# Patient Record
Sex: Male | Born: 1950 | Race: Black or African American | Hispanic: No | Smoking: Former smoker
Health system: Southern US, Community
[De-identification: ages and names within clinical notes are randomized; demographics above are authoritative.]

## PROBLEM LIST (undated history)

## (undated) DIAGNOSIS — IMO0001 Reserved for inherently not codable concepts without codable children: Secondary | ICD-10-CM

## (undated) DIAGNOSIS — G8929 Other chronic pain: Secondary | ICD-10-CM

## (undated) DIAGNOSIS — I739 Peripheral vascular disease, unspecified: Secondary | ICD-10-CM

## (undated) DIAGNOSIS — I428 Other cardiomyopathies: Secondary | ICD-10-CM

## (undated) DIAGNOSIS — J449 Chronic obstructive pulmonary disease, unspecified: Secondary | ICD-10-CM

## (undated) DIAGNOSIS — N183 Chronic kidney disease, stage 3 unspecified: Secondary | ICD-10-CM

## (undated) DIAGNOSIS — G629 Polyneuropathy, unspecified: Secondary | ICD-10-CM

## (undated) DIAGNOSIS — E785 Hyperlipidemia, unspecified: Secondary | ICD-10-CM

## (undated) DIAGNOSIS — R413 Other amnesia: Secondary | ICD-10-CM

## (undated) DIAGNOSIS — M1990 Unspecified osteoarthritis, unspecified site: Secondary | ICD-10-CM

## (undated) DIAGNOSIS — I639 Cerebral infarction, unspecified: Secondary | ICD-10-CM

## (undated) DIAGNOSIS — I10 Essential (primary) hypertension: Secondary | ICD-10-CM

## (undated) DIAGNOSIS — M797 Fibromyalgia: Secondary | ICD-10-CM

## (undated) DIAGNOSIS — I4891 Unspecified atrial fibrillation: Secondary | ICD-10-CM

## (undated) DIAGNOSIS — M109 Gout, unspecified: Secondary | ICD-10-CM

## (undated) DIAGNOSIS — B0230 Zoster ocular disease, unspecified: Secondary | ICD-10-CM

## (undated) DIAGNOSIS — G4730 Sleep apnea, unspecified: Secondary | ICD-10-CM

## (undated) DIAGNOSIS — J45909 Unspecified asthma, uncomplicated: Secondary | ICD-10-CM

## (undated) DIAGNOSIS — K219 Gastro-esophageal reflux disease without esophagitis: Secondary | ICD-10-CM

## (undated) DIAGNOSIS — R531 Weakness: Secondary | ICD-10-CM

## (undated) DIAGNOSIS — E119 Type 2 diabetes mellitus without complications: Secondary | ICD-10-CM

## (undated) DIAGNOSIS — I509 Heart failure, unspecified: Secondary | ICD-10-CM

## (undated) HISTORY — PX: CATARACT EXTRACTION W/ INTRAOCULAR LENS  IMPLANT, BILATERAL: SHX1307

## (undated) HISTORY — PX: JOINT REPLACEMENT: SHX530

## (undated) HISTORY — PX: ORCHIECTOMY: SHX2116

## (undated) HISTORY — PX: TONSILLECTOMY: SUR1361

---

## 2000-06-21 ENCOUNTER — Ambulatory Visit (HOSPITAL_COMMUNITY): Admission: RE | Admit: 2000-06-21 | Discharge: 2000-06-21 | Payer: Self-pay | Admitting: Family Medicine

## 2000-06-21 ENCOUNTER — Encounter: Payer: Self-pay | Admitting: Family Medicine

## 2003-04-20 ENCOUNTER — Emergency Department (HOSPITAL_COMMUNITY): Admission: EM | Admit: 2003-04-20 | Discharge: 2003-04-21 | Payer: Self-pay

## 2006-10-22 HISTORY — PX: CARDIAC CATHETERIZATION: SHX172

## 2013-07-03 ENCOUNTER — Inpatient Hospital Stay (HOSPITAL_COMMUNITY): Payer: Medicare Other

## 2013-07-03 ENCOUNTER — Inpatient Hospital Stay (HOSPITAL_COMMUNITY)
Admission: EM | Admit: 2013-07-03 | Discharge: 2013-07-09 | DRG: 064 | Disposition: A | Discharge status: Skilled Nursing Facility | Payer: Medicare Other | Attending: Internal Medicine | Admitting: Internal Medicine

## 2013-07-03 ENCOUNTER — Emergency Department (HOSPITAL_COMMUNITY): Payer: Medicare Other

## 2013-07-03 ENCOUNTER — Encounter (HOSPITAL_COMMUNITY): Payer: Self-pay | Admitting: Emergency Medicine

## 2013-07-03 DIAGNOSIS — M109 Gout, unspecified: Secondary | ICD-10-CM

## 2013-07-03 DIAGNOSIS — I6350 Cerebral infarction due to unspecified occlusion or stenosis of unspecified cerebral artery: Secondary | ICD-10-CM

## 2013-07-03 DIAGNOSIS — I10 Essential (primary) hypertension: Secondary | ICD-10-CM

## 2013-07-03 DIAGNOSIS — G8929 Other chronic pain: Secondary | ICD-10-CM | POA: Diagnosis present

## 2013-07-03 DIAGNOSIS — R259 Unspecified abnormal involuntary movements: Secondary | ICD-10-CM | POA: Diagnosis present

## 2013-07-03 DIAGNOSIS — G253 Myoclonus: Secondary | ICD-10-CM | POA: Diagnosis present

## 2013-07-03 DIAGNOSIS — IMO0001 Reserved for inherently not codable concepts without codable children: Secondary | ICD-10-CM | POA: Diagnosis present

## 2013-07-03 DIAGNOSIS — R29810 Facial weakness: Secondary | ICD-10-CM | POA: Diagnosis present

## 2013-07-03 DIAGNOSIS — I5033 Acute on chronic diastolic (congestive) heart failure: Secondary | ICD-10-CM

## 2013-07-03 DIAGNOSIS — J4489 Other specified chronic obstructive pulmonary disease: Secondary | ICD-10-CM

## 2013-07-03 DIAGNOSIS — I129 Hypertensive chronic kidney disease with stage 1 through stage 4 chronic kidney disease, or unspecified chronic kidney disease: Secondary | ICD-10-CM | POA: Diagnosis present

## 2013-07-03 DIAGNOSIS — I509 Heart failure, unspecified: Secondary | ICD-10-CM

## 2013-07-03 DIAGNOSIS — J449 Chronic obstructive pulmonary disease, unspecified: Secondary | ICD-10-CM

## 2013-07-03 DIAGNOSIS — I639 Cerebral infarction, unspecified: Secondary | ICD-10-CM

## 2013-07-03 DIAGNOSIS — E119 Type 2 diabetes mellitus without complications: Secondary | ICD-10-CM

## 2013-07-03 DIAGNOSIS — I480 Paroxysmal atrial fibrillation: Secondary | ICD-10-CM | POA: Diagnosis present

## 2013-07-03 DIAGNOSIS — E785 Hyperlipidemia, unspecified: Secondary | ICD-10-CM | POA: Diagnosis present

## 2013-07-03 DIAGNOSIS — M47812 Spondylosis without myelopathy or radiculopathy, cervical region: Secondary | ICD-10-CM | POA: Diagnosis present

## 2013-07-03 DIAGNOSIS — E876 Hypokalemia: Secondary | ICD-10-CM

## 2013-07-03 DIAGNOSIS — M1990 Unspecified osteoarthritis, unspecified site: Secondary | ICD-10-CM | POA: Diagnosis present

## 2013-07-03 DIAGNOSIS — I6340 Cerebral infarction due to embolism of unspecified cerebral artery: Principal | ICD-10-CM | POA: Diagnosis present

## 2013-07-03 DIAGNOSIS — F1910 Other psychoactive substance abuse, uncomplicated: Secondary | ICD-10-CM

## 2013-07-03 DIAGNOSIS — I5022 Chronic systolic (congestive) heart failure: Secondary | ICD-10-CM

## 2013-07-03 DIAGNOSIS — I4891 Unspecified atrial fibrillation: Secondary | ICD-10-CM

## 2013-07-03 DIAGNOSIS — I5023 Acute on chronic systolic (congestive) heart failure: Secondary | ICD-10-CM | POA: Insufficient documentation

## 2013-07-03 DIAGNOSIS — N183 Chronic kidney disease, stage 3 unspecified: Secondary | ICD-10-CM

## 2013-07-03 DIAGNOSIS — E669 Obesity, unspecified: Secondary | ICD-10-CM | POA: Diagnosis present

## 2013-07-03 DIAGNOSIS — I2510 Atherosclerotic heart disease of native coronary artery without angina pectoris: Secondary | ICD-10-CM | POA: Diagnosis present

## 2013-07-03 DIAGNOSIS — Z8249 Family history of ischemic heart disease and other diseases of the circulatory system: Secondary | ICD-10-CM

## 2013-07-03 DIAGNOSIS — R319 Hematuria, unspecified: Secondary | ICD-10-CM | POA: Diagnosis present

## 2013-07-03 DIAGNOSIS — R29898 Other symptoms and signs involving the musculoskeletal system: Secondary | ICD-10-CM | POA: Diagnosis present

## 2013-07-03 DIAGNOSIS — Z833 Family history of diabetes mellitus: Secondary | ICD-10-CM

## 2013-07-03 DIAGNOSIS — N390 Urinary tract infection, site not specified: Secondary | ICD-10-CM | POA: Diagnosis present

## 2013-07-03 DIAGNOSIS — G4733 Obstructive sleep apnea (adult) (pediatric): Secondary | ICD-10-CM | POA: Diagnosis present

## 2013-07-03 DIAGNOSIS — Z8673 Personal history of transient ischemic attack (TIA), and cerebral infarction without residual deficits: Secondary | ICD-10-CM

## 2013-07-03 DIAGNOSIS — I428 Other cardiomyopathies: Secondary | ICD-10-CM

## 2013-07-03 DIAGNOSIS — Z7982 Long term (current) use of aspirin: Secondary | ICD-10-CM

## 2013-07-03 HISTORY — DX: Sleep apnea, unspecified: G47.30

## 2013-07-03 HISTORY — DX: Other chronic pain: G89.29

## 2013-07-03 HISTORY — DX: Chronic kidney disease, stage 3 (moderate): N18.3

## 2013-07-03 HISTORY — DX: Zoster ocular disease, unspecified: B02.30

## 2013-07-03 HISTORY — DX: Gout, unspecified: M10.9

## 2013-07-03 HISTORY — DX: Chronic kidney disease, stage 3 unspecified: N18.30

## 2013-07-03 HISTORY — DX: Chronic obstructive pulmonary disease, unspecified: J44.9

## 2013-07-03 HISTORY — DX: Unspecified atrial fibrillation: I48.91

## 2013-07-03 HISTORY — DX: Other cardiomyopathies: I42.8

## 2013-07-03 HISTORY — DX: Fibromyalgia: M79.7

## 2013-07-03 HISTORY — DX: Hyperlipidemia, unspecified: E78.5

## 2013-07-03 HISTORY — DX: Essential (primary) hypertension: I10

## 2013-07-03 HISTORY — DX: Heart failure, unspecified: I50.9

## 2013-07-03 HISTORY — DX: Unspecified osteoarthritis, unspecified site: M19.90

## 2013-07-03 HISTORY — DX: Polyneuropathy, unspecified: G62.9

## 2013-07-03 LAB — COMPREHENSIVE METABOLIC PANEL
Albumin: 3 g/dL — ABNORMAL LOW (ref 3.5–5.2)
Alkaline Phosphatase: 120 U/L — ABNORMAL HIGH (ref 39–117)
BUN: 25 mg/dL — ABNORMAL HIGH (ref 6–23)
Calcium: 9.9 mg/dL (ref 8.4–10.5)
GFR calc Af Amer: 52 mL/min — ABNORMAL LOW (ref >90)
Potassium: 3.4 mEq/L — ABNORMAL LOW (ref 3.5–5.1)
Sodium: 134 mEq/L — ABNORMAL LOW (ref 135–145)
Total Protein: 7.6 g/dL (ref 6.0–8.3)

## 2013-07-03 LAB — POCT I-STAT TROPONIN I: Troponin i, poc: 0.02 ng/mL (ref 0.00–0.08)

## 2013-07-03 LAB — URINALYSIS, ROUTINE W REFLEX MICROSCOPIC
Bilirubin Urine: NEGATIVE
Glucose, UA: NEGATIVE mg/dL
Hgb urine dipstick: NEGATIVE
Ketones, ur: NEGATIVE mg/dL
Leukocytes, UA: NEGATIVE
Protein, ur: NEGATIVE mg/dL
pH: 7 (ref 5.0–8.0)

## 2013-07-03 LAB — POCT I-STAT, CHEM 8
BUN: 29 mg/dL — ABNORMAL HIGH (ref 6–23)
Calcium, Ion: 0.98 mmol/L — ABNORMAL LOW (ref 1.13–1.30)
Chloride: 99 mEq/L (ref 96–112)
Creatinine, Ser: 1.7 mg/dL — ABNORMAL HIGH (ref 0.50–1.35)
Glucose, Bld: 120 mg/dL — ABNORMAL HIGH (ref 70–99)
TCO2: 23 mmol/L (ref 0–100)

## 2013-07-03 LAB — CBC
MCH: 27.7 pg (ref 26.0–34.0)
MCHC: 33.7 g/dL (ref 30.0–36.0)
MCV: 82.2 fL (ref 78.0–100.0)
Platelets: 322 10*3/uL (ref 150–400)

## 2013-07-03 LAB — GLUCOSE, CAPILLARY
Glucose-Capillary: 122 mg/dL — ABNORMAL HIGH (ref 70–99)
Glucose-Capillary: 108 mg/dL — ABNORMAL HIGH (ref 70–99)
Glucose-Capillary: 103 mg/dL — ABNORMAL HIGH (ref 70–99)

## 2013-07-03 LAB — DIFFERENTIAL
Basophils Relative: 1 % (ref 0–1)
Eosinophils Absolute: 0.1 10*3/uL (ref 0.0–0.7)
Eosinophils Relative: 1 % (ref 0–5)
Neutrophils Relative %: 73 % (ref 43–77)

## 2013-07-03 LAB — PROTIME-INR
INR: 1.23 (ref 0.00–1.49)
Prothrombin Time: 15.2 seconds (ref 11.6–15.2)

## 2013-07-03 LAB — RAPID URINE DRUG SCREEN, HOSP PERFORMED
Amphetamines: NOT DETECTED
Benzodiazepines: NOT DETECTED
Cocaine: NOT DETECTED

## 2013-07-03 LAB — ETHANOL: Alcohol, Ethyl (B): <11 mg/dL (ref 0–11)

## 2013-07-03 LAB — TROPONIN I: Troponin I: <0.3 ng/mL (ref <0.30)

## 2013-07-03 MED ORDER — FUROSEMIDE 40 MG PO TABS: 40.0000 mg | ORAL_TABLET | Freq: Every day | ORAL | Status: DC

## 2013-07-03 MED ORDER — SENNOSIDES-DOCUSATE SODIUM 8.6-50 MG PO TABS
2.0000 | ORAL_TABLET | Freq: Every day | ORAL | Status: DC
  Administered 2013-07-04: 2 via ORAL
  Filled 2013-07-03: qty 2

## 2013-07-03 MED ORDER — PREGABALIN 75 MG PO CAPS
75.0000 mg | ORAL_CAPSULE | Freq: Two times a day (BID) | ORAL | Status: DC
  Administered 2013-07-04 – 2013-07-08 (×5): 75 mg via ORAL
  Filled 2013-07-03 (×8): qty 1

## 2013-07-03 MED ORDER — ACETAMINOPHEN 650 MG RE SUPP: 650.0000 mg | RECTAL | Status: DC | PRN

## 2013-07-03 MED ORDER — GABAPENTIN 300 MG PO CAPS
300.0000 mg | ORAL_CAPSULE | Freq: Two times a day (BID) | ORAL | Status: DC
  Filled 2013-07-03 (×4): qty 1

## 2013-07-03 MED ORDER — OXYCODONE HCL 10 MG PO TB12: 10.0000 mg | ORAL_TABLET | Freq: Two times a day (BID) | ORAL | Status: DC

## 2013-07-03 MED ORDER — ALLOPURINOL 100 MG PO TABS
100.0000 mg | ORAL_TABLET | Freq: Every day | ORAL | Status: DC
  Administered 2013-07-04 – 2013-07-09 (×6): 100 mg via ORAL
  Filled 2013-07-03 (×7): qty 1

## 2013-07-03 MED ORDER — SENNOSIDES-DOCUSATE SODIUM 8.6-50 MG PO TABS: 2.0000 | ORAL_TABLET | Freq: Every day | ORAL | Status: DC

## 2013-07-03 MED ORDER — CARVEDILOL 25 MG PO TABS
25.0000 mg | ORAL_TABLET | Freq: Two times a day (BID) | ORAL | Status: DC
  Administered 2013-07-04 – 2013-07-09 (×10): 25 mg via ORAL
  Filled 2013-07-03 (×14): qty 1

## 2013-07-03 MED ORDER — INSULIN ASPART 100 UNIT/ML ~~LOC~~ SOLN
0.0000 [IU] | Freq: Three times a day (TID) | SUBCUTANEOUS | Status: DC
  Administered 2013-07-04 – 2013-07-05 (×3): 1 [IU] via SUBCUTANEOUS
  Administered 2013-07-05: 2 [IU] via SUBCUTANEOUS
  Administered 2013-07-05: 1 [IU] via SUBCUTANEOUS
  Administered 2013-07-06: 2 [IU] via SUBCUTANEOUS
  Administered 2013-07-06 – 2013-07-07 (×2): 1 [IU] via SUBCUTANEOUS
  Administered 2013-07-07: 2 [IU] via SUBCUTANEOUS
  Administered 2013-07-07: 3 [IU] via SUBCUTANEOUS
  Administered 2013-07-08: 2 [IU] via SUBCUTANEOUS
  Administered 2013-07-08: 1 [IU] via SUBCUTANEOUS
  Administered 2013-07-08: 2 [IU] via SUBCUTANEOUS
  Administered 2013-07-09: 1 [IU] via SUBCUTANEOUS

## 2013-07-03 MED ORDER — INSULIN ASPART 100 UNIT/ML ~~LOC~~ SOLN: 0.0000 [IU] | Freq: Every day | SUBCUTANEOUS | Status: DC

## 2013-07-03 MED ORDER — METOLAZONE 5 MG PO TABS: 5.0000 mg | ORAL_TABLET | Freq: Every day | ORAL | Status: DC

## 2013-07-03 MED ORDER — FUROSEMIDE 40 MG PO TABS
40.0000 mg | ORAL_TABLET | Freq: Every day | ORAL | Status: DC
  Administered 2013-07-04 – 2013-07-05 (×2): 40 mg via ORAL
  Filled 2013-07-03 (×4): qty 1

## 2013-07-03 MED ORDER — ASPIRIN 325 MG PO TABS
325.0000 mg | ORAL_TABLET | Freq: Every day | ORAL | Status: DC
  Administered 2013-07-04 – 2013-07-09 (×6): 325 mg via ORAL
  Filled 2013-07-03 (×6): qty 1

## 2013-07-03 MED ORDER — LEVALBUTEROL HCL 0.63 MG/3ML IN NEBU
0.6300 mg | INHALATION_SOLUTION | RESPIRATORY_TRACT | Status: DC | PRN
  Administered 2013-07-04 – 2013-07-05 (×3): 0.63 mg via RESPIRATORY_TRACT
  Filled 2013-07-03 (×4): qty 3

## 2013-07-03 MED ORDER — POTASSIUM CHLORIDE CRYS ER 20 MEQ PO TBCR
40.0000 meq | EXTENDED_RELEASE_TABLET | Freq: Once | ORAL | Status: DC
  Filled 2013-07-03: qty 2

## 2013-07-03 MED ORDER — OXYCODONE HCL ER 10 MG PO T12A: 10.0000 mg | EXTENDED_RELEASE_TABLET | Freq: Two times a day (BID) | ORAL | Status: DC

## 2013-07-03 MED ORDER — ISOSORBIDE DINITRATE 20 MG PO TABS
20.0000 mg | ORAL_TABLET | Freq: Every day | ORAL | Status: DC
  Administered 2013-07-04 – 2013-07-09 (×6): 20 mg via ORAL
  Filled 2013-07-03 (×7): qty 1

## 2013-07-03 MED ORDER — ACETAMINOPHEN 325 MG PO TABS
650.0000 mg | ORAL_TABLET | ORAL | Status: DC | PRN
  Administered 2013-07-06 – 2013-07-09 (×4): 650 mg via ORAL
  Filled 2013-07-03: qty 2
  Filled 2013-07-03: qty 1
  Filled 2013-07-03 (×3): qty 2

## 2013-07-03 MED ORDER — METOLAZONE 5 MG PO TABS
5.0000 mg | ORAL_TABLET | Freq: Every day | ORAL | Status: DC
  Administered 2013-07-04 – 2013-07-05 (×2): 5 mg via ORAL
  Filled 2013-07-03 (×4): qty 1

## 2013-07-03 MED ORDER — IOHEXOL 350 MG/ML SOLN
100.0000 mL | Freq: Once | INTRAVENOUS | Status: AC | PRN
  Administered 2013-07-03: 100 mL via INTRAVENOUS

## 2013-07-03 MED ORDER — ENOXAPARIN SODIUM 30 MG/0.3ML ~~LOC~~ SOLN
30.0000 mg | SUBCUTANEOUS | Status: DC
Start: 2013-07-03 — End: 2013-07-05
  Administered 2013-07-03 – 2013-07-04 (×2): 30 mg via SUBCUTANEOUS
  Filled 2013-07-03 (×3): qty 0.3

## 2013-07-03 NOTE — ED Notes (Signed)
CBG 122 

## 2013-07-03 NOTE — Code Documentation (Signed)
62 yo male arriving to [facility] by [facility] at 0902.  Code stroke called at 0901.  Patient lives at [facility] d/t CHF and is wheelchair bound per staff, but does use a walker with therapy.  Per nursing home staff, patient was seen at 0630 this morning by a RT who gave him a breathing treatment and thought him to be at his baseline at that time.  Patient was seen again at 0845 when he had difficulty breathing and dropped his oxygen saturations, requiring oxygen.  Staff reports that he had slurred speech and left sided weakness at this time.  He was clammy and CBG was 97.  EMS called and patient transported to the hospital.  Patient reports that he went to bed at midnight, therefore, LKW 8/12 0001.  EDP exam/cleared for CT at 0906, Stroke team arrived at 0904, and neurologist arrived at 0908.  CT showed old L brain strokes, and ill defined hypodensity and edema in the R insular cortex.  NIHSS 8, see documentation for details.  Due to unclear LKW, patient to CTA-CTP at 0945. Awaiting interpretation.  Family updated at bedside. Handoff with ED RN Wes at bedside.

## 2013-07-03 NOTE — Consult Note (Signed)
Neurology Consultation Reason for Consult: Stroke Referring Physician: Merlene Laughter  CC: Left sided weakness  History is obtained from:Patient, nursing home staff  HPI: Frank Davidson is a 62 y.o. male with a history of severe CHF who went to bed normal. He reports waking up normal around 6:30 am, however this is not clear given that he denies his current deficits as well. He had a breathing treatment this morning, though it is unclear how much assessment was actually done at that time and igven that his predominant symptoms are in his leg and he does not recognize his deficits, I do not feel that this is a reliable time of onset. Later, it was noted that he had a left facial droop and weakness.   Speaking with his daughter, he was having some problems with his left leg even prior to todays event and it is unclear if this was weakness or pain.   LKW: 12 midnight tpa given: no, outside of window.  MRS: 4   ROS: A 14 point ROS was performed and is negative except as noted in the HPI.  Past Medical History  Diagnosis Date  . Fibromyalgia   . Atrial fibrillation   . Hypertension   . Osteoarthritis   . COPD (chronic obstructive pulmonary disease)   . Sleep apnea with use of continuous positive airway pressure (CPAP)   . Diabetes mellitus without complication   . Coronary artery disease   . Neuropathy   . Ocular herpes   . Gout   . CKD (chronic kidney disease), stage III   . CHF (congestive heart failure)     EF 15%  . Polysubstance abuse     Hx of  . Chronic pain   . Hyperlipidemia   . Nonischemic cardiomyopathy     Social History: Tob: neg  Exam: Current vital signs: There were no vitals filed for this visit.  Vital signs in last 24 hours:    General: in bed CV: mildly irregular Mental Status: Patient is awake, alert, oriented to person, place, month, year, and situation. He does not recognize his deficits. No aphasia. Cranial Nerves: II: Visual Fields are full.  Pupils are equal, round, and reactive to light.  Discs are difficult to visualize. III,IV, VI: Able to cross midline, but mild restriction in leftward gaze.   V: Facial sensation is symmetric to temperature VII: Facial movement is notable for facial droop VIII: hearing is intact to voice X: Uvula elevates symmetrically XI: Shoulder shrug is symmetric. XII: tongue is midline without atrophy or fasciculations.  Motor: Tone is normal. Bulk is normal. 5/5 strength was present on rthe right, on the left, 3/5 in arm, 1/5 in leg.  Sensory: Sensation is symmetric to light touch and temperature in the arms and legs, but extiguishes to double stim.  Deep Tendon Reflexes: 2+ and symmetric in the biceps and patellae.  Cerebellar: FNF and HKS are intact on right Gait: Unable to test due to weakness.    I have reviewed labs in epic and the results pertinent to this consultation are: Cr 1.7  I have reviewed the images obtained:CTP - some penumbra, but significant infarct as well.   Impression: 62 yo M With right MCA infarct. In the setting of his comorbiditties, I feel that the risks of intervention outweigh the expected benefits based on his CTP, and therefore have no acute interventions to offer. He will need a stroke workup, but I feel this is most likely cardioembolic from his very low  EF.   Recommendations: 1. HgbA1c, fasting lipid panel 2. MRI, MRA  of the brain without contrast 3. Frequent neuro checks 4. Echocardiogram 5. Carotid dopplers 6. Prophylactic therapy-Antiplatelet med: Aspirin - dose 325mg  7. Risk factor modification 8. Telemetry monitoring 9. PT consult, OT consult, Speech consult    This patient is critically ill and at significant risk of neurological worsening, death and care requires constant monitoring of vital signs, hemodynamics,respiratory and cardiac monitoring, neurological assessment, discussion with family, other specialists and medical decision making of high  complexity. I spent 65 minutes of neurocritical care time  in the care of  this patient.  Ritta Slot, MD [facility] [phone]  If 7pm- 7am, please page neurology on call at [phone].  07/03/2013  11:19 AM

## 2013-07-03 NOTE — H&P (Addendum)
[facility] History and Physical  Frank Davidson IHK:[id number] DOB: 1951/02/02 DOA: 07/03/2013  Referring physician: Dr Frank Davidson PCP: Frank Housekeeper, MD  Specialists:   Chief Complaint:   HPI: Frank Davidson is a 62 y.o. 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 Sided weakness and facial droop. He is a [employer] and it was initially reported that when the RT at the facility gave him a breathing treatment at 6:30am today that he was at his 'baseline'...but that RT unable to say whether or not he was able to move his L. Side at that time and pt in unable to tell whether or not he had any weakness or numbness. It was later on this am when another staff saw him that it was noted that he had a left droop and was also weak on the L. Side and so EMS was called. He denies headaches, parasthesias, slurred speech, dysphagia and no changes in his vision. Upon arrival in the ED code stroke was called>>he was seen by Neuro and CT head  ill-defined hypodensity and edema in the right insular cortex compatible with acute infarct. Negative for hemorrhage.CT perfusion  Was done and suggests right MCA infarct core centered at the insula/operculum     Review of Systems: The patient denies anorexia, fever, weight loss,, vision loss, decreased hearing, hoarseness, chest pain, syncope, dyspnea on exertion, peripheral edema, hemoptysis, abdominal pain, melena, hematochezia, severe indigestion/heartburn, hematuria, incontinence, transient blindness, depression, unusual weight change, abnormal bleeding.    Past Medical History  Diagnosis Date  . Fibromyalgia   . Atrial fibrillation   . Hypertension   . Osteoarthritis   . COPD (chronic obstructive pulmonary disease)   . Sleep apnea with use of continuous positive airway pressure (CPAP)   . Diabetes mellitus without complication   . Coronary artery disease   . Neuropathy   .  Ocular herpes   . Gout   . CKD (chronic kidney disease), stage III   . CHF (congestive heart failure)     EF 15%  . Polysubstance abuse     Hx of  . Chronic pain   . Hyperlipidemia   . Nonischemic cardiomyopathy    Past Surgical History  Procedure Laterality Date  . 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 Cardiac catheterization  10/2006    normal coronary arteries   Social History:  reports that he has never smoked. He does not have any smokeless tobacco history on file. He reports that he does not drink alcohol or use illicit drugs.  where does patient live--home  Allergies  Allergen Reactions  . Actos (Pioglitazone)     Family history: Sister with HTN and DM  Prior to Admission medications   Medication Sig Start Date End Date Taking? Authorizing Provider  allopurinol (ZYLOPRIM) 100 MG tablet Take 100 mg by mouth daily.   Yes Historical Provider, MD  aspirin 325 MG tablet Take 325 mg by mouth daily.   Yes Historical Provider, MD  carvedilol (COREG) 25 MG tablet Take 25 mg by mouth 2 (two) times daily with a meal.   Yes Historical Provider, MD  furosemide (LASIX) 40 MG tablet Take 40 mg by mouth daily.   Yes Historical Provider, MD  gabapentin (NEURONTIN) 300 MG capsule Take 300 mg by mouth 2 (two) times daily.   Yes Historical Provider, MD  glipiZIDE (GLUCOTROL) 10 MG tablet Take 10 mg by mouth 2 (two) times  daily before a meal.   Yes Historical Provider, MD  isosorbide dinitrate (ISORDIL) 20 MG tablet Take 20 mg by mouth daily.   Yes Historical Provider, MD  metolazone (ZAROXOLYN) 5 MG tablet Take 5 mg by mouth daily.   Yes Historical Provider, MD  oxyCODONE (OXYCONTIN) 10 MG 12 hr tablet Take 10 mg by mouth every 12 (twelve) hours.   Yes Historical Provider, MD  pregabalin (LYRICA) 75 MG capsule Take 75 mg by mouth 2 (two) times daily.   Yes Historical Provider, MD  senna-docusate (SENOKOT-S) 8.6-50 MG per tablet Take 2 tablets by mouth daily.   Yes Historical Provider, MD    Physical Exam: Filed Vitals:   07/03/13 1130  BP: 130/80  Pulse: 65  Resp: 18    Constitutional: Vital signs reviewed.  Patient is a well-developed and well-nourished in no acute distress and cooperative with exam. Alert and oriented x1. L. Facial droop Head: Normocephalic and atraumatic Nose: No erythema or drainage noted.   Mouth: no erythema or exudates, MMM Eyes: PERRL, EOMI, conjunctivae normal, No scleral icterus.  Neck: Supple, Trachea midline normal ROM, No JVD, mass, thyromegaly, or carotid bruit present.  Cardiovascular: Irreg, irreg, S1 normal, S2 normal, no MRG, pulses symmetric and intact bilaterally Pulmonary/Chest: normal respiratory effort, CTAB, no wheezes, rales, or rhonchi Abdominal: Soft. Non-tender, non-distended, bowel sounds are normal, no masses, organomegaly, or guarding present.  GU: no CVA tenderness Extremities:No cyanosis and no edema Neurological: A&O x1, LUE strength 3/5, LLE 1/5. R.sided strenght 5/5, cranial nerve II-XII are grossly intact, sensory grossly intact.   Skin: Warm, dry and intact. No rash, cyanosis, or clubbing.  Psychiatric: Normal mood and affect.     Labs on Admission:  Basic Metabolic Panel:  Recent Labs Lab 07/03/13 0924 07/03/13 0927  NA 134* 134*  K 4.3 3.4*  CL 99 94*  CO2  --  28  GLUCOSE 120* 122*  BUN 29* 25*  CREATININE 1.70* 1.59*  CALCIUM  --  9.9   Liver Function Tests:  Recent Labs Lab 07/03/13 0927  AST 33  ALT 26  ALKPHOS 120*  BILITOT 0.9  PROT 7.6  ALBUMIN 3.0*   No results found for this basename: LIPASE, AMYLASE,  in the last 168 hours No results found for this basename: AMMONIA,  in the last 168 hours CBC:  Recent Labs Lab 07/03/13 0924 07/03/13 0927  WBC  --  5.8  NEUTROABS  --  4.2  HGB 14.3 12.0*  HCT 42.0 35.6*  MCV  --  82.2  PLT  --  322   Cardiac Enzymes:  Recent Labs Lab 07/03/13 0933  TROPONINI <0.30    BNP (last 3 results) No results found for this basename:  PROBNP,  in the last 8760 hours CBG:  Recent Labs Lab 07/03/13 1036  GLUCAP 122*    Radiological Exams on Admission: Ct Head Wo Contrast  07/03/2013   *RADIOLOGY REPORT*  Clinical Data: Code stroke.  Left facial droop  CT HEAD WITHOUT CONTRAST  Technique:  Contiguous axial images were obtained from the base of the skull through the vertex without contrast.  Comparison: None.  Findings: Poorly defined hypodensity in the right insula, consistent with acute infarct in the right MCA territory.  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 Chronic infarct in the posterior internal capsule on the left.  Age appropriate atrophy.  Negative for hydrocephalus.  Negative for hemorrhage or mass.  IMPRESSION: Ill-defined hypodensity and edema in the right  insular cortex compatible with acute infarct.  Negative for hemorrhage.  Critical Value/emergent results were called by telephone at the time of interpretation on 07/03/2013 at 9:36 hours to Dr. Petra Kuba, who verbally acknowledged these results.   Original Report Authenticated By: Janeece Riggers, M.D.    Assessment/Plan Present on Admission:  . CVA (cerebral infarction) - As discussed above, will admit for CVA work up - continue ASA as per neuro recs -obtain MRI/MRA/carotid dopplers, A1C, FLP and follow -appreciate Neuro assistance, following . A-fib -continue coreg and ASA -Neuro to follow for further recs on anticoagulation -rate controlled, follow . Nonischemic cardiomyopathy/CHF -compensated, follow and resume diuretics in am -continue coreg, follow up on echo for ef . Hypertension -monitor and allow for permissive HTN in first couple of days . COPD (chronic obstructive pulmonary disease) -stable, prn nebs . CKD (chronic kidney disease), stage III -follow and recheck . Diabetes mellitus without complication -monitor accuchecks and cover with SSI for now . Hypokalemia -replace k      Code Status: full Family  Communication: niece at bedside Disposition Plan: admit to neuro tele  Time spent: >30  Kela Millin [facility] Pager [phone]  If 7PM-7AM, please contact night-coverage [email or web address] Password [facility] 07/03/2013, 11:46 AM

## 2013-07-03 NOTE — ED Notes (Signed)
Per report from [facility] pt is a resident at [facility].  Pt reportedly received a breathing tx at the facility this am at 0630.  The resp therapist at the facility reports that the pt appeared normal this am.  He is noted to have L sided weakness in the upper and lower extremity weakness with L sided facial droop.  Staff reported that the patient was noted to have a respiratory event around 0815 in which he became diaphoretic and weak.  Staff also reported a normal EKG.  Pt is alert and oriented.

## 2013-07-03 NOTE — Progress Notes (Signed)
Dr. Donna Bernard notified of continued npo status and inability to give po meds. Will monitor.

## 2013-07-03 NOTE — Evaluation (Signed)
Clinical/Bedside Swallow Evaluation Patient Details  Name: Frank Davidson MRN: [id number] Date of Birth: 1951/07/10  Today's Date: 07/03/2013 Time: 1530-1610 SLP Time Calculation (min): 40 min  Past Medical History:  Past Medical History  Diagnosis Date  . Fibromyalgia   . Atrial fibrillation   . Hypertension   . Osteoarthritis   . COPD (chronic obstructive pulmonary disease)   . Sleep apnea with use of continuous positive airway pressure (CPAP)   . Diabetes mellitus without complication   . Coronary artery disease   . Neuropathy   . Ocular herpes   . Gout   . CKD (chronic kidney disease), stage III   . CHF (congestive heart failure)     EF 15%  . Polysubstance abuse     Hx of  . Chronic pain   . Hyperlipidemia   . Nonischemic cardiomyopathy    Past Surgical History:  Past Surgical History  Procedure Laterality Date  . 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 Cardiac catheterization  10/2006    normal coronary arteries   HPI:  Frank Davidson is a 62 y.o. 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 Sided weakness and facial droop. He is a [employer] and it was initially reported that when the RT at the facility gave him a breathing treatment at 6:30am today that he was at his 'baseline'...but that RT unable to say whether or not he was able to move his L. Side at that time and pt in unable to tell whether or not he had any weakness or numbness. It was later on this am when another staff saw him that it was noted that he had a left droop and was also weak on the L. Side and so EMS was called. He denies headaches, parasthesias, slurred speech, dysphagia and no changes in his vision. Upon arrival in the ED code stroke was called>>he was seen by Neuro and CT head  ill-defined hypodensity and edema in the right insular cortex compatible with acute infarct. Negative for hemorrhage.CT perfusion  Was done and  suggests right MCA infarct core centered at the insula/operculum   Assessment / Plan / Recommendation Clinical Impression  Pt. is very sleepy.  Will awaken briefly and accepts po's by spoon, but has decreased oral control for bolus formation and propulsion, requiring max verbal and tactile cues to move bolus posteriorly for swallow.  Pt. had a delayed cough after each consistency given, including honey thick liquids and puree.  Recommend NPO with f/u trials of puree and thickened liquids in am.  Pt. will need a MBS or FEES prior to initiating diet.  SLP will reassess for readiness for objective evaluation 8/13.    Aspiration Risk  Severe    Diet Recommendation NPO   Medication Administration: Via alternative means    Other  Recommendations Recommended Consults: MBS;FEES Oral Care Recommendations: Oral care QID;Staff/trained caregiver to provide oral care Other Recommendations: Have oral suction available   Follow Up Recommendations  Skilled Nursing facility;24 hour supervision/assistance    Frequency and Duration  (Defer treatment plan until MBS or FEES [place] completed.)      Pertinent Vitals/Pain n/a    SLP Swallow Goals     Swallow Study Prior Functional Status       General Date of Onset: 07/03/13 HPI: Frank Davidson is a 62 y.o. male with h/o Afib, HTN, DM, nonischemic cardiomyopathy/CHF last EF 15%, CKD stage  3, who presents with L. Sided weakness and facial droop. He is a [employer] and it was initially reported that when the RT at the facility gave him a breathing treatment at 6:30am today that he was at his 'baseline'...but that RT unable to say whether or not he was able to move his L. Side at that time and pt in unable to tell whether or not he had any weakness or numbness. It was later on this am when another staff saw him that it was noted that he had a left droop and was also weak on the L. Side and so EMS was called. He denies headaches, parasthesias,  slurred speech, dysphagia and no changes in his vision. Upon arrival in the ED code stroke was called>>he was seen by Neuro and CT head  ill-defined hypodensity and edema in the right insular cortex compatible with acute infarct. Negative for hemorrhage.CT perfusion  Was done and suggests right MCA infarct core centered at the insula/operculum Type of Study: Bedside swallow evaluation Previous Swallow Assessment: None Diet Prior to this Study: NPO Temperature Spikes Noted: No Respiratory Status: Room air History of Recent Intubation: No Behavior/Cognition: Lethargic;Requires cueing;Decreased sustained attention Oral Cavity - Dentition: Dentures, not available (Has lower teeth, but no uppers) Self-Feeding Abilities: Total assist Patient Positioning: Upright in bed Baseline Vocal Quality: Clear Volitional Cough: Cognitively unable to elicit Volitional Swallow: Able to elicit    Oral/Motor/Sensory Function Overall Oral Motor/Sensory Function: Impaired Labial ROM: Reduced left Labial Symmetry: Abnormal symmetry left Labial Strength: Reduced Labial Sensation: Reduced Lingual ROM: Reduced right;Reduced left Lingual Strength: Reduced Lingual Sensation: Reduced Facial ROM: Reduced left Facial Symmetry: Left droop Facial Strength: Reduced Facial Sensation: Reduced Velum:  (Unable to assess) Mandible: Within Functional Limits   Ice Chips Ice chips: Impaired Presentation: Spoon Oral Phase Impairments: Reduced lingual movement/coordination;Impaired anterior to posterior transit;Poor awareness of bolus Oral Phase Functional Implications: Prolonged oral transit;Oral holding;Oral residue Pharyngeal Phase Impairments: Suspected delayed Swallow;Decreased hyoid-laryngeal movement;Cough - Delayed   Thin Liquid Thin Liquid: Not tested    Nectar Thick Nectar Thick Liquid: Not tested   Honey Thick Honey Thick Liquid: Impaired Presentation: Spoon Oral Phase Impairments: Reduced labial seal;Reduced  lingual movement/coordination;Impaired anterior to posterior transit;Poor awareness of bolus Oral Phase Functional Implications: Prolonged oral transit;Oral holding;Oral residue Pharyngeal Phase Impairments: Suspected delayed Swallow;Cough - Delayed   Puree Puree: Impaired Presentation: Spoon Oral Phase Impairments: Reduced labial seal;Reduced lingual movement/coordination;Impaired anterior to posterior transit;Poor awareness of bolus Oral Phase Functional Implications: Prolonged oral transit;Oral residue;Oral holding Pharyngeal Phase Impairments: Suspected delayed Swallow;Cough - Delayed   Solid   GO    Solid: Not tested       Maryjo Rochester T 07/03/2013,4:41 PM

## 2013-07-03 NOTE — ED Provider Notes (Signed)
CSN: [id number]     Arrival date & time 07/03/13  0906 History     First MD Initiated Contact with Patient 07/03/13 [phone]     Chief Complaint  Patient presents with  . Code Stroke   The history is provided by the EMS personnel and the nursing home. The history is limited by the condition of the patient (aphasia).  Pt was seen at 0930.  Pt seen on arrival to ED exam room with Code Stroke Team already at bedside.  Pt from [facility]. Pt was found by staff this morning with left sided weakness, facial droop and slurred speech. NH also states pt was diaphoretic and "weak."  LSN possibly this morning during his resp treatment at 0630, but full neuro exam was not completed by NH staff at that time. Pt otherwise awake/alert.    Past Medical History  Diagnosis Date  . Fibromyalgia   . Atrial fibrillation   . Hypertension   . Osteoarthritis   . COPD (chronic obstructive pulmonary disease)   . Sleep apnea with use of continuous positive airway pressure (CPAP)   . Diabetes mellitus without complication   . Coronary artery disease   . Neuropathy   . Ocular herpes   . Gout   . CKD (chronic kidney disease), stage III   . CHF (congestive heart failure)     EF 15%  . Polysubstance abuse     Hx of  . Chronic pain   . Hyperlipidemia   . Nonischemic cardiomyopathy    Past Surgical History  Procedure Laterality Date  . 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 Cardiac catheterization  10/2006    normal coronary arteries    History  Substance Use Topics  . Smoking status: Never Smoker   . Smokeless tobacco: Not on file  . Alcohol Use: No    Review of Systems  Unable to perform ROS: Patient nonverbal    Allergies  Review of patient's allergies indicates not on file.  Home Medications  No current outpatient prescriptions on file.   BP 130/80  Pulse 65  Resp 18  SpO2 98%  Physical Exam 0935: Physical examination:  Nursing notes reviewed; Vital signs and O2 SAT  reviewed;  Constitutional: Well developed, Well nourished, In no acute distress; Head:  Normocephalic, atraumatic; Eyes: EOMI, PERRL, No scleral icterus; ENMT: Mouth and pharynx normal, Mucous membranes dry; Neck: Supple, Full range of motion, No lymphadenopathy; Cardiovascular: Regular rate and rhythm, No gallop; Respiratory: Breath sounds clear & equal bilaterally, No wheezes.  Speaking full sentences with ease, Normal respiratory effort/excursion; Chest: Nontender, Movement normal; Abdomen: Soft, Nontender, Nondistended, Normal bowel sounds; Extremities: Pulses normal, No tenderness, No edema, No calf edema or asymmetry.; Neuro: Awake, alert, speech slurred, left facial droop. LUE and LLE weakness with left sided extinction. .; Skin: Color normal, Warm, Dry.   ED Course   0935:  Neuro Dr. Amada Jupiter at bedside. NIH scale 8. LSN unknown (unreliable hx), possibly midnight last night. Neuro MD will take pt to MRI.  1130:  Neuro Dr. Amada Jupiter back in the ED with pt after CT-A. States pt +acute stroke, but no acute intervention at this time, please admit to medicine service. T/C to [facility] Dr. Suanne Marker, case discussed, including:  HPI, pertinent PM/SHx, VS/PE, dx testing, ED course and treatment:  Agreeable to inpatient admit, requests to write temporary orders, obtain tele bed to team 10.   Procedures     MDM  MDM Reviewed:  previous chart, nursing note and vitals Interpretation: labs, ECG, x-ray and CT scan Total time providing critical care: 30-74 minutes. This excludes time spent performing separately reportable procedures and services. Consults: neurology   CRITICAL CARE Performed by: Laray Anger Total critical care time: 35 Critical care time was exclusive of separately billable procedures and treating other patients. Critical care was necessary to treat or prevent imminent or life-threatening deterioration. Critical care was time spent personally by me on the following  activities: development of treatment plan with patient and/or surrogate as well as nursing, discussions with consultants, evaluation of patient's response to treatment, examination of patient, obtaining history from patient or surrogate, ordering and performing treatments and interventions, ordering and review of laboratory studies, ordering and review of radiographic studies, pulse oximetry and re-evaluation of patient's condition.    Date: 07/03/2013  Rate: 80  Rhythm: atrial fibrillation, artifact  QRS Axis: left  Intervals: normal  ST/T Wave abnormalities: nonspecific ST/T changes  Conduction Disutrbances:nonspecific intraventricular conduction delay  Narrative Interpretation:   Old EKG Reviewed: none available  Results for orders placed during the hospital encounter of 07/03/13  ETHANOL      Result Value Range   Alcohol, Ethyl (B) <11  0 - 11 mg/dL  PROTIME-INR      Result Value Range   Prothrombin Time 15.2  11.6 - 15.2 seconds   INR 1.23  0.00 - 1.49  APTT      Result Value Range   aPTT 39 (*) 24 - 37 seconds  CBC      Result Value Range   WBC 5.8  4.0 - 10.5 K/uL   RBC 4.33  4.22 - 5.81 MIL/uL   Hemoglobin 12.0 (*) 13.0 - 17.0 g/dL   HCT 16.1 (*) 09.6 - 04.5 %   MCV 82.2  78.0 - 100.0 fL   MCH 27.7  26.0 - 34.0 pg   MCHC 33.7  30.0 - 36.0 g/dL   RDW 40.9 (*) 81.1 - 91.4 %   Platelets 322  150 - 400 K/uL  DIFFERENTIAL      Result Value Range   Neutrophils Relative % 73  43 - 77 %   Neutro Abs 4.2  1.7 - 7.7 K/uL   Lymphocytes Relative 14  12 - 46 %   Lymphs Abs 0.8  0.7 - 4.0 K/uL   Monocytes Relative 11  3 - 12 %   Monocytes Absolute 0.6  0.1 - 1.0 K/uL   Eosinophils Relative 1  0 - 5 %   Eosinophils Absolute 0.1  0.0 - 0.7 K/uL   Basophils Relative 1  0 - 1 %   Basophils Absolute 0.1  0.0 - 0.1 K/uL  COMPREHENSIVE METABOLIC PANEL      Result Value Range   Sodium 134 (*) 135 - 145 mEq/L   Potassium 3.4 (*) 3.5 - 5.1 mEq/L   Chloride 94 (*) 96 - 112 mEq/L    CO2 28  19 - 32 mEq/L   Glucose, Bld 122 (*) 70 - 99 mg/dL   BUN 25 (*) 6 - 23 mg/dL   Creatinine, Ser 7.82 (*) 0.50 - 1.35 mg/dL   Calcium 9.9  8.4 - 95.6 mg/dL   Total Protein 7.6  6.0 - 8.3 g/dL   Albumin 3.0 (*) 3.5 - 5.2 g/dL   AST 33  0 - 37 U/L   ALT 26  0 - 53 U/L   Alkaline Phosphatase 120 (*) 39 - 117 U/L   Total Bilirubin  0.9  0.3 - 1.2 mg/dL   GFR calc non Af Amer 45 (*) >90 mL/min   GFR calc Af Amer 52 (*) >90 mL/min  TROPONIN I      Result Value Range   Troponin I <0.30  <0.30 ng/mL  GLUCOSE, CAPILLARY      Result Value Range   Glucose-Capillary 122 (*) 70 - 99 mg/dL   Comment 1 Documented in Chart     Comment 2 Notify RN    POCT I-STAT, CHEM 8      Result Value Range   Sodium 134 (*) 135 - 145 mEq/L   Potassium 4.3  3.5 - 5.1 mEq/L   Chloride 99  96 - 112 mEq/L   BUN 29 (*) 6 - 23 mg/dL   Creatinine, Ser 4.09 (*) 0.50 - 1.35 mg/dL   Glucose, Bld 811 (*) 70 - 99 mg/dL   Calcium, Ion 9.14 (*) 1.13 - 1.30 mmol/L   TCO2 23  0 - 100 mmol/L   Hemoglobin 14.3  13.0 - 17.0 g/dL   HCT 78.2  95.6 - 21.3 %  POCT I-STAT TROPONIN I      Result Value Range   Troponin i, poc 0.02  0.00 - 0.08 ng/mL   Comment 3            Ct Head Wo Contrast 07/03/2013   *RADIOLOGY REPORT*  Clinical Data: Code stroke.  Left facial droop  CT HEAD WITHOUT CONTRAST  Technique:  Contiguous axial images were obtained from the base of the skull through the vertex without contrast.  Comparison: None.  Findings: Poorly defined hypodensity in the right insula, consistent with acute infarct in the right MCA territory.  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 Chronic infarct in the posterior internal capsule on the left.  Age appropriate atrophy.  Negative for hydrocephalus.  Negative for hemorrhage or mass.  IMPRESSION: Ill-defined hypodensity and edema in the right insular cortex compatible with acute infarct.  Negative for hemorrhage.  Critical Value/emergent results were called by  telephone at the time of interpretation on 07/03/2013 at 9:36 hours to Dr. Petra Kuba, who verbally acknowledged these results.   Original Report Authenticated By: Janeece Riggers, M.D.    Results for Frank Davidson, Frank Davidson (MRN [id number]) as of 07/03/2013 11:34  Ref. Range 07/03/2013 09:24 07/03/2013 09:27  BUN Latest Range: 6-23 mg/dL 29 (H) 25 (H)  Creatinine Latest Range: 0.50-1.35 mg/dL 6.29 (H) 5.28 (H)     Laray Anger, DO 07/05/13 1630

## 2013-07-04 DIAGNOSIS — I059 Rheumatic mitral valve disease, unspecified: Secondary | ICD-10-CM

## 2013-07-04 DIAGNOSIS — I509 Heart failure, unspecified: Secondary | ICD-10-CM

## 2013-07-04 LAB — BASIC METABOLIC PANEL
BUN: 18 mg/dL (ref 6–23)
CO2: 30 mEq/L (ref 19–32)
Chloride: 96 mEq/L (ref 96–112)
GFR calc Af Amer: 70 mL/min — ABNORMAL LOW (ref >90)
Potassium: 3.5 mEq/L (ref 3.5–5.1)

## 2013-07-04 LAB — GLUCOSE, CAPILLARY
Glucose-Capillary: 136 mg/dL — ABNORMAL HIGH (ref 70–99)
Glucose-Capillary: 135 mg/dL — ABNORMAL HIGH (ref 70–99)

## 2013-07-04 LAB — HEMOGLOBIN A1C: Hgb A1c MFr Bld: 6 % — ABNORMAL HIGH (ref <5.7)

## 2013-07-04 LAB — LIPID PANEL
HDL: 27 mg/dL — ABNORMAL LOW (ref >39)
LDL Cholesterol: 55 mg/dL (ref 0–99)

## 2013-07-04 MED ORDER — FUROSEMIDE 40 MG PO TABS: 40.0000 mg | ORAL_TABLET | Freq: Every day | ORAL | Status: DC

## 2013-07-04 NOTE — Progress Notes (Signed)
Echo Lab  2D Echocardiogram completed.  Jewelz Ricklefs L Arnetta Odeh, RDCS 07/04/2013 9:03 AM

## 2013-07-04 NOTE — Progress Notes (Signed)
Speech Language Pathology Dysphagia Treatment Patient Details Name: Frank Davidson MRN: [id number] DOB: 12/08/1950 Today's Date: 07/04/2013 Time: 1015-     Assessment / Plan / Recommendation Clinical Impression  Pt. more alert this am and eager to eat.  No overt s/s of aspiration with any consistencies given.  Pt. denies laryngeal residue.  Swallow was triggered in a timely manner, good laryngeal elevation palpated, no cough or audible throat clearing, and voice was clear after swallows. Pt. did have frequent belching after swallow of water, but reports this is his baseline.  Expiratory wheezed was noted after po's given.  Discussed with Neurologist.    Diet Recommendation  Initiate / Change Diet: Dysphagia 2 (fine chop);Thin liquid    SLP Plan Goals updated   Pertinent Vitals/Pain n/a   Swallowing Goals  SLP Swallowing Goals Patient will consume recommended diet without observed clinical signs of aspiration with: Minimal assistance Patient will utilize recommended strategies during swallow to increase swallowing safety with: Minimal assistance  General Temperature Spikes Noted: No Behavior/Cognition: Alert;Cooperative;Pleasant mood;Requires cueing;Decreased sustained attention Oral Cavity - Dentition: Dentures, not available Patient Positioning: Upright in bed  Oral Cavity - Oral Hygiene Does patient have any of the following "at risk" factors?: None of the above Brush patient's teeth BID with toothbrush (using toothpaste with fluoride): Yes Patient is HIGH RISK - Oral Care Protocol followed (see row info): Yes   Dysphagia Treatment Treatment focused on: Upgraded PO texture trials;Patient/family/caregiver education Family/Caregiver Educated: Daughter Treatment Methods/Modalities: Skilled observation Patient observed directly with PO's: Yes Type of PO's observed: Dysphagia 3 (soft);Thin liquids;Nectar-thick liquids;Honey-thick liquids;Ice chips Feeding: Able to feed self;Needs  set up Liquids provided via: Teaspoon;Cup;Straw Oral Phase Signs & Symptoms:  (Impulsive (placed whole cracker in mouth)) Pharyngeal Phase Signs & Symptoms: Changes in respirations (Expiratory wheeze noted after po's given.) Type of cueing: Verbal Amount of cueing: Minimal   GO     Maryjo Rochester T 07/04/2013, 10:48 AM

## 2013-07-04 NOTE — Progress Notes (Addendum)
[facility] PROGRESS NOTE  Frank Davidson AOZ:[id number] DOB: December 05, 1950 DOA: 07/03/2013 PCP: Georgann Housekeeper, MD   Brief narrative 62 year old obese male with history of A. fib, hypertension, diabetes mellitus, nonischemic cardiomyopathy with EF of 15%, CT D. Steege the stent from skilled nursing facility with acute onset of left-sided weakness and facial droop. CT head suggestive of right insular infarct. No TPA given in the ED as patient was out of TPA window..  Assessment/Plan: Acute right sided infarct Mild brain shows "Moderate to large-sized right hemispheric acute infarct extends from the right opercular/sub insular region into the right frontal lobe bordering the anterior aspect of the right parietal lobe." Patient has residual left-sided weakness. Continue aspirin 325 mg daily. Will start on coumadin given hx of afib with high CHADS2 score of 5. Not on statin as LDL at goal. Hemoglobin A1c pending. continue PT /OT  A. fib Continue chordae and aspirin. Would add Coumadin  Nonischemic cardiomyopathy Appears compensated. Has some leg edema. Resume chordae and Lasix. EF of 20-25 % on 2D echo with  diffuse hypokinesis.  Myoclonic jerks of legs Unclear duration. D/ced neurontin and oxycodone   CKD stage 3 Renal function stable  Diabetes mellitus Continue sliding-scale insulin  COPD Continue when necessary nebs   Code Status: Full code Family Communication: Son and daughter at bedside  Disposition Plan: Return to skilled nursing facility once workup completed. Likely in  1-2 days   Consultants:  Neurology  Procedures:  None  Antibiotics:  None  HPI/Subjective: Patient seen and examined this morning. Family at bedside  Objective: Filed Vitals:   07/04/13 1809  BP: 117/67  Pulse: 73  Temp: 98.2 F (36.8 C)  Resp: 20    Intake/Output Summary (Last 24 hours) at 07/04/13 1850 Last data filed at 07/04/13 1826  Gross per 24 hour  Intake    480 ml   Output   1175 ml  Net   -695 ml   There were no vitals filed for this visit.  Exam:   General:  Middle aged obese male in no acute distress  HEENT: No pallor, moist oral mucosa  Chest: Clear to auscultation bilaterally, no added sounds  CVS: Normal S1-S2, no murmurs rub or gallop  Abdomen: Soft, nontender, nondistended, bowel sounds present  Extremities: Trace edema  CNS: AAO x3 4 x 5 overt left extremity.    Data Reviewed: Basic Metabolic Panel:  Recent Labs Lab 07/03/13 0924 07/03/13 0927 07/04/13 1235  NA 134* 134* 136  K 4.3 3.4* 3.5  CL 99 94* 96  CO2  --  28 30  GLUCOSE 120* 122* 135*  BUN 29* 25* 18  CREATININE 1.70* 1.59* 1.24  CALCIUM  --  9.9 9.4   Liver Function Tests:  Recent Labs Lab 07/03/13 0927  AST 33  ALT 26  ALKPHOS 120*  BILITOT 0.9  PROT 7.6  ALBUMIN 3.0*   No results found for this basename: LIPASE, AMYLASE,  in the last 168 hours No results found for this basename: AMMONIA,  in the last 168 hours CBC:  Recent Labs Lab 07/03/13 0924 07/03/13 0927  WBC  --  5.8  NEUTROABS  --  4.2  HGB 14.3 12.0*  HCT 42.0 35.6*  MCV  --  82.2  PLT  --  322   Cardiac Enzymes:  Recent Labs Lab 07/03/13 0933  TROPONINI <0.30   BNP (last 3 results) No results found for this basename: PROBNP,  in the last 8760 hours CBG:  Recent Labs Lab  07/03/13 1036 07/03/13 1826 07/03/13 2125 07/04/13 0642 07/04/13 1143  GLUCAP 122* 108* 103* 100* 136*    No results found for this or any previous visit (from the past 240 hour(s)).   Studies: Ct Angio Head W/cm &/or Wo Cm  07/03/2013   *RADIOLOGY REPORT*  Clinical Data: 62 year old male Code stroke with left facial droop, right MCA territory symptoms.  Known decreased cardiac ejection fraction.  Contrast:  100 ml Omnipaque 350 (50 ml for attempted CTA, 50 ml for subsequent CT perfusion).  Comparison: Head CT without contrast 07/03/2013.  CT CEREBRAL PERFUSION WITH CONTRAST  Technique:  Multidetector CT brain perfusion study performed during bolus IV contrast administration. Data analysis and post processing performed.   Findings: Suboptimal but felt to be diagnostic arterial input function.  Good venous input function. Perfusion maps demonstrate decreased cerebral blood flow and cerebral blood volume in the right MCA territory centered at the insula and operculum.  ROI measurements in the right MCA / opercular region reveal: - Cerebral blood volume 1.8 mL/100g (versus 3.20 left MCA) - Cerebral blood flow 7.1 mL/100g/min (versus 26.3 left MCA) - Mean transit time 20.2 seconds (versus 12.4 seconds) - Time to peak 23.9 seconds. (versus a 36.3 seconds).  IMPRESSION: 1.  CT perfusion suggests right MCA infarct core centered at the insula/operculum. Findings reviewed in person with Dr. Onalee Hua at 1045 hours on 07/03/2013. 2.  Questionable benign oligemia in the contralateral left MCA territory, versus artifact due to cross suboptimal arterial input function. 3. Attempted CTA head and neck report is below.  CT ANGIOGRAPHY HEAD AND NECK  Technique:  Multidetector CT imaging of the head and neck was performed using the standard protocol during bolus administration of intravenous contrast.  Multiplanar CT image reconstructions including MIPs were obtained to evaluate the vascular anatomy. Carotid stenosis measurements (when applicable) are obtained utilizing NASCET criteria, using the distal internal carotid diameter as the denominator.  CTA NECK  Findings:  Early contrast bolus timing, such that there is no aortic arch or carotid/vertebral artery contrast at the time of image acquisition. After discussing with Dr. Amada Jupiter, decision was made to defer any repeat CTA attempt and proceed with a CT brain perfusion study (report above).  Mild ground-glass opacity in the visualized upper lobes. Mediastinal lipomatosis.  Small superior mediastinal lymph nodes are within normal limits.  Atelectatic  changes to the trachea. Negative thyroid.  Larynx and pharynx within normal limits. Parapharyngeal, retropharyngeal (incidental bilateral carotid artery retropharyngeal course), and sublingual spaces are within normal limits.  Negative submandibular and parotid glands. Postoperative changes to the orbits.  Chronic right lamina papyracea fracture. Visualized paranasal sinuses and mastoids are clear.  No cervical lymphadenopathy identified.  Widespread degenerative changes in the cervical spine with disc space loss, occasional vacuum disc phenomena, and endplate spurring. No acute osseous abnormality identified.  Mild calcified atherosclerosis of the aortic arch.  Mild calcified atherosclerosis of the proximal great vessels.  In adequate intraarterial contrast timing in the neck.  Calcified atherosclerosis of both carotid bifurcations.   Review of the MIP images confirms the above findings.  IMPRESSION: 1.  Inadequate intraarterial contrast bolus timing.  Calcified atherosclerosis in the neck.   After discussing with Dr. Amada Jupiter, decision was made to defer any repeat CTA attempt and proceed with a CT brain perfusion study (reported above). Incidental retropharyngeal course of both cervical carotid arteries. 2.  See intracranial findings below. 3.  Widespread cervical spine degeneration.  CTA HEAD  Findings:  Postcontrast images of  the head redemonstrated left MCA territory chronic encephalomalacia.  No abnormal hyperenhancement. No intracranial mass effect.  No ventriculomegaly.  Hypodensity at the right insula re-identified.  Decreased visualization of the right MCA M1 segment compared to the left.  Major intracranial venous structures appear to be normally enhancing.  Calvarium intact. Visualized scalp soft tissues are within normal limits.  Extensive left greater than right ICA siphon calcified atherosclerosis.  In adequate intraarterial contrast timing.   Review of the MIP images confirms the above findings.   IMPRESSION: 1.  Evidence of acute cortically based infarct at the right insula. No mass effect or hemorrhage.  Postcontrast images do demonstrate decreased visualization of the right MCA M1 segment.  2.  Inadequate intraarterial contrast timing.  Left worse than right widespread ICA siphon calcified atherosclerosis. After discussing with Dr. Amada Jupiter, decision was made to defer any repeat CTA attempt and proceed with a CT brain perfusion study (reported above). 3.  Chronic left MCA ischemia.   Original Report Authenticated By: Erskine Speed, M.D.   Dg Chest 2 View  07/03/2013   *RADIOLOGY REPORT*  Clinical Data: Stroke patient, history hypertension, diabetes, smoking  CHEST - 2 VIEW  Comparison: None.  Findings: No active infiltrate or effusion is seen.  Probable linear scarring or atelectasis is noted in the right mid lung. Moderate cardiomegaly is present.  There are diffuse degenerative changes throughout the thoracic spine.  IMPRESSION: Moderate cardiomegaly.  No active lung disease.   Original Report Authenticated By: Dwyane Dee, M.D.   Ct Head Wo Contrast  07/03/2013   *RADIOLOGY REPORT*  Clinical Data: Code stroke.  Left facial droop  CT HEAD WITHOUT CONTRAST  Technique:  Contiguous axial images were obtained from the base of the skull through the vertex without contrast.  Comparison: None.  Findings: Poorly defined hypodensity in the right insula, consistent with acute infarct in the right MCA territory.  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 Chronic infarct in the posterior internal capsule on the left.  Age appropriate atrophy.  Negative for hydrocephalus.  Negative for hemorrhage or mass.  IMPRESSION: Ill-defined hypodensity and edema in the right insular cortex compatible with acute infarct.  Negative for hemorrhage.  Critical Value/emergent results were called by telephone at the time of interpretation on 07/03/2013 at 9:36 hours to Dr. Petra Kuba, who verbally acknowledged  these results.   Original Report Authenticated By: Janeece Riggers, M.D.   Ct Angio Neck W/cm &/or Wo/cm  07/03/2013   *RADIOLOGY REPORT*  Clinical Data: 62 year old male Code stroke with left facial droop, right MCA territory symptoms.  Known decreased cardiac ejection fraction.  Contrast:  100 ml Omnipaque 350 (50 ml for attempted CTA, 50 ml for subsequent CT perfusion).  Comparison: Head CT without contrast 07/03/2013.  CT CEREBRAL PERFUSION WITH CONTRAST  Technique: Multidetector CT brain perfusion study performed during bolus IV contrast administration. Data analysis and post processing performed.   Findings: Suboptimal but felt to be diagnostic arterial input function.  Good venous input function. Perfusion maps demonstrate decreased cerebral blood flow and cerebral blood volume in the right MCA territory centered at the insula and operculum.  ROI measurements in the right MCA / opercular region reveal: - Cerebral blood volume 1.8 mL/100g (versus 3.20 left MCA) - Cerebral blood flow 7.1 mL/100g/min (versus 26.3 left MCA) - Mean transit time 20.2 seconds (versus 12.4 seconds) - Time to peak 23.9 seconds. (versus a 36.3 seconds).  IMPRESSION: 1.  CT perfusion suggests right MCA infarct  core centered at the insula/operculum. Findings reviewed in person with Dr. Onalee Hua at 1045 hours on 07/03/2013. 2.  Questionable benign oligemia in the contralateral left MCA territory, versus artifact due to cross suboptimal arterial input function. 3. Attempted CTA head and neck report is below.  CT ANGIOGRAPHY HEAD AND NECK  Technique:  Multidetector CT imaging of the head and neck was performed using the standard protocol during bolus administration of intravenous contrast.  Multiplanar CT image reconstructions including MIPs were obtained to evaluate the vascular anatomy. Carotid stenosis measurements (when applicable) are obtained utilizing NASCET criteria, using the distal internal carotid diameter as the  denominator.  CTA NECK  Findings:  Early contrast bolus timing, such that there is no aortic arch or carotid/vertebral artery contrast at the time of image acquisition. After discussing with Dr. Amada Jupiter, decision was made to defer any repeat CTA attempt and proceed with a CT brain perfusion study (report above).  Mild ground-glass opacity in the visualized upper lobes. Mediastinal lipomatosis.  Small superior mediastinal lymph nodes are within normal limits.  Atelectatic changes to the trachea. Negative thyroid.  Larynx and pharynx within normal limits. Parapharyngeal, retropharyngeal (incidental bilateral carotid artery retropharyngeal course), and sublingual spaces are within normal limits.  Negative submandibular and parotid glands. Postoperative changes to the orbits.  Chronic right lamina papyracea fracture. Visualized paranasal sinuses and mastoids are clear.  No cervical lymphadenopathy identified.  Widespread degenerative changes in the cervical spine with disc space loss, occasional vacuum disc phenomena, and endplate spurring. No acute osseous abnormality identified.  Mild calcified atherosclerosis of the aortic arch.  Mild calcified atherosclerosis of the proximal great vessels.  In adequate intraarterial contrast timing in the neck.  Calcified atherosclerosis of both carotid bifurcations.   Review of the MIP images confirms the above findings.  IMPRESSION: 1.  Inadequate intraarterial contrast bolus timing.  Calcified atherosclerosis in the neck.   After discussing with Dr. Amada Jupiter, decision was made to defer any repeat CTA attempt and proceed with a CT brain perfusion study (reported above). Incidental retropharyngeal course of both cervical carotid arteries. 2.  See intracranial findings below. 3.  Widespread cervical spine degeneration.  CTA HEAD  Findings:  Postcontrast images of the head redemonstrated left MCA territory chronic encephalomalacia.  No abnormal hyperenhancement. No  intracranial mass effect.  No ventriculomegaly.  Hypodensity at the right insula re-identified.  Decreased visualization of the right MCA M1 segment compared to the left.  Major intracranial venous structures appear to be normally enhancing.  Calvarium intact. Visualized scalp soft tissues are within normal limits.  Extensive left greater than right ICA siphon calcified atherosclerosis.  In adequate intraarterial contrast timing.   Review of the MIP images confirms the above findings.  IMPRESSION: 1.  Evidence of acute cortically based infarct at the right insula. No mass effect or hemorrhage.  Postcontrast images do demonstrate decreased visualization of the right MCA M1 segment.  2.  Inadequate intraarterial contrast timing.  Left worse than right widespread ICA siphon calcified atherosclerosis. After discussing with Dr. Amada Jupiter, decision was made to defer any repeat CTA attempt and proceed with a CT brain perfusion study (reported above). 3.  Chronic left MCA ischemia.   Original Report Authenticated By: Erskine Speed, M.D.   Mr Brain Wo Contrast  07/03/2013   *RADIOLOGY REPORT*  Clinical Data:  Left-sided weakness.  Facial droop.  Hypertensive diabetic patient with history of atrial fibrillation.  MRI BRAIN WITHOUT CONTRAST MRA HEAD WITHOUT CONTRAST  Technique: Multiplanar, multiecho pulse  sequences of the brain and surrounding structures were obtained according to standard protocol without intravenous contrast.  Angiographic images of the head were obtained using MRA technique without contrast.  Comparison: CT perfusion examination 07/03/2013.  Cervical spondylotic changes with spinal stenosis and cord flattening C3-4 level.  MRI HEAD  Findings:  Motion degraded exam limited to diffusion imaging and sagittal axial T1 sequence.  Moderate to large-sized right hemispheric acute infarct extends from the right opercular/sub insular region into the right frontal lobe bordering the anterior aspect of the right  parietal lobe.  No obvious intracranial hemorrhage although gradient sequence not obtained.  Remote left parietal lobe infarct encephalomalacia.  Remote posterior left lenticular nucleus/posterior limb left internal capsule infarct.  Small vessel disease type changes.  Atrophy without hydrocephalus.  No obvious mass.  IMPRESSION: Motion degraded exam limited to diffusion imaging and sagittal axial T1 sequence.  Moderate to large-sized right hemispheric acute infarct extends from the right opercular/sub insular region into the right frontal lobe bordering the anterior aspect of the right parietal lobe.  No obvious intracranial hemorrhage although gradient sequence not obtained.  Remote left parietal lobe infarct encephalomalacia.  Remote posterior left lenticular nucleus/posterior limb left internal capsule infarct.  Small vessel disease type changes.  Atrophy without hydrocephalus.  MRA HEAD  Findings: Exam significantly limited by motion.  Grading stenosis or evaluating for aneurysm is limited.  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  Flow is noted within the internal carotid arteries with suggestion of moderate narrowing of the pre cavernous and cavernous segment of the left internal carotid artery with mild narrowing and ectasia of the supraclinoid aspect of the internal carotid artery bilaterally.  Limited evaluation of branch vessels with suggestion of significant intracranial atherosclerotic type changes.  Aneurysm cannot be excluded or confirmed on the present exam.  IMPRESSION: Markedly motion degraded exam.  Please see above.  This has been made a PRA call report utilizing dashboard call feature.   Original Report Authenticated By: Lacy Duverney, M.D.   Ct Cerebral Perfusion W/cm  07/03/2013   *RADIOLOGY REPORT*  Clinical Data: 62 year old male Code stroke with left facial droop, right MCA territory  symptoms.  Known decreased cardiac ejection fraction.  Contrast:  100 ml Omnipaque 350 (50 ml for attempted CTA, 50 ml for subsequent CT perfusion).  Comparison: Head CT without contrast 07/03/2013.  CT CEREBRAL PERFUSION WITH CONTRAST  Technique: Multidetector CT brain perfusion study performed during bolus IV contrast administration. Data analysis and post processing performed.   Findings: Suboptimal but felt to be diagnostic arterial input function.  Good venous input function. Perfusion maps demonstrate decreased cerebral blood flow and cerebral blood volume in the right MCA territory centered at the insula and operculum.  ROI measurements in the right MCA / opercular region reveal: - Cerebral blood volume 1.8 mL/100g (versus 3.20 left MCA) - Cerebral blood flow 7.1 mL/100g/min (versus 26.3 left MCA) - Mean transit time 20.2 seconds (versus 12.4 seconds) - Time to peak 23.9 seconds. (versus a 36.3 seconds).  IMPRESSION: 1.  CT perfusion suggests right MCA infarct core centered at the insula/operculum. Findings reviewed in person with Dr. Onalee Hua at 1045 hours on 07/03/2013. 2.  Questionable benign oligemia in the contralateral left MCA territory, versus artifact due to cross suboptimal arterial input function. 3. Attempted CTA head and neck report is below.  CT ANGIOGRAPHY HEAD AND NECK  Technique:  Multidetector CT imaging of the head  and neck was performed using the standard protocol during bolus administration of intravenous contrast.  Multiplanar CT image reconstructions including MIPs were obtained to evaluate the vascular anatomy. Carotid stenosis measurements (when applicable) are obtained utilizing NASCET criteria, using the distal internal carotid diameter as the denominator.  CTA NECK  Findings:  Early contrast bolus timing, such that there is no aortic arch or carotid/vertebral artery contrast at the time of image acquisition. After discussing with Dr. Amada Jupiter, decision was made to  defer any repeat CTA attempt and proceed with a CT brain perfusion study (report above).  Mild ground-glass opacity in the visualized upper lobes. Mediastinal lipomatosis.  Small superior mediastinal lymph nodes are within normal limits.  Atelectatic changes to the trachea. Negative thyroid.  Larynx and pharynx within normal limits. Parapharyngeal, retropharyngeal (incidental bilateral carotid artery retropharyngeal course), and sublingual spaces are within normal limits.  Negative submandibular and parotid glands. Postoperative changes to the orbits.  Chronic right lamina papyracea fracture. Visualized paranasal sinuses and mastoids are clear.  No cervical lymphadenopathy identified.  Widespread degenerative changes in the cervical spine with disc space loss, occasional vacuum disc phenomena, and endplate spurring. No acute osseous abnormality identified.  Mild calcified atherosclerosis of the aortic arch.  Mild calcified atherosclerosis of the proximal great vessels.  In adequate intraarterial contrast timing in the neck.  Calcified atherosclerosis of both carotid bifurcations.   Review of the MIP images confirms the above findings.  IMPRESSION: 1.  Inadequate intraarterial contrast bolus timing.  Calcified atherosclerosis in the neck.   After discussing with Dr. Amada Jupiter, decision was made to defer any repeat CTA attempt and proceed with a CT brain perfusion study (reported above). Incidental retropharyngeal course of both cervical carotid arteries. 2.  See intracranial findings below. 3.  Widespread cervical spine degeneration.  CTA HEAD  Findings:  Postcontrast images of the head redemonstrated left MCA territory chronic encephalomalacia.  No abnormal hyperenhancement. No intracranial mass effect.  No ventriculomegaly.  Hypodensity at the right insula re-identified.  Decreased visualization of the right MCA M1 segment compared to the left.  Major intracranial venous structures appear to be normally  enhancing.  Calvarium intact. Visualized scalp soft tissues are within normal limits.  Extensive left greater than right ICA siphon calcified atherosclerosis.  In adequate intraarterial contrast timing.   Review of the MIP images confirms the above findings.  IMPRESSION: 1.  Evidence of acute cortically based infarct at the right insula. No mass effect or hemorrhage.  Postcontrast images do demonstrate decreased visualization of the right MCA M1 segment.  2.  Inadequate intraarterial contrast timing.  Left worse than right widespread ICA siphon calcified atherosclerosis. After discussing with Dr. Amada Jupiter, decision was made to defer any repeat CTA attempt and proceed with a CT brain perfusion study (reported above). 3.  Chronic left MCA ischemia.   Original Report Authenticated By: Erskine Speed, M.D.   Mr Mra Head/brain Wo Cm  07/03/2013   *RADIOLOGY REPORT*  Clinical Data:  Left-sided weakness.  Facial droop.  Hypertensive diabetic patient with history of atrial fibrillation.  MRI BRAIN WITHOUT CONTRAST MRA HEAD WITHOUT CONTRAST  Technique: Multiplanar, multiecho pulse sequences of the brain and surrounding structures were obtained according to standard protocol without intravenous contrast.  Angiographic images of the head were obtained using MRA technique without contrast.  Comparison: CT perfusion examination 07/03/2013.  Cervical spondylotic changes with spinal stenosis and cord flattening C3-4 level.  MRI HEAD  Findings:  Motion degraded exam limited to diffusion imaging and  sagittal axial T1 sequence.  Moderate to large-sized right hemispheric acute infarct extends from the right opercular/sub insular region into the right frontal lobe bordering the anterior aspect of the right parietal lobe.  No obvious intracranial hemorrhage although gradient sequence not obtained.  Remote left parietal lobe infarct encephalomalacia.  Remote posterior left lenticular nucleus/posterior limb left internal capsule  infarct.  Small vessel disease type changes.  Atrophy without hydrocephalus.  No obvious mass.  IMPRESSION: Motion degraded exam limited to diffusion imaging and sagittal axial T1 sequence.  Moderate to large-sized right hemispheric acute infarct extends from the right opercular/sub insular region into the right frontal lobe bordering the anterior aspect of the right parietal lobe.  No obvious intracranial hemorrhage although gradient sequence not obtained.  Remote left parietal lobe infarct encephalomalacia.  Remote posterior left lenticular nucleus/posterior limb left internal capsule infarct.  Small vessel disease type changes.  Atrophy without hydrocephalus.  MRA HEAD  Findings: Exam significantly limited by motion.  Grading stenosis or evaluating for aneurysm is limited.  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  Flow is noted within the internal carotid arteries with suggestion of moderate narrowing of the pre cavernous and cavernous segment of the left internal carotid artery with mild narrowing and ectasia of the supraclinoid aspect of the internal carotid artery bilaterally.  Limited evaluation of branch vessels with suggestion of significant intracranial atherosclerotic type changes.  Aneurysm cannot be excluded or confirmed on the present exam.  IMPRESSION: Markedly motion degraded exam.  Please see above.  This has been made a PRA call report utilizing dashboard call feature.   Original Report Authenticated By: Lacy Duverney, M.D.    Scheduled Meds: . allopurinol  100 mg Oral Daily  . aspirin  325 mg Oral Daily  . carvedilol  25 mg Oral BID WC  . enoxaparin (LOVENOX) injection  30 mg Subcutaneous Q24H  . furosemide  40 mg Oral Daily  . insulin aspart  0-5 Units Subcutaneous QHS  . insulin aspart  0-9 Units Subcutaneous TID WC  . isosorbide dinitrate  20 mg Oral Daily  . metolazone  5 mg Oral  Daily  . potassium chloride  40 mEq Oral Once  . pregabalin  75 mg Oral BID  . senna-docusate  2 tablet Oral Daily   Continuous Infusions:     Time spent: 25 minutes    Jobin Montelongo  [facility] Pager [phone] If 7PM-7AM, please contact night-coverage at [email or web address], password [facility] 07/04/2013, 6:50 PM  LOS: 1 day

## 2013-07-04 NOTE — Evaluation (Signed)
Occupational Therapy Evaluation Patient Details Name: Frank Davidson MRN: [id number] DOB: Dec 15, 1950 Today's Date: 07/04/2013 Time: 6578-4696 OT Time Calculation (min): 42 min  OT Assessment / Plan / Recommendation History of present illness Frank Davidson is a 62 y.o. 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 Sided weakness and facial droop.  MRI showed Moderate to large-sized right hemispheric acute infarct extends rom the right opercular/sub insular region into the right frontal lobe bordering the anterior aspect of the right parietal lobe   Clinical Impression   Pt admitted with new Rt. Sided infarct.  Pt demonstrates Lt. Inattention, cognitive deficits including impaired attention, orientation, and problem solving; Impaired standing balance.  He will benefit from continued OT for the below listed deficits to allow him to return to a supervision to min A level with BADLs after SNF level rehab    OT Assessment  Patient needs continued OT Services    Follow Up Recommendations  SNF    Barriers to Discharge Decreased caregiver support    Equipment Recommendations  None recommended by OT    Recommendations for Other Services    Frequency  Min 2X/week    Precautions / Restrictions Precautions Precautions: Fall Precaution Comments: Lt inattention   Pertinent Vitals/Pain     ADL  Eating/Feeding: Minimal assistance Where Assessed - Eating/Feeding: Chair Grooming: Wash/dry hands;Wash/dry face;Denture care;Minimal assistance Where Assessed - Grooming: Unsupported sitting Upper Body Bathing: Moderate assistance Where Assessed - Upper Body Bathing: Unsupported sitting Lower Body Bathing: Maximal assistance Where Assessed - Lower Body Bathing: Supported sit to stand Upper Body Dressing: Moderate assistance Where Assessed - Upper Body Dressing: Unsupported sitting Lower Body Dressing: +1 Total assistance Where  Assessed - Lower Body Dressing: Supported sit to Pharmacist, hospital: Moderate assistance Toilet Transfer Method: Sit to stand;Stand pivot Toilet Transfer Equipment: Bedside commode Toileting - Clothing Manipulation and Hygiene: +1 Total assistance Where Assessed - Toileting Clothing Manipulation and Hygiene: Standing Transfers/Ambulation Related to ADLs: mod A - requires max tactile and verbal cues to pivot due to Lt inattention - assist to advance lt. LE and for hand placement ADL Comments: Pt unable to don socks.  Reports someone assisted him with this PTA.  Assist needed to due Inattention    OT Diagnosis: Generalized weakness;Cognitive deficits;Disturbance of vision (Lt inattention)  OT Problem List: Decreased strength;Decreased activity tolerance;Impaired balance (sitting and/or standing);Impaired vision/perception;Decreased coordination;Decreased cognition;Decreased safety awareness;Decreased knowledge of use of DME or AE;Impaired UE functional use OT Treatment Interventions: Self-care/ADL training;Neuromuscular education;DME and/or AE instruction;Therapeutic activities;Cognitive remediation/compensation;Visual/perceptual remediation/compensation;Patient/family education;Balance training   OT Goals(Current goals can be found in the care plan section) Acute Rehab OT Goals Patient Stated Goal: To get better OT Goal Formulation: With patient/family Time For Goal Achievement: 07/11/13 Potential to Achieve Goals: Good ADL Goals Pt Will Perform Grooming: with min assist;standing Pt Will Perform Upper Body Bathing: with supervision;sitting Pt Will Perform Lower Body Bathing: with min assist;sit to/from stand Pt Will Perform Upper Body Dressing: with supervision;sitting Pt Will Perform Lower Body Dressing: with mod assist;sit to/from stand Pt Will Transfer to Toilet: with min assist;ambulating;regular height toilet;grab bars;bedside commode Pt Will Perform Toileting - Clothing Manipulation  and hygiene: with min assist;sit to/from stand Additional ADL Goal #1: Pt will correctly identify Lt UE and LE spontaneously and consistently  Additional ADL Goal #2: Pt will locate all items needed for BADLs  Visit Information  Last OT Received On: 07/04/13 Assistance Needed: +2 (+1 for transfer; +2  for further mobility) History of Present Illness: Frank Davidson is a 62 y.o. 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 Sided weakness and facial droop.  MRI showed Moderate to large-sized right hemispheric acute infarct extends rom the right opercular/sub insular region into the right frontal lobe bordering the anterior aspect of the right parietal lobe       Prior Functioning     Home Living Family/patient expects to be discharged to:: Skilled nursing facility Additional Comments: Pt has been at [facility] for ~1 mos after hospitalization for ? COPD Prior Function Level of Independence: Needs assistance Gait / Transfers Assistance Needed: Pt reports he was ambulatory at [facility] with RW and/or cane (used both) ADL's / Homemaking Assistance Needed: Pt reports he was performing bathing and dressing at NH Comments: Prior to NHP ~4-6 wks ago.  Pt lived alone, and reports he was independent with all BADLs and IADLs including driving, financial management and grocery shopping Communication Communication: No difficulties Dominant Hand: Left         Vision/Perception Vision - History Baseline Vision: Wears glasses only for reading Patient Visual Report: No change from baseline Vision - Assessment Eye Alignment: Within Functional Limits Vision Assessment: Vision tested Ocular Range of Motion: Within Functional Limits Tracking/Visual Pursuits: Able to track stimulus in all quads without difficulty Visual Fields: No apparent deficits Additional Comments: Pt with difficulty with testing, but fields grossly appear  intact Perception Perception: Impaired Inattention/Neglect: Does not attend to left side of body Praxis Praxis: Intact   Cognition  Cognition Arousal/Alertness: Awake/alert Behavior During Therapy: WFL for tasks assessed/performed Overall Cognitive Status: Impaired/Different from baseline Area of Impairment: Orientation;Attention;Problem solving;Awareness Orientation Level: Disoriented to;Time Current Attention Level: Selective Awareness: Intellectual Problem Solving: Slow processing;Difficulty sequencing;Requires verbal cues;Requires tactile cues General Comments: Pt also with Lt inattention that impacts problem solving and sequencing    Extremity/Trunk Assessment Upper Extremity Assessment Upper Extremity Assessment: LUE deficits/detail LUE Deficits / Details: Pt with full AROM Lt. UE; tremor noted.  Strength grossly 4/5 LUE Sensation: decreased proprioception (assesed through function - Lt inattention) Lower Extremity Assessment Lower Extremity Assessment: Defer to PT evaluation Cervical / Trunk Assessment Cervical / Trunk Assessment: Normal     Mobility Bed Mobility Bed Mobility: Supine to Sit;Sitting - Scoot to Edge of Bed Supine to Sit: 3: Mod assist;HOB elevated;With rails Sitting - Scoot to Edge of Bed: 4: Min assist Details for Bed Mobility Assistance: Pt requires step by step cues and increased time for problem solving.  Assist to move Lt. LE due to inattention and assist to lift shoulders Transfers Transfers: Sit to Stand;Stand to Sit Sit to Stand: 3: Mod assist;With upper extremity assist;From bed Stand to Sit: 3: Mod assist;With upper extremity assist;To chair/3-in-1;To bed Details for Transfer Assistance: Pt initially demonstrated difficulty moving into erect standing, however, with repetition, pt able to progress for full standing.  Requires assist to advance Lt. foot when transferring due to Lt. inattention and step by step cues with increased time      Exercise     Balance Balance Balance Assessed: Yes Static Sitting Balance Static Sitting - Balance Support: Feet supported;No upper extremity supported Static Sitting - Level of Assistance: 5: Stand by assistance Static Sitting - Comment/# of Minutes: 15 mins Dynamic Sitting Balance Dynamic Sitting - Balance Support: Feet supported Dynamic Sitting - Level of Assistance: 5: Stand by assistance Dynamic Sitting - Balance Activities: Reaching for objects Static Standing Balance Static  Standing - Balance Support: Right upper extremity supported Static Standing - Level of Assistance: 3: Mod assist Static Standing - Comment/# of Minutes: Leans to Rt and grabs for object to hold to   End of Session OT - End of Session Activity Tolerance: Patient tolerated treatment well Patient left: in chair;with call bell/phone within reach;with family/visitor present Nurse Communication: Mobility status  GO     Kaeya Schiffer M 07/04/2013, 1:11 PM

## 2013-07-04 NOTE — Progress Notes (Signed)
Twitching noted to the left arm. Patient stated that he didn't know he was doing it.

## 2013-07-04 NOTE — Progress Notes (Signed)
Stroke Team Progress Note  HISTORY Frank Davidson is a 62 y.o. male with a history of severe CHF who went to bed normal at 2159 8/11. He reports waking up normal around 6:30 am 8/12, however this is not clear given that he denies his current deficits as well. He had a breathing treatment this morning, though it is unclear how much assessment was actually done at that time and given that his predominant symptoms are in his leg and he does not recognize his deficits, I do not feel that this is a reliable time of onset. Later, it was noted that he had a left facial droop and weakness.  Speaking with his daughter, he was having some problems with his left leg even prior to todays event and it is unclear if this was weakness or pain.   Patient was not a TPA candidate secondary to delay in arrival. Acutely performed CT perfusion revealed some penumbra along with significant infarct. Risk outweigh benefits for interventions, therefore, were not offered.  He was admitted for further evaluation and treatment.  SUBJECTIVE His daughter (lives in [place]) and male is at the bedside.  Overall he feels his condition is gradually worsening. He now has "shaking" in his arm. Son will be here tomorrow.  OBJECTIVE Most recent Vital Signs: Filed Vitals:   07/03/13 2240 07/04/13 0015 07/04/13 0210 07/04/13 0605  BP: 124/75 133/89 125/64 122/76  Pulse: 76 76 64 74  Temp: 98.3 F (36.8 C) 98.1 F (36.7 C) 97.6 F (36.4 C) 97.7 F (36.5 C)  TempSrc: Oral Axillary Oral Oral  Resp: 20 20 18 20   SpO2: 99% 100% 100% 100%   CBG (last 3)   Recent Labs  07/03/13 1826 07/03/13 2125 07/04/13 0642  GLUCAP 108* 103* 100*    IV Fluid Intake:     MEDICATIONS  . allopurinol  100 mg Oral Daily  . aspirin  325 mg Oral Daily  . carvedilol  25 mg Oral BID WC  . enoxaparin (LOVENOX) injection  30 mg Subcutaneous Q24H  . furosemide  40 mg Oral Daily  . gabapentin  300 mg Oral BID  . insulin aspart  0-5 Units  Subcutaneous QHS  . insulin aspart  0-9 Units Subcutaneous TID WC  . isosorbide dinitrate  20 mg Oral Daily  . metolazone  5 mg Oral Daily  . OxyCODONE  10 mg Oral Q12H  . potassium chloride  40 mEq Oral Once  . pregabalin  75 mg Oral BID  . senna-docusate  2 tablet Oral Daily   PRN:  acetaminophen, acetaminophen, levalbuterol  Diet:  NPO  Activity:  OOB with assistance DVT Prophylaxis:  Lovenox 30 mg sq daily   CLINICALLY SIGNIFICANT STUDIES Basic Metabolic Panel:  Recent Labs Lab 07/03/13 0924 07/03/13 0927  NA 134* 134*  K 4.3 3.4*  CL 99 94*  CO2  --  28  GLUCOSE 120* 122*  BUN 29* 25*  CREATININE 1.70* 1.59*  CALCIUM  --  9.9   Liver Function Tests:  Recent Labs Lab 07/03/13 0927  AST 33  ALT 26  ALKPHOS 120*  BILITOT 0.9  PROT 7.6  ALBUMIN 3.0*   CBC:  Recent Labs Lab 07/03/13 0924 07/03/13 0927  WBC  --  5.8  NEUTROABS  --  4.2  HGB 14.3 12.0*  HCT 42.0 35.6*  MCV  --  82.2  PLT  --  322   Coagulation:  Recent Labs Lab 07/03/13 0927  LABPROT 15.2  INR  1.23   Cardiac Enzymes:  Recent Labs Lab 07/03/13 0933  TROPONINI <0.30   Urinalysis:  Recent Labs Lab 07/03/13 1922  COLORURINE YELLOW  LABSPEC 1.027  PHURINE 7.0  GLUCOSEU NEGATIVE  HGBUR NEGATIVE  BILIRUBINUR NEGATIVE  KETONESUR NEGATIVE  PROTEINUR NEGATIVE  UROBILINOGEN 0.2  NITRITE NEGATIVE  LEUKOCYTESUR NEGATIVE   Lipid Panel    Component Value Date/Time   CHOL 95 07/04/2013 0645   TRIG 66 07/04/2013 0645   HDL 27* 07/04/2013 0645   CHOLHDL 3.5 07/04/2013 0645   VLDL 13 07/04/2013 0645   LDLCALC 55 07/04/2013 0645   HgbA1C  No results found for this basename: HGBA1C    Urine Drug Screen:     Component Value Date/Time   LABOPIA NONE DETECTED 07/03/2013 1922   COCAINSCRNUR NONE DETECTED 07/03/2013 1922   LABBENZ NONE DETECTED 07/03/2013 1922   AMPHETMU NONE DETECTED 07/03/2013 1922   THCU NONE DETECTED 07/03/2013 1922   LABBARB NONE DETECTED 07/03/2013 1922    Alcohol  Level:  Recent Labs Lab 07/03/13 0927  ETH <11    CT of the brain  07/03/2013   Ill-defined hypodensity and edema in the right insular cortex compatible with acute infarct.  Negative for hemorrhage.    CT Angio Head 07/03/2013 1.  Evidence of acute cortically based infarct at the right insula. No mass effect or hemorrhage.  Postcontrast images do demonstrate decreased visualization of the right MCA M1 segment.  2.  Inadequate intraarterial contrast timing.  Left worse than right widespread ICA siphon calcified atherosclerosis. After discussing with Dr. Amada Jupiter, decision was made to defer any repeat CTA attempt and proceed with a CT brain perfusion study (reported above). 3.  Chronic left MCA ischemia.      Ct Angio Neck 07/03/2013   1.  Inadequate intraarterial contrast bolus timing.  Calcified atherosclerosis in the neck.   After discussing with Dr. Amada Jupiter, decision was made to defer any repeat CTA attempt and proceed with a CT brain perfusion study (reported above). Incidental retropharyngeal course of both cervical carotid arteries. 2.  See intracranial findings below. 3.  Widespread cervical spine degeneration.     CT Cerebral Perfusion  07/03/2013    1.  CT perfusion suggests right MCA infarct core centered at the insula/operculum. Findings reviewed in person with Dr. Onalee Hua at 1045 hours on 07/03/2013. 2.  Questionable benign oligemia in the contralateral left MCA territory, versus artifact due to cross suboptimal arterial input function.   MRI of the brain  07/03/2013  Motion degraded exam limited to diffusion imaging and sagittal axial T1 sequence.  Moderate to large-sized right hemispheric acute infarct extends from the right opercular/sub insular region into the right frontal lobe bordering the anterior aspect of the right parietal lobe.  No obvious intracranial hemorrhage although gradient sequence not obtained.  Remote left parietal lobe infarct encephalomalacia.  Remote  posterior left lenticular nucleus/posterior limb left internal capsule infarct.  Small vessel disease type changes.  Atrophy without hydrocephalus.  MRA of the brain  07/03/2013    Markedly motion degraded exam.   2D Echocardiogram    Carotid Doppler  Study was very technically difficult due to patient position, poor patient cooperation, body habitus, and depth of vessels. Due to technical limitations, the study was inconclusive.  CXR  07/03/2013    Moderate cardiomegaly.  No active lung disease.   EKG  .   Therapy Recommendations   Physical Exam   Middle aged african [place] male not in distress.Awake alert.  Afebrile. Head is nontraumatic. Neck is supple without bruit. Hearing is normal. Cardiac exam no murmur or gallop. Lungs are clear to auscultation. Distal pulses are well felt. Neurological Exam : Awake alert oriented x 3 normal speech and language. Mild left lower face asymmetry. Tongue midline. No drift. Mild diminished fine finger movements on left. Orbits right over left upper extremity. Mild left grip weak.. intermittent myoclonic tremor left upper extremity greater than right which is coarse and irregular and shows almost asterixis and the left wrist. Normal sensation . Normal coordination. ASSESSMENT [name] is a 62 y.o. male presenting with left sided weakness from a SNF.  Imaging confirms a moderate to large-sized right hemispheric acute right opercular/sub insular, frontal lobe infarct in setting of old left parietal lobe and posterior left lenticular nucleus/posterior limb left internal capsule infarcts. Infarcts felt to be embolic secondary to known afib, low EF.  On aspirin 325 mg orally every day prior to admission. Now on aspirin 325 mg orally every day (though NPO) for secondary stroke prevention. Patient with resultant left hemiparesis, dysarthria, dysphagia. Work up underway.  Myoclonic tremors, left greater than right. No hx of similar event. Has hx chronic RF  stage 3, also on neurontin and oxycontin. Both of these drugs can lead to tremor. Will discontinue.   atrial fibrillation, not on anticoagulation prior to admission, reason unknown  Hypertension Hyperlipidemia, LDL 55, on no statin PTA, now on no statin, at goal LDL < 70 for diabetics Diabetes, HgbA1c pending, goal < 7.0 CAD NICM, reported EF 15% in the past, 2D pendning OSA with use of CPAP Hx poly substance abuse Fibromyalgia COPD Widespread cervical spine degeneration  Hospital day # 1  TREATMENT/PLAN  Just passed swallow eval by ST. Ok to start diet - dysphagia 2, thin liquids.    As now able to swallow, will continue aspirin 325 mg orally every day for secondary stroke prevention. Need to consider anticoagulation given atrial fibrillation, await 2D prior to recommending agent  Discontinue neurontin and oxycontin to see in myoclonic tremors decrease/stop  Watch for acute on chronic renal failure given large dye load administered during CT perfusion yesterday.  OOB. Therapy evals  F/u 2D, HgbA1c  Annie Main, MSN, RN, ANVP-BC, ANP-BC, GNP-BC [facility] Pager: [phone] 07/04/2013 9:51 AM  I have personally obtained a history, examined the patient, evaluated imaging results, and formulated the assessment and plan of care. I agree with the above. Delia Heady, MD

## 2013-07-04 NOTE — Progress Notes (Signed)
UR COMPLETED  

## 2013-07-04 NOTE — Evaluation (Addendum)
Physical Therapy Evaluation Patient Details Name: Frank Davidson MRN: [id number] DOB: 07-13-1951 Today's Date: 07/04/2013 Time: 4098-1191 PT Time Calculation (min): 26 min  PT Assessment / Plan / Recommendation History of Present Illness  Frank Davidson is a 62 y.o. 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 Sided weakness and facial droop.  MRI showed Moderate to large-sized right hemispheric acute infarct extends rom the right opercular/sub insular region into the right frontal lobe bordering the anterior aspect of the right parietal lobe.  Of note, he also has bad left knee arthritis which makes it painful to move and WB through his left leg.    Clinical Impression  The pt was able to get OOB to chair today, but has significant deficits limiting his ability to walk safely without a second person to assist.  He has decreased awareness and insight into his deficits and this is complicated by a painful and arthritic left knee.  He will need significant rehab at discharge and is agreeable to continue doing his therapy at [facility] (as he was PTA) for his new issues related to the stroke.    PT Assessment  Patient needs continued PT services    Follow Up Recommendations  SNF    Does the patient have the potential to tolerate intense rehabilitation     NA  Barriers to Discharge   None None    Equipment Recommendations  None recommended by PT    Recommendations for Other Services   None  Frequency Min 3X/week    Precautions / Restrictions Precautions Precautions: Fall Precaution Comments: Lt inattention, decreased awareness of deficits   Pertinent Vitals/Pain See vitals flow sheet.       Mobility  Bed Mobility Bed Mobility: Not assessed (pt OOB in recliner chair already) Supine to Sit: 3: Mod assist;HOB elevated;With rails Sitting - Scoot to Edge of Bed: 4: Min assist Details for Bed Mobility Assistance: Pt  requires step by step cues and increased time for problem solving.  Assist to move Lt. LE due to inattention and assist to lift shoulders Transfers Sit to Stand: 3: Mod assist;With upper extremity assist;With armrests;From chair/3-in-1 Stand to Sit: 3: Mod assist;With upper extremity assist;With armrests;To chair/3-in-1 Details for Transfer Assistance: stood x2 once without assistive device and once with RW.  Both times pt needed mod assist to support trunk to prevent anterior LOB.  Pt very flexed at the trunk and leaning anteriorly.  Pt reports this is the same as before, family reports this is very different from before.  Pt was able to tell me that he was leaning forward, and that his left leg was weak, but was unable to functionally show awreness for those deficits when he moved.   Ambulation/Gait Ambulation/Gait Assistance: Not tested (comment) (Two people needed for safety when attempting gait ) Modified Rankin (Stroke Patients Only) Pre-Morbid Rankin Score: Moderately severe disability Modified Rankin: Severe disability        PT Diagnosis: Difficulty walking;Abnormality of gait;Acute pain;Generalized weakness;Hemiplegia dominant side;Altered mental status  PT Problem List: Decreased strength;Decreased activity tolerance;Decreased balance;Decreased mobility;Decreased coordination;Decreased cognition;Decreased knowledge of use of DME;Decreased safety awareness;Decreased knowledge of precautions;Impaired sensation;Obesity;Pain PT Treatment Interventions: DME instruction;Gait training;Functional mobility training;Therapeutic activities;Therapeutic exercise;Balance training;Neuromuscular re-education;Cognitive remediation;Patient/family education;Wheelchair mobility training     PT Goals(Current goals can be found in the care plan section) Acute Rehab PT Goals Patient Stated Goal: to get back to independence PT Goal Formulation: With patient/family Time For Goal Achievement:  07/18/13 Potential to Achieve Goals: Good  Visit Information  Last PT Received On: 07/04/13 Assistance Needed: +2 History of Present Illness: Frank Davidson is a 62 y.o. 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 Sided weakness and facial droop.  MRI showed Moderate to large-sized right hemispheric acute infarct extends rom the right opercular/sub insular region into the right frontal lobe bordering the anterior aspect of the right parietal lobe.  Of note, he also has bad left knee arthritis which makes it painful to move and WB through his left leg.         Prior Functioning  Home Living Family/patient expects to be discharged to:: Skilled nursing facility Additional Comments: Pt has been at [facility] for ~1 mos after hospitalization for ? COPD Prior Function Level of Independence: Needs assistance Gait / Transfers Assistance Needed: Pt reports he was ambulatory at [facility] with RW and/or cane (used both) ADL's / Homemaking Assistance Needed: Pt reports he was performing bathing and dressing at [facility] Communication / Swallowing Assistance Needed: pt has some expressive difficulties it seems he tries to say something and at times the wrong word comes out.   Comments: Prior to NHP ~4-6 wks ago.  Pt lived alone, and reports he was independent with all BADLs and IADLs including driving, financial management and grocery shopping Communication Communication: Expressive difficulties;Other (comment) (at times pt is saying the wrong word for things,) Dominant Hand: Left    Cognition  Cognition Arousal/Alertness: Awake/alert Behavior During Therapy: WFL for tasks assessed/performed Overall Cognitive Status: Impaired/Different from baseline Area of Impairment: Attention;Safety/judgement;Awareness Orientation Level: Disoriented to;Time Current Attention Level: Selective Safety/Judgement: Decreased awareness of safety;Decreased awareness of  deficits Awareness: Intellectual Problem Solving: Slow processing;Difficulty sequencing;Requires verbal cues;Requires tactile cues General Comments: pt was very sleepy when I came in which made his sustained attention difficult, but once awake and alert this got better.      Extremity/Trunk Assessment Upper Extremity Assessment Upper Extremity Assessment: Defer to OT evaluation LUE Deficits / Details: Pt with full AROM Lt. UE; tremor noted.  Strength grossly 4/5 LUE Sensation: decreased proprioception (assesed through function - Lt inattention) Lower Extremity Assessment Lower Extremity Assessment: RLE deficits/detail;LLE deficits/detail RLE Deficits / Details: grossly 3+-4/5 throughout.  Very stiff knee and hip joints LLE Deficits / Details: ankle DF 3/5, PF 3/5, knee flexion/ext 3-/5, hip flexion 3-/5 LLE Sensation: decreased light touch LLE Coordination: decreased fine motor;decreased gross motor Cervical / Trunk Assessment Cervical / Trunk Assessment: Normal   Balance Balance Balance Assessed: Yes Static Sitting Balance Static Sitting - Balance Support: No upper extremity supported;Feet supported Static Sitting - Level of Assistance: 5: Stand by assistance Static Sitting - Comment/# of Minutes: 15 mins Dynamic Sitting Balance Dynamic Sitting - Balance Support: Feet supported Dynamic Sitting - Level of Assistance: 5: Stand by assistance Dynamic Sitting - Balance Activities: Reaching for objects Static Standing Balance Static Standing - Balance Support: Bilateral upper extremity supported Static Standing - Level of Assistance: 3: Mod assist Static Standing - Comment/# of Minutes: with and without RW pt required mod assist of trunk to prevent anterior LOB.  Pt also relying heavily on LEs against chair for balance and support in standing.    End of Session PT - End of Session Activity Tolerance: Patient limited by fatigue;Patient limited by pain Patient left: in chair;with call  bell/phone within reach;with family/visitor present    Lurena Joiner B. Benicia Bergevin, PT, DPT [phone]   07/04/2013, 2:20 PM

## 2013-07-04 NOTE — Progress Notes (Signed)
*  PRELIMINARY RESULTS* Vascular Ultrasound Carotid Duplex (Doppler) has been completed.   Study was very technically difficult due to patient position, poor patient cooperation, body habitus, and depth of vessels. Due to technical limitations, the study was inconclusive.  07/04/2013 9:11 AM Gertie Fey, RVT, RDCS, RDMS

## 2013-07-04 NOTE — Progress Notes (Signed)
Nutrition Brief Note  Malnutrition Screening Tool result is inaccurate.  Please consult if nutrition needs are identified.  Hazel Wrinkle Kowalski RD, LDN Pager #[phone] After Hours pager #[phone]   

## 2013-07-04 NOTE — Consult Note (Signed)
WOC consult Note Reason for Consult: evaluation of right heel ulcer. Pt reports "contusion" and that he "hit it over something at home". Per his family at the bedside they report he has been in a rehab facility and that prior to his admission to the facility there was not a sore present. Unable to palpate pulses bilaterally, however able to obtain with doppler. Wound type: Stage II Pressure ulcer Pressure Ulcer POA: Yes Measurement: 3.5cm x 3.5cm x 0.2cm  Wound bed: dusky, moist, with evidence of ruptured bulla  Drainage (amount, consistency, odor) moderate amount, serous with mild odor (possibly from the length of time the dressing had been in place, the wound did not have odor) Periwound: somewhat macerated from the dressing with some superficial skin peeling at the wound edges Dressing procedure/placement/frequency:  Best practice with stable heel ulcers will be to leave them open to air, this wound has been covered for a while and is a little moist but not fluctuant. Will begin betadine swab to area daily to attempt to keep dry and stable and will add offloading boot to prevent further pressure.  Explained rationale of the offloading boot to family in the room. Pt to take Prevalon boot with him at discharge for continued use.  Bedside nurse to provide ordered wound care and apply offloading boot when it arrives to floor.   Discussed POC with patient and bedside nurse.  Re consult if needed, will not follow at this time. Thanks  [name] [employer], CWOCN [phone])

## 2013-07-05 DIAGNOSIS — I5022 Chronic systolic (congestive) heart failure: Secondary | ICD-10-CM | POA: Diagnosis present

## 2013-07-05 LAB — BASIC METABOLIC PANEL
CO2: 25 mEq/L (ref 19–32)
Calcium: 9.6 mg/dL (ref 8.4–10.5)
GFR calc non Af Amer: 67 mL/min — ABNORMAL LOW (ref >90)
Sodium: 134 mEq/L — ABNORMAL LOW (ref 135–145)

## 2013-07-05 LAB — GLUCOSE, CAPILLARY
Glucose-Capillary: 130 mg/dL — ABNORMAL HIGH (ref 70–99)
Glucose-Capillary: 143 mg/dL — ABNORMAL HIGH (ref 70–99)
Glucose-Capillary: 151 mg/dL — ABNORMAL HIGH (ref 70–99)
Glucose-Capillary: 150 mg/dL — ABNORMAL HIGH (ref 70–99)

## 2013-07-05 LAB — URINALYSIS, ROUTINE W REFLEX MICROSCOPIC
Ketones, ur: NEGATIVE mg/dL
Nitrite: NEGATIVE
Protein, ur: 30 mg/dL — AB
Specific Gravity, Urine: 1.015 (ref 1.005–1.030)
Urobilinogen, UA: 1 mg/dL (ref 0.0–1.0)

## 2013-07-05 LAB — URINE MICROSCOPIC-ADD ON

## 2013-07-05 MED ORDER — PATIENT'S GUIDE TO USING COUMADIN BOOK
Freq: Once | Status: AC
  Administered 2013-07-05: 09:00:00
  Filled 2013-07-05: qty 1

## 2013-07-05 MED ORDER — WARFARIN SODIUM 7.5 MG PO TABS
7.5000 mg | ORAL_TABLET | Freq: Once | ORAL | Status: AC
  Administered 2013-07-05: 7.5 mg via ORAL
  Filled 2013-07-05: qty 1

## 2013-07-05 MED ORDER — POTASSIUM CHLORIDE CRYS ER 20 MEQ PO TBCR
40.0000 meq | EXTENDED_RELEASE_TABLET | Freq: Once | ORAL | Status: AC
  Administered 2013-07-05: 40 meq via ORAL
  Filled 2013-07-05: qty 2

## 2013-07-05 MED ORDER — WARFARIN VIDEO
Freq: Once | Status: AC
  Administered 2013-07-05: 09:00:00

## 2013-07-05 MED ORDER — ENOXAPARIN SODIUM 40 MG/0.4ML ~~LOC~~ SOLN
40.0000 mg | SUBCUTANEOUS | Status: DC
  Administered 2013-07-05 – 2013-07-08 (×4): 40 mg via SUBCUTANEOUS
  Filled 2013-07-05 (×5): qty 0.4

## 2013-07-05 MED ORDER — WARFARIN - PHARMACIST DOSING INPATIENT
Freq: Every day | Status: DC
  Administered 2013-07-06: 18:00:00

## 2013-07-05 NOTE — Progress Notes (Signed)
Speech Language Pathology Dysphagia Treatment Patient Details Name: Frank Davidson MRN: [id number] DOB: 23-Jun-1951 Today's Date: 07/05/2013 Time: 7829-5621 SLP Time Calculation (min): 10 min  Assessment / Plan / Recommendation Clinical Impression  Pt consumed trials of Dys. 3 textures and thin liquids via cup with no overt s/s of aspiration/penetration observed. Pt was impulsive, consuming the majority of the cracker in one bite, and does not have top dentures. Therefore, recommend to continue current diet with likely advancement with dentures in place.  ( )    Diet Recommendation  Continue with Current Diet: Dysphagia 2 (fine chop);Thin liquid    SLP Plan Continue with current plan of care   Pertinent Vitals/Pain N/A   Swallowing Goals  SLP Swallowing Goals Patient will consume recommended diet without observed clinical signs of aspiration with: Minimal assistance Swallow Study Goal #1 - Progress: Progressing toward goal Patient will utilize recommended strategies during swallow to increase swallowing safety with: Minimal assistance Swallow Study Goal #2 - Progress: Progressing toward goal  General Temperature Spikes Noted: No Respiratory Status: Room air Behavior/Cognition: Alert;Cooperative;Pleasant mood Oral Cavity - Dentition: Dentures, not available;Missing dentition;Dentures, top (top dentures unavailable, teeth on lower) Patient Positioning: Upright in chair  Oral Cavity - Oral Hygiene Does patient have any of the following "at risk" factors?: None of the above Brush patient's teeth BID with toothbrush (using toothpaste with fluoride): Yes   Dysphagia Treatment Treatment focused on: Skilled observation of diet tolerance;Upgraded PO texture trials;Patient/family/caregiver education;Utilization of compensatory strategies Family/Caregiver Educated: two sons Treatment Methods/Modalities: Skilled observation;Differential diagnosis Patient observed directly with PO's:  Yes Type of PO's observed: Dysphagia 3 (soft);Thin liquids Feeding: Able to feed self Liquids provided via: Cup;No straw   GO     Maxcine Ham 07/05/2013, 4:25 PM   Maxcine Ham, M.A. CCC-SLP

## 2013-07-05 NOTE — Progress Notes (Signed)
Stroke Team Progress Note  HISTORY Frank Davidson is a 62 y.o. male with a history of severe CHF who went to bed normal at 2159 8/11. He reports waking up normal around 6:30 am 8/12, however this is not clear given that he denies his current deficits as well. He had a breathing treatment this morning, though it is unclear how much assessment was actually done at that time and given that his predominant symptoms are in his leg and he does not recognize his deficits, I do not feel that this is a reliable time of onset. Later, it was noted that he had a left facial droop and weakness. Speaking with his daughter, he was having some problems with his left leg even prior to todays event and it is unclear if this was weakness or pain.   Patient was not a TPA candidate secondary to delay in arrival. Acutely performed CT perfusion revealed some penumbra along with significant infarct. Risk outweigh benefits for interventions, therefore, were not offered.  He was admitted for further evaluation and treatment.  SUBJECTIVE Patient stting on edge of bed. 2 males at bedside; they have someone on the phone listening to MD/pt interaction. Patient admitted from [facility] - was there prior to admission - recently moved from a rehab in [place] in order to be closer to his family.  OBJECTIVE Most recent Vital Signs: Filed Vitals:   07/04/13 1809 07/04/13 2016 07/05/13 0122 07/05/13 0717  BP: 117/67  76/51 116/95  Pulse: 73  77 75  Temp: 98.2 F (36.8 C)  97.9 F (36.6 C) 97.7 F (36.5 C)  TempSrc: Oral  Oral Oral  Resp: 20  18 20   SpO2: 92% 94% 96% 99%   CBG (last 3)   Recent Labs  07/04/13 1143 07/04/13 2214 07/05/13 0715  GLUCAP 136* 135* 130*   IV Fluid Intake:     MEDICATIONS  . allopurinol  100 mg Oral Daily  . aspirin  325 mg Oral Daily  . carvedilol  25 mg Oral BID WC  . enoxaparin (LOVENOX) injection  40 mg Subcutaneous Q24H  . furosemide  40 mg Oral Daily  . insulin aspart  0-5 Units  Subcutaneous QHS  . insulin aspart  0-9 Units Subcutaneous TID WC  . isosorbide dinitrate  20 mg Oral Daily  . metolazone  5 mg Oral Daily  . patient's guide to using coumadin book   Does not apply Once  . potassium chloride  40 mEq Oral Once  . pregabalin  75 mg Oral BID  . senna-docusate  2 tablet Oral Daily  . warfarin  7.5 mg Oral ONCE-1800  . warfarin   Does not apply Once  . Warfarin - Pharmacist Dosing Inpatient   Does not apply q1800   PRN:  acetaminophen, acetaminophen, levalbuterol  Diet:  Dysphagia 2 thinl liquids Activity:  OOB with assistance DVT Prophylaxis:  Lovenox 40 mg sq daily   CLINICALLY SIGNIFICANT STUDIES Basic Metabolic Panel:   Recent Labs Lab 07/04/13 1235 07/05/13 0715  NA 136 134*  K 3.5 3.1*  CL 96 95*  CO2 30 25  GLUCOSE 135* 127*  BUN 18 16  CREATININE 1.24 1.13  CALCIUM 9.4 9.6   Liver Function Tests:   Recent Labs Lab 07/03/13 0927  AST 33  ALT 26  ALKPHOS 120*  BILITOT 0.9  PROT 7.6  ALBUMIN 3.0*   CBC:   Recent Labs Lab 07/03/13 0924 07/03/13 0927  WBC  --  5.8  NEUTROABS  --  4.2  HGB 14.3 12.0*  HCT 42.0 35.6*  MCV  --  82.2  PLT  --  322   Coagulation:   Recent Labs Lab 07/03/13 0927  LABPROT 15.2  INR 1.23   Cardiac Enzymes:   Recent Labs Lab 07/03/13 0933  TROPONINI <0.30   Urinalysis:   Recent Labs Lab 07/03/13 1922  COLORURINE YELLOW  LABSPEC 1.027  PHURINE 7.0  GLUCOSEU NEGATIVE  HGBUR NEGATIVE  BILIRUBINUR NEGATIVE  KETONESUR NEGATIVE  PROTEINUR NEGATIVE  UROBILINOGEN 0.2  NITRITE NEGATIVE  LEUKOCYTESUR NEGATIVE   Lipid Panel    Component Value Date/Time   CHOL 95 07/04/2013 0645   TRIG 66 07/04/2013 0645   HDL 27* 07/04/2013 0645   CHOLHDL 3.5 07/04/2013 0645   VLDL 13 07/04/2013 0645   LDLCALC 55 07/04/2013 0645   HgbA1C  Lab Results  Component Value Date   HGBA1C 6.0* 07/04/2013    Urine Drug Screen:     Component Value Date/Time   LABOPIA NONE DETECTED 07/03/2013 1922    COCAINSCRNUR NONE DETECTED 07/03/2013 1922   LABBENZ NONE DETECTED 07/03/2013 1922   AMPHETMU NONE DETECTED 07/03/2013 1922   THCU NONE DETECTED 07/03/2013 1922   LABBARB NONE DETECTED 07/03/2013 1922    Alcohol Level:   Recent Labs Lab 07/03/13 0927  ETH <11    CT of the brain  07/03/2013   Ill-defined hypodensity and edema in the right insular cortex compatible with acute infarct.  Negative for hemorrhage.    CT Angio Head 07/03/2013 1.  Evidence of acute cortically based infarct at the right insula. No mass effect or hemorrhage.  Postcontrast images do demonstrate decreased visualization of the right MCA M1 segment.  2.  Inadequate intraarterial contrast timing.  Left worse than right widespread ICA siphon calcified atherosclerosis. After discussing with Dr. Amada Jupiter, decision was made to defer any repeat CTA attempt and proceed with a CT brain perfusion study (reported above). 3.  Chronic left MCA ischemia.      Ct Angio Neck 07/03/2013   1.  Inadequate intraarterial contrast bolus timing.  Calcified atherosclerosis in the neck.   After discussing with Dr. Amada Jupiter, decision was made to defer any repeat CTA attempt and proceed with a CT brain perfusion study (reported above). Incidental retropharyngeal course of both cervical carotid arteries. 2.  See intracranial findings below. 3.  Widespread cervical spine degeneration.     CT Cerebral Perfusion  07/03/2013    1.  CT perfusion suggests right MCA infarct core centered at the insula/operculum. Findings reviewed in person with Dr. Onalee Hua at 1045 hours on 07/03/2013. 2.  Questionable benign oligemia in the contralateral left MCA territory, versus artifact due to cross suboptimal arterial input function.   MRI of the brain  07/03/2013  Motion degraded exam limited to diffusion imaging and sagittal axial T1 sequence.  Moderate to large-sized right hemispheric acute infarct extends from the right opercular/sub insular region into the  right frontal lobe bordering the anterior aspect of the right parietal lobe.  No obvious intracranial hemorrhage although gradient sequence not obtained.  Remote left parietal lobe infarct encephalomalacia.  Remote posterior left lenticular nucleus/posterior limb left internal capsule infarct.  Small vessel disease type changes.  Atrophy without hydrocephalus.  MRA of the brain  07/03/2013    Markedly motion degraded exam.   2D Echocardiogram  EF 20-25% with no source of embolus. Diffuse hypokinesis  Carotid Doppler  Study was very technically difficult due to patient position, poor patient cooperation, body  habitus, and depth of vessels. Due to technical limitations, the study was inconclusive.  CXR  07/03/2013    Moderate cardiomegaly.  No active lung disease.   EKG  atrial fibrillation, HR 80  Therapy Recommendations SNF  Physical Exam   Middle aged african [place] male not in distress.Awake alert. Afebrile. Head is nontraumatic. Neck is supple without bruit. Hearing is normal. Cardiac exam no murmur or gallop. Lungs are clear to auscultation. Distal pulses are well felt. Neurological Exam : Awake alert oriented x 3 normal speech and language. Mild left lower face asymmetry. Tongue midline. No drift. Mild diminished fine finger movements on left. Orbits right over left upper extremity. Mild left grip weak.. intermittent myoclonic tremor left upper extremity greater than right which is coarse and irregular and shows almost asterixis and the left wrist. Normal sensation . Normal coordination.  ASSESSMENT [name] is a 62 y.o. male presenting with left sided weakness from a SNF.  Imaging confirms a moderate to large-sized right hemispheric acute right opercular/sub insular, frontal lobe infarct in setting of old left parietal lobe and posterior left lenticular nucleus/posterior limb left internal capsule infarcts. Infarcts felt to be embolic secondary to known afib, low EF.  On aspirin  325 mg orally every day prior to admission. Now on aspirin 325 mg orally every day and warfarin for secondary stroke prevention. Patient with resultant left hemiparesis, dysarthria, dysphagia. Work up completed.  Myoclonic tremors, left greater than right. No hx of similar event. Has hx chronic RF stage 3, also on neurontin and oxycontin. Both of these drugs can lead to tremor - discontinued - only minimal improvement over night atrial fibrillation, not on anticoagulation prior to admission, reason unknown  Hypertension Hyperlipidemia, LDL 55, on no statin PTA, now on no statin, at goal LDL < 70 for diabetics Diabetes, HgbA1c 6.0, at goal < 7.0 CAD NICM, reported EF 15% in the past, now 20-25% OSA with use of CPAP Hx poly substance abuse Fibromyalgia COPD Widespread cervical spine degeneration Chronic kidney disease, Cr now 1.13  Hospital day # 2  TREATMENT/PLAN  Continue aspirin 325 mg orally every day for secondary stroke prevention until INR therapeutic. Coumadin started 8/13  Continue to watch myoclonic tremors  SW consult for return to [facility] - will need PT, OT, ST f/u  Recommend IP cardiology consult as he is new to the area in order to get established  Low salt and carb modified diet added to current D2 diet  Dr Pearlean Brownie ok'd shower with assistance if therapists feel it is safe  Will ask Dr. Gonzella Lex to address global issues with the family.  Dr. Pearlean Brownie discussed diagnosis, prognosis,  treatment options and plan of care with family at bedside and person on the phone. They are requesting a cardiology consult in the hospital - they want to know if he will get a pacemaker.Marland Kitchen    Annie Main, MSN, RN, ANVP-BC, ANP-BC, [facility] Pager: [phone] 07/05/2013 10:30 AM  I have personally obtained a history, examined the patient, evaluated imaging results, and formulated the assessment and plan of care. I agree with the above.  Delia Heady, MD

## 2013-07-05 NOTE — Evaluation (Signed)
Speech Language Pathology Evaluation Patient Details Name: Frank Davidson MRN: [id number] DOB: [birth year] Today's Date: 07/05/2013 Time: 1515-1600 SLP Time Calculation (min): 45 min  Problem List:  Patient Active Problem List   Diagnosis Date Noted  . Stroke 07/03/2013  . CVA (cerebral infarction) 07/03/2013  . A-fib 07/03/2013  . Hypokalemia 07/03/2013  . Hypertension   . COPD (chronic obstructive pulmonary disease)   . Diabetes mellitus without complication   . CKD (chronic kidney disease), stage III   . CHF (congestive heart failure)   . Nonischemic cardiomyopathy   . Polysubstance abuse    Past Medical History:  Past Medical History  Diagnosis Date  . Fibromyalgia   . Atrial fibrillation   . Hypertension   . Osteoarthritis   . COPD (chronic obstructive pulmonary disease)   . Sleep apnea with use of continuous positive airway pressure (CPAP)   . Diabetes mellitus without complication   . Coronary artery disease   . Neuropathy   . Ocular herpes   . Gout   . CKD (chronic kidney disease), stage III   . CHF (congestive heart failure)     EF 15%  . Polysubstance abuse     Hx of  . Chronic pain   . Hyperlipidemia   . Nonischemic cardiomyopathy    Past Surgical History:  Past Surgical History  Procedure Laterality Date  . 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 Cardiac catheterization  10/2006    normal coronary arteries   HPI:  Frank Davidson is a 62 y.o. 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 Sided weakness and facial droop. He is a [employer] and it was initially reported that when the RT at the facility gave him a breathing treatment at 6:30am today that he was at his 'baseline'...but that RT unable to say whether or not he was able to move his L. Side at that time and pt in unable to tell whether or not he had any weakness or numbness. It was later on this am when another staff saw him  that it was noted that he had a left droop and was also weak on the L. Side and so EMS was called. He denies headaches, parasthesias, slurred speech, dysphagia and no changes in his vision. Upon arrival in the ED code stroke was called>>he was seen by Neuro and CT head  ill-defined hypodensity and edema in the right insular cortex compatible with acute infarct. Negative for hemorrhage.CT perfusion  Was done and suggests right MCA infarct core centered at the insula/operculum   Assessment / Plan / Recommendation Clinical Impression  Pt presents with mild cognitive deficits in the areas of emergent awareness and recall of new information. Speech is mildly slurred although intelligible. Baseline information is unavailable as family is not able to comment, however pt resides at [facility] and will have the necessary support when returning home. No acute SLP intervention for cognitive-linguistic and speech skills, however recommend f/u at next level of care. ( )    SLP Assessment  All further Speech Lanaguage Pathology  needs can be addressed in the next venue of care    Follow Up Recommendations  Skilled Nursing facility;24 hour supervision/assistance    Frequency and Duration        Pertinent Vitals/Pain N/A   SLP Goals  SLP Goals Progress/Goals/Alternative treatment plan discussed with pt/caregiver and they: Agree  SLP Evaluation Prior Functioning  Cognitive/Linguistic Baseline: Information not available (sons unsure of baseline, came from SNF) Type of Home: Skilled Nursing Facility [facility]) Available Help at Discharge: Skilled Nursing Facility   Cognition  Overall Cognitive Status: Difficult to assess (family present unable to provide information) Arousal/Alertness: Awake/alert Orientation Level: Oriented to person;Disoriented to place;Disoriented to time;Disoriented to situation Attention: Sustained Sustained Attention: Impaired Sustained Attention Impairment: Verbal complex;Functional  complex Memory: Impaired Memory Impairment: Decreased recall of new information Awareness: Impaired Awareness Impairment: Emergent impairment Problem Solving: Impaired Problem Solving Impairment: Verbal complex;Functional complex Executive Function: Reasoning;Initiating Reasoning: Appears intact Initiating: Appears intact Behaviors: Impulsive Safety/Judgment: Appears intact    Comprehension  Auditory Comprehension Overall Auditory Comprehension: Appears within functional limits for tasks assessed Visual Recognition/Discrimination Discrimination: Within Function Limits Reading Comprehension Reading Status: Not tested    Expression Expression Primary Mode of Expression: Verbal Verbal Expression Overall Verbal Expression: Appears within functional limits for tasks assessed Written Expression Dominant Hand: Left Written Expression: Not tested   Oral / Motor Motor Speech Overall Motor Speech: Impaired Respiration: Impaired Level of Impairment: Conversation (baseline COPD) Phonation: Normal Resonance: Within functional limits Articulation: Impaired Level of Impairment: Conversation Intelligibility: Intelligible Motor Planning: Witnin functional limits Motor Speech Errors: Not applicable Interfering Components: Inadequate dentition   GO     Maxcine Ham 07/05/2013, 4:36 PM  Maxcine Ham, M.A. CCC-SLP

## 2013-07-05 NOTE — Consult Note (Signed)
Admit date: 07/03/2013 Referring Physician  [facility] Primary Physician  None Primary Cardiologist  None Reason for Consultation  History of CHF. Admitted with CVA  ASSESSMENT: 1. CVA, acute, likely embolic 2, Chronic systolic heart failure due to non ischemic CM,  previously cared for by cardiologist in [place]. No specific cardiac complaints 3. Chronic atrial fibrillation 4. Diabetes mellitus 5. COPD 6. Sleep apnea 7. Chronic kidney disease stage 3  PLAN: 1. Embolic risk is high (CHADS score greater than 4) and in setting of CVA, cardiac source likely and anticoagulation with coumadin required 2. Continue heart failure therapy 3. Not a good situation as the patient will need much support from multiple health care providers. Needs to have a primary physician willing to coordinate care 4. We'll speak with attending in a.m. to see if there are specific cardiac issues that need to be addressed  HPI:  No specific cardiac complaints. Has chronic dyspnea and lower extremity swelling. He denies chest pain and palpitations.  Admitted with stroke from rehab center.  Conversation with patient and son about cardiac condition and there is much confusion   PMH:   Past Medical History  Diagnosis Date  . Fibromyalgia   . Atrial fibrillation   . Hypertension   . Osteoarthritis   . COPD (chronic obstructive pulmonary disease)   . Sleep apnea with use of continuous positive airway pressure (CPAP)   . Diabetes mellitus without complication   . Coronary artery disease   . Neuropathy   . Ocular herpes   . Gout   . CKD (chronic kidney disease), stage III   . CHF (congestive heart failure)     EF 15%  . Polysubstance abuse     Hx of  . Chronic pain   . Hyperlipidemia   . Nonischemic cardiomyopathy      PSH:   Past Surgical History  Procedure Laterality Date  . 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 Cardiac catheterization  10/2006    normal coronary arteries    Allergies:   Actos Prior to Admit Meds:   Prescriptions prior to admission  Medication Sig Dispense Refill  . allopurinol (ZYLOPRIM) 100 MG tablet Take 100 mg by mouth daily.      Marland Kitchen aspirin 325 MG tablet Take 325 mg by mouth daily.      . carvedilol (COREG) 25 MG tablet Take 25 mg by mouth 2 (two) times daily with a meal.      . furosemide (LASIX) 40 MG tablet Take 40 mg by mouth daily.      Marland Kitchen gabapentin (NEURONTIN) 300 MG capsule Take 300 mg by mouth 2 (two) times daily.      Marland Kitchen glipiZIDE (GLUCOTROL) 10 MG tablet Take 10 mg by mouth 2 (two) times daily before a meal.      . isosorbide dinitrate (ISORDIL) 20 MG tablet Take 20 mg by mouth daily.      . metolazone (ZAROXOLYN) 5 MG tablet Take 5 mg by mouth daily.      Marland Kitchen oxyCODONE (OXYCONTIN) 10 MG 12 hr tablet Take 10 mg by mouth every 12 (twelve) hours.      . pregabalin (LYRICA) 75 MG capsule Take 75 mg by mouth 2 (two) times daily.      Marland Kitchen senna-docusate (SENOKOT-S) 8.6-50 MG per tablet Take 2 tablets by mouth daily.       Fam HX:   No family history on file. Social HX:    History  Social History  . Marital Status: Divorced    Spouse Name: N/A    Number of Children: N/A  . Years of Education: N/A   Occupational History  . Not on file.   Social History Main Topics  . Smoking status: Never Smoker   . Smokeless tobacco: Not on file  . Alcohol Use: No  . Drug Use: No  . Sexual Activity: Not on file   Other Topics Concern  . Not on file   Social History Narrative  . No narrative on file     Review of Systems: Constipation. Sore on right heel developed since he's been in the nursing home. Left-sided weakness.  Physical Exam: Blood pressure 109/71, pulse 80, temperature 97.7 F (36.5 C), temperature source Oral, resp. rate 20, SpO2 100.00%. Weight change:   Obese African American gentleman sitting in a chair in his room with mild to moderate respiratory effort noted.  Short, fat neck with no obvious JVD  Chest reveals decreased  breath sounds at both bases without wheezes  Cardiac exam reveals an irregular rhythm without obvious gallop or murmur  Abdomen is distended/obese and nontender.  Bilateral lower extremity edema, with right foot wrapped in a boot due to a heel ulcer  Neurological exam reveals a patient who is mildly lethargic, able to engage in conversation, and exhibits left-sided weakness. Labs:   Lab Results  Component Value Date   WBC 5.8 07/03/2013   HGB 12.0* 07/03/2013   HCT 35.6* 07/03/2013   MCV 82.2 07/03/2013   PLT 322 07/03/2013    Recent Labs Lab 07/03/13 0927  07/05/13 0715  NA 134*  < > 134*  K 3.4*  < > 3.1*  CL 94*  < > 95*  CO2 28  < > 25  BUN 25*  < > 16  CREATININE 1.59*  < > 1.13  CALCIUM 9.9  < > 9.6  PROT 7.6  --   --   BILITOT 0.9  --   --   ALKPHOS 120*  --   --   ALT 26  --   --   AST 33  --   --   GLUCOSE 122*  < > 127*  < > = values in this interval not displayed. No results found for this basename: PTT   Lab Results  Component Value Date   INR 1.23 07/03/2013   Lab Results  Component Value Date   TROPONINI <0.30 07/03/2013     Lab Results  Component Value Date   CHOL 95 07/04/2013   Lab Results  Component Value Date   HDL 27* 07/04/2013   Lab Results  Component Value Date   LDLCALC 55 07/04/2013   Lab Results  Component Value Date   TRIG 66 07/04/2013   Lab Results  Component Value Date   CHOLHDL 3.5 07/04/2013   No results found for this basename: LDLDIRECT      Radiology:  Dg Chest 2 View  07/03/2013   *RADIOLOGY REPORT*  Clinical Data: Stroke patient, history hypertension, diabetes, smoking  CHEST - 2 VIEW  Comparison: None.  Findings: No active infiltrate or effusion is seen.  Probable linear scarring or atelectasis is noted in the right mid lung. Moderate cardiomegaly is present.  There are diffuse degenerative changes throughout the thoracic spine.  IMPRESSION: Moderate cardiomegaly.  No active lung disease.   Original Report Authenticated  By: Dwyane Dee, M.D.   Mr Brain Wo Contrast  07/03/2013   *RADIOLOGY REPORT*  Clinical  Data:  Left-sided weakness.  Facial droop.  Hypertensive diabetic patient with history of atrial fibrillation.  MRI BRAIN WITHOUT CONTRAST MRA HEAD WITHOUT CONTRAST  Technique: Multiplanar, multiecho pulse sequences of the brain and surrounding structures were obtained according to standard protocol without intravenous contrast.  Angiographic images of the head were obtained using MRA technique without contrast.  Comparison: CT perfusion examination 07/03/2013.  Cervical spondylotic changes with spinal stenosis and cord flattening C3-4 level.  MRI HEAD  Findings:  Motion degraded exam limited to diffusion imaging and sagittal axial T1 sequence.  Moderate to large-sized right hemispheric acute infarct extends from the right opercular/sub insular region into the right frontal lobe bordering the anterior aspect of the right parietal lobe.  No obvious intracranial hemorrhage although gradient sequence not obtained.  Remote left parietal lobe infarct encephalomalacia.  Remote posterior left lenticular nucleus/posterior limb left internal capsule infarct.  Small vessel disease type changes.  Atrophy without hydrocephalus.  No obvious mass.  IMPRESSION: Motion degraded exam limited to diffusion imaging and sagittal axial T1 sequence.  Moderate to large-sized right hemispheric acute infarct extends from the right opercular/sub insular region into the right frontal lobe bordering the anterior aspect of the right parietal lobe.  No obvious intracranial hemorrhage although gradient sequence not obtained.  Remote left parietal lobe infarct encephalomalacia.  Remote posterior left lenticular nucleus/posterior limb left internal capsule infarct.  Small vessel disease type changes.  Atrophy without hydrocephalus.  MRA HEAD  Findings: Exam significantly limited by motion.  Grading stenosis or evaluating for aneurysm is limited.  On this  limited examination, significant high-grade tandem stenosis suspected involving portions of the vertebral arteries and basilar artery.  Flow is noted within the internal carotid arteries with suggestion of moderate narrowing of the pre cavernous and cavernous segment of the left internal carotid artery with mild narrowing and ectasia of the supraclinoid aspect of the internal carotid artery bilaterally.  Limited evaluation of branch vessels with suggestion of significant intracranial atherosclerotic type changes.  Aneurysm cannot be excluded or confirmed on the present exam.  IMPRESSION: Markedly motion degraded exam.  Please see above.  This has been made a PRA call report utilizing dashboard call feature.   Original Report Authenticated By: Lacy Duverney, M.D.   Mr Mra Head/brain Wo Cm  07/03/2013   *RADIOLOGY REPORT*  Clinical Data:  Left-sided weakness.  Facial droop.  Hypertensive diabetic patient with history of atrial fibrillation.  MRI BRAIN WITHOUT CONTRAST MRA HEAD WITHOUT CONTRAST  Technique: Multiplanar, multiecho pulse sequences of the brain and surrounding structures were obtained according to standard protocol without intravenous contrast.  Angiographic images of the head were obtained using MRA technique without contrast.  Comparison: CT perfusion examination 07/03/2013.  Cervical spondylotic changes with spinal stenosis and cord flattening C3-4 level.  MRI HEAD  Findings:  Motion degraded exam limited to diffusion imaging and sagittal axial T1 sequence.  Moderate to large-sized right hemispheric acute infarct extends from the right opercular/sub insular region into the right frontal lobe bordering the anterior aspect of the right parietal lobe.  No obvious intracranial hemorrhage although gradient sequence not obtained.  Remote left parietal lobe infarct encephalomalacia.  Remote posterior left lenticular nucleus/posterior limb left internal capsule infarct.  Small vessel disease type changes.   Atrophy without hydrocephalus.  No obvious mass.  IMPRESSION: Motion degraded exam limited to diffusion imaging and sagittal axial T1 sequence.  Moderate to large-sized right hemispheric acute infarct extends from the right opercular/sub insular region into the right frontal  lobe bordering the anterior aspect of the right parietal lobe.  No obvious intracranial hemorrhage although gradient sequence not obtained.  Remote left parietal lobe infarct encephalomalacia.  Remote posterior left lenticular nucleus/posterior limb left internal capsule infarct.  Small vessel disease type changes.  Atrophy without hydrocephalus.  MRA HEAD  Findings: Exam significantly limited by motion.  Grading stenosis or evaluating for aneurysm is limited.  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  Flow is noted within the internal carotid arteries with suggestion of moderate narrowing of the pre cavernous and cavernous segment of the left internal carotid artery with mild narrowing and ectasia of the supraclinoid aspect of the internal carotid artery bilaterally.  Limited evaluation of branch vessels with suggestion of significant intracranial atherosclerotic type changes.  Aneurysm cannot be excluded or confirmed on the present exam.  IMPRESSION: Markedly motion degraded exam.  Please see above.  This has been made a PRA call report utilizing dashboard call feature.   Original Report Authenticated By: Lacy Duverney, M.D.   EKG:  From 07/03/13, atrial fibrillation with controlled ventricular response  ECHOCARDIOGRAM: 07/04/13  Study Conclusions  - Left ventricle: The cavity size was moderately dilated. Wall thickness was normal. Systolic function was severely reduced. The estimated ejection fraction was in the range of 20% to 25%. Diffuse hypokinesis. - Mitral valve: Mild regurgitation. - Left atrium: The atrium was mildly  dilated. - Atrial septum: No defect or patent foramen ovale was identified.   Lesleigh [name] 07/05/2013 2:59 PM

## 2013-07-05 NOTE — Progress Notes (Addendum)
[facility] PROGRESS NOTE  Frank Davidson WUJ:[id number] DOB: Aug 22, 1951 DOA: 07/03/2013 PCP: Georgann Housekeeper, MD  Brief narrative  62 year old obese male with history of A. fib, hypertension, diabetes mellitus, nonischemic cardiomyopathy with EF of 25%, CKD. Stage 3 sent  from skilled nursing facility with acute onset of left-sided weakness and facial droop. CT head suggestive of right insular infarct. No TPA given in the ED as patient was out of TPA window.  Assessment/Plan:  Acute right sided infarct  Mild brain shows "Moderate to large-sized right hemispheric acute infarct extends from the right opercular/sub insular region into the right frontal lobe bordering the anterior aspect of the right parietal lobe."  Patient has residual left-sided weakness.  Continue aspirin 325 mg daily. starting on coumadin given hx of afib with high CHADS2 score of 5. Continue ASA until INR therapeutic. Not on statin as LDL at goal. Hemoglobin A1c of 6. continue PT /OT  Plan to d/c back to rehab once w/up completed  A. fib  Continue coreg and aspirin.  added Coumadin   Nonischemic cardiomyopathy  Appears compensated. Has some leg edema. Resume coreg and Lasix. EF of 20-25 % on 2D echo with diffuse hypokinesis. Patient [place] area recently and needs to establish care with cardiology. Have consulted [facility] for evaluation. Allowing permissive HTN for now. add lisinopril and aldactone in next few days .  Myoclonic jerks of legs  Unclear duration. D/ced neurontin and oxycodone .  CKD stage 3  Renal function stable   Diabetes mellitus  Continue sliding-scale insulin    Hypokalemia  Replenish kcl  COPD  Continue when necessary nebs   Obesity  counseled on weight loss and dietary modifications. Will benefit from sleep study as outpatient   Code Status: Full code  Family Communication: Son  at bedside . dicussed plan with niece camara over the phone Disposition Plan: Return to  skilled nursing facility following cardiology eval. Likely tomorrow  Consultants:  Neu[facility] consulted   Procedures:  None Antibiotics:  None   HPI/Subjective:  Patient seen and examined this morning. Family at bedside. Left sided weakness slowly improving   Objective: Filed Vitals:   07/05/13 0717  BP: 116/95  Pulse: 75  Temp: 97.7 F (36.5 C)  Resp: 20    Intake/Output Summary (Last 24 hours) at 07/05/13 1115 Last data filed at 07/04/13 1826  Gross per 24 hour  Intake    480 ml  Output    975 ml  Net   -495 ml   There were no vitals filed for this visit.  Exam: General: Middle aged obese male in no acute distress  HEENT: No pallor, moist oral mucosa  Chest: Clear to auscultation bilaterally, no added sounds  CVS: Normal S1-S2, no murmurs rub or gallop  Abdomen: Soft, nontender, nondistended, bowel sounds present  Extremities: Trace edema  CNS: AAO x3 power 4 x 5 over left extremity.   Data Reviewed: Basic Metabolic Panel:  Recent Labs Lab 07/03/13 0924 07/03/13 0927 07/04/13 1235 07/05/13 0715  NA 134* 134* 136 134*  K 4.3 3.4* 3.5 3.1*  CL 99 94* 96 95*  CO2  --  28 30 25   GLUCOSE 120* 122* 135* 127*  BUN 29* 25* 18 16  CREATININE 1.70* 1.59* 1.24 1.13  CALCIUM  --  9.9 9.4 9.6   Liver Function Tests:  Recent Labs Lab 07/03/13 0927  AST 33  ALT 26  ALKPHOS 120*  BILITOT 0.9  PROT 7.6  ALBUMIN 3.0*  No results found for this basename: LIPASE, AMYLASE,  in the last 168 hours No results found for this basename: AMMONIA,  in the last 168 hours CBC:  Recent Labs Lab 07/03/13 0924 07/03/13 0927  WBC  --  5.8  NEUTROABS  --  4.2  HGB 14.3 12.0*  HCT 42.0 35.6*  MCV  --  82.2  PLT  --  322   Cardiac Enzymes:  Recent Labs Lab 07/03/13 0933  TROPONINI <0.30   BNP (last 3 results) No results found for this basename: PROBNP,  in the last 8760 hours CBG:  Recent Labs Lab 07/03/13 2125 07/04/13 0642  07/04/13 1143 07/04/13 2214 07/05/13 0715  GLUCAP 103* 100* 136* 135* 130*    No results found for this or any previous visit (from the past 240 hour(s)).   Studies: Ct Angio Head W/cm &/or Wo Cm  07/03/2013   *RADIOLOGY REPORT*  Clinical Data: 62 year old male Code stroke with left facial droop, right MCA territory symptoms.  Known decreased cardiac ejection fraction.  Contrast:  100 ml Omnipaque 350 (50 ml for attempted CTA, 50 ml for subsequent CT perfusion).  Comparison: Head CT without contrast 07/03/2013.  CT CEREBRAL PERFUSION WITH CONTRAST  Technique: Multidetector CT brain perfusion study performed during bolus IV contrast administration. Data analysis and post processing performed.   Findings: Suboptimal but felt to be diagnostic arterial input function.  Good venous input function. Perfusion maps demonstrate decreased cerebral blood flow and cerebral blood volume in the right MCA territory centered at the insula and operculum.  ROI measurements in the right MCA / opercular region reveal: - Cerebral blood volume 1.8 mL/100g (versus 3.20 left MCA) - Cerebral blood flow 7.1 mL/100g/min (versus 26.3 left MCA) - Mean transit time 20.2 seconds (versus 12.4 seconds) - Time to peak 23.9 seconds. (versus a 36.3 seconds).  IMPRESSION: 1.  CT perfusion suggests right MCA infarct core centered at the insula/operculum. Findings reviewed in person with Dr. Onalee Hua at 1045 hours on 07/03/2013. 2.  Questionable benign oligemia in the contralateral left MCA territory, versus artifact due to cross suboptimal arterial input function. 3. Attempted CTA head and neck report is below.  CT ANGIOGRAPHY HEAD AND NECK  Technique:  Multidetector CT imaging of the head and neck was performed using the standard protocol during bolus administration of intravenous contrast.  Multiplanar CT image reconstructions including MIPs were obtained to evaluate the vascular anatomy. Carotid stenosis measurements (when  applicable) are obtained utilizing NASCET criteria, using the distal internal carotid diameter as the denominator.  CTA NECK  Findings:  Early contrast bolus timing, such that there is no aortic arch or carotid/vertebral artery contrast at the time of image acquisition. After discussing with Dr. Amada Jupiter, decision was made to defer any repeat CTA attempt and proceed with a CT brain perfusion study (report above).  Mild ground-glass opacity in the visualized upper lobes. Mediastinal lipomatosis.  Small superior mediastinal lymph nodes are within normal limits.  Atelectatic changes to the trachea. Negative thyroid.  Larynx and pharynx within normal limits. Parapharyngeal, retropharyngeal (incidental bilateral carotid artery retropharyngeal course), and sublingual spaces are within normal limits.  Negative submandibular and parotid glands. Postoperative changes to the orbits.  Chronic right lamina papyracea fracture. Visualized paranasal sinuses and mastoids are clear.  No cervical lymphadenopathy identified.  Widespread degenerative changes in the cervical spine with disc space loss, occasional vacuum disc phenomena, and endplate spurring. No acute osseous abnormality identified.  Mild calcified atherosclerosis of the aortic arch.  Mild calcified atherosclerosis of the proximal great vessels.  In adequate intraarterial contrast timing in the neck.  Calcified atherosclerosis of both carotid bifurcations.   Review of the MIP images confirms the above findings.  IMPRESSION: 1.  Inadequate intraarterial contrast bolus timing.  Calcified atherosclerosis in the neck.   After discussing with Dr. Amada Jupiter, decision was made to defer any repeat CTA attempt and proceed with a CT brain perfusion study (reported above). Incidental retropharyngeal course of both cervical carotid arteries. 2.  See intracranial findings below. 3.  Widespread cervical spine degeneration.  CTA HEAD  Findings:  Postcontrast images of the head  redemonstrated left MCA territory chronic encephalomalacia.  No abnormal hyperenhancement. No intracranial mass effect.  No ventriculomegaly.  Hypodensity at the right insula re-identified.  Decreased visualization of the right MCA M1 segment compared to the left.  Major intracranial venous structures appear to be normally enhancing.  Calvarium intact. Visualized scalp soft tissues are within normal limits.  Extensive left greater than right ICA siphon calcified atherosclerosis.  In adequate intraarterial contrast timing.   Review of the MIP images confirms the above findings.  IMPRESSION: 1.  Evidence of acute cortically based infarct at the right insula. No mass effect or hemorrhage.  Postcontrast images do demonstrate decreased visualization of the right MCA M1 segment.  2.  Inadequate intraarterial contrast timing.  Left worse than right widespread ICA siphon calcified atherosclerosis. After discussing with Dr. Amada Jupiter, decision was made to defer any repeat CTA attempt and proceed with a CT brain perfusion study (reported above). 3.  Chronic left MCA ischemia.   Original Report Authenticated By: Erskine Speed, M.D.   Dg Chest 2 View  07/03/2013   *RADIOLOGY REPORT*  Clinical Data: Stroke patient, history hypertension, diabetes, smoking  CHEST - 2 VIEW  Comparison: None.  Findings: No active infiltrate or effusion is seen.  Probable linear scarring or atelectasis is noted in the right mid lung. Moderate cardiomegaly is present.  There are diffuse degenerative changes throughout the thoracic spine.  IMPRESSION: Moderate cardiomegaly.  No active lung disease.   Original Report Authenticated By: Dwyane Dee, M.D.   Ct Angio Neck W/cm &/or Wo/cm  07/03/2013   *RADIOLOGY REPORT*  Clinical Data: 62 year old male Code stroke with left facial droop, right MCA territory symptoms.  Known decreased cardiac ejection fraction.  Contrast:  100 ml Omnipaque 350 (50 ml for attempted CTA, 50 ml for subsequent CT  perfusion).  Comparison: Head CT without contrast 07/03/2013.  CT CEREBRAL PERFUSION WITH CONTRAST  Technique: Multidetector CT brain perfusion study performed during bolus IV contrast administration. Data analysis and post processing performed.   Findings: Suboptimal but felt to be diagnostic arterial input function.  Good venous input function. Perfusion maps demonstrate decreased cerebral blood flow and cerebral blood volume in the right MCA territory centered at the insula and operculum.  ROI measurements in the right MCA / opercular region reveal: - Cerebral blood volume 1.8 mL/100g (versus 3.20 left MCA) - Cerebral blood flow 7.1 mL/100g/min (versus 26.3 left MCA) - Mean transit time 20.2 seconds (versus 12.4 seconds) - Time to peak 23.9 seconds. (versus a 36.3 seconds).  IMPRESSION: 1.  CT perfusion suggests right MCA infarct core centered at the insula/operculum. Findings reviewed in person with Dr. Onalee Hua at 1045 hours on 07/03/2013. 2.  Questionable benign oligemia in the contralateral left MCA territory, versus artifact due to cross suboptimal arterial input function. 3. Attempted CTA head and neck report is below.  CT ANGIOGRAPHY  HEAD AND NECK  Technique:  Multidetector CT imaging of the head and neck was performed using the standard protocol during bolus administration of intravenous contrast.  Multiplanar CT image reconstructions including MIPs were obtained to evaluate the vascular anatomy. Carotid stenosis measurements (when applicable) are obtained utilizing NASCET criteria, using the distal internal carotid diameter as the denominator.  CTA NECK  Findings:  Early contrast bolus timing, such that there is no aortic arch or carotid/vertebral artery contrast at the time of image acquisition. After discussing with Dr. Amada Jupiter, decision was made to defer any repeat CTA attempt and proceed with a CT brain perfusion study (report above).  Mild ground-glass opacity in the visualized upper  lobes. Mediastinal lipomatosis.  Small superior mediastinal lymph nodes are within normal limits.  Atelectatic changes to the trachea. Negative thyroid.  Larynx and pharynx within normal limits. Parapharyngeal, retropharyngeal (incidental bilateral carotid artery retropharyngeal course), and sublingual spaces are within normal limits.  Negative submandibular and parotid glands. Postoperative changes to the orbits.  Chronic right lamina papyracea fracture. Visualized paranasal sinuses and mastoids are clear.  No cervical lymphadenopathy identified.  Widespread degenerative changes in the cervical spine with disc space loss, occasional vacuum disc phenomena, and endplate spurring. No acute osseous abnormality identified.  Mild calcified atherosclerosis of the aortic arch.  Mild calcified atherosclerosis of the proximal great vessels.  In adequate intraarterial contrast timing in the neck.  Calcified atherosclerosis of both carotid bifurcations.   Review of the MIP images confirms the above findings.  IMPRESSION: 1.  Inadequate intraarterial contrast bolus timing.  Calcified atherosclerosis in the neck.   After discussing with Dr. Amada Jupiter, decision was made to defer any repeat CTA attempt and proceed with a CT brain perfusion study (reported above). Incidental retropharyngeal course of both cervical carotid arteries. 2.  See intracranial findings below. 3.  Widespread cervical spine degeneration.  CTA HEAD  Findings:  Postcontrast images of the head redemonstrated left MCA territory chronic encephalomalacia.  No abnormal hyperenhancement. No intracranial mass effect.  No ventriculomegaly.  Hypodensity at the right insula re-identified.  Decreased visualization of the right MCA M1 segment compared to the left.  Major intracranial venous structures appear to be normally enhancing.  Calvarium intact. Visualized scalp soft tissues are within normal limits.  Extensive left greater than right ICA siphon calcified  atherosclerosis.  In adequate intraarterial contrast timing.   Review of the MIP images confirms the above findings.  IMPRESSION: 1.  Evidence of acute cortically based infarct at the right insula. No mass effect or hemorrhage.  Postcontrast images do demonstrate decreased visualization of the right MCA M1 segment.  2.  Inadequate intraarterial contrast timing.  Left worse than right widespread ICA siphon calcified atherosclerosis. After discussing with Dr. Amada Jupiter, decision was made to defer any repeat CTA attempt and proceed with a CT brain perfusion study (reported above). 3.  Chronic left MCA ischemia.   Original Report Authenticated By: Erskine Speed, M.D.   Mr Brain Wo Contrast  07/03/2013   *RADIOLOGY REPORT*  Clinical Data:  Left-sided weakness.  Facial droop.  Hypertensive diabetic patient with history of atrial fibrillation.  MRI BRAIN WITHOUT CONTRAST MRA HEAD WITHOUT CONTRAST  Technique: Multiplanar, multiecho pulse sequences of the brain and surrounding structures were obtained according to standard protocol without intravenous contrast.  Angiographic images of the head were obtained using MRA technique without contrast.  Comparison: CT perfusion examination 07/03/2013.  Cervical spondylotic changes with spinal stenosis and cord flattening C3-4 level.  MRI HEAD  Findings:  Motion degraded exam limited to diffusion imaging and sagittal axial T1 sequence.  Moderate to large-sized right hemispheric acute infarct extends from the right opercular/sub insular region into the right frontal lobe bordering the anterior aspect of the right parietal lobe.  No obvious intracranial hemorrhage although gradient sequence not obtained.  Remote left parietal lobe infarct encephalomalacia.  Remote posterior left lenticular nucleus/posterior limb left internal capsule infarct.  Small vessel disease type changes.  Atrophy without hydrocephalus.  No obvious mass.  IMPRESSION: Motion degraded exam limited to diffusion  imaging and sagittal axial T1 sequence.  Moderate to large-sized right hemispheric acute infarct extends from the right opercular/sub insular region into the right frontal lobe bordering the anterior aspect of the right parietal lobe.  No obvious intracranial hemorrhage although gradient sequence not obtained.  Remote left parietal lobe infarct encephalomalacia.  Remote posterior left lenticular nucleus/posterior limb left internal capsule infarct.  Small vessel disease type changes.  Atrophy without hydrocephalus.  MRA HEAD  Findings: Exam significantly limited by motion.  Grading stenosis or evaluating for aneurysm is limited.  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  Flow is noted within the internal carotid arteries with suggestion of moderate narrowing of the pre cavernous and cavernous segment of the left internal carotid artery with mild narrowing and ectasia of the supraclinoid aspect of the internal carotid artery bilaterally.  Limited evaluation of branch vessels with suggestion of significant intracranial atherosclerotic type changes.  Aneurysm cannot be excluded or confirmed on the present exam.  IMPRESSION: Markedly motion degraded exam.  Please see above.  This has been made a PRA call report utilizing dashboard call feature.   Original Report Authenticated By: Lacy Duverney, M.D.   Ct Cerebral Perfusion W/cm  07/03/2013   *RADIOLOGY REPORT*  Clinical Data: 62 year old male Code stroke with left facial droop, right MCA territory symptoms.  Known decreased cardiac ejection fraction.  Contrast:  100 ml Omnipaque 350 (50 ml for attempted CTA, 50 ml for subsequent CT perfusion).  Comparison: Head CT without contrast 07/03/2013.  CT CEREBRAL PERFUSION WITH CONTRAST  Technique: Multidetector CT brain perfusion study performed during bolus IV contrast administration. Data analysis and post processing  performed.   Findings: Suboptimal but felt to be diagnostic arterial input function.  Good venous input function. Perfusion maps demonstrate decreased cerebral blood flow and cerebral blood volume in the right MCA territory centered at the insula and operculum.  ROI measurements in the right MCA / opercular region reveal: - Cerebral blood volume 1.8 mL/100g (versus 3.20 left MCA) - Cerebral blood flow 7.1 mL/100g/min (versus 26.3 left MCA) - Mean transit time 20.2 seconds (versus 12.4 seconds) - Time to peak 23.9 seconds. (versus a 36.3 seconds).  IMPRESSION: 1.  CT perfusion suggests right MCA infarct core centered at the insula/operculum. Findings reviewed in person with Dr. Onalee Hua at 1045 hours on 07/03/2013. 2.  Questionable benign oligemia in the contralateral left MCA territory, versus artifact due to cross suboptimal arterial input function. 3. Attempted CTA head and neck report is below.  CT ANGIOGRAPHY HEAD AND NECK  Technique:  Multidetector CT imaging of the head and neck was performed using the standard protocol during bolus administration of intravenous contrast.  Multiplanar CT image reconstructions including MIPs were obtained to evaluate the vascular anatomy. Carotid stenosis measurements (when applicable) are obtained utilizing NASCET criteria, using the distal internal carotid diameter as the denominator.  CTA NECK  Findings:  Early contrast bolus timing, such that there is no aortic arch or carotid/vertebral artery contrast at the time of image acquisition. After discussing with Dr. Amada Jupiter, decision was made to defer any repeat CTA attempt and proceed with a CT brain perfusion study (report above).  Mild ground-glass opacity in the visualized upper lobes. Mediastinal lipomatosis.  Small superior mediastinal lymph nodes are within normal limits.  Atelectatic changes to the trachea. Negative thyroid.  Larynx and pharynx within normal limits. Parapharyngeal, retropharyngeal  (incidental bilateral carotid artery retropharyngeal course), and sublingual spaces are within normal limits.  Negative submandibular and parotid glands. Postoperative changes to the orbits.  Chronic right lamina papyracea fracture. Visualized paranasal sinuses and mastoids are clear.  No cervical lymphadenopathy identified.  Widespread degenerative changes in the cervical spine with disc space loss, occasional vacuum disc phenomena, and endplate spurring. No acute osseous abnormality identified.  Mild calcified atherosclerosis of the aortic arch.  Mild calcified atherosclerosis of the proximal great vessels.  In adequate intraarterial contrast timing in the neck.  Calcified atherosclerosis of both carotid bifurcations.   Review of the MIP images confirms the above findings.  IMPRESSION: 1.  Inadequate intraarterial contrast bolus timing.  Calcified atherosclerosis in the neck.   After discussing with Dr. Amada Jupiter, decision was made to defer any repeat CTA attempt and proceed with a CT brain perfusion study (reported above). Incidental retropharyngeal course of both cervical carotid arteries. 2.  See intracranial findings below. 3.  Widespread cervical spine degeneration.  CTA HEAD  Findings:  Postcontrast images of the head redemonstrated left MCA territory chronic encephalomalacia.  No abnormal hyperenhancement. No intracranial mass effect.  No ventriculomegaly.  Hypodensity at the right insula re-identified.  Decreased visualization of the right MCA M1 segment compared to the left.  Major intracranial venous structures appear to be normally enhancing.  Calvarium intact. Visualized scalp soft tissues are within normal limits.  Extensive left greater than right ICA siphon calcified atherosclerosis.  In adequate intraarterial contrast timing.   Review of the MIP images confirms the above findings.  IMPRESSION: 1.  Evidence of acute cortically based infarct at the right insula. No mass effect or hemorrhage.   Postcontrast images do demonstrate decreased visualization of the right MCA M1 segment.  2.  Inadequate intraarterial contrast timing.  Left worse than right widespread ICA siphon calcified atherosclerosis. After discussing with Dr. Amada Jupiter, decision was made to defer any repeat CTA attempt and proceed with a CT brain perfusion study (reported above). 3.  Chronic left MCA ischemia.   Original Report Authenticated By: Erskine Speed, M.D.   Mr Mra Head/brain Wo Cm  07/03/2013   *RADIOLOGY REPORT*  Clinical Data:  Left-sided weakness.  Facial droop.  Hypertensive diabetic patient with history of atrial fibrillation.  MRI BRAIN WITHOUT CONTRAST MRA HEAD WITHOUT CONTRAST  Technique: Multiplanar, multiecho pulse sequences of the brain and surrounding structures were obtained according to standard protocol without intravenous contrast.  Angiographic images of the head were obtained using MRA technique without contrast.  Comparison: CT perfusion examination 07/03/2013.  Cervical spondylotic changes with spinal stenosis and cord flattening C3-4 level.  MRI HEAD  Findings:  Motion degraded exam limited to diffusion imaging and sagittal axial T1 sequence.  Moderate to large-sized right hemispheric acute infarct extends from the right opercular/sub insular region into the right frontal lobe bordering the anterior aspect of the right parietal lobe.  No obvious intracranial hemorrhage although gradient sequence not obtained.  Remote left parietal lobe infarct encephalomalacia.  Remote posterior left lenticular nucleus/posterior limb left internal capsule infarct.  Small vessel disease type changes.  Atrophy without hydrocephalus.  No obvious mass.  IMPRESSION: Motion degraded exam limited to diffusion imaging and sagittal axial T1 sequence.  Moderate to large-sized right hemispheric acute infarct extends from the right opercular/sub insular region into the right frontal lobe bordering the anterior aspect of the right  parietal lobe.  No obvious intracranial hemorrhage although gradient sequence not obtained.  Remote left parietal lobe infarct encephalomalacia.  Remote posterior left lenticular nucleus/posterior limb left internal capsule infarct.  Small vessel disease type changes.  Atrophy without hydrocephalus.  MRA HEAD  Findings: Exam significantly limited by motion.  Grading stenosis or evaluating for aneurysm is limited.  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  Flow is noted within the internal carotid arteries with suggestion of moderate narrowing of the pre cavernous and cavernous segment of the left internal carotid artery with mild narrowing and ectasia of the supraclinoid aspect of the internal carotid artery bilaterally.  Limited evaluation of branch vessels with suggestion of significant intracranial atherosclerotic type changes.  Aneurysm cannot be excluded or confirmed on the present exam.  IMPRESSION: Markedly motion degraded exam.  Please see above.  This has been made a PRA call report utilizing dashboard call feature.   Original Report Authenticated By: Lacy Duverney, M.D.    Scheduled Meds: . allopurinol  100 mg Oral Daily  . aspirin  325 mg Oral Daily  . carvedilol  25 mg Oral BID WC  . enoxaparin (LOVENOX) injection  40 mg Subcutaneous Q24H  . furosemide  40 mg Oral Daily  . insulin aspart  0-5 Units Subcutaneous QHS  . insulin aspart  0-9 Units Subcutaneous TID WC  . isosorbide dinitrate  20 mg Oral Daily  . metolazone  5 mg Oral Daily  . patient's guide to using coumadin book   Does not apply Once  . potassium chloride  40 mEq Oral Once  . pregabalin  75 mg Oral BID  . senna-docusate  2 tablet Oral Daily  . warfarin  7.5 mg Oral ONCE-1800  . warfarin   Does not apply Once  . Warfarin - Pharmacist Dosing Inpatient   Does not apply q1800   Continuous Infusions:     Time spent: 25  MINUTES    Eddie North  [facility] Pager [phone]. If 7PM-7AM, please contact night-coverage at [email or web address], password [facility] 07/05/2013, 11:15 AM  LOS: 2 days

## 2013-07-05 NOTE — Progress Notes (Addendum)
ANTICOAGULATION CONSULT NOTE - Initial Consult  Pharmacy Consult for Coumadin Indication: atrial fibrillation  Allergies  Allergen Reactions  . Actos [Pioglitazone]     Labs:  Recent Labs  07/03/13 0924 07/03/13 0927 07/03/13 0933 07/04/13 1235  HGB 14.3 12.0*  --   --   HCT 42.0 35.6*  --   --   PLT  --  322  --   --   APTT  --  39*  --   --   LABPROT  --  15.2  --   --   INR  --  1.23  --   --   CREATININE 1.70* 1.59*  --  1.24  TROPONINI  --   --  <0.30  --     CrCl is unknown because there is no height on file for the current visit.   Medical History: Past Medical History  Diagnosis Date  . Fibromyalgia   . Atrial fibrillation   . Hypertension   . Osteoarthritis   . COPD (chronic obstructive pulmonary disease)   . Sleep apnea with use of continuous positive airway pressure (CPAP)   . Diabetes mellitus without complication   . Coronary artery disease   . Neuropathy   . Ocular herpes   . Gout   . CKD (chronic kidney disease), stage III   . CHF (congestive heart failure)     EF 15%  . Polysubstance abuse     Hx of  . Chronic pain   . Hyperlipidemia   . Nonischemic cardiomyopathy     Assessment: 62 year old male with a history of Afib, HTN, DM, and CHF (EF=15%) presented from SNF with acute onset of left sided weakness and facial droop.  Beginning Coumadin for Afib and new CVA.  Goal of Therapy:  INR 2-3 Monitor platelets by anticoagulation protocol: Yes   Plan:  1) Coumadin 7.5 mg po x 1 2) Daily INR 3) Coumadin education  Thank you. Okey Regal, PharmD [phone]  07/05/2013,8:24 AM

## 2013-07-05 NOTE — Progress Notes (Signed)
Occupational Therapy Treatment Patient Details Name: Ka Flammer MRN: [id number] DOB: Jan 31, 1951 Today's Date: 07/05/2013 Time: 7829-5621 OT Time Calculation (min): 11 min  OT Assessment / Plan / Recommendation  History of present illness Logyn Kendrick is a 62 y.o. 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 Sided weakness and facial droop.  MRI showed Moderate to large-sized right hemispheric acute infarct extends rom the right opercular/sub insular region into the right frontal lobe bordering the anterior aspect of the right parietal lobe.  Of note, he also has bad left knee arthritis which makes it painful to move and WB through his left leg.     OT comments  Pt with improved Lt inattention - able to correctly identify Rt and Lt extrems with 100% accuracy.  Pt fatigued this pm with limited participation due to this (reports multiple trips to bathroom)  Follow Up Recommendations  SNF    Barriers to Discharge       Equipment Recommendations  None recommended by OT    Recommendations for Other Services    Frequency Min 2X/week   Progress towards OT Goals Progress towards OT goals: Progressing toward goals  Plan Discharge plan remains appropriate    Precautions / Restrictions Precautions Precautions: Fall Precaution Comments: Lt inattention, decreased awareness of deficits   Pertinent Vitals/Pain     ADL  Transfers/Ambulation Related to ADLs: Pt deferred due to fatigue ADL Comments: Pt reports he has been going to [facility] frequently and is exhausted.  He is able to spontaneously identify Rt. Lt extremities with 100% accuracy.  He was able to doff sock with mod A today.  Worked on reaching in prep for LB ADLs.  Pt deferred further activity due to fatigue    OT Diagnosis:    OT Problem List:   OT Treatment Interventions:     OT Goals(current goals can now be found in the care plan section) ADL Goals Pt Will Perform  Grooming: with min assist;standing Pt Will Perform Upper Body Bathing: with supervision;sitting Pt Will Perform Lower Body Bathing: with min assist;sit to/from stand Pt Will Perform Upper Body Dressing: with supervision;sitting Pt Will Perform Lower Body Dressing: with mod assist;sit to/from stand Pt Will Transfer to Toilet: with min assist;ambulating;regular height toilet;grab bars;bedside commode Pt Will Perform Toileting - Clothing Manipulation and hygiene: with min assist;sit to/from stand Additional ADL Goal #1: Pt will correctly identify Lt UE and LE spontaneously and consistently  Additional ADL Goal #2: Pt will locate all items needed for BADLs  Visit Information  Last OT Received On: 07/05/13 Assistance Needed: +2 History of Present Illness: Rudolph Daoust is a 62 y.o. 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 Sided weakness and facial droop.  MRI showed Moderate to large-sized right hemispheric acute infarct extends rom the right opercular/sub insular region into the right frontal lobe bordering the anterior aspect of the right parietal lobe.  Of note, he also has bad left knee arthritis which makes it painful to move and WB through his left leg.      Subjective Data      Prior Functioning       Cognition  Cognition Arousal/Alertness: Awake/alert Behavior During Therapy: WFL for tasks assessed/performed Overall Cognitive Status: Impaired/Different from baseline Area of Impairment: Attention;Safety/judgement;Awareness Current Attention Level: Selective Safety/Judgement: Decreased awareness of safety;Decreased awareness of deficits Awareness: Intellectual    Mobility  Bed Mobility  Bed Mobility: Not assessed Transfers Transfers: Not assessed    Exercises      Balance Dynamic Sitting Balance Dynamic Sitting - Balance Support: Feet supported Dynamic Sitting - Level of Assistance: 5: Stand by  assistance Dynamic Sitting - Balance Activities: Reaching for objects   End of Session OT - End of Session Activity Tolerance: Patient limited by fatigue Patient left: in chair;with call bell/phone within reach;with family/visitor present  GO     Melquan Ernsberger, Ursula Alert M 07/05/2013, 3:24 PM

## 2013-07-06 ENCOUNTER — Inpatient Hospital Stay (HOSPITAL_COMMUNITY): Payer: Medicare Other

## 2013-07-06 DIAGNOSIS — I428 Other cardiomyopathies: Secondary | ICD-10-CM

## 2013-07-06 LAB — GLUCOSE, CAPILLARY
Glucose-Capillary: 125 mg/dL — ABNORMAL HIGH (ref 70–99)
Glucose-Capillary: 99 mg/dL (ref 70–99)
Glucose-Capillary: 157 mg/dL — ABNORMAL HIGH (ref 70–99)

## 2013-07-06 LAB — BASIC METABOLIC PANEL
BUN: 17 mg/dL (ref 6–23)
Chloride: 93 mEq/L — ABNORMAL LOW (ref 96–112)
Creatinine, Ser: 1.24 mg/dL (ref 0.50–1.35)
GFR calc Af Amer: 70 mL/min — ABNORMAL LOW (ref >90)

## 2013-07-06 LAB — PROTIME-INR
INR: 1.31 (ref 0.00–1.49)
Prothrombin Time: 16 seconds — ABNORMAL HIGH (ref 11.6–15.2)

## 2013-07-06 MED ORDER — OXYCODONE-ACETAMINOPHEN 5-325 MG PO TABS
2.0000 | ORAL_TABLET | Freq: Once | ORAL | Status: AC
  Administered 2013-07-06: 2 via ORAL
  Filled 2013-07-06: qty 2

## 2013-07-06 MED ORDER — WARFARIN SODIUM 7.5 MG PO TABS
7.5000 mg | ORAL_TABLET | Freq: Once | ORAL | Status: AC
  Administered 2013-07-06: 7.5 mg via ORAL
  Filled 2013-07-06: qty 1

## 2013-07-06 MED ORDER — PREDNISONE 20 MG PO TABS
40.0000 mg | ORAL_TABLET | Freq: Every day | ORAL | Status: DC
  Administered 2013-07-06 – 2013-07-09 (×4): 40 mg via ORAL
  Filled 2013-07-06 (×5): qty 2

## 2013-07-06 MED ORDER — DEXTROSE 5 % IV SOLN
1.0000 g | INTRAVENOUS | Status: DC
  Administered 2013-07-06 – 2013-07-09 (×4): 1 g via INTRAVENOUS
  Filled 2013-07-06 (×5): qty 10

## 2013-07-06 MED ORDER — OXYCODONE-ACETAMINOPHEN 5-325 MG PO TABS
1.0000 | ORAL_TABLET | Freq: Four times a day (QID) | ORAL | Status: DC | PRN
  Administered 2013-07-07 – 2013-07-09 (×8): 1 via ORAL
  Filled 2013-07-06 (×8): qty 1

## 2013-07-06 MED ORDER — FUROSEMIDE 10 MG/ML IJ SOLN
80.0000 mg | Freq: Two times a day (BID) | INTRAMUSCULAR | Status: AC
  Administered 2013-07-06 – 2013-07-07 (×4): 80 mg via INTRAVENOUS
  Filled 2013-07-06 (×4): qty 8

## 2013-07-06 NOTE — Progress Notes (Signed)
Rehab Admissions Coordinator Note:  Patient was screened by Brock Ra for appropriateness for an Inpatient Acute Rehab Consult.  At this time, we are recommending Skilled Nursing Facility. Pt came from a SNF- Spoke w/ in house reviewer for pt's insurance, who said SNF.  Brock Ra 07/06/2013, 10:20 AM  I can be reached at [phone].

## 2013-07-06 NOTE — Progress Notes (Signed)
ANTICOAGULATION CONSULT NOTE   Pharmacy Consult for Coumadin Indication: atrial fibrillation  Allergies  Allergen Reactions  . Actos [Pioglitazone]     Labs:  Recent Labs  07/03/13 0924 07/03/13 0927 07/03/13 0933 07/04/13 1235 07/05/13 0715 07/06/13 0635  HGB 14.3 12.0*  --   --   --   --   HCT 42.0 35.6*  --   --   --   --   PLT  --  322  --   --   --   --   APTT  --  39*  --   --   --   --   LABPROT  --  15.2  --   --   --  16.0*  INR  --  1.23  --   --   --  1.31  CREATININE 1.70* 1.59*  --  1.24 1.13  --   TROPONINI  --   --  <0.30  --   --   --     Estimated Creatinine Clearance: 82.1 ml/min (by C-G formula based on Cr of 1.13).  Assessment: 62 year old male with a history of Afib, HTN, DM, and CHF (EF=15%) presented from SNF with acute onset of left sided weakness and facial droop.  Beginning Coumadin for Afib and new CVA.  INR today = 1.31  Goal of Therapy:  INR 2-3 Monitor platelets by anticoagulation protocol: Yes   Plan:  1) Coumadin 7.5 mg po x 1 2) Daily INR 3) Coumadin education  Thank you. Okey Regal, PharmD [phone]  07/06/2013,8:34 AM

## 2013-07-06 NOTE — Progress Notes (Signed)
Physical Therapy Treatment Patient Details Name: Frank Davidson MRN: [id number] DOB: 08-18-1951 Today's Date: 07/06/2013 Time: 4098-1191 PT Time Calculation (min): 30 min  PT Assessment / Plan / Recommendation  History of Present Illness Frank Davidson is a 62 y.o. 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 Sided weakness and facial droop.  MRI showed Moderate to large-sized right hemispheric acute infarct extends rom the right opercular/sub insular region into the right frontal lobe bordering the anterior aspect of the right parietal lobe.  Of note, he also has bad left knee arthritis which makes it painful to move and WB through his left leg.     PT Comments   Max encouragement by therapist and family to participate with therapist to work on walking and leg strength today.  Pt needed extensive education on why we needed to move here in the hospital and why he could not just wait until he went back to [facility] to start his therapy.  Pt was finally agreeable, but is very deconditioned compared to what he reports doing at [facility].  PT will continue to follow acutely to encourage gait and mobility.    Follow Up Recommendations  SNF     Does the patient have the potential to tolerate intense rehabilitation    NA  Barriers to Discharge   None      Equipment Recommendations  None recommended by PT    Recommendations for Other Services   None  Frequency Min 3X/week   Progress towards PT Goals Progress towards PT goals: Progressing toward goals  Plan Current plan remains appropriate    Precautions / Restrictions Precautions Precautions: Fall Precaution Comments: Lt inattention, decreased awareness of deficits Required Braces or Orthoses: Other Brace/Splint Other Brace/Splint: has heel cushion boot right foot.    Pertinent Vitals/Pain DOE with mobility 3/4 with O2 via [place] on entire treatment session    Mobility  Bed Mobility Bed Mobility: Not assessed (per sister, he sleeps in recliner due to breathing issues) Transfers Sit to Stand: 3: Mod assist;With upper extremity assist;With armrests;From chair/3-in-1 Stand to Sit: 3: Mod assist;With upper extremity assist;With armrests;To chair/3-in-1 Details for Transfer Assistance: Stood x 2 with RW mod assist.  Max verbal cues of encouragement to participate.   Ambulation/Gait Ambulation/Gait Assistance: 4: Min assist Ambulation Distance (Feet): 5 Feet Assistive device: Rolling walker Ambulation/Gait Assistance Details: 5' due to pt did not want to go further due to left knee pain and bil leg weakness Gait Pattern: Step-to pattern;Antalgic;Trunk flexed General Gait Details: Increased DOE with even small amounts of mobility.  Kept O2 [place] on during treatment session Modified Rankin (Stroke Patients Only) Pre-Morbid Rankin Score: Moderately severe disability Modified Rankin: Moderately severe disability      PT Goals (current goals can now be found in the care plan section) Acute Rehab PT Goals Patient Stated Goal: to get back to independence  Visit Information  Last PT Received On: 07/06/13 Assistance Needed: +2 (chair to follow for gait) History of Present Illness: Frank Davidson is a 62 y.o. 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 Sided weakness and facial droop.  MRI showed Moderate to large-sized right hemispheric acute infarct extends rom the right opercular/sub insular region into the right frontal lobe bordering the anterior aspect of the right parietal lobe.  Of note, he also has bad left knee arthritis which makes it painful to move and  WB through his left leg.      Subjective Data  Subjective: Pt able to recognize and report sister and niece in room.   Patient Stated Goal: to get back to independence   Cognition  Cognition Arousal/Alertness: Lethargic (woke him from sleeping  today) Behavior During Therapy: [facility] for tasks assessed/performed Overall Cognitive Status: Impaired/Different from baseline Area of Impairment: Attention;Safety/judgement;Awareness Orientation Level: Time;Disoriented to Current Attention Level: Selective Memory: Decreased short-term memory Safety/Judgement: Decreased awareness of safety;Decreased awareness of deficits Awareness: Intellectual Problem Solving: Slow processing;Difficulty sequencing;Requires verbal cues;Requires tactile cues General Comments: Pt with increased difficulty and frustration wtih speech today (expressive difficulties noted)       End of Session PT - End of Session Equipment Utilized During Treatment: Gait belt Activity Tolerance: Patient limited by fatigue;Patient limited by pain Patient left: in chair;with call bell/phone within reach;with family/visitor present    Lurena Joiner B. Kalle Bernath, PT, DPT [phone]   07/06/2013, 4:09 PM

## 2013-07-06 NOTE — Progress Notes (Signed)
Stroke Team Progress Note  HISTORY Frank Davidson is a 62 y.o. male with a history of severe CHF who went to bed normal at 2159 8/11. He reports waking up normal around 6:30 am 8/12, however this is not clear given that he denies his current deficits as well. He had a breathing treatment this morning, though it is unclear how much assessment was actually done at that time and given that his predominant symptoms are in his leg and he does not recognize his deficits, I do not feel that this is a reliable time of onset. Later, it was noted that he had a left facial droop and weakness. Speaking with his daughter, he was having some problems with his left leg even prior to todays event and it is unclear if this was weakness or pain.   Patient was not a TPA candidate secondary to delay in arrival. Acutely performed CT perfusion revealed some penumbra along with significant infarct. Risk outweigh benefits for interventions, therefore, were not offered.  He was admitted for further evaluation and treatment.  SUBJECTIVE Patient stting on edge of bed. Family at the bedside. They met with Dr. Katrinka Blazing yesterday.  OBJECTIVE Most recent Vital Signs: Filed Vitals:   07/05/13 2142 07/06/13 0135 07/06/13 0503 07/06/13 1030  BP: 143/61 136/81 151/84 102/71  Pulse: 81 87 83 80  Temp: 97.4 F (36.3 C) 98.3 F (36.8 C) 97.9 F (36.6 C) 98 F (36.7 C)  TempSrc: Oral Oral Oral Oral  Resp: 20 20 20 20   Height: 5\' 7"  (1.702 m)     Weight: 117.799 kg (259 lb 11.2 oz)     SpO2: 99% 99% 97% 100%   CBG (last 3)   Recent Labs  07/05/13 1631 07/05/13 2144 07/06/13 0642  GLUCAP 151* 150* 125*   IV Fluid Intake:     MEDICATIONS  . allopurinol  100 mg Oral Daily  . aspirin  325 mg Oral Daily  . carvedilol  25 mg Oral BID WC  . cefTRIAXone (ROCEPHIN)  IV  1 g Intravenous Q24H  . enoxaparin (LOVENOX) injection  40 mg Subcutaneous Q24H  . furosemide  80 mg Intravenous BID  . insulin aspart  0-5 Units  Subcutaneous QHS  . insulin aspart  0-9 Units Subcutaneous TID WC  . isosorbide dinitrate  20 mg Oral Daily  . potassium chloride  40 mEq Oral Once  . pregabalin  75 mg Oral BID  . senna-docusate  2 tablet Oral Daily  . warfarin  7.5 mg Oral ONCE-1800  . Warfarin - Pharmacist Dosing Inpatient   Does not apply q1800   PRN:  acetaminophen, acetaminophen, levalbuterol  Diet:  Dysphagia 2 thin liquids Activity:  OOB with assistance DVT Prophylaxis:  Lovenox 40 mg sq daily   CLINICALLY SIGNIFICANT STUDIES Basic Metabolic Panel:   Recent Labs Lab 07/05/13 0715 07/06/13 1025  NA 134* 131*  K 3.1* 3.6  CL 95* 93*  CO2 25 26  GLUCOSE 127* 164*  BUN 16 17  CREATININE 1.13 1.24  CALCIUM 9.6 9.7   Liver Function Tests:   Recent Labs Lab 07/03/13 0927  AST 33  ALT 26  ALKPHOS 120*  BILITOT 0.9  PROT 7.6  ALBUMIN 3.0*   CBC:   Recent Labs Lab 07/03/13 0924 07/03/13 0927  WBC  --  5.8  NEUTROABS  --  4.2  HGB 14.3 12.0*  HCT 42.0 35.6*  MCV  --  82.2  PLT  --  322   Coagulation:  Recent Labs Lab 07/03/13 0927 07/06/13 0635  LABPROT 15.2 16.0*  INR 1.23 1.31   Cardiac Enzymes:   Recent Labs Lab 07/03/13 0933  TROPONINI <0.30   Urinalysis:   Recent Labs Lab 07/03/13 1922 07/05/13 1808  COLORURINE YELLOW AMBER*  LABSPEC 1.027 1.015  PHURINE 7.0 7.5  GLUCOSEU NEGATIVE NEGATIVE  HGBUR NEGATIVE LARGE*  BILIRUBINUR NEGATIVE SMALL*  KETONESUR NEGATIVE NEGATIVE  PROTEINUR NEGATIVE 30*  UROBILINOGEN 0.2 1.0  NITRITE NEGATIVE NEGATIVE  LEUKOCYTESUR NEGATIVE TRACE*   Lipid Panel    Component Value Date/Time   CHOL 95 07/04/2013 0645   TRIG 66 07/04/2013 0645   HDL 27* 07/04/2013 0645   CHOLHDL 3.5 07/04/2013 0645   VLDL 13 07/04/2013 0645   LDLCALC 55 07/04/2013 0645   HgbA1C  Lab Results  Component Value Date   HGBA1C 6.0* 07/04/2013    Urine Drug Screen:     Component Value Date/Time   LABOPIA NONE DETECTED 07/03/2013 1922   COCAINSCRNUR  NONE DETECTED 07/03/2013 1922   LABBENZ NONE DETECTED 07/03/2013 1922   AMPHETMU NONE DETECTED 07/03/2013 1922   THCU NONE DETECTED 07/03/2013 1922   LABBARB NONE DETECTED 07/03/2013 1922    Alcohol Level:   Recent Labs Lab 07/03/13 0927  ETH <11    CT of the brain  07/03/2013   Ill-defined hypodensity and edema in the right insular cortex compatible with acute infarct.  Negative for hemorrhage.    CT Angio Head 07/03/2013 1.  Evidence of acute cortically based infarct at the right insula. No mass effect or hemorrhage.  Postcontrast images do demonstrate decreased visualization of the right MCA M1 segment.  2.  Inadequate intraarterial contrast timing.  Left worse than right widespread ICA siphon calcified atherosclerosis. After discussing with Dr. Amada Jupiter, decision was made to defer any repeat CTA attempt and proceed with a CT brain perfusion study (reported above). 3.  Chronic left MCA ischemia.      Ct Angio Neck 07/03/2013   1.  Inadequate intraarterial contrast bolus timing.  Calcified atherosclerosis in the neck.   After discussing with Dr. Amada Jupiter, decision was made to defer any repeat CTA attempt and proceed with a CT brain perfusion study (reported above). Incidental retropharyngeal course of both cervical carotid arteries. 2.  See intracranial findings below. 3.  Widespread cervical spine degeneration.     CT Cerebral Perfusion  07/03/2013    1.  CT perfusion suggests right MCA infarct core centered at the insula/operculum. Findings reviewed in person with Dr. Onalee Hua at 1045 hours on 07/03/2013. 2.  Questionable benign oligemia in the contralateral left MCA territory, versus artifact due to cross suboptimal arterial input function.   MRI of the brain  07/03/2013  Motion degraded exam limited to diffusion imaging and sagittal axial T1 sequence.  Moderate to large-sized right hemispheric acute infarct extends from the right opercular/sub insular region into the right frontal  lobe bordering the anterior aspect of the right parietal lobe.  No obvious intracranial hemorrhage although gradient sequence not obtained.  Remote left parietal lobe infarct encephalomalacia.  Remote posterior left lenticular nucleus/posterior limb left internal capsule infarct.  Small vessel disease type changes.  Atrophy without hydrocephalus.  MRA of the brain  07/03/2013    Markedly motion degraded exam.   2D Echocardiogram  EF 20-25% with no source of embolus. Diffuse hypokinesis  Carotid Doppler  Study was very technically difficult due to patient position, poor patient cooperation, body habitus, and depth of vessels. Due to technical limitations,  the study was inconclusive.  CXR  07/03/2013    Moderate cardiomegaly.  No active lung disease.   EKG  atrial fibrillation, HR 80  Therapy Recommendations SNF  Physical Exam   Middle aged african [place] male not in distress.Awake alert. Afebrile. Head is nontraumatic. Neck is supple without bruit. Hearing is normal. Cardiac exam no murmur or gallop. Lungs are clear to auscultation. Distal pulses are well felt. Neurological Exam : Awake alert oriented x 3 normal speech and language. Mild left lower face asymmetry. Tongue midline. No drift. Mild diminished fine finger movements on left. Orbits right over left upper extremity. Mild left grip weak..  tremor left upper extremity greater than right which is  mild and irregular   Normal sensation . Normal coordination.  ASSESSMENT Mr. Alfons Sulkowski is a 62 y.o. male presenting with left sided weakness from a SNF.  Imaging confirms a moderate to large-sized right hemispheric acute right opercular/sub insular, frontal lobe infarct in setting of old left parietal lobe and posterior left lenticular nucleus/posterior limb left internal capsule infarcts. Infarcts felt to be embolic secondary to known afib, low EF.  On aspirin 325 mg orally every day prior to admission. Now on aspirin 325 mg orally every day  and warfarin for secondary stroke prevention, INR 1.31. Patient with resultant left hemiparesis, dysarthria, dysphagia. Work up completed.  Myoclonic tremors, left greater than right. No hx of similar event. Has hx chronic RF stage 3, also on neurontin and oxycontin. Both of these drugs can lead to tremor - discontinued - now with significant improvement in tremor atrial fibrillation, not on anticoagulation prior to admission, reason unknown  Hypertension Hyperlipidemia, LDL 55, on no statin PTA, now on no statin, at goal LDL < 70 for diabetics Diabetes, HgbA1c 6.0, at goal < 7.0 CAD NICM, reported EF 15% in the past, now 20-25% OSA with use of CPAP Hx poly substance abuse Fibromyalgia COPD Widespread cervical spine degeneration Chronic kidney disease, Cr now 1.13  Hospital day # 3  TREATMENT/PLAN  Continue aspirin 325 mg orally every day for secondary stroke prevention until INR therapeutic. Coumadin daily for secondary stroke prevention, goal 2.0-3.0Continue to watch myoclonic tremors  Do not resume neurontin or oxycontin as their cessation has led to resolution of myoclonic tremors No further stroke workup indicated. Patient has a 10-15% risk of having another stroke over the next year, the highest risk is within 2 weeks of the most recent stroke/TIA (risk of having a stroke following a stroke or TIA is the same). Ongoing risk factor control by Primary Care Physician Stroke Service will sign off. Please call should any needs arise. Follow up with Dr. Pearlean Brownie, Stroke Clinic, in 2 months.  Dr. Pearlean Brownie discussed diagnosis, prognosis,  treatment options and plan of care with family at bedside.   Annie Main, MSN, RN, ANVP-BC, ANP-BC, [facility] Pager: [phone] 07/06/2013 11:52 AM  I have personally obtained a history, examined the patient, evaluated imaging results, and formulated the assessment and plan of care. I agree with the above. Delia Heady, MD

## 2013-07-06 NOTE — Progress Notes (Addendum)
Patient Name: Frank Davidson Date of Encounter: 07/06/2013    SUBJECTIVE: Difficulty sleeping last night due to dyspnea and recurrent interruptions.  TELEMETRY:  Atrial fibrillation with controlled rate: Filed Vitals:   07/05/13 2039 07/05/13 2142 07/06/13 0135 07/06/13 0503  BP:  143/61 136/81 151/84  Pulse:  81 87 83  Temp:  97.4 F (36.3 C) 98.3 F (36.8 C) 97.9 F (36.6 C)  TempSrc:  Oral Oral Oral  Resp:  20 20 20   Height:  5\' 7"  (1.702 m)    Weight:  117.799 kg (259 lb 11.2 oz)    SpO2: 98% 99% 99% 97%    Intake/Output Summary (Last 24 hours) at 07/06/13 0746 Last data filed at 07/05/13 2143  Gross per 24 hour  Intake      0 ml  Output    900 ml  Net   -900 ml    LABS: Basic Metabolic Panel:  Recent Labs  16/10/96 1235 07/05/13 0715  NA 136 134*  K 3.5 3.1*  CL 96 95*  CO2 30 25  GLUCOSE 135* 127*  BUN 18 16  CREATININE 1.24 1.13  CALCIUM 9.4 9.6   CBC:  Recent Labs  07/03/13 0924 07/03/13 0927  WBC  --  5.8  NEUTROABS  --  4.2  HGB 14.3 12.0*  HCT 42.0 35.6*  MCV  --  82.2  PLT  --  322   Cardiac Enzymes:  Recent Labs  07/03/13 0933  TROPONINI <0.30   BNP No results found for this basename: probnp   Hemoglobin A1C:  Recent Labs  07/04/13 0645  HGBA1C 6.0*   Fasting Lipid Panel:  Recent Labs  07/04/13 0645  CHOL 95  HDL 27*  LDLCALC 55  TRIG 66  CHOLHDL 3.5    Radiology/Studies:   last chest x-ray was on the 12th and did not reveal pulmonary congestion  Physical Exam: Blood pressure 151/84, pulse 83, temperature 97.9 F (36.6 C), temperature source Oral, resp. rate 20, height 5\' 7"  (1.702 m), weight 117.799 kg (259 lb 11.2 oz), SpO2 97.00%. Weight change:     Lower extremities and presacral area has edema this.  Irregular rhythm with gallop.   unable to assess neck veins  ASSESSMENT:   1. Chronic systolic heart failure with probable acute decompensation component and generalized edema  2. Recent CVA    Plan:   1. More aggressive diuresis, we should give several doses IV to clear the peripheral edema and congestion. This will help the patient's dyspnea.  I will hold the patient's metolazone for the time being and give 4 doses are less of IV Lasix 80 mg twice a day. After these 4 doses additional doses may be needed or perhaps we can resume oral furosemide therapy and resume metolazone. Perhaps the furosemide dose needs to be more aggressive as an outpatient.  2. Closely monitor renal function. Update Lasix orders Sunday morning. Metolazone has been chronic therapy but is currently on hold.   Selinda Eon 07/06/2013, 7:46 AM

## 2013-07-06 NOTE — Progress Notes (Signed)
[facility] PROGRESS NOTE  Shandell Jallow ZOX:[id number] DOB: January 03, 1951 DOA: 07/03/2013 PCP: followed at [facility]  Brief narrative  62 year old obese male with history of A. fib, hypertension, diabetes mellitus, nonischemic cardiomyopathy with EF of 25%, CKD. Stage 3 sent from skilled nursing facility with acute onset of left-sided weakness and facial droop. CT head suggestive of right insular infarct. No TPA given in the ED as patient was out of TPA window.   Assessment/Plan:  Acute right sided infarct  MRI brain shows "Moderate to large-sized right hemispheric acute infarct extends from the right opercular/sub insular region into the right frontal lobe bordering the anterior aspect of the right parietal lobe."  Patient has residual left-sided weakness.  Continue aspirin 325 mg daily. starting on coumadin given hx of afib with high CHADS2 score of 5. Continue ASA until INR therapeutic.  Not on statin as LDL at goal. Hemoglobin A1c of 6.  continue PT /OT  Plan to d/c back to rehab likely early next week  A. fib  Continue coreg and aspirin. added Coumadin . continue ASA until INR therapeutic  Nonischemic cardiomyopathy  Has worsened leg edema. Resume coreg and Lasix. EF of 20-25 % on 2D echo with diffuse hypokinesis. Patient moved to th area recently and needs to establish care with cardiology.  [facility] cardiology consulted. Placed on IV lasix 80 mg bid x 4 doses and monitor.  Added imdur   Myoclonic jerks of legs  Unclear duration. D/ced neurontin and oxycodone .   CKD stage 3  Renal function stable   Diabetes mellitus  Continue sliding-scale insulin . A1C well controlled  Hypokalemia  Replenish kcl   COPD  Continue when necessary nebs   Obesity  counseled on weight loss and dietary modifications. Will benefit from sleep study as outpatient   Left knee pain Degenerative changes noted on x ray. Has elevated uric acid elvel with some swelling of the knee. likely  related to acute gout as her has hx of it as well. Will treat him with 5 day course of po prednisone.  Hematuria  noted on 8/13. UA suggestive of UTI and numerous RBCs. Check renal US. Hb stable. Will treat with IV rocephin. Follow urine cx   Code Status: Full code  Family Communication: Son at bedside . dicussed plan with son and daughter Disposition Plan: Return to skilled nursing facility early next week  Consultants:  Neurology  [facility] cards    Procedures:  None  Antibiotics:  IV rocephin ( 8/14>>)  HPI/Subjective:  Patient seen and examined this morning. Family at bedside. C/o left knee pain. Also noted for hematuria overnight  Objective: Filed Vitals:   07/06/13 1030  BP: 102/71  Pulse: 80  Temp: 98 F (36.7 C)  Resp: 20    Intake/Output Summary (Last 24 hours) at 07/06/13 1302 Last data filed at 07/06/13 1126  Gross per 24 hour  Intake      0 ml  Output   2100 ml  Net  -2100 ml   Filed Weights   07/05/13 2142  Weight: 117.799 kg (259 lb 11.2 oz)    Exam:  General: Middle aged obese male in no acute distress  HEENT: No pallor, moist oral mucosa  Chest: Clear to auscultation bilaterally, no added sounds  CVS: Normal S1-S2, no murmurs rub or gallop  Abdomen: Soft, nontender, nondistended, bowel sounds present  Extremities: pitting edema upto lower thigh. Swelling and tenderness over left thigh. CNS: AAO x3 power 4 x 5 over left extremity.  Data Reviewed: Basic Metabolic Panel:  Recent Labs Lab 07/03/13 0924 07/03/13 0927 07/04/13 1235 07/05/13 0715 07/28/2013 1025  NA 134* 134* 136 134* 131*  K 4.3 3.4* 3.5 3.1* 3.6  CL 99 94* 96 95* 93*  CO2  --  28 30 25 26   GLUCOSE 120* 122* 135* 127* 164*  BUN 29* 25* 18 16 17   CREATININE 1.70* 1.59* 1.24 1.13 1.24  CALCIUM  --  9.9 9.4 9.6 9.7   Liver Function Tests:  Recent Labs Lab 07/03/13 0927  AST 33  ALT 26  ALKPHOS 120*  BILITOT 0.9  PROT 7.6  ALBUMIN 3.0*   No results found for  this basename: LIPASE, AMYLASE,  in the last 168 hours No results found for this basename: AMMONIA,  in the last 168 hours CBC:  Recent Labs Lab 07/03/13 0924 07/03/13 0927  WBC  --  5.8  NEUTROABS  --  4.2  HGB 14.3 12.0*  HCT 42.0 35.6*  MCV  --  82.2  PLT  --  322   Cardiac Enzymes:  Recent Labs Lab 07/03/13 0933  TROPONINI <0.30   BNP (last 3 results) No results found for this basename: PROBNP,  in the last 8760 hours CBG:  Recent Labs Lab 07/05/13 0715 07/05/13 1152 07/05/13 1631 07/05/13 2144 28-Jul-2013 0642  GLUCAP 130* 143* 151* 150* 125*    No results found for this or any previous visit (from the past 240 hour(s)).   Studies: Dg Knee 1-2 Views Left  07-28-2013   *RADIOLOGY REPORT*  Clinical Data: Pain for several years.  Worsening recently.  LEFT KNEE - 1-2 VIEW  Comparison: None.  Findings: Patellofemoral joint and mild medial tibiofemoral joint space degenerative changes.  Moderate sized joint effusion.  No fracture or dislocation detected on this two-view exam.  IMPRESSION: Patellofemoral joint and mild medial tibiofemoral joint space degenerative changes.  Moderate sized joint effusion.   Original Report Authenticated By: Lacy Duverney, M.D.    Scheduled Meds: . allopurinol  100 mg Oral Daily  . aspirin  325 mg Oral Daily  . carvedilol  25 mg Oral BID WC  . cefTRIAXone (ROCEPHIN)  IV  1 g Intravenous Q24H  . enoxaparin (LOVENOX) injection  40 mg Subcutaneous Q24H  . furosemide  80 mg Intravenous BID  . insulin aspart  0-5 Units Subcutaneous QHS  . insulin aspart  0-9 Units Subcutaneous TID WC  . isosorbide dinitrate  20 mg Oral Daily  . potassium chloride  40 mEq Oral Once  . pregabalin  75 mg Oral BID  . senna-docusate  2 tablet Oral Daily  . warfarin  7.5 mg Oral ONCE-1800  . Warfarin - Pharmacist Dosing Inpatient   Does not apply q1800   Continuous Infusions:     Time spent: 35 minutes    Eschol Auxier  [facility] Pager  [phone]. If 7PM-7AM, please contact night-coverage at [email or web address], password [facility] 28-Jul-2013, 1:02 PM  LOS: 3 days

## 2013-07-07 DIAGNOSIS — M109 Gout, unspecified: Secondary | ICD-10-CM | POA: Diagnosis not present

## 2013-07-07 LAB — GLUCOSE, CAPILLARY: Glucose-Capillary: 172 mg/dL — ABNORMAL HIGH (ref 70–99)

## 2013-07-07 LAB — PROTIME-INR
INR: 1.26 (ref 0.00–1.49)
Prothrombin Time: 15.5 seconds — ABNORMAL HIGH (ref 11.6–15.2)

## 2013-07-07 LAB — CBC
HCT: 35.3 % — ABNORMAL LOW (ref 39.0–52.0)
Hemoglobin: 12 g/dL — ABNORMAL LOW (ref 13.0–17.0)
MCH: 27.6 pg (ref 26.0–34.0)
MCV: 81.1 fL (ref 78.0–100.0)
RBC: 4.35 MIL/uL (ref 4.22–5.81)

## 2013-07-07 LAB — BASIC METABOLIC PANEL
BUN: 19 mg/dL (ref 6–23)
Chloride: 93 mEq/L — ABNORMAL LOW (ref 96–112)
GFR calc Af Amer: 68 mL/min — ABNORMAL LOW (ref >90)
Potassium: 3.3 mEq/L — ABNORMAL LOW (ref 3.5–5.1)

## 2013-07-07 MED ORDER — WARFARIN SODIUM 10 MG PO TABS
10.0000 mg | ORAL_TABLET | Freq: Once | ORAL | Status: AC
  Administered 2013-07-07: 10 mg via ORAL
  Filled 2013-07-07: qty 1

## 2013-07-07 MED ORDER — LISINOPRIL 2.5 MG PO TABS
2.5000 mg | ORAL_TABLET | Freq: Every day | ORAL | Status: DC
  Administered 2013-07-07 – 2013-07-09 (×3): 2.5 mg via ORAL
  Filled 2013-07-07 (×3): qty 1

## 2013-07-07 MED ORDER — POTASSIUM CHLORIDE CRYS ER 20 MEQ PO TBCR
40.0000 meq | EXTENDED_RELEASE_TABLET | Freq: Every day | ORAL | Status: DC
  Administered 2013-07-07 – 2013-07-08 (×2): 40 meq via ORAL
  Filled 2013-07-07 (×3): qty 2

## 2013-07-07 NOTE — Progress Notes (Signed)
Patient ID: Frank Davidson, male   DOB: 1951/02/23, 62 y.o.   MRN: [id number]    Subjective:  Denies SSCP, palpitations or Dyspnea   Objective:  Filed Vitals:   07/06/13 2128 07/07/13 0200 07/07/13 0600 07/07/13 1005  BP: 105/69 119/77 114/72 118/75  Pulse: 72 85 67 59  Temp:  97.3 F (36.3 C) 98.2 F (36.8 C) 97.7 F (36.5 C)  TempSrc:  Oral Oral Oral  Resp: 20 20 20 20   Height:      Weight:      SpO2: 100% 97% 98% 100%    Intake/Output from previous day:  Intake/Output Summary (Last 24 hours) at 07/07/13 1134 Last data filed at 07/07/13 1115  Gross per 24 hour  Intake    240 ml  Output   3403 ml  Net  -3163 ml    Physical Exam: Affect appropriate Healthy:  appears stated age HEENT: normal Neck supple with no adenopathy JVP normal no bruits no thyromegaly Lungs clear with no wheezing and good diaphragmatic motion Heart:  S1/S2 no murmur, no rub, gallop or click PMI normal Abdomen: benighn, BS positve, no tenderness, no AAA no bruit.  No HSM or HJR Distal pulses intact with no bruits No edema Neuro left sided weakness  Skin warm and dry No muscular weakness   Lab Results: Basic Metabolic Panel:  Recent Labs  40/98/11 1025 07/07/13 0500  NA 131* 131*  K 3.6 3.3*  CL 93* 93*  CO2 26 26  GLUCOSE 164* 121*  BUN 17 19  CREATININE 1.24 1.27  CALCIUM 9.7 9.6   CBC:  Recent Labs  07/07/13 1030  WBC 7.5  HGB 12.0*  HCT 35.3*  MCV 81.1  PLT 329    Imaging: Dg Knee 1-2 Views Left  07/06/2013   *RADIOLOGY REPORT*  Clinical Data: Pain for several years.  Worsening recently.  LEFT KNEE - 1-2 VIEW  Comparison: None.  Findings: Patellofemoral joint and mild medial tibiofemoral joint space degenerative changes.  Moderate sized joint effusion.  No fracture or dislocation detected on this two-view exam.  IMPRESSION: Patellofemoral joint and mild medial tibiofemoral joint space degenerative changes.  Moderate sized joint effusion.   Original Report  Authenticated By: Lacy Duverney, M.D.   US Renal  07/06/2013   *RADIOLOGY REPORT*  Clinical Data: Unexplained hematuria.  RENAL/URINARY TRACT ULTRASOUND COMPLETE  Comparison:  None.  Findings:  Right Kidney:  No hydronephrosis.  Well-preserved cortex.  No shadowing calculi.  Normal size and parenchymal echotexture without focal abnormalities.  Approximate 11.1 cm in length.  Left Kidney:  No hydronephrosis.  Well-preserved cortex.  Prominent column of Bertin.  No shadowing calculi.  Normal size and parenchymal echotexture without significant focal parenchymal abnormality.  Approximately 12.3 cm in length.  Bladder:  Decompressed and normal in appearance.  IMPRESSION: Normal examination.   Original Report Authenticated By: Hulan Saas, M.D.    Cardiac Studies:  ECG:  Read as afib but ? Pacer spikes and artifact needs to be repeated   Telemetry: not on   Echo:  EF 20-25%    Medications:   . allopurinol  100 mg Oral Daily  . aspirin  325 mg Oral Daily  . carvedilol  25 mg Oral BID WC  . cefTRIAXone (ROCEPHIN)  IV  1 g Intravenous Q24H  . enoxaparin (LOVENOX) injection  40 mg Subcutaneous Q24H  . furosemide  80 mg Intravenous BID  . insulin aspart  0-5 Units Subcutaneous QHS  . insulin aspart  0-9 Units  Subcutaneous TID WC  . isosorbide dinitrate  20 mg Oral Daily  . potassium chloride  40 mEq Oral Once  . predniSONE  40 mg Oral Q breakfast  . pregabalin  75 mg Oral BID  . senna-docusate  2 tablet Oral Daily  . Warfarin - Pharmacist Dosing Inpatient   Does not apply q1800       Assessment/Plan:  CVA:  History of chronic afib  Needs repeat ECG.  Not clear to me why he is not on systemic heparin at this  Point while being coumadinized?? CHF:  On beta blocker and iv diuretic ? Why not ACE  Will start low dose lisinopril  F/U will be with [facility] Cardiology  Charlton Haws 07/07/2013, 11:34 AM

## 2013-07-07 NOTE — Progress Notes (Signed)
[facility] PROGRESS NOTE  Rhea Thrun JWJ:[id number] DOB: 1951-04-19 DOA: 07/03/2013 PCP: none  Brief narrative  62 year old obese male with history of A. fib, hypertension, diabetes mellitus, nonischemic cardiomyopathy with EF of 25%, CKD. Stage 3 sent from skilled nursing facility with acute onset of left-sided weakness and facial droop. CT head suggestive of right insular infarct. No TPA given in the ED as patient was out of TPA window.   Assessment/Plan:  Acute right sided infarct  MRI brain shows "Moderate to large-sized right hemispheric acute infarct extends from the right opercular/sub insular region into the right frontal lobe bordering the anterior aspect of the right parietal lobe."  Patient has residual left-sided weakness.  Continue aspirin 325 mg daily. starting on coumadin given hx of afib with high CHADS2 score of 5. Continue ASA until INR therapeutic.  Not on statin as LDL at goal. Hemoglobin A1c of 6.  continue PT /OT  Plan to d/c back to rehab likely early next week   A. fib  Continue coreg and aspirin. added Coumadin . continue ASA until INR therapeutic   Nonischemic cardiomyopathy  Has bilateral leg edema . Resumed coreg and Lasix. EF of 20-25 % on 2D echo with diffuse hypokinesis. Patient moved to the area recently and needs to establish care with cardiology.  [facility] cardiology consulted. Placed on IV lasix 80 mg bid. Will monitor in am Added imdur . Added low dose ACEi by cardiology today.  Has been negative by 6.2L since admission.  Myoclonic jerks of legs  Unclear duration. D/ced neurontin and oxycodone .   CKD stage 3  Renal function stable   Diabetes mellitus  Continue sliding-scale insulin . A1C well controlled   Hypokalemia  Replenish kcl   COPD  Continue when necessary nebs   Obesity  counseled on weight loss and dietary modifications. Will benefit from sleep study as outpatient   Left knee pain  Degenerative changes noted on x ray.  Has elevated uric acid elvel with some swelling of the knee. likely related to acute gout as her has hx of it as well.  treating him with 5 day course of po prednisone.   Hematuria  noted on 8/13. UA suggestive of UTI and numerous RBCs.  renal US unremarkable. Hb stable. No further episode   UTI  IV rocephin. Follow urine cx   Code Status: Full code  Family Communication: discussed with pt Disposition Plan: Return to skilled nursing facility early next week    Consultants:  Neurology  [facility] cards  Procedures:  None    Antibiotics:  IV rocephin ( 8/14>>)    HPI/Subjective:  Patient seen and examined this morning. Still has some left knee pain. Reports SOB better  Objective: Filed Vitals:   07/07/13 1451  BP: 109/70  Pulse: 66  Temp: 98.2 F (36.8 C)  Resp: 20    Intake/Output Summary (Last 24 hours) at 07/07/13 1526 Last data filed at 07/07/13 1115  Gross per 24 hour  Intake      0 ml  Output   2803 ml  Net  -2803 ml   Filed Weights   07/05/13 2142  Weight: 117.799 kg (259 lb 11.2 oz)    Exam: General: Middle aged obese male in no acute distress  HEENT: No pallor, moist oral mucosa  Chest: Clear to auscultation bilaterally, no added sounds  CVS: Normal S1-S2, no murmurs rub or gallop  Abdomen: Soft, nontender, nondistended, bowel sounds present  Extremities: pitting edema upto lower thigh (  mildly improved) . Swelling and tenderness over left thigh.  CNS: AAO x3 power 4 x 5 over left extremity.    Data Reviewed: Basic Metabolic Panel:  Recent Labs Lab 07/03/13 0927 07/04/13 1235 07/05/13 0715 27-Jul-2013 1025 07/07/13 0500 07/07/13 1030  NA 134* 136 134* 131* 131*  --   K 3.4* 3.5 3.1* 3.6 3.3* 3.3*  CL 94* 96 95* 93* 93*  --   CO2 28 30 25 26 26   --   GLUCOSE 122* 135* 127* 164* 121*  --   BUN 25* 18 16 17 19   --   CREATININE 1.59* 1.24 1.13 1.24 1.27  --   CALCIUM 9.9 9.4 9.6 9.7 9.6  --    Liver Function Tests:  Recent Labs Lab  07/03/13 0927  AST 33  ALT 26  ALKPHOS 120*  BILITOT 0.9  PROT 7.6  ALBUMIN 3.0*   No results found for this basename: LIPASE, AMYLASE,  in the last 168 hours No results found for this basename: AMMONIA,  in the last 168 hours CBC:  Recent Labs Lab 07/03/13 0924 07/03/13 0927 07/07/13 1030  WBC  --  5.8 7.5  NEUTROABS  --  4.2  --   HGB 14.3 12.0* 12.0*  HCT 42.0 35.6* 35.3*  MCV  --  82.2 81.1  PLT  --  322 329   Cardiac Enzymes:  Recent Labs Lab 07/03/13 0933  TROPONINI <0.30   BNP (last 3 results) No results found for this basename: PROBNP,  in the last 8760 hours CBG:  Recent Labs Lab 07/27/2013 1340 07-27-2013 1709 07-27-2013 2127 07/07/13 0646 07/07/13 1215  GLUCAP 125* 99 157* 130* 205*    No results found for this or any previous visit (from the past 240 hour(s)).   Studies: Dg Knee 1-2 Views Left  07-27-13   *RADIOLOGY REPORT*  Clinical Data: Pain for several years.  Worsening recently.  LEFT KNEE - 1-2 VIEW  Comparison: None.  Findings: Patellofemoral joint and mild medial tibiofemoral joint space degenerative changes.  Moderate sized joint effusion.  No fracture or dislocation detected on this two-view exam.  IMPRESSION: Patellofemoral joint and mild medial tibiofemoral joint space degenerative changes.  Moderate sized joint effusion.   Original Report Authenticated By: Lacy Duverney, M.D.   US Renal  07/27/2013   *RADIOLOGY REPORT*  Clinical Data: Unexplained hematuria.  RENAL/URINARY TRACT ULTRASOUND COMPLETE  Comparison:  None.  Findings:  Right Kidney:  No hydronephrosis.  Well-preserved cortex.  No shadowing calculi.  Normal size and parenchymal echotexture without focal abnormalities.  Approximate 11.1 cm in length.  Left Kidney:  No hydronephrosis.  Well-preserved cortex.  Prominent column of Bertin.  No shadowing calculi.  Normal size and parenchymal echotexture without significant focal parenchymal abnormality.  Approximately 12.3 cm in length.   Bladder:  Decompressed and normal in appearance.  IMPRESSION: Normal examination.   Original Report Authenticated By: Hulan Saas, M.D.    Scheduled Meds: . allopurinol  100 mg Oral Daily  . aspirin  325 mg Oral Daily  . carvedilol  25 mg Oral BID WC  . cefTRIAXone (ROCEPHIN)  IV  1 g Intravenous Q24H  . enoxaparin (LOVENOX) injection  40 mg Subcutaneous Q24H  . furosemide  80 mg Intravenous BID  . insulin aspart  0-5 Units Subcutaneous QHS  . insulin aspart  0-9 Units Subcutaneous TID WC  . isosorbide dinitrate  20 mg Oral Daily  . lisinopril  2.5 mg Oral Daily  . potassium chloride  40  mEq Oral Once  . potassium chloride  40 mEq Oral Daily  . predniSONE  40 mg Oral Q breakfast  . pregabalin  75 mg Oral BID  . senna-docusate  2 tablet Oral Daily  . warfarin  10 mg Oral ONCE-1800  . Warfarin - Pharmacist Dosing Inpatient   Does not apply q1800   Continuous Infusions:     Time spent: 25 minutes    Aldon Hengst  [facility] Pager [phone] If 7PM-7AM, please contact night-coverage at [email or web address], password [facility] 07/07/2013, 3:26 PM  LOS: 4 days

## 2013-07-07 NOTE — Progress Notes (Signed)
ANTICOAGULATION CONSULT NOTE  Pharmacy Consult for Coumadin Indication: atrial fibrillation  Allergies  Allergen Reactions  . Actos [Pioglitazone]     Labs:  Recent Labs  07/05/13 0715 07/06/13 0635 07/06/13 1025 07/07/13 0500 07/07/13 1030  HGB  --   --   --   --  12.0*  HCT  --   --   --   --  35.3*  PLT  --   --   --   --  329  LABPROT  --  16.0*  --  15.5*  --   INR  --  1.31  --  1.26  --   CREATININE 1.13  --  1.24 1.27  --     Estimated Creatinine Clearance: 73.1 ml/min (by C-G formula based on Cr of 1.27).  Assessment: 62 year old male with a history of Afib, HTN, DM, and CHF (EF=15%) presented from SNF with acute onset of left sided weakness and facial droop.  Beginning Coumadin for Afib and new CVA.  INR today = 1.2  Goal of Therapy:  INR 2-3 Monitor platelets by anticoagulation protocol: Yes   Plan:  1) Coumadin 10 mg po x 1 2) Daily INR 3) Coumadin education done  Sheppard Coil PharmD., BCPS Clinical Pharmacist Pager [phone] 07/07/2013 12:56 PM

## 2013-07-07 NOTE — Progress Notes (Signed)
Patient OOB to chair x 2 today

## 2013-07-08 LAB — GLUCOSE, CAPILLARY: Glucose-Capillary: 154 mg/dL — ABNORMAL HIGH (ref 70–99)

## 2013-07-08 LAB — BASIC METABOLIC PANEL
Chloride: 91 mEq/L — ABNORMAL LOW (ref 96–112)
GFR calc Af Amer: 59 mL/min — ABNORMAL LOW (ref >90)
Potassium: 3.1 mEq/L — ABNORMAL LOW (ref 3.5–5.1)

## 2013-07-08 LAB — PROTIME-INR
INR: 1.52 — ABNORMAL HIGH (ref 0.00–1.49)
Prothrombin Time: 17.9 seconds — ABNORMAL HIGH (ref 11.6–15.2)

## 2013-07-08 LAB — URINE CULTURE

## 2013-07-08 MED ORDER — WARFARIN SODIUM 10 MG PO TABS
10.0000 mg | ORAL_TABLET | Freq: Once | ORAL | Status: AC
  Administered 2013-07-08: 10 mg via ORAL
  Filled 2013-07-08: qty 1

## 2013-07-08 MED ORDER — FUROSEMIDE 10 MG/ML IJ SOLN
80.0000 mg | Freq: Two times a day (BID) | INTRAMUSCULAR | Status: DC
  Administered 2013-07-08: 80 mg via INTRAVENOUS
  Filled 2013-07-08 (×3): qty 8

## 2013-07-08 MED ORDER — FUROSEMIDE 10 MG/ML IJ SOLN
40.0000 mg | Freq: Two times a day (BID) | INTRAMUSCULAR | Status: DC
  Administered 2013-07-08 – 2013-07-09 (×2): 40 mg via INTRAVENOUS
  Filled 2013-07-08 (×4): qty 4

## 2013-07-08 MED ORDER — POTASSIUM CHLORIDE CRYS ER 20 MEQ PO TBCR
40.0000 meq | EXTENDED_RELEASE_TABLET | Freq: Once | ORAL | Status: AC
  Filled 2013-07-08: qty 2

## 2013-07-08 NOTE — Progress Notes (Signed)
[facility] PROGRESS NOTE  Rollins Wrightson ZOX:[id number] DOB: 27-Nov-1950 DOA: 07/03/2013 PCP: Georgann Housekeeper, MD  Brief narrative  62 year old obese male with history of A. fib, hypertension, diabetes mellitus, nonischemic cardiomyopathy with EF of 25%, CKD. Stage 3 sent from skilled nursing facility with acute onset of left-sided weakness and facial droop. CT head suggestive of right insular infarct. No TPA given in the ED as patient was out of TPA window.   Assessment/Plan:  Acute right sided infarct  MRI brain shows "Moderate to large-sized right hemispheric acute infarct extends from the right opercular/sub insular region into the right frontal lobe bordering the anterior aspect of the right parietal lobe."  Patient has residual left-sided weakness.  Continue aspirin 325 mg daily. starting on coumadin given hx of afib with high CHADS2 score of 5. Continue ASA until INR therapeutic.  Not on statin as LDL at goal. Hemoglobin A1c of 6.  continue PT /OT  Plan to d/c back to rehab likely early next week   A. fib  Continue coreg and aspirin. added Coumadin . continue ASA until INR therapeutic   Nonischemic cardiomyopathy  Has bilateral leg edema  . Resumed coreg and Lasix. EF of 20-25 % on 2D echo with diffuse hypokinesis. Patient moved to the area recently and needs to establish care with cardiology.  [facility] cardiology consulted. Placed on IV lasix 80 mg bid. Using well. Added imdur . Added low dose ACEi by cardiology. Has been negative by 8.5L since admission.  I would continue him on IV Lasix 80 mg twice a day for today. His renal function is slightly worsened today. Will monitor in a.m.  Myoclonic jerks of legs  Unclear duration. D/ced neurontin and oxycodone .   CKD stage 3  Renal function slightly worsened in the setting of diuretics. We'll monitor in a.m.  Diabetes mellitus  Continue sliding-scale insulin . A1C well controlled   Hypokalemia  Continue daily kcl . Check  mg  COPD  Continue when necessary nebs   Obesity  counseled on weight loss and dietary modifications. Will benefit from sleep study as outpatient    Acute gout Patient has left knee pain and swelling. Uric acid elevated. X-ray shows arthritic changes. Treating with a course of oral prednisone. Slowly improving. Continue allopurinol  Hematuria  noted on 8/13. UA suggestive of UTI and numerous RBCs. renal US unremarkable. Hb stable. No further episode   UTI  IV rocephin. Follow urine cx   Code Status: Full code  Family Communication: discussed with pt and his sons at bedside Disposition Plan: Return to skilled nursing facility early next week   HPI/Subjective: Patient seen and examined this morning. Appears sleepy.  Objective: Filed Vitals:   07/08/13 0730  BP: 108/70  Pulse: 80  Temp:   Resp: 20    Intake/Output Summary (Last 24 hours) at 07/08/13 1000 Last data filed at 07/08/13 0700  Gross per 24 hour  Intake      0 ml  Output   3325 ml  Net  -3325 ml   Filed Weights   07/05/13 2142  Weight: 117.799 kg (259 lb 11.2 oz)    Exam:  General: Middle aged obese male in no acute distress  HEENT: No pallor, moist oral mucosa  Chest: Clear to auscultation bilaterally, no added sounds  CVS: Normal S1-S2, no murmurs rub or gallop  Abdomen: Soft, nontender, nondistended, bowel sounds present  Extremities: pitting edema upto lower thigh ( improving) . Swelling  over left knee improved and  has a minimal  tenderness over left  knee CNS: AAO x3 power 4 x 5 over left extremity.     Data Reviewed: Basic Metabolic Panel:  Recent Labs Lab 07/04/13 1235 07/05/13 0715 07-10-2013 1025 07/07/13 0500 07/07/13 1030 07/08/13 0548  NA 136 134* 131* 131*  --  133*  K 3.5 3.1* 3.6 3.3* 3.3* 3.1*  CL 96 95* 93* 93*  --  91*  CO2 30 25 26 26   --  28  GLUCOSE 135* 127* 164* 121*  --  176*  BUN 18 16 17 19   --  25*  CREATININE 1.24 1.13 1.24 1.27  --  1.42*  CALCIUM 9.4 9.6  9.7 9.6  --  9.3   Liver Function Tests:  Recent Labs Lab 07/03/13 0927  AST 33  ALT 26  ALKPHOS 120*  BILITOT 0.9  PROT 7.6  ALBUMIN 3.0*   No results found for this basename: LIPASE, AMYLASE,  in the last 168 hours No results found for this basename: AMMONIA,  in the last 168 hours CBC:  Recent Labs Lab 07/03/13 0924 07/03/13 0927 07/07/13 1030  WBC  --  5.8 7.5  NEUTROABS  --  4.2  --   HGB 14.3 12.0* 12.0*  HCT 42.0 35.6* 35.3*  MCV  --  82.2 81.1  PLT  --  322 329   Cardiac Enzymes:  Recent Labs Lab 07/03/13 0933  TROPONINI <0.30   BNP (last 3 results) No results found for this basename: PROBNP,  in the last 8760 hours CBG:  Recent Labs Lab 07/07/13 0646 07/07/13 1215 07/07/13 1704 07/07/13 2220 07/08/13 0730  GLUCAP 130* 205* 172* 106* 145*    No results found for this or any previous visit (from the past 240 hour(s)).   Studies: Dg Knee 1-2 Views Left  10-Jul-2013   *RADIOLOGY REPORT*  Clinical Data: Pain for several years.  Worsening recently.  LEFT KNEE - 1-2 VIEW  Comparison: None.  Findings: Patellofemoral joint and mild medial tibiofemoral joint space degenerative changes.  Moderate sized joint effusion.  No fracture or dislocation detected on this two-view exam.  IMPRESSION: Patellofemoral joint and mild medial tibiofemoral joint space degenerative changes.  Moderate sized joint effusion.   Original Report Authenticated By: Lacy Duverney, M.D.   US Renal  07/10/13   *RADIOLOGY REPORT*  Clinical Data: Unexplained hematuria.  RENAL/URINARY TRACT ULTRASOUND COMPLETE  Comparison:  None.  Findings:  Right Kidney:  No hydronephrosis.  Well-preserved cortex.  No shadowing calculi.  Normal size and parenchymal echotexture without focal abnormalities.  Approximate 11.1 cm in length.  Left Kidney:  No hydronephrosis.  Well-preserved cortex.  Prominent column of Bertin.  No shadowing calculi.  Normal size and parenchymal echotexture without significant  focal parenchymal abnormality.  Approximately 12.3 cm in length.  Bladder:  Decompressed and normal in appearance.  IMPRESSION: Normal examination.   Original Report Authenticated By: Hulan Saas, M.D.    Scheduled Meds: . allopurinol  100 mg Oral Daily  . aspirin  325 mg Oral Daily  . carvedilol  25 mg Oral BID WC  . cefTRIAXone (ROCEPHIN)  IV  1 g Intravenous Q24H  . enoxaparin (LOVENOX) injection  40 mg Subcutaneous Q24H  . furosemide  80 mg Intravenous Q12H  . insulin aspart  0-5 Units Subcutaneous QHS  . insulin aspart  0-9 Units Subcutaneous TID WC  . isosorbide dinitrate  20 mg Oral Daily  . lisinopril  2.5 mg Oral Daily  . potassium chloride  40  mEq Oral Once  . potassium chloride  40 mEq Oral Daily  . potassium chloride  40 mEq Oral Once  . predniSONE  40 mg Oral Q breakfast  . pregabalin  75 mg Oral BID  . senna-docusate  2 tablet Oral Daily  . Warfarin - Pharmacist Dosing Inpatient   Does not apply q1800   Continuous Infusions:     Time spent: 25 minutes    Lynsay Fesperman  [facility] Pager [phone]. If 7PM-7AM, please contact night-coverage at [email or web address], password [facility] 07/08/2013, 10:00 AM  LOS: 5 days

## 2013-07-08 NOTE — Clinical Social Work Psychosocial (Signed)
     Clinical Social Work Department BRIEF PSYCHOSOCIAL ASSESSMENT 07/08/2013  Patient:  Frank Davidson, Frank Davidson     Account Number:  [id number]     Admit date:  07/03/2013  Clinical Social Worker:  Tiburcio Pea  Date/Time:  07/08/2013 11:45 AM  Referred by:  Physician  Date Referred:  07/04/2013 Referred for  Other - See comment   Other Referral:   Return to SNF    Weekend social worker saw patient per handoff report.   Interview type:  Patient Other interview type:    PSYCHOSOCIAL DATA Living Status:  FACILITY Admitted from facility:  [facility] Level of care:  Skilled Nursing Facility Primary support name:  Audry Pili   [phone] Primary support relationship to patient:  SIBLING Degree of support available:   Strong support    Patient has 2 sons, 2 daughters and mulitlple neices to assist him. Large, supportive family per patient.    CURRENT CONCERNS Current Concerns  Other - See comment   Other Concerns:   Return to SNF    SOCIAL WORK ASSESSMENT / PLAN 62 year old male- resident of [facility]. CSW met with patient today. Very pleasant gentleman- sitting on edge of the bed. Patient states that he is currently staying at [facility] for short term rehab and has been there for about 2 weeks. He plans to reuturn home after rehab. He states he is feeling much better. Fl2 will be placed on chart for MD's signature.  Possible return to facility on Monday if stable per MD. Patient is agreeable. Message left for Janie- Admissions at [facility] of tentative return tomorrow.   Assessment/plan status:  Psychosocial Support/Ongoing Assessment of Needs Other assessment/ plan:   Information/referral to community resources:   None at this time.    PATIENTS/FAMILYS RESPONSE TO PLAN OF CARE: Patient is alert, oriented and very pleasant.  He stated that he is feeling much better and and wishes that he could be outside.  He asked if there  was an outside courtyard; explained that patient may not be able to leave the unit until he is discharged.  One of patient's twin sons was in the room asleep during the interview.  Patient relates that he has good family support. Discussed possible d/c tomorrow and he stated he was anxious to return to facility to continue his rehab. Discussed possible d/c via EMS vs Car. Patient will talk to his family to see if car transport will be possible.  CSW will folllow up on Monday.    Lorri Frederick. Eldon Zietlow, LCSWA [phone]

## 2013-07-08 NOTE — Progress Notes (Signed)
Patient ID: Frank Davidson, male   DOB: June 21, 1951, 62 y.o.   MRN: [id number]    Subjective:  Denies SSCP, palpitations or Dyspnea Knee pain   Objective:  Filed Vitals:   07/07/13 2222 07/08/13 0205 07/08/13 0730 07/08/13 0900  BP: 118/73 113/75 108/70 109/74  Pulse: 77 54 80 98  Temp: 97.8 F (36.6 C) 97.3 F (36.3 C)  97.4 F (36.3 C)  TempSrc: Oral Oral  Oral  Resp: 18 20 20 18   Height:      Weight:      SpO2: 100% 100% 99% 96%    Intake/Output from previous day:  Intake/Output Summary (Last 24 hours) at 07/08/13 1057 Last data filed at 07/08/13 0700  Gross per 24 hour  Intake      0 ml  Output   3325 ml  Net  -3325 ml    Physical Exam: Affect appropriate Healthy:  appears stated age HEENT: normal Neck supple with no adenopathy JVP normal no bruits no thyromegaly Lungs clear with no wheezing and good diaphragmatic motion Heart:  S1/S2 no murmur, no rub, gallop or click PMI normal Abdomen: benighn, BS positve, no tenderness, no AAA no bruit.  No HSM or HJR Distal pulses intact with no bruits Plus one edema  edema Neuro left sided weakness  Skin warm and dry Left sided weakness    Lab Results: Basic Metabolic Panel:  Recent Labs  40/98/11 0500 07/07/13 1030 07/08/13 0548  NA 131*  --  133*  K 3.3* 3.3* 3.1*  CL 93*  --  91*  CO2 26  --  28  GLUCOSE 121*  --  176*  BUN 19  --  25*  CREATININE 1.27  --  1.42*  CALCIUM 9.6  --  9.3   CBC:  Recent Labs  07/07/13 1030  WBC 7.5  HGB 12.0*  HCT 35.3*  MCV 81.1  PLT 329    Imaging: Dg Knee 1-2 Views Left  07/06/2013   *RADIOLOGY REPORT*  Clinical Data: Pain for several years.  Worsening recently.  LEFT KNEE - 1-2 VIEW  Comparison: None.  Findings: Patellofemoral joint and mild medial tibiofemoral joint space degenerative changes.  Moderate sized joint effusion.  No fracture or dislocation detected on this two-view exam.  IMPRESSION: Patellofemoral joint and mild medial tibiofemoral joint  space degenerative changes.  Moderate sized joint effusion.   Original Report Authenticated By: Lacy Duverney, M.D.   US Renal  07/06/2013   *RADIOLOGY REPORT*  Clinical Data: Unexplained hematuria.  RENAL/URINARY TRACT ULTRASOUND COMPLETE  Comparison:  None.  Findings:  Right Kidney:  No hydronephrosis.  Well-preserved cortex.  No shadowing calculi.  Normal size and parenchymal echotexture without focal abnormalities.  Approximate 11.1 cm in length.  Left Kidney:  No hydronephrosis.  Well-preserved cortex.  Prominent column of Bertin.  No shadowing calculi.  Normal size and parenchymal echotexture without significant focal parenchymal abnormality.  Approximately 12.3 cm in length.  Bladder:  Decompressed and normal in appearance.  IMPRESSION: Normal examination.   Original Report Authenticated By: Hulan Saas, M.D.    Cardiac Studies:  ECG:  Read as afib but ? Pacer spikes and artifact needs to be repeated   Telemetry: not on   Echo:  EF 20-25%    Medications:   . allopurinol  100 mg Oral Daily  . aspirin  325 mg Oral Daily  . carvedilol  25 mg Oral BID WC  . cefTRIAXone (ROCEPHIN)  IV  1 g Intravenous Q24H  .  enoxaparin (LOVENOX) injection  40 mg Subcutaneous Q24H  . furosemide  80 mg Intravenous Q12H  . insulin aspart  0-5 Units Subcutaneous QHS  . insulin aspart  0-9 Units Subcutaneous TID WC  . isosorbide dinitrate  20 mg Oral Daily  . lisinopril  2.5 mg Oral Daily  . potassium chloride  40 mEq Oral Once  . potassium chloride  40 mEq Oral Daily  . predniSONE  40 mg Oral Q breakfast  . pregabalin  75 mg Oral BID  . senna-docusate  2 tablet Oral Daily  . Warfarin - Pharmacist Dosing Inpatient   Does not apply q1800       Assessment/Plan:  CVA:  History of chronic afib    Not clear to me why he is not on systemic heparin at this  Point while being coumadinized?? CHF:  On beta blocker Low dose ACE started yesterday Would titrate in 24 hrs.  Decrease iv lasix to 40 bid And  change to PO in 48 hours  F/U will be with [facility] Cardiology  Charlton Haws 07/08/2013, 10:57 AM

## 2013-07-08 NOTE — Progress Notes (Signed)
ANTICOAGULATION CONSULT NOTE  Pharmacy Consult for Coumadin Indication: atrial fibrillation  Allergies  Allergen Reactions  . Actos [Pioglitazone]     Labs:  Recent Labs  07/06/13 0635 07/06/13 1025 07/07/13 0500 07/07/13 1030 07/08/13 0548  HGB  --   --   --  12.0*  --   HCT  --   --   --  35.3*  --   PLT  --   --   --  329  --   LABPROT 16.0*  --  15.5*  --  17.9*  INR 1.31  --  1.26  --  1.52*  CREATININE  --  1.24 1.27  --  1.42*    Estimated Creatinine Clearance: 65.4 ml/min (by C-G formula based on Cr of 1.42).  Assessment: 62 year old male with a history of Afib, HTN, DM, and CHF (EF=15%) presented from SNF with acute onset of left sided weakness and facial droop.  Beginning Coumadin for Afib and new CVA.  INR today = 1.5, finally trending upward, no bleeding issues noted.  Goal of Therapy:  INR 2-3 Monitor platelets by anticoagulation protocol: Yes   Plan:  1) Repeat Coumadin 10 mg po x 1 to hope to achieve therapeutic prior to d/c 2) Daily INR 3) Coumadin education done  Sheppard Coil PharmD., BCPS Clinical Pharmacist Pager [phone] 07/08/2013 12:15 PM

## 2013-07-09 DIAGNOSIS — M109 Gout, unspecified: Secondary | ICD-10-CM

## 2013-07-09 DIAGNOSIS — I5033 Acute on chronic diastolic (congestive) heart failure: Secondary | ICD-10-CM

## 2013-07-09 DIAGNOSIS — G4733 Obstructive sleep apnea (adult) (pediatric): Secondary | ICD-10-CM | POA: Diagnosis present

## 2013-07-09 LAB — BASIC METABOLIC PANEL
Chloride: 93 mEq/L — ABNORMAL LOW (ref 96–112)
GFR calc Af Amer: 58 mL/min — ABNORMAL LOW (ref >90)
GFR calc non Af Amer: 50 mL/min — ABNORMAL LOW (ref >90)
Potassium: 3.6 mEq/L (ref 3.5–5.1)
Sodium: 133 mEq/L — ABNORMAL LOW (ref 135–145)

## 2013-07-09 LAB — GLUCOSE, CAPILLARY
Glucose-Capillary: 128 mg/dL — ABNORMAL HIGH (ref 70–99)
Glucose-Capillary: 138 mg/dL — ABNORMAL HIGH (ref 70–99)
Glucose-Capillary: 119 mg/dL — ABNORMAL HIGH (ref 70–99)

## 2013-07-09 LAB — PROTIME-INR: Prothrombin Time: 22.2 seconds — ABNORMAL HIGH (ref 11.6–15.2)

## 2013-07-09 MED ORDER — WARFARIN SODIUM 10 MG PO TABS
10.0000 mg | ORAL_TABLET | Freq: Once | ORAL | Status: DC
  Filled 2013-07-09: qty 1

## 2013-07-09 MED ORDER — PREDNISONE 20 MG PO TABS: 40.0000 mg | ORAL_TABLET | Freq: Every day | ORAL | Status: DC

## 2013-07-09 MED ORDER — INSULIN ASPART 100 UNIT/ML ~~LOC~~ SOLN: 0.0000 [IU] | Freq: Three times a day (TID) | SUBCUTANEOUS | Status: DC

## 2013-07-09 MED ORDER — FUROSEMIDE 80 MG PO TABS: 80.0000 mg | ORAL_TABLET | Freq: Every day | ORAL | Status: DC

## 2013-07-09 MED ORDER — CEFTRIAXONE SODIUM 1 G IJ SOLR: 1.0000 g | INTRAMUSCULAR | Status: AC

## 2013-07-09 MED ORDER — LEVALBUTEROL HCL 0.63 MG/3ML IN NEBU: 0.6300 mg | INHALATION_SOLUTION | Freq: Four times a day (QID) | RESPIRATORY_TRACT | Status: DC | PRN

## 2013-07-09 MED ORDER — FUROSEMIDE 80 MG PO TABS: 80.0000 mg | ORAL_TABLET | Freq: Two times a day (BID) | ORAL | Status: DC

## 2013-07-09 MED ORDER — POTASSIUM CHLORIDE CRYS ER 20 MEQ PO TBCR: 40.0000 meq | EXTENDED_RELEASE_TABLET | Freq: Every day | ORAL | Status: DC

## 2013-07-09 MED ORDER — LISINOPRIL 2.5 MG PO TABS: 2.5000 mg | ORAL_TABLET | Freq: Every day | ORAL | Status: DC

## 2013-07-09 MED ORDER — WARFARIN SODIUM 10 MG PO TABS: 10.0000 mg | ORAL_TABLET | Freq: Once | ORAL | Status: DC

## 2013-07-09 MED ORDER — POTASSIUM CHLORIDE CRYS ER 20 MEQ PO TBCR
20.0000 meq | EXTENDED_RELEASE_TABLET | Freq: Every day | ORAL | Status: DC
  Administered 2013-07-09: 20 meq via ORAL
  Filled 2013-07-09: qty 1

## 2013-07-09 NOTE — Care Management Note (Unsigned)
    Page 1 of 1   07/09/2013     11:32:57 AM   CARE MANAGEMENT NOTE 07/09/2013  Patient:  JONTEZ, REDFIELD   Account Number:  [id number]  Date Initiated:  07/09/2013  Documentation initiated by:  Jiles Crocker  Subjective/Objective Assessment:   ADMITTED WITH STROKE     Action/Plan:   PATIENT RESIDES IN A SKILLED NURSING FACILITY; SOC WORKER REFERRAL PLACED   Anticipated DC Date:  07/16/2013   Anticipated DC Plan:  SKILLED NURSING FACILITY  In-house referral  Clinical Social Worker      DC Planning Services  CM consult      Choice offered to / List presented to:             Status of service:  In process, will continue to follow Medicare Important Message given?  NA - LOS <3 / Initial given by admissions (If response is "NO", the following Medicare IM given date fields will be blank) Date Medicare IM given:   Date Additional Medicare IM given:    Discharge Disposition:    Per UR Regulation:  Reviewed for med. necessity/level of care/duration of stay  If discussed at Long Length of Stay Meetings, dates discussed:    Comments:  07/09/13 1120 Elmer Bales RN, MSN, CM- Met with patient to provide information on obtaining a PCP. CM encouraged patient to call as soon as possible to establish care.   07/09/2013- B CHANDLER RN,BSN,MHA

## 2013-07-09 NOTE — Discharge Summary (Addendum)
Physician Discharge Summary  Frank Davidson WUJ:[id number] DOB: 1951/01/22 DOA: 07/03/2013  PCP: Patient is currently followed at [facility] skilled nursing facility  Admit date: 07/03/2013 Discharge date: 07/09/2013  Time spent: 40 minutes  Recommendations for Outpatient Follow-up:  #1.  Please monitor daily weight and strict intake and output given  his heart failure symptoms. #2. Please check BMET every other day for at least next 7 days to monitor his renal function and potassium level closely .  #3. please monitor INR daily. #4. Please establish a PCP in the community before he is planned for discharge from the rehab. #5. patient needs to follow up with Dr Verdis Prime ( cardiology) in 4 weeks and with Dr Pearlean Brownie ( neurology ) in 8 weeks. #6 . Please arrange for outpatient sleep study for symptoms of obstructive sleep apnea.    Discharge Diagnoses:  Principal Problem:   Acute ischemic stroke  Active Problems:   Nonischemic cardiomyopathy   Acute on chronic diastolic CHF (congestive heart failure), NYHA class 3   A-fib   Hypertension   COPD (chronic obstructive pulmonary disease)   Diabetes mellitus without complication   CKD (chronic kidney disease), stage III   Hypokalemia   Chronic systolic heart failure   Gout attack   OSA (obstructive sleep apnea)   Obesity   UTI   Discharge Condition: Fair  Diet recommendation: Cardiac  Filed Weights   07/05/13 2142  Weight: 117.799 kg (259 lb 11.2 oz)    History of present illness:  Please refer to admission H&P for details but in brief,62 year old obese male with history of A. fib, hypertension, diabetes mellitus, nonischemic cardiomyopathy with EF of 25%, CKD. Stage 3 sent from skilled nursing facility with acute onset of left-sided weakness and facial droop. CT head suggestive of right insular infarct. No TPA given in the ED as patient was out of TPA window.  Patient admitted for stroke workup. Hospital course prolonged  with CHF exacerbation.   Hospital Course:  Acute right sided infarct  MRI brain shows "Moderate to large-sized right hemispheric acute infarct extends from the right opercular/sub insular region into the right frontal lobe bordering the anterior aspect of the right parietal lobe."  Patient has residual left-sided weakness.  Continued on  aspirin 325 mg daily. He was started on coumadin given hx of afib with high CHADS2 score of 5. Continued ASA until INR therapeutic. INR is 2.02 today so we will discontinue aspirin. -2D echo was significant cardiomyopathy with EF of 20-25% with diffuse hypokinesis. -Carotid Doppler was a limited study given body habitus but did not show any significant stenosis. Not on statin as LDL at goal. Hemoglobin A1c of 6.  continue PT /OT at skilled nursing facility. His weakness has markedly improved. Patient will be discharged back facility and followed there.  A. fib  Continue coreg . added Coumadin and INR is therapeutic today. He will be discharged on 10 mg of Coumadin daily. INR needs to be monitored daily and dose adjusted. Aspirin discontinued. Rate controlled on the monitor.   Nonischemic cardiomyopathy  -Patient's carvedilol and Lasix wasn't resumed -During hospital stay he was noted to have worsening leg edema and progressive shortness of breath. -EF of 20-25 % on 2D echo with diffuse hypokinesis. Patient moved to the area recently from [place] and does not have a cardiologist here. [facility] cardiology consulted. Placed on IV lasix 80 mg bid. He has been diuresing well and has been negative by 8.5 L since admission. Added imdur and ow  dose ACEi by cardiology.  -Symptomatically much improved and leg edema improving as well. We will reduce her Lasix dose to 80 mg by mouth twice daily. Patient was on metolazone at home which we have discontinued for now. -Patient symptoms have much improved now and he will follow up with Dr. Katrinka Blazing we [facility] cardiology in 4  weeks. Family were concerned about placing an AICD given his low EF which his cardiologist will address during outpatient followup.  Myoclonic jerks of legs  Unclear duration. D/ced neurontin and oxycodone and has improved.  CKD stage 3  Renal function slightly worsened in the setting of diuretics with creatinine of 1.44 daily. Should improve off her Lasix was adjusted. Please check renal function every other day for at least one week. He is discontinuing ACE inhibitor if renal function continues to worsen.  Diabetes mellitus  Continue sliding-scale insulin . A1C well controlled at 6. Continue glipizide  Hypokalemia  Replenished . Continue daily kcl and monitor BMET every other day .  COPD  Continue when necessary nebs   Obesity  counseled on weight loss and dietary modifications. Will benefit from sleep study as outpatient given sleep apnea symptoms  Acute gout  Patient had left knee pain and swelling. Uric acid elevated. X-ray shows arthritic changes. Treating with a course of oral prednisone and has improved. Continue allopurinol   Hematuria  noted on 8/13. UA suggestive of UTI and numerous RBCs. renal US unremarkable. Hb stable. No further episode.    UTI  Urine cx growing multiple bacteria on IV Rocephin.( day4) will switch to IM rocephin on discharge to complete a 7 day course on 8/21   Procedures:  MRI brain  2-D echo  Carotid Dopplers  Consultations:  Neurology  Dr. Katrinka Blazing [facility] cardiology)  Discharge Exam: Filed Vitals:   07/09/13 1000  BP: 106/69  Pulse: 77  Temp: 98.3 F (36.8 C)  Resp: 20    General: Middle aged obese male in no acute distress  HEENT: No pallor, moist oral mucosa  Chest: Clear to auscultation bilaterally, no added sounds  CVS: Normal S1-S2, no murmurs rub or gallop  Abdomen: Soft, nontender, nondistended, bowel sounds present  Extremities: 1+ pitting edema over bilateral leg (much improved) . Swelling over left knee markedly  improved and nontender. CNS: AAO x3 power 4 x 5 over left extremity.    Discharge Instructions     Medication List    STOP taking these medications       aspirin 325 MG tablet     gabapentin 300 MG capsule  Commonly known as:  NEURONTIN     metolazone 5 MG tablet  Commonly known as:  ZAROXOLYN     oxyCODONE 10 MG 12 hr tablet  Commonly known as:  OXYCONTIN      TAKE these medications       allopurinol 100 MG tablet  Commonly known as:  ZYLOPRIM  Take 100 mg by mouth daily.     carvedilol 25 MG tablet  Commonly known as:  COREG  Take 25 mg by mouth 2 (two) times daily with a meal.     furosemide 80 MG tablet  Commonly known as:  LASIX  Take 1 tablet (80 mg total) by mouth 2 (two) times daily.     glipiZIDE 10 MG tablet  Commonly known as:  GLUCOTROL  Take 10 mg by mouth 2 (two) times daily before a meal.     insulin aspart 100 UNIT/ML injection  Commonly known as:  novoLOG  Inject 0-9 Units into the skin 3 (three) times daily with meals.     isosorbide dinitrate 20 MG tablet  Commonly known as:  ISORDIL  Take 20 mg by mouth daily.     levalbuterol 0.63 MG/3ML nebulizer solution  Commonly known as:  XOPENEX  Take 3 mL (0.63 mg total) by nebulization every 6 (six) hours as needed for wheezing or shortness of breath.     lisinopril 2.5 MG tablet  Commonly known as:  PRINIVIL,ZESTRIL  Take 1 tablet (2.5 mg total) by mouth daily.     potassium chloride SA 20 MEQ tablet  Commonly known as:  K-DUR,KLOR-CON  Take 2 tablets (40 mEq total) by mouth daily.     predniSONE 20 MG tablet  Commonly known as:  DELTASONE  Take 2 tablets (40 mg total) by mouth daily with breakfast. ( until 8/19)     pregabalin 75 MG capsule  Commonly known as:  LYRICA  Take 75 mg by mouth 2 (two) times daily.     senna-docusate 8.6-50 MG per tablet  Commonly known as:  Senokot-S  Take 2 tablets by mouth daily.     warfarin 10 MG tablet  Commonly known as:  COUMADIN  Take 1  tablet (10 mg total) by mouth one time only at 6 PM.        Injection Rocephin 1 gm  IM daily for next 3 days starting 8/19  Allergies  Allergen Reactions  . Actos [Pioglitazone]        Follow-up Information   Follow up with Gates Rigg, MD. Schedule an appointment as soon as possible for a visit in 2 months. (stroke clinic)    Specialties:  Neurology, Radiology   Contact information:   [address] [place] [address] [phone]       Please follow up. (follow up within 1 week at SNF. needs PCP care established prior to discharge from SNF)        The results of significant diagnostics from this hospitalization (including imaging, microbiology, ancillary and laboratory) are listed below for reference.    Significant Diagnostic Studies: Ct Angio Head W/cm &/or Wo Cm  07/03/2013   *RADIOLOGY REPORT*  Clinical Data: 62 year old male Code stroke with left facial droop, right MCA territory symptoms.  Known decreased cardiac ejection fraction.  Contrast:  100 ml Omnipaque 350 (50 ml for attempted CTA, 50 ml for subsequent CT perfusion).  Comparison: Head CT without contrast 07/03/2013.  CT CEREBRAL PERFUSION WITH CONTRAST  Technique: Multidetector CT brain perfusion study performed during bolus IV contrast administration. Data analysis and post processing performed.   Findings: Suboptimal but felt to be diagnostic arterial input function.  Good venous input function. Perfusion maps demonstrate decreased cerebral blood flow and cerebral blood volume in the right MCA territory centered at the insula and operculum.  ROI measurements in the right MCA / opercular region reveal: - Cerebral blood volume 1.8 mL/100g (versus 3.20 left MCA) - Cerebral blood flow 7.1 mL/100g/min (versus 26.3 left MCA) - Mean transit time 20.2 seconds (versus 12.4 seconds) - Time to peak 23.9 seconds. (versus a 36.3 seconds).  IMPRESSION: 1.  CT perfusion suggests right MCA infarct core centered  at the insula/operculum. Findings reviewed in person with Dr. Onalee Hua at 1045 hours on 07/03/2013. 2.  Questionable benign oligemia in the contralateral left MCA territory, versus artifact due to cross suboptimal arterial input function. 3. Attempted CTA head and neck report is below.  CT  ANGIOGRAPHY HEAD AND NECK  Technique:  Multidetector CT imaging of the head and neck was performed using the standard protocol during bolus administration of intravenous contrast.  Multiplanar CT image reconstructions including MIPs were obtained to evaluate the vascular anatomy. Carotid stenosis measurements (when applicable) are obtained utilizing NASCET criteria, using the distal internal carotid diameter as the denominator.  CTA NECK  Findings:  Early contrast bolus timing, such that there is no aortic arch or carotid/vertebral artery contrast at the time of image acquisition. After discussing with Dr. Amada Jupiter, decision was made to defer any repeat CTA attempt and proceed with a CT brain perfusion study (report above).  Mild ground-glass opacity in the visualized upper lobes. Mediastinal lipomatosis.  Small superior mediastinal lymph nodes are within normal limits.  Atelectatic changes to the trachea. Negative thyroid.  Larynx and pharynx within normal limits. Parapharyngeal, retropharyngeal (incidental bilateral carotid artery retropharyngeal course), and sublingual spaces are within normal limits.  Negative submandibular and parotid glands. Postoperative changes to the orbits.  Chronic right lamina papyracea fracture. Visualized paranasal sinuses and mastoids are clear.  No cervical lymphadenopathy identified.  Widespread degenerative changes in the cervical spine with disc space loss, occasional vacuum disc phenomena, and endplate spurring. No acute osseous abnormality identified.  Mild calcified atherosclerosis of the aortic arch.  Mild calcified atherosclerosis of the proximal great vessels.  In adequate  intraarterial contrast timing in the neck.  Calcified atherosclerosis of both carotid bifurcations.   Review of the MIP images confirms the above findings.  IMPRESSION: 1.  Inadequate intraarterial contrast bolus timing.  Calcified atherosclerosis in the neck.   After discussing with Dr. Amada Jupiter, decision was made to defer any repeat CTA attempt and proceed with a CT brain perfusion study (reported above). Incidental retropharyngeal course of both cervical carotid arteries. 2.  See intracranial findings below. 3.  Widespread cervical spine degeneration.  CTA HEAD  Findings:  Postcontrast images of the head redemonstrated left MCA territory chronic encephalomalacia.  No abnormal hyperenhancement. No intracranial mass effect.  No ventriculomegaly.  Hypodensity at the right insula re-identified.  Decreased visualization of the right MCA M1 segment compared to the left.  Major intracranial venous structures appear to be normally enhancing.  Calvarium intact. Visualized scalp soft tissues are within normal limits.  Extensive left greater than right ICA siphon calcified atherosclerosis.  In adequate intraarterial contrast timing.   Review of the MIP images confirms the above findings.  IMPRESSION: 1.  Evidence of acute cortically based infarct at the right insula. No mass effect or hemorrhage.  Postcontrast images do demonstrate decreased visualization of the right MCA M1 segment.  2.  Inadequate intraarterial contrast timing.  Left worse than right widespread ICA siphon calcified atherosclerosis. After discussing with Dr. Amada Jupiter, decision was made to defer any repeat CTA attempt and proceed with a CT brain perfusion study (reported above). 3.  Chronic left MCA ischemia.   Original Report Authenticated By: Erskine Speed, M.D.   Dg Chest 2 View  07/03/2013   *RADIOLOGY REPORT*  Clinical Data: Stroke patient, history hypertension, diabetes, smoking  CHEST - 2 VIEW  Comparison: None.  Findings: No active infiltrate  or effusion is seen.  Probable linear scarring or atelectasis is noted in the right mid lung. Moderate cardiomegaly is present.  There are diffuse degenerative changes throughout the thoracic spine.  IMPRESSION: Moderate cardiomegaly.  No active lung disease.   Original Report Authenticated By: Dwyane Dee, M.D.   Dg Knee 1-2 Views Left  07/06/2013   *  RADIOLOGY REPORT*  Clinical Data: Pain for several years.  Worsening recently.  LEFT KNEE - 1-2 VIEW  Comparison: None.  Findings: Patellofemoral joint and mild medial tibiofemoral joint space degenerative changes.  Moderate sized joint effusion.  No fracture or dislocation detected on this two-view exam.  IMPRESSION: Patellofemoral joint and mild medial tibiofemoral joint space degenerative changes.  Moderate sized joint effusion.   Original Report Authenticated By: Lacy Duverney, M.D.   Ct Head Wo Contrast  07/03/2013   *RADIOLOGY REPORT*  Clinical Data: Code stroke.  Left facial droop  CT HEAD WITHOUT CONTRAST  Technique:  Contiguous axial images were obtained from the base of the skull through the vertex without contrast.  Comparison: None.  Findings: Poorly defined hypodensity in the right insula, consistent with acute infarct in the right MCA territory.  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 Chronic infarct in the posterior internal capsule on the left.  Age appropriate atrophy.  Negative for hydrocephalus.  Negative for hemorrhage or mass.  IMPRESSION: Ill-defined hypodensity and edema in the right insular cortex compatible with acute infarct.  Negative for hemorrhage.  Critical Value/emergent results were called by telephone at the time of interpretation on 07/03/2013 at 9:36 hours to Dr. Petra Kuba, who verbally acknowledged these results.   Original Report Authenticated By: Janeece Riggers, M.D.   Ct Angio Neck W/cm &/or Wo/cm  07/03/2013   *RADIOLOGY REPORT*  Clinical Data: 62 year old male Code stroke with left facial droop, right  MCA territory symptoms.  Known decreased cardiac ejection fraction.  Contrast:  100 ml Omnipaque 350 (50 ml for attempted CTA, 50 ml for subsequent CT perfusion).  Comparison: Head CT without contrast 07/03/2013.  CT CEREBRAL PERFUSION WITH CONTRAST  Technique: Multidetector CT brain perfusion study performed during bolus IV contrast administration. Data analysis and post processing performed.   Findings: Suboptimal but felt to be diagnostic arterial input function.  Good venous input function. Perfusion maps demonstrate decreased cerebral blood flow and cerebral blood volume in the right MCA territory centered at the insula and operculum.  ROI measurements in the right MCA / opercular region reveal: - Cerebral blood volume 1.8 mL/100g (versus 3.20 left MCA) - Cerebral blood flow 7.1 mL/100g/min (versus 26.3 left MCA) - Mean transit time 20.2 seconds (versus 12.4 seconds) - Time to peak 23.9 seconds. (versus a 36.3 seconds).  IMPRESSION: 1.  CT perfusion suggests right MCA infarct core centered at the insula/operculum. Findings reviewed in person with Dr. Onalee Hua at 1045 hours on 07/03/2013. 2.  Questionable benign oligemia in the contralateral left MCA territory, versus artifact due to cross suboptimal arterial input function. 3. Attempted CTA head and neck report is below.  CT ANGIOGRAPHY HEAD AND NECK  Technique:  Multidetector CT imaging of the head and neck was performed using the standard protocol during bolus administration of intravenous contrast.  Multiplanar CT image reconstructions including MIPs were obtained to evaluate the vascular anatomy. Carotid stenosis measurements (when applicable) are obtained utilizing NASCET criteria, using the distal internal carotid diameter as the denominator.  CTA NECK  Findings:  Early contrast bolus timing, such that there is no aortic arch or carotid/vertebral artery contrast at the time of image acquisition. After discussing with Dr. Amada Jupiter, decision  was made to defer any repeat CTA attempt and proceed with a CT brain perfusion study (report above).  Mild ground-glass opacity in the visualized upper lobes. Mediastinal lipomatosis.  Small superior mediastinal lymph nodes are within normal limits.  Atelectatic changes to  the trachea. Negative thyroid.  Larynx and pharynx within normal limits. Parapharyngeal, retropharyngeal (incidental bilateral carotid artery retropharyngeal course), and sublingual spaces are within normal limits.  Negative submandibular and parotid glands. Postoperative changes to the orbits.  Chronic right lamina papyracea fracture. Visualized paranasal sinuses and mastoids are clear.  No cervical lymphadenopathy identified.  Widespread degenerative changes in the cervical spine with disc space loss, occasional vacuum disc phenomena, and endplate spurring. No acute osseous abnormality identified.  Mild calcified atherosclerosis of the aortic arch.  Mild calcified atherosclerosis of the proximal great vessels.  In adequate intraarterial contrast timing in the neck.  Calcified atherosclerosis of both carotid bifurcations.   Review of the MIP images confirms the above findings.  IMPRESSION: 1.  Inadequate intraarterial contrast bolus timing.  Calcified atherosclerosis in the neck.   After discussing with Dr. Amada Jupiter, decision was made to defer any repeat CTA attempt and proceed with a CT brain perfusion study (reported above). Incidental retropharyngeal course of both cervical carotid arteries. 2.  See intracranial findings below. 3.  Widespread cervical spine degeneration.  CTA HEAD  Findings:  Postcontrast images of the head redemonstrated left MCA territory chronic encephalomalacia.  No abnormal hyperenhancement. No intracranial mass effect.  No ventriculomegaly.  Hypodensity at the right insula re-identified.  Decreased visualization of the right MCA M1 segment compared to the left.  Major intracranial venous structures appear to be  normally enhancing.  Calvarium intact. Visualized scalp soft tissues are within normal limits.  Extensive left greater than right ICA siphon calcified atherosclerosis.  In adequate intraarterial contrast timing.   Review of the MIP images confirms the above findings.  IMPRESSION: 1.  Evidence of acute cortically based infarct at the right insula. No mass effect or hemorrhage.  Postcontrast images do demonstrate decreased visualization of the right MCA M1 segment.  2.  Inadequate intraarterial contrast timing.  Left worse than right widespread ICA siphon calcified atherosclerosis. After discussing with Dr. Amada Jupiter, decision was made to defer any repeat CTA attempt and proceed with a CT brain perfusion study (reported above). 3.  Chronic left MCA ischemia.   Original Report Authenticated By: Erskine Speed, M.D.   Mr Brain Wo Contrast  07/03/2013   *RADIOLOGY REPORT*  Clinical Data:  Left-sided weakness.  Facial droop.  Hypertensive diabetic patient with history of atrial fibrillation.  MRI BRAIN WITHOUT CONTRAST MRA HEAD WITHOUT CONTRAST  Technique: Multiplanar, multiecho pulse sequences of the brain and surrounding structures were obtained according to standard protocol without intravenous contrast.  Angiographic images of the head were obtained using MRA technique without contrast.  Comparison: CT perfusion examination 07/03/2013.  Cervical spondylotic changes with spinal stenosis and cord flattening C3-4 level.  MRI HEAD  Findings:  Motion degraded exam limited to diffusion imaging and sagittal axial T1 sequence.  Moderate to large-sized right hemispheric acute infarct extends from the right opercular/sub insular region into the right frontal lobe bordering the anterior aspect of the right parietal lobe.  No obvious intracranial hemorrhage although gradient sequence not obtained.  Remote left parietal lobe infarct encephalomalacia.  Remote posterior left lenticular nucleus/posterior limb left internal capsule  infarct.  Small vessel disease type changes.  Atrophy without hydrocephalus.  No obvious mass.  IMPRESSION: Motion degraded exam limited to diffusion imaging and sagittal axial T1 sequence.  Moderate to large-sized right hemispheric acute infarct extends from the right opercular/sub insular region into the right frontal lobe bordering the anterior aspect of the right parietal lobe.  No obvious intracranial hemorrhage although  gradient sequence not obtained.  Remote left parietal lobe infarct encephalomalacia.  Remote posterior left lenticular nucleus/posterior limb left internal capsule infarct.  Small vessel disease type changes.  Atrophy without hydrocephalus.  MRA HEAD  Findings: Exam significantly limited by motion.  Grading stenosis or evaluating for aneurysm is limited.  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  Flow is noted within the internal carotid arteries with suggestion of moderate narrowing of the pre cavernous and cavernous segment of the left internal carotid artery with mild narrowing and ectasia of the supraclinoid aspect of the internal carotid artery bilaterally.  Limited evaluation of branch vessels with suggestion of significant intracranial atherosclerotic type changes.  Aneurysm cannot be excluded or confirmed on the present exam.  IMPRESSION: Markedly motion degraded exam.  Please see above.  This has been made a PRA call report utilizing dashboard call feature.   Original Report Authenticated By: Lacy Duverney, M.D.   US Renal  07/06/2013   *RADIOLOGY REPORT*  Clinical Data: Unexplained hematuria.  RENAL/URINARY TRACT ULTRASOUND COMPLETE  Comparison:  None.  Findings:  Right Kidney:  No hydronephrosis.  Well-preserved cortex.  No shadowing calculi.  Normal size and parenchymal echotexture without focal abnormalities.  Approximate 11.1 cm in length.  Left Kidney:  No hydronephrosis.   Well-preserved cortex.  Prominent column of Bertin.  No shadowing calculi.  Normal size and parenchymal echotexture without significant focal parenchymal abnormality.  Approximately 12.3 cm in length.  Bladder:  Decompressed and normal in appearance.  IMPRESSION: Normal examination.   Original Report Authenticated By: Hulan Saas, M.D.   Ct Cerebral Perfusion W/cm  07/03/2013   *RADIOLOGY REPORT*  Clinical Data: 62 year old male Code stroke with left facial droop, right MCA territory symptoms.  Known decreased cardiac ejection fraction.  Contrast:  100 ml Omnipaque 350 (50 ml for attempted CTA, 50 ml for subsequent CT perfusion).  Comparison: Head CT without contrast 07/03/2013.  CT CEREBRAL PERFUSION WITH CONTRAST  Technique: Multidetector CT brain perfusion study performed during bolus IV contrast administration. Data analysis and post processing performed.   Findings: Suboptimal but felt to be diagnostic arterial input function.  Good venous input function. Perfusion maps demonstrate decreased cerebral blood flow and cerebral blood volume in the right MCA territory centered at the insula and operculum.  ROI measurements in the right MCA / opercular region reveal: - Cerebral blood volume 1.8 mL/100g (versus 3.20 left MCA) - Cerebral blood flow 7.1 mL/100g/min (versus 26.3 left MCA) - Mean transit time 20.2 seconds (versus 12.4 seconds) - Time to peak 23.9 seconds. (versus a 36.3 seconds).  IMPRESSION: 1.  CT perfusion suggests right MCA infarct core centered at the insula/operculum. Findings reviewed in person with Dr. Onalee Hua at 1045 hours on 07/03/2013. 2.  Questionable benign oligemia in the contralateral left MCA territory, versus artifact due to cross suboptimal arterial input function. 3. Attempted CTA head and neck report is below.  CT ANGIOGRAPHY HEAD AND NECK  Technique:  Multidetector CT imaging of the head and neck was performed using the standard protocol during bolus administration  of intravenous contrast.  Multiplanar CT image reconstructions including MIPs were obtained to evaluate the vascular anatomy. Carotid stenosis measurements (when applicable) are obtained utilizing NASCET criteria, using the distal internal carotid diameter as the denominator.  CTA NECK  Findings:  Early contrast bolus timing, such that there is no aortic arch or carotid/vertebral artery contrast at the time of image acquisition. After  discussing with Dr. Amada Jupiter, decision was made to defer any repeat CTA attempt and proceed with a CT brain perfusion study (report above).  Mild ground-glass opacity in the visualized upper lobes. Mediastinal lipomatosis.  Small superior mediastinal lymph nodes are within normal limits.  Atelectatic changes to the trachea. Negative thyroid.  Larynx and pharynx within normal limits. Parapharyngeal, retropharyngeal (incidental bilateral carotid artery retropharyngeal course), and sublingual spaces are within normal limits.  Negative submandibular and parotid glands. Postoperative changes to the orbits.  Chronic right lamina papyracea fracture. Visualized paranasal sinuses and mastoids are clear.  No cervical lymphadenopathy identified.  Widespread degenerative changes in the cervical spine with disc space loss, occasional vacuum disc phenomena, and endplate spurring. No acute osseous abnormality identified.  Mild calcified atherosclerosis of the aortic arch.  Mild calcified atherosclerosis of the proximal great vessels.  In adequate intraarterial contrast timing in the neck.  Calcified atherosclerosis of both carotid bifurcations.   Review of the MIP images confirms the above findings.  IMPRESSION: 1.  Inadequate intraarterial contrast bolus timing.  Calcified atherosclerosis in the neck.   After discussing with Dr. Amada Jupiter, decision was made to defer any repeat CTA attempt and proceed with a CT brain perfusion study (reported above). Incidental retropharyngeal course of both  cervical carotid arteries. 2.  See intracranial findings below. 3.  Widespread cervical spine degeneration.  CTA HEAD  Findings:  Postcontrast images of the head redemonstrated left MCA territory chronic encephalomalacia.  No abnormal hyperenhancement. No intracranial mass effect.  No ventriculomegaly.  Hypodensity at the right insula re-identified.  Decreased visualization of the right MCA M1 segment compared to the left.  Major intracranial venous structures appear to be normally enhancing.  Calvarium intact. Visualized scalp soft tissues are within normal limits.  Extensive left greater than right ICA siphon calcified atherosclerosis.  In adequate intraarterial contrast timing.   Review of the MIP images confirms the above findings.  IMPRESSION: 1.  Evidence of acute cortically based infarct at the right insula. No mass effect or hemorrhage.  Postcontrast images do demonstrate decreased visualization of the right MCA M1 segment.  2.  Inadequate intraarterial contrast timing.  Left worse than right widespread ICA siphon calcified atherosclerosis. After discussing with Dr. Amada Jupiter, decision was made to defer any repeat CTA attempt and proceed with a CT brain perfusion study (reported above). 3.  Chronic left MCA ischemia.   Original Report Authenticated By: Erskine Speed, M.D.   Mr Mra Head/brain Wo Cm  07/03/2013   *RADIOLOGY REPORT*  Clinical Data:  Left-sided weakness.  Facial droop.  Hypertensive diabetic patient with history of atrial fibrillation.  MRI BRAIN WITHOUT CONTRAST MRA HEAD WITHOUT CONTRAST  Technique: Multiplanar, multiecho pulse sequences of the brain and surrounding structures were obtained according to standard protocol without intravenous contrast.  Angiographic images of the head were obtained using MRA technique without contrast.  Comparison: CT perfusion examination 07/03/2013.  Cervical spondylotic changes with spinal stenosis and cord flattening C3-4 level.  MRI HEAD  Findings:   Motion degraded exam limited to diffusion imaging and sagittal axial T1 sequence.  Moderate to large-sized right hemispheric acute infarct extends from the right opercular/sub insular region into the right frontal lobe bordering the anterior aspect of the right parietal lobe.  No obvious intracranial hemorrhage although gradient sequence not obtained.  Remote left parietal lobe infarct encephalomalacia.  Remote posterior left lenticular nucleus/posterior limb left internal capsule infarct.  Small vessel disease type changes.  Atrophy without hydrocephalus.  No obvious mass.  IMPRESSION: Motion degraded exam limited to diffusion imaging and sagittal axial T1 sequence.  Moderate to large-sized right hemispheric acute infarct extends from the right opercular/sub insular region into the right frontal lobe bordering the anterior aspect of the right parietal lobe.  No obvious intracranial hemorrhage although gradient sequence not obtained.  Remote left parietal lobe infarct encephalomalacia.  Remote posterior left lenticular nucleus/posterior limb left internal capsule infarct.  Small vessel disease type changes.  Atrophy without hydrocephalus.  MRA HEAD  Findings: Exam significantly limited by motion.  Grading stenosis or evaluating for aneurysm is limited.  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  Flow is noted within the internal carotid arteries with suggestion of moderate narrowing of the pre cavernous and cavernous segment of the left internal carotid artery with mild narrowing and ectasia of the supraclinoid aspect of the internal carotid artery bilaterally.  Limited evaluation of branch vessels with suggestion of significant intracranial atherosclerotic type changes.  Aneurysm cannot be excluded or confirmed on the present exam.  IMPRESSION: Markedly motion degraded exam.  Please see above.  This has been made a  PRA call report utilizing dashboard call feature.   Original Report Authenticated By: Lacy Duverney, M.D.    Microbiology: Recent Results (from the past 240 hour(s))  URINE CULTURE     Status: None   Collection Time    07/07/13  8:05 AM      Result Value Range Status   Specimen Description URINE, RANDOM   Final   Special Requests NONE   Final   Culture  Setup Time     Final   Value: 07/07/2013 10:17     Performed at [facility] Count     Final   Value: >=100,000 COLONIES/ML     Performed at [facility]   Culture     Final   Value: Multiple bacterial morphotypes present, none predominant. Suggest appropriate recollection if clinically indicated.     Performed at [facility]   Report Status 07/08/2013 FINAL   Final     Labs: Basic Metabolic Panel:  Recent Labs Lab 07/05/13 0715 07/06/13 1025 07/07/13 0500 07/07/13 1030 07/08/13 0548 07/09/13 0625  NA 134* 131* 131*  --  133* 133*  K 3.1* 3.6 3.3* 3.3* 3.1* 3.6  CL 95* 93* 93*  --  91* 93*  CO2 25 26 26   --  28 29  GLUCOSE 127* 164* 121*  --  176* 118*  BUN 16 17 19   --  25* 40*  CREATININE 1.13 1.24 1.27  --  1.42* 1.44*  CALCIUM 9.6 9.7 9.6  --  9.3 9.5   Liver Function Tests:  Recent Labs Lab 07/03/13 0927  AST 33  ALT 26  ALKPHOS 120*  BILITOT 0.9  PROT 7.6  ALBUMIN 3.0*   No results found for this basename: LIPASE, AMYLASE,  in the last 168 hours No results found for this basename: AMMONIA,  in the last 168 hours CBC:  Recent Labs Lab 07/03/13 0924 07/03/13 0927 07/07/13 1030  WBC  --  5.8 7.5  NEUTROABS  --  4.2  --   HGB 14.3 12.0* 12.0*  HCT 42.0 35.6* 35.3*  MCV  --  82.2 81.1  PLT  --  322 329   Cardiac Enzymes:  Recent Labs Lab 07/03/13 0933  TROPONINI <0.30   BNP: BNP (last 3 results) No results found for this  basename: PROBNP,  in the last 8760 hours CBG:  Recent Labs Lab 07/08/13 0730 07/08/13 1141 07/08/13 1622 07/08/13 2321  07/09/13 0656  GLUCAP 145* 167* 154* 128* 138*       Signed:  Lajuana Patchell  [facility] 07/09/2013, 11:37 AM

## 2013-07-09 NOTE — Progress Notes (Signed)
ANTICOAGULATION CONSULT NOTE - Follow Up Consult  Pharmacy Consult for warfarin Indication: atrial fibrillation  Allergies  Allergen Reactions  . Actos [Pioglitazone]     Patient Measurements: Height: 5\' 7"  (170.2 cm) Weight: 259 lb 11.2 oz (117.799 kg) IBW/kg (Calculated) : 66.1   Vital Signs: Temp: 97.4 F (36.3 C) (08/18 0638) Temp src: Oral (08/18 0638) BP: 95/52 mmHg (08/18 0638) Pulse Rate: 60 (08/18 0638)  Labs:  Recent Labs  07/07/13 0500 07/07/13 1030 07/08/13 0548 07/09/13 0625  HGB  --  12.0*  --   --   HCT  --  35.3*  --   --   PLT  --  329  --   --   LABPROT 15.5*  --  17.9* 22.2*  INR 1.26  --  1.52* 2.02*  CREATININE 1.27  --  1.42* 1.44*    Estimated Creatinine Clearance: 64.5 ml/min (by C-G formula based on Cr of 1.44).   Assessment: 71 YOM with new CVA, hx AFib, CHF and CKD started on warfarin. INR this morning is therapeutic at 2.02. Hgb and Plts are stable and no bleeding noted.  Goal of Therapy:  INR 2-3 Monitor platelets by anticoagulation protocol: Yes   Plan:  1. Warfarin 10mg  po x1 tonight 2. Daily PT/INR 3. Follow for discharge planning and will leave recommendations for warfarin at appropriate time  Megin Consalvo D. Esti Demello, PharmD Clinical Pharmacist Pager: [phone] 07/09/2013 10:33 AM

## 2013-07-09 NOTE — Progress Notes (Signed)
Patient Name: Frank Davidson Date of Encounter: 07/09/2013    SUBJECTIVE:preceding better. No chest pain. Denies dizziness. Having orthopedic pain.  TELEMETRY:  Atrial fibrillation with controlled rate Filed Vitals:   07/08/13 1800 07/08/13 2200 07/09/13 0158 07/09/13 0638  BP: 108/75 108/69 115/73 95/52  Pulse: 73 79 80 60  Temp: 98.1 F (36.7 C) 98.1 F (36.7 C) 97.4 F (36.3 C) 97.4 F (36.3 C)  TempSrc: Oral Oral Axillary Oral  Resp: 20 20 19 20   Height:      Weight:      SpO2: 98% 99% 100% 100%    Intake/Output Summary (Last 24 hours) at 07/09/13 1014 Last data filed at 07/09/13 0845  Gross per 24 hour  Intake    360 ml  Output      0 ml  Net    360 ml    LABS: Basic Metabolic Panel:  Recent Labs  82/95/62 0548 07/09/13 0625  NA 133* 133*  K 3.1* 3.6  CL 91* 93*  CO2 28 29  GLUCOSE 176* 118*  BUN 25* 40*  CREATININE 1.42* 1.44*  CALCIUM 9.3 9.5   CBC:  Recent Labs  07/07/13 1030  WBC 7.5  HGB 12.0*  HCT 35.3*  MCV 81.1  PLT 329   Radiology/Studies:  No recent data  Physical Exam: Blood pressure 95/52, pulse 60, temperature 97.4 F (36.3 C), temperature source Oral, resp. rate 20, height 5\' 7"  (1.702 m), weight 117.799 kg (259 lb 11.2 oz), SpO2 100.00%. Weight change:    Irregularly irregular rhythm  1-2+ peripheral edema persists.  Speech and overall alertness has improved  ASSESSMENT:  1. Acute on chronic systolic heart failure, still mildly volume overloaded  2. Chronic kidney injury, stage II  3. Chronic atrial fibrillation on coumadin therapy  Plan:  1. Change diuretic regimen to oral, furosemide 80 mg twice a day. (He came in on furosemide 40 mg per day plus metolazone. He is still volume overloaded.)  2. Discontinue metolazone  3. These primary care involvement for his multiple medical problems.  4. Needs close followup of volume status when he returns to the skilled nursing facility and blood work monitoring to prevent  recurrent admission. I would suggest BMET every other day for a least a week to help with management of potassium and kidney function. Daily weights at the nursing facility will be very important  5. Cardiology followup once the patient is discharged from the skilled nursing facility.  Selinda Eon 07/09/2013, 10:14 AM

## 2013-08-03 ENCOUNTER — Ambulatory Visit: Payer: Self-pay | Admitting: Cardiovascular Disease

## 2013-09-04 ENCOUNTER — Inpatient Hospital Stay (HOSPITAL_COMMUNITY)
Admission: EM | Admit: 2013-09-04 | Discharge: 2013-09-09 | DRG: 291 | Disposition: A | Discharge status: Home Health | Payer: Medicare Other | Attending: Internal Medicine | Admitting: Internal Medicine

## 2013-09-04 ENCOUNTER — Emergency Department (HOSPITAL_COMMUNITY): Payer: Medicare Other

## 2013-09-04 ENCOUNTER — Encounter (HOSPITAL_COMMUNITY): Payer: Self-pay | Admitting: Emergency Medicine

## 2013-09-04 DIAGNOSIS — I69998 Other sequelae following unspecified cerebrovascular disease: Secondary | ICD-10-CM

## 2013-09-04 DIAGNOSIS — I509 Heart failure, unspecified: Secondary | ICD-10-CM

## 2013-09-04 DIAGNOSIS — M109 Gout, unspecified: Secondary | ICD-10-CM

## 2013-09-04 DIAGNOSIS — I5023 Acute on chronic systolic (congestive) heart failure: Principal | ICD-10-CM

## 2013-09-04 DIAGNOSIS — Z6833 Body mass index (BMI) 33.0-33.9, adult: Secondary | ICD-10-CM

## 2013-09-04 DIAGNOSIS — I4729 Other ventricular tachycardia: Secondary | ICD-10-CM | POA: Diagnosis present

## 2013-09-04 DIAGNOSIS — B0230 Zoster ocular disease, unspecified: Secondary | ICD-10-CM

## 2013-09-04 DIAGNOSIS — I428 Other cardiomyopathies: Secondary | ICD-10-CM

## 2013-09-04 DIAGNOSIS — I10 Essential (primary) hypertension: Secondary | ICD-10-CM

## 2013-09-04 DIAGNOSIS — I472 Ventricular tachycardia, unspecified: Secondary | ICD-10-CM | POA: Diagnosis present

## 2013-09-04 DIAGNOSIS — R531 Weakness: Secondary | ICD-10-CM | POA: Diagnosis present

## 2013-09-04 DIAGNOSIS — I639 Cerebral infarction, unspecified: Secondary | ICD-10-CM

## 2013-09-04 DIAGNOSIS — K649 Unspecified hemorrhoids: Secondary | ICD-10-CM | POA: Diagnosis present

## 2013-09-04 DIAGNOSIS — R29898 Other symptoms and signs involving the musculoskeletal system: Secondary | ICD-10-CM | POA: Diagnosis present

## 2013-09-04 DIAGNOSIS — E785 Hyperlipidemia, unspecified: Secondary | ICD-10-CM | POA: Diagnosis present

## 2013-09-04 DIAGNOSIS — E669 Obesity, unspecified: Secondary | ICD-10-CM | POA: Diagnosis present

## 2013-09-04 DIAGNOSIS — J449 Chronic obstructive pulmonary disease, unspecified: Secondary | ICD-10-CM

## 2013-09-04 DIAGNOSIS — J9620 Acute and chronic respiratory failure, unspecified whether with hypoxia or hypercapnia: Secondary | ICD-10-CM | POA: Diagnosis present

## 2013-09-04 DIAGNOSIS — IMO0001 Reserved for inherently not codable concepts without codable children: Secondary | ICD-10-CM | POA: Diagnosis present

## 2013-09-04 DIAGNOSIS — N183 Chronic kidney disease, stage 3 unspecified: Secondary | ICD-10-CM

## 2013-09-04 DIAGNOSIS — G629 Polyneuropathy, unspecified: Secondary | ICD-10-CM | POA: Insufficient documentation

## 2013-09-04 DIAGNOSIS — G4730 Sleep apnea, unspecified: Secondary | ICD-10-CM | POA: Diagnosis present

## 2013-09-04 DIAGNOSIS — I5022 Chronic systolic (congestive) heart failure: Secondary | ICD-10-CM

## 2013-09-04 DIAGNOSIS — I2510 Atherosclerotic heart disease of native coronary artery without angina pectoris: Secondary | ICD-10-CM | POA: Diagnosis present

## 2013-09-04 DIAGNOSIS — E119 Type 2 diabetes mellitus without complications: Secondary | ICD-10-CM

## 2013-09-04 DIAGNOSIS — F1910 Other psychoactive substance abuse, uncomplicated: Secondary | ICD-10-CM

## 2013-09-04 DIAGNOSIS — J4489 Other specified chronic obstructive pulmonary disease: Secondary | ICD-10-CM | POA: Diagnosis present

## 2013-09-04 DIAGNOSIS — R0600 Dyspnea, unspecified: Secondary | ICD-10-CM

## 2013-09-04 DIAGNOSIS — Z794 Long term (current) use of insulin: Secondary | ICD-10-CM

## 2013-09-04 DIAGNOSIS — G4733 Obstructive sleep apnea (adult) (pediatric): Secondary | ICD-10-CM

## 2013-09-04 DIAGNOSIS — E876 Hypokalemia: Secondary | ICD-10-CM

## 2013-09-04 DIAGNOSIS — R079 Chest pain, unspecified: Secondary | ICD-10-CM

## 2013-09-04 DIAGNOSIS — Z7901 Long term (current) use of anticoagulants: Secondary | ICD-10-CM

## 2013-09-04 DIAGNOSIS — I129 Hypertensive chronic kidney disease with stage 1 through stage 4 chronic kidney disease, or unspecified chronic kidney disease: Secondary | ICD-10-CM | POA: Diagnosis present

## 2013-09-04 DIAGNOSIS — I5033 Acute on chronic diastolic (congestive) heart failure: Secondary | ICD-10-CM

## 2013-09-04 DIAGNOSIS — Z79899 Other long term (current) drug therapy: Secondary | ICD-10-CM

## 2013-09-04 DIAGNOSIS — G589 Mononeuropathy, unspecified: Secondary | ICD-10-CM | POA: Diagnosis present

## 2013-09-04 DIAGNOSIS — I4891 Unspecified atrial fibrillation: Secondary | ICD-10-CM

## 2013-09-04 HISTORY — DX: Weakness: R53.1

## 2013-09-04 HISTORY — DX: Cerebral infarction, unspecified: I63.9

## 2013-09-04 LAB — URINALYSIS, ROUTINE W REFLEX MICROSCOPIC
Glucose, UA: NEGATIVE mg/dL
Leukocytes, UA: NEGATIVE
Protein, ur: NEGATIVE mg/dL
Specific Gravity, Urine: 1.011 (ref 1.005–1.030)
Urobilinogen, UA: 0.2 mg/dL (ref 0.0–1.0)

## 2013-09-04 LAB — CBC
Hemoglobin: 15.8 g/dL (ref 13.0–17.0)
MCH: 27.1 pg (ref 26.0–34.0)
Platelets: 99 10*3/uL — ABNORMAL LOW (ref 150–400)
RBC: 5.84 MIL/uL — ABNORMAL HIGH (ref 4.22–5.81)

## 2013-09-04 LAB — BASIC METABOLIC PANEL
CO2: 24 mEq/L (ref 19–32)
Calcium: 9.9 mg/dL (ref 8.4–10.5)
GFR calc non Af Amer: 69 mL/min — ABNORMAL LOW (ref >90)
Glucose, Bld: 88 mg/dL (ref 70–99)
Potassium: 4.5 mEq/L (ref 3.5–5.1)
Sodium: 138 mEq/L (ref 135–145)

## 2013-09-04 LAB — TROPONIN I: Troponin I: <0.3 ng/mL (ref <0.30)

## 2013-09-04 LAB — GLUCOSE, CAPILLARY: Glucose-Capillary: 353 mg/dL — ABNORMAL HIGH (ref 70–99)

## 2013-09-04 LAB — MRSA PCR SCREENING: MRSA by PCR: NEGATIVE

## 2013-09-04 LAB — PRO B NATRIURETIC PEPTIDE: Pro B Natriuretic peptide (BNP): 5685 pg/mL — ABNORMAL HIGH (ref 0–125)

## 2013-09-04 LAB — PROTIME-INR: Prothrombin Time: 13.9 seconds (ref 11.6–15.2)

## 2013-09-04 MED ORDER — ONDANSETRON HCL 4 MG/2ML IJ SOLN: 4.0000 mg | Freq: Four times a day (QID) | INTRAMUSCULAR | Status: DC | PRN

## 2013-09-04 MED ORDER — CARVEDILOL 25 MG PO TABS
25.0000 mg | ORAL_TABLET | Freq: Two times a day (BID) | ORAL | Status: DC
  Administered 2013-09-04 – 2013-09-09 (×10): 25 mg via ORAL
  Filled 2013-09-04 (×12): qty 1

## 2013-09-04 MED ORDER — INSULIN ASPART 100 UNIT/ML ~~LOC~~ SOLN
0.0000 [IU] | Freq: Three times a day (TID) | SUBCUTANEOUS | Status: DC
  Administered 2013-09-04: 16:00:00 9 [IU] via SUBCUTANEOUS
  Administered 2013-09-05 (×3): 2 [IU] via SUBCUTANEOUS
  Administered 2013-09-06 (×2): 1 [IU] via SUBCUTANEOUS
  Administered 2013-09-06: 12:00:00 3 [IU] via SUBCUTANEOUS
  Administered 2013-09-07: 17:00:00 2 [IU] via SUBCUTANEOUS
  Administered 2013-09-08: 1 [IU] via SUBCUTANEOUS
  Administered 2013-09-08: 3 [IU] via SUBCUTANEOUS
  Administered 2013-09-09: 12:00:00 via SUBCUTANEOUS
  Administered 2013-09-09: 07:00:00 2 [IU] via SUBCUTANEOUS

## 2013-09-04 MED ORDER — DABIGATRAN ETEXILATE MESYLATE 75 MG PO CAPS
75.0000 mg | ORAL_CAPSULE | Freq: Two times a day (BID) | ORAL | Status: DC
  Administered 2013-09-04 – 2013-09-06 (×4): 75 mg via ORAL
  Filled 2013-09-04 (×5): qty 1

## 2013-09-04 MED ORDER — OXYCODONE-ACETAMINOPHEN 5-325 MG PO TABS
1.0000 | ORAL_TABLET | Freq: Three times a day (TID) | ORAL | Status: DC | PRN
  Administered 2013-09-08: 1 via ORAL
  Filled 2013-09-04: qty 1

## 2013-09-04 MED ORDER — FUROSEMIDE 10 MG/ML IJ SOLN
40.0000 mg | Freq: Once | INTRAMUSCULAR | Status: AC
  Administered 2013-09-04: 40 mg via INTRAVENOUS
  Filled 2013-09-04: qty 4

## 2013-09-04 MED ORDER — ISOSORB DINITRATE-HYDRALAZINE 20-37.5 MG PO TABS
1.0000 | ORAL_TABLET | Freq: Two times a day (BID) | ORAL | Status: DC
  Administered 2013-09-04 – 2013-09-06 (×5): 1 via ORAL
  Filled 2013-09-04 (×6): qty 1

## 2013-09-04 MED ORDER — POTASSIUM CHLORIDE CRYS ER 20 MEQ PO TBCR
40.0000 meq | EXTENDED_RELEASE_TABLET | Freq: Every day | ORAL | Status: DC
  Administered 2013-09-05 – 2013-09-06 (×2): 40 meq via ORAL
  Filled 2013-09-04 (×2): qty 2

## 2013-09-04 MED ORDER — PREDNISONE 20 MG PO TABS
40.0000 mg | ORAL_TABLET | Freq: Every day | ORAL | Status: DC
  Administered 2013-09-04 – 2013-09-06 (×3): 40 mg via ORAL
  Filled 2013-09-04 (×4): qty 2

## 2013-09-04 MED ORDER — ISOSORBIDE DINITRATE 20 MG PO TABS
20.0000 mg | ORAL_TABLET | Freq: Every day | ORAL | Status: DC
  Filled 2013-09-04: qty 1

## 2013-09-04 MED ORDER — SODIUM CHLORIDE 0.9 % IV SOLN
250.0000 mL | INTRAVENOUS | Status: DC | PRN
  Administered 2013-09-04: 250 mL via INTRAVENOUS

## 2013-09-04 MED ORDER — FUROSEMIDE 10 MG/ML IJ SOLN
40.0000 mg | Freq: Two times a day (BID) | INTRAMUSCULAR | Status: DC
  Administered 2013-09-04 – 2013-09-06 (×4): 40 mg via INTRAVENOUS
  Filled 2013-09-04 (×6): qty 4

## 2013-09-04 MED ORDER — SODIUM CHLORIDE 0.9 % IJ SOLN
3.0000 mL | Freq: Two times a day (BID) | INTRAMUSCULAR | Status: DC
  Administered 2013-09-04: 3 mL via INTRAVENOUS
  Administered 2013-09-04: 22:00:00 via INTRAVENOUS
  Administered 2013-09-05 – 2013-09-09 (×9): 3 mL via INTRAVENOUS

## 2013-09-04 MED ORDER — LORATADINE 10 MG PO TABS
10.0000 mg | ORAL_TABLET | Freq: Every day | ORAL | Status: DC | PRN
  Filled 2013-09-04: qty 1

## 2013-09-04 MED ORDER — FLUTICASONE PROPIONATE 50 MCG/ACT NA SUSP
2.0000 | Freq: Every day | NASAL | Status: DC
  Administered 2013-09-05 – 2013-09-09 (×5): 2 via NASAL
  Filled 2013-09-04: qty 16

## 2013-09-04 MED ORDER — SODIUM CHLORIDE 0.9 % IJ SOLN: 3.0000 mL | INTRAMUSCULAR | Status: DC | PRN

## 2013-09-04 MED ORDER — ALLOPURINOL 100 MG PO TABS
100.0000 mg | ORAL_TABLET | Freq: Every day | ORAL | Status: DC
  Administered 2013-09-04 – 2013-09-09 (×6): 100 mg via ORAL
  Filled 2013-09-04 (×6): qty 1

## 2013-09-04 MED ORDER — LEVALBUTEROL HCL 0.63 MG/3ML IN NEBU: 0.6300 mg | INHALATION_SOLUTION | Freq: Four times a day (QID) | RESPIRATORY_TRACT | Status: DC | PRN

## 2013-09-04 MED ORDER — NITROGLYCERIN 0.4 MG SL SUBL: 0.4000 mg | SUBLINGUAL_TABLET | SUBLINGUAL | Status: DC | PRN

## 2013-09-04 MED ORDER — NITROGLYCERIN 2 % TD OINT
0.5000 [in_us] | TOPICAL_OINTMENT | Freq: Once | TRANSDERMAL | Status: AC
  Administered 2013-09-04: 0.5 [in_us] via TOPICAL
  Filled 2013-09-04: qty 1

## 2013-09-04 MED ORDER — SENNOSIDES-DOCUSATE SODIUM 8.6-50 MG PO TABS
2.0000 | ORAL_TABLET | Freq: Every day | ORAL | Status: DC
  Administered 2013-09-04 – 2013-09-09 (×6): 2 via ORAL
  Filled 2013-09-04 (×6): qty 2

## 2013-09-04 MED ORDER — ACETAMINOPHEN 325 MG PO TABS: 650.0000 mg | ORAL_TABLET | ORAL | Status: DC | PRN

## 2013-09-04 MED ORDER — LEVALBUTEROL HCL 0.63 MG/3ML IN NEBU
0.6300 mg | INHALATION_SOLUTION | Freq: Four times a day (QID) | RESPIRATORY_TRACT | Status: DC
  Administered 2013-09-04: 15:00:00 0.63 mg via RESPIRATORY_TRACT
  Filled 2013-09-04: qty 3

## 2013-09-04 MED ORDER — INFLUENZA VAC SPLIT QUAD 0.5 ML IM SUSP
0.5000 mL | INTRAMUSCULAR | Status: AC
  Administered 2013-09-05: 0.5 mL via INTRAMUSCULAR
  Filled 2013-09-04: qty 0.5

## 2013-09-04 NOTE — ED Notes (Signed)
From [facility] home - pt c/o worsening SOB x 7 days & BLE edema x 2 days. Pt arrived on CPAP per EMS. Also given solumedrol 125mg , albuterol 10mg  & atrovent 0.5mg  neb enroute to ED

## 2013-09-04 NOTE — Progress Notes (Signed)
Pt taken off Bipap per MD order. Pt comfortable and talking in full sentences with no SOB or desat noted. RT will continue to monitor.

## 2013-09-04 NOTE — Consult Note (Signed)
Admit date: 09/04/2013 Referring Physician  Dr. Jerral Ralph Primary Physician  Dr. 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  He says that for the past week he has had progressive DOE.  He told the facility that he resides at but he says that they only gave him mylanta.  He denies any chest pain or pressure.  He has noticed increased LE edema with oozing of fluid in his LE.  He saw Dr. Katrinka Blazing 2 weeks ago and was doing well but about 8 days ago he started to decompensate.  He has 2 pillow orthopnea but no PND.  He denies any palpitations, dizziness or syncope.     PMH:   Past Medical History  Diagnosis Date  . Fibromyalgia   . Atrial fibrillation   . Hypertension   . Osteoarthritis   . COPD (chronic obstructive pulmonary disease)   . Sleep apnea with use of continuous positive airway pressure (CPAP)   . Diabetes mellitus without complication   . Coronary artery disease   . Neuropathy   . Ocular herpes   . Gout   . CKD (chronic kidney disease), stage III   . CHF (congestive heart failure)     EF 15%  . Polysubstance abuse     Hx of  . Chronic pain   . Hyperlipidemia   . Nonischemic cardiomyopathy   . Stroke   . Ocular herpes   . Neuropathy   . Left-sided weakness      PSH:   Past Surgical History  Procedure Laterality Date  . 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 Cardiac catheterization  10/2006    normal coronary arteries    Allergies:  Actos Prior to Admit Meds:   Prescriptions prior to admission  Medication Sig Dispense Refill  . allopurinol (ZYLOPRIM) 100 MG tablet Take 100 mg by mouth daily.      Marland Kitchen alum & mag hydroxide-simeth (MAALOX/MYLANTA) 200-200-20 MG/5ML suspension Take 30 mLs by mouth every 4 (four) hours as needed for indigestion.       . carvedilol  (COREG) 25 MG tablet Take 25 mg by mouth 2 (two) times daily with a meal.      . dabigatran (PRADAXA) 75 MG CAPS capsule Take 75 mg by mouth every 12 (twelve) hours.      . fluticasone (FLONASE) 50 MCG/ACT nasal spray Place 2 sprays into the nose daily. For 21 days starting 08/29/13      . furosemide (LASIX) 40 MG tablet Take 40 mg by mouth 2 (two) times daily.      Marland Kitchen glipiZIDE (GLUCOTROL) 5 MG tablet Take 5 mg by mouth 2 (two) times daily before a meal.      . insulin aspart (NOVOLOG) 100 UNIT/ML injection Inject 0-8 Units into the skin 3 (three) times daily with meals. Sliding scale: <200=0 units, 201-250=2 units, 251-300=4 units, 301-350=6 units, 351-400=8 units, >400=CALL MD      . isosorbide dinitrate (ISORDIL) 20 MG tablet Take 20 mg by mouth daily.      Marland Kitchen levalbuterol (XOPENEX) 0.63 MG/3ML nebulizer solution Take 3 mL (0.63 mg total) by nebulization every 6 (six) hours as needed for wheezing or shortness of breath.  3 mL  12  . loratadine (CLARITIN) 10 MG tablet  Take 10 mg by mouth daily as needed for allergies.      . nitroGLYCERIN (NITROSTAT) 0.4 MG SL tablet Place 0.4 mg under the tongue every 5 (five) minutes as needed for chest pain.      Marland Kitchen oxyCODONE-acetaminophen (PERCOCET/ROXICET) 5-325 MG per tablet Take 1 tablet by mouth every 8 (eight) hours as needed for pain.      . potassium chloride SA (K-DUR,KLOR-CON) 20 MEQ tablet Take 2 tablets (40 mEq total) by mouth daily.  30 tablet  0  . predniSONE (DELTASONE) 20 MG tablet Take 2 tablets (40 mg total) by mouth daily with breakfast.  2 tablet  0  . senna-docusate (SENOKOT-S) 8.6-50 MG per tablet Take 2 tablets by mouth daily.       Fam HX:   No family history on file. Social HX:    History   Social History  . Marital Status: Divorced    Spouse Name: N/A    Number of Children: N/A  . Years of Education: N/A   Occupational History  . Not on file.   Social History Main Topics  . Smoking status: Never Smoker   . Smokeless tobacco:  Not on file  . Alcohol Use: No  . Drug Use: No  . Sexual Activity: Not on file   Other Topics Concern  . Not on file   Social History Narrative  . No narrative on file     ROS:  All 11 ROS were addressed and are negative except what is stated in the HPI  Physical Exam: Blood pressure 137/92, pulse 88, temperature 97.6 F (36.4 C), temperature source Oral, resp. rate 20, height 5\' 10"  (1.778 m), weight 253 lb 12 oz (115.1 kg), SpO2 98.00%.    General: Well developed, well nourished, in no acute distress Head: Eyes PERRLA, No xanthomas.   Normal cephalic and atramatic  Lungs:   Decreased BS at bases with crackles Heart:   HRRR S1 S2 Pulses are 2+ & equal.            No carotid bruit. No JVD.  No abdominal bruits. No femoral bruits. Abdomen: Bowel sounds are positive, abdomen soft and non-tender without masses Extremities:   2+ edema bilaterally Neuro: Alert and oriented X 3. Psych:  Good affect, responds appropriately    Labs:   Lab Results  Component Value Date   WBC 12.1* 09/04/2013   HGB 15.8 09/04/2013   HCT 46.9 09/04/2013   MCV 80.3 09/04/2013   PLT 99* 09/04/2013    Recent Labs Lab 09/04/13 0955  NA 138  K 4.5  CL 101  CO2 24  BUN 32*  CREATININE 1.11  CALCIUM 9.9  GLUCOSE 88   No results found for this basename: PTT   Lab Results  Component Value Date   INR 2.02* 07/09/2013   INR 1.52* 07/08/2013   INR 1.26 07/07/2013   Lab Results  Component Value Date   TROPONINI <0.30 07/03/2013     Lab Results  Component Value Date   CHOL 95 07/04/2013   Lab Results  Component Value Date   HDL 27* 07/04/2013   Lab Results  Component Value Date   LDLCALC 55 07/04/2013   Lab Results  Component Value Date   TRIG 66 07/04/2013   Lab Results  Component Value Date   CHOLHDL 3.5 07/04/2013   No results found for this basename: LDLDIRECT      Radiology:  Dg Chest Portable 1 View  09/04/2013   CLINICAL  DATA:  Shortness of breath for 1 week  EXAM:  PORTABLE CHEST - 1 VIEW  COMPARISON:  Chest x-ray of 07/03/2013  FINDINGS: Moderate cardiomegaly is stable. No active infiltrate or effusion is seen. Mediastinal contours are stable. There are diffuse degenerative changes throughout the thoracic spine.  IMPRESSION: Stable moderate cardiomegaly. No active lung disease   Electronically Signed   By: Dwyane Dee M.D.   On: 09/04/2013 09:25    EKG:  NSR  ASSESSMENT:  1.  Acute on chronic systolic CHF - BNP elevated at 5685 2.  CAD with no angina 3.  HTN - mild diastolic HTN at present 4.  PAF 5.  CKD stage III 6.  Nonischemic dilated CM  PLAN:   1.  Continue Coreg 2.  IV diuresis 3.  Change Imdur to Bidil for after load/pre load reduction 4.  Further workup per Dr. Flossie Dibble, MD  09/04/2013  1:27 PM

## 2013-09-04 NOTE — Progress Notes (Signed)
Patient has arrived from the ED, report received from ED nurse and patient is stable; will continue to monitor patient. Lorretta Harp RN

## 2013-09-04 NOTE — ED Notes (Signed)
Pt taken off Bipap. Will monitor. Denies SOB presently, resp e/u, no distress

## 2013-09-04 NOTE — Progress Notes (Signed)
Heart failure education has been given and reviewed with patient, the patient has no question at this time and verbalizes understanding of education; will continue to monitor patient. Lorretta Harp RN

## 2013-09-04 NOTE — ED Provider Notes (Signed)
CSN: [id number]     Arrival date & time 09/04/13  0845 History   First MD Initiated Contact with Patient 09/04/13 [phone]     Chief Complaint  Patient presents with  . Shortness of Breath   (Consider location/radiation/quality/duration/timing/severity/associated sxs/prior Treatment) Patient is a 62 y.o. male presenting with shortness of breath.  Shortness of Breath Severity:  Moderate Onset quality:  Gradual Duration:  8 days Timing:  Constant Progression:  Worsening Chronicity:  Recurrent Context: not URI   Relieved by:  Position changes and sitting up Exacerbated by: laying flat. Ineffective treatments:  None tried Associated symptoms: cough (nonproductive) and wheezing   Associated symptoms: no abdominal pain, no chest pain, no fever, no headaches, no rash, no sore throat and no vomiting     Past Medical History  Diagnosis Date  . Fibromyalgia   . Atrial fibrillation   . Hypertension   . Osteoarthritis   . COPD (chronic obstructive pulmonary disease)   . Sleep apnea with use of continuous positive airway pressure (CPAP)   . Diabetes mellitus without complication   . Coronary artery disease   . Neuropathy   . Ocular herpes   . Gout   . CKD (chronic kidney disease), stage III   . CHF (congestive heart failure)     EF 15%  . Polysubstance abuse     Hx of  . Chronic pain   . Hyperlipidemia   . Nonischemic cardiomyopathy   . Stroke   . Ocular herpes   . Neuropathy   . Left-sided weakness    Past Surgical History  Procedure Laterality Date  . 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 Cardiac catheterization  10/2006    normal coronary arteries   No family history on file. History  Substance Use Topics  . Smoking status: Never Smoker   . Smokeless tobacco: Not on file  . Alcohol Use: No    Review of Systems  Constitutional: Negative for fever and chills.  HENT: Negative for congestion, rhinorrhea and sore throat.   Eyes: Negative for photophobia and visual  disturbance.  Respiratory: Positive for cough (nonproductive), shortness of breath and wheezing.   Cardiovascular: Negative for chest pain and leg swelling.  Gastrointestinal: Negative for nausea, vomiting, abdominal pain, diarrhea and constipation.  Endocrine: Negative for polydipsia and polyuria.  Genitourinary: Negative for dysuria and hematuria.  Musculoskeletal: Negative for arthralgias and back pain.  Skin: Negative for color change and rash.  Neurological: Negative for dizziness, syncope, light-headedness and headaches.  Hematological: Negative for adenopathy. Does not bruise/bleed easily.  All other systems reviewed and are negative.    Allergies  Actos  Home Medications   Current Outpatient Rx  Name  Route  Sig  Dispense  Refill  . allopurinol (ZYLOPRIM) 100 MG tablet   Oral   Take 100 mg by mouth daily.         Marland Kitchen alum & mag hydroxide-simeth (MAALOX/MYLANTA) 200-200-20 MG/5ML suspension   Oral   Take 30 mLs by mouth every 4 (four) hours as needed for indigestion.          . carvedilol (COREG) 25 MG tablet   Oral   Take 25 mg by mouth 2 (two) times daily with a meal.         . dabigatran (PRADAXA) 75 MG CAPS capsule   Oral   Take 75 mg by mouth every 12 (twelve) hours.         . fluticasone (FLONASE) 50  MCG/ACT nasal spray   Nasal   Place 2 sprays into the nose daily. For 21 days starting 08/29/13         . furosemide (LASIX) 40 MG tablet   Oral   Take 40 mg by mouth 2 (two) times daily.         Marland Kitchen glipiZIDE (GLUCOTROL) 5 MG tablet   Oral   Take 5 mg by mouth 2 (two) times daily before a meal.         . insulin aspart (NOVOLOG) 100 UNIT/ML injection   Subcutaneous   Inject 0-8 Units into the skin 3 (three) times daily with meals. Sliding scale: <200=0 units, 201-250=2 units, 251-300=4 units, 301-350=6 units, 351-400=8 units, >400=CALL MD         . isosorbide dinitrate (ISORDIL) 20 MG tablet   Oral   Take 20 mg by mouth daily.         Marland Kitchen  levalbuterol (XOPENEX) 0.63 MG/3ML nebulizer solution   Nebulization   Take 3 mL (0.63 mg total) by nebulization every 6 (six) hours as needed for wheezing or shortness of breath.   3 mL   12   . loratadine (CLARITIN) 10 MG tablet   Oral   Take 10 mg by mouth daily as needed for allergies.         . nitroGLYCERIN (NITROSTAT) 0.4 MG SL tablet   Sublingual   Place 0.4 mg under the tongue every 5 (five) minutes as needed for chest pain.         Marland Kitchen oxyCODONE-acetaminophen (PERCOCET/ROXICET) 5-325 MG per tablet   Oral   Take 1 tablet by mouth every 8 (eight) hours as needed for pain.         . potassium chloride SA (K-DUR,KLOR-CON) 20 MEQ tablet   Oral   Take 2 tablets (40 mEq total) by mouth daily.   30 tablet   0   . predniSONE (DELTASONE) 20 MG tablet   Oral   Take 2 tablets (40 mg total) by mouth daily with breakfast.   2 tablet   0   . senna-docusate (SENOKOT-S) 8.6-50 MG per tablet   Oral   Take 2 tablets by mouth daily.          BP 122/92  Pulse 60  Temp(Src) 97.6 F (36.4 C) (Oral)  Resp 14  Ht 5\' 10"  (1.778 m)  Wt 235 lb (106.595 kg)  BMI 33.72 kg/m2  SpO2 100% Physical Exam  Vitals reviewed. Constitutional: He is oriented to person, place, and time. He appears well-developed and well-nourished.  HENT:  Head: Normocephalic and atraumatic.  Eyes: Conjunctivae and EOM are normal.  Neck: Normal range of motion. Neck supple.  Cardiovascular: Normal rate, regular rhythm and normal heart sounds.   Pulmonary/Chest: Effort normal. No respiratory distress. He has decreased breath sounds (bilaterally). He has wheezes (bilaterally).  Abdominal: He exhibits no distension. There is no tenderness. There is no rebound and no guarding.  Musculoskeletal: Normal range of motion.       Right lower leg: He exhibits edema.       Left lower leg: He exhibits edema.  Neurological: He is alert and oriented to person, place, and time.  Skin: Skin is warm and dry.    ED  Course  Procedures (including critical care time) Labs Review Labs Reviewed  CBC - Abnormal; Notable for the following:    WBC 12.1 (*)    RBC 5.84 (*)    RDW 22.8 (*)  Platelets 99 (*)    All other components within normal limits  BASIC METABOLIC PANEL - Abnormal; Notable for the following:    BUN 32 (*)    GFR calc non Af Amer 69 (*)    GFR calc Af Amer 80 (*)    All other components within normal limits  PRO B NATRIURETIC PEPTIDE - Abnormal; Notable for the following:    Pro B Natriuretic peptide (BNP) 5685.0 (*)    All other components within normal limits  URINALYSIS, ROUTINE W REFLEX MICROSCOPIC  PROTIME-INR   Imaging Review Dg Chest Portable 1 View  09/04/2013   CLINICAL DATA:  Shortness of breath for 1 week  EXAM: PORTABLE CHEST - 1 VIEW  COMPARISON:  Chest x-ray of 07/03/2013  FINDINGS: Moderate cardiomegaly is stable. No active infiltrate or effusion is seen. Mediastinal contours are stable. There are diffuse degenerative changes throughout the thoracic spine.  IMPRESSION: Stable moderate cardiomegaly. No active lung disease   Electronically Signed   By: Dwyane Dee M.D.   On: 09/04/2013 09:25    EKG Interpretation   None       Date: 09/04/2013  Rate: 90  Rhythm: atrial fibrillation  QRS Axis: normal  Intervals: afib, prolonged QTC at 528  ST/T Wave abnormalities: nonspecific T wave changes  Conduction Disutrbances:none  Narrative Interpretation: afib with new T wave inversions in lateral leads  Old EKG Reviewed: changes noted   MDM   1. Acute exacerbation of CHF (congestive heart failure)   2. Dyspnea    62 y.o. male  with pertinent PMH of CHF, COPD, recent CVA, CKD presents with shortness of breath x8 days. Patient denies fever productive cough or other infectious symptoms, however has complained of some increased nonproductive cough. He is currently a member of the skilled nursing facility after his recent CVA within the last 2 months. He denies any  neurologic complaints. Patient states that throughout the last 8 days he's had increased shortness of breath with some chest pressure however no frank chest pain. On EMS arrival patient with O2 sat and high 80 percent range.  Patient was given albuterol and Solu-Medrol with minimal effect however didn't was placed on CPAP with improvement in respiratory effort and symptoms. This included release of chest pressure. On arrival to ED vitals as above. Physical exam demonstrated diffuse wheezing and restriction. No Rales appreciated, however patient did have 3+ edema of bilateral lower extremities. EKG as above with new T wave inversions.  Labs as above with elevation in BNP.  Nitro paste applied.  Breathing improved to nonlabored on 2L .  Consulted hospitalist and pt admitted for CHF wu.   Labs and imaging as above reviewed by myself and attending,Dr. Jeraldine Loots, with whom case was discussed.   1. Acute exacerbation of CHF (congestive heart failure)   2. Dyspnea         Noel Gerold, MD 09/04/13 1225

## 2013-09-04 NOTE — ED Notes (Signed)
Pt reports breathing improved with CPAP & neb given by EMS. Denies fever, chills, CP

## 2013-09-04 NOTE — H&P (Signed)
PATIENT DETAILS Name: Frank Davidson Age: 62 y.o. Sex: male Date of Birth: May 28, 1951 Admit Date: 09/04/2013 ZOX:WRUEAV,WUJWJX, MD   CHIEF COMPLAINT:  Shortness of breath for 5 days  HPI: Frank Davidson is a 62 y.o. male with a Past Medical History of atrial fibrillation on Pradaxa, COPD, hypertension, nonischemic cardiomyopathy with an EF around 15%, CVA who presents today with the above noted complaint. Per patient, for the past 5 days he is to have shortness of breath, this is mostly due to exertion. He also gives a history of orthopnea, and claims that he started using 2 pillows over the past few days. He claims he is gained around 10 pounds over the past week. He also claims to have worsening lower extremity edema. He was brought into the emergency room, found to have acute congestive heart failure, I was asked to admit this patient for further evaluation and treatment. Patient also claims to have some mild chest discomfort for the past 2 days, this isn't associated nausea, vomiting or diaphoresis.   ALLERGIES:   Allergies  Allergen Reactions  . Actos [Pioglitazone] Other (See Comments)    Caused blood in urine and fluid retention     PAST MEDICAL HISTORY: Past Medical History  Diagnosis Date  . Fibromyalgia   . Atrial fibrillation   . Hypertension   . Osteoarthritis   . COPD (chronic obstructive pulmonary disease)   . Sleep apnea with use of continuous positive airway pressure (CPAP)   . Diabetes mellitus without complication   . Coronary artery disease   . Neuropathy   . Ocular herpes   . Gout   . CKD (chronic kidney disease), stage III   . CHF (congestive heart failure)     EF 15%  . Polysubstance abuse     Hx of  . Chronic pain   . Hyperlipidemia   . Nonischemic cardiomyopathy   . Stroke   . Ocular herpes   . Neuropathy   . Left-sided weakness     PAST SURGICAL HISTORY: Past Surgical History  Procedure Laterality Date  . Orchiectomy Left     as a  child  . Cardiac catheterization  10/2006    normal coronary arteries    MEDICATIONS AT HOME: Prior to Admission medications   Medication Sig Start Date End Date Taking? Authorizing Provider  allopurinol (ZYLOPRIM) 100 MG tablet Take 100 mg by mouth daily.   Yes Historical Provider, MD  alum & mag hydroxide-simeth (MAALOX/MYLANTA) 200-200-20 MG/5ML suspension Take 30 mLs by mouth every 4 (four) hours as needed for indigestion.    Yes Historical Provider, MD  carvedilol (COREG) 25 MG tablet Take 25 mg by mouth 2 (two) times daily with a meal.   Yes Historical Provider, MD  dabigatran (PRADAXA) 75 MG CAPS capsule Take 75 mg by mouth every 12 (twelve) hours.   Yes Historical Provider, MD  fluticasone (FLONASE) 50 MCG/ACT nasal spray Place 2 sprays into the nose daily. For 21 days starting 08/29/13   Yes Historical Provider, MD  furosemide (LASIX) 40 MG tablet Take 40 mg by mouth 2 (two) times daily.   Yes Historical Provider, MD  glipiZIDE (GLUCOTROL) 5 MG tablet Take 5 mg by mouth 2 (two) times daily before a meal.   Yes Historical Provider, MD  insulin aspart (NOVOLOG) 100 UNIT/ML injection Inject 0-8 Units into the skin 3 (three) times daily with meals. Sliding scale: <200=0 units, 201-250=2 units, 251-300=4 units, 301-350=6 units, 351-400=8 units, >400=CALL MD   Yes Historical Provider,  MD  isosorbide dinitrate (ISORDIL) 20 MG tablet Take 20 mg by mouth daily.   Yes Historical Provider, MD  levalbuterol (XOPENEX) 0.63 MG/3ML nebulizer solution Take 3 mL (0.63 mg total) by nebulization every 6 (six) hours as needed for wheezing or shortness of breath. 07/09/13  Yes Nishant Dhungel, MD  loratadine (CLARITIN) 10 MG tablet Take 10 mg by mouth daily as needed for allergies.   Yes Historical Provider, MD  nitroGLYCERIN (NITROSTAT) 0.4 MG SL tablet Place 0.4 mg under the tongue every 5 (five) minutes as needed for chest pain.   Yes Historical Provider, MD  oxyCODONE-acetaminophen (PERCOCET/ROXICET) 5-325  MG per tablet Take 1 tablet by mouth every 8 (eight) hours as needed for pain.   Yes Historical Provider, MD  potassium chloride SA (K-DUR,KLOR-CON) 20 MEQ tablet Take 2 tablets (40 mEq total) by mouth daily. 07/09/13  Yes Nishant Dhungel, MD  predniSONE (DELTASONE) 20 MG tablet Take 2 tablets (40 mg total) by mouth daily with breakfast. 07/09/13  Yes Nishant Dhungel, MD  senna-docusate (SENOKOT-S) 8.6-50 MG per tablet Take 2 tablets by mouth daily.   Yes Historical Provider, MD    FAMILY HISTORY: No family history on file.  SOCIAL HISTORY:  reports that he has never smoked. He does not have any smokeless tobacco history on file. He reports that he does not drink alcohol or use illicit drugs.  REVIEW OF SYSTEMS:  Constitutional:   No  weight loss, night sweats,  Fevers, chills, fatigue.  HEENT:    No headaches, Difficulty swallowing,Tooth/dental problems,Sore throat,  No sneezing, itching, ear ache, nasal congestion, post nasal drip,   Cardio-vascular: No anasarca, dizziness, palpitations  GI:  No heartburn, indigestion, abdominal pain, nausea, vomiting, diarrhea, change in  bowel habits, loss of appetite  Resp:  No excess mucus, no productive cough, No non-productive cough,  No coughing up of blood.No change in color of mucus.No wheezing.No chest wall deformity  Skin:  no rash or lesions.  GU:  no dysuria, change in color of urine, no urgency or frequency.  No flank pain.  Musculoskeletal: No joint pain or swelling.  No decreased range of motion.  No back pain.  Psych: No change in mood or affect. No depression or anxiety.  No memory loss.   PHYSICAL EXAM: Blood pressure 122/92, pulse 60, temperature 97.6 F (36.4 C), temperature source Oral, resp. rate 14, height 5\' 10"  (1.778 m), weight 106.595 kg (235 lb), SpO2 100.00%.  General appearance :Awake, alert, not in any distress. Speech Clear. Not toxic Looking HEENT: Atraumatic and Normocephalic, pupils equally reactive  to light and accomodation Neck: supple, no JVD. No cervical lymphadenopathy.  Chest:Good air entry bilaterally, a few by basilar rales CVS: S1 S2 regular, no murmurs.  Abdomen: Bowel sounds present, Non tender and not distended with no gaurding, rigidity or rebound. Extremities: B/L Lower Ext shows 2-3+ edema, both legs are warm to touch Neurology: Awake alert, and oriented X 3, CN II-XII intact, chronic left lower extremity weakness-around 3/5  Skin:No Rash Wounds:N/A  LABS ON ADMISSION:   Recent Labs  09/04/13 0955  NA 138  K 4.5  CL 101  CO2 24  GLUCOSE 88  BUN 32*  CREATININE 1.11  CALCIUM 9.9   No results found for this basename: AST, ALT, ALKPHOS, BILITOT, PROT, ALBUMIN,  in the last 72 hours No results found for this basename: LIPASE, AMYLASE,  in the last 72 hours  Recent Labs  09/04/13 0955  WBC 12.1*  HGB 15.8  HCT 46.9  MCV 80.3  PLT 99*   No results found for this basename: CKTOTAL, CKMB, CKMBINDEX, TROPONINI,  in the last 72 hours No results found for this basename: DDIMER,  in the last 72 hours No components found with this basename: POCBNP,    RADIOLOGIC STUDIES ON ADMISSION: Dg Chest Portable 1 View  09/04/2013   CLINICAL DATA:  Shortness of breath for 1 week  EXAM: PORTABLE CHEST - 1 VIEW  COMPARISON:  Chest x-ray of 07/03/2013  FINDINGS: Moderate cardiomegaly is stable. No active infiltrate or effusion is seen. Mediastinal contours are stable. There are diffuse degenerative changes throughout the thoracic spine.  IMPRESSION: Stable moderate cardiomegaly. No active lung disease   Electronically Signed   By: Dwyane Dee M.D.   On: 09/04/2013 09:25      ASSESSMENT AND PLAN: Present on Admission:  . Acute on chronic CHF - Will admit to telemetry, start intravenous Lasix 40 mg twice daily, continue Coreg.? Why not on ACE inhibitor, will defer this to patient's primary cardiology who we will be consulting during this hospital stay.  - Daily weights,  strict I&O is. Continue to monitor closely and follow clinical course   . Chest pain - Very atypical, we'll cycle cardiac enzymes. Hold off on starting aspirin, as an Pradaxa for paroxysmal atrial fibrillation.  - On beta blockers   . CKD (chronic kidney disease), stage III - Close to usual baseline, monitor while on intravenous Lasix   . COPD (chronic obstructive pulmonary disease) - Shortness of breath more from congestive heart failure other than COPD. Continue with steroids that the patient was already on prior to this admission, continue with nebulized bronchodilators.   . Diabetes mellitus without complication - Hold glipizide, start sliding scale insulin.   Marland Kitchen Hypertension - Currently controlled, continue Coreg, and Lasix.   . Nonischemic cardiomyopathy - As above   . Left-sided weakness - Recent CVA, chronic left lower extremity residual weakness. On Pradaxa for secondary prevention- Given history of seizure  Further plan will depend as patient's clinical course evolves and further radiologic and laboratory data become available. Patient will be monitored closely.   DVT Prophylaxis: Not needed as an Pradaxa  Code Status: Full Code  Total time spent for admission equals 45 minutes.  [facility] Pager [phone]  If 7PM-7AM, please contact night-coverage [email or web address] Password [email or web address] 09/04/2013, 11:57 AM

## 2013-09-04 NOTE — Progress Notes (Signed)
Cosign for Frank Davidson's assessment, I/O, medications, care plan/education and notes. D.Shana Younge RN 

## 2013-09-04 NOTE — Progress Notes (Signed)
Utilization Review Completed.   Trey Bebee, RN, BSN Nurse Case Manager  [phone]  

## 2013-09-05 ENCOUNTER — Encounter (HOSPITAL_COMMUNITY): Payer: Self-pay | Admitting: Interventional Cardiology

## 2013-09-05 DIAGNOSIS — I5023 Acute on chronic systolic (congestive) heart failure: Principal | ICD-10-CM

## 2013-09-05 DIAGNOSIS — E119 Type 2 diabetes mellitus without complications: Secondary | ICD-10-CM

## 2013-09-05 LAB — BASIC METABOLIC PANEL
CO2: 26 mEq/L (ref 19–32)
Chloride: 99 mEq/L (ref 96–112)
Creatinine, Ser: 1.24 mg/dL (ref 0.50–1.35)
Glucose, Bld: 203 mg/dL — ABNORMAL HIGH (ref 70–99)
Potassium: 4.7 mEq/L (ref 3.5–5.1)
Sodium: 140 mEq/L (ref 135–145)

## 2013-09-05 LAB — GLUCOSE, CAPILLARY
Glucose-Capillary: 179 mg/dL — ABNORMAL HIGH (ref 70–99)
Glucose-Capillary: 170 mg/dL — ABNORMAL HIGH (ref 70–99)

## 2013-09-05 LAB — TROPONIN I: Troponin I: <0.3 ng/mL (ref <0.30)

## 2013-09-05 MED ORDER — HYDRALAZINE HCL 20 MG/ML IJ SOLN: 10.0000 mg | Freq: Four times a day (QID) | INTRAMUSCULAR | Status: DC | PRN

## 2013-09-05 MED ORDER — DIGOXIN 250 MCG PO TABS
0.2500 mg | ORAL_TABLET | Freq: Every day | ORAL | Status: DC
  Administered 2013-09-05 – 2013-09-09 (×5): 0.25 mg via ORAL
  Filled 2013-09-05 (×6): qty 1

## 2013-09-05 MED ORDER — INSULIN ASPART 100 UNIT/ML ~~LOC~~ SOLN
6.0000 [IU] | Freq: Three times a day (TID) | SUBCUTANEOUS | Status: DC
  Administered 2013-09-05 – 2013-09-09 (×12): 6 [IU] via SUBCUTANEOUS

## 2013-09-05 NOTE — ED Provider Notes (Signed)
This patient was seen in conjunction with the resident physician, Dr. Littie Deeds. The documentation is accurate and reflects my interpretation, with the following additions:  On initial presentation the patient was in respiratory distress, receiving CPAP via EMS providers.  However, he improved substantially here, transitioned to minimal oxygen support. Patient's presentation was most consistent with CHF exacerbation, and after resuscitation here, with a substantial improvement, he did not require ICU admission, but was admitted for further evaluation and management. The patient EKG was most notable for new T-wave inversions, which is abnormal.  Gerhard Munch, MD 09/05/13 [phone]

## 2013-09-05 NOTE — Progress Notes (Addendum)
[facility] PROGRESS NOTE  Frank Davidson ZOX:[id number] DOB: 1951/09/25 DOA: 09/04/2013 PCP: Georgann Housekeeper, MD (from [facility] so not his regular patient)  Assessment/Plan: Acute on chronic systolic CHF - BNP elevated at 5685, coreg, cards following, IV Lasix, strict I/Os, daily weights  Acute on Chronic Respiratory Failure  CAD with no angina- coreg  HTN - coreg  PAF   CKD (chronic kidney disease), stage III  - Close to usual baseline, monitor while on intravenous Lasix   COPD (chronic obstructive pulmonary disease)  - Shortness of breath more from congestive heart failure other than COPD. Continue with steroids that the patient was already on prior to this admission, continue with nebulized bronchodilators.   Diabetes mellitus without complication  - Hold glipizide, start sliding scale insulin.   Nonischemic dilated CM   Code Status: full Family Communication: patient Disposition Plan: back to snf in AM   Consultants:  cards  Procedures:    Antibiotics:  HPI/Subjective: Breathing much better today  Objective: Filed Vitals:   09/05/13 0521  BP: 126/83  Pulse: 79  Temp: 98 F (36.7 C)  Resp: 20    Intake/Output Summary (Last 24 hours) at 09/05/13 0919 Last data filed at 09/05/13 0846  Gross per 24 hour  Intake    846 ml  Output   1879 ml  Net  -1033 ml   Filed Weights   09/04/13 0904 09/04/13 1252 09/05/13 0521  Weight: 106.595 kg (235 lb) 115.1 kg (253 lb 12 oz) 112.129 kg (247 lb 3.2 oz)    Exam:   General:  A+Ox3, NAD- not on O2  Cardiovascular: rrr  Respiratory: clear anterior, decreased  Abdomen: +BS, soft  Musculoskeletal: moves all 4 ext   Data Reviewed: Basic Metabolic Panel:  Recent Labs Lab 09/04/13 0955 09/05/13 0135  NA 138 140  K 4.5 4.7  CL 101 99  CO2 24 26  GLUCOSE 88 203*  BUN 32* 42*  CREATININE 1.11 1.24  CALCIUM 9.9 9.4   Liver Function Tests: No results found for this basename: AST, ALT,  ALKPHOS, BILITOT, PROT, ALBUMIN,  in the last 168 hours No results found for this basename: LIPASE, AMYLASE,  in the last 168 hours No results found for this basename: AMMONIA,  in the last 168 hours CBC:  Recent Labs Lab 09/04/13 0955  WBC 12.1*  HGB 15.8  HCT 46.9  MCV 80.3  PLT 99*   Cardiac Enzymes:  Recent Labs Lab 09/04/13 1410 09/04/13 1740 09/05/13 0135  TROPONINI <0.30 <0.30 <0.30   BNP (last 3 results)  Recent Labs  09/04/13 0955  PROBNP 5685.0*   CBG:  Recent Labs Lab 09/04/13 1256 09/04/13 1619 09/04/13 2129 09/05/13 0635  GLUCAP 189* 353* 229* 179*    Recent Results (from the past 240 hour(s))  MRSA PCR SCREENING     Status: None   Collection Time    09/04/13  1:11 PM      Result Value Range Status   MRSA by PCR NEGATIVE  NEGATIVE Final   Comment:            The GeneXpert MRSA Assay (FDA     approved for NASAL specimens     only), is one component of a     comprehensive MRSA colonization     surveillance program. It is not     intended to diagnose MRSA     infection nor to guide or     monitor treatment for     MRSA infections.  Studies: Dg Chest Portable 1 View  09/04/2013   CLINICAL DATA:  Shortness of breath for 1 week  EXAM: PORTABLE CHEST - 1 VIEW  COMPARISON:  Chest x-ray of 07/03/2013  FINDINGS: Moderate cardiomegaly is stable. No active infiltrate or effusion is seen. Mediastinal contours are stable. There are diffuse degenerative changes throughout the thoracic spine.  IMPRESSION: Stable moderate cardiomegaly. No active lung disease   Electronically Signed   By: Dwyane Dee M.D.   On: 09/04/2013 09:25    Scheduled Meds: . allopurinol  100 mg Oral Daily  . carvedilol  25 mg Oral BID WC  . dabigatran  75 mg Oral Q12H  . fluticasone  2 spray Each Nare Daily  . furosemide  40 mg Intravenous BID  . influenza vac split quadrivalent PF  0.5 mL Intramuscular Tomorrow-1000  . insulin aspart  0-9 Units Subcutaneous TID WC  .  isosorbide-hydrALAZINE  1 tablet Oral BID  . potassium chloride SA  40 mEq Oral Daily  . predniSONE  40 mg Oral Q breakfast  . senna-docusate  2 tablet Oral Daily  . sodium chloride  3 mL Intravenous Q12H   Continuous Infusions:   Active Problems:   Hypertension   COPD (chronic obstructive pulmonary disease)   Diabetes mellitus without complication   CKD (chronic kidney disease), stage III   Nonischemic cardiomyopathy   Left-sided weakness   Acute CHF   Chest pain    Time spent: 35 min    Jaelyne Deeg  [facility] Pager [phone]. If 7PM-7AM, please contact night-coverage at [email or web address], password [facility] 09/05/2013, 9:19 AM  LOS: 1 day

## 2013-09-05 NOTE — Progress Notes (Signed)
SUBJECTIVE:  SHOB improved.  Edema with weeping LE skin lesions still present.  OBJECTIVE:   Vitals:   Filed Vitals:   09/05/13 0521 09/05/13 1155 09/05/13 1355 09/05/13 1706  BP: 126/83 152/102 116/83 129/86  Pulse: 79 85 81 80  Temp: 98 F (36.7 C) 97.7 F (36.5 C) 98.6 F (37 C) 98 F (36.7 C)  TempSrc: Oral Oral Oral Oral  Resp: 20 20 20 18   Height:      Weight: 247 lb 3.2 oz (112.129 kg)     SpO2: 95% 98% 97% 97%   I&O's:   Intake/Output Summary (Last 24 hours) at 09/05/13 1741 Last data filed at 09/05/13 1355  Gross per 24 hour  Intake    726 ml  Output   1404 ml  Net   -678 ml   TELEMETRY: Reviewed telemetry pt in AFib:     PHYSICAL EXAM General: Well developed, well nourished, in no acute distress Head: Eyes PERRLA, No xanthomas.   Normal cephalic and atramatic  Lungs:   Clear bilaterally to auscultation and percussion. Heart: Irregularly irregular S1 S2 Pulses are 2+ & equal.            No carotid bruit. No JVD.   Abdomen: obese, abdomen soft and non-tender Msk:  Back normal, normal gait. Normal strength and tone for age. Extremities:  Bilateral LE edema.  Neuro: Alert and oriented X 3. Psych:  Good affect, responds appropriately   LABS: Basic Metabolic Panel:  Recent Labs  96/04/54 0955 09/05/13 0135  NA 138 140  K 4.5 4.7  CL 101 99  CO2 24 26  GLUCOSE 88 203*  BUN 32* 42*  CREATININE 1.11 1.24  CALCIUM 9.9 9.4   Liver Function Tests: No results found for this basename: AST, ALT, ALKPHOS, BILITOT, PROT, ALBUMIN,  in the last 72 hours No results found for this basename: LIPASE, AMYLASE,  in the last 72 hours CBC:  Recent Labs  09/04/13 0955  WBC 12.1*  HGB 15.8  HCT 46.9  MCV 80.3  PLT 99*   Cardiac Enzymes:  Recent Labs  09/04/13 1410 09/04/13 1740 09/05/13 0135  TROPONINI <0.30 <0.30 <0.30   BNP: No components found with this basename: POCBNP,  D-Dimer: No results found for this basename: DDIMER,  in the last 72  hours Hemoglobin A1C: No results found for this basename: HGBA1C,  in the last 72 hours Fasting Lipid Panel: No results found for this basename: CHOL, HDL, LDLCALC, TRIG, CHOLHDL, LDLDIRECT,  in the last 72 hours Thyroid Function Tests: No results found for this basename: TSH, T4TOTAL, FREET3, T3FREE, THYROIDAB,  in the last 72 hours Anemia Panel: No results found for this basename: VITAMINB12, FOLATE, FERRITIN, TIBC, IRON, RETICCTPCT,  in the last 72 hours Coag Panel:   Lab Results  Component Value Date   INR 1.09 09/04/2013   INR 2.02* 07/09/2013   INR 1.52* 07/08/2013    RADIOLOGY: Dg Chest Portable 1 View  09/04/2013   CLINICAL DATA:  Shortness of breath for 1 week  EXAM: PORTABLE CHEST - 1 VIEW  COMPARISON:  Chest x-ray of 07/03/2013  FINDINGS: Moderate cardiomegaly is stable. No active infiltrate or effusion is seen. Mediastinal contours are stable. There are diffuse degenerative changes throughout the thoracic spine.  IMPRESSION: Stable moderate cardiomegaly. No active lung disease   Electronically Signed   By: Dwyane Dee M.D.   On: 09/04/2013 09:25      ASSESSMENT: Acute on chronic systolic heart failure  PLAN:  Continue diuretics as renal function allows.  Elevate legs.  AFib,  Needs rate control. Start digoxin in addition to carvedilol given CHF.  COnsider referral to heart failure team to help with f/u and prevention of readmission.  Pradaxa for stroke prevention.  Corky Crafts., MD  09/05/2013  5:41 PM

## 2013-09-05 NOTE — Progress Notes (Signed)
MD notified of patient's BP and orders given; will continue to monitor patient. Lorretta Harp RN

## 2013-09-05 NOTE — Progress Notes (Signed)
Patient's BP is 152/102, patient is asymptomatic and MD text paged; will continue to monitor patient. Lorretta Harp RN

## 2013-09-05 NOTE — Clinical Documentation Improvement (Signed)
THIS DOCUMENT IS NOT A PERMANENT PART OF THE MEDICAL RECORD  Please update your documentation with the medical record to reflect your response to this query. If you need help knowing how to do this please call [phone].  09/05/13  Dear Dr. Benjamine Mola Marton Redwood,  In a better effort to capture your patient's severity of illness, reflect appropriate length of stay and utilization of resources, a review of the patient medical record has revealed the following indicators.    Based on your clinical judgment, please clarify and document in a progress note and/or discharge summary the clinical condition associated with the following supporting information:  In responding to this query please exercise your independent judgment.  The fact that a query is asked, does not imply that any particular answer is desired or expected.  Possible Clinical Conditions?  _______Acute Respiratory Failure _______Acute on Chronic Respiratory Failure _______Chronic Respiratory Failure _______Other Condition________________ _______Cannot Clinically Determine    Supporting Information:  Risk Factors:(As per notes)  "Acute CHF,COPD & A-FIB"  Signs&Symptoms:(As per notes) "On EMS arrival patient with O2 sat and high 80 percent range. Patient was given albuterol and Solu-Medrol with minimal effect however didn't was placed on CPAP with improvement in respiratory effort and symptoms. This included release of chest pressure. On arrival to ED vitals as above. Physical exam demonstrated diffuse wheezing and restriction. No Rales appreciated, however patient did have 3+ edema of bilateral lower extremities. EKG as above with new T wave inversions. Labs as above with elevation in BNP. Nitro paste applied. Breathing improved to nonlabored on 2L N"  Diagnostics: Radiology: CXR 09-04-13 IMPRESSION:  Stable moderate cardiomegaly. No active lung disease   Treatment: Oxygen: O2 concentrations: As low as 80  O2 Mode: (  BiPAP, CPAP, O2)  Respiratory Treatment: fluticasone (FLONASE) 50 MCG/ACT nasal spray [[id number]]   Order Details    Dose: 2 spray Route: Nasal Frequency: Daily         furosemide (LASIX) injection 40 mg   [[id number]]    Ordered Dose: 40 mg Route: Intravenous Frequency: 2 times daily    Treatment: (O2/L/min, Venti mask O2%, Non rebreather mask O2%, BiPAP, Vent)                   You may use possible, probable, or suspect with inpatient documentation. possible, probable, suspected diagnoses MUST be documented at the time of discharge  Reviewed: additional documentation in the medical record  Thank You,  Nevin Bloodgood RN, BSN, CCDS Clinical Documentation Specialist: [phone] Cell=[phone] Health Information Management [place]

## 2013-09-05 NOTE — Progress Notes (Signed)
Inpatient Diabetes Program Recommendations  AACE/ADA: New Consensus Statement on Inpatient Glycemic Control (2013)  Target Ranges:  Prepandial:   less than 140 mg/dL      Peak postprandial:   less than 180 mg/dL (1-2 hours)      Critically ill patients:  140 - 180 mg/dL     Results for ARAS, ALBARRAN (MRN [id number]) as of 09/05/2013 09:35  Ref. Range 09/04/2013 12:56 09/04/2013 16:19 09/04/2013 21:29  Glucose-Capillary Latest Range: 70-99 mg/dL 272 (H) 536 (H) 644 (H)    **Patient receiving Prednisone 40 mg daily.  Having postprandial glucose elevations.   **MD- Please consider adding Novolog meal coverage to current regimen- Novolog 6 units tid with meals    Will follow. Ambrose Finland RN, MSN, CDE Diabetes Coordinator Inpatient Diabetes Program Team Pager: [phone] (8a-10p)

## 2013-09-06 LAB — GLUCOSE, CAPILLARY
Glucose-Capillary: 235 mg/dL — ABNORMAL HIGH (ref 70–99)
Glucose-Capillary: 143 mg/dL — ABNORMAL HIGH (ref 70–99)
Glucose-Capillary: 151 mg/dL — ABNORMAL HIGH (ref 70–99)

## 2013-09-06 LAB — BASIC METABOLIC PANEL
BUN: 40 mg/dL — ABNORMAL HIGH (ref 6–23)
Chloride: 97 mEq/L (ref 96–112)
Creatinine, Ser: 1.11 mg/dL (ref 0.50–1.35)
GFR calc Af Amer: 80 mL/min — ABNORMAL LOW (ref >90)
Potassium: 3.8 mEq/L (ref 3.5–5.1)
Sodium: 139 mEq/L (ref 135–145)

## 2013-09-06 LAB — MAGNESIUM: Magnesium: 2.3 mg/dL (ref 1.5–2.5)

## 2013-09-06 MED ORDER — FUROSEMIDE 10 MG/ML IJ SOLN
80.0000 mg | Freq: Two times a day (BID) | INTRAMUSCULAR | Status: DC
  Administered 2013-09-06 – 2013-09-08 (×4): 80 mg via INTRAVENOUS
  Filled 2013-09-06 (×5): qty 8

## 2013-09-06 MED ORDER — PREDNISONE 10 MG PO TABS
30.0000 mg | ORAL_TABLET | Freq: Every day | ORAL | Status: DC
  Administered 2013-09-07: 30 mg via ORAL
  Filled 2013-09-06 (×2): qty 1

## 2013-09-06 MED ORDER — CALCIUM CARBONATE ANTACID 500 MG PO CHEW
1.0000 | CHEWABLE_TABLET | Freq: Three times a day (TID) | ORAL | Status: DC | PRN
  Administered 2013-09-07 – 2013-09-09 (×9): 200 mg via ORAL
  Filled 2013-09-06 (×13): qty 1

## 2013-09-06 MED ORDER — HYDRALAZINE HCL 25 MG PO TABS
12.5000 mg | ORAL_TABLET | Freq: Three times a day (TID) | ORAL | Status: DC
  Administered 2013-09-06 – 2013-09-09 (×8): 12.5 mg via ORAL
  Filled 2013-09-06 (×12): qty 0.5

## 2013-09-06 MED ORDER — PRAMOXINE-ZINC OXIDE IN MO 1-12.5 % RE OINT
TOPICAL_OINTMENT | Freq: Two times a day (BID) | RECTAL | Status: DC | PRN
  Administered 2013-09-06: 14:00:00 via RECTAL
  Filled 2013-09-06: qty 28.3

## 2013-09-06 MED ORDER — POTASSIUM CHLORIDE CRYS ER 20 MEQ PO TBCR
40.0000 meq | EXTENDED_RELEASE_TABLET | Freq: Two times a day (BID) | ORAL | Status: DC
  Administered 2013-09-06 – 2013-09-09 (×6): 40 meq via ORAL
  Filled 2013-09-06 (×7): qty 2

## 2013-09-06 MED ORDER — FUROSEMIDE 10 MG/ML IJ SOLN
40.0000 mg | Freq: Once | INTRAMUSCULAR | Status: AC
  Administered 2013-09-06: 14:00:00 40 mg via INTRAVENOUS

## 2013-09-06 MED ORDER — PHENYLEPH-SHARK LIV OIL-MO-PET 0.25-3-14-71.9 % RE OINT
TOPICAL_OINTMENT | Freq: Two times a day (BID) | RECTAL | Status: DC | PRN
  Filled 2013-09-06: qty 28.4

## 2013-09-06 MED ORDER — ISOSORBIDE MONONITRATE ER 30 MG PO TB24
30.0000 mg | ORAL_TABLET | Freq: Every day | ORAL | Status: DC
  Administered 2013-09-07 – 2013-09-09 (×3): 30 mg via ORAL
  Filled 2013-09-06 (×4): qty 1

## 2013-09-06 MED ORDER — APIXABAN 5 MG PO TABS
5.0000 mg | ORAL_TABLET | Freq: Two times a day (BID) | ORAL | Status: DC
  Administered 2013-09-06 – 2013-09-09 (×6): 5 mg via ORAL
  Filled 2013-09-06 (×7): qty 1

## 2013-09-06 NOTE — Clinical Social Work Psychosocial (Addendum)
     Clinical Social Work Department BRIEF PSYCHOSOCIAL ASSESSMENT 09/06/2013  Patient:  Frank Davidson, Frank Davidson     Account Number:  [id number]     Admit date:  09/04/2013  Clinical Social Worker:  Tiburcio Pea  Date/Time:  09/06/2013 01:19 PM  Referred by:  Physician  Date Referred:  09/05/2013 Referred for  SNF Placement   Other Referral:   Interview type:  Patient Other interview type:    PSYCHOSOCIAL DATA Living Status:  FACILITY Admitted from facility:  [facility] Level of care:  Skilled Nursing Facility Primary support name:  Geovany Trudo Primary support relationship to patient:  FAMILY Degree of support available:   Strong    CURRENT CONCERNS Current Concerns  Post-Acute Placement   Other Concerns:    SOCIAL WORK ASSESSMENT / PLAN 62 year old male, alrert and oriented. CSW met with patient bedside.  CSW disucssed dishcharge plans. Patient stated that he will not be returning to [facility]. Patient will be going home with neice. CSW has notified [facility] that he will not be returning. Will refer to RN case mananger for home health and equipment needs.   Assessment/plan status:  No Further Intervention Required Other assessment/ plan:   Information/referral to community resources:   none    PATIENTS/FAMILYS RESPONSE TO PLAN OF CARE: patient is alert and oriented, very plesant. He appaers to be happy to be going home with neice. Patient thanked [place] for coming in. Patient requested a buisness card. Card provided to patient.  Baruch Goldmann, BSW Intern [phone]    I have reviewed assessment and agree to contents.  Lorri Frederick. Jaci Lazier, LCSWA  [phone]  Supervisor

## 2013-09-06 NOTE — Progress Notes (Signed)
Patient is stable and orders has been given per [place] PA, will follow orders and continue to monitor patient. Lorretta Harp RN

## 2013-09-06 NOTE — Care Management Note (Addendum)
    Page 1 of 2   09/09/2013     4:17:43 PM   CARE MANAGEMENT NOTE 09/09/2013  Patient:  Frank Davidson, Frank Davidson   Account Number:  [id number]  Date Initiated:  09/06/2013  Documentation initiated by:  Tera Mater  Subjective/Objective Assessment:   62yo male admitted with CHF.  Pt. from [facility], and will return there upon discharge     Action/Plan:   discharge planning   Anticipated DC Date:  09/07/2013   Anticipated DC Plan:  HOME W HOME HEALTH SERVICES  In-house referral  Clinical Social Worker      DC Associate Professor  CM consult      [facility] Choice  HOME HEALTH   Choice offered to / List presented to:  C-1 Patient   DME arranged  Levan Hurst      DME agency  [facility]     [facility] arranged  HH-10 DISEASE MANAGEMENT  HH-1 RN  HH-3 OT  HH-2 PT      HH agency  [facility]   Status of service:  Completed, signed off Medicare Important Message given?   (If response is "NO", the following Medicare IM given date fields will be blank) Date Medicare IM given:   Date Additional Medicare IM given:    Discharge Disposition:  HOME W HOME HEALTH SERVICES  Per UR Regulation:  Reviewed for med. necessity/level of care/duration of stay  If discussed at Long Length of Stay Meetings, dates discussed:    Comments:  09/09/13 15:00 Cm notified by RN pt needs Eliquis free trial card.  CM brought card to room where pt's niece states she will activate card in the morning of 09/10/13 prior to getting her Uncle's (pt) prescription filled.  Pt understands he will need to call his PCP and let PCP know of Eliquis and obtain pre-authorization before 30 day trial card expires.  No other CM needs were communicated.  Freddy Jaksch, BSN, [place] [phone].  09/09/13 10:00 Cm notified [facility] of pt's discharge and earlier referral of HHPT/OT/RN.  Rolling walker to be delivered to room prior to discharge.  Address and contact number verified for [facility] services.  No other CM  needs were communicated.  Freddy Jaksch, BSN, [place] [phone].   09/06/13 1436 Noted pt. will not be returning to SNF upon discharge.  Pt. wants to go home with niece.  Pt. did not have a particular home health agency.  TC to Hilda Lias, with [facility] to give referral for [facility] PT/OT/RN, and pt. would like a rolling walker.  Will obtain order. Anticipate dc tomorrow. Tera Mater, RN, BSN [place] [phone]    09/06/13 1330 Noted Home health orders.  Pt. from [facility], and will return there upon discharge. Tera Mater, RN, BSN NCM [phone]

## 2013-09-06 NOTE — Progress Notes (Addendum)
CARDIAC REHAB PHASE I   PRE:  Rate/Rhythm: 101 Afib  BP:  Supine:   Sitting: 137/95  Standing:    SaO2: 97 RA  MODE:  Ambulation: 300 ft   POST:  Rate/Rhythm: 117  BP:  Supine:   Sitting: 145/88  Standing:    SaO2: 98 RA 1500-1530 Assisted X 2 and used walker to ambulate. Gait steady with walker. HR alarmed with HR greater than 140 at end of walk, but when we looked at monitor HR was 117. Pt without c/o when walking. Pt back to side of bed after walk with call light in reach. Discussed with pt CHF signs and symptoms. He has CHF packet. Pt able to recite zones. Gave pt low sodium diet sheets. He knows that 1500-2000 mg of sodium is his limit per day and to weigh daily.  Melina Copa RN 09/06/2013 3:41 PM

## 2013-09-06 NOTE — Progress Notes (Signed)
09/06/13 1436 Noted pt. will not be returning to SNF upon discharge.  Pt. wants to go home with niece.  Pt. did not have a particular home health agency.  TC to Hilda Lias, with [facility] to give referral for [facility] PT/OT/RN, and pt. would like a rolling walker.  Will obtain order.  Anticipate dc tomorrow.  Tera Mater, RN, BSN NCM [phone]

## 2013-09-06 NOTE — Progress Notes (Signed)
Patient had 17 beat run of [place], [facility] is currently in patient's room and was notified, will await for orders and follow orders as given; will continue to monitor patient. Lorretta Harp RN

## 2013-09-06 NOTE — Progress Notes (Signed)
Advanced Heart Failure Team Consult Note  Primary Physician: Dr. Donette Larry Primary Cardiologist: Dr. Katrinka Blazing  Reason for Admission: A/C HF  HPI:    Frank Davidson is a 62 yo male with a hx of obesity, atrial fibrillation (paroxysmal), HTN, COPD, HTN, gout, DM 2, CVA and NICM with EF 20-25% (06/2013). He is admitted with recurrent HF and we are asked to provide HF consultation.  Has previously followed in the [facility] in [place]. Previous cath in 2007 showed NICM with normal cors by report. Was living alone in [place] and apparently was  issues managing medications by himself and was getting confused with PCP and cardiologists medications and was not taking his meds correctly. So he was moved to [facility] this summer to be in [place] near his nieces. Has not been on ACE-I due to renal failure. Highest creatinine in our system is ~1.5 but was apparently higher previously - though to be related to taking his medications incorrectly.  Was admitted in 06/2013 from [facility] with left sided weakness and was found to have a moderate to lg sized right hemispheric infarct. Went back to [employer] [facility].   He presented to the ED 10/14 with progressive DOE over the last few days and increased bilateral LE edema. He was seen by Dr Katrinka Blazing in the office 8 days before admission and was doing well with a weight of 229 lbs. On admission his weight was 253 lbs and pertinent labs were pro-BNP 5685, K+ 4.5, Cr 1.1 and negative troponin.   He has been receiving 40 mg IV lasix and is out -3.8 liters and weight is down 13 lbs. BP has been elevated and recently changed IMDUR to Bidil. Denies SOB or CP. + DOE.    Review of Systems: [y] = yes, [ ]  = no   General: Weight gain [Y ]; Weight loss [ ] ; Anorexia [ ] ; Fatigue [ ] ; Fever [ ] ; Chills [ ] ; Weakness [ ]   Cardiac: Chest pain/pressure [ ] ; Resting SOB [ ] ; Exertional SOB [ Y]; Orthopnea [Y ]; Pedal Edema [Y ]; Palpitations [ ] ; Syncope [ ] ; Presyncope [ ] ;  Paroxysmal nocturnal dyspnea[ ]   Pulmonary: Cough [ ] ; Wheezing[ ] ; Hemoptysis[ ] ; Sputum [ ] ; Snoring [ ]   GI: Vomiting[ ] ; Dysphagia[ ] ; Melena[ ] ; Hematochezia [ ] ; Heartburn[ ] ; Abdominal pain [ ] ; Constipation [ ] ; Diarrhea [ ] ; BRBPR [ ]   GU: Hematuria[ ] ; Dysuria [ ] ; Nocturia[ ]   Vascular: Pain in legs with walking [ ] ; Pain in feet with lying flat [ ] ; Non-healing sores [ ] ; Stroke [ ] ; TIA [ ] ; Slurred speech [ ] ;  Neuro: Headaches[ ] ; Vertigo[ ] ; Seizures[ ] ; Paresthesias[ ] ;Blurred vision [ ] ; Diplopia [ ] ; Vision changes [ ]   Ortho/Skin: Arthritis [ ] ; Joint pain [ ] ; Muscle pain [ ] ; Joint swelling [ ] ; Back Pain [ ] ; Rash [ ]   Psych: Depression[ ] ; Anxiety[ ]   Heme: Bleeding problems [ ] ; Clotting disorders [ ] ; Anemia [ ]   Endocrine: Diabetes [ ] ; Thyroid dysfunction[ ]   Home Medications Prior to Admission medications   Medication Sig Start Date End Date Taking? Authorizing Provider  allopurinol (ZYLOPRIM) 100 MG tablet Take 100 mg by mouth daily.   Yes Historical Provider, MD  alum & mag hydroxide-simeth (MAALOX/MYLANTA) 200-200-20 MG/5ML suspension Take 30 mLs by mouth every 4 (four) hours as needed for indigestion.    Yes Historical Provider, MD  carvedilol (COREG) 25 MG tablet Take 25 mg by mouth 2 (two) times  daily with a meal.   Yes Historical Provider, MD  dabigatran (PRADAXA) 75 MG CAPS capsule Take 75 mg by mouth every 12 (twelve) hours.   Yes Historical Provider, MD  fluticasone (FLONASE) 50 MCG/ACT nasal spray Place 2 sprays into the nose daily. For 21 days starting 08/29/13   Yes Historical Provider, MD  furosemide (LASIX) 40 MG tablet Take 40 mg by mouth 2 (two) times daily.   Yes Historical Provider, MD  glipiZIDE (GLUCOTROL) 5 MG tablet Take 5 mg by mouth 2 (two) times daily before a meal.   Yes Historical Provider, MD  insulin aspart (NOVOLOG) 100 UNIT/ML injection Inject 0-8 Units into the skin 3 (three) times daily with meals. Sliding scale: <200=0 units,  201-250=2 units, 251-300=4 units, 301-350=6 units, 351-400=8 units, >400=CALL MD   Yes Historical Provider, MD  isosorbide dinitrate (ISORDIL) 20 MG tablet Take 20 mg by mouth daily.   Yes Historical Provider, MD  levalbuterol (XOPENEX) 0.63 MG/3ML nebulizer solution Take 3 mL (0.63 mg total) by nebulization every 6 (six) hours as needed for wheezing or shortness of breath. 07/09/13  Yes Nishant Dhungel, MD  loratadine (CLARITIN) 10 MG tablet Take 10 mg by mouth daily as needed for allergies.   Yes Historical Provider, MD  nitroGLYCERIN (NITROSTAT) 0.4 MG SL tablet Place 0.4 mg under the tongue every 5 (five) minutes as needed for chest pain.   Yes Historical Provider, MD  oxyCODONE-acetaminophen (PERCOCET/ROXICET) 5-325 MG per tablet Take 1 tablet by mouth every 8 (eight) hours as needed for pain.   Yes Historical Provider, MD  potassium chloride SA (K-DUR,KLOR-CON) 20 MEQ tablet Take 2 tablets (40 mEq total) by mouth daily. 07/09/13  Yes Nishant Dhungel, MD  predniSONE (DELTASONE) 20 MG tablet Take 2 tablets (40 mg total) by mouth daily with breakfast. 07/09/13  Yes Nishant Dhungel, MD  senna-docusate (SENOKOT-S) 8.6-50 MG per tablet Take 2 tablets by mouth daily.   Yes Historical Provider, MD    Past Medical History: Past Medical History  Diagnosis Date  . Fibromyalgia   . Atrial fibrillation   . Hypertension   . Osteoarthritis   . COPD (chronic obstructive pulmonary disease)   . Sleep apnea with use of continuous positive airway pressure (CPAP)   . Diabetes mellitus without complication   . Coronary artery disease   . Neuropathy   . Ocular herpes   . Gout   . CKD (chronic kidney disease), stage III   . CHF (congestive heart failure)     EF 15%  . Polysubstance abuse     Hx of  . Chronic pain   . Hyperlipidemia   . Nonischemic cardiomyopathy   . Stroke   . Ocular herpes   . Neuropathy   . Left-sided weakness   . Acute on chronic systolic heart failure     Past Surgical  History: Past Surgical History  Procedure Laterality Date  . 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 Cardiac catheterization  10/2006    normal coronary arteries    Family History: Sister with HF.  Social History: History   Social History  . Marital Status: Divorced    Spouse Name: N/A    Number of Children: N/A  . Years of Education: N/A   Social History Main Topics  . Smoking status: Never Smoker   . Smokeless tobacco: None  . Alcohol Use: No  . Drug Use: No  . Sexual Activity: None   Other Topics Concern  .  None   Social History Narrative  . None    Allergies:  Allergies  Allergen Reactions  . Actos [Pioglitazone] Other (See Comments)    Caused blood in urine and fluid retention     Objective:    Vital Signs:   Temp:  [97.3 F (36.3 C)-98.6 F (37 C)] 97.5 F (36.4 C) (10/16 0900) Pulse Rate:  [74-85] 74 (10/16 0900) Resp:  [18-20] 20 (10/16 0900) BP: (115-152)/(64-102) 120/98 mmHg (10/16 0900) SpO2:  [93 %-98 %] 96 % (10/16 0900) Weight:  [240 lb 11.2 oz (109.181 kg)] 240 lb 11.2 oz (109.181 kg) (10/16 0611) Last BM Date: 09/05/13  Weight change: Filed Weights   09/04/13 1252 09/05/13 0521 09/06/13 0611  Weight: 253 lb 12 oz (115.1 kg) 247 lb 3.2 oz (112.129 kg) 240 lb 11.2 oz (109.181 kg)    Intake/Output:   Intake/Output Summary (Last 24 hours) at 09/06/13 1113 Last data filed at 09/06/13 0938  Gross per 24 hour  Intake    723 ml  Output   2770 ml  Net  -2047 ml     Physical Exam: General:  Sitting up in bed. Well appearing. No resp difficulty HEENT: normal Neck: supple. Hard to see. Estimate JVP 7-8. Carotids 2+ bilat; no bruits. No lymphadenopathy or thryomegaly appreciated. Cor: PMI nonpalpable. Irregular Distant HS. No obvious murmur Lungs: clear Abdomen: markedly obese soft, nontender, nondistended.  No bruits or masses. Good bowel sounds. Extremities: no cyanosis, clubbing, rash, 2-3+ edema L>R Neuro: alert & orientedx3, cranial  nerves grossly intact. moves all 4 extremities w/o difficulty. Affect pleasant  Telemetry: Afib 70s  Labs: Basic Metabolic Panel:  Recent Labs Lab 09/04/13 0955 09/05/13 0135 09/06/13 0520  NA 138 140 139  K 4.5 4.7 3.8  CL 101 99 97  CO2 24 26 27   GLUCOSE 88 203* 114*  BUN 32* 42* 40*  CREATININE 1.11 1.24 1.11  CALCIUM 9.9 9.4 9.5    Liver Function Tests: No results found for this basename: AST, ALT, ALKPHOS, BILITOT, PROT, ALBUMIN,  in the last 168 hours No results found for this basename: LIPASE, AMYLASE,  in the last 168 hours No results found for this basename: AMMONIA,  in the last 168 hours  CBC:  Recent Labs Lab 09/04/13 0955  WBC 12.1*  HGB 15.8  HCT 46.9  MCV 80.3  PLT 99*    Cardiac Enzymes:  Recent Labs Lab 09/04/13 1410 09/04/13 1740 09/05/13 0135  TROPONINI <0.30 <0.30 <0.30    BNP: BNP (last 3 results)  Recent Labs  09/04/13 0955  PROBNP 5685.0*    CBG:  Recent Labs Lab 09/05/13 0635 09/05/13 1058 09/05/13 1655 09/05/13 2225 09/06/13 0620  GLUCAP 179* 170* 194* 153* 130*    Coagulation Studies:  Recent Labs  09/04/13 1316  LABPROT 13.9  INR 1.09    Imaging:  No results found.   Medications:     Current Medications: . allopurinol  100 mg Oral Daily  . carvedilol  25 mg Oral BID WC  . dabigatran  75 mg Oral Q12H  . digoxin  0.25 mg Oral Daily  . fluticasone  2 spray Each Nare Daily  . furosemide  40 mg Intravenous BID  . insulin aspart  0-9 Units Subcutaneous TID WC  . insulin aspart  6 Units Subcutaneous TID WC  . isosorbide-hydrALAZINE  1 tablet Oral BID  . potassium chloride SA  40 mEq Oral Daily  . [START ON 09/07/2013] predniSONE  30 mg Oral Q  breakfast  . senna-docusate  2 tablet Oral Daily  . sodium chloride  3 mL Intravenous Q12H     Infusions:      Assessment:   1) A/C systolic HF, EF 20-25% 2) NICM 3) HTN 4) h/o CVA 5) Gout 6) DM2 7) Atrial fibrillation, on pradaxa 8) COPD 9)  Chronic renal failure, stage III 10) Obesity 11) NSVT  Plan/Discussion:    Frank Davidson is a pleasant gentleman who has chronic systolic HF, EF 20-25%. He recently moved here from [facility] and was at [facility] for rehab after being admitted for HF in June in [place]. While he was at [facility] developed a stroke and was doing well with his HF management until a couple of days ago when his SOB got progressively worse and was brought to the hospital.  Weight is down 13 lbs since admission, however he still appears volume overloaded. Will increase lasix to 80 mg IV BID and increase K+ to 40 meq BID. Will change Bidil to IMDUR 30 mg daily and hydralazine 12.5 mg TID d/t cost as an outpt. Place TED hose. Order Mag level with tomorrow labs had 17 beat run of Vtach earlier.   Patient is no longer having issues with Cr, would recommend he be on either Coumadin or Eliquis 5 mg BID, will leave to primary team to address.   Will consult CR for education and to help with ambulation and will set up for HHRN/PT.  He will need close follow up in the HF clinic and we will get notes faxed from [facility]. If patient can have consistent follow up and is compliant with medications we will need to consider referral to EP for ICD. QRS 93 not CRT candidate.   Appreciate the consult.  Length of Stay: 2 Aundria Rud NP-C 09/06/2013, 11:13 AM Advanced Heart Failure Team Pager [phone] (M-F; 7a - 4p)  Please contact [facility] for night-coverage after hours (4p -7a ) and weekends on [email or web address]  Patient seen and examined with Ulla Potash, NP. We discussed all aspects of the encounter. I agree with the assessment and plan as stated above.   Patient with ADHF in setting of severe NICM previously followed by [facility]. Remains significantly volume overload. Agree with increasing lasix to 80 iv BID. Given cost issues will switch Bidil back to hydralazine and Imdur. Would like to eventually try to get him  on ACE-I or ARB if he can tolerate. Will need to get records from [facility]. Agree with changing Pradaxa to Eliquis 5 bid or coumadin - will elave to primary team to decide. Will need close f/u in HF clinic with consideration of ICD.   Hinata Diener,MD 12:59 PM

## 2013-09-06 NOTE — Progress Notes (Signed)
[facility] PROGRESS NOTE  Doniel Maiello UJW:[id number] DOB: [birth year] DOA: 09/04/2013 PCP: Georgann Housekeeper, MD (from [facility] so not his regular patient)  Assessment/Plan: Acute on chronic systolic CHF - BNP elevated at 5685, coreg, cards following, IV Lasix, strict I/Os, daily weights -ask heart failure team to see before d/c to SNF EF 20% Acute on Chronic Respiratory Failure  hemorrhoids -prep H  CAD with no angina- coreg  HTN - coreg  PAF  -coreg -dig  CKD (chronic kidney disease), stage III  - Close to usual baseline, monitor while on intravenous Lasix   COPD (chronic obstructive pulmonary disease)  - Shortness of breath more from congestive heart failure other than COPD. Continue with steroids that the patient was already on prior to this admission, continue with nebulized bronchodilators.   Diabetes mellitus without complication  - Hold glipizide, start sliding scale insulin.   Nonischemic dilated CM   Code Status: full Family Communication: patient Disposition Plan: back to snf in AM   Consultants:  cards  Procedures:    Antibiotics:  HPI/Subjective: Breathing much better today  Objective: Filed Vitals:   09/06/13 0611  BP: 115/64  Pulse: 80  Temp: 97.3 F (36.3 C)  Resp: 20    Intake/Output Summary (Last 24 hours) at 09/06/13 0831 Last data filed at 09/06/13 0118  Gross per 24 hour  Intake    483 ml  Output   2922 ml  Net  -2439 ml   Filed Weights   09/04/13 1252 09/05/13 0521 09/06/13 0611  Weight: 115.1 kg (253 lb 12 oz) 112.129 kg (247 lb 3.2 oz) 109.181 kg (240 lb 11.2 oz)    Exam:   General:  A+Ox3, NAD- not on O2  Cardiovascular: rrr  Respiratory: clear anterior, decreased overall  Abdomen: +BS, soft  Musculoskeletal: moves all 4 ext   Data Reviewed: Basic Metabolic Panel:  Recent Labs Lab 09/04/13 0955 09/05/13 0135 09/06/13 0520  NA 138 140 139  K 4.5 4.7 3.8  CL 101 99 97  CO2 24 26 27    GLUCOSE 88 203* 114*  BUN 32* 42* 40*  CREATININE 1.11 1.24 1.11  CALCIUM 9.9 9.4 9.5   Liver Function Tests: No results found for this basename: AST, ALT, ALKPHOS, BILITOT, PROT, ALBUMIN,  in the last 168 hours No results found for this basename: LIPASE, AMYLASE,  in the last 168 hours No results found for this basename: AMMONIA,  in the last 168 hours CBC:  Recent Labs Lab 09/04/13 0955  WBC 12.1*  HGB 15.8  HCT 46.9  MCV 80.3  PLT 99*   Cardiac Enzymes:  Recent Labs Lab 09/04/13 1410 09/04/13 1740 09/05/13 0135  TROPONINI <0.30 <0.30 <0.30   BNP (last 3 results)  Recent Labs  09/04/13 0955  PROBNP 5685.0*   CBG:  Recent Labs Lab 09/05/13 0635 09/05/13 1058 09/05/13 1655 09/05/13 2225 09/06/13 0620  GLUCAP 179* 170* 194* 153* 130*    Recent Results (from the past 240 hour(s))  MRSA PCR SCREENING     Status: None   Collection Time    09/04/13  1:11 PM      Result Value Range Status   MRSA by PCR NEGATIVE  NEGATIVE Final   Comment:            The GeneXpert MRSA Assay (FDA     approved for NASAL specimens     only), is one component of a     comprehensive MRSA colonization     surveillance program. It  is not     intended to diagnose MRSA     infection nor to guide or     monitor treatment for     MRSA infections.     Studies: Dg Chest Portable 1 View  09/04/2013   CLINICAL DATA:  Shortness of breath for 1 week  EXAM: PORTABLE CHEST - 1 VIEW  COMPARISON:  Chest x-ray of 07/03/2013  FINDINGS: Moderate cardiomegaly is stable. No active infiltrate or effusion is seen. Mediastinal contours are stable. There are diffuse degenerative changes throughout the thoracic spine.  IMPRESSION: Stable moderate cardiomegaly. No active lung disease   Electronically Signed   By: Dwyane Dee M.D.   On: 09/04/2013 09:25    Scheduled Meds: . allopurinol  100 mg Oral Daily  . carvedilol  25 mg Oral BID WC  . dabigatran  75 mg Oral Q12H  . digoxin  0.25 mg Oral  Daily  . fluticasone  2 spray Each Nare Daily  . furosemide  40 mg Intravenous BID  . insulin aspart  0-9 Units Subcutaneous TID WC  . insulin aspart  6 Units Subcutaneous TID WC  . isosorbide-hydrALAZINE  1 tablet Oral BID  . potassium chloride SA  40 mEq Oral Daily  . predniSONE  40 mg Oral Q breakfast  . senna-docusate  2 tablet Oral Daily  . sodium chloride  3 mL Intravenous Q12H   Continuous Infusions:   Active Problems:   Hypertension   COPD (chronic obstructive pulmonary disease)   Diabetes mellitus without complication   CKD (chronic kidney disease), stage III   Nonischemic cardiomyopathy   Left-sided weakness   Acute CHF   Chest pain    Time spent: 25 min    Naliyah Neth  [facility] Pager [phone]. If 7PM-7AM, please contact night-coverage at [email or web address], password [facility] 09/06/2013, 8:31 AM  LOS: 2 days

## 2013-09-06 NOTE — Progress Notes (Signed)
Patient Name: Frank Davidson Date of Encounter: 09/06/2013    SUBJECTIVE: Slightly improved. Breathing. In reviewing records, outpatient Lasix was being given as 40 mg twice a day. My records show that he should have been on 80 mg twice a day. I am not sure who decreased the dose as an outpatient. He denies chest pain. He needs anticoagulation because of chronic atrial fibrillation. The addition of digoxin is reasonable. Weight in my office in late September was 229 pounds. He is suffering from hemorrhoids. TELEMETRY:  Atrial fibrillation, with improved. Rate control Filed Vitals:   09/05/13 1355 09/05/13 1706 09/05/13 2229 09/06/13 0611  BP: 116/83 129/86 120/84 115/64  Pulse: 81 80 78 80  Temp: 98.6 F (37 C) 98 F (36.7 C)  97.3 F (36.3 C)  TempSrc: Oral Oral  Oral  Resp: 20 18 18 20   Height:      Weight:    240 lb 11.2 oz (109.181 kg)  SpO2: 97% 97% 93% 94%    Intake/Output Summary (Last 24 hours) at 09/06/13 0827 Last data filed at 09/06/13 0118  Gross per 24 hour  Intake    483 ml  Output   2922 ml  Net  -2439 ml    LABS: Basic Metabolic Panel:  Recent Labs  16/10/96 0135 09/06/13 0520  NA 140 139  K 4.7 3.8  CL 99 97  CO2 26 27  GLUCOSE 203* 114*  BUN 42* 40*  CREATININE 1.24 1.11  CALCIUM 9.4 9.5   CBC:  Recent Labs  09/04/13 0955  WBC 12.1*  HGB 15.8  HCT 46.9  MCV 80.3  PLT 99*   Cardiac Enzymes:  Recent Labs  09/04/13 1410 09/04/13 1740 09/05/13 0135  TROPONINI <0.30 <0.30 <0.30   Net I/O: -3470 cc Weight: 253 lbs on admission and today 241 lbs. (Last office weight in Sept. 229). Radiology/Studies:  CXR 09/04/13 with CE and no acute CHF  Physical Exam: Blood pressure 115/64, pulse 80, temperature 97.3 F (36.3 C), temperature source Oral, resp. rate 20, height 5\' 10"  (1.778 m), weight 240 lb 11.2 oz (109.181 kg), SpO2 94.00%. Weight change: 5 lb 11.2 oz (2.586 kg)   Lower extremity edema has improved. Chest reveals decreased  breath sounds at the right base Irregularly irregular rhythm is noted. Neck veins are mildly distended with patient sitting at 90. Patient is comfortable, and performing his on bath without difficulty.  ASSESSMENT:  1. Acute on chronic systolic heart failure, improving with diuresis. There is a discrepancy and the dosing of Lasix as an outpatient. He should have been on 80 mg twice daily according to the admission records. He was on 40 mg twice a day.  2. Atrial fibrillation with rate control.  3. Hemorrhoidal discomfort.  4. Azotemia secondary to steroid therapy.    Plan:  1. Continue diuresis. Diuresis could potentially be a little more aggressive, but we're moving in the right correction. When we switched to oral diuretic therapy, it should be furosemide 80 mg twice a day  2. He needs therapy of hemorrhoids.  3. Steroid therapy is aggravating management of heart failure.  Selinda Eon 09/06/2013, 8:27 AM

## 2013-09-07 DIAGNOSIS — I6350 Cerebral infarction due to unspecified occlusion or stenosis of unspecified cerebral artery: Secondary | ICD-10-CM

## 2013-09-07 DIAGNOSIS — Z7901 Long term (current) use of anticoagulants: Secondary | ICD-10-CM

## 2013-09-07 LAB — BASIC METABOLIC PANEL
BUN: 36 mg/dL — ABNORMAL HIGH (ref 6–23)
Creatinine, Ser: 1.18 mg/dL (ref 0.50–1.35)
GFR calc Af Amer: 74 mL/min — ABNORMAL LOW (ref >90)
GFR calc non Af Amer: 64 mL/min — ABNORMAL LOW (ref >90)
Potassium: 3.9 mEq/L (ref 3.5–5.1)

## 2013-09-07 LAB — GLUCOSE, CAPILLARY
Glucose-Capillary: 110 mg/dL — ABNORMAL HIGH (ref 70–99)
Glucose-Capillary: 156 mg/dL — ABNORMAL HIGH (ref 70–99)
Glucose-Capillary: 135 mg/dL — ABNORMAL HIGH (ref 70–99)

## 2013-09-07 LAB — CBC
HCT: 45.4 % (ref 39.0–52.0)
Hemoglobin: 15.8 g/dL (ref 13.0–17.0)
MCHC: 34.8 g/dL (ref 30.0–36.0)
RBC: 5.69 MIL/uL (ref 4.22–5.81)
RDW: 22.7 % — ABNORMAL HIGH (ref 11.5–15.5)
WBC: 13 10*3/uL — ABNORMAL HIGH (ref 4.0–10.5)

## 2013-09-07 MED ORDER — ALUM & MAG HYDROXIDE-SIMETH 200-200-20 MG/5ML PO SUSP
15.0000 mL | ORAL | Status: DC | PRN
  Administered 2013-09-07 – 2013-09-09 (×4): 15 mL via ORAL
  Filled 2013-09-07 (×4): qty 30

## 2013-09-07 MED ORDER — PANTOPRAZOLE SODIUM 40 MG PO TBEC
40.0000 mg | DELAYED_RELEASE_TABLET | Freq: Every day | ORAL | Status: DC
  Administered 2013-09-07: 14:00:00 40 mg via ORAL
  Filled 2013-09-07: qty 1

## 2013-09-07 MED ORDER — CALCIUM CARBONATE ANTACID 500 MG PO CHEW
2.0000 | CHEWABLE_TABLET | ORAL | Status: AC
  Administered 2013-09-07: 400 mg via ORAL
  Filled 2013-09-07: qty 2

## 2013-09-07 MED ORDER — LISINOPRIL 2.5 MG PO TABS
2.5000 mg | ORAL_TABLET | Freq: Every day | ORAL | Status: DC
  Administered 2013-09-07 – 2013-09-09 (×3): 2.5 mg via ORAL
  Filled 2013-09-07 (×3): qty 1

## 2013-09-07 MED ORDER — PREDNISONE 20 MG PO TABS
20.0000 mg | ORAL_TABLET | Freq: Every day | ORAL | Status: DC
  Administered 2013-09-08: 20 mg via ORAL
  Filled 2013-09-07 (×2): qty 1

## 2013-09-07 NOTE — Progress Notes (Signed)
Utilization Review Completed.   Gabbi Whetstone, RN, BSN Nurse Case Manager  [phone]  

## 2013-09-07 NOTE — Progress Notes (Signed)
[facility] PROGRESS NOTE  Frank Davidson ZOX:[id number] DOB: [birth year] DOA: 09/04/2013 PCP: Frank Housekeeper, MD (from [facility] so not his regular patient)  Assessment/Plan: Acute on chronic systolic CHF - BNP elevated at 5685, coreg, cards following, IV Lasix, strict I/Os, daily weights -appreciated heart failure team  WEIGHT 235 lbs and OP dry weight 229 in September Switch to oral diuretic regimen, Furosemide 80 mg BID  in AM EF 20%  Acute on Chronic Respiratory Failure  hemorrhoids -prep H  CAD with no angina- coreg  HTN - coreg  PAF  -coreg -dig  CKD (chronic kidney disease), stage III  - Close to usual baseline, monitor while on  Lasix   COPD (chronic obstructive pulmonary disease)  - Shortness of breath more from congestive heart failure other than COPD. Continue with steroids that the patient was already on prior to this admission, continue with nebulized bronchodilators.   Diabetes mellitus without complication  - Hold glipizide, start sliding scale insulin.   Nonischemic dilated CM   Code Status: full Family Communication: patient Disposition Plan: back to snf in AM   Consultants:  cards  Procedures:    Antibiotics:  HPI/Subjective: Breathing much better today  Objective: Filed Vitals:   09/07/13 0933  BP: 127/86  Pulse: 63  Temp:   Resp:     Intake/Output Summary (Last 24 hours) at 09/07/13 1149 Last data filed at 09/07/13 0944  Gross per 24 hour  Intake    980 ml  Output   1625 ml  Net   -645 ml   Filed Weights   09/05/13 0521 09/06/13 0611 09/07/13 0454  Weight: 112.129 kg (247 lb 3.2 oz) 109.181 kg (240 lb 11.2 oz) 106.867 kg (235 lb 9.6 oz)    Exam:   General:  A+Ox3, NAD- not on O2  Cardiovascular: rrr  Respiratory: clear anterior, decreased overall  Abdomen: +BS, soft  Musculoskeletal: moves all 4 ext   Data Reviewed: Basic Metabolic Panel:  Recent Labs Lab 09/04/13 0955 09/05/13 0135  09/06/13 0520 09/06/13 1227 09/07/13 0510  NA 138 140 139  --  139  K 4.5 4.7 3.8  --  3.9  CL 101 99 97  --  99  CO2 24 26 27   --  29  GLUCOSE 88 203* 114*  --  116*  BUN 32* 42* 40*  --  36*  CREATININE 1.11 1.24 1.11  --  1.18  CALCIUM 9.9 9.4 9.5  --  9.5  MG  --   --   --  2.3  --    Liver Function Tests: No results found for this basename: AST, ALT, ALKPHOS, BILITOT, PROT, ALBUMIN,  in the last 168 hours No results found for this basename: LIPASE, AMYLASE,  in the last 168 hours No results found for this basename: AMMONIA,  in the last 168 hours CBC:  Recent Labs Lab 09/04/13 0955 09/07/13 0510  WBC 12.1* 13.0*  HGB 15.8 15.8  HCT 46.9 45.4  MCV 80.3 79.8  PLT 99* 124*   Cardiac Enzymes:  Recent Labs Lab 09/04/13 1410 09/04/13 1740 09/05/13 0135  TROPONINI <0.30 <0.30 <0.30   BNP (last 3 results)  Recent Labs  09/04/13 0955  PROBNP 5685.0*   CBG:  Recent Labs Lab 09/06/13 0620 09/06/13 1110 09/06/13 1557 09/06/13 2058 09/07/13 0614  GLUCAP 130* 235* 143* 151* 110*    Recent Results (from the past 240 hour(s))  MRSA PCR SCREENING     Status: None   Collection Time  09/04/13  1:11 PM      Result Value Range Status   MRSA by PCR NEGATIVE  NEGATIVE Final   Comment:            The GeneXpert MRSA Assay (FDA     approved for NASAL specimens     only), is one component of a     comprehensive MRSA colonization     surveillance program. It is not     intended to diagnose MRSA     infection nor to guide or     monitor treatment for     MRSA infections.     Studies: No results found.  Scheduled Meds: . allopurinol  100 mg Oral Daily  . apixaban  5 mg Oral BID  . carvedilol  25 mg Oral BID WC  . digoxin  0.25 mg Oral Daily  . fluticasone  2 spray Each Nare Daily  . furosemide  80 mg Intravenous BID  . hydrALAZINE  12.5 mg Oral Q8H  . insulin aspart  0-9 Units Subcutaneous TID WC  . insulin aspart  6 Units Subcutaneous TID WC  .  isosorbide mononitrate  30 mg Oral Daily  . lisinopril  2.5 mg Oral Daily  . potassium chloride SA  40 mEq Oral BID  . [START ON 09/08/2013] predniSONE  20 mg Oral Q breakfast  . senna-docusate  2 tablet Oral Daily  . sodium chloride  3 mL Intravenous Q12H   Continuous Infusions:   Active Problems:   Hypertension   COPD (chronic obstructive pulmonary disease)   Diabetes mellitus without complication   CKD (chronic kidney disease), stage III   Nonischemic cardiomyopathy   Left-sided weakness   Acute CHF   Chest pain    Time spent: 25 min    [name]  [facility] Pager [phone]. If 7PM-7AM, please contact night-coverage at [email or web address], password [facility] 09/07/2013, 11:49 AM  LOS: 3 days

## 2013-09-07 NOTE — Progress Notes (Signed)
Patient Name: Frank Davidson Date of Encounter: 09/07/2013    SUBJECTIVE: Dyspnea has improved.  TELEMETRY:  A fib with controlled rate.: Filed Vitals:   09/06/13 1410 09/06/13 1722 09/06/13 1944 09/07/13 0454  BP: 128/82 142/95 123/79 119/73  Pulse: 73  84 75  Temp: 98.7 F (37.1 C)  97.6 F (36.4 C) 97.4 F (36.3 C)  TempSrc: Oral  Oral Oral  Resp: 20 20 18 18   Height:      Weight:    235 lb 9.6 oz (106.867 kg)  SpO2: 99% 100% 100% 100%    Intake/Output Summary (Last 24 hours) at 09/07/13 0721 Last data filed at 09/07/13 0000  Gross per 24 hour  Intake    780 ml  Output   1975 ml  Net  -1195 ml   I/O net since admission: -4665 ml  LABS: Basic Metabolic Panel:  Recent Labs  16/10/96 0520 09/06/13 1227 09/07/13 0510  NA 139  --  139  K 3.8  --  3.9  CL 97  --  99  CO2 27  --  29  GLUCOSE 114*  --  116*  BUN 40*  --  36*  CREATININE 1.11  --  1.18  CALCIUM 9.5  --  9.5  MG  --  2.3  --    CBC:  Recent Labs  09/04/13 0955 09/07/13 0510  WBC 12.1* 13.0*  HGB 15.8 15.8  HCT 46.9 45.4  MCV 80.3 79.8  PLT 99* 124*   Cardiac Enzymes:  Recent Labs  09/04/13 1410 09/04/13 1740 09/05/13 0135  TROPONINI <0.30 <0.30 <0.30     Radiology/Studies:  No new data  Physical Exam: Blood pressure 119/73, pulse 75, temperature 97.4 F (36.3 C), temperature source Oral, resp. rate 18, height 5\' 10"  (1.778 m), weight 235 lb 9.6 oz (106.867 kg), SpO2 100.00%. Weight change: -5 lb 1.6 oz (-2.313 kg)   Decreased breath sounds at both bases Irregularly irregular rhythm No edema  WEIGHT 235 lbs and OP dry weight 229 in September.  ASSESSMENT:  1. Acute systolic heart failure is improved and was due to inadequate diuretic dosing in nursing facility. 2. Chronic atrial fibrillation with rate control. 3. Prior stroke and chronic anticoagulation   Plan:  1. Switch to oral diuretic regimen, Furosemide 80 mg BID today or in AM  Signed, Veatrice Kells  W 09/07/2013, 7:21 AM

## 2013-09-07 NOTE — Progress Notes (Signed)
CARDIAC REHAB PHASE I   PRE:  Rate/Rhythm: 79-92 A Fib  BP:  Supine:   Sitting: 127/95  Standing:    SaO2: 98  MODE:  Ambulation: 300 ft   POST:  Rate/Rhythm: 107 A Fib  BP:  Supine:   Sitting: 124/89  Standing:    SaO2: 97 RA  Patient tolerated ambulation fairly well with a walker and assistance x 1, balance and gait are unsteady, needs a walker to ambulate safely.  Reinforced daily weights, and when to call th doctor, patient able to teach back education.  [id number]  Cindra Eves RN, BSN 09/07/2013 1540

## 2013-09-07 NOTE — Progress Notes (Signed)
Advanced Heart Failure Rounding Note   Subjective:    Frank Davidson is a 62 yo male with a hx of obesity, atrial fibrillation (paroxysmal), HTN, COPD, HTN, gout, DM 2, CVA and NICM with EF 20-25% (06/2013). He is admitted with recurrent HF and we are asked to provide HF consultation.  Has previously followed in the [facility] in [place]. Previous cath in 2007 showed NICM with normal cors by report. Was living alone in [place] and apparently was issues managing medications by himself and was getting confused with PCP and cardiologists medications and was not taking his meds correctly. So he was moved to [facility] this summer to be in [place] near his nieces. Has not been on ACE-I due to renal failure. Highest creatinine in our system is ~1.5 but was apparently higher previously - though to be related to taking his medications incorrectly.   Lasix increased to 80 mg IV BID and Bidil changed to IMDUR and hydralazine yesterday d/t cost. Weight down 5 lbs, 24 hr I/O 1.1 liters. Denies SOB, orthopnea or CP. Walked with CR yesterday and HR up to 140s after ambulating 300 ft.     Objective:   Weight Range:  Vital Signs:   Temp:  [97.4 F (36.3 C)-98.7 F (37.1 C)] 97.4 F (36.3 C) (10/17 0454) Pulse Rate:  [63-93] 63 (10/17 0933) Resp:  [18-20] 18 (10/17 0454) BP: (103-142)/(68-95) 127/86 mmHg (10/17 0933) SpO2:  [96 %-100 %] 100 % (10/17 0454) Weight:  [235 lb 9.6 oz (161.096 kg)] 235 lb 9.6 oz (106.867 kg) (10/17 0454) Last BM Date: 09/06/13  Weight change: Filed Weights   09/05/13 0521 09/06/13 0611 09/07/13 0454  Weight: 247 lb 3.2 oz (112.129 kg) 240 lb 11.2 oz (109.181 kg) 235 lb 9.6 oz (106.867 kg)    Intake/Output:   Intake/Output Summary (Last 24 hours) at 09/07/13 0947 Last data filed at 09/07/13 0944  Gross per 24 hour  Intake    980 ml  Output   1625 ml  Net   -645 ml    Physical Exam:  General: Sitting up in bed. Well appearing. No resp difficulty  HEENT:  normal  Neck: supple. Hard to see. Estimate JVP 7-8. Carotids 2+ bilat; no bruits. No lymphadenopathy or thryomegaly appreciated.  Cor: PMI nonpalpable. Irregular Distant HS. No obvious murmur  Lungs: clear  Abdomen: markedly obese soft, nontender, nondistended. No bruits or masses. Good bowel sounds.  Extremities: no cyanosis, clubbing, rash, 2+ edema L>R  Neuro: alert & orientedx3, cranial nerves grossly intact. moves all 4 extremities w/o difficulty. Affect pleasant  Telemetry: Afib 80s  Labs: Basic Metabolic Panel:  Recent Labs Lab 09/04/13 0955 09/05/13 0135 09/06/13 0520 09/06/13 1227 09/07/13 0510  NA 138 140 139  --  139  K 4.5 4.7 3.8  --  3.9  CL 101 99 97  --  99  CO2 24 26 27   --  29  GLUCOSE 88 203* 114*  --  116*  BUN 32* 42* 40*  --  36*  CREATININE 1.11 1.24 1.11  --  1.18  CALCIUM 9.9 9.4 9.5  --  9.5  MG  --   --   --  2.3  --     Liver Function Tests: No results found for this basename: AST, ALT, ALKPHOS, BILITOT, PROT, ALBUMIN,  in the last 168 hours No results found for this basename: LIPASE, AMYLASE,  in the last 168 hours No results found for this basename: AMMONIA,  in the  last 168 hours  CBC:  Recent Labs Lab 09/04/13 0955 09/07/13 0510  WBC 12.1* 13.0*  HGB 15.8 15.8  HCT 46.9 45.4  MCV 80.3 79.8  PLT 99* 124*    Cardiac Enzymes:  Recent Labs Lab 09/04/13 1410 09/04/13 1740 09/05/13 0135  TROPONINI <0.30 <0.30 <0.30    BNP: BNP (last 3 results)  Recent Labs  09/04/13 0955  PROBNP 5685.0*    Imaging:  No results found.   Medications:     Scheduled Medications: . allopurinol  100 mg Oral Daily  . apixaban  5 mg Oral BID  . carvedilol  25 mg Oral BID WC  . digoxin  0.25 mg Oral Daily  . fluticasone  2 spray Each Nare Daily  . furosemide  80 mg Intravenous BID  . hydrALAZINE  12.5 mg Oral Q8H  . insulin aspart  0-9 Units Subcutaneous TID WC  . insulin aspart  6 Units Subcutaneous TID WC  . isosorbide  mononitrate  30 mg Oral Daily  . potassium chloride SA  40 mEq Oral BID  . predniSONE  30 mg Oral Q breakfast  . senna-docusate  2 tablet Oral Daily  . sodium chloride  3 mL Intravenous Q12H     Infusions:     PRN Medications:  sodium chloride, acetaminophen, calcium carbonate, hydrALAZINE, levalbuterol, loratadine, nitroGLYCERIN, ondansetron (ZOFRAN) IV, oxyCODONE-acetaminophen, pramoxine-mineral oil-zinc, sodium chloride   Assessment:   1) A/C systolic HF, EF 20-25%  2) NICM  3) HTN  4) h/o CVA  5) Gout  6) DM2  7) Atrial fibrillation, on pradaxa  8) COPD  9) Chronic renal failure, stage III  10) Obesity  11) NSVT   Plan/Discussion:    Stable overnight. Weight down another 5 lbs, however volume status still elevated. Weight in Dr. Lonn Georgia office was 229 lbs in September. He is now 235. Will continue 80 mg IV lasix for one more day and then go back to PO. Cr stable 1.18.  He now is on Eliquis 5 mg BID, no signs of bleeding.  SBP 130s will start low dose ACE-I, lisinopril 2.5 mg daily.  Will place appt on chart if he goes home over the weekend for follow up in HF clinic next week.   Awaiting records from [facility].   Length of Stay: 3 Aundria Rud 09/07/2013, 9:47 AM  Advanced Heart Failure Team Pager [phone] (M-F; 7a - 4p)  Please contact [facility] for night-coverage after hours (4p -7a ) and weekends on [email or web address]  Patient seen and examined with Ulla Potash, NP. We discussed all aspects of the encounter. I agree with the assessment and plan as stated above.   He is much improved. Weight down 18 pounds overall. Still with 5-10 pounds to go. Agree with continuing IV lasix for 1-2 more days. May be able to go home on Sunday. Continue b-blocker and hyrdal/NTG. Low dose ACE added back today as renal function improved. Will follow. Will arrange for outpateint visit in HF clinic in near future. Agree with changing Pradaxa to Eliquis particularly in  light of heartburn he is having.   Margarethe Virgen,MD 1:01 PM

## 2013-09-07 NOTE — Plan of Care (Signed)
Problem: Food- and Nutrition-Related Knowledge Deficit (NB-1.1) Goal: Nutrition education Formal process to instruct or train a patient/client in a skill or to impart knowledge to help patients/clients voluntarily manage or modify food choices and eating behavior to maintain or improve health. Outcome: Completed/Met Date Met:  09/07/13  Nutrition Education Note  RD consulted for nutrition education regarding CHF. Pt states that he is familiar with the need for a salt restriction in his diet.  RD provided "Low Sodium Nutrition Therapy" handout from the Academy of [employer]etics. Reviewed patient's dietary recall. Provided examples on ways to decrease sodium intake in diet. Discouraged intake of processed foods and use of salt shaker. Encouraged fresh fruits and vegetables as well as whole grain sources of carbohydrates to maximize fiber intake.   RD discussed why it is important for patient to adhere to diet recommendations, and emphasized the role of fluids, foods to avoid, and importance of weighing self daily. Teach back method used.  Expect good compliance.  Body mass index is 33.8 kg/(m^2). Pt meets criteria for Obese Class I based on current BMI.  Current diet order is Heart Healthy, patient is consuming approximately 100% of meals at this time. Labs and medications reviewed. No further nutrition interventions warranted at this time. RD contact information provided. If additional nutrition issues arise, please re-consult RD.   Jarold Motto MS, RD, LDN Pager: [phone] After-hours pager: [phone]

## 2013-09-08 DIAGNOSIS — J449 Chronic obstructive pulmonary disease, unspecified: Secondary | ICD-10-CM

## 2013-09-08 LAB — GLUCOSE, CAPILLARY
Glucose-Capillary: 206 mg/dL — ABNORMAL HIGH (ref 70–99)
Glucose-Capillary: 147 mg/dL — ABNORMAL HIGH (ref 70–99)
Glucose-Capillary: 116 mg/dL — ABNORMAL HIGH (ref 70–99)

## 2013-09-08 MED ORDER — FUROSEMIDE 80 MG PO TABS
80.0000 mg | ORAL_TABLET | Freq: Two times a day (BID) | ORAL | Status: DC
  Administered 2013-09-08 – 2013-09-09 (×2): 80 mg via ORAL
  Filled 2013-09-08 (×4): qty 1

## 2013-09-08 MED ORDER — PANTOPRAZOLE SODIUM 40 MG PO TBEC
40.0000 mg | DELAYED_RELEASE_TABLET | Freq: Two times a day (BID) | ORAL | Status: DC
  Administered 2013-09-08 – 2013-09-09 (×3): 40 mg via ORAL
  Filled 2013-09-08 (×3): qty 1

## 2013-09-08 MED ORDER — PREDNISONE 10 MG PO TABS
10.0000 mg | ORAL_TABLET | Freq: Every day | ORAL | Status: DC
  Administered 2013-09-09: 07:00:00 10 mg via ORAL
  Filled 2013-09-08 (×2): qty 1

## 2013-09-08 MED ORDER — SIMETHICONE 80 MG PO CHEW
80.0000 mg | CHEWABLE_TABLET | Freq: Four times a day (QID) | ORAL | Status: DC | PRN
  Administered 2013-09-08 – 2013-09-09 (×4): 80 mg via ORAL
  Filled 2013-09-08 (×5): qty 1

## 2013-09-08 MED ORDER — POLYETHYLENE GLYCOL 3350 17 G PO PACK
17.0000 g | PACK | Freq: Every day | ORAL | Status: DC | PRN
  Filled 2013-09-08: qty 1

## 2013-09-08 NOTE — Progress Notes (Addendum)
C/o persistent gas pain, although given Mylanta and Tums. Client instructed to use urinal so we may keep up with his output.

## 2013-09-08 NOTE — Progress Notes (Signed)
CARDIAC REHAB PHASE I   PRE:  Rate/Rhythm: 69 Afib    BP:  Sitting: 130/78     SaO2: 98 RA  MODE:  Ambulation: 300 ft   POST:  Rate/Rhythm: 88 AFIB  BP:  Sitting: 130/88     SaO2: 92 RA  9:50AM-10:30AM Patient tolerated walk okay.  Patient tends to lean on walker.  Patient was encouraged not to lean on walker and to stand up straight.  Patient states he needs a walker for home. Patient did not complain of SOB.  Needed two rest breaks. Patient was left on side of bed.  Theresa Duty, [place] 09/08/2013 10:28 AM

## 2013-09-08 NOTE — Progress Notes (Signed)
Subjective:  Not short of breath today, sitting up on side of bed, no chest pain.  Objective:  Vital Signs in the last 24 hours: BP 102/62  Pulse 61  Temp(Src) 98.3 F (36.8 C) (Axillary)  Resp 18  Ht 5\' 10"  (1.778 m)  Wt 105.008 kg (231 lb 8 oz)  BMI 33.22 kg/m2  SpO2 100%  Physical Exam: Obese black male currently in no acute distress Lungs:  Clear  Cardiac:  Irregular rhythm, normal S1 and S2, no S3 Abdomen:  Soft, nontender, no masses Extremities:  Trace edema present  Intake/Output from previous day: 10/17 0701 - 10/18 0700 In: 1980 [P.O.:1980] Out: 1575 [Urine:1575] Weight Filed Weights   09/06/13 0611 09/07/13 0454 09/08/13 0444  Weight: 109.181 kg (240 lb 11.2 oz) 106.867 kg (235 lb 9.6 oz) 105.008 kg (231 lb 8 oz)    Lab Results: Basic Metabolic Panel:  Recent Labs  16/10/96 0520 09/07/13 0510  NA 139 139  K 3.8 3.9  CL 97 99  CO2 27 29  GLUCOSE 114* 116*  BUN 40* 36*  CREATININE 1.11 1.18    CBC:  Recent Labs  09/07/13 0510  WBC 13.0*  HGB 15.8  HCT 45.4  MCV 79.8  PLT 124*    BNP    Component Value Date/Time   PROBNP 5685.0* 09/04/2013 0955    Telemetry: Atrial fibrillation with controlled response  Assessment/Plan:  1. Acute systolic heart failure is improved and was due to inadequate diuretic dosing in nursing facility.  2. Chronic atrial fibrillation with rate control.  3. Prior stroke and chronic anticoagulation   Rec:  He appears to be approaching his dry weight. His weight is down another 4 pounds. Will switch to oral diuretics beginning tonight. Probably will be discharged on Monday.     Darden Palmer  MD [facility]  09/08/2013, 10:23 AM

## 2013-09-08 NOTE — Progress Notes (Signed)
[facility] PROGRESS NOTE  Frank Davidson OZH:[id number] DOB: 1951-07-14 DOA: 09/04/2013 PCP: Georgann Housekeeper, MD (from [facility] so not his regular patient)  Assessment/Plan: Acute on chronic systolic CHF - BNP elevated at 5685, coreg, cards following, IV Lasix, strict I/Os, daily weights -appreciated heart failure team  WEIGHT 235 lbs and OP dry weight 229 in September Switch to oral diuretic regimen, Furosemide 80 mg BID  in AM EF 20%  Acute on Chronic Respiratory Failure  hemorrhoids -prep H  CAD with no angina- coreg  HTN - coreg  PAF  -coreg -dig  CKD (chronic kidney disease), stage III  - Close to usual baseline, monitor while on  Lasix   COPD (chronic obstructive pulmonary disease)  - Shortness of breath more from congestive heart failure other than COPD. Continue with steroids that the patient was already on prior to this admission, continue with nebulized bronchodilators.   Diabetes mellitus without complication  - Hold glipizide, start sliding scale insulin.   Nonischemic dilated CM   Code Status: full Family Communication: patient Disposition Plan: d/c home in AM   Consultants:  cards  Procedures:    Antibiotics:  HPI/Subjective: Breathing much better today Weight down +BM  Objective: Filed Vitals:   09/08/13 0444  BP: 102/62  Pulse: 61  Temp: 98.3 F (36.8 C)  Resp: 18    Intake/Output Summary (Last 24 hours) at 09/08/13 0847 Last data filed at 09/08/13 0751  Gross per 24 hour  Intake   1960 ml  Output   1775 ml  Net    185 ml   Filed Weights   09/06/13 0611 09/07/13 0454 09/08/13 0444  Weight: 109.181 kg (240 lb 11.2 oz) 106.867 kg (235 lb 9.6 oz) 105.008 kg (231 lb 8 oz)    Exam:   General:  A+Ox3, NAD- not on O2  Cardiovascular: rrr  Respiratory: clear anterior, decreased overall  Abdomen: +BS, soft  Musculoskeletal: moves all 4 ext   Data Reviewed: Basic Metabolic Panel:  Recent Labs Lab  09/04/13 0955 09/05/13 0135 09/06/13 0520 09/06/13 1227 09/07/13 0510  NA 138 140 139  --  139  K 4.5 4.7 3.8  --  3.9  CL 101 99 97  --  99  CO2 24 26 27   --  29  GLUCOSE 88 203* 114*  --  116*  BUN 32* 42* 40*  --  36*  CREATININE 1.11 1.24 1.11  --  1.18  CALCIUM 9.9 9.4 9.5  --  9.5  MG  --   --   --  2.3  --    Liver Function Tests: No results found for this basename: AST, ALT, ALKPHOS, BILITOT, PROT, ALBUMIN,  in the last 168 hours No results found for this basename: LIPASE, AMYLASE,  in the last 168 hours No results found for this basename: AMMONIA,  in the last 168 hours CBC:  Recent Labs Lab 09/04/13 0955 09/07/13 0510  WBC 12.1* 13.0*  HGB 15.8 15.8  HCT 46.9 45.4  MCV 80.3 79.8  PLT 99* 124*   Cardiac Enzymes:  Recent Labs Lab 09/04/13 1410 09/04/13 1740 09/05/13 0135  TROPONINI <0.30 <0.30 <0.30   BNP (last 3 results)  Recent Labs  09/04/13 0955  PROBNP 5685.0*   CBG:  Recent Labs Lab 09/06/13 1557 09/06/13 2058 09/07/13 0614 09/07/13 1614 09/07/13 2104  GLUCAP 143* 151* 110* 156* 135*    Recent Results (from the past 240 hour(s))  MRSA PCR SCREENING     Status: None  Collection Time    09/04/13  1:11 PM      Result Value Range Status   MRSA by PCR NEGATIVE  NEGATIVE Final   Comment:            The GeneXpert MRSA Assay (FDA     approved for NASAL specimens     only), is one component of a     comprehensive MRSA colonization     surveillance program. It is not     intended to diagnose MRSA     infection nor to guide or     monitor treatment for     MRSA infections.     Studies: No results found.  Scheduled Meds: . allopurinol  100 mg Oral Daily  . apixaban  5 mg Oral BID  . carvedilol  25 mg Oral BID WC  . digoxin  0.25 mg Oral Daily  . fluticasone  2 spray Each Nare Daily  . furosemide  80 mg Intravenous BID  . hydrALAZINE  12.5 mg Oral Q8H  . insulin aspart  0-9 Units Subcutaneous TID WC  . insulin aspart  6 Units  Subcutaneous TID WC  . isosorbide mononitrate  30 mg Oral Daily  . lisinopril  2.5 mg Oral Daily  . pantoprazole  40 mg Oral Daily  . potassium chloride SA  40 mEq Oral BID  . predniSONE  20 mg Oral Q breakfast  . senna-docusate  2 tablet Oral Daily  . sodium chloride  3 mL Intravenous Q12H   Continuous Infusions:   Active Problems:   Hypertension   COPD (chronic obstructive pulmonary disease)   Diabetes mellitus without complication   CKD (chronic kidney disease), stage III   Nonischemic cardiomyopathy   Left-sided weakness   Acute CHF   Chest pain    Time spent: 25 min    Kabir Brannock  [facility] Pager [phone]. If 7PM-7AM, please contact night-coverage at [email or web address], password [facility] 09/08/2013, 8:47 AM  LOS: 4 days

## 2013-09-09 LAB — GLUCOSE, CAPILLARY: Glucose-Capillary: 207 mg/dL — ABNORMAL HIGH (ref 70–99)

## 2013-09-09 MED ORDER — POTASSIUM CHLORIDE CRYS ER 20 MEQ PO TBCR: 40.0000 meq | EXTENDED_RELEASE_TABLET | Freq: Two times a day (BID) | ORAL | Status: DC

## 2013-09-09 MED ORDER — APIXABAN 5 MG PO TABS: 5.0000 mg | ORAL_TABLET | Freq: Two times a day (BID) | ORAL | Status: DC

## 2013-09-09 MED ORDER — OXYCODONE-ACETAMINOPHEN 5-325 MG PO TABS: 1.0000 | ORAL_TABLET | Freq: Three times a day (TID) | ORAL | Status: DC | PRN

## 2013-09-09 MED ORDER — PRAMOXINE-ZINC OXIDE IN MO 1-12.5 % RE OINT: TOPICAL_OINTMENT | Freq: Two times a day (BID) | RECTAL | Status: DC | PRN

## 2013-09-09 MED ORDER — FUROSEMIDE 80 MG PO TABS: 80.0000 mg | ORAL_TABLET | Freq: Two times a day (BID) | ORAL | Status: DC

## 2013-09-09 MED ORDER — ISOSORBIDE MONONITRATE ER 30 MG PO TB24: 30.0000 mg | ORAL_TABLET | Freq: Every day | ORAL | Status: DC

## 2013-09-09 MED ORDER — LISINOPRIL 2.5 MG PO TABS: 2.5000 mg | ORAL_TABLET | Freq: Every day | ORAL | Status: DC

## 2013-09-09 MED ORDER — HYDRALAZINE HCL 25 MG PO TABS: 12.5000 mg | ORAL_TABLET | Freq: Three times a day (TID) | ORAL | Status: DC

## 2013-09-09 MED ORDER — PANTOPRAZOLE SODIUM 40 MG PO TBEC: 40.0000 mg | DELAYED_RELEASE_TABLET | Freq: Two times a day (BID) | ORAL | Status: DC

## 2013-09-09 MED ORDER — SIMETHICONE 80 MG PO CHEW: 80.0000 mg | CHEWABLE_TABLET | Freq: Four times a day (QID) | ORAL | Status: DC | PRN

## 2013-09-09 MED ORDER — PREDNISONE 10 MG PO TABS: ORAL_TABLET | ORAL | Status: DC

## 2013-09-09 MED ORDER — ALLOPURINOL 100 MG PO TABS: 100.0000 mg | ORAL_TABLET | Freq: Every day | ORAL | Status: DC

## 2013-09-09 MED ORDER — CARVEDILOL 25 MG PO TABS: 25.0000 mg | ORAL_TABLET | Freq: Two times a day (BID) | ORAL | Status: DC

## 2013-09-09 MED ORDER — DIGOXIN 250 MCG PO TABS: 0.2500 mg | ORAL_TABLET | Freq: Every day | ORAL | Status: DC

## 2013-09-09 NOTE — Progress Notes (Signed)
Client and niece verbalizes understanding of discharge instructions.  Front wheel walker with patient that was brought to him from [facility].  Transported via wheelchair to car.  Went home with niece.

## 2013-09-09 NOTE — Progress Notes (Signed)
Client able to complete his bath with minimal assistance.  Telemetry monitoring discontinued.

## 2013-09-09 NOTE — Discharge Summary (Signed)
Physician Discharge Summary  Saahil Herbster ZOX:[id number] DOB: Feb 16, 1951 DOA: 09/04/2013  PCP: Georgann Housekeeper, MD  Admit date: 09/04/2013 Discharge date: 09/09/2013  Time spent: 35 minutes  Recommendations for Outpatient Follow-up:  1. Home health 2. Cbc, bmp 1 week  Discharge Diagnoses:  Active Problems:   Hypertension   COPD (chronic obstructive pulmonary disease)   Diabetes mellitus without complication   CKD (chronic kidney disease), stage III   Nonischemic cardiomyopathy   Left-sided weakness   Acute CHF   Chest pain   Discharge Condition: improved  Diet recommendation: cardiac/diabetic  Filed Weights   09/07/13 0454 09/08/13 0444 09/09/13 0547  Weight: 106.867 kg (235 lb 9.6 oz) 105.008 kg (231 lb 8 oz) 104.2 kg (229 lb 11.5 oz)    History of present illness:  Frank Davidson is a 62 y.o. male with a Past Medical History of atrial fibrillation on Pradaxa, COPD, hypertension, nonischemic cardiomyopathy with an EF around 15%, CVA who presents today with the above noted complaint. Per patient, for the past 5 days he is to have shortness of breath, this is mostly due to exertion. He also gives a history of orthopnea, and claims that he started using 2 pillows over the past few days. He claims he is gained around 10 pounds over the past week. He also claims to have worsening lower extremity edema. He was brought into the emergency room, found to have acute congestive heart failure, I was asked to admit this patient for further evaluation and treatment.  Patient also claims to have some mild chest discomfort for the past 2 days, this isn't associated nausea, vomiting or diaphoresis   Hospital Course:  Acute on chronic systolic CHF - BNP elevated at 5685, coreg, cards following, IV Lasix, strict I/Os, daily weights  -appreciated heart failure team   OP dry weight 229 in September  Switch to oral diuretic regimen, Furosemide 80 mg BID  EF 20%   Acute on Chronic  Respiratory Failure  hemorrhoids  -prep H   CAD with no angina- coreg   HTN - coreg   PAF  -coreg  -dig   CKD (chronic kidney disease), stage III  - stable   COPD (chronic obstructive pulmonary disease)  - Shortness of breath more from congestive heart failure other than COPD. Continue with steroids that the patient was already on prior to this admission, continue with nebulized bronchodilators.   Diabetes mellitus without complication  - Hold glipizide, start sliding scale insulin.   Nonischemic dilated CM   Procedures:  none  Consultations:  Heart failure  Discharge Exam: Filed Vitals:   09/09/13 0700  BP: 120/84  Pulse: 90  Temp:   Resp:     General: A+Ox3, NAD Cardiovascular: rrr Respiratory: clear anterior  Discharge Instructions      Discharge Orders   Future Appointments Provider Department Dept Phone   09/11/2013 2:15 PM Lesleigh [name], MD [facility] [phone]   09/17/2013 3:20 PM [facility] [phone]   Future Orders Complete By Expires   Amb Referral to Cardiac Rehabilitation  As directed        Medication List    ASK your doctor about these medications       allopurinol 100 MG tablet  Commonly known as:  ZYLOPRIM  Take 100 mg by mouth daily.     alum & mag hydroxide-simeth 200-200-20 MG/5ML suspension  Commonly known as:  MAALOX/MYLANTA  Take 30 mLs by mouth  every 4 (four) hours as needed for indigestion.     carvedilol 25 MG tablet  Commonly known as:  COREG  Take 25 mg by mouth 2 (two) times daily with a meal.     dabigatran 75 MG Caps capsule  Commonly known as:  PRADAXA  Take 75 mg by mouth every 12 (twelve) hours.     fluticasone 50 MCG/ACT nasal spray  Commonly known as:  FLONASE  Place 2 sprays into the nose daily. For 21 days starting 08/29/13     furosemide 40 MG tablet  Commonly known as:  LASIX  Take 40 mg by mouth 2 (two) times  daily.     glipiZIDE 5 MG tablet  Commonly known as:  GLUCOTROL  Take 5 mg by mouth 2 (two) times daily before a meal.     insulin aspart 100 UNIT/ML injection  Commonly known as:  novoLOG  Inject 0-8 Units into the skin 3 (three) times daily with meals. Sliding scale: <200=0 units, 201-250=2 units, 251-300=4 units, 301-350=6 units, 351-400=8 units, >400=CALL MD     isosorbide dinitrate 20 MG tablet  Commonly known as:  ISORDIL  Take 20 mg by mouth daily.     levalbuterol 0.63 MG/3ML nebulizer solution  Commonly known as:  XOPENEX  Take 3 mL (0.63 mg total) by nebulization every 6 (six) hours as needed for wheezing or shortness of breath.     loratadine 10 MG tablet  Commonly known as:  CLARITIN  Take 10 mg by mouth daily as needed for allergies.     nitroGLYCERIN 0.4 MG SL tablet  Commonly known as:  NITROSTAT  Place 0.4 mg under the tongue every 5 (five) minutes as needed for chest pain.     oxyCODONE-acetaminophen 5-325 MG per tablet  Commonly known as:  PERCOCET/ROXICET  Take 1 tablet by mouth every 8 (eight) hours as needed for pain.     potassium chloride SA 20 MEQ tablet  Commonly known as:  K-DUR,KLOR-CON  Take 2 tablets (40 mEq total) by mouth daily.     predniSONE 20 MG tablet  Commonly known as:  DELTASONE  Take 2 tablets (40 mg total) by mouth daily with breakfast.     senna-docusate 8.6-50 MG per tablet  Commonly known as:  Senokot-S  Take 2 tablets by mouth daily.       Allergies  Allergen Reactions  . Actos [Pioglitazone] Other (See Comments)    Caused blood in urine and fluid retention    Follow-up Information   Follow up with [facility]. (Home health nurse, physical therapist, and occupational therapist)    Contact information:   Home health has been ordered for you, and set up.  They will contact you in 24-48hours.  If they do NOT call, you can contact them:  [phone]       The results of significant diagnostics from this  hospitalization (including imaging, microbiology, ancillary and laboratory) are listed below for reference.    Significant Diagnostic Studies: Dg Chest Portable 1 View  09/04/2013   CLINICAL DATA:  Shortness of breath for 1 week  EXAM: PORTABLE CHEST - 1 VIEW  COMPARISON:  Chest x-ray of 07/03/2013  FINDINGS: Moderate cardiomegaly is stable. No active infiltrate or effusion is seen. Mediastinal contours are stable. There are diffuse degenerative changes throughout the thoracic spine.  IMPRESSION: Stable moderate cardiomegaly. No active lung disease   Electronically Signed   By: Dwyane Dee M.D.   On: 09/04/2013 09:25  Microbiology: Recent Results (from the past 240 hour(s))  MRSA PCR SCREENING     Status: None   Collection Time    09/04/13  1:11 PM      Result Value Range Status   MRSA by PCR NEGATIVE  NEGATIVE Final   Comment:            The GeneXpert MRSA Assay (FDA     approved for NASAL specimens     only), is one component of a     comprehensive MRSA colonization     surveillance program. It is not     intended to diagnose MRSA     infection nor to guide or     monitor treatment for     MRSA infections.     Labs: Basic Metabolic Panel:  Recent Labs Lab 09/04/13 0955 09/05/13 0135 09/06/13 0520 09/06/13 1227 09/07/13 0510  NA 138 140 139  --  139  K 4.5 4.7 3.8  --  3.9  CL 101 99 97  --  99  CO2 24 26 27   --  29  GLUCOSE 88 203* 114*  --  116*  BUN 32* 42* 40*  --  36*  CREATININE 1.11 1.24 1.11  --  1.18  CALCIUM 9.9 9.4 9.5  --  9.5  MG  --   --   --  2.3  --    Liver Function Tests: No results found for this basename: AST, ALT, ALKPHOS, BILITOT, PROT, ALBUMIN,  in the last 168 hours No results found for this basename: LIPASE, AMYLASE,  in the last 168 hours No results found for this basename: AMMONIA,  in the last 168 hours CBC:  Recent Labs Lab 09/04/13 0955 09/07/13 0510  WBC 12.1* 13.0*  HGB 15.8 15.8  HCT 46.9 45.4  MCV 80.3 79.8  PLT 99*  124*   Cardiac Enzymes:  Recent Labs Lab 09/04/13 1410 09/04/13 1740 09/05/13 0135  TROPONINI <0.30 <0.30 <0.30   BNP: BNP (last 3 results)  Recent Labs  09/04/13 0955  PROBNP 5685.0*   CBG:  Recent Labs Lab 09/08/13 0750 09/08/13 1121 09/08/13 1557 09/08/13 2133 09/09/13 0703  GLUCAP 119* 206* 147* 116* 157*       Signed:  Naseem Adler  [facility] 09/09/2013, 9:00 AM

## 2013-09-11 ENCOUNTER — Ambulatory Visit: Payer: Medicare Other | Admitting: Interventional Cardiology

## 2013-09-14 ENCOUNTER — Telehealth (HOSPITAL_COMMUNITY): Payer: Self-pay | Admitting: Cardiology

## 2013-09-14 NOTE — Telephone Encounter (Signed)
Requesting Rx fpr Dm testing supplies Pt does still use [facility]

## 2013-09-17 ENCOUNTER — Telehealth (HOSPITAL_COMMUNITY): Payer: Self-pay | Admitting: Adult Health

## 2013-09-17 ENCOUNTER — Encounter (HOSPITAL_COMMUNITY): Payer: Self-pay

## 2013-09-17 ENCOUNTER — Ambulatory Visit (HOSPITAL_COMMUNITY)
Admit: 2013-09-17 | Discharge: 2013-09-17 | Disposition: A | Discharge status: Home / Self Care | Payer: Medicare Other | Source: Ambulatory Visit | Attending: Internal Medicine | Admitting: Internal Medicine

## 2013-09-17 VITALS — BP 112/84 | HR 74 | Ht 70.0 in | Wt 223.8 lb

## 2013-09-17 DIAGNOSIS — M109 Gout, unspecified: Secondary | ICD-10-CM

## 2013-09-17 DIAGNOSIS — I4891 Unspecified atrial fibrillation: Secondary | ICD-10-CM

## 2013-09-17 DIAGNOSIS — I5022 Chronic systolic (congestive) heart failure: Secondary | ICD-10-CM

## 2013-09-17 LAB — BASIC METABOLIC PANEL
BUN: 26 mg/dL — ABNORMAL HIGH (ref 6–23)
CO2: 21 mEq/L (ref 19–32)
Calcium: 9.9 mg/dL (ref 8.4–10.5)
Chloride: 94 mEq/L — ABNORMAL LOW (ref 96–112)
Creatinine, Ser: 1.41 mg/dL — ABNORMAL HIGH (ref 0.50–1.35)
Glucose, Bld: 127 mg/dL — ABNORMAL HIGH (ref 70–99)
Potassium: 4.4 mEq/L (ref 3.5–5.1)

## 2013-09-17 LAB — PRO B NATRIURETIC PEPTIDE: Pro B Natriuretic peptide (BNP): 2256 pg/mL — ABNORMAL HIGH (ref 0–125)

## 2013-09-17 MED ORDER — DIGOXIN 250 MCG PO TABS: 0.1250 mg | ORAL_TABLET | Freq: Every day | ORAL | Status: DC

## 2013-09-17 MED ORDER — LISINOPRIL 2.5 MG PO TABS: 2.5000 mg | ORAL_TABLET | Freq: Every day | ORAL | Status: DC

## 2013-09-17 MED ORDER — PREDNISONE 10 MG PO TABS: 40.0000 mg | ORAL_TABLET | Freq: Every day | ORAL | Status: DC

## 2013-09-17 NOTE — Patient Instructions (Addendum)
Take lisinopril 2.5 mg twice a day  Take an extra 40 mg of lasix if your goes up 3 pounds.   Take prednisone 40 mg daily for 3 days  Do the following things EVERYDAY: 1) Weigh yourself in the morning before breakfast. Write it down and keep it in a log. 2) Take your medicines as prescribed 3) Eat low salt foods-Limit salt (sodium) to 2000 mg per day.  4) Stay as active as you can everyday 5) Limit all fluids for the day to less than 2 liters  [facility] to check BMET next week   Follow up in 2 weeks

## 2013-09-17 NOTE — Progress Notes (Signed)
Patient ID: Frank Davidson, male   DOB: Jul 19, 1951, 62 y.o.   MRN: [id number]  Weight Range  229 pounds   Baseline proBNP     HPI: Frank Davidson is a 62 yo male with a hx of obesity, atrial fibrillation (paroxysmal), HTN, COPD, HTN, gout, DM 2, CVA and NICM with EF 20-25% (06/2013).   Has previously followed in the [facility] in [place]. Previous cath in 2007 showed NICM with normal cors by report. Was living alone in [place] and apparently was issues managing medications by himself and was getting confused with PCP and cardiologists medications and was not taking his meds correctly. So he was moved to [facility] this summer to be in [place] near his nieces. Has not been on ACE-I due to renal failure. Highest creatinine in our system is ~1.5 but was apparently higher previously - thought to be related to taking his medications incorrectly.   Was admitted in 06/2013 from [facility] with left sided weakness and was found to have a moderate to lg sized right hemispheric infarct. Went back to [employer] [facility].   Admitted to [facility] 10/14 through 09/09/13 with ADHF. Diuresed 30 pounds with IV lasix and transitioned to lasix 80 mg po bid. Continued on 25 mg carvedilol bid, isordil 20 mg daily.  Started on lisinopril 2.5 mg daily. Discharge weight 229 pound.    He returns for post hospital follow up. Overall feeling much better. Though still SOB ambulating into the clinic.  Denies dizziness.  Ambulates with a walker. Complaining of L elbow pain due to gout flare. He has not been weighing at home because he does not have a scale. He limits his fluid to 1 liters per day. Follows low salt diet. Followed by [facility] RN/PT. Compliant with medications.    Labs 09/07/13 K 3.9 Creatinine 1.18    SH: Retired Engineer, site 2011 Lives with his niece  FH: Father DM  Mom had lung cancer    ROS: All systems negative except as listed in HPI, PMH and Problem List.  Past Medical History  Diagnosis Date  .  Fibromyalgia   . Atrial fibrillation   . Hypertension   . Osteoarthritis   . COPD (chronic obstructive pulmonary disease)   . Sleep apnea with use of continuous positive airway pressure (CPAP)   . Diabetes mellitus without complication   . Coronary artery disease   . Neuropathy   . Ocular herpes   . Gout   . CKD (chronic kidney disease), stage III   . CHF (congestive heart failure)     EF 15%  . Polysubstance abuse     Hx of  . Chronic pain   . Hyperlipidemia   . Nonischemic cardiomyopathy   . Stroke   . Ocular herpes   . Neuropathy   . Left-sided weakness   . Acute on chronic systolic heart failure     Current Outpatient Prescriptions  Medication Sig Dispense Refill  . allopurinol (ZYLOPRIM) 100 MG tablet Take 1 tablet (100 mg total) by mouth daily.  30 tablet  0  . alum & mag hydroxide-simeth (MAALOX/MYLANTA) 200-200-20 MG/5ML suspension Take 30 mLs by mouth every 4 (four) hours as needed for indigestion.       Marland Kitchen apixaban (ELIQUIS) 5 MG TABS tablet Take 1 tablet (5 mg total) by mouth 2 (two) times daily.  60 tablet  0  . carvedilol (COREG) 25 MG tablet Take 1 tablet (25 mg total) by mouth 2 (two) times daily with a meal.  60 tablet  0  . digoxin (LANOXIN) 0.25 MG tablet Take 1 tablet (0.25 mg total) by mouth daily.  30 tablet  0  . furosemide (LASIX) 80 MG tablet Take 1 tablet (80 mg total) by mouth 2 (two) times daily.  60 tablet  0  . glipiZIDE (GLUCOTROL) 5 MG tablet Take 5 mg by mouth 2 (two) times daily before a meal.      . hydrALAZINE (APRESOLINE) 25 MG tablet Take 0.5 tablets (12.5 mg total) by mouth every 8 (eight) hours.  90 tablet  0  . isosorbide mononitrate (IMDUR) 30 MG 24 hr tablet Take 1 tablet (30 mg total) by mouth daily.  30 tablet  0  . lisinopril (PRINIVIL,ZESTRIL) 2.5 MG tablet Take 1 tablet (2.5 mg total) by mouth daily.  30 tablet  0  . oxyCODONE-acetaminophen (PERCOCET/ROXICET) 5-325 MG per tablet Take 1 tablet by mouth every 8 (eight) hours as needed.   30 tablet  0  . pantoprazole (PROTONIX) 40 MG tablet Take 1 tablet (40 mg total) by mouth 2 (two) times daily.  60 tablet  0  . potassium chloride SA (K-DUR,KLOR-CON) 20 MEQ tablet Take 2 tablets (40 mEq total) by mouth 2 (two) times daily.  60 tablet  0  . senna-docusate (SENOKOT-S) 8.6-50 MG per tablet Take 2 tablets by mouth daily.      . simethicone (MYLICON) 80 MG chewable tablet Chew 1 tablet (80 mg total) by mouth 4 (four) times daily as needed for flatulence.  30 tablet  0   No current facility-administered medications for this encounter.     PHYSICAL EXAM: Filed Vitals:   09/17/13 1459  BP: 112/84  Pulse: 74  Height: 5\' 10"  (1.778 m)  Weight: 223 lb 12.8 oz (101.515 kg)  SpO2: 100%    General:  Well appearing. No resp difficulty HEENT: normal Neck: supple. JVP 5-6. Carotids 2+ bilaterally; no bruits. No lymphadenopathy or thryomegaly appreciated. Cor: PMI normal. Irregular  rate & rhythm. No rubs, gallops or murmurs. Lungs: clear Abdomen: obese soft, nontender, nondistended. No hepatosplenomegaly. No bruits or masses. Good bowel sounds. Extremities: no cyanosis, clubbing, rash, edema Neuro: alert & orientedx3, cranial nerves grossly intact. LLE is weak. Affect pleasant.    ASSESSMENT & PLAN: 1)Chronic Systolic EF 20-25% NICM . NYHA III -Volume status stable. Continue lasix 80 mg twice a day -Continue carvedilol 25 mg twice a day.  -Continue lisinopril 2.5 mg in am and add 2.5 mg in pm -Continue hydralazine 12.5 mg tid and Isordil 20 mg  - Provided with a scale and weight chart. Reinforced daily weights, low salt food choices, and limiting fluid intake to < 2 liters per day.  Check BMET, Pro BNP, and Dig level.. [facility] to recheck BMET next week.   2) HTN - Soft. Continue current regimen.   3) h/o CVA   4) Acute, Gout. Continue allopurinol. Give 40 mg prednisone for 3 days. He was followed closely by [facility] rheumatologist. Refer to rheumatology in [place].   5) DM2 -  Per PCP  6) Atrial fibrillation, on eliquis 5 mg bid.   7) Chronic renal failure, stage III Check BMET  Follow up in 2 weeks.   CLEGG,AMY 4:48 PM  Patient seen and examined with Tonye Becket, NP. We discussed all aspects of the encounter. I agree with the assessment and plan as stated above. Overall improved since hospitalization. Weight is stable and volume looks good. NYHA III. Agree with titrating ACE-I. Needs continued rehab. Reinforced need  for daily weights and reviewed use of sliding scale diuretics.Treat gout with prednisone and f/u with rheum. Needs continued rehab from CVA. Discussed GUIDE-IT trial and he is considering enrollment.   Yuna Pizzolato,MD 11:45 PM

## 2013-09-17 NOTE — Telephone Encounter (Signed)
  Attempted to provide message but his phone would not accept messages. Will call back tomorrow.   Dig level 1.7 Creatinine 1.4 K 4.4   Will need to instruct him to cut digoxin back to 0.125 mg daily   Will notify [facility] to follow up and repeat BMET and dig level next week.   CLEGG,AMY 4:56 PM

## 2013-09-18 NOTE — Telephone Encounter (Signed)
Attempted to call pt, can not leave message, will try again later

## 2013-09-18 NOTE — Telephone Encounter (Signed)
Advised at OV to get from pcp

## 2013-09-19 ENCOUNTER — Telehealth: Payer: Self-pay | Admitting: Interventional Cardiology

## 2013-09-19 NOTE — Telephone Encounter (Signed)
returned [facility] nurse call. adv her after speaking with Dr.Smith pt will need to establish care with a pcp.suggested that [facility] help pt with facilitating  pt with a pcp. Vanessa verbalized undersatnding.

## 2013-09-19 NOTE — Telephone Encounter (Signed)
New Problem  Frank Davidson with [facility] called. States the pt has had a 5 day history of burning urination// pressure// urgency to urinate w/ Foul odor, cloudy in appearance. 1 week history of Gout pains to the right elbow // requests pain medications . Also the Pt is a diabetic w/out diabetic supplies or a Glucometer.Marland Kitchen No PCP  She also asks if you will need her to collect a urine sample/// Please call back to advise.

## 2013-09-20 ENCOUNTER — Ambulatory Visit (INDEPENDENT_AMBULATORY_CARE_PROVIDER_SITE_OTHER): Payer: Medicare Other | Admitting: Emergency Medicine

## 2013-09-20 VITALS — BP 100/68 | HR 64 | Temp 97.8°F | Resp 18 | Ht 70.0 in | Wt 220.0 lb

## 2013-09-20 DIAGNOSIS — R300 Dysuria: Secondary | ICD-10-CM

## 2013-09-20 DIAGNOSIS — N3000 Acute cystitis without hematuria: Secondary | ICD-10-CM

## 2013-09-20 LAB — POCT URINALYSIS DIPSTICK
Glucose, UA: NEGATIVE
Leukocytes, UA: NEGATIVE
Protein, UA: NEGATIVE
Spec Grav, UA: 1.015
Urobilinogen, UA: 0.2

## 2013-09-20 LAB — POCT UA - MICROSCOPIC ONLY: Yeast, UA: NEGATIVE

## 2013-09-20 MED ORDER — PHENAZOPYRIDINE HCL 200 MG PO TABS: 200.0000 mg | ORAL_TABLET | Freq: Three times a day (TID) | ORAL | Status: DC | PRN

## 2013-09-20 MED ORDER — SULFAMETHOXAZOLE-TRIMETHOPRIM 800-160 MG PO TABS: 1.0000 | ORAL_TABLET | Freq: Two times a day (BID) | ORAL | Status: DC

## 2013-09-20 NOTE — Progress Notes (Signed)
[facility] [address] [place] [address] [phone]  Date:  09/20/2013   Name:  Frank Davidson   DOB:  Apr 12, 1951   MRN:  [id number]  PCP:  Georgann Housekeeper, MD    Chief Complaint: Dysuria and Urinary Frequency   History of Present Illness:  Frank Davidson is a 62 y.o. very pleasant male patient who presents with the following:  Out of hospital for a week following stay for CHF.  Now has dysuria, urgency and frequency.  No fever or chills. No nausea or vomiting.  No catheterization during hospital stay.  No improvement with over the counter medications or other home remedies. Denies other complaint or health concern today.   Patient Active Problem List   Diagnosis Date Noted  . Acute on chronic systolic heart failure   . Acute CHF 09/04/2013  . Chest pain 09/04/2013  . Ocular herpes   . Neuropathy   . Left-sided weakness   . Acute on chronic diastolic CHF (congestive heart failure), NYHA class 3 07/09/2013  . Acute ischemic stroke 07/09/2013  . OSA (obstructive sleep apnea) 07/09/2013  . Gout attack 07/07/2013  . Chronic systolic heart failure 07/05/2013  . Stroke 07/03/2013  . CVA (cerebral infarction) 07/03/2013  . A-fib 07/03/2013  . Hypokalemia 07/03/2013  . Hypertension   . COPD (chronic obstructive pulmonary disease)   . Diabetes mellitus without complication   . CKD (chronic kidney disease), stage III   . CHF (congestive heart failure)   . Nonischemic cardiomyopathy   . Polysubstance abuse     Past Medical History  Diagnosis Date  . Fibromyalgia   . Atrial fibrillation   . Hypertension   . Osteoarthritis   . COPD (chronic obstructive pulmonary disease)   . Sleep apnea with use of continuous positive airway pressure (CPAP)   . Diabetes mellitus without complication   . Coronary artery disease   . Neuropathy   . Ocular herpes   . Gout   . CKD (chronic kidney disease), stage III   . CHF (congestive heart failure)     EF  15%  . Polysubstance abuse     Hx of  . Chronic pain   . Hyperlipidemia   . Nonischemic cardiomyopathy   . Stroke   . Ocular herpes   . Neuropathy   . Left-sided weakness   . Acute on chronic systolic heart failure     Past Surgical History  Procedure Laterality Date  . 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 Cardiac catheterization  10/2006    normal coronary arteries    History  Substance Use Topics  . Smoking status: Former Smoker    Quit date: 12/18/2006  . Smokeless tobacco: Not on file  . Alcohol Use: No    Family History  Problem Relation Age of Onset  . Cancer Mother   . Hypertension Mother   . Diabetes Father   . Diabetes Sister   . Hypertension Sister   . Hypertension Brother   . Hypertension Sister     Allergies  Allergen Reactions  . Actos [Pioglitazone] Other (See Comments)    Caused blood in urine and fluid retention     Medication list has been reviewed and updated.  Current Outpatient Prescriptions on File Prior to Visit  Medication Sig Dispense Refill  . allopurinol (ZYLOPRIM) 100 MG tablet Take 1 tablet (100 mg total) by mouth daily.  30 tablet  0  . apixaban (  ELIQUIS) 5 MG TABS tablet Take 1 tablet (5 mg total) by mouth 2 (two) times daily.  60 tablet  0  . carvedilol (COREG) 25 MG tablet Take 1 tablet (25 mg total) by mouth 2 (two) times daily with a meal.  60 tablet  0  . digoxin (LANOXIN) 0.25 MG tablet Take 0.5 tablets (0.125 mg total) by mouth daily.  30 tablet  0  . furosemide (LASIX) 80 MG tablet Take 1 tablet (80 mg total) by mouth 2 (two) times daily.  60 tablet  0  . hydrALAZINE (APRESOLINE) 25 MG tablet Take 0.5 tablets (12.5 mg total) by mouth every 8 (eight) hours.  90 tablet  0  . isosorbide mononitrate (IMDUR) 30 MG 24 hr tablet Take 1 tablet (30 mg total) by mouth daily.  30 tablet  0  . lisinopril (PRINIVIL,ZESTRIL) 2.5 MG tablet Take 1 tablet (2.5 mg total) by mouth daily.  60 tablet  6  . oxyCODONE-acetaminophen  (PERCOCET/ROXICET) 5-325 MG per tablet Take 1 tablet by mouth every 8 (eight) hours as needed.  30 tablet  0  . pantoprazole (PROTONIX) 40 MG tablet Take 1 tablet (40 mg total) by mouth 2 (two) times daily.  60 tablet  0  . potassium chloride SA (K-DUR,KLOR-CON) 20 MEQ tablet Take 2 tablets (40 mEq total) by mouth 2 (two) times daily.  60 tablet  0  . predniSONE (DELTASONE) 10 MG tablet Take 4 tablets (40 mg total) by mouth daily.  12 tablet  0  . senna-docusate (SENOKOT-S) 8.6-50 MG per tablet Take 2 tablets by mouth daily.      . simethicone (MYLICON) 80 MG chewable tablet Chew 1 tablet (80 mg total) by mouth 4 (four) times daily as needed for flatulence.  30 tablet  0  . glipiZIDE (GLUCOTROL) 5 MG tablet Take 5 mg by mouth 2 (two) times daily before a meal.       No current facility-administered medications on file prior to visit.    Review of Systems:  As per HPI, otherwise negative.    Physical Examination: Filed Vitals:   09/20/13 2011  BP: 100/68  Pulse: 64  Temp: 97.8 F (36.6 C)  Resp: 18   Filed Vitals:   09/20/13 2011  Height: 5\' 10"  (1.778 m)  Weight: 220 lb (99.791 kg)   Body mass index is 31.57 kg/(m^2). Ideal Body Weight: Weight in (lb) to have BMI = 25: 173.9  GEN: obese, NAD, Non-toxic, A & O x 3 HEENT: Atraumatic, Normocephalic. Neck supple. No masses, No LAD. Ears and Nose: No external deformity. CV: RRR, No M/G/R. No JVD. No thrill. No extra heart sounds. PULM: CTA B, no wheezes, crackles, rhonchi. No retractions. No resp. distress. No accessory muscle use. ABD: S, NT, ND, +BS. No rebound. No HSM. EXTR: No c/c/e NEURO Normal gait.  PSYCH: Normally interactive. Conversant. Not depressed or anxious appearing.  Calm demeanor.    Assessment and Plan: Cystitis Septra Pyridium  Signed,  Phillips Odor, MD   Results for orders placed in visit on 09/20/13  POCT URINALYSIS DIPSTICK      Result Value Range   Color, UA yellow     Clarity, UA clear      Glucose, UA neg     Bilirubin, UA small     Ketones, UA neg     Spec Grav, UA 1.015     Blood, UA neg     pH, UA 5.0     Protein, UA neg  Urobilinogen, UA 0.2     Nitrite, UA neg     Leukocytes, UA Negative    POCT UA - MICROSCOPIC ONLY      Result Value Range   WBC, Ur, HPF, POC 4-6     RBC, urine, microscopic 0-2     Bacteria, U Microscopic 1+     Mucus, UA neg     Epithelial cells, urine per micros 1-3     Crystals, Ur, HPF, POC neg     Casts, Ur, LPF, POC neg     Yeast, UA neg

## 2013-09-20 NOTE — Patient Instructions (Signed)
Urinary Tract Infection  Urinary tract infections (UTIs) can develop anywhere along your urinary tract. Your urinary tract is your body's drainage system for removing wastes and extra water. Your urinary tract includes two kidneys, two ureters, a bladder, and a urethra. Your kidneys are a pair of bean-shaped organs. Each kidney is about the size of your fist. They are located below your ribs, one on each side of your spine.  CAUSES  Infections are caused by microbes, which are microscopic organisms, including fungi, viruses, and bacteria. These organisms are so small that they can only be seen through a microscope. Bacteria are the microbes that most commonly cause UTIs.  SYMPTOMS   Symptoms of UTIs may vary by age and gender of the patient and by the location of the infection. Symptoms in young women typically include a frequent and intense urge to urinate and a painful, burning feeling in the bladder or urethra during urination. Older women and men are more likely to be tired, shaky, and weak and have muscle aches and abdominal pain. A fever may mean the infection is in your kidneys. Other symptoms of a kidney infection include pain in your back or sides below the ribs, nausea, and vomiting.  DIAGNOSIS  To diagnose a UTI, your caregiver will ask you about your symptoms. Your caregiver also will ask to provide a urine sample. The urine sample will be tested for bacteria and white blood cells. White blood cells are made by your body to help fight infection.  TREATMENT   Typically, UTIs can be treated with medication. Because most UTIs are caused by a bacterial infection, they usually can be treated with the use of antibiotics. The choice of antibiotic and length of treatment depend on your symptoms and the type of bacteria causing your infection.  HOME CARE INSTRUCTIONS   If you were prescribed antibiotics, take them exactly as your caregiver instructs you. Finish the medication even if you feel better after you  have only taken some of the medication.   Drink enough water and fluids to keep your urine clear or pale yellow.   Avoid caffeine, tea, and carbonated beverages. They tend to irritate your bladder.   Empty your bladder often. Avoid holding urine for long periods of time.   Empty your bladder before and after sexual intercourse.   After a bowel movement, women should cleanse from front to back. Use each tissue only once.  SEEK MEDICAL CARE IF:    You have back pain.   You develop a fever.   Your symptoms do not begin to resolve within 3 days.  SEEK IMMEDIATE MEDICAL CARE IF:    You have severe back pain or lower abdominal pain.   You develop chills.   You have nausea or vomiting.   You have continued burning or discomfort with urination.  MAKE SURE YOU:    Understand these instructions.   Will watch your condition.   Will get help right away if you are not doing well or get worse.  Document Released: 08/18/2005 Document Revised: 05/09/2012 Document Reviewed: 12/17/2011  [employer] Patient Information 2014 [employer].

## 2013-09-21 ENCOUNTER — Telehealth: Payer: Self-pay

## 2013-09-21 DIAGNOSIS — E119 Type 2 diabetes mellitus without complications: Secondary | ICD-10-CM

## 2013-09-21 NOTE — Telephone Encounter (Signed)
Still unable to reach patient, message sent to [facility] to notify pt to decrease med and recheck labs next week

## 2013-09-21 NOTE — Telephone Encounter (Signed)
notified pt homemealth nurse.([facility]) ref has been placed for pcp. [facility]. she verbalized understanding.

## 2013-09-21 NOTE — Telephone Encounter (Signed)
New problem     Patient does not have a PCP.    Need a referral to [facility] outpatient clinic

## 2013-09-25 ENCOUNTER — Ambulatory Visit (INDEPENDENT_AMBULATORY_CARE_PROVIDER_SITE_OTHER): Payer: Medicare Other | Admitting: Family Medicine

## 2013-09-25 VITALS — BP 120/72 | HR 55 | Temp 97.7°F | Resp 16 | Ht 68.0 in | Wt 216.0 lb

## 2013-09-25 DIAGNOSIS — R197 Diarrhea, unspecified: Secondary | ICD-10-CM

## 2013-09-25 DIAGNOSIS — N390 Urinary tract infection, site not specified: Secondary | ICD-10-CM

## 2013-09-25 DIAGNOSIS — R109 Unspecified abdominal pain: Secondary | ICD-10-CM

## 2013-09-25 DIAGNOSIS — R1310 Dysphagia, unspecified: Secondary | ICD-10-CM

## 2013-09-25 LAB — POCT CBC
Granulocyte percent: 71.6 %G (ref 37–80)
HCT, POC: 54 % — AB (ref 43.5–53.7)
Hemoglobin: 17.4 g/dL (ref 14.1–18.1)
Lymph, poc: 2 (ref 0.6–3.4)
MCH, POC: 28.2 pg (ref 27–31.2)
MCHC: 32.2 g/dL (ref 31.8–35.4)
MCV: 87.5 fL (ref 80–97)
MID (cbc): 0.9 (ref 0–0.9)
MPV: 10.3 fL (ref 0–99.8)
POC Granulocyte: 7.3 — AB (ref 2–6.9)
POC LYMPH PERCENT: 19.5 %L (ref 10–50)
POC MID %: 8.9 %M (ref 0–12)
Platelet Count, POC: 301 10*3/uL (ref 142–424)
RBC: 6.17 M/uL — AB (ref 4.69–6.13)
RDW, POC: 21 %
WBC: 10.2 10*3/uL (ref 4.6–10.2)

## 2013-09-25 LAB — POCT URINALYSIS DIPSTICK
Bilirubin, UA: NEGATIVE
Blood, UA: NEGATIVE
Glucose, UA: NEGATIVE
Ketones, UA: NEGATIVE
Leukocytes, UA: NEGATIVE
Nitrite, UA: NEGATIVE
Protein, UA: NEGATIVE
Spec Grav, UA: 1.02
Urobilinogen, UA: 0.2
pH, UA: 5.5

## 2013-09-25 LAB — POCT UA - MICROSCOPIC ONLY
Casts, Ur, LPF, POC: NEGATIVE
Crystals, Ur, HPF, POC: NEGATIVE
Mucus, UA: NEGATIVE
WBC, Ur, HPF, POC: NEGATIVE
Yeast, UA: NEGATIVE

## 2013-09-25 MED ORDER — METRONIDAZOLE 250 MG PO TABS: 250.0000 mg | ORAL_TABLET | Freq: Three times a day (TID) | ORAL | Status: DC

## 2013-09-25 NOTE — Progress Notes (Signed)
62 yo married gentleman with UTI last week treated with Septra,  He was having abdominal cramps at the time which have worsened. He notes his abdomen today is cramping and he has had loose stools. States pain is constant.   -diabetes:  Uncertain control --> no meter, no doctor -CKD:  Reaction to Actos with most recent GFR 15 -gout: right elbow still sore -CHF:  Still retaining fluid;  Began 10 years ago.  Recently treated at [facility] for this as lasix dose was reduced to 40 mg, now has resumed the lasix at the former 80 mg dose and has improved Former smoker    Objective:  NAD Skin is dry Lower extremities:  Trace edema Lungs clear Walking with walker, has had history of CVA , this affected his speech.   Results for orders placed in visit on 09/25/13  POCT CBC      Result Value Range   WBC 10.2  4.6 - 10.2 K/uL   Lymph, poc 2.0  0.6 - 3.4   POC LYMPH PERCENT 19.5  10 - 50 %L   MID (cbc) 0.9  0 - 0.9   POC MID % 8.9  0 - 12 %M   POC Granulocyte 7.3 (*) 2 - 6.9   Granulocyte percent 71.6  37 - 80 %G   RBC 6.17 (*) 4.69 - 6.13 M/uL   Hemoglobin 17.4  14.1 - 18.1 g/dL   HCT, POC 16.1 (*) 09.6 - 53.7 %   MCV 87.5  80 - 97 fL   MCH, POC 28.2  27 - 31.2 pg   MCHC 32.2  31.8 - 35.4 g/dL   RDW, POC 04.5     Platelet Count, POC 301  142 - 424 K/uL   MPV 10.3  0 - 99.8 fL  POCT UA - MICROSCOPIC ONLY      Result Value Range   WBC, Ur, HPF, POC neg     RBC, urine, microscopic 0-1     Bacteria, U Microscopic small     Mucus, UA neg     Epithelial cells, urine per micros 0-1     Crystals, Ur, HPF, POC neg     Casts, Ur, LPF, POC neg     Yeast, UA neg    POCT URINALYSIS DIPSTICK      Result Value Range   Color, UA yellow     Clarity, UA clear     Glucose, UA neg     Bilirubin, UA neg     Ketones, UA neg     Spec Grav, UA 1.020     Blood, UA neg     pH, UA 5.5     Protein, UA neg     Urobilinogen, UA 0.2     Nitrite, UA neg     Leukocytes, UA Negative     Borborygmi heard  across room, no HSM, nontender  Assessment:  UTI (urinary tract infection) - Plan: POCT CBC, POCT UA - Microscopic Only, POCT urinalysis dipstick, Comprehensive metabolic panel Possible C. Diff. --> Flagyl 250 tid x 7days Call INB in 48 hours. Signed, [name], MD

## 2013-09-25 NOTE — Patient Instructions (Signed)
Stop the antibiotic now/ start flagyl. Call in 2 days to let us know if you are better.    Clostridium Difficile Infection Clostridium difficile (C. diff) is a bacteria found in the intestinal tract or colon. Under certain conditions, it causes diarrhea and sometimes severe disease. The severe form of the disease is known as pseudomembranous colitis (often called C. diff colitis). This disease can damage the lining of the colon or cause the colon to become enlarged (toxic megacolon).  CAUSES  Your colon normally contains many different bacteria, including C. diff. The balance of bacteria in your colon can change during illness. This is especially true when you take antibiotic medicine. Taking antibiotics may allow the C. diff to grow, multiply excessively, and make a toxin that then causes illness. The elderly and people with certain medical conditions have a greater risk of getting C. diff infections. SYMPTOMS   Watery diarrhea.  Fever.  Fatigue.  Loss of appetite.  Nausea.  Abdominal swelling, pain, or tenderness.  Dehydration. DIAGNOSIS  Your symptoms may make your caregiver suspicious of a C. diff infection, especially if you have used antibiotics in the preceding weeks. However, there are only 2 ways to know for certain whether you have a C. diff infection:  A lab test that finds the toxin in your stool.  The specific appearance of an abnormality (pseudomembrane) in your colon. This can only be seen by doing a sigmoidoscopy or colonoscopy. These procedures involve passing an instrument through your rectum to look at the inside of your colon. Your caregiver will help determine if these tests are necessary. TREATMENT   Most people are successfully treated with 1 of 2 specific antibiotics, usually given by mouth. Other antibiotics you are receiving are stopped if possible.  Intravenous (IV) fluids and correction of electrolyte imbalance may be necessary.  Rarely, surgery may be  needed to remove the infected part of the intestines.  Careful hand washing by you and your caregivers is important to prevent the spread of infection. In the hospital, your caregivers may also put on gowns and gloves to prevent the spread of the C. diff bacteria. Your room is also cleaned regularly with a hospital grade disinfectant. HOME CARE INSTRUCTIONS  Drink enough fluids to keep your urine clear or pale yellow. Avoid milk, caffeine, and alcohol.  Ask your caregiver for specific rehydration instructions.  Try eating small, frequent meals rather than large meals.  Take your antibiotics as directed. Finish them even if you start to feel better.  Do not use medicines to slow diarrhea. This could delay healing or cause complications.  Wash your hands thoroughly after using the bathroom and before preparing food.  Make sure people who live with you wash their hands often, too. SEEK MEDICAL CARE IF:  Diarrhea persists longer than expected or recurs after completing your course of antibiotic treatment for the C. diff infection.  You have trouble staying hydrated. SEEK IMMEDIATE MEDICAL CARE IF:  You develop a new fever.  You have increasing abdominal pain or tenderness.  There is blood in your stools, or your stools are dark black and tarry.  You cannot hold down food or liquids. MAKE SURE YOU:   Understand these instructions.  Will watch your condition.  Will get help right away if you are not doing well or get worse. Document Released: 08/18/2005 Document Revised: 01/31/2012 Document Reviewed: 04/16/2011 [facility] Patient Information 2014 [place], [place].

## 2013-09-26 LAB — COMPREHENSIVE METABOLIC PANEL
ALT: 39 U/L (ref 0–53)
AST: 35 U/L (ref 0–37)
Albumin: 4.3 g/dL (ref 3.5–5.2)
Alkaline Phosphatase: 91 U/L (ref 39–117)
BUN: 37 mg/dL — ABNORMAL HIGH (ref 6–23)
CO2: 26 mEq/L (ref 19–32)
Calcium: 10.8 mg/dL — ABNORMAL HIGH (ref 8.4–10.5)
Chloride: 96 mEq/L (ref 96–112)
Creat: 2.57 mg/dL — ABNORMAL HIGH (ref 0.50–1.35)
Glucose, Bld: 119 mg/dL — ABNORMAL HIGH (ref 70–99)
Potassium: 6.1 mEq/L — ABNORMAL HIGH (ref 3.5–5.3)
Sodium: 132 mEq/L — ABNORMAL LOW (ref 135–145)
Total Bilirubin: 0.4 mg/dL (ref 0.3–1.2)
Total Protein: 8 g/dL (ref 6.0–8.3)

## 2013-09-27 ENCOUNTER — Telehealth: Payer: Self-pay

## 2013-09-27 DIAGNOSIS — R1013 Epigastric pain: Secondary | ICD-10-CM

## 2013-09-27 NOTE — Telephone Encounter (Signed)
Pt is calling to give Dr L and update on how he is doing Call back number is [phone]

## 2013-09-27 NOTE — Telephone Encounter (Signed)
Patient returned call. Please try patient again. [phone] Patient stated may leave msg. With niece

## 2013-09-27 NOTE — Telephone Encounter (Signed)
Pt is wanting to update his call back number is [phone]

## 2013-09-27 NOTE — Telephone Encounter (Signed)
Called, he indicates he is not getting much better, he still has GI upset and diarrhea.

## 2013-09-28 ENCOUNTER — Ambulatory Visit (INDEPENDENT_AMBULATORY_CARE_PROVIDER_SITE_OTHER): Payer: Medicare Other | Admitting: Family Medicine

## 2013-09-28 ENCOUNTER — Ambulatory Visit: Payer: Medicare Other

## 2013-09-28 ENCOUNTER — Telehealth: Payer: Self-pay | Admitting: Radiology

## 2013-09-28 ENCOUNTER — Other Ambulatory Visit: Payer: Self-pay | Admitting: Family Medicine

## 2013-09-28 VITALS — BP 128/68 | HR 63 | Temp 97.5°F | Resp 16 | Ht 68.0 in | Wt 216.0 lb

## 2013-09-28 DIAGNOSIS — R109 Unspecified abdominal pain: Secondary | ICD-10-CM

## 2013-09-28 DIAGNOSIS — E1165 Type 2 diabetes mellitus with hyperglycemia: Secondary | ICD-10-CM

## 2013-09-28 DIAGNOSIS — N189 Chronic kidney disease, unspecified: Secondary | ICD-10-CM

## 2013-09-28 DIAGNOSIS — R1319 Other dysphagia: Secondary | ICD-10-CM

## 2013-09-28 DIAGNOSIS — R197 Diarrhea, unspecified: Secondary | ICD-10-CM

## 2013-09-28 LAB — COMPREHENSIVE METABOLIC PANEL
Albumin: 4 g/dL (ref 3.5–5.2)
BUN: 24 mg/dL — ABNORMAL HIGH (ref 6–23)
CO2: 24 mEq/L (ref 19–32)
Calcium: 10.2 mg/dL (ref 8.4–10.5)
Chloride: 95 mEq/L — ABNORMAL LOW (ref 96–112)
Creat: 1.98 mg/dL — ABNORMAL HIGH (ref 0.50–1.35)
Glucose, Bld: 124 mg/dL — ABNORMAL HIGH (ref 70–99)
Potassium: 5.3 mEq/L (ref 3.5–5.3)

## 2013-09-28 LAB — POCT CBC
HCT, POC: 52.1 % (ref 43.5–53.7)
Lymph, poc: 2.4 (ref 0.6–3.4)
MCH, POC: 27.2 pg (ref 27–31.2)
MCHC: 31.3 g/dL — AB (ref 31.8–35.4)
MCV: 86.9 fL (ref 80–97)
MID (cbc): 0.9 (ref 0–0.9)
POC LYMPH PERCENT: 27.4 %L (ref 10–50)
Platelet Count, POC: 268 10*3/uL (ref 142–424)
RDW, POC: 22.4 %
WBC: 8.8 10*3/uL (ref 4.6–10.2)

## 2013-09-28 LAB — POCT URINALYSIS DIPSTICK
Blood, UA: NEGATIVE
Glucose, UA: NEGATIVE
Nitrite, UA: NEGATIVE
Spec Grav, UA: 1.015
pH, UA: 5.5

## 2013-09-28 LAB — POCT UA - MICROSCOPIC ONLY
Bacteria, U Microscopic: NEGATIVE
RBC, urine, microscopic: NEGATIVE

## 2013-09-28 LAB — POCT GLYCOSYLATED HEMOGLOBIN (HGB A1C): Hemoglobin A1C: 6.8

## 2013-09-28 NOTE — Telephone Encounter (Signed)
I will refer to GI.  Stop the Flagyl.  If symptoms increase, come in for reevaluation

## 2013-09-28 NOTE — Patient Instructions (Signed)
We will send you for a CT scan of your abdomen and send out STAT blood tests today.  I will give you a call as soon as we have these results in.    You should receive a phone call early next week from [facility] regarding your upcoming visit to look at your swallowing problem.

## 2013-09-28 NOTE — Progress Notes (Addendum)
[facility] [address] [place] [address] [phone]  Date:  09/28/2013   Name:  Frank Davidson   DOB:  Oct 16, 1951   MRN:  [id number]  PCP:  Georgann Housekeeper, MD    Chief Complaint: Follow-up, Diarrhea, Abdominal Pain, Nausea and Unable to eat   History of Present Illness:  Frank Davidson is a 62 y.o. very pleasant male patient who presents with the following:  Seen here on 10/29 with dysuria, urgency and frequency.  He was treated with septra for presumed cystitis.  Came back on 11/4 due to worsening abdominal cramps and loose stools. He was changed to flagyl 250 TID for 7 days.    He also had a recent hospitalization for CHF from 10/14 to 10/19.   He also has a history of HTN, COPD, DM, CKD stage III.  He also has chronic atrial fib on eliquis.  He is rate controlled.    His creat had gone up from 1.4 to 2.57 at his last visit here on 11/4.  His K was also elevated.  He was told to stop his K supplement.  He would like to recheck these labs today as well.    His GI symptoms have gotten worse since his last visit. He is having cramping and diarrhea, saliva "pooling in my throat."  Notes that foods are getting stuck in his throat.  He is able to swallow fluids and soft foods like jello- however he has not been able to swallow anything hard for about 5 days. No pain with swallowing.    He has nausea but no vomiting.  He also notes lower abdominal pain that seems like a "gnawing, pinching, biting."  He may have diarrhea twice an hour- he states this has been present around the clock for the last 10- 14 days.   No blood in his urine.    He has not noted any orthopnea or peripheral edema.  He is taking lasix 80 BID still.  He is from [place] and moved to this area during the summer.  He did have nephrologist in [place] but does not have one here yet.   History of CVA in August- unsure if this was due to his a fib.   He is seeing Dr. Katrinka Blazing with [facility] and Dr. Rogelia Rohrer with [facility] cardiology as well.    Wt Readings from Last 3 Encounters:  09/28/13 216 lb (97.977 kg)  09/25/13 216 lb (97.977 kg)  09/20/13 220 lb (99.791 kg)     Patient Active Problem List   Diagnosis Date Noted  . Acute on chronic systolic heart failure   . Acute CHF 09/04/2013  . Chest pain 09/04/2013  . Ocular herpes   . Neuropathy   . Left-sided weakness   . Acute on chronic diastolic CHF (congestive heart failure), NYHA class 3 07/09/2013  . Acute ischemic stroke 07/09/2013  . OSA (obstructive sleep apnea) 07/09/2013  . Gout attack 07/07/2013  . Chronic systolic heart failure 07/05/2013  . Stroke 07/03/2013  . CVA (cerebral infarction) 07/03/2013  . A-fib 07/03/2013  . Hypokalemia 07/03/2013  . Hypertension   . COPD (chronic obstructive pulmonary disease)   . Diabetes mellitus without complication   . CKD (chronic kidney disease), stage III   . CHF (congestive heart failure)   . Nonischemic cardiomyopathy   . Polysubstance abuse     Past Medical History  Diagnosis Date  . Fibromyalgia   . Atrial fibrillation   .  Hypertension   . Osteoarthritis   . COPD (chronic obstructive pulmonary disease)   . Sleep apnea with use of continuous positive airway pressure (CPAP)   . Diabetes mellitus without complication   . Coronary artery disease   . Neuropathy   . Ocular herpes   . Gout   . CKD (chronic kidney disease), stage III   . CHF (congestive heart failure)     EF 15%  . Polysubstance abuse     Hx of  . Chronic pain   . Hyperlipidemia   . Nonischemic cardiomyopathy   . Stroke   . Ocular herpes   . Neuropathy   . Left-sided weakness   . Acute on chronic systolic heart failure     Past Surgical History  Procedure Laterality Date  . 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 Cardiac catheterization  10/2006    normal coronary arteries    History  Substance Use Topics  . Smoking status: Former Smoker    Quit date: 12/18/2006  .  Smokeless tobacco: Not on file  . Alcohol Use: No    Family History  Problem Relation Age of Onset  . Cancer Mother   . Hypertension Mother   . Diabetes Father   . Diabetes Sister   . Hypertension Sister   . Hypertension Brother   . Hypertension Sister     Allergies  Allergen Reactions  . Actos [Pioglitazone] Other (See Comments)    Caused blood in urine and fluid retention     Medication list has been reviewed and updated.  Current Outpatient Prescriptions on File Prior to Visit  Medication Sig Dispense Refill  . allopurinol (ZYLOPRIM) 100 MG tablet Take 1 tablet (100 mg total) by mouth daily.  30 tablet  0  . apixaban (ELIQUIS) 5 MG TABS tablet Take 1 tablet (5 mg total) by mouth 2 (two) times daily.  60 tablet  0  . carvedilol (COREG) 25 MG tablet Take 1 tablet (25 mg total) by mouth 2 (two) times daily with a meal.  60 tablet  0  . digoxin (LANOXIN) 0.25 MG tablet Take 0.5 tablets (0.125 mg total) by mouth daily.  30 tablet  0  . furosemide (LASIX) 80 MG tablet Take 1 tablet (80 mg total) by mouth 2 (two) times daily.  60 tablet  0  . glipiZIDE (GLUCOTROL) 5 MG tablet Take 5 mg by mouth 2 (two) times daily before a meal.      . hydrALAZINE (APRESOLINE) 25 MG tablet Take 0.5 tablets (12.5 mg total) by mouth every 8 (eight) hours.  90 tablet  0  . isosorbide mononitrate (IMDUR) 30 MG 24 hr tablet Take 1 tablet (30 mg total) by mouth daily.  30 tablet  0  . lisinopril (PRINIVIL,ZESTRIL) 2.5 MG tablet Take 1 tablet (2.5 mg total) by mouth daily.  60 tablet  6  . metroNIDAZOLE (FLAGYL) 250 MG tablet Take 1 tablet (250 mg total) by mouth 3 (three) times daily.  21 tablet  0  . oxyCODONE-acetaminophen (PERCOCET/ROXICET) 5-325 MG per tablet Take 1 tablet by mouth every 8 (eight) hours as needed.  30 tablet  0  . pantoprazole (PROTONIX) 40 MG tablet Take 1 tablet (40 mg total) by mouth 2 (two) times daily.  60 tablet  0  . potassium chloride SA (K-DUR,KLOR-CON) 20 MEQ tablet Take 2  tablets (40 mEq total) by mouth 2 (two) times daily.  60 tablet  0  . Simethicone (GAS-X PO)  Take by mouth.      . senna-docusate (SENOKOT-S) 8.6-50 MG per tablet Take 2 tablets by mouth daily.      . simethicone (MYLICON) 80 MG chewable tablet Chew 1 tablet (80 mg total) by mouth 4 (four) times daily as needed for flatulence.  30 tablet  0   No current facility-administered medications on file prior to visit.    Review of Systems:  As per HPI- otherwise negative.   Physical Examination: Filed Vitals:   09/28/13 1440  BP: 128/68  Pulse: 63  Temp: 97.5 F (36.4 C)  Resp: 16   Filed Vitals:   09/28/13 1440  Height: 5\' 8"  (1.727 m)  Weight: 216 lb (97.977 kg)   Body mass index is 32.85 kg/(m^2). Ideal Body Weight: Weight in (lb) to have BMI = 25: 164.1  GEN: WDWN, NAD, Non-toxic, A & O x 3, obese, looks well HEENT: Atraumatic, Normocephalic. Neck supple. No masses, No LAD. Ears and Nose: No external deformity. CV: No M/G/R. No JVD. No thrill. No extra heart sounds.rate controlled a fib PULM: CTA B, no wheezes, crackles, rhonchi. No retractions. No resp. distress. No accessory muscle use. ABD: S, ND, +BS. No rebound. No HSM.  Vague lower abdominal tenderness.  Worst over bladder.  No specific RLQ tenderness  EXTR: No c/c/e.  No evidence of fluid retention at this time NEURO Normal gait.  PSYCH: Normally interactive. Conversant. Not depressed or anxious appearing.  Calm demeanor.   EXAM: ACUTE ABDOMEN SERIES (ABDOMEN 2 VIEW & CHEST 1 VIEW)  COMPARISON: Chest x-ray 09/04/2013.  FINDINGS: The upright chest x-ray is unremarkable and stable. No acute pulmonary findings. Streaky scarring changes noted.  Two views of the abdomen demonstrate a nonspecific bowel gas pattern. Scattered air in the colon and small bowel and a few scattered air-fluid levels but no distention. No free air. The soft tissue shadows are maintained. Severe degenerative changes involving the spine and  hips.  IMPRESSION: No acute cardiopulmonary findings.  Nonspecific bowel gas pattern. No definite findings for obstruction or perforation.  Results for orders placed in visit on 09/28/13  POCT CBC      Result Value Range   WBC 8.8  4.6 - 10.2 K/uL   Lymph, poc 2.4  0.6 - 3.4   POC LYMPH PERCENT 27.4  10 - 50 %L   MID (cbc) 0.9  0 - 0.9   POC MID % 10.0  0 - 12 %M   POC Granulocyte 5.5  2 - 6.9   Granulocyte percent 62.6  37 - 80 %G   RBC 6.00  4.69 - 6.13 M/uL   Hemoglobin 16.3  14.1 - 18.1 g/dL   HCT, POC 09.8  11.9 - 53.7 %   MCV 86.9  80 - 97 fL   MCH, POC 27.2  27 - 31.2 pg   MCHC 31.3 (*) 31.8 - 35.4 g/dL   RDW, POC 14.7     Platelet Count, POC 268  142 - 424 K/uL   MPV 9.0  0 - 99.8 fL  POCT GLYCOSYLATED HEMOGLOBIN (HGB A1C)      Result Value Range   Hemoglobin A1C 6.8    GLUCOSE, POCT (MANUAL RESULT ENTRY)      Result Value Range   POC Glucose 120 (*) 70 - 99 mg/dl  POCT URINALYSIS DIPSTICK      Result Value Range   Color, UA amber     Clarity, UA clear     Glucose, UA neg  Bilirubin, UA small     Ketones, UA trace     Spec Grav, UA 1.015     Blood, UA neg     pH, UA 5.5     Protein, UA trace     Urobilinogen, UA 0.2     Nitrite, UA neg     Leukocytes, UA Trace    POCT UA - MICROSCOPIC ONLY      Result Value Range   WBC, Ur, HPF, POC 1-3     RBC, urine, microscopic neg     Bacteria, U Microscopic neg     Mucus, UA trace     Epithelial cells, urine per micros 0-1     Crystals, Ur, HPF, POC calcium oxalate     Casts, Ur, LPF, POC waxy     Yeast, UA neg     [facility] reading (PRIMARY) by  Dr. Patsy Lager. Abdominal series: non- specific bowel gas pattern.   Assessment and Plan: Type II or unspecified type diabetes mellitus with unspecified complication, uncontrolled - Plan: POCT glycosylated hemoglobin (Hb A1C), POCT glucose (manual entry)  Chronic renal disease - Plan: Comprehensive metabolic panel, POCT CBC, POCT urinalysis dipstick, POCT UA -  Microscopic Only, CANCELED: CT Abdomen Pelvis W Contrast  Other dysphagia - Plan: CANCELED: CT Abdomen Pelvis W Contrast  Abdominal  pain, other specified site - Plan: DG Abd Acute W/Chest, DG Abd Acute W/Chest, CT Abdomen Pelvis W Contrast, CANCELED: CT Abdomen Pelvis W Contrast  Sent for CT scan of abd/ pelvis today due to persistent diarrhea.  Will be done with oral contrast only.  Scheduled for now at [facility].  Instructed pt and his wife where to park and where to go for the CT, told them to stay at the hospital until they hear from me in case admission is needed.  Will also check stat CMP .  Spoke with doc of the day at [facility] regarding his dysphagia.  They will see him early next week- advised pt to be expecting their call soon and to le me know if they do not hear.    Signed Abbe Amsterdam, MD  Received notice from radiology that he was a "no show" for his CT scan.  Called him home around 9:50 pm; spoke with his niece who stated that Jarron was not available and that he was already in bed.  She reported to me that he was under the impression that the CT was to be scheduled at a later time and did not understand they were to go now.  As long as he is resting comfortably can wait and do the CT tomorrow.  It is already quite late.    His renal function is improved, hyperkalemia resolved. Will call in the am and get his CT rescheduled.      Chemistry      Component Value Date/Time   NA 130* 09/28/2013 1609   K 5.3 09/28/2013 1609   CL 95* 09/28/2013 1609   CO2 24 09/28/2013 1609   BUN 24* 09/28/2013 1609   CREATININE 1.98* 09/28/2013 1609   CREATININE 1.41* 09/17/2013 1528      Component Value Date/Time   CALCIUM 10.2 09/28/2013 1609   ALKPHOS 84 09/28/2013 1609   AST 102* 09/28/2013 1609   ALT 92* 09/28/2013 1609   BILITOT 0.6 09/28/2013 1609     CT rescheduled for 11/8 Called with CT results: CT ABDOMEN AND PELVIS WITHOUT CONTRAST  TECHNIQUE: Multidetector CT imaging of the  abdomen and pelvis  was performed following the standard protocol without intravenous contrast.  COMPARISON: None.  FINDINGS: Mild linear scarring at the lung bases.  Unenhanced liver, spleen, pancreas, and adrenal glands are within normal limits.  Gallbladder is underdistended. No intrahepatic or extrahepatic ductal dilatation.  Possible punctate nonobstructing interpolar left renal calculus (series 2/ image 31). Right kidney is unremarkable. No hydronephrosis.  No evidence of bowel obstruction. Normal appendix. Mild sigmoid diverticulosis, without associated inflammatory changes.  Atherosclerotic calcifications of the abdominal aorta and branch vessels.  No abdominopelvic ascites.  No suspicious abdominopelvic lymphadenopathy.  Prostate is unremarkable.  No ureteral or bladder calculi.  Bladder is decompressed.  Extensive degenerative changes of the visualized thoracolumbar spine.  IMPRESSION: No evidence of bowel obstruction. Normal appendix.  Colonic diverticulosis, without associated inflammatory changes.  Possible punctate nonobstructing left renal calculus. No ureteral or bladder calculi. No hydronephrosis.  Discussed with pt in detail.  CT is overall reassuring.  His diarrhea continues.  His lytes and renal fxn are better.  ?flagyl may have caused elevated LFTs. Mild hyponatremia.  He will decrease his lasix to 120 a day from 160.   Will add on acute hep panel and c. Diff if possible to labs.   Recheck CMP in 48 hours as a lab visit only.

## 2013-09-28 NOTE — Telephone Encounter (Signed)
Called to advise. Spoke to the niece.

## 2013-09-28 NOTE — Telephone Encounter (Signed)
Attempted to do prior auth for the CT scan, but get notification this but the insurance has termed. He needs scan now. This will be self pay.

## 2013-09-29 ENCOUNTER — Ambulatory Visit (HOSPITAL_COMMUNITY)
Admission: RE | Admit: 2013-09-29 | Discharge: 2013-09-29 | Disposition: A | Discharge status: Home / Self Care | Payer: Medicare Other | Source: Ambulatory Visit | Attending: Family Medicine | Admitting: Family Medicine

## 2013-09-29 ENCOUNTER — Telehealth: Payer: Self-pay

## 2013-09-29 ENCOUNTER — Encounter (HOSPITAL_COMMUNITY): Payer: Self-pay

## 2013-09-29 DIAGNOSIS — R109 Unspecified abdominal pain: Secondary | ICD-10-CM | POA: Insufficient documentation

## 2013-09-29 DIAGNOSIS — R197 Diarrhea, unspecified: Secondary | ICD-10-CM | POA: Insufficient documentation

## 2013-09-29 DIAGNOSIS — R110 Nausea: Secondary | ICD-10-CM | POA: Insufficient documentation

## 2013-09-29 DIAGNOSIS — J984 Other disorders of lung: Secondary | ICD-10-CM | POA: Insufficient documentation

## 2013-09-29 DIAGNOSIS — K5730 Diverticulosis of large intestine without perforation or abscess without bleeding: Secondary | ICD-10-CM | POA: Insufficient documentation

## 2013-09-29 NOTE — Addendum Note (Signed)
Addended by: Abbe Amsterdam C on: 09/29/2013 03:29 PM   Modules accepted: Orders

## 2013-09-29 NOTE — Telephone Encounter (Signed)
Called and spoke with Tammy at [facility]. Acute Hep Panel added. Do you want me to call him and have him stop by to p/u a specimen container for the C Diff?

## 2013-09-29 NOTE — Addendum Note (Signed)
Addended by: Cydney Ok on: 09/29/2013 09:40 AM   Modules accepted: Orders

## 2013-09-29 NOTE — Telephone Encounter (Signed)
Message copied by Johnnette Litter on Sat Sep 29, 2013  3:59 PM ------      Message from: Pearline Cables      Created: Sat Sep 29, 2013  3:29 PM       Please add on an acute hep panel and c diff screen if possible for elevated liver function, diarrhea.  Thanks!\      JC ------

## 2013-09-30 LAB — HEPATITIS PANEL, ACUTE
HCV Ab: NEGATIVE
Hepatitis B Surface Ag: NEGATIVE

## 2013-10-01 ENCOUNTER — Encounter: Payer: Self-pay | Admitting: Internal Medicine

## 2013-10-01 ENCOUNTER — Telehealth: Payer: Self-pay

## 2013-10-01 ENCOUNTER — Ambulatory Visit (INDEPENDENT_AMBULATORY_CARE_PROVIDER_SITE_OTHER): Payer: Medicare Other | Admitting: Gastroenterology

## 2013-10-01 ENCOUNTER — Ambulatory Visit (HOSPITAL_COMMUNITY)
Admission: RE | Admit: 2013-10-01 | Discharge: 2013-10-01 | Disposition: A | Discharge status: Home / Self Care | Payer: Medicare Other | Source: Ambulatory Visit | Attending: Internal Medicine | Admitting: Internal Medicine

## 2013-10-01 ENCOUNTER — Encounter: Payer: Self-pay | Admitting: Gastroenterology

## 2013-10-01 VITALS — BP 124/60 | HR 61 | Wt 211.0 lb

## 2013-10-01 VITALS — BP 108/68 | HR 80 | Ht 68.0 in | Wt 216.0 lb

## 2013-10-01 DIAGNOSIS — I5022 Chronic systolic (congestive) heart failure: Secondary | ICD-10-CM | POA: Insufficient documentation

## 2013-10-01 DIAGNOSIS — I5023 Acute on chronic systolic (congestive) heart failure: Secondary | ICD-10-CM

## 2013-10-01 DIAGNOSIS — I4891 Unspecified atrial fibrillation: Secondary | ICD-10-CM | POA: Insufficient documentation

## 2013-10-01 DIAGNOSIS — R1310 Dysphagia, unspecified: Secondary | ICD-10-CM | POA: Insufficient documentation

## 2013-10-01 DIAGNOSIS — R197 Diarrhea, unspecified: Secondary | ICD-10-CM

## 2013-10-01 DIAGNOSIS — N183 Chronic kidney disease, stage 3 unspecified: Secondary | ICD-10-CM | POA: Insufficient documentation

## 2013-10-01 LAB — DIGOXIN LEVEL: Digoxin Level: 2.5 ng/mL — ABNORMAL HIGH (ref 0.8–2.0)

## 2013-10-01 MED ORDER — HYOSCYAMINE SULFATE ER 0.375 MG PO TBCR: EXTENDED_RELEASE_TABLET | ORAL | Status: DC

## 2013-10-01 MED ORDER — FUROSEMIDE 80 MG PO TABS: 80.0000 mg | ORAL_TABLET | Freq: Every day | ORAL | Status: DC

## 2013-10-01 NOTE — Progress Notes (Signed)
History of Present Illness: 62 year old Afro-American male with atrial fibrillation, COPD, sleep apnea, diabetes, coronary artery disease, congestive heart failure, on Eliquis, referred for evaluation of dysphagia and diarrhea.  The past month he's been complaining of dysphagia to solids.  This was gradual onset.  He denies odynophagia.  He has occasional pyrosis.  He's had diarrhea for several weeks.  He was treated with Flagyl without improvement.  He did receive a couple of courses of antibiotics including Rocephin and Bactrim.  He complains of diffuse crampy abdominal discomfort.  There is no history of melena or rectal bleeding.  He's had no antecedent GI complaints.  He has lost almost 30 pounds in the last 2 months    Past Medical History  Diagnosis Date  . Fibromyalgia   . Atrial fibrillation   . Hypertension   . Osteoarthritis   . COPD (chronic obstructive pulmonary disease)   . Sleep apnea with use of continuous positive airway pressure (CPAP)   . Diabetes mellitus without complication   . Coronary artery disease   . Neuropathy   . Ocular herpes   . Gout   . CKD (chronic kidney disease), stage III   . CHF (congestive heart failure)     EF 15%  . Polysubstance abuse     Hx of  . Chronic pain   . Hyperlipidemia   . Nonischemic cardiomyopathy   . Stroke   . Ocular herpes   . Neuropathy   . Left-sided weakness   . Acute on chronic systolic heart failure    Past Surgical History  Procedure Laterality Date  . 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 Cardiac catheterization  10/2006    normal coronary arteries   family history includes Cancer in his mother; Diabetes in his father and sister; Hypertension in his brother, mother, sister, and sister. Current Outpatient Prescriptions  Medication Sig Dispense Refill  . allopurinol (ZYLOPRIM) 100 MG tablet Take 1 tablet (100 mg total) by mouth daily.  30 tablet  0  . apixaban (ELIQUIS) 5 MG TABS tablet Take 1 tablet (5 mg total) by  mouth 2 (two) times daily.  60 tablet  0  . carvedilol (COREG) 25 MG tablet Take 1 tablet (25 mg total) by mouth 2 (two) times daily with a meal.  60 tablet  0  . furosemide (LASIX) 80 MG tablet Take 1 tablet (80 mg total) by mouth daily.  30 tablet  6  . glipiZIDE (GLUCOTROL) 5 MG tablet Take 5 mg by mouth 2 (two) times daily before a meal.      . hydrALAZINE (APRESOLINE) 25 MG tablet Take 0.5 tablets (12.5 mg total) by mouth every 8 (eight) hours.  90 tablet  0  . isosorbide mononitrate (IMDUR) 30 MG 24 hr tablet Take 1 tablet (30 mg total) by mouth daily.  30 tablet  0  . lisinopril (PRINIVIL,ZESTRIL) 2.5 MG tablet Take 1 tablet (2.5 mg total) by mouth daily.  60 tablet  6  . oxyCODONE-acetaminophen (PERCOCET/ROXICET) 5-325 MG per tablet Take 1 tablet by mouth every 8 (eight) hours as needed.  30 tablet  0  . pantoprazole (PROTONIX) 40 MG tablet Take 1 tablet (40 mg total) by mouth 2 (two) times daily.  60 tablet  0  . Simethicone (GAS-X PO) Take by mouth.       No current facility-administered medications for this visit.   Allergies as of 10/01/2013 - Review Complete 10/01/2013  Allergen Reaction Noted  .  Actos [pioglitazone] Other (See Comments) 07/03/2013    reports that he quit smoking about 6 years ago. He has never used smokeless tobacco. He reports that he does not drink alcohol or use illicit drugs.     Review of Systems: He has lower extremity weakness and walks with a walker Pertinent positive and negative review of systems were noted in the above HPI section. All other review of systems were otherwise negative.  Vital signs were reviewed in today's medical record Physical Exam: General: Well developed , well nourished, no acute distress Skin: anicteric Head: Normocephalic and atraumatic Eyes:  sclerae anicteric, EOMI Ears: Normal auditory acuity Mouth: No deformity or lesions Neck: Supple, no masses or thyromegaly Lungs: Clear throughout to auscultation Heart: Regular  rate and rhythm; no murmurs, rubs or bruits Abdomen: Soft, non tender and non distended. No masses, hepatosplenomegaly or hernias noted. Normal Bowel sounds Rectal:deferred Musculoskeletal: Symmetrical with no gross deformities  Skin: No lesions on visible extremities Pulses:  Normal pulses noted Extremities: No clubbing, cyanosis, edema or deformities noted Neurological: Alert oriented x 4, grossly nonfocal Cervical Nodes:  No significant cervical adenopathy Inguinal Nodes: No significant inguinal adenopathy Psychological:  Alert and cooperative. Normal mood and affect

## 2013-10-01 NOTE — Patient Instructions (Signed)
Stop digoxin  Hold lasix for two days  On Thursday take lasix 80 mg daily  Follow up in 2 weeks.   Do the following things EVERYDAY: 1) Weigh yourself in the morning before breakfast. Write it down and keep it in a log. 2) Take your medicines as prescribed 3) Eat low salt foods-Limit salt (sodium) to 2000 mg per day.  4) Stay as active as you can everyday 5) Limit all fluids for the day to less than 2 liters

## 2013-10-01 NOTE — Assessment & Plan Note (Signed)
Recent onset dysphagia following antibiotic therapy raises the question of Candida esophagitis.  Oral exam is free of obvious yeast. Dysphagia from a stricture or motility disorder are other considerations..  Recommendations #1 upper endoscopy.

## 2013-10-01 NOTE — Assessment & Plan Note (Addendum)
Diarrhea could be related to antibiotic use.  Pseudomembranous colitis should be ruled out.  Recommendations #1 stool for C. difficile toxin; if negative I will proceed with colonoscopy and schedule upper endoscopy at the same sitting.  The risk of holding anticoagulation therapy or antiplatelet medications was discussed including the increased risk for thromboembolic disease that may include DVT, pulmonary emboli and stroke. The patient understands this risk and is willing to proceed with temporally holding the medication provided that this is approved by her PCP or cardiologist.

## 2013-10-01 NOTE — Telephone Encounter (Signed)
Pt scheduled to see Dr. Arlyce Dice today at 3:15pm. Pt aware of appt.

## 2013-10-01 NOTE — Patient Instructions (Signed)
Go to the basement for labs You will need a colon/Endoscopy Pending the results of the Cdiff test We will contact you with the results

## 2013-10-01 NOTE — Addendum Note (Signed)
Addended by: Abbe Amsterdam C on: 10/01/2013 08:19 AM   Modules accepted: Orders

## 2013-10-01 NOTE — Telephone Encounter (Signed)
Message copied by Chrystie Nose on Mon Oct 01, 2013  8:44 AM ------      Message from: Melvia Heaps D      Created: Fri Sep 28, 2013  4:23 PM       This patient can only swallow liquids.  Please schedule an office visit for early next week ------

## 2013-10-01 NOTE — Progress Notes (Signed)
Patient ID: Frank Davidson, male   DOB: [birth year], 62 y.o.   MRN: [id number]  Weight Range  229 pounds   Baseline proBNP     HPI: Mr. Vuncannon is a 62 yo male with a hx of obesity, atrial fibrillation (paroxysmal), HTN, COPD, HTN, gout, DM 2, CVA and NICM with EF 20-25% (06/2013).   Has previously followed in the [facility] in [place]. Previous cath in 2007 showed NICM with normal cors by report. Was living alone in [place] and apparently was issues managing medications by himself and was getting confused with PCP and cardiologists medications and was not taking his meds correctly. So he was moved to [facility] this summer to be in [place] near his nieces. Has not been on ACE-I due to renal failure. Highest creatinine in our system is ~1.5 but was apparently higher previously - thought to be related to taking his medications incorrectly.   Was admitted in 06/2013 from [facility] with left sided weakness and was found to have a moderate to lg sized right hemispheric infarct. Went back to [facility].   Admitted to [facility] 10/14 through 09/09/13 with ADHF. Diuresed 30 pounds with IV lasix and transitioned to lasix 80 mg po bid. Continued on 25 mg carvedilol bid, isordil 20 mg daily.  Started on lisinopril 2.5 mg daily. Discharge weight 229 pound.    He returns for follow up. Last visit he was instructed to cut back digoxin to 0.125 mg daily however he continue don 0.25 mg daily. Overall feeling ok. Complains of fatigue. Denies SOB/PND/Orthopnea.  He limits his fluid to 1 liters per day. Follows low salt diet. Followed by [facility] RN/PT. Compliant with medications.    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  Past Medical History  Diagnosis Date  . Fibromyalgia   . Atrial fibrillation   . Hypertension   . Osteoarthritis   . COPD (chronic obstructive pulmonary disease)   . Sleep apnea with use of continuous positive airway pressure (CPAP)   . Diabetes mellitus without complication   . Coronary artery disease   . Neuropathy   . Ocular herpes   . Gout   . CKD (chronic kidney disease), stage III   . CHF (congestive heart failure)     EF 15%  . Polysubstance abuse     Hx of  . Chronic pain   . Hyperlipidemia   . Nonischemic cardiomyopathy   . Stroke   . Ocular herpes   . Neuropathy   . Left-sided weakness   . Acute on chronic systolic heart failure     Current Outpatient Prescriptions  Medication Sig Dispense Refill  . allopurinol (ZYLOPRIM) 100 MG tablet Take 1 tablet (100 mg total) by mouth daily.  30 tablet  0  . apixaban (ELIQUIS) 5 MG TABS tablet Take 1 tablet (5 mg total) by mouth 2 (two) times daily.  60 tablet  0  . carvedilol (COREG) 25 MG tablet Take 1 tablet (25 mg total) by mouth 2 (two) times daily with a meal.  60 tablet  0  . digoxin (LANOXIN) 0.25 MG tablet Take 0.25 mg by  mouth daily.      . furosemide (LASIX) 80 MG tablet Take by mouth. Take 80 mg in am and 40 mg in pm.      . glipiZIDE (GLUCOTROL) 5 MG tablet Take 5 mg by mouth 2 (two) times daily before a meal.      . hydrALAZINE (APRESOLINE) 25 MG tablet Take 0.5 tablets (12.5 mg total) by mouth every 8 (eight) hours.  90 tablet  0  . isosorbide mononitrate (IMDUR) 30 MG 24 hr tablet Take 1 tablet (30 mg total) by mouth daily.  30 tablet  0  . lisinopril (PRINIVIL,ZESTRIL) 2.5 MG tablet Take 1 tablet (2.5 mg total) by mouth daily.  60 tablet  6  . oxyCODONE-acetaminophen (PERCOCET/ROXICET) 5-325 MG per tablet Take 1 tablet by mouth every 8 (eight) hours as needed.  30 tablet  0  . pantoprazole (PROTONIX) 40 MG tablet Take 1 tablet (40 mg total) by mouth 2 (two) times daily.  60 tablet  0  .  Simethicone (GAS-X PO) Take by mouth.       No current facility-administered medications for this encounter.     PHYSICAL EXAM: Filed Vitals:   10/01/13 1110  BP: 124/60  Pulse: 38  Weight: 211 lb (95.709 kg)  SpO2: 100%    General:  Well appearing. No resp difficulty HEENT: normal Neck: supple. JVP flat Carotids 2+ bilaterally; no bruits. No lymphadenopathy or thryomegaly appreciated. Cor: PMI normal. Irregular  rate & rhythm. No rubs, gallops or murmurs. Lungs: clear Abdomen: obese soft, nontender, nondistended. No hepatosplenomegaly. No bruits or masses. Good bowel sounds. Extremities: no cyanosis, clubbing, rash, edema Neuro: alert & orientedx3, cranial nerves grossly intact. Affect pleasant.  EKG: A fib 61 bpm. Normal T waves and QRS 98 ms    ASSESSMENT & PLAN: 1)Chronic Systolic EF 20-25% NICM . NYHA III -Volume status low. Hold lasix for 2 days then restart 80 mg daily . Keep off potassium  -Continue carvedilol 25 mg twice a day. Stop digoxin  -Continue lisinopril 2.5 mg in am and add 2.5 mg in pm but may need to stop if K elevated.  -Continue hydralazine 12.5 mg tid and Imdur 30 mg daily.  - Provided with a scale and weight chart. Reinforced daily weights, low salt food choices, and limiting fluid intake to < 2 liters per day.  Check BMET today and Friday.     2) HTN - Stable. Continue current regimen.   3) h/o CVA   4) Gout - Continue allopurinol. Refer to rheumatology in [place].   5) DM2 - Per PCP  6) Atrial fibrillation, on eliquis 5 mg bid.   7) Acute on chronic renal failure, stage III Check BMET today and Friday.   Follow up in 2 weeks.   CLEGG,AMYNP-C 11:23 AM  Patient seen and examined with Tonye Becket, NP. We discussed all aspects of the encounter. I agree with the assessment and plan as stated above.   He is dry. Renal function is worse. Dig level is high and he has not been able to adjust it per our instructions. Will hold lasix x 2 days and cut  back to 80 mg daily. Reinforced need for daily weights and reviewed use of sliding scale diuretics. Stop digoxin due to risk for ongoing toxicity. (no toxicity on ECG today)  Recheck labs on Friday. Hold potassium. Continue Eliquis for AF.   Truman Hayward 3:39 PM

## 2013-10-02 ENCOUNTER — Other Ambulatory Visit: Payer: Medicare Other

## 2013-10-02 DIAGNOSIS — R197 Diarrhea, unspecified: Secondary | ICD-10-CM

## 2013-10-02 NOTE — Addendum Note (Signed)
Encounter addended by: Deitra Mayo, CCT on: 10/02/2013  7:52 AM<BR>     Documentation filed: Charges VN

## 2013-10-03 LAB — CLOSTRIDIUM DIFFICILE BY PCR: Toxigenic C. Difficile by PCR: NOT DETECTED

## 2013-10-05 ENCOUNTER — Other Ambulatory Visit (INDEPENDENT_AMBULATORY_CARE_PROVIDER_SITE_OTHER): Payer: Medicare Other

## 2013-10-05 DIAGNOSIS — E1165 Type 2 diabetes mellitus with hyperglycemia: Secondary | ICD-10-CM

## 2013-10-05 DIAGNOSIS — N189 Chronic kidney disease, unspecified: Secondary | ICD-10-CM

## 2013-10-05 DIAGNOSIS — R197 Diarrhea, unspecified: Secondary | ICD-10-CM

## 2013-10-05 LAB — COMPREHENSIVE METABOLIC PANEL
ALT: 55 U/L — ABNORMAL HIGH (ref 0–53)
AST: 41 U/L — ABNORMAL HIGH (ref 0–37)
Albumin: 4 g/dL (ref 3.5–5.2)
CO2: 25 mEq/L (ref 19–32)
Calcium: 10.4 mg/dL (ref 8.4–10.5)
Chloride: 96 mEq/L (ref 96–112)
Glucose, Bld: 114 mg/dL — ABNORMAL HIGH (ref 70–99)
Potassium: 4.8 mEq/L (ref 3.5–5.3)
Sodium: 132 mEq/L — ABNORMAL LOW (ref 135–145)
Total Bilirubin: 0.7 mg/dL (ref 0.3–1.2)
Total Protein: 7.2 g/dL (ref 6.0–8.3)

## 2013-10-05 NOTE — Progress Notes (Signed)
Patient here for labs only. 

## 2013-10-06 ENCOUNTER — Encounter: Payer: Self-pay | Admitting: Family Medicine

## 2013-10-06 ENCOUNTER — Other Ambulatory Visit: Payer: Self-pay | Admitting: Family Medicine

## 2013-10-06 DIAGNOSIS — R899 Unspecified abnormal finding in specimens from other organs, systems and tissues: Secondary | ICD-10-CM

## 2013-10-06 NOTE — Progress Notes (Signed)
Received follow-up CMP. Called and discussed with him.  I still need to touch base with one of his cardiologists; will call on Monday.  Improvement in liver and kidney function!  Will place lab order for repeat CMP in about 2 weeks.  He could also likely ask Dr. Marzetta Board office to draw this if he happens to be there during this time period.    Results for orders placed in visit on 10/05/13  COMPREHENSIVE METABOLIC PANEL      Result Value Range   Sodium 132 (*) 135 - 145 mEq/L   Potassium 4.8  3.5 - 5.3 mEq/L   Chloride 96  96 - 112 mEq/L   CO2 25  19 - 32 mEq/L   Glucose, Bld 114 (*) 70 - 99 mg/dL   BUN 15  6 - 23 mg/dL   Creat 9.60 (*) 4.54 - 1.35 mg/dL   Total Bilirubin 0.7  0.3 - 1.2 mg/dL   Alkaline Phosphatase 78  39 - 117 U/L   AST 41 (*) 0 - 37 U/L   ALT 55 (*) 0 - 53 U/L   Total Protein 7.2  6.0 - 8.3 g/dL   Albumin 4.0  3.5 - 5.2 g/dL   Calcium 09.8  8.4 - 11.9 mg/dL

## 2013-10-08 ENCOUNTER — Telehealth: Payer: Self-pay | Admitting: Family Medicine

## 2013-10-08 NOTE — Telephone Encounter (Signed)
Called and discussed with Dr. Arlyce Dice- Dr. Katrinka Blazing stated that stopping the eliquis needs to be approached with caution.  Therefore, Dr. Arlyce Dice plans to start with a barium swallow to see if UGI is necessary.  Passed this information along to [facility] niece.

## 2013-10-11 ENCOUNTER — Other Ambulatory Visit: Payer: Self-pay

## 2013-10-11 ENCOUNTER — Telehealth: Payer: Self-pay | Admitting: Gastroenterology

## 2013-10-11 DIAGNOSIS — R1310 Dysphagia, unspecified: Secondary | ICD-10-CM

## 2013-10-11 NOTE — Telephone Encounter (Signed)
Pt scheduled for barium swallow at [facility] Monday 10/15/13. Pt to arrive there at 9:15am for a 9:30am appt. No prep for procedure. Left message for pt to call back.

## 2013-10-12 NOTE — Telephone Encounter (Signed)
Spoke to pt and he is aware.

## 2013-10-15 ENCOUNTER — Telehealth: Payer: Self-pay

## 2013-10-15 ENCOUNTER — Ambulatory Visit (HOSPITAL_COMMUNITY)
Admission: RE | Admit: 2013-10-15 | Discharge: 2013-10-15 | Disposition: A | Discharge status: Home / Self Care | Payer: Medicare Other | Source: Ambulatory Visit | Attending: Gastroenterology | Admitting: Gastroenterology

## 2013-10-15 ENCOUNTER — Other Ambulatory Visit: Payer: Self-pay | Admitting: *Deleted

## 2013-10-15 DIAGNOSIS — K219 Gastro-esophageal reflux disease without esophagitis: Secondary | ICD-10-CM

## 2013-10-15 DIAGNOSIS — E119 Type 2 diabetes mellitus without complications: Secondary | ICD-10-CM

## 2013-10-15 DIAGNOSIS — M109 Gout, unspecified: Secondary | ICD-10-CM

## 2013-10-15 DIAGNOSIS — R1310 Dysphagia, unspecified: Secondary | ICD-10-CM | POA: Insufficient documentation

## 2013-10-15 DIAGNOSIS — R109 Unspecified abdominal pain: Secondary | ICD-10-CM | POA: Insufficient documentation

## 2013-10-15 MED ORDER — CARVEDILOL 25 MG PO TABS: 25.0000 mg | ORAL_TABLET | Freq: Two times a day (BID) | ORAL | Status: DC

## 2013-10-15 MED ORDER — APIXABAN 5 MG PO TABS: 5.0000 mg | ORAL_TABLET | Freq: Two times a day (BID) | ORAL | Status: DC

## 2013-10-15 NOTE — Telephone Encounter (Signed)
Wants med refills allopurinol (ZYLOPRIM) 100 MG tablet pantoprazole (PROTONIX) 40 MG tablet  oxyCODONE-acetaminophen (PERCOCET/ROXICET) 5-325 MG per tablet   [phone]

## 2013-10-15 NOTE — Telephone Encounter (Signed)
We have not been providing these for him,  Please advise.

## 2013-10-16 ENCOUNTER — Telehealth (HOSPITAL_COMMUNITY): Payer: Self-pay | Admitting: *Deleted

## 2013-10-16 MED ORDER — GLIPIZIDE 5 MG PO TABS: 5.0000 mg | ORAL_TABLET | Freq: Two times a day (BID) | ORAL | Status: DC

## 2013-10-16 MED ORDER — ALLOPURINOL 100 MG PO TABS: 100.0000 mg | ORAL_TABLET | Freq: Every day | ORAL | Status: DC

## 2013-10-16 MED ORDER — PANTOPRAZOLE SODIUM 40 MG PO TBEC: 40.0000 mg | DELAYED_RELEASE_TABLET | Freq: Two times a day (BID) | ORAL | Status: DC

## 2013-10-16 NOTE — Telephone Encounter (Signed)
Called him back.  He does not have a current PCP.  He will start with the [facility] outpt clinic in a few months,but they cannot see him yet.  He needs allopurinol, glucotrol, protonix.  He does used percocet sometimes for pain but states that he does not have to have this and understands that I need to evaluate him prior to rx this medication.

## 2013-10-17 ENCOUNTER — Encounter: Payer: Self-pay | Admitting: Licensed Clinical Social Worker

## 2013-10-17 ENCOUNTER — Ambulatory Visit (HOSPITAL_COMMUNITY)
Admission: RE | Admit: 2013-10-17 | Discharge: 2013-10-17 | Disposition: A | Discharge status: Home / Self Care | Payer: Medicare Other | Source: Ambulatory Visit | Attending: Internal Medicine | Admitting: Internal Medicine

## 2013-10-17 ENCOUNTER — Encounter: Payer: Self-pay | Admitting: Internal Medicine

## 2013-10-17 ENCOUNTER — Encounter (HOSPITAL_COMMUNITY): Payer: Self-pay

## 2013-10-17 VITALS — BP 104/60 | HR 53 | Wt 221.4 lb

## 2013-10-17 DIAGNOSIS — I4891 Unspecified atrial fibrillation: Secondary | ICD-10-CM | POA: Insufficient documentation

## 2013-10-17 DIAGNOSIS — I5022 Chronic systolic (congestive) heart failure: Secondary | ICD-10-CM

## 2013-10-17 DIAGNOSIS — I509 Heart failure, unspecified: Secondary | ICD-10-CM | POA: Insufficient documentation

## 2013-10-17 DIAGNOSIS — G4733 Obstructive sleep apnea (adult) (pediatric): Secondary | ICD-10-CM

## 2013-10-17 DIAGNOSIS — G4730 Sleep apnea, unspecified: Secondary | ICD-10-CM | POA: Diagnosis not present

## 2013-10-17 DIAGNOSIS — I2510 Atherosclerotic heart disease of native coronary artery without angina pectoris: Secondary | ICD-10-CM | POA: Insufficient documentation

## 2013-10-17 DIAGNOSIS — I428 Other cardiomyopathies: Secondary | ICD-10-CM | POA: Diagnosis not present

## 2013-10-17 DIAGNOSIS — E785 Hyperlipidemia, unspecified: Secondary | ICD-10-CM | POA: Insufficient documentation

## 2013-10-17 DIAGNOSIS — I129 Hypertensive chronic kidney disease with stage 1 through stage 4 chronic kidney disease, or unspecified chronic kidney disease: Secondary | ICD-10-CM | POA: Insufficient documentation

## 2013-10-17 DIAGNOSIS — E119 Type 2 diabetes mellitus without complications: Secondary | ICD-10-CM | POA: Insufficient documentation

## 2013-10-17 DIAGNOSIS — Z8673 Personal history of transient ischemic attack (TIA), and cerebral infarction without residual deficits: Secondary | ICD-10-CM | POA: Insufficient documentation

## 2013-10-17 DIAGNOSIS — N183 Chronic kidney disease, stage 3 unspecified: Secondary | ICD-10-CM | POA: Insufficient documentation

## 2013-10-17 DIAGNOSIS — M109 Gout, unspecified: Secondary | ICD-10-CM | POA: Insufficient documentation

## 2013-10-17 DIAGNOSIS — J449 Chronic obstructive pulmonary disease, unspecified: Secondary | ICD-10-CM | POA: Insufficient documentation

## 2013-10-17 DIAGNOSIS — J4489 Other specified chronic obstructive pulmonary disease: Secondary | ICD-10-CM | POA: Insufficient documentation

## 2013-10-17 NOTE — Patient Instructions (Addendum)
Follow up 3 weeks   Do the following things EVERYDAY: 1) Weigh yourself in the morning before breakfast. Write it down and keep it in a log. 2) Take your medicines as prescribed 3) Eat low salt foods-Limit salt (sodium) to 2000 mg per day.  4) Stay as active as you can everyday 5) Limit all fluids for the day to less than 2 liters

## 2013-10-17 NOTE — Progress Notes (Signed)
CSW referred to assist patient with obtaining a PCP. CSW met with patient in the clinic. Patient reports that he previously lived in [place] until 5 months ago when he was hospitalized. He reports that his family moved him to [place] where they could assist and support him better with his healthcare needs. He reports that he has had multiple hospitalizations and rehab stay at [facility] over the past 5 months. He is currently residing with his niece and reports he has good support. He states that he has been seen by Dr. Arlyce Dice (GI) at [facility] although they have no opening for PCP visits. CSW contacted [facility] and scheduled appointment for PCP visit on November 01, 2013 at 8:45am. Patient informed of appointment and verbalized understanding. Patient appeared grateful and will call CSW if any other concerns arise. Lasandra Beech, LCSW [phone]

## 2013-10-17 NOTE — Progress Notes (Signed)
Patient ID: Frank Davidson, male   DOB: 1951/01/14, 62 y.o.   MRN: [id number]  Weight Range  229 pounds   Baseline proBNP    PCP: None GI: Dr Yves Dill Rheumatologist:   HPI: Frank Davidson is a 62 yo male with a hx of obesity, atrial fibrillation (paroxysmal), HTN, COPD, HTN, gout, DM 2, CVA and NICM with EF 20-25% (06/2013).   Has previously followed in the [facility] in [place]. Previous cath in 2007 showed NICM with normal cors by report. Was living alone in [place] and apparently was issues managing medications by himself and was getting confused with PCP and cardiologists medications and was not taking his meds correctly. So he was moved to [facility] this summer to be in [place] near his nieces. Has not been on ACE-I due to renal failure. Highest creatinine in our system is ~1.5 but was apparently higher previously - thought to be related to taking his medications incorrectly.   Was admitted in 06/2013 from [facility] with left sided weakness and was found to have a moderate to lg sized right hemispheric infarct. Went back to [facility].   Admitted to [facility] 10/14 through 09/09/13 with ADHF. Diuresed 30 pounds with IV lasix and transitioned to lasix 80 mg po bid. Continued on 25 mg carvedilol bid, isordil 20 mg daily.  Started on lisinopril 2.5 mg daily. Discharge weight 229 pound.    He returns for follow up. Last visit lasix was held for 2 days then he restarted lasix 80 mg daily. Also digoxin stopped due to elevated dig level 1.7. Denies SOB/PND/Orthopnea/dizziness.   Sleeps on 1 pillow.  Weight at home 210-217 pounds. Ongoing abdominal discomfort with plans for EGD in January. Followed closely by Dr Yves Dill. He limits his fluid to 1 liters per day. Follows low salt diet. AHC to finish next week.  Ambulates with a walker.  Compliant with medications.    Labs 09/07/13 K 3.9 Creatinine 1.18  Labs 09/17/13 dig level 1.7 K 4.4 Creatinine 1.41 Pro BNP 2256 Labs 09/25/13 K 6.1 Creatinine  2.57  Labs 09/28/13  K 5.3 Creatinine 1.98  Labs 10/05/13 K 4.8 Creatinine 1.79  09/29/13 CT abdomen and pelvis - no obstruction . Diverticulosis. No inflammatory changes.    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  Past Medical History  Diagnosis Date  . Fibromyalgia   . Atrial fibrillation   . Hypertension   . Osteoarthritis   . COPD (chronic obstructive pulmonary disease)   . Sleep apnea with use of continuous positive airway pressure (CPAP)   . Diabetes mellitus without complication   . Coronary artery disease   . Neuropathy   . Ocular herpes   . Gout   . CKD (chronic kidney disease), stage III   . CHF (congestive heart failure)     EF 15%  . Polysubstance abuse     Hx of  . Chronic pain   . Hyperlipidemia   . Nonischemic cardiomyopathy   . Stroke   . Ocular herpes   . Neuropathy   . Left-sided weakness   . Acute on chronic systolic heart failure     Current Outpatient Prescriptions  Medication Sig Dispense Refill  . apixaban (ELIQUIS) 5 MG TABS tablet Take 1 tablet (5 mg total) by mouth 2 (two) times daily.  60 tablet  0  .  carvedilol (COREG) 25 MG tablet Take 1 tablet (25 mg total) by mouth 2 (two) times daily with a meal.  60 tablet  0  . furosemide (LASIX) 80 MG tablet Take 1 tablet (80 mg total) by mouth daily.  30 tablet  6  . hydrALAZINE (APRESOLINE) 25 MG tablet Take 0.5 tablets (12.5 mg total) by mouth every 8 (eight) hours.  90 tablet  0  . isosorbide mononitrate (IMDUR) 30 MG 24 hr tablet Take 1 tablet (30 mg total) by mouth daily.  30 tablet  0  . lisinopril (PRINIVIL,ZESTRIL) 2.5 MG tablet Take 1 tablet (2.5 mg total) by mouth daily.  60 tablet  6  . Simethicone (GAS-X PO) Take by mouth.       No current facility-administered medications for this encounter.     PHYSICAL EXAM: Filed Vitals:   10/17/13 0846  BP: 104/60   Pulse: 53  Weight: 221 lb 6.4 oz (100.426 kg)  SpO2: 100%    General:  Well appearing. No resp difficulty Ambulated into clinic with a walker  HEENT: normal Neck: supple. JVP 5-6.  Carotids 2+ bilaterally; no bruits. No lymphadenopathy or thryomegaly appreciated. Cor: PMI normal. Irregular  rate & rhythm. No rubs, gallops or murmurs. Lungs: clear Abdomen: obese soft, nontender, nondistended. No hepatosplenomegaly. No bruits or masses. Good bowel sounds. Extremities: no cyanosis, clubbing, rash, edema Neuro: alert & orientedx3, cranial nerves grossly intact. Affect pleasant.      ASSESSMENT & PLAN: 1)Chronic Systolic 06/2013 EF 20-25% NICM . NYHA II-III -Volume status stable. Continue  80 mg daily.   -Continue carvedilol 25 mg twice a day. He is not on dig due to elevated dig level.  -Reviewed BMET from 11/14 will not increase lisinopril due to elevated K and creatinine.  -Continue lisinopril 2.5 mg twice a day. -Continue hydralazine 12.5 mg tid and Imdur 30 mg daily.  -Reinforced daily weights, low salt food choices, and limiting fluid intake to < 2 liters per day.  Repeat ECHO if EF < 35% will need to discuss ICD. Currently he is not interested.   2) HTN - Stable. Continue current regimen.  3) h/o CVA  4) Gout - Continue allopurinol. Refer to rheumatology in [place] 5) DM2 - needs to establish with PCP. He does not have glucometer. HF SW referred to PCP [facility] 6) Atrial fibrillation, on eliquis 5 mg bid.  7) Acute on chronic renal failure, stage III    Follow up in 3 weeks with an ECHO  CLEGG,AMY NP-C 8:52 AM

## 2013-10-23 ENCOUNTER — Telehealth: Payer: Self-pay

## 2013-10-23 NOTE — Telephone Encounter (Signed)
Protonix, and what? Left message for him to call me back.

## 2013-10-23 NOTE — Telephone Encounter (Signed)
PT STATES DR COPLAND WAS GOING TO CALL HIM IN SOME MEDICINE BUT FORGOT THE PROTONIX AND PERENALAR. PLEASE CALL PT AT [phone]    [facility] [address]

## 2013-10-26 MED ORDER — PANTOPRAZOLE SODIUM 40 MG PO TBEC: 40.0000 mg | DELAYED_RELEASE_TABLET | Freq: Every day | ORAL | Status: DC

## 2013-10-26 MED ORDER — ALLOPURINOL 100 MG PO TABS: 100.0000 mg | ORAL_TABLET | Freq: Every day | ORAL | Status: DC

## 2013-10-26 NOTE — Telephone Encounter (Signed)
Refilled these 2 medications.  Looking back in his history it appears that he was on 100 mg of allopurinol in the past.

## 2013-10-26 NOTE — Telephone Encounter (Signed)
Patient is suppose to get a prescription for allopurinol.

## 2013-11-01 ENCOUNTER — Telehealth: Payer: Self-pay | Admitting: *Deleted

## 2013-11-01 ENCOUNTER — Ambulatory Visit (INDEPENDENT_AMBULATORY_CARE_PROVIDER_SITE_OTHER): Payer: Medicare Other | Admitting: Family

## 2013-11-01 ENCOUNTER — Encounter: Payer: Self-pay | Admitting: Family

## 2013-11-01 ENCOUNTER — Other Ambulatory Visit: Payer: Self-pay | Admitting: Family

## 2013-11-01 VITALS — BP 100/60 | HR 87 | Wt 230.0 lb

## 2013-11-01 DIAGNOSIS — I4891 Unspecified atrial fibrillation: Secondary | ICD-10-CM

## 2013-11-01 DIAGNOSIS — G609 Hereditary and idiopathic neuropathy, unspecified: Secondary | ICD-10-CM

## 2013-11-01 DIAGNOSIS — N183 Chronic kidney disease, stage 3 unspecified: Secondary | ICD-10-CM

## 2013-11-01 DIAGNOSIS — M1990 Unspecified osteoarthritis, unspecified site: Secondary | ICD-10-CM

## 2013-11-01 DIAGNOSIS — M792 Neuralgia and neuritis, unspecified: Secondary | ICD-10-CM

## 2013-11-01 DIAGNOSIS — M109 Gout, unspecified: Secondary | ICD-10-CM

## 2013-11-01 DIAGNOSIS — E1159 Type 2 diabetes mellitus with other circulatory complications: Secondary | ICD-10-CM

## 2013-11-01 DIAGNOSIS — E876 Hypokalemia: Secondary | ICD-10-CM

## 2013-11-01 DIAGNOSIS — I509 Heart failure, unspecified: Secondary | ICD-10-CM

## 2013-11-01 DIAGNOSIS — R609 Edema, unspecified: Secondary | ICD-10-CM

## 2013-11-01 LAB — COMPREHENSIVE METABOLIC PANEL
Alkaline Phosphatase: 74 U/L (ref 39–117)
BUN: 15 mg/dL (ref 6–23)
CO2: 26 mEq/L (ref 19–32)
Calcium: 9.9 mg/dL (ref 8.4–10.5)
GFR: 70.95 mL/min (ref >60.00)
Glucose, Bld: 98 mg/dL (ref 70–99)
Sodium: 140 mEq/L (ref 135–145)
Total Bilirubin: 0.6 mg/dL (ref 0.3–1.2)
Total Protein: 8.1 g/dL (ref 6.0–8.3)

## 2013-11-01 LAB — HEPATIC FUNCTION PANEL
Bilirubin, Direct: 0.1 mg/dL (ref 0.0–0.3)
Total Bilirubin: 0.6 mg/dL (ref 0.3–1.2)

## 2013-11-01 LAB — CBC WITH DIFFERENTIAL/PLATELET
Basophils Absolute: 0 10*3/uL (ref 0.0–0.1)
Basophils Relative: 0.5 % (ref 0.0–3.0)
Eosinophils Absolute: 0.1 10*3/uL (ref 0.0–0.7)
HCT: 41 % (ref 39.0–52.0)
Hemoglobin: 13.3 g/dL (ref 13.0–17.0)
MCHC: 32.3 g/dL (ref 30.0–36.0)
MCV: 85.6 fl (ref 78.0–100.0)
Monocytes Absolute: 0.9 10*3/uL (ref 0.1–1.0)
Neutro Abs: 5.7 10*3/uL (ref 1.4–7.7)
Platelets: 308 10*3/uL (ref 150.0–400.0)
RBC: 4.79 Mil/uL (ref 4.22–5.81)
RDW: 19.9 % — ABNORMAL HIGH (ref 11.5–14.6)

## 2013-11-01 MED ORDER — APIXABAN 5 MG PO TABS: 5.0000 mg | ORAL_TABLET | Freq: Two times a day (BID) | ORAL | Status: DC

## 2013-11-01 MED ORDER — ALLOPURINOL 100 MG PO TABS: 100.0000 mg | ORAL_TABLET | Freq: Every day | ORAL | Status: DC

## 2013-11-01 MED ORDER — POTASSIUM CHLORIDE CRYS ER 20 MEQ PO TBCR
20.0000 meq | EXTENDED_RELEASE_TABLET | Freq: Every day | ORAL | Status: DC
Start: 2013-11-01 — End: 2013-12-03

## 2013-11-01 MED ORDER — GLIPIZIDE 5 MG PO TABS: 5.0000 mg | ORAL_TABLET | Freq: Two times a day (BID) | ORAL | Status: DC

## 2013-11-01 MED ORDER — ISOSORBIDE MONONITRATE ER 30 MG PO TB24: 30.0000 mg | ORAL_TABLET | Freq: Every day | ORAL | Status: DC

## 2013-11-01 MED ORDER — LISINOPRIL 2.5 MG PO TABS: 2.5000 mg | ORAL_TABLET | Freq: Every day | ORAL | Status: DC

## 2013-11-01 MED ORDER — CARVEDILOL 25 MG PO TABS: 25.0000 mg | ORAL_TABLET | Freq: Two times a day (BID) | ORAL | Status: DC

## 2013-11-01 MED ORDER — FUROSEMIDE 80 MG PO TABS: 80.0000 mg | ORAL_TABLET | Freq: Every day | ORAL | Status: DC

## 2013-11-01 MED ORDER — HYDRALAZINE HCL 25 MG PO TABS: 12.5000 mg | ORAL_TABLET | Freq: Three times a day (TID) | ORAL | Status: DC

## 2013-11-01 MED ORDER — PANTOPRAZOLE SODIUM 40 MG PO TBEC: 40.0000 mg | DELAYED_RELEASE_TABLET | Freq: Every day | ORAL | Status: DC

## 2013-11-01 NOTE — Progress Notes (Signed)
Pre visit review using our clinic review tool, if applicable. No additional management support is needed unless otherwise documented below in the visit note. 

## 2013-11-01 NOTE — Patient Instructions (Signed)

## 2013-11-01 NOTE — Telephone Encounter (Signed)
Dr Arlyce Dice;  Pt is scheduled for colonoscopy at [facility] on 12/07/13 for EGD/Colonoscopy.  Pt had OV with you on 11/10.  Pt has EF of 20 to 25%: (ASA IV).  Per anesthesia protocol pt will need to be scheduled at hospital.  Please advise.  Thanks, [employer]

## 2013-11-01 NOTE — Progress Notes (Signed)
Subjective:    Patient ID: Frank Davidson, male    DOB: 1951/04/17, 62 y.o.   MRN: [id number]  HPI 62 year old African American male, nonsmoker who presents today with complaints of type 2 diabetes, atrial fibrillation, CVA, congestive heart failure, atrial fibrillation, COPD, chronic kidney disease, gout, hypokalemia, and residual left-sided weakness related to CVA. He is relocating here from shot in [place]. Patient reports that over the last 6 months his health has been declining but appears to be doing better now. He reports being hospitalized for several weeks being discharged to a nursing home, after being discharged to the nursing home, he sustained a CVA. He was then hospitalized again and subsequently had heart failure and renal failure. He currently has 20% function of his heart and kidneys. He is under the care of cardiology, nephrology, in gastroenterology. He scheduled for a colonoscopy 11/30/2012. Ambulates with a walker.   Review of Systems  Constitutional: Negative.   HENT: Negative.   Respiratory: Negative.   Cardiovascular: Positive for leg swelling. Negative for chest pain and palpitations.       Mild  Endocrine: Negative.   Genitourinary: Negative.   Musculoskeletal: Negative.   Skin: Negative.   Allergic/Immunologic: Negative.   Neurological: Negative.   Hematological: Negative.   Psychiatric/Behavioral: Negative.    Past Medical History  Diagnosis Date  . Fibromyalgia   . Atrial fibrillation   . Hypertension   . Osteoarthritis   . COPD (chronic obstructive pulmonary disease)   . Sleep apnea with use of continuous positive airway pressure (CPAP)   . Diabetes mellitus without complication   . Coronary artery disease   . Neuropathy   . Ocular herpes   . Gout   . CKD (chronic kidney disease), stage III   . CHF (congestive heart failure)     EF 15%  . Polysubstance abuse     Hx of  . Chronic pain   . Hyperlipidemia   . Nonischemic cardiomyopathy    . Stroke   . Ocular herpes   . Neuropathy   . Left-sided weakness   . Acute on chronic systolic heart failure     History   Social History  . Marital Status: Divorced    Spouse Name: N/A    Number of Children: N/A  . Years of Education: N/A   Occupational History  . Not on file.   Social History Main Topics  . Smoking status: Former Smoker    Quit date: 12/18/2006  . Smokeless tobacco: Never Used  . Alcohol Use: No  . Drug Use: No  . Sexual Activity: Not on file   Other Topics Concern  . Not on file   Social History Narrative  . No narrative on file    Past Surgical History  Procedure Laterality Date  . 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 Cardiac catheterization  10/2006    normal coronary arteries    Family History  Problem Relation Age of Onset  . Cancer Mother   . Hypertension Mother   . Diabetes Father   . Diabetes Sister   . Hypertension Sister   . Hypertension Brother   . Hypertension Sister     Allergies  Allergen Reactions  . Actos [Pioglitazone] Other (See Comments)    Caused blood in urine and fluid retention     Current Outpatient Prescriptions on File Prior to Visit  Medication Sig Dispense Refill  . apixaban (ELIQUIS) 5 MG TABS tablet Take  1 tablet (5 mg total) by mouth 2 (two) times daily.  60 tablet  0  . calcium carbonate (TUMS - DOSED IN MG ELEMENTAL CALCIUM) 500 MG chewable tablet Chew 1 tablet by mouth 3 (three) times daily.      . carvedilol (COREG) 25 MG tablet Take 1 tablet (25 mg total) by mouth 2 (two) times daily with a meal.  60 tablet  0  . furosemide (LASIX) 80 MG tablet Take 1 tablet (80 mg total) by mouth daily.  30 tablet  6  . hydrALAZINE (APRESOLINE) 25 MG tablet Take 0.5 tablets (12.5 mg total) by mouth every 8 (eight) hours.  90 tablet  0  . isosorbide mononitrate (IMDUR) 30 MG 24 hr tablet Take 1 tablet (30 mg total) by mouth daily.  30 tablet  0  . lisinopril (PRINIVIL,ZESTRIL) 2.5 MG tablet Take 1 tablet (2.5 mg  total) by mouth daily.  60 tablet  6  . pantoprazole (PROTONIX) 40 MG tablet Take 1 tablet (40 mg total) by mouth daily.  30 tablet  6  . Simethicone (GAS-X PO) Take by mouth.      Marland Kitchen allopurinol (ZYLOPRIM) 100 MG tablet Take 1 tablet (100 mg total) by mouth daily.  30 tablet  6   No current facility-administered medications on file prior to visit.    BP 100/60  Pulse 87  Wt 230 lb (104.327 kg)chart    Objective:   Physical Exam  Constitutional: He is oriented to person, place, and time. He appears well-developed and well-nourished.  HENT:  Right Ear: External ear normal.  Left Ear: External ear normal.  Nose: Nose normal.  Mouth/Throat: Oropharynx is clear and moist.  Neck: Normal range of motion. Neck supple.  Cardiovascular: Normal rate, regular rhythm and normal heart sounds.   Pulmonary/Chest: Effort normal and breath sounds normal.  Abdominal: Soft. Bowel sounds are normal. He exhibits no distension. There is no tenderness. There is no rebound.  Musculoskeletal: Normal range of motion.  Neurological: He is alert and oriented to person, place, and time. He displays normal reflexes. No cranial nerve deficit. Coordination normal.  Skin: Skin is warm and dry.  Psychiatric: He has a normal mood and affect.          Assessment & Plan:  Assessment: 1. Type 2 diabetes 2. Atrial fibrillation 3. History of CVA with left-sided weakness 4. Atrial fibrillation 5. COPD 6. Chronic kidney disease 7. Gout 8. Hypokalemia  Plan: Continue to followup with specialist as scheduled. Continue current medications. Lab sent today to include CMP, A1c, CBC, uric acid will notify patient and their results. Recheck in 3 months.

## 2013-11-01 NOTE — Addendum Note (Signed)
Addended byAdline Mango B on: 11/01/2013 09:37 AM   Modules accepted: Orders

## 2013-11-02 ENCOUNTER — Telehealth: Payer: Self-pay | Admitting: Family

## 2013-11-02 DIAGNOSIS — M1990 Unspecified osteoarthritis, unspecified site: Secondary | ICD-10-CM

## 2013-11-02 MED ORDER — OXYCODONE-ACETAMINOPHEN 5-325 MG PO TABS: 1.0000 | ORAL_TABLET | Freq: Three times a day (TID) | ORAL | Status: DC | PRN

## 2013-11-02 NOTE — Telephone Encounter (Signed)
Pt states he was supposed to get a rx oxyCODONE-acetaminophen (PERCOCET/ROXICET) 5-325 MG per tablet Yesterday. Pls advise

## 2013-11-02 NOTE — Telephone Encounter (Signed)
Left message for pt to call.

## 2013-11-02 NOTE — Telephone Encounter (Signed)
Rx printed and ready for pick up. Pt's niece is aware and also advised that pt will be referred to pain clinic for chronic pain management.

## 2013-11-02 NOTE — Telephone Encounter (Signed)
I have hospital days next Mon and Tues.  Either day would be fine.

## 2013-11-05 ENCOUNTER — Ambulatory Visit (AMBULATORY_SURGERY_CENTER): Payer: Medicare Other | Admitting: *Deleted

## 2013-11-05 ENCOUNTER — Other Ambulatory Visit: Payer: Self-pay | Admitting: Family

## 2013-11-05 VITALS — Ht 68.0 in | Wt 226.8 lb

## 2013-11-05 DIAGNOSIS — R1310 Dysphagia, unspecified: Secondary | ICD-10-CM

## 2013-11-05 DIAGNOSIS — R197 Diarrhea, unspecified: Secondary | ICD-10-CM

## 2013-11-05 MED ORDER — MOVIPREP 100 G PO SOLR: ORAL | Status: DC

## 2013-11-05 MED ORDER — PREDNISONE 20 MG PO TABS: 20.0000 mg | ORAL_TABLET | Freq: Every day | ORAL | Status: DC

## 2013-11-05 NOTE — Progress Notes (Signed)
Patient denies any allergies to eggs or soy. Patient denies any problems with anesthesia.  

## 2013-11-05 NOTE — Telephone Encounter (Signed)
Pt did not call back Friday. Came in today for his previsit. Will call pt with procedure date and time when scheduled.

## 2013-11-05 NOTE — Progress Notes (Signed)
Notified patient that Linda,RN will be calling him with appointment information. He understands.

## 2013-11-07 ENCOUNTER — Other Ambulatory Visit: Payer: Self-pay

## 2013-11-07 ENCOUNTER — Telehealth: Payer: Self-pay | Admitting: Family

## 2013-11-07 DIAGNOSIS — R109 Unspecified abdominal pain: Secondary | ICD-10-CM

## 2013-11-07 NOTE — Telephone Encounter (Signed)
Dr. Arlyce Dice I scheduled this pt for his ECL at [facility] 12/24/13@12noon . Do you want me to block off your 1st 2 afternoon appts in the office?

## 2013-11-07 NOTE — Telephone Encounter (Signed)
Pt is requesting bp machine and dm meter call into [facility] [address]. Pt also would like pulse oximetry to check his oxygen level

## 2013-11-07 NOTE — Telephone Encounter (Signed)
Frank Davidson notified via voicemail.

## 2013-11-07 NOTE — Telephone Encounter (Signed)
That's fine.  Make sure to show this to Clarnce Flock for her approval of modification of schedule.

## 2013-11-08 ENCOUNTER — Ambulatory Visit (HOSPITAL_COMMUNITY)
Admission: RE | Admit: 2013-11-08 | Discharge: 2013-11-08 | Disposition: A | Discharge status: Home / Self Care | Payer: Medicare Other | Source: Ambulatory Visit | Attending: Internal Medicine | Admitting: Internal Medicine

## 2013-11-08 ENCOUNTER — Encounter: Payer: Self-pay | Admitting: Internal Medicine

## 2013-11-08 ENCOUNTER — Ambulatory Visit (HOSPITAL_BASED_OUTPATIENT_CLINIC_OR_DEPARTMENT_OTHER)
Admission: RE | Admit: 2013-11-08 | Discharge: 2013-11-08 | Disposition: A | Discharge status: Home / Self Care | Payer: Medicare Other | Source: Ambulatory Visit | Attending: Internal Medicine | Admitting: Internal Medicine

## 2013-11-08 VITALS — BP 108/88 | HR 58 | Wt 228.0 lb

## 2013-11-08 DIAGNOSIS — I509 Heart failure, unspecified: Secondary | ICD-10-CM | POA: Insufficient documentation

## 2013-11-08 DIAGNOSIS — E785 Hyperlipidemia, unspecified: Secondary | ICD-10-CM | POA: Insufficient documentation

## 2013-11-08 DIAGNOSIS — I4891 Unspecified atrial fibrillation: Secondary | ICD-10-CM | POA: Insufficient documentation

## 2013-11-08 DIAGNOSIS — I5022 Chronic systolic (congestive) heart failure: Secondary | ICD-10-CM

## 2013-11-08 DIAGNOSIS — M1990 Unspecified osteoarthritis, unspecified site: Secondary | ICD-10-CM | POA: Insufficient documentation

## 2013-11-08 DIAGNOSIS — G4730 Sleep apnea, unspecified: Secondary | ICD-10-CM | POA: Insufficient documentation

## 2013-11-08 DIAGNOSIS — I428 Other cardiomyopathies: Secondary | ICD-10-CM | POA: Insufficient documentation

## 2013-11-08 DIAGNOSIS — R109 Unspecified abdominal pain: Secondary | ICD-10-CM | POA: Insufficient documentation

## 2013-11-08 DIAGNOSIS — I059 Rheumatic mitral valve disease, unspecified: Secondary | ICD-10-CM

## 2013-11-08 DIAGNOSIS — Z79899 Other long term (current) drug therapy: Secondary | ICD-10-CM | POA: Insufficient documentation

## 2013-11-08 DIAGNOSIS — IMO0002 Reserved for concepts with insufficient information to code with codable children: Secondary | ICD-10-CM | POA: Insufficient documentation

## 2013-11-08 DIAGNOSIS — E119 Type 2 diabetes mellitus without complications: Secondary | ICD-10-CM | POA: Insufficient documentation

## 2013-11-08 DIAGNOSIS — I2510 Atherosclerotic heart disease of native coronary artery without angina pectoris: Secondary | ICD-10-CM | POA: Insufficient documentation

## 2013-11-08 DIAGNOSIS — B009 Herpesviral infection, unspecified: Secondary | ICD-10-CM | POA: Insufficient documentation

## 2013-11-08 DIAGNOSIS — Z8673 Personal history of transient ischemic attack (TIA), and cerebral infarction without residual deficits: Secondary | ICD-10-CM | POA: Insufficient documentation

## 2013-11-08 DIAGNOSIS — G8929 Other chronic pain: Secondary | ICD-10-CM | POA: Insufficient documentation

## 2013-11-08 DIAGNOSIS — I5023 Acute on chronic systolic (congestive) heart failure: Secondary | ICD-10-CM

## 2013-11-08 DIAGNOSIS — F1910 Other psychoactive substance abuse, uncomplicated: Secondary | ICD-10-CM | POA: Insufficient documentation

## 2013-11-08 DIAGNOSIS — J4489 Other specified chronic obstructive pulmonary disease: Secondary | ICD-10-CM | POA: Insufficient documentation

## 2013-11-08 DIAGNOSIS — G589 Mononeuropathy, unspecified: Secondary | ICD-10-CM | POA: Insufficient documentation

## 2013-11-08 DIAGNOSIS — M109 Gout, unspecified: Secondary | ICD-10-CM | POA: Insufficient documentation

## 2013-11-08 DIAGNOSIS — J449 Chronic obstructive pulmonary disease, unspecified: Secondary | ICD-10-CM | POA: Insufficient documentation

## 2013-11-08 DIAGNOSIS — N189 Chronic kidney disease, unspecified: Secondary | ICD-10-CM | POA: Insufficient documentation

## 2013-11-08 DIAGNOSIS — IMO0001 Reserved for inherently not codable concepts without codable children: Secondary | ICD-10-CM | POA: Insufficient documentation

## 2013-11-08 DIAGNOSIS — E669 Obesity, unspecified: Secondary | ICD-10-CM | POA: Insufficient documentation

## 2013-11-08 DIAGNOSIS — R29898 Other symptoms and signs involving the musculoskeletal system: Secondary | ICD-10-CM | POA: Insufficient documentation

## 2013-11-08 DIAGNOSIS — I129 Hypertensive chronic kidney disease with stage 1 through stage 4 chronic kidney disease, or unspecified chronic kidney disease: Secondary | ICD-10-CM | POA: Insufficient documentation

## 2013-11-08 MED ORDER — LISINOPRIL 2.5 MG PO TABS: 2.5000 mg | ORAL_TABLET | Freq: Two times a day (BID) | ORAL | Status: DC

## 2013-11-08 MED ORDER — FUROSEMIDE 80 MG PO TABS: 80.0000 mg | ORAL_TABLET | Freq: Every day | ORAL | Status: DC

## 2013-11-08 NOTE — Progress Notes (Signed)
  Echocardiogram 2D Echocardiogram has been performed.  Kaydi Kley FRANCES 11/08/2013, 2:52 PM

## 2013-11-08 NOTE — Patient Instructions (Addendum)
Take lasix 80 mg twice a day for the next 3 days with an extra 20 meq of potassium for the next 3 days  On Sunday take lasix 80 mg daily and 20 meq of potassium daily  Take 2.5 mg lisinopril twice a day  Do the following things EVERYDAY: 1) Weigh yourself in the morning before breakfast. Write it down and keep it in a log. 2) Take your medicines as prescribed 3) Eat low salt foods-Limit salt (sodium) to 2000 mg per day.  4) Stay as active as you can everyday 5) Limit all fluids for the day to less than 2 liters   Follow up in 2-3 weeks

## 2013-11-08 NOTE — Progress Notes (Signed)
Patient ID: Frank Davidson, male   DOB: [birth year], 62 y.o.   MRN: [id number]   Weight Range  229 pounds   Baseline proBNP    PCP: Adline Mango FNP GI: Dr Yves Dill Rheumatologist:   HPI: Frank Davidson is a 62 yo male with a hx of obesity, PAF, HTN, COPD, HTN, gout, DM 2, CVA and CHF due to NICM with EF 20-25% (06/2013).   Has previously followed in the [facility] in [place]. Previous cath in 2007 showed NICM with normal cors. Was living alone in [place] and apparently was issues managing medications by himself and was getting confused with PCP and cardiologists medications and was not taking his meds correctly. So he moved to [place] near his nieces. Has not been on ACE-I due to renal failure. Highest creatinine in our system is ~1.5 but was apparently higher previously - thought to be related to taking his medications incorrectly.   Was admitted in 06/2013 from [facility] with left sided weakness and was found to have a moderate to large right hemispheric infarct. Went back to [facility].   Admitted to [facility] 10/14 through 09/09/13 with ADHF. Diuresed 30 pounds with IV lasix and transitioned to lasix 80 mg po bid. Continued on 25 mg carvedilol bid, isordil 20 mg daily.  Started on lisinopril 2.5 mg daily. Discharge weight 229 pounds.    He returns for follow up. Denies PND/Orthopnea.  No bleeding problems. Complains of dyspnea with exertion and ongoing abdominal discomfort. Able to walk a block but has to stop a couple time along the way. Weight at home trending up to 217 to 224 pounds. He has been out of lasix for 1 week but he has had them filled 3 days ago. Still struggling with medication compliance.  ECHO 11/08/13 EF 15-20% Dr Gala Romney reviewed during OV  Labs 09/07/13 K 3.9 Creatinine 1.18  Labs 09/17/13 dig level 1.7 K 4.4 Creatinine 1.41 Pro BNP 2256 Labs 09/25/13 K 6.1 Creatinine 2.57  Labs 09/28/13  K 5.3 Creatinine 1.98  Labs 10/01/13 Dig level 2.5 --->dig stopped Labs  10/05/13 K 4.8 Creatinine 1.79 Labs 11/01/13 K 3.3 --Supplemented Creatinine 1.3 Uric Acid 9.3 AST 23 ALT 12 Hemoglobin 13 Hemoglobin 7.2   09/29/13 CT abdomen and pelvis - no obstruction . Diverticulosis. No inflammatory changes.   SH: Retired Engineer, 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  Past Medical History  Diagnosis Date  . Fibromyalgia   . Atrial fibrillation   . Hypertension   . Osteoarthritis   . COPD (chronic obstructive pulmonary disease)   . Sleep apnea with use of continuous positive airway pressure (CPAP)   . Diabetes mellitus without complication   . Coronary artery disease   . Neuropathy   . Ocular herpes   . Gout   . CKD (chronic kidney disease), stage III   . CHF (congestive heart failure)     EF 15%  . Polysubstance abuse     Hx of  . Chronic pain   . Hyperlipidemia   . Nonischemic cardiomyopathy   . Stroke   . Ocular herpes   . Neuropathy   . Left-sided weakness   . Acute on chronic systolic heart failure     Current Outpatient Prescriptions  Medication Sig Dispense Refill  . apixaban (ELIQUIS) 5 MG TABS tablet Take 1 tablet (5 mg total) by mouth 2 (  two) times daily.  60 tablet  5  . calcium carbonate (TUMS - DOSED IN MG ELEMENTAL CALCIUM) 500 MG chewable tablet Chew 1 tablet by mouth 3 (three) times daily.      . carvedilol (COREG) 25 MG tablet Take 1 tablet (25 mg total) by mouth 2 (two) times daily with a meal.  60 tablet  5  . furosemide (LASIX) 80 MG tablet Take 1 tablet (80 mg total) by mouth daily.  30 tablet  5  . glipiZIDE (GLUCOTROL) 5 MG tablet Take 1 tablet (5 mg total) by mouth 2 (two) times daily before a meal.  60 tablet  5  . hydrALAZINE (APRESOLINE) 25 MG tablet Take 0.5 tablets (12.5 mg total) by mouth every 8 (eight) hours.  90 tablet  5  . isosorbide mononitrate (IMDUR) 30 MG 24 hr tablet Take 1 tablet (30 mg total) by mouth  daily.  30 tablet  5  . lisinopril (PRINIVIL,ZESTRIL) 2.5 MG tablet Take 1 tablet (2.5 mg total) by mouth daily.  60 tablet  5  . MOVIPREP 100 G SOLR Take as directed  1 kit  0  . oxyCODONE-acetaminophen (PERCOCET/ROXICET) 5-325 MG per tablet Take 1 tablet by mouth every 8 (eight) hours as needed for severe pain.  30 tablet  0  . pantoprazole (PROTONIX) 40 MG tablet Take 1 tablet (40 mg total) by mouth daily.  30 tablet  5  . potassium chloride SA (K-DUR,KLOR-CON) 20 MEQ tablet Take 1 tablet (20 mEq total) by mouth daily.  30 tablet  0  . predniSONE (DELTASONE) 20 MG tablet Take 1 tablet (20 mg total) by mouth daily.  5 tablet  0  . Simethicone (GAS-X PO) Take by mouth.      Marland Kitchen allopurinol (ZYLOPRIM) 100 MG tablet Take 1 tablet (100 mg total) by mouth daily.  30 tablet  5   No current facility-administered medications for this encounter.     PHYSICAL EXAM: Filed Vitals:   11/08/13 1458  BP: 108/88  Pulse: 58  Weight: 228 lb (103.42 kg)  SpO2: 97%    General:  Obese male. Mild dyspnea with exertion into the clinic.  Ambulated into clinic with a walker. Sister present  HEENT: normal Neck: supple. JVP to jaw.  Carotids 2+ bilaterally; no bruits. No lymphadenopathy or thryomegaly appreciated. Cor: PMI normal. Irregular  rate & rhythm. No rubs, gallops or murmurs. Lungs: clear Abdomen: obese soft, nontender, nondistended. No hepatosplenomegaly. No bruits or masses. Good bowel sounds. Extremities: no cyanosis, clubbing, rash, RLE 1+  LLE 2 + woody edema Neuro: alert & orientedx3, cranial nerves grossly intact. Affect pleasant.  ASSESSMENT & PLAN: Enrolled in Guide It Study  1) Chronic Systolic Heart Failure due to NICM.   Dr Gala Romney discussed and reviewed ECHO EF 15-20%.  NYHA IIIb.  Dyspnea on exertion and with hills. Volume status elevated but this is not surprising because he was out of his medications for 1 week. Weight up 7 pounds from last visit. Would like his weigh 220 pounds  or lower.  Double lasix to 80 mg twice a day for the next 3 days with an extra 20 meq of potassium then back to lasix 80 mg daily with 20 meq of potassium  -On goal --->carvedilol 25 mg twice a day. He is not on dig due to elevated dig level noted 10/01/13  -Reviewed BMET from 11/01/13 -Increase lisinopril 2.5 mg twice a day. Repeat BMET next week.   -Continue hydralazine 12.5 mg tid and  Imdur 30 mg daily.  -Reinforced daily weights, low salt food choices, and limiting fluid intake to < 2 liters per day.   Lengthy conversation about his ECHO. Strongly recommended ICD . Referred to EP, in the past he has refused ICD.  Will need CPX at some point. May need advanced therapies down the road.   Check BMET and pro bnp today  2) Gout - Continue allopurinol. Reviewed uric acid from 11/01/13. Uric Acid 9.3. Referral  to rheumatology pending.  3) DM2 - Now has PCP. He will follow up the end of December.  Reviewed HGB A1C from 11/01/13 --->7.2.  4) Atrial fibrillation-  Rate controlled. on eliquis 5 mg bid no bleeding problems  5) Chronic renal failure, stage III  - reviewed BMET from 11/01/13 creatinine down but he was off his medications for a week 6) Abdominal discomfort- plan for colonoscopy in February.   Follow up in 2-3 weeks to reassess volume and continue to titrate meds as tolerated.   CLEGG,AMY NP-C 3:04 PM  Patient seen and examined with Tonye Becket, NP. We discussed all aspects of the encounter. I agree with the assessment and plan as stated above. I reviewed echo imaes in clinic and discussed it with both him and his sister.  Difficult situation. He continues to struggle with advanced HF (NYHA IIIb) and medication non-compliance despite family support. On exam, he is volume overloaded Agree with increasing lasix as above and titrating ACE as tolerated. Extensive discussion about HF education including use of sliding-scale diuretics and need for compliance. Has refused ICD in past but now  willing to consider. Will refer to EP. Continue Eliquis for PAF.   We will continue to follow closely in HF Clinic.   Time spent 45 mins with more than 2/3 of that time discussing above.   Truman Hayward 12:38 PM

## 2013-11-09 MED ORDER — BLOOD PRESSURE MONITOR AUTOMAT DEVI: Status: DC

## 2013-11-09 MED ORDER — FINGERTIP PULSE OXIMETER MISC: Status: DC

## 2013-11-09 MED ORDER — BLOOD GLUCOSE METER KIT: PACK | Status: DC

## 2013-11-09 NOTE — Telephone Encounter (Signed)
Scripts sent to pharmacy 

## 2013-11-14 ENCOUNTER — Ambulatory Visit (INDEPENDENT_AMBULATORY_CARE_PROVIDER_SITE_OTHER): Payer: Medicare Other | Admitting: Internal Medicine

## 2013-11-14 ENCOUNTER — Encounter: Payer: Self-pay | Admitting: Internal Medicine

## 2013-11-14 VITALS — BP 92/58 | HR 55 | Ht 70.0 in | Wt 225.0 lb

## 2013-11-14 DIAGNOSIS — I5022 Chronic systolic (congestive) heart failure: Secondary | ICD-10-CM

## 2013-11-14 DIAGNOSIS — I10 Essential (primary) hypertension: Secondary | ICD-10-CM

## 2013-11-14 NOTE — Assessment & Plan Note (Signed)
His chronic systolic heart failure is currently well compensated. He will continue his current medical therapy. We discussed the indications, risks, and if it's, and expectations of prophylactic ICD implantation for primary prevention of malignant ventricular arrhythmias. He is considering his options and will call us if you would like to proceed with ICD implantation.

## 2013-11-14 NOTE — Progress Notes (Signed)
HPI [name] is referred today by Dr. Teressa Lower for evaluation for consideration for ICD implant. The patient is a 62 year old man with a long-standing history of a nonischemic cardiomyopathy. He was diagnosed with congestive heart failure initially in 2004. Since then he has been on maximal medical therapy, currently with carvedilol, furosemide, hydralazine, isosorbide, lisinopril, and anticoagulation secondary to atrial fibrillation. He has not had syncope. His heart failure is class IIB. The patient has had ICD was recommended to him in the past but has always refused. He was living in [place], but is moved to [place] in the last year. He denies peripheral edema currently although he has had in the past. Allergies  Allergen Reactions  . Actos [Pioglitazone] Other (See Comments)    Caused blood in urine and fluid retention      Current Outpatient Prescriptions  Medication Sig Dispense Refill  . allopurinol (ZYLOPRIM) 100 MG tablet Take 1 tablet (100 mg total) by mouth daily.  30 tablet  5  . apixaban (ELIQUIS) 5 MG TABS tablet Take 1 tablet (5 mg total) by mouth 2 (two) times daily.  60 tablet  5  . calcium carbonate (TUMS - DOSED IN MG ELEMENTAL CALCIUM) 500 MG chewable tablet Chew 1 tablet by mouth 3 (three) times daily.      . carvedilol (COREG) 25 MG tablet Take 1 tablet (25 mg total) by mouth 2 (two) times daily with a meal.  60 tablet  5  . furosemide (LASIX) 80 MG tablet Take 1 tablet (80 mg total) by mouth daily. 80 mg twice a day for three days then 80 mg daily and as needed  60 tablet  5  . glipiZIDE (GLUCOTROL) 5 MG tablet Take 1 tablet (5 mg total) by mouth 2 (two) times daily before a meal.  60 tablet  5  . hydrALAZINE (APRESOLINE) 25 MG tablet Take 0.5 tablets (12.5 mg total) by mouth every 8 (eight) hours.  90 tablet  5  . isosorbide mononitrate (IMDUR) 30 MG 24 hr tablet Take 1 tablet (30 mg total) by mouth daily.  30 tablet  5  . lisinopril (PRINIVIL,ZESTRIL)  2.5 MG tablet Take 1 tablet (2.5 mg total) by mouth 2 (two) times daily.  60 tablet  5  . MOVIPREP 100 G SOLR Take as directed  1 kit  0  . oxyCODONE-acetaminophen (PERCOCET/ROXICET) 5-325 MG per tablet Take 1 tablet by mouth every 8 (eight) hours as needed for severe pain.  30 tablet  0  . pantoprazole (PROTONIX) 40 MG tablet Take 1 tablet (40 mg total) by mouth daily.  30 tablet  5  . potassium chloride SA (K-DUR,KLOR-CON) 20 MEQ tablet Take 1 tablet (20 mEq total) by mouth daily.  30 tablet  0   No current facility-administered medications for this visit.     Past Medical History  Diagnosis Date  . Fibromyalgia   . Atrial fibrillation   . Hypertension   . Osteoarthritis   . COPD (chronic obstructive pulmonary disease)   . Sleep apnea with use of continuous positive airway pressure (CPAP)   . Diabetes mellitus without complication   . Coronary artery disease   . Neuropathy   . Ocular herpes   . Gout   . CKD (chronic kidney disease), stage III   . CHF (congestive heart failure)     EF 15%  . Polysubstance abuse     Hx of  . Chronic pain   . Hyperlipidemia   .  Nonischemic cardiomyopathy   . Stroke   . Ocular herpes   . Neuropathy   . Left-sided weakness   . Acute on chronic systolic heart failure     ROS:   All systems reviewed and negative except as noted in the HPI.   Past Surgical History  Procedure Laterality Date  . 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 Cardiac catheterization  10/2006    normal coronary arteries  . Tonsillectomy       Family History  Problem Relation Age of Onset  . Cancer Mother   . Hypertension Mother   . Diabetes Father   . Diabetes Sister   . Hypertension Sister   . Hypertension Brother   . Hypertension Sister   . Colon cancer Neg Hx      History   Social History  . Marital Status: Divorced    Spouse Name: N/A    Number of Children: N/A  . Years of Education: N/A   Occupational History  . Not on file.   Social  History Main Topics  . Smoking status: Former Smoker    Quit date: 12/18/2006  . Smokeless tobacco: Never Used  . Alcohol Use: No  . Drug Use: No  . Sexual Activity: Not on file   Other Topics Concern  . Not on file   Social History Narrative  . No narrative on file     BP 92/58  Pulse 55  Ht 5\' 10"  (1.778 m)  Wt 225 lb (102.059 kg)  BMI 32.28 kg/m2  Physical Exam:  Well appearing middle-aged man, NAD HEENT: Unremarkable Neck:  No JVD, no thyromegally Back:  No CVA tenderness Lungs:  Clear with no wheezes, rales, or rhonchi. HEART:  Regular rate rhythm, no murmurs, no rubs, no clicks Abd:  soft, positive bowel sounds, no organomegally, no rebound, no guarding Ext:  2 plus pulses, no edema, no cyanosis, no clubbing Skin:  No rashes no nodules Neuro:  CN II through XII intact, motor grossly intact  EKG - atrial fibrillation with a controlled ventricular response   Assess/Plan:

## 2013-11-14 NOTE — Assessment & Plan Note (Signed)
His blood pressure is now well-controlled. He will continue his current medical therapy and will maintain a low-sodium diet.

## 2013-11-20 ENCOUNTER — Encounter: Payer: Self-pay | Admitting: Physical Medicine & Rehabilitation

## 2013-11-21 ENCOUNTER — Ambulatory Visit (INDEPENDENT_AMBULATORY_CARE_PROVIDER_SITE_OTHER): Payer: Medicare Other | Admitting: Family

## 2013-11-21 ENCOUNTER — Encounter: Payer: Self-pay | Admitting: Family

## 2013-11-21 VITALS — BP 114/70 | HR 69 | Wt 228.0 lb

## 2013-11-21 DIAGNOSIS — I509 Heart failure, unspecified: Secondary | ICD-10-CM

## 2013-11-21 DIAGNOSIS — M109 Gout, unspecified: Secondary | ICD-10-CM

## 2013-11-21 DIAGNOSIS — E876 Hypokalemia: Secondary | ICD-10-CM

## 2013-11-21 DIAGNOSIS — I10 Essential (primary) hypertension: Secondary | ICD-10-CM

## 2013-11-21 LAB — BASIC METABOLIC PANEL
BUN: 22 mg/dL (ref 6–23)
CO2: 26 mEq/L (ref 19–32)
Chloride: 105 mEq/L (ref 96–112)
Creatinine, Ser: 1.3 mg/dL (ref 0.4–1.5)
Glucose, Bld: 53 mg/dL — ABNORMAL LOW (ref 70–99)
Potassium: 4.4 mEq/L (ref 3.5–5.1)

## 2013-11-21 NOTE — Progress Notes (Signed)
Subjective:    Patient ID: Frank Davidson, male    DOB: 07/04/1951, 62 y.o.   MRN: [id number]  HPI 62 year old African American male, with a history of type 2 diabetes, congestive heart failure, atrial fibrillation, chronic kidney disease, COPD is in today for recheck of hypokalemia. He was found to have a potassium of 3.3 at his last office visit. Therefore, he increase his potassium to twice a day. He is tolerating the medication well.   Review of Systems  Constitutional: Negative.   HENT: Negative.   Respiratory: Negative.   Cardiovascular: Negative.   Genitourinary: Negative.   Musculoskeletal: Negative.   Skin: Negative.   Allergic/Immunologic: Negative.   Neurological: Negative.   Hematological: Negative.   Psychiatric/Behavioral: Negative.    Past Medical History  Diagnosis Date  . Fibromyalgia   . Atrial fibrillation   . Hypertension   . Osteoarthritis   . COPD (chronic obstructive pulmonary disease)   . Sleep apnea with use of continuous positive airway pressure (CPAP)   . Diabetes mellitus without complication   . Coronary artery disease   . Neuropathy   . Ocular herpes   . Gout   . CKD (chronic kidney disease), stage III   . CHF (congestive heart failure)     EF 15%  . Polysubstance abuse     Hx of  . Chronic pain   . Hyperlipidemia   . Nonischemic cardiomyopathy   . Stroke   . Ocular herpes   . Neuropathy   . Left-sided weakness   . Acute on chronic systolic heart failure     History   Social History  . Marital Status: Divorced    Spouse Name: N/A    Number of Children: N/A  . Years of Education: N/A   Occupational History  . Not on file.   Social History Main Topics  . Smoking status: Former Smoker    Quit date: 12/18/2006  . Smokeless tobacco: Never Used  . Alcohol Use: No  . Drug Use: No  . Sexual Activity: Not on file   Other Topics Concern  . Not on file   Social History Narrative  . No narrative on file    Past Surgical  History  Procedure Laterality Date  . 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 Cardiac catheterization  10/2006    normal coronary arteries  . Tonsillectomy      Family History  Problem Relation Age of Onset  . Cancer Mother   . Hypertension Mother   . Diabetes Father   . Diabetes Sister   . Hypertension Sister   . Hypertension Brother   . Hypertension Sister   . Colon cancer Neg Hx     Allergies  Allergen Reactions  . Actos [Pioglitazone] Other (See Comments)    Caused blood in urine and fluid retention     Current Outpatient Prescriptions on File Prior to Visit  Medication Sig Dispense Refill  . allopurinol (ZYLOPRIM) 100 MG tablet Take 1 tablet (100 mg total) by mouth daily.  30 tablet  5  . apixaban (ELIQUIS) 5 MG TABS tablet Take 1 tablet (5 mg total) by mouth 2 (two) times daily.  60 tablet  5  . calcium carbonate (TUMS - DOSED IN MG ELEMENTAL CALCIUM) 500 MG chewable tablet Chew 1 tablet by mouth 3 (three) times daily.      . carvedilol (COREG) 25 MG tablet Take 1 tablet (25 mg total) by mouth 2 (two)  times daily with a meal.  60 tablet  5  . furosemide (LASIX) 80 MG tablet Take 1 tablet (80 mg total) by mouth daily. 80 mg twice a day for three days then 80 mg daily and as needed  60 tablet  5  . glipiZIDE (GLUCOTROL) 5 MG tablet Take 1 tablet (5 mg total) by mouth 2 (two) times daily before a meal.  60 tablet  5  . hydrALAZINE (APRESOLINE) 25 MG tablet Take 0.5 tablets (12.5 mg total) by mouth every 8 (eight) hours.  90 tablet  5  . isosorbide mononitrate (IMDUR) 30 MG 24 hr tablet Take 1 tablet (30 mg total) by mouth daily.  30 tablet  5  . lisinopril (PRINIVIL,ZESTRIL) 2.5 MG tablet Take 1 tablet (2.5 mg total) by mouth 2 (two) times daily.  60 tablet  5  . MOVIPREP 100 G SOLR Take as directed  1 kit  0  . oxyCODONE-acetaminophen (PERCOCET/ROXICET) 5-325 MG per tablet Take 1 tablet by mouth every 8 (eight) hours as needed for severe pain.  30 tablet  0  . pantoprazole  (PROTONIX) 40 MG tablet Take 1 tablet (40 mg total) by mouth daily.  30 tablet  5  . potassium chloride SA (K-DUR,KLOR-CON) 20 MEQ tablet Take 1 tablet (20 mEq total) by mouth daily.  30 tablet  0   No current facility-administered medications on file prior to visit.    BP 114/70  Pulse 69  Wt 228 lb (103.42 kg)chart    Objective:   Physical Exam  Constitutional: He is oriented to person, place, and time. He appears well-developed and well-nourished.  HENT:  Right Ear: External ear normal.  Left Ear: External ear normal.  Nose: Nose normal.  Mouth/Throat: Oropharynx is clear and moist.  Neck: Normal range of motion. Neck supple.  Cardiovascular: Normal rate, regular rhythm and normal heart sounds.   Pulmonary/Chest: Effort normal and breath sounds normal.  Musculoskeletal: Normal range of motion.  Neurological: He is alert and oriented to person, place, and time.  Skin: Skin is warm and dry.  Psychiatric: He has a normal mood and affect.          Assessment & Plan:  Assessment: 1. Hypokalemia 2. Type 2 diabetes 3. Congestive heart failure 4. Atrial fibrillation 5. Chronic kidney disease   Plan: BMP sent today. Notify patient of results. Encouraged healthy diet, low sodium increased potassium. Followup in 3 months and sooner as needed.

## 2013-11-21 NOTE — Patient Instructions (Signed)
Potassium Content of Foods Potassium is a mineral found in many foods and drinks. It helps keep fluids and minerals balanced in your body and also affects how steadily your heart beats. The body needs potassium to control blood pressure and to keep the muscles and nervous system healthy. However, certain health conditions and medicine may require you to eat more or less potassium-rich foods and drinks. Your caregiver or dietitian will tell you how much potassium you should have each day. COMMON SERVING SIZES The list below tells you how big or small common portion sizes are:  1 oz.........4 stacked dice.  3 oz.........Deck of cards.  1 tsp........Tip of little finger.  1 tbsp......Thumb.  2 tbsp......Golf ball.   c...........Half of a fist.  1 c............A fist. FOODS AND DRINKS HIGH IN POTASSIUM More than 200 mg of potassium per serving. A serving size is  c (120 mL or noted gram weight) unless otherwise stated. While all the items on this list are high in potassium, some items are higher in potassium than others. Fruits  Apricots (sliced), 83 g.  Apricots (dried halves), 3 oz / 24 g.  Avocado (cubed),  c / 50 g.  Banana (sliced), 75 g.  Cantaloupe (cubed), 80 g.  Dates (pitted), 5 whole / 35 g.  Figs (dried), 4 whole / 32 g.  Guava, c / 55 g.  Honeydew, 1 wedge / 85 g.  Kiwi (sliced), 90 g.  Nectarine, 1 small / 129 g.  Orange, 1 medium / 131 g.  Orange juice.  Pomegranate seeds, 87 g.  Pomegranate juice.  Prunes (pitted), 3 whole / 30 g.  Prune juice, 3 oz / 90 mL.  Seedless raisins, 3 tbsp / 27 g. Vegetables  Artichoke,  of a medium / 64 g.  Asparagus (boiled), 90 g.  Baked beans,  c / 63 g.  Bamboo shoots,  c / 38 g.  Beets (cooked slices), 85 g.  Broccoli (boiled), 78 g.  Brussels sprout (boiled), 78 g.  Butternut squash (baked), 103 g.  Chickpea (cooked), 82 g.  Green peas (cooked), 80 g.  Hubbard squash (baked cubes),  c /  68 g.  Kidney beans (cooked), 5 tbsp / 55 g.  Lima beans (cooked),  c / 43 g.  Navy beans (cooked),  c / 61 g.  Potato (baked), 61 g.  Potato (boiled), 78 g.  Pumpkin (boiled), 123 g.  Refried beans,  c / 79 g.  Spinach (cooked),  c / 45 g.  Split peas (cooked),  c / 65 g.  Sun-dried tomatoes, 2 tbsp / 7 g.  Sweet potato (baked),  c / 50 g.  Tomato (chopped or sliced), 90 g.  Tomato juice.  Tomato paste, 4 tsp / 21 g.  Tomato sauce,  c / 61 g.  Vegetable juice.  White mushrooms (cooked), 78 g.  Yam (cooked or baked),  c / 34 g.  Zucchini squash (boiled), 90 g. Other Foods and Drinks  Almonds (whole),  c / 36 g.  Cashews (oil roasted),  c / 32 g.  Chocolate milk.  Chocolate pudding, 142 g.  Clams (steamed), 1.5 oz / 43 g.  Dark chocolate, 1.5 oz / 42 g.  Fish, 3 oz / 85 g.  King crab (steamed), 3 oz / 85 g.  Lobster (steamed), 4 oz / 113 g.  Milk (skim, 1%, 2%, whole), 1 c / 240 mL.  Milk chocolate, 2.3 oz / 66 g.  Milk shake.  Nonfat fruit   variety yogurt, 123 g.  Peanuts (oil roasted), 1 oz / 28 g.  Peanut butter, 2 tbsp / 32 g.  Pistachio nuts, 1 oz / 28 g.  Pumpkin seeds, 1 oz / 28 g.  Red meat (broiled, cooked, grilled), 3 oz / 85 g.  Scallops (steamed), 3 oz / 85 g.  Shredded wheat cereal (dry), 3 oblong biscuits / 75 g.  Spaghetti sauce,  c / 66 g.  Sunflower seeds (dry roasted), 1 oz / 28 g.  Veggie burger, 1 patty / 70 g. FOODS MODERATE IN POTASSIUM Between 150 mg and 200 mg per serving. A serving is  c (120 mL or noted gram weight) unless otherwise stated. Fruits  Grapefruit,  of the fruit / 123 g.  Grapefruit juice.  Pineapple juice.  Plums (sliced), 83 g.  Tangerine, 1 large / 120 g. Vegetables  Carrots (boiled), 78 g.  Carrots (sliced), 61 g.  Rhubarb (cooked with sugar), 120 g.  Rutabaga (cooked), 120 g.  Sweet corn (cooked), 75 g.  Yellow snap beans (cooked), 63 g. Other Foods and  Drinks   Bagel, 1 bagel / 98 g.  Chicken breast (roasted and chopped),  c / 70 g.  Chocolate ice cream / 66 g.  Pita bread, 1 large / 64 g.  Shrimp (steamed), 4 oz / 113 g.  Swiss cheese (diced), 70 g.  Vanilla ice cream, 66 g.  Vanilla pudding, 140 g. FOODS LOW IN POTASSIUM Less than 150 mg per serving. A serving size is  cup (120 mL or noted gram weight) unless otherwise stated. If you eat more than 1 serving of a food low in potassium, the food may be considered a food high in potassium. Fruits  Apple (slices), 55 g.  Apple juice.  Applesauce, 122 g.  Blackberries, 72 g.  Blueberries, 74 g.  Cranberries, 50 g.  Cranberry juice.  Fruit cocktail, 119 g.  Fruit punch.  Grapes, 46 g.  Grape juice.  Mandarin oranges (canned), 126 g.  Peach (slices), 77 g.  Pineapple (chunks), 83 g.  Raspberries, 62 g.  Red cherries (without pits), 78 g.  Strawberries (sliced), 83 g.  Watermelon (diced), 76 g. Vegetables  Alfalfa sprouts, 17 g.  Bell peppers (sliced), 46 g.  Cabbage (shredded), 35 g.  Cauliflower (boiled), 62 g.  Celery, 51 g.  Collard greens (boiled), 95 g.  Cucumber (sliced), 52 g.  Eggplant (cubed), 41 g.  Green beans (boiled), 63 g.  Lettuce (shredded), 1 c / 36 g.  Onions (sauteed), 44 g.  Radishes (sliced), 58 g.  Spaghetti squash, 51 g. Other Foods and Drinks  Angel food cake, 1 slice / 28 g.  Black tea.  Brown rice (cooked), 98 g.  Butter croissant, 1 medium / 57 g.  Carbonated soda.  Coffee.  Cheddar cheese (diced), 66 g.  Corn flake cereal (dry), 14 g.  Cottage cheese, 118 g.  Cream of rice cereal (cooked), 122 g.  Cream of wheat cereal (cooked), 126 g.  Crisped rice cereal (dry), 14 g.  Egg (boiled, fried, poached, omelet, scrambled), 1 large / 46 61 g.  English muffin, 1 muffin / 57 g.  Frozen ice pop, 1 pop / 55 g.  Graham cracker, 1 large rectangular cracker / 14 g.  Jelly beans, 112  g.  Non-dairy whipped topping.  Oatmeal, 88 g.  Orange sherbet, 74 g.  Puffed rice cereal (dry), 7 g.  Pasta (cooked), 70 g.  Rice cakes, 4 cakes / 36   g.  Sugared doughnut, 4 oz / 116 g.  White bread, 1 slice / 30 g.  White rice (cooked), 79 93 g.  Wild rice (cooked), 82 g.  Yellow cake, 1 slice / 68 g. Document Released: 06/22/2005 Document Revised: 10/25/2012 Document Reviewed: 03/24/2012 Exit[employer] Patient Information 2014 [employer].  

## 2013-11-28 ENCOUNTER — Encounter (HOSPITAL_COMMUNITY): Payer: Self-pay

## 2013-11-28 ENCOUNTER — Telehealth: Payer: Self-pay | Admitting: Family

## 2013-11-28 ENCOUNTER — Ambulatory Visit (HOSPITAL_COMMUNITY)
Admission: RE | Admit: 2013-11-28 | Discharge: 2013-11-28 | Disposition: A | Discharge status: Home / Self Care | Payer: Medicare HMO | Source: Ambulatory Visit | Attending: Internal Medicine | Admitting: Internal Medicine

## 2013-11-28 ENCOUNTER — Encounter: Payer: Self-pay | Admitting: Internal Medicine

## 2013-11-28 VITALS — BP 98/56 | HR 82 | Wt 223.0 lb

## 2013-11-28 DIAGNOSIS — M109 Gout, unspecified: Secondary | ICD-10-CM | POA: Insufficient documentation

## 2013-11-28 DIAGNOSIS — N183 Chronic kidney disease, stage 3 unspecified: Secondary | ICD-10-CM | POA: Insufficient documentation

## 2013-11-28 DIAGNOSIS — G8929 Other chronic pain: Secondary | ICD-10-CM | POA: Insufficient documentation

## 2013-11-28 DIAGNOSIS — G589 Mononeuropathy, unspecified: Secondary | ICD-10-CM | POA: Insufficient documentation

## 2013-11-28 DIAGNOSIS — I5022 Chronic systolic (congestive) heart failure: Secondary | ICD-10-CM | POA: Insufficient documentation

## 2013-11-28 DIAGNOSIS — E669 Obesity, unspecified: Secondary | ICD-10-CM | POA: Insufficient documentation

## 2013-11-28 DIAGNOSIS — J4489 Other specified chronic obstructive pulmonary disease: Secondary | ICD-10-CM | POA: Insufficient documentation

## 2013-11-28 DIAGNOSIS — G4730 Sleep apnea, unspecified: Secondary | ICD-10-CM | POA: Insufficient documentation

## 2013-11-28 DIAGNOSIS — Z8673 Personal history of transient ischemic attack (TIA), and cerebral infarction without residual deficits: Secondary | ICD-10-CM | POA: Insufficient documentation

## 2013-11-28 DIAGNOSIS — I428 Other cardiomyopathies: Secondary | ICD-10-CM | POA: Insufficient documentation

## 2013-11-28 DIAGNOSIS — M1990 Unspecified osteoarthritis, unspecified site: Secondary | ICD-10-CM

## 2013-11-28 DIAGNOSIS — I509 Heart failure, unspecified: Secondary | ICD-10-CM | POA: Insufficient documentation

## 2013-11-28 DIAGNOSIS — I129 Hypertensive chronic kidney disease with stage 1 through stage 4 chronic kidney disease, or unspecified chronic kidney disease: Secondary | ICD-10-CM | POA: Insufficient documentation

## 2013-11-28 DIAGNOSIS — E119 Type 2 diabetes mellitus without complications: Secondary | ICD-10-CM | POA: Insufficient documentation

## 2013-11-28 DIAGNOSIS — I2510 Atherosclerotic heart disease of native coronary artery without angina pectoris: Secondary | ICD-10-CM | POA: Insufficient documentation

## 2013-11-28 DIAGNOSIS — J449 Chronic obstructive pulmonary disease, unspecified: Secondary | ICD-10-CM | POA: Insufficient documentation

## 2013-11-28 DIAGNOSIS — I4891 Unspecified atrial fibrillation: Secondary | ICD-10-CM | POA: Insufficient documentation

## 2013-11-28 NOTE — Telephone Encounter (Signed)
Once the referral is placed, typically, location depends on pt's insurance. That is handled by the [facility]. The referral has been placed and pt should receive a call from clinic to schedule

## 2013-11-28 NOTE — Telephone Encounter (Signed)
Pt would like to know the name of the pain management clinic you referred him to. I could not find in chart

## 2013-11-28 NOTE — Progress Notes (Signed)
Patient ID: Frank Davidson, male   DOB: 1950/12/17, 63 y.o.   MRN: [id number]   Weight Range  229 pounds   Baseline proBNP    PCP: Roxy Cedar FNP GI: Dr Reed Breech Rheumatologist:   HPI: Frank Davidson is a 63 yo male with a hx of obesity, PAF, HTN, COPD, HTN, gout, DM 2, CVA and CHF due to NICM with EF 20-25% (06/2013).   Has previously followed in the [facility] in [place]. Previous cath in 2007 showed NICM with normal cors. Was living alone in [place] and apparently was issues managing medications by himself and was getting confused with PCP and cardiologists medications and was not taking his meds correctly. So he moved to [place] near his nieces. Has not been on ACE-I due to renal failure. Highest creatinine in our system is ~1.5 but was apparently higher previously - thought to be related to taking his medications incorrectly.   Was admitted in 06/2013 from [facility] with left sided weakness and was found to have a moderate to large right hemispheric infarct. Went back to [facility].   Admitted to [facility] 10/14 through 09/09/13 with ADHF. Diuresed 30 pounds with IV lasix and transitioned to lasix 80 mg po bid. Continued on 25 mg carvedilol bid, isordil 20 mg daily.  Started on lisinopril 2.5 mg daily. Discharge weight 229 pounds.    He returns for follow up. At his last office visit he was instructed to double lasix for 3 days. Weight went down to 210 but now back up to 219 pounds. He also was offered ICD by Dr Lovena Le however he is was unsure he wanted to pursue. He does not want an ICD. Denies SOB/PND/Orthopnea/dizziness. No bleeding problems. Taking all medications and he has all medications. He increased hydralazine to 25 mg three times a day because he says he couldn't cut the pill. Following low salt diet and limiting fluid intake to < 2 liters per day.   ECHO 11/08/13 EF 15%   Labs 09/07/13 K 3.9 Creatinine 1.18  Labs 09/17/13 dig level 1.7 K 4.4 Creatinine 1.41 Pro BNP  2256 Labs 09/25/13 K 6.1 Creatinine 2.57  Labs 09/28/13  K 5.3 Creatinine 1.98  Labs 10/01/13 Dig level 2.5 --->dig stopped Labs 10/05/13 K 4.8 Creatinine 1.79 Labs 11/01/13 K 3.3 --Supplemented Creatinine 1.3 Uric Acid 9.3 AST 23 ALT 12 Hemoglobin 13 Hemoglobin 7.2  Labs 11/21/13 K 4.4 Creatinine 1.3   09/29/13 CT abdomen and pelvis - no obstruction . Diverticulosis. No inflammatory changes.   SH: Retired Education officer, [employer] 2011 Lives with his niece  FH: Father DM  Mom had lung cancer  ROS: All systems negative except as listed in HPI, PMH and Problem List.  Past Medical History  Diagnosis Date  . Fibromyalgia   . Atrial fibrillation   . Hypertension   . Osteoarthritis   . COPD (chronic obstructive pulmonary disease)   . Sleep apnea with use of continuous positive airway pressure (CPAP)   . Diabetes mellitus without complication   . Coronary artery disease   . Neuropathy   . Ocular herpes   . Gout   . CKD (chronic kidney disease), stage III   . CHF (congestive heart failure)     EF 15%  . Polysubstance abuse     Hx of  . Chronic pain   . Hyperlipidemia   . Nonischemic cardiomyopathy   . Stroke   . Ocular herpes   . Neuropathy   . Left-sided weakness   .  Acute on chronic systolic heart failure     Current Outpatient Prescriptions  Medication Sig Dispense Refill  . allopurinol (ZYLOPRIM) 100 MG tablet Take 1 tablet (100 mg total) by mouth daily.  30 tablet  5  . apixaban (ELIQUIS) 5 MG TABS tablet Take 1 tablet (5 mg total) by mouth 2 (two) times daily.  60 tablet  5  . calcium carbonate (TUMS - DOSED IN MG ELEMENTAL CALCIUM) 500 MG chewable tablet Chew 1 tablet by mouth 3 (three) times daily.      . carvedilol (COREG) 25 MG tablet Take 1 tablet (25 mg total) by mouth 2 (two) times daily with a meal.  60 tablet  5  . furosemide (LASIX) 80 MG tablet Take 80 mg by mouth daily.      Marland Kitchen glipiZIDE (GLUCOTROL) 5 MG tablet Take 1 tablet (5 mg total) by mouth 2 (two) times daily  before a meal.  60 tablet  5  . hydrALAZINE (APRESOLINE) 25 MG tablet Take 25 mg by mouth every 8 (eight) hours.      . isosorbide mononitrate (IMDUR) 30 MG 24 hr tablet Take 1 tablet (30 mg total) by mouth daily.  30 tablet  5  . lisinopril (PRINIVIL,ZESTRIL) 2.5 MG tablet Take 1 tablet (2.5 mg total) by mouth 2 (two) times daily.  60 tablet  5  . MOVIPREP 100 G SOLR Take as directed  1 kit  0  . oxyCODONE-acetaminophen (PERCOCET/ROXICET) 5-325 MG per tablet Take 1 tablet by mouth every 8 (eight) hours as needed for severe pain.  30 tablet  0  . pantoprazole (PROTONIX) 40 MG tablet Take 1 tablet (40 mg total) by mouth daily.  30 tablet  5  . potassium chloride SA (K-DUR,KLOR-CON) 20 MEQ tablet Take 1 tablet (20 mEq total) by mouth daily.  30 tablet  0   No current facility-administered medications for this encounter.     PHYSICAL EXAM: Filed Vitals:   11/28/13 1037  BP: 98/56  Pulse: 82  Weight: 223 lb (101.152 kg)  SpO2: 99%    General:  Obese male. Mild dyspnea with exertion into the clinic.  Ambulated into clinic with a walker. Sister present  HEENT: normal Neck: supple. JVP 5-6.  Carotids 2+ bilaterally; no bruits. No lymphadenopathy or thryomegaly appreciated. Cor: PMI normal. Irregular  rate & rhythm. No rubs, gallops or murmurs. Lungs: clear Abdomen: obese soft, nontender, nondistended. No hepatosplenomegaly. No bruits or masses. Good bowel sounds. Extremities: no cyanosis, clubbing, rash, RLE and LLE trace edema.  Neuro: alert & orientedx3, cranial nerves grossly intact. Affect pleasant.  ASSESSMENT & PLAN: Enrolled in Guide It Study  1) Chronic Systolic Heart Failure due to NICM. ECHO 11/08/13 EF 15-20%    NYHA IIIb. Functionally improved. Able to walk to back of the grocery slowly. Volume status much improved. Continue lasix 80 mg daily. Instructed to take an additional 80 mg of lasix if his weight is greater 219 pounds.  -On goal --->carvedilol 25 mg twice a day. He is  not on dig due to elevated dig level noted 10/01/13  -Continue lisinopril 2.5 mg twice a day. Continue hydralazine 25 mg three times a day (he increased on his own ) and Imdur 30 mg daily.  Will not titrate HF meds due to soft bp.   -Reinforced daily weights, low salt food choices, and limiting fluid intake to < 2 liters per day.   He met with Dr Lovena Le and he does not want ICD.  2)  Gout - Continue allopurinol.  Referral  to rheumatology pending and scheduled for 12/03/13 .  3) Atrial fibrillation-  Rate controlled. on eliquis 5 mg bid . no bleeding problems  4) Chronic renal failure, stage III  - reviewed BMET from 11/21/13 creatinine stable 1.3    Follow up 3- 4 weeks  Frank Davidson,AMY NP-C 10:43 AM  Patient seen and examined with Darrick Grinder, NP. We discussed all aspects of the encounter. I agree with the assessment and plan as stated above.   Volume status much improved this week after increasing diuretics. Reinforced need for daily weights and reviewed use of sliding scale diuretics. BP too soft to titrate HF meds further at this point. Continue Eliquis for AF.   Benay Spice 5:17 PM

## 2013-11-28 NOTE — Patient Instructions (Signed)
Follow up in   Do the following things EVERYDAY: 1) Weigh yourself in the morning before breakfast. Write it down and keep it in a log. 2) Take your medicines as prescribed 3) Eat low salt foods-Limit salt (sodium) to 2000 mg per day.  4) Stay as active as you can everyday 5) Limit all fluids for the day to less than 2 liters 

## 2013-12-03 ENCOUNTER — Encounter (HOSPITAL_COMMUNITY): Payer: Self-pay | Admitting: Pharmacy Technician

## 2013-12-03 NOTE — Telephone Encounter (Signed)
Pt following up on request.for pain management. Has not heard for anyone. pls advise Pt called Dr Penelope Galas, but they need [place] to send  a referral/ pls advise. Pt's gauardan and Niece [phone]

## 2013-12-04 ENCOUNTER — Telehealth: Payer: Self-pay

## 2013-12-04 MED ORDER — OXYCODONE-ACETAMINOPHEN 5-325 MG PO TABS: 1.0000 | ORAL_TABLET | Freq: Three times a day (TID) | ORAL | Status: DC | PRN

## 2013-12-04 NOTE — Telephone Encounter (Signed)
One month only, this one time.

## 2013-12-04 NOTE — Telephone Encounter (Signed)
Pt aware.

## 2013-12-04 NOTE — Telephone Encounter (Signed)
Pt has an appt scheduled with pain management on 12/31/13. Pt would like to know if PCP will refill percocet for one month to get through to pain clinic. Advised pt that PCP does not manage chronic pain and if an Rx has been provided previously a Rx will likely not be given. Pt asked that I ask PCP. It doesn't appear that PCP has provided a script for pt in the past. Please advise

## 2013-12-06 ENCOUNTER — Encounter (HOSPITAL_COMMUNITY): Payer: Self-pay | Admitting: *Deleted

## 2013-12-07 ENCOUNTER — Encounter: Payer: Medicare Other | Admitting: Gastroenterology

## 2013-12-19 ENCOUNTER — Encounter: Payer: Self-pay | Admitting: Family

## 2013-12-20 ENCOUNTER — Telehealth: Payer: Self-pay | Admitting: Gastroenterology

## 2013-12-20 NOTE — Telephone Encounter (Signed)
Pt called to confirm the date of his hospital procedure and what time to be there. Reviewed instructions with pt.

## 2013-12-24 ENCOUNTER — Encounter (HOSPITAL_COMMUNITY): Payer: Self-pay | Admitting: *Deleted

## 2013-12-24 ENCOUNTER — Encounter (HOSPITAL_COMMUNITY)
Admission: RE | Disposition: A | Discharge status: Home / Self Care | Payer: Self-pay | Source: Ambulatory Visit | Attending: Gastroenterology

## 2013-12-24 ENCOUNTER — Ambulatory Visit (HOSPITAL_COMMUNITY): Payer: Medicare HMO | Admitting: Anesthesiology

## 2013-12-24 ENCOUNTER — Ambulatory Visit (HOSPITAL_COMMUNITY)
Admission: RE | Admit: 2013-12-24 | Discharge: 2013-12-24 | Disposition: A | Discharge status: Home / Self Care | Payer: Medicare HMO | Source: Ambulatory Visit | Attending: Gastroenterology | Admitting: Gastroenterology

## 2013-12-24 ENCOUNTER — Encounter (HOSPITAL_COMMUNITY): Payer: Medicare HMO | Admitting: Anesthesiology

## 2013-12-24 DIAGNOSIS — K208 Other esophagitis without bleeding: Secondary | ICD-10-CM | POA: Insufficient documentation

## 2013-12-24 DIAGNOSIS — E119 Type 2 diabetes mellitus without complications: Secondary | ICD-10-CM | POA: Insufficient documentation

## 2013-12-24 DIAGNOSIS — J449 Chronic obstructive pulmonary disease, unspecified: Secondary | ICD-10-CM | POA: Insufficient documentation

## 2013-12-24 DIAGNOSIS — I4891 Unspecified atrial fibrillation: Secondary | ICD-10-CM | POA: Insufficient documentation

## 2013-12-24 DIAGNOSIS — Z87891 Personal history of nicotine dependence: Secondary | ICD-10-CM | POA: Insufficient documentation

## 2013-12-24 DIAGNOSIS — I5023 Acute on chronic systolic (congestive) heart failure: Secondary | ICD-10-CM | POA: Diagnosis not present

## 2013-12-24 DIAGNOSIS — K209 Esophagitis, unspecified without bleeding: Secondary | ICD-10-CM

## 2013-12-24 DIAGNOSIS — N183 Chronic kidney disease, stage 3 unspecified: Secondary | ICD-10-CM | POA: Diagnosis not present

## 2013-12-24 DIAGNOSIS — R12 Heartburn: Secondary | ICD-10-CM | POA: Diagnosis not present

## 2013-12-24 DIAGNOSIS — K648 Other hemorrhoids: Secondary | ICD-10-CM | POA: Insufficient documentation

## 2013-12-24 DIAGNOSIS — I509 Heart failure, unspecified: Secondary | ICD-10-CM | POA: Diagnosis not present

## 2013-12-24 DIAGNOSIS — I2510 Atherosclerotic heart disease of native coronary artery without angina pectoris: Secondary | ICD-10-CM | POA: Diagnosis not present

## 2013-12-24 DIAGNOSIS — K6389 Other specified diseases of intestine: Secondary | ICD-10-CM | POA: Insufficient documentation

## 2013-12-24 DIAGNOSIS — R197 Diarrhea, unspecified: Secondary | ICD-10-CM

## 2013-12-24 DIAGNOSIS — I428 Other cardiomyopathies: Secondary | ICD-10-CM | POA: Insufficient documentation

## 2013-12-24 DIAGNOSIS — R109 Unspecified abdominal pain: Secondary | ICD-10-CM

## 2013-12-24 DIAGNOSIS — R1310 Dysphagia, unspecified: Secondary | ICD-10-CM | POA: Diagnosis present

## 2013-12-24 DIAGNOSIS — Z8673 Personal history of transient ischemic attack (TIA), and cerebral infarction without residual deficits: Secondary | ICD-10-CM | POA: Diagnosis not present

## 2013-12-24 DIAGNOSIS — G4730 Sleep apnea, unspecified: Secondary | ICD-10-CM | POA: Insufficient documentation

## 2013-12-24 DIAGNOSIS — IMO0001 Reserved for inherently not codable concepts without codable children: Secondary | ICD-10-CM | POA: Diagnosis not present

## 2013-12-24 DIAGNOSIS — J4489 Other specified chronic obstructive pulmonary disease: Secondary | ICD-10-CM | POA: Insufficient documentation

## 2013-12-24 DIAGNOSIS — K5730 Diverticulosis of large intestine without perforation or abscess without bleeding: Secondary | ICD-10-CM | POA: Diagnosis not present

## 2013-12-24 DIAGNOSIS — I129 Hypertensive chronic kidney disease with stage 1 through stage 4 chronic kidney disease, or unspecified chronic kidney disease: Secondary | ICD-10-CM | POA: Diagnosis not present

## 2013-12-24 HISTORY — PX: ESOPHAGOGASTRODUODENOSCOPY: SHX5428

## 2013-12-24 HISTORY — PX: COLONOSCOPY: SHX5424

## 2013-12-24 LAB — GLUCOSE, CAPILLARY
GLUCOSE-CAPILLARY: 102 mg/dL — AB (ref 70–99)
Glucose-Capillary: 58 mg/dL — ABNORMAL LOW (ref 70–99)

## 2013-12-24 SURGERY — EGD (ESOPHAGOGASTRODUODENOSCOPY)
Anesthesia: Monitor Anesthesia Care

## 2013-12-24 MED ORDER — PROPOFOL 10 MG/ML IV BOLUS
INTRAVENOUS | Status: AC
  Filled 2013-12-24: qty 20

## 2013-12-24 MED ORDER — LIDOCAINE HCL (CARDIAC) 20 MG/ML IV SOLN
INTRAVENOUS | Status: AC
  Filled 2013-12-24: qty 5

## 2013-12-24 MED ORDER — PANTOPRAZOLE SODIUM 40 MG PO TBEC: 40.0000 mg | DELAYED_RELEASE_TABLET | Freq: Two times a day (BID) | ORAL | Status: DC

## 2013-12-24 MED ORDER — SODIUM CHLORIDE 0.9 % IV SOLN
INTRAVENOUS | Status: DC
  Administered 2013-12-24: 12:00:00 via INTRAVENOUS

## 2013-12-24 MED ORDER — LIDOCAINE HCL (CARDIAC) 20 MG/ML IV SOLN
INTRAVENOUS | Status: DC | PRN
  Administered 2013-12-24: 75 mg via INTRAVENOUS

## 2013-12-24 MED ORDER — MIDAZOLAM HCL 2 MG/2ML IJ SOLN
INTRAMUSCULAR | Status: AC
  Filled 2013-12-24: qty 2

## 2013-12-24 MED ORDER — PROPOFOL INFUSION 10 MG/ML OPTIME
INTRAVENOUS | Status: DC | PRN
  Administered 2013-12-24: 120 ug/kg/min via INTRAVENOUS

## 2013-12-24 MED ORDER — DEXTROSE 50 % IV SOLN
1.0000 | Freq: Once | INTRAVENOUS | Status: AC
  Administered 2013-12-24: 12.5 g via INTRAVENOUS
  Filled 2013-12-24: qty 50

## 2013-12-24 MED ORDER — KETAMINE HCL 10 MG/ML IJ SOLN
INTRAMUSCULAR | Status: AC
  Filled 2013-12-24: qty 1

## 2013-12-24 MED ORDER — KETAMINE HCL 10 MG/ML IJ SOLN
INTRAMUSCULAR | Status: DC | PRN
  Administered 2013-12-24: 20 mg via INTRAVENOUS
  Administered 2013-12-24 (×2): 10 mg via INTRAVENOUS

## 2013-12-24 MED ORDER — MIDAZOLAM HCL 5 MG/5ML IJ SOLN
INTRAMUSCULAR | Status: DC | PRN
  Administered 2013-12-24 (×2): 1 mg via INTRAVENOUS

## 2013-12-24 NOTE — Transfer of Care (Signed)
Immediate Anesthesia Transfer of Care Note  Patient: Frank Davidson  Procedure(s) Performed: Procedure(s): ESOPHAGOGASTRODUODENOSCOPY (EGD) (N/A) COLONOSCOPY (N/A)  Patient Location: PACU and Endoscopy Unit  Anesthesia Type:MAC  Level of Consciousness: awake, oriented and patient cooperative  Airway & Oxygen Therapy: Patient Spontanous Breathing and Patient connected to nasal cannula oxygen  Post-op Assessment: Report given to PACU RN, Post -op Vital signs reviewed and stable and Patient moving all extremities X 4  Post vital signs: Reviewed and stable  Complications: No apparent anesthesia complications

## 2013-12-24 NOTE — Op Note (Signed)
[facility] [address] [place], [address]   COLONOSCOPY PROCEDURE REPORT  PATIENT: Frank Davidson, Frank Davidson.  MR#: [id number] BIRTHDATE: [birth year] , 62  yrs. old GENDER: Male ENDOSCOPIST: Louis Meckel, MD REFERRED OR:VIFBPPH Copland, M.D. PROCEDURE DATE:  12/24/2013 PROCEDURE:   Colonoscopy with biopsy ASA CLASS:   Class III INDICATIONS:Unexplained diarrhea. MEDICATIONS: MAC sedation, administered by CRNA  DESCRIPTION OF PROCEDURE:   After the risks benefits and alternatives of the procedure were thoroughly explained, informed consent was obtained.  A digital rectal exam revealed no abnormalities of the rectum.   The Pentax Colonoscope [id number] endoscope was introduced through the anus and advanced to the cecum, which was identified by both the appendix and ileocecal valve. No adverse events experienced.   The quality of the prep was excellent using Suprep  The instrument was then slowly withdrawn as the colon was fully examined.      COLON FINDINGS: Mild diverticulosis was noted in the sigmoid colon. Internal hemorrhoids were found.   The colon was otherwise normal. There was no diverticulosis, inflammation, polyps or cancers unless previously stated.  Random biopsies were taken to rule out microscopic colitisRetroflexed views revealed no abnormalities. The time to cecum=  .  Withdrawal time=7 minutes 30 seconds.  The scope was withdrawn and the procedure completed. COMPLICATIONS: There were no complications.  ENDOSCOPIC IMPRESSION: 1.  diverticulosis 2.  internal hemorrhoids  RECOMMENDATIONS: Await biopsy results Office visit 3-4 weeks   eSigned:  Louis Meckel, MD 12/24/2013 1:06 PM   cc:   PATIENT NAME:  Frank Davidson. MR#: [id number]

## 2013-12-24 NOTE — Discharge Instructions (Signed)
Barrett's Esophagus Barrett's esophagus occurs when the lining of the esophagus is damaged. The esophagus is the tube that carries food from the mouth to the stomach. With Barrett's esophagus, the lining of the esophagus gets replaced by material that is similar to the lining in the intestines. This process is called intestinal metaplasia. A small number of people with Barrett's esophagus develop esophageal cancer. CAUSES  The exact cause of Barrett's esophagus is unknown. SYMPTOMS  Most people with Barrett's esophagus do not have symptoms. However, many patients also have gastroesophageal reflux disease (GERD). GERD can cause heartburn, trouble swallowing, and a dry cough. DIAGNOSIS Barrett's esophagus is diagnosed by an exam called upper gastrointestinal endoscopy. A thin, flexible tube (endoscope) is passed down the esophagus. The endoscope has a light and camera on the end. Your caregiver uses the endoscope to view the inside of the esophagus. A tissue sample may also be taken and examined under a microscope (biopsy). If cancer cells are found during the biopsy, this condition is called dysplasia. TREATMENT  If you have no dysplasia or low-grade dysplasia, your caregiver may recommend no treatment or only taking medicines to treat GERD. Sometimes, taking acid-blocking drugs to treat GERD helps improve the tissue affected by Barrett's esophagus. Your caregiver may also recommend periodic esophageal exams. If you have high-grade dysplasia, treatment may include removing the damaged parts of the esophagus. This can be done by heating, freezing, or surgically removing the tissue. In some cases, surgery may be done to remove most of the esophagus. The stomach is then attached to the remaining portion of the esophagus. HOME CARE INSTRUCTIONS  Take acid-blocking drugs for GERD if recommended by your caregiver.  Keep all follow-up appointments as directed by your caregiver. You may need periodic  esophageal exams. SEEK IMMEDIATE MEDICAL CARE IF:  You have chest pain.  You have trouble swallowing.  You vomit blood or material that looks like coffee grounds.  Your stools are bright red or dark. Document Released: 01/29/2004 Document Revised: 05/09/2012 Document Reviewed: 01/18/2012 [facility] Patient Information 2014 [employer] [place].  Barrett's Esophagus Barrett's esophagus occurs when the lining of the esophagus is damaged. The esophagus is the tube that carries food from the mouth to the stomach. With Barrett's esophagus, the lining of the esophagus gets replaced by material that is similar to the lining in the intestines. This process is called intestinal metaplasia. A small number of people with Barrett's esophagus develop esophageal cancer. CAUSES  The exact cause of Barrett's esophagus is unknown. SYMPTOMS  Most people with Barrett's esophagus do not have symptoms. However, many patients also have gastroesophageal reflux disease (GERD). GERD can cause heartburn, trouble swallowing, and a dry cough. DIAGNOSIS Barrett's esophagus is diagnosed by an exam called upper gastrointestinal endoscopy. A thin, flexible tube (endoscope) is passed down the esophagus. The endoscope has a light and camera on the end. Your caregiver uses the endoscope to view the inside of the esophagus. A tissue sample may also be taken and examined under a microscope (biopsy). If cancer cells are found during the biopsy, this condition is called dysplasia. TREATMENT  If you have no dysplasia or low-grade dysplasia, your caregiver may recommend no treatment or only taking medicines to treat GERD. Sometimes, taking acid-blocking drugs to treat GERD helps improve the tissue affected by Barrett's esophagus. Your caregiver may also recommend periodic esophageal exams. If you have high-grade dysplasia, treatment may include removing the damaged parts of the esophagus. This can be done by heating, freezing, or surgically  removing the tissue. In some cases, surgery may be done to remove most of the esophagus. The stomach is then attached to the remaining portion of the esophagus. HOME CARE INSTRUCTIONS  Take acid-blocking drugs for GERD if recommended by your caregiver.  Keep all follow-up appointments as directed by your caregiver. You may need periodic esophageal exams. SEEK IMMEDIATE MEDICAL CARE IF:  You have chest pain.  You have trouble swallowing.  You vomit blood or material that looks like coffee grounds.  Your stools are bright red or dark. Document Released: 01/29/2004 Document Revised: 05/09/2012 Document Reviewed: 01/18/2012 [facility] Patient Information 2014 [place], [place]. Colonoscopia, cuidados aps (Colonoscopy, Care After) Consulte esta planilha nas prximas semanas. Essas instrues fornecem informaes para seu autocuidado aps o procedimento. Seu mdico tambm poder lhe fornecer instrues mais especficas. Seu tratamento foi planejado de acordo com as prticas mdicas atuais, mas s vezes ocorrem complicaes. Ligue para seu mdico se tiver [employer] ou dvidas aps o procedimento. O QUE ESPERAR APS O PROCEDIMENTO  Aps o procedimento,  normal que voc apresente:  Uma pequena quantidade de sangue nas fezes.  Quantidades moderadas de gs e clicas ou inchao abdominal moderado. INSTRUES PARA TRATAMENTO DOMICILIAR  No dirija, opere mquinas ou assine documentos importantes por 24 horas.  Voc pode tomar banho e retomar suas atividades fsicas regulares, mas mova-se em uma velocidade reduzida nas primeiras 24 horas.  Tenha perodos de descanso frequentes nas primeiras 24 horas.  Caminhe ou coloque uma compressa quente sobre o abdome para ajudar a Musician as Education officer, museum e o inchao abdominal.  Albesa Seen lquidos em quantidade suficiente para manter sua urina clara ou na cor amarelo plida.  Voc poder retomar sua dieta normal conforme instrudo por seu mdico. Evite  comidas pesadas e frituras de digesto difcil.  Evite beber lcool por 24 horas ou conforme instrudo por seu mdico.  Somente tome medicamentos de venda livre ou vendidos com prescrio mdica conforme orientado por seu mdico.  Caso uma amostra de tecido (bipsia) tenha sido removida durante o seu procedimento:  No tome aspirina ou anticoagulantes por 7 dias, ou conforme instrudo por seu mdico.  No beba lcool por 7 dias ou conforme instrudo por seu mdico.  Coma alimentos macios nas primeiras 24 horas. PROCURE UM MDICO SE: Apresentar pequenas quantidades de sangue nas fezes de maneira persistente 2 a 3 dias aps o procedimento. PROCURE UM MDICO IMEDIATAMENTE SE:  Notar mais que uma quantidade pequena de sangue nas fezes.  Eliminar grandes cogulos sanguneos nas fezes.  Seu abdome estiver inchado (distendido).  Tiver nuseas ou vmitos.  Tiver febre.  Tiver um [employer] da dor abdominal que no  aliviado com medicamentos. Document Released: 09/05/2009 Document Revised: 08/29/2013 [facility] Patient Information 2014 [place], [place].

## 2013-12-24 NOTE — Preoperative (Signed)
Beta Blockers   Reason not to administer Beta Blockers:Took Coreg this am. 

## 2013-12-24 NOTE — Op Note (Signed)
[facility] [address] [place], [address]   ENDOSCOPY PROCEDURE REPORT  PATIENT: Frank [facility], Frank Davidson.  MR#: [id number] BIRTHDATE: 12-06-1950 , 62  yrs. old GENDER: Male ENDOSCOPIST: Louis Meckel, MD REFERRED BY:  Abbe Amsterdam, M.D. PROCEDURE DATE:  12/24/2013 PROCEDURE:  EGD, diagnostic ASA CLASS:     Class III INDICATIONS:  Dysphagia. MEDICATIONS: There was residual sedation effect present from prior procedure and MAC sedation, administered by CRNA TOPICAL ANESTHETIC:  DESCRIPTION OF PROCEDURE: After the risks benefits and alternatives of the procedure were thoroughly explained, informed consent was obtained.  The PENTAX GASTOROSCOPE [id number] endoscope was introduced through the mouth and advanced to the third portion of the duodenum. Without limitations.  The instrument was slowly withdrawn as the mucosa was fully examined.      Grade B erosive esophagitis at the GE junction.   The remainder of the upper endoscopy exam was otherwise normal.  Retroflexed views revealed no abnormalities.     The scope was then withdrawn from the patient and the procedure completed.  COMPLICATIONS: There were no complications. ENDOSCOPIC IMPRESSION: 1.   Grade B erosive esophagitis at the GE junction 2.   The remainder of the upper endoscopy exam was otherwise normal  RECOMMENDATIONS: 1.  increase pantoprazole to 40 mg twice a day 2.  Office visit 3-4 weeks  REPEAT EXAM:  eSigned:  Louis Meckel, MD 12/24/2013 1:10 PM   CC:

## 2013-12-24 NOTE — Anesthesia Postprocedure Evaluation (Signed)
Anesthesia Post Note  Patient: Frank [facility]  Procedure(s) Performed: Procedure(s) (LRB): ESOPHAGOGASTRODUODENOSCOPY (EGD) (N/A) COLONOSCOPY (N/A)  Anesthesia type: MAC  Patient location: PACU  Post pain: Pain level controlled  Post assessment: Post-op Vital signs reviewed  Last Vitals:  Filed Vitals:   12/24/13 1313  BP: 109/56  Temp: 36.4 C  Resp: 16    Post vital signs: Reviewed  Level of consciousness: sedated  Complications: No apparent anesthesia complications

## 2013-12-24 NOTE — H&P (Signed)
_                                                                                                                History of Present Illness: 63 year old Afro-American male with atrial fibrillation, COPD, sleep apnea, diabetes, coronary artery disease, congestive heart failure, on Eliquis, referred for evaluation of dysphagia and diarrhea. The past month he's been complaining of dysphagia to solids. This was gradual onset. He denies odynophagia. He has occasional pyrosis. He's had diarrhea for several weeks. He was treated with Flagyl without improvement. He did receive a couple of courses of antibiotics including Rocephin and Bactrim. He complains of diffuse crampy abdominal discomfort. There is no history of melena or rectal bleeding. He's had no antecedent GI complaints. He has lost almost 30 pounds in the last 2 months.     Past Medical History  Diagnosis Date  . Fibromyalgia   . Atrial fibrillation   . Hypertension   . Osteoarthritis   . COPD (chronic obstructive pulmonary disease)   . Diabetes mellitus without complication   . Coronary artery disease   . Neuropathy   . Ocular herpes   . Gout   . CKD (chronic kidney disease), stage III   . CHF (congestive heart failure)     EF 15%  . Polysubstance abuse     Hx of  . Chronic pain   . Hyperlipidemia   . Nonischemic cardiomyopathy   . Ocular herpes   . Neuropathy   . Left-sided weakness   . Acute on chronic systolic heart failure   . Stroke     x 5. last stroke aug 2014  . Sleep apnea with use of continuous positive airway pressure (CPAP)     does not use cpap   Past Surgical History  Procedure Laterality Date  . 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 Cardiac catheterization  10/2006    normal coronary arteries  . Tonsillectomy  age 63   family history includes Cancer in his mother; Diabetes in his father and sister; Hypertension in his brother, mother, sister, and sister. There is no history of Colon  cancer. Current Facility-Administered Medications  Medication Dose Route Frequency Provider Last Rate Last Dose  . 0.9 %  sodium chloride infusion   Intravenous Continuous Louis Meckelobert D Clarion Mooneyhan, MD       Allergies as of 11/07/2013 - Review Complete 11/05/2013  Allergen Reaction Noted  . Actos [pioglitazone] Other (See Comments) 07/03/2013    reports that he quit smoking about 7 years ago. His smoking use included Cigarettes. He has a 5 pack-year smoking history. He has never used smokeless tobacco. He reports that he uses illicit drugs (Cocaine and Marijuana). He reports that he does not drink alcohol.     Review of Systems: Pertinent positive and negative review of systems were noted in the above HPI section. All other review of systems were otherwise negative.  Vital  signs were reviewed in today's medical record Physical Exam: General: Well developed , well nourished, no acute distress Skin: anicteric Head: Normocephalic and atraumatic Eyes:  sclerae anicteric, EOMI Ears: Normal auditory acuity Mouth: No deformity or lesions Neck: Supple, no masses or thyromegaly Lungs: Clear throughout to auscultation Heart: Regular rate and rhythm; no murmurs, rubs or bruits Abdomen: Soft, non tender and non distended. No masses, hepatosplenomegaly or hernias noted. Normal Bowel sounds Rectal:deferred Musculoskeletal: Symmetrical with no gross deformities  Skin: No lesions on visible extremities Pulses:  Normal pulses noted Extremities: No clubbing, cyanosis, edema or deformities noted Neurological: Alert oriented x 4, grossly nonfocal Cervical Nodes:  No significant cervical adenopathy Inguinal Nodes: No significant inguinal adenopathy Psychological:  Alert and cooperative. Normal mood and affect  Impression #1 diarrhea #2 dysphagia  Plan -  endoscopy and colonoscopy.  Note patient remains on Eliquis

## 2013-12-24 NOTE — Anesthesia Preprocedure Evaluation (Addendum)
Anesthesia Evaluation  Patient identified by MRN, date of birth, ID band Patient awake    Reviewed: Allergy & Precautions, H&P , NPO status , Patient's Chart, lab work & pertinent test results  Airway Mallampati: III TM Distance: >3 FB Neck ROM: Full    Dental  (+) Edentulous Upper, Missing and Dental Advisory Given   Pulmonary neg pulmonary ROS, sleep apnea (Noncompliant with CPAP) , COPDformer smoker,  breath sounds clear to auscultation  Pulmonary exam normal       Cardiovascular hypertension, Pt. on medications and Pt. on home beta blockers + CAD (Patient denies CAD) and +CHF + dysrhythmias Atrial Fibrillation Rhythm:Regular Rate:Normal     Neuro/Psych  Neuromuscular disease CVA negative neurological ROS  negative psych ROS   GI/Hepatic Neg liver ROS, (+)     substance abuse (Hx of polysubstance abuse)   ,   Endo/Other  diabetes, Type 2, Oral Hypoglycemic Agents  Renal/GU Renal disease  negative genitourinary   Musculoskeletal  (+) Fibromyalgia -  Abdominal   Peds negative pediatric ROS (+)  Hematology negative hematology ROS (+)   Anesthesia Other Findings   Reproductive/Obstetrics                          Anesthesia Physical Anesthesia Plan  ASA: III  Anesthesia Plan: MAC   Post-op Pain Management:    Induction: Intravenous  Airway Management Planned: Simple Face Mask  Additional Equipment:   Intra-op Plan:   Post-operative Plan:   Informed Consent: I have reviewed the patients History and Physical, chart, labs and discussed the procedure including the risks, benefits and alternatives for the proposed anesthesia with the patient or authorized representative who has indicated his/her understanding and acceptance.   Dental advisory given  Plan Discussed with: CRNA  Anesthesia Plan Comments:         Anesthesia Quick Evaluation

## 2013-12-25 ENCOUNTER — Encounter (HOSPITAL_COMMUNITY): Payer: Self-pay | Admitting: Gastroenterology

## 2013-12-26 ENCOUNTER — Encounter: Payer: Self-pay | Admitting: Gastroenterology

## 2013-12-27 ENCOUNTER — Telehealth: Payer: Self-pay | Admitting: Family

## 2013-12-27 NOTE — Telephone Encounter (Signed)
Pt was suppose to go to court for traffic violation and was unable to go due to being hospitalized. Pt is unable to go to driving classes due to rehabilitating. Please fax note to pt attorney  algernon williams [phone].

## 2013-12-28 ENCOUNTER — Telehealth: Payer: Self-pay | Admitting: Family

## 2013-12-28 NOTE — Telephone Encounter (Signed)
Pt has ordered diabetic shoes from [employer]. In order for medicare to pay for the shoes , the collaborating doctor must sign. However . The NP can sign in conjunction w/ the MD.  Order will be faxed on Monday.

## 2013-12-28 NOTE — Telephone Encounter (Signed)
Letter printed. Call patient and advise. Please fax as requested.

## 2013-12-28 NOTE — Telephone Encounter (Signed)
Letter faxed.

## 2013-12-31 ENCOUNTER — Ambulatory Visit: Payer: Medicare Other | Admitting: Physical Medicine & Rehabilitation

## 2013-12-31 ENCOUNTER — Encounter: Payer: Self-pay | Admitting: Gastroenterology

## 2014-01-02 ENCOUNTER — Encounter (HOSPITAL_COMMUNITY): Payer: Self-pay

## 2014-01-02 ENCOUNTER — Telehealth (HOSPITAL_COMMUNITY): Payer: Self-pay

## 2014-01-02 ENCOUNTER — Encounter: Payer: Self-pay | Admitting: Internal Medicine

## 2014-01-02 ENCOUNTER — Ambulatory Visit (HOSPITAL_COMMUNITY)
Admission: RE | Admit: 2014-01-02 | Discharge: 2014-01-02 | Disposition: A | Discharge status: Home / Self Care | Payer: Medicare HMO | Source: Ambulatory Visit | Attending: Internal Medicine | Admitting: Internal Medicine

## 2014-01-02 VITALS — BP 124/84 | HR 66 | Resp 18 | Wt 221.8 lb

## 2014-01-02 DIAGNOSIS — M109 Gout, unspecified: Secondary | ICD-10-CM | POA: Insufficient documentation

## 2014-01-02 DIAGNOSIS — N189 Chronic kidney disease, unspecified: Secondary | ICD-10-CM | POA: Insufficient documentation

## 2014-01-02 DIAGNOSIS — I5022 Chronic systolic (congestive) heart failure: Secondary | ICD-10-CM | POA: Insufficient documentation

## 2014-01-02 DIAGNOSIS — E669 Obesity, unspecified: Secondary | ICD-10-CM | POA: Insufficient documentation

## 2014-01-02 DIAGNOSIS — I509 Heart failure, unspecified: Secondary | ICD-10-CM | POA: Insufficient documentation

## 2014-01-02 DIAGNOSIS — I10 Essential (primary) hypertension: Secondary | ICD-10-CM

## 2014-01-02 DIAGNOSIS — N183 Chronic kidney disease, stage 3 unspecified: Secondary | ICD-10-CM

## 2014-01-02 DIAGNOSIS — I129 Hypertensive chronic kidney disease with stage 1 through stage 4 chronic kidney disease, or unspecified chronic kidney disease: Secondary | ICD-10-CM | POA: Insufficient documentation

## 2014-01-02 DIAGNOSIS — I4891 Unspecified atrial fibrillation: Secondary | ICD-10-CM | POA: Insufficient documentation

## 2014-01-02 MED ORDER — SPIRONOLACTONE 25 MG PO TABS: 12.5000 mg | ORAL_TABLET | Freq: Every day | ORAL | Status: DC

## 2014-01-02 NOTE — Telephone Encounter (Signed)
Patient asked during office visit today about starting cardiac rehab, called to follow up on referral made for [name], Frank Davidson in cardiac rehab outpatient ensured that referral will be followed up on and patient will be notified of next steps to begin rehab (was still recooperating and unable to start at previous attempt to set up). [address] [facility]

## 2014-01-02 NOTE — Patient Instructions (Signed)
Start spironolactone 12.5 mg (1/2 tablet) daily.  Come back next Thursday in the am to get labs rechecked.  Pick up some Coricidin for your cold, if it gets worse call your Primary Care Doctor.  Doing great.  F/U 6 weeks.  Do the following things EVERYDAY: 1) Weigh yourself in the morning before breakfast. Write it down and keep it in a log. 2) Take your medicines as prescribed 3) Eat low salt foods-Limit salt (sodium) to 2000 mg per day.  4) Stay as active as you can everyday 5) Limit all fluids for the day to less than 2 liters

## 2014-01-02 NOTE — Progress Notes (Signed)
Patient ID: Frank Davidson, male   DOB: [birth year], 63 y.o.   MRN: [id number]   Weight Range  229 pounds   Baseline proBNP    PCP: [facility] GI: [facility] Rheumatologist:   HPI: Frank Davidson is a 63 yo male with a hx of obesity, PAF, HTN, COPD, HTN, gout, DM 2, CVA and CHF due to NICM with EF 20-25% (06/2013).   Has previously followed in the [facility] in [place]. Previous cath in 2007 showed NICM with normal cors. Was living alone in [place] and apparently was issues managing medications by himself and was getting confused with PCP and cardiologists medications and was not taking his meds correctly. So he moved to [facility] [place] near his nieces. Has not been on ACE-I due to renal failure. Highest creatinine in our system is ~1.5 but was apparently higher previously - thought to be related to taking his medications incorrectly.   Was admitted in 06/2013 from [facility] with left sided weakness and was found to have a moderate to large right hemispheric infarct. Went back to [facility].   Admitted to [facility] 10/14 through 09/09/13 with ADHF. Diuresed 30 pounds with IV lasix and transitioned to lasix 80 mg po bid. Continued on 25 mg carvedilol bid, isordil 20 mg daily.  Started on lisinopril 2.5 mg daily. Discharge weight 229 pounds.    Follow up: Doing well. Having arthritis pain in hips, thighs and knees. Is set up for pain management [facility] appt tomorrow. Denies SOB, orthopnea, CP, or edema. Taking medications as prescribed. Has needed to take an extra lasix every 3-4 days. Weight at home 215-218 lbs. No BRPBP or melena. Following low salt diet and drinking less than 2L a day. Walks with walker on a good day walks about a block.   ECHO 11/08/13 EF 15%   Labs 09/07/13 K 3.9 Creatinine 1.18  Labs 09/17/13 dig level 1.7 K 4.4 Creatinine 1.41 Pro BNP 2256 Labs 09/25/13 K 6.1 Creatinine 2.57  Labs 09/28/13  K 5.3 Creatinine 1.98  Labs 10/01/13 Dig level 2.5 --->dig stopped Labs  10/05/13 K 4.8 Creatinine 1.79 Labs 11/01/13 K 3.3 --Supplemented Creatinine 1.3 Uric Acid 9.3 AST 23 ALT 12 Hemoglobin 13 Hemoglobin 7.2  Labs 11/21/13 K 4.4 Creatinine 1.3   09/29/13 CT abdomen and pelvis - no obstruction . Diverticulosis. No inflammatory changes.   SH: Retired Education officer, [employer] 2011 Lives with his niece  FH: Father DM  Mom had lung cancer  ROS: All systems negative except as listed in HPI, PMH and Problem List.  Past Medical History  Diagnosis Date  . Fibromyalgia   . Atrial fibrillation   . Hypertension   . Osteoarthritis   . COPD (chronic obstructive pulmonary disease)   . Diabetes mellitus without complication   . Coronary artery disease   . Neuropathy   . Ocular herpes   . Gout   . CKD (chronic kidney disease), stage III   . CHF (congestive heart failure)     EF 15%  . Polysubstance abuse     Hx of  . Chronic pain   . Hyperlipidemia   . Nonischemic cardiomyopathy   . Ocular herpes   . Neuropathy   . Left-sided weakness   . Acute on chronic systolic heart failure   . Stroke     x 5. last stroke aug 2014  . Sleep apnea with use of continuous positive airway pressure (CPAP)     does not use cpap    Current  Outpatient Prescriptions  Medication Sig Dispense Refill  . allopurinol (ZYLOPRIM) 100 MG tablet Take 100 mg by mouth daily with breakfast.      . apixaban (ELIQUIS) 5 MG TABS tablet Take 5 mg by mouth 2 (two) times daily.      . calcium carbonate (TUMS - DOSED IN MG ELEMENTAL CALCIUM) 500 MG chewable tablet Chew 1 tablet by mouth 3 (three) times daily.      . carvedilol (COREG) 25 MG tablet Take 25 mg by mouth 2 (two) times daily with a meal.      . furosemide (LASIX) 80 MG tablet Take 80 mg by mouth daily.      Marland Kitchen glipiZIDE (GLUCOTROL) 5 MG tablet Take 5 mg by mouth 2 (two) times daily before a meal.      . hydrALAZINE (APRESOLINE) 25 MG tablet Take 25 mg by mouth daily.       . isosorbide mononitrate (IMDUR) 30 MG 24 hr tablet Take 30 mg by  mouth daily.      Marland Kitchen lisinopril (PRINIVIL,ZESTRIL) 2.5 MG tablet Take 2.5 mg by mouth 2 (two) times daily.      Marland Kitchen oxyCODONE-acetaminophen (PERCOCET/ROXICET) 5-325 MG per tablet Take 1 tablet by mouth every 8 (eight) hours as needed for severe pain.  90 tablet  0  . pantoprazole (PROTONIX) 40 MG tablet Take 1 tablet (40 mg total) by mouth 2 (two) times daily.  60 tablet  2  . potassium chloride SA (K-DUR,KLOR-CON) 20 MEQ tablet Take 20 mEq by mouth daily.       No current facility-administered medications for this encounter.    Filed Vitals:   01/02/14 1126  BP: 124/84  Pulse: 66  Resp: 18  Weight: 221 lb 12 oz (100.585 kg)  SpO2: 100%   PHYSICAL EXAM: General:  Obese male;  Ambulated into [facility] with a walker. niece present  HEENT: normal Neck: supple. JVP 5-6.  Carotids 2+ bilaterally; no bruits. No lymphadenopathy or thryomegaly appreciated. Cor: PMI normal. Irregular  rate & rhythm. No rubs, gallops or murmurs. Lungs: clear Abdomen: obese soft, nontender, nondistended. No hepatosplenomegaly. No bruits or masses. Good bowel sounds. Extremities: no cyanosis, clubbing, rash, no edema  Neuro: alert & orientedx3, cranial nerves grossly intact. Affect pleasant.  ASSESSMENT & PLAN:  1) Chronic Systolic Heart Failure: NICM, EF 15-20% (10/2013)  - NYHA II symptoms and volume status stable. Will continue lasix 80 mg daily and instructed to take an additional lasix if weight >219 lbs.  -Coreg at goal dose 25 mg BID. - Not on digoxin d/t elevated level 2.5 (09/2013) -Continue lisinopril 2.5 mg BID, hydralazine 25 mg TID and IMDUR 30 mg daily. - Will start spironolactone 12.5 mg daily. Check BMET today with [facility] and then in 7-10 days.   - Not currently interested in ICD, met with Frank. Lovena Le. - Will refer to Cardiac Rehab -Reinforced daily weights, low salt food choices, and limiting fluid intake to < 2 liters per day.  2) Gout - Continue allopurinol.  Referral  to rheumatology pending  and scheduled for 12/03/13 .  3) Atrial fibrillation-  Rate controlled, continue coreg BID. on eliquis 5 mg bid. No s/s of bleeding. 4) Chronic renal failure, stage III- Check BMET today.   F/U 6 weeks Junie Bame B NP-C 11:32 AM

## 2014-01-03 ENCOUNTER — Encounter (INDEPENDENT_AMBULATORY_CARE_PROVIDER_SITE_OTHER): Payer: Self-pay

## 2014-01-03 ENCOUNTER — Encounter: Payer: Medicare HMO | Attending: Physical Medicine & Rehabilitation

## 2014-01-03 ENCOUNTER — Ambulatory Visit (HOSPITAL_BASED_OUTPATIENT_CLINIC_OR_DEPARTMENT_OTHER): Payer: Medicare HMO | Admitting: Physical Medicine & Rehabilitation

## 2014-01-03 ENCOUNTER — Encounter: Payer: Self-pay | Admitting: Physical Medicine & Rehabilitation

## 2014-01-03 VITALS — BP 119/66 | HR 77 | Resp 14 | Ht 70.0 in | Wt 222.0 lb

## 2014-01-03 DIAGNOSIS — J4489 Other specified chronic obstructive pulmonary disease: Secondary | ICD-10-CM | POA: Insufficient documentation

## 2014-01-03 DIAGNOSIS — G8929 Other chronic pain: Secondary | ICD-10-CM

## 2014-01-03 DIAGNOSIS — M25559 Pain in unspecified hip: Secondary | ICD-10-CM

## 2014-01-03 DIAGNOSIS — I69993 Ataxia following unspecified cerebrovascular disease: Secondary | ICD-10-CM

## 2014-01-03 DIAGNOSIS — J449 Chronic obstructive pulmonary disease, unspecified: Secondary | ICD-10-CM | POA: Insufficient documentation

## 2014-01-03 DIAGNOSIS — I6330 Cerebral infarction due to thrombosis of unspecified cerebral artery: Secondary | ICD-10-CM

## 2014-01-03 DIAGNOSIS — I509 Heart failure, unspecified: Secondary | ICD-10-CM | POA: Insufficient documentation

## 2014-01-03 DIAGNOSIS — M24559 Contracture, unspecified hip: Secondary | ICD-10-CM | POA: Insufficient documentation

## 2014-01-03 DIAGNOSIS — Z79899 Other long term (current) drug therapy: Secondary | ICD-10-CM

## 2014-01-03 DIAGNOSIS — N189 Chronic kidney disease, unspecified: Secondary | ICD-10-CM | POA: Insufficient documentation

## 2014-01-03 DIAGNOSIS — Z5181 Encounter for therapeutic drug level monitoring: Secondary | ICD-10-CM

## 2014-01-03 DIAGNOSIS — M1990 Unspecified osteoarthritis, unspecified site: Secondary | ICD-10-CM

## 2014-01-03 MED ORDER — TRAMADOL HCL 50 MG PO TABS: 50.0000 mg | ORAL_TABLET | Freq: Four times a day (QID) | ORAL | Status: DC

## 2014-01-03 NOTE — Patient Instructions (Signed)
Made referral to [facility] rehabilitation. Since you had a stroke they could also work with balance related issues.  In addition ordered x-rays of the hips across the street at [facility]  In addition ordered tramadol for pain  If the tramadol is not helpful in the like to get back on oxycodone, would recommend an appointment at preferred pain management  Preferred pain management [address] [place] [address] [phone]

## 2014-01-03 NOTE — Progress Notes (Signed)
Subjective:    Patient ID: Frank Davidson, male    DOB: April 25, 1951, 63 y.o.   MRN: [id number]  HPI Chief complaint is bilateral hip pain Onset about 5 years ago. Was living in [place] at that time. Saw pain management Dr. as well as orthopedics. Orthopedics did not recommend any surgery since the findings were too early. Was treated with oxycodone as well as hip injections. Patient reports that at first hip injections were helpful but then became less helpful over time. Went to physical therapy clinic as well.  Went to chiropractic clinic as well.  Patient currently on disability for combination of heart problems as well as musculoskeletal issues. Goals are to return to work at some point.  Recent medical history includes hospitalizations for congestive heart failure in October. Was also hospitalized for stroke in August.  Stroke occurred while at skilled nursing facility. Patient was a skilled nursing facility after a prolonged hospitalization for congestive heart failure. A hospitalization was at [facility] in [place].  Also has problems with intermittent gout flareups in the right elbow as well as bilateral feet. He is on allopurinol for gout prophylaxis.  Past history significant for hypertension, congestive heart failure, diabetes. Pain Inventory Average Pain 7 Pain Right Now 7 My pain is constant and aching  In the last 24 hours, has pain interfered with the following? General activity 5 Relation with others 5 Enjoyment of life 6 What TIME of day is your pain at its worst? varies Sleep (in general) Fair  Pain is worse with: walking Pain improves with: therapy/exercise Relief from Meds: 7  Mobility walk without assistance walk with assistance use a cane use a walker how many minutes can you walk? 15-20 ability to climb steps?  yes do you drive?  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  yes x-rays CT/MRI  Physicians involved in your care Any changes since last visit?  no   Family History  Problem Relation Age of Onset  . Cancer Mother   . Hypertension Mother   . Diabetes Father   . Diabetes Sister   . Hypertension Sister   . Hypertension Brother   . Hypertension Sister   . Colon cancer Neg Hx    History   Social History  . Marital Status: Divorced    Spouse Name: N/A    Number of Children: N/A  . Years of Education: N/A   Social History Main Topics  . Smoking status: Former Smoker -- 0.50 packs/day for 10 years    Types: Cigarettes    Quit date: 12/18/2006  . Smokeless tobacco: Never Used  . Alcohol Use: No  . Drug Use: Yes    Special: Cocaine, Marijuana     Comment: last used marijuana and coaine 2008  . Sexual Activity: None   Other Topics Concern  . None   Social History Narrative  . None   Past Surgical History  Procedure Laterality Date  . 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 Cardiac catheterization  10/2006    normal coronary arteries  . Tonsillectomy  age 53  . Esophagogastroduodenoscopy N/A 12/24/2013    Procedure: ESOPHAGOGASTRODUODENOSCOPY (EGD);  Surgeon: Louis Meckel, MD;  Location: [facility];  Service: Endoscopy;  Laterality: N/A;  . Colonoscopy N/A 12/24/2013    Procedure: COLONOSCOPY;  Surgeon: Molly Maduro  Rosalio Macadamia, MD;  Location: [facility];  Service: Endoscopy;  Laterality: N/A;   Past Medical History  Diagnosis Date  . Fibromyalgia   . Atrial fibrillation   . Hypertension   . Osteoarthritis   . COPD (chronic obstructive pulmonary disease)   . Diabetes mellitus without complication   . Coronary artery disease   . Neuropathy   . Ocular herpes   . Gout   . CKD (chronic kidney disease), stage III   . CHF (congestive heart failure)     EF 15%  . Polysubstance abuse     Hx of  . Chronic pain   .  Hyperlipidemia   . Nonischemic cardiomyopathy   . Ocular herpes   . Neuropathy   . Left-sided weakness   . Acute on chronic systolic heart failure   . Stroke     x 5. last stroke aug 2014  . Sleep apnea with use of continuous positive airway pressure (CPAP)     does not use cpap   BP 119/66  Pulse 77  Resp 14  Ht 5\' 10"  (1.778 m)  Wt 222 lb (100.699 kg)  BMI 31.85 kg/m2  SpO2 96%  Opioid Risk Score: 17 Fall Risk Score: Moderate Fall Risk (6-13 points) (pt educated on fall risk, brochure given to pt.)   Review of Systems  Genitourinary:       Bladder control problems  Musculoskeletal: Positive for gait problem.  Neurological: Positive for weakness.  All other systems reviewed and are negative.       Objective:   Physical Exam  Nursing note and vitals reviewed. Constitutional: He is oriented to person, place, and time. He appears well-developed and well-nourished.  HENT:  Head: Normocephalic and atraumatic.  Right Ear: External ear normal.  Left Ear: External ear normal.  Mouth/Throat: Oropharynx is clear and moist.  Eyes: Conjunctivae and EOM are normal. Pupils are equal, round, and reactive to light.  Neck: Normal range of motion.  Musculoskeletal:       Right hip: He exhibits decreased range of motion. He exhibits no tenderness and no deformity.       Left hip: He exhibits decreased range of motion. He exhibits no tenderness and no deformity.       Right knee: Normal.       Left knee: Normal.       Lumbar back: He exhibits decreased range of motion. He exhibits no tenderness, no deformity and no spasm.  -SLR  Neurological: He is alert and oriented to person, place, and time. He has normal strength and normal reflexes. No sensory deficit.  Rises slowly from chair uses walker to ambulate, no toe drag  Visual fields appear intact to confrontation  Reduced standing balance with increased base of support  Psychiatric: He has a normal mood and affect.           Assessment & Plan:  1.  Chronic bilateral hip pain with contracture in the setting of multiple medical issues (COPD,CHF,CKD)  as well as hx of substance abuse.  Elevated opiate risk tool score of 17 (any score>8 is high risk for misuse).  Would not feel comfortable prescribing C2 or 3 narcotic analgesics in this case. Would like to get hip xrays to further evaluate.  Consider hip fluoro guided injections.  Will start tramadol 50mg  QID as trial.  If pt does feel benefit, can pursue pain management practice with an MD licensed in Addiction Medicine -[facility] [address] [place]  [phone]  2.  Hx of subacute CVA, R insular cortex, had a L HP which has resolved but trunkal ataxia remains despite finishing SNF, and HH PT/OT.  Will refer to outpt neuro rehab

## 2014-01-09 ENCOUNTER — Telehealth: Payer: Self-pay | Admitting: Family

## 2014-01-09 ENCOUNTER — Telehealth: Payer: Self-pay

## 2014-01-09 NOTE — Telephone Encounter (Signed)
Please place referral at requested location

## 2014-01-09 NOTE — Telephone Encounter (Signed)
Pt following up again on order for the diabetic shoes. Merlyn Albert sent letter to pt b/c she had not received order.  Love's no:  [phone] pls advise

## 2014-01-09 NOTE — Telephone Encounter (Signed)
Patient called and stated he had contacted the pain management practice on [facility] as directed if Tramadol was not working for him. They said patient needed a referral from PCP. Patient's PCP said that [facility] had to refer the patient. Should we do the referral or patient's PCP?

## 2014-01-09 NOTE — Telephone Encounter (Signed)
Advised Frank Davidson that requested information was received from Frank Davidson and faxed back to her on 01/07/14

## 2014-01-09 NOTE — Telephone Encounter (Signed)
Referral faxed to [facility].

## 2014-01-09 NOTE — Telephone Encounter (Signed)
Pt states he is needing a referral for pain management, [address] [phone], pt state pain management informed him that the referral has to come from his primary doctor.

## 2014-01-09 NOTE — Telephone Encounter (Signed)
I have done this. Please check on status.

## 2014-01-10 ENCOUNTER — Encounter: Payer: Self-pay | Admitting: Internal Medicine

## 2014-01-10 NOTE — Telephone Encounter (Signed)
I'm not sure what is needed from an insurance standpoint. We can certainly make the referral since he is not being discharged for violation of control substance agreement

## 2014-01-10 NOTE — Telephone Encounter (Signed)
Referral to Dr Rayna Sexton [facility] [address] [place] [address] [phone]  Chronic hip pain secondary to hip osteoarthritis

## 2014-01-10 NOTE — Telephone Encounter (Signed)
Please place referral

## 2014-01-11 NOTE — Telephone Encounter (Signed)
I have called and lvm to see how to go about getting referral to them..left my name number, extension, and reason of the call... 920 am [phone]

## 2014-01-15 ENCOUNTER — Telehealth: Payer: Self-pay | Admitting: Family

## 2014-01-15 NOTE — Telephone Encounter (Signed)
[facility] stated they need referral from [employer].  Must include UDS screenings and confirmation.  UDS must be done at every visit, so as many as you can get approved.  Fax [phone] Received referral from [place], but need the humana  Referral Pt's appt is march 12.

## 2014-01-18 ENCOUNTER — Other Ambulatory Visit: Payer: Self-pay | Admitting: Family

## 2014-01-25 ENCOUNTER — Ambulatory Visit (INDEPENDENT_AMBULATORY_CARE_PROVIDER_SITE_OTHER): Payer: Medicare HMO | Admitting: Family

## 2014-01-25 ENCOUNTER — Encounter: Payer: Self-pay | Admitting: Family

## 2014-01-25 VITALS — BP 100/62 | HR 68 | Wt 218.0 lb

## 2014-01-25 DIAGNOSIS — M25559 Pain in unspecified hip: Secondary | ICD-10-CM

## 2014-01-25 DIAGNOSIS — G8929 Other chronic pain: Secondary | ICD-10-CM

## 2014-01-25 DIAGNOSIS — M76899 Other specified enthesopathies of unspecified lower limb, excluding foot: Secondary | ICD-10-CM

## 2014-01-25 DIAGNOSIS — I10 Essential (primary) hypertension: Secondary | ICD-10-CM

## 2014-01-25 DIAGNOSIS — M7070 Other bursitis of hip, unspecified hip: Secondary | ICD-10-CM

## 2014-01-25 DIAGNOSIS — E119 Type 2 diabetes mellitus without complications: Secondary | ICD-10-CM

## 2014-01-25 MED ORDER — PREDNISONE 20 MG PO TABS: 20.0000 mg | ORAL_TABLET | Freq: Two times a day (BID) | ORAL | Status: DC

## 2014-01-25 NOTE — Patient Instructions (Signed)

## 2014-01-25 NOTE — Progress Notes (Signed)
Subjective:    Patient ID: Frank Davidson, male    DOB: 1950/12/08, 63 y.o.   MRN: [id number]  HPI 63 year old African American male, nonsmoker, with a history of COPD, type 2 diabetes, chronic kidney disease, polysubstance abuse, chronic hip pain bilaterally, and osteoarthritis is in today requesting pain medication. He was seen by the pain clinic who suggested he had a high risk for menses and with denied schedule 2 or schedule 3 medication. He is awaiting an appointment to Dr. Jordan Likes on 02/07/2014 specializes in addiction medicine. He was also told that x-rays of his hips bilaterally but he has not done. He is requesting medication in the interim. Last hemoglobin A1c 7.3. His requesting a referral to an ophthalmologist and podiatrist.    Review of Systems  Constitutional: Negative.   Respiratory: Negative.   Cardiovascular: Negative.   Gastrointestinal: Negative.   Endocrine: Negative.   Genitourinary: Negative.   Musculoskeletal: Positive for arthralgias.       Chronic bilateral hip and knee pain  Skin: Negative.   Allergic/Immunologic: Negative.   Neurological: Negative.   Psychiatric/Behavioral: Negative.    Past Medical History  Diagnosis Date  . Fibromyalgia   . Atrial fibrillation   . Hypertension   . Osteoarthritis   . COPD (chronic obstructive pulmonary disease)   . Diabetes mellitus without complication   . Coronary artery disease   . Neuropathy   . Ocular herpes   . Gout   . CKD (chronic kidney disease), stage III   . CHF (congestive heart failure)     EF 15%  . Polysubstance abuse     Hx of  . Chronic pain   . Hyperlipidemia   . Nonischemic cardiomyopathy   . Ocular herpes   . Neuropathy   . Left-sided weakness   . Acute on chronic systolic heart failure   . Stroke     x 5. last stroke aug 2014  . Sleep apnea with use of continuous positive airway pressure (CPAP)     does not use cpap    History   Social History  . Marital Status: Divorced   Spouse Name: N/A    Number of Children: N/A  . Years of Education: N/A   Occupational History  . Not on file.   Social History Main Topics  . Smoking status: Former Smoker -- 0.50 packs/day for 10 years    Types: Cigarettes    Quit date: 12/18/2006  . Smokeless tobacco: Never Used  . Alcohol Use: No  . Drug Use: Yes    Special: Cocaine, Marijuana     Comment: last used marijuana and coaine 2008  . Sexual Activity: Not on file   Other Topics Concern  . Not on file   Social History Narrative  . No narrative on file    Past Surgical History  Procedure Laterality Date  . 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 Cardiac catheterization  10/2006    normal coronary arteries  . Tonsillectomy  age 43  . Esophagogastroduodenoscopy N/A 12/24/2013    Procedure: ESOPHAGOGASTRODUODENOSCOPY (EGD);  Surgeon: Louis Meckel, MD;  Location: [facility];  Service: Endoscopy;  Laterality: N/A;  . Colonoscopy N/A 12/24/2013    Procedure: COLONOSCOPY;  Surgeon: Louis Meckel, MD;  Location: [facility];  Service: Endoscopy;  Laterality: N/A;    Family History  Problem Relation Age of Onset  . Cancer Mother   . Hypertension Mother   . Diabetes Father   .  Diabetes Sister   . Hypertension Sister   . Hypertension Brother   . Hypertension Sister   . Colon cancer Neg Hx     Allergies  Allergen Reactions  . Actos [Pioglitazone] Other (See Comments)    Caused blood in urine and fluid retention     Current Outpatient Prescriptions on File Prior to Visit  Medication Sig Dispense Refill  . allopurinol (ZYLOPRIM) 100 MG tablet Take 100 mg by mouth daily with breakfast.      . apixaban (ELIQUIS) 5 MG TABS tablet Take 5 mg by mouth 2 (two) times daily.      . calcium carbonate (TUMS - DOSED IN MG ELEMENTAL CALCIUM) 500 MG chewable tablet Chew 1 tablet by mouth 3 (three) times daily.      . carvedilol (COREG) 25 MG tablet Take 25 mg by mouth 2 (two) times daily with a meal.      . furosemide  (LASIX) 80 MG tablet Take 80 mg by mouth daily.      Marland Kitchen glipiZIDE (GLUCOTROL) 5 MG tablet Take 5 mg by mouth 2 (two) times daily before a meal.      . hydrALAZINE (APRESOLINE) 25 MG tablet Take 25 mg by mouth daily.       . isosorbide mononitrate (IMDUR) 30 MG 24 hr tablet Take 30 mg by mouth daily.      Marland Kitchen KLOR-CON M20 20 MEQ tablet TAKE 1 TABLET (20 MEQ TOTAL) BY MOUTH DAILY.  90 tablet  0  . lisinopril (PRINIVIL,ZESTRIL) 2.5 MG tablet Take 2.5 mg by mouth 2 (two) times daily.      . pantoprazole (PROTONIX) 40 MG tablet Take 1 tablet (40 mg total) by mouth 2 (two) times daily.  60 tablet  2  . potassium chloride SA (K-DUR,KLOR-CON) 20 MEQ tablet Take 20 mEq by mouth daily.      Marland Kitchen spironolactone (ALDACTONE) 25 MG tablet Take 0.5 tablets (12.5 mg total) by mouth daily.  15 tablet  3  . traMADol (ULTRAM) 50 MG tablet Take 1 tablet (50 mg total) by mouth 4 (four) times daily.  120 tablet  1   No current facility-administered medications on file prior to visit.    BP 100/62  Pulse 68  Wt 218 lb (98.884 kg)chart    Objective:   Physical Exam  Constitutional: He is oriented to person, place, and time. He appears well-developed and well-nourished.  Neck: Normal range of motion. Neck supple.  Cardiovascular: Normal rate, regular rhythm and normal heart sounds.   Pulmonary/Chest: Effort normal and breath sounds normal.  Abdominal: Soft. Bowel sounds are normal.  Musculoskeletal: He exhibits tenderness. He exhibits no edema.  Ambulating with walker. Tenderness to palpation of the greater trochanter bilaterally.   Neurological: He is alert and oriented to person, place, and time.  Skin: Skin is warm and dry.  Psychiatric: He has a normal mood and affect.          Assessment & Plan:  [place] was seen today for no specified reason.  Diagnoses and associated orders for this visit:  Hip bursitis  Type II or unspecified type diabetes mellitus without mention of complication, not stated as  uncontrolled - Ambulatory referral to Podiatry - Ambulatory referral to Ophthalmology  Unspecified essential hypertension  Chronic hip pain  Other Orders - predniSONE (DELTASONE) 20 MG tablet; Take 1 tablet (20 mg total) by mouth 2 (two) times daily with a meal.     Patient to follow up here with primary care  concerns. Referral sent noted. The pain clinic as discussed. No pain medication administered today or in the future from our office for chronic use given history.

## 2014-01-26 ENCOUNTER — Telehealth: Payer: Self-pay | Admitting: Family

## 2014-01-26 NOTE — Telephone Encounter (Signed)
Relevant patient education mailed to patient.  

## 2014-01-28 ENCOUNTER — Telehealth: Payer: Self-pay | Admitting: Family

## 2014-01-28 NOTE — Telephone Encounter (Signed)
Relevant patient education mailed to patient.  

## 2014-01-30 NOTE — Telephone Encounter (Signed)
Miss nancy love is calling back checking on status of dm shoes. Hinda Glatter nor nancy love received order yet. Please call Harriett Sine at [phone]

## 2014-01-31 NOTE — Telephone Encounter (Signed)
Forms have been faxed again to [facility]

## 2014-02-05 ENCOUNTER — Encounter: Payer: Medicare HMO | Attending: Physical Medicine & Rehabilitation

## 2014-02-05 ENCOUNTER — Telehealth: Payer: Self-pay | Admitting: Physical Medicine & Rehabilitation

## 2014-02-05 ENCOUNTER — Ambulatory Visit: Payer: Commercial Managed Care - HMO | Admitting: Physical Medicine & Rehabilitation

## 2014-02-05 DIAGNOSIS — J449 Chronic obstructive pulmonary disease, unspecified: Secondary | ICD-10-CM | POA: Insufficient documentation

## 2014-02-05 DIAGNOSIS — G8929 Other chronic pain: Secondary | ICD-10-CM | POA: Insufficient documentation

## 2014-02-05 DIAGNOSIS — J4489 Other specified chronic obstructive pulmonary disease: Secondary | ICD-10-CM | POA: Insufficient documentation

## 2014-02-05 DIAGNOSIS — I509 Heart failure, unspecified: Secondary | ICD-10-CM | POA: Insufficient documentation

## 2014-02-05 DIAGNOSIS — M25559 Pain in unspecified hip: Secondary | ICD-10-CM | POA: Insufficient documentation

## 2014-02-05 DIAGNOSIS — N189 Chronic kidney disease, unspecified: Secondary | ICD-10-CM | POA: Insufficient documentation

## 2014-02-05 DIAGNOSIS — M24559 Contracture, unspecified hip: Secondary | ICD-10-CM | POA: Insufficient documentation

## 2014-02-05 NOTE — Telephone Encounter (Signed)
Fyi: Pt called in at 4pm; states he was not coming to his appointment on 03/10/2014.Marland Kitchen Pt made aware of his appointment was today at 245 pm... Pt states he did not know this and will take the other option that Dr. Kirtland Bouchard had given him... Per pt he felt uncomfortable being accused of abusing drugs..the patient was advise a note is being placed on his chart... He spoke understanding.Marland Kitchen

## 2014-02-06 NOTE — Telephone Encounter (Signed)
Noted  

## 2014-02-07 ENCOUNTER — Telehealth (HOSPITAL_COMMUNITY): Payer: Self-pay | Admitting: *Deleted

## 2014-02-07 ENCOUNTER — Ambulatory Visit (HOSPITAL_COMMUNITY): Payer: Medicare HMO

## 2014-02-08 ENCOUNTER — Telehealth: Payer: Self-pay | Admitting: Family

## 2014-02-08 NOTE — Telephone Encounter (Signed)
Pt needs a ins referral for dm shoes with custom inserts.pt has [employer]

## 2014-02-11 ENCOUNTER — Ambulatory Visit (HOSPITAL_COMMUNITY): Payer: Medicare HMO

## 2014-02-13 ENCOUNTER — Ambulatory Visit (INDEPENDENT_AMBULATORY_CARE_PROVIDER_SITE_OTHER): Payer: Commercial Managed Care - HMO | Admitting: Podiatry

## 2014-02-13 ENCOUNTER — Encounter: Payer: Self-pay | Admitting: Podiatry

## 2014-02-13 ENCOUNTER — Ambulatory Visit (HOSPITAL_COMMUNITY): Payer: Medicare HMO

## 2014-02-13 VITALS — BP 126/68 | HR 63 | Resp 20 | Ht 70.0 in | Wt 216.0 lb

## 2014-02-13 DIAGNOSIS — M79609 Pain in unspecified limb: Secondary | ICD-10-CM

## 2014-02-13 DIAGNOSIS — R0989 Other specified symptoms and signs involving the circulatory and respiratory systems: Secondary | ICD-10-CM

## 2014-02-13 DIAGNOSIS — B351 Tinea unguium: Secondary | ICD-10-CM

## 2014-02-13 NOTE — Telephone Encounter (Signed)
Left message for Frank Davidson to call back.

## 2014-02-13 NOTE — Patient Instructions (Signed)
Apply triple antibiotic ointment to the right great toenail daily and cover with a Band-Aid until healed. The vascular lab will contact you to schedule an arterial Doppler.  Diabetes and Foot Care Diabetes may cause you to have problems because of poor blood supply (circulation) to your feet and legs. This may cause the skin on your feet to become thinner, break easier, and heal more slowly. Your skin may become dry, and the skin may peel and crack. You may also have nerve damage in your legs and feet causing decreased feeling in them. You may not notice minor injuries to your feet that could lead to infections or more serious problems. Taking care of your feet is one of the most important things you can do for yourself.  HOME CARE INSTRUCTIONS  Wear shoes at all times, even in the house. Do not go barefoot. Bare feet are easily injured.  Check your feet daily for blisters, cuts, and redness. If you cannot see the bottom of your feet, use a mirror or ask someone for help.  Wash your feet with warm water (do not use hot water) and mild soap. Then pat your feet and the areas between your toes until they are completely dry. Do not soak your feet as this can dry your skin.  Apply a moisturizing lotion or petroleum jelly (that does not contain alcohol and is unscented) to the skin on your feet and to dry, brittle toenails. Do not apply lotion between your toes.  Trim your toenails straight across. Do not dig under them or around the cuticle. File the edges of your nails with an emery board or nail file.  Do not cut corns or calluses or try to remove them with medicine.  Wear clean socks or stockings every day. Make sure they are not too tight. Do not wear knee-high stockings since they may decrease blood flow to your legs.  Wear shoes that fit properly and have enough cushioning. To break in new shoes, wear them for just a few hours a day. This prevents you from injuring your feet. Always look in  your shoes before you put them on to be sure there are no objects inside.  Do not cross your legs. This may decrease the blood flow to your feet.  If you find a minor scrape, cut, or break in the skin on your feet, keep it and the skin around it clean and dry. These areas may be cleansed with mild soap and water. Do not cleanse the area with peroxide, alcohol, or iodine.  When you remove an adhesive bandage, be sure not to damage the skin around it.  If you have a wound, look at it several times a day to make sure it is healing.  Do not use heating pads or hot water bottles. They may burn your skin. If you have lost feeling in your feet or legs, you may not know it is happening until it is too late.  Make sure your health care provider performs a complete foot exam at least annually or more often if you have foot problems. Report any cuts, sores, or bruises to your health care provider immediately. SEEK MEDICAL CARE IF:   You have an injury that is not healing.  You have cuts or breaks in the skin.  You have an ingrown nail.  You notice redness on your legs or feet.  You feel burning or tingling in your legs or feet.  You have pain or  cramps in your legs and feet.  Your legs or feet are numb.  Your feet always feel cold. SEEK IMMEDIATE MEDICAL CARE IF:   There is increasing redness, swelling, or pain in or around a wound.  There is a red line that goes up your leg.  Pus is coming from a wound.  You develop a fever or as directed by your health care provider.  You notice a bad smell coming from an ulcer or wound. Document Released: 11/05/2000 Document Revised: 07/11/2013 Document Reviewed: 04/17/2013 [facility] Patient Information 2014 [place].

## 2014-02-13 NOTE — Telephone Encounter (Signed)
Spoke with Harriett Sine who advises that there is form on the [facility] website that needs to be printed, completed and faxed to her Harriett Sine) at which time she will fax it to [facility].  Pt has requested diabetic shoes. He states that he has had them before

## 2014-02-13 NOTE — Progress Notes (Signed)
   Subjective:    Patient ID: Frank Davidson, male    DOB: [birth year], 63 y.o.   MRN: [id number]  HPI N debridement        L B/L 1 - 5 toenails, right 1st toenail medial border        D long-term        O injury to right 1st toenail 2 days ago        C thick, discolored encurvated toenails        A diabetic pt, hip problems        T hx of home pedicures  Patient trimmed the right great toenail with days and once they have decided evaluated as well as requesting debridement of all his toenails.      Review of Systems  Constitutional: Negative.   HENT: Positive for tinnitus.   Eyes: Negative.   Respiratory: Positive for shortness of breath.        CHF and edema  Cardiovascular: Positive for leg swelling.       CHF and edema  Gastrointestinal: Positive for diarrhea.       Medication induced  Endocrine: Negative.   Genitourinary: Positive for frequency.  Musculoskeletal: Positive for arthralgias, gait problem and myalgias.  Allergic/Immunologic: Negative.   Neurological: Positive for numbness.       Foot numbness Pain management in [place] had pt on Lyrica.  Hematological: Negative.   Psychiatric/Behavioral: Negative.        Objective:   Physical Exam  Orientated x64 63 year old black male  Vascular: The left DP 0/4 and the right DP 1/4. PTs are 0/4 bilaterally  Neurological: Sensation to 10 g monofilament wire intact 4/5 right and 1/5 left. The vibratory sensation is nonreactive bilaterally. Ankle reflexes trace reactive bilaterally.  Dermatological: Atrophic skin without a tear growth noted bilaterally. The medial margin the right hallux nail is not visible with a granular base without any erythema, edema, drainage, warmth or malodor noted. All the remaining toenails are elongated, hypertrophic, deformed and tender to palpation.  Musculoskeletal: Left HAV       Assessment & Plan:   Assessment: Loss of protective sensation bilaterally Rule out peripheral  arterial disease because of absent or diminished pedal pulses bilaterally Symptomatic onychomycoses x10 Noninfected right hallux nail from patient's trimming.  Plan: All 10 toenails are debrided without a bleeding. Patient will apply topical antibiotic ointment to the right great toenail daily and cover with Band-Aid until healed.  Refer patient to vascular lab for lower extremity arterial Doppler for the indication of diminished pedal pulses bilaterally. Reappoint at three-month intervals Notify patient on results of arterial Doppler.  After this is summary includes instructions for application of topical antibiotic ointment to the right great toenail daily and general foot care for diabetics provided.

## 2014-02-14 ENCOUNTER — Ambulatory Visit (HOSPITAL_COMMUNITY)
Admission: RE | Admit: 2014-02-14 | Discharge: 2014-02-14 | Disposition: A | Discharge status: Home / Self Care | Payer: Medicare HMO | Source: Ambulatory Visit | Attending: Internal Medicine | Admitting: Internal Medicine

## 2014-02-14 ENCOUNTER — Encounter: Payer: Self-pay | Admitting: Podiatry

## 2014-02-14 ENCOUNTER — Encounter (HOSPITAL_COMMUNITY): Payer: Self-pay

## 2014-02-14 VITALS — BP 133/84 | HR 66 | Resp 18 | Wt 217.4 lb

## 2014-02-14 DIAGNOSIS — I4891 Unspecified atrial fibrillation: Secondary | ICD-10-CM

## 2014-02-14 DIAGNOSIS — N183 Chronic kidney disease, stage 3 unspecified: Secondary | ICD-10-CM

## 2014-02-14 DIAGNOSIS — I5022 Chronic systolic (congestive) heart failure: Secondary | ICD-10-CM

## 2014-02-14 MED ORDER — HYDRALAZINE HCL 50 MG PO TABS: 50.0000 mg | ORAL_TABLET | Freq: Two times a day (BID) | ORAL | Status: DC

## 2014-02-14 MED ORDER — ISOSORBIDE MONONITRATE ER 60 MG PO TB24: 60.0000 mg | ORAL_TABLET | Freq: Every day | ORAL | Status: DC

## 2014-02-14 NOTE — Patient Instructions (Addendum)
Increase your hydralazine to 50 mg twice a day. A new prescription was sent to your pharmacy.  Increase your IMDUR to 60 mg daily. You currently have 30 mg tablets so you will need to take 2 daily until you run out and then your new IMDUR will be 60 mg daily and you will take 1 tablet daily.  Bring all medications to every visit.  F/U 6 weeks.  Do the following things EVERYDAY: 1) Weigh yourself in the morning before breakfast. Write it down and keep it in a log. 2) Take your medicines as prescribed 3) Eat low salt foods-Limit salt (sodium) to 2000 mg per day.  4) Stay as active as you can everyday 5) Limit all fluids for the day to less than 2 liters

## 2014-02-14 NOTE — Progress Notes (Signed)
Patient ID: Frank Davidson, male   DOB: 1951/01/29, 63 y.o.   MRN: [id number]   Weight Range  229 pounds   Baseline proBNP    PCP: Roxy Cedar FNP GI: Dr Reed Breech Rheumatologist:   HPI: Frank Davidson is a 63 yo male with a hx of obesity, PAF, HTN, COPD, HTN, gout, DM 2, CVA and CHF due to NICM with EF 20-25% (06/2013).   Has previously followed in the [facility] in [place]. Previous cath in 2007 showed NICM with normal cors. Was living alone in [place] and apparently was issues managing medications by himself and was getting confused with PCP and cardiologists medications and was not taking his meds correctly. So he moved to [place] near his nieces. Has not been on ACE-I due to renal failure. Highest creatinine in our system is ~1.5 but was apparently higher previously - thought to be related to taking his medications incorrectly.   Was admitted in 06/2013 from [facility] with left sided weakness and was found to have a moderate to large right hemispheric infarct. Went back to [facility].   Admitted to [facility] 10/14 through 09/09/13 with ADHF. Diuresed 30 pounds with IV lasix and transitioned to lasix 80 mg po bid. Continued on 25 mg carvedilol bid, isordil 20 mg daily.  Started on lisinopril 2.5 mg daily. Discharge weight 229 pounds. 09/29/13 CT abdomen and pelvis - no obstruction . Diverticulosis. No inflammatory changes.   Follow up: Last visit started spironolactone 12.5 mg daily which he tolerated He reports doing well other than arthritis pain. He brought medications with him to visit. Denies SOB, orthopnea, CP or edema. Able to walk at least a block with no DOE. Weight at home 209-217 lbs. Has needed a few extra doses of lasix. No BRPBP or melena. Following low salt diet and drinking less than 2L a day. Walks with walker on a good day walks about a block.   ECHO 11/08/13 EF 15%   Labs 09/07/13 K 3.9 Creatinine 1.18  Labs 09/17/13 dig level 1.7 K 4.4 Creatinine 1.41 Pro BNP  2256 Labs 09/25/13 K 6.1 Creatinine 2.57  Labs 09/28/13  K 5.3 Creatinine 1.98  Labs 10/01/13 Dig level 2.5 --->dig stopped Labs 10/05/13 K 4.8 Creatinine 1.79 Labs 11/01/13 K 3.3 --Supplemented Creatinine 1.3 Uric Acid 9.3 AST 23 ALT 12 Hemoglobin 13 Hemoglobin 7.2  Labs 11/21/13 K 4.4 Creatinine 1.3            01/02/14 K 4.2, Creatinine 1.1            01/10/14 K 4.6, Creatinine 1.28  SH: Retired Education officer, [employer] 2011 Lives with his niece  FH: Father DM  Mom had lung cancer  ROS: All systems negative except as listed in HPI, PMH and Problem List.  Past Medical History  Diagnosis Date  . Fibromyalgia   . Atrial fibrillation   . Hypertension   . Osteoarthritis   . COPD (chronic obstructive pulmonary disease)   . Diabetes mellitus without complication   . Coronary artery disease   . Neuropathy   . Ocular herpes   . Gout   . CKD (chronic kidney disease), stage III   . CHF (congestive heart failure)     EF 15%  . Polysubstance abuse     Hx of  . Chronic pain   . Hyperlipidemia   . Nonischemic cardiomyopathy   . Ocular herpes   . Neuropathy   . Left-sided weakness   . Acute on chronic systolic heart failure   .  Stroke     x 5. last stroke aug 2014  . Sleep apnea with use of continuous positive airway pressure (CPAP)     does not use cpap    Current Outpatient Prescriptions  Medication Sig Dispense Refill  . allopurinol (ZYLOPRIM) 100 MG tablet Take 100 mg by mouth daily with breakfast.      . apixaban (ELIQUIS) 5 MG TABS tablet Take 5 mg by mouth 2 (two) times daily.      . calcium carbonate (TUMS - DOSED IN MG ELEMENTAL CALCIUM) 500 MG chewable tablet Chew 1 tablet by mouth 3 (three) times daily.      . carvedilol (COREG) 25 MG tablet Take 25 mg by mouth 2 (two) times daily with a meal.      . furosemide (LASIX) 80 MG tablet Take 80 mg by mouth daily.      Marland Kitchen glipiZIDE (GLUCOTROL) 5 MG tablet Take 5 mg by mouth 2 (two) times daily before a meal.      . isosorbide  mononitrate (IMDUR) 30 MG 24 hr tablet Take 30 mg by mouth daily.      Marland Kitchen lisinopril (PRINIVIL,ZESTRIL) 2.5 MG tablet Take 2.5 mg by mouth 2 (two) times daily.      . pantoprazole (PROTONIX) 40 MG tablet Take 1 tablet (40 mg total) by mouth 2 (two) times daily.  60 tablet  2  . potassium chloride SA (K-DUR,KLOR-CON) 20 MEQ tablet Take 20 mEq by mouth daily.      . predniSONE (DELTASONE) 20 MG tablet Take 1 tablet (20 mg total) by mouth 2 (two) times daily with a meal.  20 tablet  0  . spironolactone (ALDACTONE) 25 MG tablet Take 0.5 tablets (12.5 mg total) by mouth daily.  15 tablet  3  . traMADol (ULTRAM) 50 MG tablet Take 1 tablet (50 mg total) by mouth 4 (four) times daily.  120 tablet  1  . hydrALAZINE (APRESOLINE) 25 MG tablet Take 25 mg by mouth daily.        No current facility-administered medications for this encounter.    Filed Vitals:   02/14/14 1011  BP: 133/84  Pulse: 66  Resp: 18  Weight: 217 lb 6 oz (98.601 kg)  SpO2: 99%   PHYSICAL EXAM: General:  Obese male;  Ambulated into clinic with a walker HEENT: normal Neck: supple. JVP flat.  Carotids 2+ bilaterally; no bruits. No lymphadenopathy or thryomegaly appreciated. Cor: PMI normal. Irregular  rate & rhythm. No rubs, gallops or murmurs. Lungs: clear Abdomen: obese soft, nontender, nondistended. No hepatosplenomegaly. No bruits or masses. Good bowel sounds. Extremities: no cyanosis, clubbing, rash, no edema  Neuro: alert & orientedx3, cranial nerves grossly intact. Affect pleasant.  ASSESSMENT & PLAN:  1) Chronic Systolic Heart Failure: NICM, EF 15-20% (10/2013)  - NYHA II symptoms and volume status stable. Will continue lasix 80 mg daily and instructed to take an additional lasix if weight >219 lbs.  -Coreg at goal dose 25 mg BID. -Continue lisinopril 2.5 mg BID and spiro 12.5 mg daily. Last Cr 1.28 and K+ 4.6. - Pt reports not taking hydralazine as prescribed and takes it hydralazine 25 mg BID and IMDUR 30 mg daily.  He reports he can't remember to take TID. Will increase hydralazine to 50 mg BID and IMDUR to 60 mg daily and resent prescriptions. - labs today with [facility]   - Not currently interested in ICD, met with Dr. Lovena Le. - Reinforced the need and importance of daily weights, a  low sodium diet, and fluid restriction (less than 2 L a day). Instructed to call the HF clinic if weight increases more than 3 lbs overnight or 5 lbs in a week.  2) Gout - Continue allopurinol. Continue to follow with rheumatology.  3) Atrial fibrillation-  Rate controlled, continue coreg BID. on eliquis 5 mg bid. No s/s of bleeding. 4) Chronic renal failure, stage II-III- Check BMET today.   F/U 6 weeks with medications.  Junie Bame B NP-C 10:30 AM

## 2014-02-15 ENCOUNTER — Telehealth (HOSPITAL_COMMUNITY): Payer: Self-pay | Admitting: *Deleted

## 2014-02-15 ENCOUNTER — Ambulatory Visit (HOSPITAL_COMMUNITY): Payer: Medicare HMO

## 2014-02-15 ENCOUNTER — Other Ambulatory Visit: Payer: Self-pay | Admitting: Family

## 2014-02-15 NOTE — Telephone Encounter (Signed)
Ok to fill 

## 2014-02-18 ENCOUNTER — Ambulatory Visit (HOSPITAL_COMMUNITY): Payer: Medicare HMO

## 2014-02-20 ENCOUNTER — Ambulatory Visit (HOSPITAL_COMMUNITY): Payer: Medicare HMO

## 2014-02-20 ENCOUNTER — Telehealth: Payer: Self-pay | Admitting: Family

## 2014-02-20 DIAGNOSIS — M7070 Other bursitis of hip, unspecified hip: Secondary | ICD-10-CM

## 2014-02-20 MED ORDER — PREDNISONE 20 MG PO TABS: 20.0000 mg | ORAL_TABLET | Freq: Two times a day (BID) | ORAL | Status: DC

## 2014-02-20 NOTE — Telephone Encounter (Signed)
Pt states the predniSONE (DELTASONE) 20 MG tablet cleared up the bursitis, but after the med was gone it has come back. Pt would like a refill sent to [facility]/ [address]

## 2014-02-20 NOTE — Telephone Encounter (Signed)
Refill sent. Referral to orthopedics instead for bursitis of hip

## 2014-02-22 ENCOUNTER — Ambulatory Visit (HOSPITAL_COMMUNITY): Payer: Medicare HMO

## 2014-02-22 ENCOUNTER — Telehealth (HOSPITAL_COMMUNITY): Payer: Self-pay | Admitting: *Deleted

## 2014-02-25 ENCOUNTER — Ambulatory Visit (HOSPITAL_COMMUNITY): Payer: Medicare HMO

## 2014-02-27 ENCOUNTER — Ambulatory Visit (HOSPITAL_COMMUNITY): Payer: Medicare HMO

## 2014-02-28 ENCOUNTER — Encounter: Payer: Self-pay | Admitting: Internal Medicine

## 2014-03-01 ENCOUNTER — Ambulatory Visit (HOSPITAL_COMMUNITY): Payer: Medicare HMO

## 2014-03-04 ENCOUNTER — Ambulatory Visit (HOSPITAL_COMMUNITY): Payer: Medicare HMO

## 2014-03-04 ENCOUNTER — Other Ambulatory Visit (HOSPITAL_COMMUNITY): Payer: Self-pay | Admitting: Cardiology

## 2014-03-04 ENCOUNTER — Telehealth (HOSPITAL_COMMUNITY): Payer: Self-pay | Admitting: *Deleted

## 2014-03-04 DIAGNOSIS — I509 Heart failure, unspecified: Secondary | ICD-10-CM

## 2014-03-04 MED ORDER — METOLAZONE 5 MG PO TABS: ORAL_TABLET | ORAL | Status: DC

## 2014-03-04 NOTE — Telephone Encounter (Signed)
Pt called with concerns about increased weight ( up 12 lbs) 229 today  Per VO Amy Clegg, NP Take metolazone 2.5mg  daily x 2 days Call office if no improvement

## 2014-03-06 ENCOUNTER — Ambulatory Visit (HOSPITAL_COMMUNITY): Payer: Medicare HMO

## 2014-03-08 ENCOUNTER — Encounter: Payer: Self-pay | Admitting: Internal Medicine

## 2014-03-08 ENCOUNTER — Ambulatory Visit (HOSPITAL_COMMUNITY): Payer: Medicare HMO

## 2014-03-11 ENCOUNTER — Ambulatory Visit (HOSPITAL_COMMUNITY): Payer: Medicare HMO

## 2014-03-12 ENCOUNTER — Telehealth (HOSPITAL_COMMUNITY): Payer: Self-pay | Admitting: *Deleted

## 2014-03-13 ENCOUNTER — Ambulatory Visit (HOSPITAL_COMMUNITY): Payer: Medicare HMO

## 2014-03-13 ENCOUNTER — Encounter: Payer: Self-pay | Admitting: Family

## 2014-03-15 ENCOUNTER — Ambulatory Visit (HOSPITAL_COMMUNITY): Payer: Medicare HMO

## 2014-03-18 ENCOUNTER — Ambulatory Visit (HOSPITAL_COMMUNITY): Payer: Medicare HMO

## 2014-03-18 ENCOUNTER — Other Ambulatory Visit (HOSPITAL_COMMUNITY): Payer: Self-pay | Admitting: Podiatry

## 2014-03-18 DIAGNOSIS — R0989 Other specified symptoms and signs involving the circulatory and respiratory systems: Secondary | ICD-10-CM

## 2014-03-20 ENCOUNTER — Ambulatory Visit (HOSPITAL_COMMUNITY): Payer: Medicare HMO

## 2014-03-22 ENCOUNTER — Ambulatory Visit (HOSPITAL_COMMUNITY): Payer: Medicare HMO

## 2014-03-22 DIAGNOSIS — I739 Peripheral vascular disease, unspecified: Secondary | ICD-10-CM

## 2014-03-22 HISTORY — DX: Peripheral vascular disease, unspecified: I73.9

## 2014-03-25 ENCOUNTER — Ambulatory Visit (HOSPITAL_COMMUNITY): Payer: Medicare HMO

## 2014-03-26 ENCOUNTER — Telehealth: Payer: Self-pay | Admitting: *Deleted

## 2014-03-26 NOTE — Telephone Encounter (Signed)
Patient's lower arterial doppler requires authorization from [facility].  All pertinent information was sent to Dr. Caryl Never for the referral and to [facility].

## 2014-03-27 ENCOUNTER — Ambulatory Visit (HOSPITAL_COMMUNITY): Payer: Medicare HMO

## 2014-03-27 NOTE — Telephone Encounter (Signed)
Patient's Vascular Study does not require Precert per [facility].  I called and left a message for Selena Batten at [facility] at [facility].

## 2014-03-28 ENCOUNTER — Ambulatory Visit (HOSPITAL_COMMUNITY)
Admission: RE | Admit: 2014-03-28 | Discharge: 2014-03-28 | Disposition: A | Discharge status: Home / Self Care | Payer: Medicare HMO | Source: Ambulatory Visit | Attending: Cardiology | Admitting: Cardiology

## 2014-03-28 ENCOUNTER — Encounter (HOSPITAL_COMMUNITY): Payer: Medicare HMO

## 2014-03-28 DIAGNOSIS — R0989 Other specified symptoms and signs involving the circulatory and respiratory systems: Secondary | ICD-10-CM | POA: Insufficient documentation

## 2014-03-28 DIAGNOSIS — I739 Peripheral vascular disease, unspecified: Secondary | ICD-10-CM

## 2014-03-28 NOTE — Progress Notes (Signed)
Arterial Duplex Lower Ext. Completed. Tylie Golonka, BS, RDMS, RVT  

## 2014-03-29 ENCOUNTER — Ambulatory Visit (HOSPITAL_COMMUNITY): Payer: Medicare HMO

## 2014-04-01 ENCOUNTER — Ambulatory Visit (HOSPITAL_COMMUNITY): Payer: Medicare HMO

## 2014-04-01 ENCOUNTER — Ambulatory Visit (HOSPITAL_COMMUNITY)
Admission: RE | Admit: 2014-04-01 | Discharge: 2014-04-01 | Disposition: A | Discharge status: Home / Self Care | Payer: Medicare HMO | Source: Ambulatory Visit | Attending: Internal Medicine | Admitting: Internal Medicine

## 2014-04-01 VITALS — BP 90/56 | HR 61 | Wt 234.8 lb

## 2014-04-01 DIAGNOSIS — I509 Heart failure, unspecified: Secondary | ICD-10-CM

## 2014-04-01 DIAGNOSIS — I4891 Unspecified atrial fibrillation: Secondary | ICD-10-CM

## 2014-04-01 DIAGNOSIS — I5022 Chronic systolic (congestive) heart failure: Secondary | ICD-10-CM

## 2014-04-01 LAB — PRO B NATRIURETIC PEPTIDE: Pro B Natriuretic peptide (BNP): 872.9 pg/mL — ABNORMAL HIGH (ref 0–125)

## 2014-04-01 MED ORDER — METOLAZONE 5 MG PO TABS: 5.0000 mg | ORAL_TABLET | ORAL | Status: DC

## 2014-04-01 NOTE — Patient Instructions (Signed)
Take Metolazone for 3 days  While taking Metolazone take an extra 20 meq of Potassium  Labs today  Your physician recommends that you schedule a follow-up appointment in: Friday

## 2014-04-01 NOTE — Addendum Note (Signed)
Encounter addended by: Noralee Space, RN on: 04/01/2014 11:08 AM<BR>     Documentation filed: Patient Instructions Section, Orders

## 2014-04-01 NOTE — Progress Notes (Signed)
Patient ID: Frank Davidson, male   DOB: 1951/07/10, 63 y.o.   MRN: [id number]   Weight Range  216 pounds   Baseline proBNP    PCP: Roxy Cedar FNP GI: Dr Reed Breech Rheumatologist:   HPI: Frank Davidson is a 63 yo male with a hx of obesity, PAF, HTN, COPD, HTN, gout, DM 2, CVA and CHF due to NICM with EF 20-25% (06/2013).   Has previously followed in the [facility] in [place]. Previous cath in 2007 showed NICM with normal cors. Was living alone in [place] and apparently was issues managing medications by himself and was getting confused with PCP and cardiologists medications and was not taking his meds correctly. So he moved to [place] near his nieces. Has not been on ACE-I due to renal failure. Highest creatinine in our system is ~1.5 but was apparently higher previously - thought to be related to taking his medications incorrectly.   Was admitted in 06/2013 from [facility] with left sided weakness and was found to have a moderate to large right hemispheric infarct. Went back to [facility].   Admitted to [facility] 10/14 through 09/09/13 with ADHF. Diuresed 30 pounds with IV lasix and transitioned to lasix 80 mg po bid. Continued on 25 mg carvedilol bid, isordil 20 mg daily.  Started on lisinopril 2.5 mg daily. Discharge weight 229 pounds. 09/29/13 CT abdomen and pelvis - no obstruction . Diverticulosis. No inflammatory changes.   Follow up: Last visit hydralazine switched to BID because he was unable to take TID. About 3 weeks ago started taking Lyrica for hip pain and began to retain fluid and have insomnia. Stopped it 3 days ago. Weight up 17 pounds. + edema. Increased lasix from $RemoveBe'80mg'wWmGmRomu$  daily to 80/40. Did not have metolazone. Weigh down only 1 pound. Occasional DOE. 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  Past Medical History  Diagnosis Date  . Fibromyalgia   . Atrial fibrillation   . Hypertension   . Osteoarthritis   . COPD (chronic obstructive pulmonary disease)   . Diabetes mellitus without complication   . Coronary artery disease   . Neuropathy   . Ocular herpes   . Gout   . CKD (chronic kidney disease), stage III   . CHF (congestive heart failure)     EF 15%  . Polysubstance abuse     Hx of  . Chronic pain   . Hyperlipidemia   . Nonischemic cardiomyopathy   . Ocular herpes   . Neuropathy   . Left-sided weakness   . Acute on chronic systolic heart failure   . Stroke     x 5. last stroke aug 2014  .  Sleep apnea with use of continuous positive airway pressure (CPAP)     does not use cpap    Current Outpatient Prescriptions  Medication Sig Dispense Refill  . allopurinol (ZYLOPRIM) 100 MG tablet Take 100 mg by mouth daily with breakfast.      . apixaban (ELIQUIS) 5 MG TABS tablet Take 5 mg by mouth 2 (two) times daily.      . calcium carbonate (TUMS - DOSED IN MG ELEMENTAL CALCIUM) 500 MG chewable tablet Chew 1 tablet by mouth 3 (three) times daily.      . carvedilol (COREG) 25 MG tablet Take 25 mg by mouth 2 (two) times daily with a meal.      . furosemide (LASIX) 80 MG tablet Has been taking 80 mg in AM and 40 mg in PM      . glipiZIDE (GLUCOTROL) 5 MG tablet Take 5 mg by mouth 2 (two) times daily before a meal.      . hydrALAZINE (APRESOLINE) 50 MG tablet Take 1 tablet (50 mg total) by mouth 2 (two) times  daily.  60 tablet  3  . HYDROcodone-acetaminophen (NORCO) 10-325 MG per tablet Take 1 tablet by mouth 2 (two) times daily as needed.      . isosorbide mononitrate (IMDUR) 60 MG 24 hr tablet Take 1 tablet (60 mg total) by mouth daily.  30 tablet  3  . lisinopril (PRINIVIL,ZESTRIL) 2.5 MG tablet Take 2.5 mg by mouth 2 (two) times daily.      . metolazone (ZAROXOLYN) 5 MG tablet Take one half tab x 2 days, then as needed per MD instruction  5 tablet  3  . pantoprazole (PROTONIX) 40 MG tablet Take 1 tablet (40 mg total) by mouth 2 (two) times daily.  60 tablet  2  . potassium chloride SA (K-DUR,KLOR-CON) 20 MEQ tablet Take 20 mEq by mouth daily.      Marland Kitchen spironolactone (ALDACTONE) 25 MG tablet Take 25 mg by mouth daily.       No current facility-administered medications for this encounter.    Filed Vitals:   04/01/14 1028  BP: 90/56  Pulse: 61  Weight: 234 lb 12 oz (106.482 kg)  SpO2: 98%   PHYSICAL EXAM: General:  Obese male;  Ambulated into clinic with a walker HEENT: normal Neck: supple. Unable to see JVP.  Carotids 2+ bilaterally; no bruits. No lymphadenopathy or thryomegaly appreciated. Cor: PMI normal. Irregular  rate & rhythm. No rubs, gallops or murmurs. Lungs: clear Abdomen: obese soft, nontender, + distended. No hepatosplenomegaly. No bruits or masses. Good bowel sounds. Extremities: no cyanosis, clubbing, rash,  1+ L> R edema  Neuro: alert & orientedx3, cranial nerves grossly intact. Affect pleasant.  ASSESSMENT & PLAN:  1) Chronic Systolic Heart Failure: NICM, EF 15-20% (10/2013)  - Weight up about 17 pounds. However only looks mildly volume overloaded on exam. We offered IV lasix in Clinic but he couldn't stay. Will give metolazone 5 mg daily for 3 days with 12m extra potassium each time.  -Coreg at goal dose 25 mg BID. -Continue lisinopril 2.5 mg BID and spiro 12.5 mg daily. Last Cr 1.28 and K+ 4.6. -Continue hydralazine to 50 mg BID and IMDUR to 60 mg daily and resent  prescriptions. -Labs today. See back Friday.  - Not currently interested in ICD, met with Dr. TLovena Le - Reinforced the need and importance of daily weights, a low sodium diet, and fluid restriction (less than 2 L a day). Instructed to call the HF  clinic if weight increases more than 3 lbs overnight or 5 lbs in a week.  2) Gout - Continue allopurinol. Continue to follow with rheumatology.  3) Atrial fibrillation-  Rate controlled, continue coreg BID. on eliquis 5 mg bid. No s/s of bleeding. 4) Chronic renal failure, stage II-III- Check BMET today.   Jolaine Artist MD 10:52 AM

## 2014-04-03 ENCOUNTER — Other Ambulatory Visit (HOSPITAL_COMMUNITY): Payer: Self-pay | Admitting: Gastroenterology

## 2014-04-03 ENCOUNTER — Ambulatory Visit (HOSPITAL_COMMUNITY): Payer: Medicare HMO

## 2014-04-05 ENCOUNTER — Encounter (HOSPITAL_COMMUNITY): Payer: Self-pay

## 2014-04-05 ENCOUNTER — Ambulatory Visit (HOSPITAL_COMMUNITY): Payer: Medicare HMO

## 2014-04-05 ENCOUNTER — Inpatient Hospital Stay (HOSPITAL_COMMUNITY): Admission: RE | Admit: 2014-04-05 | Payer: Medicare HMO | Source: Ambulatory Visit

## 2014-04-08 ENCOUNTER — Ambulatory Visit: Payer: Commercial Managed Care - HMO | Admitting: Family Medicine

## 2014-04-08 ENCOUNTER — Ambulatory Visit: Payer: Commercial Managed Care - HMO

## 2014-04-08 ENCOUNTER — Ambulatory Visit (HOSPITAL_COMMUNITY): Payer: Medicare HMO

## 2014-04-08 VITALS — BP 98/60 | HR 66 | Temp 97.7°F | Resp 16 | Ht 69.0 in | Wt 223.6 lb

## 2014-04-08 DIAGNOSIS — M25512 Pain in left shoulder: Principal | ICD-10-CM

## 2014-04-08 DIAGNOSIS — M25511 Pain in right shoulder: Secondary | ICD-10-CM

## 2014-04-08 DIAGNOSIS — M542 Cervicalgia: Secondary | ICD-10-CM

## 2014-04-08 DIAGNOSIS — M1990 Unspecified osteoarthritis, unspecified site: Secondary | ICD-10-CM

## 2014-04-08 DIAGNOSIS — T148XXA Other injury of unspecified body region, initial encounter: Secondary | ICD-10-CM

## 2014-04-08 DIAGNOSIS — M129 Arthropathy, unspecified: Secondary | ICD-10-CM

## 2014-04-08 DIAGNOSIS — M25519 Pain in unspecified shoulder: Secondary | ICD-10-CM

## 2014-04-08 MED ORDER — CYCLOBENZAPRINE HCL 5 MG PO TABS: ORAL_TABLET | ORAL | Status: DC

## 2014-04-08 NOTE — Progress Notes (Signed)
Chief Complaint:  Chief Complaint  Patient presents with  . Shoulder Pain    x 3 days  . Neck Pain    x 3 days    HPI: Frank Davidson is a 63 y.o. male who is here for  3 day history of neck and bilateral shouldee pain He has been using walker since October, he has had a stroke, he has been oversusing his arms.  He denies any neck inuuries or shoudler injuries. He has pain with ROM, he may be getting a cold so this is not helping his shoulde rpain per patient Denies numbness tingling , just pain 9/10 sharp pain with over head movement He as tried Set designerlector pacthce son his shoulder which helps, but they are expensive  He has pain management, Preferred MAnagement Norco 10/325 mg BID He takes norco and also tylenol supplement   Past Medical History  Diagnosis Date  . Fibromyalgia   . Atrial fibrillation   . Hypertension   . Osteoarthritis   . COPD (chronic obstructive pulmonary disease)   . Diabetes mellitus without complication   . Coronary artery disease   . Neuropathy   . Ocular herpes   . Gout   . CKD (chronic kidney disease), stage III   . CHF (congestive heart failure)     EF 15%  . Chronic pain   . Hyperlipidemia   . Nonischemic cardiomyopathy   . Ocular herpes   . Neuropathy   . Left-sided weakness   . Acute on chronic systolic heart failure   . Stroke     x 5. last stroke aug 2014  . Sleep apnea with use of continuous positive airway pressure (CPAP)     does not use cpap   Past Surgical History  Procedure Laterality Date  . 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 Cardiac catheterization  10/2006    normal coronary arteries  . Tonsillectomy  age 63  . Esophagogastroduodenoscopy N/A 12/24/2013    Procedure: ESOPHAGOGASTRODUODENOSCOPY (EGD);  Surgeon: Louis Meckelobert D Kaplan, MD;  [facility];  Service: Endoscopy;  Laterality: N/A;  . Colonoscopy N/A 12/24/2013    Procedure: COLONOSCOPY;  Surgeon: Louis Meckelobert D Kaplan, MD;  Location: [facility];  Service:  Endoscopy;  Laterality: N/A;   History   Social History  . Marital Status: Divorced    Spouse Name: N/A    Number of Children: N/A  . Years of Education: N/A   Social History Main Topics  . Smoking status: Former Smoker -- 0.50 packs/day for 10 years    Types: Cigarettes    Quit date: 12/18/2006  . Smokeless tobacco: Never Used  . Alcohol Use: No  . Drug Use: Yes    Special: Cocaine, Marijuana     Comment: last used marijuana and coaine 2008  . Sexual Activity: None   Other Topics Concern  . None   Social History Narrative  . None   Family History  Problem Relation Age of Onset  . Cancer Mother   . Hypertension Mother   . Diabetes Father   . Diabetes Sister   . Hypertension Sister   . Hypertension Brother   . Hypertension Sister   . Colon cancer Neg Hx    Allergies  Allergen Reactions  . Actos [Pioglitazone] Other (See Comments)    Caused blood in urine and fluid retention    Prior to Admission medications   Medication Sig Start Date End Date Taking? Authorizing  Provider  allopurinol (ZYLOPRIM) 100 MG tablet Take 100 mg by mouth daily with breakfast. 11/01/13  Yes Baker Pierini, FNP  apixaban (ELIQUIS) 5 MG TABS tablet Take 5 mg by mouth 2 (two) times daily. 11/01/13  Yes Baker Pierini, FNP  calcium carbonate (TUMS - DOSED IN MG ELEMENTAL CALCIUM) 500 MG chewable tablet Chew 1 tablet by mouth 3 (three) times daily.   Yes Historical Provider, MD  carvedilol (COREG) 25 MG tablet Take 25 mg by mouth 2 (two) times daily with a meal. 11/01/13  Yes Baker Pierini, FNP  furosemide (LASIX) 80 MG tablet Has been taking 80 mg in AM and 40 mg in PM 11/08/13  Yes Amy D Clegg, NP  glipiZIDE (GLUCOTROL) 5 MG tablet Take 5 mg by mouth 2 (two) times daily before a meal. 11/01/13  Yes Baker Pierini, FNP  hydrALAZINE (APRESOLINE) 50 MG tablet Take 1 tablet (50 mg total) by mouth 2 (two) times daily. 02/14/14  Yes Aundria Rud, NP  HYDROcodone-acetaminophen  (NORCO) 10-325 MG per tablet Take 1 tablet by mouth 2 (two) times daily as needed.   Yes Historical Provider, MD  isosorbide mononitrate (IMDUR) 60 MG 24 hr tablet Take 1 tablet (60 mg total) by mouth daily. 02/14/14  Yes Aundria Rud, NP  lisinopril (PRINIVIL,ZESTRIL) 2.5 MG tablet Take 2.5 mg by mouth 2 (two) times daily. 11/08/13  Yes Amy D Clegg, NP  metolazone (ZAROXOLYN) 5 MG tablet Take 1 tablet (5 mg total) by mouth as directed. Take one half tab x 2 days, then as needed per MD instruction 04/01/14  Yes Bevelyn Buckles Bensimhon, MD  pantoprazole (PROTONIX) 40 MG tablet TAKE 1 TABLET (40 MG TOTAL) BY MOUTH 2 (TWO) TIMES DAILY. 04/03/14  Yes Louis Meckel, MD  potassium chloride SA (K-DUR,KLOR-CON) 20 MEQ tablet Take 20 mEq by mouth daily. 11/01/13  Yes Baker Pierini, FNP  spironolactone (ALDACTONE) 25 MG tablet Take 25 mg by mouth daily. 01/02/14  Yes Aundria Rud, NP     ROS: The patient denies fevers, chills, night sweats, unintentional weight loss, chest pain, palpitations, wheezing, dyspnea on exertion, nausea, vomiting, abdominal pain, dysuria, hematuria, melena, numbness, weakness, or tingling.   All other systems have been reviewed and were otherwise negative with the exception of those mentioned in the HPI and as above.    PHYSICAL EXAM: Filed Vitals:   04/08/14 1156  BP: 98/60  Pulse: 66  Temp: 97.7 F (36.5 C)  Resp: 16   Filed Vitals:   04/08/14 1156  Height: 5\' 9"  (1.753 m)  Weight: 223 lb 9.6 oz (101.424 kg)   Body mass index is 33 kg/(m^2).  General: Alert, no acute distress HEENT:  Normocephalic, atraumatic, oropharynx patent. EOMI, PERRLA Cardiovascular:  Regular rate and rhythm, no rubs murmurs or gallops.  No Carotid bruits, radial pulse intact. No pedal edema.  Respiratory: Clear to auscultation bilaterally.  No wheezes, rales, or rhonchi.  No cyanosis, no use of accessory musculature GI: No organomegaly, abdomen is soft and non-tender, positive bowel  sounds.  No masses. Skin: No rashes. Neurologic: Facial musculature symmetric. Psychiatric: Patient is appropriate throughout our interaction. Lymphatic: No cervical lymphadenopathy Musculoskeletal: Gait intact. ROM n neck is normal except he has pain with ROM Neg spurling Pain with extension of neck SHoulders-no deformities, + pain with  IR /ER Lift off test is poor Neg empty can    LABS: Results for orders placed during the hospital encounter of 04/01/14  PRO B NATRIURETIC PEPTIDE      Result Value Ref Range   Pro B Natriuretic peptide (BNP) 872.9 (*) 0 - 125 pg/mL     EKG/XRAY:   Primary read interpreted by Dr. Conley Rolls at [facility]. Neg for fracture or dislocation + DJD   ASSESSMENT/PLAN: Encounter Diagnoses  Name Primary?  . Bilateral shoulder pain Yes  . Neck pain   . Arthritis   . Sprain and strain    Rotator cuff, arthritis in etiology due to overuse since using walker due to stroke rehab.   Patient is already on pain medications, he is seen by chronic pain managagment Rx  flexeril Refer to ortho or to pain manamgent for ESI, he will do it through his pain clinic he states so I will leave it to him for now unless he wants [place] to refer Gross sideeffects, risk and benefits, and alternatives of medications d/w patient. Patient is aware that all medications have potential sideeffects and we are unable to predict every sideeffect or drug-drug interaction that may occur.  Lenell Antu, DO 04/08/2014 2:26 PM

## 2014-04-10 ENCOUNTER — Ambulatory Visit (HOSPITAL_COMMUNITY): Payer: Medicare HMO

## 2014-04-10 ENCOUNTER — Telehealth: Payer: Self-pay | Admitting: *Deleted

## 2014-04-10 DIAGNOSIS — I739 Peripheral vascular disease, unspecified: Secondary | ICD-10-CM

## 2014-04-10 NOTE — Telephone Encounter (Signed)
Message copied by Enedina Finner on Wed Apr 10, 2014  9:28 AM ------      Message from: Carrington Clamp      Created: Thu Apr 04, 2014 12:08 PM       Results of the lower extremity arterial Doppler dated a seventh 2015      Summary: This is an abnormal lower extremity arterial duplex Doppler evaluation            Contact patient and schedule followup visit with Dr. Allyson Sabal ------

## 2014-04-10 NOTE — Telephone Encounter (Signed)
I called and left a message with the patient's niece, that his doppler results were abnormal so Dr. Leeanne Deed is referring him to Dr. Nanetta Batty.  I informed her that it's the same location where he had the doppler performed.  Someone should contact them to get the appointment scheduled.

## 2014-04-12 ENCOUNTER — Ambulatory Visit (HOSPITAL_COMMUNITY): Payer: Medicare HMO

## 2014-04-15 ENCOUNTER — Ambulatory Visit (HOSPITAL_COMMUNITY): Payer: Medicare HMO

## 2014-04-17 ENCOUNTER — Ambulatory Visit (HOSPITAL_COMMUNITY): Payer: Medicare HMO

## 2014-04-19 ENCOUNTER — Ambulatory Visit (HOSPITAL_COMMUNITY): Payer: Medicare HMO

## 2014-04-19 ENCOUNTER — Ambulatory Visit (INDEPENDENT_AMBULATORY_CARE_PROVIDER_SITE_OTHER): Payer: Medicare HMO | Admitting: Cardiovascular Disease

## 2014-04-19 ENCOUNTER — Encounter: Payer: Self-pay | Admitting: Cardiovascular Disease

## 2014-04-19 VITALS — BP 108/60 | HR 68 | Ht 70.0 in | Wt 231.6 lb

## 2014-04-19 DIAGNOSIS — I739 Peripheral vascular disease, unspecified: Secondary | ICD-10-CM | POA: Insufficient documentation

## 2014-04-19 DIAGNOSIS — R0989 Other specified symptoms and signs involving the circulatory and respiratory systems: Secondary | ICD-10-CM

## 2014-04-19 NOTE — Progress Notes (Signed)
04/19/2014 Frank Davidson   September 27, 1951  [id number]  Primary Physician CAMPBELL, Sherran NeedsPADONDA BOYD, FNP Primary Cardiologist: Runell GessJonathan J. Doll Frazee MD Roseanne RenoFACP,FACC,FAHA, FSCAI   HPI:  Frank Davidson is a delightful 63 year old moderately overweight divorced African American male father of 5 children, grandfather and 10 grandchildren. He was referred by Dr. Santiago Bumpersichard Tuchman for peripheral vascular evaluation because of decreased pedal pulses. His primary care physician is Dr. Warner MccreedyJessica Copeland and his cardiologist is Dr. Gala RomneyBensimhon. Frank Davidson has a doctorate in psychology and educational administration. 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 He has a long history notable for nonischemic cardiomyopathy, hypertension, diabetes, atrial fibrillation. He has had heart failure in the past. Dr. Leeanne Deeduchman referred in here for lower extremity arterial Doppler studies which revealed normal ABIs and near normal ABIs with no evidence of obstructive disease on duplex imaging suggesting that he can have trivial trimming without significant peripheral vascular risk.   Current Outpatient Prescriptions  Medication Sig Dispense Refill  . allopurinol (ZYLOPRIM) 100 MG tablet Take 100 mg by mouth daily with breakfast.      . apixaban (ELIQUIS) 5 MG TABS tablet Take 5 mg by mouth 2 (two) times daily.      . calcium carbonate (TUMS - DOSED IN MG ELEMENTAL CALCIUM) 500 MG chewable tablet Chew 1 tablet by mouth 3 (three) times daily.      . carvedilol (COREG) 25 MG tablet Take 25 mg by mouth 2 (two) times daily with a meal.      . cyclobenzaprine (FLEXERIL) 5 MG tablet Take 1/2-1 tab po nightly for pain/muscle spasms  30 tablet  0  . furosemide (LASIX) 80 MG tablet Has been taking 80 mg in AM and 40 mg in PM      . glipiZIDE (GLUCOTROL) 5 MG tablet Take 5 mg by mouth 2 (two) times daily before a meal.      . hydrALAZINE (APRESOLINE) 50 MG tablet Take 1 tablet (50 mg total) by mouth 2 (two) times daily.  60 tablet  3   . HYDROcodone-acetaminophen (NORCO) 10-325 MG per tablet Take 1 tablet by mouth 2 (two) times daily as needed.      . isosorbide mononitrate (IMDUR) 60 MG 24 hr tablet Take 1 tablet (60 mg total) by mouth daily.  30 tablet  3  . lisinopril (PRINIVIL,ZESTRIL) 2.5 MG tablet Take 2.5 mg by mouth 2 (two) times daily.      . metolazone (ZAROXOLYN) 5 MG tablet Take 1 tablet (5 mg total) by mouth as directed. Take one half tab x 2 days, then as needed per MD instruction  6 tablet  3  . pantoprazole (PROTONIX) 40 MG tablet TAKE 1 TABLET (40 MG TOTAL) BY MOUTH 2 (TWO) TIMES DAILY.  60 tablet  2  . potassium chloride SA (K-DUR,KLOR-CON) 20 MEQ tablet Take 20 mEq by mouth daily.      Marland Kitchen. spironolactone (ALDACTONE) 25 MG tablet Take 25 mg by mouth daily.      Marland Kitchen. FLECTOR 1.3 % PTCH Place 0.5 patches onto the skin as needed.      Marland Kitchen. LYRICA 150 MG capsule Take 1 capsule by mouth as needed.      . predniSONE (DELTASONE) 20 MG tablet Take 1 tablet by mouth as needed.       No current facility-administered medications for this visit.    Allergies  Allergen Reactions  . Actos [Pioglitazone] Other (See Comments)    Caused blood in urine and fluid retention  History   Social History  . Marital Status: Divorced    Spouse Name: N/A    Number of Children: N/A  . Years of Education: N/A   Occupational History  . Not on file.   Social History Main Topics  . Smoking status: Former Smoker -- 0.50 packs/day for 10 years    Types: Cigarettes    Quit date: 12/18/2006  . Smokeless tobacco: Never Used  . Alcohol Use: No  . Drug Use: Yes    Special: Cocaine, Marijuana     Comment: last used marijuana and coaine 2008  . Sexual Activity: Not on file   Other Topics Concern  . Not on file   Social History Narrative  . No narrative on file     Review of Systems: General: negative for chills, fever, night sweats or weight changes.  Cardiovascular: negative for chest pain, dyspnea on exertion, edema,  orthopnea, palpitations, paroxysmal nocturnal dyspnea or shortness of breath Dermatological: negative for rash Respiratory: negative for cough or wheezing Urologic: negative for hematuria Abdominal: negative for nausea, vomiting, diarrhea, bright red blood per rectum, melena, or hematemesis Neurologic: negative for visual changes, syncope, or dizziness All other systems reviewed and are otherwise negative except as noted above.    Blood pressure 108/60, pulse 68, height 5\' 10"  (1.778 m), weight 231 lb 9.6 oz (105.053 kg).  General appearance: alert and no distress Neck: no adenopathy, no carotid bruit, no JVD, supple, symmetrical, trachea midline and thyroid not enlarged, symmetric, no tenderness/mass/nodules Lungs: clear to auscultation bilaterally Heart: regular rate and rhythm, S1, S2 normal, no murmur, click, rub or gallop Extremities: mild edema and edema with venostasis changes. Palpable pedal pulses  EKG not performed today  ASSESSMENT AND PLAN:   Decreased pedal pulses Frank Davidson was referred to me by Dr. Durel Salts, podiatrist, he could have decreased pedal pulses. He was sent to Dr. Leeanne Deed for trimming his toenails. Frank Davidson was then sent here for lower extremity arterial Doppler studies which were essentially normal. I believe it would be safe for him to undergo any podiatry procedure at this time. I will see him back when necessary      Runell Gess MD [facility], [facility] 04/19/2014 12:55 PM

## 2014-04-19 NOTE — Patient Instructions (Signed)
Your physician recommends that you schedule a follow-up appointment on as needed basis

## 2014-04-19 NOTE — Assessment & Plan Note (Signed)
Frank Davidson was referred to me by Dr. Durel Salts, podiatrist, he could have decreased pedal pulses. He was sent to Dr. Leeanne Deed for trimming his toenails. Mr. Pochop was then sent here for lower extremity arterial Doppler studies which were essentially normal. I believe it would be safe for him to undergo any podiatry procedure at this time. I will see him back when necessary

## 2014-04-22 ENCOUNTER — Ambulatory Visit: Payer: Commercial Managed Care - HMO | Admitting: Family Medicine

## 2014-04-22 ENCOUNTER — Encounter: Payer: Self-pay | Admitting: Family Medicine

## 2014-04-22 ENCOUNTER — Encounter: Payer: Self-pay | Admitting: Internal Medicine

## 2014-04-22 ENCOUNTER — Ambulatory Visit (HOSPITAL_COMMUNITY): Payer: Medicare HMO

## 2014-04-22 VITALS — BP 110/64 | HR 63 | Temp 98.6°F | Resp 16 | Ht 67.0 in | Wt 235.4 lb

## 2014-04-22 DIAGNOSIS — I10 Essential (primary) hypertension: Secondary | ICD-10-CM

## 2014-04-22 DIAGNOSIS — E118 Type 2 diabetes mellitus with unspecified complications: Principal | ICD-10-CM

## 2014-04-22 DIAGNOSIS — IMO0002 Reserved for concepts with insufficient information to code with codable children: Secondary | ICD-10-CM

## 2014-04-22 DIAGNOSIS — M109 Gout, unspecified: Secondary | ICD-10-CM

## 2014-04-22 DIAGNOSIS — E1165 Type 2 diabetes mellitus with hyperglycemia: Secondary | ICD-10-CM

## 2014-04-22 LAB — URIC ACID: URIC ACID, SERUM: 6.3 mg/dL (ref 4.0–7.8)

## 2014-04-22 LAB — BASIC METABOLIC PANEL
BUN: 28 mg/dL — ABNORMAL HIGH (ref 6–23)
CALCIUM: 9.9 mg/dL (ref 8.4–10.5)
CO2: 26 meq/L (ref 19–32)
Chloride: 105 mEq/L (ref 96–112)
Creat: 1.58 mg/dL — ABNORMAL HIGH (ref 0.50–1.35)
Glucose, Bld: 68 mg/dL — ABNORMAL LOW (ref 70–99)
Potassium: 4 mEq/L (ref 3.5–5.3)
SODIUM: 139 meq/L (ref 135–145)

## 2014-04-22 LAB — HEMOGLOBIN A1C
HEMOGLOBIN A1C: 6.1 % — AB (ref <5.7)
Mean Plasma Glucose: 128 mg/dL — ABNORMAL HIGH (ref <117)

## 2014-04-22 MED ORDER — PREDNISONE 20 MG PO TABS: ORAL_TABLET | ORAL | Status: DC

## 2014-04-22 MED ORDER — POTASSIUM CHLORIDE CRYS ER 20 MEQ PO TBCR: 20.0000 meq | EXTENDED_RELEASE_TABLET | Freq: Every day | ORAL | Status: DC

## 2014-04-22 NOTE — Progress Notes (Signed)
[facility] [address], [place] [address] [phone]  Date:  04/22/2014   Name:  Frank BloodgoodWayne X Davidson   DOB:  02/16/1951   MRN:  [id number]  PCP:  Janell QuietAMPBELL, PADONDA BOYD, FNP    Chief Complaint: Follow-up, Medication Refill, here to establish care and tongue discoloration x 1 month   History of Present Illness:  Frank Davidson is a 63 y.o. very pleasant male patient who presents with the following:  Here today to establish care and go over a few issues.  History of CHF- saw Dr. Allyson SabalBerry very recently to discuss circulation in his feet.  However her regular cardiologist is Dr. Teressa LowerBensimohn. Had been followed by [facility] in [place].   He is on eliquis, coreg, lasix, imdur among others.   He would like to stop protonix as his GERD sx are now better.  His "orthopedist wants to do a left hip replacement," but he is not sure about this.  [facility]  He uses a walker and is in pain management for this hip.  He is a pt at [facility], he uses hydrocodone.   History of DM- last A1c in December was 7.2%.   He reports getting a pneumonia shot a couple of years ago [facility].    He does take allupurinol for gout prophylaixis. He may also take prednisone 20mg  for 5 days as needed for acute attacks.  He thinks he took this twice since December.  He is breathing ok.  He does use metolazone for diuresis, as well as laxix.   Wt Readings from Last 3 Encounters:  04/22/14 235 lb 6.4 oz (106.777 kg)  04/19/14 231 lb 9.6 oz (105.053 kg)  04/08/14 223 lb 9.6 oz (101.424 kg)    Patient Active Problem List   Diagnosis Date Noted  . Decreased pedal pulses 04/19/2014  . Acute esophagitis 12/24/2013  . Dysphagia, unspecified(787.20) 10/01/2013  . Diarrhea 10/01/2013  . Acute on chronic systolic heart failure   . Acute CHF 09/04/2013  . Chest pain 09/04/2013  . Ocular herpes   . Neuropathy   . Left-sided weakness   . Acute on chronic  diastolic CHF (congestive heart failure), NYHA class 3 07/09/2013  . Acute ischemic stroke 07/09/2013  . OSA (obstructive sleep apnea) 07/09/2013  . Gout attack 07/07/2013  . Chronic systolic heart failure 07/05/2013  . Stroke 07/03/2013  . CVA (cerebral infarction) 07/03/2013  . A-fib 07/03/2013  . Hypokalemia 07/03/2013  . Hypertension   . COPD (chronic obstructive pulmonary disease)   . Diabetes mellitus without complication   . CKD (chronic kidney disease), stage III   . CHF (congestive heart failure)   . Nonischemic cardiomyopathy   . Polysubstance abuse     Past Medical History  Diagnosis Date  . Fibromyalgia   . Atrial fibrillation   . Hypertension   . Osteoarthritis   . COPD (chronic obstructive pulmonary disease)   . Diabetes mellitus without complication   . Coronary artery disease   . Neuropathy   . Ocular herpes   . Gout   . CKD (chronic kidney disease), stage III   . CHF (congestive heart failure)     EF 15%  . Chronic pain   . Hyperlipidemia   . Nonischemic cardiomyopathy   . Ocular herpes   . Neuropathy   . Left-sided weakness   . Acute on chronic systolic heart failure   . Stroke  x 5. last stroke aug 2014  . Sleep apnea with use of continuous positive airway pressure (CPAP)     does not use cpap  . Decreased pedal pulses     Past Surgical History  Procedure Laterality Date  . 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 Cardiac catheterization  10/2006    normal coronary arteries  . Tonsillectomy  age 55  . Esophagogastroduodenoscopy N/A 12/24/2013    Procedure: ESOPHAGOGASTRODUODENOSCOPY (EGD);  Surgeon: Louis Meckel, MD;  Location: [facility];  Service: Endoscopy;  Laterality: N/A;  . Colonoscopy N/A 12/24/2013    Procedure: COLONOSCOPY;  Surgeon: Louis Meckel, MD;  Location: [facility];  Service: Endoscopy;  Laterality: N/A;    History  Substance Use Topics  . Smoking status: Former Smoker -- 0.50 packs/day for 10 years    Types:  Cigarettes    Quit date: 12/18/2006  . Smokeless tobacco: Never Used  . Alcohol Use: No    Family History  Problem Relation Age of Onset  . Cancer Mother   . Hypertension Mother   . Lung disease Mother   . Diabetes Father   . Diabetes Sister   . Hypertension Sister   . Hypertension Brother   . Hypertension Sister   . Colon cancer Neg Hx     Allergies  Allergen Reactions  . Actos [Pioglitazone] Other (See Comments)    Caused blood in urine and fluid retention     Medication list has been reviewed and updated.  Current Outpatient Prescriptions on File Prior to Visit  Medication Sig Dispense Refill  . allopurinol (ZYLOPRIM) 100 MG tablet Take 100 mg by mouth daily with breakfast.      . apixaban (ELIQUIS) 5 MG TABS tablet Take 5 mg by mouth 2 (two) times daily.      . calcium carbonate (TUMS - DOSED IN MG ELEMENTAL CALCIUM) 500 MG chewable tablet Chew 1 tablet by mouth 3 (three) times daily.      . carvedilol (COREG) 25 MG tablet Take 25 mg by mouth 2 (two) times daily with a meal.      . cyclobenzaprine (FLEXERIL) 5 MG tablet Take 1/2-1 tab po nightly for pain/muscle spasms  30 tablet  0  . FLECTOR 1.3 % PTCH Place 0.5 patches onto the skin as needed.      . furosemide (LASIX) 80 MG tablet Has been taking 80 mg in AM and 40 mg in PM      . glipiZIDE (GLUCOTROL) 5 MG tablet Take 5 mg by mouth 2 (two) times daily before a meal.      . hydrALAZINE (APRESOLINE) 50 MG tablet Take 1 tablet (50 mg total) by mouth 2 (two) times daily.  60 tablet  3  . HYDROcodone-acetaminophen (NORCO) 10-325 MG per tablet Take 1 tablet by mouth 2 (two) times daily as needed.      . isosorbide mononitrate (IMDUR) 60 MG 24 hr tablet Take 1 tablet (60 mg total) by mouth daily.  30 tablet  3  . lisinopril (PRINIVIL,ZESTRIL) 2.5 MG tablet Take 2.5 mg by mouth 2 (two) times daily.      Marland Kitchen LYRICA 150 MG capsule Take 1 capsule by mouth as needed.      . metolazone (ZAROXOLYN) 5 MG tablet Take 1 tablet (5 mg  total) by mouth as directed. Take one half tab x 2 days, then as needed per MD instruction  6 tablet  3  . pantoprazole (PROTONIX) 40 MG  tablet TAKE 1 TABLET (40 MG TOTAL) BY MOUTH 2 (TWO) TIMES DAILY.  60 tablet  2  . potassium chloride SA (K-DUR,KLOR-CON) 20 MEQ tablet Take 20 mEq by mouth daily.      . predniSONE (DELTASONE) 20 MG tablet Take 1 tablet by mouth as needed.      Marland Kitchen spironolactone (ALDACTONE) 25 MG tablet Take 25 mg by mouth daily.       No current facility-administered medications on file prior to visit.    Review of Systems:  As per HPI- otherwise negative.   Physical Examination: Filed Vitals:   04/22/14 1024  BP: 110/64  Pulse: 63  Temp: 98.6 F (37 C)  Resp: 16   Filed Vitals:   04/22/14 1024  Height: 5\' 7"  (1.702 m)  Weight: 235 lb 6.4 oz (106.777 kg)   Body mass index is 36.86 kg/(m^2). Ideal Body Weight: Weight in (lb) to have BMI = 25: 159.3  GEN: WDWN, NAD, Non-toxic, A & O x 3, looks well, large build HEENT: Atraumatic, Normocephalic. Neck supple. No masses, No LAD. Ears and Nose: No external deformity. CV: RRR, No M/G/R. No JVD. No thrill. No extra heart sounds. PULM: CTA B, no wheezes, crackles, rhonchi. No retractions. No resp. distress. No accessory muscle use. ABD: S, NT, ND EXTR: No c/c/e NEURO Normal gait.  PSYCH: Normally interactive. Conversant. Not depressed or anxious appearing.  Calm demeanor.    Assessment and Plan: Type II or unspecified type diabetes mellitus with unspecified complication, uncontrolled - Plan: Hemoglobin A1c  HTN (hypertension) - Plan: potassium chloride SA (K-DUR,KLOR-CON) 20 MEQ tablet, Basic metabolic panel  Gout - Plan: Uric Acid, predniSONE (DELTASONE) 20 MG tablet  Await labs and will be in touch with him.  Refilled his K as above.  He is on spirolactone, so need to watch his K carefully.  However he takes 2 kinds of diuretics at a fairly high dose so likely does still need his K.   Refilled prednisone  for him to use as needed for gout flares.  Check uric acid in case he needs higher dose of allopurinol.    Signed Abbe Amsterdam, MD

## 2014-04-22 NOTE — Patient Instructions (Signed)
I will be in touch with your labs.  Good to see you again!  Let's plan to follow-up in about 3 months  We will check your uric acid level- perhaps we need to increase your allopurinal dose to prevent gout attacks. You can use prednisone as needed for a few days at a time- however remember this will increase your blood sugar.

## 2014-04-23 ENCOUNTER — Encounter: Payer: Self-pay | Admitting: Family Medicine

## 2014-04-23 NOTE — Addendum Note (Signed)
Addended by: Abbe Amsterdam C on: 04/23/2014 12:51 PM   Modules accepted: Orders

## 2014-04-24 ENCOUNTER — Other Ambulatory Visit (HOSPITAL_COMMUNITY): Payer: Self-pay | Admitting: Anesthesiology

## 2014-04-24 ENCOUNTER — Ambulatory Visit (HOSPITAL_COMMUNITY): Payer: Medicare HMO

## 2014-04-26 ENCOUNTER — Ambulatory Visit (HOSPITAL_COMMUNITY): Payer: Medicare HMO

## 2014-04-26 NOTE — Progress Notes (Addendum)
Patient ID: Frank Davidson, male   DOB: Jun 07, 1951, 63 y.o.   MRN: [id number]   Weight Range  216 pounds   Baseline proBNP    PCP: Frank Davidson GI: Frank Davidson Rheumatologist:   HPI: Frank Davidson is a 63 yo male with a hx of obesity, PAF, HTN, COPD, HTN, gout, DM 2, CVA and CHF due to NICM with EF 20-25% (06/2013).   Has previously followed in the [facility] in [place]. Previous cath in 2007 showed NICM with normal cors. Was living alone in [place] and apparently was issues managing medications by himself and was getting confused with PCP and cardiologists medications and was not taking his meds correctly. So he moved to [place] near his nieces. Has not been on ACE-I due to renal failure. Highest creatinine in our system is ~1.5 but was apparently higher previously - thought to be related to taking his medications incorrectly.   Was admitted in 06/2013 from [facility] with left sided weakness and was found to have a moderate to large right hemispheric infarct. Went back to [facility].   Ad[facility] 10/14 through 09/09/13 with ADHF. Diuresed 30 pounds with IV lasix and transitioned to lasix 80 mg po bid. Continued on 25 mg carvedilol bid, isordil 20 mg daily.  Started on lisinopril 2.5 mg daily. Discharge weight 229 pounds. 09/29/13 CT abdomen and pelvis - no obstruction . Diverticulosis. No inflammatory changes.    He returns for follow up. Last office visit he was given metolazone 5 mg for 3 days. Weight went down 10 pounds but has since gone up again. Denies SOB/PND/Orthopnea. Weight at home 225- 234 pounds. Able to walk 1/2 mile a day.and yesterday he walked 3 miles. Complains hip pain.  Taking all medication. Eating high salt foods such as a Campbell soup and oodles and noodles. He refuses ICD.   ECHO 11/08/13 EF 15%   Labs 09/07/13 K 3.9 Creatinine 1.18  Labs 09/17/13 dig level 1.7 K 4.4 Creatinine 1.41 Pro BNP 2256 Labs 09/25/13 K 6.1 Creatinine 2.57  Labs 09/28/13  K  5.3 Creatinine 1.98  Labs 10/01/13 Dig level 2.5 --->dig stopped Labs 10/05/13 K 4.8 Creatinine 1.79 Labs 11/01/13 K 3.3 --Supplemented Creatinine 1.3 Uric Acid 9.3 AST 23 ALT 12 Hemoglobin 13 Hemoglobin 7.2  Labs 11/21/13 K 4.4 Creatinine 1.3            01/02/14 K 4.2, Creatinine 1.1            01/10/14 K 4.6, Creatinine 1.28           04/22/14 K 4.0 Creatinine 1.58  SH: Retired Education officer, [employer] 2011 Lives with his niece  FH: Father DM  Mom had lung cancer  ROS: All systems negative except as listed in HPI, PMH and Problem List.  Past Medical History  Diagnosis Date  . Fibromyalgia   . Atrial fibrillation   . Hypertension   . Osteoarthritis   . COPD (chronic obstructive pulmonary disease)   . Diabetes mellitus without complication   . Coronary artery disease   . Neuropathy   . Ocular herpes   . Gout   . CKD (chronic kidney disease), stage III   . CHF (congestive heart failure)     EF 15%  . Chronic pain   . Hyperlipidemia   . Nonischemic cardiomyopathy   . Ocular herpes   . Neuropathy   . Left-sided weakness   . Acute on chronic systolic heart failure   . Stroke  x 5. last stroke aug 2014  . Sleep apnea with use of continuous positive airway pressure (CPAP)     does not use cpap  . Decreased pedal pulses     Current Outpatient Prescriptions  Medication Sig Dispense Refill  . allopurinol (ZYLOPRIM) 100 MG tablet Take 100 mg by mouth daily with breakfast.      . apixaban (ELIQUIS) 5 MG TABS tablet Take 5 mg by mouth 2 (two) times daily.      . calcium carbonate (TUMS - DOSED IN MG ELEMENTAL CALCIUM) 500 MG chewable tablet Chew 1 tablet by mouth 3 (three) times daily.      . carvedilol (COREG) 25 MG tablet Take 25 mg by mouth 2 (two) times daily with a meal.      . cyclobenzaprine (FLEXERIL) 5 MG tablet Take 1/2-1 tab po nightly for pain/muscle spasms  30 tablet  0  . FLECTOR 1.3 % PTCH Place 0.5 patches onto the skin as needed.      . furosemide (LASIX) 80 MG tablet  Has been taking 80 mg in AM and 40 mg in PM      . glipiZIDE (GLUCOTROL) 5 MG tablet Take 5 mg by mouth 2 (two) times daily before a meal.      . hydrALAZINE (APRESOLINE) 50 MG tablet Take 1 tablet (50 mg total) by mouth 2 (two) times daily.  60 tablet  3  . HYDROcodone-acetaminophen (NORCO) 10-325 MG per tablet Take 1 tablet by mouth 2 (two) times daily as needed.      . isosorbide mononitrate (IMDUR) 60 MG 24 hr tablet Take 1 tablet (60 mg total) by mouth daily.  30 tablet  3  . lisinopril (PRINIVIL,ZESTRIL) 2.5 MG tablet Take 2.5 mg by mouth 2 (two) times daily.      Marland Kitchen LYRICA 150 MG capsule Take 1 capsule by mouth as needed.      . metolazone (ZAROXOLYN) 5 MG tablet Take 1 tablet (5 mg total) by mouth as directed. Take one half tab x 2 days, then as needed per MD instruction  6 tablet  3  . pantoprazole (PROTONIX) 40 MG tablet TAKE 1 TABLET (40 MG TOTAL) BY MOUTH 2 (TWO) TIMES DAILY.  60 tablet  2  . potassium chloride SA (K-DUR,KLOR-CON) 20 MEQ tablet Take 1 tablet (20 mEq total) by mouth daily.  30 tablet  6  . predniSONE (DELTASONE) 20 MG tablet Take 1 pill daily for 3-5 days as needed for gout attack  20 tablet  0  . spironolactone (ALDACTONE) 25 MG tablet Take 25 mg by mouth daily.      Marland Kitchen spironolactone (ALDACTONE) 25 MG tablet TAKE 0.5 TABLETS (12.5 MG TOTAL) BY MOUTH DAILY.  15 tablet  3   No current facility-administered medications for this encounter.    Filed Vitals:   04/29/14 1050  BP: 148/74  Pulse: 88  Weight: 234 lb 12.8 oz (106.505 kg)  SpO2: 96%   PHYSICAL EXAM: General:  Obese male;  Ambulated into clinic with a walker HEENT: normal Neck: supple. Hard to see JVP but appears mildly elevated.  Carotids 2+ bilaterally; no bruits. No lymphadenopathy or thryomegaly appreciated. Cor: PMI normal. Irregular  rate & rhythm. No rubs, gallops or murmurs. Lungs: clear Abdomen: obese soft, nontender, + distended. No hepatosplenomegaly. No bruits or masses. Good bowel  sounds. Extremities: no cyanosis, clubbing, rash,  edema.  Neuro: alert & orientedx3, cranial nerves grossly intact. Affect pleasant.  ASSESSMENT & PLAN:  1) Chronic  Systolic Heart Failure: NICM, EF 15-20% (10/2013) NYHA II.  Weight up another  11 pounds Overall 28 pounds.  On exam his fluid does not look overly elevated.  Increase lasix to 80 mg twice a day.  -Coreg at goal dose 25 mg BID. -Continue lisinopril 2.5 mg BID and spiro 12.5 mg daily. -Continue hydralazine to 50 mg BID and IMDUR to 60 mg daily  - Not currently interested in ICD, he has met with Frank. Lovena Le. - Reinforced the need and importance of daily weights, a low sodium diet and to avoid soups, and fluid restriction (less than 2 L a day). Instructed to call the HF clinic if weight increases more than 3 lbs overnight or 5 lbs in a week.  2) Gout - Continue allopurinol. Continue to follow with rheumatology.  3) Atrial fibrillation-  Rate controlled, continue coreg BID. on eliquis 5 mg bid. No s/s of bleeding. 4) Chronic renal failure, stage II-III-Check BMET when he returns.    Follow up in 2 weeks with BMET.    Amy D Clegg  NP-C 11:05 AM

## 2014-04-29 ENCOUNTER — Encounter (HOSPITAL_COMMUNITY): Payer: Self-pay

## 2014-04-29 ENCOUNTER — Ambulatory Visit (HOSPITAL_COMMUNITY)
Admission: RE | Admit: 2014-04-29 | Discharge: 2014-04-29 | Disposition: A | Discharge status: Home / Self Care | Payer: Medicare HMO | Source: Ambulatory Visit | Attending: Internal Medicine | Admitting: Internal Medicine

## 2014-04-29 ENCOUNTER — Ambulatory Visit (HOSPITAL_COMMUNITY): Payer: Medicare HMO

## 2014-04-29 VITALS — BP 148/74 | HR 88 | Wt 234.8 lb

## 2014-04-29 DIAGNOSIS — I2510 Atherosclerotic heart disease of native coronary artery without angina pectoris: Secondary | ICD-10-CM | POA: Insufficient documentation

## 2014-04-29 DIAGNOSIS — Z791 Long term (current) use of non-steroidal anti-inflammatories (NSAID): Secondary | ICD-10-CM | POA: Insufficient documentation

## 2014-04-29 DIAGNOSIS — B009 Herpesviral infection, unspecified: Secondary | ICD-10-CM | POA: Insufficient documentation

## 2014-04-29 DIAGNOSIS — Z9989 Dependence on other enabling machines and devices: Secondary | ICD-10-CM | POA: Insufficient documentation

## 2014-04-29 DIAGNOSIS — E785 Hyperlipidemia, unspecified: Secondary | ICD-10-CM | POA: Insufficient documentation

## 2014-04-29 DIAGNOSIS — E669 Obesity, unspecified: Secondary | ICD-10-CM | POA: Insufficient documentation

## 2014-04-29 DIAGNOSIS — IMO0001 Reserved for inherently not codable concepts without codable children: Secondary | ICD-10-CM | POA: Insufficient documentation

## 2014-04-29 DIAGNOSIS — E119 Type 2 diabetes mellitus without complications: Secondary | ICD-10-CM | POA: Insufficient documentation

## 2014-04-29 DIAGNOSIS — I5022 Chronic systolic (congestive) heart failure: Secondary | ICD-10-CM

## 2014-04-29 DIAGNOSIS — I509 Heart failure, unspecified: Secondary | ICD-10-CM | POA: Insufficient documentation

## 2014-04-29 DIAGNOSIS — I4891 Unspecified atrial fibrillation: Secondary | ICD-10-CM

## 2014-04-29 DIAGNOSIS — J449 Chronic obstructive pulmonary disease, unspecified: Secondary | ICD-10-CM | POA: Insufficient documentation

## 2014-04-29 DIAGNOSIS — I428 Other cardiomyopathies: Secondary | ICD-10-CM | POA: Insufficient documentation

## 2014-04-29 DIAGNOSIS — M109 Gout, unspecified: Secondary | ICD-10-CM | POA: Insufficient documentation

## 2014-04-29 DIAGNOSIS — Z8673 Personal history of transient ischemic attack (TIA), and cerebral infarction without residual deficits: Secondary | ICD-10-CM | POA: Insufficient documentation

## 2014-04-29 DIAGNOSIS — M1990 Unspecified osteoarthritis, unspecified site: Secondary | ICD-10-CM | POA: Insufficient documentation

## 2014-04-29 DIAGNOSIS — N183 Chronic kidney disease, stage 3 unspecified: Secondary | ICD-10-CM

## 2014-04-29 DIAGNOSIS — J4489 Other specified chronic obstructive pulmonary disease: Secondary | ICD-10-CM | POA: Insufficient documentation

## 2014-04-29 DIAGNOSIS — G4730 Sleep apnea, unspecified: Secondary | ICD-10-CM | POA: Insufficient documentation

## 2014-04-29 DIAGNOSIS — Z79899 Other long term (current) drug therapy: Secondary | ICD-10-CM | POA: Insufficient documentation

## 2014-04-29 DIAGNOSIS — G589 Mononeuropathy, unspecified: Secondary | ICD-10-CM | POA: Insufficient documentation

## 2014-04-29 DIAGNOSIS — I10 Essential (primary) hypertension: Secondary | ICD-10-CM | POA: Insufficient documentation

## 2014-04-29 DIAGNOSIS — Z7901 Long term (current) use of anticoagulants: Secondary | ICD-10-CM | POA: Insufficient documentation

## 2014-04-29 DIAGNOSIS — G8929 Other chronic pain: Secondary | ICD-10-CM | POA: Insufficient documentation

## 2014-04-29 MED ORDER — FUROSEMIDE 80 MG PO TABS: 80.0000 mg | ORAL_TABLET | Freq: Two times a day (BID) | ORAL | Status: DC

## 2014-04-29 NOTE — Patient Instructions (Signed)
Follow up in 2 weeks   Take lasix 80 mg twice a day  Do the following things EVERYDAY: 1) Weigh yourself in the morning before breakfast. Write it down and keep it in a log. 2) Take your medicines as prescribed 3) Eat low salt foods-Limit salt (sodium) to 2000 mg per day.  4) Stay as active as you can everyday 5) Limit all fluids for the day to less than 2 liters

## 2014-05-01 ENCOUNTER — Ambulatory Visit (HOSPITAL_COMMUNITY): Payer: Medicare HMO

## 2014-05-03 ENCOUNTER — Ambulatory Visit (HOSPITAL_COMMUNITY): Payer: Medicare HMO

## 2014-05-04 ENCOUNTER — Other Ambulatory Visit: Payer: Self-pay | Admitting: Family

## 2014-05-06 ENCOUNTER — Ambulatory Visit (HOSPITAL_COMMUNITY): Payer: Medicare HMO

## 2014-05-08 ENCOUNTER — Ambulatory Visit (HOSPITAL_COMMUNITY): Payer: Medicare HMO

## 2014-05-09 ENCOUNTER — Encounter: Payer: Self-pay | Admitting: Internal Medicine

## 2014-05-10 ENCOUNTER — Ambulatory Visit (HOSPITAL_COMMUNITY): Payer: Medicare HMO

## 2014-05-10 ENCOUNTER — Other Ambulatory Visit: Payer: Self-pay | Admitting: Family Medicine

## 2014-05-10 NOTE — Progress Notes (Signed)
Patient ID: Frank Davidson, male   DOB: 06-07-1951, 63 y.o.   MRN: [id number]   Weight Range  216 pounds   Baseline proBNP    PCP: Roxy Cedar FNP GI: Dr Reed Breech Rheumatologist:   HPI: [name] is a 63 yo male with a hx of obesity, PAF, HTN, COPD, HTN, gout, DM 2, CVA and CHF due to NICM with EF 20-25% (06/2013).   Has previously followed in the [facility] in [place]. Previous cath in 2007 showed NICM with normal cors. Was living alone in [place] and apparently was issues managing medications by himself and was getting confused with PCP and cardiologists medications and was not taking his meds correctly. So he moved to [place] near his nieces. Has not been on ACE-I due to renal failure. Highest creatinine in our system is ~1.5 but was apparently higher previously - thought to be related to taking his medications incorrectly.   Was admitted in 06/2013 from [facility] with left sided weakness and was found to have a moderate to large right hemispheric infarct. Went back to [facility].   Admitted to [facility] 10/14 through 09/09/13 with ADHF. Diuresed 30 pounds with IV lasix and transitioned to lasix 80 mg po bid. Continued on 25 mg carvedilol bid, isordil 20 mg daily.  Started on lisinopril 2.5 mg daily. Discharge weight 229 pounds. 09/29/13 CT abdomen and pelvis - no obstruction . Diverticulosis. No inflammatory changes.    He returns for follow up. Last visit lasix was increased to 80 mg twice a day.  Weight did go down to 225 Just returned for  [place] and he was out of lasix for few days. He returns Weight at home trending up to 235 pounds. Mild dyspnea with exertion and PND. Drinking > 2 liters of fluid.   ECHO 11/08/13 EF 15%   Labs 09/07/13 K 3.9 Creatinine 1.18  Labs 09/17/13 dig level 1.7 K 4.4 Creatinine 1.41 Pro BNP 2256 Labs 09/25/13 K 6.1 Creatinine 2.57  Labs 09/28/13  K 5.3 Creatinine 1.98  Labs 10/01/13 Dig level 2.5 --->dig stopped Labs 10/05/13 K 4.8 Creatinine  1.79 Labs 11/01/13 K 3.3 --Supplemented Creatinine 1.3 Uric Acid 9.3 AST 23 ALT 12 Hemoglobin 13 Hemoglobin 7.2  Labs 11/21/13 K 4.4 Creatinine 1.3            01/02/14 K 4.2, Creatinine 1.1            01/10/14 K 4.6, Creatinine 1.28           04/22/14 K 4.0 Creatinine 1.58  SH: Retired Education officer, [employer] 2011 Lives with his niece  FH: Father DM  Mom had lung cancer  ROS: All systems negative except as listed in HPI, PMH and Problem List.  Past Medical History  Diagnosis Date  . Fibromyalgia   . Atrial fibrillation   . Hypertension   . Osteoarthritis   . COPD (chronic obstructive pulmonary disease)   . Diabetes mellitus without complication   . Coronary artery disease   . Neuropathy   . Ocular herpes   . Gout   . CKD (chronic kidney disease), stage III   . CHF (congestive heart failure)     EF 15%  . Chronic pain   . Hyperlipidemia   . Nonischemic cardiomyopathy   . Ocular herpes   . Neuropathy   . Left-sided weakness   . Acute on chronic systolic heart failure   . Stroke     x 5. last stroke aug 2014  . Sleep  apnea with use of continuous positive airway pressure (CPAP)     does not use cpap  . Decreased pedal pulses     Current Outpatient Prescriptions  Medication Sig Dispense Refill  . allopurinol (ZYLOPRIM) 100 MG tablet Take 100 mg by mouth daily with breakfast.      . apixaban (ELIQUIS) 5 MG TABS tablet Take 5 mg by mouth 2 (two) times daily.      . calcium carbonate (TUMS - DOSED IN MG ELEMENTAL CALCIUM) 500 MG chewable tablet Chew 1 tablet by mouth 3 (three) times daily.      . carvedilol (COREG) 25 MG tablet Take 25 mg by mouth 2 (two) times daily with a meal.      . cyclobenzaprine (FLEXERIL) 5 MG tablet Take 1/2-1 tab po nightly for pain/muscle spasms  30 tablet  0  . ELIQUIS 5 MG TABS tablet TAKE 1 TABLET (5 MG TOTAL) BY MOUTH 2 (TWO) TIMES DAILY. * INS WILL PAY 12/17*  60 tablet  5  . FLECTOR 1.3 % PTCH Place 0.5 patches onto the skin as needed.      . furosemide  (LASIX) 80 MG tablet Take 1 tablet (80 mg total) by mouth 2 (two) times daily.  60 tablet  6  . glipiZIDE (GLUCOTROL) 5 MG tablet Take 5 mg by mouth 2 (two) times daily before a meal.      . hydrALAZINE (APRESOLINE) 50 MG tablet Take 1 tablet (50 mg total) by mouth 2 (two) times daily.  60 tablet  3  . HYDROcodone-acetaminophen (NORCO) 10-325 MG per tablet Take 1 tablet by mouth 2 (two) times daily as needed.      . isosorbide mononitrate (IMDUR) 60 MG 24 hr tablet Take 1 tablet (60 mg total) by mouth daily.  30 tablet  3  . lisinopril (PRINIVIL,ZESTRIL) 2.5 MG tablet Take 2.5 mg by mouth 2 (two) times daily.      Marland Kitchen LYRICA 150 MG capsule Take 1 capsule by mouth as needed.      . metolazone (ZAROXOLYN) 5 MG tablet Take 1 tablet (5 mg total) by mouth as directed. Take one half tab x 2 days, then as needed per MD instruction  6 tablet  3  . pantoprazole (PROTONIX) 40 MG tablet TAKE 1 TABLET (40 MG TOTAL) BY MOUTH 2 (TWO) TIMES DAILY.  60 tablet  2  . potassium chloride SA (K-DUR,KLOR-CON) 20 MEQ tablet Take 1 tablet (20 mEq total) by mouth daily.  30 tablet  6  . predniSONE (DELTASONE) 20 MG tablet Take 1 pill daily for 3-5 days as needed for gout attack  20 tablet  0  . spironolactone (ALDACTONE) 25 MG tablet TAKE 0.5 TABLETS (12.5 MG TOTAL) BY MOUTH DAILY.  15 tablet  3   No current facility-administered medications for this encounter.    Filed Vitals:   05/13/14 1108  BP: 119/73  Pulse: 71  Resp: 16  Weight: 235 lb 4 oz (106.709 kg)  SpO2: 99%   PHYSICAL EXAM: General:  Obese male;  Ambulated into clinic with a walker HEENT: normal Neck: supple. Hard to see JVP and appears mildly elevated.  Carotids 2+ bilaterally; no bruits. No lymphadenopathy or thryomegaly appreciated. Cor: PMI normal. Irregular  rate & rhythm. No rubs, gallops or murmurs. Lungs: clear Abdomen: obese soft, nontender, + distended. No hepatosplenomegaly. No bruits or masses. Good bowel sounds. Extremities: no  cyanosis, clubbing, rash,  edema.  Neuro: alert & orientedx3, cranial nerves grossly intact. Affect pleasant.  ASSESSMENT & PLAN:  1) Chronic Systolic Heart Failure: NICM, EF 15-20% (10/2013) NYHA II.   Weight up another  1 pounds Overall 29 pounds.  Volume status elevated.  Increase lasix to 80 mg twice a day and metolazone 5 mg for 2 days.  -Coreg at goal dose 25 mg BID. -Continue lisinopril 2.5 mg BID and spiro 12.5 mg daily. -Continue hydralazine to 50 mg BID and IMDUR to 60 mg daily  Check BMET and Pro bnp  - Not currently interested in ICD, he has met with Dr. Lovena Le. - Reinforced the need and importance of daily weights, a low sodium diet and to avoid soups, and fluid restriction (less than 2 L a day). Instructed to call the HF clinic if weight increases more than 3 lbs overnight or 5 lbs in a week.  2) Gout - Continue allopurinol. Continue to follow with rheumatology.  3) Atrial fibrillation-  Rate controlled, continue coreg BID. on eliquis 5 mg bid. No s/s of bleeding. 4) Chronic renal failure, stage II-III- Check BMET today.   Follow up next week. Check BMET.    CLEGG,AMY  NP-C 11:19 AM

## 2014-05-13 ENCOUNTER — Encounter (HOSPITAL_COMMUNITY): Payer: Self-pay

## 2014-05-13 ENCOUNTER — Ambulatory Visit (HOSPITAL_COMMUNITY): Payer: Medicare HMO

## 2014-05-13 ENCOUNTER — Ambulatory Visit (HOSPITAL_COMMUNITY)
Admission: RE | Admit: 2014-05-13 | Discharge: 2014-05-13 | Disposition: A | Discharge status: Home / Self Care | Payer: Medicare HMO | Source: Ambulatory Visit | Attending: Cardiology | Admitting: Cardiology

## 2014-05-13 ENCOUNTER — Telehealth (HOSPITAL_COMMUNITY): Payer: Self-pay

## 2014-05-13 VITALS — BP 119/73 | HR 71 | Resp 16 | Wt 235.2 lb

## 2014-05-13 DIAGNOSIS — I5022 Chronic systolic (congestive) heart failure: Secondary | ICD-10-CM

## 2014-05-13 DIAGNOSIS — I10 Essential (primary) hypertension: Secondary | ICD-10-CM | POA: Insufficient documentation

## 2014-05-13 DIAGNOSIS — I509 Heart failure, unspecified: Secondary | ICD-10-CM | POA: Insufficient documentation

## 2014-05-13 LAB — BASIC METABOLIC PANEL
BUN: 30 mg/dL — ABNORMAL HIGH (ref 6–23)
CHLORIDE: 101 meq/L (ref 96–112)
CO2: 25 meq/L (ref 19–32)
Calcium: 9.5 mg/dL (ref 8.4–10.5)
Creatinine, Ser: 1.46 mg/dL — ABNORMAL HIGH (ref 0.50–1.35)
GFR calc Af Amer: 57 mL/min — ABNORMAL LOW (ref >90)
GFR calc non Af Amer: 49 mL/min — ABNORMAL LOW (ref >90)
Glucose, Bld: 141 mg/dL — ABNORMAL HIGH (ref 70–99)
Potassium: 4.3 mEq/L (ref 3.7–5.3)
SODIUM: 139 meq/L (ref 137–147)

## 2014-05-13 LAB — PRO B NATRIURETIC PEPTIDE: Pro B Natriuretic peptide (BNP): 1377 pg/mL — ABNORMAL HIGH (ref 0–125)

## 2014-05-13 MED ORDER — METOLAZONE 5 MG PO TABS: 5.0000 mg | ORAL_TABLET | ORAL | Status: DC

## 2014-05-13 NOTE — Telephone Encounter (Signed)
Lab results reviewed with patient, will follow up at next lab appointment,  aware and agreeable. Ave Filter

## 2014-05-13 NOTE — Patient Instructions (Signed)
Follow up next week  Take Metolazone 5 mg today and tomorrow.   Take an extra potassium today and tomorrow.

## 2014-05-14 ENCOUNTER — Telehealth: Payer: Self-pay

## 2014-05-14 NOTE — Telephone Encounter (Signed)
PT IS CALLING TO ASK WHY OUR DR WILL NOT AUTHORIZED REFILL FOR PREDNISONE PLEASE CALL PT TO ADVISE

## 2014-05-14 NOTE — Telephone Encounter (Signed)
PATIENT STATES HE WAS IN THE OFFICE TO SEE DR. COPLAND ON June 1ST, FOR GOUT IN HIS ELBOW AND TOE. SHE PRESCRIBED HIM PREDNISONE, BUT SHE ONLY GAVE HIM ABOUT 20 PILLS. HE IS OUT AND WANTS TO KNOW WHAT SHE WOULD LIKE HIM TO TAKE NOW? HE IS STILL IN PAIN. BEST PHONE [phone] (CELL)  PHARMACY CHOICE IS [facility] ON [address].  MBC

## 2014-05-14 NOTE — Telephone Encounter (Signed)
He needs to RTC 

## 2014-05-14 NOTE — Telephone Encounter (Signed)
See other note in file

## 2014-05-15 ENCOUNTER — Ambulatory Visit (HOSPITAL_COMMUNITY): Payer: Medicare HMO

## 2014-05-15 ENCOUNTER — Ambulatory Visit (INDEPENDENT_AMBULATORY_CARE_PROVIDER_SITE_OTHER): Payer: Commercial Managed Care - HMO | Admitting: Family Medicine

## 2014-05-15 VITALS — BP 132/84 | HR 80 | Temp 98.1°F | Resp 16 | Ht 70.0 in | Wt 229.4 lb

## 2014-05-15 DIAGNOSIS — M10421 Other secondary gout, right elbow: Secondary | ICD-10-CM

## 2014-05-15 DIAGNOSIS — G894 Chronic pain syndrome: Secondary | ICD-10-CM

## 2014-05-15 DIAGNOSIS — M109 Gout, unspecified: Secondary | ICD-10-CM

## 2014-05-15 MED ORDER — COLCHICINE 0.6 MG PO TABS: ORAL_TABLET | ORAL | Status: DC

## 2014-05-15 NOTE — Progress Notes (Signed)
[facility] [address] [place] [address] [phone]  Date:  05/15/2014   Name:  Frank Davidson   DOB:  1951-11-14   MRN:  [id number]  PCP:  Janell Quiet, FNP    Chief Complaint: Elbow Pain and Referral   History of Present Illness:  Frank Davidson is a 63 y.o. very pleasant male patient who presents with the following:  I saw him at the beginning of the month to follow-up his chronic HTN/ DM.  Also gave him some prednisone to use prn for gout.   He is still a patient at [facility], but needs a referral for insurance reasons.   He notes pain in his right elbow and in his right knee; this is intermittent and he thinks due to gout. He had been on colcrys but does not take this anymore.   He has noted possible gout pain off an on since he was here 3 weeks ago.  No known injury.  He does walk several miles a day despite having to use a cane or walker.  He has taken 40mg  prednisone for 2 or 3 days for gout flares, has done this 2 or 3 times.  However it does not seem to be helping as much as usual  He just saw his heart doctor- they increased his lasix.    Wt Readings from Last 3 Encounters:  05/15/14 229 lb 6.4 oz (104.055 kg)  05/13/14 235 lb 4 oz (106.709 kg)  04/29/14 234 lb 12.8 oz (106.505 kg)     Patient Active Problem List   Diagnosis Date Noted  . Gout 04/29/2014  . Decreased pedal pulses 04/19/2014  . Acute esophagitis 12/24/2013  . Dysphagia, unspecified(787.20) 10/01/2013  . Diarrhea 10/01/2013  . Acute on chronic systolic heart failure   . Acute CHF 09/04/2013  . Chest pain 09/04/2013  . Ocular herpes   . Neuropathy   . Left-sided weakness   . Acute on chronic diastolic CHF (congestive heart failure), NYHA class 3 07/09/2013  . Acute ischemic stroke 07/09/2013  . OSA (obstructive sleep apnea) 07/09/2013  . Gout attack 07/07/2013  . Chronic systolic heart failure 07/05/2013  . Stroke 07/03/2013  . CVA  (cerebral infarction) 07/03/2013  . A-fib 07/03/2013  . Hypokalemia 07/03/2013  . Hypertension   . COPD (chronic obstructive pulmonary disease)   . Diabetes mellitus without complication   . CKD (chronic kidney disease), stage III   . CHF (congestive heart failure)   . Nonischemic cardiomyopathy   . Polysubstance abuse     Past Medical History  Diagnosis Date  . Fibromyalgia   . Atrial fibrillation   . Hypertension   . Osteoarthritis   . COPD (chronic obstructive pulmonary disease)   . Diabetes mellitus without complication   . Coronary artery disease   . Neuropathy   . Ocular herpes   . Gout   . CKD (chronic kidney disease), stage III   . CHF (congestive heart failure)     EF 15%  . Chronic pain   . Hyperlipidemia   . Nonischemic cardiomyopathy   . Ocular herpes   . Neuropathy   . Left-sided weakness   . Acute on chronic systolic heart failure   . Stroke     x 5. last stroke aug 2014  . Sleep apnea with use of continuous positive airway pressure (CPAP)     does not use cpap  . Decreased pedal pulses  Past Surgical History  Procedure Laterality Date  . 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 Cardiac catheterization  10/2006    normal coronary arteries  . Tonsillectomy  age 61  . Esophagogastroduodenoscopy N/A 12/24/2013    Procedure: ESOPHAGOGASTRODUODENOSCOPY (EGD);  Surgeon: Louis Meckel, MD;  Location: [facility];  Service: Endoscopy;  Laterality: N/A;  . Colonoscopy N/A 12/24/2013    Procedure: COLONOSCOPY;  Surgeon: Louis Meckel, MD;  Location: [facility];  Service: Endoscopy;  Laterality: N/A;    History  Substance Use Topics  . Smoking status: Former Smoker -- 0.50 packs/day for 10 years    Types: Cigarettes    Quit date: 12/18/2006  . Smokeless tobacco: Never Used  . Alcohol Use: No    Family History  Problem Relation Age of Onset  . Cancer Mother   . Hypertension Mother   . Lung disease Mother   . Diabetes Father   . Diabetes Sister    . Hypertension Sister   . Hypertension Brother   . Hypertension Sister   . Colon cancer Neg Hx     Allergies  Allergen Reactions  . Actos [Pioglitazone] Other (See Comments)    Caused blood in urine and fluid retention   . Januvia [Sitagliptin]     Medication list has been reviewed and updated.  Current Outpatient Prescriptions on File Prior to Visit  Medication Sig Dispense Refill  . allopurinol (ZYLOPRIM) 100 MG tablet Take 100 mg by mouth daily with breakfast.      . apixaban (ELIQUIS) 5 MG TABS tablet Take 5 mg by mouth 2 (two) times daily.      . calcium carbonate (TUMS - DOSED IN MG ELEMENTAL CALCIUM) 500 MG chewable tablet Chew 1 tablet by mouth 3 (three) times daily.      . carvedilol (COREG) 25 MG tablet Take 25 mg by mouth 2 (two) times daily with a meal.      . cyclobenzaprine (FLEXERIL) 5 MG tablet Take 1/2-1 tab po nightly for pain/muscle spasms  30 tablet  0  . ELIQUIS 5 MG TABS tablet TAKE 1 TABLET (5 MG TOTAL) BY MOUTH 2 (TWO) TIMES DAILY. * INS WILL PAY 12/17*  60 tablet  5  . FLECTOR 1.3 % PTCH Place 0.5 patches onto the skin as needed.      . furosemide (LASIX) 80 MG tablet Take 1 tablet (80 mg total) by mouth 2 (two) times daily.  60 tablet  6  . glipiZIDE (GLUCOTROL) 5 MG tablet Take 5 mg by mouth 2 (two) times daily before a meal.      . hydrALAZINE (APRESOLINE) 50 MG tablet Take 1 tablet (50 mg total) by mouth 2 (two) times daily.  60 tablet  3  . HYDROcodone-acetaminophen (NORCO) 10-325 MG per tablet Take 1 tablet by mouth 2 (two) times daily as needed.      . isosorbide mononitrate (IMDUR) 60 MG 24 hr tablet Take 1 tablet (60 mg total) by mouth daily.  30 tablet  3  . lisinopril (PRINIVIL,ZESTRIL) 2.5 MG tablet Take 2.5 mg by mouth 2 (two) times daily.      . metolazone (ZAROXOLYN) 5 MG tablet Take 1 tablet (5 mg total) by mouth as directed.  12 tablet  3  . pantoprazole (PROTONIX) 40 MG tablet TAKE 1 TABLET (40 MG TOTAL) BY MOUTH 2 (TWO) TIMES DAILY.  60  tablet  2  . potassium chloride SA (K-DUR,KLOR-CON) 20 MEQ tablet Take 1 tablet (  20 mEq total) by mouth daily.  30 tablet  6  . predniSONE (DELTASONE) 20 MG tablet Take 1 pill daily for 3-5 days as needed for gout attack  20 tablet  0  . spironolactone (ALDACTONE) 25 MG tablet TAKE 0.5 TABLETS (12.5 MG TOTAL) BY MOUTH DAILY.  15 tablet  3  . LYRICA 150 MG capsule Take 1 capsule by mouth as needed.       No current facility-administered medications on file prior to visit.    Review of Systems:  As per HPI- otherwise negative. Creat clearance 125  Physical Examination: Filed Vitals:   05/15/14 1545  BP: 132/84  Pulse: 80  Temp: 98.1 F (36.7 C)  Resp: 16   Filed Vitals:   05/15/14 1545  Height: 5\' 10"  (1.778 m)  Weight: 229 lb 6.4 oz (104.055 kg)   Body mass index is 32.92 kg/(m^2). Ideal Body Weight: Weight in (lb) to have BMI = 25: 173.9  GEN: WDWN, NAD, Non-toxic, A & O x 3, obese, looks well HEENT: Atraumatic, Normocephalic. Neck supple. No masses, No LAD. Ears and Nose: No external deformity. CV: RRR, No M/G/R. No JVD. No thrill. No extra heart sounds. PULM: CTA B, no wheezes, crackles, rhonchi. No retractions. No resp. distress. No accessory muscle use. EXTR: No c/c/e NEURO Normal gait.  PSYCH: Normally interactive. Conversant. Not depressed or anxious appearing.  Calm demeanor.  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  otherwise no heat, redness or swelling.  Full ROM to F/E/P/S. Right knee: mild tenderness with ROM but no heat/ effusion/ redness.  Joint is stable  Assessment and Plan: Chronic pain syndrome - Plan: Ambulatory referral to Pain Clinic  Other secondary gout of right elbow - Plan: colchicine 0.6 MG tablet  Placed referral to her current pain provider as per his request.  He is not having as much success as with in the past with occasional prednisone use.  He would like to try some colchicine which he has worked well  before. He may take 1.2 mg per attack as he is also on cozaar He will let me know if not feeling better- Sooner if worse.     Signed Abbe AmsterdamJessica Copland, MD

## 2014-05-15 NOTE — Telephone Encounter (Signed)
Spoke to patient and advised him to RTC to be seen.  He said he would do so.

## 2014-05-15 NOTE — Patient Instructions (Signed)
You can take the colcrys (2 pills) as needed for gout.  Do not take more than 2 pills every 3 days

## 2014-05-17 ENCOUNTER — Ambulatory Visit (HOSPITAL_COMMUNITY): Payer: Medicare HMO

## 2014-05-21 ENCOUNTER — Telehealth: Payer: Self-pay

## 2014-05-21 ENCOUNTER — Ambulatory Visit (HOSPITAL_COMMUNITY)
Admission: RE | Admit: 2014-05-21 | Discharge: 2014-05-21 | Disposition: A | Discharge status: Home / Self Care | Payer: Medicare HMO | Source: Ambulatory Visit | Attending: Internal Medicine | Admitting: Internal Medicine

## 2014-05-21 ENCOUNTER — Encounter (HOSPITAL_COMMUNITY): Payer: Self-pay

## 2014-05-21 VITALS — BP 111/64 | HR 62 | Resp 18 | Wt 235.1 lb

## 2014-05-21 DIAGNOSIS — N183 Chronic kidney disease, stage 3 unspecified: Secondary | ICD-10-CM

## 2014-05-21 DIAGNOSIS — G894 Chronic pain syndrome: Secondary | ICD-10-CM

## 2014-05-21 DIAGNOSIS — G4730 Sleep apnea, unspecified: Secondary | ICD-10-CM | POA: Insufficient documentation

## 2014-05-21 DIAGNOSIS — M109 Gout, unspecified: Secondary | ICD-10-CM | POA: Insufficient documentation

## 2014-05-21 DIAGNOSIS — I5022 Chronic systolic (congestive) heart failure: Secondary | ICD-10-CM

## 2014-05-21 DIAGNOSIS — J449 Chronic obstructive pulmonary disease, unspecified: Secondary | ICD-10-CM | POA: Insufficient documentation

## 2014-05-21 DIAGNOSIS — E119 Type 2 diabetes mellitus without complications: Secondary | ICD-10-CM | POA: Insufficient documentation

## 2014-05-21 DIAGNOSIS — I10 Essential (primary) hypertension: Secondary | ICD-10-CM

## 2014-05-21 DIAGNOSIS — E785 Hyperlipidemia, unspecified: Secondary | ICD-10-CM | POA: Insufficient documentation

## 2014-05-21 DIAGNOSIS — I2510 Atherosclerotic heart disease of native coronary artery without angina pectoris: Secondary | ICD-10-CM | POA: Insufficient documentation

## 2014-05-21 DIAGNOSIS — I428 Other cardiomyopathies: Secondary | ICD-10-CM

## 2014-05-21 DIAGNOSIS — I129 Hypertensive chronic kidney disease with stage 1 through stage 4 chronic kidney disease, or unspecified chronic kidney disease: Secondary | ICD-10-CM | POA: Insufficient documentation

## 2014-05-21 DIAGNOSIS — Z8673 Personal history of transient ischemic attack (TIA), and cerebral infarction without residual deficits: Secondary | ICD-10-CM | POA: Insufficient documentation

## 2014-05-21 DIAGNOSIS — J4489 Other specified chronic obstructive pulmonary disease: Secondary | ICD-10-CM | POA: Insufficient documentation

## 2014-05-21 DIAGNOSIS — I480 Paroxysmal atrial fibrillation: Secondary | ICD-10-CM

## 2014-05-21 DIAGNOSIS — I509 Heart failure, unspecified: Secondary | ICD-10-CM | POA: Insufficient documentation

## 2014-05-21 DIAGNOSIS — I4891 Unspecified atrial fibrillation: Secondary | ICD-10-CM | POA: Insufficient documentation

## 2014-05-21 MED ORDER — SPIRONOLACTONE 25 MG PO TABS: 25.0000 mg | ORAL_TABLET | Freq: Every day | ORAL | Status: DC

## 2014-05-21 NOTE — Patient Instructions (Signed)
Continue to watch your weight and call if your weight starts trending up more.  Increase your spironolactone to 25 mg (1 tablet) daily.  Come by next Friday for lab work.  Follow up in 6 weeks  Do the following things EVERYDAY: 1) Weigh yourself in the morning before breakfast. Write it down and keep it in a log. 2) Take your medicines as prescribed 3) Eat low salt foods-Limit salt (sodium) to 2000 mg per day.  4) Stay as active as you can everyday 5) Limit all fluids for the day to less than 2 liters 6)

## 2014-05-21 NOTE — Telephone Encounter (Signed)
PATIENT IS REQUESTING  [facility]ERR[facility] @ [phone]  [phone]   DR Patsy Lager

## 2014-05-21 NOTE — Progress Notes (Signed)
Patient ID: Frank Davidson, male   DOB: 1951-08-18, 63 y.o.   MRN: [id number]  PCP: Roxy Cedar FNP GI: Dr Reed Breech Rheumatologist:   HPI: Frank Davidson is a 63 yo male with a hx of obesity, PAF, HTN, COPD, HTN, gout, DM 2, CVA and CHF due to NICM.  Has previously followed in the [facility] in [place]. Previous cath in 2007 showed NICM with normal cors. Was living alone in [place] and apparently had issues managing medications by himself and was getting confused with PCP and cardiologists medications and was not taking his meds correctly. He moved to [place] to be near his nieces. Issues in the past with ACE-I due to renal failure. Highest creatinine in our system is ~1.5 but was apparently higher previously - thought to be related to taking his medications incorrectly.   Was admitted in 06/2013 from [facility] with left sided weakness and was found to have a moderate to large right hemispheric infarct. Went back to [facility].   Admitted to [facility] 10/14 through 09/09/13 with ADHF. Diuresed 30 pounds with IV lasix and transitioned to lasix 80 mg po bid. Continued on 25 mg carvedilol bid, isordil 20 mg daily.  Started on lisinopril 2.5 mg daily. Discharge weight 229 pounds. 09/29/13 CT abdomen and pelvis - no obstruction . Diverticulosis. No inflammatory changes.   Follow up for Heart Failure: Last visit volume status elevated and increased lasix to 80 mg BID for 2 days with metolazone 5 mg for two days. Weight is up more from last week. Took metolazone again yesterday. Has joint pain. Mild dyspnea with walking can walk about a mile before stopping. Denies PND, orthopnea, CP or edema. Following a low salt diet and drinking less than 2L a day.    ECHO 11/08/13 EF 15%   Labs 09/07/13 K 3.9 Creatinine 1.18  Labs 09/17/13 dig level 1.7 K 4.4 Creatinine 1.41 Pro BNP 2256 Labs 09/25/13 K 6.1 Creatinine 2.57  Labs 09/28/13  K 5.3 Creatinine 1.98  Labs 10/01/13 Dig level 2.5 --->dig  stopped Labs 10/05/13 K 4.8 Creatinine 1.79 Labs 11/01/13 K 3.3 --Supplemented Creatinine 1.3 Uric Acid 9.3 AST 23 ALT 12 Hemoglobin 13 Hemoglobin 7.2  Labs 11/21/13 K 4.4 Creatinine 1.3            01/02/14 K 4.2, Creatinine 1.1            01/10/14 K 4.6, Creatinine 1.28           04/22/14 K 4.0 Creatinine 1.58         05/13/14 K 4.3, creatinine 1.46, pro-BNP 1377  SH: Retired Education officer, [employer] 2011 Lives with his niece  FH: Father DM  Mom had lung cancer  ROS: All systems negative except as listed in HPI, PMH and Problem List.  Past Medical History  Diagnosis Date  . Fibromyalgia   . Atrial fibrillation   . Hypertension   . Osteoarthritis   . COPD (chronic obstructive pulmonary disease)   . Diabetes mellitus without complication   . Coronary artery disease   . Neuropathy   . Ocular herpes   . Gout   . CKD (chronic kidney disease), stage III   . CHF (congestive heart failure)     EF 15%  . Chronic pain   . Hyperlipidemia   . Nonischemic cardiomyopathy   . Ocular herpes   . Neuropathy   . Left-sided weakness   . Acute on chronic systolic heart failure   . Stroke  x 5. last stroke aug 2014  . Sleep apnea with use of continuous positive airway pressure (CPAP)     does not use cpap  . Decreased pedal pulses     Current Outpatient Prescriptions  Medication Sig Dispense Refill  . allopurinol (ZYLOPRIM) 100 MG tablet Take 100 mg by mouth daily with breakfast.      . apixaban (ELIQUIS) 5 MG TABS tablet Take 5 mg by mouth 2 (two) times daily.      . calcium carbonate (TUMS - DOSED IN MG ELEMENTAL CALCIUM) 500 MG chewable tablet Chew 1 tablet by mouth 3 (three) times daily.      . carvedilol (COREG) 25 MG tablet Take 25 mg by mouth 2 (two) times daily with a meal.      . colchicine 0.6 MG tablet Take 2 pills (1.69m) once as needed for gout flare.  30 tablet  0  . cyclobenzaprine (FLEXERIL) 5 MG tablet Take 1/2-1 tab po nightly for pain/muscle spasms  30 tablet  0  . ELIQUIS 5 MG  TABS tablet TAKE 1 TABLET (5 MG TOTAL) BY MOUTH 2 (TWO) TIMES DAILY. * INS WILL PAY 12/17*  60 tablet  5  . FLECTOR 1.3 % PTCH Place 0.5 patches onto the skin as needed.      . furosemide (LASIX) 80 MG tablet Take 1 tablet (80 mg total) by mouth 2 (two) times daily.  60 tablet  6  . glipiZIDE (GLUCOTROL) 5 MG tablet Take 5 mg by mouth 2 (two) times daily before a meal.      . hydrALAZINE (APRESOLINE) 50 MG tablet Take 1 tablet (50 mg total) by mouth 2 (two) times daily.  60 tablet  3  . HYDROcodone-acetaminophen (NORCO) 10-325 MG per tablet Take 1 tablet by mouth 2 (two) times daily as needed.      . isosorbide mononitrate (IMDUR) 60 MG 24 hr tablet Take 1 tablet (60 mg total) by mouth daily.  30 tablet  3  . lisinopril (PRINIVIL,ZESTRIL) 2.5 MG tablet Take 2.5 mg by mouth 2 (two) times daily.      .Marland KitchenLYRICA 150 MG capsule Take 1 capsule by mouth as needed.      . metolazone (ZAROXOLYN) 5 MG tablet Take 5 mg by mouth as needed.      . pantoprazole (PROTONIX) 40 MG tablet TAKE 1 TABLET (40 MG TOTAL) BY MOUTH 2 (TWO) TIMES DAILY.  60 tablet  2  . potassium chloride SA (K-DUR,KLOR-CON) 20 MEQ tablet Take 1 tablet (20 mEq total) by mouth daily.  30 tablet  6  . predniSONE (DELTASONE) 20 MG tablet Take 1 pill daily for 3-5 days as needed for gout attack  20 tablet  0  . spironolactone (ALDACTONE) 25 MG tablet TAKE 0.5 TABLETS (12.5 MG TOTAL) BY MOUTH DAILY.  15 tablet  3   No current facility-administered medications for this encounter.    Filed Vitals:   05/21/14 1058  BP: 111/64  Pulse: 62  Resp: 18  Weight: 235 lb 2 oz (106.652 kg)  SpO2: 98%   PHYSICAL EXAM: General:  Obese male;  Ambulated into clinic with a walker HEENT: normal Neck: supple. JVP does not appear elevated ~7-8.  Carotids 2+ bilaterally; no bruits. No lymphadenopathy or thryomegaly appreciated. Cor: PMI normal. Regular rate & rhythm. No rubs, gallops or murmurs. Lungs: clear Abdomen: obese soft, nontender, + distended.  No hepatosplenomegaly. No bruits or masses. Good bowel sounds. Extremities: no cyanosis, clubbing, rash,  edema.  Neuro: alert & orientedx3, cranial nerves grossly intact. Affect pleasant.  ASSESSMENT & PLAN:  1) Chronic Systolic Heart Failure: NICM, EF 15-20% (10/2013) NYHA II.   - NYHA II symptoms and volume status stable. His weight is up another 6 lbs from last week, however patient does not appear to be volume overloaded. Last pro-BNP was slightly elevated. Will continue lasix 80 mg BID and metolazone 5 mg PRN. Discussed with the patient to call if weight continues to trend up or if he has increased SOB. - Coreg at goal dose 25 mg BID. - Continue lisinopril 2.5 mg BID and increase Spiro to 25 mg daily. Check BMET today and in 7-10 days. - Continue hydralazine 50 mg TID and Imdur 60 mg daily for afterload reduction.  - Not currently interested in ICD, he has met with Dr. Lovena Le. He understands the risks of SCD. We discussed the use of cardiomems device today to help with volume management, however patient not interested.  - Reinforced the need and importance of daily weights, a low sodium diet and to avoid soups, and fluid restriction (less than 2 L a day). Instructed to call the HF clinic if weight increases more than 3 lbs overnight or 5 lbs in a week.  2) Gout - Continue allopurinol. Continue to follow with rheumatology. Has pain management clinic appt tomorrow.  3) Atrial fibrillation-  Appears to be in NSR today. Continue BB and Eliquis. No s/s of bleeding.  4) Chronic renal failure, stage II-III - baseline Cr 1.46-1.8. Check BMET today.     F/U 6 weeks  Junie Bame B  NP-C 11:01 AM

## 2014-05-24 ENCOUNTER — Telehealth: Payer: Self-pay

## 2014-05-24 DIAGNOSIS — G894 Chronic pain syndrome: Secondary | ICD-10-CM

## 2014-05-24 MED ORDER — HYDROCODONE-ACETAMINOPHEN 10-325 MG PO TABS: 1.0000 | ORAL_TABLET | Freq: Three times a day (TID) | ORAL | Status: DC | PRN

## 2014-05-24 NOTE — Telephone Encounter (Addendum)
Pt called in and states his appt with pain management was cancel due to needing another referral so he wanted to know if Dr Patsy Lager could give him a script for Tramadol he says he takes oxycotin. He uses [facility] on [address]..he can be reached [phone] thank you

## 2014-05-24 NOTE — Telephone Encounter (Signed)
Pt called in and states his appt with pain management was cancel due to needing another referral so he wanted to know if Dr Copland could give him a script for Tramadol he says he takes oxycotin. [facility] uses [facility] on [address]he can be reached [phone] thank you °

## 2014-05-24 NOTE — Telephone Encounter (Signed)
Called him back.  He states he was not able to make his pain clinic appt.  He takes hydrocodone 10 TID- he has a new pain appt in 2 weeks.  I will give him a 2 week supply of hydrocodone which he will pick up tomorrow

## 2014-05-31 ENCOUNTER — Other Ambulatory Visit (HOSPITAL_COMMUNITY): Payer: Commercial Managed Care - HMO

## 2014-06-01 ENCOUNTER — Other Ambulatory Visit: Payer: Self-pay | Admitting: Family Medicine

## 2014-06-03 ENCOUNTER — Telehealth (HOSPITAL_COMMUNITY): Payer: Self-pay

## 2014-06-03 NOTE — Telephone Encounter (Signed)
Lab results reviewed with patient, instructed to hold lasix x 2 days, then resume as prescribed.  Will come back in 2 weeks per guide it research schedule to have labs redrawn. [facility]

## 2014-06-06 ENCOUNTER — Encounter: Payer: Self-pay | Admitting: Internal Medicine

## 2014-06-07 ENCOUNTER — Telehealth: Payer: Self-pay | Admitting: *Deleted

## 2014-06-07 DIAGNOSIS — M7070 Other bursitis of hip, unspecified hip: Secondary | ICD-10-CM

## 2014-06-07 NOTE — Telephone Encounter (Signed)
Referral entered for [facility] consult for a hip replacement in March (referral states pt did not need prior auth)- appt is today and pt needs a referral and prior auth Pt is in office today for a 930 appt.  [employer]- pt needs authorization- Original referral entered by [facility].  I have entered in referral to get working on this for pt- please advise Dr Patsy Lager that you agree with referral. You are listed as Pt PCP.   [phone]- [facility] when approval is in.

## 2014-06-07 NOTE — Telephone Encounter (Signed)
I just signed order

## 2014-06-10 ENCOUNTER — Telehealth: Payer: Self-pay

## 2014-06-10 ENCOUNTER — Other Ambulatory Visit (HOSPITAL_COMMUNITY): Payer: Self-pay | Admitting: Anesthesiology

## 2014-06-10 NOTE — Telephone Encounter (Signed)
Referrals has started the [facility] authorization and kim is aware that it is pending and will call her with authorization number once receieved

## 2014-06-10 NOTE — Telephone Encounter (Signed)
Patient states his orthopaedic doctor recommended he have hip replacement surgery. Patient calling to let Dr. Patsy Lager voice her opinion on his advice. Please return call at [phone]. Thank you.

## 2014-06-12 NOTE — Telephone Encounter (Signed)
PT CALLED AGAIN AND WOULD LIKE TO SPEAK WITH DR COPLAND ABOUT A SECOND OPINION, IS TO HAVE A HIP REPLACEMENT AND WANTED HER ADVICE PLEASE CALL [phone]

## 2014-06-12 NOTE — Telephone Encounter (Signed)
Called him back as he requested.  Per his ortho note is does sound like a hip replacement would be helpful for his quality of life.

## 2014-06-19 ENCOUNTER — Telehealth: Payer: Self-pay | Admitting: Family Medicine

## 2014-06-19 NOTE — Telephone Encounter (Signed)
Called and LMOM.  He needs a pre- op exam.  Please come and see me- also discuss with his cardiologist

## 2014-06-22 DIAGNOSIS — I428 Other cardiomyopathies: Secondary | ICD-10-CM

## 2014-06-22 HISTORY — DX: Other cardiomyopathies: I42.8

## 2014-06-24 ENCOUNTER — Ambulatory Visit (INDEPENDENT_AMBULATORY_CARE_PROVIDER_SITE_OTHER): Payer: Commercial Managed Care - HMO | Admitting: Family Medicine

## 2014-06-24 VITALS — BP 120/80 | HR 64 | Temp 98.4°F | Resp 16

## 2014-06-24 DIAGNOSIS — E118 Type 2 diabetes mellitus with unspecified complications: Secondary | ICD-10-CM

## 2014-06-24 DIAGNOSIS — IMO0002 Reserved for concepts with insufficient information to code with codable children: Secondary | ICD-10-CM

## 2014-06-24 DIAGNOSIS — E1165 Type 2 diabetes mellitus with hyperglycemia: Secondary | ICD-10-CM

## 2014-06-24 DIAGNOSIS — Z01818 Encounter for other preprocedural examination: Secondary | ICD-10-CM

## 2014-06-24 DIAGNOSIS — I5022 Chronic systolic (congestive) heart failure: Secondary | ICD-10-CM

## 2014-06-24 DIAGNOSIS — I509 Heart failure, unspecified: Secondary | ICD-10-CM

## 2014-06-24 LAB — CBC WITH DIFFERENTIAL/PLATELET
BASOS ABS: 0.1 10*3/uL (ref 0.0–0.1)
Basophils Relative: 1 % (ref 0–1)
EOS PCT: 2 % (ref 0–5)
Eosinophils Absolute: 0.2 10*3/uL (ref 0.0–0.7)
HEMATOCRIT: 34.5 % — AB (ref 39.0–52.0)
HEMOGLOBIN: 11.5 g/dL — AB (ref 13.0–17.0)
LYMPHS PCT: 23 % (ref 12–46)
Lymphs Abs: 1.9 10*3/uL (ref 0.7–4.0)
MCH: 28.5 pg (ref 26.0–34.0)
MCHC: 33.3 g/dL (ref 30.0–36.0)
MCV: 85.6 fL (ref 78.0–100.0)
MONO ABS: 0.8 10*3/uL (ref 0.1–1.0)
MONOS PCT: 9 % (ref 3–12)
Neutro Abs: 5.5 10*3/uL (ref 1.7–7.7)
Neutrophils Relative %: 65 % (ref 43–77)
Platelets: 202 10*3/uL (ref 150–400)
RBC: 4.03 MIL/uL — AB (ref 4.22–5.81)
RDW: 14.6 % (ref 11.5–15.5)
WBC: 8.4 10*3/uL (ref 4.0–10.5)

## 2014-06-24 NOTE — Patient Instructions (Signed)
I will await word from Dr. Gala Romney and will let you know how your labs look.  Take care and I will be in touch soon

## 2014-06-24 NOTE — Progress Notes (Signed)
[facility] [address], [place] [id number] [phone]  Date:  06/24/2014   Name:  Frank Davidson   DOB:  [birth year]   MRN:  [id number]  PCP:  Janell QuietAMPBELL, PADONDA BOYD, FNP    Chief Complaint: Medical Clearance   History of Present Illness:  Frank Davidson is a 63 y.o. very pleasant male patient who presents with the following:  Here today to discuss possible surgery for a left hip replacement.  He does have multiple medical problems as below. His EF is 15%.   He had been walking up to 4 miles a day with his walker up until about 6 weeks ago when his hip started to get worse.  He is now walking just about 1/3 of a mild using a cane.   He has had left hip pain for several years, worse over the last several months.  It is currenly really affecting his ability to walk, sleep and do his activities.    He also had ?some strokes last fall.  He never noted any sx in particular but was told that a scan showed "that I had about 5 strokes." MRI 06/2013: IMPRESSION:  Motion degraded exam limited to diffusion imaging and sagittal  axial T1 sequence.  Moderate to large-sized right hemispheric acute infarct extends  from the right opercular/sub insular region into the right frontal  lobe bordering the anterior aspect of the right parietal lobe.  No obvious intracranial hemorrhage although gradient sequence not  obtained.  Remote left parietal lobe infarct encephalomalacia. Remote  posterior left lenticular nucleus/posterior limb left internal  capsule infarct.  Small vessel disease type changes.  Atrophy without hydrocephalus.  MRA HEAD  Findings: Exam significantly limited by motion. Grading stenosis  or evaluating for aneurysm is limited.  On this limited examination, significant high-grade tandem stenosis  suspected involving portions of the vertebral arteries and basilar  artery.  Flow is noted within the internal carotid arteries with suggestion  of  moderate narrowing of the pre cavernous and cavernous segment of  the left internal carotid artery with mild narrowing and ectasia of  the supraclinoid aspect of the internal carotid artery bilaterally.  Limited evaluation of branch vessels with suggestion of significant  intracranial atherosclerotic type changes.  Aneurysm cannot be excluded or confirmed on the present exam.    Patient Active Problem List   Diagnosis Date Noted  . Gout 04/29/2014  . Decreased pedal pulses 04/19/2014  . Acute esophagitis 12/24/2013  . Dysphagia, unspecified(787.20) 10/01/2013  . Diarrhea 10/01/2013  . Acute on chronic systolic heart failure   . Acute CHF 09/04/2013  . Chest pain 09/04/2013  . Ocular herpes   . Neuropathy   . Left-sided weakness   . Acute on chronic diastolic CHF (congestive heart failure), NYHA class 3 07/09/2013  . Acute ischemic stroke 07/09/2013  . OSA (obstructive sleep apnea) 07/09/2013  . Gout attack 07/07/2013  . Chronic systolic heart failure 07/05/2013  . Stroke 07/03/2013  . CVA (cerebral infarction) 07/03/2013  . A-fib 07/03/2013  . Hypokalemia 07/03/2013  . Hypertension   . COPD (chronic obstructive pulmonary disease)   . Diabetes mellitus without complication   . CKD (chronic kidney disease), stage III   . CHF (congestive heart failure)   . Nonischemic cardiomyopathy   . Polysubstance abuse     Past Medical History  Diagnosis Date  . Fibromyalgia   . Atrial fibrillation   . Hypertension   . Osteoarthritis   .  COPD (chronic obstructive pulmonary disease)   . Diabetes mellitus without complication   . Coronary artery disease   . Neuropathy   . Ocular herpes   . Gout   . CKD (chronic kidney disease), stage III   . CHF (congestive heart failure)     EF 15%  . Chronic pain   . Hyperlipidemia   . Nonischemic cardiomyopathy   . Ocular herpes   . Neuropathy   . Left-sided weakness   . Acute on chronic systolic heart failure   . Stroke     x 5. last  stroke aug 2014  . Sleep apnea with use of continuous positive airway pressure (CPAP)     does not use cpap  . Decreased pedal pulses     Past Surgical History  Procedure Laterality Date  . 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 Cardiac catheterization  10/2006    normal coronary arteries  . Tonsillectomy  age 49  . Esophagogastroduodenoscopy N/A 12/24/2013    Procedure: ESOPHAGOGASTRODUODENOSCOPY (EGD);  Surgeon: Louis Meckel, MD;  Location: [facility];  Service: Endoscopy;  Laterality: N/A;  . Colonoscopy N/A 12/24/2013    Procedure: COLONOSCOPY;  Surgeon: Louis Meckel, MD;  Location: [facility];  Service: Endoscopy;  Laterality: N/A;    History  Substance Use Topics  . Smoking status: Former Smoker -- 0.50 packs/day for 10 years    Types: Cigarettes    Quit date: 12/18/2006  . Smokeless tobacco: Never Used  . Alcohol Use: No    Family History  Problem Relation Age of Onset  . Cancer Mother   . Hypertension Mother   . Lung disease Mother   . Diabetes Father   . Diabetes Sister   . Hypertension Sister   . Hypertension Brother   . Hypertension Sister   . Colon cancer Neg Hx     Allergies  Allergen Reactions  . Actos [Pioglitazone] Other (See Comments)    Caused blood in urine and fluid retention   . Januvia [Sitagliptin]     Medication list has been reviewed and updated.  Current Outpatient Prescriptions on File Prior to Visit  Medication Sig Dispense Refill  . allopurinol (ZYLOPRIM) 100 MG tablet TAKE 1 TABLET (100 MG TOTAL) BY MOUTH DAILY.  30 tablet  6  . apixaban (ELIQUIS) 5 MG TABS tablet Take 5 mg by mouth 2 (two) times daily.      . calcium carbonate (TUMS - DOSED IN MG ELEMENTAL CALCIUM) 500 MG chewable tablet Chew 1 tablet by mouth 3 (three) times daily.      . carvedilol (COREG) 25 MG tablet Take 25 mg by mouth 2 (two) times daily with a meal.      . colchicine 0.6 MG tablet Take 2 pills (1.2mg ) once as needed for gout flare.  30 tablet  0  .  cyclobenzaprine (FLEXERIL) 5 MG tablet Take 1/2-1 tab po nightly for pain/muscle spasms  30 tablet  0  . ELIQUIS 5 MG TABS tablet TAKE 1 TABLET (5 MG TOTAL) BY MOUTH 2 (TWO) TIMES DAILY. * INS WILL PAY 12/17*  60 tablet  5  . FLECTOR 1.3 % PTCH Place 0.5 patches onto the skin as needed.      . furosemide (LASIX) 80 MG tablet Take 1 tablet (80 mg total) by mouth 2 (two) times daily.  60 tablet  6  . glipiZIDE (GLUCOTROL) 5 MG tablet Take 5 mg by mouth 2 (two) times daily before  a meal.      . hydrALAZINE (APRESOLINE) 50 MG tablet TAKE 1 TABLET (50 MG TOTAL) BY MOUTH 2 (TWO) TIMES DAILY.  60 tablet  3  . HYDROcodone-acetaminophen (NORCO) 10-325 MG per tablet Take 1 tablet by mouth 3 (three) times daily as needed.  42 tablet  0  . isosorbide mononitrate (IMDUR) 60 MG 24 hr tablet TAKE 1 TABLET (60 MG TOTAL) BY MOUTH DAILY.  30 tablet  3  . lisinopril (PRINIVIL,ZESTRIL) 2.5 MG tablet Take 2.5 mg by mouth 2 (two) times daily.      Marland Kitchen LYRICA 150 MG capsule Take 1 capsule by mouth as needed.      . metolazone (ZAROXOLYN) 5 MG tablet Take 5 mg by mouth as needed.      . pantoprazole (PROTONIX) 40 MG tablet TAKE 1 TABLET (40 MG TOTAL) BY MOUTH 2 (TWO) TIMES DAILY.  60 tablet  2  . potassium chloride SA (K-DUR,KLOR-CON) 20 MEQ tablet Take 1 tablet (20 mEq total) by mouth daily.  30 tablet  6  . spironolactone (ALDACTONE) 25 MG tablet Take 1 tablet (25 mg total) by mouth daily.  30 tablet  3  . allopurinol (ZYLOPRIM) 100 MG tablet Take 100 mg by mouth daily with breakfast.       No current facility-administered medications on file prior to visit.    Review of Systems:  As per HPI- otherwise negative.   Physical Examination: Filed Vitals:   06/24/14 1600  BP: 120/80  Pulse: 64  Temp: 98.4 F (36.9 C)  Resp: 16   There were no vitals filed for this visit. There is no weight on file to calculate BMI. Ideal Body Weight:    GEN: WDWN, NAD, Non-toxic, A & O x 3, obese HEENT: Atraumatic,  Normocephalic. Neck supple. No masses, No LAD. Ears and Nose: No external deformity. CV: RRR, No M/G/R. No JVD. No thrill. No extra heart sounds. PULM: CTA B, no wheezes, crackles, rhonchi. No retractions. No resp. distress. No accessory muscle use. ABD: S, NT, ND, +BS. No rebound. No HSM. EXTR: No c/c/e NEURO slow gait, uses cane and clearly favors hip PSYCH: Normally interactive. Conversant. Not depressed or anxious appearing.  Calm demeanor.    Assessment and Plan: Pre-operative clearance - Plan: CBC with Differential, Comprehensive metabolic panel  Chronic systolic congestive heart failure  Type II or unspecified type diabetes mellitus with unspecified complication, uncontrolled - Plan: Hemoglobin A1c  Beulah is here today to discuss pre- operative clearance for a left total hip.  His hip is clearly painful and I do think this surgery will help him. However his operative risk is a concern. He has a history of CVA and significant CHF.  He will see his cardiologist Dr. Gala Romney in the next week or so.  If cleared from his standpoint consider a neurology consultation as well prior to final clearance   Signed Abbe Amsterdam, MD

## 2014-06-25 ENCOUNTER — Encounter: Payer: Self-pay | Admitting: Internal Medicine

## 2014-06-25 LAB — COMPREHENSIVE METABOLIC PANEL
ALBUMIN: 4.4 g/dL (ref 3.5–5.2)
ALT: 16 U/L (ref 0–53)
AST: 24 U/L (ref 0–37)
Alkaline Phosphatase: 88 U/L (ref 39–117)
BUN: 22 mg/dL (ref 6–23)
CHLORIDE: 103 meq/L (ref 96–112)
CO2: 27 meq/L (ref 19–32)
Calcium: 9.6 mg/dL (ref 8.4–10.5)
Creat: 1.49 mg/dL — ABNORMAL HIGH (ref 0.50–1.35)
GLUCOSE: 83 mg/dL (ref 70–99)
POTASSIUM: 3.9 meq/L (ref 3.5–5.3)
Sodium: 139 mEq/L (ref 135–145)
Total Bilirubin: 0.4 mg/dL (ref 0.2–1.2)
Total Protein: 7.1 g/dL (ref 6.0–8.3)

## 2014-06-25 LAB — HEMOGLOBIN A1C
HEMOGLOBIN A1C: 5.8 % — AB (ref <5.7)
Mean Plasma Glucose: 120 mg/dL — ABNORMAL HIGH (ref <117)

## 2014-06-26 ENCOUNTER — Telehealth: Payer: Self-pay | Admitting: *Deleted

## 2014-06-26 ENCOUNTER — Encounter: Payer: Self-pay | Admitting: Internal Medicine

## 2014-06-26 NOTE — Telephone Encounter (Signed)
Nurse Practioner from pain management called to inquire why Dr. Patsy Lager would have prescribed the Hydrocodone. Pt had appt pain management on 7/7. Script was written on 7/3. Pt called out office stating his appt was cancelled and he needed a 2 week supply to get him through til the new appt. This was a lie. Pt had the appt and went to the appt. Pain management is going to address this. Asked me to forward this on to Dr. Patsy Lager.

## 2014-06-28 ENCOUNTER — Telehealth (HOSPITAL_COMMUNITY): Payer: Self-pay | Admitting: Vascular Surgery

## 2014-06-28 NOTE — Telephone Encounter (Signed)
Pt gained 20lbs in the last week

## 2014-06-28 NOTE — Telephone Encounter (Signed)
Left message to call back  On cell #, home # mailbox is full and unable to accept new messages

## 2014-06-28 NOTE — Telephone Encounter (Signed)
Pt called with c/o increased weight Pt states he came by office early in the afternoon requesting appointment Advised to return  Go to ER for further evaluation as no provider is available at this time  Pt states he would rather return home to increase po diuretics Advised pt to take Metolazone x 2 days, with additional 20 meQ of Potassium Call office on Monday 07/01/14 with update BE SURE TO KEEP FOLLOW UP ON 07/02/14 Also advised if symptoms get worse over the weekend PT SHOULD REPORT TO ER FOR FURTHER EVALUATION  Pt voiced understanding

## 2014-07-02 ENCOUNTER — Encounter (HOSPITAL_COMMUNITY): Payer: Self-pay

## 2014-07-02 ENCOUNTER — Ambulatory Visit (HOSPITAL_COMMUNITY)
Admission: RE | Admit: 2014-07-02 | Discharge: 2014-07-02 | Disposition: A | Discharge status: Home / Self Care | Payer: Medicare HMO | Source: Ambulatory Visit | Attending: Internal Medicine | Admitting: Internal Medicine

## 2014-07-02 VITALS — BP 114/62 | HR 86 | Wt 248.4 lb

## 2014-07-02 DIAGNOSIS — I4891 Unspecified atrial fibrillation: Secondary | ICD-10-CM

## 2014-07-02 DIAGNOSIS — N183 Chronic kidney disease, stage 3 unspecified: Secondary | ICD-10-CM

## 2014-07-02 DIAGNOSIS — I480 Paroxysmal atrial fibrillation: Secondary | ICD-10-CM

## 2014-07-02 DIAGNOSIS — I5022 Chronic systolic (congestive) heart failure: Secondary | ICD-10-CM | POA: Insufficient documentation

## 2014-07-02 DIAGNOSIS — I4892 Unspecified atrial flutter: Secondary | ICD-10-CM | POA: Diagnosis present

## 2014-07-02 DIAGNOSIS — I509 Heart failure, unspecified: Secondary | ICD-10-CM | POA: Insufficient documentation

## 2014-07-02 LAB — PRO B NATRIURETIC PEPTIDE: Pro B Natriuretic peptide (BNP): 696.7 pg/mL — ABNORMAL HIGH (ref 0–125)

## 2014-07-02 NOTE — Patient Instructions (Signed)
Will call you about lab results and adjust diuretics as needed.  For now on only take metolazone 2.5 mg and only when instructed by provider.  Follow up in 2 weeks  Do the following things EVERYDAY: 1) Weigh yourself in the morning before breakfast. Write it down and keep it in a log. 2) Take your medicines as prescribed 3) Eat low salt foods-Limit salt (sodium) to 2000 mg per day.  4) Stay as active as you can everyday 5) Limit all fluids for the day to less than 2 liters 6)

## 2014-07-02 NOTE — Progress Notes (Signed)
Patient ID: Frank Davidson, male   DOB: 08-05-1951, 63 y.o.   MRN: [id number]  PCP: Adline Mango FNP GI: Dr Yves Dill Rheumatologist:   HPI: [name] is a 63 yo male with a hx of obesity, PAF, HTN, COPD, HTN, gout, DM 2, CVA and CHF due to NICM.  Has previously followed in the [facility] in [place]. Previous cath in 2007 showed NICM with normal cors. Was living alone in [place] and apparently had issues managing medications by himself and was getting confused with PCP and cardiologists medications and was not taking his meds correctly. He moved to [place] to be near his nieces. Issues in the past with ACE-I due to renal failure. Highest creatinine in our system is ~1.5 but was apparently higher previously - thought to be related to taking his medications incorrectly.   Was admitted in 06/2013 from [facility] with left sided weakness and was found to have a moderate to large right hemispheric infarct. Went back to [employer].   Admitted to [facility] 10/14 through 09/09/13 with ADHF. Diuresed 30 pounds with IV lasix and transitioned to lasix 80 mg po bid. Continued on 25 mg carvedilol bid, isordil 20 mg daily.  Started on lisinopril 2.5 mg daily. Discharge weight 229 pounds. 09/29/13 CT abdomen and pelvis - no obstruction . Diverticulosis. No inflammatory changes.   Follow up for Heart Failure: Patient showed up Friday wanting to be seen for 20 lb weight gain. Clinic was closed and told him if he needed to be seen that bad to go to ED. He did not want to go to ED so was instructed to take metolazone 2.5 mg for 2 days with extra potassium. He however took metolazone 5 mg for 3 days and no extra potassium. Weight down 8 lbs over the weekend. Reports he was wheezing before taking extra diuretics but much improved. Denies SOB, orthopnea, PND or CP. Can't walk like he used to because of hip pain and saw ortho and they recommended hip replacement. Following a low salt diet and drinking less than 2L a  day.   ECHO 11/08/13 EF 15%   Labs 09/07/13 K 3.9 Creatinine 1.18  Labs 09/17/13 dig level 1.7 K 4.4 Creatinine 1.41 Pro BNP 2256 Labs 09/25/13 K 6.1 Creatinine 2.57  Labs 09/28/13  K 5.3 Creatinine 1.98  Labs 10/01/13 Dig level 2.5 --->dig stopped Labs 10/05/13 K 4.8 Creatinine 1.79 Labs 11/01/13 K 3.3 --Supplemented Creatinine 1.3 Uric Acid 9.3 AST 23 ALT 12 Hemoglobin 13 Hemoglobin 7.2  Labs 11/21/13 K 4.4 Creatinine 1.3            01/02/14 K 4.2, Creatinine 1.1            01/10/14 K 4.6, Creatinine 1.28           04/22/14 K 4.0 Creatinine 1.58         05/13/14 K 4.3, creatinine 1.46, pro-BNP 1377  SH: Retired Engineer, 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  Past Medical History  Diagnosis Date  . Fibromyalgia   . Atrial fibrillation   . Hypertension   . Osteoarthritis   . COPD (chronic obstructive pulmonary disease)   . Diabetes mellitus without complication   . Coronary artery disease   . Neuropathy   . Ocular herpes   . Gout   . CKD (chronic kidney disease), stage III   .  CHF (congestive heart failure)     EF 15%  . Chronic pain   . Hyperlipidemia   . Nonischemic cardiomyopathy   . Ocular herpes   . Neuropathy   . Left-sided weakness   . Acute on chronic systolic heart failure   . Stroke     x 5. last stroke aug 2014  . Sleep apnea with use of continuous positive airway pressure (CPAP)     does not use cpap  . Decreased pedal pulses     Current Outpatient Prescriptions  Medication Sig Dispense Refill  . allopurinol (ZYLOPRIM) 100 MG tablet Take 100 mg by mouth daily with breakfast.      . allopurinol (ZYLOPRIM) 100 MG tablet TAKE 1 TABLET (100 MG TOTAL) BY MOUTH DAILY.  30 tablet  6  . apixaban (ELIQUIS) 5 MG TABS tablet Take 5 mg by mouth 2 (two) times daily.      . calcium carbonate (TUMS - DOSED IN MG ELEMENTAL CALCIUM) 500 MG chewable tablet Chew 1 tablet  by mouth 3 (three) times daily.      . carvedilol (COREG) 25 MG tablet Take 25 mg by mouth 2 (two) times daily with a meal.      . colchicine 0.6 MG tablet Take 2 pills (1.2mg ) once as needed for gout flare.  30 tablet  0  . cyclobenzaprine (FLEXERIL) 5 MG tablet Take 1/2-1 tab po nightly for pain/muscle spasms  30 tablet  0  . FLECTOR 1.3 % PTCH Place 0.5 patches onto the skin as needed.      . furosemide (LASIX) 80 MG tablet Take 1 tablet (80 mg total) by mouth 2 (two) times daily.  60 tablet  6  . glipiZIDE (GLUCOTROL) 5 MG tablet Take 5 mg by mouth 2 (two) times daily before a meal.      . hydrALAZINE (APRESOLINE) 50 MG tablet TAKE 1 TABLET (50 MG TOTAL) BY MOUTH 2 (TWO) TIMES DAILY.  60 tablet  3  . HYDROcodone-acetaminophen (NORCO) 10-325 MG per tablet Take 1 tablet by mouth 3 (three) times daily as needed.  42 tablet  0  . isosorbide mononitrate (IMDUR) 60 MG 24 hr tablet TAKE 1 TABLET (60 MG TOTAL) BY MOUTH DAILY.  30 tablet  3  . lisinopril (PRINIVIL,ZESTRIL) 2.5 MG tablet Take 2.5 mg by mouth 2 (two) times daily.      Marland Kitchen LYRICA 150 MG capsule Take 1 capsule by mouth as needed.      . metolazone (ZAROXOLYN) 5 MG tablet Take 5 mg by mouth as needed.      . pantoprazole (PROTONIX) 40 MG tablet TAKE 1 TABLET (40 MG TOTAL) BY MOUTH 2 (TWO) TIMES DAILY.  60 tablet  2  . potassium chloride SA (K-DUR,KLOR-CON) 20 MEQ tablet Take 1 tablet (20 mEq total) by mouth daily.  30 tablet  6  . spironolactone (ALDACTONE) 25 MG tablet Take 1 tablet (25 mg total) by mouth daily.  30 tablet  3   No current facility-administered medications for this encounter.    Filed Vitals:   07/02/14 0925  BP: 114/62  Pulse: 86  Weight: 248 lb 6 oz (112.662 kg)  SpO2: 96%   PHYSICAL EXAM: General:  Obese male;  Ambulated into clinic with a walker HEENT: normal Neck: supple. JVP difficult to assess d/t body habitus but appears mildly elevated.  Carotids 2+ bilaterally; no bruits. No lymphadenopathy or thryomegaly  appreciated. Cor: PMI normal. Regular rate & rhythm. No rubs, gallops or murmurs.  Lungs: clear Abdomen: obese soft, nontender, mildly distended. No hepatosplenomegaly. No bruits or masses. Good bowel sounds. Extremities: no cyanosis, clubbing, rash, 1+ bilateral  edema.  Neuro: alert & orientedx3, cranial nerves grossly intact. Affect pleasant.  EKG: NSR 67 bpm  ASSESSMENT & PLAN:  1) Chronic Systolic Heart Failure: NICM, EF 15-20% (10/2013) - NYHA III symptoms and volume status mildly elevated. It is very difficult to assess volume but weight is up about 12 lbs from last visit. He took metolazone 5 mg for 3 days over the weekend with no extra potassium, however was supposed to only take metolazone 2.5 mg for 2 days and an extra 20 meq of potassium. Will switch from lasix 80 mg BID to torsemide 60 mg q am and 40 mg q pm. Check BMET and pro-BNP today and in 7-10 days. Instructed to take metolazone only when instructed and to only take 2.5 mg (will send new prescription to pharmacy). - He has been on goal dose coreg 25 mg BID for awhile, may need to decrease. - Continue lisinopril 2.5 mg BID, spiro 25 mg daily and hydralazine 50 mg TID and Imdur 60 mg daily.   - It is becoming more and more difficult to manage his fluid. I am not sure if he takes his medications correctly. He is not interested in an ICD which could help with looking at thoracic impedence and volume as well as protect him from SCD. We talked about Cardiomems device last visit however he was not interested but after talking today he is willing to get device, will talk to Dr. Gala RomneyBensimhon.  - Reinforced the need and importance of daily weights, a low sodium diet and to avoid soups, and fluid restriction (less than 2 L a day). Instructed to call the HF clinic if weight increases more than 3 lbs overnight or 5 lbs in a week.  2) Gout - Continue allopurinol. Continue to follow with rheumatology. Is now going to Pain management clinic for pain.   3) Atrial fibrillation-  In NSR today. Continue BB and Eliquis. No s/s of bleeding.  4) Chronic renal failure, stage II-III - baseline Cr 1.46-1.8. Check BMET today.    F/U 2 weeks  Ulla Potashosgrove, Ludie Pavlik B  NP-C 9:30 AM   Addendum: Labs reviewed and K+ 4.5, BUN 33, creatinine 1.82 (1.60) and pro-BNP 696.7. Will change patient to torsemide 40 mg BID.

## 2014-07-03 ENCOUNTER — Encounter: Payer: Self-pay | Admitting: Family Medicine

## 2014-07-05 ENCOUNTER — Telehealth (HOSPITAL_COMMUNITY): Payer: Self-pay

## 2014-07-05 ENCOUNTER — Ambulatory Visit (HOSPITAL_COMMUNITY)
Admission: RE | Admit: 2014-07-05 | Discharge: 2014-07-05 | Disposition: A | Discharge status: Home / Self Care | Payer: Medicare HMO | Source: Ambulatory Visit | Attending: Internal Medicine | Admitting: Internal Medicine

## 2014-07-05 DIAGNOSIS — I5022 Chronic systolic (congestive) heart failure: Secondary | ICD-10-CM

## 2014-07-05 DIAGNOSIS — I517 Cardiomegaly: Secondary | ICD-10-CM | POA: Diagnosis not present

## 2014-07-05 DIAGNOSIS — I509 Heart failure, unspecified: Secondary | ICD-10-CM | POA: Insufficient documentation

## 2014-07-05 MED ORDER — TORSEMIDE 20 MG PO TABS: 40.0000 mg | ORAL_TABLET | Freq: Two times a day (BID) | ORAL | Status: DC

## 2014-07-05 NOTE — Telephone Encounter (Signed)
Lab results reviewed with patient, instructed to stop lasix and start torsemide 40mg  twice daily starting today.  Aware and agreeable.  Rx sent to preferred pharmacy electronically. [address] [facility]

## 2014-07-05 NOTE — Progress Notes (Signed)
*  PRELIMINARY RESULTS* Echocardiogram 2D Echocardiogram has been performed.  Frank Davidson 07/05/2014, 3:13 PM

## 2014-07-06 ENCOUNTER — Other Ambulatory Visit: Payer: Self-pay | Admitting: Family

## 2014-07-06 ENCOUNTER — Other Ambulatory Visit (HOSPITAL_COMMUNITY): Payer: Self-pay | Admitting: Adult Health

## 2014-07-06 DIAGNOSIS — I10 Essential (primary) hypertension: Secondary | ICD-10-CM

## 2014-07-08 ENCOUNTER — Telehealth (HOSPITAL_COMMUNITY): Payer: Self-pay | Admitting: Vascular Surgery

## 2014-07-08 NOTE — Telephone Encounter (Signed)
Pt wants to know if he  has clearance to get hip surgery... Please advise

## 2014-07-08 NOTE — Telephone Encounter (Signed)
No longer a pt of [facility]. Pt has been seeing Dr. Patsy Lager

## 2014-07-08 NOTE — Telephone Encounter (Signed)
Echo 8/14 with severely depressed function, no # given, will send to Dr Gala Romney for review

## 2014-07-10 NOTE — Telephone Encounter (Signed)
He is high risk for CV complications. Would avoid, if possible.

## 2014-07-12 ENCOUNTER — Encounter (HOSPITAL_COMMUNITY): Payer: Self-pay | Admitting: Pharmacy Technician

## 2014-07-16 ENCOUNTER — Encounter (HOSPITAL_COMMUNITY): Payer: Self-pay

## 2014-07-16 ENCOUNTER — Ambulatory Visit (HOSPITAL_COMMUNITY)
Admission: RE | Admit: 2014-07-16 | Discharge: 2014-07-16 | Disposition: A | Discharge status: Home / Self Care | Payer: Medicare HMO | Source: Ambulatory Visit | Attending: Cardiology | Admitting: Cardiology

## 2014-07-16 VITALS — BP 100/60 | HR 71 | Resp 20 | Wt 250.1 lb

## 2014-07-16 DIAGNOSIS — I480 Paroxysmal atrial fibrillation: Secondary | ICD-10-CM

## 2014-07-16 DIAGNOSIS — N183 Chronic kidney disease, stage 3 unspecified: Secondary | ICD-10-CM

## 2014-07-16 DIAGNOSIS — I5022 Chronic systolic (congestive) heart failure: Secondary | ICD-10-CM

## 2014-07-16 DIAGNOSIS — G4733 Obstructive sleep apnea (adult) (pediatric): Secondary | ICD-10-CM

## 2014-07-16 MED ORDER — LISINOPRIL 2.5 MG PO TABS
2.5000 mg | ORAL_TABLET | Freq: Two times a day (BID) | ORAL | Status: DC
Start: 2014-07-16 — End: 2014-10-19

## 2014-07-16 NOTE — Progress Notes (Signed)
Patient ID: Frank Davidson, male   DOB: [birth year], 63 y.o.   MRN: [id number]  PCP: Frank Mango FNP GI: Dr Frank Davidson Nephrologist: Dr. Briant Davidson  HPI: [name] is a 63 yo male with a hx of obesity, PAF, HTN, COPD, HTN, gout, DM 2, CVA and CHF due to NICM.  Has previously followed in the [facility] in [place]. Previous cath in 2007 showed NICM with normal cors. Was living alone in [place] and apparently had issues managing medications by himself and was getting confused with PCP and cardiologists medications and was not taking his meds correctly. He moved to [place] to be near his nieces. Issues in the past with ACE-I due to renal failure. Highest creatinine in our system is ~1.5 but was apparently higher previously - thought to be related to taking his medications incorrectly.   Was admitted in 06/2013 from [facility] with left sided weakness and was found to have a moderate to large right hemispheric infarct. Went back to [facility].   Admitted to [facility] 10/14 through 09/09/13 with ADHF. Diuresed 30 pounds with IV lasix and transitioned to lasix 80 mg po bid. Continued on 25 mg carvedilol bid, isordil 20 mg daily.  Started on lisinopril 2.5 mg daily. Discharge weight 229 pounds. 09/29/13 CT abdomen and pelvis - no obstruction . Diverticulosis. No inflammatory changes.   Follow up for Heart Failure: Last visit switched from lasix 80 gm BID to torsemide 40 mg BID for volume overload. Reports breathing was ok until 2 days ago. In a lot of pain with hip and having trouble getting up and moving. Taking 3 Percocet a day or more. Denies CP, PND or orthopnea. + edema. Weight at home 240-250lbs. Following a low salt diet and drinking less than 2L a day.   ECHO 11/08/13 EF 15%  ECHO 07/05/14: EF very depressed, Dr Frank Davidson reviewed.   Labs 09/07/13 K 3.9 Creatinine 1.18  Labs 09/17/13 dig level 1.7 K 4.4 Creatinine 1.41 Pro BNP 2256 Labs 09/25/13 K 6.1 Creatinine 2.57  Labs 09/28/13  K 5.3  Creatinine 1.98  Labs 10/01/13 Dig level 2.5 --->dig stopped Labs 10/05/13 K 4.8 Creatinine 1.79 Labs 11/01/13 K 3.3 --Supplemented Creatinine 1.3 Uric Acid 9.3 AST 23 ALT 12 Hemoglobin 13 Hemoglobin 7.2  Labs 11/21/13 K 4.4 Creatinine 1.3            01/02/14 K 4.2, Creatinine 1.1            01/10/14 K 4.6, Creatinine 1.28           04/22/14 K 4.0 Creatinine 1.58         05/13/14 K 4.3, creatinine 1.46, pro-BNP 1377         07/02/14 K 4.5, creatinine 1.82, pro-BNP 696.7  SH: Retired Engineer, 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  Past Medical History  Diagnosis Date  . Fibromyalgia   . Atrial fibrillation   . Hypertension   . Osteoarthritis   . COPD (chronic obstructive pulmonary disease)   . Diabetes mellitus without complication   . Coronary artery disease   . Neuropathy   . Ocular herpes   . Gout   . CKD (chronic kidney disease), stage III   . CHF (congestive heart failure)     EF 15%  . Chronic pain   . Hyperlipidemia   . Nonischemic cardiomyopathy   .  Ocular herpes   . Neuropathy   . Left-sided weakness   . Acute on chronic systolic heart failure   . Stroke     x 5. last stroke aug 2014  . Sleep apnea with use of continuous positive airway pressure (CPAP)     does not use cpap  . Decreased pedal pulses     Current Outpatient Prescriptions  Medication Sig Dispense Refill  . allopurinol (ZYLOPRIM) 100 MG tablet Take 100 mg by mouth daily with breakfast.      . apixaban (ELIQUIS) 5 MG TABS tablet Take 5 mg by mouth 2 (two) times daily.      . Ascorbic Acid (VITAMIN C PO) Take 1 tablet by mouth daily.      . carvedilol (COREG) 25 MG tablet Take 25 mg by mouth 2 (two) times daily with a meal.      . colchicine 0.6 MG tablet Take 1.2 mg by mouth daily as needed (Gout).      . cyclobenzaprine (FLEXERIL) 5 MG tablet Take 2.5-5 mg by mouth at bedtime as needed for muscle  spasms.      Marland Kitchen FLECTOR 1.3 % PTCH Place 0.5 patches onto the skin daily as needed (Pain).       Marland Kitchen glipiZIDE (GLUCOTROL) 5 MG tablet Take 5 mg by mouth 2 (two) times daily before a meal.      . Green Tea, Camillia sinensis, (GREEN TEA PO) Take 1 tablet by mouth daily.      . hydrALAZINE (APRESOLINE) 50 MG tablet Take 50 mg by mouth 2 (two) times daily.      Marland Kitchen HYDROcodone-acetaminophen (NORCO) 10-325 MG per tablet Take 1 tablet by mouth 3 (three) times daily as needed (Pain).      . isosorbide mononitrate (IMDUR) 60 MG 24 hr tablet Take 60 mg by mouth every morning.      Marland Kitchen lisinopril (PRINIVIL,ZESTRIL) 2.5 MG tablet Take 2.5 mg by mouth 2 (two) times daily.      . Menthol-Methyl Salicylate (MUSCLE RUB) 10-15 % CREA Apply 1 application topically 3 (three) times daily as needed for muscle pain.      . metolazone (ZAROXOLYN) 5 MG tablet Take 5 mg by mouth daily as needed (Swelling).       . pantoprazole (PROTONIX) 40 MG tablet Take 40 mg by mouth 2 (two) times daily.      . potassium chloride SA (K-DUR,KLOR-CON) 20 MEQ tablet Take 1 tablet (20 mEq total) by mouth daily.  30 tablet  6  . pregabalin (LYRICA) 75 MG capsule Take 75 mg by mouth 2 (two) times daily as needed.      . SELENIUM PO Take 1 tablet by mouth daily.      Marland Kitchen spironolactone (ALDACTONE) 25 MG tablet Take 1 tablet (25 mg total) by mouth daily.  30 tablet  3  . torsemide (DEMADEX) 20 MG tablet Take 2 tablets (40 mg total) by mouth 2 (two) times daily.  180 tablet  3   No current facility-administered medications for this encounter.    Filed Vitals:   07/16/14 1120  BP: 100/60  Pulse: 71  Resp: 20  Weight: 250 lb 2 oz (113.456 kg)  SpO2: 97%   PHYSICAL EXAM: General:  Obese male;  Ambulated into clinic with a walker,  HEENT: normal Neck: supple. JVP difficult to assess d/t body habitus but appears mildly elevated.  Carotids 2+ bilaterally; no bruits. No lymphadenopathy or thryomegaly appreciated. Cor: PMI normal. Regular rate &  rhythm. No rubs, gallops or murmurs. Lungs: clear Abdomen: obese soft, nontender, mildly distended. No hepatosplenomegaly. No bruits or masses. Good bowel sounds. Extremities: no cyanosis, clubbing, rash, 1-2+ bilateral  edema.  Neuro: alert & orientedx3, cranial nerves grossly intact. Affect pleasant.  ASSESSMENT & PLAN:  1) Chronic Systolic Heart Failure: NICM, EF 5-10% (06/2014) - NYHA III symptoms and volume status mildly elevated. It is very difficult to assess volume but think he remains mildly elevated. Will continue torsemide 40 gm BID, last creatinine was elevated. - Continue goal dose coreg 25 mg BID and will assess when we place Cardiomems device if it needs to be cut back. - Continue lisinopril 2.5 mg BID, spiro 25 mg daily and hydralazine 50 mg TID and Imdur 60 mg daily.   - It is becoming more and more difficult to manage his fluid. He is not interested in an ICD which could help with looking at thoracic impedence and volume as well as protect him from SCD. Dr. Gala Davidson discussed with patient about Cardiomems device and he would like to proceed, will get set up.  - Dr. Gala Davidson discussed with patient that he thinks he is very high risk for hip surgery.  - Reinforced the need and importance of daily weights, a low sodium diet and to avoid soups, and fluid restriction (less than 2 L a day). Instructed to call the HF clinic if weight increases more than 3 lbs overnight or 5 lbs in a week.  2) Gout - Continue allopurinol. Continue to follow with rheumatology. Is now going to Pain management clinic for pain.  3) Atrial fibrillation-  Appears to be in NSR today. Continue BB and Eliquis. No s/s of bleeding.  4) Chronic renal failure, stage II-III - baseline Cr 1.46-1.8. Check BMET today.    F/U 1 month  Ulla Potash B  NP-C 11:39 AM

## 2014-07-16 NOTE — Patient Instructions (Addendum)
Continue current medications.  Will call to set up for Cardiomems device placement.  Follow up 1 month  Do the following things EVERYDAY: 1) Weigh yourself in the morning before breakfast. Write it down and keep it in a log. 2) Take your medicines as prescribed 3) Eat low salt foods-Limit salt (sodium) to 2000 mg per day.  4) Stay as active as you can everyday 5) Limit all fluids for the day to less than 2 liters 6)

## 2014-07-17 ENCOUNTER — Encounter (HOSPITAL_COMMUNITY)
Admission: RE | Admit: 2014-07-17 | Discharge: 2014-07-17 | Disposition: A | Discharge status: Home / Self Care | Payer: Medicare HMO | Source: Ambulatory Visit | Attending: Orthopedic Surgery | Admitting: Orthopedic Surgery

## 2014-07-17 ENCOUNTER — Encounter (HOSPITAL_COMMUNITY): Payer: Self-pay

## 2014-07-17 DIAGNOSIS — M169 Osteoarthritis of hip, unspecified: Secondary | ICD-10-CM | POA: Diagnosis present

## 2014-07-17 DIAGNOSIS — Z01818 Encounter for other preprocedural examination: Secondary | ICD-10-CM | POA: Diagnosis present

## 2014-07-17 DIAGNOSIS — M1610 Unilateral primary osteoarthritis, unspecified hip: Secondary | ICD-10-CM | POA: Insufficient documentation

## 2014-07-17 DIAGNOSIS — M25559 Pain in unspecified hip: Secondary | ICD-10-CM | POA: Diagnosis present

## 2014-07-17 HISTORY — DX: Other amnesia: R41.3

## 2014-07-17 LAB — BASIC METABOLIC PANEL
Anion gap: 16 — ABNORMAL HIGH (ref 5–15)
BUN: 54 mg/dL — AB (ref 6–23)
CALCIUM: 10.4 mg/dL (ref 8.4–10.5)
CO2: 30 mEq/L (ref 19–32)
Chloride: 94 mEq/L — ABNORMAL LOW (ref 96–112)
Creatinine, Ser: 2.2 mg/dL — ABNORMAL HIGH (ref 0.50–1.35)
GFR calc Af Amer: 35 mL/min — ABNORMAL LOW (ref >90)
GFR calc non Af Amer: 30 mL/min — ABNORMAL LOW (ref >90)
GLUCOSE: 106 mg/dL — AB (ref 70–99)
Potassium: 3.9 mEq/L (ref 3.7–5.3)
SODIUM: 140 meq/L (ref 137–147)

## 2014-07-17 LAB — ABO/RH: ABO/RH(D): O NEG

## 2014-07-17 LAB — PROTIME-INR
INR: 1.16 (ref 0.00–1.49)
PROTHROMBIN TIME: 14.8 s (ref 11.6–15.2)

## 2014-07-17 LAB — URINALYSIS, ROUTINE W REFLEX MICROSCOPIC
Bilirubin Urine: NEGATIVE
GLUCOSE, UA: NEGATIVE mg/dL
Hgb urine dipstick: NEGATIVE
Ketones, ur: NEGATIVE mg/dL
Leukocytes, UA: NEGATIVE
Nitrite: NEGATIVE
Protein, ur: NEGATIVE mg/dL
Specific Gravity, Urine: 1.009 (ref 1.005–1.030)
Urobilinogen, UA: 0.2 mg/dL (ref 0.0–1.0)
pH: 7 (ref 5.0–8.0)

## 2014-07-17 LAB — CBC
HCT: 36.5 % — ABNORMAL LOW (ref 39.0–52.0)
Hemoglobin: 11.7 g/dL — ABNORMAL LOW (ref 13.0–17.0)
MCH: 28.6 pg (ref 26.0–34.0)
MCHC: 32.1 g/dL (ref 30.0–36.0)
MCV: 89.2 fL (ref 78.0–100.0)
Platelets: 192 10*3/uL (ref 150–400)
RBC: 4.09 MIL/uL — ABNORMAL LOW (ref 4.22–5.81)
RDW: 14.1 % (ref 11.5–15.5)
WBC: 6.7 10*3/uL (ref 4.0–10.5)

## 2014-07-17 LAB — SURGICAL PCR SCREEN
MRSA, PCR: POSITIVE — AB
Staphylococcus aureus: POSITIVE — AB

## 2014-07-17 LAB — APTT: aPTT: 45 seconds — ABNORMAL HIGH (ref 24–37)

## 2014-07-17 NOTE — Pre-Procedure Instructions (Signed)
07-17-14 EKG,Echo 8'15 Epic. CXR 1 view 10'14 Epic.

## 2014-07-17 NOTE — Progress Notes (Signed)
07-17-14 1700 viewable labs in Epic, please note-CMP- pt has history renal disease.

## 2014-07-17 NOTE — Patient Instructions (Addendum)
19 Frank Davidson  07/17/2014   Your procedure is scheduled on: 9-1  -2015Tuesday  Enter through [facility] Entrance and follow signs to Short Stay Center. Arrive at    0830    AM .  Call this number if you have problems the morning of surgery: [phone]  Or Presurgical Testing [phone].   For Living Will and/or Health Care Power Attorney Forms: please provide copy for your medical record,may bring AM of surgery(Forms should be already notarized -we do not provide this service).(07-17-14  No information preferred today).      Do not eat food/ or drink: After Midnight.    Take these medicines the morning of surgery with A SIP OF WATER: Allopurinol.Carvedilol.Hydrocodone. Isosorbide. Pantoprazole. Lyrica. Do not take ay Diabetic meds AM of. Follow MD instructions for Eliquis use.   Do not wear jewelry, make-up or nail polish.  Do not wear lotions, powders, or perfumes. You may wear deodorant.  Do not shave 48 hours(2 days) prior to first CHG shower(legs and under arms).(Shaving face and neck okay.)  Do not bring valuables to the hospital.(Hospital is not responsible for lost valuables).  Contacts, dentures or removable bridgework, body piercing, hair pins may not be worn into surgery.  Leave suitcase in the car. After surgery it may be brought to your room.  For patients admitted to the hospital, checkout time is 11:00 AM the day of discharge.(Restricted visitors-Any Persons displaying flu-like symptoms or illness).    Patients discharged the day of surgery will not be allowed to drive home. Must have responsible person with you x 24 hours once discharged.  Name and phone number of your driver: will arrange  Special Instructions: CHG(Chlorhedine 4%-"Hibiclens","Betasept","Aplicare") Shower Use Special Wash: see special instructions.(avoid face and genitals)   Please read over the following fact sheets that you were given: MRSA Information, Blood Transfusion fact sheet,  Incentive Spirometry Instruction.  Remember : Type/Screen "Blue armbands" - may not be removed once applied(would result in being retested AM of surgery, if removed).    ___________________________    [facility] - Preparing for Surgery Before surgery, you can play an important role.  Because skin is not sterile, your skin needs to be as free of germs as possible.  You can reduce the number of germs on your skin by washing with CHG (chlorahexidine gluconate) soap before surgery.  CHG is an antiseptic cleaner which kills germs and bonds with the skin to continue killing germs even after washing. Please DO NOT use if you have an allergy to CHG or antibacterial soaps.  If your skin becomes reddened/irritated stop using the CHG and inform your nurse when you arrive at Short Stay. Do not shave (including legs and underarms) for at least 48 hours prior to the first CHG shower.  You may shave your face/neck. Please follow these instructions carefully:  1.  Shower with CHG Soap the night before surgery and the  morning of Surgery.  2.  If you choose to wash your hair, wash your hair first as usual with your  normal  shampoo.  3.  After you shampoo, rinse your hair and body thoroughly to remove the  shampoo.                           4.  Use CHG as you would any other liquid soap.  You can apply chg directly  to the skin and wash  Gently with a scrungie or clean washcloth.  5.  Apply the CHG Soap to your body ONLY FROM THE NECK DOWN.   Do not use on face/ open                           Wound or open sores. Avoid contact with eyes, ears mouth and genitals (private parts).                       Wash face,  Genitals (private parts) with your normal soap.             6.  Wash thoroughly, paying special attention to the area where your surgery  will be performed.  7.  Thoroughly rinse your body with warm water from the neck down.  8.  DO NOT shower/wash with your normal soap after using  and rinsing off  the CHG Soap.                9.  Pat yourself dry with a clean towel.            10.  Wear clean pajamas.            11.  Place clean sheets on your bed the night of your first shower and do not  sleep with pets. Day of Surgery : Do not apply any lotions/deodorants the morning of surgery.  Please wear clean clothes to the hospital/surgery center.  FAILURE TO FOLLOW THESE INSTRUCTIONS MAY RESULT IN THE CANCELLATION OF YOUR SURGERY PATIENT SIGNATURE_________________________________  NURSE SIGNATURE__________________________________  ________________________________________________________________________  WHAT IS A BLOOD TRANSFUSION? Blood Transfusion Information  A transfusion is the replacement of blood or some of its parts. Blood is made up of multiple cells which provide different functions.  Red blood cells carry oxygen and are used for blood loss replacement.  White blood cells fight against infection.  Platelets control bleeding.  Plasma helps clot blood.  Other blood products are available for specialized needs, such as hemophilia or other clotting disorders. BEFORE THE TRANSFUSION  Who gives blood for transfusions?   Healthy volunteers who are fully evaluated to make sure their blood is safe. This is blood bank blood. Transfusion therapy is the safest it has ever been in the practice of medicine. Before blood is taken from a donor, a complete history is taken to make sure that person has no history of diseases nor engages in risky social behavior (examples are intravenous drug use or sexual activity with multiple partners). The donor's travel history is screened to minimize risk of transmitting infections, such as malaria. The donated blood is tested for signs of infectious diseases, such as HIV and hepatitis. The blood is then tested to be sure it is compatible with you in order to minimize the chance of a transfusion reaction. If you or a relative donates  blood, this is often done in anticipation of surgery and is not appropriate for emergency situations. It takes many days to process the donated blood. RISKS AND COMPLICATIONS Although transfusion therapy is very safe and saves many lives, the main dangers of transfusion include:   Getting an infectious disease.  Developing a transfusion reaction. This is an allergic reaction to something in the blood you were given. Every precaution is taken to prevent this. The decision to have a blood transfusion has been considered carefully by your caregiver before blood is given. Blood is not given unless the benefits  outweigh the risks. AFTER THE TRANSFUSION  Right after receiving a blood transfusion, you will usually feel much better and more energetic. This is especially true if your red blood cells have gotten low (anemic). The transfusion raises the level of the red blood cells which carry oxygen, and this usually causes an energy increase.  The nurse administering the transfusion will monitor you carefully for complications. HOME CARE INSTRUCTIONS  No special instructions are needed after a transfusion. You may find your energy is better. Speak with your caregiver about any limitations on activity for underlying diseases you may have. SEEK MEDICAL CARE IF:   Your condition is not improving after your transfusion.  You develop redness or irritation at the intravenous (IV) site. SEEK IMMEDIATE MEDICAL CARE IF:  Any of the following symptoms occur over the next 12 hours:  Shaking chills.  You have a temperature by mouth above 102 F (38.9 C), not controlled by medicine.  Chest, back, or muscle pain.  People around you feel you are not acting correctly or are confused.  Shortness of breath or difficulty breathing.  Dizziness and fainting.  You get a rash or develop hives.  You have a decrease in urine output.  Your urine turns a dark color or changes to pink, red, or brown. Any of the  following symptoms occur over the next 10 days:  You have a temperature by mouth above 102 F (38.9 C), not controlled by medicine.  Shortness of breath.  Weakness after normal activity.  The white part of the eye turns yellow (jaundice).  You have a decrease in the amount of urine or are urinating less often.  Your urine turns a dark color or changes to pink, red, or brown. Document Released: 11/05/2000 Document Revised: 01/31/2012 Document Reviewed: 06/24/2008 Exit[employer] Patient Information 2014 [place], [place].  _______________________________________________________________________  Incentive Spirometer  An incentive spirometer is a tool that can help keep your lungs clear and active. This tool measures how well you are filling your lungs with each breath. Taking long deep breaths may help reverse or decrease the chance of developing breathing (pulmonary) problems (especially infection) following:  A long period of time when you are unable to move or be active. BEFORE THE PROCEDURE   If the spirometer includes an indicator to show your best effort, your nurse or respiratory therapist will set it to a desired goal.  If possible, sit up straight or lean slightly forward. Try not to slouch.  Hold the incentive spirometer in an upright position. INSTRUCTIONS FOR USE  1. Sit on the edge of your bed if possible, or sit up as far as you can in bed or on a chair. 2. Hold the incentive spirometer in an upright position. 3. Breathe out normally. 4. Place the mouthpiece in your mouth and seal your lips tightly around it. 5. Breathe in slowly and as deeply as possible, raising the piston or the ball toward the top of the column. 6. Hold your breath for 3-5 seconds or for as long as possible. Allow the piston or ball to fall to the bottom of the column. 7. Remove the mouthpiece from your mouth and breathe out normally. 8. Rest for a few seconds and repeat Steps 1 through 7 at least 10  times every 1-2 hours when you are awake. Take your time and take a few normal breaths between deep breaths. 9. The spirometer may include an indicator to show your best effort. Use the indicator as a goal to  work toward during each repetition. 10. After each set of 10 deep breaths, practice coughing to be sure your lungs are clear. If you have an incision (the cut made at the time of surgery), support your incision when coughing by placing a pillow or rolled up towels firmly against it. Once you are able to get out of bed, walk around indoors and cough well. You may stop using the incentive spirometer when instructed by your caregiver.  RISKS AND COMPLICATIONS  Take your time so you do not get dizzy or light-headed.  If you are in pain, you may need to take or ask for pain medication before doing incentive spirometry. It is harder to take a deep breath if you are having pain. AFTER USE  Rest and breathe slowly and easily.  It can be helpful to keep track of a log of your progress. Your caregiver can provide you with a simple table to help with this. If you are using the spirometer at home, follow these instructions: SEEK MEDICAL CARE IF:   You are having difficultly using the spirometer.  You have trouble using the spirometer as often as instructed.  Your pain medication is not giving enough relief while using the spirometer.  You develop fever of 100.5 F (38.1 C) or higher. SEEK IMMEDIATE MEDICAL CARE IF:   You cough up bloody sputum that had not been present before.  You develop fever of 102 F (38.9 C) or greater.  You develop worsening pain at or near the incision site. MAKE SURE YOU:   Understand these instructions.  Will watch your condition.  Will get help right away if you are not doing well or get worse. Document Released: 03/21/2007 Document Revised: 01/31/2012 Document Reviewed: 05/22/2007 [facility] Patient Information 2014 [place],  [place].   ________________________________________________________________________

## 2014-07-18 ENCOUNTER — Telehealth: Payer: Self-pay | Admitting: *Deleted

## 2014-07-18 NOTE — Telephone Encounter (Signed)
I called patient to let him know Bun 52 and creatinine is 2.41. Ulla Potash wanted patient to hold Torsemide for 2 days and stop Lisinopril. Patient verbalized understanding. Patient unable to come in and have labs rechecked due to upcoming hip surgery.

## 2014-07-18 NOTE — Pre-Procedure Instructions (Addendum)
07-18-14 0815 Voice message left for pt, Rx called to [facility] [phone] "Alex" , pt. Ask to use Mupirocin ointment as directed, also Contact Isolation is required for stay.Fax message to Dr. Nilsa Nutting office [phone].

## 2014-07-18 NOTE — Progress Notes (Signed)
Pt. 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

## 2014-07-19 ENCOUNTER — Encounter (HOSPITAL_COMMUNITY): Payer: Self-pay | Admitting: Anesthesiology

## 2014-07-19 ENCOUNTER — Encounter: Payer: Self-pay | Admitting: Internal Medicine

## 2014-07-21 NOTE — H&P (Signed)
TOTAL HIP ADMISSION H&P  Patient is admitted for left total hip arthroplasty, anterior approach.  Subjective:  Chief Complaint:    Left hip OA / pain  HPI: Frank Davidson, 63 y.o. male, has a history of pain and functional disability in the left hip(s) due to arthritis.  He is known radiographically and clinically to have advanced left hip osteoarthritis. He has had an unfortunate recent medical course requiring two months hospitalization and four months of rehab following complications related to medication use, he reported related to the use of Actos for his diabetes.  He at this point continuing to work on his overall conditioning. He is noted to have left hip pain and a work up having had advanced left hip osteoarthritis. In the office on exam he is walking with the use of a walker in the office but he uses a cane predominantly. He has a very limited range of motion of his left hip with pain with hip flexion and internal rotation. The right hip otherwise moves fluidly. He does appear to be deconditioned. Dr. Charlann Boxer reviewed with Frank Davidson his current situation with regards to his left hip. He has multiple medical issues going on, the only way his hip will improve, if his quality of life is significantly affected by this, is to perform a left total hip arthroplasty. He wishes to consider these options. I would not want him to go through this unless he understood the medical co-morbidities and risks associated with this. This would include followup with all of his coordinating physicians but would predominantly focus with Dr. Gala Romney on his heart failure issues as well as his diabetes which he states is currently well managed despite the attempts at trying to initiate new treatment with Actos. Dr. Charlann Boxer once again talked to  Frank Davidson once we had the report from his Cardiologist saying he was a high risk candidate for any surgery.  He states that he understands the high risk classification and  associated risks and still wishes to proceed with surgery.  Questions were encouraged, answered and reviewed.Risks, benefits and expectations were discussed with the patient.  Risks including but not limited to the risk of anesthesia, blood clots, nerve damage, blood vessel damage, failure of the prosthesis, infection and up to and including death.  Patient understand the risks, benefits and expectations and wishes to proceed with surgery.   PCP: Abbe Amsterdam, MD  D/C Plans:      Home with HHPT/SNF  Post-op Meds:       No Rx given   Tranexamic Acid:      To be given - topically  (CHF & CVA)  Decadron:      Is not to be given - complicated DM  FYI:     Xarelto post-op  Look at current analgesic medications     Patient Active Problem List   Diagnosis Date Noted  . Gout 04/29/2014  . Decreased pedal pulses 04/19/2014  . Acute esophagitis 12/24/2013  . Dysphagia, unspecified(787.20) 10/01/2013  . Diarrhea 10/01/2013  . Acute on chronic systolic heart failure   . Acute CHF 09/04/2013  . Chest pain 09/04/2013  . Ocular herpes   . Neuropathy   . Left-sided weakness   . Acute on chronic diastolic CHF (congestive heart failure), NYHA class 3 07/09/2013  . Acute ischemic stroke 07/09/2013  . OSA (obstructive sleep apnea) 07/09/2013  . Gout attack 07/07/2013  . Chronic systolic heart failure 07/05/2013  . Stroke 07/03/2013  . CVA (cerebral infarction)  07/03/2013  . A-fib 07/03/2013  . Hypokalemia 07/03/2013  . Hypertension   . COPD (chronic obstructive pulmonary disease)   . Diabetes mellitus without complication   . CKD (chronic kidney disease), stage III   . CHF (congestive heart failure)   . Nonischemic cardiomyopathy   . Polysubstance abuse    Past Medical History  Diagnosis Date  . Atrial fibrillation   . Hypertension   . COPD (chronic obstructive pulmonary disease)   . Diabetes mellitus without complication   . Coronary artery disease   . Neuropathy   . Ocular  herpes   . Gout   . CHF (congestive heart failure)     EF 15%  . Chronic pain   . Hyperlipidemia   . Nonischemic cardiomyopathy   . Ocular herpes   . Neuropathy   . Left-sided weakness   . Acute on chronic systolic heart failure   . Stroke     x 5. last stroke aug 2014  . Sleep apnea with use of continuous positive airway pressure (CPAP)     does not use cpap  . Decreased pedal pulses   . Memory impairment     "short term"  . CKD (chronic kidney disease), stage III     Dr. Briant Cedar follows-holding   . Fibromyalgia     neuropathy- hands, more than feet.  . Osteoarthritis     Past Surgical History  Procedure Laterality Date  . 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 Cardiac catheterization  10/2006    normal coronary arteries  . Tonsillectomy  age 20  . Esophagogastroduodenoscopy N/A 12/24/2013    Procedure: ESOPHAGOGASTRODUODENOSCOPY (EGD);  Surgeon: Louis Meckel, MD;  Location: [facility];  Service: Endoscopy;  Laterality: N/A;  . Colonoscopy N/A 12/24/2013    Procedure: COLONOSCOPY;  Surgeon: Louis Meckel, MD;  Location: [facility] ENDOSCOPY;  Service: Endoscopy;  Laterality: N/A;    No prescriptions prior to admission   Allergies  Allergen Reactions  . Actos [Pioglitazone] Other (See Comments)    Caused blood in urine and fluid retention   . Januvia [Sitagliptin]     "Almost killed me"     History  Substance Use Topics  . Smoking status: Former Smoker -- 0.50 packs/day for 10 years    Types: Cigarettes    Quit date: 12/18/2006  . Smokeless tobacco: Never Used  . Alcohol Use: No     Comment: Quit 2008    Family History  Problem Relation Age of Onset  . Cancer Mother   . Hypertension Mother   . Lung disease Mother   . Diabetes Father   . Diabetes Sister   . Hypertension Sister   . Hypertension Brother   . Hypertension Sister   . Colon cancer Neg Hx      Review of Systems  Constitutional: Negative.   HENT: Negative.   Eyes: Negative.   Respiratory:  Positive for shortness of breath (on exertion).   Gastrointestinal: Negative.   Genitourinary: Negative.   Musculoskeletal: Positive for joint pain and myalgias.  Skin: Negative.   Neurological: Negative.   Endo/Heme/Allergies: Negative.   Psychiatric/Behavioral: Positive for memory loss.    Objective:  Physical Exam  Constitutional: He appears well-developed and well-nourished.  HENT:  Head: Normocephalic.  Neck: Neck supple. No JVD present. No tracheal deviation present. No thyromegaly present.  Cardiovascular: Normal rate.   Respiratory: Effort normal.  GI: Soft. There is no tenderness. There is no guarding.  Musculoskeletal:  Left hip: He exhibits decreased range of motion, decreased strength, tenderness and bony tenderness. He exhibits no swelling, no deformity and no laceration.  Lymphadenopathy:    He has no cervical adenopathy.  Neurological: He is alert.  Skin: Skin is warm and dry.     Labs:  Estimated body mass index is 35.58 kg/(m^2) as calculated from the following:   Height as of 07/17/14: 5\' 10"  (1.778 m).   Weight as of 07/17/14: 112.492 kg (248 lb).   Imaging Review Plain radiographs demonstrate severe degenerative joint disease of the left hip(s). The bone quality appears to be good for age and reported activity level.  Assessment/Plan:  End stage arthritis, left hip(s)  The patient history, physical examination, clinical judgement of the provider and imaging studies are consistent with end stage degenerative joint disease of the left hip(s) and total hip arthroplasty is deemed medically necessary. The treatment options including medical management, injection therapy, arthroscopy and arthroplasty were discussed at length. The risks and benefits of total hip arthroplasty were presented and reviewed. The risks due to aseptic loosening, infection, stiffness, dislocation/subluxation,  thromboembolic complications and other imponderables were discussed.  The  patient acknowledged the explanation, agreed to proceed with the plan and consent was signed. Patient is being admitted for inpatient treatment for surgery, pain control, PT, OT, prophylactic antibiotics, VTE prophylaxis, progressive ambulation and ADL's and discharge planning.The patient is planning to be discharged to skilled nursing facility / home.     Anastasio Auerbach Latrish Mogel   PA-C  07/21/2014, 1:06 PM

## 2014-07-22 ENCOUNTER — Encounter (HOSPITAL_COMMUNITY): Payer: Self-pay | Admitting: Emergency Medicine

## 2014-07-22 ENCOUNTER — Ambulatory Visit: Payer: Commercial Managed Care - HMO | Admitting: Family Medicine

## 2014-07-22 ENCOUNTER — Encounter (HOSPITAL_COMMUNITY): Payer: Self-pay | Admitting: *Deleted

## 2014-07-22 MED ORDER — CEFAZOLIN SODIUM-DEXTROSE 2-3 GM-% IV SOLR: 2.0000 g | INTRAVENOUS | Status: DC

## 2014-07-22 NOTE — Progress Notes (Addendum)
Frank Davidson read  Pre procedure for [facility] PAT with medication instructions.  The instructions read Eliquis per instructions, patient said he did not receive any instructions.  "I took it today , but I don't plan to take it tomorrow."   I asked patient if he had done the Mupirocin treatment, patient said he didn't' know about it, but he would go get it today and begin the treatment today.  I called Dr Nilsa Nutting office and asked if they have instruction on when to stop Frank Davidson, Frank Davidson, Frank Davidson said patient's medical doctor should have instructed patient.

## 2014-07-22 NOTE — Pre-Procedure Instructions (Signed)
07-22-14 0830 Noted not showing on [facility] OR schedule, moved to [facility] OR 07-23-14.W. Layana Konkel,RN

## 2014-07-22 NOTE — Progress Notes (Signed)
Anesthesia Chart Review:  Pt is 63 year old male posted for L hip arthroplasty on 07/23/14 by Dr. Charlann Boxer.   PMH: NICM, Systolic heart failure (NYHA III), EF 5-10%, (06/2014). Atrial fibrillation. Chronic renal failure, stage II-III. Diabetes, stroke, COPD, HTN, CAD, OSA, neuropathy, ocular herpes, L sided weakness, memory impairment.   Medications include: Eliquis (took dose today, 8/31), carvedilol, glipizide, hydralazine, isosorbide mononitrate, lisinopril, metolazone,spironolactone, torsemide, diclofenac patch, allopurinol, colchicine, cyclobenzaprine, pregabalin  Preoperative labs reviewed. 07/17/14 PT/PTT: 14.8/45 BUN/Cr: 54/2.2 (previous results over last 8 months range BUN 22-30/Cr 1.3-1.58. Cardiology note 07/16/14 states previous Cr as high as 2.57 in 09/2013, most in range of 1.1-1.8). (Cardiology aware of recent increase in Cr and has adjusted meds.)  Estimated GFR is 30. Hgb/hct: 11.7/36.5 (one month ago was 11.5/34.5, 8 months ago was 13.3/41)  06/24/14: LFTs normal.   Echo 07/05/14:  -Left ventricle: LV is difficult to see due to poor acoustic windows. Overall function appears severely depressed. Would recomm limited study with contrast to evaluate function. The cavity size was severely dilated. Wall thickness was increased in a pattern of mild LVH. - Left atrium: The atrium was moderately dilated. - Right ventricle: RV is not seen well enough to evaluate function  Note 07/16/14 from Ulla Potash, NP, notes Dr. Gala Romney interpreted EF at 5-10% from this echo. Dr. Gala Romney discussed with patient that he thinks he is very high risk for hip surgery. Pt refused ICD offered by [facility] but is considering Cardiomems (HF monitor).  EKG 07/02/14: NSR, Left axis deviation, pulmonary disease pattern. Previous EKG 09/2013 shows atrial flutter with variable AV block.   Chest X-ray is from 09/28/13 and shows no acute cardiopulmonary findings.   Progress notes from 06/24/14 by Dr. Abbe Amsterdam,  pt's PCP, for medical clearance note pt reports having some strokes last fall, 2014. Dr. Patsy Lager does not clear pt for surgery and suggests if pt is cleared by cardiology he should see neurology for clearance as well prior to surgery.   MRI results 07/03/13:  -Moderate to large-sized right hemispheric acute infarct extends from the right opercular/sub insular region into the right frontal lobe bordering the anterior aspect of the right parietal lobe.  -No obvious intracranial hemorrhage although gradient sequence not obtained.  -Remote left parietal lobe infarct encephalomalacia. Remote  posterior left lenticular nucleus/posterior limb left internal  capsule infarct.  -Small vessel disease type changes   Spoke with Drs. Maple Hudson and Michelle Piper about this patient.  Neurology clearance not necessary.  Pt will likely get spinal anesthesia, and will be required to be off of Eliquis for 5 days prior to surgery due to his poor renal function. Have call in to CHF clinic/Dr. Bensimhon regarding whether or not it is acceptable to have him off of his Eliquis for this length of time.   Notified Sherry in Dr. Nilsa Nutting office that case cannot proceed tomorrow as scheduled. Will update Dr. Nilsa Nutting office when have confirmation pt can be off of Eliquis for 5 days.   Rica Mast, FNP-BC [facility]/Anesthesiology Phone: [phone] 07/22/2014 4:29 PM

## 2014-07-23 ENCOUNTER — Inpatient Hospital Stay (HOSPITAL_COMMUNITY): Admission: RE | Admit: 2014-07-23 | Payer: Medicare HMO | Source: Ambulatory Visit | Admitting: Orthopedic Surgery

## 2014-07-23 ENCOUNTER — Encounter (HOSPITAL_COMMUNITY): Admission: RE | Payer: Self-pay | Source: Ambulatory Visit

## 2014-07-23 LAB — TYPE AND SCREEN
ABO/RH(D): O NEG
Antibody Screen: NEGATIVE

## 2014-07-23 SURGERY — ARTHROPLASTY, HIP, TOTAL, ANTERIOR APPROACH
Anesthesia: Spinal | Site: Hip | Laterality: Left

## 2014-07-23 SURGERY — ARTHROPLASTY, HIP, TOTAL, ANTERIOR APPROACH
Anesthesia: Choice | Site: Hip | Laterality: Left

## 2014-07-23 NOTE — Telephone Encounter (Signed)
Note faxed adn surgery has been cancelled

## 2014-07-30 ENCOUNTER — Telehealth (HOSPITAL_COMMUNITY): Payer: Self-pay | Admitting: Vascular Surgery

## 2014-07-30 NOTE — Telephone Encounter (Addendum)
Pt has some questions and concerns with Dr.Bensimhon about a hip surgery he suppose to have .Marland Kitchen Please Advise .Marland Kitchen Pt called back left message he has weight gain and he wants tso hear from someone about clearance for hip operation.. Pt is threatening to walk in .Marland Kitchen Please advise

## 2014-07-31 NOTE — Telephone Encounter (Signed)
Pt would like to proceed with hip surgery, will send to Dr Gala Romney to review

## 2014-07-31 NOTE — Telephone Encounter (Signed)
He is high-risk. I would recommend deferring.

## 2014-08-01 NOTE — Telephone Encounter (Signed)
Pt aware and note faxed to Dr Charlann Boxer, pt understands and wants to proceed

## 2014-08-02 ENCOUNTER — Telehealth (HOSPITAL_COMMUNITY): Payer: Self-pay | Admitting: *Deleted

## 2014-08-02 NOTE — Telephone Encounter (Signed)
Received labs pt had drawn with Guide-It study on 9/10 K 4.3 bun 59, per Ulla Potash, NP have pt hold Torsemide for 2 days, sch appt with Dr Gala Romney next week, spoke w/pt he is aware and agreeable, appt sch for 9/17 at 10 am

## 2014-08-08 ENCOUNTER — Ambulatory Visit (HOSPITAL_COMMUNITY)
Admission: RE | Admit: 2014-08-08 | Discharge: 2014-08-08 | Disposition: A | Discharge status: Home / Self Care | Payer: Medicare HMO | Source: Ambulatory Visit | Attending: Internal Medicine | Admitting: Internal Medicine

## 2014-08-08 ENCOUNTER — Other Ambulatory Visit: Payer: Self-pay | Admitting: *Deleted

## 2014-08-08 ENCOUNTER — Encounter (HOSPITAL_COMMUNITY): Payer: Self-pay | Admitting: *Deleted

## 2014-08-08 ENCOUNTER — Encounter (HOSPITAL_COMMUNITY): Payer: Self-pay

## 2014-08-08 VITALS — BP 116/68 | HR 85 | Wt 250.2 lb

## 2014-08-08 DIAGNOSIS — R0601 Orthopnea: Secondary | ICD-10-CM | POA: Insufficient documentation

## 2014-08-08 DIAGNOSIS — E1142 Type 2 diabetes mellitus with diabetic polyneuropathy: Secondary | ICD-10-CM | POA: Diagnosis not present

## 2014-08-08 DIAGNOSIS — E669 Obesity, unspecified: Secondary | ICD-10-CM | POA: Diagnosis not present

## 2014-08-08 DIAGNOSIS — I509 Heart failure, unspecified: Secondary | ICD-10-CM | POA: Insufficient documentation

## 2014-08-08 DIAGNOSIS — Z79899 Other long term (current) drug therapy: Secondary | ICD-10-CM | POA: Insufficient documentation

## 2014-08-08 DIAGNOSIS — M1990 Unspecified osteoarthritis, unspecified site: Secondary | ICD-10-CM | POA: Insufficient documentation

## 2014-08-08 DIAGNOSIS — I5022 Chronic systolic (congestive) heart failure: Secondary | ICD-10-CM | POA: Diagnosis not present

## 2014-08-08 DIAGNOSIS — I4891 Unspecified atrial fibrillation: Secondary | ICD-10-CM | POA: Insufficient documentation

## 2014-08-08 DIAGNOSIS — J449 Chronic obstructive pulmonary disease, unspecified: Secondary | ICD-10-CM | POA: Diagnosis not present

## 2014-08-08 DIAGNOSIS — N183 Chronic kidney disease, stage 3 unspecified: Secondary | ICD-10-CM | POA: Diagnosis not present

## 2014-08-08 DIAGNOSIS — M109 Gout, unspecified: Secondary | ICD-10-CM | POA: Insufficient documentation

## 2014-08-08 DIAGNOSIS — R413 Other amnesia: Secondary | ICD-10-CM | POA: Insufficient documentation

## 2014-08-08 DIAGNOSIS — N179 Acute kidney failure, unspecified: Secondary | ICD-10-CM

## 2014-08-08 DIAGNOSIS — R0989 Other specified symptoms and signs involving the circulatory and respiratory systems: Secondary | ICD-10-CM | POA: Insufficient documentation

## 2014-08-08 DIAGNOSIS — I2510 Atherosclerotic heart disease of native coronary artery without angina pectoris: Secondary | ICD-10-CM | POA: Diagnosis not present

## 2014-08-08 DIAGNOSIS — Z833 Family history of diabetes mellitus: Secondary | ICD-10-CM | POA: Diagnosis not present

## 2014-08-08 DIAGNOSIS — I129 Hypertensive chronic kidney disease with stage 1 through stage 4 chronic kidney disease, or unspecified chronic kidney disease: Secondary | ICD-10-CM | POA: Diagnosis not present

## 2014-08-08 DIAGNOSIS — Z01810 Encounter for preprocedural cardiovascular examination: Secondary | ICD-10-CM

## 2014-08-08 DIAGNOSIS — Z8673 Personal history of transient ischemic attack (TIA), and cerebral infarction without residual deficits: Secondary | ICD-10-CM | POA: Insufficient documentation

## 2014-08-08 DIAGNOSIS — M25559 Pain in unspecified hip: Secondary | ICD-10-CM | POA: Insufficient documentation

## 2014-08-08 DIAGNOSIS — B009 Herpesviral infection, unspecified: Secondary | ICD-10-CM | POA: Insufficient documentation

## 2014-08-08 DIAGNOSIS — IMO0001 Reserved for inherently not codable concepts without codable children: Secondary | ICD-10-CM | POA: Diagnosis not present

## 2014-08-08 DIAGNOSIS — E1149 Type 2 diabetes mellitus with other diabetic neurological complication: Secondary | ICD-10-CM | POA: Diagnosis not present

## 2014-08-08 DIAGNOSIS — I428 Other cardiomyopathies: Secondary | ICD-10-CM | POA: Insufficient documentation

## 2014-08-08 DIAGNOSIS — E785 Hyperlipidemia, unspecified: Secondary | ICD-10-CM | POA: Insufficient documentation

## 2014-08-08 DIAGNOSIS — G4733 Obstructive sleep apnea (adult) (pediatric): Secondary | ICD-10-CM | POA: Diagnosis not present

## 2014-08-08 DIAGNOSIS — J4489 Other specified chronic obstructive pulmonary disease: Secondary | ICD-10-CM | POA: Insufficient documentation

## 2014-08-08 DIAGNOSIS — N189 Chronic kidney disease, unspecified: Secondary | ICD-10-CM

## 2014-08-08 LAB — PRO B NATRIURETIC PEPTIDE: Pro B Natriuretic peptide (BNP): 984.5 pg/mL — ABNORMAL HIGH (ref 0–125)

## 2014-08-08 NOTE — Progress Notes (Signed)
Patient ID: Frank Davidson, male   DOB: [birth year], 63 y.o.   MRN: [id number]  PCP: Adline Mango FNP GI: Dr Yves Dill Nephrologist: Dr. Briant Cedar  HPI: Frank Davidson is a 63 yo male with a hx of obesity, PAF, HTN, COPD, HTN, gout, DM 2, CVA and CHF due to NICM.  Has previously followed in the [facility] in [place]. Previous cath in 2007 showed NICM with normal cors. Was living alone in [place] and apparently had issues managing medications by himself and was getting confused with PCP and cardiologists medications and was not taking his meds correctly. He moved to [place] to be near his nieces. Issues in the past with ACE-I due to renal failure. Highest creatinine in our system is ~1.5 but was apparently higher previously - thought to be related to taking his medications incorrectly.   Was admitted in 06/2013 from [facility] with left sided weakness and was found to have a moderate to large right hemispheric infarct. Went back to [facility].   Admitted to [facility] 10/14 through 09/09/13 with ADHF. Diuresed 30 pounds with IV lasix and transitioned to lasix 80 mg po bid. Continued on 25 mg carvedilol bid, isordil 20 mg daily.  Started on lisinopril 2.5 mg daily. Discharge weight 229 pounds. 09/29/13 CT abdomen and pelvis - no obstruction . Diverticulosis. No inflammatory changes.   Follow up for Heart Failure: Since last visit renal function continues to trend up and last creatinine 2.6 and was instructed to hold torsemide for 2 days. Now back on 40 bid.  Patient is scheduled for hip surgery on 9/20. Still with lots of hip pain. SOB with minimal activity. Denies CP or LE edema. + orthopnea (sleeping in chair). Weight at home 248 lbs.(which is table but up 15 pounds from the summer) Following a low salt diet and drinking less than 2L a day.   ECHO 11/08/13 EF 15%  ECHO 07/05/14: EF very depressed, Dr Gala Romney reviewed.   Labs 10/01/13 Dig level 2.5 --->dig stopped Labs 10/05/13 K 4.8 Creatinine  1.79 Labs 11/01/13 K 3.3 --Supplemented Creatinine 1.3 Uric Acid 9.3 AST 23 ALT 12 Hemoglobin 13 Hemoglobin 7.2  Labs 11/21/13 K 4.4 Creatinine 1.3            01/02/14 K 4.2, Creatinine 1.1            01/10/14 K 4.6, Creatinine 1.28           04/22/14 K 4.0 Creatinine 1.58         05/13/14 K 4.3, creatinine 1.46, pro-BNP 1377          06/24/14: K 3.9, creatinine 1.49         07/02/14 K 4.5, creatinine 1.82, pro-BNP 696.7         07/16/14 K 3.7, creatinine 2.41         08/01/14 K 4.3 creatinine 2.6 (demadex Davidson for 2 days) - then restarted at 40 bid (baseline dose)   SH: Retired Engineer, 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  Past Medical History  Diagnosis Date  . Atrial fibrillation   . Hypertension   . COPD (chronic obstructive pulmonary disease)   . Diabetes mellitus without complication   . Coronary artery disease   . Neuropathy   . Ocular herpes   . Gout   . CHF (congestive heart failure)     EF  15%  . Chronic pain   . Hyperlipidemia   . Nonischemic cardiomyopathy   . Ocular herpes   . Neuropathy   . Left-sided weakness   . Acute on chronic systolic heart failure   . Stroke     x 5. last stroke aug 2014  . Decreased pedal pulses   . Memory impairment     "short term"  . CKD (chronic kidney disease), stage III     Dr. Mattingly follows-holding   . Fibromyalgia     neuropathy- hands, more than feet.  . Osteoarthritis   . Sleep apnea with use of continuous positive airway pressure (CPAP)     does not use cpap    Current Outpatient Prescriptions  Medication Sig Dispense Refill  . allopurinol (ZYLOPRIM) 100 MG tablet Take 100 mg by mouth daily with breakfast.      . apixaban (ELIQUIS) 5 MG TABS tablet Take 5 mg by mouth 2 (two) times daily.      . Ascorbic Acid (VITAMIN C PO) Take 1 tablet by mouth daily.      . carvedilol (COREG) 25 MG tablet Take 25 mg by mouth 2  (two) times daily with a meal.      . colchicine 0.6 MG tablet Take 1.2 mg by mouth daily as needed (Gout).      . cyclobenzaprine (FLEXERIL) 5 MG tablet Take 2.5-5 mg by mouth at bedtime as needed for muscle spasms.      . FLECTOR 1.3 % PTCH Place 0.5 patches onto the skin daily as needed (Pain).       . glipiZIDE (GLUCOTROL) 5 MG tablet Take 5 mg by mouth 2 (two) times daily before a meal.      . Green Tea, Camillia sinensis, (GREEN TEA PO) Take 1 tablet by mouth daily.      . hydrALAZINE (APRESOLINE) 50 MG tablet Take 50 mg by mouth 2 (two) times daily.      . HYDROcodone-acetaminophen (NORCO) 10-325 MG per tablet Take 1 tablet by mouth 3 (three) times daily as needed (Pain).      . isosorbide mononitrate (IMDUR) 60 MG 24 hr tablet Take 60 mg by mouth every morning.      . lisinopril (PRINIVIL,ZESTRIL) 2.5 MG tablet Take 1 tablet (2.5 mg total) by mouth 2 (two) times daily.  60 tablet  3  . Menthol-Methyl Salicylate (MUSCLE RUB) 10-15 % CREA Apply 1 application topically 3 (three) times daily as needed for muscle pain.      . metolazone (ZAROXOLYN) 5 MG tablet Take 5 mg by mouth daily as needed (Swelling).       . pantoprazole (PROTONIX) 40 MG tablet Take 40 mg by mouth 2 (two) times daily.      . potassium chloride SA (K-DUR,KLOR-CON) 20 MEQ tablet Take 1 tablet (20 mEq total) by mouth daily.  30 tablet  6  . pregabalin (LYRICA) 75 MG capsule Take 75 mg by mouth 2 (two) times daily as needed.      . SELENIUM PO Take 1 tablet by mouth daily.      . spironolactone (ALDACTONE) 25 MG tablet Take 1 tablet (25 mg total) by mouth daily.  30 tablet  3  . torsemide (DEMADEX) 20 MG tablet Take 2 tablets (40 mg total) by mouth 2 (two) times daily.  180 tablet  3   No current facility-administered medications for this encounter.    Filed Vitals:   08/08/14 1005  BP: 116/68  Pulse: 85    Weight: 250 lb 4 oz (113.513 kg)  SpO2: 97%   PHYSICAL EXAM: General:  Obese male;  Ambulated into clinic with  a walker,  HEENT: normal Neck: supple. JVP difficult to assess d/t body habitus but appears mildly elevated.  Carotids 2+ bilaterally; no bruits. No lymphadenopathy or thryomegaly appreciated. Cor: PMI normal. Regular rate & rhythm. No rubs, gallops or murmurs. Lungs: clear Abdomen: obese soft, nontender, mildly distended. No hepatosplenomegaly. No bruits or masses. Good bowel sounds. Extremities: no cyanosis, clubbing, rash, 1-2+ bilateral  edema.  Neuro: alert & orientedx3, cranial nerves grossly intact. Affect pleasant.  ASSESSMENT & PLAN:  1) Chronic Systolic Heart Failure: NICM, EF 5-10% (06/2014) - NYHA IIIBsymptoms and volume status mildly elevated. It is very difficult to assess volume but think he remains mildly elevated. Will continue torsemide 40 gm BID, last creatinine was elevated. - Continue goal dose coreg 25 mg BID  - Continue lisinopril 2.5 mg BID, spiro 25 mg daily and hydralazine 50 mg TID and Imdur 60 mg daily.   - It is becoming more and more difficult to manage his fluid. He is not interested in an ICD which could help with looking at thoracic impedence and volume as well as protect him from SCD.  - Dr. Gala Romney discussed with patient that he thinks he is very high risk for hip surgery.  - Reinforced the need and importance of daily weights, a low sodium diet and to avoid soups, and fluid restriction (less than 2 L a day). Instructed to call the HF clinic if weight increases more than 3 lbs overnight or 5 lbs in a week.  2) Gout - Continue allopurinol. Continue to follow with rheumatology. Is now going to Pain management clinic for pain.  3) Atrial fibrillation-  Appears to be in NSR today. Continue BB and Eliquis. No s/s of bleeding.  4) Acute on Chronic renal failure, stage II-III - baseline Cr 1.46-1.8. Check BMET today.   5) Pre-operative CV risk stratification  F/U 1 month  Ulla Potash B  NP-C 10:09 AM  Patient seen and examined with Ulla Potash, NP. We  discussed all aspects of the encounter. I agree with the assessment and plan as stated above.   Difficult situation. He has severely reduced LV function and now renal function getting worse. I am worried this could be a low output state (vs overdiuresis). I am also concerned that he is at very high risk for adverse cardiac events with hip surgery particularly if he is low output. I think the next step is to check labs today and then proceed with RHC cath early next week to more clearly evaluate filling pressures and cardiac output. If output is normal I would feel a bit better about his operative risk. If output is low then this may be prohibitive.   Daniel Bensimhon,MD 10:58 AM

## 2014-08-08 NOTE — Patient Instructions (Signed)
Right Heart Cath on 9/22, see instruction sheet  Your physician recommends that you schedule a follow-up appointment in: 1 month

## 2014-08-08 NOTE — Addendum Note (Signed)
Encounter addended by: Noralee Space, RN on: 08/08/2014 11:15 AM<BR>     Documentation filed: Orders

## 2014-08-08 NOTE — Addendum Note (Signed)
Encounter addended by: Noralee Space, RN on: 08/08/2014 11:10 AM<BR>     Documentation filed: Patient Instructions Section

## 2014-08-09 ENCOUNTER — Telehealth (HOSPITAL_COMMUNITY): Payer: Self-pay | Admitting: *Deleted

## 2014-08-09 MED ORDER — CARVEDILOL 25 MG PO TABS: 12.5000 mg | ORAL_TABLET | Freq: Two times a day (BID) | ORAL | Status: DC

## 2014-08-09 NOTE — Telephone Encounter (Signed)
Received pt's labs from yesterday bun 57, cr 2.96, k 4.5 per Ulla Potash, NP hold torsemide and potassium for 2 days, decrease Carvedilol to 12.5 mg Twice daily, pt aware and agreeable, labs will be rechecked on 9/22 when pt comes in for heart cath

## 2014-08-12 ENCOUNTER — Encounter: Payer: Self-pay | Admitting: Internal Medicine

## 2014-08-13 ENCOUNTER — Encounter (HOSPITAL_COMMUNITY)
Admission: RE | Disposition: A | Discharge status: Home / Self Care | Payer: Self-pay | Source: Ambulatory Visit | Attending: Internal Medicine

## 2014-08-13 ENCOUNTER — Ambulatory Visit (HOSPITAL_COMMUNITY)
Admission: RE | Admit: 2014-08-13 | Discharge: 2014-08-13 | Disposition: A | Discharge status: Home / Self Care | Payer: Medicare HMO | Source: Ambulatory Visit | Attending: Internal Medicine | Admitting: Internal Medicine

## 2014-08-13 DIAGNOSIS — E119 Type 2 diabetes mellitus without complications: Secondary | ICD-10-CM | POA: Insufficient documentation

## 2014-08-13 DIAGNOSIS — E669 Obesity, unspecified: Secondary | ICD-10-CM | POA: Insufficient documentation

## 2014-08-13 DIAGNOSIS — IMO0001 Reserved for inherently not codable concepts without codable children: Secondary | ICD-10-CM | POA: Diagnosis not present

## 2014-08-13 DIAGNOSIS — Z8673 Personal history of transient ischemic attack (TIA), and cerebral infarction without residual deficits: Secondary | ICD-10-CM | POA: Diagnosis not present

## 2014-08-13 DIAGNOSIS — N183 Chronic kidney disease, stage 3 unspecified: Secondary | ICD-10-CM | POA: Diagnosis not present

## 2014-08-13 DIAGNOSIS — G4700 Insomnia, unspecified: Secondary | ICD-10-CM | POA: Insufficient documentation

## 2014-08-13 DIAGNOSIS — I509 Heart failure, unspecified: Secondary | ICD-10-CM | POA: Insufficient documentation

## 2014-08-13 DIAGNOSIS — I129 Hypertensive chronic kidney disease with stage 1 through stage 4 chronic kidney disease, or unspecified chronic kidney disease: Secondary | ICD-10-CM | POA: Diagnosis not present

## 2014-08-13 DIAGNOSIS — J449 Chronic obstructive pulmonary disease, unspecified: Secondary | ICD-10-CM | POA: Diagnosis not present

## 2014-08-13 DIAGNOSIS — G8929 Other chronic pain: Secondary | ICD-10-CM | POA: Diagnosis not present

## 2014-08-13 DIAGNOSIS — I4891 Unspecified atrial fibrillation: Secondary | ICD-10-CM | POA: Diagnosis not present

## 2014-08-13 DIAGNOSIS — M1990 Unspecified osteoarthritis, unspecified site: Secondary | ICD-10-CM | POA: Diagnosis not present

## 2014-08-13 DIAGNOSIS — Z6834 Body mass index (BMI) 34.0-34.9, adult: Secondary | ICD-10-CM | POA: Diagnosis not present

## 2014-08-13 DIAGNOSIS — I5022 Chronic systolic (congestive) heart failure: Secondary | ICD-10-CM | POA: Diagnosis present

## 2014-08-13 DIAGNOSIS — I428 Other cardiomyopathies: Secondary | ICD-10-CM | POA: Diagnosis not present

## 2014-08-13 DIAGNOSIS — J4489 Other specified chronic obstructive pulmonary disease: Secondary | ICD-10-CM | POA: Insufficient documentation

## 2014-08-13 DIAGNOSIS — M109 Gout, unspecified: Secondary | ICD-10-CM | POA: Insufficient documentation

## 2014-08-13 DIAGNOSIS — G609 Hereditary and idiopathic neuropathy, unspecified: Secondary | ICD-10-CM | POA: Insufficient documentation

## 2014-08-13 DIAGNOSIS — Z7901 Long term (current) use of anticoagulants: Secondary | ICD-10-CM | POA: Insufficient documentation

## 2014-08-13 DIAGNOSIS — I2510 Atherosclerotic heart disease of native coronary artery without angina pectoris: Secondary | ICD-10-CM | POA: Diagnosis not present

## 2014-08-13 DIAGNOSIS — I279 Pulmonary heart disease, unspecified: Secondary | ICD-10-CM

## 2014-08-13 DIAGNOSIS — Z79899 Other long term (current) drug therapy: Secondary | ICD-10-CM | POA: Diagnosis not present

## 2014-08-13 HISTORY — PX: RIGHT HEART CATHETERIZATION: SHX5447

## 2014-08-13 LAB — GLUCOSE, CAPILLARY
Glucose-Capillary: 92 mg/dL (ref 70–99)
Glucose-Capillary: 80 mg/dL (ref 70–99)

## 2014-08-13 LAB — POCT I-STAT 3, VENOUS BLOOD GAS (G3P V)
ACID-BASE EXCESS: 1 mmol/L (ref 0.0–2.0)
Acid-Base Excess: 2 mmol/L (ref 0.0–2.0)
BICARBONATE: 27.3 meq/L — AB (ref 20.0–24.0)
Bicarbonate: 26.4 mEq/L — ABNORMAL HIGH (ref 20.0–24.0)
Bicarbonate: 28 mEq/L — ABNORMAL HIGH (ref 20.0–24.0)
O2 Saturation: 62 %
O2 Saturation: 68 %
O2 Saturation: 68 %
PCO2 VEN: 47.4 mmHg (ref 45.0–50.0)
PCO2 VEN: 49.4 mmHg (ref 45.0–50.0)
PCO2 VEN: 50.3 mmHg — AB (ref 45.0–50.0)
PH VEN: 7.351 — AB (ref 7.250–7.300)
PH VEN: 7.354 — AB (ref 7.250–7.300)
PO2 VEN: 34 mmHg (ref 30.0–45.0)
TCO2: 28 mmol/L (ref 0–100)
TCO2: 29 mmol/L (ref 0–100)
TCO2: 30 mmol/L (ref 0–100)
pH, Ven: 7.353 — ABNORMAL HIGH (ref 7.250–7.300)
pO2, Ven: 37 mmHg (ref 30.0–45.0)
pO2, Ven: 38 mmHg (ref 30.0–45.0)

## 2014-08-13 LAB — CBC
HEMATOCRIT: 37.8 % — AB (ref 39.0–52.0)
HEMOGLOBIN: 12.4 g/dL — AB (ref 13.0–17.0)
MCH: 28.7 pg (ref 26.0–34.0)
MCHC: 32.8 g/dL (ref 30.0–36.0)
MCV: 87.5 fL (ref 78.0–100.0)
Platelets: 186 10*3/uL (ref 150–400)
RBC: 4.32 MIL/uL (ref 4.22–5.81)
RDW: 13.7 % (ref 11.5–15.5)
WBC: 6.2 10*3/uL (ref 4.0–10.5)

## 2014-08-13 LAB — BASIC METABOLIC PANEL
Anion gap: 13 (ref 5–15)
BUN: 62 mg/dL — ABNORMAL HIGH (ref 6–23)
CHLORIDE: 97 meq/L (ref 96–112)
CO2: 28 meq/L (ref 19–32)
CREATININE: 2.68 mg/dL — AB (ref 0.50–1.35)
Calcium: 10.2 mg/dL (ref 8.4–10.5)
GFR calc non Af Amer: 24 mL/min — ABNORMAL LOW (ref >90)
GFR, EST AFRICAN AMERICAN: 27 mL/min — AB (ref >90)
Glucose, Bld: 94 mg/dL (ref 70–99)
Potassium: 4.2 mEq/L (ref 3.7–5.3)
Sodium: 138 mEq/L (ref 137–147)

## 2014-08-13 LAB — PROTIME-INR
INR: 1.12 (ref 0.00–1.49)
Prothrombin Time: 14.4 seconds (ref 11.6–15.2)

## 2014-08-13 SURGERY — RIGHT HEART CATH
Anesthesia: LOCAL

## 2014-08-13 MED ORDER — SODIUM CHLORIDE 0.9 % IJ SOLN: 3.0000 mL | INTRAMUSCULAR | Status: DC | PRN

## 2014-08-13 MED ORDER — SODIUM CHLORIDE 0.9 % IJ SOLN: 3.0000 mL | Freq: Two times a day (BID) | INTRAMUSCULAR | Status: DC

## 2014-08-13 MED ORDER — HEPARIN (PORCINE) IN NACL 2-0.9 UNIT/ML-% IJ SOLN
INTRAMUSCULAR | Status: AC
  Filled 2014-08-13: qty 1000

## 2014-08-13 MED ORDER — SODIUM CHLORIDE 0.9 % IV SOLN: 250.0000 mL | INTRAVENOUS | Status: DC | PRN

## 2014-08-13 MED ORDER — LIDOCAINE HCL (PF) 1 % IJ SOLN
INTRAMUSCULAR | Status: AC
  Filled 2014-08-13: qty 30

## 2014-08-13 MED ORDER — ASPIRIN 81 MG PO CHEW
81.0000 mg | CHEWABLE_TABLET | ORAL | Status: AC
  Administered 2014-08-13: 81 mg via ORAL

## 2014-08-13 MED ORDER — SODIUM CHLORIDE 0.9 % IV SOLN
INTRAVENOUS | Status: DC
  Administered 2014-08-13: 09:00:00 via INTRAVENOUS

## 2014-08-13 MED ORDER — ASPIRIN 81 MG PO CHEW
81.0000 mg | CHEWABLE_TABLET | ORAL | Status: DC
Start: 2014-08-14 — End: 2014-08-13

## 2014-08-13 MED ORDER — MIDAZOLAM HCL 2 MG/2ML IJ SOLN
INTRAMUSCULAR | Status: AC
  Filled 2014-08-13: qty 2

## 2014-08-13 MED ORDER — FENTANYL CITRATE 0.05 MG/ML IJ SOLN
INTRAMUSCULAR | Status: AC
  Filled 2014-08-13: qty 2

## 2014-08-13 MED ORDER — ASPIRIN 81 MG PO CHEW
CHEWABLE_TABLET | ORAL | Status: AC
  Filled 2014-08-13: qty 1

## 2014-08-13 NOTE — CV Procedure (Addendum)
Cardiac Cath Procedure Note:  Indication:  HF. Worsening renal function.  Procedures performed:  1) Right heart catheterization  Description of procedure:   The risks and indication of the procedure were explained. Consent was signed and placed on the chart. An appropriate timeout was taken prior to the procedure. The right neck was prepped and draped in the routine sterile fashion and anesthetized with 1% local lidocaine.   A 7 FR venous sheath was placed in the right internal jugular vein using a modified Seldinger technique. A standard Swan-Ganz catheter was used for the procedure.   Complications: None apparent.  Findings:  RA = 7 RV = 50/3/10 PA =  54/22 (34) PCW = 16 Fick cardiac output/index = 6.9/3.0 Thermo CO/CI = 4.7/2.1 PVR =  4.1 WU Ao sat =  94% PA sat = 68%,68% SVC sat (sheath) = 62%  Assessment: 1. Mild pulmonary HTN 2. Well compensated left sided filling pressures 3. Normal cardiac output  Plan/Discussion:  Hold torsemide one more day. Decrease dose to 40 daily.   Arvilla Meres MD  9:38 AM

## 2014-08-13 NOTE — Interval H&P Note (Signed)
History and Physical Interval Note:  08/13/2014 9:38 AM  Frank Davidson  has presented today for surgery, with the diagnosis of hf  The various methods of treatment have been discussed with the patient and family. After consideration of risks, benefits and other options for treatment, the patient has consented to  Procedure(s): RIGHT HEART CATH (N/A) as a surgical intervention .  The patient's history has been reviewed, patient examined, no change in status, stable for surgery.  I have reviewed the patient's chart and labs.  Questions were answered to the patient's satisfaction.     Daniel Bensimhon

## 2014-08-13 NOTE — Discharge Instructions (Signed)
°  HOME CARE   Only take medicine as told by your doctor.  Remove bandages (dressings) in 24 hours or as told by your doctor.  Change the bandage if it gets wet, dirty, or starts to smell.  May take shower in 24 hours. Do not take baths, swim, or do anything that puts your wound under water.  Keep all doctor visits as told. GET HELP RIGHT AWAY IF:   Yellowish-white fluid (pus) comes from the wound.  Medicine does not lessen your pain.  There is a red streak going away from the wound.  You have a fever. MAKE SURE YOU:   Understand these instructions.  Will watch your condition.  Will get help right away if you are not doing well or get worse. Document Released: 08/17/2008 Document Revised: 01/31/2012 Document Reviewed: 03/14/2011 [facility] Patient Information 2015 [place], [place]. This information is not intended to replace advice given to you by your health care provider. Make sure you discuss any questions you have with your health care provider.

## 2014-08-13 NOTE — H&P (View-Only) (Signed)
Patient ID: Frank Davidson, male   DOB: [birth year], 63 y.o.   MRN: [id number]  PCP: Adline Mango FNP GI: Dr Yves Dill Nephrologist: Dr. Briant Cedar  HPI: Frank Davidson is a 63 yo male with a hx of obesity, PAF, HTN, COPD, HTN, gout, DM 2, CVA and CHF due to NICM.  Has previously followed in the [facility] in [place]. Previous cath in 2007 showed NICM with normal cors. Was living alone in [place] and apparently had issues managing medications by himself and was getting confused with PCP and cardiologists medications and was not taking his meds correctly. He moved to [place] to be near his nieces. Issues in the past with ACE-I due to renal failure. Highest creatinine in our system is ~1.5 but was apparently higher previously - thought to be related to taking his medications incorrectly.   Was admitted in 06/2013 from [facility] with left sided weakness and was found to have a moderate to large right hemispheric infarct. Went back to [facility].   Admitted to [facility] 10/14 through 09/09/13 with ADHF. Diuresed 30 pounds with IV lasix and transitioned to lasix 80 mg po bid. Continued on 25 mg carvedilol bid, isordil 20 mg daily.  Started on lisinopril 2.5 mg daily. Discharge weight 229 pounds. 09/29/13 CT abdomen and pelvis - no obstruction . Diverticulosis. No inflammatory changes.   Follow up for Heart Failure: Since last visit renal function continues to trend up and last creatinine 2.6 and was instructed to hold torsemide for 2 days. Now back on 40 bid.  Patient is scheduled for hip surgery on 9/20. Still with lots of hip pain. SOB with minimal activity. Denies CP or LE edema. + orthopnea (sleeping in chair). Weight at home 248 lbs.(which is table but up 15 pounds from the summer) Following a low salt diet and drinking less than 2L a day.   ECHO 11/08/13 EF 15%  ECHO 07/05/14: EF very depressed, Dr Gala Romney reviewed.   Labs 10/01/13 Dig level 2.5 --->dig stopped Labs 10/05/13 K 4.8 Creatinine  1.79 Labs 11/01/13 K 3.3 --Supplemented Creatinine 1.3 Uric Acid 9.3 AST 23 ALT 12 Hemoglobin 13 Hemoglobin 7.2  Labs 11/21/13 K 4.4 Creatinine 1.3            01/02/14 K 4.2, Creatinine 1.1            01/10/14 K 4.6, Creatinine 1.28           04/22/14 K 4.0 Creatinine 1.58         05/13/14 K 4.3, creatinine 1.46, pro-BNP 1377          06/24/14: K 3.9, creatinine 1.49         07/02/14 K 4.5, creatinine 1.82, pro-BNP 696.7         07/16/14 K 3.7, creatinine 2.41         08/01/14 K 4.3 creatinine 2.6 (demadex Davidson for 2 days) - then restarted at 40 bid (baseline dose)   SH: Retired Engineer, 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  Past Medical History  Diagnosis Date  . Atrial fibrillation   . Hypertension   . COPD (chronic obstructive pulmonary disease)   . Diabetes mellitus without complication   . Coronary artery disease   . Neuropathy   . Ocular herpes   . Gout   . CHF (congestive heart failure)     EF  15%  . Chronic pain   . Hyperlipidemia   . Nonischemic cardiomyopathy   . Ocular herpes   . Neuropathy   . Left-sided weakness   . Acute on chronic systolic heart failure   . Stroke     x 5. last stroke aug 2014  . Decreased pedal pulses   . Memory impairment     "short term"  . CKD (chronic kidney disease), stage III     Dr. Briant Cedar follows-holding   . Fibromyalgia     neuropathy- hands, more than feet.  . Osteoarthritis   . Sleep apnea with use of continuous positive airway pressure (CPAP)     does not use cpap    Current Outpatient Prescriptions  Medication Sig Dispense Refill  . allopurinol (ZYLOPRIM) 100 MG tablet Take 100 mg by mouth daily with breakfast.      . apixaban (ELIQUIS) 5 MG TABS tablet Take 5 mg by mouth 2 (two) times daily.      . Ascorbic Acid (VITAMIN C PO) Take 1 tablet by mouth daily.      . carvedilol (COREG) 25 MG tablet Take 25 mg by mouth 2  (two) times daily with a meal.      . colchicine 0.6 MG tablet Take 1.2 mg by mouth daily as needed (Gout).      . cyclobenzaprine (FLEXERIL) 5 MG tablet Take 2.5-5 mg by mouth at bedtime as needed for muscle spasms.      Marland Kitchen FLECTOR 1.3 % PTCH Place 0.5 patches onto the skin daily as needed (Pain).       Marland Kitchen glipiZIDE (GLUCOTROL) 5 MG tablet Take 5 mg by mouth 2 (two) times daily before a meal.      . Green Tea, Camillia sinensis, (GREEN TEA PO) Take 1 tablet by mouth daily.      . hydrALAZINE (APRESOLINE) 50 MG tablet Take 50 mg by mouth 2 (two) times daily.      Marland Kitchen HYDROcodone-acetaminophen (NORCO) 10-325 MG per tablet Take 1 tablet by mouth 3 (three) times daily as needed (Pain).      . isosorbide mononitrate (IMDUR) 60 MG 24 hr tablet Take 60 mg by mouth every morning.      Marland Kitchen lisinopril (PRINIVIL,ZESTRIL) 2.5 MG tablet Take 1 tablet (2.5 mg total) by mouth 2 (two) times daily.  60 tablet  3  . Menthol-Methyl Salicylate (MUSCLE RUB) 10-15 % CREA Apply 1 application topically 3 (three) times daily as needed for muscle pain.      . metolazone (ZAROXOLYN) 5 MG tablet Take 5 mg by mouth daily as needed (Swelling).       . pantoprazole (PROTONIX) 40 MG tablet Take 40 mg by mouth 2 (two) times daily.      . potassium chloride SA (K-DUR,KLOR-CON) 20 MEQ tablet Take 1 tablet (20 mEq total) by mouth daily.  30 tablet  6  . pregabalin (LYRICA) 75 MG capsule Take 75 mg by mouth 2 (two) times daily as needed.      . SELENIUM PO Take 1 tablet by mouth daily.      Marland Kitchen spironolactone (ALDACTONE) 25 MG tablet Take 1 tablet (25 mg total) by mouth daily.  30 tablet  3  . torsemide (DEMADEX) 20 MG tablet Take 2 tablets (40 mg total) by mouth 2 (two) times daily.  180 tablet  3   No current facility-administered medications for this encounter.    Filed Vitals:   08/08/14 1005  BP: 116/68  Pulse: 85  Weight: 250 lb 4 oz (113.513 kg)  SpO2: 97%   PHYSICAL EXAM: General:  Obese male;  Ambulated into clinic with  a walker,  HEENT: normal Neck: supple. JVP difficult to assess d/t body habitus but appears mildly elevated.  Carotids 2+ bilaterally; no bruits. No lymphadenopathy or thryomegaly appreciated. Cor: PMI normal. Regular rate & rhythm. No rubs, gallops or murmurs. Lungs: clear Abdomen: obese soft, nontender, mildly distended. No hepatosplenomegaly. No bruits or masses. Good bowel sounds. Extremities: no cyanosis, clubbing, rash, 1-2+ bilateral  edema.  Neuro: alert & orientedx3, cranial nerves grossly intact. Affect pleasant.  ASSESSMENT & PLAN:  1) Chronic Systolic Heart Failure: NICM, EF 5-10% (06/2014) - NYHA IIIBsymptoms and volume status mildly elevated. It is very difficult to assess volume but think he remains mildly elevated. Will continue torsemide 40 gm BID, last creatinine was elevated. - Continue goal dose coreg 25 mg BID  - Continue lisinopril 2.5 mg BID, spiro 25 mg daily and hydralazine 50 mg TID and Imdur 60 mg daily.   - It is becoming more and more difficult to manage his fluid. He is not interested in an ICD which could help with looking at thoracic impedence and volume as well as protect him from SCD.  - Dr. Gala Romney discussed with patient that he thinks he is very high risk for hip surgery.  - Reinforced the need and importance of daily weights, a low sodium diet and to avoid soups, and fluid restriction (less than 2 L a day). Instructed to call the HF clinic if weight increases more than 3 lbs overnight or 5 lbs in a week.  2) Gout - Continue allopurinol. Continue to follow with rheumatology. Is now going to Pain management clinic for pain.  3) Atrial fibrillation-  Appears to be in NSR today. Continue BB and Eliquis. No s/s of bleeding.  4) Acute on Chronic renal failure, stage II-III - baseline Cr 1.46-1.8. Check BMET today.   5) Pre-operative CV risk stratification  F/U 1 month  Ulla Potash B  NP-C 10:09 AM  Patient seen and examined with Ulla Potash, NP. We  discussed all aspects of the encounter. I agree with the assessment and plan as stated above.   Difficult situation. He has severely reduced LV function and now renal function getting worse. I am worried this could be a low output state (vs overdiuresis). I am also concerned that he is at very high risk for adverse cardiac events with hip surgery particularly if he is low output. I think the next step is to check labs today and then proceed with RHC cath early next week to more clearly evaluate filling pressures and cardiac output. If output is normal I would feel a bit better about his operative risk. If output is low then this may be prohibitive.   Harjit Leider,MD 10:58 AM

## 2014-08-22 ENCOUNTER — Ambulatory Visit (INDEPENDENT_AMBULATORY_CARE_PROVIDER_SITE_OTHER): Payer: Commercial Managed Care - HMO | Admitting: Emergency Medicine

## 2014-08-22 ENCOUNTER — Encounter (HOSPITAL_COMMUNITY): Payer: Self-pay | Admitting: Emergency Medicine

## 2014-08-22 ENCOUNTER — Emergency Department (HOSPITAL_COMMUNITY)
Admission: EM | Admit: 2014-08-22 | Discharge: 2014-08-22 | Disposition: A | Discharge status: Home / Self Care | Payer: Medicare HMO | Attending: Emergency Medicine | Admitting: Emergency Medicine

## 2014-08-22 ENCOUNTER — Emergency Department (HOSPITAL_COMMUNITY): Payer: Medicare HMO

## 2014-08-22 VITALS — BP 106/80 | HR 68 | Temp 98.0°F | Resp 18 | Wt 236.0 lb

## 2014-08-22 DIAGNOSIS — M109 Gout, unspecified: Secondary | ICD-10-CM | POA: Diagnosis not present

## 2014-08-22 DIAGNOSIS — N189 Chronic kidney disease, unspecified: Secondary | ICD-10-CM

## 2014-08-22 DIAGNOSIS — Z8673 Personal history of transient ischemic attack (TIA), and cerebral infarction without residual deficits: Secondary | ICD-10-CM | POA: Diagnosis not present

## 2014-08-22 DIAGNOSIS — E669 Obesity, unspecified: Secondary | ICD-10-CM | POA: Diagnosis not present

## 2014-08-22 DIAGNOSIS — E119 Type 2 diabetes mellitus without complications: Secondary | ICD-10-CM | POA: Diagnosis not present

## 2014-08-22 DIAGNOSIS — Z8619 Personal history of other infectious and parasitic diseases: Secondary | ICD-10-CM | POA: Diagnosis not present

## 2014-08-22 DIAGNOSIS — J449 Chronic obstructive pulmonary disease, unspecified: Secondary | ICD-10-CM | POA: Diagnosis not present

## 2014-08-22 DIAGNOSIS — G589 Mononeuropathy, unspecified: Secondary | ICD-10-CM | POA: Insufficient documentation

## 2014-08-22 DIAGNOSIS — I129 Hypertensive chronic kidney disease with stage 1 through stage 4 chronic kidney disease, or unspecified chronic kidney disease: Secondary | ICD-10-CM | POA: Diagnosis not present

## 2014-08-22 DIAGNOSIS — G8929 Other chronic pain: Secondary | ICD-10-CM | POA: Diagnosis not present

## 2014-08-22 DIAGNOSIS — R34 Anuria and oliguria: Secondary | ICD-10-CM | POA: Insufficient documentation

## 2014-08-22 DIAGNOSIS — K2970 Gastritis, unspecified, without bleeding: Secondary | ICD-10-CM | POA: Insufficient documentation

## 2014-08-22 DIAGNOSIS — E860 Dehydration: Secondary | ICD-10-CM

## 2014-08-22 DIAGNOSIS — I2510 Atherosclerotic heart disease of native coronary artery without angina pectoris: Secondary | ICD-10-CM | POA: Insufficient documentation

## 2014-08-22 DIAGNOSIS — Z8739 Personal history of other diseases of the musculoskeletal system and connective tissue: Secondary | ICD-10-CM | POA: Diagnosis not present

## 2014-08-22 DIAGNOSIS — Z79899 Other long term (current) drug therapy: Secondary | ICD-10-CM | POA: Diagnosis not present

## 2014-08-22 DIAGNOSIS — I5023 Acute on chronic systolic (congestive) heart failure: Secondary | ICD-10-CM | POA: Diagnosis not present

## 2014-08-22 DIAGNOSIS — E1122 Type 2 diabetes mellitus with diabetic chronic kidney disease: Secondary | ICD-10-CM

## 2014-08-22 DIAGNOSIS — J3489 Other specified disorders of nose and nasal sinuses: Secondary | ICD-10-CM | POA: Diagnosis not present

## 2014-08-22 DIAGNOSIS — R112 Nausea with vomiting, unspecified: Secondary | ICD-10-CM

## 2014-08-22 DIAGNOSIS — N183 Chronic kidney disease, stage 3 (moderate): Secondary | ICD-10-CM | POA: Diagnosis not present

## 2014-08-22 DIAGNOSIS — Z87891 Personal history of nicotine dependence: Secondary | ICD-10-CM | POA: Insufficient documentation

## 2014-08-22 DIAGNOSIS — I4891 Unspecified atrial fibrillation: Secondary | ICD-10-CM | POA: Diagnosis not present

## 2014-08-22 DIAGNOSIS — G4730 Sleep apnea, unspecified: Secondary | ICD-10-CM | POA: Diagnosis not present

## 2014-08-22 DIAGNOSIS — R1013 Epigastric pain: Secondary | ICD-10-CM

## 2014-08-22 DIAGNOSIS — K219 Gastro-esophageal reflux disease without esophagitis: Secondary | ICD-10-CM | POA: Insufficient documentation

## 2014-08-22 DIAGNOSIS — Z9981 Dependence on supplemental oxygen: Secondary | ICD-10-CM | POA: Insufficient documentation

## 2014-08-22 DIAGNOSIS — Z9889 Other specified postprocedural states: Secondary | ICD-10-CM | POA: Diagnosis not present

## 2014-08-22 LAB — URINALYSIS, ROUTINE W REFLEX MICROSCOPIC
BILIRUBIN URINE: NEGATIVE
GLUCOSE, UA: NEGATIVE mg/dL
Hgb urine dipstick: NEGATIVE
KETONES UR: NEGATIVE mg/dL
Leukocytes, UA: NEGATIVE
Nitrite: NEGATIVE
PH: 6 (ref 5.0–8.0)
PROTEIN: NEGATIVE mg/dL
Specific Gravity, Urine: 1.015 (ref 1.005–1.030)
Urobilinogen, UA: 0.2 mg/dL (ref 0.0–1.0)

## 2014-08-22 LAB — CBC WITH DIFFERENTIAL/PLATELET
BASOS ABS: 0 10*3/uL (ref 0.0–0.1)
Basophils Relative: 0 % (ref 0–1)
Eosinophils Absolute: 0 10*3/uL (ref 0.0–0.7)
Eosinophils Relative: 0 % (ref 0–5)
HCT: 41.7 % (ref 39.0–52.0)
HEMOGLOBIN: 14 g/dL (ref 13.0–17.0)
LYMPHS ABS: 1.8 10*3/uL (ref 0.7–4.0)
Lymphocytes Relative: 18 % (ref 12–46)
MCH: 28.6 pg (ref 26.0–34.0)
MCHC: 33.6 g/dL (ref 30.0–36.0)
MCV: 85.3 fL (ref 78.0–100.0)
Monocytes Absolute: 1 10*3/uL (ref 0.1–1.0)
Monocytes Relative: 10 % (ref 3–12)
NEUTROS ABS: 7.4 10*3/uL (ref 1.7–7.7)
Neutrophils Relative %: 72 % (ref 43–77)
Platelets: 231 10*3/uL (ref 150–400)
RBC: 4.89 MIL/uL (ref 4.22–5.81)
RDW: 13.6 % (ref 11.5–15.5)
WBC: 10.3 10*3/uL (ref 4.0–10.5)

## 2014-08-22 LAB — COMPREHENSIVE METABOLIC PANEL
ALT: 22 U/L (ref 0–53)
AST: 34 U/L (ref 0–37)
Albumin: 4.5 g/dL (ref 3.5–5.2)
Alkaline Phosphatase: 125 U/L — ABNORMAL HIGH (ref 39–117)
Anion gap: 16 — ABNORMAL HIGH (ref 5–15)
BILIRUBIN TOTAL: 0.5 mg/dL (ref 0.3–1.2)
BUN: 33 mg/dL — ABNORMAL HIGH (ref 6–23)
CHLORIDE: 102 meq/L (ref 96–112)
CO2: 25 mEq/L (ref 19–32)
Calcium: 10.4 mg/dL (ref 8.4–10.5)
Creatinine, Ser: 1.6 mg/dL — ABNORMAL HIGH (ref 0.50–1.35)
GFR, EST AFRICAN AMERICAN: 51 mL/min — AB (ref >90)
GFR, EST NON AFRICAN AMERICAN: 44 mL/min — AB (ref >90)
GLUCOSE: 104 mg/dL — AB (ref 70–99)
Potassium: 4.4 mEq/L (ref 3.7–5.3)
Sodium: 143 mEq/L (ref 137–147)
Total Protein: 8.8 g/dL — ABNORMAL HIGH (ref 6.0–8.3)

## 2014-08-22 LAB — I-STAT CG4 LACTIC ACID, ED: LACTIC ACID, VENOUS: 2.4 mmol/L — AB (ref 0.5–2.2)

## 2014-08-22 LAB — LIPASE, BLOOD: Lipase: 14 U/L (ref 11–59)

## 2014-08-22 MED ORDER — PROMETHAZINE HCL 25 MG PO TABS
25.0000 mg | ORAL_TABLET | Freq: Four times a day (QID) | ORAL | Status: DC | PRN
Start: 2014-08-22 — End: 2014-09-04

## 2014-08-22 MED ORDER — DIPHENHYDRAMINE HCL 50 MG/ML IJ SOLN
12.5000 mg | Freq: Once | INTRAMUSCULAR | Status: AC
  Administered 2014-08-22: 12.5 mg via INTRAVENOUS
  Filled 2014-08-22: qty 1

## 2014-08-22 MED ORDER — MORPHINE SULFATE 4 MG/ML IJ SOLN
4.0000 mg | Freq: Once | INTRAMUSCULAR | Status: AC
  Administered 2014-08-22: 4 mg via INTRAVENOUS
  Filled 2014-08-22: qty 1

## 2014-08-22 MED ORDER — SODIUM CHLORIDE 0.9 % IV BOLUS (SEPSIS)
500.0000 mL | Freq: Once | INTRAVENOUS | Status: AC
  Administered 2014-08-22: 500 mL via INTRAVENOUS

## 2014-08-22 MED ORDER — METOCLOPRAMIDE HCL 5 MG/ML IJ SOLN
10.0000 mg | Freq: Once | INTRAMUSCULAR | Status: AC
  Administered 2014-08-22: 10 mg via INTRAVENOUS
  Filled 2014-08-22: qty 2

## 2014-08-22 MED ORDER — GI COCKTAIL ~~LOC~~
30.0000 mL | Freq: Once | ORAL | Status: AC
  Administered 2014-08-22: 30 mL via ORAL
  Filled 2014-08-22: qty 30

## 2014-08-22 NOTE — Progress Notes (Signed)
[facility] [address], [place] [address] [phone]  Date:  08/22/2014   Name:  Frank Davidson   DOB:  10-02-1951   MRN:  [id number]  PCP:  Abbe Amsterdam, MD    Chief Complaint: right side pain, Emesis and Fever   History of Present Illness:  Frank Davidson is a 63 y.o. very pleasant male patient who presents with the following:  Multiple chronic medical problems. Ill since Tuesday with nausea and vomiting.  Poor po intake.  Poor appetite.  No stool change. Has epigastric burning pain.  On protonix.  No vomiting blood.  The patient has no complaint of blood, mucous, or pus in her stools. No fever or chills.   History of "similar" a year ago and says was scoped and normal.   No NSAID or ASA use. No improvement with over the counter medications or other home remedies.  Denies other complaint or health concern today.   Patient Active Problem List   Diagnosis Date Noted  . Acute on chronic renal failure 08/08/2014  . Preop cardiovascular exam 08/08/2014  . Gout 04/29/2014  . Decreased pedal pulses 04/19/2014  . Acute esophagitis 12/24/2013  . Dysphagia, unspecified(787.20) 10/01/2013  . Diarrhea 10/01/2013  . Acute on chronic systolic heart failure   . Acute CHF 09/04/2013  . Chest pain 09/04/2013  . Ocular herpes   . Neuropathy   . Left-sided weakness   . Acute on chronic diastolic CHF (congestive heart failure), NYHA class 3 07/09/2013  . Acute ischemic stroke 07/09/2013  . OSA (obstructive sleep apnea) 07/09/2013  . Gout attack 07/07/2013  . Chronic systolic heart failure 07/05/2013  . Stroke 07/03/2013  . CVA (cerebral infarction) 07/03/2013  . A-fib 07/03/2013  . Hypokalemia 07/03/2013  . Hypertension   . COPD (chronic obstructive pulmonary disease)   . Diabetes mellitus without complication   . CKD (chronic kidney disease), stage III   . CHF (congestive heart failure)   . Nonischemic cardiomyopathy   . Polysubstance abuse      Past Medical History  Diagnosis Date  . Atrial fibrillation   . Hypertension   . COPD (chronic obstructive pulmonary disease)   . Diabetes mellitus without complication   . Coronary artery disease   . Neuropathy   . Ocular herpes   . Gout   . CHF (congestive heart failure)     EF 15%  . Chronic pain   . Hyperlipidemia   . Nonischemic cardiomyopathy   . Ocular herpes   . Neuropathy   . Left-sided weakness   . Acute on chronic systolic heart failure   . Stroke     x 5. last stroke aug 2014  . Decreased pedal pulses   . Memory impairment     "short term"  . CKD (chronic kidney disease), stage III     Dr. Briant Cedar follows-holding   . Fibromyalgia     neuropathy- hands, more than feet.  . Osteoarthritis   . Sleep apnea with use of continuous positive airway pressure (CPAP)     does not use cpap    Past Surgical History  Procedure Laterality Date  . 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 Cardiac catheterization  10/2006    normal coronary arteries  . Tonsillectomy  age 76  . Esophagogastroduodenoscopy N/A 12/24/2013    Procedure: ESOPHAGOGASTRODUODENOSCOPY (EGD);  Surgeon: Louis Meckel, MD;  Location: [facility];  Service: Endoscopy;  Laterality: N/A;  .  Colonoscopy N/A 12/24/2013    Procedure: COLONOSCOPY;  Surgeon: Louis Meckel, MD;  Location: [facility];  Service: Endoscopy;  Laterality: N/A;    History  Substance Use Topics  . Smoking status: Former Smoker -- 0.50 packs/day for 10 years    Types: Cigarettes    Quit date: 12/18/2006  . Smokeless tobacco: Never Used  . Alcohol Use: No     Comment: Quit 2008    Family History  Problem Relation Age of Onset  . Cancer Mother   . Hypertension Mother   . Lung disease Mother   . Diabetes Father   . Diabetes Sister   . Hypertension Sister   . Hypertension Brother   . Hypertension Sister   . Colon cancer Neg Hx     Allergies  Allergen Reactions  . Actos [Pioglitazone] Other (See Comments)    Caused  blood in urine and fluid retention   . Januvia [Sitagliptin]     "Almost killed me"     Medication list has been reviewed and updated.  Current Outpatient Prescriptions on File Prior to Visit  Medication Sig Dispense Refill  . allopurinol (ZYLOPRIM) 100 MG tablet Take 100 mg by mouth daily with breakfast.      . apixaban (ELIQUIS) 5 MG TABS tablet Take 5 mg by mouth 2 (two) times daily.      . Ascorbic Acid (VITAMIN C PO) Take 1 tablet by mouth daily.      . carvedilol (COREG) 25 MG tablet Take 0.5 tablets (12.5 mg total) by mouth 2 (two) times daily with a meal.      . colchicine 0.6 MG tablet Take 1.2 mg by mouth daily as needed (Gout).      . cyclobenzaprine (FLEXERIL) 5 MG tablet Take 2.5-5 mg by mouth at bedtime as needed for muscle spasms.      Marland Kitchen FLECTOR 1.3 % PTCH Place 0.5 patches onto the skin daily as needed (Pain).       Marland Kitchen glipiZIDE (GLUCOTROL) 5 MG tablet Take 5 mg by mouth 2 (two) times daily before a meal.      . Green Tea, Camillia sinensis, (GREEN TEA PO) Take 1 tablet by mouth daily.      . hydrALAZINE (APRESOLINE) 50 MG tablet Take 50 mg by mouth 2 (two) times daily.      Marland Kitchen HYDROcodone-acetaminophen (NORCO) 10-325 MG per tablet Take 1 tablet by mouth 3 (three) times daily as needed (Pain).      . isosorbide mononitrate (IMDUR) 60 MG 24 hr tablet Take 60 mg by mouth every morning.      Marland Kitchen lisinopril (PRINIVIL,ZESTRIL) 2.5 MG tablet Take 1 tablet (2.5 mg total) by mouth 2 (two) times daily.  60 tablet  3  . Menthol-Methyl Salicylate (MUSCLE RUB) 10-15 % CREA Apply 1 application topically 3 (three) times daily as needed for muscle pain.      . metolazone (ZAROXOLYN) 5 MG tablet Take 5 mg by mouth daily as needed (Swelling).       . pantoprazole (PROTONIX) 40 MG tablet Take 40 mg by mouth 2 (two) times daily.      . potassium chloride SA (K-DUR,KLOR-CON) 20 MEQ tablet Take 1 tablet (20 mEq total) by mouth daily.  30 tablet  6  . pregabalin (LYRICA) 75 MG capsule Take 75 mg by  mouth 2 (two) times daily as needed (pain).       Marland Kitchen spironolactone (ALDACTONE) 25 MG tablet Take 1 tablet (25 mg total) by  mouth daily.  30 tablet  3  . torsemide (DEMADEX) 20 MG tablet Take 2 tablets (40 mg total) by mouth 2 (two) times daily.  180 tablet  3   No current facility-administered medications on file prior to visit.    Review of Systems:  As per HPI, otherwise negative.    Physical Examination: Filed Vitals:   08/22/14 1312  BP: 106/80  Pulse: 68  Temp: 98 F (36.7 C)  Resp: 18   Filed Vitals:   08/22/14 1312  Weight: 236 lb (107.049 kg)   Body mass index is 33.86 kg/(m^2). Ideal Body Weight:    GEN: obese, NAD, Non-toxic, A & O x 3  Dry appearing HEENT: Atraumatic, Normocephalic. Neck supple. No masses, No LAD. Ears and Nose: No external deformity. CV: RRR, No M/G/R. No JVD. No thrill. No extra heart sounds. PULM: CTA B, no wheezes, crackles, rhonchi. No retractions. No resp. distress. No accessory muscle use. ABD: S, NT, ND, +BS. No rebound. No HSM. EXTR: No c/c/e NEURO assisted gait.  PSYCH: Normally interactive. Conversant. Not depressed or anxious appearing.  Calm demeanor.    Assessment and Plan: Abdominal pain Nausea and vomiting Unable to draw blood or establish effective IV access TO ER via EMS   Signed,  Phillips OdorJeffery Dajanique Robley, MD

## 2014-08-22 NOTE — Discharge Instructions (Signed)
Use phenergan as prescribed, as needed for nausea. Stay well hydrated with small sips of fluids throughout the day. Follow a BRAT (banana-rice-applesauce-toast) diet as described below for the next 24-48 hours. The 'BRAT' diet is suggested, then progress to diet as tolerated as symptoms abate. Call if bloody stools, persistent diarrhea, vomiting, fever or abdominal pain. Return to ER for changing or worsening of symptoms.  Food Choices to Help Relieve Diarrhea When you have diarrhea, the foods you eat and your eating habits are very important. Choosing the right foods and drinks can help relieve diarrhea. Also, because diarrhea can last up to 7 days, you need to replace lost fluids and electrolytes (such as sodium, potassium, and chloride) in order to help prevent dehydration.  WHAT GENERAL GUIDELINES DO I NEED TO FOLLOW?  Slowly drink 1 cup (8 oz) of fluid for each episode of diarrhea. If you are getting enough fluid, your urine will be clear or pale yellow.  Eat starchy foods. Some good choices include white rice, white toast, pasta, low-fiber cereal, baked potatoes (without the skin), saltine crackers, and bagels.  Avoid large servings of any cooked vegetables.  Limit fruit to two servings per day. A serving is  cup or 1 small piece.  Choose foods with less than 2 g of fiber per serving.  Limit fats to less than 8 tsp (38 g) per day.  Avoid fried foods.  Eat foods that have probiotics in them. Probiotics can be found in certain dairy products.  Avoid foods and beverages that may increase the speed at which food moves through the stomach and intestines (gastrointestinal tract). Things to avoid include:  High-fiber foods, such as dried fruit, raw fruits and vegetables, nuts, seeds, and whole grain foods.  Spicy foods and high-fat foods.  Foods and beverages sweetened with high-fructose corn syrup, honey, or sugar alcohols such as xylitol, sorbitol, and mannitol. WHAT FOODS ARE  RECOMMENDED? Grains White rice. White, [place], or pita breads (fresh or toasted), including plain rolls, buns, or bagels. White pasta. Saltine, soda, or graham crackers. Pretzels. Low-fiber cereal. Cooked cereals made with water (such as cornmeal, farina, or cream cereals). Plain muffins. Matzo. Melba toast. Zwieback.  Vegetables Potatoes (without the skin). Strained tomato and vegetable juices. Most well-cooked and canned vegetables without seeds. Tender lettuce. Fruits Cooked or canned applesauce, apricots, cherries, fruit cocktail, grapefruit, peaches, pears, or plums. Fresh bananas, apples without skin, cherries, grapes, cantaloupe, grapefruit, peaches, oranges, or plums.  Meat and Other Protein Products Baked or boiled chicken. Eggs. Tofu. Fish. Seafood. Smooth peanut butter. Ground or well-cooked tender beef, ham, veal, lamb, pork, or poultry.  Dairy Plain yogurt, kefir, and unsweetened liquid yogurt. Lactose-free milk, buttermilk, or soy milk. Plain hard cheese. Beverages Sport drinks. Clear broths. Diluted fruit juices (except prune). Regular, caffeine-free sodas such as ginger ale. Water. Decaffeinated teas. Oral rehydration solutions. Sugar-free beverages not sweetened with sugar alcohols. Other Bouillon, broth, or soups made from recommended foods.  The items listed above may not be a complete list of recommended foods or beverages. Contact your dietitian for more options. WHAT FOODS ARE NOT RECOMMENDED? Grains Whole grain, whole wheat, bran, or rye breads, rolls, pastas, crackers, and cereals. Wild or brown rice. Cereals that contain more than 2 g of fiber per serving. Corn tortillas or taco shells. Cooked or dry oatmeal. Granola. Popcorn. Vegetables Raw vegetables. Cabbage, broccoli, Brussels sprouts, artichokes, baked beans, beet greens, corn, kale, legumes, peas, sweet potatoes, and yams. Potato skins. Cooked spinach and cabbage. Fruits  Dried fruit, including raisins and dates.  Raw fruits. Stewed or dried prunes. Fresh apples with skin, apricots, mangoes, pears, raspberries, and strawberries.  Meat and Other Protein Products Chunky peanut butter. Nuts and seeds. Beans and lentils. Tomasa Blase.  Dairy High-fat cheeses. Milk, chocolate milk, and beverages made with milk, such as milk shakes. Cream. Ice cream. Sweets and Desserts Sweet rolls, doughnuts, and sweet breads. Pancakes and waffles. Fats and Oils Butter. Cream sauces. Margarine. Salad oils. Plain salad dressings. Olives. Avocados.  Beverages Caffeinated beverages (such as coffee, tea, soda, or energy drinks). Alcoholic beverages. Fruit juices with pulp. Prune juice. Soft drinks sweetened with high-fructose corn syrup or sugar alcohols. Other Coconut. Hot sauce. Chili powder. Mayonnaise. Gravy. Cream-based or milk-based soups.  The items listed above may not be a complete list of foods and beverages to avoid. Contact your dietitian for more information. WHAT SHOULD I DO IF I BECOME DEHYDRATED? Diarrhea can sometimes lead to dehydration. Signs of dehydration include dark urine and dry mouth and skin. If you think you are dehydrated, you should rehydrate with an oral rehydration solution. These solutions can be purchased at pharmacies, retail stores, or online.  Drink -1 cup (120-240 mL) of oral rehydration solution each time you have an episode of diarrhea. If drinking this amount makes your diarrhea worse, try drinking smaller amounts more often. For example, drink 1-3 tsp (5-15 mL) every 5-10 minutes.  A general rule for staying hydrated is to drink 1-2 L of fluid per day. Talk to your health care provider about the specific amount you should be drinking each day. Drink enough fluids to keep your urine clear or pale yellow. Document Released: 01/29/2004 Document Revised: 11/13/2013 Document Reviewed: 10/01/2013 [facility] Patient Information 2015 [place], [place]. This information is not intended to replace advice given  to you by your health care provider. Make sure you discuss any questions you have with your health care provider.   Abdominal Pain Many things can cause abdominal pain. Usually, abdominal pain is not caused by a disease and will improve without treatment. It can often be observed and treated at home. Your health care provider will do a physical exam and possibly order blood tests and X-rays to help determine the seriousness of your pain. However, in many cases, more time must pass before a clear cause of the pain can be found. Before that point, your health care provider may not know if you need more testing or further treatment. HOME CARE INSTRUCTIONS  Monitor your abdominal pain for any changes. The following actions may help to alleviate any discomfort you are experiencing:  Only take over-the-counter or prescription medicines as directed by your health care provider.  Do not take laxatives unless directed to do so by your health care provider.  Try a clear liquid diet (broth, tea, or water) as directed by your health care provider. Slowly move to a bland diet as tolerated. SEEK MEDICAL CARE IF:  You have unexplained abdominal pain.  You have abdominal pain associated with nausea or diarrhea.  You have pain when you urinate or have a bowel movement.  You experience abdominal pain that wakes you in the night.  You have abdominal pain that is worsened or improved by eating food.  You have abdominal pain that is worsened with eating fatty foods.  You have a fever. SEEK IMMEDIATE MEDICAL CARE IF:   Your pain does not go away within 2 hours.  You keep throwing up (vomiting).  Your pain is felt only  in portions of the abdomen, such as the right side or the left lower portion of the abdomen.  You pass bloody or black tarry stools. MAKE SURE YOU:  Understand these instructions.   Will watch your condition.   Will get help right away if you are not doing well or get worse.   Document Released: 08/18/2005 Document Revised: 11/13/2013 Document Reviewed: 07/18/2013 [facility] Patient Information 2015 [place], [place]. This information is not intended to replace advice given to you by your health care provider. Make sure you discuss any questions you have with your health care provider.   Nausea and Vomiting Nausea means you feel sick to your stomach. Throwing up (vomiting) is a reflex where stomach contents come out of your mouth. HOME CARE   Take medicine as told by your doctor.  Do not force yourself to eat. However, you do need to drink fluids.  If you feel like eating, eat a normal diet as told by your doctor.  Eat rice, wheat, potatoes, bread, lean meats, yogurt, fruits, and vegetables.  Avoid high-fat foods.  Drink enough fluids to keep your pee (urine) clear or pale yellow.  Ask your doctor how to replace body fluid losses (rehydrate). Signs of body fluid loss (dehydration) include:  Feeling very thirsty.  Dry lips and mouth.  Feeling dizzy.  Dark pee.  Peeing less than normal.  Feeling confused.  Fast breathing or heart rate. GET HELP RIGHT AWAY IF:   You have blood in your throw up.  You have black or bloody poop (stool).  You have a bad headache or stiff neck.  You feel confused.  You have bad belly (abdominal) pain.  You have chest pain or trouble breathing.  You do not pee at least once every 8 hours.  You have cold, clammy skin.  You keep throwing up after 24 to 48 hours.  You have a fever. MAKE SURE YOU:   Understand these instructions.  Will watch your condition.  Will get help right away if you are not doing well or get worse. Document Released: 04/26/2008 Document Revised: 01/31/2012 Document Reviewed: 04/09/2011 [facility] Patient Information 2015 [place], [place]. This information is not intended to replace advice given to you by your health care provider. Make sure you discuss any questions you have with your  health care provider. Dehydration, Adult Dehydration is when you lose more fluids from the body than you take in. Vital organs like the kidneys, brain, and heart cannot function without a proper amount of fluids and salt. Any loss of fluids from the body can cause dehydration.  CAUSES   Vomiting.  Diarrhea.  Excessive sweating.  Excessive urine output.  Fever. SYMPTOMS  Mild dehydration  Thirst.  Dry lips.  Slightly dry mouth. Moderate dehydration  Very dry mouth.  Sunken eyes.  Skin does not bounce back quickly when lightly pinched and released.  Dark urine and decreased urine production.  Decreased tear production.  Headache. Severe dehydration  Very dry mouth.  Extreme thirst.  Rapid, weak pulse (more than 100 beats per minute at rest).  Cold hands and feet.  Not able to sweat in spite of heat and temperature.  Rapid breathing.  Blue lips.  Confusion and lethargy.  Difficulty being awakened.  Minimal urine production.  No tears. DIAGNOSIS  Your caregiver will diagnose dehydration based on your symptoms and your exam. Blood and urine tests will help confirm the diagnosis. The diagnostic evaluation should also identify the cause of dehydration. TREATMENT  Treatment  of mild or moderate dehydration can often be done at home by increasing the amount of fluids that you drink. It is best to drink small amounts of fluid more often. Drinking too much at one time can make vomiting worse. Refer to the home care instructions below. Severe dehydration needs to be treated at the hospital where you will probably be given intravenous (IV) fluids that contain water and electrolytes. HOME CARE INSTRUCTIONS   Ask your caregiver about specific rehydration instructions.  Drink enough fluids to keep your urine clear or pale yellow.  Drink small amounts frequently if you have nausea and vomiting.  Eat as you normally do.  Avoid:  Foods or drinks high in  sugar.  Carbonated drinks.  Juice.  Extremely hot or cold fluids.  Drinks with caffeine.  Fatty, greasy foods.  Alcohol.  Tobacco.  Overeating.  Gelatin desserts.  Wash your hands well to avoid spreading bacteria and viruses.  Only take over-the-counter or prescription medicines for pain, discomfort, or fever as directed by your caregiver.  Ask your caregiver if you should continue all prescribed and over-the-counter medicines.  Keep all follow-up appointments with your caregiver. SEEK MEDICAL CARE IF:  You have abdominal pain and it increases or stays in one area (localizes).  You have a rash, stiff neck, or severe headache.  You are irritable, sleepy, or difficult to awaken.  You are weak, dizzy, or extremely thirsty. SEEK IMMEDIATE MEDICAL CARE IF:   You are unable to keep fluids down or you get worse despite treatment.  You have frequent episodes of vomiting or diarrhea.  You have blood or green matter (bile) in your vomit.  You have blood in your stool or your stool looks black and tarry.  You have not urinated in 6 to 8 hours, or you have only urinated a small amount of very dark urine.  You have a fever.  You faint. MAKE SURE YOU:   Understand these instructions.  Will watch your condition.  Will get help right away if you are not doing well or get worse. Document Released: 11/08/2005 Document Revised: 01/31/2012 Document Reviewed: 06/28/2011 [facility] Patient Information 2015 [place], [place]. This information is not intended to replace advice given to you by your health care provider. Make sure you discuss any questions you have with your health care provider.  Food Basics for Chronic Kidney Disease When your kidneys are not working well, they cannot remove waste and excess substances from your blood as effectively as they did before. This can lead to a buildup and imbalance of these substances, which can affect how your body functions. This  buildup can also make your kidneys work harder, causing even more damage. You may need to eat less of certain foods that can lead to the buildup of these substances in your body. By making the changes to your diet that are recommended by your dietitian or health care provider, you could possibly help prevent further kidney damage and delay or prevent the need for dialysis. The following information can help give you a basic understanding of these substances and how they affect your bodily functions. The information also gives examples of foods that contain the highest amounts of these substances. WHAT DO I NEED TO KNOW ABOUT SUBSTANCES IN MY FOOD THAT I MAY NEED TO ADJUST? Food adjustments will be different for each person with chronic kidney disease. It is important that you see a dietitian who can help you determine the specific adjustments that you will need  to make for each of the following substances: Potassium Potassium affects how steadily your heart beats. If too much potassium builds up in your blood, it can cause an irregular heartbeat or even a heart attack. Examples of foods rich in potassium include:  Milk.  Fruits.  Vegetables. Phosphorus Phosphorus is a mineral found in your bones. A balance between calcium and phosphorous is needed to build and maintain healthy bones. Too much phosphorus pulls calcium from your bones. This can make your bones weak and more likely to break. Too much phosphorus can also make your skin itch. Examples of foods rich in phosphorus include:  Milk and cheese.  Dried beans.  Peas.  Colas.  Nuts and peanut butter. Animal Protein Animal protein helps you make and keep muscle. It also helps in the repair of your body's cells and tissues. One of the natural breakdown products of protein is a waste product called urea. When your kidneys are not working properly, they cannot remove wastes such as urea like they did before you developed chronic kidney  disease. You will likely need to limit the amount of protein you eat to help prevent a buildup of urea in your blood. Examples of animal protein include:  Meat (all types).  Fish and seafood.  Poultry.  Eggs. Sodium Sodium, which is found in salt, helps maintain a healthy balance of fluids in your body. Too much sodium can increase your blood pressure level and have a negative affect on the function of your heart and lungs. Too much sodium also can cause your body to retain too much fluid, making your kidneys work harder. Examples of foods with high levels of sodium include:  Salt seasonings.  Soy sauce.  Cured and processed meats.  Salted crackers and snack foods.  Fast food.  Canned soups and most canned foods. Glucose Glucose provides energy for your body. If you have diabetes mellitus that is not properly controlled, you have too much glucose in your blood. Too much glucose in your blood can worsen the function of your kidneys by damaging small blood vessels. This prevents enough blood flow to your kidneys to give them what they need to work. If you have diabetes mellitus and chronic kidney disease, it is important to maintain your blood glucose at a level recommended by your health care provider. SHOULD I TAKE A VITAMIN AND MINERAL SUPPLEMENT? Because you may need to avoid eating certain foods, you may not get all of the vitamins and minerals that would normally come from those foods. Your health care provider or dietitian may recommend that you take a supplement to ensure that you get all of the vitamins and minerals that your body needs.  Document Released: 01/29/2003 Document Revised: 03/25/2014 Document Reviewed: 10/05/2013 [facility] Patient Information 2015 [place], [place]. This information is not intended to replace advice given to you by your health care provider. Make sure you discuss any questions you have with your health care provider.  Gastritis, Adult Gastritis is  soreness and swelling (inflammation) of the lining of the stomach. Gastritis can develop as a sudden onset (acute) or long-term (chronic) condition. If gastritis is not treated, it can lead to stomach bleeding and ulcers. CAUSES  Gastritis occurs when the stomach lining is weak or damaged. Digestive juices from the stomach then inflame the weakened stomach lining. The stomach lining may be weak or damaged due to viral or bacterial infections. One common bacterial infection is the Helicobacter pylori infection. Gastritis can also result from excessive  alcohol consumption, taking certain medicines, or having too much acid in the stomach.  SYMPTOMS  In some cases, there are no symptoms. When symptoms are present, they may include:  Pain or a burning sensation in the upper abdomen.  Nausea.  Vomiting.  An uncomfortable feeling of fullness after eating. DIAGNOSIS  Your caregiver may suspect you have gastritis based on your symptoms and a physical exam. To determine the cause of your gastritis, your caregiver may perform the following:  Blood or stool tests to check for the H pylori bacterium.  Gastroscopy. A thin, flexible tube (endoscope) is passed down the esophagus and into the stomach. The endoscope has a light and camera on the end. Your caregiver uses the endoscope to view the inside of the stomach.  Taking a tissue sample (biopsy) from the stomach to examine under a microscope. TREATMENT  Depending on the cause of your gastritis, medicines may be prescribed. If you have a bacterial infection, such as an H pylori infection, antibiotics may be given. If your gastritis is caused by too much acid in the stomach, H2 blockers or antacids may be given. Your caregiver may recommend that you stop taking aspirin, ibuprofen, or other nonsteroidal anti-inflammatory drugs (NSAIDs). HOME CARE INSTRUCTIONS  Only take over-the-counter or prescription medicines as directed by your caregiver.  If you were  given antibiotic medicines, take them as directed. Finish them even if you start to feel better.  Drink enough fluids to keep your urine clear or pale yellow.  Avoid foods and drinks that make your symptoms worse, such as:  Caffeine or alcoholic drinks.  Chocolate.  Peppermint or mint flavorings.  Garlic and onions.  Spicy foods.  Citrus fruits, such as oranges, lemons, or limes.  Tomato-based foods such as sauce, chili, salsa, and pizza.  Fried and fatty foods.  Eat small, frequent meals instead of large meals. SEEK IMMEDIATE MEDICAL CARE IF:   You have black or dark red stools.  You vomit blood or material that looks like coffee grounds.  You are unable to keep fluids down.  Your abdominal pain gets worse.  You have a fever.  You do not feel better after 1 week.  You have any other questions or concerns. MAKE SURE YOU:  Understand these instructions.  Will watch your condition.  Will get help right away if you are not doing well or get worse. Document Released: 11/02/2001 Document Revised: 05/09/2012 Document Reviewed: 12/22/2011 [facility] Patient Information 2015 [place], [place]. This information is not intended to replace advice given to you by your health care provider. Make sure you discuss any questions you have with your health care provider.

## 2014-08-22 NOTE — ED Provider Notes (Signed)
CSN: [id number]     Arrival date & time 08/22/14  1559 History   First MD Initiated Contact with Patient 08/22/14 1604     No chief complaint on file.    (Consider location/radiation/quality/duration/timing/severity/associated sxs/prior Treatment) HPI Comments: KEITH FELTEN is a 63 y.o. 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 Pt states the pain is 9/10, constant, located in his epigastric and upper abdomen but is sometimes generalized and occasionally radiates to his R flank area, worse with eating, improved with lying on his R side and with baking soda water solution, and unrelieved with Pepto bismol and gingerale. Endorses nonbloody nonbilious emesis, unable to tolerate any food or water intake since Tuesday, last able to tolerate medications yesterday morning (typically takes them twice daily). Takes 3 percocets daily for chronic OA pain, and takes Protonix for GERD. States he ate lasagna from the grocery store and this "didn't sit well", and caused his current symptoms. Endorses feeling weak and lightheaded with standing, as well as decreased urine output from normal, and mild rhinorrhea over the last few days but otherwise denies any URI symptoms. Denies fevers, chills, CP, SOB, cough, PND, worsening orthopnea, LE swelling, hematemesis, coffee ground emesis, constipation, diarrhea, hematochezia, melena, hematuria, dysuria, myalgias, arthralgias, back pain, syncope, dizziness, vertigo, or rashes. Denies sick contacts or recent travel, denies recent antibiotics. Last BM 36hrs ago, decreased flatus but still having passage of flatus over course of illness. States this feels similar to last yr when he was  scoped and dx'd with esophagitis.   Patient is a 63 y.o. male presenting with vomiting and abdominal pain. The history is provided by the patient. No language interpreter was used.  Emesis Severity:  Severe Duration:  2 days Timing:  Constant Quality:  Stomach contents and undigested food Progression:  Unchanged Chronicity:  New Recent urination:  Decreased Relieved by:  Nothing Exacerbated by: solids and liquids. Ineffective treatments:  Antiemetics and liquids (pepto bismol, zofran (by EMS), and gingerale) Associated symptoms: abdominal pain   Associated symptoms: no arthralgias, no chills, no cough, no diarrhea, no fever, no headaches, no myalgias, no sore throat and no URI   Risk factors: diabetes and suspect food intake   Risk factors: no alcohol use, no prior abdominal surgery, no sick contacts and no travel to endemic areas   Abdominal Pain Pain location:  Epigastric and R flank (occasionally generalized) Pain quality: burning and gnawing   Pain radiates to:  Does not radiate Pain severity:  Moderate (9/10) Onset quality:  Gradual Duration:  2 days Timing:  Constant Progression:  Unchanged Chronicity:  Recurrent (similar episode last year, EGD clear, dx'd with esophagitis) Context: eating and suspicious food intake   Context: not alcohol use, not previous surgeries, not recent travel and not sick contacts   Relieved by:  Position changes and OTC medications (baking soda water and laying on R side) Worsened by:  Eating Ineffective treatments:  Antacids and liquids (pepto and gingerale) Associated symptoms: anorexia, fatigue, nausea and vomiting   Associated symptoms: no belching, no chest pain, no chills, no constipation, no cough, no diarrhea, no dysuria, no fever, no flatus, no hematemesis, no hematochezia, no hematuria, no melena, no shortness of breath and  no sore throat   Risk factors: obesity   Risk factors: no alcohol abuse and no NSAID use     Past Medical  History  Diagnosis Date  . Atrial fibrillation   . Hypertension   . COPD (chronic obstructive pulmonary disease)   . Diabetes mellitus without complication   . Coronary artery disease   . Neuropathy   . Ocular herpes   . Gout   . CHF (congestive heart failure)     EF 15%  . Chronic pain   . Hyperlipidemia   . Nonischemic cardiomyopathy   . Ocular herpes   . Neuropathy   . Left-sided weakness   . Acute on chronic systolic heart failure   . Stroke     x 5. last stroke aug 2014  . Decreased pedal pulses   . Memory impairment     "short term"  . CKD (chronic kidney disease), stage III     Dr. Mercy Moore follows-holding   . Fibromyalgia     neuropathy- hands, more than feet.  . Osteoarthritis   . Sleep apnea with use of continuous positive airway pressure (CPAP)     does not use cpap   Past Surgical History  Procedure Laterality Date  . 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 Cardiac catheterization  10/2006    normal coronary arteries  . Tonsillectomy  age 66  . Esophagogastroduodenoscopy N/A 12/24/2013    Procedure: ESOPHAGOGASTRODUODENOSCOPY (EGD);  Surgeon: Inda Castle, MD;  Location: [facility];  Service: Endoscopy;  Laterality: N/A;  . Colonoscopy N/A 12/24/2013    Procedure: COLONOSCOPY;  Surgeon: Inda Castle, MD;  Location: [facility];  Service: Endoscopy;  Laterality: N/A;   Family History  Problem Relation Age of Onset  . Cancer Mother   . Hypertension Mother   . Lung disease Mother   . Diabetes Father   . Diabetes Sister   . Hypertension Sister   . Hypertension Brother   . Hypertension Sister   . Colon cancer Neg Hx    History  Substance Use Topics  . Smoking status: Former Smoker -- 0.50 packs/day for 10 years    Types: Cigarettes    Quit date: 12/18/2006  . Smokeless tobacco: Never Used  . Alcohol Use: No     Comment: Quit 2008    Review of Systems  Constitutional: Positive for appetite change (decreased) and fatigue. Negative for fever,  chills and diaphoresis.  HENT: Positive for rhinorrhea (mild). Negative for sore throat.   Respiratory: Negative for cough, chest tightness and shortness of breath.   Cardiovascular: Negative for chest pain, palpitations and leg swelling.  Gastrointestinal: Positive for nausea, vomiting, abdominal pain and anorexia. Negative for diarrhea, constipation, blood in stool, melena, hematochezia, abdominal distention, rectal pain, flatus and hematemesis.  Genitourinary: Positive for flank pain (Right side lateral abd) and decreased urine volume. Negative for dysuria, urgency, frequency, hematuria and difficulty urinating.  Musculoskeletal: Negative for arthralgias, back pain and myalgias.  Skin: Negative for color change.  Allergic/Immunologic: Positive for immunocompromised state.  Neurological: Positive for light-headedness (when standing). Negative for dizziness, syncope, weakness and headaches.  Psychiatric/Behavioral: Negative for confusion.    10 Systems reviewed and are negative for acute change except as noted in the HPI.   Allergies  Actos and Januvia  Home Medications   Prior to Admission medications   Medication Sig Start Date End Date Taking? Authorizing Provider  allopurinol (ZYLOPRIM) 100 MG tablet Take 100 mg by mouth  daily with breakfast. 11/01/13   Timoteo Gaul, FNP  apixaban (ELIQUIS) 5 MG TABS tablet Take 5 mg by mouth 2 (two) times daily. 11/01/13   Timoteo Gaul, FNP  Ascorbic Acid (VITAMIN C PO) Take 1 tablet by mouth daily.    Historical Provider, MD  carvedilol (COREG) 25 MG tablet Take 0.5 tablets (12.5 mg total) by mouth 2 (two) times daily with a meal. 08/09/14   Jolaine Artist, MD  colchicine 0.6 MG tablet Take 1.2 mg by mouth daily as needed (Gout).    Historical Provider, MD  cyclobenzaprine (FLEXERIL) 5 MG tablet Take 2.5-5 mg by mouth at bedtime as needed for muscle spasms.    Historical Provider, MD  FLECTOR 1.3 % PTCH Place 0.5 patches onto the  skin daily as needed (Pain).  03/21/14   Historical Provider, MD  glipiZIDE (GLUCOTROL) 5 MG tablet Take 5 mg by mouth 2 (two) times daily before a meal. 11/01/13   Timoteo Gaul, FNP  Green Tea, Camillia sinensis, (GREEN TEA PO) Take 1 tablet by mouth daily.    Historical Provider, MD  hydrALAZINE (APRESOLINE) 50 MG tablet Take 50 mg by mouth 2 (two) times daily.    Historical Provider, MD  HYDROcodone-acetaminophen (NORCO) 10-325 MG per tablet Take 1 tablet by mouth 3 (three) times daily as needed (Pain).    Historical Provider, MD  isosorbide mononitrate (IMDUR) 60 MG 24 hr tablet Take 60 mg by mouth every morning.    Historical Provider, MD  lisinopril (PRINIVIL,ZESTRIL) 2.5 MG tablet Take 1 tablet (2.5 mg total) by mouth 2 (two) times daily. 07/16/14   Rande Brunt, NP  Menthol-Methyl Salicylate (MUSCLE RUB) 10-15 % CREA Apply 1 application topically 3 (three) times daily as needed for muscle pain.    Historical Provider, MD  metolazone (ZAROXOLYN) 5 MG tablet Take 5 mg by mouth daily as needed (Swelling).  05/13/14   Amy D Clegg, NP  pantoprazole (PROTONIX) 40 MG tablet Take 40 mg by mouth 2 (two) times daily.    Historical Provider, MD  potassium chloride SA (K-DUR,KLOR-CON) 20 MEQ tablet Take 1 tablet (20 mEq total) by mouth daily. 04/22/14   Gay Filler Copland, MD  pregabalin (LYRICA) 75 MG capsule Take 75 mg by mouth 2 (two) times daily as needed (pain).     Historical Provider, MD  spironolactone (ALDACTONE) 25 MG tablet Take 1 tablet (25 mg total) by mouth daily. 05/21/14   Rande Brunt, NP  torsemide (DEMADEX) 20 MG tablet Take 2 tablets (40 mg total) by mouth 2 (two) times daily. 07/05/14   Rande Brunt, NP   BP 147/74  Pulse 69  Temp(Src) 98.4 F (36.9 C) (Oral)  Resp 18  Ht _0  (1.803 m)  Wt 236 lb (107.049 kg)  BMI 32.93 kg/m2  SpO2 98% Physical Exam  Nursing note and vitals reviewed. Constitutional: He is oriented to person, place, and time. Vital signs are normal.  He appears well-developed.  Non-toxic appearance. No distress.  Afebrile, nontoxic, NAD, obese AAM  HENT:  Head: Normocephalic and atraumatic.  Nose: Nose normal.  Mouth/Throat: Oropharynx is clear and moist. Mucous membranes are dry.  Mildly dry mucous membranes  Eyes: Conjunctivae and EOM are normal. Right eye exhibits no discharge. Left eye exhibits no discharge.  Neck: Normal range of motion. Neck supple. No JVD present.  Cardiovascular: Normal rate, regular rhythm, normal heart sounds and intact distal pulses.  Exam reveals no gallop and no friction rub.  No murmur heard. Pulses:      Dorsalis pedis pulses are 1+ on the right side, and 1+ on the left side.       Posterior tibial pulses are 1+ on the right side, and 1+ on the left side.  Body habitus limits exam but RRR, no m/r/g LE pulses diminished bilaterally at baseline  Pulmonary/Chest: Effort normal and breath sounds normal. No respiratory distress. He has no decreased breath sounds. He has no wheezes. He has no rhonchi. He has no rales.  CTAB in all lung fields  Abdominal: Soft. Normal appearance and bowel sounds are normal. He exhibits no distension and no ascites. There is generalized tenderness. There is no rigidity, no rebound, no guarding, no CVA tenderness, no tenderness at McBurney's point and negative Murphy's sign.    Obese abdomen limits exam Soft, nondistended, +BS throughout without high pitched tinkling or abnormal sounds Diffuse TTP but mostly localized in the epigastric area, no r/g/r, neg murphy's, neg mcburney's, no CVA TTP  Musculoskeletal: Normal range of motion.  No pedal edema  Neurological: He is alert and oriented to person, place, and time. He has normal strength. No sensory deficit.  Skin: Skin is warm, dry and intact. No rash noted.  Psychiatric: He has a normal mood and affect.    ED Course  Procedures (including critical care time) Labs Review Labs Reviewed  COMPREHENSIVE METABOLIC PANEL -  Abnormal; Notable for the following:    Glucose, Bld 104 (*)    BUN 33 (*)    Creatinine, Ser 1.60 (*)    Total Protein 8.8 (*)    Alkaline Phosphatase 125 (*)    GFR calc non Af Amer 44 (*)    GFR calc Af Amer 51 (*)    Anion gap 16 (*)    All other components within normal limits  I-STAT CG4 LACTIC ACID, ED - Abnormal; Notable for the following:    Lactic Acid, Venous 2.40 (*)    All other components within normal limits  CBC WITH DIFFERENTIAL  LIPASE, BLOOD  URINALYSIS, ROUTINE W REFLEX MICROSCOPIC    Imaging Review No results found.   EKG Interpretation None    EKG: artifact, but unremarkable  MDM   Final diagnoses:  Epigastric pain  Non-intractable vomiting with nausea, vomiting of unspecified type  CKD (chronic kidney disease), stage 3 (moderate)  Dehydration  Gastritis    63y/o male with multiple medical problems here with N/V and epigastric abd pain x2 days. Could be food related given suspicious food intake recently. Given medical comorbidities, could be mesenteric ischemia. Doubt kidney stone. Could be lyte imbalance related. Doubt obstruction but given hx of decreased flatus and no BM x36hrs will obtain AAS xray. Will obtain labs. Will give pain meds and antiemetics, GI cocktail, and gentle rehydration. Will reassess shortly.  5:44 PM Lactic acid 2.4, likely related to dehydration vs intestinal ischemia. CBC w/diff unremarkable. CMP with BUN 33, Cr 1.6, GFR 51 which is improved from prior lab values. Alk phos mildly elevated, likely related to gastritis/vomiting. Lipase WNL. Acute abd series unremarkable for perf or obstruction, some gas within small bowel and colon but no distension or constipation. Given unremarkable labs, doubt need for emergent CT at this time. Pt feeling better, pain decreased to 5/10 and no ongoing nausea/emesis, will PO challenge now.   7:32 PM Pain improved to 2/10. Nausea gone, no emesis, tolerating PO well. U/A neg. Will rx phenergan and  have him f/up with PCP in 1 wk.  Likely viral or food related gastritis. Discussed foods to avoid and staying hydrated with small sips. I explained the diagnosis and have given explicit precautions to return to the ER including for any other new or worsening symptoms. The patient understands and accepts the medical plan as it's been dictated and I have answered their questions. Discharge instructions concerning home care and prescriptions have been given. The patient is STABLE and is discharged to home in good condition.  BP 149/90  Pulse 68  Temp(Src) 98.4 F (36.9 C) (Oral)  Resp 10  Ht _0  (1.803 m)  Wt 236 lb (107.049 kg)  BMI 32.93 kg/m2  SpO2 100%  Meds ordered this encounter  Medications  . sodium chloride 0.9 % bolus 500 mL    Sig:   . metoCLOPramide (REGLAN) injection 10 mg    Sig:   . diphenhydrAMINE (BENADRYL) injection 12.5 mg    Sig:   . morphine 4 MG/ML injection 4 mg    Sig:   . gi cocktail (Maalox,Lidocaine,Donnatal)    Sig:   . promethazine (PHENERGAN) 25 MG tablet    Sig: Take 1 tablet (25 mg total) by mouth every 6 (six) hours as needed for nausea or vomiting.    Dispense:  10 tablet    Refill:  0    Order Specific Question:  Supervising Provider    Answer:  Johnna Acosta [address] [place], PA-C 08/22/14 1934

## 2014-08-22 NOTE — ED Provider Notes (Signed)
Medical screening examination/treatment/procedure(s) were conducted as a shared visit with non-physician practitioner(s) and myself.  I personally evaluated the patient during the encounter.      Date: 08/22/2014 18:13  Rate: 64  Rhythm: normal sinus rhythm  QRS Axis: normal  Intervals: First degree AV block, borderline prolonged QTC  ST/T Wave abnormalities: normal  Conduction Disutrbances: none  Narrative Interpretation: Low voltage QRS, borderline prolonged QTC, first degree A-V block, no significant change compared to prior EKG    Patient here with significant cardiac history with nausea, vomiting and diarrhea and diffuse crampy abdominal pain for several days. On exam, patient is hemodynamically stable, heart lung sounds normal. No signs of volume overload. He initially appear dry on exam. Abdomen is soft and mildly tender to palpation diffusely without focality. Suspect viral gastritis. He has tolerated by mouth. Symptoms have been controlled. No chest pain or shortness of breath. EKG shows no significant changes. Labs have been unremarkable other than mild elevation of his troponin which is likely due to dehydration. He did receive a small amount of IV fluids in the ED. We'll discharge with prescription for anti-emetics and instructions to use over-the-counter antidiarrheal medications. Discussed return precautions with patient and family.  Layla Maw Moises Terpstra, DO 08/22/14 1827

## 2014-08-22 NOTE — ED Notes (Signed)
Patient states abdominal pain with n/v x 2 days, patient sent from [facility], patient received Zofran 4 mg pta per EMS

## 2014-08-26 ENCOUNTER — Telehealth: Payer: Self-pay

## 2014-08-26 NOTE — Telephone Encounter (Signed)
° ° °  Patient is requesting Dr. Patsy Lager to write a script to [facility] on [address] on promethazine (PHENERGAN) 25 MG tablet   [phone]

## 2014-08-27 NOTE — Telephone Encounter (Signed)
Called and LMOM.  It looks like he received this because he was fairly ill.  If he is still sick he will need to be seen prior to rx this medication.  Please call, come in, or go to the ER if he is not ok.  Otherwise I am in the office tomorrow and Thursday and would be glad to see him

## 2014-08-28 ENCOUNTER — Telehealth: Payer: Self-pay

## 2014-08-28 NOTE — Telephone Encounter (Signed)
LM for pt: Pt needs to be seen in clinic again. He was very ill and needs to be evaluated if he needs a refill on this medication.

## 2014-08-28 NOTE — Telephone Encounter (Signed)
Patient states he needs more nausea medication he was prescribed last Thursday from [facility] emergency room. Please advise at [phone].

## 2014-09-03 ENCOUNTER — Telehealth (HOSPITAL_COMMUNITY): Payer: Self-pay | Admitting: Vascular Surgery

## 2014-09-03 ENCOUNTER — Other Ambulatory Visit: Payer: Self-pay | Admitting: Family

## 2014-09-03 DIAGNOSIS — E1169 Type 2 diabetes mellitus with other specified complication: Secondary | ICD-10-CM

## 2014-09-03 DIAGNOSIS — E669 Obesity, unspecified: Principal | ICD-10-CM

## 2014-09-03 NOTE — Telephone Encounter (Signed)
Pt called... needs Dr. Gala Romney to contact Dr.Olin with approval for hip surgery Oct 22 .Marland Kitchen Please advise

## 2014-09-04 ENCOUNTER — Encounter (HOSPITAL_COMMUNITY): Payer: Self-pay | Admitting: Pharmacy Technician

## 2014-09-04 ENCOUNTER — Encounter (HOSPITAL_COMMUNITY): Payer: Self-pay

## 2014-09-04 ENCOUNTER — Ambulatory Visit (HOSPITAL_COMMUNITY)
Admission: RE | Admit: 2014-09-04 | Discharge: 2014-09-04 | Disposition: A | Discharge status: Home / Self Care | Payer: Medicare HMO | Source: Ambulatory Visit | Attending: Cardiology | Admitting: Cardiology

## 2014-09-04 VITALS — BP 96/68 | HR 70 | Wt 244.4 lb

## 2014-09-04 DIAGNOSIS — I5022 Chronic systolic (congestive) heart failure: Secondary | ICD-10-CM | POA: Insufficient documentation

## 2014-09-04 DIAGNOSIS — I429 Cardiomyopathy, unspecified: Secondary | ICD-10-CM | POA: Insufficient documentation

## 2014-09-04 DIAGNOSIS — N183 Chronic kidney disease, stage 3 unspecified: Secondary | ICD-10-CM

## 2014-09-04 DIAGNOSIS — M109 Gout, unspecified: Secondary | ICD-10-CM | POA: Diagnosis not present

## 2014-09-04 DIAGNOSIS — J449 Chronic obstructive pulmonary disease, unspecified: Secondary | ICD-10-CM | POA: Insufficient documentation

## 2014-09-04 DIAGNOSIS — I4891 Unspecified atrial fibrillation: Secondary | ICD-10-CM | POA: Insufficient documentation

## 2014-09-04 DIAGNOSIS — I2510 Atherosclerotic heart disease of native coronary artery without angina pectoris: Secondary | ICD-10-CM | POA: Diagnosis not present

## 2014-09-04 DIAGNOSIS — Z01810 Encounter for preprocedural cardiovascular examination: Secondary | ICD-10-CM | POA: Diagnosis not present

## 2014-09-04 DIAGNOSIS — Z7901 Long term (current) use of anticoagulants: Secondary | ICD-10-CM | POA: Insufficient documentation

## 2014-09-04 DIAGNOSIS — G4730 Sleep apnea, unspecified: Secondary | ICD-10-CM | POA: Insufficient documentation

## 2014-09-04 DIAGNOSIS — E1140 Type 2 diabetes mellitus with diabetic neuropathy, unspecified: Secondary | ICD-10-CM | POA: Insufficient documentation

## 2014-09-04 DIAGNOSIS — N179 Acute kidney failure, unspecified: Secondary | ICD-10-CM | POA: Insufficient documentation

## 2014-09-04 DIAGNOSIS — I69354 Hemiplegia and hemiparesis following cerebral infarction affecting left non-dominant side: Secondary | ICD-10-CM | POA: Insufficient documentation

## 2014-09-04 DIAGNOSIS — I129 Hypertensive chronic kidney disease with stage 1 through stage 4 chronic kidney disease, or unspecified chronic kidney disease: Secondary | ICD-10-CM | POA: Diagnosis not present

## 2014-09-04 DIAGNOSIS — I509 Heart failure, unspecified: Secondary | ICD-10-CM | POA: Diagnosis present

## 2014-09-04 DIAGNOSIS — E785 Hyperlipidemia, unspecified: Secondary | ICD-10-CM | POA: Diagnosis not present

## 2014-09-04 DIAGNOSIS — Z79899 Other long term (current) drug therapy: Secondary | ICD-10-CM | POA: Diagnosis not present

## 2014-09-04 NOTE — Patient Instructions (Signed)
Your physician recommends that you schedule a follow-up appointment in: 3 months  Do the following things EVERYDAY: 1) Weigh yourself in the morning before breakfast. Write it down and keep it in a log. 2) Take your medicines as prescribed 3) Eat low salt foods-Limit salt (sodium) to 2000 mg per day.  4) Stay as active as you can everyday 5) Limit all fluids for the day to less than 2 liters 6)   

## 2014-09-04 NOTE — Progress Notes (Addendum)
Patient ID: Frank Davidson, male   DOB: May 18, 1951, 63 y.o.   MRN: [id number]  PCP: Adline Mango FNP GI: Dr Yves Dill Nephrologist: Dr. Briant Cedar  HPI: Mr. Matney is a 63 yo male with a hx of obesity, PAF, HTN, COPD, HTN, gout, DM 2, CVA and CHF due to NICM.  Has previously followed in the [facility] in [place]. Previous cath in 2007 showed NICM with normal cors. Was living alone in [place] and apparently had issues managing medications by himself and was getting confused with PCP and cardiologists medications and was not taking his meds correctly. He moved to [place] to be near his nieces. Issues in the past with ACE-I due to renal failure. Highest creatinine in our system is ~1.5 but was apparently higher previously - thought to be related to taking his medications incorrectly.   Was admitted in 06/2013 from [facility] with left sided weakness and was found to have a moderate to large right hemispheric infarct. Went back to [facility].   Admitted to [facility] 10/14 through 09/09/13 with ADHF. Diuresed 30 pounds with IV lasix and transitioned to lasix 80 mg po bid. Continued on 25 mg carvedilol bid, isordil 20 mg daily.  Started on lisinopril 2.5 mg daily. Discharge weight 229 pounds. 09/29/13 CT abdomen and pelvis - no obstruction . Diverticulosis. No inflammatory changes.   Follow up for Heart Failure:  Since last visit underwent RHC which showed stable hemodynamics and filling pressures. He fills good from a cardiac standpoint. Edema well controlled on demadex 20 bid. Saw Renal on Monday and creatinine back down to 1.6. Weight now stable. Continues with very severe L hip pain and wants to have hip replacement.  ECHO 11/08/13 EF 15%  ECHO 07/05/14: EF 15%   08/13/14 RA = 7  RV = 50/3/10  PA = 54/22 (34)  PCW = 16  Fick cardiac output/index = 6.9/3.0  Thermo CO/CI = 4.7/2.1  PVR = 4.1 WU  Ao sat = 94%  PA sat = 68%,68%  SVC sat (sheath) = 62%   Labs 10/01/13 Dig level 2.5  --->dig stopped Labs 10/05/13 K 4.8 Creatinine 1.79 Labs 11/01/13 K 3.3 --Supplemented Creatinine 1.3 Uric Acid 9.3 AST 23 ALT 12 Hemoglobin 13 Hemoglobin 7.2  Labs 11/21/13 K 4.4 Creatinine 1.3            01/02/14 K 4.2, Creatinine 1.1            01/10/14 K 4.6, Creatinine 1.28           04/22/14 K 4.0 Creatinine 1.58         05/13/14 K 4.3, creatinine 1.46, pro-BNP 1377          06/24/14: K 3.9, creatinine 1.49         07/02/14 K 4.5, creatinine 1.82, pro-BNP 696.7         07/16/14 K 3.7, creatinine 2.41         08/01/14 K 4.3 creatinine 2.6 (demadex held for 2 days) - then restarted at 40 bid (baseline dose)         08/22/14 K 4.4 creatinine 1.6         09/02/14 Creatinine 1.6   SH: Retired Engineer, 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  Past Medical History  Diagnosis Date  . Atrial fibrillation   . Hypertension   . COPD (chronic  obstructive pulmonary disease)   . Diabetes mellitus without complication   . Coronary artery disease   . Neuropathy   . Ocular herpes   . Gout   . CHF (congestive heart failure)     EF 15%  . Chronic pain   . Hyperlipidemia   . Nonischemic cardiomyopathy   . Ocular herpes   . Neuropathy   . Left-sided weakness   . Acute on chronic systolic heart failure   . Stroke     x 5. last stroke aug 2014  . Decreased pedal pulses   . Memory impairment     "short term"  . CKD (chronic kidney disease), stage III     Dr. Briant CedarMattingly follows-holding   . Fibromyalgia     neuropathy- hands, more than feet.  . Osteoarthritis   . Sleep apnea with use of continuous positive airway pressure (CPAP)     does not use cpap    Current Outpatient Prescriptions  Medication Sig Dispense Refill  . allopurinol (ZYLOPRIM) 100 MG tablet Take 100 mg by mouth daily with breakfast.      . apixaban (ELIQUIS) 5 MG TABS tablet Take 5 mg by mouth 2 (two) times daily.      .  Ascorbic Acid (VITAMIN C PO) Take 1 tablet by mouth daily.      . baclofen (LIORESAL) 10 MG tablet Take 10 mg by mouth every 8 (eight) hours as needed for muscle spasms.      Marland Kitchen. bismuth subsalicylate (PEPTO BISMOL) 262 MG/15ML suspension Take 15 mLs by mouth every 6 (six) hours as needed for indigestion or diarrhea or loose stools.      . carvedilol (COREG) 25 MG tablet Take 12.5 mg by mouth 2 (two) times daily with a meal.       . colchicine 0.6 MG tablet Take 1.2 mg by mouth daily as needed (Gout).      Marland Kitchen. FLECTOR 1.3 % PTCH Place 0.5 patches onto the skin daily as needed (Pain).       Marland Kitchen. glipiZIDE (GLUCOTROL) 5 MG tablet TAKE 1 TABLET (5 MG TOTAL) BY MOUTH 2 (TWO) TIMES DAILY BEFORE A MEAL.  60 tablet  5  . Green Tea, Camillia sinensis, (GREEN TEA PO) Take 1 tablet by mouth daily.      . hydrALAZINE (APRESOLINE) 50 MG tablet Take 50 mg by mouth 2 (two) times daily.      . isosorbide mononitrate (IMDUR) 60 MG 24 hr tablet Take 60 mg by mouth every morning.      Marland Kitchen. lisinopril (PRINIVIL,ZESTRIL) 2.5 MG tablet Take 1 tablet (2.5 mg total) by mouth 2 (two) times daily.  60 tablet  3  . Menthol-Methyl Salicylate (MUSCLE RUB) 10-15 % CREA Apply 1 application topically 3 (three) times daily as needed for muscle pain.      . metolazone (ZAROXOLYN) 5 MG tablet Take 5 mg by mouth daily as needed (Swelling).       Marland Kitchen. oxyCODONE-acetaminophen (PERCOCET) 10-325 MG per tablet Take 1 tablet by mouth every 8 (eight) hours as needed for pain.      . pantoprazole (PROTONIX) 40 MG tablet Take 40 mg by mouth 2 (two) times daily.      . potassium chloride SA (K-DUR,KLOR-CON) 20 MEQ tablet Take 1 tablet (20 mEq total) by mouth daily.  30 tablet  6  . pregabalin (LYRICA) 150 MG capsule Take 150-300 mg by mouth 2 (two) times daily. Take 150 mg by mouth in the morning and  take 300 mg by mouth at night.      . promethazine (PHENERGAN) 25 MG tablet Take 1 tablet (25 mg total) by mouth every 6 (six) hours as needed for nausea or  vomiting.  10 tablet  0  . spironolactone (ALDACTONE) 25 MG tablet Take 1 tablet (25 mg total) by mouth daily.  30 tablet  3  . torsemide (DEMADEX) 20 MG tablet Take 20 mg by mouth 2 (two) times daily.       No current facility-administered medications for this encounter.    Filed Vitals:   09/04/14 1000  BP: 96/68  Pulse: 70  Weight: 244 lb 6.4 oz (110.859 kg)  SpO2: 96%   PHYSICAL EXAM: General:  Obese male;  Ambulated into clinic with a walker,  HEENT: normal Neck: supple. JVP difficult to assess d/t body habitus but appears mildly elevated.  Carotids 2+ bilaterally; no bruits. No lymphadenopathy or thryomegaly appreciated. Cor: PMI normal. Regular rate & rhythm. No rubs, gallops or murmurs. Lungs: clear Abdomen: obese soft, nontender, mildly distended. No hepatosplenomegaly. No bruits or masses. Good bowel sounds. Extremities: no cyanosis, clubbing, rash, tr bilateral  edema.  Neuro: alert & orientedx3, cranial nerves grossly intact. Affect pleasant.  ASSESSMENT & PLAN:  1) Chronic Systolic Heart Failure: NICM, EF 5-10% (06/2014) - NYHA III symptoms (partly related to hip pain) and volume status much improved. Recent RHC was ok. - Will continue current regimen.  - Continue coreg 12.5 mg BID  - Continue lisinopril 2.5 mg BID, spiro 25 mg daily and hydralazine 50 mg TID and Imdur 60 mg daily.  Will not titrate due to low BP.  - He is not interested in an ICD which could help with looking at thoracic impedence and volume as well as protect him from SCD.  - Reinforced the need and importance of daily weights, a low sodium diet and to avoid soups, and fluid restriction (less than 2 L a day). Instructed to call the HF clinic if weight increases more than 3 lbs overnight or 5 lbs in a week.  2) Gout - Continue allopurinol. Continue to follow with rheumatology. Is now going to Pain management clinic for pain.  3) Atrial fibrillation-  Appears to be in NSR today. Continue BB and Eliquis.  No s/s of bleeding.  4) Acute on Chronic renal failure, stage II-III - At baseline 1.6. Follows with Dr. Briant Cedar.   5) Pre-operative CV risk stratification - Based on his severe cardiomyopathy and renal dysfunction I fell that he is at high risk for peri-operative complications and even death but based on his recent RHC his HF appears to be relatively optimized and I do not think his risk is prohibitive, We have had multiple discussions about my concerns and he understands these clearly. However, he has severe life-limiting hip pain and wants to proceed with surgery. Given this situation, I think he can proceed to surgery with Dr. Charlann Boxer and we will follow along to help minimize post-operative cardiac issues. Would stop apixaban 48 hours prior to surgery. Restart as soon as possible post-op   Truman Hayward 10:36 AM

## 2014-09-04 NOTE — Telephone Encounter (Signed)
Dr Gala Romney called Dr Charlann Boxer yesterday to discuss, he feels pt is high risk for cardiac complications, will discuss w/pt at OV 10/14

## 2014-09-04 NOTE — Addendum Note (Signed)
Encounter addended by: Dolores Patty, MD on: 09/04/2014 10:48 AM<BR>     Documentation filed: Notes Section

## 2014-09-04 NOTE — Addendum Note (Signed)
Encounter addended by: Theresia Bough, CMA on: 09/04/2014 10:52 AM<BR>     Documentation filed: Patient Instructions Section

## 2014-09-10 ENCOUNTER — Inpatient Hospital Stay (HOSPITAL_COMMUNITY): Admission: RE | Admit: 2014-09-10 | Payer: Medicare HMO | Source: Ambulatory Visit | Admitting: Orthopedic Surgery

## 2014-09-10 ENCOUNTER — Encounter (HOSPITAL_COMMUNITY): Admission: RE | Payer: Self-pay | Source: Ambulatory Visit

## 2014-09-10 SURGERY — ARTHROPLASTY, HIP, TOTAL, ANTERIOR APPROACH
Anesthesia: Spinal | Site: Hip | Laterality: Left

## 2014-09-11 ENCOUNTER — Ambulatory Visit (HOSPITAL_COMMUNITY)
Admission: RE | Admit: 2014-09-11 | Discharge: 2014-09-11 | Disposition: A | Discharge status: Home / Self Care | Payer: Medicare HMO | Source: Ambulatory Visit | Attending: Anesthesiology | Admitting: Anesthesiology

## 2014-09-11 ENCOUNTER — Encounter (HOSPITAL_COMMUNITY)
Admission: RE | Admit: 2014-09-11 | Discharge: 2014-09-11 | Disposition: A | Discharge status: Home / Self Care | Payer: Medicare HMO | Source: Ambulatory Visit | Attending: Orthopedic Surgery | Admitting: Orthopedic Surgery

## 2014-09-11 ENCOUNTER — Encounter (HOSPITAL_COMMUNITY): Payer: Self-pay

## 2014-09-11 HISTORY — DX: Gastro-esophageal reflux disease without esophagitis: K21.9

## 2014-09-11 LAB — COMPREHENSIVE METABOLIC PANEL
ALT: 23 U/L (ref 0–53)
AST: 30 U/L (ref 0–37)
Albumin: 4.2 g/dL (ref 3.5–5.2)
Alkaline Phosphatase: 117 U/L (ref 39–117)
Anion gap: 15 (ref 5–15)
BUN: 36 mg/dL — ABNORMAL HIGH (ref 6–23)
CALCIUM: 10.5 mg/dL (ref 8.4–10.5)
CO2: 30 meq/L (ref 19–32)
Chloride: 94 mEq/L — ABNORMAL LOW (ref 96–112)
Creatinine, Ser: 1.91 mg/dL — ABNORMAL HIGH (ref 0.50–1.35)
GFR calc Af Amer: 41 mL/min — ABNORMAL LOW (ref >90)
GFR, EST NON AFRICAN AMERICAN: 35 mL/min — AB (ref >90)
GLUCOSE: 72 mg/dL (ref 70–99)
POTASSIUM: 4.7 meq/L (ref 3.7–5.3)
SODIUM: 139 meq/L (ref 137–147)
TOTAL PROTEIN: 8.4 g/dL — AB (ref 6.0–8.3)
Total Bilirubin: 0.2 mg/dL — ABNORMAL LOW (ref 0.3–1.2)

## 2014-09-11 LAB — SURGICAL PCR SCREEN
MRSA, PCR: NEGATIVE
STAPHYLOCOCCUS AUREUS: POSITIVE — AB

## 2014-09-11 LAB — APTT: aPTT: 45 seconds — ABNORMAL HIGH (ref 24–37)

## 2014-09-11 LAB — PROTIME-INR
INR: 1.01 (ref 0.00–1.49)
Prothrombin Time: 13.4 seconds (ref 11.6–15.2)

## 2014-09-11 LAB — CBC
HCT: 39.2 % (ref 39.0–52.0)
HEMOGLOBIN: 12.9 g/dL — AB (ref 13.0–17.0)
MCH: 28.4 pg (ref 26.0–34.0)
MCHC: 32.9 g/dL (ref 30.0–36.0)
MCV: 86.3 fL (ref 78.0–100.0)
Platelets: 248 10*3/uL (ref 150–400)
RBC: 4.54 MIL/uL (ref 4.22–5.81)
RDW: 13.8 % (ref 11.5–15.5)
WBC: 6.2 10*3/uL (ref 4.0–10.5)

## 2014-09-11 LAB — ABO/RH: ABO/RH(D): O NEG

## 2014-09-11 NOTE — Progress Notes (Signed)
Do not have complete orders on patient,Dr. Olin's office  Notified.

## 2014-09-11 NOTE — H&P (Signed)
TOTAL HIP ADMISSION H&P  Patient is admitted for left total hip arthroplasty, anterior approach.  Subjective:  Chief Complaint:     Left hip OA / pain   HPI: Frank Davidson, 63 y.o. male, has a history of pain and functional disability in the left hip(s) due to arthritis.  He is known radiographically and clinically to have advanced left hip osteoarthritis. He was scheduled previously but do to unfortunate medical course requiring two months hospitalization and four months of rehab following complications related to medication use, his case was cancelled.  He has been worked up extensively by cardiology and though everybody feels he is extremely high risks, he is optimized.   He severe high risks have been reviewed with the patient many times, but do to the incapacitating hip pain he is experiencing he would like to proceed with surgery. He is noted to have left hip pain and a work up having had advanced left hip osteoarthritis. In the office on exam he is walking with the use of a walker in the office but he uses a cane predominantly. He has a very limited range of motion of his left hip with pain with hip flexion and internal rotation. The right hip otherwise moves fluidly. He does appear to be deconditioned. Dr. Charlann Boxer reviewed with Frank Davidson his current situation with regards to his left hip. He has multiple medical issues going on, the only way his hip will improve, if his quality of life is significantly affected by this, is to perform a left total hip arthroplasty. He wishes to consider these options. I would not want him to go through this unless he understood the medical co-morbidities and risks associated with this. This would include followup with all of his coordinating physicians but would predominantly focus with Cardiology on his heart failure issues as well as his diabetes which he states is currently well managed despite the attempts at trying to initiate new treatment with Actos. Dr.  Charlann Boxer once again talked to Frank Davidson once we had the report from his Cardiologist saying he was a high risk candidate for any surgery. He states that he understands the high risk classification and associated risks and still wishes to proceed with surgery. Questions were encouraged, answered and reviewed. Risks, benefits and expectations were discussed with the patient. Risks including but not limited to the risk of anesthesia, blood clots, nerve damage, blood vessel damage, failure of the prosthesis, infection and up to and including death. Patient understand the risks, benefits and expectations and wishes to proceed with surgery.   PCP: Abbe Amsterdam, MD   D/C Plans: Home with HHPT/SNF   Post-op Meds: No Rx given   FYI:  Apixaban post-op   Look at current analgesic medications    Patient Active Problem List   Diagnosis Date Noted  . Acute on chronic renal failure 08/08/2014  . Preop cardiovascular exam 08/08/2014  . Gout 04/29/2014  . Decreased pedal pulses 04/19/2014  . Acute esophagitis 12/24/2013  . Dysphagia, unspecified(787.20) 10/01/2013  . Diarrhea 10/01/2013  . Acute on chronic systolic heart failure   . Acute CHF 09/04/2013  . Chest pain 09/04/2013  . Ocular herpes   . Neuropathy   . Left-sided weakness   . Acute on chronic diastolic CHF (congestive heart failure), NYHA class 3 07/09/2013  . Acute ischemic stroke 07/09/2013  . OSA (obstructive sleep apnea) 07/09/2013  . Gout attack 07/07/2013  . Chronic systolic heart failure 07/05/2013  . Stroke 07/03/2013  . CVA (  cerebral infarction) 07/03/2013  . A-fib 07/03/2013  . Hypokalemia 07/03/2013  . Hypertension   . COPD (chronic obstructive pulmonary disease)   . Diabetes mellitus without complication   . CKD (chronic kidney disease), stage III   . CHF (congestive heart failure)   . Nonischemic cardiomyopathy   . Polysubstance abuse    Past Medical History  Diagnosis Date  . Atrial fibrillation   .  Hypertension   . COPD (chronic obstructive pulmonary disease)   . Diabetes mellitus without complication   . Coronary artery disease   . Neuropathy   . Ocular herpes   . Gout   . CHF (congestive heart failure)     EF 15%  . Chronic pain   . Hyperlipidemia   . Nonischemic cardiomyopathy   . Ocular herpes   . Neuropathy   . Left-sided weakness   . Acute on chronic systolic heart failure   . Stroke     x 5. last stroke aug 2014  . Decreased pedal pulses   . Memory impairment     "short term"  . CKD (chronic kidney disease), stage III     Dr. Briant CedarMattingly follows-holding   . Fibromyalgia     neuropathy- hands, more than feet.  . Osteoarthritis   . Sleep apnea with use of continuous positive airway pressure (CPAP)     does not use cpap  . GERD (gastroesophageal reflux disease)     tums as needed    Past Surgical History  Procedure Laterality Date  . 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 Cardiac catheterization  10/2006    normal coronary arteries  . Tonsillectomy  age 63  . Esophagogastroduodenoscopy N/A 12/24/2013    Procedure: ESOPHAGOGASTRODUODENOSCOPY (EGD);  Surgeon: Louis Meckelobert D Kaplan, MD;  [facility];  Service: Endoscopy;  Laterality: N/A;  . Colonoscopy N/A 12/24/2013    Procedure: COLONOSCOPY;  Surgeon: Louis Meckelobert D Kaplan, MD;  Location: [facility];  Service: Endoscopy;  Laterality: N/A;    No prescriptions prior to admission   Allergies  Allergen Reactions  . Actos [Pioglitazone] Other (See Comments)    Caused blood in urine and fluid retention   . Januvia [Sitagliptin] Other (See Comments)    "Almost killed me"     History  Substance Use Topics  . Smoking status: Former Smoker -- 0.50 packs/day for 10 years    Types: Cigarettes    Quit date: 12/18/2006  . Smokeless tobacco: Never Used  . Alcohol Use: No     Comment: Quit 2008    Family History  Problem Relation Age of Onset  . Cancer Mother   . Hypertension Mother   . Lung disease Mother   .  Diabetes Father   . Diabetes Sister   . Hypertension Sister   . Hypertension Brother   . Hypertension Sister   . Colon cancer Neg Hx      Review of Systems  Constitutional: Positive for malaise/fatigue.  HENT: Negative.   Eyes: Negative.   Respiratory: Positive for shortness of breath (on exertion).   Cardiovascular: Negative.   Gastrointestinal: Negative.   Genitourinary: Negative.   Musculoskeletal: Positive for joint pain and myalgias.  Skin: Negative.   Neurological: Negative.   Endo/Heme/Allergies: Negative.   Psychiatric/Behavioral: Positive for memory loss.    Objective:  Physical Exam  Constitutional: He is oriented to person, place, and time. He appears well-developed.  HENT:  Head: Normocephalic.  Eyes: Pupils are equal, round, and reactive to  light.  Neck: Neck supple.  Cardiovascular: Normal rate and regular rhythm.  Exam reveals decreased pulses.   Respiratory: Effort normal.  GI: Soft. There is no tenderness. There is no guarding.  Musculoskeletal:       Left hip: He exhibits decreased range of motion, decreased strength, tenderness and bony tenderness. He exhibits no deformity and no laceration.  Neurological: He is alert and oriented to person, place, and time.  Skin: Skin is warm and dry.  Psychiatric: He has a normal mood and affect.    Vital signs in last 24 hours: Temp:  [98.5 F (36.9 C)] 98.5 F (36.9 C) (10/21 1236) Pulse Rate:  [84] 84 (10/21 1236) Resp:  [18] 18 (10/21 1236) BP: (117)/(68) 117/68 mmHg (10/21 1236) SpO2:  [99 %] 99 % (10/21 1236)  Labs:   Estimated body mass index is 32.93 kg/(m^2) as calculated from the following:   Height as of 08/22/14: 5\' 11"  (1.803 m).   Weight as of 08/22/14: 107.049 kg (236 lb).   Imaging Review Plain radiographs demonstrate severe degenerative joint disease of the left hip(s). The bone quality appears to be good for age and reported activity level.  Assessment/Plan:  End stage arthritis,  left hip(s)  The patient history, physical examination, clinical judgement of the provider and imaging studies are consistent with end stage degenerative joint disease of the left hip(s) and total hip arthroplasty is deemed medically necessary. The treatment options including medical management, injection therapy, arthroscopy and arthroplasty were discussed at length. The risks and benefits of total hip arthroplasty were presented and reviewed. The risks due to aseptic loosening, infection, stiffness, dislocation/subluxation,  thromboembolic complications and other imponderables were discussed.  The patient acknowledged the explanation, agreed to proceed with the plan and consent was signed. Patient is being admitted for inpatient treatment for surgery, pain control, PT, OT, prophylactic antibiotics, VTE prophylaxis, progressive ambulation and ADL's and discharge planning.The patient is planning to be discharged to skilled nursing facility  / home.    Anastasio Auerbach Jacai Kipp   PA-C  09/11/2014, 9:37 PM

## 2014-09-11 NOTE — Progress Notes (Signed)
Anesthesia Chart Review:  Pt is 63 year old man scheduled for L total hip arthoplasty  On 09/12/2014 with Dr. Charlann Boxer.   PMH: NICM, Systolic heart failure (NYHA III), EF 15%, (06/2014). Atrial fibrillation. Chronic renal failure, stage II-III. Diabetes, stroke, COPD, HTN, CAD, OSA, neuropathy, ocular herpes, L sided weakness, memory impairment.  Medications include: Eliquis, carvedilol, glipizide, hydralazine, isosorbide mononitrate, lisinopril, metolazone,spironolactone, torsemide, diclofenac patch, allopurinol, colchicine, cyclobenzaprine, pregabalin   Preoperative labs reviewed.   PT/PTT: 13.4/45  BUN/Cr: 36/1.91 (previous results over last 4 months range BUN 22-62/Cr 1.46-2.68.   Chest X-ray reviewed. Stable linear scarring. Stable cardiomegaly. No active lung disease.  EKG 08/22/2014: Sinus rhythm, prolonged PR interval, low voltage extremity leads, abnormal R wave progression, borderline prolonged QT interval   Echo 07/05/14:  -Left ventricle: LV is difficult to see due to poor acoustic windows. Overall function appears severely depressed. Would recomm limited study with contrast to evaluate function. The cavity size was severely dilated. Wall thickness was increased in a pattern of mild LVH. - Left atrium: The atrium was moderately dilated. - Right ventricle: RV is not seen well enough to evaluate function  Dr. Gala Romney interpreted EF at 15% from this echo per note 09/04/2014.   R heart cath 08/13/2014:  1. Mild pulmonary HTN  2. Well compensated left sided filling pressures  3. Normal cardiac output  Dr. Gala Romney saw pt on 09/04/2014 and clears for surgery at high risk for peri-operative complications. "Based on his severe cardiomyopathy and renal dysfunction I fell that he is at high risk for peri-operative complications and even death but based on his recent RHC his HF appears to be relatively optimized and I do not think his risk is prohibitive, We have had multiple discussions  about my concerns and he understands these clearly. However, he has severe life-limiting hip pain and wants to proceed with surgery. Given this situation, I think he can proceed to surgery with Dr. Charlann Boxer and we will follow along to help minimize post-operative cardiac issues. Would stop apixaban 48 hours prior to surgery. Restart as soon as possible post-op"  Pt stopped apixaban yesterday so will be off of it for 48 hours prior to surgery.   Rica Mast, FNP-BC [facility]/Anesthesiology Phone: [phone] 09/11/2014 4:14 PM

## 2014-09-11 NOTE — Pre-Procedure Instructions (Signed)
Frank Davidson  09/11/2014  Your procedure is scheduled on:  09-12-2014    Thursday   Report to [facility] Admitting at 9:00 AM.   Call this number if you have problems the morning of surgery: [phone]   Remember:   Do not eat food or drink liquids after midnight.    Take these medicines the morning of surgery with A SIP OF WATER: allopurinol,carvedilol(Coreg),hydralazine(apresoline),isosorbide(Imdur),protonix,potassium,lyricia if needed   Do not wear jewelry,.  Do not wear lotions, powders, or perfumes. You may not wear deodorant.  Do not shave 48 hours prior to surgery. Men may shave face and neck.  Do not bring valuables to the hospital.  [facility] is not responsibl for any belongings or valuables.               Contacts, dentures or bridgework may not be worn into surgery .  Leave suitcase in the car. After surgery it may be brought to your room.   For patients admitted to the hospital, discharge time is determined by your  treatment team.                  Special Instructions: See attached sheet for instructions on CHG shower/bath     Please read over the following fact sheets that you were given: Pain Booklet, Coughing and Deep Breathing, Blood Transfusion Information and Surgical Site Infection Prevention

## 2014-09-12 ENCOUNTER — Inpatient Hospital Stay (HOSPITAL_COMMUNITY)
Admission: RE | Admit: 2014-09-12 | Discharge: 2014-09-16 | DRG: 470 | Disposition: A | Discharge status: Home Health | Payer: Medicare HMO | Source: Ambulatory Visit | Attending: Orthopedic Surgery | Admitting: Orthopedic Surgery

## 2014-09-12 ENCOUNTER — Encounter (HOSPITAL_COMMUNITY)
Admission: RE | Disposition: A | Discharge status: Home Health | Payer: Self-pay | Source: Ambulatory Visit | Attending: Orthopedic Surgery

## 2014-09-12 ENCOUNTER — Encounter (HOSPITAL_COMMUNITY): Payer: Self-pay | Admitting: *Deleted

## 2014-09-12 ENCOUNTER — Inpatient Hospital Stay (HOSPITAL_COMMUNITY): Payer: Medicare HMO

## 2014-09-12 ENCOUNTER — Encounter (HOSPITAL_COMMUNITY): Payer: Medicare HMO | Admitting: Emergency Medicine

## 2014-09-12 ENCOUNTER — Inpatient Hospital Stay (HOSPITAL_COMMUNITY): Payer: Medicare HMO | Admitting: Anesthesiology

## 2014-09-12 DIAGNOSIS — I4891 Unspecified atrial fibrillation: Secondary | ICD-10-CM | POA: Diagnosis present

## 2014-09-12 DIAGNOSIS — Z87891 Personal history of nicotine dependence: Secondary | ICD-10-CM

## 2014-09-12 DIAGNOSIS — M25552 Pain in left hip: Secondary | ICD-10-CM | POA: Diagnosis present

## 2014-09-12 DIAGNOSIS — I959 Hypotension, unspecified: Secondary | ICD-10-CM | POA: Diagnosis present

## 2014-09-12 DIAGNOSIS — I5022 Chronic systolic (congestive) heart failure: Secondary | ICD-10-CM | POA: Diagnosis present

## 2014-09-12 DIAGNOSIS — Z8249 Family history of ischemic heart disease and other diseases of the circulatory system: Secondary | ICD-10-CM

## 2014-09-12 DIAGNOSIS — G629 Polyneuropathy, unspecified: Secondary | ICD-10-CM | POA: Diagnosis present

## 2014-09-12 DIAGNOSIS — N183 Chronic kidney disease, stage 3 (moderate): Secondary | ICD-10-CM | POA: Diagnosis present

## 2014-09-12 DIAGNOSIS — Z833 Family history of diabetes mellitus: Secondary | ICD-10-CM | POA: Diagnosis not present

## 2014-09-12 DIAGNOSIS — I2510 Atherosclerotic heart disease of native coronary artery without angina pectoris: Secondary | ICD-10-CM | POA: Diagnosis present

## 2014-09-12 DIAGNOSIS — G8918 Other acute postprocedural pain: Secondary | ICD-10-CM | POA: Diagnosis not present

## 2014-09-12 DIAGNOSIS — J449 Chronic obstructive pulmonary disease, unspecified: Secondary | ICD-10-CM | POA: Diagnosis present

## 2014-09-12 DIAGNOSIS — I272 Other secondary pulmonary hypertension: Secondary | ICD-10-CM | POA: Diagnosis present

## 2014-09-12 DIAGNOSIS — I428 Other cardiomyopathies: Secondary | ICD-10-CM | POA: Diagnosis present

## 2014-09-12 DIAGNOSIS — D62 Acute posthemorrhagic anemia: Secondary | ICD-10-CM | POA: Diagnosis not present

## 2014-09-12 DIAGNOSIS — Z8673 Personal history of transient ischemic attack (TIA), and cerebral infarction without residual deficits: Secondary | ICD-10-CM | POA: Diagnosis not present

## 2014-09-12 DIAGNOSIS — Z01811 Encounter for preprocedural respiratory examination: Secondary | ICD-10-CM | POA: Diagnosis not present

## 2014-09-12 DIAGNOSIS — K219 Gastro-esophageal reflux disease without esophagitis: Secondary | ICD-10-CM | POA: Diagnosis present

## 2014-09-12 DIAGNOSIS — M1612 Unilateral primary osteoarthritis, left hip: Secondary | ICD-10-CM | POA: Diagnosis present

## 2014-09-12 DIAGNOSIS — I129 Hypertensive chronic kidney disease with stage 1 through stage 4 chronic kidney disease, or unspecified chronic kidney disease: Secondary | ICD-10-CM | POA: Diagnosis present

## 2014-09-12 DIAGNOSIS — Z96649 Presence of unspecified artificial hip joint: Secondary | ICD-10-CM

## 2014-09-12 DIAGNOSIS — G4733 Obstructive sleep apnea (adult) (pediatric): Secondary | ICD-10-CM | POA: Diagnosis present

## 2014-09-12 DIAGNOSIS — E119 Type 2 diabetes mellitus without complications: Secondary | ICD-10-CM | POA: Diagnosis present

## 2014-09-12 DIAGNOSIS — R531 Weakness: Secondary | ICD-10-CM | POA: Diagnosis present

## 2014-09-12 HISTORY — DX: Type 2 diabetes mellitus without complications: E11.9

## 2014-09-12 HISTORY — DX: Unspecified osteoarthritis, unspecified site: M19.90

## 2014-09-12 HISTORY — PX: TOTAL HIP ARTHROPLASTY: SHX124

## 2014-09-12 HISTORY — DX: Unspecified asthma, uncomplicated: J45.909

## 2014-09-12 LAB — GLUCOSE, CAPILLARY
GLUCOSE-CAPILLARY: 111 mg/dL — AB (ref 70–99)
GLUCOSE-CAPILLARY: 152 mg/dL — AB (ref 70–99)
Glucose-Capillary: 155 mg/dL — ABNORMAL HIGH (ref 70–99)
Glucose-Capillary: 116 mg/dL — ABNORMAL HIGH (ref 70–99)

## 2014-09-12 SURGERY — ARTHROPLASTY, HIP, TOTAL, ANTERIOR APPROACH
Anesthesia: Choice | Site: Hip | Laterality: Left

## 2014-09-12 MED ORDER — MUPIROCIN 2 % EX OINT
TOPICAL_OINTMENT | CUTANEOUS | Status: AC
  Administered 2014-09-12: 1
  Filled 2014-09-12: qty 22

## 2014-09-12 MED ORDER — BISMUTH SUBSALICYLATE 262 MG/15ML PO SUSP
15.0000 mL | Freq: Four times a day (QID) | ORAL | Status: DC | PRN
  Filled 2014-09-12: qty 236

## 2014-09-12 MED ORDER — BISACODYL 10 MG RE SUPP: 10.0000 mg | Freq: Every day | RECTAL | Status: DC | PRN

## 2014-09-12 MED ORDER — FERROUS SULFATE 325 (65 FE) MG PO TABS
325.0000 mg | ORAL_TABLET | Freq: Three times a day (TID) | ORAL | Status: DC
  Administered 2014-09-12 – 2014-09-16 (×12): 325 mg via ORAL
  Filled 2014-09-12 (×14): qty 1

## 2014-09-12 MED ORDER — ONDANSETRON HCL 4 MG/2ML IJ SOLN: 4.0000 mg | Freq: Once | INTRAMUSCULAR | Status: DC | PRN

## 2014-09-12 MED ORDER — MAGNESIUM CITRATE PO SOLN: 1.0000 | Freq: Once | ORAL | Status: AC | PRN

## 2014-09-12 MED ORDER — PREGABALIN 75 MG PO CAPS
150.0000 mg | ORAL_CAPSULE | Freq: Every day | ORAL | Status: DC
  Administered 2014-09-13: 75 mg via ORAL
  Administered 2014-09-14 – 2014-09-15 (×2): 150 mg via ORAL
  Administered 2014-09-16: 75 mg via ORAL
  Filled 2014-09-12 (×4): qty 2

## 2014-09-12 MED ORDER — INSULIN ASPART 100 UNIT/ML ~~LOC~~ SOLN
0.0000 [IU] | Freq: Three times a day (TID) | SUBCUTANEOUS | Status: DC
  Administered 2014-09-12: 3 [IU] via SUBCUTANEOUS

## 2014-09-12 MED ORDER — POTASSIUM CHLORIDE CRYS ER 20 MEQ PO TBCR
20.0000 meq | EXTENDED_RELEASE_TABLET | Freq: Every day | ORAL | Status: DC
  Administered 2014-09-13 – 2014-09-16 (×4): 20 meq via ORAL
  Filled 2014-09-12 (×4): qty 1

## 2014-09-12 MED ORDER — NEOSTIGMINE METHYLSULFATE 10 MG/10ML IV SOLN
INTRAVENOUS | Status: DC | PRN
  Administered 2014-09-12: 3 mg via INTRAVENOUS

## 2014-09-12 MED ORDER — SPIRONOLACTONE 25 MG PO TABS
25.0000 mg | ORAL_TABLET | Freq: Every day | ORAL | Status: DC
  Administered 2014-09-13 – 2014-09-16 (×4): 25 mg via ORAL
  Filled 2014-09-12 (×4): qty 1

## 2014-09-12 MED ORDER — PANTOPRAZOLE SODIUM 40 MG PO TBEC
40.0000 mg | DELAYED_RELEASE_TABLET | Freq: Two times a day (BID) | ORAL | Status: DC
  Administered 2014-09-13 – 2014-09-16 (×7): 40 mg via ORAL
  Filled 2014-09-12 (×6): qty 1

## 2014-09-12 MED ORDER — ONDANSETRON HCL 4 MG/2ML IJ SOLN
INTRAMUSCULAR | Status: DC | PRN
  Administered 2014-09-12: 4 mg via INTRAVENOUS

## 2014-09-12 MED ORDER — EPHEDRINE SULFATE 50 MG/ML IJ SOLN
INTRAMUSCULAR | Status: DC | PRN
  Administered 2014-09-12: 10 mg via INTRAVENOUS

## 2014-09-12 MED ORDER — SODIUM CHLORIDE 0.9 % IR SOLN
Status: DC | PRN
  Administered 2014-09-12: 1000 mL

## 2014-09-12 MED ORDER — PHENOL 1.4 % MT LIQD: 1.0000 | OROMUCOSAL | Status: DC | PRN

## 2014-09-12 MED ORDER — FENTANYL CITRATE 0.05 MG/ML IJ SOLN
INTRAMUSCULAR | Status: AC
  Filled 2014-09-12: qty 5

## 2014-09-12 MED ORDER — HYDROMORPHONE HCL 1 MG/ML IJ SOLN
0.2500 mg | INTRAMUSCULAR | Status: DC | PRN
  Administered 2014-09-12 (×4): 0.5 mg via INTRAVENOUS

## 2014-09-12 MED ORDER — SODIUM CHLORIDE 0.9 % IV SOLN: INTRAVENOUS | Status: DC

## 2014-09-12 MED ORDER — SUCCINYLCHOLINE CHLORIDE 20 MG/ML IJ SOLN
INTRAMUSCULAR | Status: DC | PRN
  Administered 2014-09-12: 120 mg via INTRAVENOUS

## 2014-09-12 MED ORDER — TORSEMIDE 20 MG PO TABS
20.0000 mg | ORAL_TABLET | Freq: Two times a day (BID) | ORAL | Status: DC
  Administered 2014-09-12 – 2014-09-16 (×8): 20 mg via ORAL
  Filled 2014-09-12 (×10): qty 1

## 2014-09-12 MED ORDER — POLYETHYLENE GLYCOL 3350 17 G PO PACK
17.0000 g | PACK | Freq: Two times a day (BID) | ORAL | Status: DC
  Administered 2014-09-13 – 2014-09-16 (×7): 17 g via ORAL
  Filled 2014-09-12 (×9): qty 1

## 2014-09-12 MED ORDER — ALBUMIN HUMAN 5 % IV SOLN
INTRAVENOUS | Status: DC | PRN
  Administered 2014-09-12: 12:00:00 via INTRAVENOUS

## 2014-09-12 MED ORDER — NEOSTIGMINE METHYLSULFATE 10 MG/10ML IV SOLN
INTRAVENOUS | Status: AC
  Filled 2014-09-12: qty 1

## 2014-09-12 MED ORDER — ONDANSETRON HCL 4 MG PO TABS: 4.0000 mg | ORAL_TABLET | Freq: Four times a day (QID) | ORAL | Status: DC | PRN

## 2014-09-12 MED ORDER — LACTATED RINGERS IV SOLN
INTRAVENOUS | Status: DC | PRN
  Administered 2014-09-12 (×2): via INTRAVENOUS

## 2014-09-12 MED ORDER — MENTHOL 3 MG MT LOZG
1.0000 | LOZENGE | OROMUCOSAL | Status: DC | PRN
  Filled 2014-09-12: qty 9

## 2014-09-12 MED ORDER — MIDAZOLAM HCL 5 MG/5ML IJ SOLN
INTRAMUSCULAR | Status: DC | PRN
  Administered 2014-09-12: 2 mg via INTRAVENOUS

## 2014-09-12 MED ORDER — HYDROMORPHONE HCL 1 MG/ML IJ SOLN
0.5000 mg | INTRAMUSCULAR | Status: DC | PRN
  Administered 2014-09-12 – 2014-09-14 (×4): 1 mg via INTRAVENOUS
  Filled 2014-09-12: qty 1
  Filled 2014-09-12: qty 2
  Filled 2014-09-12: qty 1
  Filled 2014-09-12: qty 2

## 2014-09-12 MED ORDER — ALLOPURINOL 100 MG PO TABS
100.0000 mg | ORAL_TABLET | Freq: Every day | ORAL | Status: DC
  Administered 2014-09-13 – 2014-09-16 (×4): 100 mg via ORAL
  Filled 2014-09-12 (×6): qty 1

## 2014-09-12 MED ORDER — METOLAZONE 5 MG PO TABS
5.0000 mg | ORAL_TABLET | Freq: Every day | ORAL | Status: DC | PRN
  Filled 2014-09-12: qty 1

## 2014-09-12 MED ORDER — FENTANYL CITRATE 0.05 MG/ML IJ SOLN
INTRAMUSCULAR | Status: DC | PRN
  Administered 2014-09-12: 50 ug via INTRAVENOUS
  Administered 2014-09-12 (×2): 100 ug via INTRAVENOUS
  Administered 2014-09-12 (×2): 50 ug via INTRAVENOUS
  Administered 2014-09-12: 150 ug via INTRAVENOUS

## 2014-09-12 MED ORDER — HYDROMORPHONE HCL 1 MG/ML IJ SOLN
INTRAMUSCULAR | Status: AC
  Filled 2014-09-12: qty 1

## 2014-09-12 MED ORDER — ONDANSETRON HCL 4 MG/2ML IJ SOLN
INTRAMUSCULAR | Status: AC
  Filled 2014-09-12: qty 2

## 2014-09-12 MED ORDER — METHOCARBAMOL 500 MG PO TABS: 500.0000 mg | ORAL_TABLET | Freq: Four times a day (QID) | ORAL | Status: DC | PRN

## 2014-09-12 MED ORDER — CEFAZOLIN SODIUM-DEXTROSE 2-3 GM-% IV SOLR
2.0000 g | Freq: Once | INTRAVENOUS | Status: DC
  Filled 2014-09-12: qty 50

## 2014-09-12 MED ORDER — ROCURONIUM BROMIDE 50 MG/5ML IV SOLN
INTRAVENOUS | Status: AC
  Filled 2014-09-12: qty 2

## 2014-09-12 MED ORDER — PREGABALIN 75 MG PO CAPS: 300.0000 mg | ORAL_CAPSULE | Freq: Every evening | ORAL | Status: DC | PRN

## 2014-09-12 MED ORDER — COLCHICINE 0.6 MG PO TABS
1.2000 mg | ORAL_TABLET | Freq: Every day | ORAL | Status: DC | PRN
  Filled 2014-09-12: qty 2

## 2014-09-12 MED ORDER — SUCCINYLCHOLINE CHLORIDE 20 MG/ML IJ SOLN
INTRAMUSCULAR | Status: AC
  Filled 2014-09-12: qty 1

## 2014-09-12 MED ORDER — METHOCARBAMOL 1000 MG/10ML IJ SOLN
500.0000 mg | INTRAVENOUS | Status: AC
  Administered 2014-09-12: 500 mg via INTRAVENOUS
  Filled 2014-09-12: qty 5

## 2014-09-12 MED ORDER — CARVEDILOL 12.5 MG PO TABS
12.5000 mg | ORAL_TABLET | Freq: Two times a day (BID) | ORAL | Status: DC
  Administered 2014-09-12 – 2014-09-16 (×6): 12.5 mg via ORAL
  Filled 2014-09-12 (×11): qty 1

## 2014-09-12 MED ORDER — MIDAZOLAM HCL 2 MG/2ML IJ SOLN
INTRAMUSCULAR | Status: AC
  Filled 2014-09-12: qty 2

## 2014-09-12 MED ORDER — LISINOPRIL 2.5 MG PO TABS
2.5000 mg | ORAL_TABLET | Freq: Two times a day (BID) | ORAL | Status: DC
  Administered 2014-09-12 – 2014-09-15 (×4): 2.5 mg via ORAL
  Filled 2014-09-12 (×9): qty 1

## 2014-09-12 MED ORDER — PHENYLEPHRINE HCL 10 MG/ML IJ SOLN
INTRAMUSCULAR | Status: DC | PRN
  Administered 2014-09-12: 80 ug via INTRAVENOUS

## 2014-09-12 MED ORDER — GLYCOPYRROLATE 0.2 MG/ML IJ SOLN
INTRAMUSCULAR | Status: DC | PRN
  Administered 2014-09-12: .4 mg via INTRAVENOUS

## 2014-09-12 MED ORDER — CEFAZOLIN SODIUM-DEXTROSE 2-3 GM-% IV SOLR
INTRAVENOUS | Status: DC | PRN
  Administered 2014-09-12: 2 g via INTRAVENOUS

## 2014-09-12 MED ORDER — VANCOMYCIN HCL 1000 MG IV SOLR
1000.0000 mg | Freq: Two times a day (BID) | INTRAVENOUS | Status: DC
  Administered 2014-09-12 (×2): 1000 mg via INTRAVENOUS
  Filled 2014-09-12 (×4): qty 1000

## 2014-09-12 MED ORDER — PHENYLEPHRINE 40 MCG/ML (10ML) SYRINGE FOR IV PUSH (FOR BLOOD PRESSURE SUPPORT)
PREFILLED_SYRINGE | INTRAVENOUS | Status: AC
  Filled 2014-09-12: qty 10

## 2014-09-12 MED ORDER — DIPHENHYDRAMINE HCL 25 MG PO CAPS
25.0000 mg | ORAL_CAPSULE | Freq: Four times a day (QID) | ORAL | Status: DC | PRN
  Administered 2014-09-15 (×2): 25 mg via ORAL
  Filled 2014-09-12 (×2): qty 1

## 2014-09-12 MED ORDER — METHOCARBAMOL 1000 MG/10ML IJ SOLN
500.0000 mg | Freq: Four times a day (QID) | INTRAVENOUS | Status: DC | PRN
  Filled 2014-09-12: qty 5

## 2014-09-12 MED ORDER — GLIPIZIDE 5 MG PO TABS
5.0000 mg | ORAL_TABLET | Freq: Two times a day (BID) | ORAL | Status: DC
  Administered 2014-09-12 – 2014-09-16 (×8): 5 mg via ORAL
  Filled 2014-09-12 (×9): qty 1

## 2014-09-12 MED ORDER — ISOSORBIDE MONONITRATE ER 60 MG PO TB24
60.0000 mg | ORAL_TABLET | Freq: Every morning | ORAL | Status: DC
  Administered 2014-09-13 – 2014-09-16 (×3): 60 mg via ORAL
  Filled 2014-09-12 (×4): qty 1

## 2014-09-12 MED ORDER — CELECOXIB 200 MG PO CAPS
200.0000 mg | ORAL_CAPSULE | Freq: Two times a day (BID) | ORAL | Status: DC
  Administered 2014-09-12 – 2014-09-16 (×8): 200 mg via ORAL
  Filled 2014-09-12 (×9): qty 1

## 2014-09-12 MED ORDER — ARTIFICIAL TEARS OP OINT
TOPICAL_OINTMENT | OPHTHALMIC | Status: AC
  Filled 2014-09-12: qty 3.5

## 2014-09-12 MED ORDER — SODIUM CHLORIDE 0.9 % IV SOLN
INTRAVENOUS | Status: DC
  Administered 2014-09-15: 09:00:00 via INTRAVENOUS

## 2014-09-12 MED ORDER — CEFAZOLIN SODIUM-DEXTROSE 2-3 GM-% IV SOLR
2.0000 g | Freq: Four times a day (QID) | INTRAVENOUS | Status: AC
  Administered 2014-09-12 (×2): 2 g via INTRAVENOUS
  Filled 2014-09-12 (×2): qty 50

## 2014-09-12 MED ORDER — CHLORHEXIDINE GLUCONATE 4 % EX LIQD: 60.0000 mL | Freq: Once | CUTANEOUS | Status: DC

## 2014-09-12 MED ORDER — PROPOFOL 10 MG/ML IV BOLUS
INTRAVENOUS | Status: AC
  Filled 2014-09-12: qty 20

## 2014-09-12 MED ORDER — OXYCODONE HCL 5 MG PO TABS
5.0000 mg | ORAL_TABLET | ORAL | Status: DC
  Administered 2014-09-12 – 2014-09-16 (×24): 15 mg via ORAL
  Filled 2014-09-12 (×12): qty 3
  Filled 2014-09-12: qty 2
  Filled 2014-09-12 (×6): qty 3
  Filled 2014-09-12: qty 1
  Filled 2014-09-12 (×6): qty 3

## 2014-09-12 MED ORDER — ONDANSETRON HCL 4 MG/2ML IJ SOLN
4.0000 mg | Freq: Four times a day (QID) | INTRAMUSCULAR | Status: DC | PRN
  Filled 2014-09-12: qty 2

## 2014-09-12 MED ORDER — APIXABAN 5 MG PO TABS
5.0000 mg | ORAL_TABLET | Freq: Two times a day (BID) | ORAL | Status: DC
  Administered 2014-09-12 – 2014-09-16 (×8): 5 mg via ORAL
  Filled 2014-09-12 (×9): qty 1

## 2014-09-12 MED ORDER — METOCLOPRAMIDE HCL 5 MG/ML IJ SOLN: 5.0000 mg | Freq: Three times a day (TID) | INTRAMUSCULAR | Status: DC | PRN

## 2014-09-12 MED ORDER — VANCOMYCIN HCL IN DEXTROSE 1-5 GM/200ML-% IV SOLN
INTRAVENOUS | Status: AC
  Filled 2014-09-12: qty 200

## 2014-09-12 MED ORDER — GLYCOPYRROLATE 0.2 MG/ML IJ SOLN
INTRAMUSCULAR | Status: AC
  Filled 2014-09-12: qty 2

## 2014-09-12 MED ORDER — LIDOCAINE HCL 4 % MT SOLN
OROMUCOSAL | Status: DC | PRN
  Administered 2014-09-12: 4 mL via TOPICAL

## 2014-09-12 MED ORDER — LIDOCAINE HCL (CARDIAC) 20 MG/ML IV SOLN
INTRAVENOUS | Status: AC
  Filled 2014-09-12: qty 15

## 2014-09-12 MED ORDER — EPHEDRINE SULFATE 50 MG/ML IJ SOLN
INTRAMUSCULAR | Status: AC
  Filled 2014-09-12: qty 1

## 2014-09-12 MED ORDER — ROCURONIUM BROMIDE 100 MG/10ML IV SOLN
INTRAVENOUS | Status: DC | PRN
  Administered 2014-09-12: 50 mg via INTRAVENOUS

## 2014-09-12 MED ORDER — LIDOCAINE HCL (CARDIAC) 20 MG/ML IV SOLN
INTRAVENOUS | Status: DC | PRN
  Administered 2014-09-12: 100 mg via INTRAVENOUS

## 2014-09-12 MED ORDER — PREGABALIN 75 MG PO CAPS: 150.0000 mg | ORAL_CAPSULE | Freq: Two times a day (BID) | ORAL | Status: DC | PRN

## 2014-09-12 MED ORDER — METOCLOPRAMIDE HCL 10 MG PO TABS: 5.0000 mg | ORAL_TABLET | Freq: Three times a day (TID) | ORAL | Status: DC | PRN

## 2014-09-12 MED ORDER — SODIUM CHLORIDE 0.9 % IJ SOLN
INTRAMUSCULAR | Status: AC
  Filled 2014-09-12: qty 10

## 2014-09-12 MED ORDER — DICLOFENAC EPOLAMINE 1.3 % TD PTCH
1.0000 | MEDICATED_PATCH | Freq: Every day | TRANSDERMAL | Status: DC | PRN
  Filled 2014-09-12: qty 1

## 2014-09-12 MED ORDER — MUPIROCIN 2 % EX OINT: 1.0000 "application " | TOPICAL_OINTMENT | Freq: Once | CUTANEOUS | Status: DC

## 2014-09-12 MED ORDER — HYDRALAZINE HCL 50 MG PO TABS
50.0000 mg | ORAL_TABLET | Freq: Two times a day (BID) | ORAL | Status: DC
  Administered 2014-09-12 – 2014-09-15 (×3): 50 mg via ORAL
  Filled 2014-09-12 (×9): qty 1

## 2014-09-12 MED ORDER — PROPOFOL 10 MG/ML IV BOLUS
INTRAVENOUS | Status: DC | PRN
  Administered 2014-09-12: 200 mg via INTRAVENOUS

## 2014-09-12 MED ORDER — DOCUSATE SODIUM 100 MG PO CAPS
100.0000 mg | ORAL_CAPSULE | Freq: Two times a day (BID) | ORAL | Status: DC
  Administered 2014-09-13 – 2014-09-16 (×7): 100 mg via ORAL
  Filled 2014-09-12 (×8): qty 1

## 2014-09-12 MED ORDER — ALUM & MAG HYDROXIDE-SIMETH 200-200-20 MG/5ML PO SUSP: 30.0000 mL | ORAL | Status: DC | PRN

## 2014-09-12 SURGICAL SUPPLY — 56 items
BLADE SAW SGTL 18X1.27X75 (BLADE) ×2 IMPLANT
BLADE SAW SGTL 18X1.27X75MM (BLADE) ×1
BLADE SURG ROTATE 9660 (MISCELLANEOUS) IMPLANT
CANISTER SUCTION 2500CC (MISCELLANEOUS) ×3 IMPLANT
CAPT HIP PF MOP ×3 IMPLANT
CELLS DAT CNTRL 66122 CELL SVR (MISCELLANEOUS) IMPLANT
COVER BACK TABLE 24X17X13 BIG (DRAPES) IMPLANT
COVER SURGICAL LIGHT HANDLE (MISCELLANEOUS) ×3 IMPLANT
DERMABOND ADVANCED (GAUZE/BANDAGES/DRESSINGS) ×2
DERMABOND ADVANCED .7 DNX12 (GAUZE/BANDAGES/DRESSINGS) ×1 IMPLANT
DRAPE C-ARM 42X72 X-RAY (DRAPES) ×3 IMPLANT
DRAPE STERI IOBAN 125X83 (DRAPES) ×3 IMPLANT
DRAPE U-SHAPE 47X51 STRL (DRAPES) ×9 IMPLANT
DRSG AQUACEL AG ADV 3.5X10 (GAUZE/BANDAGES/DRESSINGS) ×3 IMPLANT
DRSG TEGADERM 4X4.75 (GAUZE/BANDAGES/DRESSINGS) ×3 IMPLANT
DURAPREP 26ML APPLICATOR (WOUND CARE) ×3 IMPLANT
ELECT BLADE 4.0 EZ CLEAN MEGAD (MISCELLANEOUS) ×3
ELECT BLADE TIP CTD 4 INCH (ELECTRODE) IMPLANT
ELECT REM PT RETURN 9FT ADLT (ELECTROSURGICAL) ×3
ELECTRODE BLDE 4.0 EZ CLN MEGD (MISCELLANEOUS) ×1 IMPLANT
ELECTRODE REM PT RTRN 9FT ADLT (ELECTROSURGICAL) ×1 IMPLANT
EVACUATOR 1/8 PVC DRAIN (DRAIN) ×3 IMPLANT
FACESHIELD WRAPAROUND (MASK) ×6 IMPLANT
GAUZE SPONGE 2X2 8PLY STRL LF (GAUZE/BANDAGES/DRESSINGS) ×1 IMPLANT
GLOVE BIOGEL PI IND STRL 7.5 (GLOVE) ×1 IMPLANT
GLOVE BIOGEL PI IND STRL 8 (GLOVE) ×1 IMPLANT
GLOVE BIOGEL PI INDICATOR 7.5 (GLOVE) ×2
GLOVE BIOGEL PI INDICATOR 8 (GLOVE) ×2
GLOVE ECLIPSE 8.0 STRL XLNG CF (GLOVE) ×6 IMPLANT
GLOVE ORTHO TXT STRL SZ7.5 (GLOVE) ×3 IMPLANT
GOWN BRE IMP PREV XXLGXLNG (GOWN DISPOSABLE) ×3 IMPLANT
GOWN STRL REIN 3XL XLG LVL4 (GOWN DISPOSABLE) ×3 IMPLANT
GOWN STRL REUS W/ TWL LRG LVL3 (GOWN DISPOSABLE) ×2 IMPLANT
GOWN STRL REUS W/TWL LRG LVL3 (GOWN DISPOSABLE) ×4
KIT BASIN OR (CUSTOM PROCEDURE TRAY) ×3 IMPLANT
KIT ROOM TURNOVER OR (KITS) ×3 IMPLANT
MANIFOLD NEPTUNE II (INSTRUMENTS) IMPLANT
NS IRRIG 1000ML POUR BTL (IV SOLUTION) ×3 IMPLANT
PACK TOTAL JOINT (CUSTOM PROCEDURE TRAY) ×3 IMPLANT
PAD ARMBOARD 7.5X6 YLW CONV (MISCELLANEOUS) ×6 IMPLANT
RESTRAINT LIMB HOLDER UNIV (RESTRAINTS) IMPLANT
RTRCTR WOUND ALEXIS 18CM MED (MISCELLANEOUS)
SPONGE GAUZE 2X2 STER 10/PKG (GAUZE/BANDAGES/DRESSINGS) ×2
SPONGE LAP 4X18 X RAY DECT (DISPOSABLE) IMPLANT
STAPLER VISISTAT 35W (STAPLE) IMPLANT
SUCTION FRAZIER TIP 10 FR DISP (SUCTIONS) ×3 IMPLANT
SUT MNCRL AB 4-0 PS2 18 (SUTURE) ×3 IMPLANT
SUT VIC AB 1 CT1 27 (SUTURE) ×2
SUT VIC AB 1 CT1 27XBRD ANBCTR (SUTURE) ×1 IMPLANT
SUT VIC AB 2-0 CT1 27 (SUTURE) ×4
SUT VIC AB 2-0 CT1 TAPERPNT 27 (SUTURE) ×2 IMPLANT
SUT VLOC 180 0 24IN GS25 (SUTURE) ×3 IMPLANT
TOWEL OR 17X24 6PK STRL BLUE (TOWEL DISPOSABLE) ×3 IMPLANT
TOWEL OR 17X26 10 PK STRL BLUE (TOWEL DISPOSABLE) ×3 IMPLANT
TRAY FOLEY CATH 16FRSI W/METER (SET/KITS/TRAYS/PACK) IMPLANT
WATER STERILE IRR 1000ML POUR (IV SOLUTION) IMPLANT

## 2014-09-12 NOTE — Interval H&P Note (Signed)
History and Physical Interval Note:  09/12/2014 11:12 AM  Frank Davidson  has presented today for surgery, with the diagnosis of left hip osteoarthitis  The various methods of treatment have been discussed with the patient and family. After consideration of risks, benefits and other options for treatment, the patient has consented to  Procedure(s): LEFT TOTAL HIP ARTHROPLASTY ANTERIOR APPROACH (Left) as a surgical intervention .  The patient's history has been reviewed, patient examined, no change in status, stable for surgery.  I have reviewed the patient's chart and labs.  Questions were answered to the patient's satisfaction.     [name]

## 2014-09-12 NOTE — Progress Notes (Signed)
Utilization review completed.  

## 2014-09-12 NOTE — Transfer of Care (Signed)
Immediate Anesthesia Transfer of Care Note  Patient: Frank Davidson  Procedure(s) Performed: Procedure(s): LEFT TOTAL HIP ARTHROPLASTY ANTERIOR APPROACH (Left)  Patient Location: PACU  Anesthesia Type:General  Level of Consciousness: awake, alert  and oriented  Airway & Oxygen Therapy: Patient Spontanous Breathing and Patient connected to nasal cannula oxygen  Post-op Assessment: Report given to PACU RN and Post -op Vital signs reviewed and stable  Post vital signs: Reviewed and stable  Complications: No apparent anesthesia complications

## 2014-09-12 NOTE — Anesthesia Postprocedure Evaluation (Signed)
  Anesthesia Post-op Note  Patient: Frank Davidson  Procedure(s) Performed: Procedure(s): LEFT TOTAL HIP ARTHROPLASTY ANTERIOR APPROACH (Left)  Patient Location: PACU  Anesthesia Type:General  Level of Consciousness: awake and alert   Airway and Oxygen Therapy: Patient Spontanous Breathing  Post-op Pain: moderate  Post-op Assessment: Post-op Vital signs reviewed, Patient's Cardiovascular Status Stable, Respiratory Function Stable, Patent Airway, No signs of Nausea or vomiting and Pain level controlled  Post-op Vital Signs: Reviewed and stable  Last Vitals:  Filed Vitals:   09/12/14 1438  BP:   Pulse: 93  Temp:   Resp: 10    Complications: No apparent anesthesia complications

## 2014-09-12 NOTE — Progress Notes (Signed)
Orthopedic Tech Progress Note Patient Details:  Frank Davidson [birth year] [id number] Applied ohf to bed Ortho Devices Ortho Device/Splint Interventions: Ordered;Application   Jennye Moccasin 09/12/2014, 8:46 PM

## 2014-09-12 NOTE — Op Note (Signed)
NAME:  Frank Davidson                ACCOUNT NO.: [id number]      MEDICAL RECORD NO.: [id number]      [facility]      PHYSICIAN:  Durene RomansLIN,Jorrell Kuster D  DATE OF BIRTH:  July 26, 1951     DATE OF PROCEDURE:  09/12/2014                                 OPERATIVE REPORT         PREOPERATIVE DIAGNOSIS: Left  hip osteoarthritis.      POSTOPERATIVE DIAGNOSIS:  Left hip osteoarthritis.      PROCEDURE:  Left total hip replacement through an anterior approach   utilizing DePuy THR system, component size 54mm pinnacle cup, a size 36+4 neutral   Altrex liner, a size 8 Hi Tri Lock stem with a 36+1.5 Articuleze metal head ball.      SURGEON:  Madlyn FrankelMatthew D. Charlann Boxerlin, M.D.      ASSISTANT:  Lanney GinsMatthew Babish, PA-C     ANESTHESIA:  General.      SPECIMENS:  None.      COMPLICATIONS:  None.      BLOOD LOSS:  1000 cc     DRAINS:  None.      INDICATION OF THE PROCEDURE:  Frank BloodgoodWayne X Davidson is a 63 y.o. male who had   presented to office for evaluation of left hip pain.  Radiographs revealed   progressive degenerative changes with bone-on-bone   articulation to the  hip joint.  The patient had painful limited range of   motion significantly affecting their overall quality of life.  The patient was failing to    respond to conservative measures, and at this point was ready   to proceed with more definitive measures.  The patient has noted progressive   degenerative changes in his hip, progressive problems and dysfunction   with regarding the hip prior to surgery.  Consent was obtained for   benefit of pain relief.  Specific risk of infection, DVT, component   failure, dislocation, need for revision surgery, as well discussion of   the anterior versus posterior approach were reviewed.  Consent was   obtained for benefit of anterior pain relief through an anterior   approach.  He has significant cardiac risk factors related to his history of heart failure.  He has been adamant  about having his replaced due to the significant reduction in his quality of life related to the amount of hip pain he was having.  He was optimized through his care with Cardiology pre-operatively.      PROCEDURE IN DETAIL:  The patient was brought to operative theater.   Once adequate anesthesia, preoperative antibiotics, 2gm of Ancef administered.   The patient was positioned supine on the OSI Hanna table.  Once adequate   padding of boney process was carried out, we had predraped out the hip, and  used fluoroscopy to confirm orientation of the pelvis and position.      The left hip was then prepped and draped from proximal iliac crest to   mid thigh with shower curtain technique.      Time-out was performed identifying the patient, planned procedure, and   extremity.     An incision was then made 2 cm distal and lateral to the   anterior superior iliac  spine extending over the orientation of the   tensor fascia lata muscle and sharp dissection was carried down to the   fascia of the muscle and protractor placed in the soft tissues.      The fascia was then incised.  The muscle belly was identified and swept   laterally and retractor placed along the superior neck.  Following   cauterization of the circumflex vessels and removing some pericapsular   fat, a second cobra retractor was placed on the inferior neck.  A third   retractor was placed on the anterior acetabulum after elevating the   anterior rectus.  A L-capsulotomy was along the line of the   superior neck to the trochanteric fossa, then extended proximally and   distally.  Tag sutures were placed and the retractors were then placed   intracapsular.  We then identified the trochanteric fossa and   orientation of my neck cut, confirmed this radiographically   and then made a neck osteotomy with the femur on traction.  The femoral   head was removed without difficulty or complication.  Traction was let   off and retractors  were placed posterior and anterior around the   acetabulum.      The labrum and foveal tissue were debrided.  I began reaming with a 43mm   reamer and reamed up to 56mm reamer with good bony bed preparation and a 62mm   cup was chosen.  The final 67mm Pinnacle cup was then impacted under fluoroscopy  to confirm the depth of penetration and orientation with respect to   abduction.  A screw was placed followed by the hole eliminator.  The final   36+4 neutral Altrex liner was impacted with good visualized rim fit.  The cup was positioned anatomically within the acetabular portion of the pelvis.      At this point, the femur was rolled at 80 degrees.  Further capsule was   released off the inferior aspect of the femoral neck.  I then   released the superior capsule proximally.  The hook was placed laterally   along the femur and elevated manually and held in position with the bed   hook.  The leg was then extended and adducted with the leg rolled to 100   degrees of external rotation.  Once the proximal femur was fully   exposed, I used a box osteotome to set orientation.  I then began   broaching with the starting chili pepper broach and passed this by hand and then broached up to 8.  With the 8 broach in place I chose a high offset neck and did a trial reduction.  The offset was appropriate, leg lengths   appeared to be equal, confirmed radiographically.   Given these findings, I went ahead and dislocated the hip, repositioned all   retractors and positioned the right hip in the extended and abducted position.  The final 8 Hi Tri Lock stem was   chosen and it was impacted down to the level of neck cut.  Based on this   and the trial reduction, a 36+1.5 Articuleze metal head ball was chosen and   impacted onto a clean and dry trunnion, and the hip was reduced.  The   hip had been irrigated throughout the case again at this point.  I did   reapproximate the superior capsular leaflet to the  anterior leaflet   using #1 Vicryl.  The fascia of the   tensor  fascia lata muscle was then reapproximated using #1 Vicryl.  The   remaining wound was closed with 2-0 Vicryl and running 4-0 Monocryl.   The hip was cleaned, dried, and dressed sterilely using Dermabond and   Aquacel dressing.  He was then brought   to recovery room in stable condition tolerating the procedure well.    Lanney Gins, PA-C was present for the entirety of the case involved from   preoperative positioning, perioperative retractor management, general   facilitation of the case, as well as primary wound closure as assistant.            Madlyn Frankel Charlann Boxer, M.D.        09/12/2014 11:17 AM

## 2014-09-12 NOTE — Evaluation (Signed)
Physical Therapy Evaluation Patient Details Name: Frank Davidson MRN: [id number] DOB: 01-Apr-1951 Today's Date: 09/12/2014   History of Present Illness  Patient is a 63 y/o male s/p L THA, anterior approach. WBAT LLE. PMH of DM, CAD, CHF, HTN, CVA and CKD stage III.   Clinical Impression  Patient s/p L THA and presents with post surgical deficits and pain LLE impacting safe mobility. Pt tolerated standing and taking some steps today with only Min A for balance/safety. Instructed pt in exercises to perform during the day to improve strength and reduce edema. Education provided on anterior hip precautions. Pt would benefit from acute PT to improve transfers, gait, balance and mobility so pt can maximize independence and return to PLOF. Pt willing to go to ST SNF if needed to improve mobility pending progress in acute therapy. If pt safely able to negotiate steps and ambulate with supervision, would benefit from HHPT and 24/7 at discharge.    Follow Up Recommendations SNF;Supervision/Assistance - 24 hour    Equipment Recommendations  Other (comment) (defer to SNF. )    Recommendations for Other Services OT consult     Precautions / Restrictions Precautions Precautions: Fall;Anterior Hip Restrictions Weight Bearing Restrictions: Yes LLE Weight Bearing: Weight bearing as tolerated      Mobility  Bed Mobility Overal bed mobility: Needs Assistance Bed Mobility: Supine to Sit;Sit to Supine     Supine to sit: Mod assist;HOB elevated Sit to supine: Min assist;HOB elevated   General bed mobility comments: Requires assist mobilizing LLE in/out of bed and assist with elevating trunk. Heavily use of rails. Increased time.  Transfers Overall transfer level: Needs assistance Equipment used: Rolling walker (2 wheeled) Transfers: Sit to/from Stand Sit to Stand: Min assist;From elevated surface         General transfer comment: Min A to rise and for steadying. VC for upright  posture.  Ambulation/Gait Ambulation/Gait assistance: Min assist Ambulation Distance (Feet): 4 Feet (x2 bouts) Assistive device: Rolling walker (2 wheeled) Gait Pattern/deviations: Step-to pattern;Decreased stance time - left;Trunk flexed;Decreased weight shift to left     General Gait Details: VC for posture and RW management. Min A for balance during gait. Able to side step to right and left ~4'. Decreased speed. Increased WB through BUEs. Elevated HR to 135 bpm.  Stairs            Wheelchair Mobility    Modified Rankin (Stroke Patients Only)       Balance Overall balance assessment: Needs assistance Sitting-balance support: Feet supported Sitting balance-Leahy Scale: Fair Sitting balance - Comments: Initially requires UE support as position of guarding however able to sit without UE support for ~10 minutes. Mild posterior lean during dynamic sitting. Postural control: Posterior lean Standing balance support: During functional activity;Bilateral upper extremity supported Standing balance-Leahy Scale: Poor Standing balance comment: Requires BUE support on RW for balance. Able to stand for a few seconds without UE support.                             Pertinent Vitals/Pain Pain Assessment: 0-10 Pain Score: 10-Worst pain ever (Reports 12/10) Pain Location: left hip Pain Descriptors / Indicators: Sore;Discomfort;Grimacing;Guarding Pain Intervention(s): Limited activity within patient's tolerance;Monitored during session;Repositioned;Ice applied    Home Living Family/patient expects to be discharged to:: Unsure Living Arrangements: Other relatives Available Help at Discharge: Family;Available 24 hours/day Type of Home: House Home Access: Stairs to enter Entrance Stairs-Rails: Right Entrance Stairs-Number of Steps:  3 Home Layout: One level Home Equipment: Walker - 2 wheels;Cane - single point      Prior Function Level of Independence: Independent with  assistive device(s);Needs assistance   Gait / Transfers Assistance Needed: Reports using RW vs SPC PTA.   ADL's / Homemaking Assistance Needed: Reports (I) with ADLs.         Hand Dominance        Extremity/Trunk Assessment   Upper Extremity Assessment: Overall WFL for tasks assessed           Lower Extremity Assessment: LLE deficits/detail;RLE deficits/detail RLE Deficits / Details: Strength WFL. LLE Deficits / Details: Decreased AROM hip flexion, knee flexion/extension; ankle AROM WFL. Generalized weakness however pt tolerated standing.     Communication   Communication: No difficulties  Cognition Arousal/Alertness: Awake/alert Behavior During Therapy: WFL for tasks assessed/performed Overall Cognitive Status: Within Functional Limits for tasks assessed (A&O x4. )                      General Comments General comments (skin integrity, edema, etc.): Surgical site - clean, dry and intact.    Exercises Total Joint Exercises Ankle Circles/Pumps: Both;10 reps;Supine Quad Sets: Both;10 reps;Supine      Assessment/Plan    PT Assessment Patient needs continued PT services  PT Diagnosis Difficulty walking;Generalized weakness;Acute pain   PT Problem List Decreased strength;Pain;Cardiopulmonary status limiting activity;Decreased range of motion;Impaired sensation;Decreased activity tolerance;Decreased balance;Decreased safety awareness;Decreased mobility;Decreased knowledge of precautions  PT Treatment Interventions Balance training;Gait training;Stair training;Patient/family education;Functional mobility training;Therapeutic activities;Therapeutic exercise   PT Goals (Current goals can be found in the Care Plan section) Acute Rehab PT Goals Patient Stated Goal: to get walking PT Goal Formulation: With patient Time For Goal Achievement: 09/26/14 Potential to Achieve Goals: Good    Frequency 7X/week   Barriers to discharge        Co-evaluation                End of Session Equipment Utilized During Treatment: Gait belt;Oxygen Activity Tolerance: Patient tolerated treatment well;Patient limited by pain Patient left: in bed;with call bell/phone within reach;with bed alarm set;with nursing/sitter in room Nurse Communication: Mobility status;Weight bearing status;Precautions         Time: 3536-1443 PT Time Calculation (min): 38 min   Charges:   PT Evaluation $Initial PT Evaluation Tier I: 1 Procedure PT Treatments $Gait Training: 8-22 mins $Therapeutic Activity: 8-22 mins   PT G CodesAlvie Heidelberg A 09/12/2014, 4:51 PM Alvie Heidelberg, PT, DPT [phone]

## 2014-09-12 NOTE — Anesthesia Preprocedure Evaluation (Addendum)
Anesthesia Evaluation  Patient identified by MRN, date of birth, ID band Patient awake    Reviewed: Allergy & Precautions, H&P , NPO status , Patient's Chart, lab work & pertinent test results, reviewed documented beta blocker date and time   Airway Mallampati: II      Dental  (+) Edentulous Upper, Partial Lower, Upper Dentures   Pulmonary sleep apnea and Continuous Positive Airway Pressure Ventilation , COPDformer smoker,          Cardiovascular hypertension, + CAD and +CHF     Neuro/Psych CVA, No Residual Symptoms    GI/Hepatic GERD-  ,  Endo/Other  diabetes, Type 2, Oral Hypoglycemic Agents  Renal/GU CRFRenal disease     Musculoskeletal  (+) Arthritis -, Fibromyalgia -  Abdominal   Peds  Hematology   Anesthesia Other Findings   Reproductive/Obstetrics                          Anesthesia Physical Anesthesia Plan  ASA: IV  Anesthesia Plan: General   Post-op Pain Management:    Induction: Intravenous  Airway Management Planned: Oral ETT  Additional Equipment:   Intra-op Plan:   Post-operative Plan: Extubation in OR  Informed Consent: I have reviewed the patients History and Physical, chart, labs and discussed the procedure including the risks, benefits and alternatives for the proposed anesthesia with the patient or authorized representative who has indicated his/her understanding and acceptance.   Dental advisory given  Plan Discussed with: CRNA, Anesthesiologist and Surgeon  Anesthesia Plan Comments:         Anesthesia Quick Evaluation

## 2014-09-13 ENCOUNTER — Encounter (HOSPITAL_COMMUNITY): Payer: Self-pay | Admitting: Orthopedic Surgery

## 2014-09-13 LAB — CBC
HEMATOCRIT: 26.2 % — AB (ref 39.0–52.0)
HEMOGLOBIN: 8.6 g/dL — AB (ref 13.0–17.0)
MCH: 28.6 pg (ref 26.0–34.0)
MCHC: 32.8 g/dL (ref 30.0–36.0)
MCV: 87 fL (ref 78.0–100.0)
Platelets: 187 10*3/uL (ref 150–400)
RBC: 3.01 MIL/uL — AB (ref 4.22–5.81)
RDW: 13.9 % (ref 11.5–15.5)
WBC: 7.3 10*3/uL (ref 4.0–10.5)

## 2014-09-13 LAB — GLUCOSE, CAPILLARY
GLUCOSE-CAPILLARY: 105 mg/dL — AB (ref 70–99)
Glucose-Capillary: 87 mg/dL (ref 70–99)
Glucose-Capillary: 90 mg/dL (ref 70–99)
Glucose-Capillary: 76 mg/dL (ref 70–99)

## 2014-09-13 LAB — BASIC METABOLIC PANEL
Anion gap: 13 (ref 5–15)
BUN: 33 mg/dL — ABNORMAL HIGH (ref 6–23)
CO2: 28 meq/L (ref 19–32)
Calcium: 8.8 mg/dL (ref 8.4–10.5)
Chloride: 97 mEq/L (ref 96–112)
Creatinine, Ser: 2.06 mg/dL — ABNORMAL HIGH (ref 0.50–1.35)
GFR calc Af Amer: 38 mL/min — ABNORMAL LOW (ref >90)
GFR calc non Af Amer: 32 mL/min — ABNORMAL LOW (ref >90)
GLUCOSE: 113 mg/dL — AB (ref 70–99)
Potassium: 5.3 mEq/L (ref 3.7–5.3)
SODIUM: 138 meq/L (ref 137–147)

## 2014-09-13 MED ORDER — VANCOMYCIN HCL IN DEXTROSE 1-5 GM/200ML-% IV SOLN
1000.0000 mg | Freq: Two times a day (BID) | INTRAVENOUS | Status: DC
  Administered 2014-09-13 – 2014-09-16 (×7): 1000 mg via INTRAVENOUS
  Filled 2014-09-13 (×8): qty 200

## 2014-09-13 MED ORDER — MUPIROCIN 2 % EX OINT
1.0000 "application " | TOPICAL_OINTMENT | Freq: Two times a day (BID) | CUTANEOUS | Status: DC
  Administered 2014-09-13 – 2014-09-16 (×6): 1 via NASAL
  Filled 2014-09-13: qty 22

## 2014-09-13 MED ORDER — CHLORHEXIDINE GLUCONATE CLOTH 2 % EX PADS
6.0000 | MEDICATED_PAD | Freq: Every day | CUTANEOUS | Status: DC
  Administered 2014-09-14 – 2014-09-16 (×2): 6 via TOPICAL

## 2014-09-13 NOTE — Clinical Social Work Placement (Addendum)
Clinical Social Work Department CLINICAL SOCIAL WORK PLACEMENT NOTE 09/13/2014  Patient:  BYRLE, GOBEN  Account Number:  [id number] Admit date:  09/12/2014  Clinical Social Worker:  Mosie Epstein  Date/time:  09/13/2014 03:05 PM  Clinical Social Work is seeking post-discharge placement for this patient at the following level of care:   SKILLED NURSING   (*CSW will update this form in Epic as items are completed)   09/13/2014  Patient/family provided with [facility] Department of Clinical Social Work's list of facilities offering this level of care within the geographic area requested by the patient (or if unable, by the patient's family).  09/13/2014  Patient/family informed of their freedom to choose among providers that offer the needed level of care, that participate in Medicare, Medicaid or managed care program needed by the patient, have an available bed and are willing to accept the patient.  09/13/2014  Patient/family informed of [facility]' ownership interest in [facility], as well as of the fact that they are under no obligation to receive care at this facility.  PASARR submitted to EDS on  PASARR number received on   FL2 transmitted to all facilities in geographic area requested by pt/family on  09/13/2014 FL2 transmitted to all facilities within larger geographic area on   Patient informed that his/her managed care company has contracts with or will negotiate with  certain facilities, including the following:     Patient/family informed of bed offers received: 09/14/14  Patient chooses bed at  Physician recommends and patient chooses bed at    Patient to be transferred to  on  09/14/2014 Patient to be transferred to facility by [facility] Patient and family notified of transfer on  Name of family member notified:    The following physician request were entered in Epic:  Additional Comments: PASARR previously existing. 09/14/14: CSW gave  patient bed offers and talked with patient about [facility] responding no as patient owes them money. This was discussed with patient. Patient reported that MD came to see him this morning and informed him that he would d/c on Monday.  Alvina Chou, LCSW   Marcelline Deist, LCSWA [phone]) Licensed Clinical Social Worker Orthopedics [phone]) and Surgical [phone])

## 2014-09-13 NOTE — Evaluation (Addendum)
Occupational Therapy Evaluation Patient Details Name: Frank Davidson MRN: [id number] DOB: 12-Dec-1950 Today's Date: 09/13/2014    History of Present Illness Patient is a 63 y/o male s/p L THA, anterior approach. WBAT LLE. PMH of DM, CAD, CHF, HTN, CVA and CKD stage III.    Clinical Impression   This 63 yo male admitted and underwent above presents to acute OT with anterior hip precautions, obesity, increased pain, decreased mobility all affecting his ability to care for himself as he did pta. He will benefit from acute OT with follow up OT at SNF to get to a Mod I/Independent level.    Follow Up Recommendations  SNF    Equipment Recommendations  3 in 1 bedside comode       Precautions / Restrictions Precautions Precautions: Fall;Anterior Hip Restrictions Weight Bearing Restrictions: Yes LLE Weight Bearing: Weight bearing as tolerated      Mobility Bed Mobility               General bed mobility comments: Pt sitting up on EOB upon my arrival  Transfers Overall transfer level: Needs assistance Equipment used: Rolling walker (2 wheeled) Transfers: Sit to/from Stand Sit to Stand:  (lowest bed height; stand>sit Mod A due to decreased control of descent)              Balance Overall balance assessment: Needs assistance Sitting-balance support: Feet supported;No upper extremity supported Sitting balance-Leahy Scale: Good     Standing balance support: Bilateral upper extremity supported Standing balance-Leahy Scale: Poor                              ADL Overall ADL's : Needs assistance/impaired Eating/Feeding: Independent;Sitting   Grooming: Set up;Sitting   Upper Body Bathing: Set up;Sitting   Lower Body Bathing: Moderate assistance;Sit to/from stand (without AE)   Upper Body Dressing : Set up;Sitting   Lower Body Dressing: Minimal assistance;With adaptive equipment;Sit to/from stand       Toileting- Water quality scientist and Hygiene:  Min guard;Adhering to hip precautions;Sit to/from stand   Scientist, research (medical):  (Pt reports he will sponge bath until he feels safe with using his tub bench)     General ADL Comments: Pt reports he will get AE kit from gift shop               Pertinent Vitals/Pain Pain Assessment: 0-10 Pain Score: 6  Pain Location: left hip Pain Descriptors / Indicators: Aching;Sore Pain Intervention(s): Monitored during session;Repositioned     Hand Dominance Left   Extremity/Trunk Assessment Upper Extremity Assessment Upper Extremity Assessment: Overall WFL for tasks assessed           Communication Communication Communication: No difficulties   Cognition Arousal/Alertness: Awake/alert Behavior During Therapy: WFL for tasks assessed/performed Overall Cognitive Status: Within Functional Limits for tasks assessed                                Home Living Family/patient expects to be discharged to:: Unsure Living Arrangements: Other relatives (niece) Available Help at Discharge: Family;Available 24 hours/day Type of Home: House Home Access: Stairs to enter [employer] of Steps: 3 Entrance Stairs-Rails: Right Home Layout: One level               Home Equipment: Walker - 2 wheels;Cane - single point;Tub bench   Additional Comments: Pt has been at [facility] before  Prior Functioning/Environment Level of Independence: Independent with assistive device(s)  Gait / Transfers Assistance Needed: Reports using RW vs SPC PTA.           OT Diagnosis: Generalized weakness;Acute pain   OT Problem List: Decreased strength;Decreased range of motion;Impaired balance (sitting and/or standing);Pain;Decreased knowledge of use of DME or AE   OT Treatment/Interventions: Self-care/ADL training;Patient/family education;Balance training;DME and/or AE instruction    OT Goals(Current goals can be found in the care plan section) Acute Rehab OT Goals Patient  Stated Goal: I may need to go to rehab OT Goal Formulation: With patient Time For Goal Achievement: 09/20/14 Potential to Achieve Goals: Good ADL Goals Pt Will Perform Grooming: with min guard assist;standing (2 tasks) Pt Will Perform Lower Body Dressing: with min guard assist;with adaptive equipment;sit to/from stand Pt Will Transfer to Toilet: ambulating;bedside commode;with min guard assist (over toliet) Pt Will Perform Toileting - Clothing Manipulation and hygiene: with min guard assist;sit to/from stand Additional ADL Goal #1: Pt will be S for in and OOB with HOB flat and no rail as is his set up at home  OT Frequency: Min 2X/week              End of Session Equipment Utilized During Treatment: Rolling walker  Activity Tolerance: Patient tolerated treatment well Patient left: in chair;with call bell/phone within reach;with family/visitor present   Time: 5072-2575 OT Time Calculation (min): 44 min Charges:  OT General Charges $OT Visit: 1 Procedure OT Evaluation $Initial OT Evaluation Tier I: 1 Procedure OT Treatments $Self Care/Home Management : 38-52 mins  Almon Register [phone] 09/13/2014, 9:41 AM

## 2014-09-13 NOTE — Progress Notes (Signed)
attempted

## 2014-09-13 NOTE — Progress Notes (Signed)
Patient ID: Frank Davidson, male   DOB: [birth year], 63 y.o.   MRN: [id number] Subjective: 1 Day Post-Op Procedure(s) (LRB): LEFT TOTAL HIP ARTHROPLASTY ANTERIOR APPROACH (Left)    Patient reports pain as mild to moderate.  Initially a decent amount of pain but it had improved during night  Objective:   VITALS:   Filed Vitals:   09/12/14 2058  BP: 108/49  Pulse: 94  Temp: 98.1 F (36.7 C)  Resp:     Neurovascular intact Incision: dressing C/D/I  LABS  Recent Labs  09/11/14 1404 09/13/14 0535  HGB 12.9* 8.6*  HCT 39.2 26.2*  WBC 6.2 7.3  PLT 248 187     Recent Labs  09/11/14 1404  NA 139  K 4.7  BUN 36*  CREATININE 1.91*  GLUCOSE 72     Recent Labs  09/11/14 1404  INR 1.01     Assessment/Plan: 1 Day Post-Op Procedure(s) (LRB): LEFT TOTAL HIP ARTHROPLASTY ANTERIOR APPROACH (Left)   Advance diet Up with therapy Plan for discharge tomorrow Discharge home with home health  Though doing well Orthopaedically, due to cardiac history will monitor through today and plan to discharge tomorrow  Seems stable from HD/volume standpoint, no difficulty with breathing; no major apparent fluid shift

## 2014-09-13 NOTE — Progress Notes (Signed)
Two unsuccessful attempts to start IV in patient's right forearm. Patient states it has always been difficult for healthcare staff to start an IV on him.  IV team called.

## 2014-09-13 NOTE — Progress Notes (Addendum)
Physical Therapy Treatment Patient Details Name: Frank Davidson MRN: [id number] DOB: [birth year] Today's Date: 09/13/2014    History of Present Illness Patient is a 63 y/o male s/p L THA, anterior approach. WBAT LLE. PMH of DM, CAD, CHF, HTN, CVA and CKD stage III.     PT Comments    Patient progressing with mobility and able to walk farther, but still high fall risk and fatigues quickly.  Also would have lower surfaces without armrests to rise from at home.  Feel best to transition through SNF prior to d/c home.  Patient seems to agree.  Will continue skilled PT in acute setting prior to d./c.  Follow Up Recommendations  SNF;Supervision/Assistance - 24 hour     Equipment Recommendations  None recommended by PT    Recommendations for Other Services       Precautions / Restrictions Precautions Precautions: Fall;Anterior Hip Restrictions LLE Weight Bearing: Weight bearing as tolerated    Mobility  Bed Mobility               General bed mobility comments: NT patient in chair  Transfers Overall transfer level: Needs assistance Equipment used: Rolling walker (2 wheeled) Transfers: Sit to/from Stand Sit to Stand: Min guard;Supervision         General transfer comment: from recliner and 3:1 both with armrests  Ambulation/Gait Ambulation/Gait assistance: Min guard;Min assist Ambulation Distance (Feet): 40 Feet (and 20') Assistive device: Rolling walker (2 wheeled) Gait Pattern/deviations: Step-to pattern;Decreased stride length;Trunk flexed     General Gait Details: flexed posture, increased distance from walker as progressed due to incresed flexion with gait; cues for safety and assist to keep walker close.  Initial assist to progress left LE, then patient independent   Stairs            Wheelchair Mobility    Modified Rankin (Stroke Patients Only)       Balance     Sitting balance-Leahy Scale: Good       Standing balance-Leahy Scale:  Poor Standing balance comment: flexed and heavy UE assist on walker                    Cognition Arousal/Alertness: Awake/alert Behavior During Therapy: WFL for tasks assessed/performed Overall Cognitive Status: Within Functional Limits for tasks assessed                      Exercises Total Joint Exercises Ankle Circles/Pumps: Both;10 reps;Supine Long Arc Quad: AAROM;Left;10 reps;Seated    General Comments        Pertinent Vitals/Pain Pain Score: 7  Pain Location: lt hip Pain Intervention(s): Monitored during session    Home Living                      Prior Function            PT Goals (current goals can now be found in the care plan section) Progress towards PT goals: Progressing toward goals    Frequency  Min 6X/week    PT Plan      Co-evaluation             End of Session Equipment Utilized During Treatment: Gait belt Activity Tolerance: Patient limited by fatigue Patient left: in chair;with call bell/phone within reach     Time: 8381-8403 PT Time Calculation (min): 33 min  Charges:  $Gait Training: 8-22 mins $Therapeutic Exercise: 8-22 mins  G Codes:      Ania Levay,CYNDI 09/13/2014, 3:08 PM Sheran Lawlessyndi Upton Russey, PT [phone] 09/13/2014

## 2014-09-13 NOTE — Clinical Documentation Improvement (Signed)
If appropriate please document appropriate diagnosis related to supporting information.   Possible Clinical Conditions?  Acute Blood Loss Anemia Aplastic anemia Precipitous drop in Hematocrit Acute on chronic blood loss anemia Other Condition Cannot Clinically Determine   Supporting Information: Per 10/ 22/15 Op. Note = Left total hip replacement through an anterior approach, EBL  1000cc.   IVF during surgery and postop.   Per 09/13/14 MD progress note= Though doing well Orthopaedically, due to cardiac history will monitor through today and plan to discharge tomorrow .     Seems stable from HD/volume standpoint, no difficulty with breathing; no major apparent fluid shift.    Patient's Labs  Per Lab History  Component     Latest Ref Rng 08/22/2014          Hemoglobin     13.0 - 17.0 g/dL 69.6  HCT     78.9 - 38.1 % 41.7   Labs during admission per op Component     Latest Ref Rng 09/11/2014         2:04 PM  Hemoglobin     13.0 - 17.0 g/dL 01.7 (L)  HCT     51.0 - 52.0 % 39.2   Labs postop Component     Latest Ref Rng 09/13/2014         5:35 AM  Hemoglobin     13.0 - 17.0 g/dL 8.6 (L)  HCT     25.8 - 52.0 % 26.2 (L)    Thank You, [name] ,RN Clinical Documentation Specialist:  [phone]  [facility]- Health Information Management

## 2014-09-13 NOTE — Clinical Social Work Psychosocial (Signed)
Clinical Social Work Department BRIEF PSYCHOSOCIAL ASSESSMENT 09/13/2014  Patient:  Frank Davidson, Frank Davidson     Account Number:  [id number]     Admit date:  09/12/2014  Clinical Social Worker:  Delrae Sawyers  Date/Time:  09/13/2014 03:01 PM  Referred by:  Physician  Date Referred:  09/13/2014 Referred for  SNF Placement   Other Referral:   none.   Interview type:  Patient Other interview type:   none.    PSYCHOSOCIAL DATA Living Status:  FAMILY Admitted from facility:   Level of care:   Primary support name:  Tometta Camara Primary support relationship to patient:  FAMILY Degree of support available:   Pt states he has a strong support system with pt's niece.    CURRENT CONCERNS Current Concerns  Post-Acute Placement   Other Concerns:   none.    SOCIAL WORK ASSESSMENT / PLAN CSW received referral for possible SNF placement at time of discharge. CSW met with pt at bedside to discuss discharge disposition. Pt stated he was aware of PT recommendation and agreeable. Pt informed CSW pt has previously completed short-term rehabiliation at [facility] and would be interested in returning. CSW to continue to follow and assist with discharge planning needs.   Assessment/plan status:  Psychosocial Support/Ongoing Assessment of Needs Other assessment/ plan:   none.   Information/referral to community resources:   [facility] bed offers.    PATIENT'S/FAMILY'S RESPONSE TO PLAN OF CARE: Pt understanding and agreeable to CSW plan of care. Pt expressed no further questions or concerns at this time.       Lubertha Sayres, [place] [phone]) Licensed Clinical Social Worker Orthopedics [phone]) and Surgical [phone])

## 2014-09-13 NOTE — Progress Notes (Signed)
Right forearm

## 2014-09-14 LAB — GLUCOSE, CAPILLARY
GLUCOSE-CAPILLARY: 89 mg/dL (ref 70–99)
Glucose-Capillary: 116 mg/dL — ABNORMAL HIGH (ref 70–99)
Glucose-Capillary: 66 mg/dL — ABNORMAL LOW (ref 70–99)
Glucose-Capillary: 111 mg/dL — ABNORMAL HIGH (ref 70–99)

## 2014-09-14 LAB — CBC
HEMATOCRIT: 21.7 % — AB (ref 39.0–52.0)
HEMOGLOBIN: 7.2 g/dL — AB (ref 13.0–17.0)
MCH: 28.7 pg (ref 26.0–34.0)
MCHC: 33.2 g/dL (ref 30.0–36.0)
MCV: 86.5 fL (ref 78.0–100.0)
Platelets: 172 10*3/uL (ref 150–400)
RBC: 2.51 MIL/uL — ABNORMAL LOW (ref 4.22–5.81)
RDW: 13.9 % (ref 11.5–15.5)
WBC: 9.3 10*3/uL (ref 4.0–10.5)

## 2014-09-14 LAB — BASIC METABOLIC PANEL
Anion gap: 13 (ref 5–15)
BUN: 41 mg/dL — AB (ref 6–23)
CALCIUM: 8.9 mg/dL (ref 8.4–10.5)
CO2: 25 mEq/L (ref 19–32)
Chloride: 95 mEq/L — ABNORMAL LOW (ref 96–112)
Creatinine, Ser: 2.37 mg/dL — ABNORMAL HIGH (ref 0.50–1.35)
GFR calc Af Amer: 32 mL/min — ABNORMAL LOW (ref >90)
GFR calc non Af Amer: 27 mL/min — ABNORMAL LOW (ref >90)
GLUCOSE: 98 mg/dL (ref 70–99)
POTASSIUM: 4.7 meq/L (ref 3.7–5.3)
Sodium: 133 mEq/L — ABNORMAL LOW (ref 137–147)

## 2014-09-14 LAB — PREPARE RBC (CROSSMATCH)

## 2014-09-14 MED ORDER — SODIUM CHLORIDE 0.9 % IV SOLN: Freq: Once | INTRAVENOUS | Status: DC

## 2014-09-14 MED ORDER — FUROSEMIDE 10 MG/ML IJ SOLN
10.0000 mg | Freq: Once | INTRAMUSCULAR | Status: AC
Start: 2014-09-14 — End: 2014-09-14
  Administered 2014-09-14: 10 mg via INTRAVENOUS
  Filled 2014-09-14: qty 2

## 2014-09-14 NOTE — Care Management Note (Signed)
CARE MANAGEMENT NOTE 09/14/2014  Patient:  Frank Davidson, Frank Davidson   Account Number:  [id number]  Date Initiated:  09/14/2014  Documentation initiated by:  Barrett Shell  Subjective/Objective Assessment:   63 yr old male admitted with osteoarthritis of the left hip. Patient s/p left total hip arthroplasty.     Action/Plan:   Patient will need shortterm rehab at [facility].Social worker notified.   Anticipated DC Date:  09/16/2014   Anticipated DC Plan:  SKILLED NURSING FACILITY  In-house referral  Clinical Social Worker      DC Planning Services  CM consult      [facility] Choice  NA   Choice offered to / List presented to:     DME arranged  NA        HH arranged  NA      Status of service:  In process, will continue to follow Medicare Important Message given?   (If response is "NO", the following Medicare IM given date fields will be blank) Date Medicare IM given:   Medicare IM given by:   Date Additional Medicare IM given:   Additional Medicare IM given by:    Discharge Disposition:  SKILLED NURSING FACILITY  Per UR Regulation:  Reviewed for med. necessity/level of care/duration of stay

## 2014-09-14 NOTE — Progress Notes (Signed)
Physical Therapy Treatment Patient Details Name: Frank Davidson MRN: [id number] DOB: 1951/02/25 Today's Date: 09/14/2014    History of Present Illness Patient is a 63 y/o male s/p L THA, anterior approach. WBAT LLE. PMH of DM, CAD, CHF, HTN, CVA and CKD stage III.     PT Comments    Patient progressing well and motivated. Limited ambulation due to Hgb being 7.2. Patient to receive blood later today. Continue with current POC  Follow Up Recommendations  SNF;Supervision/Assistance - 24 hour     Equipment Recommendations  None recommended by PT    Recommendations for Other Services       Precautions / Restrictions Precautions Precautions: Fall;Anterior Hip Restrictions LLE Weight Bearing: Weight bearing as tolerated    Mobility  Bed Mobility               General bed mobility comments: Patient sitting EOB upon arrival  Transfers Overall transfer level: Needs assistance Equipment used: Rolling walker (2 wheeled) Transfers: Sit to/from Stand Sit to Stand: Min guard         General transfer comment: Cues for safe technique.   Ambulation/Gait Ambulation/Gait assistance: Min guard Ambulation Distance (Feet): 40 Feet Assistive device: Rolling walker (2 wheeled) Gait Pattern/deviations: Step-to pattern;Trunk flexed   Gait velocity interpretation: Below normal speed for age/gender General Gait Details: Cues for upright posture and gait sequence. Cues to keep RW closer to his body.    Stairs            Wheelchair Mobility    Modified Rankin (Stroke Patients Only)       Balance                                    Cognition Arousal/Alertness: Awake/alert Behavior During Therapy: WFL for tasks assessed/performed Overall Cognitive Status: Within Functional Limits for tasks assessed                      Exercises Total Joint Exercises Ankle Circles/Pumps: Both;10 reps Quad Sets: Both;10 reps Heel Slides: AAROM;Left;10  reps Long Arc Quad: Left;10 reps;Seated;AROM    General Comments        Pertinent Vitals/Pain Pain Score: 3  Pain Location: L hip Pain Descriptors / Indicators: Sore Pain Intervention(s): Monitored during session    Home Living                      Prior Function            PT Goals (current goals can now be found in the care plan section) Progress towards PT goals: Progressing toward goals    Frequency  Min 6X/week    PT Plan Current plan remains appropriate    Co-evaluation             End of Session Equipment Utilized During Treatment: Gait belt Activity Tolerance: Patient tolerated treatment well;Other (comment) (low HGB) Patient left: in chair;with call bell/phone within reach     Time: 0754-0820 PT Time Calculation (min): 26 min  Charges:  $Gait Training: 8-22 mins $Therapeutic Exercise: 8-22 mins                    G Codes:      Fredrich Birks 09/14/2014, 8:26 AM 09/14/2014 Fredrich Birks PTA [phone] pager [phone] office

## 2014-09-14 NOTE — Progress Notes (Signed)
Patient ID: Frank Davidson, male   DOB: Aug 21, 1951, 63 y.o.   MRN: [id number] Subjective: 2 Days Post-Op Procedure(s) (LRB): LEFT TOTAL HIP ARTHROPLASTY ANTERIOR APPROACH (Left)    Patient reports pain as mild to moderate.  Thigh pain more than anything else.  Objective:   VITALS:   Filed Vitals:   09/14/14 0535  BP: 99/49  Pulse: 91  Temp:   Resp:     Neurovascular intact Incision: dressing C/D/I  Sitting at bedside this am.  Had nice conversation about situation and plans  LABS  Recent Labs  09/11/14 1404 09/13/14 0535 09/14/14 0443  HGB 12.9* 8.6* 7.2*  HCT 39.2 26.2* 21.7*  WBC 6.2 7.3 9.3  PLT 248 187 172     Recent Labs  09/11/14 1404 09/13/14 0535 09/14/14 0443  NA 139 138 133*  K 4.7 5.3 4.7  BUN 36* 33* 41*  CREATININE 1.91* 2.06* 2.37*  GLUCOSE 72 113* 98     Recent Labs  09/11/14 1404  INR 1.01     Assessment/Plan: 2 Days Post-Op Procedure(s) (LRB): LEFT TOTAL HIP ARTHROPLASTY ANTERIOR APPROACH (Left)   Up with therapy, continue in bed activity  Acute blood loss anemia - due to drop in Hgb and resulting hypotension I will plan to give 2 units PRBCs today with IV lasix between due to CHF  Discharge plan pending continued therapy.  Has discussed possibility of ST SNF versus home.  I told him that due to need for blood we will see how today and even tomorrow with regards to activity before deciding best plan

## 2014-09-15 LAB — TYPE AND SCREEN
ABO/RH(D): O NEG
Antibody Screen: NEGATIVE
Unit division: 0
Unit division: 0

## 2014-09-15 LAB — BASIC METABOLIC PANEL
Anion gap: 13 (ref 5–15)
BUN: 41 mg/dL — ABNORMAL HIGH (ref 6–23)
CALCIUM: 9.4 mg/dL (ref 8.4–10.5)
CO2: 27 meq/L (ref 19–32)
CREATININE: 2.21 mg/dL — AB (ref 0.50–1.35)
Chloride: 92 mEq/L — ABNORMAL LOW (ref 96–112)
GFR calc Af Amer: 34 mL/min — ABNORMAL LOW (ref >90)
GFR, EST NON AFRICAN AMERICAN: 30 mL/min — AB (ref >90)
GLUCOSE: 69 mg/dL — AB (ref 70–99)
Potassium: 4.6 mEq/L (ref 3.7–5.3)
Sodium: 132 mEq/L — ABNORMAL LOW (ref 137–147)

## 2014-09-15 LAB — CBC
HCT: 27.8 % — ABNORMAL LOW (ref 39.0–52.0)
HEMOGLOBIN: 9.2 g/dL — AB (ref 13.0–17.0)
MCH: 28.2 pg (ref 26.0–34.0)
MCHC: 33.1 g/dL (ref 30.0–36.0)
MCV: 85.3 fL (ref 78.0–100.0)
Platelets: 168 10*3/uL (ref 150–400)
RBC: 3.26 MIL/uL — ABNORMAL LOW (ref 4.22–5.81)
RDW: 13.5 % (ref 11.5–15.5)
WBC: 9.9 10*3/uL (ref 4.0–10.5)

## 2014-09-15 LAB — GLUCOSE, CAPILLARY
GLUCOSE-CAPILLARY: 123 mg/dL — AB (ref 70–99)
GLUCOSE-CAPILLARY: 104 mg/dL — AB (ref 70–99)
Glucose-Capillary: 77 mg/dL (ref 70–99)
Glucose-Capillary: 94 mg/dL (ref 70–99)

## 2014-09-15 NOTE — Progress Notes (Signed)
CSW Intern attempted to discuss bed offers with p/t. P/t stated that he will be d/c home with home health, as his condition has improved, per MD. P/t stated he will keep bed offers, just in case circumstances change. Unit CSW will follow.

## 2014-09-15 NOTE — Discharge Instructions (Signed)
Information on my medicine - ELIQUIS® (apixaban) ° °This medication education was reviewed with me or my healthcare representative as part of my discharge preparation.  The pharmacist that spoke with me during my hospital stay was:  Stewart, Kaeleb Emond L, RPH ° °Why was Eliquis® prescribed for you? °Eliquis® was prescribed for you to reduce the risk of a blood clot forming that can cause a stroke if you have a medical condition called atrial fibrillation (a type of irregular heartbeat). ° °What do You need to know about Eliquis® ? °Take your Eliquis® TWICE DAILY - one tablet in the morning and one tablet in the evening with or without food. If you have difficulty swallowing the tablet whole please discuss with your pharmacist how to take the medication safely. ° °Take Eliquis® exactly as prescribed by your doctor and DO NOT stop taking Eliquis® without talking to the doctor who prescribed the medication.  Stopping may increase your risk of developing a stroke.  Refill your prescription before you run out. ° °After discharge, you should have regular check-up appointments with your healthcare provider that is prescribing your Eliquis®.  In the future your dose may need to be changed if your kidney function or weight changes by a significant amount or as you get older. ° °What do you do if you miss a dose? °If you miss a dose, take it as soon as you remember on the same day and resume taking twice daily.  Do not take more than one dose of ELIQUIS at the same time to make up a missed dose. ° °Important Safety Information °A possible side effect of Eliquis® is bleeding. You should call your healthcare provider right away if you experience any of the following: °  Bleeding from an injury or your nose that does not stop. °  Unusual colored urine (red or dark brown) or unusual colored stools (red or black). °  Unusual bruising for unknown reasons. °  A serious fall or if you hit your head (even if there is no bleeding). ° °Some  medicines may interact with Eliquis® and might increase your risk of bleeding or clotting while on Eliquis®. To help avoid this, consult your healthcare provider or pharmacist prior to using any new prescription or non-prescription medications, including herbals, vitamins, non-steroidal anti-inflammatory drugs (NSAIDs) and supplements. ° °This website has more information on Eliquis® (apixaban): http://www.eliquis.com/eliquis/home °

## 2014-09-15 NOTE — Progress Notes (Signed)
Physical Therapy Treatment Patient Details Name: Frank Davidson MRN: [id number] DOB: [birth year] Today's Date: 10-06-14    History of Present Illness Patient is a 63 y/o male s/p L THA, anterior approach. WBAT LLE. PMH of DM, CAD, CHF, HTN, CVA and CKD stage III.     PT Comments    Pt making great progress with mobility. Pt safe at current mobiliyt level to discharge home with HHPT and family assistance. Discharge plan updated to reflect this change.  Follow Up Recommendations  Home health PT;Supervision/Assistance - 24 hour     Equipment Recommendations  None recommended by PT    Recommendations for Other Services OT consult     Precautions / Restrictions Precautions Precautions: Fall;Anterior Hip Restrictions LLE Weight Bearing: Weight bearing as tolerated    Mobility  Bed Mobility Overal bed mobility: Needs Assistance Bed Mobility: Supine to Sit;Sit to Supine     Supine to sit: Supervision Sit to supine: Supervision   General bed mobility comments: bed placed flat with no rails. Pt educated to use a belt (sheet/towel at home) to move left leg onto and off bed. cues on sequence and technique and to breathe with activity (tends to hold breath with exertion)  Transfers Overall transfer level: Needs assistance Equipment used: Rolling walker (2 wheeled) Transfers: Sit to/from Stand Sit to Stand: Supervision         General transfer comment: from bed/toilet and to toilet/recliner. demo'd safe technique each time with increased time needed with transfers.  Ambulation/Gait Ambulation/Gait assistance: Min guard;Supervision Ambulation Distance (Feet): 100 Feet Assistive device: Rolling walker (2 wheeled) Gait Pattern/deviations: Step-through pattern;Antalgic;Trunk flexed Gait velocity: decreased Gait velocity interpretation: Below normal speed for age/gender General Gait Details: cues for posture and walker position with gait.   Stairs             Wheelchair Mobility    Modified Rankin (Stroke Patients Only)          Cognition Arousal/Alertness: Awake/alert Behavior During Therapy: WFL for tasks assessed/performed Overall Cognitive Status: Within Functional Limits for tasks assessed                       Pertinent Vitals/Pain Pain Assessment: 0-10 Pain Score: 3  Pain Location: left hip Pain Descriptors / Indicators: Sore Pain Intervention(s): Premedicated before session;Monitored during session;Repositioned        PT Goals (current goals can now be found in the care plan section) Acute Rehab PT Goals Patient Stated Goal: To go home with family help on Monday PT Goal Formulation: With patient Time For Goal Achievement: 09/26/14 Potential to Achieve Goals: Good Progress towards PT goals: Progressing toward goals    Frequency  Min 6X/week    PT Plan Current plan remains appropriate;Discharge plan needs to be updated       End of Session Equipment Utilized During Treatment: Gait belt Activity Tolerance: Patient tolerated treatment well Patient left: in chair;with call bell/phone within reach     Time: 1040-1103 PT Time Calculation (min): 23 min  Charges:  $Gait Training: 8-22 mins $Therapeutic Activity: 8-22 mins                    G Codes:      Sallyanne Kuster 2014-10-06, 11:08 AM  Sallyanne Kuster, PTA Office- [phone]

## 2014-09-15 NOTE — Progress Notes (Signed)
Patient ID: Frank Davidson, male   DOB: Mar 10, 1951, 63 y.o.   MRN: [id number] Subjective: 3 Days Post-Op Procedure(s) (LRB): LEFT TOTAL HIP ARTHROPLASTY ANTERIOR APPROACH (Left)    Patient reports pain as mild. Still major challenge is getting out of bed  Objective:   VITALS:   Filed Vitals:   09/15/14 0501  BP: 102/49  Pulse: 79  Temp: 97.3 F (36.3 C)  Resp: 18    Neurovascular intact Incision: dressing C/D/I  LABS  Recent Labs  09/13/14 0535 09/14/14 0443 09/15/14 0435  HGB 8.6* 7.2* 9.2*  HCT 26.2* 21.7* 27.8*  WBC 7.3 9.3 9.9  PLT 187 172 168     Recent Labs  09/13/14 0535 09/14/14 0443 09/15/14 0435  NA 138 133* 132*  K 5.3 4.7 4.6  BUN 33* 41* 41*  CREATININE 2.06* 2.37* 2.21*  GLUCOSE 113* 98 69*    No results found for this basename: LABPT, INR,  in the last 72 hours   Assessment/Plan: 3 Days Post-Op Procedure(s) (LRB): LEFT TOTAL HIP ARTHROPLASTY ANTERIOR APPROACH (Left)   Up with therapy Plan for discharge tomorrow  ABLA - appropriate return of HGB level after 2 units PRBCs, continue IRON, and observation  Plan is to continue therapy today practicing bed mobility Would like to go home if functionally able. Will be discharged to home or ST SNF tomorrow

## 2014-09-15 NOTE — Progress Notes (Signed)
Discussed and agree with updated discharge plan.  .09/15/2014 Corlis Hove, PT [phone]

## 2014-09-15 NOTE — Progress Notes (Signed)
Patient got up by himself several times today. Advised patient each time to call a staff member to take him to the bathroom and back. Each time patient said "okay" and still did not call anyone for assistance. Patient stated that he has been using the walker for a year and knows how to move around. Advised patient that the staff did not want him to fall and part of our job is to keep him safe. Patient agreed to call staff when he needed to use the bathroom.  Bed alarm will be placed and will continue to monitor.

## 2014-09-16 LAB — GLUCOSE, CAPILLARY
GLUCOSE-CAPILLARY: 106 mg/dL — AB (ref 70–99)
Glucose-Capillary: 89 mg/dL (ref 70–99)

## 2014-09-16 MED ORDER — FERROUS SULFATE 325 (65 FE) MG PO TABS: 325.0000 mg | ORAL_TABLET | Freq: Three times a day (TID) | ORAL | Status: DC

## 2014-09-16 MED ORDER — DSS 100 MG PO CAPS: 100.0000 mg | ORAL_CAPSULE | Freq: Two times a day (BID) | ORAL | Status: AC

## 2014-09-16 MED ORDER — OXYCODONE HCL 5 MG PO TABS: 5.0000 mg | ORAL_TABLET | ORAL | Status: DC

## 2014-09-16 MED ORDER — POLYETHYLENE GLYCOL 3350 17 G PO PACK: 17.0000 g | PACK | Freq: Two times a day (BID) | ORAL | Status: DC

## 2014-09-16 NOTE — Progress Notes (Signed)
Patient ID: Frank Davidson, male   DOB: [birth year], 63 y.o.   MRN: [id number] Subjective: 4 Days Post-Op Procedure(s) (LRB): LEFT TOTAL HIP ARTHROPLASTY ANTERIOR APPROACH (Left)    Patient reports pain as mild.  Doing better with therapy yesterday, ready to go home instead of SNF.  No events, no issues from cardiac standpoint  Objective:   VITALS:   Filed Vitals:   09/16/14 0450  BP: 99/60  Pulse: 84  Temp: 98.4 F (36.9 C)  Resp: 18    Neurovascular intact Incision: dressing C/D/I  LABS  Recent Labs  09/14/14 0443 09/15/14 0435  HGB 7.2* 9.2*  HCT 21.7* 27.8*  WBC 9.3 9.9  PLT 172 168     Recent Labs  09/14/14 0443 09/15/14 0435  NA 133* 132*  K 4.7 4.6  BUN 41* 41*  CREATININE 2.37* 2.21*  GLUCOSE 98 69*    No results found for this basename: LABPT, INR,  in the last 72 hours   Assessment/Plan: 4 Days Post-Op Procedure(s) (LRB): LEFT TOTAL HIP ARTHROPLASTY ANTERIOR APPROACH (Left)   Up with therapy Discharge home with home health today. RTC in 2 weeks

## 2014-09-16 NOTE — Progress Notes (Signed)
Physical Therapy Treatment Patient Details Name: Frank Davidson MRN: [id number] DOB: 07/19/1951 Today's Date: 09/16/2014    History of Present Illness Patient is a 63 y/o male s/p L THA, anterior approach. WBAT LLE. PMH of DM, CAD, CHF, HTN, CVA and CKD stage III.     PT Comments    Making good progress with mobility; stair training complete; Questions answered; OK for dc home from PT standpoint   Follow Up Recommendations  Home health PT;Supervision/Assistance - 24 hour     Equipment Recommendations  None recommended by PT    Recommendations for Other Services       Precautions / Restrictions Precautions Precautions: Fall (verified with PA that they used the direct anterior approach) Precaution Comments: Fall risk decr Restrictions LLE Weight Bearing: Weight bearing as tolerated    Mobility  Bed Mobility                  Transfers Overall transfer level: Needs assistance Equipment used: Rolling walker (2 wheeled) Transfers: Sit to/from Stand Sit to Stand: Supervision         General transfer comment: Cues for hand placement and control  Ambulation/Gait Ambulation/Gait assistance: Supervision Ambulation Distance (Feet): 120 Feet Assistive device: Rolling walker (2 wheeled) Gait Pattern/deviations: Step-through pattern Gait velocity: decreased   General Gait Details: cues for posture and walker position with gait.   Stairs Stairs: Yes Stairs assistance: Min guard Stair Management: One rail Left;Step to pattern;Sideways Number of Stairs: 4 General stair comments: Verbal and demo cues for technigue  Wheelchair Mobility    Modified Rankin (Stroke Patients Only)       Balance             Standing balance-Leahy Scale: Fair                      Cognition Arousal/Alertness: Awake/alert Behavior During Therapy: WFL for tasks assessed/performed Overall Cognitive Status: Within Functional Limits for tasks assessed                      Exercises      General Comments General comments (skin integrity, edema, etc.): Clarified for pt that he had the direct anterior approach and that he has no motion restrictions and is WBAT      Pertinent Vitals/Pain Pain Assessment: 0-10 Pain Score: 3  Pain Location: L hip Pain Descriptors / Indicators: Aching Pain Intervention(s): Limited activity within patient's tolerance    Home Living                      Prior Function            PT Goals (current goals can now be found in the care plan section) Acute Rehab PT Goals Patient Stated Goal: To go home with family help on Monday PT Goal Formulation: With patient Time For Goal Achievement: 09/26/14 Potential to Achieve Goals: Good Progress towards PT goals: Progressing toward goals    Frequency  Min 6X/week    PT Plan Current plan remains appropriate    Co-evaluation             End of Session   Activity Tolerance: Patient tolerated treatment well Patient left: in chair;with call bell/phone within reach     Time: 1200-1224 PT Time Calculation (min): 24 min  Charges:  $Gait Training: 23-37 mins  G Codes:      Van Clines Hamff 09/16/2014, 1:22 PM  Van Clines, [place]  Acute Rehabilitation Services Pager [phone] Office [phone]

## 2014-09-16 NOTE — Care Management Note (Signed)
CARE MANAGEMENT NOTE 09/16/2014  Patient:  Frank Davidson, Frank Davidson   Account Number:  [id number]  Date Initiated:  09/14/2014  Documentation initiated by:  Vance Peper  Subjective/Objective Assessment:   63 yr old male admitted with osteoarthritis of the left hip. Patient s/p left total hip arthroplasty.     Action/Plan:   Case manager spoke with patient concerning home health and DME needs at discharge. Choice offered. Referral called to Amy, [facility] liaison. Patient has asistance at discharge.   Anticipated DC Date:  09/16/2014   Anticipated DC Plan:  HOME W HOME HEALTH SERVICES  In-house referral  Clinical Social Worker      DC Planning Services  CM consult      PAC Choice  DURABLE MEDICAL EQUIPMENT  HOME HEALTH   Choice offered to / List presented to:  C-1 Patient   DME arranged  3-N-1      DME agency  [employer]     HH arranged  HH-2 PT      HH agency  [facility]   Status of service:  Completed, signed off Medicare Important Message given?  YES (If response is "NO", the following Medicare IM given date fields will be blank) Date Medicare IM given:  09/16/2014 Medicare IM given by:  Vance Peper Date Additional Medicare IM given:   Additional Medicare IM given by:    Discharge Disposition:  HOME W HOME HEALTH SERVICES  Per UR Regulation:  Reviewed for med. necessity/level of care/duration of stay

## 2014-09-17 ENCOUNTER — Telehealth (HOSPITAL_COMMUNITY): Payer: Self-pay | Admitting: Vascular Surgery

## 2014-09-17 NOTE — Telephone Encounter (Signed)
Pt called he has gained 10 lbs since being in the hospital for hip surgery pt wants to know if he needs to seen or adjust some medications please advise

## 2014-09-17 NOTE — Telephone Encounter (Signed)
Spoke w/pt he states wt is up about 10 lb since 10/22 when he had his surgery, he states he is a little more SOB and does have some LE edema he is at home now, he will take a metolazone tomorrow AM if wt does not improve he will call us back, f/u appt sch for 11/11

## 2014-09-18 ENCOUNTER — Encounter: Payer: Self-pay | Admitting: Internal Medicine

## 2014-09-19 NOTE — Discharge Summary (Signed)
Physician Discharge Summary  Patient ID: Frank Davidson MRN: [id number] DOB/AGE: [birth year] 64 y.o.  Admit date: 09/12/2014 Discharge date: 09/16/2014   Procedures:  Procedure(s) (LRB): LEFT TOTAL HIP ARTHROPLASTY ANTERIOR APPROACH (Left)  Attending Physician:  Dr. Durene RomansMatthew Olin   Admission Diagnoses:   Left hip OA / pain   Discharge Diagnoses:  Principal Problem:   S/P left THA, AA  Past Medical History  Diagnosis Date  . Atrial fibrillation   . Hypertension   . COPD (chronic obstructive pulmonary disease)   . Coronary artery disease   . Ocular herpes   . Gout   . CHF (congestive heart failure)     EF 15%  . Chronic pain   . Hyperlipidemia   . Nonischemic cardiomyopathy   . Ocular herpes   . Neuropathy   . Left-sided weakness     "because of the arthritis and edema"  . Acute on chronic systolic heart failure   . Decreased pedal pulses   . Memory impairment     "short term"  . CKD (chronic kidney disease), stage III     Dr. Briant CedarMattingly follows-holding   . Fibromyalgia     neuropathy- hands, more than feet.  . Sleep apnea with use of continuous positive airway pressure (CPAP)     does not use cpap  . GERD (gastroesophageal reflux disease)     tums as needed  . Asthma   . Type II diabetes mellitus   . Stroke X 5    last stroke aug 2014"; denies residual on 09/12/2014  . Osteoarthritis   . Arthritis     "knees, right elbow, shoulder" (09/12/2014)    HPI: Frank BloodgoodWayne X Fujimoto, 63 y.o. male, has a history of pain and functional disability in the left hip(s) due to arthritis.  He is known radiographically and clinically to have advanced left hip osteoarthritis. He was scheduled previously but do to unfortunate medical course requiring two months hospitalization and four months of rehab following complications related to medication use, his case was cancelled. He has been worked up extensively by cardiology and though everybody feels he is extremely high risks, he  is optimized. He severe high risks have been reviewed with the patient many times, but do to the incapacitating hip pain he is experiencing he would like to proceed with surgery. He is noted to have left hip pain and a work up having had advanced left hip osteoarthritis. In the office on exam he is walking with the use of a walker in the office but he uses a cane predominantly. He has a very limited range of motion of his left hip with pain with hip flexion and internal rotation. The right hip otherwise moves fluidly. He does appear to be deconditioned. Dr. Charlann Boxerlin reviewed with Mr. Micael HampshireDavenport his current situation with regards to his left hip. He has multiple medical issues going on, the only way his hip will improve, if his quality of life is significantly affected by this, is to perform a left total hip arthroplasty. He wishes to consider these options. I would not want him to go through this unless he understood the medical co-morbidities and risks associated with this. This would include followup with all of his coordinating physicians but would predominantly focus with Cardiology on his heart failure issues as well as his diabetes which he states is currently well managed despite the attempts at trying to initiate new treatment with [employer]. Dr. Charlann Boxerlin once again talked to Mr. Micael HampshireDavenport once  we had the report from his Cardiologist saying he was a high risk candidate for any surgery. He states that he understands the high risk classification and associated risks and still wishes to proceed with surgery. Questions were encouraged, answered and reviewed. Risks, benefits and expectations were discussed with the patient. Risks including but not limited to the risk of anesthesia, blood clots, nerve damage, blood vessel damage, failure of the prosthesis, infection and up to and including death. Patient understand the risks, benefits and expectations and wishes to proceed with surgery.   PCP: Abbe Amsterdam, MD    Discharged Condition: good  Hospital Course:  Patient underwent the above stated procedure on 09/12/2014. Patient tolerated the procedure well and brought to the recovery room in good condition and subsequently to the floor.  POD #1 BP: 108/49 ; Pulse: 94 ; Temp: 98.1 F (36.7 C) ;  Patient reports pain as mild to moderate. Initially a decent amount of pain but it had improved during night Neurovascular intact and incision: dressing C/D/I.  LABS  Basename    HGB  8.6  HCT  26.2   POD #2  BP: 99/49 ; Pulse: 91  Patient reports pain as mild to moderate. Thigh pain more than anything else. ABLA received blood. Neurovascular intact and incision: dressing C/D/I.  LABS  Basename    HGB  7.2  HCT  21.7   POD #3  BP: 102/49 ; Pulse: 79 ; Temp: 97.3 F (36.3 C) ; Resp: 18 Patient reports pain as mild. Still major challenge is getting out of bed Neurovascular intact and incision: dressing C/D/I.  LABS  Basename    HGB  9.2  HCT  27.8   POD #4  BP: 99/60 ; Pulse: 84 ; Temp: 98.4 F (36.9 C) ; Resp: 18  Patient reports pain as mild. Doing better with therapy yesterday, ready to go home instead of SNF. No events, no issues from cardiac standpoint Neurovascular intact and incision: dressing C/D/I.  LABS   No new labs   Discharge Exam: General appearance: alert, cooperative and no distress Extremities: Homans sign is negative, no sign of DVT, no edema, redness or tenderness in the calves or thighs and no ulcers, gangrene or trophic changes  Disposition:   Home with follow up in 2 weeks   Follow-up Information   Follow up with [name], MD. Schedule an appointment as soon as possible for a visit in 2 weeks.   Specialty:  Orthopedic Surgery   Contact information:   [address] [place] [address] [phone]       Follow up with [facility]. (Someone from [facility] will contact you concerning start date and time  for phsyical therapy.)    Contact information:   [address] [place] [address] [phone]       Discharge Instructions   Call MD / Call 911    Complete by:  As directed   If you experience chest pain or shortness of breath, CALL 911 and be transported to the hospital emergency room.  If you develope a fever above 101 F, pus (white drainage) or increased drainage or redness at the wound, or calf pain, call your surgeon's office.     Change dressing    Complete by:  As directed   Maintain surgical dressing for 10-14 days, or until follow up in the clinic.     Constipation Prevention    Complete by:  As directed  Drink plenty of fluids.  Prune juice may be helpful.  You may use a stool softener, such as Colace (over the counter) 100 mg twice a day.  Use MiraLax (over the counter) for constipation as needed.     Diet - low sodium heart healthy    Complete by:  As directed      Discharge instructions    Complete by:  As directed   Maintain surgical dressing for 10-14 days, or until follow up in the clinic. Follow up in 2 weeks at [facility]. Call with any questions or concerns.     Increase activity slowly as tolerated    Complete by:  As directed      TED hose    Complete by:  As directed   Use stockings (TED hose) for 2 weeks on both leg(s).  You may remove them at night for sleeping.     Weight bearing as tolerated    Complete by:  As directed   Laterality:  left  Extremity:  Lower             Medication List    STOP taking these medications       oxyCODONE-acetaminophen 10-325 MG per tablet  Commonly known as:  PERCOCET      TAKE these medications       allopurinol 100 MG tablet  Commonly known as:  ZYLOPRIM  Take 100 mg by mouth daily with breakfast.     apixaban 5 MG Tabs tablet  Commonly known as:  ELIQUIS  Take 5 mg by mouth 2 (two) times daily.     baclofen 10 MG tablet  Commonly known as:  LIORESAL  Take 10 mg by mouth every 8  (eight) hours as needed for muscle spasms.     bismuth subsalicylate 262 MG/15ML suspension  Commonly known as:  PEPTO BISMOL  Take 15 mLs by mouth every 6 (six) hours as needed for indigestion or diarrhea or loose stools.     carvedilol 25 MG tablet  Commonly known as:  COREG  Take 12.5 mg by mouth 2 (two) times daily with a meal.     colchicine 0.6 MG tablet  Take 1.2 mg by mouth daily as needed (Gout).     DSS 100 MG Caps  Take 100 mg by mouth 2 (two) times daily.     ferrous sulfate 325 (65 FE) MG tablet  Take 1 tablet (325 mg total) by mouth 3 (three) times daily after meals.     FLECTOR 1.3 % Ptch  Generic drug:  diclofenac  Place 0.5 patches onto the skin daily as needed (Pain).     glipiZIDE 5 MG tablet  Commonly known as:  GLUCOTROL  Take 5 mg by mouth 2 (two) times daily.     GREEN TEA PO  Take 1 tablet by mouth daily.     hydrALAZINE 50 MG tablet  Commonly known as:  APRESOLINE  Take 50 mg by mouth 2 (two) times daily.     isosorbide mononitrate 60 MG 24 hr tablet  Commonly known as:  IMDUR  Take 60 mg by mouth every morning.     lisinopril 2.5 MG tablet  Commonly known as:  PRINIVIL,ZESTRIL  Take 1 tablet (2.5 mg total) by mouth 2 (two) times daily.     metolazone 5 MG tablet  Commonly known as:  ZAROXOLYN  Take 5 mg by mouth daily as needed (Swelling).     MUSCLE RUB 10-15 % Crea  Apply 1 application topically 3 (three) times daily as needed for muscle pain.     oxyCODONE 5 MG immediate release tablet  Commonly known as:  Oxy IR/ROXICODONE  Take 1-3 tablets (5-15 mg total) by mouth every 4 (four) hours.     pantoprazole 40 MG tablet  Commonly known as:  PROTONIX  Take 40 mg by mouth 2 (two) times daily.     polyethylene glycol packet  Commonly known as:  MIRALAX / GLYCOLAX  Take 17 g by mouth 2 (two) times daily.     potassium chloride SA 20 MEQ tablet  Commonly known as:  K-DUR,KLOR-CON  Take 1 tablet (20 mEq total) by mouth daily.      pregabalin 150 MG capsule  Commonly known as:  LYRICA  Take 150-300 mg by mouth 2 (two) times daily as needed (for pain). Take 150 mg by mouth in the morning and take 300 mg by mouth at night as needed for pain     spironolactone 25 MG tablet  Commonly known as:  ALDACTONE  Take 1 tablet (25 mg total) by mouth daily.     torsemide 20 MG tablet  Commonly known as:  DEMADEX  Take 20 mg by mouth 2 (two) times daily.     VITAMIN C PO  Take 2 tablets by mouth daily.         Signed: Anastasio Auerbach. Niralya Ohanian   PA-C  09/19/2014, 11:06 AM

## 2014-10-02 ENCOUNTER — Ambulatory Visit (HOSPITAL_COMMUNITY)
Admission: RE | Admit: 2014-10-02 | Discharge: 2014-10-02 | Disposition: A | Discharge status: Home / Self Care | Payer: Medicare HMO | Source: Ambulatory Visit | Attending: Internal Medicine | Admitting: Internal Medicine

## 2014-10-02 ENCOUNTER — Encounter (HOSPITAL_COMMUNITY): Payer: Self-pay

## 2014-10-02 ENCOUNTER — Other Ambulatory Visit: Payer: Self-pay | Admitting: Gastroenterology

## 2014-10-02 VITALS — BP 111/65 | HR 95 | Resp 18 | Wt 237.2 lb

## 2014-10-02 DIAGNOSIS — I428 Other cardiomyopathies: Secondary | ICD-10-CM

## 2014-10-02 DIAGNOSIS — I4891 Unspecified atrial fibrillation: Secondary | ICD-10-CM | POA: Insufficient documentation

## 2014-10-02 DIAGNOSIS — I429 Cardiomyopathy, unspecified: Secondary | ICD-10-CM

## 2014-10-02 DIAGNOSIS — I5022 Chronic systolic (congestive) heart failure: Secondary | ICD-10-CM

## 2014-10-02 DIAGNOSIS — N183 Chronic kidney disease, stage 3 (moderate): Secondary | ICD-10-CM | POA: Diagnosis not present

## 2014-10-02 DIAGNOSIS — M109 Gout, unspecified: Secondary | ICD-10-CM | POA: Diagnosis not present

## 2014-10-02 DIAGNOSIS — I480 Paroxysmal atrial fibrillation: Secondary | ICD-10-CM

## 2014-10-02 DIAGNOSIS — M25552 Pain in left hip: Secondary | ICD-10-CM | POA: Insufficient documentation

## 2014-10-02 MED ORDER — IVABRADINE HCL 5 MG PO TABS: 2.5000 mg | ORAL_TABLET | Freq: Two times a day (BID) | ORAL | Status: DC

## 2014-10-02 NOTE — Progress Notes (Addendum)
Patient ID: Frank Davidson, male   DOB: Apr 17, 1951, 63 y.o.   MRN: [id number]  PCP: Adline Mango FNP GI: Dr Yves Dill Nephrologist: Dr. Briant Cedar  HPI: Frank Davidson is a 63 yo male with a hx of obesity, PAF, HTN, COPD, HTN, gout, DM 2, CVA and CHF due to NICM.  Has previously followed in the [facility] in [place]. Previous cath in 2007 showed NICM with normal cors. Was living alone in [place] and apparently had issues managing medications by himself and was getting confused with PCP and cardiologists medications and was not taking his meds correctly. He moved to [place] to be near his nieces. Issues in the past with ACE-I due to renal failure. Highest creatinine in our system is ~1.5 but was apparently higher previously - thought to be related to taking his medications incorrectly.   Was admitted in 06/2013 from [facility] with left sided weakness and was found to have a moderate to large right hemispheric infarct. Went back to [facility].   Admitted to [facility] 10/14 through 09/09/13 with ADHF. Diuresed 30 pounds with IV lasix and transitioned to lasix 80 mg po bid. Continued on 25 mg carvedilol bid, isordil 20 mg daily.  Started on lisinopril 2.5 mg daily. Discharge weight 229 pounds. 09/29/13 CT abdomen and pelvis - no obstruction . Diverticulosis. No inflammatory changes.   Follow up for Heart Failure: Since last visit underwent L hip surgery which went well. Overall doing good. Denies SOB, orthopnea, CP or PND. Weight at home 237 lbs. Following a low salt diet and drinking less than 2L a dya. Taking all medications. Pain 5/10 which is improved. Walking with walker/cane daily with some pain. Able to walk about 1/4 mile.   ECHO 11/08/13 EF 15%  ECHO 07/05/14: EF 15%   08/13/14 RA = 7  RV = 50/3/10  PA = 54/22 (34)  PCW = 16  Fick cardiac output/index = 6.9/3.0  Thermo CO/CI = 4.7/2.1  PVR = 4.1 WU  Ao sat = 94%  PA sat = 68%,68%  SVC sat (sheath) = 62%   Labs 10/01/13 Dig  level 2.5 --->dig stopped Labs 10/05/13 K 4.8 Creatinine 1.79 Labs 11/01/13 K 3.3 --Supplemented Creatinine 1.3 Uric Acid 9.3 AST 23 ALT 12 Hemoglobin 13 Hemoglobin 7.2  Labs 11/21/13 K 4.4 Creatinine 1.3            01/02/14 K 4.2, Creatinine 1.1            01/10/14 K 4.6, Creatinine 1.28           04/22/14 K 4.0 Creatinine 1.58         05/13/14 K 4.3, creatinine 1.46, pro-BNP 1377          06/24/14: K 3.9, creatinine 1.49         07/02/14 K 4.5, creatinine 1.82, pro-BNP 696.7         07/16/14 K 3.7, creatinine 2.41         08/01/14 K 4.3 creatinine 2.6 (demadex held for 2 days) - then restarted at 40 bid (baseline dose)         08/22/14 K 4.4 creatinine 1.6         09/02/14 Creatinine 1.6   SH: Retired Engineer, 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  Past Medical History  Diagnosis Date  . Atrial fibrillation   . Hypertension   .  COPD (chronic obstructive pulmonary disease)   . Coronary artery disease   . Ocular herpes   . Gout   . CHF (congestive heart failure)     EF 15%  . Chronic pain   . Hyperlipidemia   . Nonischemic cardiomyopathy   . Ocular herpes   . Neuropathy   . Left-sided weakness     "because of the arthritis and edema"  . Acute on chronic systolic heart failure   . Decreased pedal pulses   . Memory impairment     "short term"  . CKD (chronic kidney disease), stage III     Dr. Briant CedarMattingly follows-holding   . Fibromyalgia     neuropathy- hands, more than feet.  . Sleep apnea with use of continuous positive airway pressure (CPAP)     does not use cpap  . GERD (gastroesophageal reflux disease)     tums as needed  . Asthma   . Type II diabetes mellitus   . Stroke X 5    last stroke aug 2014"; denies residual on 09/12/2014  . Osteoarthritis   . Arthritis     "knees, right elbow, shoulder" (09/12/2014)    Current Outpatient Prescriptions  Medication Sig  Dispense Refill  . allopurinol (ZYLOPRIM) 100 MG tablet Take 100 mg by mouth daily with breakfast.    . apixaban (ELIQUIS) 5 MG TABS tablet Take 5 mg by mouth 2 (two) times daily.    . Ascorbic Acid (VITAMIN C PO) Take 2 tablets by mouth daily.     . baclofen (LIORESAL) 10 MG tablet Take 10 mg by mouth every 8 (eight) hours as needed for muscle spasms.    Marland Kitchen. bismuth subsalicylate (PEPTO BISMOL) 262 MG/15ML suspension Take 15 mLs by mouth every 6 (six) hours as needed for indigestion or diarrhea or loose stools.    . carvedilol (COREG) 25 MG tablet Take 12.5 mg by mouth 2 (two) times daily with a meal.     . colchicine 0.6 MG tablet Take 1.2 mg by mouth daily as needed (Gout).    Marland Kitchen. docusate sodium 100 MG CAPS Take 100 mg by mouth 2 (two) times daily. 10 capsule 0  . ferrous sulfate 325 (65 FE) MG tablet Take 1 tablet (325 mg total) by mouth 3 (three) times daily after meals.  3  . FLECTOR 1.3 % PTCH Place 0.5 patches onto the skin daily as needed (Pain).     Marland Kitchen. glipiZIDE (GLUCOTROL) 5 MG tablet Take 5 mg by mouth 2 (two) times daily.    Chilton Si. Green Tea, Camillia sinensis, (GREEN TEA PO) Take 1 tablet by mouth daily.    . hydrALAZINE (APRESOLINE) 50 MG tablet Take 50 mg by mouth 2 (two) times daily.    . isosorbide mononitrate (IMDUR) 60 MG 24 hr tablet Take 60 mg by mouth every morning.    Marland Kitchen. lisinopril (PRINIVIL,ZESTRIL) 2.5 MG tablet Take 1 tablet (2.5 mg total) by mouth 2 (two) times daily. 60 tablet 3  . Menthol-Methyl Salicylate (MUSCLE RUB) 10-15 % CREA Apply 1 application topically 3 (three) times daily as needed for muscle pain.    . metolazone (ZAROXOLYN) 5 MG tablet Take 5 mg by mouth daily as needed (Swelling).     Marland Kitchen. oxyCODONE (OXY IR/ROXICODONE) 5 MG immediate release tablet Take 1-3 tablets (5-15 mg total) by mouth every 4 (four) hours. 80 tablet 0  . pantoprazole (PROTONIX) 40 MG tablet Take 40 mg by mouth 2 (two) times daily.    . pantoprazole (  PROTONIX) 40 MG tablet TAKE 1 TABLET (40 MG  TOTAL) BY MOUTH 2 (TWO) TIMES DAILY. 60 tablet 2  . polyethylene glycol (MIRALAX / GLYCOLAX) packet Take 17 g by mouth 2 (two) times daily. 14 each 0  . potassium chloride SA (K-DUR,KLOR-CON) 20 MEQ tablet Take 1 tablet (20 mEq total) by mouth daily. 30 tablet 6  . pregabalin (LYRICA) 150 MG capsule Take 150-300 mg by mouth 2 (two) times daily as needed (for pain). Take 150 mg by mouth in the morning and take 300 mg by mouth at night as needed for pain    . spironolactone (ALDACTONE) 25 MG tablet Take 1 tablet (25 mg total) by mouth daily. 30 tablet 3  . torsemide (DEMADEX) 20 MG tablet Take 20 mg by mouth 2 (two) times daily.     No current facility-administered medications for this encounter.    Filed Vitals:   10/02/14 1006  BP: 111/65  Pulse: 95  Resp: 18  Weight: 237 lb 4 oz (107.616 kg)  SpO2: 98%   PHYSICAL EXAM: General:  Obese male;  Ambulated into clinic with a walker,  HEENT: normal Neck: supple. JVP difficult to assess d/t body habitus but appears mildly elevated.  Carotids 2+ bilaterally; no bruits. No lymphadenopathy or thryomegaly appreciated. Cor: PMI normal. Regular rate & rhythm. No rubs, gallops or murmurs. Lungs: clear Abdomen: obese soft, nontender, mildly distended. No hepatosplenomegaly. No bruits or masses. Good bowel sounds. Extremities: no cyanosis, clubbing, rash, tr bilateral  edema.  Neuro: alert & orientedx3, cranial nerves grossly intact. Affect pleasant.  ASSESSMENT & PLAN:  1) Chronic Systolic Heart Failure: NICM, EF 5-10% (06/2014) - NYHA III symptoms and volume status stable. Will continue torsemide 20 mg BID.  - Continue coreg 12.5 mg BID. Has not tolerated titration in the past d/t fatigue. - Continue lisinopril 2.5 mg BID, spiro 25 mg daily and hydralazine 50 mg TID and Imdur 60 mg daily.   - Start corlanor 2.5 mg BID.  - He is not interested in an ICD which could help with looking at thoracic impedence and volume as well as protect him from  SCD.  - Reinforced the need and importance of daily weights, a low sodium diet and to avoid soups, and fluid restriction (less than 2 L a day). Instructed to call the HF clinic if weight increases more than 3 lbs overnight or 5 lbs in a week.  2) Gout - Continue allopurinol. Continue to follow with rheumatology. Is now going to Pain management clinic for pain.  3) Atrial fibrillation-  Appears to be in NSR today. Continue BB and Eliquis. No s/s of bleeding.  4) CKD stage III - Baseline 1.9-2.4. Check BMET today with Guide-It 5) L Hip pain: - much improved with L hip surgery.   F/U 2 weeks.  Aundria Rud, NP-C 10:34 AM

## 2014-10-02 NOTE — Patient Instructions (Signed)
Doing well.  Start corlanor 2.5 mg (1/2 tablet) twice a day. Call if any issues with price.  Follow up in 2 weeks.  Do the following things EVERYDAY: 1) Weigh yourself in the morning before breakfast. Write it down and keep it in a log. 2) Take your medicines as prescribed 3) Eat low salt foods-Limit salt (sodium) to 2000 mg per day.  4) Stay as active as you can everyday 5) Limit all fluids for the day to less than 2 liters 6)

## 2014-10-03 ENCOUNTER — Encounter: Payer: Self-pay | Admitting: Internal Medicine

## 2014-10-16 ENCOUNTER — Encounter (HOSPITAL_COMMUNITY): Payer: Self-pay

## 2014-10-16 ENCOUNTER — Telehealth (HOSPITAL_COMMUNITY): Payer: Self-pay

## 2014-10-16 ENCOUNTER — Ambulatory Visit (HOSPITAL_COMMUNITY)
Admission: RE | Admit: 2014-10-16 | Discharge: 2014-10-16 | Disposition: A | Discharge status: Home / Self Care | Payer: Medicare HMO | Source: Ambulatory Visit | Attending: Cardiology | Admitting: Cardiology

## 2014-10-16 VITALS — BP 96/50 | HR 60 | Resp 18 | Wt 242.0 lb

## 2014-10-16 DIAGNOSIS — I4891 Unspecified atrial fibrillation: Secondary | ICD-10-CM | POA: Insufficient documentation

## 2014-10-16 DIAGNOSIS — I5022 Chronic systolic (congestive) heart failure: Secondary | ICD-10-CM

## 2014-10-16 DIAGNOSIS — M109 Gout, unspecified: Secondary | ICD-10-CM | POA: Diagnosis not present

## 2014-10-16 DIAGNOSIS — M25552 Pain in left hip: Secondary | ICD-10-CM | POA: Diagnosis not present

## 2014-10-16 DIAGNOSIS — N183 Chronic kidney disease, stage 3 unspecified: Secondary | ICD-10-CM

## 2014-10-16 DIAGNOSIS — I480 Paroxysmal atrial fibrillation: Secondary | ICD-10-CM

## 2014-10-16 DIAGNOSIS — M1000 Idiopathic gout, unspecified site: Secondary | ICD-10-CM

## 2014-10-16 LAB — BASIC METABOLIC PANEL
ANION GAP: 14 (ref 5–15)
BUN: 56 mg/dL — ABNORMAL HIGH (ref 6–23)
CO2: 22 mEq/L (ref 19–32)
Calcium: 9.3 mg/dL (ref 8.4–10.5)
Chloride: 97 mEq/L (ref 96–112)
Creatinine, Ser: 3.12 mg/dL — ABNORMAL HIGH (ref 0.50–1.35)
GFR calc Af Amer: 23 mL/min — ABNORMAL LOW (ref >90)
GFR, EST NON AFRICAN AMERICAN: 20 mL/min — AB (ref >90)
GLUCOSE: 80 mg/dL (ref 70–99)
POTASSIUM: 4.6 meq/L (ref 3.7–5.3)
Sodium: 133 mEq/L — ABNORMAL LOW (ref 137–147)

## 2014-10-16 MED ORDER — SACUBITRIL-VALSARTAN 24-26 MG PO TABS: 1.0000 | ORAL_TABLET | Freq: Two times a day (BID) | ORAL | Status: DC

## 2014-10-16 NOTE — Telephone Encounter (Signed)
BMET from today's clinic visit elevated.  Per Ulla Potash NP-C, instructed patient to hold torsemide for the next 3 days, and only take PRN metolazone after he has called our office for verification to take it first.  Aware and agreeable.  Ave Filter

## 2014-10-16 NOTE — Progress Notes (Signed)
Patient ID: Frank Davidson, male   DOB: [birth year], 63 y.o.   MRN: [id number]  PCP: Adline Mango FNP GI: Dr Yves Dill Nephrologist: Dr. Briant Cedar  HPI: Frank Davidson is a 63 yo male with a hx of obesity, PAF, HTN, COPD, HTN, gout, DM 2, CVA and CHF due to NICM.  Has previously followed in the [facility] in [place]. Previous cath in 2007 showed NICM with normal cors. Was living alone in [place] and apparently had issues managing medications by himself and was getting confused with PCP and cardiologists medications and was not taking his meds correctly. He moved to [place] to be near his nieces. Issues in the past with ACE-I due to renal failure. Highest creatinine in our system is ~1.5 but was apparently higher previously - thought to be related to taking his medications incorrectly.   Was admitted in 06/2013 from [facility] with left sided weakness and was found to have a moderate to large right hemispheric infarct. Went back to [employer] [facility].   Admitted to [facility] 10/14 through 09/09/13 with ADHF. Diuresed 30 pounds with IV lasix and transitioned to lasix 80 mg po bid. Continued on 25 mg carvedilol bid, isordil 20 mg daily.  Started on lisinopril 2.5 mg daily. Discharge weight 229 pounds. 09/29/13 CT abdomen and pelvis - no obstruction . Diverticulosis. No inflammatory changes.   Follow up for Heart Failure: Last visit was going to start Corlanor 2.5 mg BID however he never got the medication and how HR is low. Feels great. Denies SOB, PND, orthopnea or CP. + wheezing. Weight at home 238 lbs. Hip pain gone. Walking with walker and sometimes without. He has finished PT. Able to walk 2 miles with no issues. Following a low salt diet and drinking less than 2L a day.   ECHO 11/08/13 EF 15%  ECHO 07/05/14: EF 15%   08/13/14 RA = 7  RV = 50/3/10  PA = 54/22 (34)  PCW = 16  Fick cardiac output/index = 6.9/3.0  Thermo CO/CI = 4.7/2.1  PVR = 4.1 WU  Ao sat = 94%  PA sat = 68%,68%  SVC sat  (sheath) = 62%   Labs 10/01/13 Dig level 2.5 --->dig stopped Labs 10/05/13 K 4.8 Creatinine 1.79 Labs 11/01/13 K 3.3 --Supplemented Creatinine 1.3 Uric Acid 9.3 AST 23 ALT 12 Hemoglobin 13 Hemoglobin 7.2  Labs 11/21/13 K 4.4 Creatinine 1.3            01/02/14 K 4.2, Creatinine 1.1            01/10/14 K 4.6, Creatinine 1.28           04/22/14 K 4.0 Creatinine 1.58         05/13/14 K 4.3, creatinine 1.46, pro-BNP 1377          06/24/14: K 3.9, creatinine 1.49         07/02/14 K 4.5, creatinine 1.82, pro-BNP 696.7         07/16/14 K 3.7, creatinine 2.41         08/01/14 K 4.3 creatinine 2.6 (demadex held for 2 days) - then restarted at 40 bid (baseline dose)         08/22/14 K 4.4 creatinine 1.6         09/02/14 Creatinine 1.6   SH: Retired Engineer, 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  Past Medical  History  Diagnosis Date  . Atrial fibrillation   . Hypertension   . COPD (chronic obstructive pulmonary disease)   . Coronary artery disease   . Ocular herpes   . Gout   . CHF (congestive heart failure)     EF 15%  . Chronic pain   . Hyperlipidemia   . Nonischemic cardiomyopathy   . Ocular herpes   . Neuropathy   . Left-sided weakness     "because of the arthritis and edema"  . Acute on chronic systolic heart failure   . Decreased pedal pulses   . Memory impairment     "short term"  . CKD (chronic kidney disease), stage III     Dr. Briant CedarMattingly follows-holding   . Fibromyalgia     neuropathy- hands, more than feet.  . Sleep apnea with use of continuous positive airway pressure (CPAP)     does not use cpap  . GERD (gastroesophageal reflux disease)     tums as needed  . Asthma   . Type II diabetes mellitus   . Stroke X 5    last stroke aug 2014"; denies residual on 09/12/2014  . Osteoarthritis   . Arthritis     "knees, right elbow, shoulder" (09/12/2014)    Current  Outpatient Prescriptions  Medication Sig Dispense Refill  . allopurinol (ZYLOPRIM) 100 MG tablet Take 100 mg by mouth daily with breakfast.    . apixaban (ELIQUIS) 5 MG TABS tablet Take 5 mg by mouth 2 (two) times daily.    . Ascorbic Acid (VITAMIN C PO) Take 2 tablets by mouth daily.     . baclofen (LIORESAL) 10 MG tablet Take 10 mg by mouth every 8 (eight) hours as needed for muscle spasms.    Marland Kitchen. bismuth subsalicylate (PEPTO BISMOL) 262 MG/15ML suspension Take 15 mLs by mouth every 6 (six) hours as needed for indigestion or diarrhea or loose stools.    . carvedilol (COREG) 25 MG tablet Take 12.5 mg by mouth 2 (two) times daily with a meal.     . colchicine 0.6 MG tablet Take 1.2 mg by mouth daily as needed (Gout).    Marland Kitchen. docusate sodium 100 MG CAPS Take 100 mg by mouth 2 (two) times daily. 10 capsule 0  . ferrous sulfate 325 (65 FE) MG tablet Take 1 tablet (325 mg total) by mouth 3 (three) times daily after meals.  3  . FLECTOR 1.3 % PTCH Place 0.5 patches onto the skin daily as needed (Pain).     Marland Kitchen. glipiZIDE (GLUCOTROL) 5 MG tablet Take 5 mg by mouth 2 (two) times daily.    Chilton Si. Green Tea, Camillia sinensis, (GREEN TEA PO) Take 1 tablet by mouth daily.    . hydrALAZINE (APRESOLINE) 50 MG tablet Take 50 mg by mouth 2 (two) times daily.    . isosorbide mononitrate (IMDUR) 60 MG 24 hr tablet Take 60 mg by mouth every morning.    . ivabradine HCl (CORLANOR) 5 MG TABS tablet Take 0.5 tablets (2.5 mg total) by mouth 2 (two) times daily with a meal. 30 tablet 3  . lisinopril (PRINIVIL,ZESTRIL) 2.5 MG tablet Take 1 tablet (2.5 mg total) by mouth 2 (two) times daily. 60 tablet 3  . Menthol-Methyl Salicylate (MUSCLE RUB) 10-15 % CREA Apply 1 application topically 3 (three) times daily as needed for muscle pain.    . metolazone (ZAROXOLYN) 5 MG tablet Take 5 mg by mouth daily as needed (Swelling).     Marland Kitchen. oxyCODONE (OXY  IR/ROXICODONE) 5 MG immediate release tablet Take 1-3 tablets (5-15 mg total) by mouth every  4 (four) hours. 80 tablet 0  . pantoprazole (PROTONIX) 40 MG tablet Take 40 mg by mouth 2 (two) times daily.    . pantoprazole (PROTONIX) 40 MG tablet TAKE 1 TABLET (40 MG TOTAL) BY MOUTH 2 (TWO) TIMES DAILY. 60 tablet 2  . polyethylene glycol (MIRALAX / GLYCOLAX) packet Take 17 g by mouth 2 (two) times daily. 14 each 0  . potassium chloride SA (K-DUR,KLOR-CON) 20 MEQ tablet Take 1 tablet (20 mEq total) by mouth daily. 30 tablet 6  . pregabalin (LYRICA) 150 MG capsule Take 150-300 mg by mouth 2 (two) times daily as needed (for pain). Take 150 mg by mouth in the morning and take 300 mg by mouth at night as needed for pain    . spironolactone (ALDACTONE) 25 MG tablet Take 1 tablet (25 mg total) by mouth daily. 30 tablet 3  . torsemide (DEMADEX) 20 MG tablet Take 20 mg by mouth 2 (two) times daily.     No current facility-administered medications for this encounter.    Filed Vitals:   10/16/14 1034  BP: 96/50  Pulse: 60  Resp: 18  Weight: 242 lb (109.77 kg)  SpO2: 98%   PHYSICAL EXAM: General:  Obese male;  Ambulated into clinic with a walker,  HEENT: normal Neck: supple. JVP difficult to assess d/t body habitus but appears flat;  Carotids 2+ bilaterally; no bruits. No lymphadenopathy or thryomegaly appreciated. Cor: PMI normal. Regular rate & rhythm. No rubs, gallops or murmurs. Lungs: clear Abdomen: obese soft, nontender, mildly distended. No hepatosplenomegaly. No bruits or masses. Good bowel sounds. Extremities: no cyanosis, clubbing, rash, tr bilateral  edema.  Neuro: alert & orientedx3, cranial nerves grossly intact. Affect pleasant.  EKG: SR 60 with 1st degree AV block with PVCs  ASSESSMENT & PLAN:  1) Chronic Systolic Heart Failure: NICM, EF 5-10% (06/2014) - NYHA II symptoms and volume status stable. Will continue torsemide 20 mg BID.  - Continue coreg 12.5 mg BID. Has not tolerated titration in the past d/t fatigue. - Will stop lisinopril for 48 hrs and start Entresto 24-26  BID, provided 14 day samples. Check BMET today and in 7-10 days. Call any dizziness.  - Continue current dose of hydralazine, Imdur and spironolactone.  - Did not start Corlanor last visit because was not approved yet. Medication has since been approved however HR this am 60 which is unusal for patient. He is no longer in pain d/t having hip surgery and maybe pain was contributing to increased HR.  - He is not interested in an ICD which could help with looking at thoracic impedence and volume as well as protect him from SCD.  - Reinforced the need and importance of daily weights, a low sodium diet and to avoid soups, and fluid restriction (less than 2 L a day). Instructed to call the HF clinic if weight increases more than 3 lbs overnight or 5 lbs in a week.  2) Gout - Continue allopurinol. Continue to follow with rheumatology. Is now going to Pain management clinic for pain.  3) Atrial fibrillation-  Appears to be in NSR today. Continue BB and Eliquis. No s/s of bleeding.  4) CKD stage III - Baseline 1.9-2.4. Check BMET today with Guide-It 5) L Hip pain: - much improved with L hip surgery.   F/U 1 month, 2 weeks with labs.  Aundria Rud, NP-C 10:47 AM

## 2014-10-16 NOTE — Patient Instructions (Signed)
Stop lisinopril.  On Friday start Entresto 24-26 (1 tablet) twice a day. Call any dizziness.  Follow up in 2 weeks for Blood pressure check and BMET, have them call Karie Mainland with Blood pressure.  Have a wonderful Thanksgiving.  Follow up in 1 month  Do the following things EVERYDAY: 1) Weigh yourself in the morning before breakfast. Write it down and keep it in a log. 2) Take your medicines as prescribed 3) Eat low salt foods-Limit salt (sodium) to 2000 mg per day.  4) Stay as active as you can everyday 5) Limit all fluids for the day to less than 2 liters 6)

## 2014-10-18 ENCOUNTER — Other Ambulatory Visit (HOSPITAL_COMMUNITY): Payer: Self-pay | Admitting: Cardiology

## 2014-10-19 ENCOUNTER — Emergency Department (HOSPITAL_COMMUNITY): Payer: Medicare HMO

## 2014-10-19 ENCOUNTER — Inpatient Hospital Stay (HOSPITAL_COMMUNITY)
Admission: EM | Admit: 2014-10-19 | Discharge: 2014-10-20 | DRG: 312 | Disposition: A | Discharge status: Home / Self Care | Payer: Medicare HMO | Attending: Family Medicine | Admitting: Family Medicine

## 2014-10-19 ENCOUNTER — Encounter (HOSPITAL_COMMUNITY): Payer: Self-pay | Admitting: *Deleted

## 2014-10-19 DIAGNOSIS — M109 Gout, unspecified: Secondary | ICD-10-CM | POA: Diagnosis present

## 2014-10-19 DIAGNOSIS — G629 Polyneuropathy, unspecified: Secondary | ICD-10-CM | POA: Diagnosis present

## 2014-10-19 DIAGNOSIS — W19XXXD Unspecified fall, subsequent encounter: Secondary | ICD-10-CM

## 2014-10-19 DIAGNOSIS — N183 Chronic kidney disease, stage 3 unspecified: Secondary | ICD-10-CM | POA: Insufficient documentation

## 2014-10-19 DIAGNOSIS — E119 Type 2 diabetes mellitus without complications: Secondary | ICD-10-CM | POA: Diagnosis present

## 2014-10-19 DIAGNOSIS — I129 Hypertensive chronic kidney disease with stage 1 through stage 4 chronic kidney disease, or unspecified chronic kidney disease: Secondary | ICD-10-CM | POA: Diagnosis present

## 2014-10-19 DIAGNOSIS — E785 Hyperlipidemia, unspecified: Secondary | ICD-10-CM | POA: Diagnosis present

## 2014-10-19 DIAGNOSIS — M25562 Pain in left knee: Secondary | ICD-10-CM

## 2014-10-19 DIAGNOSIS — Z7901 Long term (current) use of anticoagulants: Secondary | ICD-10-CM

## 2014-10-19 DIAGNOSIS — I5022 Chronic systolic (congestive) heart failure: Secondary | ICD-10-CM

## 2014-10-19 DIAGNOSIS — I429 Cardiomyopathy, unspecified: Secondary | ICD-10-CM | POA: Diagnosis present

## 2014-10-19 DIAGNOSIS — Z8673 Personal history of transient ischemic attack (TIA), and cerebral infarction without residual deficits: Secondary | ICD-10-CM

## 2014-10-19 DIAGNOSIS — R55 Syncope and collapse: Secondary | ICD-10-CM | POA: Diagnosis present

## 2014-10-19 DIAGNOSIS — J449 Chronic obstructive pulmonary disease, unspecified: Secondary | ICD-10-CM | POA: Diagnosis present

## 2014-10-19 DIAGNOSIS — I2510 Atherosclerotic heart disease of native coronary artery without angina pectoris: Secondary | ICD-10-CM | POA: Diagnosis present

## 2014-10-19 DIAGNOSIS — W19XXXA Unspecified fall, initial encounter: Secondary | ICD-10-CM

## 2014-10-19 DIAGNOSIS — I4891 Unspecified atrial fibrillation: Secondary | ICD-10-CM | POA: Diagnosis present

## 2014-10-19 DIAGNOSIS — Z96642 Presence of left artificial hip joint: Secondary | ICD-10-CM | POA: Diagnosis present

## 2014-10-19 DIAGNOSIS — Z79899 Other long term (current) drug therapy: Secondary | ICD-10-CM | POA: Diagnosis not present

## 2014-10-19 DIAGNOSIS — E875 Hyperkalemia: Secondary | ICD-10-CM | POA: Diagnosis present

## 2014-10-19 DIAGNOSIS — N179 Acute kidney failure, unspecified: Secondary | ICD-10-CM

## 2014-10-19 DIAGNOSIS — Z87891 Personal history of nicotine dependence: Secondary | ICD-10-CM

## 2014-10-19 DIAGNOSIS — R0600 Dyspnea, unspecified: Secondary | ICD-10-CM

## 2014-10-19 LAB — COMPREHENSIVE METABOLIC PANEL
ALBUMIN: 3.8 g/dL (ref 3.5–5.2)
ALK PHOS: 118 U/L — AB (ref 39–117)
ALT: 10 U/L (ref 0–53)
AST: 22 U/L (ref 0–37)
Anion gap: 15 (ref 5–15)
BILIRUBIN TOTAL: 0.3 mg/dL (ref 0.3–1.2)
BUN: 69 mg/dL — ABNORMAL HIGH (ref 6–23)
CHLORIDE: 102 meq/L (ref 96–112)
CO2: 19 mEq/L (ref 19–32)
CREATININE: 3.24 mg/dL — AB (ref 0.50–1.35)
Calcium: 9 mg/dL (ref 8.4–10.5)
GFR calc Af Amer: 22 mL/min — ABNORMAL LOW (ref >90)
GFR, EST NON AFRICAN AMERICAN: 19 mL/min — AB (ref >90)
Glucose, Bld: 74 mg/dL (ref 70–99)
Potassium: 5.4 mEq/L — ABNORMAL HIGH (ref 3.7–5.3)
SODIUM: 136 meq/L — AB (ref 137–147)
Total Protein: 7.6 g/dL (ref 6.0–8.3)

## 2014-10-19 LAB — PRO B NATRIURETIC PEPTIDE: Pro B Natriuretic peptide (BNP): 1637 pg/mL — ABNORMAL HIGH (ref 0–125)

## 2014-10-19 LAB — CBC
HCT: 39.2 % (ref 39.0–52.0)
Hemoglobin: 12.2 g/dL — ABNORMAL LOW (ref 13.0–17.0)
MCH: 27.1 pg (ref 26.0–34.0)
MCHC: 31.1 g/dL (ref 30.0–36.0)
MCV: 86.9 fL (ref 78.0–100.0)
Platelets: 164 10*3/uL (ref 150–400)
RBC: 4.51 MIL/uL (ref 4.22–5.81)
RDW: 14.8 % (ref 11.5–15.5)
WBC: 4.4 10*3/uL (ref 4.0–10.5)

## 2014-10-19 LAB — TROPONIN I: Troponin I: <0.3 ng/mL (ref <0.30)

## 2014-10-19 LAB — PROTIME-INR
INR: 1.05 (ref 0.00–1.49)
Prothrombin Time: 13.9 seconds (ref 11.6–15.2)

## 2014-10-19 NOTE — ED Provider Notes (Signed)
CSN: [id number]     Arrival date & time 10/19/14  1810 History   First MD Initiated Contact with Patient 10/19/14 2051     Chief Complaint  Patient presents with  . Altered Mental Status     (Consider location/radiation/quality/duration/timing/severity/associated sxs/prior Treatment) Patient is a 63 y.o. male presenting with syncope.  Loss of Consciousness Episode history:  Multiple Most recent episode:  2 days ago Timing:  Intermittent Progression:  Worsening Chronicity:  Recurrent Context: normal activity, sitting down and standing up   Witnessed: yes   Relieved by:  Nothing Worsened by:  Nothing tried Ineffective treatments:  None tried Associated symptoms: recent fall   Associated symptoms: no anxiety and no focal weakness     Past Medical History  Diagnosis Date  . Atrial fibrillation   . Hypertension   . COPD (chronic obstructive pulmonary disease)   . Coronary artery disease   . Ocular herpes   . Gout   . CHF (congestive heart failure)     EF 15%  . Chronic pain   . Hyperlipidemia   . Nonischemic cardiomyopathy   . Ocular herpes   . Neuropathy   . Left-sided weakness     "because of the arthritis and edema"  . Acute on chronic systolic heart failure   . Decreased pedal pulses   . Memory impairment     "short term"  . CKD (chronic kidney disease), stage III     Dr. Briant Cedar follows-holding   . Fibromyalgia     neuropathy- hands, more than feet.  . Sleep apnea with use of continuous positive airway pressure (CPAP)     does not use cpap  . GERD (gastroesophageal reflux disease)     tums as needed  . Asthma   . Type II diabetes mellitus   . Stroke X 5    last stroke aug 2014"; denies residual on 09/12/2014  . Osteoarthritis   . Arthritis     "knees, right elbow, shoulder" (09/12/2014)   Past Surgical History  Procedure Laterality Date  . 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 Tonsillectomy  age 66  . Esophagogastroduodenoscopy N/A 12/24/2013     Procedure: ESOPHAGOGASTRODUODENOSCOPY (EGD);  Surgeon: Louis Meckel, MD;  Location: [facility];  Service: Endoscopy;  Laterality: N/A;  . Colonoscopy N/A 12/24/2013    Procedure: COLONOSCOPY;  Surgeon: Louis Meckel, MD;  Location: [facility];  Service: Endoscopy;  Laterality: N/A;  . Total hip arthroplasty Left 09/12/2014  . Joint replacement    . Cardiac catheterization  10/2006; 08/2014    normal coronary arteries;   Marland Kitchen Cataract extraction w/ intraocular lens  implant, bilateral Bilateral ~ 2008  . Total hip arthroplasty Left 09/12/2014    Procedure: LEFT TOTAL HIP ARTHROPLASTY ANTERIOR APPROACH;  Surgeon: [name], MD;  Location: [facility] OR;  Service: Orthopedics;  Laterality: Left;   Family History  Problem Relation Age of Onset  . Cancer Mother   . Hypertension Mother   . Lung disease Mother   . Diabetes Father   . Diabetes Sister   . Hypertension Sister   . Hypertension Brother   . Hypertension Sister   . Colon cancer Neg Hx    History  Substance Use Topics  . Smoking status: Former Smoker -- 0.50 packs/day for 10 years    Types: Cigarettes    Quit date: 12/18/2006  . Smokeless tobacco: Never Used  . Alcohol Use: Yes     Comment: Quit  2008    Review of Systems  Cardiovascular: Positive for syncope.  Neurological: Negative for focal weakness.  All other systems reviewed and are negative.     Allergies  Actos and Januvia  Home Medications   Prior to Admission medications   Medication Sig Start Date End Date Taking? Authorizing Provider  allopurinol (ZYLOPRIM) 100 MG tablet Take 100 mg by mouth daily with breakfast. 11/01/13  Yes Baker Pierini, FNP  apixaban (ELIQUIS) 5 MG TABS tablet Take 5 mg by mouth 2 (two) times daily. 11/01/13  Yes Baker Pierini, FNP  Ascorbic Acid (VITAMIN C PO) Take 2 tablets by mouth daily.    Yes Historical Provider, MD  baclofen (LIORESAL) 10 MG tablet Take 10 mg by mouth every 8 (eight) hours as needed for muscle  spasms.   Yes Historical Provider, MD  bismuth subsalicylate (PEPTO BISMOL) 262 MG/15ML suspension Take 15 mLs by mouth every 6 (six) hours as needed for indigestion or diarrhea or loose stools.   Yes Historical Provider, MD  carvedilol (COREG) 25 MG tablet Take 12.5 mg by mouth 2 (two) times daily with a meal.    Yes Historical Provider, MD  colchicine 0.6 MG tablet Take 1.2 mg by mouth daily as needed (Gout).   Yes Historical Provider, MD  docusate sodium 100 MG CAPS Take 100 mg by mouth 2 (two) times daily. 09/16/14  Yes Genelle Gather Babish, PA-C  FLECTOR 1.3 % PTCH Place 0.5 patches onto the skin daily as needed (Pain).  03/21/14  Yes Historical Provider, MD  glipiZIDE (GLUCOTROL) 5 MG tablet Take 5 mg by mouth 2 (two) times daily.   Yes Historical Provider, MD  Chilton Si Tea, Camillia sinensis, (GREEN TEA PO) Take 1 tablet by mouth daily.   Yes Historical Provider, MD  hydrALAZINE (APRESOLINE) 50 MG tablet Take 50 mg by mouth 2 (two) times daily.   Yes Historical Provider, MD  isosorbide mononitrate (IMDUR) 60 MG 24 hr tablet Take 60 mg by mouth every morning.   Yes Historical Provider, MD  Menthol-Methyl Salicylate (MUSCLE RUB) 10-15 % CREA Apply 1 application topically 3 (three) times daily as needed for muscle pain.   Yes Historical Provider, MD  metolazone (ZAROXOLYN) 5 MG tablet Take 5 mg by mouth daily as needed (Swelling).  05/13/14  Yes Amy D Clegg, NP  oxyCODONE (OXY IR/ROXICODONE) 5 MG immediate release tablet Take 1-3 tablets (5-15 mg total) by mouth every 4 (four) hours. 09/16/14  Yes [name], MD  polyethylene glycol [facility] Ethelene Hal) packet Take 17 g by mouth 2 (two) times daily. 09/16/14  Yes Genelle Gather Babish, PA-C  potassium chloride SA (K-DUR,KLOR-CON) 20 MEQ tablet Take 1 tablet (20 mEq total) by mouth daily. 04/22/14  Yes Gwenlyn Found Copland, MD  pregabalin (LYRICA) 75 MG capsule Take 75 mg by mouth daily.   Yes Historical Provider, MD  spironolactone (ALDACTONE) 25 MG  tablet Take 1 tablet (25 mg total) by mouth daily. 05/21/14  Yes Aundria Rud, NP  ferrous sulfate 325 (65 FE) MG tablet Take 1 tablet (325 mg total) by mouth 3 (three) times daily after meals. 09/16/14   Genelle Gather Babish, PA-C   BP 100/50 mmHg  Pulse 67  Temp(Src) 98.1 F (36.7 C) (Oral)  Resp 16  Ht 5\' 10"  (1.778 m)  Wt 237 lb 12.8 oz (107.865 kg)  BMI 34.12 kg/m2  SpO2 100% Physical Exam  Constitutional: He is oriented to person, place, and time. He appears well-developed and  well-nourished.  HENT:  Head: Normocephalic and atraumatic.  Eyes: Conjunctivae and EOM are normal.  Neck: Normal range of motion. Neck supple.  Cardiovascular: Normal rate, regular rhythm and normal heart sounds.   Pulmonary/Chest: Effort normal and breath sounds normal. No respiratory distress.  Abdominal: He exhibits no distension. There is no tenderness. There is no rebound and no guarding.  Musculoskeletal: Normal range of motion.  Neurological: He is alert and oriented to person, place, and time. He has normal strength. No cranial nerve deficit or sensory deficit.  Skin: Skin is warm and dry.  Vitals reviewed.   ED Course  Procedures (including critical care time) Labs Review Labs Reviewed  CBC - Abnormal; Notable for the following:    Hemoglobin 12.2 (*)    All other components within normal limits  COMPREHENSIVE METABOLIC PANEL - Abnormal; Notable for the following:    Sodium 136 (*)    Potassium 5.4 (*)    BUN 69 (*)    Creatinine, Ser 3.24 (*)    Alkaline Phosphatase 118 (*)    GFR calc non Af Amer 19 (*)    GFR calc Af Amer 22 (*)    All other components within normal limits  URINALYSIS, ROUTINE W REFLEX MICROSCOPIC - Abnormal; Notable for the following:    APPearance CLOUDY (*)    All other components within normal limits  PRO B NATRIURETIC PEPTIDE - Abnormal; Notable for the following:    Pro B Natriuretic peptide (BNP) 1637.0 (*)    All other components within normal limits   BASIC METABOLIC PANEL - Abnormal; Notable for the following:    BUN 64 (*)    Creatinine, Ser 2.58 (*)    GFR calc non Af Amer 25 (*)    GFR calc Af Amer 29 (*)    All other components within normal limits  CBC - Abnormal; Notable for the following:    RBC 4.05 (*)    Hemoglobin 11.3 (*)    HCT 34.7 (*)    All other components within normal limits  LIPID PANEL - Abnormal; Notable for the following:    HDL 34 (*)    All other components within normal limits  MRSA PCR SCREENING  PROTIME-INR  TROPONIN I  TROPONIN I  TROPONIN I  HEMOGLOBIN A1C    Imaging Review Dg Chest 2 View  10/20/2014   CLINICAL DATA:  Dyspnea, recent left hip surgery  EXAM: CHEST  2 VIEW  COMPARISON:  09/11/2014  FINDINGS: Cardiomediastinal silhouette is stable. No acute infiltrate or pleural effusion. No pulmonary edema. Mild hyperinflation. Stable linear scarring or atelectasis bilateral midlung.  IMPRESSION: No acute infiltrate or pulmonary edema. Stable linear scarring or atelectasis bilateral midlung.   Electronically Signed   By: Natasha Mead M.D.   On: 10/20/2014 11:08   Dg Wrist Complete Right  10/19/2014   CLINICAL DATA:  Right wrist pain post fall 2 days ago  EXAM: RIGHT WRIST - COMPLETE 3+ VIEW  COMPARISON:  None.  FINDINGS: Four views of the right wrist submitted. No acute fracture or subluxation. No radiopaque foreign body.  IMPRESSION: Negative.   Electronically Signed   By: Natasha Mead M.D.   On: 10/19/2014 20:27   Dg Hip Complete Left  10/19/2014   CLINICAL DATA:  63 year old male with left knee pain after falling 2 days ago.  EXAM: LEFT HIP - COMPLETE 2+ VIEW  COMPARISON:  Prior radiographs of the pelvis 09/12/2014  FINDINGS: Surgical changes of the left hip arthroplasty  without evidence of hardware complication. The femoral head component is located within the acetabular component. Moderate degenerative osteoarthritis of the right hip joint with near total loss of joint space and diffuse subchondral  cyst formation. Multilevel degenerative disc disease in the mid and lower lumbar spine. Bony pelvis is intact. Visualized bowel gas pattern is unremarkable.  IMPRESSION: 1. Left hip arthroplasty without evidence of hardware complication. 2. 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 3. Multilevel degenerative disc disease.   Electronically Signed   By: Malachy MoanHeath  McCullough M.D.   On: 10/19/2014 23:22   Ct Head Wo Contrast  10/19/2014   CLINICAL DATA:  63 year old male with increasing confusion and frequent falls over the past 2 days.  EXAM: CT HEAD WITHOUT CONTRAST  TECHNIQUE: Contiguous axial images were obtained from the base of the skull through the vertex without intravenous contrast.  COMPARISON:  Prior CT scan of the head 07/03/2013; prior MRI brain 07/03/2013  FINDINGS: Negative for acute intracranial hemorrhage, acute infarction, mass, mass effect, hydrocephalus or midline shift. Gray-white differentiation is preserved throughout. Stable remote infarcts in the right MCA and left anterior and posterior watershed territories. Remote lacunar infarct in the inferior left basal ganglia. No focal soft tissue or calvarial abnormality. Globes and orbits are symmetric bilaterally. Normal aeration of the mastoid air cells and visualized paranasal sinuses. Atherosclerotic calcification in the bilateral cavernous and supra clinoid carotid arteries.  IMPRESSION: 1. No acute intracranial abnormality. 2. Stable remote right MCA and left frontal and posterior watershed territory infarct. 3. Stable remote left basal ganglia lacunar infarct. 4. Intracranial atherosclerosis.   Electronically Signed   By: Malachy MoanHeath  McCullough M.D.   On: 10/19/2014 21:37   Dg Knee Complete 4 Views Left  10/19/2014   CLINICAL DATA:  Acute onset of left knee pain. Status post fall 2 days ago. Initial encounter.  EXAM: LEFT KNEE - COMPLETE 4+ VIEW  COMPARISON:  Left knee radiographs performed 07/06/2013  FINDINGS: There is no  evidence of fracture or dislocation. The joint spaces are preserved. Small marginal osteophytes are noted at the patellofemoral compartment.  No significant joint effusion is seen. The visualized soft tissues are normal in appearance.  IMPRESSION: No evidence of fracture or dislocation.   Electronically Signed   By: Roanna RaiderJeffery  Chang M.D.   On: 10/19/2014 23:21     EKG Interpretation   Date/Time:  Sunday October 20 2014 00:28:31 EST Ventricular Rate:  69 PR Interval:  216 QRS Duration: 105 QT Interval:  416 QTC Calculation: 446 R Axis:   -36 Text Interpretation:  Sinus rhythm Multiple ventricular premature  complexes Prolonged PR interval Low voltage, extremity and precordial  leads Abnormal R-wave progression, late transition since last tracing no  significant change Confirmed by MILLER  MD, BRIAN ([id number]) on 10/20/2014  12:47:41 AM      MDM   Final diagnoses:  Knee pain, acute, left    63 y.o. male with pertinent PMH of nonischemic sCHF (EF 15%), COPD, CKD presents with syncope and fall without antecedent symptoms.  No recent fevers, however he has fallen numerous times.  XR obtained of painful areas and unremarkable.  Head CT negative.  Consulted medicine for admission.    1. Fall   2. Knee pain, acute, left   3. Dyspnea         Mirian MoMatthew Gentry, MD 10/20/14 [id number]

## 2014-10-19 NOTE — ED Notes (Signed)
Patient transported to CT 

## 2014-10-19 NOTE — ED Notes (Signed)
Pt aware urine sample is needed. Unable to void at this time.

## 2014-10-19 NOTE — ED Notes (Signed)
MD at bedside. 

## 2014-10-19 NOTE — H&P (Signed)
[facility] History and Physical Service Pager: [phone]  Patient name: Frank Davidson Medical record number: [id number] Date of birth: [birth year] Age: 63 y.o. Gender: male  Primary Care Provider: Abbe AmsterdamOPLAND,JESSICA, MD Consultants: None Code Status: Full  Chief Complaint: Syncope, Confusion/Aphasia  Assessment and Plan: Frank Davidson is a 63 y.o. male presenting with syncopal episodes, increased confusion, and difficulty "finding words". PMH is significant for sCHF, COPD, CKD stage III, recent left hip surgery.  # Syncope-  Recently started Entresto on Monday. States occurred when rising from seated position to standing. No pre-syncopal symptoms. DDx includes orthostatic hypotension (associated with change in position, BP 90s/60s during interview), adverse effect of Entresto, and Cardiac origin (no pre-syncopal symtoms, EF 5-10%). Per review of entresto this medication can result in hypotension in 18% of patients taking this medication and in particular can cause this in patients with HF. The overall story seems consistent with an orthostatic hypotension, though the lack of prodrome is worrisome for arrhythmia as a cause. One would also worry that he has gone in to a low output state with his cardiac function especially given his rising creatinine. Must also consider CVA as cause given history of CVA and recent word finding issues. Also consider ischemic cause as well, though troponin was negative >24 hours after the event making this less likely. - Admitted to Inpatient with telemetry, Hensel attending. - Troponin negative. Will cycle - CT Head: no acute intracranial abnormality, stable remote right MCA and left frontal and posterior watershed territory infarct, stable remote left basal ganglia lacunar infarct, intracranial athersclerosis - Xrays:  - Left Hip- left hip arthroplasty without evidence of hardware complication, moderate to advanced right    hip degenerative OA  - Left Knee- no evidence of fracture or dislocation  - Right Wrist- negative - Consult Cardiology in am - given patients level of cardiac dysfunction and concern for possible low out put state would like recs on what direction to move with his HF medications-appreciate their assistance - Follow up orthostatics. BP 90s/60s while lying during interview. - Up only with assistance - monitor on tele  # Confusion/Aphasia- History of stroke in 2014. CT head shows stable remote right MCA and left frontal and posterior watershed territory infarcts and stable remote left basal ganglia infarct. - Cycle neuro exams - will obtain risk stratification labs as last A1c in august and last lipid profile in August 2014 - does not appear to be on a statin at this time - if has change in clinical status consider further imaging  # Hyperkalemia- Potassium 5.4 at admission. Noted to be 4.6 on 11/25.  - BMP tomorrow - Will continue to monitor  # CKD Stage III- Baseline Creatinine 1.9 to 2.4. Creatinine 3.12 on 11/25. Patient is on a number of medications that could lead to decline in renal function. Would also worry about low output state from HF vs hypoperfusion related to new medication. - Creatinine 3.24 at admission. - BMP tomorrow - Consider post void residual - Currently saline locked. Use fluids with caution due to EF 5-10% - Consider nephrology consult if appropriate - nephrologist is Dr Briant CedarMattingly - hold Sherryll BurgerEntresto  # COPD: wheezing on exam. Not on any home medications for this. Normal O2 sat. - will obtain CXR - consider treating for exacerbation given shortness of breath - consider addition of albuterol if dyspnea or wheezing worsens  # Chronic sCHF- NYHA III. Currently on Torsemide 20mg  BID, Coreg 12.5mg  BID, Lisinopril 2.5mg   BID, Spironolactone 25mg , Hydralazine 50mg  TID, and Imdur 60mg . Not interested in ICD. EF 5-10% in 06/2014. Cath in 2007 showed NICM. Recent RHC revealed  stable hemodynamics and filling pressures. - BNP 1637 -  No evidence of volume overload on exam (no crackles or apparent LE edema), though patient is wheezing bringing a cardiac cause for wheeze in to question - hold demedex and metolazone at this time given concern for orthostasis and with renal dysfunction - will await cards recs - Continue Coreg, Hydralazine, Imdur, Spironolactone - Caution with fluids.  # Atrial Fibrillation- Currently in NSR.  Currently on Coreg and Eliquis. - Continue Eliquis and Coreg - Currently on telemetry. Will continue to monitor.  # Neuropathy- occurs in lower extremities bilaterally - Continue Lyrica 75mg   # Recent Hip Surgery 10/22- Left Hip Arthroplasty. Appears stable s/p fall.  - Continue Oxycodone 5mg  q4hr  FEN/GI: NPO. Prophylaxis: Eliquis  Disposition: Admitted to Inpatient with Telemetry.   History of Present Illness: Frank Davidson is a 63 y.o. male presenting with syncopal episodes x2 and increased confusion and aphasia. States he recently started a new medication Sherryll Burger) on 11/25 and was instructed to call the office if he got dizzy. He had two syncopal episodes on Thursday (11/27). The first episode occurred at 4am and the second episode at 10am the same day. Both episodes occurred when rising from seated to standing position. States he normally has low BP. Denies any pre-syncopal symptoms, saying he went to stand up and then woke up on the floor. States he remembers waking up and was not confused. Denies chest pain, shortness of breath, palpitations, shaking, incontinence, and light headedness with these episodes. Does note wheezing recently and states he has been short of breath when putting his compression stockings on. First stated niece saw one episode and that seizure like episode was not witnessed, however then stated niece was in bed during the first episode and had already left by the second episode. States he has noted right wrist pain  and left knee pain since the falls. Denies numbness or weakness in extremities, however notes he has chronic decreased sensation in his lower legs bilaterally. He has experienced no other syncopal episodes since Thursday.  Mr. Harewood states he has also noticed increased lethargy and confusion for the past few days as well. States lethargy occurred for hours prior to syncopal episode. States he has had trouble getting his thoughts together into the words he wants to say. States he was having trouble with all words and not just specific ones. Niece told him that he "isn't acting right" and that his behavior reminded her of when he was having his stroke last year. Has history of stroke in 2014. Denies changes in vision.  Has history of CHF. States he usually sleeps with one pillow at night, but for the past few nights he has slept in a chair. Denies any particular reason for sleeping in chair, stating that he has just been so tired he falls asleep there before he makes it to the bed. Denies waking up short of breath at night.  Review Of Systems: Per HPI Otherwise 12 point review of systems was performed and was unremarkable.  Patient Active Problem List   Diagnosis Date Noted  . Syncope 10/19/2014  . S/P left THA, AA 09/12/2014  . Acute on chronic renal failure 08/08/2014  . Preop cardiovascular exam 08/08/2014  . Gout 04/29/2014  . Decreased pedal pulses 04/19/2014  . Acute esophagitis 12/24/2013  .  Dysphagia, unspecified(787.20) 10/01/2013  . Diarrhea 10/01/2013  . Acute on chronic systolic heart failure   . Acute CHF 09/04/2013  . Chest pain 09/04/2013  . Ocular herpes   . Neuropathy   . Left-sided weakness   . Acute on chronic diastolic CHF (congestive heart failure), NYHA class 3 07/09/2013  . Acute ischemic stroke 07/09/2013  . OSA (obstructive sleep apnea) 07/09/2013  . Gout attack 07/07/2013  . Chronic systolic heart failure 07/05/2013  . Stroke 07/03/2013  . CVA (cerebral  infarction) 07/03/2013  . A-fib 07/03/2013  . Hypokalemia 07/03/2013  . Hypertension   . COPD (chronic obstructive pulmonary disease)   . Diabetes mellitus without complication   . CKD (chronic kidney disease), stage III   . CHF (congestive heart failure)   . Nonischemic cardiomyopathy   . Polysubstance abuse    Past Medical History: Past Medical History  Diagnosis Date  . Atrial fibrillation   . Hypertension   . COPD (chronic obstructive pulmonary disease)   . Coronary artery disease   . Ocular herpes   . Gout   . CHF (congestive heart failure)     EF 15%  . Chronic pain   . Hyperlipidemia   . Nonischemic cardiomyopathy   . Ocular herpes   . Neuropathy   . Left-sided weakness     "because of the arthritis and edema"  . Acute on chronic systolic heart failure   . Decreased pedal pulses   . Memory impairment     "short term"  . CKD (chronic kidney disease), stage III     Dr. Briant Cedar follows-holding   . Fibromyalgia     neuropathy- hands, more than feet.  . Sleep apnea with use of continuous positive airway pressure (CPAP)     does not use cpap  . GERD (gastroesophageal reflux disease)     tums as needed  . Asthma   . Type II diabetes mellitus   . Stroke X 5    last stroke aug 2014"; denies residual on 09/12/2014  . Osteoarthritis   . Arthritis     "knees, right elbow, shoulder" (09/12/2014)   Past Surgical History: Past Surgical History  Procedure Laterality Date  . 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 Tonsillectomy  age 75  . Esophagogastroduodenoscopy N/A 12/24/2013    Procedure: ESOPHAGOGASTRODUODENOSCOPY (EGD);  Surgeon: Louis Meckel, MD;  Location: [facility];  Service: Endoscopy;  Laterality: N/A;  . Colonoscopy N/A 12/24/2013    Procedure: COLONOSCOPY;  Surgeon: Louis Meckel, MD;  Location: [facility];  Service: Endoscopy;  Laterality: N/A;  . Total hip arthroplasty Left 09/12/2014  . Joint replacement    . Cardiac catheterization  10/2006;  08/2014    normal coronary arteries;   Marland Kitchen Cataract extraction w/ intraocular lens  implant, bilateral Bilateral ~ 2008  . Total hip arthroplasty Left 09/12/2014    Procedure: LEFT TOTAL HIP ARTHROPLASTY ANTERIOR APPROACH;  Surgeon: [name], MD;  Location: [facility] OR;  Service: Orthopedics;  Laterality: Left;   Social History: History  Substance Use Topics  . Smoking status: Former Smoker -- 0.50 packs/day for 10 years    Types: Cigarettes    Quit date: 12/18/2006  . Smokeless tobacco: Never Used  . Alcohol Use: Yes     Comment: Quit 2008  Please also refer to relevant sections of EMR.  Family History: Family History  Problem Relation Age of Onset  . Cancer Mother   . Hypertension Mother   .  Lung disease Mother   . Diabetes Father   . Diabetes Sister   . Hypertension Sister   . Hypertension Brother   . Hypertension Sister   . Colon cancer Neg Hx    Allergies and Medications: Allergies  Allergen Reactions  . Actos [Pioglitazone] Other (See Comments)    Caused blood in urine and fluid retention   . Januvia [Sitagliptin] Other (See Comments)    "Almost killed me"    No current facility-administered medications on file prior to encounter.   Current Outpatient Prescriptions on File Prior to Encounter  Medication Sig Dispense Refill  . allopurinol (ZYLOPRIM) 100 MG tablet Take 100 mg by mouth daily with breakfast.    . apixaban (ELIQUIS) 5 MG TABS tablet Take 5 mg by mouth 2 (two) times daily.    . Ascorbic Acid (VITAMIN C PO) Take 2 tablets by mouth daily.     . baclofen (LIORESAL) 10 MG tablet Take 10 mg by mouth every 8 (eight) hours as needed for muscle spasms.    Marland Kitchen bismuth subsalicylate (PEPTO BISMOL) 262 MG/15ML suspension Take 15 mLs by mouth every 6 (six) hours as needed for indigestion or diarrhea or loose stools.    . carvedilol (COREG) 25 MG tablet Take 12.5 mg by mouth 2 (two) times daily with a meal.     . colchicine 0.6 MG tablet Take 1.2 mg by mouth daily as  needed (Gout).    Marland Kitchen docusate sodium 100 MG CAPS Take 100 mg by mouth 2 (two) times daily. 10 capsule 0  . FLECTOR 1.3 % PTCH Place 0.5 patches onto the skin daily as needed (Pain).     Marland Kitchen glipiZIDE (GLUCOTROL) 5 MG tablet Take 5 mg by mouth 2 (two) times daily.    Chilton Si Tea, Camillia sinensis, (GREEN TEA PO) Take 1 tablet by mouth daily.    . hydrALAZINE (APRESOLINE) 50 MG tablet Take 50 mg by mouth 2 (two) times daily.    . isosorbide mononitrate (IMDUR) 60 MG 24 hr tablet Take 60 mg by mouth every morning.    . Menthol-Methyl Salicylate (MUSCLE RUB) 10-15 % CREA Apply 1 application topically 3 (three) times daily as needed for muscle pain.    . metolazone (ZAROXOLYN) 5 MG tablet Take 5 mg by mouth daily as needed (Swelling).     Marland Kitchen oxyCODONE (OXY IR/ROXICODONE) 5 MG immediate release tablet Take 1-3 tablets (5-15 mg total) by mouth every 4 (four) hours. 80 tablet 0  . polyethylene glycol (MIRALAX / GLYCOLAX) packet Take 17 g by mouth 2 (two) times daily. 14 each 0  . potassium chloride SA (K-DUR,KLOR-CON) 20 MEQ tablet Take 1 tablet (20 mEq total) by mouth daily. 30 tablet 6  . Sacubitril-Valsartan (ENTRESTO) 24-26 MG TABS Take 1 tablet by mouth 2 (two) times daily. 60 tablet 1  . spironolactone (ALDACTONE) 25 MG tablet Take 1 tablet (25 mg total) by mouth daily. 30 tablet 3  . torsemide (DEMADEX) 20 MG tablet Take 20 mg by mouth 2 (two) times daily.    . ferrous sulfate 325 (65 FE) MG tablet Take 1 tablet (325 mg total) by mouth 3 (three) times daily after meals.  3    Objective: BP 110/70 mmHg  Pulse 65  Temp(Src) 97.6 F (36.4 C) (Oral)  Resp 15  Ht 5\' 10"  (1.778 m)  Wt 243 lb (110.224 kg)  BMI 34.87 kg/m2  SpO2 99% Exam: General: 63yo male resting comfortably in no apparent distress. HEENT: Moist mucous membranes.  PERRLA. Cardiovascular: S1 and S2 noted. No murmurs/rubs/gallops. Regular rate and rhythm. PVCs noted on cardiac monitor. Respiratory: mild Expiratory wheeze noted. No  crackles noted. No increased work of breathing Abdomen: Bowel sounds noted. Soft and nondistended. No tenderness to palpation. Extremities: Compression stockings in place bilaterally. Tenderness with palpation of left knee at joint line and right wrist on the dorsal aspect, no apparent swelling of the left knee or right wrist Skin: Warm. Moist. No rashes noted. Neuro: CN II-XII intact. No focal deficits noted. Upper and Right Lower extremity muscle strength 5/5, with exception to decreased strength and ROM in left LE hip flexion and knee extension secondary to pain relating to fall on recently reconstructed right hip. Chronic decreased sensation in lower extremities unchanged per patient. Speech appears clear and aphasia is not apparent.  Labs and Imaging: CBC BMET   Recent Labs Lab 10/19/14 1828  WBC 4.4  HGB 12.2*  HCT 39.2  PLT 164    Recent Labs Lab 10/19/14 1828  NA 136*  K 5.4*  CL 102  CO2 19  BUN 69*  CREATININE 3.24*  GLUCOSE 74  CALCIUM 9.0    - Troponin negative - BNP- 1637 - EKG rate 69, first degree AV block, PVCs, unchanged since last EKG  Dg Wrist Complete Right  10/19/2014   CLINICAL DATA:  Right wrist pain post fall 2 days ago  EXAM: RIGHT WRIST - COMPLETE 3+ VIEW  COMPARISON:  None.  FINDINGS: Four views of the right wrist submitted. No acute fracture or subluxation. No radiopaque foreign body.  IMPRESSION: Negative.   Electronically Signed   By: Natasha Mead M.D.   On: 10/19/2014 20:27   Dg Hip Complete Left  10/19/2014   CLINICAL DATA:  63 year old male with left knee pain after falling 2 days ago.  EXAM: LEFT HIP - COMPLETE 2+ VIEW  COMPARISON:  Prior radiographs of the pelvis 09/12/2014  FINDINGS: Surgical changes of the left hip arthroplasty without evidence of hardware complication. The femoral head component is located within the acetabular component. Moderate degenerative osteoarthritis of the right hip joint with near total loss of joint space and  diffuse subchondral cyst formation. Multilevel degenerative disc disease in the mid and lower lumbar spine. Bony pelvis is intact. Visualized bowel gas pattern is unremarkable.  IMPRESSION: 1. Left hip arthroplasty without evidence of hardware complication. 2. 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 3. Multilevel degenerative disc disease.   Electronically Signed   By: Malachy Moan M.D.   On: 10/19/2014 23:22   Ct Head Wo Contrast  10/19/2014   CLINICAL DATA:  63 year old male with increasing confusion and frequent falls over the past 2 days.  EXAM: CT HEAD WITHOUT CONTRAST  TECHNIQUE: Contiguous axial images were obtained from the base of the skull through the vertex without intravenous contrast.  COMPARISON:  Prior CT scan of the head 07/03/2013; prior MRI brain 07/03/2013  FINDINGS: Negative for acute intracranial hemorrhage, acute infarction, mass, mass effect, hydrocephalus or midline shift. Gray-white differentiation is preserved throughout. Stable remote infarcts in the right MCA and left anterior and posterior watershed territories. Remote lacunar infarct in the inferior left basal ganglia. No focal soft tissue or calvarial abnormality. Globes and orbits are symmetric bilaterally. Normal aeration of the mastoid air cells and visualized paranasal sinuses. Atherosclerotic calcification in the bilateral cavernous and supra clinoid carotid arteries.  IMPRESSION: 1. No acute intracranial abnormality. 2. Stable remote right MCA and left frontal and posterior watershed territory infarct. 3. Stable  remote left basal ganglia lacunar infarct. 4. Intracranial atherosclerosis.   Electronically Signed   By: Malachy Moan M.D.   On: 10/19/2014 21:37   Dg Knee Complete 4 Views Left  10/19/2014   CLINICAL DATA:  Acute onset of left knee pain. Status post fall 2 days ago. Initial encounter.  EXAM: LEFT KNEE - COMPLETE 4+ VIEW  COMPARISON:  Left knee radiographs performed 07/06/2013   FINDINGS: There is no evidence of fracture or dislocation. The joint spaces are preserved. Small marginal osteophytes are noted at the patellofemoral compartment.  No significant joint effusion is seen. The visualized soft tissues are normal in appearance.  IMPRESSION: No evidence of fracture or dislocation.   Electronically Signed   By: Roanna Raider M.D.   On: 10/19/2014 23:21    Araceli Bouche, DO 10/19/2014, 11:51 PM PGY-1, [facility] FPTS Intern pager: [phone], text pages welcome   Upper Level Addendum:  I have seen and evaluated this patient along with Dr. Caroleen Hamman and reviewed the above note, making necessary revisions in red.   Marikay Alar, MD Family Medicine PGY-2

## 2014-10-19 NOTE — ED Notes (Signed)
Pt in c/o increased falls and confusion over the last two days, states he hit his head during his falls but denies LOC, alert and oriented, also c/o right wrist pain

## 2014-10-19 NOTE — ED Notes (Signed)
Patient transported to X-ray 

## 2014-10-20 ENCOUNTER — Encounter (HOSPITAL_COMMUNITY): Payer: Self-pay | Admitting: *Deleted

## 2014-10-20 ENCOUNTER — Inpatient Hospital Stay (HOSPITAL_COMMUNITY): Payer: Medicare HMO

## 2014-10-20 DIAGNOSIS — N183 Chronic kidney disease, stage 3 unspecified: Secondary | ICD-10-CM | POA: Insufficient documentation

## 2014-10-20 DIAGNOSIS — N179 Acute kidney failure, unspecified: Secondary | ICD-10-CM | POA: Insufficient documentation

## 2014-10-20 DIAGNOSIS — W19XXXA Unspecified fall, initial encounter: Secondary | ICD-10-CM | POA: Insufficient documentation

## 2014-10-20 LAB — URINALYSIS, ROUTINE W REFLEX MICROSCOPIC
BILIRUBIN URINE: NEGATIVE
GLUCOSE, UA: NEGATIVE mg/dL
Hgb urine dipstick: NEGATIVE
Ketones, ur: NEGATIVE mg/dL
LEUKOCYTES UA: NEGATIVE
Nitrite: NEGATIVE
PH: 5 (ref 5.0–8.0)
Protein, ur: NEGATIVE mg/dL
Specific Gravity, Urine: 1.01 (ref 1.005–1.030)
Urobilinogen, UA: 0.2 mg/dL (ref 0.0–1.0)

## 2014-10-20 LAB — CBC
HCT: 34.7 % — ABNORMAL LOW (ref 39.0–52.0)
Hemoglobin: 11.3 g/dL — ABNORMAL LOW (ref 13.0–17.0)
MCH: 27.9 pg (ref 26.0–34.0)
MCHC: 32.6 g/dL (ref 30.0–36.0)
MCV: 85.7 fL (ref 78.0–100.0)
Platelets: 215 K/uL (ref 150–400)
RBC: 4.05 MIL/uL — ABNORMAL LOW (ref 4.22–5.81)
RDW: 14.7 % (ref 11.5–15.5)
WBC: 6.2 K/uL (ref 4.0–10.5)

## 2014-10-20 LAB — LIPID PANEL
Cholesterol: 144 mg/dL (ref 0–200)
HDL: 34 mg/dL — ABNORMAL LOW
LDL Cholesterol: 86 mg/dL (ref 0–99)
Total CHOL/HDL Ratio: 4.2 ratio
Triglycerides: 118 mg/dL
VLDL: 24 mg/dL (ref 0–40)

## 2014-10-20 LAB — BASIC METABOLIC PANEL WITH GFR
Anion gap: 15 (ref 5–15)
BUN: 64 mg/dL — ABNORMAL HIGH (ref 6–23)
CO2: 20 meq/L (ref 19–32)
Calcium: 9.3 mg/dL (ref 8.4–10.5)
Chloride: 105 meq/L (ref 96–112)
Creatinine, Ser: 2.58 mg/dL — ABNORMAL HIGH (ref 0.50–1.35)
GFR calc Af Amer: 29 mL/min — ABNORMAL LOW
GFR calc non Af Amer: 25 mL/min — ABNORMAL LOW
Glucose, Bld: 93 mg/dL (ref 70–99)
Potassium: 5.1 meq/L (ref 3.7–5.3)
Sodium: 140 meq/L (ref 137–147)

## 2014-10-20 LAB — HEMOGLOBIN A1C
HEMOGLOBIN A1C: 5.4 % (ref <5.7)
Mean Plasma Glucose: 108 mg/dL (ref <117)

## 2014-10-20 LAB — MRSA PCR SCREENING: MRSA by PCR: NEGATIVE

## 2014-10-20 LAB — TROPONIN I: Troponin I: <0.3 ng/mL

## 2014-10-20 MED ORDER — APIXABAN 5 MG PO TABS
5.0000 mg | ORAL_TABLET | Freq: Two times a day (BID) | ORAL | Status: DC
  Administered 2014-10-20: 5 mg via ORAL
  Filled 2014-10-20 (×2): qty 1

## 2014-10-20 MED ORDER — SODIUM CHLORIDE 0.9 % IV SOLN: 250.0000 mL | INTRAVENOUS | Status: DC | PRN

## 2014-10-20 MED ORDER — CARVEDILOL 12.5 MG PO TABS
12.5000 mg | ORAL_TABLET | Freq: Two times a day (BID) | ORAL | Status: DC
  Administered 2014-10-20: 12.5 mg via ORAL
  Filled 2014-10-20 (×3): qty 1

## 2014-10-20 MED ORDER — SODIUM CHLORIDE 0.9 % IJ SOLN
3.0000 mL | Freq: Two times a day (BID) | INTRAMUSCULAR | Status: DC
  Administered 2014-10-20: 3 mL via INTRAVENOUS

## 2014-10-20 MED ORDER — SPIRONOLACTONE 25 MG PO TABS
25.0000 mg | ORAL_TABLET | Freq: Every day | ORAL | Status: DC
  Administered 2014-10-20: 25 mg via ORAL
  Filled 2014-10-20: qty 1

## 2014-10-20 MED ORDER — OXYCODONE HCL 5 MG PO TABS
5.0000 mg | ORAL_TABLET | ORAL | Status: DC
Start: 2014-10-20 — End: 2014-10-20
  Administered 2014-10-20 (×3): 5 mg via ORAL
  Filled 2014-10-20 (×3): qty 1

## 2014-10-20 MED ORDER — PREGABALIN 25 MG PO CAPS
75.0000 mg | ORAL_CAPSULE | Freq: Every day | ORAL | Status: DC
  Administered 2014-10-20: 75 mg via ORAL
  Filled 2014-10-20: qty 3

## 2014-10-20 MED ORDER — SODIUM CHLORIDE 0.9 % IJ SOLN: 3.0000 mL | INTRAMUSCULAR | Status: DC | PRN

## 2014-10-20 MED ORDER — ISOSORBIDE MONONITRATE ER 60 MG PO TB24
60.0000 mg | ORAL_TABLET | Freq: Every morning | ORAL | Status: DC
  Administered 2014-10-20: 60 mg via ORAL
  Filled 2014-10-20: qty 1

## 2014-10-20 MED ORDER — HYDRALAZINE HCL 50 MG PO TABS
50.0000 mg | ORAL_TABLET | Freq: Two times a day (BID) | ORAL | Status: DC
  Administered 2014-10-20: 50 mg via ORAL
  Filled 2014-10-20 (×2): qty 1

## 2014-10-20 NOTE — Discharge Instructions (Signed)
I'm so glad you are feeling better! I've discussed your medications with Dr. Gala Romney at the [facility]. He recommended stopping your Surveyor, minerals. We will also hold your Torsemide for two days...you may restart this medication on Tuesday, December 1. We also recommend that you stop taking the Lisinopril until after you are seen in clinic. The [facility] will call you with an appointment time.  Please call the [facility] if you have any questions or if your symptoms worsen.  SUMMARY: 1. Stop taking Entresto and Corlanor 2. Do not take Torsemide for two days. Restart December 1. 3. Stop taking Lisinopril. Will discuss at HF Clinic.

## 2014-10-20 NOTE — Discharge Summary (Signed)
Family Medicine Teaching [facility] Discharge Summary  Patient name: Frank Davidson Medical record number: [id number] Date of birth: 11-Oct-1951 Age: 63 y.o. Gender: male Date of Admission: 10/19/2014  Date of Discharge: 10/20/14 Admitting Physician: Sanjuana Letters, MD  Primary Care Provider: Abbe Amsterdam, MD Consultants: Cardiology  Indication for Hospitalization: Syncope  Discharge Diagnoses/Problem List:  Syncope CHF HTN  Disposition: Discharge Home  Discharge Condition: Stable  Brief Hospital Course:  Frank Davidson presented to the ED at [facility] on 10/19/14 with two episodes of syncope on 11/26 and aphasia at home. States he just started taking two new medications, Entresto and Corlanor, on the Monday (11/23) prior to presentation. States syncope occurred when rising from a seated position to standing and he did not note any pre-syncopal symptoms.  Entresto and Colanor were discontinued at admission. Troponins were negative. CT head showed no acute intracranial abnormalities, however did show stable old infarcts. Complained of left knee and hip as well as right wrist pain secondary to falling from syncopal episode. Xrays showed no acute injury. Orthostatics were normal. Potassium was 5.4 at admission, however this trended down to 5.1 at discharge. Cardiology was contacted and new Frank Davidson history well. Syncopal episodes most likely related to start of new medications and losing too much fluid. Recommend discharging Frank Davidson, discontinuing new medications, holding his home torsemide for two days (restart on December 1), and holding Lisinopril until he follows up at [facility] Clinic.  No episodes of aphasia occurred during hospitalization. Frank Davidson was discharged on 11/29 following improvement in blood pressure and resolved aphasia.  Issues for Follow Up:  - Will be contacted by HF Clinic concerning follow up appointment - HF team recommends discontinuing  Entresto and Corlanor, holding Torsemide for 2 days (restart on December 1), and holding Lisinopril until follow up at HF Clinic. - Follow up BP. Consider rechecking orthostatics if appropriate.  - Follow up BMP to check Cr and K.  Significant Procedures: None  Significant Labs and Imaging:   Recent Labs Lab 10/19/14 1828 10/20/14 0301  WBC 4.4 6.2  HGB 12.2* 11.3*  HCT 39.2 34.7*  PLT 164 215    Recent Labs Lab 10/16/14 1105 10/19/14 1828 10/20/14 0301  NA 133* 136* 140  K 4.6 5.4* 5.1  CL 97 102 105  CO2 22 19 20   GLUCOSE 80 74 93  BUN 56* 69* 64*  CREATININE 3.12* 3.24* 2.58*  CALCIUM 9.3 9.0 9.3  ALKPHOS  --  118*  --   AST  --  22  --   ALT  --  10  --   ALBUMIN  --  3.8  --    Lipid Panel     Component Value Date/Time   CHOL 144 10/20/2014 0858   TRIG 118 10/20/2014 0858   HDL 34* 10/20/2014 0858   CHOLHDL 4.2 10/20/2014 0858   VLDL 24 10/20/2014 0858   LDLCALC 86 10/20/2014 0858   Urinalysis    Component Value Date/Time   COLORURINE YELLOW 10/20/2014 0045   APPEARANCEUR CLOUDY* 10/20/2014 0045   LABSPEC 1.010 10/20/2014 0045   PHURINE 5.0 10/20/2014 0045   GLUCOSEU NEGATIVE 10/20/2014 0045   HGBUR NEGATIVE 10/20/2014 0045   BILIRUBINUR NEGATIVE 10/20/2014 0045   BILIRUBINUR small 09/28/2013 1616   KETONESUR NEGATIVE 10/20/2014 0045   PROTEINUR NEGATIVE 10/20/2014 0045   PROTEINUR trace 09/28/2013 1616   UROBILINOGEN 0.2 10/20/2014 0045   UROBILINOGEN 0.2 09/28/2013 1616   NITRITE NEGATIVE 10/20/2014 0045   NITRITE neg  09/28/2013 1616   LEUKOCYTESUR NEGATIVE 10/20/2014 0045  - Troponin negative - BNP 1637 - Hemoglobin A1C 5.4  Dg Chest 2 View  10/20/2014   CLINICAL DATA:  Dyspnea, recent left hip surgery  EXAM: CHEST  2 VIEW  COMPARISON:  09/11/2014  FINDINGS: Cardiomediastinal silhouette is stable. No acute infiltrate or pleural effusion. No pulmonary edema. Mild hyperinflation. Stable linear scarring or atelectasis bilateral midlung.   IMPRESSION: No acute infiltrate or pulmonary edema. Stable linear scarring or atelectasis bilateral midlung.   Electronically Signed   By: Natasha Mead M.D.   On: 10/20/2014 11:08   Dg Wrist Complete Right  10/19/2014   CLINICAL DATA:  Right wrist pain post fall 2 days ago  EXAM: RIGHT WRIST - COMPLETE 3+ VIEW  COMPARISON:  None.  FINDINGS: Four views of the right wrist submitted. No acute fracture or subluxation. No radiopaque foreign body.  IMPRESSION: Negative.   Electronically Signed   By: Natasha Mead M.D.   On: 10/19/2014 20:27   Dg Hip Complete Left  10/19/2014   CLINICAL DATA:  63 year old male with left knee pain after falling 2 days ago.  EXAM: LEFT HIP - COMPLETE 2+ VIEW  COMPARISON:  Prior radiographs of the pelvis 09/12/2014  FINDINGS: Surgical changes of the left hip arthroplasty without evidence of hardware complication. The femoral head component is located within the acetabular component. Moderate degenerative osteoarthritis of the right hip joint with near total loss of joint space and diffuse subchondral cyst formation. Multilevel degenerative disc disease in the mid and lower lumbar spine. Bony pelvis is intact. Visualized bowel gas pattern is unremarkable.  IMPRESSION: 1. Left hip arthroplasty without evidence of hardware complication. 2. 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 3. Multilevel degenerative disc disease.   Electronically Signed   By: Malachy Moan M.D.   On: 10/19/2014 23:22   Ct Head Wo Contrast  10/19/2014   CLINICAL DATA:  63 year old male with increasing confusion and frequent falls over the past 2 days.  EXAM: CT HEAD WITHOUT CONTRAST  TECHNIQUE: Contiguous axial images were obtained from the base of the skull through the vertex without intravenous contrast.  COMPARISON:  Prior CT scan of the head 07/03/2013; prior MRI brain 07/03/2013  FINDINGS: Negative for acute intracranial hemorrhage, acute infarction, mass, mass effect, hydrocephalus or  midline shift. Gray-white differentiation is preserved throughout. Stable remote infarcts in the right MCA and left anterior and posterior watershed territories. Remote lacunar infarct in the inferior left basal ganglia. No focal soft tissue or calvarial abnormality. Globes and orbits are symmetric bilaterally. Normal aeration of the mastoid air cells and visualized paranasal sinuses. Atherosclerotic calcification in the bilateral cavernous and supra clinoid carotid arteries.  IMPRESSION: 1. No acute intracranial abnormality. 2. Stable remote right MCA and left frontal and posterior watershed territory infarct. 3. Stable remote left basal ganglia lacunar infarct. 4. Intracranial atherosclerosis.   Electronically Signed   By: Malachy Moan M.D.   On: 10/19/2014 21:37   Dg Knee Complete 4 Views Left  10/19/2014   CLINICAL DATA:  Acute onset of left knee pain. Status post fall 2 days ago. Initial encounter.  EXAM: LEFT KNEE - COMPLETE 4+ VIEW  COMPARISON:  Left knee radiographs performed 07/06/2013  FINDINGS: There is no evidence of fracture or dislocation. The joint spaces are preserved. Small marginal osteophytes are noted at the patellofemoral compartment.  No significant joint effusion is seen. The visualized soft tissues are normal in appearance.  IMPRESSION: No evidence of fracture  or dislocation.   Electronically Signed   By: Roanna RaiderJeffery  Chang M.D.   On: 10/19/2014 23:21   Results/Tests Pending at Time of Discharge: None  Discharge Medications:    Medication List    STOP taking these medications        CORLANOR 5 MG Tabs tablet  Generic drug:  ivabradine     Sacubitril-Valsartan 24-26 MG Tabs  Commonly known as:  ENTRESTO     torsemide 20 MG tablet  Commonly known as:  DEMADEX      TAKE these medications        allopurinol 100 MG tablet  Commonly known as:  ZYLOPRIM  Take 100 mg by mouth daily with breakfast.     apixaban 5 MG Tabs tablet  Commonly known as:  ELIQUIS  Take 5 mg  by mouth 2 (two) times daily.     baclofen 10 MG tablet  Commonly known as:  LIORESAL  Take 10 mg by mouth every 8 (eight) hours as needed for muscle spasms.     bismuth subsalicylate 262 MG/15ML suspension  Commonly known as:  PEPTO BISMOL  Take 15 mLs by mouth every 6 (six) hours as needed for indigestion or diarrhea or loose stools.     carvedilol 25 MG tablet  Commonly known as:  COREG  Take 12.5 mg by mouth 2 (two) times daily with a meal.     colchicine 0.6 MG tablet  Take 1.2 mg by mouth daily as needed (Gout).     DSS 100 MG Caps  Take 100 mg by mouth 2 (two) times daily.     ferrous sulfate 325 (65 FE) MG tablet  Take 1 tablet (325 mg total) by mouth 3 (three) times daily after meals.     FLECTOR 1.3 % Ptch  Generic drug:  diclofenac  Place 0.5 patches onto the skin daily as needed (Pain).     glipiZIDE 5 MG tablet  Commonly known as:  GLUCOTROL  Take 5 mg by mouth 2 (two) times daily.     GREEN TEA PO  Take 1 tablet by mouth daily.     hydrALAZINE 50 MG tablet  Commonly known as:  APRESOLINE  Take 50 mg by mouth 2 (two) times daily.     isosorbide mononitrate 60 MG 24 hr tablet  Commonly known as:  IMDUR  Take 60 mg by mouth every morning.     LYRICA 75 MG capsule  Generic drug:  pregabalin  Take 75 mg by mouth daily.     metolazone 5 MG tablet  Commonly known as:  ZAROXOLYN  Take 5 mg by mouth daily as needed (Swelling).     MUSCLE RUB 10-15 % Crea  Apply 1 application topically 3 (three) times daily as needed for muscle pain.     oxyCODONE 5 MG immediate release tablet  Commonly known as:  Oxy IR/ROXICODONE  Take 1-3 tablets (5-15 mg total) by mouth every 4 (four) hours.     polyethylene glycol packet  Commonly known as:  MIRALAX / GLYCOLAX  Take 17 g by mouth 2 (two) times daily.     potassium chloride SA 20 MEQ tablet  Commonly known as:  K-DUR,KLOR-CON  Take 1 tablet (20 mEq total) by mouth daily.     spironolactone 25 MG tablet   Commonly known as:  ALDACTONE  Take 1 tablet (25 mg total) by mouth daily.     VITAMIN C PO  Take 2 tablets by mouth daily.  Discharge Instructions: Please refer to Patient Instructions section of EMR for full details.  Patient was counseled important signs and symptoms that should prompt return to medical care, changes in medications, dietary instructions, activity restrictions, and follow up appointments.   Follow-Up Appointments:     Follow-up Information    Follow up with Arvilla Meres, MD.   Specialty:  Cardiology   Why:  Clinic will call you with appointment time.   Contact information:   [address] [place] [address] [phone]       Follow up with Abbe Amsterdam, MD.   Specialty:  [facility] Medicine   Contact information:   [address] [place] [address] [phone]       Araceli Bouche, [place] 10/20/2014, 10:47 PM PGY-1, [facility]

## 2014-10-20 NOTE — Progress Notes (Signed)
Pt sitting up edge of bed eating late breakfast tray, no needs at this time. Walker set up for pt to ambulate in room with assistance. Asked to use phone to get in touch with niece.

## 2014-10-20 NOTE — Progress Notes (Signed)
Patient arrived to floor via stretcher awake AAOx4. Lungs are CTA, but diminished in b/l upper and lower lobes. Patient does become dyspneic with minimal exertion. Patient MAE, but gait is somewhat unsteady.  Patient maintained on telemetry and is in NSR. Patient oriented to room and  Call bell system, and verbalized understanding. Will continue to monitor.  Sharlene Dory, RN

## 2014-10-20 NOTE — Progress Notes (Signed)
Pt. discharge home, given discharge instructions verbalized understanding. pts sister at the bedside and agreed to take pt home. Pt wheeled down to the exit and given personal belongings from security.

## 2014-10-20 NOTE — Progress Notes (Signed)
Family Medicine Teaching Service Daily Progress Note Intern Pager: [phone]  Patient name: Frank Davidson Medical record number: [id number] Date of birth: 08/16/1951 Age: 63 y.o. Gender: male  Primary Care Provider: Abbe Amsterdam, MD Consultants: None Code Status: Full  Assessment and Plan: Frank Davidson is a 63 y.o. male presenting with syncopal episodes, increased confusion, and difficulty "finding words". PMH is significant for sCHF, COPD, CKD stage III, recent left hip surgery.  # Syncope- Recently started Entresto and Corlanor on Monday. Was instructed not to take Corlanor because of bradycardia. - Troponin negative - CT Head: no acute intracranial abnormality, stable remote right MCA and left frontal and posterior watershed territory infarct, stable remote left basal ganglia lacunar infarct, intracranial athersclerosis - Xrays: - Left Hip- left hip arthroplasty without evidence of hardware complication, moderate to advanced right hip degenerative OA - Left Knee- no evidence of fracture or dislocation - Right Wrist- negative - Consult Cardiology this am - given patients level of cardiac dysfunction and concern for possible low out put state would like recs on what direction to move with his HF medications-appreciate their assistance  - Recommend stopping Entresto, Holding Torsemide for two days and restarting on Tuesday (12/1), and holding Lisinopril. - Orthostatics:  - Lying: 100/50, HR 67  - Sitting: 105/65, HR 45  - Standing : 115/79, HR 77  - Standing : 170/148, HR 76 - Up only with assistance - Monitor on tele, consider repeat echo and carotid dopplers  # Confusion/Aphasia- History of stroke in 2014. CT head shows stable remote right MCA and left frontal and posterior watershed territory infarcts and stable remote left basal ganglia infarct. - Consider MRI Head if condition worsens - Cycle neuro exams -  Follow up risk stratification labs (A1C, lipid panel)  # COPD-  Not currently on medicaiton. States this was cured after taking 6months of Alfalfa. - O2 sat 100% on Room Air - Suspect wheeze at admission is cardiac or upper respiratory related. No wheezing noted today. - Follow up CXR - Consider initiating albuterol PRN  # Hyperkalemia- Potassium 5.4 at admission. Noted to be 4.6 on 11/25.  - Potassium 5.1 this morning. - Will continue to monitor  # CKD Stage III- Baseline Creatinine 1.9 to 2.4.  Creatinine 3.24 at admission. - Creatinine 2.58 this morning - Consider post void residual - Currently saline locked. Use fluids with caution due to EF 5-10% - Consider nephrology consult if appropriate - nephrologist is Dr Briant Cedar  # Chronic sCHF- NYHA III. Currently on Torsemide  BID, Coreg 12.5mg  BID, Lisinopril 2.5mg  BID, Spironolactone , Hydralazine  TID, and Imdur . Not interested in ICD. EF 5-10% in 06/2014. Cath in 2007 showed NICM. Recent RHC revealed stable hemodynamics and filling pressures. - BNP 1637 - No evidence of volume overload on exam (no crackles or apparent LE edema) - Hold demedex and metolazone at this time given concern for orthostasis and with renal dysfunction - will await cards recs - Continue Coreg, Hydralazine, Imdur, Spironolactone - Caution with fluids. - Follow up CXR  # Atrial Fibrillation- Currently in NSR. Currently on Coreg and Eliquis. - Continue Eliquis and Coreg - Currently on telemetry. Will continue to monitor.  # Neuropathy- occurs in lower extremities bilaterally - Continue Lyrica   # Recent Hip Surgery 10/22- Left Hip Arthroplasty. Appears stable s/p fall.  - Continue Oxycodone  q4hr   FEN/GI: Heart Diet PPx: Eliquis  Disposition: Admitted to [facility] Medicine Teaching Service.  Subjective:  No acute complaints overnight.  States he is feeling much better today. No more episodes of dizziness or syncope. States he  has not had trouble findings words since hospitalization. Denies trouble with urination. States he had COPD a few years ago, however this was treated with a few months of Alfalfa. States all of his symptoms started after taking two medications; he does not remember the name of one medication, however he is sure one was Corlanor.  He is anxious about starting any new medications since he has a history of being sensitive to changes in his medications.  Objective: Temp:  [97.6 F (36.4 C)-98.1 F (36.7 C)] 98.1 F (36.7 C) (11/29 0649) Pulse Rate:  [39-72] 67 (11/29 0649) Resp:  [10-18] 16 (11/29 0649) BP: (88-117)/(50-70) 100/50 mmHg (11/29 0649) SpO2:  [95 %-100 %] 100 % (11/29 0649) Weight:  [237 lb 12.8 oz (107.865 kg)-243 lb (110.224 kg)] 237 lb 12.8 oz (107.865 kg) (11/29 0154) Physical Exam: General: 63yo male resting comfortably on side of bed in no apparent distress Cardiovascular: S1 and S2 noted. No murmurs, rubs, or gallops Respiratory: Clear to auscultation bilaterally. Decreased air movement. No wheezes noted. No increased work of breathing.  Laboratory:  Recent Labs Lab 10/19/14 1828 10/20/14 0301  WBC 4.4 6.2  HGB 12.2* 11.3*  HCT 39.2 34.7*  PLT 164 215    Recent Labs Lab 10/16/14 1105 10/19/14 1828 10/20/14 0301  NA 133* 136* 140  K 4.6 5.4* 5.1  CL 97 102 105  CO2 22 19 20   BUN 56* 69* 64*  CREATININE 3.12* 3.24* 2.58*  CALCIUM 9.3 9.0 9.3  PROT  --  7.6  --   BILITOT  --  0.3  --   ALKPHOS  --  118*  --   ALT  --  10  --   AST  --  22  --   GLUCOSE 80 74 93  - Troponins negative - BNP 1637  Imaging/Diagnostic Tests: Dg Wrist Complete Right  10/19/2014   CLINICAL DATA:  Right wrist pain post fall 2 days ago  EXAM: RIGHT WRIST - COMPLETE 3+ VIEW  COMPARISON:  None.  FINDINGS: Four views of the right wrist submitted. No acute fracture or subluxation. No radiopaque foreign body.  IMPRESSION: Negative.   Electronically Signed   By: Natasha Mead M.D.    On: 10/19/2014 20:27   Dg Hip Complete Left  10/19/2014   CLINICAL DATA:  63 year old male with left knee pain after falling 2 days ago.  EXAM: LEFT HIP - COMPLETE 2+ VIEW  COMPARISON:  Prior radiographs of the pelvis 09/12/2014  FINDINGS: Surgical changes of the left hip arthroplasty without evidence of hardware complication. The femoral head component is located within the acetabular component. Moderate degenerative osteoarthritis of the right hip joint with near total loss of joint space and diffuse subchondral cyst formation. Multilevel degenerative disc disease in the mid and lower lumbar spine. Bony pelvis is intact. Visualized bowel gas pattern is unremarkable.  IMPRESSION: 1. Left hip arthroplasty without evidence of hardware complication. 2. 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 3. Multilevel degenerative disc disease.   Electronically Signed   By: Malachy Moan M.D.   On: 10/19/2014 23:22   Ct Head Wo Contrast  10/19/2014   CLINICAL DATA:  63 year old male with increasing confusion and frequent falls over the past 2 days.  EXAM: CT HEAD WITHOUT CONTRAST  TECHNIQUE: Contiguous axial images were obtained from the base of the skull through the vertex without intravenous contrast.  COMPARISON:  Prior CT scan of the head 07/03/2013; prior MRI brain 07/03/2013  FINDINGS: Negative for acute intracranial hemorrhage, acute infarction, mass, mass effect, hydrocephalus or midline shift. Gray-white differentiation is preserved throughout. Stable remote infarcts in the right MCA and left anterior and posterior watershed territories. Remote lacunar infarct in the inferior left basal ganglia. No focal soft tissue or calvarial abnormality. Globes and orbits are symmetric bilaterally. Normal aeration of the mastoid air cells and visualized paranasal sinuses. Atherosclerotic calcification in the bilateral cavernous and supra clinoid carotid arteries.  IMPRESSION: 1. No acute intracranial  abnormality. 2. Stable remote right MCA and left frontal and posterior watershed territory infarct. 3. Stable remote left basal ganglia lacunar infarct. 4. Intracranial atherosclerosis.   Electronically Signed   By: Malachy MoanHeath  McCullough M.D.   On: 10/19/2014 21:37   Dg Knee Complete 4 Views Left  10/19/2014   CLINICAL DATA:  Acute onset of left knee pain. Status post fall 2 days ago. Initial encounter.  EXAM: LEFT KNEE - COMPLETE 4+ VIEW  COMPARISON:  Left knee radiographs performed 07/06/2013  FINDINGS: There is no evidence of fracture or dislocation. The joint spaces are preserved. Small marginal osteophytes are noted at the patellofemoral compartment.  No significant joint effusion is seen. The visualized soft tissues are normal in appearance.  IMPRESSION: No evidence of fracture or dislocation.   Electronically Signed   By: Roanna RaiderJeffery  Chang M.D.   On: 10/19/2014 23:21   Araceli BoucheRaleigh N Rumley, DO 10/20/2014, 7:03 AM PGY-1, [facility] FPTS Intern pager: [phone], text pages welcome

## 2014-10-21 NOTE — Progress Notes (Signed)
UR Completed.  [phone]  

## 2014-10-25 ENCOUNTER — Ambulatory Visit (HOSPITAL_COMMUNITY)
Admission: RE | Admit: 2014-10-25 | Discharge: 2014-10-25 | Disposition: A | Discharge status: Home / Self Care | Payer: Commercial Managed Care - HMO | Source: Ambulatory Visit | Attending: Internal Medicine | Admitting: Internal Medicine

## 2014-10-25 ENCOUNTER — Encounter (HOSPITAL_COMMUNITY): Payer: Self-pay

## 2014-10-25 VITALS — BP 110/61 | HR 66 | Resp 18 | Wt 239.5 lb

## 2014-10-25 DIAGNOSIS — E669 Obesity, unspecified: Secondary | ICD-10-CM | POA: Diagnosis not present

## 2014-10-25 DIAGNOSIS — J449 Chronic obstructive pulmonary disease, unspecified: Secondary | ICD-10-CM | POA: Diagnosis not present

## 2014-10-25 DIAGNOSIS — M109 Gout, unspecified: Secondary | ICD-10-CM | POA: Diagnosis not present

## 2014-10-25 DIAGNOSIS — I4891 Unspecified atrial fibrillation: Secondary | ICD-10-CM | POA: Insufficient documentation

## 2014-10-25 DIAGNOSIS — I5022 Chronic systolic (congestive) heart failure: Secondary | ICD-10-CM

## 2014-10-25 DIAGNOSIS — I2510 Atherosclerotic heart disease of native coronary artery without angina pectoris: Secondary | ICD-10-CM | POA: Diagnosis not present

## 2014-10-25 DIAGNOSIS — Z8673 Personal history of transient ischemic attack (TIA), and cerebral infarction without residual deficits: Secondary | ICD-10-CM | POA: Insufficient documentation

## 2014-10-25 DIAGNOSIS — M25552 Pain in left hip: Secondary | ICD-10-CM | POA: Diagnosis not present

## 2014-10-25 DIAGNOSIS — I129 Hypertensive chronic kidney disease with stage 1 through stage 4 chronic kidney disease, or unspecified chronic kidney disease: Secondary | ICD-10-CM | POA: Diagnosis not present

## 2014-10-25 DIAGNOSIS — Z79899 Other long term (current) drug therapy: Secondary | ICD-10-CM | POA: Diagnosis not present

## 2014-10-25 DIAGNOSIS — J45909 Unspecified asthma, uncomplicated: Secondary | ICD-10-CM | POA: Insufficient documentation

## 2014-10-25 DIAGNOSIS — N183 Chronic kidney disease, stage 3 unspecified: Secondary | ICD-10-CM

## 2014-10-25 DIAGNOSIS — E118 Type 2 diabetes mellitus with unspecified complications: Secondary | ICD-10-CM | POA: Diagnosis not present

## 2014-10-25 DIAGNOSIS — M1990 Unspecified osteoarthritis, unspecified site: Secondary | ICD-10-CM | POA: Diagnosis not present

## 2014-10-25 DIAGNOSIS — K219 Gastro-esophageal reflux disease without esophagitis: Secondary | ICD-10-CM | POA: Diagnosis not present

## 2014-10-25 DIAGNOSIS — G4730 Sleep apnea, unspecified: Secondary | ICD-10-CM | POA: Insufficient documentation

## 2014-10-25 DIAGNOSIS — M797 Fibromyalgia: Secondary | ICD-10-CM | POA: Insufficient documentation

## 2014-10-25 DIAGNOSIS — Z79891 Long term (current) use of opiate analgesic: Secondary | ICD-10-CM | POA: Diagnosis not present

## 2014-10-25 DIAGNOSIS — E785 Hyperlipidemia, unspecified: Secondary | ICD-10-CM | POA: Diagnosis not present

## 2014-10-25 DIAGNOSIS — Z7901 Long term (current) use of anticoagulants: Secondary | ICD-10-CM | POA: Insufficient documentation

## 2014-10-25 DIAGNOSIS — G629 Polyneuropathy, unspecified: Secondary | ICD-10-CM | POA: Diagnosis not present

## 2014-10-25 DIAGNOSIS — I480 Paroxysmal atrial fibrillation: Secondary | ICD-10-CM

## 2014-10-25 LAB — BASIC METABOLIC PANEL
Anion gap: 18 — ABNORMAL HIGH (ref 5–15)
BUN: 44 mg/dL — AB (ref 6–23)
CHLORIDE: 103 meq/L (ref 96–112)
CO2: 19 meq/L (ref 19–32)
CREATININE: 2.25 mg/dL — AB (ref 0.50–1.35)
Calcium: 10.2 mg/dL (ref 8.4–10.5)
GFR calc Af Amer: 34 mL/min — ABNORMAL LOW (ref >90)
GFR calc non Af Amer: 29 mL/min — ABNORMAL LOW (ref >90)
Glucose, Bld: 88 mg/dL (ref 70–99)
POTASSIUM: 5.3 meq/L (ref 3.7–5.3)
Sodium: 140 mEq/L (ref 137–147)

## 2014-10-25 LAB — PRO B NATRIURETIC PEPTIDE: Pro B Natriuretic peptide (BNP): 3555 pg/mL — ABNORMAL HIGH (ref 0–125)

## 2014-10-25 NOTE — Progress Notes (Addendum)
Patient ID: Frank [place], male   DOB: Jun 20, 1951, 63 y.o.   MRN: [id number]  PCP: Adline Mango FNP GI: Dr Yves Dill Nephrologist: Dr. Briant Cedar  HPI: Frank [place] is a 63 yo male with a hx of obesity, PAF, HTN, COPD, HTN, gout, DM 2, CVA and CHF due to NICM.  Has previously followed in the [place] in [place]. Previous cath in 2007 showed NICM with normal cors. Was living alone in [place] and apparently had issues managing medications by himself and was getting confused with PCP and cardiologists medications and was not taking his meds correctly. He moved to [place] to be near his nieces. Issues in the past with ACE-I due to renal failure. Highest creatinine in our system is ~1.5 but was apparently higher previously - thought to be related to taking his medications incorrectly.   Was admitted in 06/2013 from [place] with left sided weakness and was found to have a moderate to large right hemispheric infarct. Went back to [place].   Admitted to [place] 10/14 through 09/09/13 with ADHF. Diuresed 30 pounds with IV lasix and transitioned to lasix 80 mg po bid. Continued on 25 mg carvedilol bid, isordil 20 mg daily.  Started on lisinopril 2.5 mg daily. Discharge weight 229 pounds. 09/29/13 CT abdomen and pelvis - no obstruction . Diverticulosis. No inflammatory changes.   Follow up for Heart Failure: Last visit stopped lisinopril and held torsemide for 3 days. We tried to start Entresto 24-26 BID, however he did not tolerate the medication and presented to the ED after 4 days of taking medication after he passed out. Found to be hypotensive and ARF. Having some minimal SOB. No longer having dizziness. Denies CP, orthopnea, PND or LE edema. +walking with walker and hip pain good. Still walking 2 miles a day. Weight at home 238 lbs. Following a low salt diet and drinking less than 2L a day.   ECHO 11/08/13 EF 15%  ECHO 07/05/14: EF 15%   08/13/14 RA = 7  RV = 50/3/10  PA = 54/22 (34)   PCW = 16  Fick cardiac output/index = 6.9/3.0  Thermo CO/CI = 4.7/2.1  PVR = 4.1 WU  Ao sat = 94%  PA sat = 68%,68%  SVC sat (sheath) = 62%   Labs 10/01/13 Dig level 2.5 --->dig stopped Labs 10/05/13 K 4.8 Creatinine 1.79 Labs 11/01/13 K 3.3 --Supplemented Creatinine 1.3 Uric Acid 9.3 AST 23 ALT 12 Hemoglobin 13 Hemoglobin 7.2  Labs 11/21/13 K 4.4 Creatinine 1.3            01/02/14 K 4.2, Creatinine 1.1            01/10/14 K 4.6, Creatinine 1.28           04/22/14 K 4.0 Creatinine 1.58         05/13/14 K 4.3, creatinine 1.46, pro-BNP 1377          06/24/14: K 3.9, creatinine 1.49         07/02/14 K 4.5, creatinine 1.82, pro-BNP 696.7         07/16/14 K 3.7, creatinine 2.41         08/01/14 K 4.3 creatinine 2.6 (demadex held for 2 days) - then restarted at 40 bid (baseline dose)         08/22/14 K 4.4 creatinine 1.6         09/02/14 Creatinine 1.6        10/20/14: creatinine 2.58, K 5.1  SH: Retired school  teacher 2011 Lives with his niece  FH: Father DM  Mom had lung cancer  ROS: All systems negative except as listed in HPI, PMH and Problem List.  Past Medical History  Diagnosis Date  . Atrial fibrillation   . Hypertension   . COPD (chronic obstructive pulmonary disease)   . Coronary artery disease   . Ocular herpes   . Gout   . CHF (congestive heart failure)     EF 15%  . Chronic pain   . Hyperlipidemia   . Nonischemic cardiomyopathy   . Ocular herpes   . Neuropathy   . Left-sided weakness     "because of the arthritis and edema"  . Acute on chronic systolic heart failure   . Decreased pedal pulses   . Memory impairment     "short term"  . CKD (chronic kidney disease), stage III     Dr. Briant CedarMattingly follows-holding   . Fibromyalgia     neuropathy- hands, more than feet.  . Sleep apnea with use of continuous positive airway pressure (CPAP)     does not use cpap  . GERD (gastroesophageal reflux disease)     tums as needed  . Asthma   . Type II diabetes mellitus   .  Stroke X 5    last stroke aug 2014"; denies residual on 09/12/2014  . Osteoarthritis   . Arthritis     "knees, right elbow, shoulder" (09/12/2014)    Current Outpatient Prescriptions  Medication Sig Dispense Refill  . allopurinol (ZYLOPRIM) 100 MG tablet Take 100 mg by mouth daily with breakfast.    . apixaban (ELIQUIS) 5 MG TABS tablet Take 5 mg by mouth 2 (two) times daily.    . Ascorbic Acid (VITAMIN C PO) Take 2 tablets by mouth daily.     . baclofen (LIORESAL) 10 MG tablet Take 10 mg by mouth every 8 (eight) hours as needed for muscle spasms.    Marland Kitchen. bismuth subsalicylate (PEPTO BISMOL) 262 MG/15ML suspension Take 15 mLs by mouth every 6 (six) hours as needed for indigestion or diarrhea or loose stools.    . carvedilol (COREG) 25 MG tablet Take 12.5 mg by mouth 2 (two) times daily with a meal.     . colchicine 0.6 MG tablet Take 1.2 mg by mouth daily as needed (Gout).    Marland Kitchen. docusate sodium 100 MG CAPS Take 100 mg by mouth 2 (two) times daily. 10 capsule 0  . ferrous sulfate 325 (65 FE) MG tablet Take 1 tablet (325 mg total) by mouth 3 (three) times daily after meals.  3  . FLECTOR 1.3 % PTCH Place 0.5 patches onto the skin daily as needed (Pain).     Marland Kitchen. glipiZIDE (GLUCOTROL) 5 MG tablet Take 5 mg by mouth 2 (two) times daily.    [place] Si. Green Tea, Camillia sinensis, (GREEN TEA PO) Take 1 tablet by mouth daily.    . hydrALAZINE (APRESOLINE) 50 MG tablet Take 50 mg by mouth 2 (two) times daily.    . isosorbide mononitrate (IMDUR) 60 MG 24 hr tablet TAKE 1 TABLET (60 MG TOTAL) BY MOUTH DAILY. 30 tablet 3  . Menthol-Methyl Salicylate (MUSCLE RUB) 10-15 % CREA Apply 1 application topically 3 (three) times daily as needed for muscle pain.    . metolazone (ZAROXOLYN) 5 MG tablet Take 5 mg by mouth daily as needed (Swelling).     Marland Kitchen. oxyCODONE (OXY IR/ROXICODONE) 5 MG immediate release tablet Take 1-3 tablets (5-15 mg total) by mouth  every 4 (four) hours. 80 tablet 0  . polyethylene glycol (MIRALAX /  GLYCOLAX) packet Take 17 g by mouth 2 (two) times daily. 14 each 0  . potassium chloride SA (K-DUR,KLOR-CON) 20 MEQ tablet Take 1 tablet (20 mEq total) by mouth daily. 30 tablet 6  . pregabalin (LYRICA) 75 MG capsule Take 75 mg by mouth daily.    Marland Kitchen spironolactone (ALDACTONE) 25 MG tablet Take 1 tablet (25 mg total) by mouth daily. 30 tablet 3  . torsemide (DEMADEX) 20 MG tablet Take 20 mg by mouth 2 (two) times daily.     No current facility-administered medications for this encounter.    Filed Vitals:   10/25/14 1029  BP: 110/61  Pulse: 66  Resp: 18  Weight: 239 lb 8 oz (108.636 kg)  SpO2: 96%   PHYSICAL EXAM: General:  Obese male; Ambulated into [place] with a walker,  HEENT: normal Neck: supple. JVP difficult to assess d/t body habitus but appears flat;  Carotids 2+ bilaterally; no bruits. No lymphadenopathy or thryomegaly appreciated. Cor: PMI normal. Regular rate & rhythm. No rubs, gallops or murmurs. Lungs: clear Abdomen: obese soft, nontender, mildly distended. No hepatosplenomegaly. No bruits or masses. Good bowel sounds. Extremities: no cyanosis, clubbing, rash, tr bilateral  edema.  Neuro: alert & orientedx3, cranial nerves grossly intact. Affect pleasant.   ASSESSMENT & PLAN:  1) Chronic Systolic Heart Failure: NICM, EF 5-10% (06/2014) - Reviewed discharge summary and patient admitted for ARF and orthostatic hypotension likely related to initiation of Entresto.  - NYHA II symptoms and volume status stable. Will continue torsemide 20 mg BID. Check BMET and pro-BNP today.  - Continue coreg 12.5 mg BID. Has not tolerated titration in the past d/t fatigue. - Will continue current doses of hydralazine, Imdur and Spironolactone.  - Stop potassium.  - Will not restart Entresto. Will try to restart low dose lisinopril next visit.  - He is not interested in an ICD which could help with looking at thoracic impedence and volume as well as protect him from SCD.  - Reinforced the  need and importance of daily weights, a low sodium diet and to avoid soups, and fluid restriction (less than 2 L a day). Instructed to call the [facility] if weight increases more than 3 lbs overnight or 5 lbs in a week.  2) Gout - Continue allopurinol. Continue to follow with rheumatology. Is now going to Pain management [place] for pain.  3) Atrial fibrillation-  Appears to be in NSR today. Continue BB and Eliquis. No s/s of bleeding.  4) CKD stage III - Baseline 1.9-2.4. Check BMET toda. 5) L Hip pain: - much improved with L hip surgery.   F/U 1 month Aundria Rud, NP-C 10:40 AM

## 2014-10-25 NOTE — Patient Instructions (Signed)
Doing well.  Stop potassium  Call any issues.  Follow up in 1 month.  Have a wonderful Christmas and New Years.  Do the following things EVERYDAY: 1) Weigh yourself in the morning before breakfast. Write it down and keep it in a log. 2) Take your medicines as prescribed 3) Eat low salt foods-Limit salt (sodium) to 2000 mg per day.  4) Stay as active as you can everyday 5) Limit all fluids for the day to less than 2 liters 6)

## 2014-10-26 ENCOUNTER — Other Ambulatory Visit (HOSPITAL_COMMUNITY): Payer: Self-pay | Admitting: Cardiology

## 2014-10-31 ENCOUNTER — Other Ambulatory Visit (HOSPITAL_COMMUNITY): Payer: Self-pay | Admitting: Anesthesiology

## 2014-10-31 ENCOUNTER — Encounter (HOSPITAL_COMMUNITY): Payer: Self-pay | Admitting: Internal Medicine

## 2014-11-04 ENCOUNTER — Other Ambulatory Visit (HOSPITAL_COMMUNITY): Payer: Self-pay | Admitting: Cardiology

## 2014-11-10 ENCOUNTER — Other Ambulatory Visit (HOSPITAL_COMMUNITY): Payer: Self-pay | Admitting: Adult Health

## 2014-11-10 ENCOUNTER — Other Ambulatory Visit: Payer: Self-pay | Admitting: Family Medicine

## 2014-11-11 ENCOUNTER — Encounter (HOSPITAL_COMMUNITY): Payer: Commercial Managed Care - HMO

## 2014-11-11 ENCOUNTER — Ambulatory Visit (INDEPENDENT_AMBULATORY_CARE_PROVIDER_SITE_OTHER): Payer: Commercial Managed Care - HMO

## 2014-11-11 ENCOUNTER — Ambulatory Visit (INDEPENDENT_AMBULATORY_CARE_PROVIDER_SITE_OTHER): Payer: Commercial Managed Care - HMO | Admitting: Family Medicine

## 2014-11-11 VITALS — BP 112/62 | HR 68 | Temp 98.1°F | Resp 18 | Ht 70.0 in | Wt 231.2 lb

## 2014-11-11 DIAGNOSIS — G8929 Other chronic pain: Secondary | ICD-10-CM

## 2014-11-11 DIAGNOSIS — N189 Chronic kidney disease, unspecified: Secondary | ICD-10-CM

## 2014-11-11 DIAGNOSIS — M2550 Pain in unspecified joint: Secondary | ICD-10-CM

## 2014-11-11 DIAGNOSIS — R2231 Localized swelling, mass and lump, right upper limb: Secondary | ICD-10-CM

## 2014-11-11 LAB — BASIC METABOLIC PANEL
BUN: 35 mg/dL — ABNORMAL HIGH (ref 6–23)
CHLORIDE: 101 meq/L (ref 96–112)
CO2: 26 mEq/L (ref 19–32)
Calcium: 10.1 mg/dL (ref 8.4–10.5)
Creat: 1.86 mg/dL — ABNORMAL HIGH (ref 0.50–1.35)
GLUCOSE: 66 mg/dL — AB (ref 70–99)
POTASSIUM: 4.1 meq/L (ref 3.5–5.3)
Sodium: 138 mEq/L (ref 135–145)

## 2014-11-11 NOTE — Progress Notes (Signed)
[facility] [address] [place] [address] [phone]  Date:  11/11/2014   Name:  [place]   DOB:  Apr 18, 1951   MRN:  [id number]  PCP:  Abbe Amsterdam, MD    Chief Complaint: Kidney issues; Hand Pain; and Insomnia   History of Present Illness:  [place] is a 63 y.o. very pleasant male patient who presents with the following:  He fell a couple of times over Thanksgiving- he was on some new medications which seemed to make him dizzy  He was actually admitted to the hospital overnight after he fell.   He got his left hip replaced and is very pleased with it.   When he fell he hurt his right hand and continues to have a mass on the dorsum of the hand.  He is not sure what this is and would like to have it looked at further He would also like me to check his renal function- he sees Dr. Briant Cedar for CRF.   He has not slept well due to being in pain recently,  He is a pt of [facility] and needs another referral to see them   Patient Active Problem List   Diagnosis Date Noted  . Chronic systolic congestive heart failure   . Acute renal failure superimposed on stage 3 chronic kidney disease   . Fall   . Syncope 10/19/2014  . S/P left THA, AA 09/12/2014  . Acute on chronic renal failure 08/08/2014  . Preop cardiovascular exam 08/08/2014  . Gout 04/29/2014  . Decreased pedal pulses 04/19/2014  . Acute esophagitis 12/24/2013  . Dysphagia, unspecified(787.20) 10/01/2013  . Diarrhea 10/01/2013  . Acute on chronic systolic heart failure   . Acute CHF 09/04/2013  . Chest pain 09/04/2013  . Ocular herpes   . Neuropathy   . Left-sided weakness   . Acute on chronic diastolic CHF (congestive heart failure), NYHA class 3 07/09/2013  . Acute ischemic stroke 07/09/2013  . OSA (obstructive sleep apnea) 07/09/2013  . Gout attack 07/07/2013  . Chronic systolic heart failure 07/05/2013  . Stroke 07/03/2013  . CVA (cerebral  infarction) 07/03/2013  . A-fib 07/03/2013  . Hypokalemia 07/03/2013  . Hypertension   . COPD (chronic obstructive pulmonary disease)   . Diabetes mellitus without complication   . CKD (chronic kidney disease), stage III   . CHF (congestive heart failure)   . Nonischemic cardiomyopathy   . Polysubstance abuse     Past Medical History  Diagnosis Date  . Atrial fibrillation   . Hypertension   . COPD (chronic obstructive pulmonary disease)   . Coronary artery disease   . Ocular herpes   . Gout   . CHF (congestive heart failure)     EF 15%  . Chronic pain   . Hyperlipidemia   . Nonischemic cardiomyopathy   . Ocular herpes   . Neuropathy   . Left-sided weakness     "because of the arthritis and edema"  . Acute on chronic systolic heart failure   . Decreased pedal pulses   . Memory impairment     "short term"  . CKD (chronic kidney disease), stage III     Dr. Briant Cedar follows-holding   . Fibromyalgia     neuropathy- hands, more than feet.  . Sleep apnea with use of continuous positive airway pressure (CPAP)     does not use cpap  . GERD (gastroesophageal reflux disease)     tums  as needed  . Asthma   . Type II diabetes mellitus   . Stroke X 5    last stroke aug 2014"; denies residual on 09/12/2014  . Osteoarthritis   . Arthritis     "knees, right elbow, shoulder" (09/12/2014)    Past Surgical History  Procedure Laterality Date  . 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 Tonsillectomy  age 63  . Esophagogastroduodenoscopy N/A 12/24/2013    Procedure: ESOPHAGOGASTRODUODENOSCOPY (EGD);  Surgeon: Louis Meckelobert D Kaplan, MD;  [facility];  Service: Endoscopy;  Laterality: N/A;  . Colonoscopy N/A 12/24/2013    Procedure: COLONOSCOPY;  Surgeon: Louis Meckelobert D Kaplan, MD;  Location: [facility];  Service: Endoscopy;  Laterality: N/A;  . Total hip arthroplasty Left 09/12/2014  . Joint replacement    . Cardiac catheterization  10/2006; 08/2014    normal coronary arteries;   Marland Kitchen.  Cataract extraction w/ intraocular lens  implant, bilateral Bilateral ~ 2008  . Total hip arthroplasty Left 09/12/2014    Procedure: LEFT TOTAL HIP ARTHROPLASTY ANTERIOR APPROACH;  Surgeon: Shelda PalMatthew D Olin, MD;  [facility];  Service: Orthopedics;  Laterality: Left;  . Right heart catheterization N/A 08/13/2014    Procedure: RIGHT HEART CATH;  Surgeon: Dolores Pattyaniel R Bensimhon, MD;  [facility];  Service: Cardiovascular;  Laterality: N/A;    History  Substance Use Topics  . Smoking status: Former Smoker -- 0.50 packs/day for 10 years    Types: Cigarettes    Quit date: 12/18/2006  . Smokeless tobacco: Never Used  . Alcohol Use: Yes     Comment: Quit 2008    Family History  Problem Relation Age of Onset  . Cancer Mother   . Hypertension Mother   . Lung disease Mother   . Diabetes Father   . Diabetes Sister   . Hypertension Sister   . Hypertension Brother   . Hypertension Sister   . Colon cancer Neg Hx     Allergies  Allergen Reactions  . Actos [Pioglitazone] Other (See Comments)    Caused blood in urine and fluid retention   . Januvia [Sitagliptin] Other (See Comments)    "Almost killed me"     Medication list has been reviewed and updated.  Current Outpatient Prescriptions on File Prior to Visit  Medication Sig Dispense Refill  . allopurinol (ZYLOPRIM) 100 MG tablet Take 100 mg by mouth daily with breakfast.    . apixaban (ELIQUIS) 5 MG TABS tablet Take 5 mg by mouth 2 (two) times daily.    . Ascorbic Acid (VITAMIN C PO) Take 2 tablets by mouth daily.     . baclofen (LIORESAL) 10 MG tablet Take 10 mg by mouth every 8 (eight) hours as needed for muscle spasms.    Marland Kitchen. bismuth subsalicylate (PEPTO BISMOL) 262 MG/15ML suspension Take 15 mLs by mouth every 6 (six) hours as needed for indigestion or diarrhea or loose stools.    . carvedilol (COREG) 25 MG tablet Take 12.5 mg by mouth 2 (two) times daily with a meal.     . colchicine 0.6 MG tablet Take 1.2 mg by mouth daily  as needed (Gout).    Marland Kitchen. docusate sodium 100 MG CAPS Take 100 mg by mouth 2 (two) times daily. 10 capsule 0  . ferrous sulfate 325 (65 FE) MG tablet Take 1 tablet (325 mg total) by mouth 3 (three) times daily after meals.  3  . FLECTOR 1.3 % PTCH Place 0.5 patches onto the  skin daily as needed (Pain).     Marland Kitchen glipiZIDE (GLUCOTROL) 5 MG tablet Take 5 mg by mouth 2 (two) times daily.    [place] Si Tea, Camillia sinensis, (GREEN TEA PO) Take 1 tablet by mouth daily.    . hydrALAZINE (APRESOLINE) 50 MG tablet Take 50 mg by mouth 2 (two) times daily.    . hydrALAZINE (APRESOLINE) 50 MG tablet TAKE 1 TABLET (50 MG TOTAL) BY MOUTH 2 (TWO) TIMES DAILY. 60 tablet 3  . isosorbide mononitrate (IMDUR) 60 MG 24 hr tablet TAKE 1 TABLET (60 MG TOTAL) BY MOUTH DAILY. 30 tablet 3  . Menthol-Methyl Salicylate (MUSCLE RUB) 10-15 % CREA Apply 1 application topically 3 (three) times daily as needed for muscle pain.    . metolazone (ZAROXOLYN) 5 MG tablet Take 5 mg by mouth daily as needed (Swelling).     Marland Kitchen oxyCODONE (OXY IR/ROXICODONE) 5 MG immediate release tablet Take 1-3 tablets (5-15 mg total) by mouth every 4 (four) hours. 80 tablet 0  . polyethylene glycol (MIRALAX / GLYCOLAX) packet Take 17 g by mouth 2 (two) times daily. 14 each 0  . pregabalin (LYRICA) 75 MG capsule Take 75 mg by mouth daily.    Marland Kitchen spironolactone (ALDACTONE) 25 MG tablet TAKE 1 TABLET (25 MG TOTAL) BY MOUTH DAILY. 30 tablet 3  . torsemide (DEMADEX) 20 MG tablet Take 20 mg by mouth 2 (two) times daily.     No current facility-administered medications on file prior to visit.    Review of Systems:  As per HPI- otherwise negative.   Physical Examination: Filed Vitals:   11/11/14 1442  BP: 112/62  Pulse: 68  Temp: 98.1 F (36.7 C)  Resp: 18   Filed Vitals:   11/11/14 1442  Height: 5\' 10"  (1.778 m)  Weight: 231 lb 3.2 oz (104.872 kg)   Body mass index is 33.17 kg/(m^2). Ideal Body Weight: Weight in (lb) to have BMI = 25: 173.9  GEN:  WDWN, NAD, Non-toxic, A & O x 3, obese, looks well HEENT: Atraumatic, Normocephalic. Neck supple. No masses, No LAD. Ears and Nose: No external deformity. CV: RRR, No M/G/R. No JVD. No thrill. No extra heart sounds. PULM: CTA B, no wheezes, crackles, rhonchi. No retractions. No resp. distress. No accessory muscle use. ABD: S, NT, ND, +BS. No rebound. No HSM. EXTR: No c/c/e NEURO Normal gait for pt uses a cane PSYCH: Normally interactive. Conversant. Not depressed or anxious appearing.  Calm demeanor.  Right wrist: there is a soft swelling- perhaps a cyst- on the dorsal wrist approx 2.5 x 1.5 cm in size.  Non to minimally tender, normal strength of the hand and ROM of the wrist  [facility] reading (PRIMARY) by  Dr. Synetta Fail. Right wrist: negative   CLINICAL DATA: Right wrist pain status post fall  EXAM: RIGHT WRIST - COMPLETE 3+ VIEW  COMPARISON: Right wrist series of October 19, 2014  FINDINGS: The bones of the right wrist are adequately mineralized. There is no acute fracture nor dislocation. The scaphoid is intact. The radiocarpal and ulnocarpal joint spaces are preserved as are the intercarpal joints and the carpometacarpal joints. Mild soft tissue swelling over the dorsum of the wrist is demonstrated.  IMPRESSION: There is no acute bony abnormality of the right wrist.     Assessment and Plan: Mass of right wrist - Plan: DG Wrist Complete Right, [place] Misc Soft Tissue  Chronic kidney failure, unspecified stage - Plan: Basic metabolic panel  Chronic joint pain - Plan:  Ambulatory referral to Pain Clinic  Will refer for an ultrasound to further evaluate soft mass on wrist. May be just a ganglion cyst Check BMP per his request Did a referral to his pain clinic for insurance reasons Signed Abbe Amsterdam, MD  He is a pt of [facility]

## 2014-11-11 NOTE — Patient Instructions (Signed)
I will be in touch with your labs to check your kidney function.  If there is any concern I will send this to your nephrologist for you Your wrist x-rays look fine.  Let's send you for an ultrasound of your wrist to see if we can determine the cause of your swelling.

## 2014-11-12 ENCOUNTER — Encounter: Payer: Self-pay | Admitting: Family Medicine

## 2014-11-12 ENCOUNTER — Other Ambulatory Visit: Payer: Self-pay | Admitting: Radiology

## 2014-11-12 DIAGNOSIS — R2232 Localized swelling, mass and lump, left upper limb: Secondary | ICD-10-CM

## 2014-11-14 ENCOUNTER — Encounter (HOSPITAL_COMMUNITY): Payer: Self-pay

## 2014-11-14 ENCOUNTER — Emergency Department (HOSPITAL_COMMUNITY)
Admission: EM | Admit: 2014-11-14 | Discharge: 2014-11-14 | Disposition: A | Discharge status: Home / Self Care | Payer: Commercial Managed Care - HMO | Attending: Emergency Medicine | Admitting: Emergency Medicine

## 2014-11-14 ENCOUNTER — Emergency Department (HOSPITAL_COMMUNITY): Payer: Commercial Managed Care - HMO

## 2014-11-14 DIAGNOSIS — Z7902 Long term (current) use of antithrombotics/antiplatelets: Secondary | ICD-10-CM | POA: Diagnosis not present

## 2014-11-14 DIAGNOSIS — Z8673 Personal history of transient ischemic attack (TIA), and cerebral infarction without residual deficits: Secondary | ICD-10-CM | POA: Diagnosis not present

## 2014-11-14 DIAGNOSIS — I2510 Atherosclerotic heart disease of native coronary artery without angina pectoris: Secondary | ICD-10-CM | POA: Diagnosis not present

## 2014-11-14 DIAGNOSIS — Z87891 Personal history of nicotine dependence: Secondary | ICD-10-CM | POA: Diagnosis not present

## 2014-11-14 DIAGNOSIS — G8929 Other chronic pain: Secondary | ICD-10-CM | POA: Insufficient documentation

## 2014-11-14 DIAGNOSIS — I5023 Acute on chronic systolic (congestive) heart failure: Secondary | ICD-10-CM | POA: Insufficient documentation

## 2014-11-14 DIAGNOSIS — Z79899 Other long term (current) drug therapy: Secondary | ICD-10-CM | POA: Insufficient documentation

## 2014-11-14 DIAGNOSIS — R109 Unspecified abdominal pain: Secondary | ICD-10-CM

## 2014-11-14 DIAGNOSIS — E785 Hyperlipidemia, unspecified: Secondary | ICD-10-CM | POA: Diagnosis not present

## 2014-11-14 DIAGNOSIS — K5730 Diverticulosis of large intestine without perforation or abscess without bleeding: Secondary | ICD-10-CM | POA: Diagnosis not present

## 2014-11-14 DIAGNOSIS — I4891 Unspecified atrial fibrillation: Secondary | ICD-10-CM | POA: Insufficient documentation

## 2014-11-14 DIAGNOSIS — I129 Hypertensive chronic kidney disease with stage 1 through stage 4 chronic kidney disease, or unspecified chronic kidney disease: Secondary | ICD-10-CM | POA: Diagnosis not present

## 2014-11-14 DIAGNOSIS — N183 Chronic kidney disease, stage 3 (moderate): Secondary | ICD-10-CM | POA: Diagnosis not present

## 2014-11-14 DIAGNOSIS — J449 Chronic obstructive pulmonary disease, unspecified: Secondary | ICD-10-CM | POA: Insufficient documentation

## 2014-11-14 DIAGNOSIS — K219 Gastro-esophageal reflux disease without esophagitis: Secondary | ICD-10-CM | POA: Insufficient documentation

## 2014-11-14 DIAGNOSIS — Z8739 Personal history of other diseases of the musculoskeletal system and connective tissue: Secondary | ICD-10-CM | POA: Diagnosis not present

## 2014-11-14 LAB — COMPREHENSIVE METABOLIC PANEL
ALT: 16 U/L (ref 0–53)
ANION GAP: 9 (ref 5–15)
AST: 27 U/L (ref 0–37)
Albumin: 4.1 g/dL (ref 3.5–5.2)
Alkaline Phosphatase: 124 U/L — ABNORMAL HIGH (ref 39–117)
BUN: 28 mg/dL — ABNORMAL HIGH (ref 6–23)
CALCIUM: 9.4 mg/dL (ref 8.4–10.5)
CO2: 26 mmol/L (ref 19–32)
Chloride: 99 mEq/L (ref 96–112)
Creatinine, Ser: 1.61 mg/dL — ABNORMAL HIGH (ref 0.50–1.35)
GFR calc Af Amer: 51 mL/min — ABNORMAL LOW (ref >90)
GFR, EST NON AFRICAN AMERICAN: 44 mL/min — AB (ref >90)
GLUCOSE: 150 mg/dL — AB (ref 70–99)
Potassium: 3.4 mmol/L — ABNORMAL LOW (ref 3.5–5.1)
Sodium: 134 mmol/L — ABNORMAL LOW (ref 135–145)
Total Bilirubin: 0.3 mg/dL (ref 0.3–1.2)
Total Protein: 7.3 g/dL (ref 6.0–8.3)

## 2014-11-14 LAB — CBC WITH DIFFERENTIAL/PLATELET
BASOS ABS: 0 10*3/uL (ref 0.0–0.1)
Basophils Relative: 0 % (ref 0–1)
EOS PCT: 0 % (ref 0–5)
Eosinophils Absolute: 0 10*3/uL (ref 0.0–0.7)
HCT: 34.3 % — ABNORMAL LOW (ref 39.0–52.0)
HEMOGLOBIN: 10.9 g/dL — AB (ref 13.0–17.0)
LYMPHS PCT: 13 % (ref 12–46)
Lymphs Abs: 1.2 10*3/uL (ref 0.7–4.0)
MCH: 26.8 pg (ref 26.0–34.0)
MCHC: 31.8 g/dL (ref 30.0–36.0)
MCV: 84.5 fL (ref 78.0–100.0)
MONO ABS: 0.5 10*3/uL (ref 0.1–1.0)
MONOS PCT: 6 % (ref 3–12)
Neutro Abs: 7.7 10*3/uL (ref 1.7–7.7)
Neutrophils Relative %: 81 % — ABNORMAL HIGH (ref 43–77)
Platelets: 230 10*3/uL (ref 150–400)
RBC: 4.06 MIL/uL — ABNORMAL LOW (ref 4.22–5.81)
RDW: 14.9 % (ref 11.5–15.5)
WBC: 9.5 10*3/uL (ref 4.0–10.5)

## 2014-11-14 LAB — URINALYSIS, ROUTINE W REFLEX MICROSCOPIC
BILIRUBIN URINE: NEGATIVE
GLUCOSE, UA: NEGATIVE mg/dL
Hgb urine dipstick: NEGATIVE
KETONES UR: NEGATIVE mg/dL
LEUKOCYTES UA: NEGATIVE
Nitrite: NEGATIVE
Protein, ur: NEGATIVE mg/dL
Specific Gravity, Urine: 1.015 (ref 1.005–1.030)
Urobilinogen, UA: 0.2 mg/dL (ref 0.0–1.0)
pH: 5 (ref 5.0–8.0)

## 2014-11-14 LAB — LIPASE, BLOOD: Lipase: 27 U/L (ref 11–59)

## 2014-11-14 MED ORDER — SODIUM CHLORIDE 0.9 % IV BOLUS (SEPSIS)
1000.0000 mL | Freq: Once | INTRAVENOUS | Status: AC
  Administered 2014-11-14: 1000 mL via INTRAVENOUS

## 2014-11-14 MED ORDER — METOCLOPRAMIDE HCL 5 MG/ML IJ SOLN
10.0000 mg | Freq: Once | INTRAMUSCULAR | Status: AC
  Administered 2014-11-14: 10 mg via INTRAVENOUS
  Filled 2014-11-14: qty 2

## 2014-11-14 MED ORDER — HYDROMORPHONE HCL 1 MG/ML IJ SOLN
1.0000 mg | Freq: Once | INTRAMUSCULAR | Status: AC
  Administered 2014-11-14: 1 mg via INTRAVENOUS
  Filled 2014-11-14: qty 1

## 2014-11-14 MED ORDER — ONDANSETRON HCL 4 MG/2ML IJ SOLN
4.0000 mg | Freq: Once | INTRAMUSCULAR | Status: AC
  Administered 2014-11-14: 4 mg via INTRAVENOUS
  Filled 2014-11-14: qty 2

## 2014-11-14 MED ORDER — IOHEXOL 300 MG/ML  SOLN
100.0000 mL | Freq: Once | INTRAMUSCULAR | Status: AC | PRN
  Administered 2014-11-14: 100 mL via INTRAVENOUS

## 2014-11-14 MED ORDER — MORPHINE SULFATE 4 MG/ML IJ SOLN
6.0000 mg | Freq: Once | INTRAMUSCULAR | Status: AC
  Administered 2014-11-14: 6 mg via INTRAVENOUS
  Filled 2014-11-14: qty 2

## 2014-11-14 MED ORDER — HYDROCODONE-ACETAMINOPHEN 5-325 MG PO TABS: 1.0000 | ORAL_TABLET | Freq: Two times a day (BID) | ORAL | Status: DC | PRN

## 2014-11-14 MED ORDER — ONDANSETRON 4 MG PO TBDP: 4.0000 mg | ORAL_TABLET | Freq: Three times a day (TID) | ORAL | Status: DC | PRN

## 2014-11-14 NOTE — ED Notes (Signed)
Pt moved to B15.  Nurse aware and pt getting changed in gown.

## 2014-11-14 NOTE — ED Notes (Signed)
Pt states late yesterday afternoon he begin n/v with abdominal cramping lower mid section; pt states he took med with no relief; pt states new onset; pt states he has been sweating but unsure if he has a temp.

## 2014-11-14 NOTE — ED Notes (Signed)
Patient transported to CT 

## 2014-11-14 NOTE — ED Provider Notes (Signed)
CSN: [id number]     Arrival date & time 11/14/14  4098 History  This chart was scribed for Tomasita Crumble, MD by Evon Slack, ED Scribe. This patient was seen in room B15C/B15C and the patient's care was started at 3:47 AM.    Chief Complaint  Patient presents with  . Emesis  . Abdominal Pain   Patient is a 63 y.o. male presenting with vomiting and abdominal pain. The history is provided by the patient. No language interpreter was used.  Emesis Associated symptoms: abdominal pain   Associated symptoms: no diarrhea   Abdominal Pain Associated symptoms: nausea and vomiting   Associated symptoms: no diarrhea    HPI Comments: Frank Davidson is a 63 y.o. male who presents to the Emergency Department complaining of constant severe sharp cramping abdominal pain onset today at 5 Pm. He states he has associated nausea, vomiting x10-12 and diaphoresis. Pt states that vomiting makes his pain worse. Denies eating. Pt states that he was recently at urgent where he may have been in contact with possible sick contacts. Pt states that he had similar pain a few years ago and was diagnosed with stomach virus. Denies diarrhea or other related symptoms.   Past Medical History  Diagnosis Date  . Atrial fibrillation   . Hypertension   . COPD (chronic obstructive pulmonary disease)   . Coronary artery disease   . Ocular herpes   . Gout   . CHF (congestive heart failure)     EF 15%  . Chronic pain   . Hyperlipidemia   . Nonischemic cardiomyopathy   . Ocular herpes   . Neuropathy   . Left-sided weakness     "because of the arthritis and edema"  . Acute on chronic systolic heart failure   . Decreased pedal pulses   . Memory impairment     "short term"  . CKD (chronic kidney disease), stage III     Dr. Briant Cedar follows-holding   . Fibromyalgia     neuropathy- hands, more than feet.  . Sleep apnea with use of continuous positive airway pressure (CPAP)     does not use cpap  . GERD  (gastroesophageal reflux disease)     tums as needed  . Asthma   . Type II diabetes mellitus   . Stroke X 5    last stroke aug 2014"; denies residual on 09/12/2014  . Osteoarthritis   . Arthritis     "knees, right elbow, shoulder" (09/12/2014)   Past Surgical History  Procedure Laterality Date  . 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 Tonsillectomy  age 73  . Esophagogastroduodenoscopy N/A 12/24/2013    Procedure: ESOPHAGOGASTRODUODENOSCOPY (EGD);  Surgeon: Louis Meckel, MD;  Location: [facility];  Service: Endoscopy;  Laterality: N/A;  . Colonoscopy N/A 12/24/2013    Procedure: COLONOSCOPY;  Surgeon: Louis Meckel, MD;  Location: [facility];  Service: Endoscopy;  Laterality: N/A;  . Total hip arthroplasty Left 09/12/2014  . Joint replacement    . Cardiac catheterization  10/2006; 08/2014    normal coronary arteries;   Marland Kitchen Cataract extraction w/ intraocular lens  implant, bilateral Bilateral ~ 2008  . Total hip arthroplasty Left 09/12/2014    Procedure: LEFT TOTAL HIP ARTHROPLASTY ANTERIOR APPROACH;  Surgeon: [name], MD;  Location: [facility] OR;  Service: Orthopedics;  Laterality: Left;  . Right heart catheterization N/A 08/13/2014    Procedure: RIGHT HEART CATH;  Surgeon: Dolores Patty, MD;  Location:  [facility] CATH LAB;  Service: Cardiovascular;  Laterality: N/A;   Family History  Problem Relation Age of Onset  . Cancer Mother   . Hypertension Mother   . Lung disease Mother   . Diabetes Father   . Diabetes Sister   . Hypertension Sister   . Hypertension Brother   . Hypertension Sister   . Colon cancer Neg Hx    History  Substance Use Topics  . Smoking status: Former Smoker -- 0.50 packs/day for 10 years    Types: Cigarettes    Quit date: 12/18/2006  . Smokeless tobacco: Never Used  . Alcohol Use: No     Comment: Quit 2008    Review of Systems  Constitutional: Positive for diaphoresis.  Gastrointestinal: Positive for nausea, vomiting and abdominal pain. Negative for  diarrhea.  All other systems reviewed and are negative.   Allergies  Corlanor; Januvia; Lunesta; and Actos  Home Medications   Prior to Admission medications   Medication Sig Start Date End Date Taking? Authorizing Provider  allopurinol (ZYLOPRIM) 100 MG tablet Take 100 mg by mouth daily with breakfast. 11/01/13  Yes Baker Pierini, FNP  apixaban (ELIQUIS) 5 MG TABS tablet Take 5 mg by mouth 2 (two) times daily. 11/01/13  Yes Baker Pierini, FNP  Ascorbic Acid (VITAMIN C PO) Take 2 tablets by mouth daily.    Yes Historical Provider, MD  baclofen (LIORESAL) 10 MG tablet Take 10 mg by mouth every 8 (eight) hours as needed for muscle spasms.   Yes Historical Provider, MD  bismuth subsalicylate (PEPTO BISMOL) 262 MG/15ML suspension Take 15 mLs by mouth every 6 (six) hours as needed for indigestion or diarrhea or loose stools.   Yes Historical Provider, MD  carvedilol (COREG) 25 MG tablet Take 12.5 mg by mouth 2 (two) times daily with a meal.    Yes Historical Provider, MD  docusate sodium 100 MG CAPS Take 100 mg by mouth 2 (two) times daily. 09/16/14  Yes Genelle Gather Babish, PA-C  ferrous sulfate 325 (65 FE) MG tablet Take 1 tablet (325 mg total) by mouth 3 (three) times daily after meals. 09/16/14  Yes Genelle Gather Babish, PA-C  FLECTOR 1.3 % PTCH Place 0.5 patches onto the skin daily as needed (Pain).  03/21/14  Yes Historical Provider, MD  glipiZIDE (GLUCOTROL) 5 MG tablet Take 5 mg by mouth 2 (two) times daily.   Yes Historical Provider, MD  Chilton Si Tea, Camillia sinensis, (GREEN TEA PO) Take 1 tablet by mouth daily.   Yes Historical Provider, MD  Asencion Gowda 550 MG CAPS Take 1,100 mg by mouth 2 (two) times daily.   Yes Historical Provider, MD  hydrALAZINE (APRESOLINE) 50 MG tablet TAKE 1 TABLET (50 MG TOTAL) BY MOUTH 2 (TWO) TIMES DAILY. 11/04/14  Yes Dolores Patty, MD  isosorbide mononitrate (IMDUR) 60 MG 24 hr tablet TAKE 1 TABLET (60 MG TOTAL) BY MOUTH DAILY. 10/29/14  Yes  Dolores Patty, MD  metolazone (ZAROXOLYN) 5 MG tablet Take 5 mg by mouth daily as needed (Swelling).  05/13/14  Yes Amy D Clegg, NP  Misc Natural Products (TURMERIC CURCUMIN) CAPS Take 1 capsule by mouth 3 (three) times daily.   Yes Historical Provider, MD  oxyCODONE (OXY IR/ROXICODONE) 5 MG immediate release tablet Take 1-3 tablets (5-15 mg total) by mouth every 4 (four) hours. Patient taking differently: Take 5-15 mg by mouth every 4 (four) hours as needed for moderate pain.  09/16/14  Yes [name], MD  polyethylene glycol (MIRALAX / GLYCOLAX) packet Take 17 g by mouth 2 (two) times daily. 09/16/14  Yes Genelle Gather Babish, PA-C  pregabalin (LYRICA) 75 MG capsule Take 75 mg by mouth daily.   Yes Historical Provider, MD  spironolactone (ALDACTONE) 25 MG tablet TAKE 1 TABLET (25 MG TOTAL) BY MOUTH DAILY. 10/31/14  Yes Dolores Patty, MD  torsemide (DEMADEX) 20 MG tablet Take 20 mg by mouth 2 (two) times daily.   Yes Historical Provider, MD  colchicine 0.6 MG tablet Take 1.2 mg by mouth daily as needed (Gout).    Historical Provider, MD  KLOR-CON M20 20 MEQ tablet TAKE 1 TABLET (20 MEQ TOTAL) BY MOUTH DAILY. 11/12/14   Pearline Cables, MD   Triage Vitals: BP 142/67 mmHg  Pulse 80  Temp(Src) 98 F (36.7 C) (Oral)  Resp 20  Ht 5\' 10"  (1.778 m)  Wt 238 lb (107.956 kg)  BMI 34.15 kg/m2  SpO2 98%   Physical Exam  Constitutional: He is oriented to person, place, and time. Vital signs are normal. He appears well-developed and well-nourished.  Non-toxic appearance. He does not appear ill. No distress.  HENT:  Head: Normocephalic and atraumatic.  Nose: Nose normal.  Mouth/Throat: Oropharynx is clear and moist. No oropharyngeal exudate.  Eyes: Conjunctivae and EOM are normal. Pupils are equal, round, and reactive to light. No scleral icterus.  Neck: Normal range of motion. Neck supple. No tracheal deviation, no edema, no erythema and normal range of motion present. No thyroid mass  and no thyromegaly present.  Cardiovascular: Normal rate, regular rhythm, S1 normal, S2 normal, normal heart sounds, intact distal pulses and normal pulses.  Exam reveals no gallop and no friction rub.   No murmur heard. Pulses:      Radial pulses are 2+ on the right side, and 2+ on the left side.       Dorsalis pedis pulses are 2+ on the right side, and 2+ on the left side.  Pulmonary/Chest: Effort normal and breath sounds normal. No respiratory distress. He has no wheezes. He has no rhonchi. He has no rales.  Abdominal: Soft. Normal appearance and bowel sounds are normal. He exhibits no distension, no ascites and no mass. There is no hepatosplenomegaly. There is no tenderness. There is no rebound, no guarding and no CVA tenderness.  Musculoskeletal: Normal range of motion. He exhibits no edema or tenderness.  Lymphadenopathy:    He has no cervical adenopathy.  Neurological: He is alert and oriented to person, place, and time. He has normal strength. No cranial nerve deficit or sensory deficit. He exhibits normal muscle tone. GCS eye subscore is 4. GCS verbal subscore is 5. GCS motor subscore is 6.  Skin: Skin is warm, dry and intact. No petechiae and no rash noted. He is not diaphoretic. No erythema. No pallor.  Psychiatric: He has a normal mood and affect. His behavior is normal. Judgment normal.  Nursing note and vitals reviewed.   ED Course  Procedures (including critical care time) DIAGNOSTIC STUDIES: Oxygen Saturation is 98% on RA, normal by my interpretation.    COORDINATION OF CARE: 4:00 AM-Discussed treatment plan  with pt at bedside and pt agreed to plan.     Labs Review Labs Reviewed  CBC WITH DIFFERENTIAL - Abnormal; Notable for the following:    RBC 4.06 (*)    Hemoglobin 10.9 (*)    HCT 34.3 (*)    Neutrophils Relative % 81 (*)    All other components within  normal limits  COMPREHENSIVE METABOLIC PANEL - Abnormal; Notable for the following:    Sodium 134 (*)     Potassium 3.4 (*)    Glucose, Bld 150 (*)    BUN 28 (*)    Creatinine, Ser 1.61 (*)    Alkaline Phosphatase 124 (*)    GFR calc non Af Amer 44 (*)    GFR calc Af Amer 51 (*)    All other components within normal limits  LIPASE, BLOOD  URINALYSIS, ROUTINE W REFLEX MICROSCOPIC    Imaging Review Ct Abdomen Pelvis W Contrast  11/14/2014   CLINICAL DATA:  Acute onset of nausea, vomiting and abdominal cramping. Initial encounter.  EXAM: CT ABDOMEN AND PELVIS WITH CONTRAST  TECHNIQUE: Multidetector CT imaging of the abdomen and pelvis was performed using the standard protocol following bolus administration of intravenous contrast.  CONTRAST:  OMNIPAQUE IOHEXOL 300 MG/ML  SOLN  COMPARISON:  CT of the abdomen and pelvis performed 09/29/2013  FINDINGS: Mild bibasilar atelectasis is noted.  The liver and spleen are unremarkable in appearance. The gallbladder is within normal limits. The pancreas and adrenal glands are unremarkable.  Nonspecific perinephric stranding is noted bilaterally. Mild left renal scarring is noted. A nonobstructing 3 mm stone is noted at the interpole region of the left kidney. The kidneys are otherwise unremarkable. There is no evidence of hydronephrosis. No obstructing ureteral stones are seen.  No free fluid is identified. The small bowel is unremarkable in appearance. The stomach is within normal limits. No acute vascular abnormalities are seen. Scattered calcification is seen along the abdominal aorta and its branches.  The appendix is normal in caliber, without evidence for appendicitis. Scattered diverticulosis is noted along the distal descending and proximal sigmoid colon, without evidence of diverticulitis.  The bladder is mildly distended and grossly unremarkable. The prostate remains normal in size. A mildly enlarged left inguinal node is seen, measuring 1.2 cm in short axis.  No acute osseous abnormalities are identified. The patient's left hip arthroplasty is  incompletely imaged but appears grossly unremarkable, without evidence of loosening. The screw of the acetabular component extends into the left iliacus muscle. Multilevel vacuum phenomenon is noted along the lumbar spine, with associated endplate sclerotic change and endplate irregularity.  IMPRESSION: 1. No acute abnormality seen in the abdomen or pelvis. 2. Scattered diverticulosis along the distal descending and proximal sigmoid colon, without evidence of diverticulitis. 3. Mild left renal scarring noted. Nonobstructing 3 mm stone at the interpole region of the left kidney. 4. Scattered calcification along the abdominal aorta and its branches. 5. Mildly enlarged left inguinal node, measuring 1.2 cm in short axis. This is of uncertain significance. 6. Mild bibasilar atelectasis noted. 7. Incidental note of the screw of the acetabular component of the patient's left hip arthroplasty extending into the left iliacus muscle. The left hip arthroplasty is otherwise grossly unremarkable.   Electronically Signed   By: Roanna Raider M.D.   On: 11/14/2014 06:10     EKG Interpretation None      MDM   Final diagnoses:  None      I personally performed the services described in this documentation, which was scribed in my presence. The recorded information has been reviewed and is accurate. Patient presents emergency department out of concern for abdominal pain. He states he has significant pain however his physical exam is unremarkable for any tenderness.  He was given morphine and dilaudid for pain control.  Zorfan and reglan given for nausea.  Upon repeat evaluation the patient symptoms have improved.  CT scan of abdomen shows diverticulosis and nonobstructing kidney stone.    He was advised on dietary changes.  Plan to DC home with pain/nausea control.  His vital signs remain within his normal limits and he is safe for Dc.    Tomasita CrumbleAdeleke Chelsia Serres, MD 11/14/14 [phone]

## 2014-11-14 NOTE — Discharge Instructions (Signed)
Diverticulosis Frank Davidson, you were seen today for abdominal pain.  Your CT results are below.  Use dietary changes below and follow up with your primary physician for continued management.  If symptoms worsen, come back to the ED immediately for repeat evaluation. Thank you. Diverticulosis is the condition that develops when small pouches (diverticula) form in the wall of your colon. Your colon, or large intestine, is where water is absorbed and stool is formed. The pouches form when the inside layer of your colon pushes through weak spots in the outer layers of your colon. CAUSES  No one knows exactly what causes diverticulosis. RISK FACTORS  Being older than 50. Your risk for this condition increases with age. Diverticulosis is rare in people younger than 40 years. By age 100, almost everyone has it.  Eating a low-fiber diet.  Being frequently constipated.  Being overweight.  Not getting enough exercise.  Smoking.  Taking over-the-counter pain medicines, like aspirin and ibuprofen. SYMPTOMS  Most people with diverticulosis do not have symptoms. DIAGNOSIS  Because diverticulosis often has no symptoms, health care providers often discover the condition during an exam for other colon problems. In many cases, a health care provider will diagnose diverticulosis while using a flexible scope to examine the colon (colonoscopy). TREATMENT  If you have never developed an infection related to diverticulosis, you may not need treatment. If you have had an infection before, treatment may include:  Eating more fruits, vegetables, and grains.  Taking a fiber supplement.  Taking a live bacteria supplement (probiotic).  Taking medicine to relax your colon. HOME CARE INSTRUCTIONS   Drink at least 6-8 glasses of water each day to prevent constipation.  Try not to strain when you have a bowel movement.  Keep all follow-up appointments. If you have had an infection before:  Increase the  fiber in your diet as directed by your health care provider or dietitian.  Take a dietary fiber supplement if your health care provider approves.  Only take medicines as directed by your health care provider. SEEK MEDICAL CARE IF:   You have abdominal pain.  You have bloating.  You have cramps.  You have not gone to the bathroom in 3 days. SEEK IMMEDIATE MEDICAL CARE IF:   Your pain gets worse.  Yourbloating becomes very bad.  You have a fever or chills, and your symptoms suddenly get worse.  You begin vomiting.  You have bowel movements that are bloody or black. MAKE SURE YOU:  Understand these instructions.  Will watch your condition.  Will get help right away if you are not doing well or get worse. Document Released: 08/05/2004 Document Revised: 11/13/2013 Document Reviewed: 10/03/2013 [facility] Patient Information 2015 [place], [place]. This information is not intended to replace advice given to you by your health care provider. Make sure you discuss any questions you have with your health care provider.

## 2014-11-16 ENCOUNTER — Ambulatory Visit (INDEPENDENT_AMBULATORY_CARE_PROVIDER_SITE_OTHER): Payer: Commercial Managed Care - HMO | Admitting: Family Medicine

## 2014-11-16 ENCOUNTER — Ambulatory Visit (INDEPENDENT_AMBULATORY_CARE_PROVIDER_SITE_OTHER): Payer: Commercial Managed Care - HMO

## 2014-11-16 VITALS — BP 138/82 | HR 108 | Temp 98.3°F | Resp 18 | Ht 70.0 in | Wt 236.0 lb

## 2014-11-16 DIAGNOSIS — M25551 Pain in right hip: Secondary | ICD-10-CM

## 2014-11-16 DIAGNOSIS — K5732 Diverticulitis of large intestine without perforation or abscess without bleeding: Secondary | ICD-10-CM

## 2014-11-16 DIAGNOSIS — R062 Wheezing: Secondary | ICD-10-CM

## 2014-11-16 LAB — POCT UA - MICROSCOPIC ONLY
Casts, Ur, LPF, POC: NEGATIVE
Crystals, Ur, HPF, POC: NEGATIVE
Mucus, UA: NEGATIVE
Yeast, UA: NEGATIVE

## 2014-11-16 LAB — COMPREHENSIVE METABOLIC PANEL
ALT: 20 U/L (ref 0–53)
AST: 25 U/L (ref 0–37)
Albumin: 4.1 g/dL (ref 3.5–5.2)
Alkaline Phosphatase: 115 U/L (ref 39–117)
BUN: 15 mg/dL (ref 6–23)
CO2: 32 mEq/L (ref 19–32)
Calcium: 9.5 mg/dL (ref 8.4–10.5)
Chloride: 96 mEq/L (ref 96–112)
Creat: 1.45 mg/dL — ABNORMAL HIGH (ref 0.50–1.35)
Glucose, Bld: 102 mg/dL — ABNORMAL HIGH (ref 70–99)
Potassium: 3.4 mEq/L — ABNORMAL LOW (ref 3.5–5.3)
Sodium: 137 mEq/L (ref 135–145)
Total Bilirubin: 0.5 mg/dL (ref 0.2–1.2)
Total Protein: 6.9 g/dL (ref 6.0–8.3)

## 2014-11-16 LAB — POCT CBC
Granulocyte percent: 66.4 %G (ref 37–80)
HCT, POC: 36.4 % — AB (ref 43.5–53.7)
Hemoglobin: 11.6 g/dL — AB (ref 14.1–18.1)
Lymph, poc: 2.4 (ref 0.6–3.4)
MCH, POC: 28.3 pg (ref 27–31.2)
MCHC: 31.9 g/dL (ref 31.8–35.4)
MCV: 88.5 fL (ref 80–97)
MID (cbc): 0.5 (ref 0–0.9)
MPV: 7.3 fL (ref 0–99.8)
POC Granulocyte: 5.7 (ref 2–6.9)
POC LYMPH PERCENT: 27.9 %L (ref 10–50)
POC MID %: 5.7 %M (ref 0–12)
Platelet Count, POC: 240 10*3/uL (ref 142–424)
RBC: 4.11 M/uL — AB (ref 4.69–6.13)
RDW, POC: 16.9 %
WBC: 8.6 10*3/uL (ref 4.6–10.2)

## 2014-11-16 LAB — POCT URINALYSIS DIPSTICK
Blood, UA: NEGATIVE
Glucose, UA: NEGATIVE
Leukocytes, UA: NEGATIVE
Nitrite, UA: NEGATIVE
Protein, UA: 100
Spec Grav, UA: 1.015
Urobilinogen, UA: 1
pH, UA: 6

## 2014-11-16 LAB — POCT SEDIMENTATION RATE: POCT SED RATE: 35 mm/hr — AB (ref 0–22)

## 2014-11-16 LAB — BRAIN NATRIURETIC PEPTIDE: Brain Natriuretic Peptide: 866.4 pg/mL — ABNORMAL HIGH (ref 0.0–100.0)

## 2014-11-16 MED ORDER — MELOXICAM 7.5 MG PO TABS: 7.5000 mg | ORAL_TABLET | Freq: Every day | ORAL | Status: DC

## 2014-11-16 MED ORDER — CIPROFLOXACIN HCL 500 MG PO TABS: 500.0000 mg | ORAL_TABLET | Freq: Two times a day (BID) | ORAL | Status: DC

## 2014-11-16 MED ORDER — POLYETHYLENE GLYCOL 3350 17 GM/SCOOP PO POWD: 17.0000 g | Freq: Two times a day (BID) | ORAL | Status: DC | PRN

## 2014-11-16 NOTE — Progress Notes (Signed)
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  Patient's past medical history is noted on his problem list. In addition he has a left hip replacement and an unstable gait using a cane for support.  Patient presents with several days of vomiting. He was seen for follow-up of CHF and adverse reaction to some medicine last Tuesday, the day his vomiting began. Nevertheless the vomiting was not addressed until the day before Christmas when he presented to the emergency room and was told he had diverticulosis. He was given pain medicine and told to follow-up with his primary care doctor.  Patient's continues to have nausea and vomiting, partially relieved by Zofran prescribed on the day before Christmas. He continues to have abdominal pain, primarily in the left lower quadrant and flank.  Patient also complains about right hip pain and right lateral knee pain. He believes he has bursitis. It is most other someone he lies down and lays on his side. He also has some tenderness on the left lateral knee when he lies on that side.  Objective: Patient's in no acute distress. He's alert and articulate HEENT: Unremarkable Neck: Supple no adenopathy Chest: Expiratory wheezes Heart: Regular, S4 gallop, no murmur Abdomen: Protuberant, no HSM, no masses, tender left lower quadrant and suprapubic areas. He has no guarding or rebound Extremities: Good pedal pulses, no edema, tender right trochanter and right knee joint line.   [facility] reading (PRIMARY) by  Dr. Milus GlazierLauenstein:  . Acute abdominal series:  Cardiomegaly without CHF, nonspecific gas pattern in abdomen and some stool in distal colon Left hip:  Mild arthritic changes  Results for orders placed or performed in visit on 11/16/14  POCT CBC  Result Value Ref Range   WBC 8.6 4.6 - 10.2 K/uL   Lymph, poc 2.4 0.6 - 3.4   POC  LYMPH PERCENT 27.9 10 - 50 %L   MID (cbc) 0.5 0 - 0.9   POC MID % 5.7 0 - 12 %M   POC Granulocyte 5.7 2 - 6.9   Granulocyte percent 66.4 37 - 80 %G   RBC 4.11 (A) 4.69 - 6.13 M/uL   Hemoglobin 11.6 (A) 14.1 - 18.1 g/dL   HCT, POC 82.936.4 (A) 56.243.5 - 53.7 %   MCV 88.5 80 - 97 fL   MCH, POC 28.3 27 - 31.2 pg   MCHC 31.9 31.8 - 35.4 g/dL   RDW, POC 13.016.9 %   Platelet Count, POC 240 142 - 424 K/uL   MPV 7.3 0 - 99.8 fL     Assessment: Patient does not appear to be acutely ill. He says that his main problem is his hip and right lower extremity pain. He does not seem very concerned with the fact that he's had intermittent vomiting for the last 3-4 days.  Nevertheless, he does have tenderness in his left lower quadrant and I'm reluctant to give him anything stronger than what he already takes for the pain in his hip because of the suggestion of a subacute diverticulitis.  He does have expiratory wheezes but this is with rather forced expiration and I don't hear any rales. We'll check his BNP because of his history of CHF.  Plan: Start antibiotics with follow-up in 48 hours. Meloxicam for the hip pain  This chart was scribed in my presence and reviewed by me personally.    ICD-9-CM ICD-10-CM  1. Diverticulitis of colon 562.11 K57.32 POCT CBC     POCT UA - Microscopic Only     POCT urinalysis dipstick     DG Abd Acute W/Chest     ciprofloxacin (CIPRO) 500 MG tablet  2. Wheezing 786.07 R06.2 Comprehensive metabolic panel     Brain natriuretic peptide  3. Hip pain, acute, right 719.45 M25.551 POCT SEDIMENTATION RATE     DG Hip Complete Right     meloxicam (MOBIC) 7.5 MG tablet    Signed, [name], MD

## 2014-11-16 NOTE — Patient Instructions (Addendum)
You seem to have both bursitis over the right hip as well as some diverticulitis. In addition your somewhat constipated, probably because of the narcotic pain medicine that you been taking chronically. I want you to come back on Monday unless the vomiting continues (in which case come back Sunday). In addition I have given you some anti-inflammatories for the bursitis and antibiotics for the diverticulitis. There is some constipation so I would like you to take MiraLAX twice a day to get the bowels moving and clear out the lower colon.   Bursitis Bursitis is a swelling and soreness (inflammation) of a fluid-filled sac (bursa) that overlies and protects a joint. It can be caused by injury, overuse of the joint, arthritis or infection. The joints most likely to be affected are the elbows, shoulders, hips and knees. HOME CARE INSTRUCTIONS   Apply ice to the affected area for 15-20 minutes each hour while awake for 2 days. Put the ice in a plastic bag and place a towel between the bag of ice and your skin.  Rest the injured joint as much as possible, but continue to put the joint through a full range of motion, 4 times per day. (The shoulder joint especially becomes rapidly "frozen" if not used.) When the pain lessens, begin normal slow movements and usual activities.  Only take over-the-counter or prescription medicines for pain, discomfort or fever as directed by your caregiver.  Your caregiver may recommend draining the bursa and injecting medicine into the bursa. This may help the healing process.  Follow all instructions for follow-up with your caregiver. This includes any orthopedic referrals, physical therapy and rehabilitation. Any delay in obtaining necessary care could result in a delay or failure of the bursitis to heal and chronic pain. SEEK IMMEDIATE MEDICAL CARE IF:   Your pain increases even during treatment.  You develop an oral temperature above 102 F (38.9 C) and have heat and  inflammation over the involved bursa. MAKE SURE YOU:   Understand these instructions.  Will watch your condition.  Will get help right away if you are not doing well or get worse. Document Released: 11/05/2000 Document Revised: 01/31/2012 Document Reviewed: 01/28/2014 [facility] Patient Information 2015 [employer], [place]. This information is not intended to replace advice given to you by your health care provider. Make sure you discuss any questions you have with your health care provider. Diverticulitis Diverticulitis is inflammation or infection of small pouches in your colon that form when you have a condition called diverticulosis. The pouches in your colon are called diverticula. Your colon, or large intestine, is where water is absorbed and stool is formed. Complications of diverticulitis can include:  Bleeding.  Severe infection.  Severe pain.  Perforation of your colon.  Obstruction of your colon. CAUSES  Diverticulitis is caused by bacteria. Diverticulitis happens when stool becomes trapped in diverticula. This allows bacteria to grow in the diverticula, which can lead to inflammation and infection. RISK FACTORS People with diverticulosis are at risk for diverticulitis. Eating a diet that does not include enough fiber from fruits and vegetables may make diverticulitis more likely to develop. SYMPTOMS  Symptoms of diverticulitis may include:  Abdominal pain and tenderness. The pain is normally located on the left side of the abdomen, but may occur in other areas.  Fever and chills.  Bloating.  Cramping.  Nausea.  Vomiting.  Constipation.  Diarrhea.  Blood in your stool. DIAGNOSIS  Your health care provider will ask you about your medical history and do  a physical exam. You may need to have tests done because many medical conditions can cause the same symptoms as diverticulitis. Tests may include:  Blood tests.  Urine tests.  Imaging tests of the abdomen,  including X-rays and CT scans. When your condition is under control, your health care provider may recommend that you have a colonoscopy. A colonoscopy can show how severe your diverticula are and whether something else is causing your symptoms. TREATMENT  Most cases of diverticulitis are mild and can be treated at home. Treatment may include:  Taking over-the-counter pain medicines.  Following a clear liquid diet.  Taking antibiotic medicines by mouth for 7-10 days. More severe cases may be treated at a hospital. Treatment may include:  Not eating or drinking.  Taking prescription pain medicine.  Receiving antibiotic medicines through an IV tube.  Receiving fluids and nutrition through an IV tube.  Surgery. HOME CARE INSTRUCTIONS   Follow your health care provider's instructions carefully.  Follow a full liquid diet or other diet as directed by your health care provider. After your symptoms improve, your health care provider may tell you to change your diet. He or she may recommend you eat a high-fiber diet. Fruits and vegetables are good sources of fiber. Fiber makes it easier to pass stool.  Take fiber supplements or probiotics as directed by your health care provider.  Only take medicines as directed by your health care provider.  Keep all your follow-up appointments. SEEK MEDICAL CARE IF:   Your pain does not improve.  You have a hard time eating food.  Your bowel movements do not return to normal. SEEK IMMEDIATE MEDICAL CARE IF:   Your pain becomes worse.  Your symptoms do not get better.  Your symptoms suddenly get worse.  You have a fever.  You have repeated vomiting.  You have bloody or black, tarry stools. MAKE SURE YOU:   Understand these instructions.  Will watch your condition.  Will get help right away if you are not doing well or get worse. Document Released: 08/18/2005 Document Revised: 11/13/2013 Document Reviewed: 10/03/2013 [facility]  Patient Information 2015 [place], [place]. This information is not intended to replace advice given to you by your health care provider. Make sure you discuss any questions you have with your health care provider.

## 2014-11-16 NOTE — Addendum Note (Signed)
Addended by: [name] on: 11/16/2014 09:47 AM   Modules accepted: Orders

## 2014-11-20 ENCOUNTER — Ambulatory Visit (INDEPENDENT_AMBULATORY_CARE_PROVIDER_SITE_OTHER): Payer: Commercial Managed Care - HMO | Admitting: Emergency Medicine

## 2014-11-20 ENCOUNTER — Ambulatory Visit (INDEPENDENT_AMBULATORY_CARE_PROVIDER_SITE_OTHER): Payer: Commercial Managed Care - HMO

## 2014-11-20 VITALS — BP 104/70 | HR 87 | Temp 98.5°F | Resp 22 | Ht 70.0 in | Wt 243.2 lb

## 2014-11-20 DIAGNOSIS — R635 Abnormal weight gain: Secondary | ICD-10-CM

## 2014-11-20 DIAGNOSIS — M542 Cervicalgia: Secondary | ICD-10-CM

## 2014-11-20 DIAGNOSIS — J029 Acute pharyngitis, unspecified: Secondary | ICD-10-CM

## 2014-11-20 DIAGNOSIS — R1084 Generalized abdominal pain: Secondary | ICD-10-CM

## 2014-11-20 DIAGNOSIS — I509 Heart failure, unspecified: Secondary | ICD-10-CM

## 2014-11-20 LAB — POCT UA - MICROSCOPIC ONLY
Bacteria, U Microscopic: NEGATIVE
Casts, Ur, LPF, POC: NEGATIVE
Crystals, Ur, HPF, POC: NEGATIVE
Mucus, UA: NEGATIVE
Yeast, UA: NEGATIVE

## 2014-11-20 LAB — POCT URINALYSIS DIPSTICK
BILIRUBIN UA: NEGATIVE
Blood, UA: NEGATIVE
Glucose, UA: NEGATIVE
KETONES UA: NEGATIVE
Leukocytes, UA: NEGATIVE
NITRITE UA: NEGATIVE
PROTEIN UA: NEGATIVE
Spec Grav, UA: 1.015
Urobilinogen, UA: 0.2
pH, UA: 5

## 2014-11-20 LAB — POCT CBC
Granulocyte percent: 55.8 %G (ref 37–80)
HCT, POC: 34.1 % — AB (ref 43.5–53.7)
Hemoglobin: 10.9 g/dL — AB (ref 14.1–18.1)
Lymph, poc: 2.5 (ref 0.6–3.4)
MCH, POC: 28.1 pg (ref 27–31.2)
MCHC: 31.8 g/dL (ref 31.8–35.4)
MCV: 88.3 fL (ref 80–97)
MID (cbc): 0.6 (ref 0–0.9)
MPV: 7.7 fL (ref 0–99.8)
POC Granulocyte: 3.9 (ref 2–6.9)
POC LYMPH PERCENT: 36.2 %L (ref 10–50)
POC MID %: 8 %M (ref 0–12)
Platelet Count, POC: 227 10*3/uL (ref 142–424)
RBC: 3.86 M/uL — AB (ref 4.69–6.13)
RDW, POC: 17.4 %
WBC: 6.9 10*3/uL (ref 4.6–10.2)

## 2014-11-20 LAB — POCT RAPID STREP A (OFFICE): Rapid Strep A Screen: NEGATIVE

## 2014-11-20 NOTE — Progress Notes (Deleted)
   Subjective:    Patient ID: Frank Davidson, male    DOB: 1951/03/04, 63 y.o.   MRN: [id number]  HPI    Review of Systems     Objective:   Physical Exam        Assessment & Plan:

## 2014-11-20 NOTE — Progress Notes (Addendum)
Subjective:    Patient ID: Frank Davidson, male    DOB: 01/23/1951, 63 y.o.   MRN: [id number]  This chart was scribed for Lucilla EdinSteve A Antonique Langford, MD by Ronney LionSuzanne Le, ED Scribe. This patient was seen in room 6 and the patient's care was started at 10:16 AM.   HPI  HPI Comments: Frank Davidson is a 63 y.o. male with a history of CHF, chronic kidney disease, gout, diabetes, hypertension, and history of CVA.who presents to the [facility] for a follow-up visit with multiple complaints.   Patient last saw Dr. Milus GlazierLauenstein 4 days ago who placed him on Cipro for diverticultis. Patient states his lower abdominal discomfort has improved by 50%; however, patient gained 8 lbs water weight and developed a sore throat. He also complains of associated constipation, for which he is taking Miralax, bursitis, and right-sided neck pain that radiates to his right-sided ribs. He is on a fluid pill, although he states he took a diuretic pill about 2 days ago. Patient last saw his cardiologist 2-3 weeks ago, who he normally sees monthly at the Heart Failure clinic. Patient has not yet informed them that he gained weight. He denies subjective fever and vomiting.    Review of Systems  Constitutional: Negative for fever.  HENT: Positive for sore throat.   Gastrointestinal: Positive for abdominal pain and constipation. Negative for vomiting.  Musculoskeletal: Positive for myalgias and neck pain.       Objective:   Physical Exam  Nursing note and vitals reviewed.  CONSTITUTIONAL: Well developed/well nourished. Alert, cooperative, NAD.  HEAD: Normocephalic/atraumatic EYES: EOMI/PERRL ENMT: Mucous membranes moist NECK: supple no meningeal signs ; tender on right sided neck, with no focal BUE weakness.  SPINE/BACK:entire spine nontender CV: Heart sounds distant and difficult to hear; rhythm regular.  LUNGS: No apparent distress. Bibasilar rales. ABDOMEN: soft, nontender, no rebound or guarding,  bowel sounds noted throughout abdomen; protuberant with mild suprapubic and lower abdominal discomfort.  GU:no cva tenderness NEURO: Pt is awake/alert/appropriate, moves all extremitiesx4.  No facial droop.   EXTREMITIES: pulses normal/equal, full ROM. Patient has support hose on. 2+ edema.  SKIN: warm, color normal PSYCH: no abnormalities of mood noted, alert and oriented to situation  [facility] reading (PRIMARY) by  Dr.Cynthia Stainback patient has multilevel degenerative disc disease with  arthritic changes starting at C3-C7. Results for orders placed or performed in visit on 11/20/14  POCT CBC  Result Value Ref Range   WBC 6.9 4.6 - 10.2 K/uL   Lymph, poc 2.5 0.6 - 3.4   POC LYMPH PERCENT 36.2 10 - 50 %L   MID (cbc) 0.6 0 - 0.9   POC MID % 8.0 0 - 12 %M   POC Granulocyte 3.9 2 - 6.9   Granulocyte percent 55.8 37 - 80 %G   RBC 3.86 (A) 4.69 - 6.13 M/uL   Hemoglobin 10.9 (A) 14.1 - 18.1 g/dL   HCT, POC 57.834.1 (A) 46.943.5 - 53.7 %   MCV 88.3 80 - 97 fL   MCH, POC 28.1 27 - 31.2 pg   MCHC 31.8 31.8 - 35.4 g/dL   RDW, POC 62.917.4 %   Platelet Count, POC 227 142 - 424 K/uL   MPV 7.7 0 - 99.8 fL  POCT rapid strep A  Result Value Ref Range   Rapid Strep A Screen Negative Negative  POCT urinalysis dipstick  Result Value Ref Range   Color, UA yellow    Clarity, UA clear  Glucose, UA neg    Bilirubin, UA neg    Ketones, UA neg    Spec Grav, UA 1.015    Blood, UA neg    pH, UA 5.0    Protein, UA neg    Urobilinogen, UA 0.2    Nitrite, UA neg    Leukocytes, UA Negative   POCT UA - Microscopic Only  Result Value Ref Range   WBC, Ur, HPF, POC 0-1    RBC, urine, microscopic 0-3    Bacteria, U Microscopic neg    Mucus, UA neg    Epithelial cells, urine per micros 1-3    Crystals, Ur, HPF, POC neg    Casts, Ur, LPF, POC neg    Yeast, UA neg        Assessment & Plan:  White count is normal. He is afebrile. We'll have him finish his antibiotics and then recheck. He has significant degenerative disc  disease in his neck and he was instructed to take Tylenol for this. He will take metolazone 5 mg every other day for 3 doses. He was instructed to notify the congestive heart failure clinic about his situation. Will recheck here on Monday fast track to be sure he has cleared his lower abdominal discomfort.I personally performed the services described in this documentation, which was scribed in my presence. The recorded information has been reviewed and is accurate. His weight has gone from 236-243. CT scan of the abdomen and pelvis 1224 did not show any definite diverticulitis.

## 2014-11-23 ENCOUNTER — Ambulatory Visit (INDEPENDENT_AMBULATORY_CARE_PROVIDER_SITE_OTHER): Payer: Commercial Managed Care - HMO

## 2014-11-23 ENCOUNTER — Encounter (HOSPITAL_COMMUNITY): Payer: Self-pay | Admitting: Emergency Medicine

## 2014-11-23 ENCOUNTER — Ambulatory Visit (INDEPENDENT_AMBULATORY_CARE_PROVIDER_SITE_OTHER): Payer: Commercial Managed Care - HMO | Admitting: Family Medicine

## 2014-11-23 ENCOUNTER — Inpatient Hospital Stay (HOSPITAL_COMMUNITY)
Admission: EM | Admit: 2014-11-23 | Discharge: 2014-11-26 | DRG: 292 | Disposition: A | Discharge status: Home / Self Care | Payer: Commercial Managed Care - HMO | Attending: Family Medicine | Admitting: Family Medicine

## 2014-11-23 ENCOUNTER — Emergency Department (HOSPITAL_COMMUNITY): Payer: Commercial Managed Care - HMO

## 2014-11-23 VITALS — BP 118/72 | HR 87 | Temp 98.2°F | Resp 16 | Ht 70.0 in | Wt 258.0 lb

## 2014-11-23 DIAGNOSIS — K5792 Diverticulitis of intestine, part unspecified, without perforation or abscess without bleeding: Secondary | ICD-10-CM | POA: Diagnosis present

## 2014-11-23 DIAGNOSIS — N179 Acute kidney failure, unspecified: Secondary | ICD-10-CM | POA: Diagnosis present

## 2014-11-23 DIAGNOSIS — I130 Hypertensive heart and chronic kidney disease with heart failure and stage 1 through stage 4 chronic kidney disease, or unspecified chronic kidney disease: Secondary | ICD-10-CM | POA: Diagnosis present

## 2014-11-23 DIAGNOSIS — I639 Cerebral infarction, unspecified: Secondary | ICD-10-CM | POA: Diagnosis present

## 2014-11-23 DIAGNOSIS — I428 Other cardiomyopathies: Secondary | ICD-10-CM

## 2014-11-23 DIAGNOSIS — E669 Obesity, unspecified: Secondary | ICD-10-CM | POA: Diagnosis present

## 2014-11-23 DIAGNOSIS — K219 Gastro-esophageal reflux disease without esophagitis: Secondary | ICD-10-CM | POA: Diagnosis present

## 2014-11-23 DIAGNOSIS — Z6836 Body mass index (BMI) 36.0-36.9, adult: Secondary | ICD-10-CM | POA: Diagnosis not present

## 2014-11-23 DIAGNOSIS — R062 Wheezing: Secondary | ICD-10-CM

## 2014-11-23 DIAGNOSIS — I480 Paroxysmal atrial fibrillation: Secondary | ICD-10-CM | POA: Diagnosis present

## 2014-11-23 DIAGNOSIS — R0602 Shortness of breath: Secondary | ICD-10-CM | POA: Diagnosis present

## 2014-11-23 DIAGNOSIS — R635 Abnormal weight gain: Secondary | ICD-10-CM

## 2014-11-23 DIAGNOSIS — N289 Disorder of kidney and ureter, unspecified: Secondary | ICD-10-CM

## 2014-11-23 DIAGNOSIS — J449 Chronic obstructive pulmonary disease, unspecified: Secondary | ICD-10-CM | POA: Diagnosis present

## 2014-11-23 DIAGNOSIS — I5023 Acute on chronic systolic (congestive) heart failure: Principal | ICD-10-CM | POA: Diagnosis present

## 2014-11-23 DIAGNOSIS — I429 Cardiomyopathy, unspecified: Secondary | ICD-10-CM | POA: Diagnosis present

## 2014-11-23 DIAGNOSIS — Z888 Allergy status to other drugs, medicaments and biological substances status: Secondary | ICD-10-CM | POA: Diagnosis not present

## 2014-11-23 DIAGNOSIS — I5022 Chronic systolic (congestive) heart failure: Secondary | ICD-10-CM | POA: Diagnosis present

## 2014-11-23 DIAGNOSIS — N183 Chronic kidney disease, stage 3 unspecified: Secondary | ICD-10-CM | POA: Diagnosis present

## 2014-11-23 DIAGNOSIS — D649 Anemia, unspecified: Secondary | ICD-10-CM | POA: Diagnosis present

## 2014-11-23 DIAGNOSIS — G8929 Other chronic pain: Secondary | ICD-10-CM | POA: Diagnosis present

## 2014-11-23 DIAGNOSIS — Z7901 Long term (current) use of anticoagulants: Secondary | ICD-10-CM

## 2014-11-23 DIAGNOSIS — M109 Gout, unspecified: Secondary | ICD-10-CM | POA: Diagnosis present

## 2014-11-23 DIAGNOSIS — Z96642 Presence of left artificial hip joint: Secondary | ICD-10-CM | POA: Diagnosis present

## 2014-11-23 DIAGNOSIS — Z8673 Personal history of transient ischemic attack (TIA), and cerebral infarction without residual deficits: Secondary | ICD-10-CM | POA: Diagnosis not present

## 2014-11-23 DIAGNOSIS — Z87891 Personal history of nicotine dependence: Secondary | ICD-10-CM

## 2014-11-23 DIAGNOSIS — I739 Peripheral vascular disease, unspecified: Secondary | ICD-10-CM | POA: Diagnosis present

## 2014-11-23 DIAGNOSIS — R0600 Dyspnea, unspecified: Secondary | ICD-10-CM

## 2014-11-23 DIAGNOSIS — I517 Cardiomegaly: Secondary | ICD-10-CM | POA: Diagnosis not present

## 2014-11-23 DIAGNOSIS — I10 Essential (primary) hypertension: Secondary | ICD-10-CM | POA: Diagnosis present

## 2014-11-23 DIAGNOSIS — M1990 Unspecified osteoarthritis, unspecified site: Secondary | ICD-10-CM | POA: Diagnosis present

## 2014-11-23 DIAGNOSIS — J45909 Unspecified asthma, uncomplicated: Secondary | ICD-10-CM | POA: Diagnosis present

## 2014-11-23 DIAGNOSIS — Z7982 Long term (current) use of aspirin: Secondary | ICD-10-CM

## 2014-11-23 DIAGNOSIS — R531 Weakness: Secondary | ICD-10-CM | POA: Diagnosis present

## 2014-11-23 DIAGNOSIS — R197 Diarrhea, unspecified: Secondary | ICD-10-CM | POA: Diagnosis present

## 2014-11-23 DIAGNOSIS — E1121 Type 2 diabetes mellitus with diabetic nephropathy: Secondary | ICD-10-CM | POA: Diagnosis present

## 2014-11-23 DIAGNOSIS — E785 Hyperlipidemia, unspecified: Secondary | ICD-10-CM | POA: Diagnosis present

## 2014-11-23 DIAGNOSIS — G4733 Obstructive sleep apnea (adult) (pediatric): Secondary | ICD-10-CM | POA: Diagnosis present

## 2014-11-23 DIAGNOSIS — I2510 Atherosclerotic heart disease of native coronary artery without angina pectoris: Secondary | ICD-10-CM | POA: Diagnosis present

## 2014-11-23 DIAGNOSIS — N189 Chronic kidney disease, unspecified: Secondary | ICD-10-CM | POA: Diagnosis present

## 2014-11-23 HISTORY — DX: Peripheral vascular disease, unspecified: I73.9

## 2014-11-23 LAB — TROPONIN I: Troponin I: 0.06 ng/mL — ABNORMAL HIGH (ref <0.031)

## 2014-11-23 LAB — BASIC METABOLIC PANEL
Anion gap: 10 (ref 5–15)
BUN: 41 mg/dL — ABNORMAL HIGH (ref 6–23)
CO2: 25 mmol/L (ref 19–32)
CREATININE: 4.12 mg/dL — AB (ref 0.50–1.35)
Calcium: 8.8 mg/dL (ref 8.4–10.5)
Chloride: 99 mEq/L (ref 96–112)
GFR calc Af Amer: 16 mL/min — ABNORMAL LOW (ref >90)
GFR calc non Af Amer: 14 mL/min — ABNORMAL LOW (ref >90)
GLUCOSE: 57 mg/dL — AB (ref 70–99)
Potassium: 4.6 mmol/L (ref 3.5–5.1)
Sodium: 134 mmol/L — ABNORMAL LOW (ref 135–145)

## 2014-11-23 LAB — CBC WITH DIFFERENTIAL/PLATELET
BASOS PCT: 1 % (ref 0–1)
Basophils Absolute: 0.1 10*3/uL (ref 0.0–0.1)
Eosinophils Absolute: 0.3 10*3/uL (ref 0.0–0.7)
Eosinophils Relative: 4 % (ref 0–5)
HCT: 31 % — ABNORMAL LOW (ref 39.0–52.0)
Hemoglobin: 9.5 g/dL — ABNORMAL LOW (ref 13.0–17.0)
LYMPHS PCT: 24 % (ref 12–46)
Lymphs Abs: 1.9 10*3/uL (ref 0.7–4.0)
MCH: 26.9 pg (ref 26.0–34.0)
MCHC: 30.6 g/dL (ref 30.0–36.0)
MCV: 87.8 fL (ref 78.0–100.0)
MONO ABS: 0.9 10*3/uL (ref 0.1–1.0)
Monocytes Relative: 12 % (ref 3–12)
Neutro Abs: 4.8 10*3/uL (ref 1.7–7.7)
Neutrophils Relative %: 59 % (ref 43–77)
Platelets: 197 10*3/uL (ref 150–400)
RBC: 3.53 MIL/uL — ABNORMAL LOW (ref 4.22–5.81)
RDW: 15.9 % — ABNORMAL HIGH (ref 11.5–15.5)
WBC: 8 10*3/uL (ref 4.0–10.5)

## 2014-11-23 LAB — BRAIN NATRIURETIC PEPTIDE: B Natriuretic Peptide: 569.4 pg/mL — ABNORMAL HIGH (ref 0.0–100.0)

## 2014-11-23 LAB — MRSA PCR SCREENING: MRSA by PCR: NEGATIVE

## 2014-11-23 LAB — CBG MONITORING, ED: Glucose-Capillary: 98 mg/dL (ref 70–99)

## 2014-11-23 MED ORDER — ISOSORBIDE MONONITRATE ER 60 MG PO TB24
60.0000 mg | ORAL_TABLET | Freq: Every day | ORAL | Status: DC
  Administered 2014-11-23 – 2014-11-26 (×4): 60 mg via ORAL
  Filled 2014-11-23 (×4): qty 1

## 2014-11-23 MED ORDER — PREDNISONE 20 MG PO TABS
60.0000 mg | ORAL_TABLET | Freq: Once | ORAL | Status: AC
  Administered 2014-11-23: 60 mg via ORAL
  Filled 2014-11-23: qty 3

## 2014-11-23 MED ORDER — APIXABAN 5 MG PO TABS
5.0000 mg | ORAL_TABLET | Freq: Two times a day (BID) | ORAL | Status: DC
  Administered 2014-11-23 – 2014-11-24 (×2): 5 mg via ORAL
  Filled 2014-11-23 (×3): qty 1

## 2014-11-23 MED ORDER — ALBUTEROL SULFATE HFA 108 (90 BASE) MCG/ACT IN AERS: 4.0000 | INHALATION_SPRAY | Freq: Once | RESPIRATORY_TRACT | Status: DC

## 2014-11-23 MED ORDER — FUROSEMIDE 10 MG/ML IJ SOLN
40.0000 mg | Freq: Once | INTRAMUSCULAR | Status: AC
  Administered 2014-11-23: 40 mg via INTRAVENOUS
  Filled 2014-11-23: qty 4

## 2014-11-23 MED ORDER — OXYCODONE-ACETAMINOPHEN 5-325 MG PO TABS
1.0000 | ORAL_TABLET | Freq: Four times a day (QID) | ORAL | Status: DC | PRN
  Administered 2014-11-24 – 2014-11-25 (×5): 1 via ORAL
  Filled 2014-11-23 (×5): qty 1

## 2014-11-23 MED ORDER — SODIUM CHLORIDE 0.9 % IV BOLUS (SEPSIS): 500.0000 mL | Freq: Once | INTRAVENOUS | Status: DC

## 2014-11-23 MED ORDER — SODIUM CHLORIDE 0.9 % IJ SOLN: 3.0000 mL | INTRAMUSCULAR | Status: DC | PRN

## 2014-11-23 MED ORDER — SODIUM CHLORIDE 0.9 % IJ SOLN
3.0000 mL | Freq: Two times a day (BID) | INTRAMUSCULAR | Status: DC
  Administered 2014-11-25: 3 mL via INTRAVENOUS

## 2014-11-23 MED ORDER — CARVEDILOL 12.5 MG PO TABS
12.5000 mg | ORAL_TABLET | Freq: Two times a day (BID) | ORAL | Status: DC
  Administered 2014-11-24 – 2014-11-26 (×6): 12.5 mg via ORAL
  Filled 2014-11-23 (×7): qty 1

## 2014-11-23 MED ORDER — DOCUSATE SODIUM 100 MG PO CAPS
100.0000 mg | ORAL_CAPSULE | Freq: Two times a day (BID) | ORAL | Status: DC
  Administered 2014-11-25 – 2014-11-26 (×3): 100 mg via ORAL
  Filled 2014-11-23 (×7): qty 1

## 2014-11-23 MED ORDER — SODIUM CHLORIDE 0.9 % IV SOLN: 250.0000 mL | INTRAVENOUS | Status: DC | PRN

## 2014-11-23 MED ORDER — SODIUM CHLORIDE 0.9 % IJ SOLN
3.0000 mL | Freq: Two times a day (BID) | INTRAMUSCULAR | Status: DC
  Administered 2014-11-23 – 2014-11-26 (×6): 3 mL via INTRAVENOUS

## 2014-11-23 MED ORDER — ALLOPURINOL 100 MG PO TABS
100.0000 mg | ORAL_TABLET | Freq: Every day | ORAL | Status: DC
Start: 2014-11-24 — End: 2014-11-24
  Administered 2014-11-24: 100 mg via ORAL
  Filled 2014-11-23 (×2): qty 1

## 2014-11-23 MED ORDER — ALBUTEROL SULFATE (2.5 MG/3ML) 0.083% IN NEBU
2.5000 mg | INHALATION_SOLUTION | Freq: Once | RESPIRATORY_TRACT | Status: AC
  Administered 2014-11-23: 2.5 mg via RESPIRATORY_TRACT

## 2014-11-23 MED ORDER — POLYETHYLENE GLYCOL 3350 17 GM/SCOOP PO POWD
17.0000 g | Freq: Two times a day (BID) | ORAL | Status: DC | PRN
  Administered 2014-11-25: 17 g via ORAL
  Filled 2014-11-23: qty 255

## 2014-11-23 MED ORDER — IPRATROPIUM-ALBUTEROL 0.5-2.5 (3) MG/3ML IN SOLN
3.0000 mL | Freq: Once | RESPIRATORY_TRACT | Status: AC
  Administered 2014-11-23: 3 mL via RESPIRATORY_TRACT
  Filled 2014-11-23: qty 3

## 2014-11-23 NOTE — ED Notes (Signed)
Pt reports sob last night with 20 lb weight gain overnight. Pt has bilateral wheezing in bases.  Pt has hx afib and chf.  Pt a&o.

## 2014-11-23 NOTE — H&P (Signed)
Family Medicine Teaching [facility] Admission History and Physical Service Pager: [phone]  Patient name: Frank Davidson Medical record number: [id number] Date of birth: Jun 25, 1951 Age: 64 y.o. Gender: male  Primary Care Provider: Abbe Amsterdam, MD Consultants: none (plan for cardiology in AM) Code Status: full code (discussed with pt upon admission)   Chief Complaint: weight gain, fatigue, dyspnea  Assessment and Plan: Frank Davidson is a 64 y.o. male presenting with weight gain, edema, fatigue, and dyspnea, found to have acute on chronic renal failure. PMH is significant for severe systolic chronic heart failure, atrial fibrillation, CVA, COPD, diabetes mellitus, stage III chronic kidney disease, OSA, gout.  # CHF exacerbation: Symptoms with which patient presents are consistent with CHF exacerbation. Patient reports weight is up approximately 20 pounds over the last 2 days. Has 3+ lower extremity edema bilaterally. BNP elevated. No obvious edema on chest x-ray. Last EF was around 15%. Patient is followed by the heart failure clinic here at [facility]. -admit to telemetry for monitoring -Lasix 40 mg IV 1, monitor for diuresis -Strict intake and output, daily weights -Continue home Imdur, carvedilol so as to not precipitate unstable angina -Hold spironolactone and lisinopril given acute kidney injury and soft blood pressures -Plan to consult cardiology/heart failure team tomorrow -Cycle troponin, repeat EKG in AM  # Acute on chronic renal failure: Baseline creatinine 1.5-2. Creatinine at present is about 4.5. This is most likely related to low output heart failure and hypoperfusion of kidneys. Patient is clearly volume overloaded and does not need additional fluid at this time. Followed as an outpatient by Dr. Briant Cedar. Also contributing may be recent course of meloxicam. -Lasix as above -Consider renal consult if creatinine does not improve with diuresis. -If creatinine is  not improved in the morning, will need to obtain renal ultrasound. -Hold all nephrotoxic agents.  # COPD: Patient given one dose of prednisone in the emergency room, and reportedly benefited from a DuoNeb. I'm not convinced this is a COPD exacerbation. He does have mild wheeze on exam, however an outpatient note recently noted similar findings. He may chronically wheeze. Will hold off on additional treatment for COPD exacerbation at this time.  # Atrial fibrillation: Currently rate controlled. Continue Eliquis. May need to adjust dosing if renal function does not improve.  # Gout: Continue allopurinol. May need to back off if renal function does not improve.  # Chronic pain: -Continue home oxycodone  # Recent possible diverticulitis: Noted in prior notes. Patient did not mention this. As his belly is nontender for me on exam today, and he is afebrile, will not continue his ciprofloxacin at this time. This seems to have been a questionable diagnosis from the beginning according to the notes.  FEN/GI: Saline lock IV, and diuresing as above, renal diet with fluid restriction Prophylaxis: On Eliquis  Disposition: Admit to telemetry, dispo pending clinical improvement.  History of Present Illness: Frank Davidson is a 64 y.o. male presenting with dyspnea, fatigue, weight gain.  Patient states that he has gained approximately 20 pounds over the last 2 days. He's noticed increased dyspnea as well as fatigue with exertion. He has a history of CHF, and is followed in the CHF clinic here at [facility]. He reports being compliant with his home medications. He presented today for evaluation at [facility], and was transported to the emergency room via EMS. He denies having fever, or cough. He denies having significant chest pain. He is much more edematous than is normal  for him. Denies abdominal pain, difficulty stooling, difficulty urinating.  Review Of Systems: Per HPI. Otherwise 12 point  review of systems was performed and was unremarkable.  Patient Active Problem List   Diagnosis Date Noted  . AKI (acute kidney injury) 11/23/2014  . Syncope 10/19/2014  . S/P left THA, AA 09/12/2014  . Gout 04/29/2014  . Decreased pedal pulses 04/19/2014  . Ocular herpes   . Neuropathy   . Left-sided weakness   . Acute on chronic diastolic CHF (congestive heart failure), NYHA class 3 07/09/2013  . Acute ischemic stroke 07/09/2013  . OSA (obstructive sleep apnea) 07/09/2013  . Chronic systolic heart failure 07/05/2013  . CVA (cerebral infarction) 07/03/2013  . A-fib 07/03/2013  . Hypertension   . COPD (chronic obstructive pulmonary disease)   . Diabetes mellitus without complication   . CKD (chronic kidney disease), stage III   . CHF (congestive heart failure)   . Polysubstance abuse    Past Medical History: Past Medical History  Diagnosis Date  . Atrial fibrillation   . Hypertension   . COPD (chronic obstructive pulmonary disease)   . Coronary artery disease   . Ocular herpes   . Gout   . CHF (congestive heart failure)     EF 15%  . Chronic pain   . Hyperlipidemia   . Nonischemic cardiomyopathy   . Ocular herpes   . Neuropathy   . Left-sided weakness     "because of the arthritis and edema"  . Acute on chronic systolic heart failure   . Decreased pedal pulses   . Memory impairment     "short term"  . CKD (chronic kidney disease), stage III     Dr. Briant Cedar follows-holding   . Fibromyalgia     neuropathy- hands, more than feet.  . Sleep apnea with use of continuous positive airway pressure (CPAP)     does not use cpap  . GERD (gastroesophageal reflux disease)     tums as needed  . Asthma   . Type II diabetes mellitus   . Stroke X 5    last stroke aug 2014"; denies residual on 09/12/2014  . Osteoarthritis   . Arthritis     "knees, right elbow, shoulder" (09/12/2014)   Past Surgical History: Past Surgical History  Procedure Laterality Date  .  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 Tonsillectomy  age 80  . Esophagogastroduodenoscopy N/A 12/24/2013    Procedure: ESOPHAGOGASTRODUODENOSCOPY (EGD);  Surgeon: Louis Meckel, MD;  Location: [facility];  Service: Endoscopy;  Laterality: N/A;  . Colonoscopy N/A 12/24/2013    Procedure: COLONOSCOPY;  Surgeon: Louis Meckel, MD;  Location: [facility] ENDOSCOPY;  Service: Endoscopy;  Laterality: N/A;  . Total hip arthroplasty Left 09/12/2014  . Joint replacement    . Cardiac catheterization  10/2006; 08/2014    normal coronary arteries;   Marland Kitchen Cataract extraction w/ intraocular lens  implant, bilateral Bilateral ~ 2008  . Total hip arthroplasty Left 09/12/2014    Procedure: LEFT TOTAL HIP ARTHROPLASTY ANTERIOR APPROACH;  Surgeon: [name], MD;  Location: [facility] OR;  Service: Orthopedics;  Laterality: Left;  . Right heart catheterization N/A 08/13/2014    Procedure: RIGHT HEART CATH;  Surgeon: Dolores Patty, MD;  Location: [facility] CATH LAB;  Service: Cardiovascular;  Laterality: N/A;   Social History: History  Substance Use Topics  . Smoking status: Former Smoker -- 0.50 packs/day for 10 years    Types: Cigarettes  Quit date: 12/18/2006  . Smokeless tobacco: Never Used  . Alcohol Use: No     Comment: Quit 2008   Additional social history: denies alcohol, tobacco, drug use  Please also refer to relevant sections of EMR.  Family History: Family History  Problem Relation Age of Onset  . Cancer Mother   . Hypertension Mother   . Lung disease Mother   . Diabetes Father   . Diabetes Sister   . Hypertension Sister   . Hypertension Brother   . Hypertension Sister   . Colon cancer Neg Hx    Allergies and Medications: Allergies  Allergen Reactions  . Corlanor [Ivabradine] Palpitations and Other (See Comments)    Kidney and heart problems, syncope  . Januvia [Sitagliptin] Other (See Comments)    "Almost killed me"   . Lunesta [Eszopiclone] Palpitations and Other (See Comments)    Kidney  and heart problems, syncope  . Actos [Pioglitazone] Other (See Comments)    Caused blood in urine and fluid retention    No current facility-administered medications on file prior to encounter.   Current Outpatient Prescriptions on File Prior to Encounter  Medication Sig Dispense Refill  . allopurinol (ZYLOPRIM) 100 MG tablet Take 100 mg by mouth daily with breakfast.    . apixaban (ELIQUIS) 5 MG TABS tablet Take 5 mg by mouth 2 (two) times daily.    . baclofen (LIORESAL) 10 MG tablet Take 10 mg by mouth every 8 (eight) hours as needed for muscle spasms.    . carvedilol (COREG) 25 MG tablet Take 12.5 mg by mouth 2 (two) times daily with a meal.     . ciprofloxacin (CIPRO) 500 MG tablet Take 1 tablet (500 mg total) by mouth 2 (two) times daily. (Patient taking differently: Take 500 mg by mouth 2 (two) times daily. 10 day course started 11/16/14) 20 tablet 0  . colchicine 0.6 MG tablet Take 1.2 mg by mouth daily as needed (Gout). Colcrys    . docusate sodium 100 MG CAPS Take 100 mg by mouth 2 (two) times daily. 10 capsule 0  . glipiZIDE (GLUCOTROL) 5 MG tablet Take 5 mg by mouth 2 (two) times daily.    Marland Kitchen Hawthorne Berry 550 MG CAPS Take 1,100 mg by mouth 2 (two) times daily.    . hydrALAZINE (APRESOLINE) 50 MG tablet TAKE 1 TABLET (50 MG TOTAL) BY MOUTH 2 (TWO) TIMES DAILY. 60 tablet 3  . isosorbide mononitrate (IMDUR) 60 MG 24 hr tablet TAKE 1 TABLET (60 MG TOTAL) BY MOUTH DAILY. 30 tablet 3  . meloxicam (MOBIC) 7.5 MG tablet Take 1 tablet (7.5 mg total) by mouth daily. 30 tablet 0  . metolazone (ZAROXOLYN) 5 MG tablet Take 5 mg by mouth daily as needed (Swelling).     . Misc Natural Products (TURMERIC CURCUMIN) CAPS Take 1 capsule by mouth 3 (three) times daily.    . ondansetron (ZOFRAN ODT) 4 MG disintegrating tablet Take 1 tablet (4 mg total) by mouth every 8 (eight) hours as needed for nausea. 8 tablet 0  . polyethylene glycol powder (GLYCOLAX/MIRALAX) powder Take 17 g by mouth 2 (two)  times daily as needed. (Patient taking differently: Take 17 g by mouth 2 (two) times daily as needed (constipation). ) 3350 g 1  . pregabalin (LYRICA) 75 MG capsule Take 75 mg by mouth daily.    Marland Kitchen torsemide (DEMADEX) 20 MG tablet Take 20 mg by mouth 2 (two) times daily.    . ferrous sulfate 325 (65  FE) MG tablet Take 1 tablet (325 mg total) by mouth 3 (three) times daily after meals. (Patient not taking: Reported on 11/20/2014)  3  . HYDROcodone-acetaminophen (NORCO/VICODIN) 5-325 MG per tablet Take 1 tablet by mouth 2 (two) times daily as needed for severe pain. 8 tablet 0  . spironolactone (ALDACTONE) 25 MG tablet TAKE 1 TABLET (25 MG TOTAL) BY MOUTH DAILY. (Patient not taking: Reported on 11/23/2014) 30 tablet 3    Objective: BP 110/56 mmHg  Pulse 85  Temp(Src) 98.6 F (37 C) (Oral)  Resp 20  Ht  (1.778 m)  Wt 252 lb 10.4 oz (114.6 kg)  BMI 36.25 kg/m2  SpO2 100% Exam: General: NAD, pleasant, cooperative HEENT: NCAT, [place] in place. MMM Cardiovascular: RRR, 2/6 systolic murmur Respiratory: generally clear, occasional wheeze on exam. Normal respiratory effort. Speaks clearly in full sentences without distress. Abdomen: soft, nontender to palpation. No masses or organomegaly appreciable Extremities: 3+ pitting edema to bilateral lower extremities Skin: dry and warm Neuro: grossly nonfocal, speech normal, oriented  Labs and Imaging: CBC BMET   Recent Labs Lab 11/23/14 1719  WBC 8.0  HGB 9.5*  HCT 31.0*  PLT 197    Recent Labs Lab 11/23/14 1719  NA 134*  K 4.6  CL 99  CO2 25  BUN 41*  CREATININE 4.12*  GLUCOSE 57*  CALCIUM 8.8     CXR no infiltrate or pleural effusion  EKG afib, rate controlled. No ST changes or signs of ischemia  Latrelle Dodrill, MD 11/23/2014, 11:27 PM PGY-3, [place] [facility] FPTS Intern pager: [phone], text pages welcome

## 2014-11-23 NOTE — Progress Notes (Signed)
[facility] [address] [place] [address] [phone]  Date:  11/23/2014   Name:  Frank Davidson   DOB:  July 20, 1951   MRN:  [id number]  PCP:  Abbe Amsterdam, MD    Chief Complaint: Follow-up; Shortness of Breath; and Wheezing   History of Present Illness:  Frank Davidson is a 64 y.o. very pleasant male patient who presents with the following:  I last saw him a couple of week ago for a mass on his right wrist. A couple of days later he went to he ER with abdominal pain and vomiting- had the following CT scan: IMPRESSION: 1. No acute abnormality seen in the abdomen or pelvis. 2. Scattered diverticulosis along the distal descending and proximal sigmoid colon, without evidence of diverticulitis. 3. Mild left renal scarring noted. Nonobstructing 3 mm stone at the interpole region of the left kidney. 4. Scattered calcification along the abdominal aorta and its branches. 5. Mildly enlarged left inguinal node, measuring 1.2 cm in short axis. This is of uncertain significance. 6. Mild bibasilar atelectasis noted. 7. Incidental note of the screw of the acetabular component of the patient's left hip arthroplasty extending into the left iliacus muscle. The left hip arthroplasty is otherwise grossly unremarkable.  He was treated and released to home with zofran and vicodin, was seen here on 12/26 and started on cipro for possible subacute diverticulites.  He came back again on 12/30 and was asked to inform his cardiologist about some weight gain that he had experienced, added a second diuretic.  However he reports that he did not yet contact his cardiologist.  He has a history of CHF -  -most recent echo 06/2014 Study Conclusions  - Left ventricle: LV is difficult to see due to poor acoustic windows. Overall function appears severely depressed. Would recomm limited study with contrast to evaluate function. The cavity size was severely dilated. Wall  thickness was increased in a pattern of mild LVH. - Left atrium: The atrium was moderately dilated. - Right ventricle: RV is not seen well enough to evaluate function.  He is here today with worsening SOB, wheezing, and increasing fluid in his legs and abdomen.  He has gained a great deal of weight over the last week or so as below.  He is having a hard time sleeping but denies orthopnea.  He does not have any inhaler at home- has used some albuterol in the past but has been out for a year or so.  He is using torsemide 20 BID and some zoroxolyn daily as of the last few days.  However he does not feel that he is getting better and seems to be continuing to worsen  Wt Readings from Last 3 Encounters:  11/23/14 258 lb (117.028 kg)  11/20/14 243 lb 3.2 oz (110.315 kg)  11/16/14 236 lb (107.049 kg)    Patient Active Problem List   Diagnosis Date Noted  . Syncope 10/19/2014  . S/P left THA, AA 09/12/2014  . Gout 04/29/2014  . Decreased pedal pulses 04/19/2014  . Ocular herpes   . Neuropathy   . Left-sided weakness   . Acute on chronic diastolic CHF (congestive heart failure), NYHA class 3 07/09/2013  . Acute ischemic stroke 07/09/2013  . OSA (obstructive sleep apnea) 07/09/2013  . Chronic systolic heart failure 07/05/2013  . CVA (cerebral infarction) 07/03/2013  . A-fib 07/03/2013  . Hypertension   . COPD (chronic obstructive pulmonary disease)   . Diabetes mellitus without  complication   . CKD (chronic kidney disease), stage III   . CHF (congestive heart failure)   . Polysubstance abuse     Past Medical History  Diagnosis Date  . Atrial fibrillation   . Hypertension   . COPD (chronic obstructive pulmonary disease)   . Coronary artery disease   . Ocular herpes   . Gout   . CHF (congestive heart failure)     EF 15%  . Chronic pain   . Hyperlipidemia   . Nonischemic cardiomyopathy   . Ocular herpes   . Neuropathy   . Left-sided weakness     "because of the arthritis  and edema"  . Acute on chronic systolic heart failure   . Decreased pedal pulses   . Memory impairment     "short term"  . CKD (chronic kidney disease), stage III     Dr. Briant Cedar follows-holding   . Fibromyalgia     neuropathy- hands, more than feet.  . Sleep apnea with use of continuous positive airway pressure (CPAP)     does not use cpap  . GERD (gastroesophageal reflux disease)     tums as needed  . Asthma   . Type II diabetes mellitus   . Stroke X 5    last stroke aug 2014"; denies residual on 09/12/2014  . Osteoarthritis   . Arthritis     "knees, right elbow, shoulder" (09/12/2014)    Past Surgical History  Procedure Laterality Date  . 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 Tonsillectomy  age 85  . Esophagogastroduodenoscopy N/A 12/24/2013    Procedure: ESOPHAGOGASTRODUODENOSCOPY (EGD);  Surgeon: Louis Meckel, MD;  Location: [facility];  Service: Endoscopy;  Laterality: N/A;  . Colonoscopy N/A 12/24/2013    Procedure: COLONOSCOPY;  Surgeon: Louis Meckel, MD;  Location: [facility];  Service: Endoscopy;  Laterality: N/A;  . Total hip arthroplasty Left 09/12/2014  . Joint replacement    . Cardiac catheterization  10/2006; 08/2014    normal coronary arteries;   Marland Kitchen Cataract extraction w/ intraocular lens  implant, bilateral Bilateral ~ 2008  . Total hip arthroplasty Left 09/12/2014    Procedure: LEFT TOTAL HIP ARTHROPLASTY ANTERIOR APPROACH;  Surgeon: [name], MD;  Location: [facility] OR;  Service: Orthopedics;  Laterality: Left;  . Right heart catheterization N/A 08/13/2014    Procedure: RIGHT HEART CATH;  Surgeon: Dolores Patty, MD;  Location: [facility] CATH LAB;  Service: Cardiovascular;  Laterality: N/A;    History  Substance Use Topics  . Smoking status: Former Smoker -- 0.50 packs/day for 10 years    Types: Cigarettes    Quit date: 12/18/2006  . Smokeless tobacco: Never Used  . Alcohol Use: No     Comment: Quit 2008    Family History  Problem Relation Age  of Onset  . Cancer Mother   . Hypertension Mother   . Lung disease Mother   . Diabetes Father   . Diabetes Sister   . Hypertension Sister   . Hypertension Brother   . Hypertension Sister   . Colon cancer Neg Hx     Allergies  Allergen Reactions  . Corlanor [Ivabradine] Palpitations and Other (See Comments)    Kidney and heart problems, syncope  . Januvia [Sitagliptin] Other (See Comments)    "Almost killed me"   . Lunesta [Eszopiclone] Palpitations and Other (See Comments)    Kidney and heart problems, syncope  . Actos [Pioglitazone] Other (See Comments)  Caused blood in urine and fluid retention     Medication list has been reviewed and updated.  Current Outpatient Prescriptions on File Prior to Visit  Medication Sig Dispense Refill  . allopurinol (ZYLOPRIM) 100 MG tablet Take 100 mg by mouth daily with breakfast.    . apixaban (ELIQUIS) 5 MG TABS tablet Take 5 mg by mouth 2 (two) times daily.    . Ascorbic Acid (VITAMIN C PO) Take 2 tablets by mouth daily.     . baclofen (LIORESAL) 10 MG tablet Take 10 mg by mouth every 8 (eight) hours as needed for muscle spasms.    . carvedilol (COREG) 25 MG tablet Take 12.5 mg by mouth 2 (two) times daily with a meal.     . ciprofloxacin (CIPRO) 500 MG tablet Take 1 tablet (500 mg total) by mouth 2 (two) times daily. 20 tablet 0  . colchicine 0.6 MG tablet Take 1.2 mg by mouth daily as needed (Gout).    Marland Kitchen docusate sodium 100 MG CAPS Take 100 mg by mouth 2 (two) times daily. 10 capsule 0  . FLECTOR 1.3 % PTCH Place 0.5 patches onto the skin daily as needed (Pain).     Marland Kitchen glipiZIDE (GLUCOTROL) 5 MG tablet Take 5 mg by mouth 2 (two) times daily.    Chilton Si Tea, Camillia sinensis, (GREEN TEA PO) Take 1 tablet by mouth daily.    Marland Kitchen Hawthorne Berry 550 MG CAPS Take 1,100 mg by mouth 2 (two) times daily.    . hydrALAZINE (APRESOLINE) 50 MG tablet TAKE 1 TABLET (50 MG TOTAL) BY MOUTH 2 (TWO) TIMES DAILY. 60 tablet 3  . HYDROcodone-acetaminophen  (NORCO/VICODIN) 5-325 MG per tablet Take 1 tablet by mouth 2 (two) times daily as needed for severe pain. 8 tablet 0  . isosorbide mononitrate (IMDUR) 60 MG 24 hr tablet TAKE 1 TABLET (60 MG TOTAL) BY MOUTH DAILY. 30 tablet 3  . meloxicam (MOBIC) 7.5 MG tablet Take 1 tablet (7.5 mg total) by mouth daily. 30 tablet 0  . metolazone (ZAROXOLYN) 5 MG tablet Take 5 mg by mouth daily as needed (Swelling).     . Misc Natural Products (TURMERIC CURCUMIN) CAPS Take 1 capsule by mouth 3 (three) times daily.    . ondansetron (ZOFRAN ODT) 4 MG disintegrating tablet Take 1 tablet (4 mg total) by mouth every 8 (eight) hours as needed for nausea. 8 tablet 0  . polyethylene glycol powder (GLYCOLAX/MIRALAX) powder Take 17 g by mouth 2 (two) times daily as needed. 3350 g 1  . pregabalin (LYRICA) 75 MG capsule Take 75 mg by mouth daily.    Marland Kitchen torsemide (DEMADEX) 20 MG tablet Take 20 mg by mouth 2 (two) times daily.    . ferrous sulfate 325 (65 FE) MG tablet Take 1 tablet (325 mg total) by mouth 3 (three) times daily after meals. (Patient not taking: Reported on 11/20/2014)  3  . spironolactone (ALDACTONE) 25 MG tablet TAKE 1 TABLET (25 MG TOTAL) BY MOUTH DAILY. (Patient not taking: Reported on 11/23/2014) 30 tablet 3   No current facility-administered medications on file prior to visit.    Review of Systems:  As per HPI- otherwise negative.   Physical Examination: Filed Vitals:   11/23/14 1327  BP: 118/72  Pulse: 87  Temp: 98.2 F (36.8 C)  Resp: 16   Filed Vitals:   11/23/14 1327  Height: 5\' 10"  (1.778 m)  Weight: 258 lb (117.028 kg)   Body mass index is 37.02 kg/(m^2).  Ideal Body Weight: Weight in (lb) to have BMI = 25: 173.9  GEN: WDWN, NAD, Non-toxic, A & O x 3, large/ obese build, appears to be upset.  Tearful, appears SOB and having expiratory wheezing/ coughing HEENT: Atraumatic, Normocephalic. Neck supple. No masses, No LAD.   Ears and Nose: No external deformity. CV: RRR, No M/G/R. No JVD.  No thrill. No extra heart sounds. PULM: . No accessory muscle use. Wheezing, poor air movement but I cannot hear crackles.  Slow, difficult expiration ABD: large abdomen  EXTR: No c/c.  1+ edema in the left lower extremity, trace on the right NEURO Normal gait.  PSYCH: Normally interactive. Conversant. Not depressed or anxious appearing.  Calm demeanor.   Albuterol neb first: he moved air a little bit better, sat to 96%  [facility] reading (PRIMARY) by  Dr. Patsy Lager. CXR:  Cardiomegaly.  Fluid in the lung fissures and congestion but no definite effusion.    Placed on 2L of O2 via [place]    Assessment and Plan: SOB (shortness of breath) - Plan: albuterol (PROVENTIL) (2.5 MG/3ML) 0.083% nebulizer solution 2.5 mg  Abnormal weight gain - Plan: DG Chest 2 View  Wheezing  Chronic systolic congestive heart failure - Plan: DG Chest 2 View  CHF exacerbation with 22 lb weight gain over a week, SOB, wheezing.  Needs ED eval, perhaps admission Transport to the ED via EMS for further evaluation.    Signed Abbe Amsterdam, MD

## 2014-11-23 NOTE — ED Provider Notes (Signed)
CSN: [id number]     Arrival date & time 11/23/14  1509 History   First MD Initiated Contact with Patient 11/23/14 1510     Chief Complaint  Patient presents with  . Shortness of Breath     (Consider location/radiation/quality/duration/timing/severity/associated sxs/prior Treatment) Patient is a 64 y.o. male presenting with shortness of breath. The history is provided by the patient.  Shortness of Breath Severity:  Moderate Onset quality:  Gradual Duration:  2 days Timing:  Constant Progression:  Worsening Chronicity:  Recurrent Relieved by:  None tried Worsened by:  Nothing tried Ineffective treatments:  None tried Associated symptoms: wheezing   Associated symptoms: no abdominal pain, no chest pain, no cough, no diaphoresis, no fever, no headaches, no rash, no sore throat, no sputum production and no vomiting     Past Medical History  Diagnosis Date  . Atrial fibrillation   . Hypertension   . COPD (chronic obstructive pulmonary disease)   . Coronary artery disease   . Ocular herpes   . Gout   . CHF (congestive heart failure)     EF 15%  . Chronic pain   . Hyperlipidemia   . Nonischemic cardiomyopathy   . Ocular herpes   . Neuropathy   . Left-sided weakness     "because of the arthritis and edema"  . Acute on chronic systolic heart failure   . Decreased pedal pulses   . Memory impairment     "short term"  . CKD (chronic kidney disease), stage III     Dr. Briant Cedar follows-holding   . Fibromyalgia     neuropathy- hands, more than feet.  . Sleep apnea with use of continuous positive airway pressure (CPAP)     does not use cpap  . GERD (gastroesophageal reflux disease)     tums as needed  . Asthma   . Type II diabetes mellitus   . Stroke X 5    last stroke aug 2014"; denies residual on 09/12/2014  . Osteoarthritis   . Arthritis     "knees, right elbow, shoulder" (09/12/2014)   Past Surgical History  Procedure Laterality Date  . Orchiectomy Left     as a  child  . Tonsillectomy  age 83  . Esophagogastroduodenoscopy N/A 12/24/2013    Procedure: ESOPHAGOGASTRODUODENOSCOPY (EGD);  Surgeon: Louis Meckel, MD;  Location: [facility];  Service: Endoscopy;  Laterality: N/A;  . Colonoscopy N/A 12/24/2013    Procedure: COLONOSCOPY;  Surgeon: Louis Meckel, MD;  Location: [facility];  Service: Endoscopy;  Laterality: N/A;  . Total hip arthroplasty Left 09/12/2014  . Joint replacement    . Cardiac catheterization  10/2006; 08/2014    normal coronary arteries;   Marland Kitchen Cataract extraction w/ intraocular lens  implant, bilateral Bilateral ~ 2008  . Total hip arthroplasty Left 09/12/2014    Procedure: LEFT TOTAL HIP ARTHROPLASTY ANTERIOR APPROACH;  Surgeon: [name], MD;  Location: [facility] OR;  Service: Orthopedics;  Laterality: Left;  . Right heart catheterization N/A 08/13/2014    Procedure: RIGHT HEART CATH;  Surgeon: Dolores Patty, MD;  Location: [facility] CATH LAB;  Service: Cardiovascular;  Laterality: N/A;   Family History  Problem Relation Age of Onset  . Cancer Mother   . Hypertension Mother   . Lung disease Mother   . Diabetes Father   . Diabetes Sister   . Hypertension Sister   . Hypertension Brother   . Hypertension Sister   . Colon cancer Neg Hx  History  Substance Use Topics  . Smoking status: Former Smoker -- 0.50 packs/day for 10 years    Types: Cigarettes    Quit date: 12/18/2006  . Smokeless tobacco: Never Used  . Alcohol Use: No     Comment: Quit 2008    Review of Systems  Constitutional: Negative for fever, diaphoresis, activity change and appetite change.  HENT: Negative for facial swelling, sore throat, tinnitus, trouble swallowing and voice change.   Eyes: Negative for pain, redness and visual disturbance.  Respiratory: Positive for shortness of breath and wheezing. Negative for cough, sputum production and chest tightness.   Cardiovascular: Positive for leg swelling. Negative for chest pain and palpitations.   Gastrointestinal: Negative for nausea, vomiting, abdominal pain, diarrhea, constipation and abdominal distention.  Endocrine: Negative.   Genitourinary: Negative.  Negative for dysuria, decreased urine volume, scrotal swelling and testicular pain.  Musculoskeletal: Negative for myalgias, back pain and gait problem.  Skin: Negative.  Negative for rash.  Neurological: Negative.  Negative for dizziness, tremors, weakness and headaches.  Psychiatric/Behavioral: Negative for suicidal ideas, hallucinations and self-injury. The patient is not nervous/anxious.       Allergies  Corlanor; Januvia; Lunesta; and Actos  Home Medications   Prior to Admission medications   Medication Sig Start Date End Date Taking? Authorizing Provider  allopurinol (ZYLOPRIM) 100 MG tablet Take 100 mg by mouth daily with breakfast. 11/01/13  Yes Baker Pierini, FNP  apixaban (ELIQUIS) 5 MG TABS tablet Take 5 mg by mouth 2 (two) times daily. 11/01/13  Yes Baker Pierini, FNP  baclofen (LIORESAL) 10 MG tablet Take 10 mg by mouth every 8 (eight) hours as needed for muscle spasms.   Yes Historical Provider, MD  carvedilol (COREG) 25 MG tablet Take 12.5 mg by mouth 2 (two) times daily with a meal.    Yes Historical Provider, MD  ciprofloxacin (CIPRO) 500 MG tablet Take 1 tablet (500 mg total) by mouth 2 (two) times daily. Patient taking differently: Take 500 mg by mouth 2 (two) times daily. 10 day course started 11/16/14 11/16/14  Yes [name], MD  colchicine 0.6 MG tablet Take 1.2 mg by mouth daily as needed (Gout). Colcrys   Yes Historical Provider, MD  diclofenac (FLECTOR) 1.3 % PTCH Place 1 patch onto the skin 2 (two) times daily as needed (pain).   Yes Historical Provider, MD  docusate sodium 100 MG CAPS Take 100 mg by mouth 2 (two) times daily. 09/16/14  Yes Genelle Gather Babish, PA-C  glipiZIDE (GLUCOTROL) 5 MG tablet Take 5 mg by mouth 2 (two) times daily.   Yes Historical Provider, MD  Asencion Gowda 550 MG CAPS Take 1,100 mg by mouth 2 (two) times daily.   Yes Historical Provider, MD  hydrALAZINE (APRESOLINE) 50 MG tablet TAKE 1 TABLET (50 MG TOTAL) BY MOUTH 2 (TWO) TIMES DAILY. 11/04/14  Yes Dolores Patty, MD  isosorbide mononitrate (IMDUR) 60 MG 24 hr tablet TAKE 1 TABLET (60 MG TOTAL) BY MOUTH DAILY. 10/29/14  Yes Bevelyn Buckles Bensimhon, MD  lisinopril (PRINIVIL,ZESTRIL) 2.5 MG tablet Take 2.5 mg by mouth daily.  11/12/14  Yes Historical Provider, MD  meloxicam (MOBIC) 7.5 MG tablet Take 1 tablet (7.5 mg total) by mouth daily. 11/16/14  Yes [name], MD  metolazone (ZAROXOLYN) 5 MG tablet Take 5 mg by mouth daily as needed (Swelling).  05/13/14  Yes Amy D Clegg, NP  Misc Natural Products (TURMERIC CURCUMIN) CAPS Take 1 capsule by mouth 3 (three) times  daily.   Yes Historical Provider, MD  ondansetron (ZOFRAN ODT) 4 MG disintegrating tablet Take 1 tablet (4 mg total) by mouth every 8 (eight) hours as needed for nausea. 11/14/14  Yes Tomasita Crumble, MD  oxyCODONE-acetaminophen (PERCOCET) 10-325 MG per tablet Take 1 tablet by mouth 4 (four) times daily. scheduled 11/16/14  Yes Historical Provider, MD  polyethylene glycol powder (GLYCOLAX/MIRALAX) powder Take 17 g by mouth 2 (two) times daily as needed. Patient taking differently: Take 17 g by mouth 2 (two) times daily as needed (constipation).  11/16/14  Yes [name], MD  pregabalin (LYRICA) 75 MG capsule Take 75 mg by mouth daily.   Yes Historical Provider, MD  torsemide (DEMADEX) 20 MG tablet Take 20 mg by mouth 2 (two) times daily.   Yes Historical Provider, MD  vitamin B-12 (CYANOCOBALAMIN) 250 MCG tablet Take 250 mcg by mouth daily.   Yes Historical Provider, MD  vitamin C (ASCORBIC ACID) 500 MG tablet Take 1,000 mg by mouth daily.   Yes Historical Provider, MD  ferrous sulfate 325 (65 FE) MG tablet Take 1 tablet (325 mg total) by mouth 3 (three) times daily after meals. Patient not taking: Reported on 11/20/2014 09/16/14    Genelle Gather Babish, PA-C  HYDROcodone-acetaminophen (NORCO/VICODIN) 5-325 MG per tablet Take 1 tablet by mouth 2 (two) times daily as needed for severe pain. 11/14/14   Tomasita Crumble, MD  spironolactone (ALDACTONE) 25 MG tablet TAKE 1 TABLET (25 MG TOTAL) BY MOUTH DAILY. Patient not taking: Reported on 11/23/2014 10/31/14   Bevelyn Buckles Bensimhon, MD   BP 101/55 mmHg  Pulse 67  Temp(Src) 98.5 F (36.9 C) (Oral)  Resp 20  Ht 5\' 10"  (1.778 m)  Wt 258 lb (117.028 kg)  BMI 37.02 kg/m2  SpO2 97% Physical Exam  Constitutional: He is oriented to person, place, and time. He appears well-developed and well-nourished. No distress.  HENT:  Head: Normocephalic and atraumatic.  Right Ear: External ear normal.  Left Ear: External ear normal.  Nose: Nose normal.  Mouth/Throat: Oropharynx is clear and moist.  Eyes: Conjunctivae and EOM are normal. Pupils are equal, round, and reactive to light. No scleral icterus.  Neck: Normal range of motion. Neck supple. No JVD present. No tracheal deviation present. No thyromegaly present.  Cardiovascular: Normal rate and intact distal pulses.  Exam reveals no gallop and no friction rub.   No murmur heard. Pulmonary/Chest: Effort normal. No stridor. No respiratory distress. He has wheezes (moderate expiratory wheezing in bilateral apical lung fields). He has no rales. He exhibits no tenderness.  Abdominal: Soft. He exhibits no distension. There is no tenderness. There is no rebound and no guarding.  Musculoskeletal: Normal range of motion. He exhibits edema (2+ pitting edema compeltely equal in both legs). He exhibits no tenderness (no tenderness to legs).  Neurological: He is alert and oriented to person, place, and time. No cranial nerve deficit. He exhibits normal muscle tone. Coordination normal.  Skin: Skin is warm and dry. No rash noted. He is not diaphoretic.  Psychiatric: He has a normal mood and affect. His behavior is normal.  Nursing note and vitals  reviewed.   ED Course  Procedures (including critical care time) Labs Review Labs Reviewed  CBC WITH DIFFERENTIAL - Abnormal; Notable for the following:    RBC 3.53 (*)    Hemoglobin 9.5 (*)    HCT 31.0 (*)    RDW 15.9 (*)    All other components within normal limits  BASIC METABOLIC PANEL -  Abnormal; Notable for the following:    Sodium 134 (*)    Glucose, Bld 57 (*)    BUN 41 (*)    Creatinine, Ser 4.12 (*)    GFR calc non Af Amer 14 (*)    GFR calc Af Amer 16 (*)    All other components within normal limits  BRAIN NATRIURETIC PEPTIDE - Abnormal; Notable for the following:    B Natriuretic Peptide 569.4 (*)    All other components within normal limits    Imaging Review Dg Chest 2 View  11/23/2014   CLINICAL DATA:  Shortness of breath and wheezing  EXAM: CHEST  2 VIEW  COMPARISON:  October 20, 2014  FINDINGS: The heart size is enlarged. The mediastinal contour is normal. There is mild chronic scarring of the left mid lung unchanged. There is mild chronic scarring with question minimal superimposed fluid in the minor fissure unchanged compared to prior exam. There is no pulmonary edema, focal pneumonia or pleural effusion. The visualized skeletal structures are stable.  IMPRESSION: Chronic changes of bilateral lungs stable since prior chest x-ray of October 20, 2014. There is no pulmonary edema or focal pneumonia. Cardiomegaly.   Electronically Signed   By: Sherian Rein M.D.   On: 11/23/2014 15:46   Dg Chest 2 View  11/23/2014   CLINICAL DATA:  Shortness of breath, wheezing x2 days  EXAM: CHEST - 2 VIEW  COMPARISON:  Earlier films of the same day, and previous studies  FINDINGS: Stable mild Chronic Linear scarring or subsegmental atelectasis in the lingula and superior segment right lower lobe . No focal airspace consolidation or overt edema. No effusion. Flowing osteophytes at multiple contiguous levels in the mid and lower thoracic spine.  IMPRESSION: 1. Stable mild cardiomegaly.   No acute or superimposed abnormality.   Electronically Signed   By: Oley Balm M.D.   On: 11/23/2014 15:44     EKG Interpretation None      MDM   Final diagnoses:  SOB (shortness of breath)  Weight gain  Acute renal failure, unspecified acute renal failure type  Acute dyspnea      The patient is a 64 y.o. M with CHF and COPD who presents with SOB and weight gain over the past two days. No cough or chest pain. Labs concerning for AKI with Creatinine elevated to 4. Patient admitted for further management.   Patient seen with attending, Dr. Jodi Mourning, who oversaw clinical decision making.   Lula Olszewski, MD 11/23/14 1610  Enid Skeens, MD 11/24/14 [phone]

## 2014-11-23 NOTE — ED Notes (Signed)
Marquita Palms [phone] (niece)

## 2014-11-23 NOTE — ED Notes (Signed)
Attempted report 

## 2014-11-23 NOTE — ED Notes (Signed)
Transporting patient to new room assignment. 

## 2014-11-23 NOTE — ED Notes (Signed)
Doctor at bedside.

## 2014-11-23 NOTE — ED Notes (Signed)
Onset last night shortness of breath continued today with weight gain approximately 20 pounds. EMS reported patient in a-fib with history of. Denies pain alert answering and following commands appropriate.

## 2014-11-23 NOTE — ED Notes (Addendum)
Family medicine at bedside.

## 2014-11-24 ENCOUNTER — Other Ambulatory Visit: Payer: Self-pay

## 2014-11-24 ENCOUNTER — Encounter (HOSPITAL_COMMUNITY): Payer: Self-pay | Admitting: Cardiology

## 2014-11-24 DIAGNOSIS — Z7901 Long term (current) use of anticoagulants: Secondary | ICD-10-CM

## 2014-11-24 DIAGNOSIS — R635 Abnormal weight gain: Secondary | ICD-10-CM | POA: Diagnosis present

## 2014-11-24 DIAGNOSIS — N179 Acute kidney failure, unspecified: Secondary | ICD-10-CM

## 2014-11-24 DIAGNOSIS — I428 Other cardiomyopathies: Secondary | ICD-10-CM

## 2014-11-24 DIAGNOSIS — R0602 Shortness of breath: Secondary | ICD-10-CM | POA: Diagnosis present

## 2014-11-24 LAB — GLUCOSE, CAPILLARY
Glucose-Capillary: 126 mg/dL — ABNORMAL HIGH (ref 70–99)
Glucose-Capillary: 112 mg/dL — ABNORMAL HIGH (ref 70–99)
Glucose-Capillary: 124 mg/dL — ABNORMAL HIGH (ref 70–99)

## 2014-11-24 LAB — CBC
HEMATOCRIT: 31.9 % — AB (ref 39.0–52.0)
Hemoglobin: 9.8 g/dL — ABNORMAL LOW (ref 13.0–17.0)
MCH: 26.5 pg (ref 26.0–34.0)
MCHC: 30.7 g/dL (ref 30.0–36.0)
MCV: 86.2 fL (ref 78.0–100.0)
Platelets: 207 10*3/uL (ref 150–400)
RBC: 3.7 MIL/uL — ABNORMAL LOW (ref 4.22–5.81)
RDW: 15.5 % (ref 11.5–15.5)
WBC: 7.7 10*3/uL (ref 4.0–10.5)

## 2014-11-24 LAB — BASIC METABOLIC PANEL
ANION GAP: 7 (ref 5–15)
BUN: 44 mg/dL — ABNORMAL HIGH (ref 6–23)
CALCIUM: 8.7 mg/dL (ref 8.4–10.5)
CO2: 28 mmol/L (ref 19–32)
CREATININE: 3.55 mg/dL — AB (ref 0.50–1.35)
Chloride: 97 mEq/L (ref 96–112)
GFR calc Af Amer: 19 mL/min — ABNORMAL LOW (ref >90)
GFR calc non Af Amer: 17 mL/min — ABNORMAL LOW (ref >90)
Glucose, Bld: 214 mg/dL — ABNORMAL HIGH (ref 70–99)
Potassium: 5.2 mmol/L — ABNORMAL HIGH (ref 3.5–5.1)
Sodium: 132 mmol/L — ABNORMAL LOW (ref 135–145)

## 2014-11-24 LAB — OCCULT BLOOD X 1 CARD TO LAB, STOOL: FECAL OCCULT BLD: POSITIVE — AB

## 2014-11-24 LAB — TSH: TSH: 1.192 u[IU]/mL (ref 0.350–4.500)

## 2014-11-24 LAB — TROPONIN I
Troponin I: 0.05 ng/mL — ABNORMAL HIGH (ref <0.031)
Troponin I: 0.05 ng/mL — ABNORMAL HIGH (ref <0.031)

## 2014-11-24 LAB — CLOSTRIDIUM DIFFICILE BY PCR: CDIFFPCR: NEGATIVE

## 2014-11-24 MED ORDER — INSULIN ASPART 100 UNIT/ML ~~LOC~~ SOLN: 0.0000 [IU] | Freq: Three times a day (TID) | SUBCUTANEOUS | Status: DC

## 2014-11-24 MED ORDER — FUROSEMIDE 10 MG/ML IJ SOLN
60.0000 mg | Freq: Two times a day (BID) | INTRAMUSCULAR | Status: DC
  Administered 2014-11-25 – 2014-11-26 (×3): 60 mg via INTRAVENOUS
  Filled 2014-11-24 (×5): qty 6

## 2014-11-24 MED ORDER — CETYLPYRIDINIUM CHLORIDE 0.05 % MT LIQD
7.0000 mL | Freq: Two times a day (BID) | OROMUCOSAL | Status: DC
  Administered 2014-11-24 – 2014-11-26 (×4): 7 mL via OROMUCOSAL

## 2014-11-24 MED ORDER — FUROSEMIDE 10 MG/ML IJ SOLN
60.0000 mg | Freq: Once | INTRAMUSCULAR | Status: AC
  Administered 2014-11-24: 60 mg via INTRAVENOUS
  Filled 2014-11-24: qty 6

## 2014-11-24 MED ORDER — FUROSEMIDE 10 MG/ML IJ SOLN
40.0000 mg | Freq: Two times a day (BID) | INTRAMUSCULAR | Status: DC
  Administered 2014-11-24 (×2): 40 mg via INTRAVENOUS
  Filled 2014-11-24 (×2): qty 4

## 2014-11-24 NOTE — Progress Notes (Signed)
Family Medicine Teaching Service Daily Progress Note Intern Pager: [phone]  Patient name: Frank Davidson Medical record number: [id number] Date of birth: 08/22/1951 Age: 64 y.o. Gender: male  Primary Care Provider: Abbe Amsterdam, MD Consultants: Cardiology Code Status: Full Code  Pt Overview and Major Events to Date:  1/2 - Admit  Assessment and Plan: Frank Davidson is a 64 y.o. male presenting with weight gain, edema, fatigue, and dyspnea, found to have acute on chronic renal failure. PMH is significant for severe systolic chronic heart failure, atrial fibrillation, CVA, COPD, diabetes mellitus, stage III chronic kidney disease, OSA, gout.  # CHF exacerbation: Symptoms with which patient presents are consistent with CHF exacerbation. Patient reports weight is up approximately 20 pounds over the last 2 days. Has 3+ lower extremity edema bilaterally. BNP elevated. No obvious edema on chest x-ray. Last EF was around 15%. Patient is followed by the heart failure clinic here at [facility] 258 on admission (Last HF office visit was 242) -Lasix IV 40 mg BID; -Strict intake and output, daily weights -Continue home Imdur, carvedilol so as to not precipitate unstable angina -Hold spironolactone and lisinopril given acute kidney injury and soft blood pressures -Contacting Cardiology today (hopefully heart failure team will be able to see soon).  # Acute on chronic renal failure: Baseline creatinine 1.5-2. Creatinine on admission 4.5. This is most likely related to low output heart failure and renal hypoperfusion.  Patient followed by Nephrologist Dr. Briant Cedar. -Lasix as above -Creatinine improving - 3.55 this am. -Will continue to monitor.  -Consulting renal today.   # Diarrhea, Hematochezia (per nursing report). - C diff pending. Positive FOBT. - Will monitor Hgb w/ daily CBC. - Holding Eliquis (see below).  # COPD -No current evidence of exacerbation. -Will continue to  monitor.  # Atrial fibrillation: Currently rate controlled.  -Currently on Eliquis. -Now with GI bleed; Will discontinue and discuss with cardiology.  # Gout:  -Holding allopurinol in setting of AKI  # Chronic pain: -Continue home oxycodone  FEN/GI: Saline lock IV, and diuresing as above, renal diet with fluid restriction Prophylaxis: SCD's.  Disposition: Pending improvement in creatinine and volume status.  Subjective:  Reports L leg pain today. No reported SOB or chest pain.  Objective: Temp:  [97.8 F (36.6 C)-98.6 F (37 C)] 98.1 F (36.7 C) (01/03 0926) Pulse Rate:  [45-87] 83 (01/03 0926) Resp:  [12-23] 20 (01/03 0926) BP: (95-144)/(50-108) 112/50 mmHg (01/03 0926) SpO2:  [92 %-100 %] 98 % (01/03 0926) Weight:  [250 lb (113.4 kg)-258 lb (117.028 kg)] 250 lb (113.4 kg) (01/03 0448) Physical Exam: General: obese, resting in bed, NAD.  Cardiovascular: RRR. No murmurs, rubs, or gallops. Respiratory: CTAB. No rales, rhonchi, or wheeze. Abdomen: soft, nontender, nondistended. Extremities: 2+ pitting LE edema.  Laboratory:  Recent Labs Lab 11/20/14 1113 11/23/14 1719 11/24/14 0315  WBC 6.9 8.0 7.7  HGB 10.9* 9.5* 9.8*  HCT 34.1* 31.0* 31.9*  PLT  --  197 207    Recent Labs Lab 11/23/14 1719 11/24/14 0315  NA 134* 132*  K 4.6 5.2*  CL 99 97  CO2 25 28  BUN 41* 44*  CREATININE 4.12* 3.55*  CALCIUM 8.8 8.7  GLUCOSE 57* 214*   Imaging/Diagnostic Tests:  Dg Chest 2 View 11/23/2014   IMPRESSION: Chronic changes of bilateral lungs stable since prior chest x-ray of October 20, 2014. There is no pulmonary edema or focal pneumonia. Cardiomegaly.    Dg Chest 2 View 11/23/2014   IMPRESSION:  1. Stable mild cardiomegaly.  No acute or superimposed abnormality.   Tommie Sams, DO 11/24/2014, 9:50 AM PGY-3, [facility] FPTS Intern pager: [phone], text pages welcome

## 2014-11-24 NOTE — Progress Notes (Signed)
MD on call notified as patient had bloody BM.  BM appeared brown but toilet water turned red.  No additional orders at this time.  Will continue to monitor.

## 2014-11-24 NOTE — Progress Notes (Signed)
MD on call text paged that patient positive for fecal occult blood.  C. Diff still pending.  Will await further orders and continue to monitor.

## 2014-11-24 NOTE — Care Management Note (Addendum)
    Page 1 of 1   11/26/2014     5:32:19 PM CARE MANAGEMENT NOTE 11/26/2014  Patient:  Frank Davidson, Frank Davidson   Account Number:  [id number]  Date Initiated:  11/24/2014  Documentation initiated by:  [facility]  Subjective/Objective Assessment:   adm: weight gain, fatigue, dyspnea     Action/Plan:   discharge planning   Anticipated DC Date:  11/25/2014   Anticipated DC Plan:  [place]  CM consult      PAC Choice  NA   Choice offered to / List presented to:             [place]   Status of service:  Completed, signed off Medicare Important Message given?  YES (If response is "NO", the following Medicare IM given date fields will be blank) Date Medicare IM given:  11/25/2014 Medicare IM given by:  Frank Davidson Date Additional Medicare IM given:   Additional Medicare IM given by:    Discharge Disposition:  HOME/SELF CARE  Per UR Regulation:  Reviewed for med. necessity/level of care/duration of stay  If discussed at [place] of Stay Meetings, dates discussed:    Comments:  Frank Benegas RN, BSN, MSHL, CCM  Nurse - Case Manager,  ([facility] [place]) [phone]  11/26/2014 From home with family. [facility]:  active HF Clinic:  active  11/24/14 08:30 Cm met with pt in room to verify his insurance is [employer].  Pt confirms and unfortunately, pt is not elligible for [facility] program.  Pt states he has already reapplied for Medicaid but has not yet been approved.  Pt is followed by both the Heart clinic and [place].   No other CM needs were communicated.  Frank Davidson, BSN, CM [phone].

## 2014-11-24 NOTE — Consult Note (Signed)
Reason for Consult:   CHF  Requesting Physician: Family Medicine  HPI: This is a 64 y.o. male with a past medical history significant for NICM with an EF of 15%, CRI-3 followed by Dr Briant Cedar, DM, obesity, OSA on C-pap, and PAF. He moved her from [place] in 2014. He is followed by Dr Gala Romney and the CHF service. He has declined an ICD in the past. He was unable to tolerate Entresto in Nov secondary to syncope. He says his usual wgt is 230-240. He was admitted 11/23/14 with dyspnea and wgt gain. His wgt on admission was 258. Also, his SCr was 4, baseline 1.2-1.5.  He denies any changes in his diet or medications.   PMHx:  Past Medical History  Diagnosis Date  . Atrial fibrillation     PAF  . Hypertension   . COPD (chronic obstructive pulmonary disease)   . Ocular herpes   . Gout   . CHF (congestive heart failure)   . Chronic pain   . Hyperlipidemia   . Nonischemic cardiomyopathy Aug 2015    EF 15%  . Ocular herpes   . Neuropathy   . Left-sided weakness     "because of the arthritis and edema"  . PVD (peripheral vascular disease) May 2015    abnormal ABIs  . Memory impairment     "short term"  . CKD (chronic kidney disease), stage III     Dr. Briant Cedar follows-holding   . Fibromyalgia     neuropathy- hands, more than feet.  . Sleep apnea with use of continuous positive airway pressure (CPAP)     does not use cpap  . GERD (gastroesophageal reflux disease)     tums as needed  . Asthma   . Type II diabetes mellitus   . Stroke X 5    last stroke aug 2014"; denies residual on 09/12/2014  . Osteoarthritis   . Arthritis     "knees, right elbow, shoulder" (09/12/2014)    Past Surgical History  Procedure Laterality Date  . 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 Tonsillectomy  age 18  . Esophagogastroduodenoscopy N/A 12/24/2013    Procedure: ESOPHAGOGASTRODUODENOSCOPY (EGD);  Surgeon: Louis Meckel, MD;  Location: [facility];  Service: Endoscopy;   Laterality: N/A;  . Colonoscopy N/A 12/24/2013    Procedure: COLONOSCOPY;  Surgeon: Louis Meckel, MD;  Location: [facility] ENDOSCOPY;  Service: Endoscopy;  Laterality: N/A;  . Joint replacement    . Cardiac catheterization  10/2006    normal coronary arteries;   Marland Kitchen Cataract extraction w/ intraocular lens  implant, bilateral Bilateral ~ 2008  . Total hip arthroplasty Left 09/12/2014    Procedure: LEFT TOTAL HIP ARTHROPLASTY ANTERIOR APPROACH;  Surgeon: [name], MD;  [facility];  Service: Orthopedics;  Laterality: Left;  . Right heart catheterization N/A 08/13/2014    Rt ht cath prior to hip surgery    SOCHx:  reports that he quit smoking about 7 years ago. His smoking use included Cigarettes. He has a 5 pack-year smoking history. He has never used smokeless tobacco. He reports that he uses illicit drugs (Cocaine and Marijuana). He reports that he does not drink alcohol.  FAMHx: Family History  Problem Relation Age of Onset  . Cancer Mother   . Hypertension Mother   . Lung disease Mother   . Diabetes Father   . Diabetes Sister   . Hypertension Sister   . Hypertension  Brother   . Hypertension Sister   . Colon cancer Neg Hx     ALLERGIES: Allergies  Allergen Reactions  . Corlanor [Ivabradine] Palpitations and Other (See Comments)    Kidney and heart problems, syncope  . Januvia [Sitagliptin] Other (See Comments)    "Almost killed me"   . Lunesta [Eszopiclone] Palpitations and Other (See Comments)    Kidney and heart problems, syncope  . Actos [Pioglitazone] Other (See Comments)    Caused blood in urine and fluid retention     ROS: Pertinent items are noted in HPI. See H&P for complete ROS. He did not tolerate Entresto in Nov 2015- syncope  HOME MEDICATIONS: Prior to Admission medications   Medication Sig Start Date End Date Taking? Authorizing Provider  allopurinol (ZYLOPRIM) 100 MG tablet Take 100 mg by mouth daily with breakfast. 11/01/13  Yes Baker Pierini,  FNP  apixaban (ELIQUIS) 5 MG TABS tablet Take 5 mg by mouth 2 (two) times daily. 11/01/13  Yes Baker Pierini, FNP  baclofen (LIORESAL) 10 MG tablet Take 10 mg by mouth every 8 (eight) hours as needed for muscle spasms.   Yes Historical Provider, MD  carvedilol (COREG) 25 MG tablet Take 12.5 mg by mouth 2 (two) times daily with a meal.    Yes Historical Provider, MD  ciprofloxacin (CIPRO) 500 MG tablet Take 1 tablet (500 mg total) by mouth 2 (two) times daily. Patient taking differently: Take 500 mg by mouth 2 (two) times daily. 10 day course started 11/16/14 11/16/14  Yes [name], MD  colchicine 0.6 MG tablet Take 1.2 mg by mouth daily as needed (Gout). Colcrys   Yes Historical Provider, MD  diclofenac (FLECTOR) 1.3 % PTCH Place 1 patch onto the skin 2 (two) times daily as needed (pain).   Yes Historical Provider, MD  docusate sodium 100 MG CAPS Take 100 mg by mouth 2 (two) times daily. 09/16/14  Yes Genelle Gather Babish, PA-C  glipiZIDE (GLUCOTROL) 5 MG tablet Take 5 mg by mouth 2 (two) times daily.   Yes Historical Provider, MD  Asencion Gowda 550 MG CAPS Take 1,100 mg by mouth 2 (two) times daily.   Yes Historical Provider, MD  hydrALAZINE (APRESOLINE) 50 MG tablet TAKE 1 TABLET (50 MG TOTAL) BY MOUTH 2 (TWO) TIMES DAILY. 11/04/14  Yes Dolores Patty, MD  isosorbide mononitrate (IMDUR) 60 MG 24 hr tablet TAKE 1 TABLET (60 MG TOTAL) BY MOUTH DAILY. 10/29/14  Yes Bevelyn Buckles Bensimhon, MD  lisinopril (PRINIVIL,ZESTRIL) 2.5 MG tablet Take 2.5 mg by mouth daily.  11/12/14  Yes Historical Provider, MD  meloxicam (MOBIC) 7.5 MG tablet Take 1 tablet (7.5 mg total) by mouth daily. 11/16/14  Yes [name], MD  metolazone (ZAROXOLYN) 5 MG tablet Take 5 mg by mouth daily as needed (Swelling).  05/13/14  Yes Amy D Clegg, NP  Misc Natural Products (TURMERIC CURCUMIN) CAPS Take 1 capsule by mouth 3 (three) times daily.   Yes Historical Provider, MD  ondansetron (ZOFRAN ODT) 4 MG  disintegrating tablet Take 1 tablet (4 mg total) by mouth every 8 (eight) hours as needed for nausea. 11/14/14  Yes Tomasita Crumble, MD  oxyCODONE-acetaminophen (PERCOCET) 10-325 MG per tablet Take 1 tablet by mouth 4 (four) times daily. scheduled 11/16/14  Yes Historical Provider, MD  polyethylene glycol powder (GLYCOLAX/MIRALAX) powder Take 17 g by mouth 2 (two) times daily as needed. Patient taking differently: Take 17 g by mouth 2 (two) times daily as needed (constipation).  11/16/14  Yes [name], MD  pregabalin (LYRICA) 75 MG capsule Take 75 mg by mouth daily.   Yes Historical Provider, MD  torsemide (DEMADEX) 20 MG tablet Take 20 mg by mouth 2 (two) times daily.   Yes Historical Provider, MD  vitamin B-12 (CYANOCOBALAMIN) 250 MCG tablet Take 250 mcg by mouth daily.   Yes Historical Provider, MD  vitamin C (ASCORBIC ACID) 500 MG tablet Take 1,000 mg by mouth daily.   Yes Historical Provider, MD  ferrous sulfate 325 (65 FE) MG tablet Take 1 tablet (325 mg total) by mouth 3 (three) times daily after meals. Patient not taking: Reported on 11/20/2014 09/16/14   Genelle Gather Babish, PA-C  HYDROcodone-acetaminophen (NORCO/VICODIN) 5-325 MG per tablet Take 1 tablet by mouth 2 (two) times daily as needed for severe pain. 11/14/14   Tomasita Crumble, MD  spironolactone (ALDACTONE) 25 MG tablet TAKE 1 TABLET (25 MG TOTAL) BY MOUTH DAILY. Patient not taking: Reported on 11/23/2014 10/31/14   Dolores Patty, MD    HOSPITAL MEDICATIONS: I have reviewed the patient's current medications.  VITALS: Blood pressure 112/50, pulse 83, temperature 98.1 F (36.7 C), temperature source Oral, resp. rate 20, height 5\' 10"  (1.778 m), weight 250 lb (113.4 kg), SpO2 98 %.  PHYSICAL EXAM: General appearance: alert, cooperative, no distress and morbidly obese Neck: no carotid bruit and no JVD Lungs: decreased overall  Heart: regular rate and rhythm Abdomen: obese, small umbilical hernia Extremities: 2+  edema Pulses: diminnished Skin: cool and dry Neurologic: Grossly normal  LABS: Results for orders placed or performed during the hospital encounter of 11/23/14 (from the past 24 hour(s))  CBC with Differential     Status: Abnormal   Collection Time: 11/23/14  5:19 PM  Result Value Ref Range   WBC 8.0 4.0 - 10.5 K/uL   RBC 3.53 (L) 4.22 - 5.81 MIL/uL   Hemoglobin 9.5 (L) 13.0 - 17.0 g/dL   HCT 16.1 (L) 09.6 - 04.5 %   MCV 87.8 78.0 - 100.0 fL   MCH 26.9 26.0 - 34.0 pg   MCHC 30.6 30.0 - 36.0 g/dL   RDW 40.9 (H) 81.1 - 91.4 %   Platelets 197 150 - 400 K/uL   Neutrophils Relative % 59 43 - 77 %   Neutro Abs 4.8 1.7 - 7.7 K/uL   Lymphocytes Relative 24 12 - 46 %   Lymphs Abs 1.9 0.7 - 4.0 K/uL   Monocytes Relative 12 3 - 12 %   Monocytes Absolute 0.9 0.1 - 1.0 K/uL   Eosinophils Relative 4 0 - 5 %   Eosinophils Absolute 0.3 0.0 - 0.7 K/uL   Basophils Relative 1 0 - 1 %   Basophils Absolute 0.1 0.0 - 0.1 K/uL  Basic metabolic panel     Status: Abnormal   Collection Time: 11/23/14  5:19 PM  Result Value Ref Range   Sodium 134 (L) 135 - 145 mmol/L   Potassium 4.6 3.5 - 5.1 mmol/L   Chloride 99 96 - 112 mEq/L   CO2 25 19 - 32 mmol/L   Glucose, Bld 57 (L) 70 - 99 mg/dL   BUN 41 (H) 6 - 23 mg/dL   Creatinine, Ser 7.82 (H) 0.50 - 1.35 mg/dL   Calcium 8.8 8.4 - 95.6 mg/dL   GFR calc non Af Amer 14 (L) >90 mL/min   GFR calc Af Amer 16 (L) >90 mL/min   Anion gap 10 5 - 15  Brain natriuretic peptide  Status: Abnormal   Collection Time: 11/23/14  5:19 PM  Result Value Ref Range   B Natriuretic Peptide 569.4 (H) 0.0 - 100.0 pg/mL  CBG monitoring, ED     Status: None   Collection Time: 11/23/14  8:42 PM  Result Value Ref Range   Glucose-Capillary 98 70 - 99 mg/dL  MRSA PCR Screening     Status: None   Collection Time: 11/23/14  9:47 PM  Result Value Ref Range   MRSA by PCR NEGATIVE NEGATIVE  Troponin I     Status: Abnormal   Collection Time: 11/23/14 10:30 PM  Result Value  Ref Range   Troponin I 0.06 (H) <0.031 ng/mL  TSH     Status: None   Collection Time: 11/23/14 10:30 PM  Result Value Ref Range   TSH 1.192 0.350 - 4.500 uIU/mL  Troponin I     Status: Abnormal   Collection Time: 11/24/14  3:15 AM  Result Value Ref Range   Troponin I 0.05 (H) <0.031 ng/mL  Basic metabolic panel     Status: Abnormal   Collection Time: 11/24/14  3:15 AM  Result Value Ref Range   Sodium 132 (L) 135 - 145 mmol/L   Potassium 5.2 (H) 3.5 - 5.1 mmol/L   Chloride 97 96 - 112 mEq/L   CO2 28 19 - 32 mmol/L   Glucose, Bld 214 (H) 70 - 99 mg/dL   BUN 44 (H) 6 - 23 mg/dL   Creatinine, Ser 9.60 (H) 0.50 - 1.35 mg/dL   Calcium 8.7 8.4 - 45.4 mg/dL   GFR calc non Af Amer 17 (L) >90 mL/min   GFR calc Af Amer 19 (L) >90 mL/min   Anion gap 7 5 - 15  CBC     Status: Abnormal   Collection Time: 11/24/14  3:15 AM  Result Value Ref Range   WBC 7.7 4.0 - 10.5 K/uL   RBC 3.70 (L) 4.22 - 5.81 MIL/uL   Hemoglobin 9.8 (L) 13.0 - 17.0 g/dL   HCT 09.8 (L) 11.9 - 14.7 %   MCV 86.2 78.0 - 100.0 fL   MCH 26.5 26.0 - 34.0 pg   MCHC 30.7 30.0 - 36.0 g/dL   RDW 82.9 56.2 - 13.0 %   Platelets 207 150 - 400 K/uL  Troponin I     Status: Abnormal   Collection Time: 11/24/14  9:24 AM  Result Value Ref Range   Troponin I 0.05 (H) <0.031 ng/mL  Occult blood card to lab, stool     Status: Abnormal   Collection Time: 11/24/14 10:43 AM  Result Value Ref Range   Fecal Occult Bld POSITIVE (A) NEGATIVE    EKG: NSR 1st degree AVB, PVCs  IMAGING:   Dg Chest 2 View  11/23/2014   CLINICAL DATA:  Shortness of breath, wheezing x2 days  EXAM: CHEST - 2 VIEW  COMPARISON:  Earlier films of the same day, and previous studies  FINDINGS: Stable mild Chronic Linear scarring or subsegmental atelectasis in the lingula and superior segment right lower lobe . No focal airspace consolidation or overt edema. No effusion. Flowing osteophytes at multiple contiguous levels in the mid and lower thoracic spine.   IMPRESSION: 1. Stable mild cardiomegaly.  No acute or superimposed abnormality.   Electronically Signed   By: Oley Balm M.D.   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 He is diuresing on current dose. Also noted to be in NSR today.   Time Spent Directly with Patient: 45 minutes  Abelino Derrick [phone] beeper 11/24/2014, 11:29 AM   Patient examined chart reviewed.  CRF/Chronic systolic heart failure with super imposed worsening cardiorenal syndrome.  At home he normally adds metazolone to his lasix for better diuresis but this has not helped.  Would let Dr Briant Cedar and renal dose his diuretics  It would appear to be hard to handle his CHF without dialysis Does not  Have a fistula in place and this needs to be discussed with renal.  I suspect he will end up in CCU on milrinone drip Currently comfortable and CHF team will see in am.  Indicates failing "experimental" drugs entresto and corlanor Exam remarkable for LE edema distant heart sounds obesity and small umbilical hernia   Charlton Haws

## 2014-11-24 NOTE — Progress Notes (Signed)
Family Practice Teaching Service Interval Progress Note  Troponin noted to be elevated at 0.06. I have called and spoken with on-call cardiologist Dr. Antoine Poche and explained pt's clinical situation. As this degree of troponin elevation is very nonspecific in this circumstance, he recommends against heparinizing patient at this time.  Will continue to trend troponins and plan for formal cardiology consult in the morning.  Levert Feinstein, MD Family Medicine PGY-3 Service Pager [phone]

## 2014-11-24 NOTE — Progress Notes (Signed)
MD on call text paged as patient has history of diabetes but no CBGs or SSI currently ordered.  Spot check CBG 126.  CBG checks ordered.  Patient updated, will continue to monitor.

## 2014-11-24 NOTE — Consult Note (Signed)
Renal Service Consult Note [facility]  BARNIE SHALHOUB 11/24/2014 Delano Metz D Requesting Physician:  Dr Randolm Idol  Reason for Consult:  Acute on CRF in pt with severe heart failure HPI: The patient is a 64 y.o. year-old with hx of HTN, DM, NICM and CKD 3 f/b Dr Briant Cedar.  Presented with SOB, wt gain of 20 lbs and leg edema yesterday.  Creat was up from baseline 1.4 to 4.0.  Diuresing and wt down 4 kg today. Creat down to 3.5 today. Pt no complaints.    Chart review: 8/14 - 64 yo male hx afib, HTN, DM, NICM EF 25%, CKD3 sent to ED from SNF for acute L-sided weakness and facial droop.  CT head showed R insular infarct, mod to large sized. Residual L weakness, continued on ASA 325 daily. Started on coumadin w hx afib and high CHADS2 score of 5.  Stop asa when INR 2-3. ECHO showed EF 20-25%, afib, NICM, CKD w creat 1.4, COPD, DM, gout, obesity 10/14 - 5 day hx SOB, orthopnea, wt gain > found to have acute CHF rx with IV lasix. dc'd on po lasix 80 bid, EF 20%.  11/28 - 10/20/14 > two episodes of syncope and aphasia at home. Just started new meds including Entresto and Corlanor prior to these problems. Entresto/Corlanor were stopped on admission. Head CT was neg. ACEi and lasix held for 2 days for vol depletion concern. DC'd.    ROS  no rash  no HA  no nsaids  Past Medical History  Past Medical History  Diagnosis Date  . Atrial fibrillation     PAF  . Hypertension   . COPD (chronic obstructive pulmonary disease)   . Ocular herpes   . Gout   . CHF (congestive heart failure)   . Chronic pain   . Hyperlipidemia   . Nonischemic cardiomyopathy Aug 2015    EF 15%  . Ocular herpes   . Neuropathy   . Left-sided weakness     "because of the arthritis and edema"  . PVD (peripheral vascular disease) May 2015    abnormal ABIs  . Memory impairment     "short term"  . CKD (chronic kidney disease), stage III     Dr. Briant Cedar follows-holding   . Fibromyalgia    neuropathy- hands, more than feet.  . Sleep apnea with use of continuous positive airway pressure (CPAP)     does not use cpap  . GERD (gastroesophageal reflux disease)     tums as needed  . Asthma   . Type II diabetes mellitus   . Stroke X 5    last stroke aug 2014"; denies residual on 09/12/2014  . Osteoarthritis   . Arthritis     "knees, right elbow, shoulder" (09/12/2014)   Past Surgical History  Past Surgical History  Procedure Laterality Date  . 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 Tonsillectomy  age 61  . Esophagogastroduodenoscopy N/A 12/24/2013    Procedure: ESOPHAGOGASTRODUODENOSCOPY (EGD);  Surgeon: Louis Meckel, MD;  Location: [facility];  Service: Endoscopy;  Laterality: N/A;  . Colonoscopy N/A 12/24/2013    Procedure: COLONOSCOPY;  Surgeon: Louis Meckel, MD;  Location: [facility];  Service: Endoscopy;  Laterality: N/A;  . Joint replacement    . Cardiac catheterization  10/2006    normal coronary arteries;   Marland Kitchen Cataract extraction w/ intraocular lens  implant, bilateral Bilateral ~ 2008  . Total hip arthroplasty Left 09/12/2014  Procedure: LEFT TOTAL HIP ARTHROPLASTY ANTERIOR APPROACH;  Surgeon: [name], MD;  Location: [facility] OR;  Service: Orthopedics;  Laterality: Left;  . Right heart catheterization N/A 08/13/2014    Rt ht cath prior to hip surgery   Family History  Family History  Problem Relation Age of Onset  . Cancer Mother   . Hypertension Mother   . Lung disease Mother   . Diabetes Father   . Diabetes Sister   . Hypertension Sister   . Hypertension Brother   . Hypertension Sister   . Colon cancer Neg Hx    Social History  reports that he quit smoking about 7 years ago. His smoking use included Cigarettes. He has a 5 pack-year smoking history. He has never used smokeless tobacco. He reports that he uses illicit drugs (Cocaine and Marijuana). He reports that he does not drink alcohol. Allergies  Allergies  Allergen Reactions  . Corlanor  [Ivabradine] Palpitations and Other (See Comments)    Kidney and heart problems, syncope  . Januvia [Sitagliptin] Other (See Comments)    "Almost killed me"   . Lunesta [Eszopiclone] Palpitations and Other (See Comments)    Kidney and heart problems, syncope  . Actos [Pioglitazone] Other (See Comments)    Caused blood in urine and fluid retention    Home medications Prior to Admission medications   Medication Sig Start Date End Date Taking? Authorizing Provider  allopurinol (ZYLOPRIM) 100 MG tablet Take 100 mg by mouth daily with breakfast. 11/01/13  Yes Baker Pierini, FNP  apixaban (ELIQUIS) 5 MG TABS tablet Take 5 mg by mouth 2 (two) times daily. 11/01/13  Yes Baker Pierini, FNP  baclofen (LIORESAL) 10 MG tablet Take 10 mg by mouth every 8 (eight) hours as needed for muscle spasms.   Yes Historical Provider, MD  carvedilol (COREG) 25 MG tablet Take 12.5 mg by mouth 2 (two) times daily with a meal.    Yes Historical Provider, MD  ciprofloxacin (CIPRO) 500 MG tablet Take 1 tablet (500 mg total) by mouth 2 (two) times daily. Patient taking differently: Take 500 mg by mouth 2 (two) times daily. 10 day course started 11/16/14 11/16/14  Yes [name], MD  colchicine 0.6 MG tablet Take 1.2 mg by mouth daily as needed (Gout). Colcrys   Yes Historical Provider, MD  diclofenac (FLECTOR) 1.3 % PTCH Place 1 patch onto the skin 2 (two) times daily as needed (pain).   Yes Historical Provider, MD  docusate sodium 100 MG CAPS Take 100 mg by mouth 2 (two) times daily. 09/16/14  Yes Genelle Gather Babish, PA-C  glipiZIDE (GLUCOTROL) 5 MG tablet Take 5 mg by mouth 2 (two) times daily.   Yes Historical Provider, MD  Asencion Gowda 550 MG CAPS Take 1,100 mg by mouth 2 (two) times daily.   Yes Historical Provider, MD  hydrALAZINE (APRESOLINE) 50 MG tablet TAKE 1 TABLET (50 MG TOTAL) BY MOUTH 2 (TWO) TIMES DAILY. 11/04/14  Yes Dolores Patty, MD  isosorbide mononitrate (IMDUR) 60 MG 24 hr  tablet TAKE 1 TABLET (60 MG TOTAL) BY MOUTH DAILY. 10/29/14  Yes Bevelyn Buckles Bensimhon, MD  lisinopril (PRINIVIL,ZESTRIL) 2.5 MG tablet Take 2.5 mg by mouth daily.  11/12/14  Yes Historical Provider, MD  meloxicam (MOBIC) 7.5 MG tablet Take 1 tablet (7.5 mg total) by mouth daily. 11/16/14  Yes [name], MD  metolazone (ZAROXOLYN) 5 MG tablet Take 5 mg by mouth daily as needed (Swelling).  05/13/14  Yes Amy  Georgie Chard, NP  Misc Natural Products (TURMERIC CURCUMIN) CAPS Take 1 capsule by mouth 3 (three) times daily.   Yes Historical Provider, MD  ondansetron (ZOFRAN ODT) 4 MG disintegrating tablet Take 1 tablet (4 mg total) by mouth every 8 (eight) hours as needed for nausea. 11/14/14  Yes Tomasita Crumble, MD  oxyCODONE-acetaminophen (PERCOCET) 10-325 MG per tablet Take 1 tablet by mouth 4 (four) times daily. scheduled 11/16/14  Yes Historical Provider, MD  polyethylene glycol powder (GLYCOLAX/MIRALAX) powder Take 17 g by mouth 2 (two) times daily as needed. Patient taking differently: Take 17 g by mouth 2 (two) times daily as needed (constipation).  11/16/14  Yes [name], MD  pregabalin (LYRICA) 75 MG capsule Take 75 mg by mouth daily.   Yes Historical Provider, MD  torsemide (DEMADEX) 20 MG tablet Take 20 mg by mouth 2 (two) times daily.   Yes Historical Provider, MD  vitamin B-12 (CYANOCOBALAMIN) 250 MCG tablet Take 250 mcg by mouth daily.   Yes Historical Provider, MD  vitamin C (ASCORBIC ACID) 500 MG tablet Take 1,000 mg by mouth daily.   Yes Historical Provider, MD  ferrous sulfate 325 (65 FE) MG tablet Take 1 tablet (325 mg total) by mouth 3 (three) times daily after meals. Patient not taking: Reported on 11/20/2014 09/16/14   Genelle Gather Babish, PA-C  HYDROcodone-acetaminophen (NORCO/VICODIN) 5-325 MG per tablet Take 1 tablet by mouth 2 (two) times daily as needed for severe pain. 11/14/14   Tomasita Crumble, MD  spironolactone (ALDACTONE) 25 MG tablet TAKE 1 TABLET (25 MG TOTAL) BY MOUTH  DAILY. Patient not taking: Reported on 11/23/2014 10/31/14   Dolores Patty, MD   Liver Function Tests No results for input(s): AST, ALT, ALKPHOS, BILITOT, PROT, ALBUMIN in the last 168 hours. No results for input(s): LIPASE, AMYLASE in the last 168 hours. CBC  Recent Labs Lab 11/20/14 1113 11/23/14 1719 11/24/14 0315  WBC 6.9 8.0 7.7  NEUTROABS  --  4.8  --   HGB 10.9* 9.5* 9.8*  HCT 34.1* 31.0* 31.9*  MCV 88.3 87.8 86.2  PLT  --  197 207   Basic Metabolic Panel  Recent Labs Lab 11/23/14 1719 11/24/14 0315  NA 134* 132*  K 4.6 5.2*  CL 99 97  CO2 25 28  GLUCOSE 57* 214*  BUN 41* 44*  CREATININE 4.12* 3.55*  CALCIUM 8.8 8.7    Filed Vitals:   11/24/14 0448 11/24/14 0926 11/24/14 1400 11/24/14 1713  BP: 105/70 112/50 98/49 111/63  Pulse: 67 83 74 63  Temp: 97.8 F (36.6 C) 98.1 F (36.7 C) 98.1 F (36.7 C)   TempSrc: Oral Oral Oral   Resp: Height:      Weight: 113.4 kg (250 lb)     SpO2: 100% 98% 98% 99%   Exam Alert, awake, no distress No rash, cyanosis or gangrene Sclera anicteric, throat clear +JVD Chest occ basilar rales bilat RRR no MRG ABd obese, soft, NTND 2-3+ LE edema pretib bilat Neuro is nf, Ox 3, no asterixis  CXR stable CM, no acute disease  Assessment: 1. Acute on CKD3 - due to decomp CHF.  Improving with diuresis.  2. NICM EF 20 % 3. DM2 4. Afib 5. Hx CVA 2014 w L sided weakness 6. OA 7. COPD 8. Chronic pain   Plan- increase IV lasix slightly to 60 IV bid and give extra dose Barbaraann Barthel MD (pgr) [phone]    ([id number] 11/24/2014,  7:42 PM

## 2014-11-24 NOTE — Progress Notes (Signed)
Patient has had three soft to loose stools in the last 24 hours.  Placed on contact-enteric precautions to rule out C. Diff.  Sample and hemocult sent to lab.  Patient updated.  Will await results and continue to monitor.

## 2014-11-25 DIAGNOSIS — I5023 Acute on chronic systolic (congestive) heart failure: Principal | ICD-10-CM

## 2014-11-25 LAB — RENAL FUNCTION PANEL
ALBUMIN: 3.8 g/dL (ref 3.5–5.2)
ANION GAP: 9 (ref 5–15)
BUN: 47 mg/dL — AB (ref 6–23)
CALCIUM: 9 mg/dL (ref 8.4–10.5)
CO2: 29 mmol/L (ref 19–32)
Chloride: 98 mEq/L (ref 96–112)
Creatinine, Ser: 2.49 mg/dL — ABNORMAL HIGH (ref 0.50–1.35)
GFR calc Af Amer: 30 mL/min — ABNORMAL LOW (ref >90)
GFR calc non Af Amer: 26 mL/min — ABNORMAL LOW (ref >90)
Glucose, Bld: 110 mg/dL — ABNORMAL HIGH (ref 70–99)
PHOSPHORUS: 3.9 mg/dL (ref 2.3–4.6)
Potassium: 4.3 mmol/L (ref 3.5–5.1)
Sodium: 136 mmol/L (ref 135–145)

## 2014-11-25 LAB — BASIC METABOLIC PANEL

## 2014-11-25 LAB — GLUCOSE, CAPILLARY
GLUCOSE-CAPILLARY: 122 mg/dL — AB (ref 70–99)
Glucose-Capillary: 126 mg/dL — ABNORMAL HIGH (ref 70–99)
Glucose-Capillary: 171 mg/dL — ABNORMAL HIGH (ref 70–99)
Glucose-Capillary: 115 mg/dL — ABNORMAL HIGH (ref 70–99)

## 2014-11-25 LAB — CBC
HCT: 31.1 % — ABNORMAL LOW (ref 39.0–52.0)
HEMOGLOBIN: 10 g/dL — AB (ref 13.0–17.0)
MCH: 27.2 pg (ref 26.0–34.0)
MCHC: 32.2 g/dL (ref 30.0–36.0)
MCV: 84.5 fL (ref 78.0–100.0)
Platelets: 208 10*3/uL (ref 150–400)
RBC: 3.68 MIL/uL — ABNORMAL LOW (ref 4.22–5.81)
RDW: 15.5 % (ref 11.5–15.5)
WBC: 11.9 10*3/uL — AB (ref 4.0–10.5)

## 2014-11-25 LAB — BRAIN NATRIURETIC PEPTIDE

## 2014-11-25 MED ORDER — APIXABAN 5 MG PO TABS
5.0000 mg | ORAL_TABLET | Freq: Two times a day (BID) | ORAL | Status: DC
  Administered 2014-11-25 – 2014-11-26 (×2): 5 mg via ORAL
  Filled 2014-11-25 (×4): qty 1

## 2014-11-25 NOTE — Progress Notes (Signed)
UR completed [place] K. Favor Hackler, RN, BSN, MSHL, CCM  11/25/2014 12:07 PM

## 2014-11-25 NOTE — Progress Notes (Signed)
Family Medicine Teaching Service Daily Progress Note Intern Pager: [phone]  Patient name: Frank Davidson Medical record number: [id number] Date of birth: 02-19-1951 Age: 64 y.o. Gender: male  Primary Care Provider: Abbe Amsterdam, MD Consultants: Cardiology Code Status: Full Code  Pt Overview and Major Events to Date:  1/2 - Admitted for CHF exacerbation and AKI.  Assessment and Plan: Frank Davidson is a 64 y.o. male presenting with weight gain, edema, fatigue, and dyspnea, found to have acute on chronic renal failure. PMH is significant for severe systolic chronic heart failure, atrial fibrillation, CVA, COPD, diabetes mellitus, stage III chronic kidney disease, OSA, gout.  # CHF exacerbation: Symptoms with which patient presents are consistent with CHF exacerbation. Had 3+ lower extremity edema bilaterally on admission. BNP elevated. No obvious edema on chest x-ray. Last EF was around 15%. Patient is followed by the heart failure clinic here at [facility]. Patient with improved fluid status.  -Wt 258 on admission (Last HF office visit was 242) - currently 239 -Lasix IV 60 mg BID; -Strict intake and output, daily weights -Continue home Imdur, carvedilol so as to not precipitate unstable angina -Hold spironolactone and lisinopril given acute kidney injury and soft blood pressures - will wean patient off of O2; currently at 2L. Does not have oxygen at home.  [facility] again today   # Acute on chronic renal failure: Baseline creatinine 1.5-2. Creatinine on admission 4.5. This is most likely related to low output heart failure and renal hypoperfusion.  Patient followed by Nephrologist Dr. Briant Cedar. Responding to diuetrics.  -Lasix as above -Creatinine improving - 2.49 -Will continue to monitor.  -will monitor his K levels  -f/u renal recommendations  # Diarrhea, Hematochezia (per nursing report). Patient had colonoscopy with signs of internal hemrrhoids and  diverticular disease.  - C diff negative. Positive FOBT. - Will monitor Hgb w/ daily CBC. - Holding Eliquis (see below).  -will consider GI consult if worsens  # COPD -No current evidence of exacerbation. -Will continue to monitor.  # Atrial fibrillation: Currently rate controlled.  -Currently holding Eliquis in setting of +FOBT. -Now with GI bleed; Will discontinue and discuss with cardiology.  # Gout:  -Holding allopurinol in setting of AKI  # Chronic pain: -Continue home oxycodone  FEN/GI: Saline lock IV, and diuresing as above, renal diet with fluid restriction Prophylaxis: SCD's.  Disposition: Pending improvement in creatinine and volume status. Wean off oxygen.   Subjective:  No overnight events. No concerns this AM. Patient says his swelling is much improved. No reported SOB or chest pain.  Objective: Temp:  [98 F (36.7 C)-98.4 F (36.9 C)] 98 F (36.7 C) (01/04 0550) Pulse Rate:  [63-83] 75 (01/04 0550) Resp:  [18-20] 20 (01/04 0550) BP: (98-126)/(49-78) 126/78 mmHg (01/04 0550) SpO2:  [98 %-100 %] 99 % (01/04 0550) Weight:  [239 lb 10.2 oz (108.7 kg)] 239 lb 10.2 oz (108.7 kg) (01/04 0550) Physical Exam: General: obese, resting in bed, NAD. [place] in place. Cardiovascular: RRR. No murmurs, rubs, or gallops. Respiratory: CTAB. No rales, rhonchi, or wheeze. Decreased air movement and breath sounds. Abdomen: soft, nontender, nondistended. Extremities: trace leg edema.  Laboratory:  Recent Labs Lab 11/23/14 1719 11/24/14 0315 11/25/14 0425  WBC 8.0 7.7 11.9*  HGB 9.5* 9.8* 10.0*  HCT 31.0* 31.9* 31.1*  PLT 197 207 208    Recent Labs Lab 11/23/14 1719 11/24/14 0315 11/25/14 0425  NA 134* 132* 136  K 4.6 5.2* 4.3  CL 99 97 98  CO2 BUN 41* 44* 47*  CREATININE 4.12* 3.55* 2.49*  CALCIUM 8.8 8.7 9.0  GLUCOSE 57* 214* 110*   Imaging/Diagnostic Tests:  Dg Chest 2 View 11/23/2014   IMPRESSION: Chronic changes of bilateral lungs stable since  prior chest x-ray of October 20, 2014. There is no pulmonary edema or focal pneumonia. Cardiomegaly.    Dg Chest 2 View 11/23/2014   IMPRESSION: 1. Stable mild cardiomegaly.  No acute or superimposed abnormality.   Pincus Large, DO 11/25/2014, 6:06 AM PGY-1,  Family Medicine FPTS Intern pager: [phone], text pages welcome

## 2014-11-25 NOTE — Progress Notes (Signed)
Patient ID: Frank Davidson, male   DOB: 01/17/1951, 64 y.o.   MRN: [id number]  [facility] Progress Note    Assessment/ Plan:   1. AKI on CKD stage III: With excellent response to diuretics and now improvement of renal function tests CHF decompensation improves. No acute dialysis needs noted, agree with continued diuretic therapy-intravenous furosemide 60 mg twice a day-the patient reports that he was taking torsemide 20 mg twice a day prior to admission together with spironolactone and only recently started metolazone. Meloxicam and lisinopril currently on hold. Anticipate conversion to oral diuretic therapy either today or tomorrow in order to facilitate discharge thereafter. 2. Acute exacerbation of heart failure-with evidence of nonischemic cardiomyopathy/systolic heart failure-EF 20% 3. Anemia: No overt losses, hemoglobin barely at goal. 4. Hypertension: Blood pressure is fairly controlled with ongoing diuresis,  Subjective:   Reports to be comfortable, no acute events overnight    Objective:   BP 126/78 mmHg  Pulse 75  Temp(Src) 98 F (36.7 C) (Oral)  Resp 20  Ht  (1.778 m)  Wt 108.7 kg (239 lb 10.2 oz)  BMI 34.38 kg/m2  SpO2 99%  Intake/Output Summary (Last 24 hours) at 11/25/14 4098 Last data filed at 11/25/14 1191  Gross per 24 hour  Intake   1228 ml  Output   2900 ml  Net  -1672 ml   Weight change: -8.328 kg (-18 lb 5.8 oz)  Physical Exam: Gen: Comfortably resting in bed, watching television CVS: Pulse regular in rate and rhythm, S1 and S2 normal Resp: Decreased breath sounds over bases otherwise clear Abd: Soft, obese, nontender Ext: 1-2+ lower extremity edema  Imaging: Dg Chest 2 View  11/23/2014   CLINICAL DATA:  Shortness of breath and wheezing  EXAM: CHEST  2 VIEW  COMPARISON:  October 20, 2014  FINDINGS: The heart size is enlarged. The mediastinal contour is normal. There is mild chronic scarring of the left mid lung unchanged. There  is mild chronic scarring with question minimal superimposed fluid in the minor fissure unchanged compared to prior exam. There is no pulmonary edema, focal pneumonia or pleural effusion. The visualized skeletal structures are stable.  IMPRESSION: Chronic changes of bilateral lungs stable since prior chest x-ray of October 20, 2014. There is no pulmonary edema or focal pneumonia. Cardiomegaly.   Electronically Signed   By: Sherian Rein M.D.   On: 11/23/2014 15:46   Dg Chest 2 View  11/23/2014   CLINICAL DATA:  Shortness of breath, wheezing x2 days  EXAM: CHEST - 2 VIEW  COMPARISON:  Earlier films of the same day, and previous studies  FINDINGS: Stable mild Chronic Linear scarring or subsegmental atelectasis in the lingula and superior segment right lower lobe . No focal airspace consolidation or overt edema. No effusion. Flowing osteophytes at multiple contiguous levels in the mid and lower thoracic spine.  IMPRESSION: 1. Stable mild cardiomegaly.  No acute or superimposed abnormality.   Electronically Signed   By: Oley Balm M.D.   On: 11/23/2014 15:44    Labs: BMET  Recent Labs Lab 11/23/14 1719 11/24/14 0315 11/25/14 0425  NA 134* 132* 136  K 4.6 5.2* 4.3  CL 99 97 98  CO2 GLUCOSE 57* 214* 110*  BUN 41* 44* 47*  CREATININE 4.12* 3.55* 2.49*  CALCIUM 8.8 8.7 9.0  PHOS  --   --  3.9   CBC  Recent Labs Lab 11/20/14 1113 11/23/14 1719 11/24/14 0315 11/25/14 0425  WBC 6.9 8.0 7.7 11.9*  NEUTROABS  --  4.8  --   --   HGB 10.9* 9.5* 9.8* 10.0*  HCT 34.1* 31.0* 31.9* 31.1*  MCV 88.3 87.8 86.2 84.5  PLT  --  197 207 208   Medications:    . antiseptic oral rinse  7 mL Mouth Rinse BID  . carvedilol  12.5 mg Oral BID WC  . docusate sodium  100 mg Oral BID  . furosemide  60 mg Intravenous BID  . isosorbide mononitrate  60 mg Oral Daily  . sodium chloride  3 mL Intravenous Q12H  . sodium chloride  3 mL Intravenous Q12H   Zetta Bills, MD 11/25/2014, 9:03 AM

## 2014-11-26 DIAGNOSIS — R0600 Dyspnea, unspecified: Secondary | ICD-10-CM

## 2014-11-26 DIAGNOSIS — I517 Cardiomegaly: Secondary | ICD-10-CM

## 2014-11-26 LAB — GLUCOSE, CAPILLARY
GLUCOSE-CAPILLARY: 125 mg/dL — AB (ref 70–99)
Glucose-Capillary: 105 mg/dL — ABNORMAL HIGH (ref 70–99)
Glucose-Capillary: 121 mg/dL — ABNORMAL HIGH (ref 70–99)

## 2014-11-26 LAB — CBC
HCT: 31.7 % — ABNORMAL LOW (ref 39.0–52.0)
HEMOGLOBIN: 10.4 g/dL — AB (ref 13.0–17.0)
MCH: 28 pg (ref 26.0–34.0)
MCHC: 32.8 g/dL (ref 30.0–36.0)
MCV: 85.2 fL (ref 78.0–100.0)
Platelets: 196 10*3/uL (ref 150–400)
RBC: 3.72 MIL/uL — AB (ref 4.22–5.81)
RDW: 15.5 % (ref 11.5–15.5)
WBC: 7.5 10*3/uL (ref 4.0–10.5)

## 2014-11-26 LAB — BASIC METABOLIC PANEL
Anion gap: 10 (ref 5–15)
BUN: 41 mg/dL — AB (ref 6–23)
CO2: 32 mmol/L (ref 19–32)
Calcium: 9.1 mg/dL (ref 8.4–10.5)
Chloride: 94 mEq/L — ABNORMAL LOW (ref 96–112)
Creatinine, Ser: 1.94 mg/dL — ABNORMAL HIGH (ref 0.50–1.35)
GFR calc Af Amer: 40 mL/min — ABNORMAL LOW (ref >90)
GFR calc non Af Amer: 35 mL/min — ABNORMAL LOW (ref >90)
Glucose, Bld: 123 mg/dL — ABNORMAL HIGH (ref 70–99)
POTASSIUM: 4 mmol/L (ref 3.5–5.1)
SODIUM: 136 mmol/L (ref 135–145)

## 2014-11-26 MED ORDER — MUSCLE RUB 10-15 % EX CREA: TOPICAL_CREAM | CUTANEOUS | Status: DC | PRN

## 2014-11-26 MED ORDER — MUSCLE RUB 10-15 % EX CREA: 1.0000 "application " | TOPICAL_CREAM | CUTANEOUS | Status: DC | PRN

## 2014-11-26 MED ORDER — TORSEMIDE 20 MG PO TABS: 40.0000 mg | ORAL_TABLET | Freq: Two times a day (BID) | ORAL | Status: DC

## 2014-11-26 MED ORDER — OXYCODONE-ACETAMINOPHEN 5-325 MG PO TABS
1.0000 | ORAL_TABLET | ORAL | Status: DC | PRN
  Administered 2014-11-26 (×3): 2 via ORAL
  Filled 2014-11-26 (×3): qty 2

## 2014-11-26 MED ORDER — TROLAMINE SALICYLATE 10 % EX CREA
TOPICAL_CREAM | Freq: Two times a day (BID) | CUTANEOUS | Status: DC | PRN
  Filled 2014-11-26: qty 85

## 2014-11-26 NOTE — Progress Notes (Signed)
1625 discharge instructions given . Verbalizes understanding. Awaiting transportation to go home.

## 2014-11-26 NOTE — Discharge Instructions (Signed)
Please increase your dose of Torsemide to 40mg  twice a day. Please follow up at the [facility] as scheduled. Also you need to schedule an appointment with your PCP. Make sure you get labs done. You will need a repeat CBC and BMET.   Heart Failure Heart failure is a condition in which the heart has trouble pumping blood. This means your heart does not pump blood efficiently for your body to work well. In some cases of heart failure, fluid may back up into your lungs or you may have swelling (edema) in your lower legs. Heart failure is usually a long-term (chronic) condition. It is important for you to take good care of yourself and follow your health care provider's treatment plan. CAUSES  Some health conditions can cause heart failure. Those health conditions include:  High blood pressure (hypertension). Hypertension causes the heart muscle to work harder than normal. When pressure in the blood vessels is high, the heart needs to pump (contract) with more force in order to circulate blood throughout the body. High blood pressure eventually causes the heart to become stiff and weak.  Coronary artery disease (CAD). CAD is the buildup of cholesterol and fat (plaque) in the arteries of the heart. The blockage in the arteries deprives the heart muscle of oxygen and blood. This can cause chest pain and may lead to a heart attack. High blood pressure can also contribute to CAD.  Heart attack (myocardial infarction). A heart attack occurs when one or more arteries in the heart become blocked. The loss of oxygen damages the muscle tissue of the heart. When this happens, part of the heart muscle dies. The injured tissue does not contract as well and weakens the heart's ability to pump blood.  Abnormal heart valves. When the heart valves do not open and close properly, it can cause heart failure. This makes the heart muscle pump harder to keep the blood flowing.  Heart muscle disease (cardiomyopathy or  myocarditis). Heart muscle disease is damage to the heart muscle from a variety of causes. These can include drug or alcohol abuse, infections, or unknown reasons. These can increase the risk of heart failure.  Lung disease. Lung disease makes the heart work harder because the lungs do not work properly. This can cause a strain on the heart, leading it to fail.  Diabetes. Diabetes increases the risk of heart failure. High blood sugar contributes to high fat (lipid) levels in the blood. Diabetes can also cause slow damage to tiny blood vessels that carry important nutrients to the heart muscle. When the heart does not get enough oxygen and food, it can cause the heart to become weak and stiff. This leads to a heart that does not contract efficiently.  Other conditions can contribute to heart failure. These include abnormal heart rhythms, thyroid problems, and low blood counts (anemia). Certain unhealthy behaviors can increase the risk of heart failure, including:  Being overweight.  Smoking or chewing tobacco.  Eating foods high in fat and cholesterol.  Abusing illicit drugs or alcohol.  Lacking physical activity. SYMPTOMS  Heart failure symptoms may vary and can be hard to detect. Symptoms may include:  Shortness of breath with activity, such as climbing stairs.  Persistent cough.  Swelling of the feet, ankles, legs, or abdomen.  Unexplained weight gain.  Difficulty breathing when lying flat (orthopnea).  Waking from sleep because of the need to sit up and get more air.  Rapid heartbeat.  Fatigue and loss of energy.  Feeling light-headed, dizzy, or close to fainting.  Loss of appetite.  Nausea.  Increased urination during the night (nocturia). DIAGNOSIS  A diagnosis of heart failure is based on your history, symptoms, physical examination, and diagnostic tests. Diagnostic tests for heart failure may include:  Echocardiography.  Electrocardiography.  Chest  X-ray.  Blood tests.  Exercise stress test.  Cardiac angiography.  Radionuclide scans. TREATMENT  Treatment is aimed at managing the symptoms of heart failure. Medicines, behavioral changes, or surgical intervention may be necessary to treat heart failure.  Medicines to help treat heart failure may include:  Angiotensin-converting enzyme (ACE) inhibitors. This type of medicine blocks the effects of a blood protein called angiotensin-converting enzyme. ACE inhibitors relax (dilate) the blood vessels and help lower blood pressure.  Angiotensin receptor blockers (ARBs). This type of medicine blocks the actions of a blood protein called angiotensin. Angiotensin receptor blockers dilate the blood vessels and help lower blood pressure.  Water pills (diuretics). Diuretics cause the kidneys to remove salt and water from the blood. The extra fluid is removed through urination. This loss of extra fluid lowers the volume of blood the heart pumps.  Beta blockers. These prevent the heart from beating too fast and improve heart muscle strength.  Digitalis. This increases the force of the heartbeat.  Healthy behavior changes include:  Obtaining and maintaining a healthy weight.  Stopping smoking or chewing tobacco.  Eating heart-healthy foods.  Limiting or avoiding alcohol.  Stopping illicit drug use.  Physical activity as directed by your health care provider.  Surgical treatment for heart failure may include:  A procedure to open blocked arteries, repair damaged heart valves, or remove damaged heart muscle tissue.  A pacemaker to improve heart muscle function and control certain abnormal heart rhythms.  An internal cardioverter defibrillator to treat certain serious abnormal heart rhythms.  A left ventricular assist device (LVAD) to assist the pumping ability of the heart. HOME CARE INSTRUCTIONS   Take medicines only as directed by your health care provider. Medicines are  important in reducing the workload of your heart, slowing the progression of heart failure, and improving your symptoms.  Do not stop taking your medicine unless directed by your health care provider.  Do not skip any dose of medicine.  Refill your prescriptions before you run out of medicine. Your medicines are needed every day.  Engage in moderate physical activity if directed by your health care provider. Moderate physical activity can benefit some people. The elderly and people with severe heart failure should consult with a health care provider for physical activity recommendations.  Eat heart-healthy foods. Food choices should be free of trans fat and low in saturated fat, cholesterol, and salt (sodium). Healthy choices include fresh or frozen fruits and vegetables, fish, lean meats, legumes, fat-free or low-fat dairy products, and whole grain or high fiber foods. Talk to a dietitian to learn more about heart-healthy foods.  Limit sodium if directed by your health care provider. Sodium restriction may reduce symptoms of heart failure in some people. Talk to a dietitian to learn more about heart-healthy seasonings.  Use healthy cooking methods. Healthy cooking methods include roasting, grilling, broiling, baking, poaching, steaming, or stir-frying. Talk to a dietitian to learn more about healthy cooking methods.  Limit fluids if directed by your health care provider. Fluid restriction may reduce symptoms of heart failure in some people.  Weigh yourself every day. Daily weights are important in the early recognition of excess fluid. You should weigh yourself every  morning after you urinate and before you eat breakfast. Wear the same amount of clothing each time you weigh yourself. Record your daily weight. Provide your health care provider with your weight record.  Monitor and record your blood pressure if directed by your health care provider.  Check your pulse if directed by your health  care provider.  Lose weight if directed by your health care provider. Weight loss may reduce symptoms of heart failure in some people.  Stop smoking or chewing tobacco. Nicotine makes your heart work harder by causing your blood vessels to constrict. Do not use nicotine gum or patches before talking to your health care provider.  Keep all follow-up visits as directed by your health care provider. This is important.  Limit alcohol intake to no more than 1 drink per day for nonpregnant women and 2 drinks per day for men. One drink equals 12 ounces of beer, 5 ounces of wine, or 1 ounces of hard liquor. Drinking more than that is harmful to your heart. Tell your health care provider if you drink alcohol several times a week. Talk with your health care provider about whether alcohol is safe for you. If your heart has already been damaged by alcohol or you have severe heart failure, drinking alcohol should be stopped completely.  Stop illicit drug use.  Stay up-to-date with immunizations. It is especially important to prevent respiratory infections through current pneumococcal and influenza immunizations.  Manage other health conditions such as hypertension, diabetes, thyroid disease, or abnormal heart rhythms as directed by your health care provider.  Learn to manage stress.  Plan rest periods when fatigued.  Learn strategies to manage high temperatures. If the weather is extremely hot:  Avoid vigorous physical activity.  Use air conditioning or fans or seek a cooler location.  Avoid caffeine and alcohol.  Wear loose-fitting, lightweight, and light-colored clothing.  Learn strategies to manage cold temperatures. If the weather is extremely cold:  Avoid vigorous physical activity.  Layer clothes.  Wear mittens or gloves, a hat, and a scarf when going outside.  Avoid alcohol.  Obtain ongoing education and support as needed.  Participate in or seek rehabilitation as needed to  maintain or improve independence and quality of life. SEEK MEDICAL CARE IF:   Your weight increases by 03 lb/1.4 kg in 1 day or 05 lb/2.3 kg in a week.  You have increasing shortness of breath that is unusual for you.  You are unable to participate in your usual physical activities.  You tire easily.  You cough more than normal, especially with physical activity.  You have any or more swelling in areas such as your hands, feet, ankles, or abdomen.  You are unable to sleep because it is hard to breathe.  You feel like your heart is beating fast (palpitations).  You become dizzy or light-headed upon standing up. SEEK IMMEDIATE MEDICAL CARE IF:   You have difficulty breathing.  There is a change in mental status such as decreased alertness or difficulty with concentration.  You have a pain or discomfort in your chest.  You have an episode of fainting (syncope). MAKE SURE YOU:   Understand these instructions.  Will watch your condition.  Will get help right away if you are not doing well or get worse. Document Released: 11/08/2005 Document Revised: 03/25/2014 Document Reviewed: 12/08/2012 [facility] Patient Information 2015 [place], [place]. This information is not intended to replace advice given to you by your health care provider. Make sure you discuss  any questions you have with your health care provider. ° °

## 2014-11-26 NOTE — Progress Notes (Signed)
Echocardiogram 2D Echocardiogram has been performed.  Frank Davidson 11/26/2014, 10:29 AM

## 2014-11-26 NOTE — Discharge Summary (Signed)
Family Medicine Teaching [facility] Discharge Summary  Patient name: Frank Davidson Medical record number: [id number] Date of birth: 1951-03-09 Age: 64 y.o. Gender: male Date of Admission: 11/23/2014  Date of Discharge: 11/26/13 Admitting Physician: Uvaldo Rising, MD  Primary Care Provider: Abbe Amsterdam, MD Consultants: Cardiology, Nephrology  Indication for Hospitalization: CHF Exacerbation  Discharge Diagnoses/Problem List:  CHF Exacerbation Acute on Chronic Renal Failure Diarrhea Chronic Pain COPD Atrial Fibrillation Gout  Disposition: Discharge Home  Discharge Condition: Stable  Brief Hospital Course:  Frank Davidson presented to the ED on 1/2 following weight gain of 20 pounds over 2 days and increased dyspnea and fatigue with exertion. BNP elevated to 569.4. CXR showed chronic changes of bilateral lungs stable from prior CXR with no signs of ulmonary edema or focal pneumonia; cardiomegaly was noted. IV Lasix given throughout hospitalization. Imdur and Carvedilol were continued, however spironolactone and lisinopril were held. Weight was noted to be 258 at admission, but trended down to 232 prior to discharge.  Cardiology consulted. Troponins elevated to 0.06 during hospitalization, but remained stable. Denied chest pain during hospitalization. Echo on 1/5 showed EF 20% and severe global hypokinesis. Slightly improved from Echocardiogram in 2014.  Creatinine noted to be 3.5 at admission. Nephrology consulted. Recommended continuation of diuresis. Creatinine trended down to  1.94, which was within the limits of his baseline creatinine, prior to discharge. Transitioned to oral diuretics on 11/26/13.  Nursing reported hematochezia in stool, however Frank Davidson denies. History of possible diverticulitis in weeks prior to presentation. FOBT positive. Hemoglobin stable throughout hospitalization. Eliquis continued with caution.  Issues for Follow Up:  - Weight 232 pounds at  discharge. Follow weight. - Discharged on Torsemide  BID - Recheck BMP and CBC - FOBT + with history of recent diverticulitis. Frank Davidson denies blood in stool. Eliquis continued with caution. Follow up bleeding.  Significant Procedures: None  Significant Labs and Imaging:   Recent Labs Lab 11/24/14 0315 11/25/14 0425 11/26/14 0716  WBC 7.7 11.9* 7.5  HGB 9.8* 10.0* 10.4*  HCT 31.9* 31.1* 31.7*  PLT 207 208 196    Recent Labs Lab 11/20/14 1030 11/23/14 1719 11/24/14 0315 11/25/14 0425 11/26/14 0329  NA CANCELED 134* 132* 136 136  K CANCELED 4.6 5.2* 4.3 4.0  CL CANCELED 99 97 98 94*  CO2 CANCELED 32  GLUCOSE CANCELED 57* 214* 110* 123*  BUN CANCELED 41* 44* 47* 41*  CREATININE CANCELED 4.12* 3.55* 2.49* 1.94*  CALCIUM CANCELED 8.8 8.7 9.0 9.1  PHOS  --   --   --  3.9  --   ALBUMIN  --   --   --  3.8  --   - Troponin 0.05 - BNP 569.4 - TSH 1.192 - C. Difficile negative - FOBT +  Dg Chest 2 View  11/23/2014   CLINICAL DATA:  Shortness of breath and wheezing  EXAM: CHEST  2 VIEW  COMPARISON:  October 20, 2014  FINDINGS: The heart size is enlarged. The mediastinal contour is normal. There is mild chronic scarring of the left mid lung unchanged. There is mild chronic scarring with question minimal superimposed fluid in the minor fissure unchanged compared to prior exam. There is no pulmonary edema, focal pneumonia or pleural effusion. The visualized skeletal structures are stable.  IMPRESSION: Chronic changes of bilateral lungs stable since prior chest x-ray of October 20, 2014. There is no pulmonary edema or focal pneumonia. Cardiomegaly.   Electronically Signed   By: Gabriel Carina.D.  On: 11/23/2014 15:46   Dg Chest 2 View  11/23/2014   CLINICAL DATA:  Shortness of breath, wheezing x2 days  EXAM: CHEST - 2 VIEW  COMPARISON:  Earlier films of the same day, and previous studies  FINDINGS: Stable mild Chronic Linear scarring or subsegmental atelectasis in  the lingula and superior segment right lower lobe . No focal airspace consolidation or overt edema. No effusion. Flowing osteophytes at multiple contiguous levels in the mid and lower thoracic spine.  IMPRESSION: 1. Stable mild cardiomegaly.  No acute or superimposed abnormality.   Electronically Signed   By: Oley Balm M.D.   On: 11/23/2014 15:44   Results/Tests Pending at Time of Discharge: None  Discharge Medications:    Medication List    STOP taking these medications        ferrous sulfate 325 (65 FE) MG tablet     hydrALAZINE 50 MG tablet  Commonly known as:  APRESOLINE     lisinopril 2.5 MG tablet  Commonly known as:  PRINIVIL,ZESTRIL     spironolactone 25 MG tablet  Commonly known as:  ALDACTONE      TAKE these medications        allopurinol 100 MG tablet  Commonly known as:  ZYLOPRIM  Take 100 mg by mouth daily with breakfast.     apixaban 5 MG Tabs tablet  Commonly known as:  ELIQUIS  Take 5 mg by mouth 2 (two) times daily.     baclofen 10 MG tablet  Commonly known as:  LIORESAL  Take 10 mg by mouth every 8 (eight) hours as needed for muscle spasms.     carvedilol 25 MG tablet  Commonly known as:  COREG  Take 12.5 mg by mouth 2 (two) times daily with a meal.     ciprofloxacin 500 MG tablet  Commonly known as:  CIPRO  Take 1 tablet (500 mg total) by mouth 2 (two) times daily.     colchicine 0.6 MG tablet  Take 1.2 mg by mouth daily as needed (Gout). Colcrys     diclofenac 1.3 % Ptch  Commonly known as:  FLECTOR  Place 1 patch onto the skin 2 (two) times daily as needed (pain).     DSS 100 MG Caps  Take 100 mg by mouth 2 (two) times daily.     glipiZIDE 5 MG tablet  Commonly known as:  GLUCOTROL  Take 5 mg by mouth 2 (two) times daily.     Hawthorne Berry 550 MG Caps  Take 1,100 mg by mouth 2 (two) times daily.     HYDROcodone-acetaminophen 5-325 MG per tablet  Commonly known as:  NORCO/VICODIN  Take 1 tablet by mouth 2 (two) times daily as  needed for severe pain.     isosorbide mononitrate 60 MG 24 hr tablet  Commonly known as:  IMDUR  TAKE 1 TABLET (60 MG TOTAL) BY MOUTH DAILY.     LYRICA 75 MG capsule  Generic drug:  pregabalin  Take 75 mg by mouth daily.     meloxicam 7.5 MG tablet  Commonly known as:  MOBIC  Take 1 tablet (7.5 mg total) by mouth daily.     metolazone 5 MG tablet  Commonly known as:  ZAROXOLYN  Take 5 mg by mouth daily as needed (Swelling).     MUSCLE RUB 10-15 % Crea  Apply 1 application topically as needed for muscle pain.     ondansetron 4 MG disintegrating tablet  Commonly known as:  ZOFRAN ODT  Take 1 tablet (4 mg total) by mouth every 8 (eight) hours as needed for nausea.     oxyCODONE-acetaminophen 10-325 MG per tablet  Commonly known as:  PERCOCET  Take 1 tablet by mouth 4 (four) times daily. scheduled     polyethylene glycol powder powder  Commonly known as:  GLYCOLAX/MIRALAX  Take 17 g by mouth 2 (two) times daily as needed.     torsemide 20 MG tablet  Commonly known as:  DEMADEX  Take 2 tablets (40 mg total) by mouth 2 (two) times daily.     Turmeric Curcumin Caps  Take 1 capsule by mouth 3 (three) times daily.     vitamin B-12 250 MCG tablet  Commonly known as:  CYANOCOBALAMIN  Take 250 mcg by mouth daily.     vitamin C 500 MG tablet  Commonly known as:  ASCORBIC ACID  Take 1,000 mg by mouth daily.        Discharge Instructions: Please refer to Patient Instructions section of EMR for full details.  Patient was counseled important signs and symptoms that should prompt return to medical care, changes in medications, dietary instructions, activity restrictions, and follow up appointments.   Follow-Up Appointments:     Follow-up Information    Follow up with [facility], MD On 12/09/2014.   Specialty:  Family Medicine   Why:  scheduled appt 12/09/14 at 10:45 per call to [facility] information:   [address] [place] [address] [phone]        Follow up with Arvilla Meres, MD On 12/03/2014.   Specialty:  Cardiology   Why:  :20pm for Hospital Follow Up   Contact information:   [address] [place] [address] [phone]       Araceli Bouche, [place] 11/26/2014, 6:41 PM PGY-1, [facility] Family [facility]

## 2014-11-26 NOTE — Progress Notes (Signed)
Family Medicine Teaching Service Daily Progress Note Intern Pager: [phone]  Patient name: Frank Davidson Medical record number: [id number] Date of birth: 1951/06/07 Age: 64 y.o. Gender: male  Primary Care Provider: Abbe Amsterdam, MD Consultants: Cardiology, Nephrology Code Status: Full Code  Pt Overview and Major Events to Date:  1/2 - Admitted for CHF exacerbation and AKI.  Assessment and Plan: Frank Davidson is a 64 y.o. male presenting with weight gain, edema, fatigue, and dyspnea, found to have acute on chronic renal failure. PMH is significant for severe systolic chronic heart failure, atrial fibrillation, CVA, COPD, diabetes mellitus, stage III chronic kidney disease, OSA, gout.  # CHF Exacerbation: Last EF in 06/2014 showed severely depressed function of LV. EF last mentioned as ~15% on echo in 10/2013. Followed by HF clinic - BNP 569.4 - Troponin 0.05 - CXR- chronic changes of bilateral lungs stable from prior CXR. No pulmonary edema or focal pneumonia. Cardiomegaly noted. - Daily Weights]  - Wt 258 on admission (Last HF office visit was 242) - currently 232, down from 239 yesterday -Lasix IV 60 mg BID--anticipate transition to oral today -Continue home Imdur, Carvedilol so as to not precipitate unstable angina -Hold spironolactone and lisinopril given acute kidney injury and soft blood pressures - O2 weaned. Currently on Room Air. Does not have oxygen at home at baseline. - Cardiology Consulted. Appreciate recommendations. - Follow up Echocardiogram  # Acute on Chronic Renal Failure: Baseline creatinine 1.5-2. Likely related to low output heart failure and renal hypoperfusion.  Patient followed by Nephrologist Dr. Briant Cedar.  - Creatinine 1.94 today, down from 2.49 yesterday and 3.55 at admission. Has returned to baseline! - Lasix as above -Will continue to monitor.  - Nephrology consulted. Appreciate recommendations.  -Currently on IV furosemide 60mg  BID--anticipate  transition to oral diuretic today  # Diarrhea, Hematochezia (per nursing report). Patient had colonoscopy with signs of internal hemrrhoids and diverticular disease.  - C diff negative. Positive FOBT. - Will monitor Hgb w/ daily CBC. - Eiliquis restarted on 1/4--monitor for worsening bleeding or drop in hemoglobin - Consider GI consult if worsens  # Chronic Pain: - Complained of pain overnight. Percocet increased to 1-2 tablets q4hr. Aspercreme. - Will reassess pain medications today  FEN/GI: Saline lock IV, and diuresing as above, renal diet with fluid restriction Prophylaxis: SCD's.  Disposition: Pending improvement in creatinine and volume status. Wean off oxygen.   Subjective:  Complained of exacerbation of chronic leg pain overnight. States pain was improved on Percocet. Denies shortness of breath or chest pain. States he feels ready to go home. No further complaints today.  Objective: Temp:  [98 F (36.7 C)-98.1 F (36.7 C)] 98 F (36.7 C) (01/05 0455) Pulse Rate:  [71-77] 71 (01/05 0455) Resp:  [16-18] 16 (01/05 0455) BP: (123-136)/(65-81) 136/81 mmHg (01/05 0455) SpO2:  [98 %-100 %] 100 % (01/05 0455) Weight:  [232 lb 6.4 oz (105.416 kg)] 232 lb 6.4 oz (105.416 kg) (01/05 0455) Physical Exam: General: 64yo male resting comfortably in bed in no apparent distress Cardiovascular: S1 and S2 noted. No murmurs, rubs, or gallops. Regular rate and rhythm. Respiratory: Clear to auscultation bilaterally. No wheezes, rales, or rhonchi. No increased work of breathing noted. Abdomen: Bowel sounds noted. Soft and nondistended. No tenderness with palpation. Extremities: trace leg edema.  Laboratory:  Recent Labs Lab 11/23/14 1719 11/24/14 0315 11/25/14 0425  WBC 8.0 7.7 11.9*  HGB 9.5* 9.8* 10.0*  HCT 31.0* 31.9* 31.1*  PLT 197 207 208  Recent Labs Lab 11/24/14 0315 11/25/14 0425 11/26/14 0329  NA 132* 136 136  K 5.2* 4.3 4.0  CL 97 98 94*  CO2 28 29 32  BUN 44*  47* 41*  CREATININE 3.55* 2.49* 1.94*  CALCIUM 8.7 9.0 9.1  GLUCOSE 214* 110* 123*   Imaging/Diagnostic Tests:  Dg Chest 2 View 11/23/2014   IMPRESSION: Chronic changes of bilateral lungs stable since prior chest x-ray of October 20, 2014. There is no pulmonary edema or focal pneumonia. Cardiomegaly.    Dg Chest 2 View 11/23/2014   IMPRESSION: 1. Stable mild cardiomegaly.  No acute or superimposed abnormality.   [address] [place], [place] 11/26/2014, 6:23 AM PGY-1, [facility] FPTS Intern pager: [phone], text pages welcome

## 2014-11-26 NOTE — Consult Note (Signed)
Visited the patient at bedside to offer [facility] Care Management services as benefit of his [employer]. Patient agreeable to services and will receive post hospital discharge call and will be evaluated for monthly home visits. Consent form signed.  Of note, [facility] Care Management services does not replace or interfere with any services that are arranged by inpatient case management or social work. For additional questions or referrals please contact, Charlesetta Shanks, RN BSN CCM, [employer], [facility] Liaison at [phone].

## 2014-11-26 NOTE — Progress Notes (Signed)
Patient ID: Frank Davidson, male   DOB: 05-Jul-1951, 64 y.o.   MRN: [id number]   Frank Davidson Progress Note    Assessment/ Plan:   1. AKI on CKD stage III: Continues to show good response to intravenous furosemide-would convert him to oral torsemide 40 mg twice a day today (this is double his usual outpatient dose and less than his current intravenous dose). Hopefully, this will continue to show good diuretic response and allow discharge from the hospital soon. Renal function continues show improvement and he does not have any acute electrolyte abnormalities requiring intervention. 2. Acute exacerbation of heart failure-with evidence of nonischemic cardiomyopathy/systolic heart failure-EF 20% 3. Anemia: No overt losses, hemoglobin barely at goal. 4. Hypertension: Blood pressure is fairly controlled with ongoing diuresis,  Will sign off at this time-please reconsult if further renal assistance is required  Subjective:   Reports to be feeling better and without any shortness of breath-abdominal distention/pedal edema significantly better    Objective:   BP 136/81 mmHg  Pulse 71  Temp(Src) 98 F (36.7 C) (Oral)  Resp 16  Ht 5\' 10"  (1.778 m)  Wt 105.416 kg (232 lb 6.4 oz)  BMI 33.35 kg/m2  SpO2 100%  Intake/Output Summary (Last 24 hours) at 11/26/14 1010 Last data filed at 11/26/14 5329  Gross per 24 hour  Intake   1202 ml  Output   4975 ml  Net  -3773 ml   Weight change: -3.284 kg (-7 lb 3.8 oz)  Physical Exam: Gen: Comfortably resting on the side of his bed CVS: Pulse regular in rate and rhythm Resp: Clear to auscultation, no rales/rhonchi Abd: Soft, obese, nontender Ext: Trace to 1+ lower extremity edema  Imaging: No results found.  Labs: BMET  Recent Labs Lab 11/20/14 1030 11/23/14 1719 11/24/14 0315 11/25/14 0425 11/26/14 0329  NA CANCELED 134* 132* 136 136  K CANCELED 4.6 5.2* 4.3 4.0  CL CANCELED 99 97 98 94*  CO2 CANCELED 25 28 29  32   GLUCOSE CANCELED 57* 214* 110* 123*  BUN CANCELED 41* 44* 47* 41*  CREATININE CANCELED 4.12* 3.55* 2.49* 1.94*  CALCIUM CANCELED 8.8 8.7 9.0 9.1  PHOS  --   --   --  3.9  --    CBC  Recent Labs Lab 11/23/14 1719 11/24/14 0315 11/25/14 0425 11/26/14 0716  WBC 8.0 7.7 11.9* 7.5  NEUTROABS 4.8  --   --   --   HGB 9.5* 9.8* 10.0* 10.4*  HCT 31.0* 31.9* 31.1* 31.7*  MCV 87.8 86.2 84.5 85.2  PLT 197 207 208 196    Medications:    . antiseptic oral rinse  7 mL Mouth Rinse BID  . apixaban  5 mg Oral BID  . carvedilol  12.5 mg Oral BID WC  . docusate sodium  100 mg Oral BID  . furosemide  60 mg Intravenous BID  . isosorbide mononitrate  60 mg Oral Daily  . sodium chloride  3 mL Intravenous Q12H   Zetta Bills, MD 11/26/2014, 10:10 AM

## 2014-12-03 ENCOUNTER — Ambulatory Visit (HOSPITAL_COMMUNITY)
Admission: RE | Admit: 2014-12-03 | Discharge: 2014-12-03 | Disposition: A | Discharge status: Home / Self Care | Payer: Commercial Managed Care - HMO | Source: Ambulatory Visit | Attending: Cardiology | Admitting: Cardiology

## 2014-12-03 ENCOUNTER — Encounter (HOSPITAL_COMMUNITY): Payer: Self-pay

## 2014-12-03 VITALS — BP 101/64 | HR 87 | Resp 20 | Wt 236.8 lb

## 2014-12-03 DIAGNOSIS — I5022 Chronic systolic (congestive) heart failure: Secondary | ICD-10-CM | POA: Diagnosis not present

## 2014-12-03 DIAGNOSIS — J438 Other emphysema: Secondary | ICD-10-CM

## 2014-12-03 DIAGNOSIS — I480 Paroxysmal atrial fibrillation: Secondary | ICD-10-CM | POA: Diagnosis not present

## 2014-12-03 LAB — BASIC METABOLIC PANEL
ANION GAP: 13 (ref 5–15)
BUN: 38 mg/dL — ABNORMAL HIGH (ref 6–23)
CO2: 26 mmol/L (ref 19–32)
CREATININE: 2.25 mg/dL — AB (ref 0.50–1.35)
Calcium: 9.9 mg/dL (ref 8.4–10.5)
Chloride: 97 mEq/L (ref 96–112)
GFR calc Af Amer: 34 mL/min — ABNORMAL LOW (ref >90)
GFR, EST NON AFRICAN AMERICAN: 29 mL/min — AB (ref >90)
Glucose, Bld: 81 mg/dL (ref 70–99)
Potassium: 4 mmol/L (ref 3.5–5.1)
SODIUM: 136 mmol/L (ref 135–145)

## 2014-12-03 LAB — DIGOXIN LEVEL: Digoxin Level: 0.2 ng/mL — ABNORMAL LOW (ref 0.8–2.0)

## 2014-12-03 NOTE — Patient Instructions (Signed)
STOP Spironolactone.  STOP Lisinopril.  STOP herbal supplement.  Follow up in 2 weeks.  Do the following things EVERYDAY: 1) Weigh yourself in the morning before breakfast. Write it down and keep it in a log. 2) Take your medicines as prescribed 3) Eat low salt foods-Limit salt (sodium) to 2000 mg per day.  4) Stay as active as you can everyday 5) Limit all fluids for the day to less than 2 liters

## 2014-12-03 NOTE — Progress Notes (Signed)
Patient ID: Frank Davidson, male   DOB: 08-19-1951, 64 y.o.   MRN: [id number]  PCP: Adline Mango FNP GI: Dr Yves Dill Nephrologist: Dr. Briant Cedar  HPI: Frank Davidson is a 64 yo male with a hx of obesity, PAF, HTN, COPD, HTN, gout, DM 2, CVA and CHF due to NICM. Intolerant Entresto at the lowest dose due to syncope.   Has previously followed in the [facility] in [place]. Previous cath in 2007 showed NICM with normal cors. Was living alone in [place] and apparently had issues managing medications by himself and was getting confused with PCP and cardiologists medications and was not taking his meds correctly. He moved to [place] to be near his nieces. Issues in the past with ACE-I due to renal failure. Highest creatinine in our system is ~1.5 but was apparently higher previously - thought to be related to taking his medications incorrectly.   Was admitted in 06/2013 from [facility] with left sided weakness and was found to have a moderate to large right hemispheric infarct. Went back to [employer] [facility].   Admitted to [facility] 10/14 through 09/09/13 with ADHF. Diuresed 30 pounds with IV lasix and transitioned to lasix 80 mg po bid. Continued on 25 mg carvedilol bid, isordil 20 mg daily.  Started on lisinopril 2.5 mg daily. Discharge weight 229 pounds. 09/29/13 CT abdomen and pelvis - no obstruction . Diverticulosis. No inflammatory changes.   Admitted 11/23/13 through 11/26/13 with volume overload and dyspnea. Had worsening cardiorenal syndrome. Nephrology consulted and managed with IV lasix and transitioned to torsemide 40 mg twice a day. Hydralazine, Spiro, and Lisinopril stopped at discharge. Diuresed 25 pounds. Discharge weight 232 pounds.   Post Hospital Follow up for Heart Failure:  Overall feeling pretty good. Denies SOB/PND/Orthopnea. Weight at home 236-242 pounds. No abdominal bloating. No bleeding problems. Ambulates with a cane.  Limiting fluid intake to < 2 liters per day. Taking all  medications as well as spiro, hydralazine, and lisinopril that were stopped on discharge.  ECHO 11/08/13 EF 15%  ECHO 07/05/14: EF 15%  ECHO 11/26/2014: EF 20%   08/13/14 RA = 7  RV = 50/3/10  PA = 54/22 (34)  PCW = 16  Fick cardiac output/index = 6.9/3.0  Thermo CO/CI = 4.7/2.1  PVR = 4.1 WU  Ao sat = 94%  PA sat = 68%,68%  SVC sat (sheath) = 62%   Labs 10/01/13 Dig level 2.5 --->dig stopped Labs 10/05/13 K 4.8 Creatinine 1.79 Labs 11/01/13 K 3.3 --Supplemented Creatinine 1.3 Uric Acid 9.3 AST 23 ALT 12 Hemoglobin 13 Hemoglobin 7.2  Labs 11/21/13 K 4.4 Creatinine 1.3            01/02/14 K 4.2, Creatinine 1.1            01/10/14 K 4.6, Creatinine 1.28           04/22/14 K 4.0 Creatinine 1.58         05/13/14 K 4.3, creatinine 1.46, pro-BNP 1377          06/24/14: K 3.9, creatinine 1.49         07/02/14 K 4.5, creatinine 1.82, pro-BNP 696.7         07/16/14 K 3.7, creatinine 2.41         08/01/14 K 4.3 creatinine 2.6 (demadex held for 2 days) - then restarted at 40 bid (baseline dose)         08/22/14 K 4.4 creatinine 1.6         09/02/14  Creatinine 1.6        10/20/14: creatinine 2.58, K 5.1         11/23/2014: Creatinine 4.12 --Ace and Spiro stopped          11/24/2014: Creatinine 3.55          1/4/12016: 2.49          11/26/2014: Hgb 10.4 Hct 31.7 Creatinine 1.94   SH: Retired Engineer, 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  Past Medical History  Diagnosis Date  . Atrial fibrillation     PAF  . Hypertension   . COPD (chronic obstructive pulmonary disease)   . Ocular herpes   . Gout   . CHF (congestive heart failure)   . Chronic pain   . Hyperlipidemia   . Nonischemic cardiomyopathy Aug 2015    EF 15%  . Ocular herpes   . Neuropathy   . Left-sided weakness     "because of the arthritis and edema"  . PVD (peripheral vascular disease) May 2015    abnormal ABIs  . Memory  impairment     "short term"  . CKD (chronic kidney disease), stage III     Dr. Briant Cedar follows-holding   . Fibromyalgia     neuropathy- hands, more than feet.  . Sleep apnea with use of continuous positive airway pressure (CPAP)     does not use cpap  . GERD (gastroesophageal reflux disease)     tums as needed  . Asthma   . Type II diabetes mellitus   . Stroke X 5    last stroke aug 2014"; denies residual on 09/12/2014  . Osteoarthritis   . Arthritis     "knees, right elbow, shoulder" (09/12/2014)    Current Outpatient Prescriptions  Medication Sig Dispense Refill  . allopurinol (ZYLOPRIM) 100 MG tablet Take 100 mg by mouth daily with breakfast.    . apixaban (ELIQUIS) 5 MG TABS tablet Take 5 mg by mouth 2 (two) times daily.    . baclofen (LIORESAL) 10 MG tablet Take 10 mg by mouth every 8 (eight) hours as needed for muscle spasms.    . carvedilol (COREG) 25 MG tablet Take 12.5 mg by mouth 2 (two) times daily with a meal.     . diclofenac (FLECTOR) 1.3 % PTCH Place 1 patch onto the skin 2 (two) times daily as needed (pain).    Marland Kitchen docusate sodium 100 MG CAPS Take 100 mg by mouth 2 (two) times daily. 10 capsule 0  . glipiZIDE (GLUCOTROL) 5 MG tablet Take 5 mg by mouth 2 (two) times daily.    Marland Kitchen Hawthorne Berry 550 MG CAPS Take 1,100 mg by mouth 2 (two) times daily.    . hydrALAZINE (APRESOLINE) 50 MG tablet Take 50 mg by mouth 2 (two) times daily.    . isosorbide mononitrate (IMDUR) 60 MG 24 hr tablet TAKE 1 TABLET (60 MG TOTAL) BY MOUTH DAILY. 30 tablet 3  . lisinopril (PRINIVIL,ZESTRIL) 2.5 MG tablet Take 2.5 mg by mouth daily.    . meloxicam (MOBIC) 7.5 MG tablet Take 1 tablet (7.5 mg total) by mouth daily. 30 tablet 0  . metolazone (ZAROXOLYN) 5 MG tablet Take 5 mg by mouth daily as needed (Swelling).     . Misc Natural Products (TURMERIC CURCUMIN) CAPS Take 1 capsule by mouth 3 (three) times daily.    Marland Kitchen  oxyCODONE-acetaminophen (PERCOCET) 10-325 MG per tablet Take 1 tablet by  mouth 4 (four) times daily. scheduled    . polyethylene glycol powder (GLYCOLAX/MIRALAX) powder Take 17 g by mouth 2 (two) times daily as needed. (Patient taking differently: Take 17 g by mouth 2 (two) times daily as needed (constipation). ) 3350 g 1  . pregabalin (LYRICA) 75 MG capsule Take 75 mg by mouth daily.    Marland Kitchen selenium 50 MCG TABS tablet Take 200 mcg by mouth daily.    Marland Kitchen torsemide (DEMADEX) 20 MG tablet Take 2 tablets (40 mg total) by mouth 2 (two) times daily. 60 tablet 0  . vitamin B-12 (CYANOCOBALAMIN) 250 MCG tablet Take 250 mcg by mouth daily.    . vitamin C (ASCORBIC ACID) 500 MG tablet Take 1,000 mg by mouth daily.    . colchicine 0.6 MG tablet Take 1.2 mg by mouth daily as needed (Gout). Colcrys     No current facility-administered medications for this encounter.    Filed Vitals:   12/03/14 1417  BP: 101/64  Pulse: 87  Resp: 20  Weight: 236 lb 12 oz (107.389 kg)  SpO2: 97%   PHYSICAL EXAM: General:  Obese male; Ambulated into clinic with a cane,  HEENT: normal Neck: supple. JVP difficult to assess d/t body habitus but does not appear elevated.  Carotids 2+ bilaterally; no bruits. No lymphadenopathy or thryomegaly appreciated. Cor: PMI normal. Regular rate & rhythm. No rubs, gallops or murmurs. Lungs: clear Abdomen: obese soft, nontender, mildly distended. No hepatosplenomegaly. No bruits or masses. Good bowel sounds. Extremities: no cyanosis, clubbing, rash, tr bilateral  edema.  Neuro: alert & orientedx3, cranial nerves grossly intact. Affect pleasant.   ASSESSMENT & PLAN:  1) Chronic Systolic Heart Failure: NICM, EF 5-10% (06/2014), EF 20% 1/16.  - -Reviewed discharge summary from 11/26/14. He was supposed to stop lisinopril, spiro, hydralazine however he continued to take them.  NYHA II symptoms and volume status stable. Will continue torsemide 40 mg BID. - Continue coreg 12.5 mg BID. Has not tolerated up titration in the past d/t fatigue. -Stop lisinopril and  spironolactone due to recent CKD. Check BMET now.  - Intolerant entresto at the lowest dose--had syncope.  Corlanor stopped during recent admit.  - He remains firm in his decision to not pursue ICD. He is aware he is at He is not interested in an ICD which could help with looking at thoracic impedence and volume as well as protect him from SCD.  - Reinforced the need and importance of daily weights, a low sodium diet and to avoid soups, and fluid restriction (less than 2 L a day). Instructed to call the HF clinic if weight increases more than 3 lbs overnight or 5 lbs in a week.  2) Gout - Continue allopurinol. Continue to follow with rheumatology. Is now going to Pain management clinic for pain.  3) Atrial fibrillation-  Regular. Continue BB and Eliquis. No s/s of bleeding.  4) CKD stage III - Baseline 1.9-2.4. Check BMET today  5) L Hip pain: - much improved with L hip surgery.   F/U 2 weeks.  CLEGG,AMY, NP-C 1:53 PM  Patient seen with NP, agree with the above note.  Recent admission with acute on chronic systolic CHF and AKI.  He was sent home off hydralazine, lisinopril, and spironolactone but restarted all of these when he got back home.  On exam today, he appears euvolemic and weight is down.   - Need to send BMET today.  -  I think he needs to stay off lisinopril.  Will have him hold spironolactone today until we see result of BMET.  - Can continue hydralazine/Imdur and Coreg.   - Continue current torsemide for now.  - Followup here in 2 wks.  - Followup with nephrology.  - Appears to be in NSR today, continue apixaban.   Marca Ancona 12/04/2014

## 2014-12-09 ENCOUNTER — Ambulatory Visit: Payer: Commercial Managed Care - HMO | Admitting: Family Medicine

## 2014-12-16 ENCOUNTER — Telehealth (HOSPITAL_COMMUNITY): Payer: Self-pay

## 2014-12-16 NOTE — Telephone Encounter (Signed)
Patient called c/o 4 lb weight gain overnight, instructed to take his PRN metolazone now and take his afternoon Torsemide dose 30 minutes later.  Patient will be seen in ou clinic tomorrow.  Aware and agreeable.  Ave Filter

## 2014-12-17 ENCOUNTER — Ambulatory Visit (HOSPITAL_COMMUNITY)
Admission: RE | Admit: 2014-12-17 | Discharge: 2014-12-17 | Disposition: A | Discharge status: Home / Self Care | Payer: Commercial Managed Care - HMO | Source: Ambulatory Visit | Attending: Cardiology | Admitting: Cardiology

## 2014-12-17 ENCOUNTER — Encounter (HOSPITAL_COMMUNITY): Payer: Self-pay

## 2014-12-17 VITALS — BP 104/64 | HR 78 | Resp 18 | Wt 232.0 lb

## 2014-12-17 DIAGNOSIS — N183 Chronic kidney disease, stage 3 unspecified: Secondary | ICD-10-CM

## 2014-12-17 DIAGNOSIS — I429 Cardiomyopathy, unspecified: Secondary | ICD-10-CM | POA: Diagnosis not present

## 2014-12-17 DIAGNOSIS — E785 Hyperlipidemia, unspecified: Secondary | ICD-10-CM | POA: Diagnosis not present

## 2014-12-17 DIAGNOSIS — M109 Gout, unspecified: Secondary | ICD-10-CM | POA: Diagnosis not present

## 2014-12-17 DIAGNOSIS — G4730 Sleep apnea, unspecified: Secondary | ICD-10-CM | POA: Diagnosis not present

## 2014-12-17 DIAGNOSIS — I739 Peripheral vascular disease, unspecified: Secondary | ICD-10-CM | POA: Insufficient documentation

## 2014-12-17 DIAGNOSIS — M797 Fibromyalgia: Secondary | ICD-10-CM | POA: Diagnosis not present

## 2014-12-17 DIAGNOSIS — Z7901 Long term (current) use of anticoagulants: Secondary | ICD-10-CM | POA: Diagnosis not present

## 2014-12-17 DIAGNOSIS — Z79899 Other long term (current) drug therapy: Secondary | ICD-10-CM | POA: Diagnosis not present

## 2014-12-17 DIAGNOSIS — I480 Paroxysmal atrial fibrillation: Secondary | ICD-10-CM | POA: Diagnosis not present

## 2014-12-17 DIAGNOSIS — K219 Gastro-esophageal reflux disease without esophagitis: Secondary | ICD-10-CM | POA: Diagnosis not present

## 2014-12-17 DIAGNOSIS — E119 Type 2 diabetes mellitus without complications: Secondary | ICD-10-CM | POA: Insufficient documentation

## 2014-12-17 DIAGNOSIS — J449 Chronic obstructive pulmonary disease, unspecified: Secondary | ICD-10-CM | POA: Diagnosis not present

## 2014-12-17 DIAGNOSIS — I129 Hypertensive chronic kidney disease with stage 1 through stage 4 chronic kidney disease, or unspecified chronic kidney disease: Secondary | ICD-10-CM | POA: Diagnosis not present

## 2014-12-17 DIAGNOSIS — Z8673 Personal history of transient ischemic attack (TIA), and cerebral infarction without residual deficits: Secondary | ICD-10-CM | POA: Insufficient documentation

## 2014-12-17 DIAGNOSIS — E669 Obesity, unspecified: Secondary | ICD-10-CM | POA: Diagnosis not present

## 2014-12-17 DIAGNOSIS — I5022 Chronic systolic (congestive) heart failure: Secondary | ICD-10-CM | POA: Diagnosis present

## 2014-12-17 DIAGNOSIS — J45909 Unspecified asthma, uncomplicated: Secondary | ICD-10-CM | POA: Insufficient documentation

## 2014-12-17 MED ORDER — HYDRALAZINE HCL 50 MG PO TABS: 50.0000 mg | ORAL_TABLET | Freq: Three times a day (TID) | ORAL | Status: DC

## 2014-12-17 NOTE — Patient Instructions (Signed)
INCREASE Hydralazine to 50 mg three times per day  Labs today  Your physician recommends that you schedule a follow-up appointment in: 2 months  Do the following things EVERYDAY: 1) Weigh yourself in the morning before breakfast. Write it down and keep it in a log. 2) Take your medicines as prescribed 3) Eat low salt foods-Limit salt (sodium) to 2000 mg per day.  4) Stay as active as you can everyday 5) Limit all fluids for the day to less than 2 liters 6)

## 2014-12-18 NOTE — Progress Notes (Signed)
Patient ID: Frank Davidson, male   DOB: [birth year], 64 y.o.   MRN: [id number]  PCP: Adline Mango FNP GI: Dr Yves Dill Nephrologist: Dr. Briant Cedar  HPI: Frank Davidson is a 64 yo male with a hx of obesity, PAF, HTN, COPD, HTN, gout, DM 2, CVA and CHF due to NICM. Intolerant Entresto at the lowest dose due to syncope.   Has previously followed in the [facility] in [place]. Previous cath in 2007 showed NICM with normal cors. Was living alone in [place] and apparently had issues managing medications by himself and was getting confused with PCP and cardiologists medications and was not taking his meds correctly. He moved to [place] to be near his nieces. Issues in the past with ACE-I due to renal failure. Highest creatinine in our system is ~1.5 but was apparently higher previously - thought to be related to taking his medications incorrectly.   Was admitted in 06/2013 from [facility] with left sided weakness and was found to have a moderate to large right hemispheric infarct. Went back to [employer] [facility].   Admitted to [facility] 10/14 through 09/09/13 with ADHF. Diuresed 30 pounds with IV lasix and transitioned to lasix 80 mg po bid. Continued on 25 mg carvedilol bid, isordil 20 mg daily.  Started on lisinopril 2.5 mg daily. Discharge weight 229 pounds. 09/29/13 CT abdomen and pelvis - no obstruction . Diverticulosis. No inflammatory changes.   Admitted 11/23/13 through 11/26/13 with volume overload and dyspnea. Had worsening cardiorenal syndrome. Nephrology consulted and managed with IV lasix and transitioned to torsemide 40 mg twice a day.  Diuresed 25 pounds. Discharge weight 232 pounds. Hydralazine, Spiro, and Lisinopril stopped at discharge. He is also off digoxin.  He is back on hydralazine but only taking it twice a day.    Weight is down 4 lbs today compared to last appointment.  He has taken metolazone once since I saw him, earlier this week.  No dyspnea walking on flat ground.  He can walk up a  flight of steps without problems. No edema. No orthopnea/PND.  No melena/BRBPR on apixaban.   ECHO 11/08/13 EF 15%  ECHO 07/05/14: EF 15%  ECHO 11/26/2014: EF 20%   08/13/14 RA = 7  RV = 50/3/10  PA = 54/22 (34)  PCW = 16  Fick cardiac output/index = 6.9/3.0  Thermo CO/CI = 4.7/2.1  PVR = 4.1 WU  Ao sat = 94%  PA sat = 68%,68%  SVC sat (sheath) = 62%   Labs 10/01/13 Dig level 2.5 --->dig stopped Labs 10/05/13 K 4.8 Creatinine 1.79 Labs 11/01/13 K 3.3 --Supplemented Creatinine 1.3 Uric Acid 9.3 AST 23 ALT 12 Hemoglobin 13 Hemoglobin 7.2  Labs 11/21/13 K 4.4 Creatinine 1.3            01/02/14 K 4.2, Creatinine 1.1            01/10/14 K 4.6, Creatinine 1.28           04/22/14 K 4.0 Creatinine 1.58         05/13/14 K 4.3, creatinine 1.46, pro-BNP 1377          06/24/14: K 3.9, creatinine 1.49         07/02/14 K 4.5, creatinine 1.82, pro-BNP 696.7         07/16/14 K 3.7, creatinine 2.41         08/01/14 K 4.3 creatinine 2.6 (demadex held for 2 days) - then restarted at 40 bid (baseline dose)  08/22/14 K 4.4 creatinine 1.6         09/02/14 Creatinine 1.6        10/20/14: creatinine 2.58, K 5.1         11/23/2014: Creatinine 4.12 --Ace and Spiro stopped          11/24/2014: Creatinine 3.55          1/4/12016: 2.49          11/26/2014: Hgb 10.4 Hct 31.7 Creatinine 1.94          12/03/14: K 4, creatinine 2.25  SH: Retired Engineer, 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  Past Medical History  Diagnosis Date  . Atrial fibrillation     PAF  . Hypertension   . COPD (chronic obstructive pulmonary disease)   . Ocular herpes   . Gout   . CHF (congestive heart failure)   . Chronic pain   . Hyperlipidemia   . Nonischemic cardiomyopathy Aug 2015    EF 15%  . Ocular herpes   . Neuropathy   . Left-sided weakness     "because of the arthritis and edema"  . PVD (peripheral vascular disease)  May 2015    abnormal ABIs  . Memory impairment     "short term"  . CKD (chronic kidney disease), stage III     Dr. Briant Cedar follows-holding   . Fibromyalgia     neuropathy- hands, more than feet.  . Sleep apnea with use of continuous positive airway pressure (CPAP)     does not use cpap  . GERD (gastroesophageal reflux disease)     tums as needed  . Asthma   . Type II diabetes mellitus   . Stroke X 5    last stroke aug 2014"; denies residual on 09/12/2014  . Osteoarthritis   . Arthritis     "knees, right elbow, shoulder" (09/12/2014)    Current Outpatient Prescriptions  Medication Sig Dispense Refill  . allopurinol (ZYLOPRIM) 100 MG tablet Take 100 mg by mouth daily with breakfast.    . apixaban (ELIQUIS) 5 MG TABS tablet Take 5 mg by mouth 2 (two) times daily.    . baclofen (LIORESAL) 10 MG tablet Take 10 mg by mouth every 8 (eight) hours as needed for muscle spasms.    . carvedilol (COREG) 25 MG tablet Take 12.5 mg by mouth 2 (two) times daily with a meal.     . colchicine 0.6 MG tablet Take 1.2 mg by mouth daily as needed (Gout). Colcrys    . diclofenac (FLECTOR) 1.3 % PTCH Place 1 patch onto the skin 2 (two) times daily as needed (pain).    Marland Kitchen docusate sodium 100 MG CAPS Take 100 mg by mouth 2 (two) times daily. 10 capsule 0  . glipiZIDE (GLUCOTROL) 5 MG tablet Take 5 mg by mouth 2 (two) times daily.    Marland Kitchen Hawthorne Berry 550 MG CAPS Take 1,100 mg by mouth 2 (two) times daily.    . hydrALAZINE (APRESOLINE) 50 MG tablet Take 1 tablet (50 mg total) by mouth 3 (three) times daily. 90 tablet 3  . isosorbide mononitrate (IMDUR) 60 MG 24 hr tablet TAKE 1 TABLET (60 MG TOTAL) BY MOUTH DAILY. 30 tablet 3  . meloxicam (MOBIC) 7.5 MG tablet Take 1 tablet (7.5 mg total) by mouth daily. 30 tablet 0  . metolazone (ZAROXOLYN) 5 MG  tablet Take 5 mg by mouth daily as needed (Swelling).     . Misc Natural Products (TURMERIC CURCUMIN) CAPS Take 1 capsule by mouth 3 (three) times daily.    Marland Kitchen  oxyCODONE-acetaminophen (PERCOCET) 10-325 MG per tablet Take 1 tablet by mouth 4 (four) times daily. scheduled    . polyethylene glycol powder (GLYCOLAX/MIRALAX) powder Take 17 g by mouth 2 (two) times daily as needed. (Patient taking differently: Take 17 g by mouth 2 (two) times daily as needed (constipation). ) 3350 g 1  . pregabalin (LYRICA) 75 MG capsule Take 75 mg by mouth daily.    Marland Kitchen selenium 50 MCG TABS tablet Take 200 mcg by mouth daily.    Marland Kitchen torsemide (DEMADEX) 20 MG tablet Take 2 tablets (40 mg total) by mouth 2 (two) times daily. 60 tablet 0  . vitamin B-12 (CYANOCOBALAMIN) 250 MCG tablet Take 250 mcg by mouth daily.    . vitamin C (ASCORBIC ACID) 500 MG tablet Take 1,000 mg by mouth daily.     No current facility-administered medications for this encounter.    Filed Vitals:   12/17/14 1502  BP: 104/64  Pulse: 78  Resp: 18  Weight: 232 lb (105.235 kg)  SpO2: 98%   PHYSICAL EXAM: General:  Obese male; Ambulated into clinic with a cane,  HEENT: normal Neck: supple. JVP difficult to assess d/t body habitus but does not appear elevated.  Carotids 2+ bilaterally; no bruits. No lymphadenopathy or thryomegaly appreciated. Cor: PMI normal. Regular rate & rhythm. No rubs, gallops or murmurs. Lungs: clear Abdomen: obese soft, nontender, mildly distended. No hepatosplenomegaly. No bruits or masses. Good bowel sounds. Extremities: no cyanosis, clubbing, rash, tr bilateral  edema.  Neuro: alert & orientedx3, cranial nerves grossly intact. Affect pleasant.   ASSESSMENT & PLAN:  1) Chronic Systolic Heart Failure: nonischemic cardiomyopathy, EF 20% 1/16. NYHA II symptoms and volume status stable.  - Will continue torsemide 40 mg BID. - Continue coreg 12.5 mg BID. Has not tolerated up titration in the past due to fatigue. - Continue Imdur but increase hydralazine to 50 mg tid instead of bid.  - Off digoxin, lisinopril and spironolactone due to CKD. Check BMET now.  - Syncope in past  with Entresto.  Corlanor also stopped during recent admit.  - He remains firm in his decision to not pursue ICD.  - Reinforced the need and importance of daily weights, a low sodium diet and to avoid soups, and fluid restriction (less than 2 L a day). Instructed to call the HF clinic if weight increases more than 3 lbs overnight or 5 lbs in a week.  2) Gout: Continue allopurinol. Continue to follow with rheumatology. Is now going to Pain management clinic for pain.  3) Atrial fibrillation: NSR by exam.  Continue Eliquis. Recent CBC stable.  4) CKD stage III: Baseline 1.9-2.4. Check BMET today.  He has nephrology followup.   Marca Ancona 12/18/2014

## 2014-12-19 ENCOUNTER — Telehealth (HOSPITAL_COMMUNITY): Payer: Self-pay | Admitting: Vascular Surgery

## 2014-12-19 NOTE — Telephone Encounter (Signed)
Pt called his Eliquis is 189.00 for 90 days.. Pt can not afford this, he wants to know if he could get some assistance or is their another option.. Please advise

## 2014-12-19 NOTE — Telephone Encounter (Signed)
Spoke w/pt, he states his insurance plan has changed and Eliquis is not too expensive, he has applied for an Health and safety inspector program and is waiting to hear back from them, have left 2 weeks worth of samples at front desk for pt he will p/u later today, he states he ran out of Hydralazine today but it has been filled and [employer] has shipped it out.  He will call us back if he does not qualify for assistance program

## 2014-12-20 ENCOUNTER — Other Ambulatory Visit: Payer: Self-pay

## 2014-12-20 ENCOUNTER — Other Ambulatory Visit (HOSPITAL_COMMUNITY): Payer: Self-pay | Admitting: Cardiology

## 2014-12-20 DIAGNOSIS — E1165 Type 2 diabetes mellitus with hyperglycemia: Secondary | ICD-10-CM

## 2014-12-20 DIAGNOSIS — M1000 Idiopathic gout, unspecified site: Secondary | ICD-10-CM

## 2014-12-20 DIAGNOSIS — M25551 Pain in right hip: Secondary | ICD-10-CM

## 2014-12-20 DIAGNOSIS — IMO0002 Reserved for concepts with insufficient information to code with codable children: Secondary | ICD-10-CM

## 2014-12-20 DIAGNOSIS — K5900 Constipation, unspecified: Secondary | ICD-10-CM

## 2014-12-20 DIAGNOSIS — E1129 Type 2 diabetes mellitus with other diabetic kidney complication: Secondary | ICD-10-CM

## 2014-12-20 MED ORDER — GLUCOSE BLOOD VI STRP: ORAL_STRIP | Status: DC

## 2014-12-20 MED ORDER — GLIPIZIDE 5 MG PO TABS: 5.0000 mg | ORAL_TABLET | Freq: Two times a day (BID) | ORAL | Status: DC

## 2014-12-20 MED ORDER — CARVEDILOL 25 MG PO TABS: 12.5000 mg | ORAL_TABLET | Freq: Two times a day (BID) | ORAL | Status: DC

## 2014-12-20 MED ORDER — TORSEMIDE 20 MG PO TABS: 40.0000 mg | ORAL_TABLET | Freq: Two times a day (BID) | ORAL | Status: DC

## 2014-12-20 MED ORDER — BD SWAB SINGLE USE REGULAR PADS: MEDICATED_PAD | Status: DC

## 2014-12-20 MED ORDER — ALLOPURINOL 100 MG PO TABS: 100.0000 mg | ORAL_TABLET | Freq: Every day | ORAL | Status: DC

## 2014-12-20 MED ORDER — ACCU-CHEK SMARTVIEW CONTROL VI LIQD: Status: DC

## 2014-12-20 MED ORDER — HYDRALAZINE HCL 50 MG PO TABS: 50.0000 mg | ORAL_TABLET | Freq: Three times a day (TID) | ORAL | Status: DC

## 2014-12-20 MED ORDER — METOLAZONE 5 MG PO TABS: 5.0000 mg | ORAL_TABLET | Freq: Every day | ORAL | Status: DC | PRN

## 2014-12-20 MED ORDER — ISOSORBIDE MONONITRATE ER 60 MG PO TB24: ORAL_TABLET | ORAL | Status: DC

## 2014-12-20 MED ORDER — ACCU-CHEK NANO SMARTVIEW W/DEVICE KIT: PACK | Status: DC

## 2014-12-20 MED ORDER — POLYETHYLENE GLYCOL 3350 17 GM/SCOOP PO POWD: 17.0000 g | Freq: Every day | ORAL | Status: DC

## 2014-12-20 MED ORDER — ACCU-CHEK FASTCLIX LANCETS MISC: Status: DC

## 2014-12-20 MED ORDER — BACLOFEN 10 MG PO TABS: 10.0000 mg | ORAL_TABLET | Freq: Three times a day (TID) | ORAL | Status: DC | PRN

## 2014-12-20 NOTE — Telephone Encounter (Signed)
His current creatinine clearance is 36.  These meds are ok. Called him regarding his mobic- he reports he has been taking this off an on,but is ok with trying tylenol instead as this would be safer while he is on eliquis.  He will let me know if he does absolutely need meloxicam but for now I did not refill this.

## 2014-12-20 NOTE — Telephone Encounter (Signed)
[facility] faxed req for RFs of DM supplies and allopurinol, baclofen, glipizide and meloxicam, Miralax. I have pended all for 90 day supplies. Pt has not had check up recently but has appt sch for end of Feb. Dr Patsy Lager do you want to approve 90 day RFs? I don't see that we have Rxd the baclofen for him previously. Dr L Rxd the meloxicam in Dec but didn't put RFs on it.

## 2014-12-23 ENCOUNTER — Telehealth (HOSPITAL_COMMUNITY): Payer: Self-pay

## 2014-12-23 ENCOUNTER — Other Ambulatory Visit (HOSPITAL_COMMUNITY): Payer: Self-pay

## 2014-12-23 ENCOUNTER — Other Ambulatory Visit: Payer: Self-pay

## 2014-12-23 DIAGNOSIS — M25551 Pain in right hip: Secondary | ICD-10-CM

## 2014-12-23 MED ORDER — METOLAZONE 5 MG PO TABS: 5.0000 mg | ORAL_TABLET | Freq: Every day | ORAL | Status: DC | PRN

## 2014-12-23 MED ORDER — ISOSORBIDE MONONITRATE ER 60 MG PO TB24: ORAL_TABLET | ORAL | Status: DC

## 2014-12-23 MED ORDER — TORSEMIDE 20 MG PO TABS: 40.0000 mg | ORAL_TABLET | Freq: Two times a day (BID) | ORAL | Status: DC

## 2014-12-23 MED ORDER — MELOXICAM 7.5 MG PO TABS: 7.5000 mg | ORAL_TABLET | Freq: Every day | ORAL | Status: DC

## 2014-12-23 MED ORDER — CARVEDILOL 25 MG PO TABS: 12.5000 mg | ORAL_TABLET | Freq: Two times a day (BID) | ORAL | Status: DC

## 2014-12-23 MED ORDER — HYDRALAZINE HCL 50 MG PO TABS: 50.0000 mg | ORAL_TABLET | Freq: Three times a day (TID) | ORAL | Status: DC

## 2014-12-23 NOTE — Telephone Encounter (Signed)
Dr L, do you want to give RFs of this?

## 2014-12-23 NOTE — Telephone Encounter (Signed)
Patient called to report weight gain.  Saturday 2lbs overnight, Sunday 2 lbs more overnight.  Took a metolazone on Sunday and another this morning.  Unsure if he "ate too much over weekend or if it is fluid" because he feels fine and does not notice swelling, shortness of breath, etc.  Urine output slightly up, will weigh again in am and report updated weight.  Ave Filter

## 2014-12-30 ENCOUNTER — Encounter: Payer: Self-pay | Admitting: Internal Medicine

## 2014-12-31 ENCOUNTER — Telehealth (HOSPITAL_COMMUNITY): Payer: Self-pay | Admitting: Vascular Surgery

## 2014-12-31 NOTE — Telephone Encounter (Signed)
Patient states he self enrolled in [place] patient assistance program for eliquis, however they gave him a 2 week window until he will be approved and able to get medication.  Will come tomorrow for 2 week supply of samples.  Ave Filter

## 2014-12-31 NOTE — Telephone Encounter (Signed)
Pt is out of Eliquis he thought someone from this office was going to set him up with an assistance program.. Please advise

## 2015-01-01 ENCOUNTER — Telehealth: Payer: Self-pay

## 2015-01-01 DIAGNOSIS — G894 Chronic pain syndrome: Secondary | ICD-10-CM

## 2015-01-01 NOTE — Telephone Encounter (Signed)
Ok to place referral.

## 2015-01-01 NOTE — Telephone Encounter (Signed)
Pt states he have an appt with Dr.Copland coming up but in the meantime he need a referral to [facility]. Please call [phone]

## 2015-01-03 ENCOUNTER — Other Ambulatory Visit (HOSPITAL_COMMUNITY): Payer: Self-pay

## 2015-01-03 MED ORDER — TORSEMIDE 20 MG PO TABS: 40.0000 mg | ORAL_TABLET | Freq: Two times a day (BID) | ORAL | Status: DC

## 2015-01-03 MED ORDER — HYDRALAZINE HCL 50 MG PO TABS: 50.0000 mg | ORAL_TABLET | Freq: Three times a day (TID) | ORAL | Status: DC

## 2015-01-03 MED ORDER — METOLAZONE 5 MG PO TABS: 5.0000 mg | ORAL_TABLET | Freq: Every day | ORAL | Status: DC | PRN

## 2015-01-03 MED ORDER — ISOSORBIDE MONONITRATE ER 60 MG PO TB24: ORAL_TABLET | ORAL | Status: DC

## 2015-01-03 MED ORDER — CARVEDILOL 25 MG PO TABS: 12.5000 mg | ORAL_TABLET | Freq: Two times a day (BID) | ORAL | Status: DC

## 2015-01-03 NOTE — Telephone Encounter (Signed)
Called him- he sees [facility]- will place referral for him, he has appt set up already

## 2015-01-09 ENCOUNTER — Encounter: Payer: Self-pay | Admitting: Internal Medicine

## 2015-01-11 ENCOUNTER — Other Ambulatory Visit (HOSPITAL_COMMUNITY): Payer: Self-pay | Admitting: Anesthesiology

## 2015-01-13 ENCOUNTER — Telehealth (HOSPITAL_COMMUNITY): Payer: Self-pay | Admitting: *Deleted

## 2015-01-13 NOTE — Telephone Encounter (Signed)
Pt had labs with research dept on 2/19, received results bun 71, cr 2.65 per Tonye Becket, NP have pt hold Torsemide for 2 days, pt aware and agreeable, he states he did take Metolazone 2 times last week and his wt is at his baseline now, he will hold meds and come for recheck on 3/2

## 2015-01-17 ENCOUNTER — Telehealth (HOSPITAL_COMMUNITY): Payer: Self-pay | Admitting: Vascular Surgery

## 2015-01-17 NOTE — Telephone Encounter (Signed)
Pt called he gained 10 lbs in a 1/2.Marland Kitchen Please advise

## 2015-01-17 NOTE — Telephone Encounter (Signed)
Spoke w/pt, Torsemide was held [place] and Wed due to elevated kidney function per Tonye Becket, NP.  Pt's states wt Mon was 230 lb, which is normal for him but wt has only increased since then and today it was 241 lb.  He did resume his Torsemide 40 mg bid yesterday but weight still increased from 237 yesterday to 241 lb today.  He states he has only minimal SOB with activity and only very mild/slight edema in feet.  He will go ahead and take a metolazone today along with torsemide.  He is sch for repeat labs 3/2, will c/b if weight not decreasing

## 2015-01-17 NOTE — Telephone Encounter (Signed)
Left message to call back  

## 2015-01-20 ENCOUNTER — Encounter: Payer: Self-pay | Admitting: Family Medicine

## 2015-01-20 ENCOUNTER — Ambulatory Visit (INDEPENDENT_AMBULATORY_CARE_PROVIDER_SITE_OTHER): Payer: Commercial Managed Care - HMO | Admitting: Family Medicine

## 2015-01-20 VITALS — BP 105/68 | HR 82 | Temp 98.2°F | Resp 18 | Ht 70.0 in | Wt 235.2 lb

## 2015-01-20 DIAGNOSIS — E118 Type 2 diabetes mellitus with unspecified complications: Secondary | ICD-10-CM | POA: Diagnosis not present

## 2015-01-20 DIAGNOSIS — E876 Hypokalemia: Secondary | ICD-10-CM

## 2015-01-20 DIAGNOSIS — N189 Chronic kidney disease, unspecified: Secondary | ICD-10-CM

## 2015-01-20 LAB — COMPREHENSIVE METABOLIC PANEL
ALT: 27 U/L (ref 0–53)
AST: 31 U/L (ref 0–37)
Albumin: 4.3 g/dL (ref 3.5–5.2)
Alkaline Phosphatase: 114 U/L (ref 39–117)
BILIRUBIN TOTAL: 0.4 mg/dL (ref 0.2–1.2)
BUN: 43 mg/dL — AB (ref 6–23)
CO2: 30 meq/L (ref 19–32)
Calcium: 9.9 mg/dL (ref 8.4–10.5)
Chloride: 92 mEq/L — ABNORMAL LOW (ref 96–112)
Creat: 2.25 mg/dL — ABNORMAL HIGH (ref 0.50–1.35)
GLUCOSE: 109 mg/dL — AB (ref 70–99)
Potassium: 3.2 mEq/L — ABNORMAL LOW (ref 3.5–5.3)
Sodium: 137 mEq/L (ref 135–145)
Total Protein: 7.8 g/dL (ref 6.0–8.3)

## 2015-01-20 LAB — HEMOGLOBIN A1C
Hgb A1c MFr Bld: 5.6 % (ref <5.7)
Mean Plasma Glucose: 114 mg/dL (ref <117)

## 2015-01-20 LAB — MICROALBUMIN, URINE: MICROALB UR: 0.4 mg/dL (ref <2.0)

## 2015-01-20 MED ORDER — POTASSIUM CHLORIDE ER 10 MEQ PO TBCR: 10.0000 meq | EXTENDED_RELEASE_TABLET | Freq: Two times a day (BID) | ORAL | Status: DC

## 2015-01-20 NOTE — Progress Notes (Addendum)
[facility] [address] [place] [address] [phone]  Date:  01/20/2015   Name:  Frank Davidson   DOB:  Jan 26, 1951   MRN:  [id number]  PCP:  [name], MD    Chief Complaint: Follow-up   History of Present Illness:  Frank Davidson is a 64 y.o. very pleasant male patient who presents with the following:  Here to follow-up from recent hospital stay- he was admitted with CHF from 1/2 to 11/26/14. He states that he has "good days and bad days."  However overall he is doing better. He called his cardiologist last week when his weight was 241- he discussed with his cardiologist and adjusted his diuretic, now weight is back to normal.    He also had acute on chronic RF- his baseline creat is between 1.5 and 2.  At last check he was at 2.25- will recheck for him today.  He sees Dr. Mercy Moore at [facility] He needs a microalbumin today, and he is due for an eye exam.   Lab Results  Component Value Date   HGBA1C 5.4 10/20/2014     BP Readings from Last 3 Encounters:  01/20/15 105/68  11/26/14 136/81  11/23/14 118/72     Wt Readings from Last 3 Encounters:  01/20/15 235 lb 3.2 oz (106.686 kg)  11/26/14 232 lb 6.4 oz (105.416 kg)  11/23/14 258 lb (117.028 kg)     Patient Active Problem List   Diagnosis Date Noted  . Acute dyspnea   . Non-ischemic cardiomyopathy- EF 15% echo Aug 2015 11/24/2014  . Chronic anticoagulation 11/24/2014  . SOB (shortness of breath)   . Weight gain   . Acute on chronic renal insufficiency 11/23/2014  . Syncope 10/19/2014  . S/P left THA, AA 09/12/2014  . Gout 04/29/2014  . PVD (peripheral vascular disease)- LE ABI's < 0.5 bilat 04/19/2014  . Ocular herpes   . Neuropathy   . Left-sided weakness   . OSA (obstructive sleep apnea) 07/09/2013  . Chronic systolic heart failure 77/82/4235  . 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 PAF (paroxysmal atrial fibrillation) 07/03/2013  . Hypertension   . COPD (chronic  obstructive pulmonary disease)   . Diabetes mellitus with nephropathy   . CKD (chronic kidney disease), stage III   . Acute on chronic systolic CHF (congestive heart failure)   . Polysubstance abuse     Past Medical History  Diagnosis Date  . Atrial fibrillation     PAF  . Hypertension   . COPD (chronic obstructive pulmonary disease)   . Ocular herpes   . Gout   . CHF (congestive heart failure)   . Chronic pain   . Hyperlipidemia   . Nonischemic cardiomyopathy Aug 2015    EF 15%  . Ocular herpes   . Neuropathy   . Left-sided weakness     "because of the arthritis and edema"  . PVD (peripheral vascular disease) May 2015    abnormal ABIs  . Memory impairment     "short term"  . CKD (chronic kidney disease), stage III     Dr. Mercy Moore follows-holding   . Fibromyalgia     neuropathy- hands, more than feet.  . Sleep apnea with use of continuous positive airway pressure (CPAP)     does not use cpap  . GERD (gastroesophageal reflux disease)     tums as needed  . Asthma   . Type II diabetes mellitus   .  Stroke X 5    last stroke aug 2014"; denies residual on 09/12/2014  . Osteoarthritis   . Arthritis     "knees, right elbow, shoulder" (09/12/2014)    Past Surgical History  Procedure Laterality Date  . 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 Tonsillectomy  age 20  . Esophagogastroduodenoscopy N/A 12/24/2013    Procedure: ESOPHAGOGASTRODUODENOSCOPY (EGD);  Surgeon: Inda Castle, MD;  Location: [facility];  Service: Endoscopy;  Laterality: N/A;  . Colonoscopy N/A 12/24/2013    Procedure: COLONOSCOPY;  Surgeon: Inda Castle, MD;  Location: [facility];  Service: Endoscopy;  Laterality: N/A;  . Joint replacement    . Cardiac catheterization  10/2006    normal coronary arteries;   Marland Kitchen Cataract extraction w/ intraocular lens  implant, bilateral Bilateral ~ 2008  . Total hip arthroplasty Left 09/12/2014    Procedure: LEFT TOTAL HIP ARTHROPLASTY ANTERIOR APPROACH;  Surgeon:  Mauri Pole, MD;  Location: [place];  Service: Orthopedics;  Laterality: Left;  . Right heart catheterization N/A 08/13/2014    Rt ht cath prior to hip surgery    History  Substance Use Topics  . Smoking status: Former Smoker -- 0.50 packs/day for 10 years    Types: Cigarettes    Quit date: 12/18/2006  . Smokeless tobacco: Never Used  . Alcohol Use: No     Comment: Quit 2008    Family History  Problem Relation Age of Onset  . Cancer Mother   . Hypertension Mother   . Lung disease Mother   . Diabetes Father   . Diabetes Sister   . Hypertension Sister   . Hypertension Brother   . Hypertension Sister   . Colon cancer Neg Hx     Allergies  Allergen Reactions  . Corlanor [Ivabradine] Palpitations and Other (See Comments)    Kidney and heart problems, syncope  . Januvia [Sitagliptin] Other (See Comments)    "Almost killed me"   . Lunesta [Eszopiclone] Palpitations and Other (See Comments)    Kidney and heart problems, syncope  . Actos [Pioglitazone] Other (See Comments)    Caused blood in urine and fluid retention     Medication list has been reviewed and updated.  Current Outpatient Prescriptions on File Prior to Visit  Medication Sig Dispense Refill  . ACCU-CHEK FASTCLIX LANCETS MISC Test blood sugar once daily. Dx code: E11.8 100 each 3  . Alcohol Swabs (B-D SINGLE USE SWABS REGULAR) PADS Test blood sugar daily. Dx code: E11.8 100 each 3  . allopurinol (ZYLOPRIM) 100 MG tablet Take 1 tablet (100 mg total) by mouth daily with breakfast. 90 tablet 3  . apixaban (ELIQUIS) 5 MG TABS tablet Take 5 mg by mouth 2 (two) times daily.    . baclofen (LIORESAL) 10 MG tablet Take 1 tablet (10 mg total) by mouth every 8 (eight) hours as needed for muscle spasms. 90 each 1  . Blood Glucose Calibration (ACCU-CHEK SMARTVIEW CONTROL) LIQD Test blood sugar once daily. Dx code: E11.8. 1 each 3  . Blood Glucose Monitoring Suppl (ACCU-CHEK NANO SMARTVIEW) W/DEVICE KIT Test blood sugar daily.  Dx code: E11.8 1 kit 0  . carvedilol (COREG) 25 MG tablet Take 0.5 tablets (12.5 mg total) by mouth 2 (two) times daily with a meal. 45 tablet 2  . colchicine 0.6 MG tablet Take 1.2 mg by mouth daily as needed (Gout). Colcrys    . diclofenac (FLECTOR) 1.3 % PTCH Place 1 patch onto the skin  2 (two) times daily as needed (pain).    Marland Kitchen docusate sodium 100 MG CAPS Take 100 mg by mouth 2 (two) times daily. 10 capsule 0  . glipiZIDE (GLUCOTROL) 5 MG tablet Take 1 tablet (5 mg total) by mouth 2 (two) times daily. 180 tablet 3  . glucose blood (ACCU-CHEK SMARTVIEW) test strip Test blood sugar once daily. Dx code: E11.8 100 each 3  . Hawthorne Berry 550 MG CAPS Take 1,100 mg by mouth 2 (two) times daily.    . isosorbide mononitrate (IMDUR) 60 MG 24 hr tablet TAKE 1 TABLET (60 MG TOTAL) BY MOUTH DAILY. 90 tablet 2  . lisinopril (PRINIVIL,ZESTRIL) 2.5 MG tablet TAKE 1 TABLET (2.5 MG TOTAL) BY MOUTH 2 (TWO) TIMES DAILY. 60 tablet 3  . meloxicam (MOBIC) 7.5 MG tablet Take 1 tablet (7.5 mg total) by mouth daily. 90 tablet 1  . metolazone (ZAROXOLYN) 5 MG tablet Take 1 tablet (5 mg total) by mouth daily as needed (Swelling). 30 tablet 2  . Misc Natural Products (TURMERIC CURCUMIN) CAPS Take 1 capsule by mouth 3 (three) times daily.    Marland Kitchen oxyCODONE-acetaminophen (PERCOCET) 10-325 MG per tablet Take 1 tablet by mouth 4 (four) times daily. scheduled    . polyethylene glycol powder (GLYCOLAX/MIRALAX) powder Take 17 g by mouth daily. 2550 g 0  . selenium 50 MCG TABS tablet Take 200 mcg by mouth daily.    Marland Kitchen torsemide (DEMADEX) 20 MG tablet Take 2 tablets (40 mg total) by mouth 2 (two) times daily. 360 tablet 2  . vitamin B-12 (CYANOCOBALAMIN) 250 MCG tablet Take 250 mcg by mouth daily.    . vitamin C (ASCORBIC ACID) 500 MG tablet Take 1,000 mg by mouth daily.    . hydrALAZINE (APRESOLINE) 50 MG tablet Take 1 tablet (50 mg total) by mouth 3 (three) times daily. (Patient not taking: Reported on 01/20/2015) 270 tablet 2  .  pregabalin (LYRICA) 75 MG capsule Take 75 mg by mouth daily.     No current facility-administered medications on file prior to visit.    Review of Systems:  As per HPI- otherwise negative.   Physical Examination: Filed Vitals:   01/20/15 0936  BP: 105/68  Pulse: 82  Temp: 98.2 F (36.8 C)  Resp: 18   Filed Vitals:   01/20/15 0936  Height: '5\' 10"'  (1.778 m)  Weight: 235 lb 3.2 oz (106.686 kg)   Body mass index is 33.75 kg/(m^2). Ideal Body Weight: Weight in (lb) to have BMI = 25: 173.9  GEN: WDWN, NAD, Non-toxic, A & O x 3, looks well, obese HEENT: Atraumatic, Normocephalic. Neck supple. No masses, No LAD.  Bilateral TM wnl, oropharynx normal.  PEERL,EOMI.   Ears and Nose: No external deformity. CV: RRR, No M/G/R. No JVD. No thrill. No extra heart sounds. PULM: CTA B, no wheezes, crackles, rhonchi. No retractions. No resp. distress. No accessory muscle use. EXTR: No c/c/e NEURO Normal gait.  PSYCH: Normally interactive. Conversant. Not depressed or anxious appearing.  Calm demeanor.    Assessment and Plan: Diabetes mellitus type 2, controlled, with complications - Plan: [facility] Diabetes Foot Exam, Comprehensive metabolic panel, Microalbumin, urine, Hemoglobin A1c  Chronic renal insufficiency, unspecified stage - Plan: Comprehensive metabolic panel  Await labs today and will follow-up with him.  Praised him to checking in with his cardiologist about his weight change and thus avoiding a more serious CHF exacerbation.   Plan to follow-up in 6 months assuming DM control is still ok.  Will follow-up  slight elevation in creatine today as well  Signed [name], MD  Called with labs- mild hypokalemia, likely due to increased recent diuretic use.  Will replace his K gently, he will come by for a K level in about one week.  Will also message his cardiologist about using a potassium sparing diuretic as part of his regimen Results for orders placed or performed in visit on  01/20/15  Comprehensive metabolic panel  Result Value Ref Range   Sodium 137 135 - 145 mEq/L   Potassium 3.2 (L) 3.5 - 5.3 mEq/L   Chloride 92 (L) 96 - 112 mEq/L   CO2 30 19 - 32 mEq/L   Glucose, Bld 109 (H) 70 - 99 mg/dL   BUN 43 (H) 6 - 23 mg/dL   Creat 2.25 (H) 0.50 - 1.35 mg/dL   Total Bilirubin 0.4 0.2 - 1.2 mg/dL   Alkaline Phosphatase 114 39 - 117 U/L   AST 31 0 - 37 U/L   ALT 27 0 - 53 U/L   Total Protein 7.8 6.0 - 8.3 g/dL   Albumin 4.3 3.5 - 5.2 g/dL   Calcium 9.9 8.4 - 10.5 mg/dL  Microalbumin, urine  Result Value Ref Range   Microalb, Ur 0.4 <2.0 mg/dL  Hemoglobin A1c  Result Value Ref Range   Hgb A1c MFr Bld 5.6 <5.7 %   Mean Plasma Glucose 114 <117 mg/dL   Pt called me on 3/1; he has some Kdur 20 meq, advised him to take one a day.  Per Dr. [name] he can go back on some spironolactone.  He is not sure if he has any of this at home but will see Dr. [name] tomorrow and will discuss with him then

## 2015-01-20 NOTE — Addendum Note (Signed)
Addended by: Abbe Amsterdam C on: 01/20/2015 09:09 PM   Modules accepted: Orders

## 2015-01-20 NOTE — Patient Instructions (Signed)
Always a pleasure to see you in clinic!  I will be in touch with your labs asap.  Take care and continue to follow your weight closely and contact your cardiologist if you have a change

## 2015-01-21 ENCOUNTER — Encounter: Payer: Self-pay | Admitting: Family Medicine

## 2015-01-22 ENCOUNTER — Other Ambulatory Visit (HOSPITAL_COMMUNITY): Payer: Medicare HMO

## 2015-02-03 ENCOUNTER — Ambulatory Visit (INDEPENDENT_AMBULATORY_CARE_PROVIDER_SITE_OTHER): Payer: Commercial Managed Care - HMO | Admitting: Family Medicine

## 2015-02-03 VITALS — BP 112/74 | HR 68 | Temp 97.6°F | Resp 20 | Ht 68.0 in | Wt 237.4 lb

## 2015-02-03 DIAGNOSIS — I5022 Chronic systolic (congestive) heart failure: Secondary | ICD-10-CM | POA: Diagnosis not present

## 2015-02-03 DIAGNOSIS — R062 Wheezing: Secondary | ICD-10-CM | POA: Diagnosis not present

## 2015-02-03 DIAGNOSIS — E876 Hypokalemia: Secondary | ICD-10-CM | POA: Diagnosis not present

## 2015-02-03 LAB — BASIC METABOLIC PANEL
BUN: 56 mg/dL — ABNORMAL HIGH (ref 6–23)
CO2: 28 meq/L (ref 19–32)
Calcium: 10 mg/dL (ref 8.4–10.5)
Chloride: 97 mEq/L (ref 96–112)
Creat: 2.85 mg/dL — ABNORMAL HIGH (ref 0.50–1.35)
Glucose, Bld: 69 mg/dL — ABNORMAL LOW (ref 70–99)
POTASSIUM: 4.2 meq/L (ref 3.5–5.3)
SODIUM: 140 meq/L (ref 135–145)

## 2015-02-03 MED ORDER — ALBUTEROL SULFATE HFA 108 (90 BASE) MCG/ACT IN AERS: 2.0000 | INHALATION_SPRAY | Freq: Four times a day (QID) | RESPIRATORY_TRACT | Status: DC | PRN

## 2015-02-03 MED ORDER — ALBUTEROL SULFATE (2.5 MG/3ML) 0.083% IN NEBU
2.5000 mg | INHALATION_SOLUTION | Freq: Once | RESPIRATORY_TRACT | Status: AC
  Administered 2015-02-03: 2.5 mg via RESPIRATORY_TRACT

## 2015-02-03 NOTE — Progress Notes (Addendum)
[facility] [address], [place] [address] [phone]  Date:  02/03/2015   Name:  Frank Davidson   DOB:  [birth year]   MRN:  [id number]  PCP:  [name], MD    Chief Complaint: Follow-up; Shortness of Breath; and Wheezing   History of Present Illness:  Frank Davidson is a 64 y.o. very pleasant male patient who presents with the following:  Here today to recheck his potassium; noted to have mild hypokalemia of 3.2 at his last labs 2 weeks ago.  He then added back sprinalactone back to his regimen. He also has noted some problems with wheezing and increase SOB.   He weighed 235 this am; he considers this to be "about" his baseline.  He had gotten up to about 238 and took a metolazone late last week- this did seem to help bring down his weight. His SOB is now better but he continues to wheeze esp with activity.  He dos not have any albuterol at home although he has used a nebulizer in the past     Lab Results  Component Value Date   HGBA1C 5.6 01/20/2015    Wt Readings from Last 3 Encounters:  02/03/15 237 lb 6.4 oz (107.684 kg)  01/20/15 235 lb 3.2 oz (106.686 kg)  11/26/14 232 lb 6.4 oz (105.416 kg)    Patient Active Problem List   Diagnosis Date Noted  . Acute dyspnea   . Non-ischemic cardiomyopathy- EF 15% echo Aug 2015 11/24/2014  . Chronic anticoagulation 11/24/2014  . SOB (shortness of breath)   . Weight gain   . Acute on chronic renal insufficiency 11/23/2014  . Syncope 10/19/2014  . S/P left THA, AA 09/12/2014  . Gout 04/29/2014  . PVD (peripheral vascular disease)- LE ABI's < 0.5 bilat 04/19/2014  . Ocular herpes   . Neuropathy   . Left-sided weakness   . OSA (obstructive sleep apnea) 07/09/2013  . Chronic systolic heart failure 91/69/4503  . 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 PAF (paroxysmal atrial fibrillation) 07/03/2013  . Hypertension   . COPD (chronic obstructive pulmonary disease)   . Diabetes mellitus with  nephropathy   . CKD (chronic kidney disease), stage III   . Acute on chronic systolic CHF (congestive heart failure)   . Polysubstance abuse     Past Medical History  Diagnosis Date  . Atrial fibrillation     PAF  . Hypertension   . COPD (chronic obstructive pulmonary disease)   . Ocular herpes   . Gout   . CHF (congestive heart failure)   . Chronic pain   . Hyperlipidemia   . Nonischemic cardiomyopathy Aug 2015    EF 15%  . Ocular herpes   . Neuropathy   . Left-sided weakness     "because of the arthritis and edema"  . PVD (peripheral vascular disease) May 2015    abnormal ABIs  . Memory impairment     "short term"  . CKD (chronic kidney disease), stage III     Dr. Mercy Moore follows-holding   . Fibromyalgia     neuropathy- hands, more than feet.  . Sleep apnea with use of continuous positive airway pressure (CPAP)     does not use cpap  . GERD (gastroesophageal reflux disease)     tums as needed  . Asthma   . Type II diabetes mellitus   . Stroke X 5    last stroke aug 2014"; denies residual  on 09/12/2014  . Osteoarthritis   . Arthritis     "knees, right elbow, shoulder" (09/12/2014)    Past Surgical History  Procedure Laterality Date  . 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 Tonsillectomy  age 5  . Esophagogastroduodenoscopy N/A 12/24/2013    Procedure: ESOPHAGOGASTRODUODENOSCOPY (EGD);  Surgeon: Inda Castle, MD;  Location: [facility];  Service: Endoscopy;  Laterality: N/A;  . Colonoscopy N/A 12/24/2013    Procedure: COLONOSCOPY;  Surgeon: Inda Castle, MD;  Location: [facility];  Service: Endoscopy;  Laterality: N/A;  . Joint replacement    . Cardiac catheterization  10/2006    normal coronary arteries;   Marland Kitchen Cataract extraction w/ intraocular lens  implant, bilateral Bilateral ~ 2008  . Total hip arthroplasty Left 09/12/2014    Procedure: LEFT TOTAL HIP ARTHROPLASTY ANTERIOR APPROACH;  Surgeon: Mauri Pole, MD;  Location: [facility];  Service: Orthopedics;   Laterality: Left;  . Right heart catheterization N/A 08/13/2014    Rt ht cath prior to hip surgery    History  Substance Use Topics  . Smoking status: Former Smoker -- 0.50 packs/day for 10 years    Types: Cigarettes    Quit date: 12/18/2006  . Smokeless tobacco: Never Used  . Alcohol Use: No     Comment: Quit 2008    Family History  Problem Relation Age of Onset  . Cancer Mother   . Hypertension Mother   . Lung disease Mother   . Diabetes Father   . Diabetes Sister   . Hypertension Sister   . Hypertension Brother   . Hypertension Sister   . Colon cancer Neg Hx     Allergies  Allergen Reactions  . Corlanor [Ivabradine] Palpitations and Other (See Comments)    Kidney and heart problems, syncope  . Januvia [Sitagliptin] Other (See Comments)    "Almost killed me"   . Lunesta [Eszopiclone] Palpitations and Other (See Comments)    Kidney and heart problems, syncope  . Actos [Pioglitazone] Other (See Comments)    Caused blood in urine and fluid retention     Medication list has been reviewed and updated.  Current Outpatient Prescriptions on File Prior to Visit  Medication Sig Dispense Refill  . ACCU-CHEK FASTCLIX LANCETS MISC Test blood sugar once daily. Dx code: E11.8 100 each 3  . Alcohol Swabs (B-D SINGLE USE SWABS REGULAR) PADS Test blood sugar daily. Dx code: E11.8 100 each 3  . allopurinol (ZYLOPRIM) 100 MG tablet Take 1 tablet (100 mg total) by mouth daily with breakfast. 90 tablet 3  . apixaban (ELIQUIS) 5 MG TABS tablet Take 5 mg by mouth 2 (two) times daily.    . baclofen (LIORESAL) 10 MG tablet Take 1 tablet (10 mg total) by mouth every 8 (eight) hours as needed for muscle spasms. 90 each 1  . Blood Glucose Calibration (ACCU-CHEK SMARTVIEW CONTROL) LIQD Test blood sugar once daily. Dx code: E11.8. 1 each 3  . Blood Glucose Monitoring Suppl (ACCU-CHEK NANO SMARTVIEW) W/DEVICE KIT Test blood sugar daily. Dx code: E11.8 1 kit 0  . carvedilol (COREG) 25 MG tablet  Take 0.5 tablets (12.5 mg total) by mouth 2 (two) times daily with a meal. 45 tablet 2  . docusate sodium 100 MG CAPS Take 100 mg by mouth 2 (two) times daily. 10 capsule 0  . glipiZIDE (GLUCOTROL) 5 MG tablet Take 1 tablet (5 mg total) by mouth 2 (two) times daily. 180 tablet 3  .  glucose blood (ACCU-CHEK SMARTVIEW) test strip Test blood sugar once daily. Dx code: E11.8 100 each 3  . Hawthorne Berry 550 MG CAPS Take 1,100 mg by mouth 2 (two) times daily.    . hydrALAZINE (APRESOLINE) 50 MG tablet Take 1 tablet (50 mg total) by mouth 3 (three) times daily. 270 tablet 2  . isosorbide mononitrate (IMDUR) 60 MG 24 hr tablet TAKE 1 TABLET (60 MG TOTAL) BY MOUTH DAILY. 90 tablet 2  . lisinopril (PRINIVIL,ZESTRIL) 2.5 MG tablet TAKE 1 TABLET (2.5 MG TOTAL) BY MOUTH 2 (TWO) TIMES DAILY. 60 tablet 3  . meloxicam (MOBIC) 7.5 MG tablet Take 1 tablet (7.5 mg total) by mouth daily. 90 tablet 1  . metolazone (ZAROXOLYN) 5 MG tablet Take 1 tablet (5 mg total) by mouth daily as needed (Swelling). 30 tablet 2  . Misc Natural Products (TURMERIC CURCUMIN) CAPS Take 1 capsule by mouth 3 (three) times daily.    Marland Kitchen oxyCODONE-acetaminophen (PERCOCET) 10-325 MG per tablet Take 1 tablet by mouth 4 (four) times daily. scheduled    . polyethylene glycol powder (GLYCOLAX/MIRALAX) powder Take 17 g by mouth daily. 2550 g 0  . potassium chloride (K-DUR) 10 MEQ tablet Take 1 tablet (10 mEq total) by mouth 2 (two) times daily. 60 tablet 0  . pregabalin (LYRICA) 75 MG capsule Take 75 mg by mouth daily.    Marland Kitchen selenium 50 MCG TABS tablet Take 200 mcg by mouth daily.    Marland Kitchen torsemide (DEMADEX) 20 MG tablet Take 2 tablets (40 mg total) by mouth 2 (two) times daily. 360 tablet 2  . vitamin B-12 (CYANOCOBALAMIN) 250 MCG tablet Take 250 mcg by mouth daily.    . colchicine 0.6 MG tablet Take 1.2 mg by mouth daily as needed (Gout). Colcrys    . diclofenac (FLECTOR) 1.3 % PTCH Place 1 patch onto the skin 2 (two) times daily as needed (pain).      No current facility-administered medications on file prior to visit.    Review of Systems:  As per HPI- otherwise negative. Lab Results  Component Value Date   HGBA1C 5.6 01/20/2015     Physical Examination: Filed Vitals:   02/03/15 1322  BP: 112/74  Pulse: 68  Temp: 97.6 F (36.4 C)  Resp: 20   Filed Vitals:   02/03/15 1322  Height: 5' 8" (1.727 m)  Weight: 237 lb 6.4 oz (107.684 kg)   Body mass index is 36.1 kg/(m^2). Ideal Body Weight: Weight in (lb) to have BMI = 25: 164.1  GEN: WDWN, NAD, Non-toxic, A & O x 3, looks well today HEENT: Atraumatic, Normocephalic. Neck supple. No masses, No LAD. Ears and Nose: No external deformity. CV: RRR, No M/G/R. No JVD. No thrill. No extra heart sounds. PULM: CTA B, no wheezes, crackles, rhonchi. No retractions. No resp. distress. No accessory muscle use.  Clear to my exam. EXTR: No c/c.  Minimal to no LE edema NEURO Normal gait.  PSYCH: Normally interactive. Conversant. Not depressed or anxious appearing.  Calm demeanor.   Albuterol neb: improved his perceived wheezing  Assessment and Plan: Hypokalemia - Plan: Basic metabolic panel  Wheezing - Plan: albuterol (PROVENTIL) (2.5 MG/3ML) 0.083% nebulizer solution 2.5 mg, albuterol (PROVENTIL HFA;VENTOLIN HFA) 108 (90 BASE) MCG/ACT inhaler  Chronic systolic congestive heart failure  Here today to recheck his K- he has added back his spiro so hope hypokalemia will be resolved.  Also follow-up his renal function He has noted some wheezing and SOB- he does have COPD  at baseline in addition to his CHF.  At this time his lungs do not sound wet.  Will make sure his K is all right and then will have him take another dose of his metolazone if needed for symptoms and weight.  Albuterol inhaler to use as needed.   Will contact his with results and also need to confirm his current dose of spiro.   Signed [name], MD  Called on 3/15. Results for orders placed or performed  in visit on 26/71/24  Basic metabolic panel  Result Value Ref Range   Sodium 140 135 - 145 mEq/L   Potassium 4.2 3.5 - 5.3 mEq/L   Chloride 97 96 - 112 mEq/L   CO2 28 19 - 32 mEq/L   Glucose, Bld 69 (L) 70 - 99 mg/dL   BUN 56 (H) 6 - 23 mg/dL   Creat 2.85 (H) 0.50 - 1.35 mg/dL   Calcium 10.0 8.4 - 10.5 mg/dL   He reports his weight was 129 this am.  He is taking 53m of spironalactone a day.  Advised that his K looks good, he can stop taking potassium supplement now.  His breathing is ok.  He will see me on 4/4, sooner if any problems

## 2015-02-03 NOTE — Patient Instructions (Signed)
I will be in touch with your labs.  Assuming that your potassium looks ok we may want to have you use your metolazone for a couple of days   Use the albuterol inhaler as needed; let me know if you have any other problems  Please schedule a physical with me when you can

## 2015-02-04 ENCOUNTER — Other Ambulatory Visit (HOSPITAL_COMMUNITY): Payer: Self-pay | Admitting: *Deleted

## 2015-02-04 MED ORDER — APIXABAN 5 MG PO TABS: 5.0000 mg | ORAL_TABLET | Freq: Two times a day (BID) | ORAL | Status: DC

## 2015-02-04 NOTE — Addendum Note (Signed)
Addended by: Abbe Amsterdam C on: 02/04/2015 02:49 PM   Modules accepted: Medications

## 2015-02-12 ENCOUNTER — Telehealth (HOSPITAL_COMMUNITY): Payer: Self-pay | Admitting: Vascular Surgery

## 2015-02-12 NOTE — Telephone Encounter (Signed)
Pt left message  about Eliquis samples.. Please advise

## 2015-02-17 ENCOUNTER — Encounter (HOSPITAL_COMMUNITY): Payer: Commercial Managed Care - HMO

## 2015-02-17 ENCOUNTER — Encounter: Payer: Self-pay | Admitting: Internal Medicine

## 2015-02-17 NOTE — Progress Notes (Signed)
Per [employer] patient was approved for Eliquis 02/14/15-11/22/2015 free of charge. I will send to medical records also.

## 2015-02-18 ENCOUNTER — Encounter: Payer: Self-pay | Admitting: Internal Medicine

## 2015-02-19 ENCOUNTER — Ambulatory Visit (INDEPENDENT_AMBULATORY_CARE_PROVIDER_SITE_OTHER): Payer: Commercial Managed Care - HMO

## 2015-02-19 ENCOUNTER — Ambulatory Visit (INDEPENDENT_AMBULATORY_CARE_PROVIDER_SITE_OTHER): Payer: Commercial Managed Care - HMO | Admitting: Family Medicine

## 2015-02-19 VITALS — BP 130/78 | HR 87 | Temp 97.4°F | Resp 16 | Ht 68.0 in | Wt 238.6 lb

## 2015-02-19 DIAGNOSIS — R062 Wheezing: Secondary | ICD-10-CM | POA: Diagnosis not present

## 2015-02-19 DIAGNOSIS — G4733 Obstructive sleep apnea (adult) (pediatric): Secondary | ICD-10-CM | POA: Diagnosis not present

## 2015-02-19 DIAGNOSIS — R404 Transient alteration of awareness: Secondary | ICD-10-CM | POA: Diagnosis not present

## 2015-02-19 DIAGNOSIS — I5022 Chronic systolic (congestive) heart failure: Secondary | ICD-10-CM

## 2015-02-19 DIAGNOSIS — R109 Unspecified abdominal pain: Secondary | ICD-10-CM

## 2015-02-19 LAB — POCT UA - MICROSCOPIC ONLY
Bacteria, U Microscopic: NEGATIVE
Casts, Ur, LPF, POC: NEGATIVE
Crystals, Ur, HPF, POC: NEGATIVE
MUCUS UA: NEGATIVE
RBC, urine, microscopic: NEGATIVE
YEAST UA: NEGATIVE

## 2015-02-19 LAB — POCT URINALYSIS DIPSTICK
BILIRUBIN UA: NEGATIVE
Blood, UA: NEGATIVE
Glucose, UA: NEGATIVE
KETONES UA: NEGATIVE
LEUKOCYTES UA: NEGATIVE
Nitrite, UA: NEGATIVE
Protein, UA: NEGATIVE
Spec Grav, UA: 1.01
Urobilinogen, UA: 0.2
pH, UA: 6

## 2015-02-19 LAB — POCT CBC
GRANULOCYTE PERCENT: 65.1 % (ref 37–80)
HCT, POC: 43.6 % (ref 43.5–53.7)
HEMOGLOBIN: 13.3 g/dL — AB (ref 14.1–18.1)
Lymph, poc: 2.4 (ref 0.6–3.4)
MCH, POC: 27.3 pg (ref 27–31.2)
MCHC: 30.6 g/dL — AB (ref 31.8–35.4)
MCV: 89 fL (ref 80–97)
MID (cbc): 0.6 (ref 0–0.9)
MPV: 7.7 fL (ref 0–99.8)
POC GRANULOCYTE: 5.7 (ref 2–6.9)
POC LYMPH %: 27.3 % (ref 10–50)
POC MID %: 7.3 % (ref 0–12)
Platelet Count, POC: 206 10*3/uL (ref 142–424)
RBC: 4.9 M/uL (ref 4.69–6.13)
RDW, POC: 17.3 %
WBC: 8.8 10*3/uL (ref 4.6–10.2)

## 2015-02-19 MED ORDER — ALBUTEROL SULFATE HFA 108 (90 BASE) MCG/ACT IN AERS: 2.0000 | INHALATION_SPRAY | Freq: Four times a day (QID) | RESPIRATORY_TRACT | Status: DC | PRN

## 2015-02-19 MED ORDER — ALBUTEROL SULFATE (2.5 MG/3ML) 0.083% IN NEBU
2.5000 mg | INHALATION_SOLUTION | Freq: Once | RESPIRATORY_TRACT | Status: AC
  Administered 2015-02-19: 2.5 mg via RESPIRATORY_TRACT

## 2015-02-19 NOTE — Patient Instructions (Signed)
Please fill your inhaler and use as needed for wheezing.  I will be in touch with the rest of your labs Your urine looks ok- I think your side pain is likely due to muscle pain. Take care and please seek help if you are getting worse!   I am going to refer you to a sleep doctor for a sleep test; they will probably need to set you up with a CPAP machine I suspect that your sleep apnea is part of why you are sleeping so poorly Please purchase some doxylamine OTC (for sleep) and try taking 25- 50 mg before bed

## 2015-02-19 NOTE — Progress Notes (Addendum)
[facility] [address] [place] [address] [phone]  Date:  02/19/2015   Name:  Frank Davidson   DOB:  22-May-1951   MRN:  [id number]  PCP:  [name], MD    Chief Complaint: Wheezing; Shortness of Breath; and Abdominal Pain   History of Present Illness:  Frank Davidson is a 64 y.o. very pleasant male patient who presents with the following:  History of severe CHF with relatively frequent exacerbations.  Most recent echo 11/2014: Study Conclusions  - Left ventricle: The cavity size was normal. Wall thickness was normal. Systolic function was severely reduced. The estimated ejection fraction was 20%. LV apical false tendon. Severe global hypokinesis. The study is not technically sufficient to allow evaluation of LV diastolic function. - Left atrium: Severely dilated at 50 ml/m2. - Right atrium: Moderately dilated at 24 cm2.  Impressions:  - Compared to the prior echo in 2014, there has been little if any improvement in EF to around 20%, severe global hypokinesis, biatrial enlargement.  He notes pain in his right flank for about 3 days.  It hurts when he moves, bends. No pain with cough.  He has never had a kidney stone.  Admits that he has been doing some abdominal exercises and may have strained his muscles  He has not noted any blood in his urine, no dysuria.  Eating does not make him worse at all.    He had noted 4-5 lbs of weight gain a few days ago, but he took a metalozone and this seemed to help.    He does not notice a fever.   Also here today because he has noted some wheezing again over the last 3-4 days.  Some cough.  He has noted just trace edema in his legs.  No orthopnea.  He denies any CP He did not fill the albuterol that I rx at our last visit- he just forgot to get it filled.  Would like to have a neb and inhaler rx today  Noted by my CMA to have a ?pre- syncopal vs somnolence event where he seemed to  fall asleep during his neb, started to fall but caught himself.  Pierre admits that he has had several of this type of episode recently.  "I think I'm falling asleep." He admits that he has a dx of OSA, but has no CPAP machine and has not been treated for OSA in a long while.  Admits that he does not sleep well- partially due to having to urinate several times each night.  Notes that he will easily fall asleep during the day "if I sit quietly for even a couple of minutes."  Wt Readings from Last 3 Encounters:  02/19/15 238 lb 9.6 oz (108.228 kg)  02/03/15 237 lb 6.4 oz (107.684 kg)  01/20/15 235 lb 3.2 oz (106.686 kg)     Patient Active Problem List   Diagnosis Date Noted  . Acute dyspnea   . Non-ischemic cardiomyopathy- EF 15% echo Aug 2015 11/24/2014  . Chronic anticoagulation 11/24/2014  . SOB (shortness of breath)   . Weight gain   . Acute on chronic renal insufficiency 11/23/2014  . Syncope 10/19/2014  . S/P left THA, AA 09/12/2014  . Gout 04/29/2014  . PVD (peripheral vascular disease)- LE ABI's < 0.5 bilat 04/19/2014  . Ocular herpes   . Neuropathy   . Left-sided weakness   . OSA (obstructive sleep apnea) 07/09/2013  . Chronic systolic heart  failure 07/05/2013  . 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 PAF (paroxysmal atrial fibrillation) 07/03/2013  . Hypertension   . COPD (chronic obstructive pulmonary disease)   . Diabetes mellitus with nephropathy   . CKD (chronic kidney disease), stage III   . Acute on chronic systolic CHF (congestive heart failure)   . Polysubstance abuse     Past Medical History  Diagnosis Date  . Atrial fibrillation     PAF  . Hypertension   . COPD (chronic obstructive pulmonary disease)   . Ocular herpes   . Gout   . CHF (congestive heart failure)   . Chronic pain   . Hyperlipidemia   . Nonischemic cardiomyopathy Aug 2015    EF 15%  . Ocular herpes   . Neuropathy   . Left-sided weakness     "because of the arthritis and edema"  . PVD  (peripheral vascular disease) May 2015    abnormal ABIs  . Memory impairment     "short term"  . CKD (chronic kidney disease), stage III     Dr. Mercy Moore follows-holding   . Fibromyalgia     neuropathy- hands, more than feet.  . Sleep apnea with use of continuous positive airway pressure (CPAP)     does not use cpap  . GERD (gastroesophageal reflux disease)     tums as needed  . Asthma   . Type II diabetes mellitus   . Stroke X 5    last stroke aug 2014"; denies residual on 09/12/2014  . Osteoarthritis   . Arthritis     "knees, right elbow, shoulder" (09/12/2014)    Past Surgical History  Procedure Laterality Date  . 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 Tonsillectomy  age 66  . Esophagogastroduodenoscopy N/A 12/24/2013    Procedure: ESOPHAGOGASTRODUODENOSCOPY (EGD);  Surgeon: Inda Castle, MD;  Location: [facility];  Service: Endoscopy;  Laterality: N/A;  . Colonoscopy N/A 12/24/2013    Procedure: COLONOSCOPY;  Surgeon: Inda Castle, MD;  Location: [facility];  Service: Endoscopy;  Laterality: N/A;  . Joint replacement    . Cardiac catheterization  10/2006    normal coronary arteries;   Marland Kitchen Cataract extraction w/ intraocular lens  implant, bilateral Bilateral ~ 2008  . Total hip arthroplasty Left 09/12/2014    Procedure: LEFT TOTAL HIP ARTHROPLASTY ANTERIOR APPROACH;  Surgeon: Mauri Pole, MD;  Location: [facility];  Service: Orthopedics;  Laterality: Left;  . Right heart catheterization N/A 08/13/2014    Rt ht cath prior to hip surgery    History  Substance Use Topics  . Smoking status: Former Smoker -- 0.50 packs/day for 10 years    Types: Cigarettes    Quit date: 12/18/2006  . Smokeless tobacco: Never Used  . Alcohol Use: No     Comment: Quit 2008    Family History  Problem Relation Age of Onset  . Cancer Mother   . Hypertension Mother   . Lung disease Mother   . Diabetes Father   . Diabetes Sister   . Hypertension Sister   . Hypertension Brother   .  Hypertension Sister   . Colon cancer Neg Hx     Allergies  Allergen Reactions  . Corlanor [Ivabradine] Palpitations and Other (See Comments)    Kidney and heart problems, syncope  . Januvia [Sitagliptin] Other (See Comments)    "Almost killed me"   . Lunesta [Eszopiclone] Palpitations and Other (See Comments)  Kidney and heart problems, syncope  . Actos [Pioglitazone] Other (See Comments)    Caused blood in urine and fluid retention     Medication list has been reviewed and updated.  Current Outpatient Prescriptions on File Prior to Visit  Medication Sig Dispense Refill  . ACCU-CHEK FASTCLIX LANCETS MISC Test blood sugar once daily. Dx code: E11.8 100 each 3  . Alcohol Swabs (B-D SINGLE USE SWABS REGULAR) PADS Test blood sugar daily. Dx code: E11.8 100 each 3  . allopurinol (ZYLOPRIM) 100 MG tablet Take 1 tablet (100 mg total) by mouth daily with breakfast. 90 tablet 3  . apixaban (ELIQUIS) 5 MG TABS tablet Take 1 tablet (5 mg total) by mouth 2 (two) times daily. 180 tablet 3  . baclofen (LIORESAL) 10 MG tablet Take 1 tablet (10 mg total) by mouth every 8 (eight) hours as needed for muscle spasms. 90 each 1  . Blood Glucose Calibration (ACCU-CHEK SMARTVIEW CONTROL) LIQD Test blood sugar once daily. Dx code: E11.8. 1 each 3  . Blood Glucose Monitoring Suppl (ACCU-CHEK NANO SMARTVIEW) W/DEVICE KIT Test blood sugar daily. Dx code: E11.8 1 kit 0  . carvedilol (COREG) 25 MG tablet Take 0.5 tablets (12.5 mg total) by mouth 2 (two) times daily with a meal. 45 tablet 2  . docusate sodium 100 MG CAPS Take 100 mg by mouth 2 (two) times daily. 10 capsule 0  . glipiZIDE (GLUCOTROL) 5 MG tablet Take 1 tablet (5 mg total) by mouth 2 (two) times daily. 180 tablet 3  . glucose blood (ACCU-CHEK SMARTVIEW) test strip Test blood sugar once daily. Dx code: E11.8 100 each 3  . Hawthorne Berry 550 MG CAPS Take 1,100 mg by mouth 2 (two) times daily.    . hydrALAZINE (APRESOLINE) 50 MG tablet Take 1 tablet  (50 mg total) by mouth 3 (three) times daily. 270 tablet 2  . isosorbide mononitrate (IMDUR) 60 MG 24 hr tablet TAKE 1 TABLET (60 MG TOTAL) BY MOUTH DAILY. 90 tablet 2  . lisinopril (PRINIVIL,ZESTRIL) 2.5 MG tablet TAKE 1 TABLET (2.5 MG TOTAL) BY MOUTH 2 (TWO) TIMES DAILY. 60 tablet 3  . meloxicam (MOBIC) 7.5 MG tablet Take 1 tablet (7.5 mg total) by mouth daily. 90 tablet 1  . metolazone (ZAROXOLYN) 5 MG tablet Take 1 tablet (5 mg total) by mouth daily as needed (Swelling). 30 tablet 2  . Misc Natural Products (TURMERIC CURCUMIN) CAPS Take 1 capsule by mouth 3 (three) times daily.    Marland Kitchen oxyCODONE-acetaminophen (PERCOCET) 10-325 MG per tablet Take 1 tablet by mouth 4 (four) times daily. scheduled    . polyethylene glycol powder (GLYCOLAX/MIRALAX) powder Take 17 g by mouth daily. 2550 g 0  . pregabalin (LYRICA) 75 MG capsule Take 75 mg by mouth daily.    Marland Kitchen selenium 50 MCG TABS tablet Take 200 mcg by mouth daily.    Marland Kitchen spironolactone (ALDACTONE) 25 MG tablet Take 25 mg by mouth daily.    Marland Kitchen torsemide (DEMADEX) 20 MG tablet Take 2 tablets (40 mg total) by mouth 2 (two) times daily. 360 tablet 2  . vitamin B-12 (CYANOCOBALAMIN) 250 MCG tablet Take 250 mcg by mouth daily.    Marland Kitchen albuterol (PROVENTIL HFA;VENTOLIN HFA) 108 (90 BASE) MCG/ACT inhaler Inhale 2 puffs into the lungs every 6 (six) hours as needed for wheezing or shortness of breath. (Patient not taking: Reported on 02/19/2015) 1 Inhaler 0  . colchicine 0.6 MG tablet Take 1.2 mg by mouth daily as needed (Gout). Colcrys    .  diclofenac (FLECTOR) 1.3 % PTCH Place 1 patch onto the skin 2 (two) times daily as needed (pain).    . potassium chloride (K-DUR) 10 MEQ tablet Take 1 tablet (10 mEq total) by mouth 2 (two) times daily. (Patient not taking: Reported on 02/19/2015) 60 tablet 0   No current facility-administered medications on file prior to visit.    Review of Systems:  As per HPI- otherwise negative.   Physical Examination: Filed Vitals:    02/19/15 1027  BP: 130/78  Pulse: 87  Temp: 97.4 F (36.3 C)  Resp: 16   Filed Vitals:   02/19/15 1027  Height: '5\' 8"'  (1.727 m)  Weight: 238 lb 9.6 oz (108.228 kg)   Body mass index is 36.29 kg/(m^2). Ideal Body Weight: Weight in (lb) to have BMI = 25: 164.1  GEN: WDWN, NAD, Non-toxic, A & O x 3, obese man who is his usual pleasant self.   HEENT: Atraumatic, Normocephalic. Neck supple. No masses, No LAD. Ears and Nose: No external deformity. CV: RRR, No M/G/R. No JVD. No thrill. No extra heart sounds. PULM: No crackles, rhonchi. No retractions. No resp. distress. No accessory muscle use.  Bilateral end expiratory wheezes.   ABD: S, NT, ND, +BS. No rebound. No HSM. He is tender over the left oblique abdominal muscles. Point tenderness.  OW his belly is benign.  This does seem to be muscular soreness EXTR: No c/c/e NEURO Normal gait.  PSYCH: Normally interactive. Conversant. Not depressed or anxious appearing.  Calm demeanor.   Albuterol neb:  Helped with cough, still with some end- expiratory wheezing 2nd neb given after about an hour: improved more  EKG: 1st degree AV block with some ectopic ventricular beats, low voltage.  PR interval is actually shorter than at last EKG  [facility] reading (PRIMARY) by  Dr. Lorelei Pont. CXR: obese, no effusion or sign of CHF exacerbation  CHEST 2 VIEW  COMPARISON: November 23, 2014  FINDINGS: There is stable scarring in the mid lung regions bilaterally. There is no edema or consolidation. Heart is upper normal in size with pulmonary vascularity within normal limits. No adenopathy. There is degenerative change in the thoracic spine.  IMPRESSION: Scarring both and lungs. No edema or consolidation. Heart upper normal in size.  Results for orders placed or performed in visit on 02/19/15  POCT CBC  Result Value Ref Range   WBC 8.8 4.6 - 10.2 K/uL   Lymph, poc 2.4 0.6 - 3.4   POC LYMPH PERCENT 27.3 10 - 50 %L   MID (cbc) 0.6 0 - 0.9   POC MID  % 7.3 0 - 12 %M   POC Granulocyte 5.7 2 - 6.9   Granulocyte percent 65.1 37 - 80 %G   RBC 4.90 4.69 - 6.13 M/uL   Hemoglobin 13.3 (A) 14.1 - 18.1 g/dL   HCT, POC 43.6 43.5 - 53.7 %   MCV 89.0 80 - 97 fL   MCH, POC 27.3 27 - 31.2 pg   MCHC 30.6 (A) 31.8 - 35.4 g/dL   RDW, POC 17.3 %   Platelet Count, POC 206 142 - 424 K/uL   MPV 7.7 0 - 99.8 fL  POCT UA - Microscopic Only  Result Value Ref Range   WBC, Ur, HPF, POC 0-1    RBC, urine, microscopic neg    Bacteria, U Microscopic neg    Mucus, UA neg    Epithelial cells, urine per micros 2-4    Crystals, Ur, HPF, POC neg  Casts, Ur, LPF, POC neg    Yeast, UA neg   POCT urinalysis dipstick  Result Value Ref Range   Color, UA yellow    Clarity, UA clear    Glucose, UA neg    Bilirubin, UA neg    Ketones, UA neg    Spec Grav, UA 1.010    Blood, UA neg    pH, UA 6.0    Protein, UA neg    Urobilinogen, UA 0.2    Nitrite, UA neg    Leukocytes, UA Negative    Assessment and Plan: Wheezing - Plan: DG Chest 2 View, albuterol (PROVENTIL) (2.5 MG/3ML) 0.083% nebulizer solution 2.5 mg, albuterol (PROVENTIL HFA;VENTOLIN HFA) 108 (90 BASE) MCG/ACT inhaler  Right flank pain - Plan: POCT CBC, POCT UA - Microscopic Only, POCT urinalysis dipstick, Urine culture, Comprehensive metabolic panel  Transient alteration of awareness - Plan: EKG 63-[facility]  Chronic systolic congestive heart failure - Plan: Brain natriuretic peptide  OSA (obstructive sleep apnea) - Plan: Ambulatory referral to Sleep Studies   Wheezing improved with albuterol neb X2.  Given rx for proair inhaler and coupon to get one free.  At this time I do not see fluid in his lungs and his weight is close to baseline.  Ordered BNP, will follow- up with him closely Suspect his flank pain is due to sore muscles.  No evidence of nephrolithiasis on his UA.  Check CMP and urine culture, treat conservatively for now Suspect this his apparent "pre-syncope" is actually due to him  starting to fall asleep secondary to untreated sleep apnea and fatigue.  Referral to sleep medicine, cautioned him to avoid driving when he is sleepy. He notes that he wakes frequently during the night, sometimes to urinate and other times for no reason, he may have a hard time getting back to sleep then.  He has a history of syncope while on lunesta so hesitate to use another benzo or hypnotic for sleep.  Suggested that he try OTC doxylamine for sleep as needed  Signed [name], MD  Called to check on him 3/31.  He reports that he is feeling well and is down 4 lbs.  His BNP is high but not extremely so- he likely has an abnormal BNP at baseline.   He will keep me updated if he becomes ill again.  Mild anemia but he had a normal colon just a couple of months ago, likely due to CKD.  He is a pt of Dr. Mercy Moore at [facility]

## 2015-02-20 ENCOUNTER — Telehealth: Payer: Self-pay

## 2015-02-20 ENCOUNTER — Other Ambulatory Visit: Payer: Self-pay | Admitting: *Deleted

## 2015-02-20 ENCOUNTER — Encounter: Payer: Self-pay | Admitting: Family Medicine

## 2015-02-20 LAB — URINE CULTURE
Colony Count: NO GROWTH
ORGANISM ID, BACTERIA: NO GROWTH

## 2015-02-20 LAB — COMPREHENSIVE METABOLIC PANEL
ALBUMIN: 4.7 g/dL (ref 3.5–5.2)
ALT: 18 U/L (ref 0–53)
AST: 40 U/L — ABNORMAL HIGH (ref 0–37)
Alkaline Phosphatase: 82 U/L (ref 39–117)
BUN: 71 mg/dL — AB (ref 6–23)
CALCIUM: 10 mg/dL (ref 8.4–10.5)
CHLORIDE: 95 meq/L — AB (ref 96–112)
CO2: 32 mEq/L (ref 19–32)
CREATININE: 2.66 mg/dL — AB (ref 0.50–1.35)
GLUCOSE: 87 mg/dL (ref 70–99)
POTASSIUM: 3.8 meq/L (ref 3.5–5.3)
Sodium: 140 mEq/L (ref 135–145)
Total Bilirubin: 0.4 mg/dL (ref 0.2–1.2)
Total Protein: 7.7 g/dL (ref 6.0–8.3)

## 2015-02-20 LAB — BRAIN NATRIURETIC PEPTIDE: Brain Natriuretic Peptide: 320.9 pg/mL — ABNORMAL HIGH (ref 0.0–100.0)

## 2015-02-20 NOTE — Telephone Encounter (Signed)
Pt of Dr. Patsy Lager gave pt a coupon for albuterol (PROVENTIL HFA;VENTOLIN HFA) 108 (90 BASE) MCG/ACT inhaler [[id number]], but it was expired. Would like to know if someone could give him a new one

## 2015-02-21 NOTE — Telephone Encounter (Signed)
Rx was sent in on 02/19/2015. No coupons. Called pt to let him know. Left a message.

## 2015-02-24 ENCOUNTER — Ambulatory Visit (INDEPENDENT_AMBULATORY_CARE_PROVIDER_SITE_OTHER): Payer: Commercial Managed Care - HMO | Admitting: Family Medicine

## 2015-02-24 ENCOUNTER — Encounter: Payer: Self-pay | Admitting: Family Medicine

## 2015-02-24 VITALS — BP 98/58 | HR 65 | Temp 98.0°F | Resp 18 | Ht 67.75 in | Wt 231.2 lb

## 2015-02-24 DIAGNOSIS — E118 Type 2 diabetes mellitus with unspecified complications: Secondary | ICD-10-CM | POA: Diagnosis not present

## 2015-02-24 DIAGNOSIS — Z125 Encounter for screening for malignant neoplasm of prostate: Secondary | ICD-10-CM

## 2015-02-24 DIAGNOSIS — Z0000 Encounter for general adult medical examination without abnormal findings: Secondary | ICD-10-CM | POA: Diagnosis not present

## 2015-02-24 MED ORDER — PREGABALIN 75 MG PO CAPS: 75.0000 mg | ORAL_CAPSULE | Freq: Every day | ORAL | Status: DC

## 2015-02-24 NOTE — Patient Instructions (Signed)
Great to see you today!  Let me know if you have any problems or concerns You are about due for your eye exam- please do this soon

## 2015-02-24 NOTE — Progress Notes (Signed)
[facility] [address] [place] [address] [phone]  Date:  02/24/2015   Name:  Frank Davidson   DOB:  1950-12-02   MRN:  [id number]  PCP:  [name], MD    Chief Complaint: Follow-up and Abdominal Pain   History of Present Illness:  Frank Davidson is a 64 y.o. very pleasant male patient who presents with the following:  Here today for a periodic recheck. He is feeling better than when saw him on 3/30 with wheezing.  He is sleeping better- however he continues to wake up 2-4x a night for various reasons.   His weight is back down  He will see Dr. Mercy Moore next month for his 6 month recheck He is UTD on A1c, Lipids He is due for an eye exam and plans to do this soon Lab Results  Component Value Date   HGBA1C 5.6 01/20/2015   His DM is well- controlled on a small dose of glipizide Wt Readings from Last 3 Encounters:  02/24/15 231 lb 3.2 oz (104.872 kg)  02/19/15 238 lb 9.6 oz (108.228 kg)  02/03/15 237 lb 6.4 oz (107.684 kg)      Patient Active Problem List   Diagnosis Date Noted  . Acute dyspnea   . Non-ischemic cardiomyopathy- EF 15% echo Aug 2015 11/24/2014  . Chronic anticoagulation 11/24/2014  . SOB (shortness of breath)   . Weight gain   . Acute on chronic renal insufficiency 11/23/2014  . Syncope 10/19/2014  . S/P left THA, AA 09/12/2014  . Gout 04/29/2014  . PVD (peripheral vascular disease)- LE ABI's < 0.5 bilat 04/19/2014  . Ocular herpes   . Neuropathy   . Left-sided weakness   . OSA (obstructive sleep apnea) 07/09/2013  . Chronic systolic heart failure 09/47/0962  . 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 PAF (paroxysmal atrial fibrillation) 07/03/2013  . Hypertension   . COPD (chronic obstructive pulmonary disease)   . Diabetes mellitus with nephropathy   . CKD (chronic kidney disease), stage III   . Acute on chronic systolic CHF (congestive heart failure)   . Polysubstance abuse     Past Medical History   Diagnosis Date  . Atrial fibrillation     PAF  . Hypertension   . COPD (chronic obstructive pulmonary disease)   . Ocular herpes   . Gout   . CHF (congestive heart failure)   . Chronic pain   . Hyperlipidemia   . Nonischemic cardiomyopathy Aug 2015    EF 15%  . Ocular herpes   . Neuropathy   . Left-sided weakness     "because of the arthritis and edema"  . PVD (peripheral vascular disease) May 2015    abnormal ABIs  . Memory impairment     "short term"  . CKD (chronic kidney disease), stage III     Dr. Mercy Moore follows-holding   . Fibromyalgia     neuropathy- hands, more than feet.  . Sleep apnea with use of continuous positive airway pressure (CPAP)     does not use cpap  . GERD (gastroesophageal reflux disease)     tums as needed  . Asthma   . Type II diabetes mellitus   . Stroke X 5    last stroke aug 2014"; denies residual on 09/12/2014  . Osteoarthritis   . Arthritis     "knees, right elbow, shoulder" (09/12/2014)    Past Surgical History  Procedure Laterality Date  . Orchiectomy  Left     as a child  . Tonsillectomy  age 57  . Esophagogastroduodenoscopy N/A 12/24/2013    Procedure: ESOPHAGOGASTRODUODENOSCOPY (EGD);  Surgeon: Inda Castle, MD;  Location: [facility];  Service: Endoscopy;  Laterality: N/A;  . Colonoscopy N/A 12/24/2013    Procedure: COLONOSCOPY;  Surgeon: Inda Castle, MD;  Location: [facility];  Service: Endoscopy;  Laterality: N/A;  . Joint replacement    . Cardiac catheterization  10/2006    normal coronary arteries;   Marland Kitchen Cataract extraction w/ intraocular lens  implant, bilateral Bilateral ~ 2008  . Total hip arthroplasty Left 09/12/2014    Procedure: LEFT TOTAL HIP ARTHROPLASTY ANTERIOR APPROACH;  Surgeon: Mauri Pole, MD;  Location: [facility];  Service: Orthopedics;  Laterality: Left;  . Right heart catheterization N/A 08/13/2014    Rt ht cath prior to hip surgery    History  Substance Use Topics  . Smoking status: Former Smoker  -- 0.50 packs/day for 10 years    Types: Cigarettes    Quit date: 12/18/2006  . Smokeless tobacco: Never Used  . Alcohol Use: No     Comment: Quit 2008    Family History  Problem Relation Age of Onset  . Cancer Mother   . Hypertension Mother   . Lung disease Mother   . Diabetes Father   . Diabetes Sister   . Hypertension Sister   . Hypertension Brother   . Hypertension Sister   . Colon cancer Neg Hx     Allergies  Allergen Reactions  . Corlanor [Ivabradine] Palpitations and Other (See Comments)    Kidney and heart problems, syncope  . Januvia [Sitagliptin] Other (See Comments)    "Almost killed me"   . Lunesta [Eszopiclone] Palpitations and Other (See Comments)    Kidney and heart problems, syncope  . Actos [Pioglitazone] Other (See Comments)    Caused blood in urine and fluid retention     Medication list has been reviewed and updated.  Current Outpatient Prescriptions on File Prior to Visit  Medication Sig Dispense Refill  . allopurinol (ZYLOPRIM) 100 MG tablet Take 1 tablet (100 mg total) by mouth daily with breakfast. 90 tablet 3  . apixaban (ELIQUIS) 5 MG TABS tablet Take 1 tablet (5 mg total) by mouth 2 (two) times daily. 180 tablet 3  . baclofen (LIORESAL) 10 MG tablet Take 1 tablet (10 mg total) by mouth every 8 (eight) hours as needed for muscle spasms. 90 each 1  . carvedilol (COREG) 25 MG tablet Take 0.5 tablets (12.5 mg total) by mouth 2 (two) times daily with a meal. 45 tablet 2  . docusate sodium 100 MG CAPS Take 100 mg by mouth 2 (two) times daily. 10 capsule 0  . glipiZIDE (GLUCOTROL) 5 MG tablet Take 1 tablet (5 mg total) by mouth 2 (two) times daily. 180 tablet 3  . glucose blood (ACCU-CHEK SMARTVIEW) test strip Test blood sugar once daily. Dx code: E11.8 100 each 3  . Hawthorne Berry 550 MG CAPS Take 1,100 mg by mouth 2 (two) times daily.    . hydrALAZINE (APRESOLINE) 50 MG tablet Take 1 tablet (50 mg total) by mouth 3 (three) times daily. 270 tablet 2   . isosorbide mononitrate (IMDUR) 60 MG 24 hr tablet TAKE 1 TABLET (60 MG TOTAL) BY MOUTH DAILY. 90 tablet 2  . lisinopril (PRINIVIL,ZESTRIL) 2.5 MG tablet TAKE 1 TABLET (2.5 MG TOTAL) BY MOUTH 2 (TWO) TIMES DAILY. 60 tablet 3  . meloxicam (MOBIC)  7.5 MG tablet Take 1 tablet (7.5 mg total) by mouth daily. 90 tablet 1  . metolazone (ZAROXOLYN) 5 MG tablet Take 1 tablet (5 mg total) by mouth daily as needed (Swelling). 30 tablet 2  . Misc Natural Products (TURMERIC CURCUMIN) CAPS Take 1 capsule by mouth 3 (three) times daily.    Marland Kitchen oxyCODONE-acetaminophen (PERCOCET) 10-325 MG per tablet Take 1 tablet by mouth 4 (four) times daily. scheduled    . pregabalin (LYRICA) 75 MG capsule Take 75 mg by mouth daily.    Marland Kitchen selenium 50 MCG TABS tablet Take 200 mcg by mouth daily.    Marland Kitchen spironolactone (ALDACTONE) 25 MG tablet Take 25 mg by mouth daily.    Marland Kitchen torsemide (DEMADEX) 20 MG tablet Take 2 tablets (40 mg total) by mouth 2 (two) times daily. 360 tablet 2  . vitamin B-12 (CYANOCOBALAMIN) 250 MCG tablet Take 250 mcg by mouth daily.    Marland Kitchen ACCU-CHEK FASTCLIX LANCETS MISC Test blood sugar once daily. Dx code: E11.8 100 each 3  . albuterol (PROVENTIL HFA;VENTOLIN HFA) 108 (90 BASE) MCG/ACT inhaler Inhale 2 puffs into the lungs every 6 (six) hours as needed for wheezing or shortness of breath. 1 Inhaler 1  . Alcohol Swabs (B-D SINGLE USE SWABS REGULAR) PADS Test blood sugar daily. Dx code: E11.8 100 each 3  . Blood Glucose Calibration (ACCU-CHEK SMARTVIEW CONTROL) LIQD Test blood sugar once daily. Dx code: E11.8. 1 each 3  . Blood Glucose Monitoring Suppl (ACCU-CHEK NANO SMARTVIEW) W/DEVICE KIT Test blood sugar daily. Dx code: E11.8 1 kit 0  . colchicine 0.6 MG tablet Take 1.2 mg by mouth daily as needed (Gout). Colcrys    . diclofenac (FLECTOR) 1.3 % PTCH Place 1 patch onto the skin 2 (two) times daily as needed (pain).    . polyethylene glycol powder (GLYCOLAX/MIRALAX) powder Take 17 g by mouth daily. (Patient not  taking: Reported on 02/24/2015) 2550 g 0  . potassium chloride (K-DUR) 10 MEQ tablet Take 1 tablet (10 mEq total) by mouth 2 (two) times daily. (Patient not taking: Reported on 02/24/2015) 60 tablet 0   No current facility-administered medications on file prior to visit.    Review of Systems:  As per HPI- otherwise negative.   Physical Examination: Filed Vitals:   02/24/15 0846  BP: 98/58  Pulse: 65  Temp: 98 F (36.7 C)  Resp: 18   Filed Vitals:   02/24/15 0846  Height: 5' 7.75" (1.721 m)  Weight: 231 lb 3.2 oz (104.872 kg)   Body mass index is 35.41 kg/(m^2). Ideal Body Weight: Weight in (lb) to have BMI = 25: 162.9  GEN: WDWN, NAD, Non-toxic, A & O x 3, obese, looks well HEENT: Atraumatic, Normocephalic. Neck supple. No masses, No LAD.  Bilateral TM wnl, oropharynx normal.  PEERL,EOMI.   Ears and Nose: No external deformity. CV: RRR, No M/G/R. No JVD. No thrill. No extra heart sounds. PULM: CTA B, no wheezes, crackles, rhonchi. No retractions. No resp. distress. No accessory muscle use. ABD: S, NT, ND. No rebound. No HSM. EXTR: No c/c/e NEURO Normal gait.  PSYCH: Normally interactive. Conversant. Not depressed or anxious appearing.  Calm demeanor.  He declined a GU exam but would like to have a PSA level  Assessment and Plan: Diabetes mellitus type 2, controlled, with complications - Plan: HM Diabetes Foot Exam  Screening for prostate cancer - Plan: PSA  Physical exam  Here today for a CPE.  He is a complex pt with CHF,  COPD, DM, CKD.  His EF is quite low and he tends to have frequent exacerbations of his CHF.  He is doing well today. Asked for paper chart and found that he is due for a td- will do this at next visit Will follow-up with PSA results  BP Readings from Last 3 Encounters:  02/24/15 98/58  02/19/15 130/78  02/03/15 112/74   His BP is a bit low but he states that he feels fine and is not lightheaded  Signed [name], MD

## 2015-02-25 ENCOUNTER — Telehealth (HOSPITAL_COMMUNITY): Payer: Self-pay | Admitting: *Deleted

## 2015-02-25 ENCOUNTER — Encounter: Payer: Self-pay | Admitting: Family Medicine

## 2015-02-25 LAB — PSA: PSA: 0.23 ng/mL (ref <=4.00)

## 2015-02-25 NOTE — Telephone Encounter (Signed)
Received letter from [facility], pt [facility] and will be receiving Eliquis free from them until 11/22/15

## 2015-02-27 ENCOUNTER — Other Ambulatory Visit: Payer: Self-pay | Admitting: *Deleted

## 2015-02-27 NOTE — Patient Outreach (Signed)
[facility]) Care Management   02/27/2015  Frank Davidson December 25, 1950 [id number]  Frank Davidson is an 64 y.o. male  Subjective:  HF: Pt reports he continues to do well with no acute issues or problems encountered. States he continues to weigh daily and document all readings for providers to view. Also documents when he is delayed with bowel movements that may affect his daily weights. Pt states he continues to take all his prescribed medications with no problems or issues. Pt able to manager his HF with knowledge on the HF zones and reports he is in the GREEN zone today.  MEDICATION: Pt reports he obtain his Elquis medication through the company where he place his application and has been complaint with taking his Spirolactone medication.   Pt very appreciative and grateful for the [facility] services and agree to graduate from [facility] services with all goals met as discussed over the last few months.   Objective:   Review of Systems  All other systems reviewed and are negative.   Physical Exam  Constitutional: He is oriented to person, place, and time. He appears well-developed and well-nourished.  Neck: Normal range of motion.  Cardiovascular: Normal rate, regular rhythm, normal heart sounds and intact distal pulses.   Respiratory: Effort normal and breath sounds normal.  GI: Soft. Bowel sounds are normal.  Musculoskeletal: Normal range of motion.  Neurological: He is alert and oriented to person, place, and time. He has normal reflexes.  Skin: Skin is warm and dry.  Psychiatric: He has a normal mood and affect. His behavior is normal. Judgment and thought content normal.    Current Medications:   Current Outpatient Prescriptions  Medication Sig Dispense Refill  . ACCU-CHEK FASTCLIX LANCETS MISC Test blood sugar once daily. Dx code: E11.8 100 each 3  . Alcohol Swabs (B-D SINGLE USE SWABS REGULAR) PADS Test blood sugar daily. Dx code: E11.8 100 each 3  . allopurinol  (ZYLOPRIM) 100 MG tablet Take 1 tablet (100 mg total) by mouth daily with breakfast. 90 tablet 3  . apixaban (ELIQUIS) 5 MG TABS tablet Take 1 tablet (5 mg total) by mouth 2 (two) times daily. 180 tablet 3  . baclofen (LIORESAL) 10 MG tablet Take 1 tablet (10 mg total) by mouth every 8 (eight) hours as needed for muscle spasms. 90 each 1  . Blood Glucose Calibration (ACCU-CHEK SMARTVIEW CONTROL) LIQD Test blood sugar once daily. Dx code: E11.8. 1 each 3  . Blood Glucose Monitoring Suppl (ACCU-CHEK NANO SMARTVIEW) W/DEVICE KIT Test blood sugar daily. Dx code: E11.8 1 kit 0  . carvedilol (COREG) 25 MG tablet Take 0.5 tablets (12.5 mg total) by mouth 2 (two) times daily with a meal. 45 tablet 2  . colchicine 0.6 MG tablet Take 1.2 mg by mouth daily as needed (Gout). Colcrys    . diclofenac (FLECTOR) 1.3 % PTCH Place 1 patch onto the skin 2 (two) times daily as needed (pain).    Marland Kitchen docusate sodium 100 MG CAPS Take 100 mg by mouth 2 (two) times daily. 10 capsule 0  . glipiZIDE (GLUCOTROL) 5 MG tablet Take 1 tablet (5 mg total) by mouth 2 (two) times daily. 180 tablet 3  . glucose blood (ACCU-CHEK SMARTVIEW) test strip Test blood sugar once daily. Dx code: E11.8 100 each 3  . Hawthorne Berry 550 MG CAPS Take 1,100 mg by mouth 2 (two) times daily.    . hydrALAZINE (APRESOLINE) 50 MG tablet Take 1 tablet (50 mg total)  by mouth 3 (three) times daily. 270 tablet 2  . isosorbide mononitrate (IMDUR) 60 MG 24 hr tablet TAKE 1 TABLET (60 MG TOTAL) BY MOUTH DAILY. 90 tablet 2  . lisinopril (PRINIVIL,ZESTRIL) 2.5 MG tablet TAKE 1 TABLET (2.5 MG TOTAL) BY MOUTH 2 (TWO) TIMES DAILY. 60 tablet 3  . meloxicam (MOBIC) 7.5 MG tablet Take 1 tablet (7.5 mg total) by mouth daily. 90 tablet 1  . metolazone (ZAROXOLYN) 5 MG tablet Take 1 tablet (5 mg total) by mouth daily as needed (Swelling). 30 tablet 2  . Misc Natural Products (TURMERIC CURCUMIN) CAPS Take 1 capsule by mouth 3 (three) times daily.    Marland Kitchen  oxyCODONE-acetaminophen (PERCOCET) 10-325 MG per tablet Take 1 tablet by mouth 4 (four) times daily. scheduled    . polyethylene glycol powder (GLYCOLAX/MIRALAX) powder Take 17 g by mouth daily. 2550 g 0  . pregabalin (LYRICA) 75 MG capsule Take 1 capsule (75 mg total) by mouth daily. 90 capsule 3  . selenium 50 MCG TABS tablet Take 200 mcg by mouth daily.    Marland Kitchen spironolactone (ALDACTONE) 25 MG tablet Take 25 mg by mouth daily.    Marland Kitchen torsemide (DEMADEX) 20 MG tablet Take 2 tablets (40 mg total) by mouth 2 (two) times daily. 360 tablet 2  . vitamin B-12 (CYANOCOBALAMIN) 250 MCG tablet Take 250 mcg by mouth daily.    Marland Kitchen albuterol (PROVENTIL HFA;VENTOLIN HFA) 108 (90 BASE) MCG/ACT inhaler Inhale 2 puffs into the lungs every 6 (six) hours as needed for wheezing or shortness of breath. (Patient not taking: Reported on 02/27/2015) 1 Inhaler 1  . potassium chloride (K-DUR) 10 MEQ tablet Take 1 tablet (10 mEq total) by mouth 2 (two) times daily. (Patient not taking: Reported on 02/24/2015) 60 tablet 0   No current facility-administered medications for this visit.    Functional Status:   In your present state of health, do you have any difficulty performing the following activities: 02/27/2015 11/23/2014  Is the patient deaf or have difficulty hearing? N N  Hearing N N  Vision N N  Difficulty concentrating or making decisions N N  Walking or climbing stairs? N N  Doing errands, shopping? N Y  Conservation officer, nature and eating ? N -  Using the Toilet? N -  In the past six months, have you accidently leaked urine? N -  Do you have problems with loss of bowel control? N -  Managing your Medications? N -  Managing your Finances? N -  Housekeeping or managing your Housekeeping? N -    Fall/Depression Screening:    PHQ 2/9 Scores 02/27/2015 02/24/2015 04/22/2014  PHQ - 2 Score 0 0 0    Assessment:   Follow up on management of care related to HF Follow up on ongoing medication issues and adherence  Plan:  Physical  assessment completed with no acute issues or problems to address at this time.  Will verify pt's understanding in self managing his HF and awareness of what to do if acute signs or symptoms should occur concerning the HF zones. Will also continue to encouraged adherence with daily weights and document all readings for his providers to view. Will inquire if any additional information or services are needed concerning his self management of care.  Will also verify pt's has received and continues to take his prescribed meditations concerning his Elquis and Spirolactone.  Based upon pt's discussions and adherence with managing his care over the last several months pt will be graduating from  the [facility] services today with all goals met.  No other request or inquires at this time as pt continues to do well. Case will be closed and primary doctor will be notified. Pt aware if other or existing medical conditional resurface with exacerbated episodes [facility] will be available to assist in the future if needed.   Raina Mina, RN Care Management Coordinator [facility] Main Office [phone]

## 2015-03-03 ENCOUNTER — Ambulatory Visit (HOSPITAL_COMMUNITY)
Admission: RE | Admit: 2015-03-03 | Discharge: 2015-03-03 | Disposition: A | Discharge status: Home / Self Care | Payer: Commercial Managed Care - HMO | Source: Ambulatory Visit | Attending: Cardiology | Admitting: Cardiology

## 2015-03-03 VITALS — BP 105/68 | HR 94 | Wt 235.8 lb

## 2015-03-03 DIAGNOSIS — I739 Peripheral vascular disease, unspecified: Secondary | ICD-10-CM | POA: Diagnosis not present

## 2015-03-03 DIAGNOSIS — N183 Chronic kidney disease, stage 3 unspecified: Secondary | ICD-10-CM

## 2015-03-03 DIAGNOSIS — I480 Paroxysmal atrial fibrillation: Secondary | ICD-10-CM | POA: Diagnosis not present

## 2015-03-03 DIAGNOSIS — Z79899 Other long term (current) drug therapy: Secondary | ICD-10-CM | POA: Insufficient documentation

## 2015-03-03 DIAGNOSIS — Z7902 Long term (current) use of antithrombotics/antiplatelets: Secondary | ICD-10-CM | POA: Insufficient documentation

## 2015-03-03 DIAGNOSIS — M797 Fibromyalgia: Secondary | ICD-10-CM | POA: Diagnosis not present

## 2015-03-03 DIAGNOSIS — E785 Hyperlipidemia, unspecified: Secondary | ICD-10-CM | POA: Insufficient documentation

## 2015-03-03 DIAGNOSIS — M109 Gout, unspecified: Secondary | ICD-10-CM | POA: Diagnosis not present

## 2015-03-03 DIAGNOSIS — I129 Hypertensive chronic kidney disease with stage 1 through stage 4 chronic kidney disease, or unspecified chronic kidney disease: Secondary | ICD-10-CM | POA: Diagnosis not present

## 2015-03-03 DIAGNOSIS — Z8673 Personal history of transient ischemic attack (TIA), and cerebral infarction without residual deficits: Secondary | ICD-10-CM | POA: Insufficient documentation

## 2015-03-03 DIAGNOSIS — I429 Cardiomyopathy, unspecified: Secondary | ICD-10-CM | POA: Diagnosis not present

## 2015-03-03 DIAGNOSIS — I5022 Chronic systolic (congestive) heart failure: Secondary | ICD-10-CM | POA: Diagnosis present

## 2015-03-03 DIAGNOSIS — E669 Obesity, unspecified: Secondary | ICD-10-CM | POA: Insufficient documentation

## 2015-03-03 DIAGNOSIS — K219 Gastro-esophageal reflux disease without esophagitis: Secondary | ICD-10-CM | POA: Diagnosis not present

## 2015-03-03 DIAGNOSIS — J449 Chronic obstructive pulmonary disease, unspecified: Secondary | ICD-10-CM | POA: Insufficient documentation

## 2015-03-03 DIAGNOSIS — E119 Type 2 diabetes mellitus without complications: Secondary | ICD-10-CM | POA: Insufficient documentation

## 2015-03-03 NOTE — Patient Instructions (Signed)
STOP Lisinopril  Your physician recommends that you schedule a follow-up appointment in: 3 months with a echocardiogram  Your physician has requested that you have an echocardiogram. Echocardiography is a painless test that uses sound waves to create images of your heart. It provides your doctor with information about the size and shape of your heart and how well your heart's chambers and valves are working. This procedure takes approximately one hour. There are no restrictions for this procedure.  Do the following things EVERYDAY: 1) Weigh yourself in the morning before breakfast. Write it down and keep it in a log. 2) Take your medicines as prescribed 3) Eat low salt foods-Limit salt (sodium) to 2000 mg per day.  4) Stay as active as you can everyday 5) Limit all fluids for the day to less than 2 liters 6)

## 2015-03-03 NOTE — Progress Notes (Signed)
Patient ID: Frank Davidson, male   DOB: 1951/05/13, 64 y.o.   MRN: [id number]  PCP: Roxy Cedar FNP GI: Dr Reed Breech Nephrologist: Dr. Mercy Moore  HPI: Frank Davidson is a 64 yo male with a hx of obesity, PAF, HTN, COPD, HTN, gout, DM 2, CVA and CHF due to NICM. Intolerant Entresto at the lowest dose due to syncope.   Has previously followed in the [facility] in [place]. Previous cath in 2007 showed NICM with normal cors. Was living alone in [place] and apparently had issues managing medications by himself and was getting confused with PCP and cardiologists medications and was not taking his meds correctly. He moved to [place] to be near his nieces. Issues in the past with ACE-I due to renal failure. Highest creatinine in our system is ~1.5 but was apparently higher previously - thought to be related to taking his medications incorrectly.   Was admitted in 06/2013 from [facility] with left sided weakness and was found to have a moderate to large right hemispheric infarct. Went back to [facility].   Admitted to [facility] 10/14 through 09/09/13 with ADHF. Diuresed 30 pounds with IV lasix and transitioned to lasix 80 mg po bid. Continued on 25 mg carvedilol bid, isordil 20 mg daily.  Started on lisinopril 2.5 mg daily. Discharge weight 229 pounds. 09/29/13 CT abdomen and pelvis - no obstruction . Diverticulosis. No inflammatory changes.   Admitted 11/23/13 through 11/26/13 with volume overload and dyspnea. Had worsening cardiorenal syndrome. Nephrology consulted and managed with IV lasix and transitioned to torsemide 40 mg twice a day.  Diuresed 25 pounds. Discharge weight 232 pounds. Hydralazine, Spiro, and Lisinopril stopped at discharge. He is also off digoxin.    He returns for follow up. Overall doing well. Mild dyspnea with exertion. + orthopnea sleeping on 1-2 pillows. Denies PND. No bleeding problems. NO BRBPR. Weight at home 228-241 pounds. Had metolazone. Says he has been eating what he wants  and gaining weight. Taking all medications.    ECHO 11/08/13 EF 15%  ECHO 07/05/14: EF 15%  ECHO 11/26/2014: EF 20%   08/13/14 RA = 7  RV = 50/3/10  PA = 54/22 (34)  PCW = 16  Fick cardiac output/index = 6.9/3.0  Thermo CO/CI = 4.7/2.1  PVR = 4.1 WU  Ao sat = 94%  PA sat = 68%,68%  SVC sat (sheath) = 62%   Labs 10/01/13 Dig level 2.5 --->dig stopped Labs 10/05/13 K 4.8 Creatinine 1.79 Labs 11/01/13 K 3.3 --Supplemented Creatinine 1.3 Uric Acid 9.3 AST 23 ALT 12 Hemoglobin 13 Hemoglobin 7.2  Labs 11/21/13 K 4.4 Creatinine 1.3            01/02/14 K 4.2, Creatinine 1.1            01/10/14 K 4.6, Creatinine 1.28           04/22/14 K 4.0 Creatinine 1.58         05/13/14 K 4.3, creatinine 1.46, pro-BNP 1377          06/24/14: K 3.9, creatinine 1.49         07/02/14 K 4.5, creatinine 1.82, pro-BNP 696.7         07/16/14 K 3.7, creatinine 2.41         08/01/14 K 4.3 creatinine 2.6 (demadex held for 2 days) - then restarted at 40 bid (baseline dose)         08/22/14 K 4.4 creatinine 1.6  09/02/14 Creatinine 1.6        10/20/14: creatinine 2.58, K 5.1         11/23/2014: Creatinine 4.12 --Ace and Spiro stopped          11/24/2014: Creatinine 3.55          1/4/12016: 2.49          11/26/2014: Hgb 10.4 Hct 31.7 Creatinine 1.94          12/03/14: K 4, creatinine 2.25         02/19/2015: K 3.8 Creatinine 2.66   SH: Frank Davidson, [employer] 2012 Lives with his niece  FH: Father DM  Mom had lung cancer  ROS: All systems negative except as listed in HPI, PMH and Problem List.  Past Medical History  Diagnosis Date  . Atrial fibrillation     PAF  . Hypertension   . COPD (chronic obstructive pulmonary disease)   . Ocular herpes   . Gout   . CHF (congestive heart failure)   . Chronic pain   . Hyperlipidemia   . Nonischemic cardiomyopathy Aug 2015    EF 15%  . Ocular herpes   . Neuropathy   . Left-sided weakness     "because of the arthritis and edema"  . PVD (peripheral vascular disease) May  2015    abnormal ABIs  . Memory impairment     "short term"  . CKD (chronic kidney disease), stage III     Dr. Mercy Moore follows-holding   . Fibromyalgia     neuropathy- hands, more than feet.  . Sleep apnea with use of continuous positive airway pressure (CPAP)     does not use cpap  . GERD (gastroesophageal reflux disease)     tums as needed  . Asthma   . Type II diabetes mellitus   . Stroke X 5    last stroke aug 2014"; denies residual on 09/12/2014  . Osteoarthritis   . Arthritis     "knees, right elbow, shoulder" (09/12/2014)    Current Outpatient Prescriptions  Medication Sig Dispense Refill  . ACCU-CHEK FASTCLIX LANCETS MISC Test blood sugar once daily. Dx code: E11.8 100 each 3  . Alcohol Swabs (B-D SINGLE USE SWABS REGULAR) PADS Test blood sugar daily. Dx code: E11.8 100 each 3  . allopurinol (ZYLOPRIM) 100 MG tablet Take 1 tablet (100 mg total) by mouth daily with breakfast. 90 tablet 3  . apixaban (ELIQUIS) 5 MG TABS tablet Take 1 tablet (5 mg total) by mouth 2 (two) times daily. 180 tablet 3  . baclofen (LIORESAL) 10 MG tablet Take 1 tablet (10 mg total) by mouth every 8 (eight) hours as needed for muscle spasms. 90 each 1  . Blood Glucose Calibration (ACCU-CHEK SMARTVIEW CONTROL) LIQD Test blood sugar once daily. Dx code: E11.8. 1 each 3  . Blood Glucose Monitoring Suppl (ACCU-CHEK NANO SMARTVIEW) W/DEVICE KIT Test blood sugar daily. Dx code: E11.8 1 kit 0  . carvedilol (COREG) 25 MG tablet Take 0.5 tablets (12.5 mg total) by mouth 2 (two) times daily with a meal. 45 tablet 2  . colchicine 0.6 MG tablet Take 1.2 mg by mouth daily as needed (Gout). Colcrys    . diclofenac (FLECTOR) 1.3 % PTCH Place 1 patch onto the skin 2 (two) times daily as needed (pain).    Marland Kitchen docusate sodium 100 MG CAPS Take 100 mg by mouth 2 (two) times daily. 10 capsule 0  . glipiZIDE (GLUCOTROL) 5 MG tablet Take 1 tablet (5  mg total) by mouth 2 (two) times daily. 180 tablet 3  . glucose blood  (ACCU-CHEK SMARTVIEW) test strip Test blood sugar once daily. Dx code: E11.8 100 each 3  . Hawthorne Berry 550 MG CAPS Take 1,100 mg by mouth 2 (two) times daily.    . hydrALAZINE (APRESOLINE) 50 MG tablet Take 1 tablet (50 mg total) by mouth 3 (three) times daily. 270 tablet 2  . isosorbide mononitrate (IMDUR) 60 MG 24 hr tablet TAKE 1 TABLET (60 MG TOTAL) BY MOUTH DAILY. 90 tablet 2  . lisinopril (PRINIVIL,ZESTRIL) 2.5 MG tablet TAKE 1 TABLET (2.5 MG TOTAL) BY MOUTH 2 (TWO) TIMES DAILY. 60 tablet 3  . meloxicam (MOBIC) 7.5 MG tablet Take 1 tablet (7.5 mg total) by mouth daily. 90 tablet 1  . metolazone (ZAROXOLYN) 5 MG tablet Take 1 tablet (5 mg total) by mouth daily as needed (Swelling). 30 tablet 2  . Misc Natural Products (TURMERIC CURCUMIN) CAPS Take 1 capsule by mouth 3 (three) times daily.    Marland Kitchen oxyCODONE-acetaminophen (PERCOCET) 10-325 MG per tablet Take 1 tablet by mouth 4 (four) times daily. scheduled    . polyethylene glycol powder (GLYCOLAX/MIRALAX) powder Take 17 g by mouth daily. 2550 g 0  . potassium chloride (K-DUR) 10 MEQ tablet Take 1 tablet (10 mEq total) by mouth 2 (two) times daily. 60 tablet 0  . pregabalin (LYRICA) 75 MG capsule Take 1 capsule (75 mg total) by mouth daily. 90 capsule 3  . selenium 50 MCG TABS tablet Take 200 mcg by mouth daily.    Marland Kitchen spironolactone (ALDACTONE) 25 MG tablet Take 25 mg by mouth daily.    Marland Kitchen torsemide (DEMADEX) 20 MG tablet Take 2 tablets (40 mg total) by mouth 2 (two) times daily. 360 tablet 2  . vitamin B-12 (CYANOCOBALAMIN) 250 MCG tablet Take 250 mcg by mouth daily.    Marland Kitchen albuterol (PROVENTIL HFA;VENTOLIN HFA) 108 (90 BASE) MCG/ACT inhaler Inhale 2 puffs into the lungs every 6 (six) hours as needed for wheezing or shortness of breath. (Patient not taking: Reported on 03/03/2015) 1 Inhaler 1   No current facility-administered medications for this encounter.    Filed Vitals:   03/03/15 1050  BP: 105/68  Pulse: 94  Weight: 235 lb 12.8 oz  (106.958 kg)  SpO2: 98%   PHYSICAL EXAM: General:  Obese male; Ambulated into clinic with a cane,  HEENT: normal Neck: supple. JVP difficult to assess d/t body habitus but does not appear elevated.  Carotids 2+ bilaterally; no bruits. No lymphadenopathy or thryomegaly appreciated. Cor: PMI normal. Regular rate & rhythm. No rubs, gallops or murmurs. Lungs: clear Abdomen: obese soft, nontender, mildly distended. No hepatosplenomegaly. No bruits or masses. Good bowel sounds. Extremities: no cyanosis, clubbing, rash, tr bilateral  edema. Compression in place bilaterally.  Neuro: alert & orientedx3, cranial nerves grossly intact. Affect pleasant.   ASSESSMENT & PLAN:  1) Chronic Systolic Heart Failure: nonischemic cardiomyopathy, EF 20% 1/16. NYHA II symptoms and volume status stable.  - Will continue torsemide 40 mg BID with metolazone as needed.  - Continue coreg 12.5 mg BID. Has not tolerated up titration in the past due to fatigue. - Continue Imdur + hdralazine to 50 mg tid.   - Off digoxin due to CKD.  - Put back spiro by PCP due to hypokalemia. Today I will stop Ace.  - Syncope in past with Entresto.  Corlanor also stopped during recent admit.  - He remains firm in his decision to not pursue  ICD.  - Reinforced the need and importance of daily weights, a low sodium diet and to avoid soups, and fluid restriction (less than 2 L a day). Instructed to call the HF clinic if weight increases more than 3 lbs overnight or 5 lbs in a week.  2) Gout: Continue allopurinol. Continue to follow with rheumatology.  3) Atrial fibrillation: Regular rhythm. Continue Eliquis. Recent CBC stable on 02/19/2015.  4) CKD stage III: Baseline 1.9-2.4. He has nephrology follow up.   Follow up in 3 months   CLEGG,AMY NP-C  03/03/2015

## 2015-03-19 ENCOUNTER — Encounter: Payer: Self-pay | Admitting: *Deleted

## 2015-03-20 ENCOUNTER — Telehealth (HOSPITAL_COMMUNITY): Payer: Self-pay | Admitting: Vascular Surgery

## 2015-03-20 ENCOUNTER — Other Ambulatory Visit: Payer: Self-pay | Admitting: Family Medicine

## 2015-03-20 NOTE — Telephone Encounter (Signed)
Refill Spironlactone sent to [facility] pharmacy

## 2015-03-20 NOTE — Telephone Encounter (Signed)
[phone]  Checking on status of his med requests by pharmacy.

## 2015-03-24 ENCOUNTER — Other Ambulatory Visit (HOSPITAL_COMMUNITY): Payer: Self-pay | Admitting: Internal Medicine

## 2015-03-24 MED ORDER — SPIRONOLACTONE 25 MG PO TABS: 25.0000 mg | ORAL_TABLET | Freq: Every day | ORAL | Status: DC

## 2015-03-26 ENCOUNTER — Other Ambulatory Visit: Payer: Self-pay | Admitting: Family Medicine

## 2015-03-28 ENCOUNTER — Telehealth: Payer: Self-pay | Admitting: Family Medicine

## 2015-03-28 DIAGNOSIS — K5900 Constipation, unspecified: Secondary | ICD-10-CM

## 2015-03-28 MED ORDER — POLYETHYLENE GLYCOL 3350 17 GM/SCOOP PO POWD
ORAL | Status: DC
Start: 2015-03-28 — End: 2015-07-28

## 2015-03-28 NOTE — Telephone Encounter (Signed)
Called and spoke with pt and he stated that he uses the [facility] mail order pharmacy that is listed in his chart and needed a refill of miralax and spironlactone, but this was sent in by Dr. Clarise Cruz on 03/24/2015 to the [facility] pharmacy.

## 2015-03-28 NOTE — Telephone Encounter (Signed)
Patient states his pharmacy has been trying to contact Dr. Patsy Lager about getting a refill on two of his medications (patient states that one of the medicines is Miralax, the other he does not know). He uses the mail order pharmacy through [place].  [phone]

## 2015-04-03 ENCOUNTER — Telehealth (HOSPITAL_COMMUNITY): Payer: Self-pay | Admitting: *Deleted

## 2015-04-03 NOTE — Telephone Encounter (Signed)
FYI: pt stated he forgot to take metolazone for extra fluid but he took it today and will call back and let us know if that helped with fluid.

## 2015-04-17 ENCOUNTER — Other Ambulatory Visit (HOSPITAL_COMMUNITY): Payer: Self-pay | Admitting: Internal Medicine

## 2015-05-02 ENCOUNTER — Encounter: Payer: Self-pay | Admitting: Internal Medicine

## 2015-05-05 ENCOUNTER — Ambulatory Visit (INDEPENDENT_AMBULATORY_CARE_PROVIDER_SITE_OTHER): Payer: Commercial Managed Care - HMO | Admitting: Family Medicine

## 2015-05-05 VITALS — BP 109/68 | HR 71 | Temp 97.5°F | Resp 20 | Ht 68.5 in | Wt 239.4 lb

## 2015-05-05 DIAGNOSIS — I5023 Acute on chronic systolic (congestive) heart failure: Secondary | ICD-10-CM | POA: Diagnosis not present

## 2015-05-05 LAB — POCT CBC
Granulocyte percent: 61.7 %G (ref 37–80)
HCT, POC: 42.9 % — AB (ref 43.5–53.7)
Hemoglobin: 13.6 g/dL — AB (ref 14.1–18.1)
Lymph, poc: 2.5 (ref 0.6–3.4)
MCH, POC: 27.5 pg (ref 27–31.2)
MCHC: 31.7 g/dL — AB (ref 31.8–35.4)
MCV: 86.7 fL (ref 80–97)
MID (cbc): 0.8 (ref 0–0.9)
MPV: 7.8 fL (ref 0–99.8)
POC Granulocyte: 5.4 (ref 2–6.9)
POC LYMPH PERCENT: 29.3 %L (ref 10–50)
POC MID %: 9 %M (ref 0–12)
Platelet Count, POC: 225 10*3/uL (ref 142–424)
RBC: 4.95 M/uL (ref 4.69–6.13)
RDW, POC: 17.9 %
WBC: 8.7 10*3/uL (ref 4.6–10.2)

## 2015-05-05 NOTE — Progress Notes (Signed)
° °  Subjective:    Patient ID: Frank Davidson, male    DOB: 12/20/1950, 64 y.o.   MRN: [id number] This chart was scribed for Frank Sidle, MD by Littie Deeds, Medical Scribe. This patient was seen in Room 10 and the patient's care was started at 7:20 PM.   HPI HPI Comments: [name] is a 64 y.o. male with a hx of chronic kidney disease, CHF and COPD who presents to the [facility] complaining of gradual onset, mild SOB that started about a week ago. Patient has also had some unsteadiness with his gait and some difficulty sleeping, feeling restless. The SOB is worse when laying down. Patient denies nausea. He also denies smoking and alcohol use. Patient states his blood sugars have been fine, usually around 130. His last A1c was 1-2 months ago. He was admitted to the hospital 5 months ago for SOB and weight gain.  Cardiologist: Dr. Gala Romney  Patient is retired. 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  Review of Systems  Respiratory: Positive for shortness of breath.   Gastrointestinal: Negative for nausea.  Musculoskeletal: Positive for gait problem.  Neurological: Negative for syncope.  Psychiatric/Behavioral: Positive for sleep disturbance.       Objective:   Physical Exam CONSTITUTIONAL: Well developed/well nourished. No acute distress. HEAD: Normocephalic/atraumatic EYES: EOM/PERRL ENMT: Mucous membranes moist NECK: supple no meningeal signs SPINE: entire spine nontender CV: S3 gallop LUNGS: Bibasilar rales. ABDOMEN: soft, nontender, no rebound or guarding GU: no cva tenderness NEURO: Pt is awake/alert, moves all extremitiesx4 EXTREMITIES: Trace edema SKIN: warm, color normal PSYCH: no abnormalities of mood noted  Results for orders placed or performed in visit on 05/05/15  POCT CBC  Result Value Ref Range   WBC 8.7 4.6 - 10.2 K/uL   Lymph, poc 2.5 0.6 - 3.4   POC LYMPH PERCENT 29.3 10 - 50 %L   MID  (cbc) 0.8 0 - 0.9   POC MID % 9.0 0 - 12 %M   POC Granulocyte 5.4 2 - 6.9   Granulocyte percent 61.7 37 - 80 %G   RBC 4.95 4.69 - 6.13 M/uL   Hemoglobin 13.6 (A) 14.1 - 18.1 g/dL   HCT, POC 00.4 (A) 59.9 - 53.7 %   MCV 86.7 80 - 97 fL   MCH, POC 27.5 27 - 31.2 pg   MCHC 31.7 (A) 31.8 - 35.4 g/dL   RDW, POC 77.4 %   Platelet Count, POC 225 142 - 424 K/uL   MPV 7.8 0 - 99.8 fL        Assessment & Plan:   This chart was scribed in my presence and reviewed by me personally. Patient does not seem be in any acute distress    ICD-9-CM ICD-10-CM   1. Acute on chronic systolic congestive heart failure 428.23 I50.23 POCT CBC   428.0  COMPLETE METABOLIC PANEL WITH GFR     Brain natriuretic peptide   We'll check labs in the morning and call patient regarding further treatment.  Signed, Frank Sidle, MD

## 2015-05-05 NOTE — Patient Instructions (Signed)
We'll call you tomorrow regarding the labs that were done tonight. The initial labs look pretty good.

## 2015-05-06 ENCOUNTER — Other Ambulatory Visit: Payer: Self-pay | Admitting: Radiology

## 2015-05-06 ENCOUNTER — Telehealth: Payer: Self-pay | Admitting: Radiology

## 2015-05-06 DIAGNOSIS — R062 Wheezing: Secondary | ICD-10-CM

## 2015-05-06 LAB — COMPLETE METABOLIC PANEL WITH GFR
ALT: 23 U/L (ref 0–53)
AST: 32 U/L (ref 0–37)
Albumin: 4.9 g/dL (ref 3.5–5.2)
Alkaline Phosphatase: 92 U/L (ref 39–117)
BUN: 75 mg/dL — ABNORMAL HIGH (ref 6–23)
CO2: 29 mEq/L (ref 19–32)
Calcium: 10.1 mg/dL (ref 8.4–10.5)
Chloride: 95 mEq/L — ABNORMAL LOW (ref 96–112)
Creat: 2.68 mg/dL — ABNORMAL HIGH (ref 0.50–1.35)
GFR, Est African American: 28 mL/min — ABNORMAL LOW
GFR, Est Non African American: 24 mL/min — ABNORMAL LOW
Glucose, Bld: 71 mg/dL (ref 70–99)
Potassium: 3.4 mEq/L — ABNORMAL LOW (ref 3.5–5.3)
Sodium: 137 mEq/L (ref 135–145)
Total Bilirubin: 0.4 mg/dL (ref 0.2–1.2)
Total Protein: 8.6 g/dL — ABNORMAL HIGH (ref 6.0–8.3)

## 2015-05-06 MED ORDER — ALBUTEROL SULFATE HFA 108 (90 BASE) MCG/ACT IN AERS: 2.0000 | INHALATION_SPRAY | Freq: Four times a day (QID) | RESPIRATORY_TRACT | Status: DC | PRN

## 2015-05-06 NOTE — Telephone Encounter (Signed)
Pt needs inhaler sent to

## 2015-05-07 LAB — BRAIN NATRIURETIC PEPTIDE: Brain Natriuretic Peptide: 209.7 pg/mL — ABNORMAL HIGH (ref 0.0–100.0)

## 2015-05-08 ENCOUNTER — Telehealth: Payer: Self-pay

## 2015-05-08 DIAGNOSIS — E876 Hypokalemia: Secondary | ICD-10-CM

## 2015-05-08 MED ORDER — POTASSIUM CHLORIDE CRYS ER 20 MEQ PO TBCR: 20.0000 meq | EXTENDED_RELEASE_TABLET | Freq: Every day | ORAL | Status: DC

## 2015-05-08 NOTE — Telephone Encounter (Signed)
Pt hasn't been on Potassium in a while. Can you send rx to [facility]. Thanks

## 2015-05-16 ENCOUNTER — Telehealth (HOSPITAL_COMMUNITY): Payer: Self-pay | Admitting: Vascular Surgery

## 2015-05-16 NOTE — Telephone Encounter (Signed)
Pt left message stating that he has increased SOB.Marland Kitchen PLEASE Advise

## 2015-05-16 NOTE — Telephone Encounter (Signed)
Patient called c/o weight gain 5-6 lbs over past few days with more SOB, unable to lay down.  Patient states he took metolazone once yesterday and once today and has had 4 lb reduction, however, still SOB and hard time laying down and breathing.  Patient due in 2 weeks for his 3 month appointment with the [facility], moved appointment up to cancellation slot this coming Monday.  Advised to bring all of his medications and weight charts with him.  Aware and agreeable.  Ave Filter

## 2015-05-19 ENCOUNTER — Ambulatory Visit (HOSPITAL_COMMUNITY)
Admission: RE | Admit: 2015-05-19 | Discharge: 2015-05-19 | Disposition: A | Discharge status: Home / Self Care | Payer: Commercial Managed Care - HMO | Source: Ambulatory Visit | Attending: Internal Medicine | Admitting: Internal Medicine

## 2015-05-19 VITALS — BP 126/78 | HR 84 | Wt 238.8 lb

## 2015-05-19 DIAGNOSIS — K219 Gastro-esophageal reflux disease without esophagitis: Secondary | ICD-10-CM | POA: Diagnosis not present

## 2015-05-19 DIAGNOSIS — I739 Peripheral vascular disease, unspecified: Secondary | ICD-10-CM | POA: Diagnosis not present

## 2015-05-19 DIAGNOSIS — J45909 Unspecified asthma, uncomplicated: Secondary | ICD-10-CM | POA: Diagnosis not present

## 2015-05-19 DIAGNOSIS — I129 Hypertensive chronic kidney disease with stage 1 through stage 4 chronic kidney disease, or unspecified chronic kidney disease: Secondary | ICD-10-CM | POA: Insufficient documentation

## 2015-05-19 DIAGNOSIS — Z79899 Other long term (current) drug therapy: Secondary | ICD-10-CM | POA: Diagnosis not present

## 2015-05-19 DIAGNOSIS — G8929 Other chronic pain: Secondary | ICD-10-CM | POA: Diagnosis not present

## 2015-05-19 DIAGNOSIS — J449 Chronic obstructive pulmonary disease, unspecified: Secondary | ICD-10-CM | POA: Insufficient documentation

## 2015-05-19 DIAGNOSIS — I5022 Chronic systolic (congestive) heart failure: Secondary | ICD-10-CM | POA: Diagnosis present

## 2015-05-19 DIAGNOSIS — E1122 Type 2 diabetes mellitus with diabetic chronic kidney disease: Secondary | ICD-10-CM | POA: Diagnosis not present

## 2015-05-19 DIAGNOSIS — I429 Cardiomyopathy, unspecified: Secondary | ICD-10-CM | POA: Insufficient documentation

## 2015-05-19 DIAGNOSIS — N183 Chronic kidney disease, stage 3 (moderate): Secondary | ICD-10-CM | POA: Diagnosis not present

## 2015-05-19 DIAGNOSIS — M1990 Unspecified osteoarthritis, unspecified site: Secondary | ICD-10-CM | POA: Diagnosis not present

## 2015-05-19 DIAGNOSIS — Z7902 Long term (current) use of antithrombotics/antiplatelets: Secondary | ICD-10-CM | POA: Insufficient documentation

## 2015-05-19 DIAGNOSIS — E785 Hyperlipidemia, unspecified: Secondary | ICD-10-CM | POA: Diagnosis not present

## 2015-05-19 DIAGNOSIS — I4891 Unspecified atrial fibrillation: Secondary | ICD-10-CM | POA: Insufficient documentation

## 2015-05-19 DIAGNOSIS — Z833 Family history of diabetes mellitus: Secondary | ICD-10-CM | POA: Diagnosis not present

## 2015-05-19 DIAGNOSIS — M109 Gout, unspecified: Secondary | ICD-10-CM | POA: Insufficient documentation

## 2015-05-19 DIAGNOSIS — Z8673 Personal history of transient ischemic attack (TIA), and cerebral infarction without residual deficits: Secondary | ICD-10-CM | POA: Diagnosis not present

## 2015-05-19 LAB — BASIC METABOLIC PANEL
Anion gap: 14 (ref 5–15)
BUN: 80 mg/dL — ABNORMAL HIGH (ref 6–20)
CO2: 29 mmol/L (ref 22–32)
Calcium: 9.7 mg/dL (ref 8.9–10.3)
Chloride: 94 mmol/L — ABNORMAL LOW (ref 101–111)
Creatinine, Ser: 3.06 mg/dL — ABNORMAL HIGH (ref 0.61–1.24)
GFR calc Af Amer: 23 mL/min — ABNORMAL LOW (ref >60)
GFR calc non Af Amer: 20 mL/min — ABNORMAL LOW (ref >60)
GLUCOSE: 115 mg/dL — AB (ref 65–99)
Potassium: 3.9 mmol/L (ref 3.5–5.1)
SODIUM: 137 mmol/L (ref 135–145)

## 2015-05-19 LAB — BRAIN NATRIURETIC PEPTIDE: B Natriuretic Peptide: 343.6 pg/mL — ABNORMAL HIGH (ref 0.0–100.0)

## 2015-05-19 NOTE — Progress Notes (Signed)
Patient ID: Frank Davidson, male   DOB: May 13, 1951, 64 y.o.   MRN: [id number]  PCP: Roxy Cedar FNP GI: Dr Reed Breech Nephrologist: Dr. Mercy Moore  HPI: Frank Davidson is a 64 yo male with a hx of obesity, PAF, HTN, COPD, HTN, gout, DM 2, CVA and CHF due to NICM. Intolerant Entresto at the lowest dose due to syncope.   Has previously followed in the [facility] in [place]. Previous cath in 2007 showed NICM with normal cors. Was living alone in [place] and apparently had issues managing medications by himself and was getting confused with PCP and cardiologists medications and was not taking his meds correctly. He moved to [place] to be near his nieces. Issues in the past with ACE-I due to renal failure. Highest creatinine in our system is ~1.5 but was apparently higher previously - thought to be related to taking his medications incorrectly.   Was admitted in 06/2013 from [facility] with left sided weakness and was found to have a moderate to large right hemispheric infarct. Went back to [facility].   Admitted to [facility] 10/14 through 09/09/13 with ADHF. Diuresed 30 pounds with IV lasix and transitioned to lasix 80 mg po bid. Continued on 25 mg carvedilol bid, isordil 20 mg daily.  Started on lisinopril 2.5 mg daily. Discharge weight 229 pounds. 09/29/13 CT abdomen and pelvis - no obstruction . Diverticulosis. No inflammatory changes.   Admitted 11/23/13 through 11/26/13 with volume overload and dyspnea. Had worsening cardiorenal syndrome. Nephrology consulted and managed with IV lasix and transitioned to torsemide 40 mg twice a day.  Diuresed 25 pounds. Discharge weight 232 pounds. Hydralazine, Spiro, and Lisinopril stopped at discharge. He is also off digoxin.    He returns for follow up. 2 weeks ago he went to Urgent Care for lab work and he had increased dyspnea. At that time spiro was increased. Overall feeling ok. Last week had to take metolazone for 3 days due to increased dyspnea and had  orthopnea. Weight was up to 244 and now down to 238 pounds. Dyspneic talking but denies dyspnea with exertion/orthopnea/pnd. No bleeding problems.  Tries to limit fluid intake to < 2 liters but eating lots of ice and ice pops. Eats ravioli every other day. Taking all medications.    ECHO 11/08/13 EF 15%  ECHO 07/05/14: EF 15%  ECHO 11/26/2014: EF 20%   08/13/14 RA = 7  RV = 50/3/10  PA = 54/22 (34)  PCW = 16  Fick cardiac output/index = 6.9/3.0  Thermo CO/CI = 4.7/2.1  PVR = 4.1 WU  Ao sat = 94%  PA sat = 68%,68%  SVC sat (sheath) = 62%   Labs 10/01/13 Dig level 2.5 --->dig stopped Labs 10/05/13 K 4.8 Creatinine 1.79 Labs 11/01/13 K 3.3 --Supplemented Creatinine 1.3 Uric Acid 9.3 AST 23 ALT 12 Hemoglobin 13 Hemoglobin 7.2  Labs 11/21/13 K 4.4 Creatinine 1.3            01/02/14 K 4.2, Creatinine 1.1            01/10/14 K 4.6, Creatinine 1.28           04/22/14 K 4.0 Creatinine 1.58         05/13/14 K 4.3, creatinine 1.46, pro-BNP 1377          06/24/14: K 3.9, creatinine 1.49         07/02/14 K 4.5, creatinine 1.82, pro-BNP 696.7         07/16/14 K 3.7, creatinine 2.41  08/01/14 K 4.3 creatinine 2.6 (demadex held for 2 days) - then restarted at 40 bid (baseline dose)         08/22/14 K 4.4 creatinine 1.6         09/02/14 Creatinine 1.6        10/20/14: creatinine 2.58, K 5.1         11/23/2014: Creatinine 4.12 --Ace and Spiro stopped          11/24/2014: Creatinine 3.55          1/4/12016: 2.49          11/26/2014: Hgb 10.4 Hct 31.7 Creatinine 1.94          12/03/14: K 4, creatinine 2.25         02/19/2015: K 3.8 Creatinine 2.66          05/05/2015: K 3.4 Creatinine 2.68   SH: Frank Davidson, [address] 2012 Lives with his niece  FH: Father DM  Mom had lung cancer  ROS: All systems negative except as listed in HPI, PMH and Problem List.  Past Medical History  Diagnosis Date  . Atrial fibrillation     PAF  . Hypertension   . COPD (chronic obstructive pulmonary disease)   . Ocular  herpes   . Gout   . CHF (congestive heart failure)   . Chronic pain   . Hyperlipidemia   . Nonischemic cardiomyopathy Aug 2015    EF 15%  . Ocular herpes   . Neuropathy   . Left-sided weakness     "because of the arthritis and edema"  . PVD (peripheral vascular disease) May 2015    abnormal ABIs  . Memory impairment     "short term"  . CKD (chronic kidney disease), stage III     Dr. Mercy Moore follows-holding   . Fibromyalgia     neuropathy- hands, more than feet.  . Sleep apnea with use of continuous positive airway pressure (CPAP)     does not use cpap  . GERD (gastroesophageal reflux disease)     tums as needed  . Asthma   . Type II diabetes mellitus   . Stroke X 5    last stroke aug 2014"; denies residual on 09/12/2014  . Osteoarthritis   . Arthritis     "knees, right elbow, shoulder" (09/12/2014)    Current Outpatient Prescriptions  Medication Sig Dispense Refill  . ACCU-CHEK FASTCLIX LANCETS MISC Test blood sugar once daily. Dx code: E11.8 100 each 3  . albuterol (PROVENTIL HFA;VENTOLIN HFA) 108 (90 BASE) MCG/ACT inhaler Inhale 2 puffs into the lungs every 6 (six) hours as needed for wheezing or shortness of breath. 1 Inhaler 1  . Alcohol Swabs (B-D SINGLE USE SWABS REGULAR) PADS Test blood sugar daily. Dx code: E11.8 100 each 3  . allopurinol (ZYLOPRIM) 100 MG tablet Take 1 tablet (100 mg total) by mouth daily with breakfast. 90 tablet 3  . apixaban (ELIQUIS) 5 MG TABS tablet Take 1 tablet (5 mg total) by mouth 2 (two) times daily. 180 tablet 3  . baclofen (LIORESAL) 10 MG tablet TAKE 1 TABLET EVERY 8 HOURS AS NEEDED FOR MUSCLE SPASM(S) 90 tablet 1  . Blood Glucose Calibration (ACCU-CHEK SMARTVIEW CONTROL) LIQD Test blood sugar once daily. Dx code: E11.8. 1 each 3  . Blood Glucose Monitoring Suppl (ACCU-CHEK NANO SMARTVIEW) W/DEVICE KIT Test blood sugar daily. Dx code: E11.8 1 kit 0  . carvedilol (COREG) 25 MG tablet TAKE 1/2 TABLET TWICE DAILY  WITH  A  MEAL 90 tablet  2  . colchicine 0.6 MG tablet Take 1.2 mg by mouth daily as needed (Gout). Colcrys    . docusate sodium 100 MG CAPS Take 100 mg by mouth 2 (two) times daily. 10 capsule 0  . glipiZIDE (GLUCOTROL) 5 MG tablet Take 1 tablet (5 mg total) by mouth 2 (two) times daily. 180 tablet 3  . glucose blood (ACCU-CHEK SMARTVIEW) test strip Test blood sugar once daily. Dx code: E11.8 100 each 3  . Hawthorne Berry 550 MG CAPS Take 1,100 mg by mouth 2 (two) times daily.    . hydrALAZINE (APRESOLINE) 50 MG tablet Take 1 tablet (50 mg total) by mouth 3 (three) times daily. 270 tablet 2  . isosorbide mononitrate (IMDUR) 60 MG 24 hr tablet TAKE 1 TABLET (60 MG TOTAL) BY MOUTH DAILY. 90 tablet 2  . meloxicam (MOBIC) 7.5 MG tablet Take 1 tablet (7.5 mg total) by mouth daily. 90 tablet 1  . metolazone (ZAROXOLYN) 5 MG tablet Take 1 tablet (5 mg total) by mouth daily as needed (Swelling). 30 tablet 2  . Misc Natural Products (TURMERIC CURCUMIN) CAPS Take 1 capsule by mouth 3 (three) times daily.    Marland Kitchen oxyCODONE-acetaminophen (PERCOCET) 10-325 MG per tablet Take 1 tablet by mouth 4 (four) times daily. scheduled    . polyethylene glycol powder (GLYCOLAX/MIRALAX) powder MIX 1 CAPFUL (17 GM) IN LIQUID AND DRINK EVERY DAY AS DIRECTED 1700 g 2  . potassium chloride SA (K-DUR,KLOR-CON) 20 MEQ tablet Take 1 tablet (20 mEq total) by mouth daily. 30 tablet 0  . pregabalin (LYRICA) 75 MG capsule Take 1 capsule (75 mg total) by mouth daily. 90 capsule 3  . selenium 50 MCG TABS tablet Take 200 mcg by mouth daily.    Marland Kitchen spironolactone (ALDACTONE) 25 MG tablet Take 1 tablet (25 mg total) by mouth daily. 90 tablet 3  . torsemide (DEMADEX) 20 MG tablet Take 2 tablets (40 mg total) by mouth 2 (two) times daily. 360 tablet 2  . vitamin B-12 (CYANOCOBALAMIN) 250 MCG tablet Take 250 mcg by mouth daily.    . diclofenac (FLECTOR) 1.3 % PTCH Place 1 patch onto the skin 2 (two) times daily as needed (pain).     No current facility-administered  medications for this encounter.    Filed Vitals:   05/19/15 1140  BP: 126/78  Pulse: 84  Weight: 238 lb 12.8 oz (108.319 kg)  SpO2: 93%   PHYSICAL EXAM: General:  Obese male; Ambulated into clinic with a cane, Dyspneic when talking.  HEENT: normal Neck: supple. JVP difficult to assess d/t body habitus - does not appear markedly elevated.  Carotids 2+ bilaterally; no bruits. No lymphadenopathy or thryomegaly appreciated. Cor: PMI nonpalpable. Regular rate & rhythm. No rubs, gallops or murmurs. Lungs: clear Abdomen: obese soft, nontender, ++ distended. No hepatosplenomegaly. No bruits or masses. Good bowel sounds. Extremities: no cyanosis, clubbing, rash, trace-1+bilateral edema. Compression stockings in place.   Neuro: alert & orientedx3, cranial nerves grossly intact. Affect pleasant.   ASSESSMENT & PLAN:  1) Chronic Systolic Heart Failure: nonischemic cardiomyopathy, EF 20% 1/16. NYHA II symptoms and volume status stable.  - Volume status elevated. Continue torsemide 40 mg twice a day and metolazone as needed.   Continue 25 mg spiro.  - Continue coreg 12.5 mg BID. Has not tolerated up titration in the past due to fatigue. - Continue Imdur + hdralazine to 50 mg tid.   - Off digoxin due to CKD.  -Check BMET and  BNP now.  - Syncope in past with Entresto.  Corlanor also stopped during recent admit.  - He remains firm in his decision to not pursue ICD.  - Reinforced the need and importance of daily weights, a low sodium diet and to avoid soups, and fluid restriction (less than 2 L a day). Instructed to call the HF clinic if weight increases more than 3 lbs overnight or 5 lbs in a week.  2) Gout: Continue allopurinol. Continue to follow with rheumatology.  3) Atrial fibrillation: Regular rhythm. Continue Eliquis. Recent CBC stable on 02/19/2015.  4) CKD stage III: Baseline 1.9-2.4. He has nephrology follow up.   Follow up 6 weeks  CLEGG,AMY NP-C  05/19/2015  Patient seen and  examined with Darrick Grinder, NP. We discussed all aspects of the encounter. I agree with the assessment and plan as stated above.   Volume status hard to assess. May be mildly elevated. Agree with another dose of metolazone. Watch renal function closely as it has been getting worse. Will check labs including BMET and BNP. Refuses ICD. Has had trouble tolerating med titration as well as starting Entresto and ivabradine. Remains on Eliquis for PAF.  Bensimhon, Daniel,MD 1:13 AM

## 2015-05-19 NOTE — Patient Instructions (Signed)
Take Metolazone today  Labs today  Your physician recommends that you schedule a follow-up appointment in: 6 weeks

## 2015-05-20 ENCOUNTER — Other Ambulatory Visit: Payer: Self-pay | Admitting: Family Medicine

## 2015-05-22 ENCOUNTER — Telehealth: Payer: Self-pay

## 2015-05-22 NOTE — Telephone Encounter (Signed)
Pt wants a new referral to pain management. He says he can no longer go to preferred pain management because they do not participate with [employer].

## 2015-05-22 NOTE — Telephone Encounter (Signed)
Spoke to pt advised to call his insurance and see which doctors they will cover. Pt understood and will call me back.

## 2015-05-22 NOTE — Telephone Encounter (Signed)
Would ask him to call [facility] and find out what his options are- I am not sure who accepts this plan

## 2015-05-22 NOTE — Telephone Encounter (Signed)
Anyone in particular in mind? Dr. Patsy Lager

## 2015-06-04 ENCOUNTER — Ambulatory Visit (HOSPITAL_COMMUNITY)
Admission: RE | Admit: 2015-06-04 | Discharge: 2015-06-04 | Disposition: A | Discharge status: Home / Self Care | Payer: Commercial Managed Care - HMO | Source: Ambulatory Visit | Attending: Cardiology | Admitting: Cardiology

## 2015-06-04 DIAGNOSIS — I5022 Chronic systolic (congestive) heart failure: Secondary | ICD-10-CM | POA: Insufficient documentation

## 2015-06-04 LAB — BASIC METABOLIC PANEL
ANION GAP: 12 (ref 5–15)
BUN: 47 mg/dL — ABNORMAL HIGH (ref 6–20)
CALCIUM: 9.7 mg/dL (ref 8.9–10.3)
CO2: 27 mmol/L (ref 22–32)
Chloride: 101 mmol/L (ref 101–111)
Creatinine, Ser: 2.19 mg/dL — ABNORMAL HIGH (ref 0.61–1.24)
GFR calc Af Amer: 35 mL/min — ABNORMAL LOW (ref >60)
GFR, EST NON AFRICAN AMERICAN: 30 mL/min — AB (ref >60)
GLUCOSE: 152 mg/dL — AB (ref 65–99)
Potassium: 4 mmol/L (ref 3.5–5.1)
SODIUM: 140 mmol/L (ref 135–145)

## 2015-06-09 ENCOUNTER — Encounter (HOSPITAL_COMMUNITY): Payer: Medicare HMO

## 2015-06-14 ENCOUNTER — Ambulatory Visit (INDEPENDENT_AMBULATORY_CARE_PROVIDER_SITE_OTHER): Payer: Commercial Managed Care - HMO | Admitting: Family Medicine

## 2015-06-14 VITALS — BP 144/86 | HR 98 | Temp 97.7°F | Resp 18 | Ht 68.5 in | Wt 240.4 lb

## 2015-06-14 DIAGNOSIS — F1910 Other psychoactive substance abuse, uncomplicated: Secondary | ICD-10-CM | POA: Diagnosis not present

## 2015-06-14 DIAGNOSIS — R51 Headache: Secondary | ICD-10-CM | POA: Diagnosis not present

## 2015-06-14 DIAGNOSIS — M791 Myalgia, unspecified site: Secondary | ICD-10-CM

## 2015-06-14 DIAGNOSIS — R519 Headache, unspecified: Secondary | ICD-10-CM

## 2015-06-14 LAB — COMPLETE METABOLIC PANEL WITH GFR
ALT: 19 U/L (ref 0–53)
AST: 32 U/L (ref 0–37)
Albumin: 4.5 g/dL (ref 3.5–5.2)
Alkaline Phosphatase: 78 U/L (ref 39–117)
BUN: 62 mg/dL — ABNORMAL HIGH (ref 6–23)
CO2: 30 mEq/L (ref 19–32)
Calcium: 9.9 mg/dL (ref 8.4–10.5)
Chloride: 92 mEq/L — ABNORMAL LOW (ref 96–112)
Creat: 3.06 mg/dL — ABNORMAL HIGH (ref 0.50–1.35)
GFR, Est African American: 24 mL/min — ABNORMAL LOW
GFR, Est Non African American: 20 mL/min — ABNORMAL LOW
Glucose, Bld: 86 mg/dL (ref 70–99)
Potassium: 4.1 mEq/L (ref 3.5–5.3)
Sodium: 137 mEq/L (ref 135–145)
Total Bilirubin: 0.5 mg/dL (ref 0.2–1.2)
Total Protein: 7.9 g/dL (ref 6.0–8.3)

## 2015-06-14 LAB — POCT CBC
Granulocyte percent: 67.3 %G (ref 37–80)
HCT, POC: 41 % — AB (ref 43.5–53.7)
Hemoglobin: 12.8 g/dL — AB (ref 14.1–18.1)
Lymph, poc: 1.5 (ref 0.6–3.4)
MCH, POC: 27.1 pg (ref 27–31.2)
MCHC: 31.3 g/dL — AB (ref 31.8–35.4)
MCV: 86.7 fL (ref 80–97)
MID (cbc): 0.8 (ref 0–0.9)
MPV: 7.3 fL (ref 0–99.8)
POC Granulocyte: 4.8 (ref 2–6.9)
POC LYMPH PERCENT: 21.8 %L (ref 10–50)
POC MID %: 10.9 %M (ref 0–12)
Platelet Count, POC: 211 10*3/uL (ref 142–424)
RBC: 4.73 M/uL (ref 4.69–6.13)
RDW, POC: 18.1 %
WBC: 7.1 10*3/uL (ref 4.6–10.2)

## 2015-06-14 LAB — POCT UA - MICROSCOPIC ONLY
Casts, Ur, LPF, POC: NEGATIVE
Crystals, Ur, HPF, POC: NEGATIVE
Mucus, UA: NEGATIVE
RBC, urine, microscopic: NEGATIVE
Yeast, UA: NEGATIVE

## 2015-06-14 LAB — POCT URINALYSIS DIPSTICK
Bilirubin, UA: NEGATIVE
Blood, UA: NEGATIVE
Glucose, UA: NEGATIVE
Ketones, UA: NEGATIVE
Leukocytes, UA: NEGATIVE
Nitrite, UA: NEGATIVE
Protein, UA: NEGATIVE
Spec Grav, UA: 1.01
Urobilinogen, UA: 0.2
pH, UA: 6.5

## 2015-06-14 LAB — POCT SEDIMENTATION RATE: POCT SED RATE: 56 mm/hr — AB (ref 0–22)

## 2015-06-14 LAB — GLUCOSE, POCT (MANUAL RESULT ENTRY): POC Glucose: 92 mg/dl (ref 70–99)

## 2015-06-14 NOTE — Patient Instructions (Addendum)
Try calling elastics therapy at [phone] for your compression stockings.  The lab tests, symptoms, and physical exam all point to a viral infection. I would recommend taking Tylenol expecting that the symptoms will resolve over the next 3-4 days. Please return midweek if symptoms have not resolved

## 2015-06-14 NOTE — Progress Notes (Signed)
Subjective:    Patient ID: Frank Davidson, male    DOB: June 19, 1951, 64 y.o.   MRN: [id number] This chart was scribed for [name], MD by Jolene Provost, Medical Scribe. This patient was seen in Room 14 and the patient's care was started a 1:52 PM.  Chief Complaint  Patient presents with   Shortness of Breath    x 4 days    Headache   Diarrhea    x 2 days     HPI HPI Comments: Frank Davidson is a 64 y.o. male who presents to [facility] complaining of HA, SOB, mild cough and diarrhea for the last three days with associated myalgias, wheezing, and intermittent low grade fever. Pt states his sx started with a HA, then congestion, SOB and cough. Pt also noticed he was retaining fluid a few days ago, and called his cardiologist who recommended he take extra Lozol. Pt endorses associated sinus congestion and drainage.    Review of Systems  Constitutional: Negative for fever and chills.  Respiratory: Positive for cough, shortness of breath and wheezing.   Cardiovascular: Positive for leg swelling. Negative for chest pain.  Gastrointestinal: Positive for diarrhea. Negative for vomiting.  Neurological: Positive for headaches. Negative for dizziness.       Objective:   Physical Exam  Constitutional: He is oriented to person, place, and time. He appears well-developed and well-nourished. No distress.  HENT:  Head: Normocephalic and atraumatic.  Eyes: Pupils are equal, round, and reactive to light.  Neck: Neck supple.  Cardiovascular: Normal rate.   Pulmonary/Chest: Effort normal. No respiratory distress.  Musculoskeletal: Normal range of motion.  Neurological: He is alert and oriented to person, place, and time. Coordination normal.  Skin: Skin is warm and dry. He is not diaphoretic.  Psychiatric: He has a normal mood and affect. His behavior is normal.  Nursing note and vitals reviewed.  Results for orders placed or performed in visit on 06/14/15  POCT UA - Microscopic  Only  Result Value Ref Range   WBC, Ur, HPF, POC 0-1    RBC, urine, microscopic negative    Bacteria, U Microscopic trace    Mucus, UA negative    Epithelial cells, urine per micros 0-3    Crystals, Ur, HPF, POC negative    Casts, Ur, LPF, POC negative    Yeast, UA negative   POCT urinalysis dipstick  Result Value Ref Range   Color, UA yellow    Clarity, UA clear    Glucose, UA negative    Bilirubin, UA negative    Ketones, UA negative    Spec Grav, UA 1.010    Blood, UA negative    pH, UA 6.5    Protein, UA negative    Urobilinogen, UA 0.2    Nitrite, UA negative    Leukocytes, UA Negative Negative  POCT CBC  Result Value Ref Range   WBC 7.1 4.6 - 10.2 K/uL   Lymph, poc 1.5 0.6 - 3.4   POC LYMPH PERCENT 21.8 10 - 50 %L   MID (cbc) 0.8 0 - 0.9   POC MID % 10.9 0 - 12 %M   POC Granulocyte 4.8 2 - 6.9   Granulocyte percent 67.3 37 - 80 %G   RBC 4.73 4.69 - 6.13 M/uL   Hemoglobin 12.8 (A) 14.1 - 18.1 g/dL   HCT, POC 40.9 (A) 81.1 - 53.7 %   MCV 86.7 80 - 97 fL   MCH, POC 27.1 27 -  31.2 pg   MCHC 31.3 (A) 31.8 - 35.4 g/dL   RDW, POC 82.8 %   Platelet Count, POC 211 142 - 424 K/uL   MPV 7.3 0 - 99.8 fL  POCT glucose (manual entry)  Result Value Ref Range   POC Glucose 92 70 - 99 mg/dl         Assessment & Plan:   This chart was scribed in my presence and reviewed by me personally.    ICD-9-CM ICD-10-CM   1. Headache above the eye region 784.0 R51 POCT UA - Microscopic Only     POCT urinalysis dipstick     POCT CBC     POCT SEDIMENTATION RATE     COMPLETE METABOLIC PANEL WITH GFR     POCT glucose (manual entry)  2. Myalgia 729.1 M79.1 POCT UA - Microscopic Only     POCT urinalysis dipstick     POCT CBC     POCT SEDIMENTATION RATE     COMPLETE METABOLIC PANEL WITH GFR     POCT glucose (manual entry)  3. Polysubstance abuse 305.90 F19.10      Signed, [name], MD

## 2015-06-16 ENCOUNTER — Telehealth: Payer: Self-pay

## 2015-06-16 NOTE — Telephone Encounter (Signed)
Pt was returning Dr Cyndie Chime call. Thank you

## 2015-06-17 NOTE — Telephone Encounter (Signed)
Dr. Patsy Lager,  Did you call patient? I'm not seeing any recent telephone calls. I may have overlooked one too.   Thanks, [employer]

## 2015-06-17 NOTE — Telephone Encounter (Signed)
Called him back and LMOM- I did not call him.  If he does need anything please let me know!

## 2015-06-18 ENCOUNTER — Encounter (HOSPITAL_COMMUNITY): Payer: Self-pay | Admitting: Family Medicine

## 2015-06-18 ENCOUNTER — Ambulatory Visit (INDEPENDENT_AMBULATORY_CARE_PROVIDER_SITE_OTHER): Payer: Commercial Managed Care - HMO

## 2015-06-18 ENCOUNTER — Ambulatory Visit (INDEPENDENT_AMBULATORY_CARE_PROVIDER_SITE_OTHER): Payer: Commercial Managed Care - HMO | Admitting: Emergency Medicine

## 2015-06-18 ENCOUNTER — Inpatient Hospital Stay (HOSPITAL_COMMUNITY)
Admission: EM | Admit: 2015-06-18 | Discharge: 2015-06-24 | DRG: 292 | Disposition: A | Discharge status: Home / Self Care | Payer: Commercial Managed Care - HMO | Attending: Internal Medicine | Admitting: Internal Medicine

## 2015-06-18 VITALS — BP 128/80 | HR 85 | Temp 98.1°F | Resp 20 | Ht 68.0 in | Wt 243.0 lb

## 2015-06-18 DIAGNOSIS — I5023 Acute on chronic systolic (congestive) heart failure: Principal | ICD-10-CM | POA: Diagnosis present

## 2015-06-18 DIAGNOSIS — N183 Chronic kidney disease, stage 3 unspecified: Secondary | ICD-10-CM | POA: Diagnosis present

## 2015-06-18 DIAGNOSIS — I10 Essential (primary) hypertension: Secondary | ICD-10-CM | POA: Diagnosis not present

## 2015-06-18 DIAGNOSIS — E119 Type 2 diabetes mellitus without complications: Secondary | ICD-10-CM | POA: Diagnosis not present

## 2015-06-18 DIAGNOSIS — J449 Chronic obstructive pulmonary disease, unspecified: Secondary | ICD-10-CM | POA: Diagnosis present

## 2015-06-18 DIAGNOSIS — R0602 Shortness of breath: Secondary | ICD-10-CM

## 2015-06-18 DIAGNOSIS — R351 Nocturia: Secondary | ICD-10-CM

## 2015-06-18 DIAGNOSIS — Z87891 Personal history of nicotine dependence: Secondary | ICD-10-CM

## 2015-06-18 DIAGNOSIS — Z8249 Family history of ischemic heart disease and other diseases of the circulatory system: Secondary | ICD-10-CM

## 2015-06-18 DIAGNOSIS — E118 Type 2 diabetes mellitus with unspecified complications: Secondary | ICD-10-CM

## 2015-06-18 DIAGNOSIS — B029 Zoster without complications: Secondary | ICD-10-CM | POA: Diagnosis not present

## 2015-06-18 DIAGNOSIS — I509 Heart failure, unspecified: Secondary | ICD-10-CM | POA: Diagnosis not present

## 2015-06-18 DIAGNOSIS — Z833 Family history of diabetes mellitus: Secondary | ICD-10-CM | POA: Diagnosis not present

## 2015-06-18 DIAGNOSIS — N189 Chronic kidney disease, unspecified: Secondary | ICD-10-CM | POA: Diagnosis not present

## 2015-06-18 DIAGNOSIS — Z96642 Presence of left artificial hip joint: Secondary | ICD-10-CM | POA: Diagnosis present

## 2015-06-18 DIAGNOSIS — I480 Paroxysmal atrial fibrillation: Secondary | ICD-10-CM | POA: Diagnosis present

## 2015-06-18 DIAGNOSIS — M1990 Unspecified osteoarthritis, unspecified site: Secondary | ICD-10-CM | POA: Diagnosis present

## 2015-06-18 DIAGNOSIS — I429 Cardiomyopathy, unspecified: Secondary | ICD-10-CM | POA: Diagnosis present

## 2015-06-18 DIAGNOSIS — K219 Gastro-esophageal reflux disease without esophagitis: Secondary | ICD-10-CM | POA: Diagnosis present

## 2015-06-18 DIAGNOSIS — M797 Fibromyalgia: Secondary | ICD-10-CM | POA: Diagnosis present

## 2015-06-18 DIAGNOSIS — R21 Rash and other nonspecific skin eruption: Secondary | ICD-10-CM | POA: Diagnosis not present

## 2015-06-18 DIAGNOSIS — E1122 Type 2 diabetes mellitus with diabetic chronic kidney disease: Secondary | ICD-10-CM | POA: Diagnosis present

## 2015-06-18 DIAGNOSIS — D649 Anemia, unspecified: Secondary | ICD-10-CM | POA: Diagnosis present

## 2015-06-18 DIAGNOSIS — J45909 Unspecified asthma, uncomplicated: Secondary | ICD-10-CM | POA: Diagnosis present

## 2015-06-18 DIAGNOSIS — I129 Hypertensive chronic kidney disease with stage 1 through stage 4 chronic kidney disease, or unspecified chronic kidney disease: Secondary | ICD-10-CM | POA: Diagnosis present

## 2015-06-18 DIAGNOSIS — E785 Hyperlipidemia, unspecified: Secondary | ICD-10-CM | POA: Diagnosis present

## 2015-06-18 DIAGNOSIS — B009 Herpesviral infection, unspecified: Secondary | ICD-10-CM | POA: Diagnosis present

## 2015-06-18 DIAGNOSIS — Z8673 Personal history of transient ischemic attack (TIA), and cerebral infarction without residual deficits: Secondary | ICD-10-CM | POA: Diagnosis not present

## 2015-06-18 DIAGNOSIS — B0230 Zoster ocular disease, unspecified: Secondary | ICD-10-CM | POA: Insufficient documentation

## 2015-06-18 LAB — CBC
HEMATOCRIT: 36.3 % — AB (ref 39.0–52.0)
HEMOGLOBIN: 11.6 g/dL — AB (ref 13.0–17.0)
MCH: 27.7 pg (ref 26.0–34.0)
MCHC: 32 g/dL (ref 30.0–36.0)
MCV: 86.6 fL (ref 78.0–100.0)
Platelets: 193 10*3/uL (ref 150–400)
RBC: 4.19 MIL/uL — AB (ref 4.22–5.81)
RDW: 16.1 % — ABNORMAL HIGH (ref 11.5–15.5)
WBC: 6.3 10*3/uL (ref 4.0–10.5)

## 2015-06-18 LAB — BRAIN NATRIURETIC PEPTIDE: B NATRIURETIC PEPTIDE 5: 823 pg/mL — AB (ref 0.0–100.0)

## 2015-06-18 LAB — I-STAT TROPONIN, ED: TROPONIN I, POC: 0.05 ng/mL (ref 0.00–0.08)

## 2015-06-18 LAB — BASIC METABOLIC PANEL
ANION GAP: 16 — AB (ref 5–15)
BUN: 72 mg/dL — ABNORMAL HIGH (ref 6–20)
CALCIUM: 9.2 mg/dL (ref 8.9–10.3)
CO2: 24 mmol/L (ref 22–32)
CREATININE: 2.65 mg/dL — AB (ref 0.61–1.24)
Chloride: 95 mmol/L — ABNORMAL LOW (ref 101–111)
GFR calc Af Amer: 27 mL/min — ABNORMAL LOW (ref >60)
GFR calc non Af Amer: 24 mL/min — ABNORMAL LOW (ref >60)
Glucose, Bld: 92 mg/dL (ref 65–99)
Potassium: 3.6 mmol/L (ref 3.5–5.1)
SODIUM: 135 mmol/L (ref 135–145)

## 2015-06-18 MED ORDER — TETRACAINE HCL 0.5 % OP SOLN
1.0000 [drp] | Freq: Once | OPHTHALMIC | Status: AC
  Administered 2015-06-18: 2 [drp] via OPHTHALMIC
  Filled 2015-06-18: qty 2

## 2015-06-18 MED ORDER — FLUORESCEIN SODIUM 1 MG OP STRP
1.0000 | ORAL_STRIP | Freq: Once | OPHTHALMIC | Status: AC
  Administered 2015-06-18: 1 via OPHTHALMIC
  Filled 2015-06-18: qty 1

## 2015-06-18 MED ORDER — FUROSEMIDE 10 MG/ML IJ SOLN
40.0000 mg | INTRAMUSCULAR | Status: AC
  Administered 2015-06-18: 40 mg via INTRAVENOUS
  Filled 2015-06-18: qty 4

## 2015-06-18 NOTE — Progress Notes (Signed)
Subjective:  Patient ID: Frank Davidson, male    DOB: May 27, 1951  Age: 64 y.o. MRN: [id number]  CC: Shortness of Breath; Headache; and Chills   HPI Frank Davidson presents   With shortness of breath. He was seen on 06/14/2015 with shortness of breath. He was discharged then with the recommendation any increases his diuretic. He's had increasing shortness of breath it's persisted with nocturia. He has peripheral edema. He keeps in check with support hose. He denies any chest pain tightness heaviness pressure. Has some dyspnea on exertion and paroxysmal nocturnal dyspnea. And orthopnea.  History Frank Davidson has a past medical history of Atrial fibrillation; Hypertension; COPD (chronic obstructive pulmonary disease); Ocular herpes; Gout; CHF (congestive heart failure); Chronic pain; Hyperlipidemia; Nonischemic cardiomyopathy (Aug 2015); Ocular herpes; Neuropathy; Left-sided weakness; PVD (peripheral vascular disease) (May 2015); Memory impairment; CKD (chronic kidney disease), stage III; Fibromyalgia; Sleep apnea with use of continuous positive airway pressure (CPAP); GERD (gastroesophageal reflux disease); Asthma; Type II diabetes mellitus; Stroke (X 5); Osteoarthritis; and Arthritis.   He has past surgical history that includes Orchiectomy (Left); Tonsillectomy (age 27); Esophagogastroduodenoscopy (N/A, 12/24/2013); Colonoscopy (N/A, 12/24/2013); Joint replacement; Cardiac catheterization (10/2006); Cataract extraction w/ intraocular lens  implant, bilateral (Bilateral, ~ 2008); Total hip arthroplasty (Left, 09/12/2014); and right heart catheterization (N/A, 08/13/2014).   His  family history includes Cancer in his mother; Diabetes in his father and sister; Hypertension in his brother, mother, sister, and sister; Lung disease in his mother. There is no history of Colon cancer.  He   reports that he quit smoking about 8 years ago. His smoking use included Cigarettes. He has a 5 pack-year smoking  history. He has never used smokeless tobacco. He reports that he uses illicit drugs (Cocaine and Marijuana). He reports that he does not drink alcohol.  Outpatient Prescriptions Prior to Visit  Medication Sig Dispense Refill  . ACCU-CHEK FASTCLIX LANCETS MISC Test blood sugar once daily. Dx code: E11.8 100 each 3  . albuterol (PROVENTIL HFA;VENTOLIN HFA) 108 (90 BASE) MCG/ACT inhaler Inhale 2 puffs into the lungs every 6 (six) hours as needed for wheezing or shortness of breath. 1 Inhaler 1  . Alcohol Swabs (B-D SINGLE USE SWABS REGULAR) PADS Test blood sugar daily. Dx code: E11.8 100 each 3  . allopurinol (ZYLOPRIM) 100 MG tablet Take 1 tablet (100 mg total) by mouth daily with breakfast. 90 tablet 3  . apixaban (ELIQUIS) 5 MG TABS tablet Take 1 tablet (5 mg total) by mouth 2 (two) times daily. 180 tablet 3  . baclofen (LIORESAL) 10 MG tablet TAKE 1 TABLET EVERY 8 HOURS AS NEEDED FOR MUSCLE SPASM(S) 90 tablet 1  . Blood Glucose Calibration (ACCU-CHEK SMARTVIEW CONTROL) LIQD Test blood sugar once daily. Dx code: E11.8. 1 each 3  . Blood Glucose Monitoring Suppl (ACCU-CHEK NANO SMARTVIEW) W/DEVICE KIT Test blood sugar daily. Dx code: E11.8 1 kit 0  . carvedilol (COREG) 25 MG tablet TAKE 1/2 TABLET TWICE DAILY  WITH  A  MEAL 90 tablet 2  . colchicine 0.6 MG tablet Take 1.2 mg by mouth daily as needed (Gout). Colcrys    . diclofenac (FLECTOR) 1.3 % PTCH Place 1 patch onto the skin 2 (two) times daily as needed (pain).    Marland Kitchen docusate sodium 100 MG CAPS Take 100 mg by mouth 2 (two) times daily. 10 capsule 0  . glipiZIDE (GLUCOTROL) 5 MG tablet Take 1 tablet (5 mg total) by mouth 2 (two) times daily. 180 tablet 3  .  glucose blood (ACCU-CHEK SMARTVIEW) test strip Test blood sugar once daily. Dx code: E11.8 100 each 3  . [place] Berry 550 MG CAPS Take 1,100 mg by mouth 2 (two) times daily.    . hydrALAZINE (APRESOLINE) 50 MG tablet Take 1 tablet (50 mg total) by mouth 3 (three) times daily. 270 tablet 2    . isosorbide mononitrate (IMDUR) 60 MG 24 hr tablet TAKE 1 TABLET (60 MG TOTAL) BY MOUTH DAILY. 90 tablet 2  . meloxicam (MOBIC) 7.5 MG tablet Take 1 tablet (7.5 mg total) by mouth daily. 90 tablet 1  . metolazone (ZAROXOLYN) 5 MG tablet Take 1 tablet (5 mg total) by mouth daily as needed (Swelling). 30 tablet 2  . Misc Natural Products (TURMERIC CURCUMIN) CAPS Take 1 capsule by mouth 3 (three) times daily.    Marland Kitchen oxyCODONE-acetaminophen (PERCOCET) 10-325 MG per tablet Take 1 tablet by mouth 4 (four) times daily. scheduled    . polyethylene glycol powder (GLYCOLAX/MIRALAX) powder MIX 1 CAPFUL (17 GM) IN LIQUID AND DRINK EVERY DAY AS DIRECTED 1700 g 2  . potassium chloride SA (K-DUR,KLOR-CON) 20 MEQ tablet Take 1 tablet (20 mEq total) by mouth daily. 30 tablet 0  . pregabalin (LYRICA) 75 MG capsule Take 1 capsule (75 mg total) by mouth daily. 90 capsule 3  . selenium 50 MCG TABS tablet Take 200 mcg by mouth daily.    Marland Kitchen spironolactone (ALDACTONE) 25 MG tablet Take 1 tablet (25 mg total) by mouth daily. 90 tablet 3  . torsemide (DEMADEX) 20 MG tablet Take 2 tablets (40 mg total) by mouth 2 (two) times daily. 360 tablet 2  . vitamin B-12 (CYANOCOBALAMIN) 250 MCG tablet Take 250 mcg by mouth daily.     No facility-administered medications prior to visit.    History   Social History  . Marital Status: Divorced    Spouse Name: N/A  . Number of Children: N/A  . Years of Education: N/A   Social History Main Topics  . Smoking status: Former Smoker -- 0.50 packs/day for 10 years    Types: Cigarettes    Quit date: 12/18/2006  . Smokeless tobacco: Never Used  . Alcohol Use: No     Comment: Quit 2008  . Drug Use: Yes    Special: Cocaine, Marijuana     Comment: last used marijuana and cocaine 2008  . Sexual Activity: Not Currently   Other Topics Concern  . None   Social History Narrative     Review of Systems  Constitutional: Negative for fever, chills and appetite change.  HENT:  Negative for congestion, ear pain, postnasal drip, sinus pressure and sore throat.   Eyes: Negative for pain and redness.  Respiratory: Positive for shortness of breath. Negative for cough, chest tightness and wheezing.   Cardiovascular: Positive for palpitations and leg swelling. Negative for chest pain.  Gastrointestinal: Negative for nausea, vomiting, abdominal pain, diarrhea, constipation and blood in stool.  Endocrine: Negative for polyuria.  Genitourinary: Negative for dysuria, urgency, frequency and flank pain.  Musculoskeletal: Negative for gait problem.  Skin: Negative for rash.  Neurological: Negative for weakness and headaches.  Psychiatric/Behavioral: Negative for confusion and decreased concentration. The patient is not nervous/anxious.     Objective:  BP 128/80 mmHg  Pulse 85  Temp(Src) 98.1 F (36.7 C) (Oral)  Resp 20  Ht _0  (1.727 m)  Wt 243 lb (110.224 kg)  BMI 36.96 kg/m2  SpO2 97%  Physical Exam  Constitutional: He is oriented to person,  place, and time. He appears well-developed and well-nourished. No distress.  HENT:  Head: Normocephalic and atraumatic.  Right Ear: External ear normal.  Left Ear: External ear normal.  Nose: Nose normal.  Eyes: Conjunctivae and EOM are normal. Pupils are equal, round, and reactive to light. No scleral icterus.  Neck: Normal range of motion. Neck supple. No tracheal deviation present.  Cardiovascular: Normal rate, regular rhythm and normal heart sounds.   Pulmonary/Chest: Effort normal. No respiratory distress. He has no wheezes. He has rales.  Abdominal: He exhibits no mass. There is no tenderness. There is no rebound and no guarding.  Musculoskeletal: He exhibits edema.  Lymphadenopathy:    He has no cervical adenopathy.  Neurological: He is alert and oriented to person, place, and time. Coordination normal.  Skin: Skin is warm and dry. No rash noted.  Psychiatric: He has a normal mood and affect. His behavior is  normal.      Assessment & Plan:   Frank Davidson was seen today for shortness of breath, headache and chills.  Diagnoses and all orders for this visit:  SOB (shortness of breath) Orders: -     EKG 12-Lead -     POCT CBC -     Comprehensive metabolic panel -     Brain natriuretic peptide -     DG Chest 2 View; Future  Congestive heart failure, unspecified congestive heart failure chronicity, unspecified congestive heart failure type Orders: -     EKG 12-Lead -     POCT CBC -     Comprehensive metabolic panel -     Brain natriuretic peptide -     DG Chest 2 View; Future  Nocturia Orders: -     EKG 12-Lead -     POCT CBC -     Comprehensive metabolic panel -     Brain natriuretic peptide -     DG Chest 2 View; Future  Diabetes mellitus type 2, controlled, with complications Orders: -     EKG 12-Lead -     POCT CBC -     Comprehensive metabolic panel -     Brain natriuretic peptide -     DG Chest 2 View; Future  Chronic renal insufficiency, unspecified stage Orders: -     EKG 12-Lead -     POCT CBC -     Comprehensive metabolic panel -     Brain natriuretic peptide -     DG Chest 2 View; Future   I encouraged him to the hospital by ambulance for evaluation of his worsening failure and he declined transportation to the hospital by ambulance and insisted that his  Niece would be taking the  Hospital by [place]. I reinforced the risks are increased to the patient and he declined transportation said that he defined by POV   I am having Mr. Dempster maintain his colchicine, DSS, Cathrine Muster, Turmeric Curcumin, oxyCODONE-acetaminophen, diclofenac, vitamin B-12, selenium, allopurinol, glipiZIDE, ACCU-CHEK SMARTVIEW CONTROL, glucose blood, ACCU-CHEK FASTCLIX LANCETS, B-D SINGLE USE SWABS REGULAR, ACCU-CHEK NANO SMARTVIEW, meloxicam, hydrALAZINE, isosorbide mononitrate, metolazone, torsemide, apixaban, pregabalin, spironolactone, baclofen, polyethylene glycol powder, carvedilol,  albuterol, and potassium chloride SA.  No orders of the defined types were placed in this encounter.    Appropriate red flag conditions were discussed with the patient as well as actions that should be taken.  Patient expressed his understanding.  Follow-up: Return if symptoms worsen or fail to improve.  Roselee Culver, MD   [facility] reading (PRIMARY)  by  Dr. Ouida Sills.  Cardiomegaly  No overt CHF .

## 2015-06-18 NOTE — Addendum Note (Signed)
Addended by: Carmelina Dane on: 06/18/2015 06:10 PM   Modules accepted: Orders

## 2015-06-18 NOTE — ED Notes (Signed)
Pt here for SOB. Sent here from [facility] and was told CHF. Pt xray results and EKG in chart.

## 2015-06-18 NOTE — ED Notes (Addendum)
Pt c/o headache and increased shortness of breath x 1 week. Reports having flu-like symptoms two weeks ago and felt symptoms may be related to that. Hx of CHF and reports 2 lbs or weight gain that are relieved with additional diuretics. Pt reports edema to L leg. Headache has been persistent; took percocet with some relieve. Closed blisters noted to top of R side of head, R side of face, and redness to R eye. Hx of chicken pox as a child. Reports placing a patch on his head for the pain, then rash appeared.

## 2015-06-18 NOTE — ED Provider Notes (Signed)
CSN: [id number]     Arrival date & time 06/18/15  1819 History   First MD Initiated Contact with Patient 06/18/15 2034     Chief Complaint  Patient presents with  . Shortness of Breath     (Consider location/radiation/quality/duration/timing/severity/associated sxs/prior Treatment) HPI Comments: Patient is a 64 yo M PMHx significant for CHF, A fib, COPD, ocular herpes, chronic pain, HLD, cardiomyopathy, CKD, DM, CVA presenting to the ED for evaluation of worsening shortness of breath over the last week. He has become increasingly more disc with exertion and had increased lower extremity edema. Patient also endorses that he has been 4-5 pounds over his normal weight. Patient is also complaining of a frontal headache. He states he tried Percocet at home with no improvement. He states he tried his Effexor patch on his face with little to no improvement. Patient now has a rash to that area. Denies any vision changes, eye pain.   Past Medical History  Diagnosis Date  . Atrial fibrillation     PAF  . Hypertension   . COPD (chronic obstructive pulmonary disease)   . Ocular herpes   . Gout   . CHF (congestive heart failure)   . Chronic pain   . Hyperlipidemia   . Nonischemic cardiomyopathy Aug 2015    EF 15%  . Ocular herpes   . Neuropathy   . Left-sided weakness     "because of the arthritis and edema"  . PVD (peripheral vascular disease) May 2015    abnormal ABIs  . Memory impairment     "short term"  . CKD (chronic kidney disease), stage III     Dr. Mercy Moore follows-holding   . Fibromyalgia     neuropathy- hands, more than feet.  . Sleep apnea with use of continuous positive airway pressure (CPAP)     does not use cpap  . GERD (gastroesophageal reflux disease)     tums as needed  . Asthma   . Type II diabetes mellitus   . Stroke X 5    last stroke aug 2014"; denies residual on 09/12/2014  . Osteoarthritis   . Arthritis     "knees, right elbow, shoulder" (09/12/2014)    Past Surgical History  Procedure Laterality Date  . 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 Tonsillectomy  age 31  . Esophagogastroduodenoscopy N/A 12/24/2013    Procedure: ESOPHAGOGASTRODUODENOSCOPY (EGD);  Surgeon: Inda Castle, MD;  Location: [facility];  Service: Endoscopy;  Laterality: N/A;  . Colonoscopy N/A 12/24/2013    Procedure: COLONOSCOPY;  Surgeon: Inda Castle, MD;  Location: [facility];  Service: Endoscopy;  Laterality: N/A;  . Joint replacement    . Cardiac catheterization  10/2006    normal coronary arteries;   Marland Kitchen Cataract extraction w/ intraocular lens  implant, bilateral Bilateral ~ 2008  . Total hip arthroplasty Left 09/12/2014    Procedure: LEFT TOTAL HIP ARTHROPLASTY ANTERIOR APPROACH;  Surgeon: Mauri Pole, MD;  Location: [facility];  Service: Orthopedics;  Laterality: Left;  . Right heart catheterization N/A 08/13/2014    Rt ht cath prior to hip surgery   Family History  Problem Relation Age of Onset  . Cancer Mother   . Hypertension Mother   . Lung disease Mother   . Diabetes Father   . Diabetes Sister   . Hypertension Sister   . Hypertension Brother   . Hypertension Sister   . Colon cancer Neg Hx    History  Substance  Use Topics  . Smoking status: Former Smoker -- 0.50 packs/day for 10 years    Types: Cigarettes    Quit date: 12/18/2006  . Smokeless tobacco: Never Used  . Alcohol Use: No     Comment: Quit 2008    Review of Systems  Respiratory: Positive for cough and shortness of breath.   Cardiovascular: Positive for leg swelling. Negative for chest pain.  Skin: Positive for rash.  Neurological: Positive for headaches.  All other systems reviewed and are negative.     Allergies  Corlanor; Januvia; Lunesta; and Actos  Home Medications   Prior to Admission medications   Medication Sig Start Date End Date Taking? Authorizing Provider  acetaminophen (TYLENOL) 500 MG tablet Take 500 mg by mouth every 6 (six) hours as needed for moderate  pain or headache.   Yes Historical Provider, MD  albuterol (PROVENTIL HFA;VENTOLIN HFA) 108 (90 BASE) MCG/ACT inhaler Inhale 2 puffs into the lungs every 6 (six) hours as needed for wheezing or shortness of breath. 05/06/15  Yes Gay Filler Copland, MD  allopurinol (ZYLOPRIM) 100 MG tablet Take 1 tablet (100 mg total) by mouth daily with breakfast. Patient taking differently: Take 100 mg by mouth 2 (two) times daily.  12/20/14  Yes Gay Filler Copland, MD  apixaban (ELIQUIS) 5 MG TABS tablet Take 1 tablet (5 mg total) by mouth 2 (two) times daily. 02/04/15  Yes Shaune Pascal Bensimhon, MD  baclofen (LIORESAL) 10 MG tablet TAKE 1 TABLET EVERY 8 HOURS AS NEEDED FOR MUSCLE SPASM(S) 03/28/15  Yes Gay Filler Copland, MD  carvedilol (COREG) 25 MG tablet TAKE 1/2 TABLET TWICE DAILY  WITH  A  MEAL 04/17/15  Yes Jolaine Artist, MD  diclofenac (FLECTOR) 1.3 % PTCH Place 1 patch onto the skin 2 (two) times daily as needed (pain).   Yes Historical Provider, MD  docusate sodium 100 MG CAPS Take 100 mg by mouth 2 (two) times daily. 09/16/14  Yes Matthew Babish, PA-C  glipiZIDE (GLUCOTROL) 5 MG tablet Take 1 tablet (5 mg total) by mouth 2 (two) times daily. 12/20/14  Yes Gay Filler Copland, MD  [place] Berry 550 MG CAPS Take 1,100 mg by mouth 2 (two) times daily.   Yes Historical Provider, MD  hydrALAZINE (APRESOLINE) 50 MG tablet Take 1 tablet (50 mg total) by mouth 3 (three) times daily. 01/03/15  Yes Jolaine Artist, MD  isosorbide mononitrate (IMDUR) 60 MG 24 hr tablet TAKE 1 TABLET (60 MG TOTAL) BY MOUTH DAILY. 01/03/15  Yes Jolaine Artist, MD  meloxicam (MOBIC) 7.5 MG tablet Take 1 tablet (7.5 mg total) by mouth daily. 12/23/14  Yes Robyn Haber, MD  metolazone (ZAROXOLYN) 5 MG tablet Take 1 tablet (5 mg total) by mouth daily as needed (Swelling). 01/03/15  Yes Jolaine Artist, MD  Misc Natural Products (TURMERIC CURCUMIN) CAPS Take 1 capsule by mouth 3 (three) times daily.   Yes Historical Provider, MD    oxyCODONE-acetaminophen (PERCOCET) 10-325 MG per tablet Take 1 tablet by mouth 4 (four) times daily. scheduled 11/16/14  Yes Historical Provider, MD  polyethylene glycol powder (GLYCOLAX/MIRALAX) powder MIX 1 CAPFUL (17 GM) IN LIQUID AND DRINK EVERY DAY AS DIRECTED 03/28/15  Yes Gay Filler Copland, MD  pregabalin (LYRICA) 75 MG capsule Take 1 capsule (75 mg total) by mouth daily. Patient taking differently: Take 75 mg by mouth 2 (two) times daily.  02/24/15  Yes Gay Filler Copland, MD  selenium 50 MCG TABS tablet Take 200 mcg by mouth  daily.   Yes Historical Provider, MD  spironolactone (ALDACTONE) 25 MG tablet Take 1 tablet (25 mg total) by mouth daily. 03/24/15  Yes Jolaine Artist, MD  torsemide (DEMADEX) 20 MG tablet Take 2 tablets (40 mg total) by mouth 2 (two) times daily. 01/03/15  Yes Jolaine Artist, MD  vitamin B-12 (CYANOCOBALAMIN) 250 MCG tablet Take 250 mcg by mouth daily.   Yes Historical Provider, MD  ACCU-CHEK FASTCLIX LANCETS MISC Test blood sugar once daily. Dx code: E11.8 12/20/14   Darreld Mclean, MD  Alcohol Swabs (B-D SINGLE USE SWABS REGULAR) PADS Test blood sugar daily. Dx code: E11.8 12/20/14   Darreld Mclean, MD  Blood Glucose Calibration (ACCU-CHEK SMARTVIEW CONTROL) LIQD Test blood sugar once daily. Dx code: E11.8. 12/20/14   Darreld Mclean, MD  Blood Glucose Monitoring Suppl (ACCU-CHEK NANO SMARTVIEW) W/DEVICE KIT Test blood sugar daily. Dx code: E11.8 12/20/14   Darreld Mclean, MD  colchicine 0.6 MG tablet Take 1.2 mg by mouth daily as needed (Gout). Colcrys    Historical Provider, MD  glucose blood (ACCU-CHEK SMARTVIEW) test strip Test blood sugar once daily. Dx code: E11.8 12/20/14   Gay Filler Copland, MD  potassium chloride SA (K-DUR,KLOR-CON) 20 MEQ tablet Take 1 tablet (20 mEq total) by mouth daily. Patient taking differently: Take 20 mEq by mouth daily as needed (swelling).  05/08/15   Robyn Haber, MD   BP 114/83 mmHg  Pulse 77  Temp(Src) 97.9 F (36.6  C) (Oral)  Resp 20  Ht '5\' 9"'  (1.753 m)  Wt 235 lb 14.4 oz (107.004 kg)  BMI 34.82 kg/m2  SpO2 98% Physical Exam  Constitutional: He is oriented to person, place, and time. He appears well-developed and well-nourished.  HENT:  Head: Normocephalic and atraumatic.    Right Ear: External ear normal.  Left Ear: External ear normal.  Nose: Nose normal.  Eyes: EOM and lids are normal. Pupils are equal, round, and reactive to light. Right eye exhibits no discharge. Left eye exhibits no discharge. Right conjunctiva is injected.  Slit lamp exam:      The right eye shows no corneal abrasion, no corneal flare, no corneal ulcer and no fluorescein uptake.  Neck: Neck supple.  Cardiovascular: Normal rate, regular rhythm and normal heart sounds.   Pulmonary/Chest: Effort normal and breath sounds normal.  Abdominal: Soft. There is no tenderness.  Musculoskeletal: He exhibits edema (2+).  Neurological: He is alert and oriented to person, place, and time.  Skin: Skin is warm and dry.  Nursing note and vitals reviewed.   ED Course  Procedures (including critical care time) Labs Review Labs Reviewed  BASIC METABOLIC PANEL - Abnormal; Notable for the following:    Chloride 95 (*)    BUN 72 (*)    Creatinine, Ser 2.65 (*)    GFR calc non Af Amer 24 (*)    GFR calc Af Amer 27 (*)    Anion gap 16 (*)    All other components within normal limits  CBC - Abnormal; Notable for the following:    RBC 4.19 (*)    Hemoglobin 11.6 (*)    HCT 36.3 (*)    RDW 16.1 (*)    All other components within normal limits  BRAIN NATRIURETIC PEPTIDE - Abnormal; Notable for the following:    B Natriuretic Peptide 823.0 (*)    All other components within normal limits  COMPREHENSIVE METABOLIC PANEL  MAGNESIUM  CBC WITH DIFFERENTIAL/PLATELET  Randolm Idol, ED  Imaging Review Dg Chest 2 View  06/18/2015   CLINICAL DATA:  Dyspnea.  EXAM: CHEST  2 VIEW  COMPARISON:  02/19/2015  FINDINGS: There is  hyperinflation and marked cardiomegaly. There is mild aortic tortuosity. There is mild linear scarring in the mid lung are bilaterally. No confluent airspace consolidation. No large effusion. Pulmonary vasculature is normal.  IMPRESSION: Marked cardiomegaly.  Hyperinflation.  Stable linear scarring.   Electronically Signed   By: Andreas Newport M.D.   On: 06/18/2015 18:09     EKG Interpretation None      MDM   Final diagnoses:  CHF exacerbation    Filed Vitals:   06/19/15 0305  BP: 114/83  Pulse: 77  Temp: 97.9 F (36.6 C)  Resp: 20   I have reviewed nursing notes, vital signs, and all lab and all imaging results as noted above.  Patient was CHF exacerbation. Labs reviewed. IV Lasix given. Discussed case with hospitalists who will admit H&P for further management of CHF exacerbation as well as further evaluation of eye complaint. Patient d/w with Dr. Tomi Bamberger, agrees with plan.     Baron Sane, PA-C 06/19/15 0440  Dorie Rank, MD 06/19/15 [phone]

## 2015-06-18 NOTE — Patient Instructions (Signed)

## 2015-06-18 NOTE — ED Notes (Signed)
Pt given urinal, assisted to sit on side of bed. Pt noted to become increasingly short of breath, sats remained at 97% on room air

## 2015-06-19 ENCOUNTER — Encounter (HOSPITAL_COMMUNITY): Payer: Self-pay | Admitting: Internal Medicine

## 2015-06-19 DIAGNOSIS — I10 Essential (primary) hypertension: Secondary | ICD-10-CM | POA: Diagnosis present

## 2015-06-19 DIAGNOSIS — I5023 Acute on chronic systolic (congestive) heart failure: Secondary | ICD-10-CM | POA: Diagnosis present

## 2015-06-19 DIAGNOSIS — R21 Rash and other nonspecific skin eruption: Secondary | ICD-10-CM | POA: Diagnosis present

## 2015-06-19 DIAGNOSIS — I509 Heart failure, unspecified: Secondary | ICD-10-CM

## 2015-06-19 DIAGNOSIS — D649 Anemia, unspecified: Secondary | ICD-10-CM | POA: Diagnosis present

## 2015-06-19 LAB — COMPREHENSIVE METABOLIC PANEL
ALT: 22 U/L (ref 17–63)
ANION GAP: 13 (ref 5–15)
AST: 30 U/L (ref 15–41)
Albumin: 3.8 g/dL (ref 3.5–5.0)
Alkaline Phosphatase: 63 U/L (ref 38–126)
BUN: 64 mg/dL — ABNORMAL HIGH (ref 6–20)
CO2: 29 mmol/L (ref 22–32)
CREATININE: 2.35 mg/dL — AB (ref 0.61–1.24)
Calcium: 9.3 mg/dL (ref 8.9–10.3)
Chloride: 96 mmol/L — ABNORMAL LOW (ref 101–111)
GFR, EST AFRICAN AMERICAN: 32 mL/min — AB (ref >60)
GFR, EST NON AFRICAN AMERICAN: 27 mL/min — AB (ref >60)
GLUCOSE: 134 mg/dL — AB (ref 65–99)
Potassium: 3.1 mmol/L — ABNORMAL LOW (ref 3.5–5.1)
SODIUM: 138 mmol/L (ref 135–145)
Total Bilirubin: 0.9 mg/dL (ref 0.3–1.2)
Total Protein: 7.6 g/dL (ref 6.5–8.1)

## 2015-06-19 LAB — GLUCOSE, CAPILLARY
Glucose-Capillary: 109 mg/dL — ABNORMAL HIGH (ref 65–99)
Glucose-Capillary: 82 mg/dL (ref 65–99)
Glucose-Capillary: 99 mg/dL (ref 65–99)
Glucose-Capillary: 111 mg/dL — ABNORMAL HIGH (ref 65–99)

## 2015-06-19 LAB — CBC WITH DIFFERENTIAL/PLATELET
BASOS PCT: 1 % (ref 0–1)
Basophils Absolute: 0.1 10*3/uL (ref 0.0–0.1)
Eosinophils Absolute: 0.2 10*3/uL (ref 0.0–0.7)
Eosinophils Relative: 3 % (ref 0–5)
HCT: 36.5 % — ABNORMAL LOW (ref 39.0–52.0)
Hemoglobin: 11.9 g/dL — ABNORMAL LOW (ref 13.0–17.0)
LYMPHS PCT: 25 % (ref 12–46)
Lymphs Abs: 1.8 10*3/uL (ref 0.7–4.0)
MCH: 27.9 pg (ref 26.0–34.0)
MCHC: 32.6 g/dL (ref 30.0–36.0)
MCV: 85.7 fL (ref 78.0–100.0)
Monocytes Absolute: 0.6 10*3/uL (ref 0.1–1.0)
Monocytes Relative: 9 % (ref 3–12)
Neutro Abs: 4.6 10*3/uL (ref 1.7–7.7)
Neutrophils Relative %: 62 % (ref 43–77)
Platelets: 212 10*3/uL (ref 150–400)
RBC: 4.26 MIL/uL (ref 4.22–5.81)
RDW: 16.2 % — AB (ref 11.5–15.5)
WBC: 7.3 10*3/uL (ref 4.0–10.5)

## 2015-06-19 LAB — MAGNESIUM: Magnesium: 2.1 mg/dL (ref 1.7–2.4)

## 2015-06-19 LAB — MRSA PCR SCREENING: MRSA by PCR: NEGATIVE

## 2015-06-19 MED ORDER — APIXABAN 5 MG PO TABS
5.0000 mg | ORAL_TABLET | Freq: Two times a day (BID) | ORAL | Status: DC
  Administered 2015-06-19 – 2015-06-24 (×12): 5 mg via ORAL
  Filled 2015-06-19 (×14): qty 1

## 2015-06-19 MED ORDER — COLCHICINE 0.6 MG PO TABS
1.2000 mg | ORAL_TABLET | Freq: Every day | ORAL | Status: DC | PRN
  Filled 2015-06-19: qty 2

## 2015-06-19 MED ORDER — ALLOPURINOL 100 MG PO TABS
100.0000 mg | ORAL_TABLET | Freq: Two times a day (BID) | ORAL | Status: DC
  Administered 2015-06-19 – 2015-06-24 (×12): 100 mg via ORAL
  Filled 2015-06-19 (×14): qty 1

## 2015-06-19 MED ORDER — ALBUTEROL SULFATE (2.5 MG/3ML) 0.083% IN NEBU: 2.5000 mg | INHALATION_SOLUTION | Freq: Four times a day (QID) | RESPIRATORY_TRACT | Status: DC | PRN

## 2015-06-19 MED ORDER — ONDANSETRON HCL 4 MG/2ML IJ SOLN: 4.0000 mg | Freq: Four times a day (QID) | INTRAMUSCULAR | Status: DC | PRN

## 2015-06-19 MED ORDER — OXYCODONE-ACETAMINOPHEN 5-325 MG PO TABS
1.0000 | ORAL_TABLET | Freq: Four times a day (QID) | ORAL | Status: DC | PRN
  Administered 2015-06-19 – 2015-06-24 (×17): 1 via ORAL
  Filled 2015-06-19 (×18): qty 1

## 2015-06-19 MED ORDER — OXYCODONE-ACETAMINOPHEN 10-325 MG PO TABS: 1.0000 | ORAL_TABLET | Freq: Four times a day (QID) | ORAL | Status: DC | PRN

## 2015-06-19 MED ORDER — GLIPIZIDE 5 MG PO TABS
5.0000 mg | ORAL_TABLET | Freq: Two times a day (BID) | ORAL | Status: DC
  Filled 2015-06-19 (×3): qty 1

## 2015-06-19 MED ORDER — POLYETHYLENE GLYCOL 3350 17 GM/SCOOP PO POWD: 0.5000 | Freq: Once | ORAL | Status: DC

## 2015-06-19 MED ORDER — ISOSORBIDE MONONITRATE ER 60 MG PO TB24
60.0000 mg | ORAL_TABLET | Freq: Every day | ORAL | Status: DC
  Administered 2015-06-19 – 2015-06-24 (×6): 60 mg via ORAL
  Filled 2015-06-19 (×6): qty 1

## 2015-06-19 MED ORDER — SELENIUM 50 MCG PO TABS
200.0000 ug | ORAL_TABLET | Freq: Every day | ORAL | Status: DC
  Administered 2015-06-19 – 2015-06-24 (×6): 200 ug via ORAL
  Filled 2015-06-19 (×6): qty 4

## 2015-06-19 MED ORDER — CYANOCOBALAMIN 250 MCG PO TABS
250.0000 ug | ORAL_TABLET | Freq: Every day | ORAL | Status: DC
  Administered 2015-06-19 – 2015-06-24 (×6): 250 ug via ORAL
  Filled 2015-06-19 (×6): qty 1

## 2015-06-19 MED ORDER — CARVEDILOL 12.5 MG PO TABS
12.5000 mg | ORAL_TABLET | Freq: Two times a day (BID) | ORAL | Status: DC
  Administered 2015-06-19 – 2015-06-20 (×4): 12.5 mg via ORAL
  Filled 2015-06-19 (×7): qty 1

## 2015-06-19 MED ORDER — BACLOFEN 10 MG PO TABS
10.0000 mg | ORAL_TABLET | Freq: Three times a day (TID) | ORAL | Status: DC | PRN
  Filled 2015-06-19: qty 1

## 2015-06-19 MED ORDER — DEXTROSE 5 % IV SOLN
10.0000 mg/kg | Freq: Two times a day (BID) | INTRAVENOUS | Status: DC
  Administered 2015-06-19 – 2015-06-21 (×6): 685 mg via INTRAVENOUS
  Filled 2015-06-19 (×9): qty 13.7

## 2015-06-19 MED ORDER — POTASSIUM CHLORIDE CRYS ER 20 MEQ PO TBCR
20.0000 meq | EXTENDED_RELEASE_TABLET | Freq: Every day | ORAL | Status: DC
  Administered 2015-06-19 – 2015-06-24 (×6): 20 meq via ORAL
  Filled 2015-06-19 (×9): qty 1

## 2015-06-19 MED ORDER — SPIRONOLACTONE 25 MG PO TABS
25.0000 mg | ORAL_TABLET | Freq: Every day | ORAL | Status: DC
  Administered 2015-06-19 – 2015-06-23 (×5): 25 mg via ORAL
  Filled 2015-06-19 (×5): qty 1

## 2015-06-19 MED ORDER — GLIPIZIDE 5 MG PO TABS
5.0000 mg | ORAL_TABLET | Freq: Two times a day (BID) | ORAL | Status: DC
  Administered 2015-06-19 – 2015-06-24 (×11): 5 mg via ORAL
  Filled 2015-06-19 (×13): qty 1

## 2015-06-19 MED ORDER — OXYCODONE HCL 5 MG PO TABS
5.0000 mg | ORAL_TABLET | Freq: Four times a day (QID) | ORAL | Status: DC | PRN
  Administered 2015-06-19 – 2015-06-24 (×18): 5 mg via ORAL
  Filled 2015-06-19 (×19): qty 1

## 2015-06-19 MED ORDER — POLYETHYLENE GLYCOL 3350 17 G PO PACK
17.0000 g | PACK | Freq: Every day | ORAL | Status: DC
  Administered 2015-06-19 – 2015-06-24 (×6): 17 g via ORAL
  Filled 2015-06-19 (×6): qty 1

## 2015-06-19 MED ORDER — HYDRALAZINE HCL 50 MG PO TABS
50.0000 mg | ORAL_TABLET | Freq: Three times a day (TID) | ORAL | Status: DC
  Administered 2015-06-19 – 2015-06-24 (×17): 50 mg via ORAL
  Filled 2015-06-19 (×23): qty 1

## 2015-06-19 MED ORDER — FUROSEMIDE 10 MG/ML IJ SOLN
60.0000 mg | Freq: Three times a day (TID) | INTRAMUSCULAR | Status: DC
  Administered 2015-06-19 – 2015-06-23 (×13): 60 mg via INTRAVENOUS
  Filled 2015-06-19 (×20): qty 6

## 2015-06-19 MED ORDER — INSULIN ASPART 100 UNIT/ML ~~LOC~~ SOLN: 0.0000 [IU] | Freq: Three times a day (TID) | SUBCUTANEOUS | Status: DC

## 2015-06-19 MED ORDER — ACETAMINOPHEN 325 MG PO TABS
650.0000 mg | ORAL_TABLET | Freq: Four times a day (QID) | ORAL | Status: DC | PRN
  Administered 2015-06-20 – 2015-06-24 (×3): 650 mg via ORAL
  Filled 2015-06-19 (×3): qty 2

## 2015-06-19 MED ORDER — ACETAMINOPHEN 650 MG RE SUPP: 650.0000 mg | Freq: Four times a day (QID) | RECTAL | Status: DC | PRN

## 2015-06-19 MED ORDER — PREGABALIN 75 MG PO CAPS
75.0000 mg | ORAL_CAPSULE | Freq: Two times a day (BID) | ORAL | Status: DC
  Administered 2015-06-19 – 2015-06-24 (×12): 75 mg via ORAL
  Filled 2015-06-19 (×12): qty 1

## 2015-06-19 MED ORDER — SODIUM CHLORIDE 0.9 % IJ SOLN
3.0000 mL | Freq: Two times a day (BID) | INTRAMUSCULAR | Status: DC
  Administered 2015-06-19 – 2015-06-24 (×12): 3 mL via INTRAVENOUS

## 2015-06-19 MED ORDER — DOCUSATE SODIUM 100 MG PO CAPS
100.0000 mg | ORAL_CAPSULE | Freq: Two times a day (BID) | ORAL | Status: DC
  Administered 2015-06-19 – 2015-06-24 (×12): 100 mg via ORAL
  Filled 2015-06-19 (×14): qty 1

## 2015-06-19 MED ORDER — ONDANSETRON HCL 4 MG PO TABS: 4.0000 mg | ORAL_TABLET | Freq: Four times a day (QID) | ORAL | Status: DC | PRN

## 2015-06-19 NOTE — Progress Notes (Signed)
Utilization review completed. Jovonna Nickell, RN, BSN. 

## 2015-06-19 NOTE — Progress Notes (Signed)
Utilization review completed. Williette Loewe, RN, BSN. 

## 2015-06-19 NOTE — Progress Notes (Signed)
Around 0030 Patient transferred from the ED via stretcher to room 3E17 but has now transferred to room 3E18 for needing an Airborne room for shingles. Patient given pain medication for pain of shingles and is now resting. VSS. Telemetry placement of leads confirmed on admission by CMT. Oriented patient to Heart Failure floor and room equipment. Will continue to monitor patient to end of shift.

## 2015-06-19 NOTE — H&P (Signed)
[facility] History and Physical  Frank Davidson OVF:[id number] DOB: June 19, 1951 DOA: 06/18/2015  Referring physician: Ms.Piepenbrink. PCP: Frank Blinks, MD  Specialists: Dr.Bensimon. Cardiologist.  Chief Complaint: Shortness of breath.  HPI: Frank Davidson is a 64 y.o. male with history of chronic systolic heart failure last EF measured in January 2016 was 20% presents to the ER because of increasing shortness of breath and weight gain. Patient denies any fever chills chest pain. Patient also has some symptoms of orthopnea. Chest x-ray does not show anything acute and on exam patient has lower extremity edema and elevated JVD. Patient states he has been compliant with his medications but still has gained 8 pounds from his baseline weight. Patient has been admitted for CHF exacerbation. In addition patient is noticed to have rash with the cycles on his scalp on the right side and around the nasal bridge. This has been present for over the last 1 week and has been painful.  Review of Systems: As presented in the history of presenting illness, rest negative.  Past Medical History  Diagnosis Date  . Atrial fibrillation     PAF  . Hypertension   . COPD (chronic obstructive pulmonary disease)   . Ocular herpes   . Gout   . CHF (congestive heart failure)   . Chronic pain   . Hyperlipidemia   . Nonischemic cardiomyopathy Aug 2015    EF 15%  . Ocular herpes   . Neuropathy   . Left-sided weakness     "because of the arthritis and edema"  . PVD (peripheral vascular disease) May 2015    abnormal ABIs  . Memory impairment     "short term"  . CKD (chronic kidney disease), stage III     Dr. Mercy Moore follows-holding   . Fibromyalgia     neuropathy- hands, more than feet.  . Sleep apnea with use of continuous positive airway pressure (CPAP)     does not use cpap  . GERD (gastroesophageal reflux disease)     tums as needed  . Asthma   . Type II diabetes mellitus   . Stroke  X 5    last stroke aug 2014"; denies residual on 09/12/2014  . Osteoarthritis   . Arthritis     "knees, right elbow, shoulder" (09/12/2014)   Past Surgical History  Procedure Laterality Date  . 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 Tonsillectomy  age 49  . Esophagogastroduodenoscopy N/A 12/24/2013    Procedure: ESOPHAGOGASTRODUODENOSCOPY (EGD);  Surgeon: Inda Castle, MD;  Location: [facility];  Service: Endoscopy;  Laterality: N/A;  . Colonoscopy N/A 12/24/2013    Procedure: COLONOSCOPY;  Surgeon: Inda Castle, MD;  Location: [facility];  Service: Endoscopy;  Laterality: N/A;  . Joint replacement    . Cardiac catheterization  10/2006    normal coronary arteries;   Marland Kitchen Cataract extraction w/ intraocular lens  implant, bilateral Bilateral ~ 2008  . Total hip arthroplasty Left 09/12/2014    Procedure: LEFT TOTAL HIP ARTHROPLASTY ANTERIOR APPROACH;  Surgeon: Mauri Pole, MD;  Location: [facility];  Service: Orthopedics;  Laterality: Left;  . Right heart catheterization N/A 08/13/2014    Rt ht cath prior to hip surgery   Social History:  reports that he quit smoking about 8 years ago. His smoking use included Cigarettes. He has a 5 pack-year smoking history. He has never used smokeless tobacco. He reports that he uses illicit drugs (Cocaine and Marijuana). He reports that he  does not drink alcohol. Where does patient live at home. Can patient participate in ADLs? Yes.  Allergies  Allergen Reactions  . Corlanor [Ivabradine] Palpitations and Other (See Comments)    Kidney and heart problems, syncope  . Januvia [Sitagliptin] Other (See Comments)    "Almost killed me"   . Lunesta [Eszopiclone] Palpitations and Other (See Comments)    Kidney and heart problems, syncope  . Actos [Pioglitazone] Other (See Comments)    Caused blood in urine and fluid retention     Family History:  Family History  Problem Relation Age of Onset  . Cancer Mother   . Hypertension Mother   . Lung disease  Mother   . Diabetes Father   . Diabetes Sister   . Hypertension Sister   . Hypertension Brother   . Hypertension Sister   . Colon cancer Neg Hx       Prior to Admission medications   Medication Sig Start Date End Date Taking? Authorizing Provider  acetaminophen (TYLENOL) 500 MG tablet Take 500 mg by mouth every 6 (six) hours as needed for moderate pain or headache.   Yes Historical Provider, MD  albuterol (PROVENTIL HFA;VENTOLIN HFA) 108 (90 BASE) MCG/ACT inhaler Inhale 2 puffs into the lungs every 6 (six) hours as needed for wheezing or shortness of breath. 05/06/15  Yes Gay Filler Copland, MD  allopurinol (ZYLOPRIM) 100 MG tablet Take 1 tablet (100 mg total) by mouth daily with breakfast. Patient taking differently: Take 100 mg by mouth 2 (two) times daily.  12/20/14  Yes Gay Filler Copland, MD  apixaban (ELIQUIS) 5 MG TABS tablet Take 1 tablet (5 mg total) by mouth 2 (two) times daily. 02/04/15  Yes Shaune Pascal Bensimhon, MD  baclofen (LIORESAL) 10 MG tablet TAKE 1 TABLET EVERY 8 HOURS AS NEEDED FOR MUSCLE SPASM(S) 03/28/15  Yes Gay Filler Copland, MD  carvedilol (COREG) 25 MG tablet TAKE 1/2 TABLET TWICE DAILY  WITH  A  MEAL 04/17/15  Yes Jolaine Artist, MD  diclofenac (FLECTOR) 1.3 % PTCH Place 1 patch onto the skin 2 (two) times daily as needed (pain).   Yes Historical Provider, MD  docusate sodium 100 MG CAPS Take 100 mg by mouth 2 (two) times daily. 09/16/14  Yes Matthew Babish, PA-C  glipiZIDE (GLUCOTROL) 5 MG tablet Take 1 tablet (5 mg total) by mouth 2 (two) times daily. 12/20/14  Yes Gay Filler Copland, MD  [place] Berry 550 MG CAPS Take 1,100 mg by mouth 2 (two) times daily.   Yes Historical Provider, MD  hydrALAZINE (APRESOLINE) 50 MG tablet Take 1 tablet (50 mg total) by mouth 3 (three) times daily. 01/03/15  Yes Jolaine Artist, MD  isosorbide mononitrate (IMDUR) 60 MG 24 hr tablet TAKE 1 TABLET (60 MG TOTAL) BY MOUTH DAILY. 01/03/15  Yes Jolaine Artist, MD  meloxicam (MOBIC) 7.5  MG tablet Take 1 tablet (7.5 mg total) by mouth daily. 12/23/14  Yes Robyn Haber, MD  metolazone (ZAROXOLYN) 5 MG tablet Take 1 tablet (5 mg total) by mouth daily as needed (Swelling). 01/03/15  Yes Jolaine Artist, MD  Misc Natural Products (TURMERIC CURCUMIN) CAPS Take 1 capsule by mouth 3 (three) times daily.   Yes Historical Provider, MD  oxyCODONE-acetaminophen (PERCOCET) 10-325 MG per tablet Take 1 tablet by mouth 4 (four) times daily. scheduled 11/16/14  Yes Historical Provider, MD  polyethylene glycol powder (GLYCOLAX/MIRALAX) powder MIX 1 CAPFUL (17 GM) IN LIQUID AND DRINK EVERY DAY AS DIRECTED 03/28/15  Yes  Gay Filler Copland, MD  pregabalin (LYRICA) 75 MG capsule Take 1 capsule (75 mg total) by mouth daily. Patient taking differently: Take 75 mg by mouth 2 (two) times daily.  02/24/15  Yes Gay Filler Copland, MD  selenium 50 MCG TABS tablet Take 200 mcg by mouth daily.   Yes Historical Provider, MD  spironolactone (ALDACTONE) 25 MG tablet Take 1 tablet (25 mg total) by mouth daily. 03/24/15  Yes Jolaine Artist, MD  torsemide (DEMADEX) 20 MG tablet Take 2 tablets (40 mg total) by mouth 2 (two) times daily. 01/03/15  Yes Jolaine Artist, MD  vitamin B-12 (CYANOCOBALAMIN) 250 MCG tablet Take 250 mcg by mouth daily.   Yes Historical Provider, MD  ACCU-CHEK FASTCLIX LANCETS MISC Test blood sugar once daily. Dx code: E11.8 12/20/14   Darreld Mclean, MD  Alcohol Swabs (B-D SINGLE USE SWABS REGULAR) PADS Test blood sugar daily. Dx code: E11.8 12/20/14   Darreld Mclean, MD  Blood Glucose Calibration (ACCU-CHEK SMARTVIEW CONTROL) LIQD Test blood sugar once daily. Dx code: E11.8. 12/20/14   Darreld Mclean, MD  Blood Glucose Monitoring Suppl (ACCU-CHEK NANO SMARTVIEW) W/DEVICE KIT Test blood sugar daily. Dx code: E11.8 12/20/14   Darreld Mclean, MD  colchicine 0.6 MG tablet Take 1.2 mg by mouth daily as needed (Gout). Colcrys    Historical Provider, MD  glucose blood (ACCU-CHEK SMARTVIEW)  test strip Test blood sugar once daily. Dx code: E11.8 12/20/14   Gay Filler Copland, MD  potassium chloride SA (K-DUR,KLOR-CON) 20 MEQ tablet Take 1 tablet (20 mEq total) by mouth daily. Patient taking differently: Take 20 mEq by mouth daily as needed (swelling).  05/08/15   Robyn Haber, MD    Physical Exam: Filed Vitals:   06/18/15 2104 06/18/15 2241 06/18/15 2244 06/18/15 2343  BP: 131/72  113/55 112/67  Pulse: 81  71 69  Temp:    98.5 F (36.9 C)  TempSrc:    Oral  Resp: '16  15 20  ' Weight:  110.768 kg (244 lb 3.2 oz)    SpO2: 99%  96% 94%     General:  Moderately built and nourished.  Eyes: Anicteric. No pallor.  ENT: No discharge from the ears eyes nose and mouth.  Neck: Mildly elevated JVD. No mass felt.  Cardiovascular: S1 and S2 heard.  Respiratory: No rhonchi or crepitations.  Abdomen: Soft nontender bowel sounds present.  Skin: There is a vesicular rash on his scalp and the nasal bridge.  Musculoskeletal: Mild edema.  Psychiatric: Appears normal.  Neurologic: Alert awake oriented to time place and person. Moves all extremities.  Labs on Admission:  Basic Metabolic Panel:  Recent Labs Lab 06/14/15 1405 06/18/15 1833  NA 137 135  K 4.1 3.6  CL 92* 95*  CO2 30 24  GLUCOSE 86 92  BUN 62* 72*  CREATININE 3.06* 2.65*  CALCIUM 9.9 9.2   Liver Function Tests:  Recent Labs Lab 06/14/15 1405  AST 32  ALT 19  ALKPHOS 78  BILITOT 0.5  PROT 7.9  ALBUMIN 4.5   No results for input(s): LIPASE, AMYLASE in the last 168 hours. No results for input(s): AMMONIA in the last 168 hours. CBC:  Recent Labs Lab 06/14/15 1427 06/18/15 1833  WBC 7.1 6.3  HGB 12.8* 11.6*  HCT 41.0* 36.3*  MCV 86.7 86.6  PLT  --  193   Cardiac Enzymes: No results for input(s): CKTOTAL, CKMB, CKMBINDEX, TROPONINI in the last 168 hours.  BNP (last 3  results)  Recent Labs  11/23/14 1719 05/19/15 1229 06/18/15 1833  BNP 569.4* 343.6* 823.0*    ProBNP (last 3  results)  Recent Labs  08/08/14 1116 10/19/14 2158 10/25/14 1048  PROBNP 984.5* 1637.0* 3555.0*    CBG: No results for input(s): GLUCAP in the last 168 hours.  Radiological Exams on Admission: Dg Chest 2 View  06/18/2015   CLINICAL DATA:  Dyspnea.  EXAM: CHEST  2 VIEW  COMPARISON:  02/19/2015  FINDINGS: There is hyperinflation and marked cardiomegaly. There is mild aortic tortuosity. There is mild linear scarring in the mid lung are bilaterally. No confluent airspace consolidation. No large effusion. Pulmonary vasculature is normal.  IMPRESSION: Marked cardiomegaly.  Hyperinflation.  Stable linear scarring.   Electronically Signed   By: Andreas Newport M.D.   On: 06/18/2015 18:09    EKG: Independently reviewed. A. fib with rate control. Repeat EKG has been ordered.  Assessment/Plan Principal Problem:   Acute on chronic systolic heart failure Active Problems:   CKD (chronic kidney disease), stage III   Facial rash   Diabetes mellitus type 2, controlled   Essential hypertension   Chronic anemia   CHF (congestive heart failure)   1. Acute on chronic systolic heart failure last EF measured in January 2016 was 20%. - Patient did receive 80 mg IV Lasix in the ER. At this time I have placed patient on 60 mg IV Lasix every 8 hourly. Closely follow intake and output and daily weights and metabolic panel. Patient is not on any ace inhibitors or ARB due to renal failure. Continue spironolactone. 2. Herpes zoster - patient has risk of rash on the scalp and nasal bridge which I think is herpes zoster. Patient has been placed on acyclovir per pharmacy. Patient denies any visual symptoms don't see any obvious rash on the eye. 3. Chronic kidney disease stage III - creatinine appears to be at baseline. 4. Diabetes mellitus type 2 controlled - on glipizide and I have placed patient on sliding scale coverage. 5. Chronic anemia - follow CBC. 6. Paroxysmal atrial fibrillation presently rate  controlled on Apixaban with chads 2 vasc score of more than 2. 7. COPD presently not wheezing. 8. History of gout no acute symptoms.  I have reviewed patient's old chart and labs and procedures chest x-ray. Follow repeat EKG.   DVT Prophylaxis Apixaban. Code Status: Full code.  Family Communication: Discussed with patient.  Disposition Plan: Admit to inpatient.    Frank Davidson N. [facility] Pager [phone].  If 7PM-7AM, please contact night-coverage [email or web address] Password [email or web address] 06/19/2015, 1:07 AM

## 2015-06-19 NOTE — Progress Notes (Signed)
Patient seen and examined. Admitted after midnight secondary to increase SOB and weight gain of apporx 8 pounds. Denies CP, but endorses facial rash spreading to the back of his ears, with concerns for Herpes Zoster. Please referred to H&P written by Dr. Toniann Fail for further info/details on admission.  Plan: -IV lasix -daily weights, low sodium diet and strict I's and O's -daily BMET -will treat empirically with acyclovir  - follow clinical response  Frank Davidson [phone]

## 2015-06-19 NOTE — Progress Notes (Signed)
ANTIBIOTIC CONSULT NOTE - INITIAL  Pharmacy Consult for Acyclovir  Indication: Herpes zoster  Allergies  Allergen Reactions  . Corlanor [Ivabradine] Palpitations and Other (See Comments)    Kidney and heart problems, syncope  . Januvia [Sitagliptin] Other (See Comments)    "Almost killed me"   . Lunesta [Eszopiclone] Palpitations and Other (See Comments)    Kidney and heart problems, syncope  . Actos [Pioglitazone] Other (See Comments)    Caused blood in urine and fluid retention     Patient Measurements: Weight: 244 lb 3.2 oz (110.768 kg) Ht: 68 in IBW: 68 kg  Vital Signs: Temp: 98.5 F (36.9 C) (07/27 2343) Temp Source: Oral (07/27 2343) BP: 112/67 mmHg (07/27 2343) Pulse Rate: 69 (07/27 2343) Intake/Output from previous day: 07/27 0701 - 07/28 0700 In: -  Out: 1450 [Urine:1450] Intake/Output from this shift: Total I/O In: -  Out: 1450 [Urine:1450]  Labs:  Recent Labs  06/18/15 1833  WBC 6.3  HGB 11.6*  PLT 193  CREATININE 2.65*   Estimated Creatinine Clearance: 33.6 mL/min (by C-G formula based on Cr of 2.65). No results for input(s): VANCOTROUGH, VANCOPEAK, VANCORANDOM, GENTTROUGH, GENTPEAK, GENTRANDOM, TOBRATROUGH, TOBRAPEAK, TOBRARND, AMIKACINPEAK, AMIKACINTROU, AMIKACIN in the last 72 hours.   Microbiology: No results found for this or any previous visit (from the past 720 hour(s)).  Medical History: Past Medical History  Diagnosis Date  . Atrial fibrillation     PAF  . Hypertension   . COPD (chronic obstructive pulmonary disease)   . Ocular herpes   . Gout   . CHF (congestive heart failure)   . Chronic pain   . Hyperlipidemia   . Nonischemic cardiomyopathy Aug 2015    EF 15%  . Ocular herpes   . Neuropathy   . Left-sided weakness     "because of the arthritis and edema"  . PVD (peripheral vascular disease) May 2015    abnormal ABIs  . Memory impairment     "short term"  . CKD (chronic kidney disease), stage III     Dr. Briant Cedar  follows-holding   . Fibromyalgia     neuropathy- hands, more than feet.  . Sleep apnea with use of continuous positive airway pressure (CPAP)     does not use cpap  . GERD (gastroesophageal reflux disease)     tums as needed  . Asthma   . Type II diabetes mellitus   . Stroke X 5    last stroke aug 2014"; denies residual on 09/12/2014  . Osteoarthritis   . Arthritis     "knees, right elbow, shoulder" (09/12/2014)    Medications:  See electronic med rec  Assessment: 64 y.o. male presents with SOB, headache. Noted to have blisters on R side of head and face and redness to R eye. Noted h/o ocular herpes. To begin acyclovir for herpes zoster.  Scr 2.65, est normalized CrCl 28 ml/min. WBC wnl. Afeb.  Goal of Therapy:  Resolution of infection  Plan:  Acyclovir 685 mg (~10mg /kg IBW) IV q12h Will f/u renal function, micro data, and pt's clinical condition  Christoper Fabian, PharmD, BCPS Clinical pharmacist, pager [phone] 06/19/2015,1:14 AM

## 2015-06-20 ENCOUNTER — Encounter: Payer: Self-pay | Admitting: Family Medicine

## 2015-06-20 DIAGNOSIS — E119 Type 2 diabetes mellitus without complications: Secondary | ICD-10-CM

## 2015-06-20 DIAGNOSIS — D649 Anemia, unspecified: Secondary | ICD-10-CM

## 2015-06-20 DIAGNOSIS — I10 Essential (primary) hypertension: Secondary | ICD-10-CM

## 2015-06-20 LAB — BASIC METABOLIC PANEL
ANION GAP: 10 (ref 5–15)
BUN: 52 mg/dL — AB (ref 6–20)
CALCIUM: 9.3 mg/dL (ref 8.9–10.3)
CHLORIDE: 97 mmol/L — AB (ref 101–111)
CO2: 30 mmol/L (ref 22–32)
Creatinine, Ser: 2.08 mg/dL — ABNORMAL HIGH (ref 0.61–1.24)
GFR calc non Af Amer: 32 mL/min — ABNORMAL LOW (ref >60)
GFR, EST AFRICAN AMERICAN: 37 mL/min — AB (ref >60)
Glucose, Bld: 106 mg/dL — ABNORMAL HIGH (ref 65–99)
Potassium: 3.2 mmol/L — ABNORMAL LOW (ref 3.5–5.1)
SODIUM: 137 mmol/L (ref 135–145)

## 2015-06-20 LAB — GLUCOSE, CAPILLARY
GLUCOSE-CAPILLARY: 82 mg/dL (ref 65–99)
GLUCOSE-CAPILLARY: 66 mg/dL (ref 65–99)
Glucose-Capillary: 113 mg/dL — ABNORMAL HIGH (ref 65–99)
Glucose-Capillary: 153 mg/dL — ABNORMAL HIGH (ref 65–99)
Glucose-Capillary: 123 mg/dL — ABNORMAL HIGH (ref 65–99)

## 2015-06-20 NOTE — Progress Notes (Signed)
[facility] PROGRESS NOTE Interim History: 64 y.o. male with history of chronic systolic heart failure last EF measured in January 2016 was 20% presents to the ER because of increasing shortness of breath and weight gain. Patient denies any fever chills chest pain. Patient also has some symptoms of orthopnea. Chest x-ray does not show anything acute and on exam patient has lower extremity edema and elevated JVD. Patient states he has been compliant with his medications but still has gained 8 pounds from his baseline weight. Patient has been admitted for CHF exacerbation. In addition patient is noticed to have rash with the cycles on his scalp on the right side and around the nasal bridge. This has been present for over the last 1 week and has been painful.   Filed Weights   06/18/15 2241 06/19/15 0305 06/20/15 0550  Weight: 110.768 kg (244 lb 3.2 oz) 107.004 kg (235 lb 14.4 oz) 106.278 kg (234 lb 4.8 oz)        Intake/Output Summary (Last 24 hours) at 06/20/15 1530 Last data filed at 06/20/15 1017  Gross per 24 hour  Intake 1407.4 ml  Output   1925 ml  Net -517.6 ml     Assessment/Plan: 1-Acute on chronic systolic heart failure: neg troponin and no CP -patient has refused ICD -moving target for medication titration and diuretics adjustments -will continue IV lasix, low sodium diet, daily weights and strict intake and output -continue coreg, hydralazine, IMDUR and spironolactone  2-Facial rash: with high concerns for herpes zoster -will continue tx with acyclovir   3-Diabetes mellitus type 2, controlled -will continue SSI and glipizide  4-Essential hypertension: stable and well controlled -will continue current antihypertensive regimen   5-Chronic anemia: Hgb stable -no signs of overt bleeding  6-CKD (chronic kidney disease), stage III: stable and better that baseline -will continue monitoring renal function -especially while on IV diuretics   Code Status:  Full Family Communication: no family at bedside Disposition Plan: home when breathing and extra fluid improved.   Consultants:  None   Procedures: ECHO: last done in 11/26/14 - Left ventricle: The cavity size was normal. Wall thickness was normal. Systolic function was severely reduced. The estimated ejection fraction was 20%. LV apical false tendon. Severe global hypokinesis. The study is not technically sufficient to allow evaluation of LV diastolic function. - Left atrium: Severely dilated at 50 ml/m2. - Right atrium: Moderately dilated at 24 cm2.  Impressions: - Compared to the prior echo in 2014, there has been little if any improvement in EF to around 20%, severe global hypokinesis, biatrial enlargement.  Antibiotics:  None   Acyclovir   HPI/Subjective: Feeling better and breathing easier. Complaining of headaches, no fever, no CP.  Objective: Filed Vitals:   06/19/15 2120 06/20/15 0200 06/20/15 0549 06/20/15 0550  BP: 129/71 119/76 118/83   Pulse: 70 74 70   Temp: 98.3 F (36.8 C) 98 F (36.7 C) 98.1 F (36.7 C)   TempSrc: Oral Oral Oral   Resp: Height:      Weight:    106.278 kg (234 lb 4.8 oz)  SpO2: 100% 100% 100%      Exam:  General: Alert, awake, oriented x3, in no acute distress. Reports breathing is better and denies CP. HEENT: No bruits, no goiter. Positive skin pustular rash extending from scalp to right ear; concerning for Zoster Heart: Regular rate and rhythm, no rubs, no gallops. 1+ edema bilaterally  Lungs: Good air movement, bilateral  air movement.  Abdomen: Soft, nontender, nondistended, positive bowel sounds.  Neuro: Grossly intact, nonfocal.   Data Reviewed: Basic Metabolic Panel:  Recent Labs Lab 06/14/15 1405 06/18/15 1833 06/19/15 0404 06/20/15 0355  NA 137 135 138 137  K 4.1 3.6 3.1* 3.2*  CL 92* 95* 96* 97*  CO2 GLUCOSE 86 92 134* 106*  BUN 62* 72* 64* 52*  CREATININE 3.06* 2.65*  2.35* 2.08*  CALCIUM 9.9 9.2 9.3 9.3  MG  --   --  2.1  --    Liver Function Tests:  Recent Labs Lab 06/14/15 1405 06/19/15 0404  AST 32 30  ALT 19 22  ALKPHOS 78 63  BILITOT 0.5 0.9  PROT 7.9 7.6  ALBUMIN 4.5 3.8   CBC:  Recent Labs Lab 06/14/15 1427 06/18/15 1833 06/19/15 0404  WBC 7.1 6.3 7.3  NEUTROABS  --   --  4.6  HGB 12.8* 11.6* 11.9*  HCT 41.0* 36.3* 36.5*  MCV 86.7 86.6 85.7  PLT  --  193 212   BNP (last 3 results)  Recent Labs  11/23/14 1719 05/19/15 1229 06/18/15 1833  BNP 569.4* 343.6* 823.0*    ProBNP (last 3 results)  Recent Labs  08/08/14 1116 10/19/14 2158 10/25/14 1048  PROBNP 984.5* 1637.0* 3555.0*    CBG:  Recent Labs Lab 06/19/15 1148 06/19/15 1648 06/19/15 2120 06/20/15 0616 06/20/15 1126  GLUCAP 82 99 111* 82 113*    Recent Results (from the past 240 hour(s))  MRSA PCR Screening     Status: None   Collection Time: 06/19/15 11:57 AM  Result Value Ref Range Status   MRSA by PCR NEGATIVE NEGATIVE Final    Comment:        The GeneXpert MRSA Assay (FDA approved for NASAL specimens only), is one component of a comprehensive MRSA colonization surveillance program. It is not intended to diagnose MRSA infection nor to guide or monitor treatment for MRSA infections.      Studies: Dg Chest 2 View  06/18/2015   CLINICAL DATA:  Dyspnea.  EXAM: CHEST  2 VIEW  COMPARISON:  02/19/2015  FINDINGS: There is hyperinflation and marked cardiomegaly. There is mild aortic tortuosity. There is mild linear scarring in the mid lung are bilaterally. No confluent airspace consolidation. No large effusion. Pulmonary vasculature is normal.  IMPRESSION: Marked cardiomegaly.  Hyperinflation.  Stable linear scarring.   Electronically Signed   By: Ellery Plunk M.D.   On: 06/18/2015 18:09    Scheduled Meds: . acyclovir  10 mg/kg (Ideal) Intravenous Q12H  . allopurinol  100 mg Oral BID  . apixaban  5 mg Oral BID  . carvedilol  12.5 mg  Oral BID WC  . docusate sodium  100 mg Oral BID  . furosemide  60 mg Intravenous Q8H  . glipiZIDE  5 mg Oral BID WC  . hydrALAZINE  50 mg Oral 3 times per day  . insulin aspart  0-9 Units Subcutaneous TID WC  . isosorbide mononitrate  60 mg Oral Daily  . polyethylene glycol  17 g Oral Daily  . potassium chloride SA  20 mEq Oral Daily  . pregabalin  75 mg Oral BID  . selenium  200 mcg Oral Daily  . sodium chloride  3 mL Intravenous Q12H  . spironolactone  25 mg Oral Daily  . vitamin B-12  250 mcg Oral Daily   Continuous Infusions:   Time: 30 minutes  Vassie Loll  [facility] Pager [phone]  If 7PM-7AM, please contact night-coverage at [email or web address], password [email or web address] 06/20/2015, 3:30 PM  LOS: 2 days

## 2015-06-21 DIAGNOSIS — R21 Rash and other nonspecific skin eruption: Secondary | ICD-10-CM

## 2015-06-21 LAB — GLUCOSE, CAPILLARY
Glucose-Capillary: 108 mg/dL — ABNORMAL HIGH (ref 65–99)
Glucose-Capillary: 99 mg/dL (ref 65–99)
Glucose-Capillary: 87 mg/dL (ref 65–99)
Glucose-Capillary: 116 mg/dL — ABNORMAL HIGH (ref 65–99)

## 2015-06-21 LAB — BASIC METABOLIC PANEL
Anion gap: 8 (ref 5–15)
BUN: 56 mg/dL — AB (ref 6–20)
CHLORIDE: 98 mmol/L — AB (ref 101–111)
CO2: 29 mmol/L (ref 22–32)
CREATININE: 2.15 mg/dL — AB (ref 0.61–1.24)
Calcium: 9.2 mg/dL (ref 8.9–10.3)
GFR, EST AFRICAN AMERICAN: 35 mL/min — AB (ref >60)
GFR, EST NON AFRICAN AMERICAN: 31 mL/min — AB (ref >60)
Glucose, Bld: 99 mg/dL (ref 65–99)
Potassium: 3.7 mmol/L (ref 3.5–5.1)
Sodium: 135 mmol/L (ref 135–145)

## 2015-06-21 MED ORDER — CARVEDILOL 12.5 MG PO TABS
12.5000 mg | ORAL_TABLET | Freq: Two times a day (BID) | ORAL | Status: DC
  Administered 2015-06-21 – 2015-06-24 (×7): 12.5 mg via ORAL
  Filled 2015-06-21 (×9): qty 1

## 2015-06-21 MED ORDER — ACYCLOVIR 400 MG PO TABS
400.0000 mg | ORAL_TABLET | Freq: Two times a day (BID) | ORAL | Status: DC
  Administered 2015-06-21 – 2015-06-23 (×4): 400 mg via ORAL
  Filled 2015-06-21 (×5): qty 1

## 2015-06-21 NOTE — Progress Notes (Signed)
[facility] PROGRESS NOTE Interim History: 64 y.o. male with history of chronic systolic heart failure last EF measured in January 2016 was 20% presents to the ER because of increasing shortness of breath and weight gain. Patient denies any fever chills chest pain. Patient also has some symptoms of orthopnea. Chest x-ray does not show anything acute and on exam patient has lower extremity edema and elevated JVD. Patient states he has been compliant with his medications but still has gained 8 pounds from his baseline weight. Patient has been admitted for CHF exacerbation. In addition patient is noticed to have rash with the cycles on his scalp on the right side and around the nasal bridge. This has been present for over the last 1 week and has been painful.   Filed Weights   06/19/15 0305 06/20/15 0550 06/21/15 0629  Weight: 107.004 kg (235 lb 14.4 oz) 106.278 kg (234 lb 4.8 oz) 107.412 kg (236 lb 12.8 oz)        Intake/Output Summary (Last 24 hours) at 06/21/15 2226 Last data filed at 06/21/15 2200  Gross per 24 hour  Intake  947.4 ml  Output      0 ml  Net  947.4 ml     Assessment/Plan: 1-Acute on chronic systolic heart failure: neg troponin and no CP -patient has refused ICD -moving target for medication titration and diuretics adjustments -will continue IV lasix, low sodium diet, daily weights and strict intake and output -continue coreg, hydralazine, IMDUR and spironolactone -good diuresis -goal weight 231-232 pounds  2-Facial rash: with high concerns for herpes zoster -will continue tx with acyclovir   3-Diabetes mellitus type 2, controlled -will continue SSI and glipizide  4-Essential hypertension: stable and well controlled -will continue current antihypertensive regimen   5-Chronic anemia: Hgb stable -no signs of overt bleeding  6-CKD (chronic kidney disease), stage III: stable and better that baseline -will continue monitoring renal function -especially  while on IV diuretics   Code Status: Full Family Communication: no family at bedside Disposition Plan: home when breathing and extra fluid improved.   Consultants:  None   Procedures: ECHO: last done in 11/26/14 - Left ventricle: The cavity size was normal. Wall thickness was normal. Systolic function was severely reduced. The estimated ejection fraction was 20%. LV apical false tendon. Severe global hypokinesis. The study is not technically sufficient to allow evaluation of LV diastolic function. - Left atrium: Severely dilated at 50 ml/m2. - Right atrium: Moderately dilated at 24 cm2.  Impressions: - Compared to the prior echo in 2014, there has been little if any improvement in EF to around 20%, severe global hypokinesis, biatrial enlargement.  Antibiotics: none   Acyclovir   HPI/Subjective: Feeling better and breathing easier. No CP. Denies orthopnea as well. Weight slowly improving and close to his weight goal.  Objective: Filed Vitals:   06/21/15 0500 06/21/15 0629 06/21/15 1419 06/21/15 2222  BP: 132/70  108/72 122/82  Pulse: 51  74 61  Temp: 98 F (36.7 C)  97.9 F (36.6 C) 97.5 F (36.4 C)  TempSrc: Oral  Oral   Resp: Height:      Weight:  107.412 kg (236 lb 12.8 oz)    SpO2: 97%  100% 99%     Exam:  General: Alert, awake, oriented x3, in no acute distress. Reports breathing is better and denies CP. Facial rash also improving. Still not at weight goal (231-232 pounds) HEENT: No bruits, no goiter. Positive skin  pustular rash extending from scalp to right ear, much better and disappearing.  Heart: Regular rate and rhythm, no rubs, no gallops. Trace to 1+ edema bilaterally  Lungs: Good air movement, fin crackles. Abdomen: Soft, nontender, nondistended, positive bowel sounds.  Neuro: Grossly intact, nonfocal.   Data Reviewed: Basic Metabolic Panel:  Recent Labs Lab 06/18/15 1833 06/19/15 0404 06/20/15 0355 06/21/15 0300    NA 135 138 137 135  K 3.6 3.1* 3.2* 3.7  CL 95* 96* 97* 98*  CO2 24 29 30 29   GLUCOSE 92 134* 106* 99  BUN 72* 64* 52* 56*  CREATININE 2.65* 2.35* 2.08* 2.15*  CALCIUM 9.2 9.3 9.3 9.2  MG  --  2.1  --   --    Liver Function Tests:  Recent Labs Lab 06/19/15 0404  AST 30  ALT 22  ALKPHOS 63  BILITOT 0.9  PROT 7.6  ALBUMIN 3.8   CBC:  Recent Labs Lab 06/18/15 1833 06/19/15 0404  WBC 6.3 7.3  NEUTROABS  --  4.6  HGB 11.6* 11.9*  HCT 36.3* 36.5*  MCV 86.6 85.7  PLT 193 212   BNP (last 3 results)  Recent Labs  11/23/14 1719 05/19/15 1229 06/18/15 1833  BNP 569.4* 343.6* 823.0*    ProBNP (last 3 results)  Recent Labs  08/08/14 1116 10/19/14 2158 10/25/14 1048  PROBNP 984.5* 1637.0* 3555.0*    CBG:  Recent Labs Lab 06/20/15 2144 06/20/15 2258 06/21/15 0632 06/21/15 1125 06/21/15 1722  GLUCAP 66 123* 108* 99 87    Recent Results (from the past 240 hour(s))  MRSA PCR Screening     Status: None   Collection Time: 06/19/15 11:57 AM  Result Value Ref Range Status   MRSA by PCR NEGATIVE NEGATIVE Final    Comment:        The GeneXpert MRSA Assay (FDA approved for NASAL specimens only), is one component of a comprehensive MRSA colonization surveillance program. It is not intended to diagnose MRSA infection nor to guide or monitor treatment for MRSA infections.      Studies: No results found.  Scheduled Meds: . acyclovir  10 mg/kg (Ideal) Intravenous Q12H  . allopurinol  100 mg Oral BID  . apixaban  5 mg Oral BID  . carvedilol  12.5 mg Oral BID WC  . docusate sodium  100 mg Oral BID  . furosemide  60 mg Intravenous Q8H  . glipiZIDE  5 mg Oral BID WC  . hydrALAZINE  50 mg Oral 3 times per day  . insulin aspart  0-9 Units Subcutaneous TID WC  . isosorbide mononitrate  60 mg Oral Daily  . polyethylene glycol  17 g Oral Daily  . potassium chloride SA  20 mEq Oral Daily  . pregabalin  75 mg Oral BID  . selenium  200 mcg Oral Daily  .  sodium chloride  3 mL Intravenous Q12H  . spironolactone  25 mg Oral Daily  . vitamin B-12  250 mcg Oral Daily   Continuous Infusions:   Time: 30 minutes  Vassie Loll  [facility] Pager [phone] If 7PM-7AM, please contact night-coverage at [email or web address], password [facility] 06/21/2015, 10:26 PM  LOS: 3 days

## 2015-06-21 NOTE — Discharge Instructions (Signed)

## 2015-06-22 DIAGNOSIS — E1121 Type 2 diabetes mellitus with diabetic nephropathy: Secondary | ICD-10-CM

## 2015-06-22 LAB — BASIC METABOLIC PANEL
Anion gap: 11 (ref 5–15)
BUN: 50 mg/dL — ABNORMAL HIGH (ref 6–20)
CO2: 27 mmol/L (ref 22–32)
Calcium: 9.5 mg/dL (ref 8.9–10.3)
Chloride: 97 mmol/L — ABNORMAL LOW (ref 101–111)
Creatinine, Ser: 1.99 mg/dL — ABNORMAL HIGH (ref 0.61–1.24)
GFR calc non Af Amer: 33 mL/min — ABNORMAL LOW (ref >60)
GFR, EST AFRICAN AMERICAN: 39 mL/min — AB (ref >60)
Glucose, Bld: 110 mg/dL — ABNORMAL HIGH (ref 65–99)
POTASSIUM: 3.6 mmol/L (ref 3.5–5.1)
SODIUM: 135 mmol/L (ref 135–145)

## 2015-06-22 LAB — GLUCOSE, CAPILLARY
GLUCOSE-CAPILLARY: 95 mg/dL (ref 65–99)
GLUCOSE-CAPILLARY: 102 mg/dL — AB (ref 65–99)
GLUCOSE-CAPILLARY: 112 mg/dL — AB (ref 65–99)
Glucose-Capillary: 103 mg/dL — ABNORMAL HIGH (ref 65–99)

## 2015-06-22 MED ORDER — ZOLPIDEM TARTRATE 5 MG PO TABS
5.0000 mg | ORAL_TABLET | Freq: Once | ORAL | Status: AC
  Administered 2015-06-22: 5 mg via ORAL
  Filled 2015-06-22: qty 1

## 2015-06-22 NOTE — Progress Notes (Signed)
ANTIBIOTIC CONSULT NOTE - INITIAL  Pharmacy Consult for Acyclovir  Indication: Herpes zoster  Allergies  Allergen Reactions  . Corlanor [Ivabradine] Palpitations and Other (See Comments)    Kidney and heart problems, syncope  . Januvia [Sitagliptin] Other (See Comments)    "Almost killed me"   . Lunesta [Eszopiclone] Palpitations and Other (See Comments)    Kidney and heart problems, syncope  . Actos [Pioglitazone] Other (See Comments)    Caused blood in urine and fluid retention     Patient Measurements: Height: 5\' 9"  (175.3 cm) Weight: 237 lb 14 oz (107.9 kg) IBW/kg (Calculated) : 70.7 Ht: 68 in IBW: 68 kg  Vital Signs: Temp: 98 F (36.7 C) (07/31 0758) Temp Source: Oral (07/31 0758) BP: 123/79 mmHg (07/31 0758) Pulse Rate: 62 (07/31 0758) Intake/Output from previous day: 07/30 0701 - 07/31 0700 In: 713.7 [P.O.:600; IV Piggyback:113.7] Out: 300 [Urine:300] Intake/Output from this shift: Total I/O In: 240 [P.O.:240] Out: 450 [Urine:450]  Labs:  Recent Labs  06/20/15 0355 06/21/15 0300 06/22/15 0305  CREATININE 2.08* 2.15* 1.99*   Estimated Creatinine Clearance: 44.8 mL/min (by C-G formula based on Cr of 1.99). No results for input(s): VANCOTROUGH, VANCOPEAK, VANCORANDOM, GENTTROUGH, GENTPEAK, GENTRANDOM, TOBRATROUGH, TOBRAPEAK, TOBRARND, AMIKACINPEAK, AMIKACINTROU, AMIKACIN in the last 72 hours.   Microbiology: Recent Results (from the past 720 hour(s))  MRSA PCR Screening     Status: None   Collection Time: 06/19/15 11:57 AM  Result Value Ref Range Status   MRSA by PCR NEGATIVE NEGATIVE Final    Comment:        The GeneXpert MRSA Assay (FDA approved for NASAL specimens only), is one component of a comprehensive MRSA colonization surveillance program. It is not intended to diagnose MRSA infection nor to guide or monitor treatment for MRSA infections.     Medical History: Past Medical History  Diagnosis Date  . Atrial fibrillation     PAF  .  Hypertension   . COPD (chronic obstructive pulmonary disease)   . Ocular herpes   . Gout   . CHF (congestive heart failure)   . Chronic pain   . Hyperlipidemia   . Nonischemic cardiomyopathy Aug 2015    EF 15%  . Ocular herpes   . Neuropathy   . Left-sided weakness     "because of the arthritis and edema"  . PVD (peripheral vascular disease) May 2015    abnormal ABIs  . Memory impairment     "short term"  . CKD (chronic kidney disease), stage III     Dr. Briant Cedar follows-holding   . Fibromyalgia     neuropathy- hands, more than feet.  . Sleep apnea with use of continuous positive airway pressure (CPAP)     does not use cpap  . GERD (gastroesophageal reflux disease)     tums as needed  . Asthma   . Type II diabetes mellitus   . Stroke X 5    last stroke aug 2014"; denies residual on 09/12/2014  . Osteoarthritis   . Arthritis     "knees, right elbow, shoulder" (09/12/2014)    Medications:  See electronic med rec  Assessment: 64 y.o. male presents with SOB, headache. Noted to have blisters on R side of head and face and redness to R eye. Noted h/o ocular herpes. To begin acyclovir for herpes zoster.  Scr improved to 1.99, est normalized CrCl 37.7 ml/min. WBC wnl. Afeb.  Goal of Therapy:  Resolution of infection  Plan:  Acyclovir 400 mg PO  BID - dose appropriate Monitor renal function and and pt's clinical condition  Greggory Stallion, PharmD Clinical Pharmacist Pager # [phone] 06/22/2015 9:36 AM

## 2015-06-22 NOTE — Progress Notes (Signed)
[facility] PROGRESS NOTE Interim History: 64 y.o. male with history of chronic systolic heart failure last EF measured in January 2016 was 20% presents to the ER because of increasing shortness of breath and weight gain. Patient denies any fever chills chest pain. Patient also has some symptoms of orthopnea. Chest x-ray does not show anything acute and on exam patient has lower extremity edema and elevated JVD. Patient states he has been compliant with his medications but still has gained 8 pounds from his baseline weight. Patient has been admitted for CHF exacerbation. In addition patient is noticed to have rash with the cycles on his scalp on the right side and around the nasal bridge. This has been present for over the last 1 week and has been painful.   Filed Weights   06/21/15 0629 06/22/15 0555 06/22/15 0951  Weight: 107.412 kg (236 lb 12.8 oz) 107.9 kg (237 lb 14 oz) 106.867 kg (235 lb 9.6 oz)        Intake/Output Summary (Last 24 hours) at 06/22/15 1450 Last data filed at 06/22/15 1401  Gross per 24 hour  Intake  953.7 ml  Output    750 ml  Net  203.7 ml     Assessment/Plan: 1-Acute on chronic systolic heart failure: neg troponin and no CP -patient has refused ICD -moving target for medication titration and diuretics adjustments -will continue IV lasix, low sodium diet, daily weights and strict intake and output -continue coreg, hydralazine, IMDUR and spironolactone -good diuresis -goal weight 231-232 pounds; currently 235  2-Facial rash: with high concerns for herpes zoster -will continue tx with acyclovir   3-Diabetes mellitus type 2, controlled and stable -will continue SSI and glipizide  4-Essential hypertension: stable and well controlled -will continue current antihypertensive regimen  -Patient encouraged to follow a low-sodium diet  5-Chronic anemia: Hgb stable -no signs of overt bleeding  6-CKD (chronic kidney disease), stage III: stable and  better that baseline -will continue monitoring renal function -especially while on IV diuretics -Improving; creatinine on 87/31 was 1.99  Code Status: Full Family Communication: no family at bedside Disposition Plan: home when breathing and extra fluid improved.   Consultants:  None   Procedures: ECHO: last done in 11/26/14 - Left ventricle: The cavity size was normal. Wall thickness was normal. Systolic function was severely reduced. The estimated ejection fraction was 20%. LV apical false tendon. Severe global hypokinesis. The study is not technically sufficient to allow evaluation of LV diastolic function. - Left atrium: Severely dilated at 50 ml/m2. - Right atrium: Moderately dilated at 24 cm2.  Impressions: - Compared to the prior echo in 2014, there has been little if any improvement in EF to around 20%, severe global hypokinesis, biatrial enlargement.  Antibiotics: none   Acyclovir   HPI/Subjective: Feeling better and breathing easier. No CP. Denies orthopnea. Will continue another 24 hours on IV Lasix trying to have his weight as close to baseline goal. We'll discuss any further adjustment on medications with heart failure team on 06/23/15  Objective: Filed Vitals:   06/22/15 0555 06/22/15 0758 06/22/15 0951 06/22/15 1310  BP: 120/79 123/79  117/78  Pulse: 77 62  72  Temp: 97.6 F (36.4 C) 98 F (36.7 C)  98.4 F (36.9 C)  TempSrc: Oral Oral  Oral  Resp: Height:      Weight: 107.9 kg (237 lb 14 oz)  106.867 kg (235 lb 9.6 oz)   SpO2: 100% 97%  100%  Exam:  General: Alert, awake, oriented x3, in no acute distress. Reports breathing is better and denies CP. Facial rash continue improving. Still not at weight goal (which is 231-232 pounds); in fact his weight has gone up overnight. HEENT: No bruits, no goiter. Positive skin pustular rash extending from scalp to right ear, much better and disappearing.  Heart: Regular rate and rhythm,  no rubs, no gallops. Trace to 1+ edema bilaterally  Lungs: Good air movement, fin crackles. Abdomen: Soft, nontender, nondistended, positive bowel sounds.  Neuro: Grossly intact, nonfocal.   Data Reviewed: Basic Metabolic Panel:  Recent Labs Lab 06/18/15 1833 06/19/15 0404 06/20/15 0355 06/21/15 0300 06/22/15 0305  NA 135 138 137 135 135  K 3.6 3.1* 3.2* 3.7 3.6  CL 95* 96* 97* 98* 97*  CO2 24 29 30 29 27   GLUCOSE 92 134* 106* 99 110*  BUN 72* 64* 52* 56* 50*  CREATININE 2.65* 2.35* 2.08* 2.15* 1.99*  CALCIUM 9.2 9.3 9.3 9.2 9.5  MG  --  2.1  --   --   --    Liver Function Tests:  Recent Labs Lab 06/19/15 0404  AST 30  ALT 22  ALKPHOS 63  BILITOT 0.9  PROT 7.6  ALBUMIN 3.8   CBC:  Recent Labs Lab 06/18/15 1833 06/19/15 0404  WBC 6.3 7.3  NEUTROABS  --  4.6  HGB 11.6* 11.9*  HCT 36.3* 36.5*  MCV 86.6 85.7  PLT 193 212   BNP (last 3 results)  Recent Labs  11/23/14 1719 05/19/15 1229 06/18/15 1833  BNP 569.4* 343.6* 823.0*    ProBNP (last 3 results)  Recent Labs  08/08/14 1116 10/19/14 2158 10/25/14 1048  PROBNP 984.5* 1637.0* 3555.0*    CBG:  Recent Labs Lab 06/21/15 1125 06/21/15 1722 06/21/15 2219 06/22/15 0552 06/22/15 1057  GLUCAP 99 87 116* 95 102*    Recent Results (from the past 240 hour(s))  MRSA PCR Screening     Status: None   Collection Time: 06/19/15 11:57 AM  Result Value Ref Range Status   MRSA by PCR NEGATIVE NEGATIVE Final    Comment:        The GeneXpert MRSA Assay (FDA approved for NASAL specimens only), is one component of a comprehensive MRSA colonization surveillance program. It is not intended to diagnose MRSA infection nor to guide or monitor treatment for MRSA infections.      Studies: No results found.  Scheduled Meds: . acyclovir  400 mg Oral BID  . allopurinol  100 mg Oral BID  . apixaban  5 mg Oral BID  . carvedilol  12.5 mg Oral BID WC  . docusate sodium  100 mg Oral BID  .  furosemide  60 mg Intravenous Q8H  . glipiZIDE  5 mg Oral BID WC  . hydrALAZINE  50 mg Oral 3 times per day  . insulin aspart  0-9 Units Subcutaneous TID WC  . isosorbide mononitrate  60 mg Oral Daily  . polyethylene glycol  17 g Oral Daily  . potassium chloride SA  20 mEq Oral Daily  . pregabalin  75 mg Oral BID  . selenium  200 mcg Oral Daily  . sodium chloride  3 mL Intravenous Q12H  . spironolactone  25 mg Oral Daily  . vitamin B-12  250 mcg Oral Daily   Continuous Infusions:   Time: 30 minutes  Vassie Loll  [facility] Pager [phone] If 7PM-7AM, please contact night-coverage at [email or web address], password [facility] 06/22/2015, 2:50 PM  LOS: 4 days

## 2015-06-23 DIAGNOSIS — B029 Zoster without complications: Secondary | ICD-10-CM

## 2015-06-23 DIAGNOSIS — B0230 Zoster ocular disease, unspecified: Secondary | ICD-10-CM | POA: Insufficient documentation

## 2015-06-23 DIAGNOSIS — I5023 Acute on chronic systolic (congestive) heart failure: Principal | ICD-10-CM

## 2015-06-23 DIAGNOSIS — B0050 Herpesviral ocular disease, unspecified: Secondary | ICD-10-CM

## 2015-06-23 LAB — GLUCOSE, CAPILLARY
Glucose-Capillary: 111 mg/dL — ABNORMAL HIGH (ref 65–99)
Glucose-Capillary: 143 mg/dL — ABNORMAL HIGH (ref 65–99)
Glucose-Capillary: 91 mg/dL (ref 65–99)
Glucose-Capillary: 113 mg/dL — ABNORMAL HIGH (ref 65–99)

## 2015-06-23 LAB — BASIC METABOLIC PANEL
Anion gap: 11 (ref 5–15)
BUN: 44 mg/dL — AB (ref 6–20)
CO2: 27 mmol/L (ref 22–32)
Calcium: 9.9 mg/dL (ref 8.9–10.3)
Chloride: 97 mmol/L — ABNORMAL LOW (ref 101–111)
Creatinine, Ser: 1.96 mg/dL — ABNORMAL HIGH (ref 0.61–1.24)
GFR calc non Af Amer: 34 mL/min — ABNORMAL LOW (ref >60)
GFR, EST AFRICAN AMERICAN: 40 mL/min — AB (ref >60)
Glucose, Bld: 97 mg/dL (ref 65–99)
POTASSIUM: 4 mmol/L (ref 3.5–5.1)
SODIUM: 135 mmol/L (ref 135–145)

## 2015-06-23 MED ORDER — TORSEMIDE 20 MG PO TABS
40.0000 mg | ORAL_TABLET | Freq: Two times a day (BID) | ORAL | Status: DC
  Administered 2015-06-24: 40 mg via ORAL
  Filled 2015-06-23 (×3): qty 2

## 2015-06-23 MED ORDER — PREDNISOLONE ACETATE 1 % OP SUSP
1.0000 [drp] | Freq: Four times a day (QID) | OPHTHALMIC | Status: DC
  Administered 2015-06-23 – 2015-06-24 (×4): 1 [drp] via OPHTHALMIC
  Filled 2015-06-23: qty 1

## 2015-06-23 MED ORDER — ACYCLOVIR 800 MG PO TABS
800.0000 mg | ORAL_TABLET | Freq: Every day | ORAL | Status: DC
  Administered 2015-06-23 – 2015-06-24 (×5): 800 mg via ORAL
  Filled 2015-06-23 (×9): qty 1

## 2015-06-23 NOTE — Consult Note (Addendum)
OPHTHALMOLOGY CONSULT NOTE  Date: 06/23/15 Time: 12:55 PM  Patient Name: Frank Davidson  DOB: 1950-11-28 MRN: [id number]  Reason for Consult: Eye blurring and irritation  HPI:  This is a 64 y.o. male who was addmited to [facility] for CHF exacerbation. While admitted the patient was diagnosed with V1 distribution Zoster and started on acyclovir. Since that time the patient has expressed concern over eye redness/irritation and mild blurring x 5 days.    Prior to Admission medications   Medication Sig Start Date End Date Taking? Authorizing Provider  acetaminophen (TYLENOL) 500 MG tablet Take 500 mg by mouth every 6 (six) hours as needed for moderate pain or headache.   Yes Historical Provider, MD  albuterol (PROVENTIL HFA;VENTOLIN HFA) 108 (90 BASE) MCG/ACT inhaler Inhale 2 puffs into the lungs every 6 (six) hours as needed for wheezing or shortness of breath. 05/06/15  Yes Gay Filler Copland, MD  allopurinol (ZYLOPRIM) 100 MG tablet Take 1 tablet (100 mg total) by mouth daily with breakfast. Patient taking differently: Take 100 mg by mouth 2 (two) times daily.  12/20/14  Yes Gay Filler Copland, MD  apixaban (ELIQUIS) 5 MG TABS tablet Take 1 tablet (5 mg total) by mouth 2 (two) times daily. 02/04/15  Yes Shaune Pascal Bensimhon, MD  baclofen (LIORESAL) 10 MG tablet TAKE 1 TABLET EVERY 8 HOURS AS NEEDED FOR MUSCLE SPASM(S) 03/28/15  Yes Gay Filler Copland, MD  carvedilol (COREG) 25 MG tablet TAKE 1/2 TABLET TWICE DAILY  WITH  A  MEAL 04/17/15  Yes Jolaine Artist, MD  diclofenac (FLECTOR) 1.3 % PTCH Place 1 patch onto the skin 2 (two) times daily as needed (pain).   Yes Historical Provider, MD  docusate sodium 100 MG CAPS Take 100 mg by mouth 2 (two) times daily. 09/16/14  Yes Matthew Babish, PA-C  glipiZIDE (GLUCOTROL) 5 MG tablet Take 1 tablet (5 mg total) by mouth 2 (two) times daily. 12/20/14  Yes Gay Filler Copland, MD  [place] Berry 550 MG CAPS Take 1,100 mg by mouth 2 (two) times daily.    Yes Historical Provider, MD  hydrALAZINE (APRESOLINE) 50 MG tablet Take 1 tablet (50 mg total) by mouth 3 (three) times daily. 01/03/15  Yes Jolaine Artist, MD  isosorbide mononitrate (IMDUR) 60 MG 24 hr tablet TAKE 1 TABLET (60 MG TOTAL) BY MOUTH DAILY. 01/03/15  Yes Jolaine Artist, MD  meloxicam (MOBIC) 7.5 MG tablet Take 1 tablet (7.5 mg total) by mouth daily. 12/23/14  Yes Robyn Haber, MD  metolazone (ZAROXOLYN) 5 MG tablet Take 1 tablet (5 mg total) by mouth daily as needed (Swelling). 01/03/15  Yes Jolaine Artist, MD  Misc Natural Products (TURMERIC CURCUMIN) CAPS Take 1 capsule by mouth 3 (three) times daily.   Yes Historical Provider, MD  oxyCODONE-acetaminophen (PERCOCET) 10-325 MG per tablet Take 1 tablet by mouth 4 (four) times daily. scheduled 11/16/14  Yes Historical Provider, MD  polyethylene glycol powder (GLYCOLAX/MIRALAX) powder MIX 1 CAPFUL (17 GM) IN LIQUID AND DRINK EVERY DAY AS DIRECTED 03/28/15  Yes Gay Filler Copland, MD  pregabalin (LYRICA) 75 MG capsule Take 1 capsule (75 mg total) by mouth daily. Patient taking differently: Take 75 mg by mouth 2 (two) times daily.  02/24/15  Yes Gay Filler Copland, MD  selenium 50 MCG TABS tablet Take 200 mcg by mouth daily.   Yes Historical Provider, MD  spironolactone (ALDACTONE) 25 MG tablet Take 1 tablet (25 mg total) by mouth daily. 03/24/15  Yes Jolaine Artist, MD  torsemide (DEMADEX) 20 MG tablet Take 2 tablets (40 mg total) by mouth 2 (two) times daily. 01/03/15  Yes Jolaine Artist, MD  vitamin B-12 (CYANOCOBALAMIN) 250 MCG tablet Take 250 mcg by mouth daily.   Yes Historical Provider, MD  ACCU-CHEK FASTCLIX LANCETS MISC Test blood sugar once daily. Dx code: E11.8 12/20/14   Darreld Mclean, MD  Alcohol Swabs (B-D SINGLE USE SWABS REGULAR) PADS Test blood sugar daily. Dx code: E11.8 12/20/14   Darreld Mclean, MD  Blood Glucose Calibration (ACCU-CHEK SMARTVIEW CONTROL) LIQD Test blood sugar once daily. Dx code: E11.8.  12/20/14   Darreld Mclean, MD  Blood Glucose Monitoring Suppl (ACCU-CHEK NANO SMARTVIEW) W/DEVICE KIT Test blood sugar daily. Dx code: E11.8 12/20/14   Darreld Mclean, MD  colchicine 0.6 MG tablet Take 1.2 mg by mouth daily as needed (Gout). Colcrys    Historical Provider, MD  glucose blood (ACCU-CHEK SMARTVIEW) test strip Test blood sugar once daily. Dx code: E11.8 12/20/14   Gay Filler Copland, MD  potassium chloride SA (K-DUR,KLOR-CON) 20 MEQ tablet Take 1 tablet (20 mEq total) by mouth daily. Patient taking differently: Take 20 mEq by mouth daily as needed (swelling).  05/08/15   Robyn Haber, MD    Past Medical History  Diagnosis Date  . Atrial fibrillation     PAF  . Hypertension   . COPD (chronic obstructive pulmonary disease)   . Ocular herpes   . Gout   . CHF (congestive heart failure)   . Chronic pain   . Hyperlipidemia   . Nonischemic cardiomyopathy Aug 2015    EF 15%  . Ocular herpes   . Neuropathy   . Left-sided weakness     "because of the arthritis and edema"  . PVD (peripheral vascular disease) May 2015    abnormal ABIs  . Memory impairment     "short term"  . CKD (chronic kidney disease), stage III     Dr. Mercy Moore follows-holding   . Fibromyalgia     neuropathy- hands, more than feet.  . Sleep apnea with use of continuous positive airway pressure (CPAP)     does not use cpap  . GERD (gastroesophageal reflux disease)     tums as needed  . Asthma   . Type II diabetes mellitus   . Stroke X 5    last stroke aug 2014"; denies residual on 09/12/2014  . Osteoarthritis   . Arthritis     "knees, right elbow, shoulder" (09/12/2014)    family history includes Cancer in his mother; Diabetes in his father and sister; Hypertension in his brother, mother, sister, and sister; Lung disease in his mother. There is no history of Colon cancer.  Social History   Occupational History  . Not on file.   Social History Main Topics  . Smoking status: Former Smoker  -- 0.50 packs/day for 10 years    Types: Cigarettes    Quit date: 12/18/2006  . Smokeless tobacco: Never Used  . Alcohol Use: No     Comment: Quit 2008  . Drug Use: Yes    Special: Cocaine, Marijuana     Comment: last used marijuana and cocaine 2008  . Sexual Activity: Not Currently    Allergies  Allergen Reactions  . Corlanor [Ivabradine] Palpitations and Other (See Comments)    Kidney and heart problems, syncope  . Januvia [Sitagliptin] Other (See Comments)    "Almost killed me"   . Johnnye Sima [  Eszopiclone] Palpitations and Other (See Comments)    Kidney and heart problems, syncope  . Actos [Pioglitazone] Other (See Comments)    Caused blood in urine and fluid retention     ROS: Positive as above, otherwise negative.  EXAM:  Mental Status: A&O x 3   Base Exam: Right Eye Left Eye  Visual Acuity (At near) 20/40 20/30  IOP (Tonopen) 14   Pupillary Exam No RAPD  No RAPD  Motility Full  Full   Confrontation VF Full  Full    Anterior Segment Exam    Lids/Lashes Few crusted lesions WNL  Conjuctiva 1+ inflamation White and Quiet  Cornea Clear Clear  Anterior Chamber 1+ cell Deep and Quiet  Iris Round, Reactive Round, Reactive  Lens Clear Clear  Vitreous WNL WNL   Poster Segment Exam  NDFE   Disc Sharp   CD ratio    Macula Flat   Vessels WNL   Periphery Attached    Radiographic Studies Reviewed: None  Assessment and Recommendation: Herpes zoster ophthalmicus:  -Continue Acyclovir 800 mg 5 x daily, or if patient preference after discharge, Valtrex 1 gram 3 x daily. Dont DC until I see in clinic.   - Will treat ac segment inflammation with Pred forte OD QID.  -Follow up with me in 1 week for repeat examination (call office to arrange, Card given to patient)  -Please call if any changes    Please call with any questions.  Jola Schmidt MD [facility] [phone]

## 2015-06-23 NOTE — Consult Note (Signed)
Advanced Heart Failure Team Consult Note  Referring Physician: Dr Dyann Kief  Primary Physician: Dr Lorelei Pont  Primary HF: Dr Haroldine Laws   Reason for Consultation: Heart Failure   HPI:   [name] is a 64 yo male with a hx of obesity, PAF, HTN, COPD, HTN, gout, DM 2, CVA and CHF due to NICM. Refuses ICD and asked multiple times.   He is followed closely in the HF clinic and was last seen June 27th. At that time he had increased volume and torsemide was increased to 40 mg twice a day. Limited bb up titration. Intolerant entresto and corlanor due to syncope.   He was evaluated by PCP on July 27 with increased dyspnea, headache and chills. Shingles noted scalp, R eye, and nose. Creatinine at that time was 2.6. PCP recommended---> ED for evaluation.     Admitted with increased dyspnea and weight gain. Pertinent admission labs included creatinine 2.35, K 3.1, Hgb 11.9, and BNP 823 which is up from his baseline.  Started on IV lasix 60 mg three times daily.  Limited diuresis. Weight unchanged.    Denies SOB. Main complaint headache and R eye discomfort.    Creatinine 2.08> 1.99>pending.   Review of Systems: [y] = yes, '[ ]'  = no   General: Weight gain [Y ]; Weight loss '[ ]' ; Anorexia '[ ]' ; Fatigue [ Y]; Fever '[ ]' ; Chills '[ ]' ; Weakness [ Y]  Cardiac: Chest pain/pressure '[ ]' ; Resting SOB '[ ]' ; Exertional SOB [Y ]; Orthopnea [ Y]; Pedal Edema '[ ]' ; Palpitations '[ ]' ; Syncope '[ ]' ; Presyncope '[ ]' ; Paroxysmal nocturnal dyspnea'[ ]'   Pulmonary: Cough '[ ]' ; Wheezing'[ ]' ; Hemoptysis'[ ]' ; Sputum '[ ]' ; Snoring '[ ]'   GI: Vomiting'[ ]' ; Dysphagia'[ ]' ; Melena'[ ]' ; Hematochezia '[ ]' ; Heartburn'[ ]' ; Abdominal pain '[ ]' ; Constipation '[ ]' ; Diarrhea '[ ]' ; BRBPR '[ ]'   GU: Hematuria'[ ]' ; Dysuria '[ ]' ; Nocturia'[ ]'   Vascular: Pain in legs with walking '[ ]' ; Pain in feet with lying flat '[ ]' ; Non-healing sores '[ ]' ; Stroke '[ ]' ; TIA '[ ]' ; Slurred speech '[ ]' ;  Neuro: Headaches[Y ]; Vertigo'[ ]' ; Seizures'[ ]' ; Paresthesias'[ ]' ;Blurred vision '[ ]' ; Diplopia  '[ ]' ; Vision changes '[ ]'   Ortho/Skin: Arthritis [Y ]; Joint pain [ Y]; Muscle pain '[ ]' ; Joint swelling '[ ]' ; Back Pain '[ ]' ; Rash '[ ]'   Psych: Depression'[ ]' ; Anxiety'[ ]'   Heme: Bleeding problems '[ ]' ; Clotting disorders '[ ]' ; Anemia '[ ]'   Endocrine: Diabetes [Y ]; Thyroid dysfunction'[ ]'   Home Medications Prior to Admission medications   Medication Sig Start Date End Date Taking? Authorizing Provider  acetaminophen (TYLENOL) 500 MG tablet Take 500 mg by mouth every 6 (six) hours as needed for moderate pain or headache.   Yes Historical Provider, MD  albuterol (PROVENTIL HFA;VENTOLIN HFA) 108 (90 BASE) MCG/ACT inhaler Inhale 2 puffs into the lungs every 6 (six) hours as needed for wheezing or shortness of breath. 05/06/15  Yes Gay Filler Copland, MD  allopurinol (ZYLOPRIM) 100 MG tablet Take 1 tablet (100 mg total) by mouth daily with breakfast. Patient taking differently: Take 100 mg by mouth 2 (two) times daily.  12/20/14  Yes Gay Filler Copland, MD  apixaban (ELIQUIS) 5 MG TABS tablet Take 1 tablet (5 mg total) by mouth 2 (two) times daily. 02/04/15  Yes Jolaine Artist, MD  baclofen (LIORESAL) 10 MG tablet TAKE 1 TABLET EVERY 8 HOURS AS NEEDED FOR MUSCLE SPASM(S) 03/28/15  Yes Darreld Mclean, MD  carvedilol (  COREG) 25 MG tablet TAKE 1/2 TABLET TWICE DAILY  WITH  A  MEAL 04/17/15  Yes Jolaine Artist, MD  diclofenac (FLECTOR) 1.3 % PTCH Place 1 patch onto the skin 2 (two) times daily as needed (pain).   Yes Historical Provider, MD  docusate sodium 100 MG CAPS Take 100 mg by mouth 2 (two) times daily. 09/16/14  Yes Matthew Babish, PA-C  glipiZIDE (GLUCOTROL) 5 MG tablet Take 1 tablet (5 mg total) by mouth 2 (two) times daily. 12/20/14  Yes Gay Filler Copland, MD  [place] Berry 550 MG CAPS Take 1,100 mg by mouth 2 (two) times daily.   Yes Historical Provider, MD  hydrALAZINE (APRESOLINE) 50 MG tablet Take 1 tablet (50 mg total) by mouth 3 (three) times daily. 01/03/15  Yes Jolaine Artist, MD   isosorbide mononitrate (IMDUR) 60 MG 24 hr tablet TAKE 1 TABLET (60 MG TOTAL) BY MOUTH DAILY. 01/03/15  Yes Jolaine Artist, MD  meloxicam (MOBIC) 7.5 MG tablet Take 1 tablet (7.5 mg total) by mouth daily. 12/23/14  Yes Robyn Haber, MD  metolazone (ZAROXOLYN) 5 MG tablet Take 1 tablet (5 mg total) by mouth daily as needed (Swelling). 01/03/15  Yes Jolaine Artist, MD  Misc Natural Products (TURMERIC CURCUMIN) CAPS Take 1 capsule by mouth 3 (three) times daily.   Yes Historical Provider, MD  oxyCODONE-acetaminophen (PERCOCET) 10-325 MG per tablet Take 1 tablet by mouth 4 (four) times daily. scheduled 11/16/14  Yes Historical Provider, MD  polyethylene glycol powder (GLYCOLAX/MIRALAX) powder MIX 1 CAPFUL (17 GM) IN LIQUID AND DRINK EVERY DAY AS DIRECTED 03/28/15  Yes Gay Filler Copland, MD  pregabalin (LYRICA) 75 MG capsule Take 1 capsule (75 mg total) by mouth daily. Patient taking differently: Take 75 mg by mouth 2 (two) times daily.  02/24/15  Yes Gay Filler Copland, MD  selenium 50 MCG TABS tablet Take 200 mcg by mouth daily.   Yes Historical Provider, MD  spironolactone (ALDACTONE) 25 MG tablet Take 1 tablet (25 mg total) by mouth daily. 03/24/15  Yes Jolaine Artist, MD  torsemide (DEMADEX) 20 MG tablet Take 2 tablets (40 mg total) by mouth 2 (two) times daily. 01/03/15  Yes Jolaine Artist, MD  vitamin B-12 (CYANOCOBALAMIN) 250 MCG tablet Take 250 mcg by mouth daily.   Yes Historical Provider, MD  ACCU-CHEK FASTCLIX LANCETS MISC Test blood sugar once daily. Dx code: E11.8 12/20/14   Darreld Mclean, MD  Alcohol Swabs (B-D SINGLE USE SWABS REGULAR) PADS Test blood sugar daily. Dx code: E11.8 12/20/14   Darreld Mclean, MD  Blood Glucose Calibration (ACCU-CHEK SMARTVIEW CONTROL) LIQD Test blood sugar once daily. Dx code: E11.8. 12/20/14   Darreld Mclean, MD  Blood Glucose Monitoring Suppl (ACCU-CHEK NANO SMARTVIEW) W/DEVICE KIT Test blood sugar daily. Dx code: E11.8 12/20/14   Darreld Mclean, MD  colchicine 0.6 MG tablet Take 1.2 mg by mouth daily as needed (Gout). Colcrys    Historical Provider, MD  glucose blood (ACCU-CHEK SMARTVIEW) test strip Test blood sugar once daily. Dx code: E11.8 12/20/14   Gay Filler Copland, MD  potassium chloride SA (K-DUR,KLOR-CON) 20 MEQ tablet Take 1 tablet (20 mEq total) by mouth daily. Patient taking differently: Take 20 mEq by mouth daily as needed (swelling).  05/08/15   Robyn Haber, MD    Past Medical History: Past Medical History  Diagnosis Date  . Atrial fibrillation     PAF  . Hypertension   . COPD (chronic  obstructive pulmonary disease)   . Ocular herpes   . Gout   . CHF (congestive heart failure)   . Chronic pain   . Hyperlipidemia   . Nonischemic cardiomyopathy Aug 2015    EF 15%  . Ocular herpes   . Neuropathy   . Left-sided weakness     "because of the arthritis and edema"  . PVD (peripheral vascular disease) May 2015    abnormal ABIs  . Memory impairment     "short term"  . CKD (chronic kidney disease), stage III     Dr. Mercy Moore follows-holding   . Fibromyalgia     neuropathy- hands, more than feet.  . Sleep apnea with use of continuous positive airway pressure (CPAP)     does not use cpap  . GERD (gastroesophageal reflux disease)     tums as needed  . Asthma   . Type II diabetes mellitus   . Stroke X 5    last stroke aug 2014"; denies residual on 09/12/2014  . Osteoarthritis   . Arthritis     "knees, right elbow, shoulder" (09/12/2014)    Past Surgical History: Past Surgical History  Procedure Laterality Date  . 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 Tonsillectomy  age 51  . Esophagogastroduodenoscopy N/A 12/24/2013    Procedure: ESOPHAGOGASTRODUODENOSCOPY (EGD);  Surgeon: Inda Castle, MD;  Location: [facility];  Service: Endoscopy;  Laterality: N/A;  . Colonoscopy N/A 12/24/2013    Procedure: COLONOSCOPY;  Surgeon: Inda Castle, MD;  Location: [facility];  Service: Endoscopy;   Laterality: N/A;  . Joint replacement    . Cardiac catheterization  10/2006    normal coronary arteries;   Marland Kitchen Cataract extraction w/ intraocular lens  implant, bilateral Bilateral ~ 2008  . Total hip arthroplasty Left 09/12/2014    Procedure: LEFT TOTAL HIP ARTHROPLASTY ANTERIOR APPROACH;  Surgeon: Mauri Pole, MD;  Location: [facility];  Service: Orthopedics;  Laterality: Left;  . Right heart catheterization N/A 08/13/2014    Rt ht cath prior to hip surgery    Family History: Family History  Problem Relation Age of Onset  . Cancer Mother   . Hypertension Mother   . Lung disease Mother   . Diabetes Father   . Diabetes Sister   . Hypertension Sister   . Hypertension Brother   . Hypertension Sister   . Colon cancer Neg Hx     Social History: History   Social History  . Marital Status: Divorced    Spouse Name: N/A  . Number of Children: N/A  . Years of Education: N/A   Social History Main Topics  . Smoking status: Former Smoker -- 0.50 packs/day for 10 years    Types: Cigarettes    Quit date: 12/18/2006  . Smokeless tobacco: Never Used  . Alcohol Use: No     Comment: Quit 2008  . Drug Use: Yes    Special: Cocaine, Marijuana     Comment: last used marijuana and cocaine 2008  . Sexual Activity: Not Currently   Other Topics Concern  . None   Social History Narrative    Allergies:  Allergies  Allergen Reactions  . Corlanor [Ivabradine] Palpitations and Other (See Comments)    Kidney and heart problems, syncope  . Januvia [Sitagliptin] Other (See Comments)    "Almost killed me"   . Lunesta [Eszopiclone] Palpitations and Other (See Comments)    Kidney and heart problems, syncope  . Actos [Pioglitazone] Other (  See Comments)    Caused blood in urine and fluid retention     Objective:    Vital Signs:   Temp:  [98.3 F (36.8 C)-98.6 F (37 C)] 98.3 F (36.8 C) (08/01 0614) Pulse Rate:  [68-76] 72 (08/01 0614) Resp:  [18] 18 (08/01 0614) BP: (112-117)/(72-78)  114/72 mmHg (08/01 0614) SpO2:  [100 %] 100 % (08/01 4975) Weight:  [235 lb 6.2 oz (106.772 kg)] 235 lb 6.2 oz (106.772 kg) (08/01 0614) Last BM Date: 06/18/15  Weight change: Filed Weights   06/22/15 0555 06/22/15 0951 06/23/15 0614  Weight: 237 lb 14 oz (107.9 kg) 235 lb 9.6 oz (106.867 kg) 235 lb 6.2 oz (106.772 kg)    Intake/Output:   Intake/Output Summary (Last 24 hours) at 06/23/15 1123 Last data filed at 06/23/15 0845  Gross per 24 hour  Intake   1080 ml  Output    575 ml  Net    505 ml     Physical Exam: General:  Chronically ill appearing. No resp difficulty. In bed  HEENT: normal except R eye redness. Nose and scalp with dry scales Neck: supple. JVP does not appear elevated.   Carotids 2+ bilat; no bruits. No lymphadenopathy or thryomegaly appreciated. Cor: PMI laterally displaced. Iregular rate & rhythm. 2/6 MR Lungs: clear Abdomen: obese, soft, nontender, nondistended. No hepatosplenomegaly. No bruits or masses. Good bowel sounds. Extremities: no cyanosis, clubbing, rash, edema Neuro: alert & orientedx3, cranial nerves grossly intact. moves all 4 extremities w/o difficulty. Affect pleasant  Telemetry: A fib 60s    Labs: Basic Metabolic Panel:  Recent Labs Lab 06/18/15 1833 06/19/15 0404 06/20/15 0355 06/21/15 0300 06/22/15 0305  NA 135 138 137 135 135  K 3.6 3.1* 3.2* 3.7 3.6  CL 95* 96* 97* 98* 97*  CO2 '24 29 30 29 27  ' GLUCOSE 92 134* 106* 99 110*  BUN 72* 64* 52* 56* 50*  CREATININE 2.65* 2.35* 2.08* 2.15* 1.99*  CALCIUM 9.2 9.3 9.3 9.2 9.5  MG  --  2.1  --   --   --     Liver Function Tests:  Recent Labs Lab 06/19/15 0404  AST 30  ALT 22  ALKPHOS 63  BILITOT 0.9  PROT 7.6  ALBUMIN 3.8   No results for input(s): LIPASE, AMYLASE in the last 168 hours. No results for input(s): AMMONIA in the last 168 hours.  CBC:  Recent Labs Lab 06/18/15 1833 06/19/15 0404  WBC 6.3 7.3  NEUTROABS  --  4.6  HGB 11.6* 11.9*  HCT 36.3* 36.5*   MCV 86.6 85.7  PLT 193 212    Cardiac Enzymes: No results for input(s): CKTOTAL, CKMB, CKMBINDEX, TROPONINI in the last 168 hours.  BNP: BNP (last 3 results)  Recent Labs  11/23/14 1719 05/19/15 1229 06/18/15 1833  BNP 569.4* 343.6* 823.0*    ProBNP (last 3 results)  Recent Labs  08/08/14 1116 10/19/14 2158 10/25/14 1048  PROBNP 984.5* 1637.0* 3555.0*     CBG:  Recent Labs Lab 06/22/15 0552 06/22/15 1057 06/22/15 1644 06/22/15 2116 06/23/15 0647  GLUCAP 95 102* 112* 103* 111*    Coagulation Studies: No results for input(s): LABPROT, INR in the last 72 hours.  Other results:  Imaging:  No results found.   Medications:     Current Medications: . acyclovir  400 mg Oral BID  . allopurinol  100 mg Oral BID  . apixaban  5 mg Oral BID  . carvedilol  12.5 mg Oral BID  WC  . docusate sodium  100 mg Oral BID  . furosemide  60 mg Intravenous Q8H  . glipiZIDE  5 mg Oral BID WC  . hydrALAZINE  50 mg Oral 3 times per day  . insulin aspart  0-9 Units Subcutaneous TID WC  . isosorbide mononitrate  60 mg Oral Daily  . polyethylene glycol  17 g Oral Daily  . potassium chloride SA  20 mEq Oral Daily  . pregabalin  75 mg Oral BID  . selenium  200 mcg Oral Daily  . sodium chloride  3 mL Intravenous Q12H  . spironolactone  25 mg Oral Daily  . vitamin B-12  250 mcg Oral Daily     Infusions:      Assessment/Plan   1) Shingles- on acyclovir  - possibly ocular involvement 2)  Acute/ Chronic Systolic Heart Failure: nonischemic cardiomyopathy, EF 20% 1/16.  2) Gout: Continue allopurinol. Continue to follow with rheumatology.  3) Atrial fibrillation: Regular rhythm. Continue Eliquis. Recent CBC stable on 02/19/2015.  4) CKD stage III: Baseline 1.9-2.4. He has nephrology follow up 5) DM  Mr Krogh is 64 year old well known to the HF Team admitted with mild volume overload and shingles. He has been diuresed with IV lasix. From HF perspective he is stable.  Would stop IV lasix and resume home diuretic regimen. Continue home HF regimen.  Continue to follow renal function.   Main issue at this point seems to be shingles possibly involving R eye. On day 4/7 acyclovir. Concerned about R eye.    Length of Stay: 5  CLEGG,AMY NP-C  06/23/2015, 11:23 AM  Advanced Heart Failure Team Pager [phone] (M-F; [place])  Please contact [facility] for night-coverage after hours (4p -7a ) and weekends on [email or web address]  Patient seen and examined with Darrick Grinder, NP. We discussed all aspects of the encounter. I agree with the assessment and plan as stated above.   He looks good from HF perspective. Volume status ok. Would resume home HF meds. 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 Have discussed with Dr. Dyann Kief who will consult ophtho. Continue Eliquis for PAF.   Bensimhon, Daniel,MD 12:39 PM

## 2015-06-23 NOTE — Progress Notes (Signed)
[facility] PROGRESS NOTE  Interim History: 64 y.o. male with history of chronic systolic heart failure last EF measured in January 2016 was 20% presents to the ER because of increasing shortness of breath and weight gain. Patient denies any fever chills chest pain. Patient also has some symptoms of orthopnea. Chest x-ray does not show anything acute and on exam patient has lower extremity edema and elevated JVD. Patient states he has been compliant with his medications but still has gained 8 pounds from his baseline weight. Patient has been admitted for CHF exacerbation. In addition patient is noticed to have rash with the cycles on his scalp on the right side and around the nasal bridge. This has been present for over the last 1 week and has been painful.   Filed Weights   06/22/15 0555 06/22/15 0951 06/23/15 0614  Weight: 107.9 kg (237 lb 14 oz) 106.867 kg (235 lb 9.6 oz) 106.772 kg (235 lb 6.2 oz)        Intake/Output Summary (Last 24 hours) at 06/23/15 1839 Last data filed at 06/23/15 1800  Gross per 24 hour  Intake   1080 ml  Output      0 ml  Net   1080 ml     Assessment/Plan: 1-Acute on chronic systolic heart failure: neg troponin and no CP -patient has refused ICD -moving target for medication titration and diuretics adjustments -after discussing with HF team; recommendation was to resume home meds and follow BMET in am -will continue daily weights and strict intake and output -continue coreg, hydralazine, IMDUR and spironolactone -good diuresis -goal weight 231-232 pounds; has remained in 235  2-Facial rash: with 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 -will continue tx with acyclovir  -opthalmology service has seen patient; will follow rec's  3-Diabetes mellitus type 2, controlled and stable -will continue SSI and glipizide  4-Essential hypertension: stable and well controlled -will continue current antihypertensive regimen  -Patient  encouraged to follow a low-sodium diet at discharge  5-Chronic anemia: Hgb stable -no signs of overt bleeding  6-CKD (chronic kidney disease), stage III: stable and better that baseline -will continue monitoring renal function -especially while on IV diuretics -Improving; creatinine on 8/1 was 1.96  Code Status: Full Family Communication: no family at bedside Disposition Plan: home when breathing and extra fluid improved.   Consultants: HF team  Procedures: ECHO: last done in 11/26/14 - Left ventricle: The cavity size was normal. Wall thickness was normal. Systolic function was severely reduced. The estimated ejection fraction was 20%. LV apical false tendon. Severe global hypokinesis. The study is not technically sufficient to allow evaluation of LV diastolic function. - Left atrium: Severely dilated at 50 ml/m2. - Right atrium: Moderately dilated at 24 cm2.  Impressions: - Compared to the prior echo in 2014, there has been little if any improvement in EF to around 20%, severe global hypokinesis, biatrial enlargement.  Antibiotics: none   Acyclovir   HPI/Subjective: Feeling better and breathing easier. No CP. Denies orthopnea. Will switch to oral home regimen as recommended by HF team. Patient complaining of right eye dryness and blurred vision   Objective: Filed Vitals:   06/22/15 2214 06/23/15 0238 06/23/15 0614 06/23/15 1454  BP: 112/75 116/74 114/72 108/74  Pulse: 76 68 72 82  Temp: 98.3 F (36.8 C) 98.6 F (37 C) 98.3 F (36.8 C) 98.1 F (36.7 C)  TempSrc: Oral Oral Oral Oral  Resp: 18 18 18 18   Height:  Weight:   106.772 kg (235 lb 6.2 oz)   SpO2: 100% 100% 100% 100%     Exam:  General: Alert, awake, oriented x3, in no acute distress. Reports breathing is better and denies CP. Facial rash continue improving. Complaining of right eye dryness and blurred vision.  HEENT: No bruits, no goiter. Positive skin pustular rash extending from  scalp to right ear, much better and disappearing.  Heart: Regular rate and rhythm, no rubs, no gallops. Trace edema bilaterally  Lungs: Good air movement, fin crackles. Abdomen: Soft, nontender, nondistended, positive bowel sounds.  Neuro: Grossly intact, nonfocal.   Data Reviewed: Basic Metabolic Panel:  Recent Labs Lab 06/19/15 0404 06/20/15 0355 06/21/15 0300 06/22/15 0305 06/23/15 1246  NA 138 137 135 135 135  K 3.1* 3.2* 3.7 3.6 4.0  CL 96* 97* 98* 97* 97*  CO2 GLUCOSE 134* 106* 99 110* 97  BUN 64* 52* 56* 50* 44*  CREATININE 2.35* 2.08* 2.15* 1.99* 1.96*  CALCIUM 9.3 9.3 9.2 9.5 9.9  MG 2.1  --   --   --   --    Liver Function Tests:  Recent Labs Lab 06/19/15 0404  AST 30  ALT 22  ALKPHOS 63  BILITOT 0.9  PROT 7.6  ALBUMIN 3.8   CBC:  Recent Labs Lab 06/18/15 1833 06/19/15 0404  WBC 6.3 7.3  NEUTROABS  --  4.6  HGB 11.6* 11.9*  HCT 36.3* 36.5*  MCV 86.6 85.7  PLT 193 212   BNP (last 3 results)  Recent Labs  11/23/14 1719 05/19/15 1229 06/18/15 1833  BNP 569.4* 343.6* 823.0*    ProBNP (last 3 results)  Recent Labs  08/08/14 1116 10/19/14 2158 10/25/14 1048  PROBNP 984.5* 1637.0* 3555.0*    CBG:  Recent Labs Lab 06/22/15 1644 06/22/15 2116 06/23/15 0647 06/23/15 1057 06/23/15 1632  GLUCAP 112* 103* 111* 143* 91    Recent Results (from the past 240 hour(s))  MRSA PCR Screening     Status: None   Collection Time: 06/19/15 11:57 AM  Result Value Ref Range Status   MRSA by PCR NEGATIVE NEGATIVE Final    Comment:        The GeneXpert MRSA Assay (FDA approved for NASAL specimens only), is one component of a comprehensive MRSA colonization surveillance program. It is not intended to diagnose MRSA infection nor to guide or monitor treatment for MRSA infections.      Studies: No results found.  Scheduled Meds: . acyclovir  800 mg Oral 5 X Daily  . allopurinol  100 mg Oral BID  . apixaban  5 mg Oral  BID  . carvedilol  12.5 mg Oral BID WC  . docusate sodium  100 mg Oral BID  . glipiZIDE  5 mg Oral BID WC  . hydrALAZINE  50 mg Oral 3 times per day  . insulin aspart  0-9 Units Subcutaneous TID WC  . isosorbide mononitrate  60 mg Oral Daily  . polyethylene glycol  17 g Oral Daily  . potassium chloride SA  20 mEq Oral Daily  . prednisoLONE acetate  1 drop Right Eye QID  . pregabalin  75 mg Oral BID  . selenium  200 mcg Oral Daily  . sodium chloride  3 mL Intravenous Q12H  . [START ON 06/24/2015] torsemide  40 mg Oral BID  . vitamin B-12  250 mcg Oral Daily   Continuous Infusions:   Time: 30 minutes  Vassie Loll  [facility] Pager [phone] If 7PM-7AM, please contact night-coverage at [email or web address], password [facility] 06/23/2015, 6:39 PM  LOS: 5 days

## 2015-06-24 DIAGNOSIS — N183 Chronic kidney disease, stage 3 (moderate): Secondary | ICD-10-CM

## 2015-06-24 LAB — BASIC METABOLIC PANEL
Anion gap: 8 (ref 5–15)
BUN: 43 mg/dL — ABNORMAL HIGH (ref 6–20)
CO2: 26 mmol/L (ref 22–32)
CREATININE: 2.03 mg/dL — AB (ref 0.61–1.24)
Calcium: 9.6 mg/dL (ref 8.9–10.3)
Chloride: 98 mmol/L — ABNORMAL LOW (ref 101–111)
GFR, EST AFRICAN AMERICAN: 38 mL/min — AB (ref >60)
GFR, EST NON AFRICAN AMERICAN: 33 mL/min — AB (ref >60)
Glucose, Bld: 85 mg/dL (ref 65–99)
Potassium: 4.4 mmol/L (ref 3.5–5.1)
Sodium: 132 mmol/L — ABNORMAL LOW (ref 135–145)

## 2015-06-24 LAB — GLUCOSE, CAPILLARY
GLUCOSE-CAPILLARY: 99 mg/dL (ref 65–99)
Glucose-Capillary: 118 mg/dL — ABNORMAL HIGH (ref 65–99)

## 2015-06-24 MED ORDER — VALACYCLOVIR HCL 1 G PO TABS: 1000.0000 mg | ORAL_TABLET | Freq: Three times a day (TID) | ORAL | Status: DC

## 2015-06-24 MED ORDER — VALACYCLOVIR HCL 500 MG PO TABS
1000.0000 mg | ORAL_TABLET | Freq: Three times a day (TID) | ORAL | Status: DC
  Administered 2015-06-24: 1000 mg via ORAL
  Filled 2015-06-24 (×2): qty 2

## 2015-06-24 MED ORDER — PREDNISOLONE ACETATE 1 % OP SUSP: 1.0000 [drp] | Freq: Four times a day (QID) | OPHTHALMIC | Status: DC

## 2015-06-24 NOTE — Care Management Important Message (Signed)
Important Message  Patient Details  Name: Frank Davidson MRN: [id number] Date of Birth: 02-13-1951   Medicare Important Message Given:  Yes-second notification given    [name], RN 06/24/2015, 2:51 PM

## 2015-06-24 NOTE — Progress Notes (Signed)
Advanced Heart Failure Team Rounding Note  Referring Physician: Dr Gwenlyn Perking  Primary Physician: Dr Patsy Lager  Primary HF: Dr Gala Romney   Reason for Consultation: Heart Failure   HPI:    Denies SOB. + eye pain. Seen by ophtho. Weight stable. Creatinine stable at 2.0  Medications:  . acyclovir  800 mg Oral 5 X Daily  . allopurinol  100 mg Oral BID  . apixaban  5 mg Oral BID  . carvedilol  12.5 mg Oral BID WC  . docusate sodium  100 mg Oral BID  . glipiZIDE  5 mg Oral BID WC  . hydrALAZINE  50 mg Oral 3 times per day  . insulin aspart  0-9 Units Subcutaneous TID WC  . isosorbide mononitrate  60 mg Oral Daily  . polyethylene glycol  17 g Oral Daily  . potassium chloride SA  20 mEq Oral Daily  . prednisoLONE acetate  1 drop Right Eye QID  . pregabalin  75 mg Oral BID  . selenium  200 mcg Oral Daily  . sodium chloride  3 mL Intravenous Q12H  . torsemide  40 mg Oral BID  . vitamin B-12  250 mcg Oral Daily     Objective:    Vital Signs:   Temp:  [97.5 F (36.4 C)-98.7 F (37.1 C)] 97.5 F (36.4 C) (08/02 0512) Pulse Rate:  [70-82] 75 (08/02 0512) Resp:  [18-20] 18 (08/02 0512) BP: (108-112)/(69-82) 108/69 mmHg (08/02 0512) SpO2:  [99 %-100 %] 100 % (08/02 0512) Weight:  [106.7 kg (235 lb 3.7 oz)] 106.7 kg (235 lb 3.7 oz) (08/02 0512) Last BM Date: 06/22/15  Weight change: Filed Weights   06/22/15 0951 06/23/15 0614 06/24/15 0512  Weight: 106.867 kg (235 lb 9.6 oz) 106.772 kg (235 lb 6.2 oz) 106.7 kg (235 lb 3.7 oz)    Intake/Output:   Intake/Output Summary (Last 24 hours) at 06/24/15 1028 Last data filed at 06/24/15 0935  Gross per 24 hour  Intake   1260 ml  Output      0 ml  Net   1260 ml     Physical Exam: General:  Chronically ill appearing. No resp difficulty. In bed  HEENT: normal except R eye redness. Nose and scalp with dry scales Neck: supple. JVP does not appear elevated.   Carotids 2+ bilat; no bruits. No lymphadenopathy or thryomegaly  appreciated. Cor: PMI laterally displaced. Iregular rate & rhythm. 2/6 MR Lungs: clear Abdomen: obese, soft, nontender, nondistended. No hepatosplenomegaly. No bruits or masses. Good bowel sounds. Extremities: no cyanosis, clubbing, rash, edema Neuro: alert & orientedx3, cranial nerves grossly intact. moves all 4 extremities w/o difficulty. Affect pleasant  Telemetry: A fib 60s    Labs: Basic Metabolic Panel:  Recent Labs Lab 06/19/15 0404 06/20/15 0355 06/21/15 0300 06/22/15 0305 06/23/15 1246 06/24/15 0414  NA 138 137 135 135 135 132*  K 3.1* 3.2* 3.7 3.6 4.0 4.4  CL 96* 97* 98* 97* 97* 98*  CO2 GLUCOSE 134* 106* 99 110* 97 85  BUN 64* 52* 56* 50* 44* 43*  CREATININE 2.35* 2.08* 2.15* 1.99* 1.96* 2.03*  CALCIUM 9.3 9.3 9.2 9.5 9.9 9.6  MG 2.1  --   --   --   --   --     Liver Function Tests:  Recent Labs Lab 06/19/15 0404  AST 30  ALT 22  ALKPHOS 63  BILITOT 0.9  PROT 7.6  ALBUMIN 3.8   No results for  input(s): LIPASE, AMYLASE in the last 168 hours. No results for input(s): AMMONIA in the last 168 hours.  CBC:  Recent Labs Lab 06/18/15 1833 06/19/15 0404  WBC 6.3 7.3  NEUTROABS  --  4.6  HGB 11.6* 11.9*  HCT 36.3* 36.5*  MCV 86.6 85.7  PLT 193 212    Cardiac Enzymes: No results for input(s): CKTOTAL, CKMB, CKMBINDEX, TROPONINI in the last 168 hours.  BNP: BNP (last 3 results)  Recent Labs  11/23/14 1719 05/19/15 1229 06/18/15 1833  BNP 569.4* 343.6* 823.0*    ProBNP (last 3 results)  Recent Labs  08/08/14 1116 10/19/14 2158 10/25/14 1048  PROBNP 984.5* 1637.0* 3555.0*     CBG:  Recent Labs Lab 06/23/15 0647 06/23/15 1057 06/23/15 1632 06/23/15 2119 06/24/15 0620  GLUCAP 111* 143* 91 113* 99    Coagulation Studies: No results for input(s): LABPROT, INR in the last 72 hours.  Other results:  Imaging: No results found.   Medications:     Current Medications: . acyclovir  800 mg Oral 5 X  Daily  . allopurinol  100 mg Oral BID  . apixaban  5 mg Oral BID  . carvedilol  12.5 mg Oral BID WC  . docusate sodium  100 mg Oral BID  . glipiZIDE  5 mg Oral BID WC  . hydrALAZINE  50 mg Oral 3 times per day  . insulin aspart  0-9 Units Subcutaneous TID WC  . isosorbide mononitrate  60 mg Oral Daily  . polyethylene glycol  17 g Oral Daily  . potassium chloride SA  20 mEq Oral Daily  . prednisoLONE acetate  1 drop Right Eye QID  . pregabalin  75 mg Oral BID  . selenium  200 mcg Oral Daily  . sodium chloride  3 mL Intravenous Q12H  . torsemide  40 mg Oral BID  . vitamin B-12  250 mcg Oral Daily    Infusions:     Assessment/Plan   1) Shingles- on acyclovir  - possibly ocular involvement 2)  Acute/ Chronic Systolic Heart Failure: nonischemic cardiomyopathy, EF 20% 1/16.  2) Gout: Continue allopurinol. Continue to follow with rheumatology.  3) Atrial fibrillation: Regular rhythm. Continue Eliquis. Recent CBC stable on 02/19/2015.  4) CKD stage III: Baseline 1.9-2.4. He has nephrology follow up 5) DM  He looks good from HF perspective. Volume status ok. Would continue home HF meds. Continue Eliquis for PAF.   Ophtho following.We will sign off. Please call us with questions.   Bensimhon, Daniel,MD 10:28 AM Advanced Heart Failure Team Pager [phone] (M-F; 7a - 4p)  Please contact [facility] for night-coverage after hours (4p -7a ) and weekends on [email or web address]

## 2015-06-24 NOTE — Progress Notes (Signed)
1420 discharge instructions and prescriptions given .Vebalizeduinderstanding. Escorted to lobby by NT by wheelchair

## 2015-06-24 NOTE — Consult Note (Signed)
   [facility] Inpatient Consult   06/24/2015  Frank Davidson August 01, 1951 [id number] Patient was discussed in [place].  Patient was previously active with [facility] and was managing fairly well. Admitted currently with HF exacerbation with difficulty getting back to baseline weight.  Spoke with the patient and states that he seems to be at 235 pounds the last couple of days.  He states he has shingles and may have some difficulty paying for his new prescription depending on cost.  Patient previously signed a consent.  Information reviewed patient's sister Isabell Jarvis Odem] has an updated phone number of [phone]. He will be with his niece, Designer, industrial/product. Patient endorses that his primary care provider is Jessica Copland.   Patient was assessed for [facility] for community services. Patient was previously active with [facility].  Met with patient at bedside regarding being restarted with [facility] services. Consent form signed and folder with [facility] information given.  Of note, [facility] Care Management services does not replace or interfere with any services that are arranged by inpatient case management or social work. For additional questions or referrals please contact: Natividad Brood, RN BSN [facility] Liaison  [phone] business mobile phone

## 2015-06-24 NOTE — Discharge Summary (Signed)
Physician Discharge Summary  Frank Davidson BSW:[id number] DOB: 1950-11-23 DOA: 06/18/2015  PCP: Frank Blinks, MD  Admit date: 06/18/2015 Discharge date: 06/24/2015  Time spent: >30 minutes  Recommendations for Outpatient Follow-up:  1. Check BMET to follow renal function and electrolytes 2. Reassess BP and adjust medications if needed   Discharge Diagnoses:  Principal Problem:   Acute on chronic systolic heart failure Active Problems:   CKD (chronic kidney disease), stage III   Facial rash   Diabetes mellitus type 2, controlled   Essential hypertension   Chronic anemia   CHF (congestive heart failure)   Herpes ocular   Discharge Condition: stable and improved. Will discharge home. Follow up with ophthalmologist and with Dr. Aundra Davidson at HF clinic as instructed.   Diet recommendation: low sodium diet  Filed Weights   06/22/15 0951 06/23/15 0614 06/24/15 0512  Weight: 106.867 kg (235 lb 9.6 oz) 106.772 kg (235 lb 6.2 oz) 106.7 kg (235 lb 3.7 oz)    History of present illness:  64 y.o. male with history of chronic systolic heart failure last EF measured in January 2016 was 20% presents to the ER because of increasing shortness of breath and weight gain. Patient denies any fever chills chest pain. Patient also has some symptoms of orthopnea. Chest x-ray does not show anything acute and on exam patient has lower extremity edema and elevated JVD. Patient states he has been compliant with his medications but still has gained 8 pounds from his baseline weight. Patient has been admitted for CHF exacerbation. In addition patient is noticed to have rash with the cycles on his scalp on the right side and around the nasal bridge. This has been present for over the last 1 week and has been painful. Patient admitted for acute on chronic heart failure.  Hospital Course:  1-Acute on chronic systolic heart failure: neg troponin and no CP -patient has refused ICD again -will follow with HF team  on 07/11/15 -will continue daily weights and low sodium diet -continue coreg, hydralazine, IMDUR and spironolactone -maintaining good diuresis with oral regimen -goal weight 231-232 pounds; has remained in 235; but completely asymptomatic  2-herpes zoster and herpes opthalmicus -will continue tx with acyclovir  -opthalmology service has seen patient; and recommended use of Pred-Forte suspension (1 drop 4 X daily) -follow up in 1 week with him  3-Diabetes mellitus type 2, controlled and stable -will continue SSI and glipizide  4-Essential hypertension: stable and well controlled -will continue current antihypertensive regimen  -Patient encouraged to follow a low-sodium diet at discharge  5-Chronic anemia: Hgb stable -no signs of overt bleeding  6-CKD (chronic kidney disease), stage III: overall stable  -will recommend close follow up of his renal function  -Improving; creatinine on 8/2 2.03  Procedures: ECHO: last done in 11/26/14 - Left ventricle: The cavity size was normal. Wall thickness was normal. Systolic function was severely reduced. The estimated ejection fraction was 20%. LV apical false tendon. Severe global hypokinesis. The study is not technically sufficient to allow evaluation of LV diastolic function. - Left atrium: Severely dilated at 50 ml/m2. - Right atrium: Moderately dilated at 24 cm2.  Impressions: - Compared to the prior echo in 2014, there has been little if any improvement in EF to around 20%, severe global hypokinesis, biatrial enlargement.  Consultations:  Ophthalmology  Cardiology    Discharge Exam: Filed Vitals:   06/24/15 0512  BP: 108/69  Pulse: 75  Temp: 97.5 F (36.4 C)  Resp: 18   General:  Alert, awake, oriented x3, in no acute distress. Reports breathing is better and denies CP. Facial pustular-rash continue improving. Reports right eye feels better, but still with some dryness and blurred vision.  HEENT: No bruits,  no goiter. Positive skin pustular rash extending from scalp to right ear, much better and disappearing.  Heart: Regular rate and rhythm, no rubs, no gallops. No edema bilaterally  Lungs: Good air movement, no frank crackles. Abdomen: Soft, nontender, nondistended, positive bowel sounds.  Neuro: Grossly intact, nonfocal.   Discharge Instructions   Discharge Instructions    Diet - low sodium heart healthy    Complete by:  As directed      Discharge instructions    Complete by:  As directed   Low sodium diet (less than 2 gram daily) Take medications as prescribed Follow with Dr. Valetta Close in 1 week (ophthalmologist) Follow with HF clinic as instructed Check weight on daily basis (contact HF clinic with increase of > 3 pounds overnight and/or > 5 pounds in a week)          Current Discharge Medication List    START taking these medications   Details  prednisoLONE acetate (PRED FORTE) 1 % ophthalmic suspension Place 1 drop into the right eye 4 (four) times daily. Qty: 5 mL, Refills: 0    valACYclovir (VALTREX) 1000 MG tablet Take 1 tablet (1,000 mg total) by mouth 3 (three) times daily. Qty: 42 tablet, Refills: 0      CONTINUE these medications which have NOT CHANGED   Details  acetaminophen (TYLENOL) 500 MG tablet Take 500 mg by mouth every 6 (six) hours as needed for moderate pain or headache.    albuterol (PROVENTIL HFA;VENTOLIN HFA) 108 (90 BASE) MCG/ACT inhaler Inhale 2 puffs into the lungs every 6 (six) hours as needed for wheezing or shortness of breath. Qty: 1 Inhaler, Refills: 1   Associated Diagnoses: Wheezing    allopurinol (ZYLOPRIM) 100 MG tablet Take 1 tablet (100 mg total) by mouth daily with breakfast. Qty: 90 tablet, Refills: 3   Associated Diagnoses: Idiopathic gout, unspecified chronicity, unspecified site    apixaban (ELIQUIS) 5 MG TABS tablet Take 1 tablet (5 mg total) by mouth 2 (two) times daily. Qty: 180 tablet, Refills: 3    baclofen (LIORESAL) 10  MG tablet TAKE 1 TABLET EVERY 8 HOURS AS NEEDED FOR MUSCLE SPASM(S) Qty: 90 tablet, Refills: 1    carvedilol (COREG) 25 MG tablet TAKE 1/2 TABLET TWICE DAILY  WITH  A  MEAL Qty: 90 tablet, Refills: 2    diclofenac (FLECTOR) 1.3 % PTCH Place 1 patch onto the skin 2 (two) times daily as needed (pain).    docusate sodium 100 MG CAPS Take 100 mg by mouth 2 (two) times daily. Qty: 10 capsule, Refills: 0    glipiZIDE (GLUCOTROL) 5 MG tablet Take 1 tablet (5 mg total) by mouth 2 (two) times daily. Qty: 180 tablet, Refills: 3   Associated Diagnoses: DM (diabetes mellitus), type 2, uncontrolled, with renal complications    Hawthorne Berry 550 MG CAPS Take 1,100 mg by mouth 2 (two) times daily.    hydrALAZINE (APRESOLINE) 50 MG tablet Take 1 tablet (50 mg total) by mouth 3 (three) times daily. Qty: 270 tablet, Refills: 2    isosorbide mononitrate (IMDUR) 60 MG 24 hr tablet TAKE 1 TABLET (60 MG TOTAL) BY MOUTH DAILY. Qty: 90 tablet, Refills: 2    meloxicam (MOBIC) 7.5 MG tablet Take 1 tablet (7.5 mg total) by mouth daily.  Qty: 90 tablet, Refills: 1   Associated Diagnoses: Hip pain, acute, right    metolazone (ZAROXOLYN) 5 MG tablet Take 1 tablet (5 mg total) by mouth daily as needed (Swelling). Qty: 30 tablet, Refills: 2    Misc Natural Products (TURMERIC CURCUMIN) CAPS Take 1 capsule by mouth 3 (three) times daily.    oxyCODONE-acetaminophen (PERCOCET) 10-325 MG per tablet Take 1 tablet by mouth 4 (four) times daily. scheduled    polyethylene glycol powder (GLYCOLAX/MIRALAX) powder MIX 1 CAPFUL (17 GM) IN LIQUID AND DRINK EVERY DAY AS DIRECTED Qty: 1700 g, Refills: 2   Associated Diagnoses: Constipation, unspecified constipation type    pregabalin (LYRICA) 75 MG capsule Take 1 capsule (75 mg total) by mouth daily. Qty: 90 capsule, Refills: 3    selenium 50 MCG TABS tablet Take 200 mcg by mouth daily.    spironolactone (ALDACTONE) 25 MG tablet Take 1 tablet (25 mg total) by mouth  daily. Qty: 90 tablet, Refills: 3    torsemide (DEMADEX) 20 MG tablet Take 2 tablets (40 mg total) by mouth 2 (two) times daily. Qty: 360 tablet, Refills: 2    vitamin B-12 (CYANOCOBALAMIN) 250 MCG tablet Take 250 mcg by mouth daily.    ACCU-CHEK FASTCLIX LANCETS MISC Test blood sugar once daily. Dx code: E11.8 Qty: 100 each, Refills: 3   Associated Diagnoses: DM (diabetes mellitus), type 2, uncontrolled, with renal complications    Alcohol Swabs (B-D SINGLE USE SWABS REGULAR) PADS Test blood sugar daily. Dx code: E11.8 Qty: 100 each, Refills: 3   Associated Diagnoses: DM (diabetes mellitus), type 2, uncontrolled, with renal complications    Blood Glucose Calibration (ACCU-CHEK SMARTVIEW CONTROL) LIQD Test blood sugar once daily. Dx code: E11.8. Qty: 1 each, Refills: 3   Associated Diagnoses: DM (diabetes mellitus), type 2, uncontrolled, with renal complications    Blood Glucose Monitoring Suppl (ACCU-CHEK NANO SMARTVIEW) W/DEVICE KIT Test blood sugar daily. Dx code: E11.8 Qty: 1 kit, Refills: 0   Associated Diagnoses: DM (diabetes mellitus), type 2, uncontrolled, with renal complications    colchicine 0.6 MG tablet Take 1.2 mg by mouth daily as needed (Gout). Colcrys    glucose blood (ACCU-CHEK SMARTVIEW) test strip Test blood sugar once daily. Dx code: E11.8 Qty: 100 each, Refills: 3   Associated Diagnoses: DM (diabetes mellitus), type 2, uncontrolled, with renal complications    potassium chloride SA (K-DUR,KLOR-CON) 20 MEQ tablet Take 1 tablet (20 mEq total) by mouth daily. Qty: 30 tablet, Refills: 0   Associated Diagnoses: Hypokalemia       Allergies  Allergen Reactions  . Corlanor [Ivabradine] Palpitations and Other (See Comments)    Kidney and heart problems, syncope  . Januvia [Sitagliptin] Other (See Comments)    "Almost killed me"   . Lunesta [Eszopiclone] Palpitations and Other (See Comments)    Kidney and heart problems, syncope  . Actos [Pioglitazone] Other  (See Comments)    Caused blood in urine and fluid retention    Follow-up Information    Follow up with Loralie Champagne, MD. Go on 07/11/2015.   Specialty:  Cardiology   Why:  at 1200 noon in [facility]--gate code 0008--please bring all medications to appt   Contact information:   [address] [place] [address] [phone]       The results of significant diagnostics from this hospitalization (including imaging, microbiology, ancillary and laboratory) are listed below for reference.    Significant Diagnostic Studies: Dg Chest 2 View  06/18/2015   CLINICAL DATA:  Dyspnea.  EXAM: CHEST  2 VIEW  COMPARISON:  02/19/2015  FINDINGS: There is hyperinflation and marked cardiomegaly. There is mild aortic tortuosity. There is mild linear scarring in the mid lung are bilaterally. No confluent airspace consolidation. No large effusion. Pulmonary vasculature is normal.  IMPRESSION: Marked cardiomegaly.  Hyperinflation.  Stable linear scarring.   Electronically Signed   By: Andreas Newport M.D.   On: 06/18/2015 18:09    Microbiology: Recent Results (from the past 240 hour(s))  MRSA PCR Screening     Status: None   Collection Time: 06/19/15 11:57 AM  Result Value Ref Range Status   MRSA by PCR NEGATIVE NEGATIVE Final    Comment:        The GeneXpert MRSA Assay (FDA approved for NASAL specimens only), is one component of a comprehensive MRSA colonization surveillance program. It is not intended to diagnose MRSA infection nor to guide or monitor treatment for MRSA infections.      Labs: Basic Metabolic Panel:  Recent Labs Lab 06/19/15 0404 06/20/15 0355 06/21/15 0300 06/22/15 0305 06/23/15 1246 06/24/15 0414  NA 138 137 135 135 135 132*  K 3.1* 3.2* 3.7 3.6 4.0 4.4  CL 96* 97* 98* 97* 97* 98*  CO2 '29 30 29 27 27 26  ' GLUCOSE 134* 106* 99 110* 97 85  BUN 64* 52* 56* 50* 44* 43*  CREATININE 2.35* 2.08* 2.15* 1.99* 1.96* 2.03*  CALCIUM  9.3 9.3 9.2 9.5 9.9 9.6  MG 2.1  --   --   --   --   --    Liver Function Tests:  Recent Labs Lab 06/19/15 0404  AST 30  ALT 22  ALKPHOS 63  BILITOT 0.9  PROT 7.6  ALBUMIN 3.8   CBC:  Recent Labs Lab 06/18/15 1833 06/19/15 0404  WBC 6.3 7.3  NEUTROABS  --  4.6  HGB 11.6* 11.9*  HCT 36.3* 36.5*  MCV 86.6 85.7  PLT 193 212   BNP (last 3 results)  Recent Labs  11/23/14 1719 05/19/15 1229 06/18/15 1833  BNP 569.4* 343.6* 823.0*    ProBNP (last 3 results)  Recent Labs  08/08/14 1116 10/19/14 2158 10/25/14 1048  PROBNP 984.5* 1637.0* 3555.0*    CBG:  Recent Labs Lab 06/23/15 1057 06/23/15 1632 06/23/15 2119 06/24/15 0620 06/24/15 1115  GLUCAP 143* 91 113* 99 118*    Signed:  Barton Dubois  [facility] 06/24/2015, 1:36 PM

## 2015-06-24 NOTE — Progress Notes (Signed)
The patient complained about eye pain and a headache overnight, in which he received pain medication for.  He did not use his urinal and was reeducated on the importance of doing so.

## 2015-06-24 NOTE — Care Management Note (Addendum)
Case Management Note  Patient Details  Name: Frank Davidson MRN: [id number] Date of Birth: 03-23-1951  Subjective/Objective:       Shingles, CHF             Action/Plan: Home  Expected Discharge Date:  06/24/2015              Expected Discharge Plan:  Home/Self Care  In-House Referral:     Discharge planning Services  CM Consult    Status of Service:  Completed, signed off  Medicare Important Message Given:    Date Medicare IM Given:    Medicare IM give by:    Date Additional Medicare IM Given:    Additional Medicare Important Message give by:     If discussed at Long Length of Stay Meetings, dates discussed:    Additional Comments: NCM spoke to pt and gave price for Valtrex copay $32.80. Pt states he has Eliquis at home and is provided to him through the manufacture. Pt states he will receives his disability check on tomorrow to cover his medications.   ANDREA @ [employer] # [phone]   VALTREX 1000 MG TID X 10 DAY 30 PILLS  NOT COVER   VALNCYLOVIR 500 MG TID X 10 DAYS , 60 PILLS   COVER- YES  CO-PAY- $ 32.80 90 DAY FOR 30 DAYS  TIER- 3 DRUG  PRIOR APPROVAL - NO  PHARMACY - ANY RETAIL  [name], RN 06/24/2015, 12:28 PM

## 2015-06-25 ENCOUNTER — Other Ambulatory Visit: Payer: Self-pay | Admitting: *Deleted

## 2015-06-25 NOTE — Patient Outreach (Signed)
[facility]) Care Management  06/25/2015  [name] 09-06-1951 [id number]   Referral from Charlesetta Shanks, RN, assigned [name], RN to outreach.  [name]. [name] [facility] Care Management [facility] CM Assistant Phone: [phone] Fax: [phone]

## 2015-06-25 NOTE — Patient Outreach (Signed)
[facility]) Care Management  06/25/2015  Frank Davidson 08/12/1951 [id number]   Initial attempt for transition of care   RN attempted outreach call pt however pt not available and RN unable to leave a voice message. Will continue outreach calls accordingly.  [name], RN Care Management Coordinator [facility] Main Office [phone]

## 2015-06-27 ENCOUNTER — Other Ambulatory Visit: Payer: Self-pay | Admitting: *Deleted

## 2015-06-27 NOTE — Patient Outreach (Signed)
[facility]) Care Management  06/27/2015  MAKEL ELTING 02-Oct-1951 [id number]  Second attempt to reach pt post-op hospital discharge on 06/24/2015 however unsuccessful and RN unable to leave a message (mail box was full). Will continue outreach attempts for [facility] services.   [name], RN Care Management Coordinator [facility] Main Office [phone]

## 2015-06-30 ENCOUNTER — Encounter (HOSPITAL_COMMUNITY): Payer: Medicare HMO

## 2015-06-30 ENCOUNTER — Other Ambulatory Visit: Payer: Self-pay | Admitting: *Deleted

## 2015-06-30 ENCOUNTER — Ambulatory Visit (INDEPENDENT_AMBULATORY_CARE_PROVIDER_SITE_OTHER): Payer: Commercial Managed Care - HMO | Admitting: Family Medicine

## 2015-06-30 ENCOUNTER — Ambulatory Visit (INDEPENDENT_AMBULATORY_CARE_PROVIDER_SITE_OTHER): Payer: Commercial Managed Care - HMO

## 2015-06-30 VITALS — BP 130/80 | HR 65 | Temp 98.0°F | Resp 17 | Ht 68.0 in | Wt 244.0 lb

## 2015-06-30 DIAGNOSIS — R0602 Shortness of breath: Secondary | ICD-10-CM

## 2015-06-30 DIAGNOSIS — R062 Wheezing: Secondary | ICD-10-CM | POA: Diagnosis not present

## 2015-06-30 DIAGNOSIS — E876 Hypokalemia: Secondary | ICD-10-CM | POA: Diagnosis not present

## 2015-06-30 DIAGNOSIS — I5023 Acute on chronic systolic (congestive) heart failure: Secondary | ICD-10-CM | POA: Diagnosis not present

## 2015-06-30 LAB — BASIC METABOLIC PANEL
BUN: 82 mg/dL — ABNORMAL HIGH (ref 7–25)
CO2: 21 mmol/L (ref 20–31)
Calcium: 9.5 mg/dL (ref 8.6–10.3)
Chloride: 95 mmol/L — ABNORMAL LOW (ref 98–110)
Creat: 4.61 mg/dL — ABNORMAL HIGH (ref 0.70–1.25)
Glucose, Bld: 88 mg/dL (ref 65–99)
Potassium: 4.1 mmol/L (ref 3.5–5.3)
SODIUM: 133 mmol/L — AB (ref 135–146)

## 2015-06-30 MED ORDER — ALBUTEROL SULFATE HFA 108 (90 BASE) MCG/ACT IN AERS: 2.0000 | INHALATION_SPRAY | Freq: Four times a day (QID) | RESPIRATORY_TRACT | Status: AC | PRN

## 2015-06-30 MED ORDER — TORSEMIDE 20 MG PO TABS: 40.0000 mg | ORAL_TABLET | Freq: Two times a day (BID) | ORAL | Status: DC

## 2015-06-30 MED ORDER — POTASSIUM CHLORIDE CRYS ER 20 MEQ PO TBCR: 20.0000 meq | EXTENDED_RELEASE_TABLET | Freq: Every day | ORAL | Status: DC

## 2015-06-30 NOTE — Patient Instructions (Addendum)
You have an appt tomorrow to see the eye doctor- 3:15 pm on 07/01/15.  Please be sure to make this appt!   [facility] ?  Address: [address] [place], [place], [place] [address]  Phone: [phone]   It appears that your heart failure has gotten worse again- this is why your breathing is worse  Please increase your toresemide to 80 mg twice a day (this will be 4 pills twice a day) Continue the metolazone for the next 2-3 days Please increase your potasium to 20 meq twice a day Please come and see me on Wednesday for a recheck- 8- 4pm If you are getting worse in the meantime please come back and see [place] or even better contact your heart failure clinic  Use your inhaler if needed for your breathing- however I think the main problem is the fluid in your lungs

## 2015-06-30 NOTE — Progress Notes (Signed)
[facility] [address], [place] [address] [phone]  Date:  06/30/2015   Name:  Frank Davidson   DOB:  08-Sep-1951   MRN:  [id number]  PCP:  [name], MD    Chief Complaint: Shortness of Breath and patient states he has the shingles   History of Present Illness:  Frank Davidson is a 64 y.o. very pleasant male patient who presents with the following:  Here today to follow-up a recent hospital stay- he was admitted from 7/27 to 8/3 with acute on chronic systolic CHF.  He presented with weight gain and SOB.  He was up about 8 lbs on admission.  He was also noted to have shingles on the right side of his face and herpes opthalmicus.  Most recent EF 20%.  He states that his weight went back up to 242 within a couple of days of being released to home. He considers his dry weight to be 225 to 230 lbs.    He notes some increased wheezing, which is why he came in to see me today.  Has felt more SOB rececntly as well He uses one pillow generally- maybe 2 when he is more sick.  He has been taking his metolazone for 2 days- he does this PRN for exacerbations of his CHF He states that he missed his appt with his cardiologist this am because he lost the paper with appt details  He plans to see the eye doctor "next week."  His eye feels ok, vision is ok. Shingles seems to be healing. He does not have an optho appt yet  Wt Readings from Last 3 Encounters:  06/30/15 244 lb (110.678 kg)  06/24/15 235 lb 3.7 oz (106.7 kg)  06/18/15 243 lb (110.224 kg)   Per discharge summary, issues for today's visit include BMP and following up on his BP  BP Readings from Last 3 Encounters:  06/30/15 130/80  06/24/15 108/69  06/18/15 128/80     Patient Active Problem List   Diagnosis Date Noted  . Herpes ocular   . Acute on chronic systolic heart failure 91/79/1505  . Facial rash 06/19/2015  . Diabetes mellitus type 2, controlled 06/19/2015  . Essential hypertension  06/19/2015  . Chronic anemia 06/19/2015  . CHF (congestive heart failure) 06/19/2015  . CHF exacerbation 06/18/2015  . Non-ischemic cardiomyopathy- EF 15% echo Aug 2015 11/24/2014  . Chronic anticoagulation 11/24/2014  . S/P left THA, AA 09/12/2014  . Gout 04/29/2014  . Ocular herpes   . Neuropathy   . OSA (obstructive sleep apnea) 07/09/2013  . Chronic systolic heart failure 69/79/4801  . 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 PAF (paroxysmal atrial fibrillation) 07/03/2013  . Hypertension   . COPD (chronic obstructive pulmonary disease)   . Diabetes mellitus with nephropathy   . CKD (chronic kidney disease), stage III   . Polysubstance abuse     Past Medical History  Diagnosis Date  . Atrial fibrillation     PAF  . Hypertension   . COPD (chronic obstructive pulmonary disease)   . Ocular herpes   . Gout   . CHF (congestive heart failure)   . Chronic pain   . Hyperlipidemia   . Nonischemic cardiomyopathy Aug 2015    EF 15%  . Ocular herpes   . Neuropathy   . Left-sided weakness     "because of the arthritis and edema"  . PVD (peripheral vascular disease) May 2015  abnormal ABIs  . Memory impairment     "short term"  . CKD (chronic kidney disease), stage III     Dr. Mercy Moore follows-holding   . Fibromyalgia     neuropathy- hands, more than feet.  . Sleep apnea with use of continuous positive airway pressure (CPAP)     does not use cpap  . GERD (gastroesophageal reflux disease)     tums as needed  . Asthma   . Type II diabetes mellitus   . Stroke X 5    last stroke aug 2014"; denies residual on 09/12/2014  . Osteoarthritis   . Arthritis     "knees, right elbow, shoulder" (09/12/2014)    Past Surgical History  Procedure Laterality Date  . 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 Tonsillectomy  age 20  . Esophagogastroduodenoscopy N/A 12/24/2013    Procedure: ESOPHAGOGASTRODUODENOSCOPY (EGD);  Surgeon: Inda Castle, MD;  Location: [facility];  Service:  Endoscopy;  Laterality: N/A;  . Colonoscopy N/A 12/24/2013    Procedure: COLONOSCOPY;  Surgeon: Inda Castle, MD;  Location: [facility];  Service: Endoscopy;  Laterality: N/A;  . Joint replacement    . Cardiac catheterization  10/2006    normal coronary arteries;   Marland Kitchen Cataract extraction w/ intraocular lens  implant, bilateral Bilateral ~ 2008  . Total hip arthroplasty Left 09/12/2014    Procedure: LEFT TOTAL HIP ARTHROPLASTY ANTERIOR APPROACH;  Surgeon: Mauri Pole, MD;  Location: [facility];  Service: Orthopedics;  Laterality: Left;  . Right heart catheterization N/A 08/13/2014    Rt ht cath prior to hip surgery    History  Substance Use Topics  . Smoking status: Former Smoker -- 0.50 packs/day for 10 years    Types: Cigarettes    Quit date: 12/18/2006  . Smokeless tobacco: Never Used  . Alcohol Use: No     Comment: Quit 2008    Family History  Problem Relation Age of Onset  . Cancer Mother   . Hypertension Mother   . Lung disease Mother   . Diabetes Father   . Diabetes Sister   . Hypertension Sister   . Hypertension Brother   . Hypertension Sister   . Colon cancer Neg Hx     Allergies  Allergen Reactions  . Corlanor [Ivabradine] Palpitations and Other (See Comments)    Kidney and heart problems, syncope  . Januvia [Sitagliptin] Other (See Comments)    "Almost killed me"   . Lunesta [Eszopiclone] Palpitations and Other (See Comments)    Kidney and heart problems, syncope  . Actos [Pioglitazone] Other (See Comments)    Caused blood in urine and fluid retention     Medication list has been reviewed and updated.  Current Outpatient Prescriptions on File Prior to Visit  Medication Sig Dispense Refill  . ACCU-CHEK FASTCLIX LANCETS MISC Test blood sugar once daily. Dx code: E11.8 100 each 3  . acetaminophen (TYLENOL) 500 MG tablet Take 500 mg by mouth every 6 (six) hours as needed for moderate pain or headache.    . albuterol (PROVENTIL HFA;VENTOLIN HFA) 108 (90 BASE)  MCG/ACT inhaler Inhale 2 puffs into the lungs every 6 (six) hours as needed for wheezing or shortness of breath. 1 Inhaler 1  . Alcohol Swabs (B-D SINGLE USE SWABS REGULAR) PADS Test blood sugar daily. Dx code: E11.8 100 each 3  . allopurinol (ZYLOPRIM) 100 MG tablet Take 1 tablet (100 mg total) by mouth daily with breakfast. (Patient taking differently:  Take 100 mg by mouth 2 (two) times daily. ) 90 tablet 3  . apixaban (ELIQUIS) 5 MG TABS tablet Take 1 tablet (5 mg total) by mouth 2 (two) times daily. 180 tablet 3  . baclofen (LIORESAL) 10 MG tablet TAKE 1 TABLET EVERY 8 HOURS AS NEEDED FOR MUSCLE SPASM(S) 90 tablet 1  . Blood Glucose Calibration (ACCU-CHEK SMARTVIEW CONTROL) LIQD Test blood sugar once daily. Dx code: E11.8. 1 each 3  . Blood Glucose Monitoring Suppl (ACCU-CHEK NANO SMARTVIEW) W/DEVICE KIT Test blood sugar daily. Dx code: E11.8 1 kit 0  . carvedilol (COREG) 25 MG tablet TAKE 1/2 TABLET TWICE DAILY  WITH  A  MEAL 90 tablet 2  . colchicine 0.6 MG tablet Take 1.2 mg by mouth daily as needed (Gout). Colcrys    . diclofenac (FLECTOR) 1.3 % PTCH Place 1 patch onto the skin 2 (two) times daily as needed (pain).    Marland Kitchen docusate sodium 100 MG CAPS Take 100 mg by mouth 2 (two) times daily. 10 capsule 0  . glipiZIDE (GLUCOTROL) 5 MG tablet Take 1 tablet (5 mg total) by mouth 2 (two) times daily. 180 tablet 3  . glucose blood (ACCU-CHEK SMARTVIEW) test strip Test blood sugar once daily. Dx code: E11.8 100 each 3  . Hawthorne Berry 550 MG CAPS Take 1,100 mg by mouth 2 (two) times daily.    . hydrALAZINE (APRESOLINE) 50 MG tablet Take 1 tablet (50 mg total) by mouth 3 (three) times daily. 270 tablet 2  . isosorbide mononitrate (IMDUR) 60 MG 24 hr tablet TAKE 1 TABLET (60 MG TOTAL) BY MOUTH DAILY. 90 tablet 2  . meloxicam (MOBIC) 7.5 MG tablet Take 1 tablet (7.5 mg total) by mouth daily. 90 tablet 1  . metolazone (ZAROXOLYN) 5 MG tablet Take 1 tablet (5 mg total) by mouth daily as needed  (Swelling). 30 tablet 2  . Misc Natural Products (TURMERIC CURCUMIN) CAPS Take 1 capsule by mouth 3 (three) times daily.    Marland Kitchen oxyCODONE-acetaminophen (PERCOCET) 10-325 MG per tablet Take 1 tablet by mouth 4 (four) times daily. scheduled    . polyethylene glycol powder (GLYCOLAX/MIRALAX) powder MIX 1 CAPFUL (17 GM) IN LIQUID AND DRINK EVERY DAY AS DIRECTED 1700 g 2  . potassium chloride SA (K-DUR,KLOR-CON) 20 MEQ tablet Take 1 tablet (20 mEq total) by mouth daily. (Patient taking differently: Take 20 mEq by mouth daily as needed (swelling). ) 30 tablet 0  . prednisoLONE acetate (PRED FORTE) 1 % ophthalmic suspension Place 1 drop into the right eye 4 (four) times daily. 5 mL 0  . pregabalin (LYRICA) 75 MG capsule Take 1 capsule (75 mg total) by mouth daily. (Patient taking differently: Take 75 mg by mouth 2 (two) times daily. ) 90 capsule 3  . selenium 50 MCG TABS tablet Take 200 mcg by mouth daily.    Marland Kitchen spironolactone (ALDACTONE) 25 MG tablet Take 1 tablet (25 mg total) by mouth daily. 90 tablet 3  . torsemide (DEMADEX) 20 MG tablet Take 2 tablets (40 mg total) by mouth 2 (two) times daily. 360 tablet 2  . valACYclovir (VALTREX) 1000 MG tablet Take 1 tablet (1,000 mg total) by mouth 3 (three) times daily. 42 tablet 0  . vitamin B-12 (CYANOCOBALAMIN) 250 MCG tablet Take 250 mcg by mouth daily.     No current facility-administered medications on file prior to visit.    Review of Systems:  As per HPI- otherwise negative.   Physical Examination: Filed Vitals:  06/30/15 1445  BP: 130/80  Pulse: 65  Temp: 98 F (36.7 C)  Resp: 17   Filed Vitals:   06/30/15 1445  Height: '5\' 8"'  (1.727 m)  Weight: 244 lb (110.678 kg)   Body mass index is 37.11 kg/(m^2). Ideal Body Weight: Weight in (lb) to have BMI = 25: 164.1  GEN: WDWN, NAD, Non-toxic, A & O x 3, obese, looks well HEENT: Atraumatic, Normocephalic. Neck supple. No masses, No LAD. Healing rash on the right forehead.  Eyelids do not  show any lesion Ears and Nose: No external deformity. CV: RRR, No M/G/R. No JVD. No thrill. No extra heart sounds. PULM: CTA B, no wheezes, crackles, rhonchi. No retractions. No resp. distress. No accessory muscle use. EXTR: No c/c.  Perhaps trace LE edema but he is wearing bilateral compression stockings NEURO Normal gait.  PSYCH: Normally interactive. Conversant. Not depressed or anxious appearing.  Calm demeanor. \  [facility] reading (PRIMARY) by  Dr. Lorelei Pont. CXR: enlarged heart with scarring or atelectasis in the right base, appears improved from his last film on 7/27  CHEST 2 VIEW  COMPARISON: PA and lateral chest 06/18/2015 and 11/23/2014.  FINDINGS: Again seen is marked cardiomegaly. No consolidative process, pulmonary edema or pneumothorax identified. No pleural effusion.  IMPRESSION: Marked cardiomegaly without acute disease. Assessment and Plan: SOB (shortness of breath) - Plan: DG Chest 2 View  Wheezing - Plan: albuterol (PROVENTIL HFA;VENTOLIN HFA) 108 (90 BASE) MCG/ACT inhaler  Acute on chronic systolic congestive heart failure - Plan: Basic metabolic panel, torsemide (DEMADEX) 20 MG tablet  Hypokalemia - Plan: potassium chloride SA (K-DUR,KLOR-CON) 20 MEQ tablet  Here today with CHF exacerbation.  He has added metolazone for the past 2 days. Will have him continue this, and increase torsemide from 40 BID to 80 BID. Will increase his potassium as well to compensate, check BMP today.  He is asked to see me in 48 hours- if not improving or if getting worse seek care sooner Counseled that he should see optho this week, not next.  Made appt for tomorrow   Signed [name], MD

## 2015-06-30 NOTE — Patient Outreach (Signed)
[facility]) Care Management  06/30/2015  Frank Davidson February 11, 1951 [id number]   Third transition of care outreach attempt however remains unsuccessful however RN able to leave a HIPAA approved message via voice mail. Will allow time for pt to  call back prior to sending a notification letter.   [name], RN Care Management Coordinator [facility] Main Office [phone]

## 2015-07-01 ENCOUNTER — Ambulatory Visit (INDEPENDENT_AMBULATORY_CARE_PROVIDER_SITE_OTHER): Payer: Commercial Managed Care - HMO | Admitting: Family Medicine

## 2015-07-01 ENCOUNTER — Encounter: Payer: Self-pay | Admitting: *Deleted

## 2015-07-01 ENCOUNTER — Inpatient Hospital Stay (HOSPITAL_COMMUNITY)
Admission: AD | Admit: 2015-07-01 | Discharge: 2015-07-04 | DRG: 682 | Disposition: A | Discharge status: Home / Self Care | Payer: Medicare HMO | Source: Ambulatory Visit | Attending: Family Medicine | Admitting: Family Medicine

## 2015-07-01 ENCOUNTER — Emergency Department (HOSPITAL_COMMUNITY): Admission: EM | Admit: 2015-07-01 | Discharge: 2015-07-01 | Payer: Medicare HMO | Source: Home / Self Care

## 2015-07-01 ENCOUNTER — Encounter (HOSPITAL_COMMUNITY): Payer: Self-pay | Admitting: Emergency Medicine

## 2015-07-01 VITALS — BP 124/82 | HR 72 | Temp 97.9°F | Resp 20 | Ht 68.0 in | Wt 240.1 lb

## 2015-07-01 DIAGNOSIS — Z833 Family history of diabetes mellitus: Secondary | ICD-10-CM

## 2015-07-01 DIAGNOSIS — N189 Chronic kidney disease, unspecified: Secondary | ICD-10-CM | POA: Diagnosis not present

## 2015-07-01 DIAGNOSIS — Z6834 Body mass index (BMI) 34.0-34.9, adult: Secondary | ICD-10-CM | POA: Diagnosis not present

## 2015-07-01 DIAGNOSIS — E669 Obesity, unspecified: Secondary | ICD-10-CM | POA: Diagnosis present

## 2015-07-01 DIAGNOSIS — I480 Paroxysmal atrial fibrillation: Secondary | ICD-10-CM | POA: Diagnosis present

## 2015-07-01 DIAGNOSIS — Z79899 Other long term (current) drug therapy: Secondary | ICD-10-CM

## 2015-07-01 DIAGNOSIS — J449 Chronic obstructive pulmonary disease, unspecified: Secondary | ICD-10-CM | POA: Diagnosis present

## 2015-07-01 DIAGNOSIS — J45909 Unspecified asthma, uncomplicated: Secondary | ICD-10-CM | POA: Diagnosis present

## 2015-07-01 DIAGNOSIS — N179 Acute kidney failure, unspecified: Secondary | ICD-10-CM | POA: Diagnosis present

## 2015-07-01 DIAGNOSIS — Z87891 Personal history of nicotine dependence: Secondary | ICD-10-CM | POA: Diagnosis not present

## 2015-07-01 DIAGNOSIS — E785 Hyperlipidemia, unspecified: Secondary | ICD-10-CM | POA: Diagnosis present

## 2015-07-01 DIAGNOSIS — M1990 Unspecified osteoarthritis, unspecified site: Secondary | ICD-10-CM | POA: Diagnosis present

## 2015-07-01 DIAGNOSIS — I130 Hypertensive heart and chronic kidney disease with heart failure and stage 1 through stage 4 chronic kidney disease, or unspecified chronic kidney disease: Secondary | ICD-10-CM | POA: Diagnosis present

## 2015-07-01 DIAGNOSIS — Z7901 Long term (current) use of anticoagulants: Secondary | ICD-10-CM | POA: Diagnosis not present

## 2015-07-01 DIAGNOSIS — I5023 Acute on chronic systolic (congestive) heart failure: Secondary | ICD-10-CM | POA: Diagnosis present

## 2015-07-01 DIAGNOSIS — G894 Chronic pain syndrome: Secondary | ICD-10-CM | POA: Diagnosis present

## 2015-07-01 DIAGNOSIS — G4733 Obstructive sleep apnea (adult) (pediatric): Secondary | ICD-10-CM | POA: Diagnosis present

## 2015-07-01 DIAGNOSIS — I509 Heart failure, unspecified: Secondary | ICD-10-CM

## 2015-07-01 DIAGNOSIS — N183 Chronic kidney disease, stage 3 unspecified: Secondary | ICD-10-CM

## 2015-07-01 DIAGNOSIS — Z96642 Presence of left artificial hip joint: Secondary | ICD-10-CM | POA: Diagnosis present

## 2015-07-01 DIAGNOSIS — I428 Other cardiomyopathies: Secondary | ICD-10-CM | POA: Diagnosis present

## 2015-07-01 DIAGNOSIS — B0230 Zoster ocular disease, unspecified: Secondary | ICD-10-CM | POA: Diagnosis present

## 2015-07-01 DIAGNOSIS — M797 Fibromyalgia: Secondary | ICD-10-CM | POA: Diagnosis present

## 2015-07-01 DIAGNOSIS — Z8249 Family history of ischemic heart disease and other diseases of the circulatory system: Secondary | ICD-10-CM | POA: Diagnosis not present

## 2015-07-01 DIAGNOSIS — M109 Gout, unspecified: Secondary | ICD-10-CM | POA: Diagnosis present

## 2015-07-01 DIAGNOSIS — Z8673 Personal history of transient ischemic attack (TIA), and cerebral infarction without residual deficits: Secondary | ICD-10-CM

## 2015-07-01 DIAGNOSIS — R0602 Shortness of breath: Secondary | ICD-10-CM

## 2015-07-01 DIAGNOSIS — Z888 Allergy status to other drugs, medicaments and biological substances status: Secondary | ICD-10-CM | POA: Diagnosis not present

## 2015-07-01 DIAGNOSIS — E119 Type 2 diabetes mellitus without complications: Secondary | ICD-10-CM | POA: Diagnosis not present

## 2015-07-01 DIAGNOSIS — K219 Gastro-esophageal reflux disease without esophagitis: Secondary | ICD-10-CM | POA: Diagnosis present

## 2015-07-01 DIAGNOSIS — R0600 Dyspnea, unspecified: Secondary | ICD-10-CM | POA: Insufficient documentation

## 2015-07-01 DIAGNOSIS — I10 Essential (primary) hypertension: Secondary | ICD-10-CM | POA: Diagnosis present

## 2015-07-01 DIAGNOSIS — E1122 Type 2 diabetes mellitus with diabetic chronic kidney disease: Secondary | ICD-10-CM | POA: Diagnosis present

## 2015-07-01 HISTORY — DX: Reserved for inherently not codable concepts without codable children: IMO0001

## 2015-07-01 MED ORDER — SODIUM CHLORIDE 0.9 % IJ SOLN
3.0000 mL | Freq: Two times a day (BID) | INTRAMUSCULAR | Status: DC
  Administered 2015-07-01 – 2015-07-04 (×5): 3 mL via INTRAVENOUS

## 2015-07-01 MED ORDER — ONDANSETRON HCL 4 MG PO TABS: 4.0000 mg | ORAL_TABLET | Freq: Four times a day (QID) | ORAL | Status: DC | PRN

## 2015-07-01 MED ORDER — POLYETHYLENE GLYCOL 3350 17 G PO PACK
17.0000 g | PACK | Freq: Every day | ORAL | Status: DC
  Administered 2015-07-02 – 2015-07-04 (×3): 17 g via ORAL
  Filled 2015-07-01 (×3): qty 1

## 2015-07-01 MED ORDER — PREDNISOLONE ACETATE 1 % OP SUSP
1.0000 [drp] | Freq: Four times a day (QID) | OPHTHALMIC | Status: DC
  Administered 2015-07-02 – 2015-07-04 (×10): 1 [drp] via OPHTHALMIC
  Filled 2015-07-01 (×2): qty 1

## 2015-07-01 MED ORDER — SODIUM CHLORIDE 0.9 % IJ SOLN
3.0000 mL | Freq: Two times a day (BID) | INTRAMUSCULAR | Status: DC
  Administered 2015-07-01: 3 mL via INTRAVENOUS

## 2015-07-01 MED ORDER — SPIRONOLACTONE 25 MG PO TABS
25.0000 mg | ORAL_TABLET | Freq: Every day | ORAL | Status: DC
  Administered 2015-07-03: 25 mg via ORAL
  Filled 2015-07-01 (×3): qty 1

## 2015-07-01 MED ORDER — ONDANSETRON HCL 4 MG/2ML IJ SOLN: 4.0000 mg | Freq: Four times a day (QID) | INTRAMUSCULAR | Status: DC | PRN

## 2015-07-01 MED ORDER — CARVEDILOL 12.5 MG PO TABS
12.5000 mg | ORAL_TABLET | Freq: Two times a day (BID) | ORAL | Status: DC
  Administered 2015-07-02 – 2015-07-04 (×5): 12.5 mg via ORAL
  Filled 2015-07-01 (×7): qty 1

## 2015-07-01 MED ORDER — PREGABALIN 25 MG PO CAPS
25.0000 mg | ORAL_CAPSULE | Freq: Two times a day (BID) | ORAL | Status: DC
  Administered 2015-07-02 – 2015-07-04 (×5): 25 mg via ORAL
  Filled 2015-07-01 (×6): qty 1

## 2015-07-01 MED ORDER — ACETAMINOPHEN 650 MG RE SUPP: 650.0000 mg | Freq: Four times a day (QID) | RECTAL | Status: DC | PRN

## 2015-07-01 MED ORDER — ISOSORBIDE MONONITRATE ER 60 MG PO TB24
60.0000 mg | ORAL_TABLET | Freq: Every day | ORAL | Status: DC
Start: 2015-07-02 — End: 2015-07-04
  Administered 2015-07-02 – 2015-07-04 (×3): 60 mg via ORAL
  Filled 2015-07-01 (×3): qty 1

## 2015-07-01 MED ORDER — SODIUM CHLORIDE 0.9 % IJ SOLN: 3.0000 mL | INTRAMUSCULAR | Status: DC | PRN

## 2015-07-01 MED ORDER — VALACYCLOVIR HCL 500 MG PO TABS
1000.0000 mg | ORAL_TABLET | Freq: Three times a day (TID) | ORAL | Status: DC
  Administered 2015-07-02 – 2015-07-04 (×7): 1000 mg via ORAL
  Filled 2015-07-01 (×11): qty 2

## 2015-07-01 MED ORDER — INSULIN ASPART 100 UNIT/ML ~~LOC~~ SOLN: 0.0000 [IU] | Freq: Every day | SUBCUTANEOUS | Status: DC

## 2015-07-01 MED ORDER — ACETAMINOPHEN 325 MG PO TABS
650.0000 mg | ORAL_TABLET | Freq: Four times a day (QID) | ORAL | Status: DC | PRN
  Administered 2015-07-03 (×2): 650 mg via ORAL
  Filled 2015-07-01 (×2): qty 2

## 2015-07-01 MED ORDER — SODIUM CHLORIDE 0.9 % IV SOLN: 250.0000 mL | INTRAVENOUS | Status: DC | PRN

## 2015-07-01 MED ORDER — BACLOFEN 10 MG PO TABS
10.0000 mg | ORAL_TABLET | Freq: Three times a day (TID) | ORAL | Status: DC | PRN
  Administered 2015-07-02 – 2015-07-03 (×2): 10 mg via ORAL
  Filled 2015-07-01 (×4): qty 1

## 2015-07-01 MED ORDER — APIXABAN 5 MG PO TABS
5.0000 mg | ORAL_TABLET | Freq: Two times a day (BID) | ORAL | Status: DC
  Administered 2015-07-02 – 2015-07-04 (×5): 5 mg via ORAL
  Filled 2015-07-01 (×8): qty 1

## 2015-07-01 MED ORDER — ASPIRIN EC 81 MG PO TBEC
81.0000 mg | DELAYED_RELEASE_TABLET | Freq: Every day | ORAL | Status: DC
  Administered 2015-07-02 – 2015-07-04 (×3): 81 mg via ORAL
  Filled 2015-07-01 (×4): qty 1

## 2015-07-01 MED ORDER — DOCUSATE SODIUM 100 MG PO CAPS
100.0000 mg | ORAL_CAPSULE | Freq: Two times a day (BID) | ORAL | Status: DC
  Filled 2015-07-01 (×3): qty 1

## 2015-07-01 MED ORDER — OXYCODONE-ACETAMINOPHEN 10-325 MG PO TABS: 1.0000 | ORAL_TABLET | Freq: Four times a day (QID) | ORAL | Status: DC | PRN

## 2015-07-01 MED ORDER — INSULIN ASPART 100 UNIT/ML ~~LOC~~ SOLN
0.0000 [IU] | Freq: Three times a day (TID) | SUBCUTANEOUS | Status: DC
  Administered 2015-07-03: 1 [IU] via SUBCUTANEOUS
  Administered 2015-07-03: 2 [IU] via SUBCUTANEOUS
  Administered 2015-07-04 (×2): 1 [IU] via SUBCUTANEOUS

## 2015-07-01 NOTE — Patient Outreach (Signed)
[facility]) Care Management  07/01/2015  TREYVOR MARSALA [birth year] [id number]   RN requested letter to be sent to this pt due to three attempts of unsuccessful outreach calls. RN will allow 10 days to respond prior to notifying pt's primary doctor that this case will be closed.   [name], RN Care Management Coordinator [facility] Main Office [phone]

## 2015-07-01 NOTE — ED Notes (Signed)
Pt. report exertional dyspnea with lower leg edema and fatigue  onset this week , seen at [facility] urgent care today .

## 2015-07-01 NOTE — H&P (Signed)
[facility] Admission History and Physical Service Pager: [phone]  Patient name: Frank Davidson Medical record number: [id number] Date of birth: 01-19-1951 Age: 64 y.o. Gender: male  Primary Care Provider: [name], MD Consultants: Cardiology (Heart Failure) Code Status: Full (confirmed on admission)  Chief Complaint: Elevated Creatinine, Dyspnea, LE Edema  Assessment and Plan: Frank Davidson is a 64 y.o. male presenting as direct admit from [facility] for significantly elevated creatinine consistent with Acute on Chronic Renal Failure, also continued dyspnea, wt gain, LE edema, with recent CHF exacerbation (hospitalized from 7/27 to 8/3). PMH is significant for Systolic CHF with NICM (HFrEF with last EF 20%, 11/2014), PAF on chronic anticoagulation, HTN, HLD, DM2 controlled, h/o CVA, COPD, gout, chronic pain syndrome with arthritis / fibromyalgia.  # AoCKD-III, in setting of recent aggressive diuresis for CHF exacerbation - New finding of acutely elevated serum creatinine today at [facility] after recent aggressive diuresis for CHF exac/hospitalization. SCr up to 4.6 (baseline 2-2.5, recently 2 on 06/24/15) and elevated BUN 82 (ratio of 17). Currently voiding well on aggressive home PO diuresis. Concern for cardiorenal syndrome with continued CHF symptoms (dyspnea, LE edema) despite clinical improved volume status (seems hypervolemia but wt down and nearing dry wt / baseline), likely intravascularly depleted, presumed multifactorial with intrinsic/ATN and pre-renal etiology. - Directly admitted to telemetry, inpatient status - Vitals per protocol, no O2 requirement on admit - SLIV, heart healthy diet started - Hold nephrotoxic agents including diuresis on admit (torsemide, metolazone, spiro - given improved wt, less likely acute CHF, pending further work-up on admit with BNP, CXR, repeat labs). Discontinued Mobic (wasn't taking per report), no NSAIDs. - If work-up  suggests acutely overloaded, will consider resuming diuresis to see if Cr responds - Trend daily BMET, SCr, add Mag / Phos in AM - Check UA for protein, sediments - Consider further renal work-up with Renal US, Nephrology consult  # Chronic Systolic CHF, with persistent dyspnea / LE edema, less likely acute CHF exacerbation - Recent hospitalization 7/27 to 8/3 for acute CHF, treated with aggressive diuresis, now seems to be worsening with return of symptoms, dyspnea, LE edema, wt fluctuating. Followed by Cardiology-AHF clinic closely, last ECHO 11/2014 with reduced EF 20%), pt has declined ICD previously. Currently dry wt 230-235 per report / charts, on admit now 234 lbs, does not appear acutely volume overloaded, improved diuresis on metolazone, inc torsemide 80 BID and spiro, seems to be responding, likely etiology of AKI. Still with symptoms, likely similar to baseline - Vitals per protocol, BP normal on admit, no O2 requirement - Strict I/O, daily wts - Hold aggressive diuresis on admit (work-up with BNP, CXR, repeat BMET), likely trend SCr until tomorrow to see if responding, give no acute evidence of CHF at this time. - Consult Cardiology, AHF team in AM for continued assistance of established patient  # DM2, controlled - Last A1c 5.6 (12/2014), diet controlled and on glipizide. Not on ACE/ARB or Metformin - Hold home glipizide - Sensitive SSI and check CBG q AC/HS protocol  # PAF, on chronic anticoagulation - Continue home Eliquis 1m BID, start dosing tomorrow 8/10 (already received home doses today)  # HTN, controlled - Stable BP on admit, 110-120/70-80s - Already took home meds today - Hold diuretics on admit, re-evaluate fluid status in AM - Hold Hydralazine on admit, may need more aggressive diuresis pending further work-up and will want to avoid hypotension  # Chronic Pain Syndrome / Arthritis / Fibromyalgia - Involvement of  multiple sites / joints, with chronic pain at  baseline. No exacerbation. - Continue home Percocet 10/325 q 6 hr PRN - Continue home bowel regimen - Miralax 17g daily, Colace 142m BID - reduced Lyrica to 275mBID (from 75, for renal dosing)  # H/o gout - Hold allopurinol given AoCKD  # HZO - Continue Acyclovir 100079mID and Prednisone eye drops BID as prescribed - Follow-up with Ophtho on discharge  FEN/GI: SLIV / Heart healthy-Carb mod diet Prophylaxis: Continue chronic anticoagulation - Eliquis (completed home doses 8/9, start 8/10)  Disposition: admit to inpatient for AoCKD and possible Acute systolic CHF exacerbation, consult Heart Failure team for further diuresis recommendations. Complete work-up and follow kidney function.  History of Present Illness: Frank Davidson a 64 52o. male [facility] his symptoms have persisted but do seem better than when he was hospitalized last time, continues with persistent DOE (minimal exertion), short distances, seems unchanged, stable 1-2 pillow orthopnea without change. LE edema has increased but seems steady for few days now. Voiding well on diuresis, taking Metolazone 5mg39mily x 2 days (did not take today) and Torsemide increased to 80mg34mBID for past 2 days (took both doses today already).  Denies taking any NSAIDs. Not sure if taking Mobic anymore.  Followed by Cardiology (Heart Failure Clinic) Dr. BensiHaroldine Lawsally following up every 4-6 weeks. Follows low salt diet, 2 L fluid restriction daily, has been checking regular weights. Additionally states he has refused ICD placement in past. Last ECHO 11/2014. On Eliquis for about past 4 years for CAD. No prior MI. Denies having prior CABG or stent placement.  [facility] Matti[facility]cy Davidson[facility] Has not discussed hemodialysis per patients report.  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 Did not see eye doctor today,  but called and advised to continue valacyclovir and prednisone eye drops for up to 1 more week. Will follow-up.   Review Of Systems: Per HPI with the following additions: none Otherwise 12 point review of systems was performed and was unremarkable.  Patient Active Problem List   Diagnosis Date Noted  . Acute renal failure superimposed on stage 3 chronic kidney disease 07/01/2015  . Herpes ocular   . Acute on chronic systolic heart failure 05/2831/67/1245acial rash 06/19/2015  . Diabetes mellitus type 2, controlled 06/19/2015  . Essential hypertension 06/19/2015  . Chronic anemia 06/19/2015  . CHF (congestive heart failure) 06/19/2015  . CHF exacerbation 06/18/2015  . Non-ischemic cardiomyopathy- EF 15% echo Aug 2015 11/24/2014  . Chronic anticoagulation 11/24/2014  . S/P left THA, AA 09/12/2014  . Gout 04/29/2014  . Ocular herpes   . Neuropathy   . OSA (obstructive sleep apnea) 07/09/2013  . Chronic systolic heart failure 06/1479/99/8338VA - Aug 2014 (Rt brain) 07/03/2013  . PAF (paroxysmal atrial fibrillation) 07/03/2013  . Hypertension   . COPD (chronic obstructive pulmonary disease)   . Diabetes mellitus with nephropathy   . CKD (chronic kidney disease), stage III   . Polysubstance abuse    Past Medical History: Past Medical History  Diagnosis Date  . Atrial fibrillation     PAF  . Hypertension   . COPD (chronic obstructive pulmonary disease)   . Ocular herpes   . Gout   . CHF (congestive heart failure)   . Chronic pain   . Hyperlipidemia   . Nonischemic cardiomyopathy  Aug 2015    EF 15%  . Ocular herpes   . Neuropathy   . Left-sided weakness     "because of the arthritis and edema"  . PVD (peripheral vascular disease) May 2015    abnormal ABIs  . Memory impairment     "short term"  . CKD (chronic kidney disease), stage III     Dr. Mercy Moore follows-holding   . Fibromyalgia     neuropathy- hands, more than feet.  . Sleep apnea with use of continuous  positive airway pressure (CPAP)     does not use cpap  . GERD (gastroesophageal reflux disease)     tums as needed  . Asthma   . Type II diabetes mellitus   . Stroke X 5    last stroke aug 2014"; denies residual on 09/12/2014  . Osteoarthritis   . Arthritis     "knees, right elbow, shoulder" (09/12/2014)   Past Surgical History: Past Surgical History  Procedure Laterality Date  . 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 Tonsillectomy  age 9  . Esophagogastroduodenoscopy N/A 12/24/2013    Procedure: ESOPHAGOGASTRODUODENOSCOPY (EGD);  Surgeon: Inda Castle, MD;  Location: [facility];  Service: Endoscopy;  Laterality: N/A;  . Colonoscopy N/A 12/24/2013    Procedure: COLONOSCOPY;  Surgeon: Inda Castle, MD;  Location: [facility];  Service: Endoscopy;  Laterality: N/A;  . Joint replacement    . Cardiac catheterization  10/2006    normal coronary arteries;   Marland Kitchen Cataract extraction w/ intraocular lens  implant, bilateral Bilateral ~ 2008  . Total hip arthroplasty Left 09/12/2014    Procedure: LEFT TOTAL HIP ARTHROPLASTY ANTERIOR APPROACH;  Surgeon: Mauri Pole, MD;  Location: [facility];  Service: Orthopedics;  Laterality: Left;  . Right heart catheterization N/A 08/13/2014    Rt ht cath prior to hip surgery   Social History: History  Substance Use Topics  . Smoking status: Former Smoker -- 0.00 packs/day for 0 years    Types: Cigarettes    Quit date: 12/18/2006  . Smokeless tobacco: Never Used  . Alcohol Use: No     Comment: Quit 2008   Additional social history:  none  Please also refer to relevant sections of EMR.  Family History: Family History  Problem Relation Age of Onset  . Cancer Mother   . Hypertension Mother   . Lung disease Mother   . Diabetes Father   . Diabetes Sister   . Hypertension Sister   . Hypertension Brother   . Hypertension Sister   . Colon cancer Neg Hx    Allergies and Medications: Allergies  Allergen Reactions  . Corlanor [Ivabradine]  Palpitations and Other (See Comments)    Kidney and heart problems, syncope  . Januvia [Sitagliptin] Other (See Comments)    "Almost killed me"   . Lunesta [Eszopiclone] Palpitations and Other (See Comments)    Kidney and heart problems, syncope  . Actos [Pioglitazone] Other (See Comments)    Caused blood in urine and fluid retention    No current facility-administered medications on file prior to encounter.   Current Outpatient Prescriptions on File Prior to Encounter  Medication Sig Dispense Refill  . ACCU-CHEK FASTCLIX LANCETS MISC Test blood sugar once daily. Dx code: E11.8 100 each 3  . acetaminophen (TYLENOL) 500 MG tablet Take 500 mg by mouth every 6 (six) hours as needed for moderate pain or headache.    . albuterol (PROVENTIL HFA;VENTOLIN HFA) 108 (90 BASE)  MCG/ACT inhaler Inhale 2 puffs into the lungs every 6 (six) hours as needed for wheezing or shortness of breath. 1 Inhaler 1  . Alcohol Swabs (B-D SINGLE USE SWABS REGULAR) PADS Test blood sugar daily. Dx code: E11.8 100 each 3  . allopurinol (ZYLOPRIM) 100 MG tablet Take 1 tablet (100 mg total) by mouth daily with breakfast. (Patient taking differently: Take 100 mg by mouth 2 (two) times daily. ) 90 tablet 3  . apixaban (ELIQUIS) 5 MG TABS tablet Take 1 tablet (5 mg total) by mouth 2 (two) times daily. 180 tablet 3  . baclofen (LIORESAL) 10 MG tablet TAKE 1 TABLET EVERY 8 HOURS AS NEEDED FOR MUSCLE SPASM(S) 90 tablet 1  . Blood Glucose Calibration (ACCU-CHEK SMARTVIEW CONTROL) LIQD Test blood sugar once daily. Dx code: E11.8. 1 each 3  . Blood Glucose Monitoring Suppl (ACCU-CHEK NANO SMARTVIEW) W/DEVICE KIT Test blood sugar daily. Dx code: E11.8 1 kit 0  . carvedilol (COREG) 25 MG tablet TAKE 1/2 TABLET TWICE DAILY  WITH  A  MEAL 90 tablet 2  . colchicine 0.6 MG tablet Take 1.2 mg by mouth daily as needed (Gout). Colcrys    . diclofenac (FLECTOR) 1.3 % PTCH Place 1 patch onto the skin 2 (two) times daily as needed (pain).    Marland Kitchen  docusate sodium 100 MG CAPS Take 100 mg by mouth 2 (two) times daily. 10 capsule 0  . glipiZIDE (GLUCOTROL) 5 MG tablet Take 1 tablet (5 mg total) by mouth 2 (two) times daily. 180 tablet 3  . glucose blood (ACCU-CHEK SMARTVIEW) test strip Test blood sugar once daily. Dx code: E11.8 100 each 3  . Hawthorne Berry 550 MG CAPS Take 1,100 mg by mouth 2 (two) times daily.    . hydrALAZINE (APRESOLINE) 50 MG tablet Take 1 tablet (50 mg total) by mouth 3 (three) times daily. 270 tablet 2  . isosorbide mononitrate (IMDUR) 60 MG 24 hr tablet TAKE 1 TABLET (60 MG TOTAL) BY MOUTH DAILY. 90 tablet 2  . metolazone (ZAROXOLYN) 5 MG tablet Take 1 tablet (5 mg total) by mouth daily as needed (Swelling). 30 tablet 2  . Misc Natural Products (TURMERIC CURCUMIN) CAPS Take 1 capsule by mouth 3 (three) times daily.    Marland Kitchen oxyCODONE-acetaminophen (PERCOCET) 10-325 MG per tablet Take 1 tablet by mouth 4 (four) times daily. scheduled    . polyethylene glycol powder (GLYCOLAX/MIRALAX) powder MIX 1 CAPFUL (17 GM) IN LIQUID AND DRINK EVERY DAY AS DIRECTED 1700 g 2  . potassium chloride SA (K-DUR,KLOR-CON) 20 MEQ tablet Take 1 tablet (20 mEq total) by mouth daily. 30 tablet 0  . prednisoLONE acetate (PRED FORTE) 1 % ophthalmic suspension Place 1 drop into the right eye 4 (four) times daily. 5 mL 0  . pregabalin (LYRICA) 75 MG capsule Take 1 capsule (75 mg total) by mouth daily. (Patient taking differently: Take 75 mg by mouth 2 (two) times daily. ) 90 capsule 3  . selenium 50 MCG TABS tablet Take 200 mcg by mouth daily.    Marland Kitchen spironolactone (ALDACTONE) 25 MG tablet Take 1 tablet (25 mg total) by mouth daily. 90 tablet 3  . torsemide (DEMADEX) 20 MG tablet Take 2 tablets (40 mg total) by mouth 2 (two) times daily. 60 tablet 0  . valACYclovir (VALTREX) 1000 MG tablet Take 1 tablet (1,000 mg total) by mouth 3 (three) times daily. 42 tablet 0  . vitamin B-12 (CYANOCOBALAMIN) 250 MCG tablet Take 250 mcg by mouth daily.  Objective: BP 118/75 mmHg  Pulse 70  Temp(Src) 97.8 F (36.6 C) (Oral)  Resp 20  Ht '5\' 8"'  (1.727 m)  Wt 234 lb 1.6 oz (106.187 kg)  BMI 35.60 kg/m2  SpO2 98% Exam: General: obese, chronically ill appearing, laying in elevated bed, seems comfortable, cooperative, NAD HEENT: NCAT scalp with healing shingles lesions, PERRL, EOMI, sclera / conjunctiva clear, patent nares, oropharynx clear, MMM Neck: supple, FROM, non-tender, without significant JVD Cardiovascular: RRR, no murmurs appreciated Respiratory: Mostly CTAB without overt crackles or wheezing. No rhonchi. Diminished air movement bilateral bases, improved upper air fields with decent air movement. Speaks full sentences. Abdomen: obese, soft, NTND, no masses, no rebound, +active BS Ext: +1 pitting edema mid shin to feet bilaterally, calves non-tender Skin: warm, dry. Healing shingles lesions on scalp and across bridge of nose. Neuro: awake, alert, oriented x 3, grossly non-focal, muscle str 5/5 equal grip, ankle dorsiflex, intact distal sensation to light touch Psych: well groomed, good eye contact, appropriate insight  Labs and Imaging: CBC BMET  No results for input(s): WBC, HGB, HCT, PLT in the last 168 hours.  Recent Labs Lab 06/30/15 1618  NA 133*  K 4.1  CL 95*  CO2 21  BUN 82*  CREATININE 4.61*  GLUCOSE 88  CALCIUM 9.5     BNP - ordered on admit  CXR 2 view - ordered on admit  EKG - NSR, HR 94, low voltage, no acute ischemic ST-T wave changes, stable compared to 05/2015.  Olin Hauser, DO 07/01/2015, 11:28 PM PGY-3, [place] [facility] pager: [phone], text pages welcome

## 2015-07-01 NOTE — Patient Instructions (Signed)
You may go home and the hospital will call you with a bed assignment as soon as it becomes available

## 2015-07-01 NOTE — Progress Notes (Signed)
[facility] [address] [place] [address] [phone]  Date:  07/01/2015   Name:  Frank Davidson   DOB:  04-30-1951   MRN:  [id number]  PCP:  [name], MD    Chief Complaint: Follow-up   History of Present Illness:  Frank Davidson is a 64 y.o. very pleasant male patient who presents with the following:  Seen yesterday with CHF exacerbation.  I asked him to come back in as his creat went up to over 4.5 on yesterday's labs. He does have a known history of CRF, but his creat is generally approx 2- 2.5.   He notes that he did not sleep that well last night and is feeling weak and a bit SOB.  His is taking his increased regimen of diuretics as discussed yesterday He notes that he has a bit more LE edema  However his weight is improved from yesterday  Wt Readings from Last 3 Encounters:  07/01/15 240 lb 2 oz (108.92 kg)  06/30/15 244 lb (110.678 kg)  06/24/15 235 lb 3.7 oz (106.7 kg)     Patient Active Problem List   Diagnosis Date Noted  . Herpes ocular   . Acute on chronic systolic heart failure 79/48/0165  . Facial rash 06/19/2015  . Diabetes mellitus type 2, controlled 06/19/2015  . Essential hypertension 06/19/2015  . Chronic anemia 06/19/2015  . CHF (congestive heart failure) 06/19/2015  . CHF exacerbation 06/18/2015  . Non-ischemic cardiomyopathy- EF 15% echo Aug 2015 11/24/2014  . Chronic anticoagulation 11/24/2014  . S/P left THA, AA 09/12/2014  . Gout 04/29/2014  . Ocular herpes   . Neuropathy   . OSA (obstructive sleep apnea) 07/09/2013  . Chronic systolic heart failure 53/74/8270  . 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 PAF (paroxysmal atrial fibrillation) 07/03/2013  . Hypertension   . COPD (chronic obstructive pulmonary disease)   . Diabetes mellitus with nephropathy   . CKD (chronic kidney disease), stage III   . Polysubstance abuse     Past Medical History  Diagnosis Date  . Atrial fibrillation     PAF  .  Hypertension   . COPD (chronic obstructive pulmonary disease)   . Ocular herpes   . Gout   . CHF (congestive heart failure)   . Chronic pain   . Hyperlipidemia   . Nonischemic cardiomyopathy Aug 2015    EF 15%  . Ocular herpes   . Neuropathy   . Left-sided weakness     "because of the arthritis and edema"  . PVD (peripheral vascular disease) May 2015    abnormal ABIs  . Memory impairment     "short term"  . CKD (chronic kidney disease), stage III     Dr. Mercy Moore follows-holding   . Fibromyalgia     neuropathy- hands, more than feet.  . Sleep apnea with use of continuous positive airway pressure (CPAP)     does not use cpap  . GERD (gastroesophageal reflux disease)     tums as needed  . Asthma   . Type II diabetes mellitus   . Stroke X 5    last stroke aug 2014"; denies residual on 09/12/2014  . Osteoarthritis   . Arthritis     "knees, right elbow, shoulder" (09/12/2014)    Past Surgical History  Procedure Laterality Date  . 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 Tonsillectomy  age 69  . Esophagogastroduodenoscopy N/A 12/24/2013  Procedure: ESOPHAGOGASTRODUODENOSCOPY (EGD);  Surgeon: Inda Castle, MD;  Location: [facility];  Service: Endoscopy;  Laterality: N/A;  . Colonoscopy N/A 12/24/2013    Procedure: COLONOSCOPY;  Surgeon: Inda Castle, MD;  Location: [facility];  Service: Endoscopy;  Laterality: N/A;  . Joint replacement    . Cardiac catheterization  10/2006    normal coronary arteries;   Marland Kitchen Cataract extraction w/ intraocular lens  implant, bilateral Bilateral ~ 2008  . Total hip arthroplasty Left 09/12/2014    Procedure: LEFT TOTAL HIP ARTHROPLASTY ANTERIOR APPROACH;  Surgeon: Mauri Pole, MD;  Location: [place];  Service: Orthopedics;  Laterality: Left;  . Right heart catheterization N/A 08/13/2014    Rt ht cath prior to hip surgery    History  Substance Use Topics  . Smoking status: Former Smoker -- 0.50 packs/day for 10 years    Types: Cigarettes     Quit date: 12/18/2006  . Smokeless tobacco: Never Used  . Alcohol Use: No     Comment: Quit 2008    Family History  Problem Relation Age of Onset  . Cancer Mother   . Hypertension Mother   . Lung disease Mother   . Diabetes Father   . Diabetes Sister   . Hypertension Sister   . Hypertension Brother   . Hypertension Sister   . Colon cancer Neg Hx     Allergies  Allergen Reactions  . Corlanor [Ivabradine] Palpitations and Other (See Comments)    Kidney and heart problems, syncope  . Januvia [Sitagliptin] Other (See Comments)    "Almost killed me"   . Lunesta [Eszopiclone] Palpitations and Other (See Comments)    Kidney and heart problems, syncope  . Actos [Pioglitazone] Other (See Comments)    Caused blood in urine and fluid retention     Medication list has been reviewed and updated.  Current Outpatient Prescriptions on File Prior to Visit  Medication Sig Dispense Refill  . ACCU-CHEK FASTCLIX LANCETS MISC Test blood sugar once daily. Dx code: E11.8 100 each 3  . acetaminophen (TYLENOL) 500 MG tablet Take 500 mg by mouth every 6 (six) hours as needed for moderate pain or headache.    . albuterol (PROVENTIL HFA;VENTOLIN HFA) 108 (90 BASE) MCG/ACT inhaler Inhale 2 puffs into the lungs every 6 (six) hours as needed for wheezing or shortness of breath. 1 Inhaler 1  . Alcohol Swabs (B-D SINGLE USE SWABS REGULAR) PADS Test blood sugar daily. Dx code: E11.8 100 each 3  . allopurinol (ZYLOPRIM) 100 MG tablet Take 1 tablet (100 mg total) by mouth daily with breakfast. (Patient taking differently: Take 100 mg by mouth 2 (two) times daily. ) 90 tablet 3  . apixaban (ELIQUIS) 5 MG TABS tablet Take 1 tablet (5 mg total) by mouth 2 (two) times daily. 180 tablet 3  . baclofen (LIORESAL) 10 MG tablet TAKE 1 TABLET EVERY 8 HOURS AS NEEDED FOR MUSCLE SPASM(S) 90 tablet 1  . Blood Glucose Calibration (ACCU-CHEK SMARTVIEW CONTROL) LIQD Test blood sugar once daily. Dx code: E11.8. 1 each 3  .  Blood Glucose Monitoring Suppl (ACCU-CHEK NANO SMARTVIEW) W/DEVICE KIT Test blood sugar daily. Dx code: E11.8 1 kit 0  . carvedilol (COREG) 25 MG tablet TAKE 1/2 TABLET TWICE DAILY  WITH  A  MEAL 90 tablet 2  . colchicine 0.6 MG tablet Take 1.2 mg by mouth daily as needed (Gout). Colcrys    . diclofenac (FLECTOR) 1.3 % PTCH Place 1 patch onto the skin 2 (  two) times daily as needed (pain).    Marland Kitchen docusate sodium 100 MG CAPS Take 100 mg by mouth 2 (two) times daily. 10 capsule 0  . glipiZIDE (GLUCOTROL) 5 MG tablet Take 1 tablet (5 mg total) by mouth 2 (two) times daily. 180 tablet 3  . glucose blood (ACCU-CHEK SMARTVIEW) test strip Test blood sugar once daily. Dx code: E11.8 100 each 3  . Hawthorne Berry 550 MG CAPS Take 1,100 mg by mouth 2 (two) times daily.    . hydrALAZINE (APRESOLINE) 50 MG tablet Take 1 tablet (50 mg total) by mouth 3 (three) times daily. 270 tablet 2  . isosorbide mononitrate (IMDUR) 60 MG 24 hr tablet TAKE 1 TABLET (60 MG TOTAL) BY MOUTH DAILY. 90 tablet 2  . meloxicam (MOBIC) 7.5 MG tablet Take 1 tablet (7.5 mg total) by mouth daily. 90 tablet 1  . metolazone (ZAROXOLYN) 5 MG tablet Take 1 tablet (5 mg total) by mouth daily as needed (Swelling). 30 tablet 2  . Misc Natural Products (TURMERIC CURCUMIN) CAPS Take 1 capsule by mouth 3 (three) times daily.    Marland Kitchen oxyCODONE-acetaminophen (PERCOCET) 10-325 MG per tablet Take 1 tablet by mouth 4 (four) times daily. scheduled    . polyethylene glycol powder (GLYCOLAX/MIRALAX) powder MIX 1 CAPFUL (17 GM) IN LIQUID AND DRINK EVERY DAY AS DIRECTED 1700 g 2  . potassium chloride SA (K-DUR,KLOR-CON) 20 MEQ tablet Take 1 tablet (20 mEq total) by mouth daily. 30 tablet 0  . prednisoLONE acetate (PRED FORTE) 1 % ophthalmic suspension Place 1 drop into the right eye 4 (four) times daily. 5 mL 0  . pregabalin (LYRICA) 75 MG capsule Take 1 capsule (75 mg total) by mouth daily. (Patient taking differently: Take 75 mg by mouth 2 (two) times daily. )  90 capsule 3  . selenium 50 MCG TABS tablet Take 200 mcg by mouth daily.    Marland Kitchen spironolactone (ALDACTONE) 25 MG tablet Take 1 tablet (25 mg total) by mouth daily. 90 tablet 3  . torsemide (DEMADEX) 20 MG tablet Take 2 tablets (40 mg total) by mouth 2 (two) times daily. 60 tablet 0  . valACYclovir (VALTREX) 1000 MG tablet Take 1 tablet (1,000 mg total) by mouth 3 (three) times daily. 42 tablet 0  . vitamin B-12 (CYANOCOBALAMIN) 250 MCG tablet Take 250 mcg by mouth daily.     No current facility-administered medications on file prior to visit.    Review of Systems:  As per HPI- otherwise negative.    Physical Examination: Filed Vitals:   07/01/15 1542  BP: 124/82  Pulse: 72  Temp: 97.9 F (36.6 C)  Resp: 20   Filed Vitals:   07/01/15 1542  Height: '5\' 8"'  (1.727 m)  Weight: 240 lb 2 oz (108.92 kg)   Body mass index is 36.52 kg/(m^2). Ideal Body Weight: Weight in (lb) to have BMI = 25: 164.1  GEN: WDWN, NAD, Non-toxic, A & O x 3, obese, looks well.  Here with his sister today HEENT: Atraumatic, Normocephalic. Neck supple. No masses, No LAD. Ears and Nose: No external deformity. CV: RRR, No M/G/R. No JVD. No thrill. No extra heart sounds. PULM: CTA B, no wheezes, crackles, rhonchi. No retractions. No resp. distress. No accessory muscle use. EXTR: No c/c- wearing bilateral compression stockings NEURO Normal gait.  PSYCH: Normally interactive. Conversant. Not depressed or anxious appearing.  Calm demeanor.    Assessment and Plan: Acute on chronic renal failure  CHF exacerbation  Here today to follow-up  CHF exacerbation.  Recently admitted for same.  Had him come back in today as his creat has increased from baseline 2-2.5 to 4.6.  Will need admission for closer monitoring He will be admitted to family practice- they asked me to send pt home and they will call as soon as a tele bed is available.  He understands and will await their call  Signed [name], MD

## 2015-07-02 ENCOUNTER — Encounter (HOSPITAL_COMMUNITY): Payer: Self-pay | Admitting: General Practice

## 2015-07-02 ENCOUNTER — Inpatient Hospital Stay (HOSPITAL_COMMUNITY): Payer: Medicare HMO

## 2015-07-02 DIAGNOSIS — I5023 Acute on chronic systolic (congestive) heart failure: Secondary | ICD-10-CM

## 2015-07-02 DIAGNOSIS — R0600 Dyspnea, unspecified: Secondary | ICD-10-CM

## 2015-07-02 DIAGNOSIS — N179 Acute kidney failure, unspecified: Principal | ICD-10-CM

## 2015-07-02 DIAGNOSIS — Z7901 Long term (current) use of anticoagulants: Secondary | ICD-10-CM

## 2015-07-02 DIAGNOSIS — I10 Essential (primary) hypertension: Secondary | ICD-10-CM

## 2015-07-02 DIAGNOSIS — N183 Chronic kidney disease, stage 3 (moderate): Secondary | ICD-10-CM

## 2015-07-02 DIAGNOSIS — E119 Type 2 diabetes mellitus without complications: Secondary | ICD-10-CM

## 2015-07-02 LAB — BASIC METABOLIC PANEL
Anion gap: 15 (ref 5–15)
Anion gap: 13 (ref 5–15)
BUN: 96 mg/dL — ABNORMAL HIGH (ref 6–20)
BUN: 92 mg/dL — ABNORMAL HIGH (ref 6–20)
CO2: 26 mmol/L (ref 22–32)
CO2: 28 mmol/L (ref 22–32)
Calcium: 9.9 mg/dL (ref 8.9–10.3)
Calcium: 9.7 mg/dL (ref 8.9–10.3)
Chloride: 95 mmol/L — ABNORMAL LOW (ref 101–111)
Chloride: 94 mmol/L — ABNORMAL LOW (ref 101–111)
Creatinine, Ser: 3.77 mg/dL — ABNORMAL HIGH (ref 0.61–1.24)
Creatinine, Ser: 3.48 mg/dL — ABNORMAL HIGH (ref 0.61–1.24)
GFR calc Af Amer: 18 mL/min — ABNORMAL LOW (ref >60)
GFR calc Af Amer: 20 mL/min — ABNORMAL LOW (ref >60)
GFR calc non Af Amer: 15 mL/min — ABNORMAL LOW (ref >60)
GFR calc non Af Amer: 17 mL/min — ABNORMAL LOW (ref >60)
Glucose, Bld: 111 mg/dL — ABNORMAL HIGH (ref 65–99)
Glucose, Bld: 113 mg/dL — ABNORMAL HIGH (ref 65–99)
Potassium: 3.5 mmol/L (ref 3.5–5.1)
Potassium: 3.4 mmol/L — ABNORMAL LOW (ref 3.5–5.1)
Sodium: 136 mmol/L (ref 135–145)
Sodium: 135 mmol/L (ref 135–145)

## 2015-07-02 LAB — GLUCOSE, CAPILLARY
GLUCOSE-CAPILLARY: 110 mg/dL — AB (ref 65–99)
Glucose-Capillary: 108 mg/dL — ABNORMAL HIGH (ref 65–99)
Glucose-Capillary: 117 mg/dL — ABNORMAL HIGH (ref 65–99)
Glucose-Capillary: 119 mg/dL — ABNORMAL HIGH (ref 65–99)

## 2015-07-02 LAB — CBC
HCT: 32 % — ABNORMAL LOW (ref 39.0–52.0)
Hemoglobin: 10.4 g/dL — ABNORMAL LOW (ref 13.0–17.0)
MCH: 27.7 pg (ref 26.0–34.0)
MCHC: 32.5 g/dL (ref 30.0–36.0)
MCV: 85.3 fL (ref 78.0–100.0)
Platelets: 257 10*3/uL (ref 150–400)
RBC: 3.75 MIL/uL — ABNORMAL LOW (ref 4.22–5.81)
RDW: 16.9 % — ABNORMAL HIGH (ref 11.5–15.5)
WBC: 8.3 10*3/uL (ref 4.0–10.5)

## 2015-07-02 LAB — URINALYSIS, ROUTINE W REFLEX MICROSCOPIC
Bilirubin Urine: NEGATIVE
Glucose, UA: NEGATIVE mg/dL
Hgb urine dipstick: NEGATIVE
Ketones, ur: NEGATIVE mg/dL
Leukocytes, UA: NEGATIVE
Nitrite: NEGATIVE
Protein, ur: NEGATIVE mg/dL
Specific Gravity, Urine: 1.008 (ref 1.005–1.030)
Urobilinogen, UA: 0.2 mg/dL (ref 0.0–1.0)
pH: 5.5 (ref 5.0–8.0)

## 2015-07-02 LAB — MAGNESIUM: Magnesium: 2.6 mg/dL — ABNORMAL HIGH (ref 1.7–2.4)

## 2015-07-02 LAB — BRAIN NATRIURETIC PEPTIDE: B Natriuretic Peptide: 1738.2 pg/mL — ABNORMAL HIGH (ref 0.0–100.0)

## 2015-07-02 LAB — PHOSPHORUS: Phosphorus: 4.2 mg/dL (ref 2.5–4.6)

## 2015-07-02 MED ORDER — DOCUSATE SODIUM 100 MG PO CAPS
100.0000 mg | ORAL_CAPSULE | Freq: Two times a day (BID) | ORAL | Status: DC | PRN
Start: 2015-07-02 — End: 2015-07-04
  Filled 2015-07-02: qty 1

## 2015-07-02 MED ORDER — OXYCODONE HCL 5 MG PO TABS
5.0000 mg | ORAL_TABLET | Freq: Four times a day (QID) | ORAL | Status: DC | PRN
  Administered 2015-07-02 – 2015-07-04 (×7): 5 mg via ORAL
  Filled 2015-07-02 (×7): qty 1

## 2015-07-02 MED ORDER — POTASSIUM CHLORIDE CRYS ER 20 MEQ PO TBCR
40.0000 meq | EXTENDED_RELEASE_TABLET | Freq: Once | ORAL | Status: AC
  Administered 2015-07-02: 40 meq via ORAL
  Filled 2015-07-02: qty 2

## 2015-07-02 MED ORDER — OXYCODONE-ACETAMINOPHEN 5-325 MG PO TABS
1.0000 | ORAL_TABLET | Freq: Four times a day (QID) | ORAL | Status: DC | PRN
  Administered 2015-07-02 – 2015-07-04 (×8): 1 via ORAL
  Filled 2015-07-02 (×8): qty 1

## 2015-07-02 NOTE — Consult Note (Signed)
Advanced Heart Failure Team Consult Note  Referring Physician: FPTS Primary Cardiologist:  [place]  Reason for Consultation: Renal failure  HPI:    Frank Davidson is a 64 yo male with a hx of obesity, PAF, HTN, COPD, CKD (baseline creatinine ~2.0), gout, DM 2, CVA and CHF due to NICM with EF 20% (Echo 1/16). Refuses ICD and asked multiple times.   Recently admitted with decompensated HF and shingles. Discharged on 8/2. Weight was 235. Seen at [facility] complaining of ongoing dyspnea, LE edema and weight gain. Labs were checked creatinine 2.03 -> 4.61 BNP 1738 (up from previous 800). Weight 230. Diuretics held and creatinine  Down to 3.48.   Now breathing better.   Review of Systems: [y] = yes, '[ ]'  = no   General: Weight gain Blue.Reese ]; Weight loss '[ ]' ; Anorexia '[ ]' ; Fatigue Blue.Reese ]; Fever '[ ]' ; Chills '[ ]' ; Weakness '[ ]'   Cardiac: Chest pain/pressure '[ ]' ; Resting SOB [ y]; Exertional SOB [ y]; Orthopnea '[ ]' ; Pedal Edema Blue.Reese ]; Palpitations '[ ]' ; Syncope '[ ]' ; Presyncope '[ ]' ; Paroxysmal nocturnal dyspnea'[ ]'   Pulmonary: Cough '[ ]' ; Wheezing'[ ]' ; Hemoptysis'[ ]' ; Sputum '[ ]' ; Snoring '[ ]'   GI: Vomiting'[ ]' ; Dysphagia'[ ]' ; Melena'[ ]' ; Hematochezia '[ ]' ; Heartburn'[ ]' ; Abdominal pain '[ ]' ; Constipation '[ ]' ; Diarrhea '[ ]' ; BRBPR '[ ]'   GU: Hematuria'[ ]' ; Dysuria '[ ]' ; Nocturia'[ ]'   Vascular: Pain in legs with walking '[ ]' ; Pain in feet with lying flat '[ ]' ; Non-healing sores '[ ]' ; Stroke [ y]; TIA '[ ]' ; Slurred speech '[ ]' ;  Neuro: Headaches'[ ]' ; Vertigo'[ ]' ; Seizures'[ ]' ; Paresthesias'[ ]' ;Blurred vision '[ ]' ; Diplopia '[ ]' ; Vision changes '[ ]'   Ortho/Skin: Arthritis Blue.Reese ]; Joint pain [ y]; Muscle pain '[ ]' ; Joint swelling Blue.Reese ]; Back Pain '[ ]' ; Rash '[ ]'   Psych: Depression'[ ]' ; Anxiety'[ ]'   Heme: Bleeding problems '[ ]' ; Clotting disorders '[ ]' ; Anemia '[ ]'   Endocrine: Diabetes Blue.Reese ]; Thyroid dysfunction'[ ]'   Home Medications Prior to Admission medications   Medication Sig Start Date End Date Taking? Authorizing Provider  ACCU-CHEK FASTCLIX  LANCETS MISC Test blood sugar once daily. Dx code: E11.8 12/20/14  Yes Darreld Mclean, MD  acetaminophen (TYLENOL) 500 MG tablet Take 500 mg by mouth every 6 (six) hours as needed for moderate pain or headache.   Yes Historical Provider, MD  albuterol (PROVENTIL HFA;VENTOLIN HFA) 108 (90 BASE) MCG/ACT inhaler Inhale 2 puffs into the lungs every 6 (six) hours as needed for wheezing or shortness of breath. 06/30/15  Yes Gay Filler Copland, MD  Alcohol Swabs (B-D SINGLE USE SWABS REGULAR) PADS Test blood sugar daily. Dx code: E11.8 12/20/14  Yes Darreld Mclean, MD  allopurinol (ZYLOPRIM) 100 MG tablet Take 1 tablet (100 mg total) by mouth daily with breakfast. Patient taking differently: Take 100 mg by mouth 2 (two) times daily.  12/20/14  Yes Gay Filler Copland, MD  apixaban (ELIQUIS) 5 MG TABS tablet Take 1 tablet (5 mg total) by mouth 2 (two) times daily. 02/04/15  Yes Shaune Pascal Bensimhon, MD  baclofen (LIORESAL) 10 MG tablet TAKE 1 TABLET EVERY 8 HOURS AS NEEDED FOR MUSCLE SPASM(S) 03/28/15  Yes Gay Filler Copland, MD  Blood Glucose Calibration (ACCU-CHEK SMARTVIEW CONTROL) LIQD Test blood sugar once daily. Dx code: E11.8. 12/20/14  Yes Darreld Mclean, MD  Blood Glucose Monitoring Suppl (ACCU-CHEK NANO SMARTVIEW) W/DEVICE KIT Test blood sugar daily. Dx code: E11.8 12/20/14  Yes  Gay Filler Copland, MD  carvedilol (COREG) 25 MG tablet TAKE 1/2 TABLET TWICE DAILY  WITH  A  MEAL 04/17/15  Yes Jolaine Artist, MD  colchicine 0.6 MG tablet Take 1.2 mg by mouth daily as needed (Gout). Colcrys   Yes Historical Provider, MD  diclofenac (FLECTOR) 1.3 % PTCH Place 1 patch onto the skin 2 (two) times daily as needed (pain).   Yes Historical Provider, MD  docusate sodium 100 MG CAPS Take 100 mg by mouth 2 (two) times daily. Patient taking differently: Take 100 mg by mouth 2 (two) times daily as needed (constipation).  09/16/14  Yes Matthew Babish, PA-C  glipiZIDE (GLUCOTROL) 5 MG tablet Take 1 tablet (5 mg total) by  mouth 2 (two) times daily. 12/20/14  Yes Jessica C Copland, MD  glucose blood (ACCU-CHEK SMARTVIEW) test strip Test blood sugar once daily. Dx code: E11.8 12/20/14  Yes Gay Filler Copland, MD  hydrALAZINE (APRESOLINE) 50 MG tablet Take 1 tablet (50 mg total) by mouth 3 (three) times daily. 01/03/15  Yes Jolaine Artist, MD  isosorbide mononitrate (IMDUR) 60 MG 24 hr tablet TAKE 1 TABLET (60 MG TOTAL) BY MOUTH DAILY. 01/03/15  Yes Jolaine Artist, MD  oxyCODONE-acetaminophen (PERCOCET) 10-325 MG per tablet Take 1 tablet by mouth 4 (four) times daily. scheduled 11/16/14  Yes Historical Provider, MD  polyethylene glycol powder (GLYCOLAX/MIRALAX) powder MIX 1 CAPFUL (17 GM) IN LIQUID AND DRINK EVERY DAY AS DIRECTED 03/28/15  Yes Gay Filler Copland, MD  potassium chloride SA (K-DUR,KLOR-CON) 20 MEQ tablet Take 1 tablet (20 mEq total) by mouth daily. 06/30/15  Yes Gay Filler Copland, MD  prednisoLONE acetate (PRED FORTE) 1 % ophthalmic suspension Place 1 drop into the right eye 4 (four) times daily. Patient taking differently: Place 1 drop into both eyes 4 (four) times daily.  06/24/15  Yes Barton Dubois, MD  spironolactone (ALDACTONE) 25 MG tablet Take 1 tablet (25 mg total) by mouth daily. 03/24/15  Yes Jolaine Artist, MD  torsemide (DEMADEX) 20 MG tablet Take 2 tablets (40 mg total) by mouth 2 (two) times daily. 06/30/15  Yes Gay Filler Copland, MD  valACYclovir (VALTREX) 1000 MG tablet Take 1 tablet (1,000 mg total) by mouth 3 (three) times daily. Patient not taking: Reported on 07/02/2015 06/24/15   Barton Dubois, MD    Past Medical History: Past Medical History  Diagnosis Date  . Atrial fibrillation     PAF  . Hypertension   . COPD (chronic obstructive pulmonary disease)   . Ocular herpes   . Gout   . CHF (congestive heart failure)   . Chronic pain   . Hyperlipidemia   . Nonischemic cardiomyopathy Aug 2015    EF 15%  . Ocular herpes   . Neuropathy   . Left-sided weakness     "because of the  arthritis and edema"  . PVD (peripheral vascular disease) May 2015    abnormal ABIs  . Memory impairment     "short term"  . CKD (chronic kidney disease), stage III     Dr. Mercy Moore follows-holding   . Fibromyalgia     neuropathy- hands, more than feet.  . Sleep apnea with use of continuous positive airway pressure (CPAP)     does not use cpap  . GERD (gastroesophageal reflux disease)     tums as needed  . Asthma   . Type II diabetes mellitus   . Stroke X 5    last stroke aug 2014"; denies  residual on 09/12/2014  . Osteoarthritis   . Arthritis     "knees, right elbow, shoulder" (09/12/2014)  . Shortness of breath dyspnea     Past Surgical History: Past Surgical History  Procedure Laterality Date  . 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 Tonsillectomy  age 70  . Esophagogastroduodenoscopy N/A 12/24/2013    Procedure: ESOPHAGOGASTRODUODENOSCOPY (EGD);  Surgeon: Inda Castle, MD;  Location: [facility];  Service: Endoscopy;  Laterality: N/A;  . Colonoscopy N/A 12/24/2013    Procedure: COLONOSCOPY;  Surgeon: Inda Castle, MD;  Location: [facility];  Service: Endoscopy;  Laterality: N/A;  . Joint replacement    . Cardiac catheterization  10/2006    normal coronary arteries;   Marland Kitchen Cataract extraction w/ intraocular lens  implant, bilateral Bilateral ~ 2008  . Total hip arthroplasty Left 09/12/2014    Procedure: LEFT TOTAL HIP ARTHROPLASTY ANTERIOR APPROACH;  Surgeon: Mauri Pole, MD;  Location: [place];  Service: Orthopedics;  Laterality: Left;  . Right heart catheterization N/A 08/13/2014    Rt ht cath prior to hip surgery    Family History: Family History  Problem Relation Age of Onset  . Cancer Mother   . Hypertension Mother   . Lung disease Mother   . Diabetes Father   . Diabetes Sister   . Hypertension Sister   . Hypertension Brother   . Hypertension Sister   . Colon cancer Neg Hx     Social History: Social History   Social History  . Marital Status:  Divorced    Spouse Name: N/A  . Number of Children: N/A  . Years of Education: N/A   Social History Main Topics  . Smoking status: Former Smoker -- 0.00 packs/day for 0 years    Types: Cigarettes    Quit date: 12/18/2006  . Smokeless tobacco: Never Used  . Alcohol Use: No     Comment: Quit 2008  . Drug Use: Yes    Special: Cocaine, Marijuana     Comment: last used marijuana and cocaine 2008  . Sexual Activity: Not Asked   Other Topics Concern  . None   Social History Narrative    Allergies:  Allergies  Allergen Reactions  . Corlanor [Ivabradine] Palpitations and Other (See Comments)    Kidney and heart problems, syncope  . Januvia [Sitagliptin] Other (See Comments)    "Almost killed me"   . Lunesta [Eszopiclone] Palpitations and Other (See Comments)    Kidney and heart problems, syncope  . Actos [Pioglitazone] Other (See Comments)    Caused blood in urine and fluid retention     Objective:    Vital Signs:   Temp:  [97.8 F (36.6 C)-98.2 F (36.8 C)] 97.9 F (36.6 C) (08/10 1400) Pulse Rate:  [70-76] 71 (08/10 1400) Resp:  [14-20] 18 (08/10 1400) BP: (108-126)/(67-76) 112/70 mmHg (08/10 1400) SpO2:  [97 %-98 %] 98 % (08/10 1400) Weight:  [104.327 kg (230 lb)-107.729 kg (237 lb 8 oz)] 104.327 kg (230 lb) (08/10 0532) Last BM Date: 07/01/15  Weight change: Filed Weights   07/01/15 2145 07/02/15 0532  Weight: 106.187 kg (234 lb 1.6 oz) 104.327 kg (230 lb)    Intake/Output:   Intake/Output Summary (Last 24 hours) at 07/02/15 1815 Last data filed at 07/02/15 1800  Gross per 24 hour  Intake    720 ml  Output   1950 ml  Net  -1230 ml     Physical Exam: General: Sitting up  on side of bed. Feels good NAD HEENT: normal Neck: supple. JVP does not appear elevated. Carotids 2+ bilat; no bruits. No lymphadenopathy or thryomegaly appreciated. Cor: PMI laterally displaced. Iregular rate & rhythm. 2/6 MR Lungs: clear Abdomen: obese, soft, nontender,  nondistended. No hepatosplenomegaly. No bruits or masses. Good bowel sounds. Extremities: no cyanosis, clubbing, rash, edema Neuro: alert & orientedx3, cranial nerves grossly intact. moves all 4 extremities w/o difficulty. Affect pleasant  Telemetry: A fib 60-70s   Labs: Basic Metabolic Panel:  Recent Labs Lab 06/30/15 1618 07/02/15 0015 07/02/15 0356  NA 133* 136 135  K 4.1 3.5 3.4*  CL 95* 95* 94*  CO2 '21 26 28  ' GLUCOSE 88 111* 113*  BUN 82* 96* 92*  CREATININE 4.61* 3.77* 3.48*  CALCIUM 9.5 9.9 9.7  MG  --   --  2.6*  PHOS  --   --  4.2    Liver Function Tests: No results for input(s): AST, ALT, ALKPHOS, BILITOT, PROT, ALBUMIN in the last 168 hours. No results for input(s): LIPASE, AMYLASE in the last 168 hours. No results for input(s): AMMONIA in the last 168 hours.  CBC:  Recent Labs Lab 07/02/15 0356  WBC 8.3  HGB 10.4*  HCT 32.0*  MCV 85.3  PLT 257    Cardiac Enzymes: No results for input(s): CKTOTAL, CKMB, CKMBINDEX, TROPONINI in the last 168 hours.  BNP: BNP (last 3 results)  Recent Labs  05/19/15 1229 06/18/15 1833 07/02/15 0015  BNP 343.6* 823.0* 1738.2*    ProBNP (last 3 results)  Recent Labs  08/08/14 1116 10/19/14 2158 10/25/14 1048  PROBNP 984.5* 1637.0* 3555.0*     CBG:  Recent Labs Lab 07/02/15 0041 07/02/15 0553 07/02/15 1115 07/02/15 1604  GLUCAP 108* 117* 110* 119*    Coagulation Studies: No results for input(s): LABPROT, INR in the last 72 hours.  Other results:  Imaging: X-ray Chest Pa And Lateral  07/02/2015   CLINICAL DATA:  Increasing SOB last few days, hard time catching breath - ?fluid, acute exacerbation of CHF - hx of CHF, COPD, AFIB, PVD, chronic kidney disease, sleep apnea, stroke  EXAM: CHEST  2 VIEW  COMPARISON:  06/30/2015  FINDINGS: There is moderate enlargement of the cardiopericardial silhouette. No mediastinal or hilar masses or evidence of adenopathy.  Lungs show stable areas of reticular  scarring. There is no lung consolidation or evidence of edema. No pleural effusion or pneumothorax.  Bony thorax is intact. There are bridging osteophytes along the mid and lower thoracic spine.  IMPRESSION: 1. No acute cardiopulmonary disease. 2. Moderate cardiomegaly, stable.   Electronically Signed   By: Lajean Manes M.D.   On: 07/02/2015 10:10      Medications:     Current Medications: . apixaban  5 mg Oral BID  . aspirin EC  81 mg Oral Daily  . carvedilol  12.5 mg Oral BID WC  . insulin aspart  0-5 Units Subcutaneous QHS  . insulin aspart  0-9 Units Subcutaneous TID WC  . isosorbide mononitrate  60 mg Oral Daily  . polyethylene glycol  17 g Oral Daily  . prednisoLONE acetate  1 drop Both Eyes QID  . pregabalin  25 mg Oral BID  . sodium chloride  3 mL Intravenous Q12H  . sodium chloride  3 mL Intravenous Q12H  . spironolactone  25 mg Oral Daily  . valACYclovir  1,000 mg Oral TID     Infusions:      Assessment:   1. Acute  on chronic renal failure 2. Chronic systolic HF EF 56% 3. Chronic AF 4. DM2   Plan/Discussion:     He has been overdiuresed. Renal function improving with holding diuretics. Would continue to hold probably for 2 more days and then resume. Will hold spiro too.  Continue apixaban for AF. Should be able to go home soon. We will follow with you.  Length of Stay: 1  Bensimhon, Daniel MD 07/02/2015, 6:15 PM  Advanced Heart Failure Team Pager [phone] (M-F; [place])  Please contact [facility] for night-coverage after hours (4p -7a ) and weekends on [email or web address]

## 2015-07-02 NOTE — Progress Notes (Signed)
Family Medicine Teaching Service Daily Progress Note Intern Pager: [phone]  Patient name: Frank Davidson Medical record number: [id number] Date of birth: November 22, 1951 Age: 64 y.o. Gender: male  Primary Care Provider: Abbe Amsterdam, MD Consultants: Cardiology  Code Status: Full   Pt Overview and Major Events to Date:    Assessment and Plan: Frank Davidson is a 64 y.o. male presenting as direct admit from [facility] for significantly elevated creatinine consistent with Acute on Chronic Renal Failure, also continued dyspnea, wt gain, LE edema, with recent CHF exacerbation (hospitalized from 7/27 to 8/3). PMH is significant for Systolic CHF with NICM (HFrEF with last EF 20%, 11/2014), PAF on chronic anticoagulation, HTN, HLD, DM2 controlled, h/o CVA, COPD, gout, chronic pain syndrome with arthritis / fibromyalgia.  # AoCKD-III, in setting of recent aggressive diuresis for CHF exacerbation. In Acute care clinic SCr  4.6 (baseline 2-2.5, recently 2 on 06/24/15). Was voiding well on aggressive home PO diuresis for CHF. Consider cardiorenal syndrome vs. Diuresis.  UA with negative protein, hgb or casts.  - Trend daily BMET, -  SCr 3.48  - Will stop spirolactone due CKDIII now and on discharge  - If SCr worsens will consider renal US, Nephrology consult  # Acute on Chronic Systolic CHF, with persistent dyspnea / LE edema. ECHO 11/2014 with reduced EF 20%, ICD declined in the past. Previous hospitalization 7/27 to 8/3 for acute CHF. Patient diruesised at home metolazone, Torsemide, and spiro. Current symptoms of  dyspnea, 1+ LE edema to mid-calf, however wt down trending.  Weight on admissions 234lbs (Dry wt 230-235).  - Vitals  Stable  - BNP 1738.2 (baseline 800-300)  - Consult Cardiology this AM  - Strict I/O, wt 230lbs this AM< 234lbs   - Holding toresmide and metalozone - Pending CXR this AM  - K+ 3.4 this AM, replete with 40 meq,  Mag /Phos - 2.6/4.2  # DM2, controlled - Last A1c 5.6 (12/2014),  diet controlled and on glipizide.  - Hold home glipizide - Sensitive SSI and check CBG q AC/HS protocol  # PAF, on chronic anticoagulation - Continue home Eliquis 5mg  BID, start dosing tomorrow 8/10 (already received home doses today)  # HTN, controlled - Stable BP, This AM BP 108/76 - Holding Hydralazine due to low BP   # Chronic Pain Syndrome / Arthritis / Fibromyalgia - Involvement of multiple sites / joints, with chronic pain at baseline. No exacerbation. - Continue home Percocet 10/325 q 6 hr PRN - Continue home bowel regimen - Miralax 17g daily, Colace 100mg  BID - reduced Lyrica to 25mg  BID (from 75, for renal dosing)  # H/o gout - Holding allopurinol given AoCKD  # HZO - Continue Acyclovir 1000mg  TID and Prednisone eye drops BID as prescribed - Follow-up with Ophtho on discharge  FEN/GI: SLIV / Heart healthy-Carb mod diet  Prophylaxis: Continue chronic anticoagulation - Eliquis (completed home doses 8/9, start 8/10)  Disposition: Telemetry, Dispo home with resolving AKI and adequate diuresis   Subjective:  Patient feeling dyspneic this morning, and feels like his legs are a bit swollen. However overall, patient feels that he his imrpoving. States that since last admission has not been able to walk very far without dyspnea, and needing to rest. Positive for Orthophea but no paroxysmal dyspnea. Denies chest pain, n/v, fever or chills   Objective: Temp:  [97.8 F (36.6 C)-98.2 F (36.8 C)] 98.2 F (36.8 C) (08/10 0532) Pulse Rate:  [70-76] 76 (08/10 0532) Resp:  [14-20] 18 (08/10 0532)  BP: (108-126)/(67-82) 108/76 mmHg (08/10 0532) SpO2:  [94 %-98 %] 98 % (08/10 0532) Weight:  [104.327 kg (230 lb)-108.92 kg (240 lb 2 oz)] 104.327 kg (230 lb) (08/10 0532) Physical Exam: General: sitting up in bed, NAD  Cardiovascular: RRR, no m/r/g, 2+ DP and radial pulses, no JVD  Respiratory:  Increased WOB, CTAB Abdomen: BS+, NTND, hepatomegaly  Extremities: mid-calf 1+ pitting  edema with Ted hose in place  Laboratory:  Recent Labs Lab 07/02/15 0356  WBC 8.3  HGB 10.4*  HCT 32.0*  PLT 257    Recent Labs Lab 06/30/15 1618 07/02/15 0015 07/02/15 0356  NA 133* 136 135  K 4.1 3.5 3.4*  CL 95* 95* 94*  CO2 BUN 82* 96* 92*  CREATININE 4.61* 3.77* 3.48*  CALCIUM 9.5 9.9 9.7  GLUCOSE 88 111* 113*    Imaging/Diagnostic Tests:   Frank Fecteau Mayra Reel, MD 07/02/2015, 7:22 AM PGY-1, [facility] Family Medicine FPTS Intern pager: [phone], text pages welcome

## 2015-07-02 NOTE — Consult Note (Signed)
   [facility] CM Inpatient Consult   07/02/2015  Frank Davidson 1951/06/08 [id number] Follow up:  Was able to meet with the patient.  Patient endorses that he would like to continue to have follow up with [facility] Care Management services.  Patient endorses that he would like his niece or sister be called as well, if he does not answer his phone, states, "they can always relay a message to me."  Their contact information is written on his consent form and verified that all of the numbers are accurate.  Patient will receive follow up calls and be assessed for home visits after discharge.  [facility] Care Management does not replace or interfere with any services arranged for this patient.  For questions, please contact: Charlesetta Shanks, RN BSN CCM [facility]  [phone] business mobile phone

## 2015-07-02 NOTE — Progress Notes (Signed)
Pt a/o, c/o ankle pain, PRN Percocet given as ordered, VSS, pt stable

## 2015-07-02 NOTE — Consult Note (Signed)
   [facility] Inpatient Consult   07/02/2015  Frank Davidson 18-Oct-1951 [id number] Patient has recently consented to [facility] Care Management for chronic disease management services. However, the nurse has been unable to keep contact with the patient. Came by the patient's room to determine the barriers for post hospital follow up but, unfortunately the patient was not in the room for testing.  Patient has been engaged by a [employer].   Our community based plan of care has focused on disease management and community resource support.  Patient will receive a post discharge transition of care call and will be evaluated for monthly home visits for assessments and disease process education.  Made Inpatient Case Manager aware that [facility] Care Management following. Of note, [facility] Care Management services does not replace or interfere with any services that are arranged by inpatient case management or social work.  For additional questions or referrals please contact: Charlesetta Shanks, RN BSN CCM [facility]  [phone] business mobile phone

## 2015-07-03 ENCOUNTER — Inpatient Hospital Stay (HOSPITAL_COMMUNITY): Payer: Medicare HMO

## 2015-07-03 LAB — BASIC METABOLIC PANEL
Anion gap: 12 (ref 5–15)
BUN: 82 mg/dL — ABNORMAL HIGH (ref 6–20)
CHLORIDE: 92 mmol/L — AB (ref 101–111)
CO2: 28 mmol/L (ref 22–32)
Calcium: 9.3 mg/dL (ref 8.9–10.3)
Creatinine, Ser: 2.55 mg/dL — ABNORMAL HIGH (ref 0.61–1.24)
GFR calc non Af Amer: 25 mL/min — ABNORMAL LOW (ref >60)
GFR, EST AFRICAN AMERICAN: 29 mL/min — AB (ref >60)
Glucose, Bld: 116 mg/dL — ABNORMAL HIGH (ref 65–99)
Potassium: 3.6 mmol/L (ref 3.5–5.1)
Sodium: 132 mmol/L — ABNORMAL LOW (ref 135–145)

## 2015-07-03 LAB — GLUCOSE, CAPILLARY
GLUCOSE-CAPILLARY: 154 mg/dL — AB (ref 65–99)
GLUCOSE-CAPILLARY: 124 mg/dL — AB (ref 65–99)
Glucose-Capillary: 148 mg/dL — ABNORMAL HIGH (ref 65–99)
Glucose-Capillary: 112 mg/dL — ABNORMAL HIGH (ref 65–99)
Glucose-Capillary: 146 mg/dL — ABNORMAL HIGH (ref 65–99)

## 2015-07-03 LAB — CBC
HEMATOCRIT: 33.3 % — AB (ref 39.0–52.0)
Hemoglobin: 11 g/dL — ABNORMAL LOW (ref 13.0–17.0)
MCH: 28.9 pg (ref 26.0–34.0)
MCHC: 33 g/dL (ref 30.0–36.0)
MCV: 87.4 fL (ref 78.0–100.0)
Platelets: 244 10*3/uL (ref 150–400)
RBC: 3.81 MIL/uL — ABNORMAL LOW (ref 4.22–5.81)
RDW: 17.5 % — AB (ref 11.5–15.5)
WBC: 7.4 10*3/uL (ref 4.0–10.5)

## 2015-07-03 MED ORDER — TORSEMIDE 20 MG PO TABS
40.0000 mg | ORAL_TABLET | Freq: Once | ORAL | Status: AC
  Administered 2015-07-03: 40 mg via ORAL
  Filled 2015-07-03: qty 2

## 2015-07-03 MED ORDER — PROMETHAZINE HCL 12.5 MG PO TABS
12.5000 mg | ORAL_TABLET | Freq: Once | ORAL | Status: AC
  Administered 2015-07-03: 12.5 mg via ORAL
  Filled 2015-07-03 (×2): qty 1

## 2015-07-03 MED ORDER — POTASSIUM CHLORIDE CRYS ER 20 MEQ PO TBCR
40.0000 meq | EXTENDED_RELEASE_TABLET | Freq: Once | ORAL | Status: AC
  Administered 2015-07-03: 40 meq via ORAL
  Filled 2015-07-03: qty 2

## 2015-07-03 NOTE — Progress Notes (Signed)
Assisted pt walking the hallway  with O2 sat at 94-99% on room air, got SOB after walking but O2 sat remains on 96%, denies nausea an dizziness, denies chest pain. " It feels good walking the hallway" verbalized by the pt.

## 2015-07-03 NOTE — Discharge Instructions (Addendum)
You were hospitalized at [facility] for exacerbation of your kidney's and your heart. You have continued to have improving kidney function, and improving symptoms of heart failure.  Information on my medicine - ELIQUIS (apixaban)  This medication education was reviewed with me or my healthcare representative as part of my discharge preparation.  The pharmacist that spoke with me during my hospital stay was:  Almon Hercules, [facility]  Why was Eliquis prescribed for you? Eliquis was prescribed for you to reduce the risk of a blood clot forming that can cause a stroke if you have a medical condition called atrial fibrillation (a type of irregular heartbeat).  What do You need to know about Eliquis ? Take your Eliquis TWICE DAILY - one tablet in the morning and one tablet in the evening with or without food. If you have difficulty swallowing the tablet whole please discuss with your pharmacist how to take the medication safely.  Take Eliquis exactly as prescribed by your doctor and DO NOT stop taking Eliquis without talking to the doctor who prescribed the medication.  Stopping may increase your risk of developing a stroke.  Refill your prescription before you run out.  After discharge, you should have regular check-up appointments with your healthcare provider that is prescribing your Eliquis.  In the future your dose may need to be changed if your kidney function or weight changes by a significant amount or as you get older.  What do you do if you miss a dose? If you miss a dose, take it as soon as you remember on the same day and resume taking twice daily.  Do not take more than one dose of ELIQUIS at the same time to make up a missed dose.  Important Safety Information A possible side effect of Eliquis is bleeding. You should call your healthcare provider right away if you experience any of the following: ? Bleeding from an injury or your nose that does not stop. ? Unusual colored urine (red  or dark brown) or unusual colored stools (red or black). ? Unusual bruising for unknown reasons. ? A serious fall or if you hit your head (even if there is no bleeding).  Some medicines may interact with Eliquis and might increase your risk of bleeding or clotting while on Eliquis. To help avoid this, consult your healthcare provider or pharmacist prior to using any new prescription or non-prescription medications, including herbals, vitamins, non-steroidal anti-inflammatory drugs (NSAIDs) and supplements.  This website has more information on Eliquis (apixaban): http://www.eliquis.com/eliquis/home

## 2015-07-03 NOTE — Progress Notes (Signed)
UR completed 

## 2015-07-03 NOTE — Progress Notes (Addendum)
FPTS Interim Progress Note  S:Patient complaining of a couple of hours of dizziness and nausea. Denies chest pain.   O: BP 131/75 mmHg  Pulse 82  Temp(Src) 98 F (36.7 C) (Oral)  Resp 18  Ht 5\' 8"  (1.727 m)  Wt 231 lb 14.4 oz (105.189 kg)  BMI 35.27 kg/m2  SpO2 93%   Physical Exam  Cardio: RRR, Lower extremity edema 1+ to mid-calf (stable)  Lungs: CTAB, no crackles, wheezes Neuro: CN2-12 intact, Upper/Lower extremity strength 5/5 equal bilaterally, Finger to nose test unremarkable   A/P: Dizziness/Nausea in the setting of CHF with EF 20% and no ICD. Currently on telemetry. No acute neurologic changes  - EKG  - 12.5 Phenergan PO for Nausea - Orthostatics once    Frank Davidson Mayra Reel, MD 07/03/2015, 5:12 PM PGY-1, [facility] Family Medicine Service pager [phone]

## 2015-07-03 NOTE — Progress Notes (Signed)
Family Medicine Teaching Service Daily Progress Note Intern Pager: [phone]  Patient name: Frank Davidson Medical record number: [id number] Date of birth: 10/07/1951 Age: 64 y.o. Gender: male  Primary Care Provider: Abbe Amsterdam, MD Consultants: Cardiology  Code Status: Full   Pt Overview and Major Events to Date:    Assessment and Plan: DEION SWIFT is a 64 y.o. male presenting as direct admit from [facility] for significantly elevated creatinine consistent with Acute on Chronic Renal Failure, also continued dyspnea, wt gain, LE edema, with recent CHF exacerbation (hospitalized from 7/27 to 8/3). PMH is significant for Systolic CHF with NICM (HFrEF with last EF 20%, 11/2014), PAF on chronic anticoagulation, HTN, HLD, DM2 controlled, h/o CVA, COPD, gout, chronic pain syndrome with arthritis / fibromyalgia.  # AoCKD-III, in setting of recent aggressive diuresis for CHF exacerbation. In Acute care clinic SCr  4.6 (baseline 2-2.5, recently 2 on 06/24/15). Was voiding well on aggressive home PO diuresis for CHF. Consider cardiorenal syndrome vs. overdiuresis.  UA with negative protein, hgb or casts.  - HFT consulted likely kidney function due to overdiruesis, appreciate recs - Trend daily BMET, -  SCr 2.55 < SCr 3.48 - Will stop spirolactone due CKDIII now and on discharge   # Acute on Chronic Systolic CHF. ECHO 11/2014 with reduced EF 20%, ICD declined in the past. Previous hospitalization 7/27 to 8/3 for acute CHF. Patient diruesised at home metolazone, Torsemide, and spiro. Current symptoms  Dyspnea being fair to improved, 1+ LE edema to mid-calf.  Weight on admissions 234lbs (Dry wt 230-235). BNP 1738.2 (baseline 800-300) - Vitals Stable  - Strict I/O, wt 231lbs 14 oz this AM - Continue holding toresmide and metalozone for 2 days, restart 8/12 - K+ 3.6 this AM, replete with another 40 meq to K+ 4.0 this AM,  Mag Loleta Dicker - 2.6/4.2  # DM2, controlled - CBG FS 148, 112,  - Last A1c 5.6  (12/2014), diet controlled and on glipizide.  - Hold home glipizide - Sensitive SSI and check CBG q AC/HS protocol  # PAF, on chronic anticoagulation - Continue home Eliquis  BID, start dosing tomorrow 8/10 (already received home doses today)  # HTN, controlled - Stable BP, This AM BP 112/77 - Holding Hydralazine due to low BP   # Chronic Pain Syndrome / Arthritis / Fibromyalgia - Involvement of multiple sites / joints, with chronic pain at baseline. No exacerbation. - Continue home Percocet 10/325 q 6 hr PRN - Continue home bowel regimen - Miralax 17g daily, Colace  BID - reduced Lyrica to  BID (from 75, for renal dosing)  # H/o gout - Holding allopurinol given AoCKD  # HZO - Continue Acyclovir  TID and Prednisone eye drops BID as prescribed - Follow-up with Ophtho on discharge  FEN/GI: SLIV / Heart healthy-Carb mod diet  Prophylaxis: Continue chronic anticoagulation - Eliquis (completed home doses 8/9, start 8/10)  Disposition: Telemetry, Dispo home with resolving AKI and adequate diuresis   Subjective:  Patient feeling overall improved this AM. He states dyspnea is "fair." Denies chest pain, n/v, chills   Objective: Temp:  [97.6 F (36.4 C)-98 F (36.7 C)] 98 F (36.7 C) (08/11 0620) Pulse Rate:  [63-71] 63 (08/11 0620) Resp:  [18] 18 (08/11 0620) BP: (112-120)/(70-81) 112/77 mmHg (08/11 0620) SpO2:  [96 %-98 %] 96 % (08/11 0620) Weight:  [231 lb 14.4 oz (105.189 kg)] 231 lb 14.4 oz (105.189 kg) (08/11 1478)  Physical Exam: General: sitting up in bed, NAD  Cardiovascular:  RRR, no m/r/g, 2+ DP and radial pulses, no JVD  Respiratory: Normal work of  breathing, CTAB Abdomen: BS+, NTND, Extremities: RLE> LLE. mid-calf 1+ pitting edema  Skin - scar of face from HZO   Laboratory:  Recent Labs Lab 07/02/15 0356 07/03/15 0547  WBC 8.3 7.4  HGB 10.4* 11.0*  HCT 32.0* 33.3*  PLT 257 244    Recent Labs Lab 07/02/15 0015 07/02/15 0356  07/03/15 0547  NA 136 135 132*  K 3.5 3.4* 3.6  CL 95* 94* 92*  CO2 26 28 28   BUN 96* 92* 82*  CREATININE 3.77* 3.48* 2.55*  CALCIUM 9.9 9.7 9.3  GLUCOSE 111* 113* 116*    Imaging/Diagnostic Tests:  X-ray Chest Pa And Lateral  07/02/2015   CLINICAL DATA:  Increasing SOB last few days, hard time catching breath - ?fluid, acute exacerbation of CHF - hx of CHF, COPD, AFIB, PVD, chronic kidney disease, sleep apnea, stroke  EXAM: CHEST  2 VIEW  COMPARISON:  06/30/2015  FINDINGS: There is moderate enlargement of the cardiopericardial silhouette. No mediastinal or hilar masses or evidence of adenopathy.  Lungs show stable areas of reticular scarring. There is no lung consolidation or evidence of edema. No pleural effusion or pneumothorax.  Bony thorax is intact. There are bridging osteophytes along the mid and lower thoracic spine.  IMPRESSION: 1. No acute cardiopulmonary disease. 2. Moderate cardiomegaly, stable.   Electronically Signed   By: Amie Portland M.D.   On: 07/02/2015 10:10   Dg Chest 2 View  06/30/2015   CLINICAL DATA:  Recent episode of congestive heart failure.  EXAM: CHEST  2 VIEW  COMPARISON:  PA and lateral chest 06/18/2015 and 11/23/2014.  FINDINGS: Again seen is marked cardiomegaly. No consolidative process, pulmonary edema or pneumothorax identified. No pleural effusion.  IMPRESSION: Marked cardiomegaly without acute disease.   Electronically Signed   By: Drusilla Kanner M.D.   On: 06/30/2015 17:43   Dg Chest 2 View  06/18/2015   CLINICAL DATA:  Dyspnea.  EXAM: CHEST  2 VIEW  COMPARISON:  02/19/2015  FINDINGS: There is hyperinflation and marked cardiomegaly. There is mild aortic tortuosity. There is mild linear scarring in the mid lung are bilaterally. No confluent airspace consolidation. No large effusion. Pulmonary vasculature is normal.  IMPRESSION: Marked cardiomegaly.  Hyperinflation.  Stable linear scarring.   Electronically Signed   By: Ellery Plunk M.D.   On: 06/18/2015  18:09   Symphany Fleissner Mayra Reel, MD 07/03/2015, 9:57 AM PGY-1, [place] [facility] FPTS Intern pager: [phone], text pages welcome

## 2015-07-03 NOTE — Progress Notes (Signed)
Pt a/o, c/o pain, PRN pain meds as ordered, pt independent, VSS, pt stable

## 2015-07-03 NOTE — Consult Note (Signed)
Advanced Heart Failure Team Progress Note   Subjective    Renal function improving. Weight up a pound this am. Slightly more dyspneic.   Objective:    Vital Signs:   Temp:  [97.6 F (36.4 C)-98 F (36.7 C)] 98 F (36.7 C) (08/11 1512) Pulse Rate:  [63-82] 82 (08/11 1512) Resp:  [18] 18 (08/11 0620) BP: (112-131)/(75-81) 131/75 mmHg (08/11 1512) SpO2:  [93 %-98 %] 93 % (08/11 1512) Weight:  [105.189 kg (231 lb 14.4 oz)] 105.189 kg (231 lb 14.4 oz) (08/11 0619) Last BM Date: 07/02/15  Weight change: Filed Weights   07/01/15 2145 07/02/15 0532 07/03/15 0619  Weight: 106.187 kg (234 lb 1.6 oz) 104.327 kg (230 lb) 105.189 kg (231 lb 14.4 oz)    Intake/Output:   Intake/Output Summary (Last 24 hours) at 07/03/15 1926 Last data filed at 07/03/15 1815  Gross per 24 hour  Intake    960 ml  Output   1360 ml  Net   -400 ml     Physical Exam: General: Sitting up on side of bed. Slightly dyspneic HEENT: normal Neck: supple. JVP 8-9. Carotids 2+ bilat; no bruits. No lymphadenopathy or thryomegaly appreciated. Cor: PMI laterally displaced. Irregular rate & rhythm. 2/6 MR Lungs: clear Abdomen: obese, soft, nontender, nondistended. No hepatosplenomegaly. No bruits or masses. Good bowel sounds. Extremities: no cyanosis, clubbing, rash, trace edema Neuro: alert & orientedx3, cranial nerves grossly intact. moves all 4 extremities w/o difficulty. Affect pleasant  Telemetry: A fib 60-70s   Labs: Basic Metabolic Panel:  Recent Labs Lab 06/30/15 1618 07/02/15 0015 07/02/15 0356 07/03/15 0547  NA 133* 136 135 132*  K 4.1 3.5 3.4* 3.6  CL 95* 95* 94* 92*  CO2 GLUCOSE 88 111* 113* 116*  BUN 82* 96* 92* 82*  CREATININE 4.61* 3.77* 3.48* 2.55*  CALCIUM 9.5 9.9 9.7 9.3  MG  --   --  2.6*  --   PHOS  --   --  4.2  --     Liver Function Tests: No results for input(s): AST, ALT, ALKPHOS, BILITOT, PROT, ALBUMIN in the last 168 hours. No results for input(s):  LIPASE, AMYLASE in the last 168 hours. No results for input(s): AMMONIA in the last 168 hours.  CBC:  Recent Labs Lab 07/02/15 0356 07/03/15 0547  WBC 8.3 7.4  HGB 10.4* 11.0*  HCT 32.0* 33.3*  MCV 85.3 87.4  PLT 257 244    Cardiac Enzymes: No results for input(s): CKTOTAL, CKMB, CKMBINDEX, TROPONINI in the last 168 hours.  BNP: BNP (last 3 results)  Recent Labs  05/19/15 1229 06/18/15 1833 07/02/15 0015  BNP 343.6* 823.0* 1738.2*    ProBNP (last 3 results)  Recent Labs  08/08/14 1116 10/19/14 2158 10/25/14 1048  PROBNP 984.5* 1637.0* 3555.0*     CBG:  Recent Labs Lab 07/02/15 1604 07/02/15 2109 07/03/15 0622 07/03/15 1154 07/03/15 1705  GLUCAP 119* 148* 112* 154* 146*    Coagulation Studies: No results for input(s): LABPROT, INR in the last 72 hours.  Other results:  Imaging: X-ray Chest Pa And Lateral  07/02/2015   CLINICAL DATA:  Increasing SOB last few days, hard time catching breath - ?fluid, acute exacerbation of CHF - hx of CHF, COPD, AFIB, PVD, chronic kidney disease, sleep apnea, stroke  EXAM: CHEST  2 VIEW  COMPARISON:  06/30/2015  FINDINGS: There is moderate enlargement of the cardiopericardial silhouette. No mediastinal or hilar masses or evidence of adenopathy.  Lungs  show stable areas of reticular scarring. There is no lung consolidation or evidence of edema. No pleural effusion or pneumothorax.  Bony thorax is intact. There are bridging osteophytes along the mid and lower thoracic spine.  IMPRESSION: 1. No acute cardiopulmonary disease. 2. Moderate cardiomegaly, stable.   Electronically Signed   By: Amie Portland M.D.   On: 07/02/2015 10:10     Medications:     Current Medications: . apixaban  5 mg Oral BID  . aspirin EC  81 mg Oral Daily  . carvedilol  12.5 mg Oral BID WC  . insulin aspart  0-5 Units Subcutaneous QHS  . insulin aspart  0-9 Units Subcutaneous TID WC  . isosorbide mononitrate  60 mg Oral Daily  . polyethylene  glycol  17 g Oral Daily  . prednisoLONE acetate  1 drop Both Eyes QID  . pregabalin  25 mg Oral BID  . sodium chloride  3 mL Intravenous Q12H  . sodium chloride  3 mL Intravenous Q12H  . spironolactone  25 mg Oral Daily  . valACYclovir  1,000 mg Oral TID    Infusions:     Assessment:   1. Acute on chronic renal failure 2. Chronic systolic HF EF 20% 3. Chronic AF 4. DM2   Plan/Discussion:     Renal function improving. Starting to get some volume on board. Will give one dose of torsemide tonight and see how he does. Check BMET in am.  Continue apixaban for AF. Should be able to go home soon. We will follow with you.  Length of Stay: 2  Arvilla Meres MD 07/03/2015, 7:26 PM  Advanced Heart Failure Team Pager [phone] (M-F; 7a - 4p)  Please contact  Cardiology for night-coverage after hours (4p -7a ) and weekends on [email or web address]

## 2015-07-04 DIAGNOSIS — I509 Heart failure, unspecified: Secondary | ICD-10-CM

## 2015-07-04 LAB — GLUCOSE, CAPILLARY
GLUCOSE-CAPILLARY: 122 mg/dL — AB (ref 65–99)
GLUCOSE-CAPILLARY: 138 mg/dL — AB (ref 65–99)
Glucose-Capillary: 123 mg/dL — ABNORMAL HIGH (ref 65–99)

## 2015-07-04 LAB — BASIC METABOLIC PANEL
ANION GAP: 12 (ref 5–15)
BUN: 71 mg/dL — ABNORMAL HIGH (ref 6–20)
CO2: 30 mmol/L (ref 22–32)
Calcium: 9.4 mg/dL (ref 8.9–10.3)
Chloride: 92 mmol/L — ABNORMAL LOW (ref 101–111)
Creatinine, Ser: 2.36 mg/dL — ABNORMAL HIGH (ref 0.61–1.24)
GFR calc non Af Amer: 27 mL/min — ABNORMAL LOW (ref >60)
GFR, EST AFRICAN AMERICAN: 32 mL/min — AB (ref >60)
Glucose, Bld: 152 mg/dL — ABNORMAL HIGH (ref 65–99)
POTASSIUM: 3.5 mmol/L (ref 3.5–5.1)
Sodium: 134 mmol/L — ABNORMAL LOW (ref 135–145)

## 2015-07-04 MED ORDER — POTASSIUM CHLORIDE CRYS ER 20 MEQ PO TBCR
40.0000 meq | EXTENDED_RELEASE_TABLET | Freq: Once | ORAL | Status: AC
  Administered 2015-07-04: 40 meq via ORAL
  Filled 2015-07-04: qty 2

## 2015-07-04 MED ORDER — SPIRONOLACTONE 25 MG PO TABS: 12.5000 mg | ORAL_TABLET | Freq: Every day | ORAL | Status: DC

## 2015-07-04 NOTE — Progress Notes (Signed)
Family Medicine Teaching Service Daily Progress Note Intern Pager: [phone]  Patient name: Frank Davidson Medical record number: [id number] Date of birth: 01/30/1951 Age: 64 y.o. Gender: male  Primary Care Provider: Abbe Amsterdam, MD Consultants: Cardiology  Code Status: Full   Pt Overview and Major Events to Date:    Assessment and Plan: JADD GASIOR is a 64 y.o. male presenting as direct admit from [facility] for significantly elevated creatinine consistent with Acute on Chronic Renal Failure, also continued dyspnea, wt gain, LE edema, with recent CHF exacerbation (hospitalized from 7/27 to 8/3). PMH is significant for Systolic CHF with NICM (HFrEF with last EF 20%, 11/2014), PAF on chronic anticoagulation, HTN, HLD, DM2 controlled, h/o CVA, COPD, gout, chronic pain syndrome with arthritis / fibromyalgia.  # AoCKD-III, in setting of recent aggressive diuresis for CHF exacerbation. In Acute care clinic SCr  4.6 (baseline 2-2.5, recently 2 on 06/24/15). Was voiding well on aggressive home PO diuresis for CHF. Consider cardiorenal syndrome vs. overdiuresis.  UA with negative protein, hgb or casts.  - HFT consulted likely kidney function due to overdiruesis, appreciate recs - Trend daily BMET, -  SCr 2.36< 2.55 < SCr 3.48 - Will stop spirolactone due CKDIII now and on discharge   # Acute on Chronic Systolic CHF. ECHO 11/2014 with reduced EF 20%, ICD declined in the past. Previous hospitalization 7/27 to 8/3 for acute CHF. Patient diruesised at home metolazone, Torsemide, and spiro. Current symptoms  Dyspnea being fair to improved, 1+ LE edema to mid-calf.  Weight on admissions 234lbs (Dry wt 230-235). BNP 1738.2 (baseline 800-300) - Vitals Stable  - Strict I/O, Current wt 229 lbs 8 oz > 231lbs 14 oz > 230 lbs > 234 lbs  - Received a single dose of torsemide last night  - Continue holding toresmide and metalozone, contact cards to determine dispo  - K+ 3.5, replete (goal K+ 4.0), Mag Loleta Dicker -  2.6/4.2  # DM2, controlled - CBG FS [phone]  - Last A1c 5.6 (12/2014), diet controlled and on glipizide.  - Hold home glipizide - Sensitive SSI and check CBG q AC/HS protocol  # PAF, on chronic anticoagulation - Continue home Eliquis  BID, start dosing tomorrow 8/10 (already received home doses today)  # HTN, controlled - Stable BP, This AM BP 112/77 - Holding Hydralazine due to low BP   # Chronic Pain Syndrome / Arthritis / Fibromyalgia - Involvement of multiple sites / joints, with chronic pain at baseline. No exacerbation. - Continue home Percocet 10/325 q 6 hr PRN - Continue home bowel regimen - Miralax 17g daily, Colace  BID - reduced Lyrica to  BID (from 75, for renal dosing)  # H/o gout - Holding allopurinol given AoCKD  # HZO - Continue Acyclovir  TID and Prednisone eye drops BID as prescribed - Follow-up with Ophtho on discharge  FEN/GI: SLIV / Heart healthy-Carb mod diet  Prophylaxis: Continue chronic anticoagulation - Eliquis (completed home doses 8/9, start 8/10)  Disposition: Telemetry, Dispo today as AKI resolving, contact cards to determine status   Subjective:  Patient doing well this morning. Dyspnea he states is a thing of the past for him. He slept well and feels ready to go this morning.  He denies any dizziness, states that once he stood up and walked around his dizziness stopped. States that n/v stopped was he received phenergan, and this has not returned.     Objective: Temp:  [97.7 F (36.5 C)-98 F (36.7 C)] 97.9 F (36.6 C) (  08/12 0558) Pulse Rate:  [63-82] 63 (08/12 0558) Resp:  [20] 20 (08/12 0558) BP: (125-133)/(73-78) 125/73 mmHg (08/12 0558) SpO2:  [93 %-100 %] 96 % (08/12 0558) Weight:  [229 lb 8 oz (104.101 kg)] 229 lb 8 oz (104.101 kg) (08/12 0558)  Physical Exam: General: sitting up in bed, NAD  Cardiovascular: RRR, no m/r/g, 2+ radial pulses, no JVD  Respiratory: Normal work of  breathing, CTAB Abdomen: BS+,  NTND, Extremities: RLE> LLE. mid-calf 1+ pitting edema  Skin - scar of face from HZO   Laboratory:  Recent Labs Lab 07/02/15 0356 07/03/15 0547  WBC 8.3 7.4  HGB 10.4* 11.0*  HCT 32.0* 33.3*  PLT 257 244    Recent Labs Lab 07/02/15 0356 07/03/15 0547 07/04/15 0303  NA 135 132* 134*  K 3.4* 3.6 3.5  CL 94* 92* 92*  CO2 28 28 30   BUN 92* 82* 71*  CREATININE 3.48* 2.55* 2.36*  CALCIUM 9.7 9.3 9.4  GLUCOSE 113* 116* 152*    Imaging/Diagnostic Tests:  X-ray Chest Pa And Lateral  07/02/2015   CLINICAL DATA:  Increasing SOB last few days, hard time catching breath - ?fluid, acute exacerbation of CHF - hx of CHF, COPD, AFIB, PVD, chronic kidney disease, sleep apnea, stroke  EXAM: CHEST  2 VIEW  COMPARISON:  06/30/2015  FINDINGS: There is moderate enlargement of the cardiopericardial silhouette. No mediastinal or hilar masses or evidence of adenopathy.  Lungs show stable areas of reticular scarring. There is no lung consolidation or evidence of edema. No pleural effusion or pneumothorax.  Bony thorax is intact. There are bridging osteophytes along the mid and lower thoracic spine.  IMPRESSION: 1. No acute cardiopulmonary disease. 2. Moderate cardiomegaly, stable.   Electronically Signed   By: Amie Portland M.D.   On: 07/02/2015 10:10   Dg Chest 2 View  06/30/2015   CLINICAL DATA:  Recent episode of congestive heart failure.  EXAM: CHEST  2 VIEW  COMPARISON:  PA and lateral chest 06/18/2015 and 11/23/2014.  FINDINGS: Again seen is marked cardiomegaly. No consolidative process, pulmonary edema or pneumothorax identified. No pleural effusion.  IMPRESSION: Marked cardiomegaly without acute disease.   Electronically Signed   By: Drusilla Kanner M.D.   On: 06/30/2015 17:43   Dg Chest 2 View  06/18/2015   CLINICAL DATA:  Dyspnea.  EXAM: CHEST  2 VIEW  COMPARISON:  02/19/2015  FINDINGS: There is hyperinflation and marked cardiomegaly. There is mild aortic tortuosity. There is mild linear  scarring in the mid lung are bilaterally. No confluent airspace consolidation. No large effusion. Pulmonary vasculature is normal.  IMPRESSION: Marked cardiomegaly.  Hyperinflation.  Stable linear scarring.   Electronically Signed   By: Ellery Plunk M.D.   On: 06/18/2015 18:09   Dg Chest Port 1 View  07/03/2015   CLINICAL DATA:  64 year old male with history of shortness of breath, syncope and nausea for 1 day.  EXAM: PORTABLE CHEST - 1 VIEW  COMPARISON:  Chest x-ray 07/02/2015.  FINDINGS: Lung volumes are normal. No acute consolidative airspace disease. No pleural effusions. No pneumothorax. No evidence of pulmonary edema. Mild cardiomegaly. Upper mediastinal contours are within normal limits. Atherosclerosis in the thoracic aorta.  IMPRESSION: 1. No radiographic evidence of acute cardiopulmonary disease. 2. Mild cardiomegaly. 3. Atherosclerosis.   Electronically Signed   By: Trudie Reed M.D.   On: 07/03/2015 19:49   Asiyah Mayra Reel, MD 07/04/2015, 7:06 AM PGY-1, [facility] Family Medicine FPTS Intern pager: [phone], text pages  welcome

## 2015-07-04 NOTE — Progress Notes (Signed)
Pt a/o, c/o HA PRN meds given as ordered, pt being dc to home, VSS, pt stable

## 2015-07-04 NOTE — Discharge Summary (Signed)
[facility] Discharge Summary  Patient name: Frank Davidson Medical record number: [id number] Date of birth: September 20, 1951 Age: 64 y.o. Gender: male Date of Admission: 07/01/2015  Date of Discharge: 07/06/2015  Admitting Physician: Dickie La, MD  Primary Care Provider: [name], MD Consultants: Heart Failure Team   Indication for Hospitalization: Acute on Chronic Kidney Injury  Discharge Diagnoses/Problem List:  CHF exacerbation  DM2  PAF  HTN   Disposition: Home  Discharge Condition: Good, Stable   Discharge Exam:  General: sitting up in bed, NAD  Cardiovascular: RRR, no m/r/g, 2+ radial pulses, no JVD  Respiratory: Normal work of breathing, CTAB Abdomen: BS+, NTND, Extremities: RLE> LLE. mid-calf 1+ pitting edema  Skin - scar of face from [place] [facility] Course:  Patient was admitted on 8/9 for acute on chronic renal failure. Patient was receiving diuresis as an outpatient due persistent dyspnea and was found to SCr of 4.6 (baseline 2).  During stay, Heart failure team was consulted and indicated that kidney injury was likely due to over-diruesis.  Diuretics were held from 8/10 - 8/12, with the exception of receiving a single dose of torsemide on 8/11 due the presence of bibasilar crackles per heart failure team. SCr trended down and upon discharge was 2.36. Patient was discharged on spirolactone 12.5 mg and torsemide 40 mg daily.    Issues for Follow Up:  1. F/U on patient's fluid status and kidney function  2. Patient not currently on a statin, consider starting a high dose statins as needed   Significant Procedures: None   Significant Labs and Imaging:   Recent Labs Lab 07/02/15 0356 07/03/15 0547  WBC 8.3 7.4  HGB 10.4* 11.0*  HCT 32.0* 33.3*  PLT 257 244    Recent Labs Lab 06/30/15 1618 07/02/15 0015 07/02/15 0356 07/03/15 0547 07/04/15 0303  NA 133* 136 135 132* 134*  K 4.1 3.5 3.4* 3.6 3.5  CL 95* 95*  94* 92* 92*  CO2 _0 GLUCOSE 88 111* 113* 116* 152*  BUN 82* 96* 92* 82* 71*  CREATININE 4.61* 3.77* 3.48* 2.55* 2.36*  CALCIUM 9.5 9.9 9.7 9.3 9.4  MG  --   --  2.6*  --   --   PHOS  --   --  4.2  --   --     Results/Tests Pending at Time of Discharge:   Discharge Medications:    Medication List    STOP taking these medications        allopurinol 100 MG tablet  Commonly known as:  ZYLOPRIM     hydrALAZINE 50 MG tablet  Commonly known as:  APRESOLINE      TAKE these medications        ACCU-CHEK FASTCLIX LANCETS Misc  Test blood sugar once daily. Dx code: E11.8     ACCU-CHEK NANO SMARTVIEW W/DEVICE Kit  Test blood sugar daily. Dx code: E11.8     ACCU-CHEK SMARTVIEW CONTROL Liqd  Test blood sugar once daily. Dx code: E11.8.     acetaminophen 500 MG tablet  Commonly known as:  TYLENOL  Take 500 mg by mouth every 6 (six) hours as needed for moderate pain or headache.     albuterol 108 (90 BASE) MCG/ACT inhaler  Commonly known as:  PROVENTIL HFA;VENTOLIN HFA  Inhale 2 puffs into the lungs every 6 (six) hours as needed for wheezing or shortness of breath.     apixaban 5 MG Tabs  tablet  Commonly known as:  ELIQUIS  Take 1 tablet (5 mg total) by mouth 2 (two) times daily.     B-D SINGLE USE SWABS REGULAR Pads  Test blood sugar daily. Dx code: E11.8     baclofen 10 MG tablet  Commonly known as:  LIORESAL  TAKE 1 TABLET EVERY 8 HOURS AS NEEDED FOR MUSCLE SPASM(S)     carvedilol 25 MG tablet  Commonly known as:  COREG  TAKE 1/2 TABLET TWICE DAILY  WITH  A  MEAL     colchicine 0.6 MG tablet  Take 1.2 mg by mouth daily as needed (Gout). Colcrys     diclofenac 1.3 % Ptch  Commonly known as:  FLECTOR  Place 1 patch onto the skin 2 (two) times daily as needed (pain).     DSS 100 MG Caps  Take 100 mg by mouth 2 (two) times daily.     glipiZIDE 5 MG tablet  Commonly known as:  GLUCOTROL  Take 1 tablet (5 mg total) by mouth 2 (two) times daily.      glucose blood test strip  Commonly known as:  ACCU-CHEK SMARTVIEW  Test blood sugar once daily. Dx code: E11.8     isosorbide mononitrate 60 MG 24 hr tablet  Commonly known as:  IMDUR  TAKE 1 TABLET (60 MG TOTAL) BY MOUTH DAILY.     oxyCODONE-acetaminophen 10-325 MG per tablet  Commonly known as:  PERCOCET  Take 1 tablet by mouth 4 (four) times daily. scheduled     polyethylene glycol powder powder  Commonly known as:  GLYCOLAX/MIRALAX  MIX 1 CAPFUL (17 GM) IN LIQUID AND DRINK EVERY DAY AS DIRECTED     potassium chloride SA 20 MEQ tablet  Commonly known as:  K-DUR,KLOR-CON  Take 1 tablet (20 mEq total) by mouth daily.     prednisoLONE acetate 1 % ophthalmic suspension  Commonly known as:  PRED FORTE  Place 1 drop into the right eye 4 (four) times daily.     spironolactone 25 MG tablet  Commonly known as:  ALDACTONE  Take 0.5 tablets (12.5 mg total) by mouth daily.     torsemide 20 MG tablet  Commonly known as:  DEMADEX  Take 2 tablets (40 mg total) by mouth 2 (two) times daily.     valACYclovir 1000 MG tablet  Commonly known as:  VALTREX  Take 1 tablet (1,000 mg total) by mouth 3 (three) times daily.        Discharge Instructions: Please refer to Patient Instructions section of EMR for full details.  Patient was counseled important signs and symptoms that should prompt return to medical care, changes in medications, dietary instructions, activity restrictions, and follow up appointments.   Follow-Up Appointments: Follow-up Information    Follow up On 07/11/2015.   Why:  1200      Schedule an appointment as soon as possible for a visit with COPLAND,JESSICA, MD.   Specialty:  Family Medicine   Why:  [facility] follow-up   Contact information:   [address] [place] [address] [phone]       Follow up with [facility]. Go on 07/11/2015.   Specialty:  Cardiology   Why:   @ 12 PM   Contact information:   [address] [id number] [place] [address] [phone]      Asiyah Zahra Mikell, MD 07/06/2015, 9:47 PM PGY-1, [facility] 

## 2015-07-04 NOTE — Progress Notes (Signed)
Heart Failure Navigator Consult Note  Presentation: Frank Davidson is a 64 y.o. male presenting as direct admit from [facility] for significantly elevated creatinine consistent with Acute on Chronic Renal Failure, also continued dyspnea, wt gain, LE edema, with recent CHF exacerbation (hospitalized from 7/27 to 8/3). PMH is significant for Systolic CHF with NICM (HFrEF with last EF 20%, 11/2014), PAF on chronic anticoagulation, HTN, HLD, DM2 controlled, h/o CVA, COPD, gout, chronic pain syndrome with arthritis / fibromyalgia  Past Medical History  Diagnosis Date  . Atrial fibrillation     PAF  . Hypertension   . COPD (chronic obstructive pulmonary disease)   . Ocular herpes   . Gout   . CHF (congestive heart failure)   . Chronic pain   . Hyperlipidemia   . Nonischemic cardiomyopathy Aug 2015    EF 15%  . Ocular herpes   . Neuropathy   . Left-sided weakness     "because of the arthritis and edema"  . PVD (peripheral vascular disease) May 2015    abnormal ABIs  . Memory impairment     "short term"  . CKD (chronic kidney disease), stage III     Dr. Briant Cedar follows-holding   . Fibromyalgia     neuropathy- hands, more than feet.  . Sleep apnea with use of continuous positive airway pressure (CPAP)     does not use cpap  . GERD (gastroesophageal reflux disease)     tums as needed  . Asthma   . Type II diabetes mellitus   . Stroke X 5    last stroke aug 2014"; denies residual on 09/12/2014  . Osteoarthritis   . Arthritis     "knees, right elbow, shoulder" (09/12/2014)  . Shortness of breath dyspnea     Social History   Social History  . Marital Status: Divorced    Spouse Name: N/A  . Number of Children: N/A  . Years of Education: N/A   Social History Main Topics  . Smoking status: Former Smoker -- 0.00 packs/day for 0 years    Types: Cigarettes    Quit date: 12/18/2006  . Smokeless tobacco: Never Used  . Alcohol Use: No     Comment: Quit 2008  . Drug Use: Yes   Special: Cocaine, Marijuana     Comment: last used marijuana and cocaine 2008  . Sexual Activity: Not Asked   Other Topics Concern  . None   Social History Narrative    ECHO:Study Conclusions--11/26/14  - Left ventricle: The cavity size was normal. Wall thickness was normal. Systolic function was severely reduced. The estimated ejection fraction was 20%. LV apical false tendon. Severe global hypokinesis. The study is not technically sufficient to allow evaluation of LV diastolic function. - Left atrium: Severely dilated at 50 ml/m2. - Right atrium: Moderately dilated at 24 cm2.  Impressions:  - Compared to the prior echo in 2014, there has been little if any improvement in EF to around 20%, severe global hypokinesis, biatrial enlargement.  Transthoracic echocardiography. M-mode, complete 2D, spectral Doppler, and color Doppler. Birthdate: Patient birthdate: 04-04-1951. Age: Patient is 64 yr old. Sex: Gender: male. BMI: 33.3 kg/m^2. Blood pressure:   136/81 Patient status: Inpatient. Study date: Study date: 11/26/2014. Study time: 10:29 AM. Location: Bedside.  BNP    Component Value Date/Time   BNP 1738.2* 07/02/2015 0015    ProBNP    Component Value Date/Time   PROBNP 3555.0* 10/25/2014 1048     Education Assessment and Provision:  Detailed  education and instructions provided on heart failure disease management including the following:  Signs and symptoms of Heart Failure When to call the physician Importance of daily weights Low sodium diet Fluid restriction Medication management Anticipated future follow-up appointments  Patient education given on each of the above topics.  Patient acknowledges understanding and acceptance of all instructions.  I spoke with Mr. Niklas regarding his HF and to review home recommendations for HF.  He says that he has been coming to the [facility] Clinic for a while and as been educated before regarding his  HF.  He weighs daily at home and is very "judicious" with salt and sodium.  I reviewed high sodium foods to avoid and a low sodium diet.  He tells me that acquiring his medications have been "touch and go" at times--yet he has been communicating this and the clinic has assisted when necessary.  I encouraged him to call me with any questions or concerns related to his HF after discharge if needed.  Education Materials:  "Living Better With Heart Failure" Booklet, Daily Weight Tracker Tool   High Risk Criteria for Readmission and/or Poor Patient Outcomes:   EF <30%- Yes 20%  2 or more admissions in 6 months- yes 2   Difficult social situation- No  Demonstrates medication noncompliance- denies    Barriers of Care:  Knowledge and compliance  Discharge Planning:   Plans to return home with family

## 2015-07-04 NOTE — Consult Note (Signed)
Advanced Heart Failure Team Progress Note   Subjective    Renal function continues to improve. Weight down two pounds with torsemide last night.  Breathing ok.  Wants to go home.  Objective:    Vital Signs:   Temp:  [97.7 F (36.5 C)-98 F (36.7 C)] 97.9 F (36.6 C) (08/12 0558) Pulse Rate:  [63-82] 63 (08/12 0558) Resp:  [20] 20 (08/12 0558) BP: (125-133)/(73-78) 125/73 mmHg (08/12 0558) SpO2:  [93 %-100 %] 96 % (08/12 0558) Weight:  [229 lb 8 oz (104.101 kg)] 229 lb 8 oz (104.101 kg) (08/12 0558) Last BM Date: 07/03/15  Weight change: Filed Weights   07/02/15 0532 07/03/15 0619 07/04/15 0558  Weight: 230 lb (104.327 kg) 231 lb 14.4 oz (105.189 kg) 229 lb 8 oz (104.101 kg)    Intake/Output:   Intake/Output Summary (Last 24 hours) at 07/04/15 1037 Last data filed at 07/04/15 0601  Gross per 24 hour  Intake    840 ml  Output   1150 ml  Net   -310 ml     Physical Exam: General: Sitting up on side of bed. NAD HEENT: normal Neck: supple, thick, JVP 8, but difficult to assess.. Carotids 2+ bilat; no bruits. No lymphadenopathy or thryomegaly appreciated. Cor: PMI laterally displaced. Irregular rate & rhythm. 2/6 MR Lungs: CTA Abdomen: obese, soft, nontender, nondistended. No hepatosplenomegaly. No bruits or masses. Good bowel sounds. Extremities: no cyanosis, clubbing, rash, trace edema Neuro: alert & orientedx3, cranial nerves grossly intact. moves all 4 extremities w/o difficulty. Affect pleasant  Telemetry: A fib 60-70s   Labs: Basic Metabolic Panel:  Recent Labs Lab 06/30/15 1618 07/02/15 0015 07/02/15 0356 07/03/15 0547 07/04/15 0303  NA 133* 136 135 132* 134*  K 4.1 3.5 3.4* 3.6 3.5  CL 95* 95* 94* 92* 92*  CO2 GLUCOSE 88 111* 113* 116* 152*  BUN 82* 96* 92* 82* 71*  CREATININE 4.61* 3.77* 3.48* 2.55* 2.36*  CALCIUM 9.5 9.9 9.7 9.3 9.4  MG  --   --  2.6*  --   --   PHOS  --   --  4.2  --   --     Liver Function Tests: No  results for input(s): AST, ALT, ALKPHOS, BILITOT, PROT, ALBUMIN in the last 168 hours. No results for input(s): LIPASE, AMYLASE in the last 168 hours. No results for input(s): AMMONIA in the last 168 hours.  CBC:  Recent Labs Lab 07/02/15 0356 07/03/15 0547  WBC 8.3 7.4  HGB 10.4* 11.0*  HCT 32.0* 33.3*  MCV 85.3 87.4  PLT 257 244    Cardiac Enzymes: No results for input(s): CKTOTAL, CKMB, CKMBINDEX, TROPONINI in the last 168 hours.  BNP: BNP (last 3 results)  Recent Labs  05/19/15 1229 06/18/15 1833 07/02/15 0015  BNP 343.6* 823.0* 1738.2*    ProBNP (last 3 results)  Recent Labs  08/08/14 1116 10/19/14 2158 10/25/14 1048  PROBNP 984.5* 1637.0* 3555.0*     CBG:  Recent Labs Lab 07/03/15 1154 07/03/15 1705 07/03/15 2058 07/04/15 0627 07/04/15 0748  GLUCAP 154* 146* 124* 123* 122*    Coagulation Studies: No results for input(s): LABPROT, INR in the last 72 hours.  Other results:  Imaging: Dg Chest Port 1 View  07/03/2015   CLINICAL DATA:  64 year old male with history of shortness of breath, syncope and nausea for 1 day.  EXAM: PORTABLE CHEST - 1 VIEW  COMPARISON:  Chest x-ray 07/02/2015.  FINDINGS: Lung volumes  are normal. No acute consolidative airspace disease. No pleural effusions. No pneumothorax. No evidence of pulmonary edema. Mild cardiomegaly. Upper mediastinal contours are within normal limits. Atherosclerosis in the thoracic aorta.  IMPRESSION: 1. No radiographic evidence of acute cardiopulmonary disease. 2. Mild cardiomegaly. 3. Atherosclerosis.   Electronically Signed   By: Trudie Reed M.D.   On: 07/03/2015 19:49     Medications:     Current Medications: . apixaban  5 mg Oral BID  . aspirin EC  81 mg Oral Daily  . carvedilol  12.5 mg Oral BID WC  . insulin aspart  0-5 Units Subcutaneous QHS  . insulin aspart  0-9 Units Subcutaneous TID WC  . isosorbide mononitrate  60 mg Oral Daily  . polyethylene glycol  17 g Oral Daily  .  prednisoLONE acetate  1 drop Both Eyes QID  . pregabalin  25 mg Oral BID  . sodium chloride  3 mL Intravenous Q12H  . sodium chloride  3 mL Intravenous Q12H  . valACYclovir  1,000 mg Oral TID    Infusions:     Assessment:   1. Acute on chronic renal failure 2. Chronic systolic HF EF 20% 3. Chronic AF 4. DM2   Plan/Discussion:    Renal function continues to improve. Down 2 lbs.  Continue apixaban for AF.  Likely OK for home.   Will need outpatient chronic diuresis, but possibly lower dose from previous with concerns over renal function.  Will discuss with MD. Will set up for HF follow up in our clinic.  Length of Stay: 3  Graciella Freer PA-C 07/04/2015, 10:37 AM  Advanced Heart Failure Team Pager [phone] (M-F; 7a - 4p)  Please contact [facility] for night-coverage after hours (4p -7a ) and weekends on [email or web address]   Patient seen and examined with Otilio Saber, PA-C. We discussed all aspects of the encounter. I agree with the assessment and plan as stated above.   Agree. Ready for d/c with close outpatient f/u.  Bensimhon, Daniel,MD 11:02 PM

## 2015-07-04 NOTE — Care Management Important Message (Signed)
Important Message  Patient Details  Name: Frank Davidson MRN: [id number] Date of Birth: 09/14/1951   Medicare Important Message Given:  Yes-second notification given    Yvonna Alanis 07/04/2015, 12:05 PM

## 2015-07-07 ENCOUNTER — Other Ambulatory Visit: Payer: Self-pay | Admitting: *Deleted

## 2015-07-07 NOTE — Patient Outreach (Signed)
[facility]) Care Management  07/04/2015  Frank Davidson Mar 03, 1951 [id number]  Initial transition of care call.  RN attempted to reach pt today however unsuccessful. Will continue outreach attempted for [facility] services.   [name], RN Care Management Coordinator [facility] Main Office [phone]

## 2015-07-07 NOTE — Patient Outreach (Signed)
[facility]) Care Management  07/07/2015  KESHUN WASZAK [birth year] [id number]   Second attempt to reach pt for transition of care however remains unsuccessful. Will outreach to family members as requested for connection with this pt for [facility] services.  [name], RN Care Management Coordinator [facility] Main Office [phone]

## 2015-07-08 ENCOUNTER — Other Ambulatory Visit: Payer: Self-pay | Admitting: *Deleted

## 2015-07-08 ENCOUNTER — Telehealth (HOSPITAL_COMMUNITY): Payer: Self-pay | Admitting: Surgery

## 2015-07-08 NOTE — Telephone Encounter (Signed)
Heart Failure Nurse Navigator Post Discharge Telephone Call  I called to check on Frank Davidson after his recent hospitalization.  He tells me that he feels "like things are moving along slowly".  He says that his weight is "steady" --yet todays weight was 237 lbs versus 229.5 lbs day of discharge.  I inquired as to how he has been eating and he tells me that he has had Chinese rice and vegetables today with "no MSG" and yesterday had canned soup.  I strongly encouraged him to not eat either of those choices and reinforced that they were high sodium food choices.  I reviewed a low sodium diet and high sodium foods to avoid.  He says that he has all medications is taking them as prescribed.  I asked specifically about his Demadex and confirmed the dose. He has a hosp follow-up appt on Friday in the [facility] at 1200.  I encouraged him to call me back with questions or concerns about his HF.

## 2015-07-08 NOTE — Patient Outreach (Signed)
[facility]) Care Management  07/08/2015  KANTON BETTIN 1951-10-03 [id number]  RN spoke with pt's niece today Therapist, occupational) concerning pt's recent discharge. RN has attempted to contact pt twice via his cell however unsuccessful reaching this pt. Ms. [name] will relay the message to the pt ad request pt to contact RN directly due to she does not have the discharge to complete a transition of care template concerning pt's limitations, appointments or restrictions. Tometta Camera is on pt's HIPAA however again does not have all the information needed for today. RN will await a call back later today or tomorrow and will inquire further on [facility] involvement.   [name], RN Care Management Coordinator [facility] Main Office [phone]

## 2015-07-09 ENCOUNTER — Other Ambulatory Visit: Payer: Self-pay | Admitting: *Deleted

## 2015-07-09 NOTE — Patient Outreach (Signed)
[facility]) Care Management  07/09/2015  Frank Davidson 1951/08/25 [id number]  Initial Transition of Care  RN spoke with pt today concerning his recent discharge. Pt verbalized an understanding of his recent hospitalization and states he was discharge on 8/12. Pt reports he was also diagnosed Shingles (much improved). Pt reports he is aware of the HF zones and confirms he is in the GREEN zone with a weight yesterday of 230 lbs and today 231 lbs. Denies any swelling to hands, ankle and trunk areas. RN reiterated on the HF signs and symptoms and what to do if acute symptoms are encountered.  Strongly encourage continue daily monitoring and to document all readings for his providers to view concerning her weights.  RN offered [facility] involvement with community home visit or telephonic services with a health coach.  RN also offered to follow up over the next few weeks. Pt currently decline home visits for HF education and teachings but was receptive to ongoing telephonic follow ups. RN will schedule for a telephonic call in one week (pt agreed).   [name], RN Care Management Coordinator [facility] Main Office [phone]

## 2015-07-09 NOTE — Patient Outreach (Signed)
[facility]) Care Management  07/09/2015  ISRAEL WOODRIDGE 1950-11-24 [id number]  RN received a voice message from pt this morning indicating he received a message from his niece to return a call to [facility] for pending services. RN will follow up accordingly with pt's request.   [name], RN Care Management Coordinator [facility] Main Office [phone]

## 2015-07-10 ENCOUNTER — Other Ambulatory Visit: Payer: Self-pay | Admitting: Family Medicine

## 2015-07-11 ENCOUNTER — Ambulatory Visit (HOSPITAL_COMMUNITY)
Admission: RE | Admit: 2015-07-11 | Discharge: 2015-07-11 | Disposition: A | Discharge status: Home / Self Care | Payer: Commercial Managed Care - HMO | Source: Ambulatory Visit | Attending: Cardiology | Admitting: Cardiology

## 2015-07-11 ENCOUNTER — Telehealth: Payer: Self-pay

## 2015-07-11 VITALS — BP 120/76 | HR 87 | Wt 235.5 lb

## 2015-07-11 DIAGNOSIS — R413 Other amnesia: Secondary | ICD-10-CM | POA: Insufficient documentation

## 2015-07-11 DIAGNOSIS — M109 Gout, unspecified: Secondary | ICD-10-CM | POA: Insufficient documentation

## 2015-07-11 DIAGNOSIS — I129 Hypertensive chronic kidney disease with stage 1 through stage 4 chronic kidney disease, or unspecified chronic kidney disease: Secondary | ICD-10-CM | POA: Diagnosis not present

## 2015-07-11 DIAGNOSIS — E1122 Type 2 diabetes mellitus with diabetic chronic kidney disease: Secondary | ICD-10-CM | POA: Insufficient documentation

## 2015-07-11 DIAGNOSIS — E1140 Type 2 diabetes mellitus with diabetic neuropathy, unspecified: Secondary | ICD-10-CM | POA: Diagnosis not present

## 2015-07-11 DIAGNOSIS — N183 Chronic kidney disease, stage 3 unspecified: Secondary | ICD-10-CM

## 2015-07-11 DIAGNOSIS — K219 Gastro-esophageal reflux disease without esophagitis: Secondary | ICD-10-CM | POA: Insufficient documentation

## 2015-07-11 DIAGNOSIS — M797 Fibromyalgia: Secondary | ICD-10-CM | POA: Insufficient documentation

## 2015-07-11 DIAGNOSIS — J45909 Unspecified asthma, uncomplicated: Secondary | ICD-10-CM | POA: Insufficient documentation

## 2015-07-11 DIAGNOSIS — Z833 Family history of diabetes mellitus: Secondary | ICD-10-CM | POA: Diagnosis not present

## 2015-07-11 DIAGNOSIS — I480 Paroxysmal atrial fibrillation: Secondary | ICD-10-CM | POA: Insufficient documentation

## 2015-07-11 DIAGNOSIS — G8929 Other chronic pain: Secondary | ICD-10-CM | POA: Diagnosis not present

## 2015-07-11 DIAGNOSIS — Z8673 Personal history of transient ischemic attack (TIA), and cerebral infarction without residual deficits: Secondary | ICD-10-CM | POA: Diagnosis not present

## 2015-07-11 DIAGNOSIS — M25559 Pain in unspecified hip: Secondary | ICD-10-CM

## 2015-07-11 DIAGNOSIS — F1190 Opioid use, unspecified, uncomplicated: Secondary | ICD-10-CM

## 2015-07-11 DIAGNOSIS — E785 Hyperlipidemia, unspecified: Secondary | ICD-10-CM | POA: Insufficient documentation

## 2015-07-11 DIAGNOSIS — I429 Cardiomyopathy, unspecified: Secondary | ICD-10-CM | POA: Diagnosis not present

## 2015-07-11 DIAGNOSIS — I739 Peripheral vascular disease, unspecified: Secondary | ICD-10-CM | POA: Diagnosis not present

## 2015-07-11 DIAGNOSIS — Z7902 Long term (current) use of antithrombotics/antiplatelets: Secondary | ICD-10-CM | POA: Diagnosis not present

## 2015-07-11 DIAGNOSIS — Z79899 Other long term (current) drug therapy: Secondary | ICD-10-CM | POA: Diagnosis not present

## 2015-07-11 DIAGNOSIS — I5022 Chronic systolic (congestive) heart failure: Secondary | ICD-10-CM | POA: Diagnosis not present

## 2015-07-11 DIAGNOSIS — J449 Chronic obstructive pulmonary disease, unspecified: Secondary | ICD-10-CM | POA: Insufficient documentation

## 2015-07-11 MED ORDER — HYDRALAZINE HCL 50 MG PO TABS: 50.0000 mg | ORAL_TABLET | Freq: Three times a day (TID) | ORAL | Status: DC

## 2015-07-11 NOTE — Progress Notes (Signed)
Patient ID: Frank Davidson, male   DOB: [birth year], 64 y.o.   MRN: [id number]  PCP: Frank Cedar FNP GI: Dr Frank Davidson Nephrologist: Dr. Mercy Davidson  HPI: Frank Davidson is a 64 y.o. male with a hx of obesity, PAF, HTN, COPD, HTN, gout, DM 2, CVA and CHF EF 25% due to NICM. Intolerant Entresto at the lowest dose due to syncope.   Has previously followed in the [facility] in [place]. Previous cath in 2007 showed NICM with normal cors. Was living alone in [place] and apparently had issues managing medications by himself and was getting confused with PCP and cardiologists medications and was not taking his meds correctly. He moved to [place] to be near his nieces. Issues in the past with ACE-I due to renal failure. Highest creatinine in our system is ~1.5 but was apparently higher previously - thought to be related to taking his medications incorrectly.   Was admitted in 06/2013 from [facility] with left sided weakness and was found to have a moderate to large right hemispheric infarct. Went back to [facility].   Admitted to [facility] 10/14 through 09/09/13 with ADHF. Diuresed 30 pounds with IV lasix and transitioned to lasix 80 mg po bid. Continued on 25 mg carvedilol bid, isordil 20 mg daily.  Started on lisinopril 2.5 mg daily. Discharge weight 229 pounds. 09/29/13 CT abdomen and pelvis - no obstruction . Diverticulosis. No inflammatory changes.   Admitted 11/23/13 through 11/26/13 with volume overload and dyspnea. Had worsening cardiorenal syndrome. Nephrology consulted and managed with IV lasix and transitioned to torsemide 40 mg twice a day.  Diuresed 25 pounds. Discharge weight 232 pounds. Hydralazine, Spiro, and Lisinopril stopped at discharge. He is also off digoxin.    Admitted 07/01/15 for AKI after presumed over diuresis. His Cr on admit was 4.6., though his baseline is 2.  We followed him in the hospital and held diuresis for several days.  Cr on discharge was 2.36. We decreased his spiro to  12.5 mg, and his diuretic regimen was continued at 40 mg torsemide BID.  Discharge weight was 229.  He returns for post hospital follow up. He had an admission recently as above.  Weights at home 228-230.  This morning was 234, but states he has been constipated for several days. He says his breathing has been ok overall, but feels a little labored today. He denies DOE/orthopnea/pnd.  No CP or lightheadedness. No bleeding problems. He says he can walk about a mile with only minimal-moderate SOB, as long as he holds onto something such as a cane or a walker.  Tries to limit fluid intake to < 2 liters, has cut back on the ice and ice pops. Still occasional eats canned ravioli. Taking all medications except for hydralazine, which he has been out of for 2 days. Renal function back to baseline.    ECHO 11/08/13 EF 15%  ECHO 07/05/14: EF 15%  ECHO 11/26/2014: EF 20%   08/13/14 RA = 7  RV = 50/3/10  PA = 54/22 (34)  PCW = 16  Fick cardiac output/index = 6.9/3.0  Thermo CO/CI = 4.7/2.1  PVR = 4.1 WU  Ao sat = 94%  PA sat = 68%,68%  SVC sat (sheath) = 62%   Labs 08/22/14 K 4.4 creatinine 1.6  09/02/14 Creatinine 1.6 10/20/14: creatinine 2.58, K 5.1 11/23/2014: Creatinine 4.12 --Ace and Spiro stopped 11/24/2014: Creatinine 3.55 1/4/12016: 2.49  11/26/2014: Hgb 10.4 Hct 31.7 Creatinine 1.94  12/03/14: K 4, creatinine 2.25 02/19/2015: K  3.8 Creatinine 2.66  05/05/2015: K 3.4 Creatinine 2.68   8/12/206: K 3.5 Creatinine 2.36, hgb 10.4  ECG: NSR, PACs, LAFB  SH: Retired Education officer, [employer] 2011 Lives with his niece  FH: Father DM  Mom had lung cancer  ROS: All systems negative except as listed in HPI, PMH and Problem List.  Past Medical History  Diagnosis Date  . Atrial fibrillation     PAF  . Hypertension   . COPD (chronic obstructive pulmonary disease)   . Ocular herpes   . Gout   . CHF (congestive heart failure)   . Chronic pain   . Hyperlipidemia   . Nonischemic cardiomyopathy Aug 2015    EF  15%  . Ocular herpes   . Neuropathy   . Left-sided weakness     "because of the arthritis and edema"  . PVD (peripheral vascular disease) May 2015    abnormal ABIs  . Memory impairment     "short term"  . CKD (chronic kidney disease), stage III     Dr. Mercy Davidson follows-holding   . Fibromyalgia     neuropathy- hands, more than feet.  . Sleep apnea with use of continuous positive airway pressure (CPAP)     does not use cpap  . GERD (gastroesophageal reflux disease)     tums as needed  . Asthma   . Type II diabetes mellitus   . Stroke X 5    last stroke aug 2014"; denies residual on 09/12/2014  . Osteoarthritis   . Arthritis     "knees, right elbow, shoulder" (09/12/2014)  . Shortness of breath dyspnea     Current Outpatient Prescriptions  Medication Sig Dispense Refill  . ACCU-CHEK FASTCLIX LANCETS MISC Test blood sugar once daily. Dx code: E11.8 100 each 3  . acetaminophen (TYLENOL) 500 MG tablet Take 500 mg by mouth every 6 (six) hours as needed for moderate pain or headache.    . albuterol (PROVENTIL HFA;VENTOLIN HFA) 108 (90 BASE) MCG/ACT inhaler Inhale 2 puffs into the lungs every 6 (six) hours as needed for wheezing or shortness of breath. 1 Inhaler 1  . Alcohol Swabs (B-D SINGLE USE SWABS REGULAR) PADS Test blood sugar daily. Dx code: E11.8 100 each 3  . allopurinol (ZYLOPRIM) 100 MG tablet Take 100 mg by mouth daily.    Marland Kitchen apixaban (ELIQUIS) 5 MG TABS tablet Take 1 tablet (5 mg total) by mouth 2 (two) times daily. 180 tablet 3  . baclofen (LIORESAL) 10 MG tablet TAKE 1 TABLET EVERY 8 HOURS AS NEEDED FOR MUSCLE SPASM(S) 90 tablet 1  . Blood Glucose Calibration (ACCU-CHEK SMARTVIEW CONTROL) LIQD Test blood sugar once daily. Dx code: E11.8. 1 each 3  . Blood Glucose Monitoring Suppl (ACCU-CHEK NANO SMARTVIEW) W/DEVICE KIT Test blood sugar daily. Dx code: E11.8 1 kit 0  . carvedilol (COREG) 25 MG tablet TAKE 1/2 TABLET TWICE DAILY  WITH  A  MEAL 90 tablet 2  . colchicine 0.6  MG tablet Take 1.2 mg by mouth daily as needed (Gout). Colcrys    . diclofenac (FLECTOR) 1.3 % PTCH Place 1 patch onto the skin 2 (two) times daily as needed (pain).    Marland Kitchen glipiZIDE (GLUCOTROL) 5 MG tablet Take 1 tablet (5 mg total) by mouth 2 (two) times daily. 180 tablet 3  . glucose blood (ACCU-CHEK SMARTVIEW) test strip Test blood sugar once daily. Dx code: E11.8 100 each 3  . isosorbide mononitrate (IMDUR) 60 MG 24 hr tablet TAKE 1 TABLET (60  MG TOTAL) BY MOUTH DAILY. 90 tablet 2  . oxyCODONE-acetaminophen (PERCOCET) 10-325 MG per tablet Take 1 tablet by mouth 4 (four) times daily. scheduled    . polyethylene glycol powder (GLYCOLAX/MIRALAX) powder MIX 1 CAPFUL (17 GM) IN LIQUID AND DRINK EVERY DAY AS DIRECTED 1700 g 2  . potassium chloride SA (K-DUR,KLOR-CON) 20 MEQ tablet Take 1 tablet (20 mEq total) by mouth daily. 30 tablet 0  . prednisoLONE acetate (PRED FORTE) 1 % ophthalmic suspension Place 1 drop into the right eye 4 (four) times daily. (Patient taking differently: Place 1 drop into both eyes 4 (four) times daily. ) 5 mL 0  . spironolactone (ALDACTONE) 25 MG tablet Take 0.5 tablets (12.5 mg total) by mouth daily. 90 tablet 3  . torsemide (DEMADEX) 20 MG tablet Take 2 tablets (40 mg total) by mouth 2 (two) times daily. 60 tablet 0  . valACYclovir (VALTREX) 1000 MG tablet Take 1 tablet (1,000 mg total) by mouth 3 (three) times daily. 42 tablet 0  . docusate sodium 100 MG CAPS Take 100 mg by mouth 2 (two) times daily. (Patient not taking: Reported on 07/11/2015) 10 capsule 0   No current facility-administered medications for this encounter.    Filed Vitals:   07/11/15 0925  BP: 120/76  Pulse: 87  Weight: 235 lb 8 oz (106.822 kg)  SpO2: 94%   PHYSICAL EXAM: General:  Obese male; Ambulated into clinic without difficulty.  HEENT: normal Neck: supple. JVP difficult to assess d/t body habitus - does not appear elevated.  Carotids 2+ bilaterally; no bruits. No lymphadenopathy or  thryomegaly noted. Cor: PMI nonpalpable. Regular rate & rhythm. No rubs, gallops or murmurs appreciated Lungs: CTA bilaterally Abdomen: obese. soft, nontender, + distended. No HSM. No bruits or masses. Good bowel sounds. Extremities: no cyanosis, clubbing, rash, trace-1+bilateral ankle edema. Compression stockings in place.   Neuro: alert & orientedx3, cranial nerves grossly intact. Affect pleasant.  ASSESSMENT & PLAN:  1) Chronic Systolic Heart Failure: Nonischemic cardiomyopathy, EF 20% 1/16. NYHA II symptoms and volume status stable after recent admission. .  - Continue torsemide 40 mg BID.  - Continue 12.5 mg spiro.  - Continue coreg 12.5 mg BID. Has not tolerated up titration in the past due to fatigue. - Continue Imdur + hydralazine 50 mg tid.    - Off digoxin due to CKD.  - Check BMET and BNP now.  - Syncope in past with Entresto.  Corlanor also stopped during recent admit.  - He remains firm in his decision to not pursue ICD.  - Reinforced the need and importance of daily weights, a low sodium diet and to avoid soups, and fluid restriction (less than 2 L a day). Instructed to call the HF clinic if weight increases more than 3 lbs overnight or 5 lbs in a week.  2) Gout: Continue allopurinol. Continue to follow with rheumatology.  3) Atrial fibrillation: Paroxysmal.  EKG today shows NSR. Continue Eliquis. CBCs have been stable.  4) CKD stage III: Baseline 1.9-2.4. Follows with nephrology.   BMET today over at research. Follow up 3-4 weeks.  Shirley Friar PA-C 07/11/2015   Patient seen with PA, agree with the above note.  He was recently admitted with AKI and CHF.  Creatinine is now back to baseline and volume status looks ok on torsemide 40 mg bid.  He will need close followup going forwards.   Loralie Champagne 07/12/2015

## 2015-07-11 NOTE — Patient Instructions (Signed)
Your physician recommends that you schedule a follow-up appointment in: 3-4 weeks  

## 2015-07-11 NOTE — Telephone Encounter (Signed)
Pt states that at his last visit a referral to pain management was discussed. However, he has not heard anything further about this, nor do we see a referral in the system. Pt is still interested in being referred to pain management.

## 2015-07-14 ENCOUNTER — Other Ambulatory Visit: Payer: Self-pay | Admitting: *Deleted

## 2015-07-14 NOTE — Telephone Encounter (Signed)
Pt may need to be seen?  Do you agree?

## 2015-07-14 NOTE — Telephone Encounter (Signed)
Will need to be ordered by provider.

## 2015-07-14 NOTE — Patient Outreach (Signed)
[facility]) Care Management  07/14/2015  OLGA NORSWORTHY 06-Feb-1951 [id number]  Transition of care contact   RN attempted to reach pt as requested to contact him on his personal line however RN only able to left a HIPAA approved voice message requesting a call back. Will intervene further on the return call.  [name], RN Care Management Coordinator [facility] Main Office [phone]

## 2015-07-15 NOTE — Telephone Encounter (Signed)
Left VM to call back to get more information.  

## 2015-07-15 NOTE — Telephone Encounter (Signed)
Patient takes 4 percocet a day for bilateral hip pain. He had surgery on his left hip last October and is to have surgery on the right one in the future. I asked him who did his hip surgery and he stated he can't remember but we should have it in his file. Per patient he discuss the pain referral with Dr Patsy Lager the last time he was here. Patients call back number is [phone]

## 2015-07-15 NOTE — Telephone Encounter (Signed)
Referral placed.

## 2015-07-15 NOTE — Telephone Encounter (Signed)
Called pt and left message. Need more information. What does he want to see a pain management clinic for? What meds does he currently take for pain? Which provider told him they would refer him to pain management?

## 2015-07-16 ENCOUNTER — Emergency Department (HOSPITAL_COMMUNITY)
Admission: EM | Admit: 2015-07-16 | Discharge: 2015-07-16 | Disposition: A | Discharge status: Home / Self Care | Payer: Commercial Managed Care - HMO | Attending: Emergency Medicine | Admitting: Emergency Medicine

## 2015-07-16 ENCOUNTER — Ambulatory Visit (INDEPENDENT_AMBULATORY_CARE_PROVIDER_SITE_OTHER): Payer: Commercial Managed Care - HMO | Admitting: Emergency Medicine

## 2015-07-16 ENCOUNTER — Emergency Department (HOSPITAL_COMMUNITY): Payer: Commercial Managed Care - HMO

## 2015-07-16 ENCOUNTER — Telehealth (HOSPITAL_COMMUNITY): Payer: Self-pay | Admitting: *Deleted

## 2015-07-16 VITALS — BP 101/66 | HR 84 | Temp 97.9°F | Resp 20 | Ht 68.0 in | Wt 235.0 lb

## 2015-07-16 DIAGNOSIS — R0602 Shortness of breath: Secondary | ICD-10-CM | POA: Diagnosis present

## 2015-07-16 DIAGNOSIS — Z9981 Dependence on supplemental oxygen: Secondary | ICD-10-CM | POA: Insufficient documentation

## 2015-07-16 DIAGNOSIS — Z8673 Personal history of transient ischemic attack (TIA), and cerebral infarction without residual deficits: Secondary | ICD-10-CM | POA: Insufficient documentation

## 2015-07-16 DIAGNOSIS — G4730 Sleep apnea, unspecified: Secondary | ICD-10-CM | POA: Diagnosis not present

## 2015-07-16 DIAGNOSIS — N183 Chronic kidney disease, stage 3 (moderate): Secondary | ICD-10-CM | POA: Diagnosis not present

## 2015-07-16 DIAGNOSIS — Z8619 Personal history of other infectious and parasitic diseases: Secondary | ICD-10-CM | POA: Diagnosis not present

## 2015-07-16 DIAGNOSIS — I509 Heart failure, unspecified: Secondary | ICD-10-CM | POA: Insufficient documentation

## 2015-07-16 DIAGNOSIS — Z79899 Other long term (current) drug therapy: Secondary | ICD-10-CM | POA: Insufficient documentation

## 2015-07-16 DIAGNOSIS — E119 Type 2 diabetes mellitus without complications: Secondary | ICD-10-CM | POA: Diagnosis not present

## 2015-07-16 DIAGNOSIS — J449 Chronic obstructive pulmonary disease, unspecified: Secondary | ICD-10-CM | POA: Insufficient documentation

## 2015-07-16 DIAGNOSIS — Z87891 Personal history of nicotine dependence: Secondary | ICD-10-CM | POA: Diagnosis not present

## 2015-07-16 DIAGNOSIS — E669 Obesity, unspecified: Secondary | ICD-10-CM | POA: Diagnosis not present

## 2015-07-16 DIAGNOSIS — I129 Hypertensive chronic kidney disease with stage 1 through stage 4 chronic kidney disease, or unspecified chronic kidney disease: Secondary | ICD-10-CM | POA: Insufficient documentation

## 2015-07-16 DIAGNOSIS — K219 Gastro-esophageal reflux disease without esophagitis: Secondary | ICD-10-CM | POA: Insufficient documentation

## 2015-07-16 DIAGNOSIS — E785 Hyperlipidemia, unspecified: Secondary | ICD-10-CM | POA: Diagnosis not present

## 2015-07-16 DIAGNOSIS — G8929 Other chronic pain: Secondary | ICD-10-CM | POA: Diagnosis not present

## 2015-07-16 LAB — CBC WITH DIFFERENTIAL/PLATELET
Basophils Absolute: 0 10*3/uL (ref 0.0–0.1)
Basophils Relative: 1 % (ref 0–1)
EOS ABS: 0.1 10*3/uL (ref 0.0–0.7)
Eosinophils Relative: 1 % (ref 0–5)
HCT: 37.3 % — ABNORMAL LOW (ref 39.0–52.0)
HEMOGLOBIN: 12 g/dL — AB (ref 13.0–17.0)
LYMPHS ABS: 1.9 10*3/uL (ref 0.7–4.0)
Lymphocytes Relative: 25 % (ref 12–46)
MCH: 28.8 pg (ref 26.0–34.0)
MCHC: 32.2 g/dL (ref 30.0–36.0)
MCV: 89.7 fL (ref 78.0–100.0)
MONOS PCT: 10 % (ref 3–12)
Monocytes Absolute: 0.7 10*3/uL (ref 0.1–1.0)
NEUTROS PCT: 63 % (ref 43–77)
Neutro Abs: 4.8 10*3/uL (ref 1.7–7.7)
Platelets: 203 10*3/uL (ref 150–400)
RBC: 4.16 MIL/uL — ABNORMAL LOW (ref 4.22–5.81)
RDW: 18.6 % — ABNORMAL HIGH (ref 11.5–15.5)
WBC: 7.6 10*3/uL (ref 4.0–10.5)

## 2015-07-16 LAB — COMPREHENSIVE METABOLIC PANEL
ALK PHOS: 73 U/L (ref 38–126)
ALT: 29 U/L (ref 17–63)
ANION GAP: 14 (ref 5–15)
AST: 36 U/L (ref 15–41)
Albumin: 4 g/dL (ref 3.5–5.0)
BILIRUBIN TOTAL: 0.6 mg/dL (ref 0.3–1.2)
BUN: 64 mg/dL — ABNORMAL HIGH (ref 6–20)
CALCIUM: 9.7 mg/dL (ref 8.9–10.3)
CO2: 24 mmol/L (ref 22–32)
Chloride: 98 mmol/L — ABNORMAL LOW (ref 101–111)
Creatinine, Ser: 2.65 mg/dL — ABNORMAL HIGH (ref 0.61–1.24)
GFR calc non Af Amer: 24 mL/min — ABNORMAL LOW (ref >60)
GFR, EST AFRICAN AMERICAN: 27 mL/min — AB (ref >60)
Glucose, Bld: 83 mg/dL (ref 65–99)
Potassium: 4 mmol/L (ref 3.5–5.1)
SODIUM: 136 mmol/L (ref 135–145)
TOTAL PROTEIN: 7.9 g/dL (ref 6.5–8.1)

## 2015-07-16 LAB — I-STAT TROPONIN, ED: TROPONIN I, POC: 0.04 ng/mL (ref 0.00–0.08)

## 2015-07-16 LAB — BRAIN NATRIURETIC PEPTIDE: B Natriuretic Peptide: 1032.5 pg/mL — ABNORMAL HIGH (ref 0.0–100.0)

## 2015-07-16 MED ORDER — FUROSEMIDE 10 MG/ML IJ SOLN
80.0000 mg | Freq: Once | INTRAMUSCULAR | Status: AC
  Administered 2015-07-16: 80 mg via INTRAVENOUS
  Filled 2015-07-16: qty 8

## 2015-07-16 NOTE — ED Notes (Signed)
Off unit with xray. 

## 2015-07-16 NOTE — ED Notes (Addendum)
Pt in from [facility] via [facility], per report pt c/o increased SOB  With bil leg swelling onset x 3 days, pt hx of CHF, pt d/c from this facility x 2 wks ago, pt A&O x4, c/o dsypnea, denies CP, n/v/d

## 2015-07-16 NOTE — ED Notes (Signed)
Ambulated around the nurses station and sats remained 100% on RA  HR 85

## 2015-07-16 NOTE — ED Notes (Signed)
Discharge instructions reviewed, voiced understanding.  

## 2015-07-16 NOTE — Telephone Encounter (Signed)
Left vm for pt to call back.  Pt needs to decrease Torsemide to 40mg  am and 20mg  pm (per [place])

## 2015-07-16 NOTE — Discharge Instructions (Signed)
1. Medications: usual home medications 2. Treatment: rest, take medications as directed 3. Follow Up: please followup with your cardiologist this week for discussion of your diagnoses and further evaluation after today's visit; please return to the ER for chest pain, dizziness, lightheadedness, passing out, worsening shortness of breath, fever, chills   Shortness of Breath Shortness of breath means you have trouble breathing. Shortness of breath needs medical care right away. HOME CARE   Do not smoke.  Avoid being around chemicals or things (paint fumes, dust) that may bother your breathing.  Rest as needed. Slowly begin your normal activities.  Only take medicines as told by your doctor.  Keep all doctor visits as told. GET HELP RIGHT AWAY IF:   Your shortness of breath gets worse.  You feel lightheaded, pass out (faint), or have a cough that is not helped by medicine.  You cough up blood.  You have pain with breathing.  You have pain in your chest, arms, shoulders, or belly (abdomen).  You have a fever.  You cannot walk up stairs or exercise the way you normally do.  You do not get better in the time expected.  You have a hard time doing normal activities even with rest.  You have problems with your medicines.  You have any new symptoms. MAKE SURE YOU:  Understand these instructions.  Will watch your condition.  Will get help right away if you are not doing well or get worse. Document Released: 04/26/2008 Document Revised: 11/13/2013 Document Reviewed: 01/24/2012 [facility] Patient Information 2015 [place], [place]. This information is not intended to replace advice given to you by your health care provider. Make sure you discuss any questions you have with your health care provider.

## 2015-07-16 NOTE — ED Notes (Signed)
Pt ambulated with no assistance. Pt started with an SPO2 at 92% and a HR of 96bpm. After pt started ambulating his O2 decreased to 84% it fluctuated between 84%-88% his HR increased to 120-130 at the end of his ambulation. His respirations also increased slightly. Pt tolerated well he denied pain or SOB upon ambulation.

## 2015-07-16 NOTE — Progress Notes (Signed)
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); PAF (paroxysmal atrial fibrillation); Hypertension; COPD (chronic obstructive pulmonary disease); Diabetes mellitus with nephropathy; CKD (chronic kidney disease), stage III; Polysubstance abuse; Chronic systolic heart failure; OSA (obstructive sleep apnea); Ocular herpes; Neuropathy; Gout; S/P left THA, AA; Non-ischemic cardiomyopathy- EF 15% echo Aug 2015; Chronic anticoagulation; CHF exacerbation; Acute on chronic systolic heart failure; Facial rash; Diabetes mellitus type 2, controlled; Essential hypertension; Chronic anemia; CHF (congestive heart failure); Herpes ocular; Acute renal failure superimposed on stage 3 chronic kidney disease; Dyspnea; and Acute exacerbation of CHF (congestive heart failure) on his problem list.  SUBJECTIVE  Frank Davidson is a 64 y.o. ill appearing male presenting for the chief complaint of SOB that started last Sunday. He has an EF of  He has has history of heart failure with an EF of 20% last measured in 11/26/2014.  He is maintained on Torsemide 20 mg daily and is also taking metolazone 1 daily as needed for fluid retention. His creatinine was last measured at 2.36 and his K was 3.5.  He was recently admitted for a heart failure exacerbation rouhgly 2 weeks ago with the chief complaint of shortness or breath and was diuresed from 245 to 229.  He reports his shortness of breath at his last admission was 8/10, and today he reports it as a 7/10.  He reports 1 pillow orthopnea increase from his normal.    He  has a past medical history of Atrial fibrillation; Hypertension; COPD (chronic obstructive pulmonary disease); Ocular herpes; Gout; CHF (congestive heart failure); Chronic pain; Hyperlipidemia; Nonischemic cardiomyopathy (Aug 2015); Ocular herpes; Neuropathy; Left-sided weakness; PVD (peripheral vascular disease) (May 2015); Memory impairment; CKD  (chronic kidney disease), stage III; Fibromyalgia; Sleep apnea with use of continuous positive airway pressure (CPAP); GERD (gastroesophageal reflux disease); Asthma; Type II diabetes mellitus; Stroke (X 5); Osteoarthritis; Arthritis; and Shortness of breath dyspnea.    Medications reviewed and updated by myself where necessary, and exist elsewhere in the encounter.   Frank Davidson is allergic to corlanor; [place]; lunesta; and actos. He  reports that he quit smoking about 8 years ago. His smoking use included Cigarettes. He smoked 0.00 packs per day for 0 years. He has never used smokeless tobacco. He reports that he uses illicit drugs (Cocaine and Marijuana). He reports that he does not drink alcohol. He  has no sexual activity history on file. The patient  has past surgical history that includes Orchiectomy (Left); Tonsillectomy (age 47); Esophagogastroduodenoscopy (N/A, 12/24/2013); Colonoscopy (N/A, 12/24/2013); Joint replacement; Cardiac catheterization (10/2006); Cataract extraction w/ intraocular lens  implant, bilateral (Bilateral, ~ 2008); Total hip arthroplasty (Left, 09/12/2014); and right heart catheterization (N/A, 08/13/2014).  His family history includes Cancer in his mother; Diabetes in his father and sister; Hypertension in his brother, mother, sister, and sister; Lung disease in his mother. There is no history of Colon cancer.  Review of Systems  Constitutional: Negative for fever and chills.  Respiratory: Positive for cough and shortness of breath. Negative for hemoptysis and wheezing.   Cardiovascular: Negative for chest pain.  Gastrointestinal: Negative for abdominal pain.  Skin: Negative for rash.  Neurological: Negative for dizziness.    OBJECTIVE  His  height is 5\' 8"  (1.727 m) and weight is 235 lb (106.595 kg). His oral temperature is 97.9 F (36.6 C). His blood pressure is 101/66 and his pulse is 84. His respiration is  20 and oxygen saturation is 97%.  The patient's body mass  index is 35.74 kg/(m^2).  Physical Exam  Vitals reviewed. Constitutional: He is oriented to person, place, and time. He appears well-developed. No distress.  Eyes: EOM are normal. Pupils are equal, round, and reactive to light. No scleral icterus.  Neck: Normal range of motion.  Cardiovascular: Normal rate and regular rhythm.   Respiratory: Effort normal and breath sounds normal.  GI: He exhibits no distension.  Musculoskeletal: Normal range of motion.  Neurological: He is alert and oriented to person, place, and time. No cranial nerve deficit.  Skin: Skin is warm and dry. No rash noted. He is not diaphoretic.  Psychiatric: He has a normal mood and affect.    No results found for this or any previous visit (from the past 24 hour(s)).  ASSESSMENT & PLAN  Frank Davidson was seen today for follow-up.  Diagnoses and all orders for this visit:  Shortness of breath -     EKG 12-Lead  CHF exacerbation: Patient with history of systolic failure EF at 20% measured last January now short of breath and with increased orthopnea.  His lungs are clear to auscultation.  Send to [facility] via ambulance for further eval and possible admission.    The patient was advised to call or come back to clinic if he does not see an improvement in symptoms, or worsens with the above plan.   Deliah Boston, MHS, PA-C [facility] 07/16/2015 2:20 PM

## 2015-07-16 NOTE — Telephone Encounter (Signed)
Pt in ED.  

## 2015-07-16 NOTE — ED Provider Notes (Signed)
CSN: [id number]     Arrival date & time 07/16/15  1429 History   None    No chief complaint on file.   HPI   Frank Davidson is a 64 y.o. male with a PMH of atrial fibrillation on eliquis, HTN, HLD, COPD, CHF, CKD, DM who presents to the ED with shortness of breath, which began on Monday. He reports his shortness of breath is constant. He states movement and activity exacerbate his shortness of breath and resting helps to relieve his symptoms. He denies chest pain and palpitations. He reports lower extremity edema, which he states is unchanged from baseline. He denies fever, chills, cough, congestion, abdominal pain, vomiting, diarrhea, constipation, lightheadedness, dizziness, syncope, weakness. He reports nausea. He denies history of malignancy, DVT, recent travel or immobility. States he was seen 2 weeks ago for herpes zoster to his right eye, denies any other recent illness. Reports he ran out of hydralazine 2 weeks ago and did not get it refilled.   Past Medical History  Diagnosis Date  . Atrial fibrillation     PAF  . Hypertension   . COPD (chronic obstructive pulmonary disease)   . Ocular herpes   . Gout   . CHF (congestive heart failure)   . Chronic pain   . Hyperlipidemia   . Nonischemic cardiomyopathy Aug 2015    EF 15%  . Ocular herpes   . Neuropathy   . Left-sided weakness     "because of the arthritis and edema"  . PVD (peripheral vascular disease) May 2015    abnormal ABIs  . Memory impairment     "short term"  . CKD (chronic kidney disease), stage III     Dr. Mercy Moore follows-holding   . Fibromyalgia     neuropathy- hands, more than feet.  . Sleep apnea with use of continuous positive airway pressure (CPAP)     does not use cpap  . GERD (gastroesophageal reflux disease)     tums as needed  . Asthma   . Type II diabetes mellitus   . Stroke X 5    last stroke aug 2014"; denies residual on 09/12/2014  . Osteoarthritis   . Arthritis     "knees, right elbow,  shoulder" (09/12/2014)  . Shortness of breath dyspnea    Past Surgical History  Procedure Laterality Date  . 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 Tonsillectomy  age 54  . Esophagogastroduodenoscopy N/A 12/24/2013    Procedure: ESOPHAGOGASTRODUODENOSCOPY (EGD);  Surgeon: Inda Castle, MD;  Location: [facility];  Service: Endoscopy;  Laterality: N/A;  . Colonoscopy N/A 12/24/2013    Procedure: COLONOSCOPY;  Surgeon: Inda Castle, MD;  Location: [facility];  Service: Endoscopy;  Laterality: N/A;  . Joint replacement    . Cardiac catheterization  10/2006    normal coronary arteries;   Marland Kitchen Cataract extraction w/ intraocular lens  implant, bilateral Bilateral ~ 2008  . Total hip arthroplasty Left 09/12/2014    Procedure: LEFT TOTAL HIP ARTHROPLASTY ANTERIOR APPROACH;  Surgeon: Mauri Pole, MD;  Location: [facility];  Service: Orthopedics;  Laterality: Left;  . Right heart catheterization N/A 08/13/2014    Rt ht cath prior to hip surgery   Family History  Problem Relation Age of Onset  . Cancer Mother   . Hypertension Mother   . Lung disease Mother   . Diabetes Father   . Diabetes Sister   . Hypertension Sister   . Hypertension Brother   .  Hypertension Sister   . Colon cancer Neg Hx    Social History  Substance Use Topics  . Smoking status: Former Smoker -- 0.00 packs/day for 0 years    Types: Cigarettes    Quit date: 12/18/2006  . Smokeless tobacco: Never Used  . Alcohol Use: No     Comment: Quit 2008     Review of Systems  Constitutional: Negative for fever, chills, activity change, appetite change and fatigue.  HENT: Negative for congestion.   Eyes: Negative for visual disturbance.  Respiratory: Positive for shortness of breath. Negative for cough, wheezing and stridor.   Cardiovascular: Positive for leg swelling. Negative for chest pain and palpitations.  Gastrointestinal: Negative for nausea, vomiting, abdominal pain, diarrhea, constipation and abdominal distention.   Genitourinary: Negative for dysuria, urgency and frequency.  Musculoskeletal: Negative for myalgias, back pain, arthralgias, neck pain and neck stiffness.  Skin: Negative for color change, pallor, rash and wound.  Neurological: Negative for dizziness, syncope, weakness, light-headedness, numbness and headaches.  All other systems reviewed and are negative.     Allergies  Corlanor; Januvia; Lunesta; and Actos  Home Medications   Prior to Admission medications   Medication Sig Start Date End Date Taking? Authorizing Provider  acetaminophen (TYLENOL) 500 MG tablet Take 500 mg by mouth every 6 (six) hours as needed for moderate pain or headache.   Yes Historical Provider, MD  albuterol (PROVENTIL HFA;VENTOLIN HFA) 108 (90 BASE) MCG/ACT inhaler Inhale 2 puffs into the lungs every 6 (six) hours as needed for wheezing or shortness of breath. 06/30/15  Yes Gay Filler Copland, MD  ALPHA LIPOIC ACID PO Take 1 capsule by mouth daily.   Yes Historical Provider, MD  apixaban (ELIQUIS) 5 MG TABS tablet Take 1 tablet (5 mg total) by mouth 2 (two) times daily. 02/04/15  Yes Jolaine Artist, MD  Ascorbic Acid (VITAMIN C PO) Take 1 tablet by mouth daily.   Yes Historical Provider, MD  b complex vitamins tablet Take 1 tablet by mouth daily.   Yes Historical Provider, MD  baclofen (LIORESAL) 10 MG tablet TAKE 1 TABLET EVERY 8 HOURS AS NEEDED FOR MUSCLE SPASM(S) 03/28/15  Yes Gay Filler Copland, MD  carvedilol (COREG) 25 MG tablet TAKE 1/2 TABLET TWICE DAILY  WITH  A  MEAL 04/17/15  Yes Jolaine Artist, MD  Celery Seed OIL Take 1 capsule by mouth daily.   Yes Historical Provider, MD  Coenzyme Q10 (COQ10) 100 MG CAPS Take 100 mg by mouth 2 (two) times daily.   Yes Historical Provider, MD  diclofenac (FLECTOR) 1.3 % PTCH Place 1 patch onto the skin 2 (two) times daily as needed (pain).   Yes Historical Provider, MD  docusate sodium 100 MG CAPS Take 100 mg by mouth 2 (two) times daily. 09/16/14  Yes Matthew  Babish, PA-C  glipiZIDE (GLUCOTROL) 5 MG tablet Take 1 tablet (5 mg total) by mouth 2 (two) times daily. 12/20/14  Yes Gay Filler Copland, MD  HAWTHORN EXTRACT PO Take 1 capsule by mouth daily.   Yes Historical Provider, MD  hydrALAZINE (APRESOLINE) 50 MG tablet Take 1 tablet (50 mg total) by mouth 3 (three) times daily. 07/11/15  Yes Larey Dresser, MD  isosorbide mononitrate (IMDUR) 60 MG 24 hr tablet TAKE 1 TABLET (60 MG TOTAL) BY MOUTH DAILY. 01/03/15  Yes Jolaine Artist, MD  metolazone (ZAROXOLYN) 5 MG tablet Take 5 mg by mouth daily as needed (for fluid).  04/16/15  Yes Historical Provider, MD  omega-3 acid ethyl esters (LOVAZA) 1 G capsule Take 1 g by mouth daily.   Yes Historical Provider, MD  oxyCODONE-acetaminophen (PERCOCET) 10-325 MG per tablet Take 1 tablet by mouth 4 (four) times daily. scheduled 11/16/14  Yes Historical Provider, MD  polyethylene glycol powder (GLYCOLAX/MIRALAX) powder MIX 1 CAPFUL (17 GM) IN LIQUID AND DRINK EVERY DAY AS DIRECTED 03/28/15  Yes Gay Filler Copland, MD  potassium chloride SA (K-DUR,KLOR-CON) 20 MEQ tablet Take 1 tablet (20 mEq total) by mouth daily. 06/30/15  Yes Gay Filler Copland, MD  prednisoLONE acetate (PRED FORTE) 1 % ophthalmic suspension Place 1 drop into the right eye 4 (four) times daily. Patient taking differently: Place 1 drop into both eyes 4 (four) times daily.  06/24/15  Yes Barton Dubois, MD  spironolactone (ALDACTONE) 25 MG tablet Take 0.5 tablets (12.5 mg total) by mouth daily. 07/04/15  Yes Asiyah Cletis Media, MD  torsemide (DEMADEX) 20 MG tablet Take 2 tablets (40 mg total) by mouth 2 (two) times daily. 06/30/15  Yes Gay Filler Copland, MD  VITAMIN E PO Take 1 capsule by mouth daily.   Yes Historical Provider, MD  ACCU-CHEK FASTCLIX LANCETS MISC Test blood sugar once daily. Dx code: E11.8 12/20/14   Darreld Mclean, MD  Alcohol Swabs (B-D SINGLE USE SWABS REGULAR) PADS Test blood sugar daily. Dx code: E11.8 12/20/14   Darreld Mclean, MD   allopurinol (ZYLOPRIM) 100 MG tablet Take 100 mg by mouth daily.    Historical Provider, MD  Blood Glucose Calibration (ACCU-CHEK SMARTVIEW CONTROL) LIQD Test blood sugar once daily. Dx code: E11.8. 12/20/14   Darreld Mclean, MD  Blood Glucose Monitoring Suppl (ACCU-CHEK NANO SMARTVIEW) W/DEVICE KIT Test blood sugar daily. Dx code: E11.8 12/20/14   Darreld Mclean, MD  colchicine 0.6 MG tablet Take 1.2 mg by mouth daily as needed (Gout). Colcrys    Historical Provider, MD  glucose blood (ACCU-CHEK SMARTVIEW) test strip Test blood sugar once daily. Dx code: E11.8 12/20/14   Darreld Mclean, MD  valACYclovir (VALTREX) 1000 MG tablet Take 1 tablet (1,000 mg total) by mouth 3 (three) times daily. Patient not taking: Reported on 07/16/2015 06/24/15   Barton Dubois, MD    BP 90/69 mmHg  Pulse 68  Temp(Src) 97.6 F (36.4 C) (Oral)  Resp 20  SpO2 99% Physical Exam  Constitutional: He is oriented to person, place, and time. No distress.  Obese male in no acute distress.  HENT:  Head: Normocephalic and atraumatic.  Right Ear: External ear normal.  Left Ear: External ear normal.  Nose: Nose normal.  Mouth/Throat: Oropharynx is clear and moist. No oropharyngeal exudate.  Eyes: Conjunctivae and EOM are normal. Pupils are equal, round, and reactive to light. Right eye exhibits no discharge. Left eye exhibits no discharge.  Neck: Normal range of motion. Neck supple.  Difficult to assess JVD given patient's body habitus.  Cardiovascular: Normal rate, regular rhythm, normal heart sounds and intact distal pulses.   Pulmonary/Chest: Effort normal and breath sounds normal. No accessory muscle usage. No respiratory distress. He has no wheezes. He has no rales. He exhibits no tenderness.  Abdominal: Soft. Bowel sounds are normal. He exhibits distension. He exhibits no mass. There is no tenderness. There is no rebound and no guarding.  Mild abdominal distention. No tenderness to palpation, rebound,  guarding, or masses.  Musculoskeletal: Normal range of motion. He exhibits edema. He exhibits no tenderness.  Trace lower extremity edema bilaterally.  Neurological: He is alert and  oriented to person, place, and time. He has normal strength. No cranial nerve deficit or sensory deficit.  Skin: Skin is warm and dry. No rash noted. He is not diaphoretic. No erythema. No pallor.  Psychiatric: He has a normal mood and affect. His behavior is normal. Judgment and thought content normal.  Nursing note and vitals reviewed.   ED Course  Procedures (including critical care time)  Labs Review Labs Reviewed  CBC WITH DIFFERENTIAL/PLATELET - Abnormal; Notable for the following:    RBC 4.16 (*)    Hemoglobin 12.0 (*)    HCT 37.3 (*)    RDW 18.6 (*)    All other components within normal limits  COMPREHENSIVE METABOLIC PANEL - Abnormal; Notable for the following:    Chloride 98 (*)    BUN 64 (*)    Creatinine, Ser 2.65 (*)    GFR calc non Af Amer 24 (*)    GFR calc Af Amer 27 (*)    All other components within normal limits  BRAIN NATRIURETIC PEPTIDE - Abnormal; Notable for the following:    B Natriuretic Peptide 1032.5 (*)    All other components within normal limits  I-STAT TROPOININ, ED    Imaging Review Dg Chest 2 View  07/16/2015   CLINICAL DATA:  Shortness of breath, hypertension  EXAM: CHEST  2 VIEW  COMPARISON:  07/03/2015  FINDINGS: Enlargement of cardiac silhouette.  Mediastinal contours and pulmonary vascularity normal.  Linear subsegmental atelectasis versus scarring in mid to lower lungs bilaterally slightly greater on RIGHT.  No acute infiltrate, pleural effusion or pneumothorax.  Osseous structures unremarkable.  IMPRESSION: Enlargement of cardiac silhouette.  Subsegmental atelectasis versus scarring in the mid to lower lungs unchanged.  No acute abnormalities.   Electronically Signed   By: Lavonia Dana M.D.   On: 07/16/2015 16:47     I have personally reviewed and evaluated  these images and lab results as part of my medical decision-making.   EKG Interpretation   Date/Time:  Wednesday July 16 2015 14:38:05 EDT Ventricular Rate:  55 PR Interval:  268 QRS Duration: 118 QT Interval:  438 QTC Calculation: 419 R Axis:   -47 Text Interpretation:  Sinus bradycardia Ventricular trigeminy Prolonged PR  interval Left anterior fascicular block Low voltage, extremity leads  Nonspecific T abnormalities, lateral leads Confirmed by Maryan Rued  MD,  Loree Fee ([id number]) on 07/16/2015 2:41:21 PM      MDM   Final diagnoses:  Shortness of breath    64 year old male presents with worsening shortness of breath, which started Monday. Denies chest pain, palpitations, fever, chills, cough, congestion, abdominal pain, vomiting, diarrhea, constipation, lightheadedness, dizziness, syncope, weakness.   Patient is afebrile. Vital signs stable. No acute distress, patient resting comfortably. Lungs clear to auscultation, trace lower extremity edema on exam. JVD difficult to assess given patient's body habitus. AAOx4.    O2 sat with ambulation 84%, HR increased to 120-130. Orthostatic vitals 109/86 lying, 106/53 sitting, 110/68 standing.  Last echo 11/26/14 EF 20%. BNP elevated at 1032.5. Weight 235. EKG no evidence of acute ischemia. Troponin negative x 1. No evidence of ACS. CXR with enlargement of cardiac silhouette and subsegmental atelectasis, no acute abnormalities. No evidence of pneumonia. No leukocytosis on CBC. CMP demonstrates elevated BUN and creatine, consistent with CKD. No history of DVT, malignancy, recent immobility, or travel. No chest pain, patient not tachycardic at rest, no unilateral lower extremity edema. Doubt PE.  Cardiology consulted. Discussed patient with Dr. Marlou Porch, who recommended giving  the patient 80 mg IV lasix. Patient seen by Dr. Kirk Ruths in clinic on 07/11/15, weight stable since that time, per cardiology, felt to be at baseline.  Output 800 ml s/p lasix  administration. O2 sat 95-99% on RA. Patient reports improvement in shortness of breath. O2 sat with ambulation remained approximately 100% per nursing with HR 85.  Patient stable for discharge. Return precautions discussed. Follow-up with cardiology.   BP 90/69 mmHg  Pulse 68  Temp(Src) 97.6 F (36.4 C) (Oral)  Resp 20  SpO2 99%      Marella Chimes, PA-C 07/17/15 [place], DO 07/18/15 1357

## 2015-07-16 NOTE — ED Notes (Signed)
Patient c/o cramps in his ankles.  States he takes potassium pills at home

## 2015-07-17 ENCOUNTER — Encounter: Payer: Self-pay | Admitting: *Deleted

## 2015-07-17 DIAGNOSIS — Z006 Encounter for examination for normal comparison and control in clinical research program: Secondary | ICD-10-CM

## 2015-07-17 NOTE — Telephone Encounter (Signed)
Left detailed message on pt's voice mail

## 2015-07-17 NOTE — Progress Notes (Signed)
Patient was seen for Guide It study visit on 07/11/15.B/P 120/76 Pulse 87 Respirations 20 and O2 sat 96%. Weight 235lbs. Patient received letter about the closing of guide It Study and verbalized understanding. Patient will continue to follow-up in CHF clinic as scheduled.

## 2015-07-18 ENCOUNTER — Emergency Department (HOSPITAL_COMMUNITY): Payer: Commercial Managed Care - HMO

## 2015-07-18 ENCOUNTER — Encounter (HOSPITAL_COMMUNITY): Payer: Self-pay | Admitting: Emergency Medicine

## 2015-07-18 ENCOUNTER — Emergency Department (HOSPITAL_COMMUNITY)
Admission: EM | Admit: 2015-07-18 | Discharge: 2015-07-18 | Disposition: A | Discharge status: Home / Self Care | Payer: Commercial Managed Care - HMO | Attending: Emergency Medicine | Admitting: Emergency Medicine

## 2015-07-18 DIAGNOSIS — I129 Hypertensive chronic kidney disease with stage 1 through stage 4 chronic kidney disease, or unspecified chronic kidney disease: Secondary | ICD-10-CM | POA: Diagnosis not present

## 2015-07-18 DIAGNOSIS — Z8673 Personal history of transient ischemic attack (TIA), and cerebral infarction without residual deficits: Secondary | ICD-10-CM | POA: Insufficient documentation

## 2015-07-18 DIAGNOSIS — Z79899 Other long term (current) drug therapy: Secondary | ICD-10-CM | POA: Insufficient documentation

## 2015-07-18 DIAGNOSIS — N183 Chronic kidney disease, stage 3 (moderate): Secondary | ICD-10-CM | POA: Diagnosis not present

## 2015-07-18 DIAGNOSIS — K219 Gastro-esophageal reflux disease without esophagitis: Secondary | ICD-10-CM | POA: Diagnosis not present

## 2015-07-18 DIAGNOSIS — E785 Hyperlipidemia, unspecified: Secondary | ICD-10-CM | POA: Insufficient documentation

## 2015-07-18 DIAGNOSIS — J441 Chronic obstructive pulmonary disease with (acute) exacerbation: Secondary | ICD-10-CM | POA: Diagnosis not present

## 2015-07-18 DIAGNOSIS — R0602 Shortness of breath: Secondary | ICD-10-CM

## 2015-07-18 DIAGNOSIS — Z87891 Personal history of nicotine dependence: Secondary | ICD-10-CM | POA: Diagnosis not present

## 2015-07-18 DIAGNOSIS — I509 Heart failure, unspecified: Secondary | ICD-10-CM | POA: Insufficient documentation

## 2015-07-18 DIAGNOSIS — G8929 Other chronic pain: Secondary | ICD-10-CM | POA: Diagnosis not present

## 2015-07-18 DIAGNOSIS — E119 Type 2 diabetes mellitus without complications: Secondary | ICD-10-CM | POA: Insufficient documentation

## 2015-07-18 DIAGNOSIS — M1990 Unspecified osteoarthritis, unspecified site: Secondary | ICD-10-CM | POA: Insufficient documentation

## 2015-07-18 LAB — COMPREHENSIVE METABOLIC PANEL
ALK PHOS: 78 U/L (ref 38–126)
ALT: 26 U/L (ref 17–63)
ANION GAP: 12 (ref 5–15)
AST: 38 U/L (ref 15–41)
Albumin: 4 g/dL (ref 3.5–5.0)
BUN: 69 mg/dL — ABNORMAL HIGH (ref 6–20)
CALCIUM: 9.7 mg/dL (ref 8.9–10.3)
CHLORIDE: 95 mmol/L — AB (ref 101–111)
CO2: 29 mmol/L (ref 22–32)
Creatinine, Ser: 2.84 mg/dL — ABNORMAL HIGH (ref 0.61–1.24)
GFR calc non Af Amer: 22 mL/min — ABNORMAL LOW (ref >60)
GFR, EST AFRICAN AMERICAN: 25 mL/min — AB (ref >60)
Glucose, Bld: 89 mg/dL (ref 65–99)
Potassium: 3.7 mmol/L (ref 3.5–5.1)
SODIUM: 136 mmol/L (ref 135–145)
Total Bilirubin: 0.9 mg/dL (ref 0.3–1.2)
Total Protein: 8.3 g/dL — ABNORMAL HIGH (ref 6.5–8.1)

## 2015-07-18 LAB — CBC WITH DIFFERENTIAL/PLATELET
Basophils Absolute: 0.1 10*3/uL (ref 0.0–0.1)
Basophils Relative: 1 % (ref 0–1)
EOS ABS: 0.2 10*3/uL (ref 0.0–0.7)
EOS PCT: 2 % (ref 0–5)
HCT: 39 % (ref 39.0–52.0)
Hemoglobin: 12.3 g/dL — ABNORMAL LOW (ref 13.0–17.0)
LYMPHS ABS: 1.8 10*3/uL (ref 0.7–4.0)
Lymphocytes Relative: 25 % (ref 12–46)
MCH: 28.5 pg (ref 26.0–34.0)
MCHC: 31.5 g/dL (ref 30.0–36.0)
MCV: 90.5 fL (ref 78.0–100.0)
MONOS PCT: 11 % (ref 3–12)
Monocytes Absolute: 0.8 10*3/uL (ref 0.1–1.0)
Neutro Abs: 4.5 10*3/uL (ref 1.7–7.7)
Neutrophils Relative %: 61 % (ref 43–77)
PLATELETS: 245 10*3/uL (ref 150–400)
RBC: 4.31 MIL/uL (ref 4.22–5.81)
RDW: 18.6 % — ABNORMAL HIGH (ref 11.5–15.5)
WBC: 7.4 10*3/uL (ref 4.0–10.5)

## 2015-07-18 LAB — BRAIN NATRIURETIC PEPTIDE: B Natriuretic Peptide: 523.3 pg/mL — ABNORMAL HIGH (ref 0.0–100.0)

## 2015-07-18 LAB — I-STAT TROPONIN, ED: TROPONIN I, POC: 0.05 ng/mL (ref 0.00–0.08)

## 2015-07-18 MED ORDER — IPRATROPIUM BROMIDE 0.02 % IN SOLN
0.5000 mg | Freq: Once | RESPIRATORY_TRACT | Status: AC
  Administered 2015-07-18: 0.5 mg via RESPIRATORY_TRACT
  Filled 2015-07-18: qty 2.5

## 2015-07-18 MED ORDER — ALBUTEROL SULFATE (2.5 MG/3ML) 0.083% IN NEBU
5.0000 mg | INHALATION_SOLUTION | Freq: Once | RESPIRATORY_TRACT | Status: AC
  Administered 2015-07-18: 5 mg via RESPIRATORY_TRACT
  Filled 2015-07-18: qty 6

## 2015-07-18 NOTE — ED Notes (Addendum)
Ambulated pt on the unit pt ambulated well  Pt saturations remained att 99% room-air pt complains of SOB wheezing also noted while ambulating

## 2015-07-18 NOTE — ED Notes (Signed)
Pt placed on 2 liters O2 N/c , pt in nad but states that he feels short of breath , pt does states that he has been having to sleep propped up on pillows

## 2015-07-18 NOTE — ED Notes (Signed)
Pt here from home with c/o sob that started last night , pt is have some pain off and on around the right flank , pt has some swelling noted the abd

## 2015-07-18 NOTE — Discharge Instructions (Signed)
Please read and follow all provided instructions.  Your diagnoses today include:  1. Shortness of breath    Tests performed today include:  An EKG of your heart  A chest x-ray  Cardiac enzymes - a blood test for heart muscle damage  Blood counts and electrolytes  Blood test for heart failure  Vital signs. See below for your results today.   Medications prescribed:   None  Take any prescribed medications only as directed.  Follow-up instructions: Please follow-up with your primary care provider as soon as you can for further evaluation of your symptoms.   Return instructions:  SEEK IMMEDIATE MEDICAL ATTENTION IF:  You have severe chest pain, especially if the pain is crushing or pressure-like and spreads to the arms, back, neck, or jaw, or if you have sweating, nausea (feeling sick to your stomach), or shortness of breath. THIS IS AN EMERGENCY. Don't wait to see if the pain will go away. Get medical help at once. Call 911 or [phone] (operator). DO NOT drive yourself to the hospital.   Your chest pain gets worse and does not go away with rest.   You have an attack of chest pain lasting longer than usual, despite rest and treatment with the medications your caregiver has prescribed.   You wake from sleep with chest pain or shortness of breath.  You feel dizzy or faint.  You have chest pain and shortness of breath that is not typical of your usual symptoms.   You have any other emergent concerns regarding your health.   Your vital signs today were: BP 118/87 mmHg   Pulse 66   Temp(Src) 97.4 F (36.3 C) (Oral)   Resp 18   Ht 5\' 9"  (1.753 m)   Wt 228 lb (103.42 kg)   BMI 33.65 kg/m2   SpO2 97% If your blood pressure (BP) was elevated above 135/85 this visit, please have this repeated by your doctor within one month. --------------

## 2015-07-18 NOTE — ED Provider Notes (Signed)
CSN: [id number]     Arrival date & time 07/18/15  1114 History   First MD Initiated Contact with Patient 07/18/15 1218     Chief Complaint  Patient presents with  . Shortness of Breath    (Consider location/radiation/quality/duration/timing/severity/associated sxs/prior Treatment) HPI Comments: Patient with history of stroke, hypertension, COPD, diabetes, chronic kidney disease, systolic heart failure on Lasix -- presents with complaints of shortness of breath. This has been ongoing for the past 4 days. Patient was seen in emergency department 2 days ago and was treated for shortness of breath due to CHF with Lasix. He was reportedly improved and discharged home. Patient states that the shortness of breath has continued to worsen over the past 2 days. He denies worsening lower extremity edema. No fevers. He has had a cough. No chest pain. Shortness of breath is worse with exertion. He states that he feels more comfortable breathing while sitting up. The onset of this condition was acute. The course is constant.   The history is provided by the patient and medical records.    Past Medical History  Diagnosis Date  . Atrial fibrillation     PAF  . Hypertension   . COPD (chronic obstructive pulmonary disease)   . Ocular herpes   . Gout   . CHF (congestive heart failure)   . Chronic pain   . Hyperlipidemia   . Nonischemic cardiomyopathy Aug 2015    EF 15%  . Ocular herpes   . Neuropathy   . Left-sided weakness     "because of the arthritis and edema"  . PVD (peripheral vascular disease) May 2015    abnormal ABIs  . Memory impairment     "short term"  . CKD (chronic kidney disease), stage III     Dr. Mercy Moore follows-holding   . Fibromyalgia     neuropathy- hands, more than feet.  . Sleep apnea with use of continuous positive airway pressure (CPAP)     does not use cpap  . GERD (gastroesophageal reflux disease)     tums as needed  . Asthma   . Type II diabetes mellitus   .  Stroke X 5    last stroke aug 2014"; denies residual on 09/12/2014  . Osteoarthritis   . Arthritis     "knees, right elbow, shoulder" (09/12/2014)  . Shortness of breath dyspnea    Past Surgical History  Procedure Laterality Date  . 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 Tonsillectomy  age 69  . Esophagogastroduodenoscopy N/A 12/24/2013    Procedure: ESOPHAGOGASTRODUODENOSCOPY (EGD);  Surgeon: Inda Castle, MD;  Location: [facility];  Service: Endoscopy;  Laterality: N/A;  . Colonoscopy N/A 12/24/2013    Procedure: COLONOSCOPY;  Surgeon: Inda Castle, MD;  Location: [facility];  Service: Endoscopy;  Laterality: N/A;  . Joint replacement    . Cardiac catheterization  10/2006    normal coronary arteries;   Marland Kitchen Cataract extraction w/ intraocular lens  implant, bilateral Bilateral ~ 2008  . Total hip arthroplasty Left 09/12/2014    Procedure: LEFT TOTAL HIP ARTHROPLASTY ANTERIOR APPROACH;  Surgeon: Mauri Pole, MD;  Location: [facility];  Service: Orthopedics;  Laterality: Left;  . Right heart catheterization N/A 08/13/2014    Rt ht cath prior to hip surgery   Family History  Problem Relation Age of Onset  . Cancer Mother   . Hypertension Mother   . Lung disease Mother   . Diabetes Father   .  Diabetes Sister   . Hypertension Sister   . Hypertension Brother   . Hypertension Sister   . Colon cancer Neg Hx    Social History  Substance Use Topics  . Smoking status: Former Smoker -- 0.00 packs/day for 0 years    Types: Cigarettes    Quit date: 12/18/2006  . Smokeless tobacco: Never Used  . Alcohol Use: No     Comment: Quit 2008    Review of Systems  Constitutional: Negative for fever.  HENT: Negative for rhinorrhea and sore throat.   Eyes: Negative for redness.  Respiratory: Positive for cough and shortness of breath.   Cardiovascular: Negative for chest pain and leg swelling.  Gastrointestinal: Negative for nausea, vomiting, abdominal pain and diarrhea.  Genitourinary:  Negative for dysuria.  Musculoskeletal: Negative for myalgias.  Skin: Negative for rash.  Neurological: Negative for headaches.    Allergies  Corlanor; Januvia; Lunesta; and Actos  Home Medications   Prior to Admission medications   Medication Sig Start Date End Date Taking? Authorizing Provider  ACCU-CHEK FASTCLIX LANCETS MISC Test blood sugar once daily. Dx code: E11.8 12/20/14   Darreld Mclean, MD  acetaminophen (TYLENOL) 500 MG tablet Take 500 mg by mouth every 6 (six) hours as needed for moderate pain or headache.    Historical Provider, MD  albuterol (PROVENTIL HFA;VENTOLIN HFA) 108 (90 BASE) MCG/ACT inhaler Inhale 2 puffs into the lungs every 6 (six) hours as needed for wheezing or shortness of breath. 06/30/15   Darreld Mclean, MD  Alcohol Swabs (B-D SINGLE USE SWABS REGULAR) PADS Test blood sugar daily. Dx code: E11.8 12/20/14   Darreld Mclean, MD  allopurinol (ZYLOPRIM) 100 MG tablet Take 100 mg by mouth daily.    Historical Provider, MD  ALPHA LIPOIC ACID PO Take 1 capsule by mouth daily.    Historical Provider, MD  apixaban (ELIQUIS) 5 MG TABS tablet Take 1 tablet (5 mg total) by mouth 2 (two) times daily. 02/04/15   Jolaine Artist, MD  Ascorbic Acid (VITAMIN C PO) Take 1 tablet by mouth daily.    Historical Provider, MD  b complex vitamins tablet Take 1 tablet by mouth daily.    Historical Provider, MD  baclofen (LIORESAL) 10 MG tablet TAKE 1 TABLET EVERY 8 HOURS AS NEEDED FOR MUSCLE SPASM(S) 03/28/15   Gay Filler Copland, MD  Blood Glucose Calibration (ACCU-CHEK SMARTVIEW CONTROL) LIQD Test blood sugar once daily. Dx code: E11.8. 12/20/14   Darreld Mclean, MD  Blood Glucose Monitoring Suppl (ACCU-CHEK NANO SMARTVIEW) W/DEVICE KIT Test blood sugar daily. Dx code: E11.8 12/20/14   Darreld Mclean, MD  carvedilol (COREG) 25 MG tablet TAKE 1/2 TABLET TWICE DAILY  WITH  A  MEAL 04/17/15   Jolaine Artist, MD  Celery Seed OIL Take 1 capsule by mouth daily.    Historical  Provider, MD  Coenzyme Q10 (COQ10) 100 MG CAPS Take 100 mg by mouth 2 (two) times daily.    Historical Provider, MD  colchicine 0.6 MG tablet Take 1.2 mg by mouth daily as needed (Gout). Colcrys    Historical Provider, MD  diclofenac (FLECTOR) 1.3 % PTCH Place 1 patch onto the skin 2 (two) times daily as needed (pain).    Historical Provider, MD  docusate sodium 100 MG CAPS Take 100 mg by mouth 2 (two) times daily. 09/16/14   Danae Orleans, PA-C  glipiZIDE (GLUCOTROL) 5 MG tablet Take 1 tablet (5 mg total) by mouth 2 (two) times daily.  12/20/14   Gay Filler Copland, MD  glucose blood (ACCU-CHEK SMARTVIEW) test strip Test blood sugar once daily. Dx code: E11.8 12/20/14   Darreld Mclean, MD  HAWTHORN EXTRACT PO Take 1 capsule by mouth daily.    Historical Provider, MD  hydrALAZINE (APRESOLINE) 50 MG tablet Take 1 tablet (50 mg total) by mouth 3 (three) times daily. 07/11/15   Larey Dresser, MD  isosorbide mononitrate (IMDUR) 60 MG 24 hr tablet TAKE 1 TABLET (60 MG TOTAL) BY MOUTH DAILY. 01/03/15   Jolaine Artist, MD  metolazone (ZAROXOLYN) 5 MG tablet Take 5 mg by mouth daily as needed (for fluid).  04/16/15   Historical Provider, MD  omega-3 acid ethyl esters (LOVAZA) 1 G capsule Take 1 g by mouth daily.    Historical Provider, MD  oxyCODONE-acetaminophen (PERCOCET) 10-325 MG per tablet Take 1 tablet by mouth 4 (four) times daily. scheduled 11/16/14   Historical Provider, MD  polyethylene glycol powder (GLYCOLAX/MIRALAX) powder MIX 1 CAPFUL (17 GM) IN LIQUID AND DRINK EVERY DAY AS DIRECTED 03/28/15   Gay Filler Copland, MD  potassium chloride SA (K-DUR,KLOR-CON) 20 MEQ tablet Take 1 tablet (20 mEq total) by mouth daily. 06/30/15   Darreld Mclean, MD  prednisoLONE acetate (PRED FORTE) 1 % ophthalmic suspension Place 1 drop into the right eye 4 (four) times daily. Patient taking differently: Place 1 drop into both eyes 4 (four) times daily.  06/24/15   Barton Dubois, MD  spironolactone (ALDACTONE) 25  MG tablet Take 0.5 tablets (12.5 mg total) by mouth daily. 07/04/15   Asiyah Cletis Media, MD  torsemide (DEMADEX) 20 MG tablet Take 2 tablets (40 mg total) by mouth 2 (two) times daily. 06/30/15   Darreld Mclean, MD  valACYclovir (VALTREX) 1000 MG tablet Take 1 tablet (1,000 mg total) by mouth 3 (three) times daily. Patient not taking: Reported on 07/16/2015 06/24/15   Barton Dubois, MD  VITAMIN E PO Take 1 capsule by mouth daily.    Historical Provider, MD   BP 111/70 mmHg  Pulse 65  Temp(Src) 97.4 F (36.3 C) (Oral)  Resp 12  Ht '5\' 9"'  (1.753 m)  Wt 228 lb (103.42 kg)  BMI 33.65 kg/m2  SpO2 98%   Physical Exam  Constitutional: He appears well-developed and well-nourished.  HENT:  Head: Normocephalic and atraumatic.  Nose: Nose normal.  Mouth/Throat: Oropharynx is clear and moist.  Eyes: Conjunctivae are normal. Right eye exhibits no discharge. Left eye exhibits no discharge.  Neck: Normal range of motion. Neck supple.  Cardiovascular: Normal rate, regular rhythm and normal heart sounds.   No murmur heard. Pulmonary/Chest: Effort normal and breath sounds normal. No respiratory distress. He has no wheezes. He has no rales.  Abdominal: Soft. There is no tenderness. There is no rebound and no guarding.  Musculoskeletal: He exhibits no edema.  Neurological: He is alert.  Skin: Skin is warm and dry.  Psychiatric: He has a normal mood and affect.  Nursing note and vitals reviewed.   ED Course  Procedures (including critical care time) Labs Review Labs Reviewed  CBC WITH DIFFERENTIAL/PLATELET - Abnormal; Notable for the following:    Hemoglobin 12.3 (*)    RDW 18.6 (*)    All other components within normal limits  COMPREHENSIVE METABOLIC PANEL - Abnormal; Notable for the following:    Chloride 95 (*)    BUN 69 (*)    Creatinine, Ser 2.84 (*)    Total Protein 8.3 (*)    GFR  calc non Af Amer 22 (*)    GFR calc Af Amer 25 (*)    All other components within normal limits  BRAIN  NATRIURETIC PEPTIDE - Abnormal; Notable for the following:    B Natriuretic Peptide 523.3 (*)    All other components within normal limits  I-STAT TROPOININ, ED    Imaging Review Dg Chest 2 View  07/18/2015   CLINICAL DATA:  Shortness of breath for 2 weeks  EXAM: CHEST - 2 VIEW  COMPARISON:  07/16/2015  FINDINGS: The cardiac shadow remains enlarged. Patchy linear areas of increased density are again identified throughout both lungs which may be related to scarring or mild atelectasis. No focal confluent infiltrate is seen. No sizable effusion or pneumothorax is noted. No bony abnormality is seen.  IMPRESSION: Stable linear areas of density in the mid lungs bilaterally.   Electronically Signed   By: Inez Catalina M.D.   On: 07/18/2015 12:46   Dg Chest 2 View  07/16/2015   CLINICAL DATA:  Shortness of breath, hypertension  EXAM: CHEST  2 VIEW  COMPARISON:  07/03/2015  FINDINGS: Enlargement of cardiac silhouette.  Mediastinal contours and pulmonary vascularity normal.  Linear subsegmental atelectasis versus scarring in mid to lower lungs bilaterally slightly greater on RIGHT.  No acute infiltrate, pleural effusion or pneumothorax.  Osseous structures unremarkable.  IMPRESSION: Enlargement of cardiac silhouette.  Subsegmental atelectasis versus scarring in the mid to lower lungs unchanged.  No acute abnormalities.   Electronically Signed   By: Lavonia Dana M.D.   On: 07/16/2015 16:47   I have personally reviewed and evaluated these images and lab results as part of my medical decision-making.  ED ECG REPORT   Date: 07/18/2015  Rate: 58  Rhythm: atrial fibrillation  QRS Axis: left  Intervals: normal  ST/T Wave abnormalities: normal  Conduction Disutrbances:left anterior fascicular block  Narrative Interpretation: occasional ectopy  Old EKG Reviewed: unchanged  I have personally reviewed the EKG tracing and agree with the computerized printout as noted.  Patient seen and examined. Work-up  initiated.   Vital signs reviewed and are as follows: BP 125/60 mmHg  Pulse 59  Temp(Src) 97.4 F (36.3 C) (Oral)  Resp 4  Ht '5\' 9"'  (1.753 m)  Wt 228 lb (103.42 kg)  BMI 33.65 kg/m2  SpO2 100%  Discussed with Dr. Billy Fischer who will see. Patient ambulated well off oxygen. Albuterol and atrovent ordered.   2:49 PM Patient seen by Dr. Billy Fischer and results reviewed. Albuterol nebulizer did not change symptoms. Lab work, CXR, BNP, ambulation are all very reassuring. No indication for admission. Patient is comfortable with d/c to home with PCP/cardiology f/u.    BP 118/87 mmHg  Pulse 66  Temp(Src) 97.4 F (36.3 C) (Oral)  Resp 18  Ht '5\' 9"'  (1.753 m)  Wt 228 lb (103.42 kg)  BMI 33.65 kg/m2  SpO2 97%   MDM   Final diagnoses:  Shortness of breath   Patient with h/o CHF however he is not clinically overloaded today, bnp improved from 2 days ago, reassuring CXR. Patient does not have a very significant COPD history no wheezing on exam, not improved with albuterol. Patient has ambulated without desaturation off of oxygen. Patient has chronic kidney disease at baseline. Patient is anticoagulated on Eliquis for previous DVT. Patient's symptoms today are not suggestive of PE and patient is anticoagulated.     Carlisle Cater, PA-C 07/18/15 [address] PA-C 07/18/15 1504  Gareth Morgan, MD 07/21/15 1020

## 2015-07-18 NOTE — ED Notes (Signed)
Patient transported to X-ray 

## 2015-07-21 ENCOUNTER — Other Ambulatory Visit: Payer: Self-pay | Admitting: *Deleted

## 2015-07-21 NOTE — Patient Outreach (Addendum)
[facility]) Care Management  07/21/2015  Frank Davidson 07/02/1951 [id number]  Fourth Transition of care contact  RN attempted to contact pt however only able to leave a HIPAA approved generic voice message requesting a call back. Will intervene further on needed services via [facility] if pt returns call.   [name], RN Care Management Coordinator [facility] Main Office [phone]

## 2015-07-23 ENCOUNTER — Other Ambulatory Visit: Payer: Self-pay | Admitting: *Deleted

## 2015-07-23 ENCOUNTER — Other Ambulatory Visit: Payer: Self-pay | Admitting: Family Medicine

## 2015-07-23 DIAGNOSIS — M25559 Pain in unspecified hip: Secondary | ICD-10-CM | POA: Insufficient documentation

## 2015-07-23 DIAGNOSIS — I509 Heart failure, unspecified: Secondary | ICD-10-CM

## 2015-07-23 MED ORDER — OXYCODONE-ACETAMINOPHEN 10-325 MG PO TABS: 1.0000 | ORAL_TABLET | Freq: Four times a day (QID) | ORAL | Status: DC

## 2015-07-23 NOTE — Telephone Encounter (Signed)
Pt would like a refill on his oxyCODONE-acetaminophen (PERCOCET) 10-325 MG per tablet [[id number]]. Please advise at [phone]

## 2015-07-23 NOTE — Telephone Encounter (Signed)
Reviewed controlled substance database and he has been [facility] at [facility] by an Frank Davidson- however this practice is no longer accepted by his [facility] so he has to change. He receives #120 percocet 10 monthly He thinks he will be set up with a new pain clinic within a month Will refill for him for the short term

## 2015-07-23 NOTE — Patient Outreach (Addendum)
[facility]) Care Management  07/23/2015  Frank Davidson Dec 17, 1950 [id number]   RN spoke with pt today concerning recent events with two previous ED visits related to SOB (history of HF). Pt states he is feeling "much better" with ongoing Metolazone and Torsemide medications with a weight today 222 lbs and yesterday 224 lbs. States he initially stated with 230 lbs but this has again improved. Confirms his next HF clinic appointment with Dr. Gala Romney on 9/2. Further education discussed on how sodium intake affects ongoing fluid retention and encouraged pt to on a salt smart dietary measures with his ongoing food consumption (pt vebralized an understanding). RN offered once again community home visit for further involvement concerning assistance in managing his HF however pt opt to decline however receptive to a health coach for ongoing telephonic follow ups. Pt also inquired about some information provided to him by one of [facility] social workers earlier in this year concerning a resource for building a ramp to his resident for entering his home however this information has been misplaced and pt has requested this information once again. RN offered referrals for both areas via health coach and social worker (pt agreed) .  No additional inquires or request at this time as this RN will no long follow this pt for services.   [name], RN Care Management Coordinator [facility] Main Office [phone]

## 2015-07-24 NOTE — Patient Outreach (Addendum)
[facility]) Care Management  07/24/2015  Frank Davidson 1951-02-15 [id number]   Request from [name], RN to assign RN Health Coach and SW, assigned [name], RN and [name], [place].  Thanks, [name]. [name] [facility] Care Management [facility] CM Assistant Phone: [phone] Fax: [phone]

## 2015-07-25 ENCOUNTER — Encounter: Payer: Self-pay | Admitting: *Deleted

## 2015-07-25 ENCOUNTER — Other Ambulatory Visit: Payer: Self-pay | Admitting: *Deleted

## 2015-07-25 NOTE — Patient Outreach (Signed)
[facility]) Care Management  [facility] Social Work  07/25/2015  Frank Davidson 1951-05-29 [id number]  Subjective:    "I need resources on home wheelchair installations".  Objective:   CSW agreed to provide patient with a list of community agencies and resources that provide wheelchair installations to residential properties.  Current Medications:  Current Outpatient Prescriptions  Medication Sig Dispense Refill  . ACCU-CHEK FASTCLIX LANCETS MISC Test blood sugar once daily. Dx code: E11.8 100 each 3  . acetaminophen (TYLENOL) 500 MG tablet Take 500 mg by mouth every 6 (six) hours as needed for moderate pain or headache.    . albuterol (PROVENTIL HFA;VENTOLIN HFA) 108 (90 BASE) MCG/ACT inhaler Inhale 2 puffs into the lungs every 6 (six) hours as needed for wheezing or shortness of breath. 1 Inhaler 1  . Alcohol Swabs (B-D SINGLE USE SWABS REGULAR) PADS Test blood sugar daily. Dx code: E11.8 100 each 3  . allopurinol (ZYLOPRIM) 100 MG tablet Take 100 mg by mouth daily.    . ALPHA LIPOIC ACID PO Take 1 capsule by mouth daily.    Marland Kitchen apixaban (ELIQUIS) 5 MG TABS tablet Take 1 tablet (5 mg total) by mouth 2 (two) times daily. 180 tablet 3  . Ascorbic Acid (VITAMIN C PO) Take 1 tablet by mouth daily.    Marland Kitchen b complex vitamins tablet Take 1 tablet by mouth daily.    . baclofen (LIORESAL) 10 MG tablet TAKE 1 TABLET EVERY 8 HOURS AS NEEDED FOR MUSCLE SPASM(S) 90 tablet 1  . Blood Glucose Calibration (ACCU-CHEK SMARTVIEW CONTROL) LIQD Test blood sugar once daily. Dx code: E11.8. 1 each 3  . Blood Glucose Monitoring Suppl (ACCU-CHEK NANO SMARTVIEW) W/DEVICE KIT Test blood sugar daily. Dx code: E11.8 1 kit 0  . carvedilol (COREG) 25 MG tablet TAKE 1/2 TABLET TWICE DAILY  WITH  A  MEAL 90 tablet 2  . Celery Seed OIL Take 1 capsule by mouth daily.    . Coenzyme Q10 (COQ10) 100 MG CAPS Take 100 mg by mouth 2 (two) times daily.    . colchicine 0.6 MG tablet Take 1.2 mg by mouth daily as  needed (Gout). Colcrys    . diclofenac (FLECTOR) 1.3 % PTCH Place 1 patch onto the skin 2 (two) times daily as needed (pain).    Marland Kitchen docusate sodium 100 MG CAPS Take 100 mg by mouth 2 (two) times daily. 10 capsule 0  . glipiZIDE (GLUCOTROL) 5 MG tablet Take 1 tablet (5 mg total) by mouth 2 (two) times daily. 180 tablet 3  . glucose blood (ACCU-CHEK SMARTVIEW) test strip Test blood sugar once daily. Dx code: E11.8 100 each 3  . HAWTHORN EXTRACT PO Take 1 capsule by mouth daily.    . hydrALAZINE (APRESOLINE) 50 MG tablet Take 1 tablet (50 mg total) by mouth 3 (three) times daily. 90 tablet 3  . isosorbide mononitrate (IMDUR) 60 MG 24 hr tablet TAKE 1 TABLET (60 MG TOTAL) BY MOUTH DAILY. 90 tablet 2  . metolazone (ZAROXOLYN) 5 MG tablet Take 5 mg by mouth daily as needed (for fluid).     Marland Kitchen omega-3 acid ethyl esters (LOVAZA) 1 G capsule Take 1 g by mouth daily.    Marland Kitchen oxyCODONE-acetaminophen (PERCOCET) 10-325 MG per tablet Take 1 tablet by mouth 4 (four) times daily. scheduled 120 tablet 0  . polyethylene glycol powder (GLYCOLAX/MIRALAX) powder MIX 1 CAPFUL (17 GM) IN LIQUID AND DRINK EVERY DAY AS DIRECTED 1700 g 2  . potassium chloride  SA (K-DUR,KLOR-CON) 20 MEQ tablet Take 1 tablet (20 mEq total) by mouth daily. 30 tablet 0  . prednisoLONE acetate (PRED FORTE) 1 % ophthalmic suspension Place 1 drop into the right eye 4 (four) times daily. (Patient taking differently: Place 1 drop into both eyes 4 (four) times daily. ) 5 mL 0  . spironolactone (ALDACTONE) 25 MG tablet Take 0.5 tablets (12.5 mg total) by mouth daily. 90 tablet 3  . torsemide (DEMADEX) 20 MG tablet Take 2 tablets (40 mg total) by mouth 2 (two) times daily. 60 tablet 0  . valACYclovir (VALTREX) 1000 MG tablet Take 1 tablet (1,000 mg total) by mouth 3 (three) times daily. (Patient not taking: Reported on 07/16/2015) 42 tablet 0  . VITAMIN E PO Take 1 capsule by mouth daily.     No current facility-administered medications for this visit.     Functional Status:  In your present state of health, do you have any difficulty performing the following activities: 07/23/2015 07/02/2015  Hearing? N N  Vision? N N  Difficulty concentrating or making decisions? N N  Walking or climbing stairs? N N  Dressing or bathing? N N  Doing errands, shopping? [name]  Preparing Food and eating ? N -  Using the Toilet? N -  In the past six months, have you accidently leaked urine? N -  Do you have problems with loss of bowel control? N -  Managing your Medications? N -  Managing your Finances? N -  Housekeeping or managing your Housekeeping? N -    Fall/Depression Screening:  PHQ 2/9 Scores 07/23/2015 07/16/2015 06/30/2015 06/18/2015 06/14/2015 05/05/2015 02/27/2015  PHQ - 2 Score 0 0 0 0 0 0 0    Assessment:   CSW was able to make initial contact with patient today to perform phone assessment, as well as assess and assist with social work needs and services.  CSW introduced self, explained role and types of services provided through [facility] ([facility]).  CSW further explained to patient that CSW works with patient's RNCM, also with [facility], Raina Mina. CSW then explained the reason for the call, indicating that Ms. [name] thought that patient would benefit from social work services and resources to assist with wheelchair installation for home use.  CSW obtained two HIPAA compliant identifiers from patient, which included patient's name and date of birth. Patient admitted that he is fearful of trying to get and out of his home safely, when assistance is not available to help him.  Patient reported that he received a list of resources earlier in the year, but is now unable to place them.  CSW agreed to mail patient a list of community agencies and resources that assist with installation of residential wheelchair ramps.  Patient indicated that he is able to call around to obtain price quotes, etc., once  the resource information is received.  CSW inquired as to how CSW could be of further assistance to patient at this time.  Patient denied, indicating that he would be looking out for the packet of information that CSW agreed to mail directly to his home.   Plan:   CSW will mail patient a packet of information containing community agencies and resources offering installation of residential wheelchair ramps. CSW will contact patient in one week (Thursday, September 8th) to ensure that patient received the packet of resource information that CSW mailed to patient's home for his review. CSW will converse with patient's RNCM with  [facility], Raina Mina to report findings of initial phone conversation with patient today. CSW will fax a correspondence letter to patient's Primary Care Physician, Dr. Janett Billow Copland to ensure that Dr. Lorelei Pont is aware of CSW's involvement with patient.  Nat Christen, BSW, MSW, LCSW  Licensed Education officer, [facility]  Mailing [place] [address], [place], [place] [address] Physical Address-[address], [place], [place] [address] Toll Free Main # [phone] Fax # [phone] Cell # [phone]  Fax # [phone]  [email or web address]

## 2015-07-28 ENCOUNTER — Other Ambulatory Visit: Payer: Self-pay | Admitting: Family Medicine

## 2015-07-31 ENCOUNTER — Other Ambulatory Visit: Payer: Self-pay

## 2015-07-31 ENCOUNTER — Other Ambulatory Visit: Payer: Self-pay | Admitting: *Deleted

## 2015-07-31 NOTE — Patient Outreach (Signed)
Frank Davidson) Care Management  Frank Davidson  07/31/2015   Frank Davidson October 03, 1951 [id number]  Telephone call to patient for initial health coach outreach.  Patient reports he is doing fair.  Patient reports he having some pain in his left hip as he had a replacement in Oct 2015. Patient reports pain 5/10 today and he is in the middle of finding a new pain management as his old pain management doctor stopped taking [employer].  Patient reports he is waiting for a new referral from his doctor but will be checking with his doctor regarding status of the referral.   Diabetes:   Patient reports that his blood sugar today was 122 and last A1c was 5.9.  Patient reports no problems with his diabetes.   Heart Failure: Patient reports today's weight was 223 lbs.  Patient reports if he gain more than five pounds per week he is to notify his physician.  Patient familiar with heart failure zone chart.  Discussed with patient yellow zone chart. Patient reports he tries to maintain a low salt diet and eats mainly vegetables.  Stressed to patient importance of continuing heart failure monitoring in order to remain as health as possible.  He verbalized understanding.   Current Medications:  Current Outpatient Prescriptions  Medication Sig Dispense Refill  . ACCU-CHEK FASTCLIX LANCETS MISC Test blood sugar once daily. Dx code: E11.8 100 each 3  . acetaminophen (TYLENOL) 500 MG tablet Take 500 mg by mouth every 6 (six) hours as needed for moderate pain or headache.    . albuterol (PROVENTIL HFA;VENTOLIN HFA) 108 (90 BASE) MCG/ACT inhaler Inhale 2 puffs into the lungs every 6 (six) hours as needed for wheezing or shortness of breath. 1 Inhaler 1  . Alcohol Swabs (B-D SINGLE USE SWABS REGULAR) PADS Test blood sugar daily. Dx code: E11.8 100 each 3  . allopurinol (ZYLOPRIM) 100 MG tablet Take 100 mg by mouth daily.    . ALPHA LIPOIC ACID PO Take 1 capsule by mouth daily.    Marland Kitchen apixaban (ELIQUIS)  5 MG TABS tablet Take 1 tablet (5 mg total) by mouth 2 (Davidson) times daily. 180 tablet 3  . Ascorbic Acid (VITAMIN C PO) Take 1 tablet by mouth daily.    Marland Kitchen b complex vitamins tablet Take 1 tablet by mouth daily.    . baclofen (LIORESAL) 10 MG tablet TAKE 1 TABLET EVERY 8 HOURS AS NEEDED FOR MUSCLE SPASM(S) 90 tablet 1  . Blood Glucose Calibration (ACCU-CHEK SMARTVIEW CONTROL) LIQD Test blood sugar once daily. Dx code: E11.8. 1 each 3  . Blood Glucose Monitoring Suppl (ACCU-CHEK NANO SMARTVIEW) W/DEVICE KIT Test blood sugar daily. Dx code: E11.8 1 kit 0  . carvedilol (COREG) 25 MG tablet TAKE 1/2 TABLET TWICE DAILY  WITH  A  MEAL 90 tablet 2  . Celery Seed OIL Take 1 capsule by mouth daily.    . Coenzyme Q10 (COQ10) 100 MG CAPS Take 100 mg by mouth 2 (Davidson) times daily.    . colchicine 0.6 MG tablet Take 1.2 mg by mouth daily as needed (Gout). Colcrys    . diclofenac (FLECTOR) 1.3 % PTCH Place 1 patch onto the skin 2 (Davidson) times daily as needed (pain).    Marland Kitchen docusate sodium 100 MG CAPS Take 100 mg by mouth 2 (Davidson) times daily. 10 capsule 0  . glipiZIDE (GLUCOTROL) 5 MG tablet Take 1 tablet (5 mg total) by mouth 2 (Davidson) times daily. 180 tablet 3  .  glucose blood (ACCU-CHEK SMARTVIEW) test strip Test blood sugar once daily. Dx code: E11.8 100 each 3  . HAWTHORN EXTRACT PO Take 1 capsule by mouth daily.    . hydrALAZINE (APRESOLINE) 50 MG tablet Take 1 tablet (50 mg total) by mouth 3 (three) times daily. 90 tablet 3  . isosorbide mononitrate (IMDUR) 60 MG 24 hr tablet TAKE 1 TABLET (60 MG TOTAL) BY MOUTH DAILY. 90 tablet 2  . metolazone (ZAROXOLYN) 5 MG tablet Take 5 mg by mouth daily as needed (for fluid).     Marland Kitchen omega-3 acid ethyl esters (LOVAZA) 1 G capsule Take 1 g by mouth daily.    Marland Kitchen oxyCODONE-acetaminophen (PERCOCET) 10-325 MG per tablet Take 1 tablet by mouth 4 (four) times daily. scheduled 120 tablet 0  . polyethylene glycol powder (GLYCOLAX/MIRALAX) powder MIX 1 CAPFUL (17GM)  IN  LIQUID  AND   DRINK EVERY DAY AS DIRECTED 1530 g 3  . potassium chloride SA (K-DUR,KLOR-CON) 20 MEQ tablet Take 1 tablet (20 mEq total) by mouth daily. 30 tablet 0  . prednisoLONE acetate (PRED FORTE) 1 % ophthalmic suspension Place 1 drop into the right eye 4 (four) times daily. (Patient taking differently: Place 1 drop into both eyes 4 (four) times daily. ) 5 mL 0  . spironolactone (ALDACTONE) 25 MG tablet Take 0.5 tablets (12.5 mg total) by mouth daily. 90 tablet 3  . torsemide (DEMADEX) 20 MG tablet Take 2 tablets (40 mg total) by mouth 2 (Davidson) times daily. 60 tablet 0  . VITAMIN E PO Take 1 capsule by mouth daily.    . valACYclovir (VALTREX) 1000 MG tablet Take 1 tablet (1,000 mg total) by mouth 3 (three) times daily. (Patient not taking: Reported on 07/16/2015) 42 tablet 0   No current facility-administered medications for this visit.    Functional Status:  In your present state of health, do you have any difficulty performing the following activities: 07/31/2015 07/25/2015  Hearing? N N  Vision? N N  Difficulty concentrating or making decisions? N N  Walking or climbing stairs? N N  Dressing or bathing? N N  Doing errands, shopping? Frank Davidson  Preparing Food and eating ? N N  Using the Toilet? N N  In the past six months, have you accidently leaked urine? N N  Do you have problems with loss of bowel control? N N  Managing your Medications? N N  Managing your Finances? N N  Housekeeping or managing your Housekeeping? Frank Davidson    Fall/Depression Screening: PHQ 2/9 Scores 07/31/2015 07/25/2015 07/23/2015 07/16/2015 06/30/2015 06/18/2015 06/14/2015  PHQ - 2 Score 0 0 0 0 0 0 0    Assessment: Patient will benefit from ongoing outreach from health coach concerning heart failure management.    Plan:  Frank Davidson        Most Recent Value   Care Plan Problem Davidson  Monitoring heart failure   Role Documenting the Problem Davidson  Frank Davidson  Active   Interventions for Problem  Davidson Long Term Goal   Discussed with patient heart failure zone chart.   Frank Long Term Goal (31-90) days  Patient will be able to follow and understand heart failure zone chart within 90 days   Frank Long Term Goal Start Date  07/31/15   Frank CM Short Term Goal #1 (0-30 days)  Patient will continue daily weights and diet related to heart failure within 30 days  Frank CM Short Term Goal #1 Start Date  07/31/15   Interventions for Short Term Goal #2   Discussed with patient importance of weights and diet related to heart failure.   Frank CM Short Term Goal #2 (0-30 days)  Patient will be able to discuss yellow zone of heart failure chart within 30 days.     Frank CM Short Term Goal #2 Start Date  07/31/15   Interventions for Short Term Goal #2  Discussed with patient yellow zone of heart failure chart with patient.      RN Health Coach will provide ongoing education for patient on heart failure through phone calls and sending printed information to patient for further discussion. RN Health Coach will send welcome packet with consent to patient as well as printed information on health failure. RN Health Coach will send initial barriers letter, assessment, and care plan to primary care physician.  RN Health Coach will contact patient within one month and patient agrees to next contact.

## 2015-07-31 NOTE — Patient Outreach (Signed)
[facility] Care Management  07/31/2015  Frank Davidson [birth year] [id number]   CSW was able to make contact with patient today to follow-up regarding patient's recent request to receive resources that offer assistance with wheelchair ramp installations.  Patient admitted to receiving the packet of information that CSW mailed to patient's home on September 2nd.  Patient indicated that he has also had an opportunity to review this information and is planning to make contact with at least four of the agencies provided to him in the packet.  Patient stated that he can live with the condition of his wheelchair ramp, he really just needs to have his concrete and brickwork repaired.  The agencies provided to patient should be able to accommodate this request.  Patient denies being able to identify any additional social work needs at present.  CSW provided patient with CSW's contact information, encouraging patient to contact CSW directly if additional social work needs arise in the near future.  Patient voiced understanding and was agreeable to this plan. CSW will prescribe and print EMMI information for patient and mail to patient's home for his review. CSW will perform a case closure on patient, as all goals of treatment have been met from social work standpoint and no additional social work needs have been identified at this time. CSW will notify patient's Health Coach with [facility] [employer], Jon Billings of CSW's plans to close patient's case. CSW will fax a correspondence letter to patient's Primary Care Physician, Dr. Janett Billow Copland to ensure that Dr. Lorelei Pont is aware of CSW's involvement with patient.   Nat Christen, BSW, MSW, LCSW  Licensed Education officer, [facility]  Mailing [place] [address], [place], [place] [address] Physical Address-[address], [place], [place] [address] Toll Free  Main # [phone] Fax # [phone] Cell # [phone]  Fax # [phone]  [email or web address]

## 2015-08-03 ENCOUNTER — Other Ambulatory Visit: Payer: Self-pay | Admitting: Internal Medicine

## 2015-08-08 ENCOUNTER — Ambulatory Visit (HOSPITAL_COMMUNITY)
Admission: RE | Admit: 2015-08-08 | Discharge: 2015-08-08 | Disposition: A | Discharge status: Home / Self Care | Payer: Commercial Managed Care - HMO | Source: Ambulatory Visit | Attending: Cardiology | Admitting: Cardiology

## 2015-08-08 ENCOUNTER — Encounter (HOSPITAL_COMMUNITY): Payer: Self-pay

## 2015-08-08 ENCOUNTER — Telehealth (HOSPITAL_COMMUNITY): Payer: Self-pay | Admitting: Cardiology

## 2015-08-08 VITALS — BP 98/60 | HR 81 | Wt 226.5 lb

## 2015-08-08 DIAGNOSIS — G4730 Sleep apnea, unspecified: Secondary | ICD-10-CM | POA: Insufficient documentation

## 2015-08-08 DIAGNOSIS — N183 Chronic kidney disease, stage 3 (moderate): Secondary | ICD-10-CM | POA: Insufficient documentation

## 2015-08-08 DIAGNOSIS — M797 Fibromyalgia: Secondary | ICD-10-CM | POA: Diagnosis not present

## 2015-08-08 DIAGNOSIS — Z87891 Personal history of nicotine dependence: Secondary | ICD-10-CM | POA: Insufficient documentation

## 2015-08-08 DIAGNOSIS — Z801 Family history of malignant neoplasm of trachea, bronchus and lung: Secondary | ICD-10-CM | POA: Diagnosis not present

## 2015-08-08 DIAGNOSIS — I480 Paroxysmal atrial fibrillation: Secondary | ICD-10-CM | POA: Diagnosis not present

## 2015-08-08 DIAGNOSIS — M109 Gout, unspecified: Secondary | ICD-10-CM | POA: Insufficient documentation

## 2015-08-08 DIAGNOSIS — E785 Hyperlipidemia, unspecified: Secondary | ICD-10-CM | POA: Insufficient documentation

## 2015-08-08 DIAGNOSIS — J45909 Unspecified asthma, uncomplicated: Secondary | ICD-10-CM | POA: Diagnosis not present

## 2015-08-08 DIAGNOSIS — I129 Hypertensive chronic kidney disease with stage 1 through stage 4 chronic kidney disease, or unspecified chronic kidney disease: Secondary | ICD-10-CM | POA: Insufficient documentation

## 2015-08-08 DIAGNOSIS — E1122 Type 2 diabetes mellitus with diabetic chronic kidney disease: Secondary | ICD-10-CM | POA: Diagnosis not present

## 2015-08-08 DIAGNOSIS — J438 Other emphysema: Secondary | ICD-10-CM | POA: Diagnosis not present

## 2015-08-08 DIAGNOSIS — Z8673 Personal history of transient ischemic attack (TIA), and cerebral infarction without residual deficits: Secondary | ICD-10-CM | POA: Insufficient documentation

## 2015-08-08 DIAGNOSIS — I739 Peripheral vascular disease, unspecified: Secondary | ICD-10-CM | POA: Diagnosis not present

## 2015-08-08 DIAGNOSIS — I428 Other cardiomyopathies: Secondary | ICD-10-CM | POA: Diagnosis not present

## 2015-08-08 DIAGNOSIS — I5022 Chronic systolic (congestive) heart failure: Secondary | ICD-10-CM | POA: Diagnosis not present

## 2015-08-08 DIAGNOSIS — Z7902 Long term (current) use of antithrombotics/antiplatelets: Secondary | ICD-10-CM | POA: Insufficient documentation

## 2015-08-08 DIAGNOSIS — K219 Gastro-esophageal reflux disease without esophagitis: Secondary | ICD-10-CM | POA: Insufficient documentation

## 2015-08-08 DIAGNOSIS — Z79899 Other long term (current) drug therapy: Secondary | ICD-10-CM | POA: Insufficient documentation

## 2015-08-08 LAB — BASIC METABOLIC PANEL
Anion gap: 12 (ref 5–15)
BUN: 87 mg/dL — AB (ref 6–20)
CALCIUM: 9.9 mg/dL (ref 8.9–10.3)
CHLORIDE: 93 mmol/L — AB (ref 101–111)
CO2: 32 mmol/L (ref 22–32)
CREATININE: 2.77 mg/dL — AB (ref 0.61–1.24)
GFR calc Af Amer: 26 mL/min — ABNORMAL LOW (ref >60)
GFR, EST NON AFRICAN AMERICAN: 22 mL/min — AB (ref >60)
GLUCOSE: 84 mg/dL (ref 65–99)
POTASSIUM: 3.2 mmol/L — AB (ref 3.5–5.1)
Sodium: 137 mmol/L (ref 135–145)

## 2015-08-08 LAB — CBC
HCT: 39 % (ref 39.0–52.0)
HEMOGLOBIN: 12.5 g/dL — AB (ref 13.0–17.0)
MCH: 28.4 pg (ref 26.0–34.0)
MCHC: 32.1 g/dL (ref 30.0–36.0)
MCV: 88.6 fL (ref 78.0–100.0)
PLATELETS: 169 10*3/uL (ref 150–400)
RBC: 4.4 MIL/uL (ref 4.22–5.81)
RDW: 16.9 % — ABNORMAL HIGH (ref 11.5–15.5)
WBC: 10.1 10*3/uL (ref 4.0–10.5)

## 2015-08-08 LAB — BRAIN NATRIURETIC PEPTIDE: B NATRIURETIC PEPTIDE 5: 805.4 pg/mL — AB (ref 0.0–100.0)

## 2015-08-08 MED ORDER — POTASSIUM CHLORIDE CRYS ER 20 MEQ PO TBCR: 20.0000 meq | EXTENDED_RELEASE_TABLET | Freq: Every day | ORAL | Status: DC

## 2015-08-08 NOTE — Progress Notes (Signed)
SATURATION QUALIFICATIONS: (This note is used to comply with regulatory documentation for home oxygen)  Patient Saturations on Room Air at Rest = 94%  Patient Saturations on Room Air while Ambulating = 85%  Patient Saturations on 2 Liters of oxygen while Ambulating = 96%  Please briefly explain why patient needs home oxygen: heart failure, hypoxia with ambulation 

## 2015-08-08 NOTE — Patient Instructions (Signed)
Labs today  Your physician has recommended that you have a pulmonary function test. Pulmonary Function Tests are a group of tests that measure how well air moves in and out of your lungs.  You have been referred to [facility] for home oxygen, they will contact you before coming out  Your physician recommends that you schedule a follow-up appointment in: 1 month

## 2015-08-08 NOTE — Telephone Encounter (Signed)
Pt aware and voiced understanding 

## 2015-08-08 NOTE — Progress Notes (Signed)
Advanced Heart Failure Medication Review by a Pharmacist  Does the patient  feel that his/her medications are working for him/her?  yes  Has the patient been experiencing any side effects to the medications prescribed?  no  Does the patient measure his/her own blood pressure or blood glucose at home?  yes   Does the patient have any problems obtaining medications due to transportation or finances?   no  Understanding of regimen: good Understanding of indications: good Potential of compliance: good    Pharmacist comments:  Frank Davidson is a pleasant 64 yo M presenting without a medication list but able to verbalize most of his medications and dosages to me. He reports excellent compliance with all of his medications. The only discrepancy in his regimen was that he is only taking potassium as needed whenever he takes metolazone which he has not used lately. He did not have any specific medication-related questions or concerns for me at this time.   Tyler Deis. Bonnye Fava, PharmD, BCPS, CPP Clinical Pharmacist Pager: [phone] Phone: [phone] 08/08/2015 10:13 AM

## 2015-08-08 NOTE — Telephone Encounter (Signed)
-----   Message from Laurey Morale, MD sent at 08/08/2015 12:29 PM EDT ----- Stable creatinine.  Add KCl 20 daily.

## 2015-08-09 NOTE — Progress Notes (Signed)
Patient ID: [id number] BADOLATO, male   DOB: 1951-04-23, 64 y.o.   MRN: [id number] PCP: Roxy Cedar FNP GI: Dr Reed Breech Nephrologist: Dr. Mercy Moore  HPI: Mr. Swiatek is a 64 y.o. male with a hx of obesity, PAF, HTN, COPD, HTN, gout, DM 2, CVA and CHF EF 25% due to NICM. Intolerant Entresto at the lowest dose due to syncope.   Has previously followed in the [facility] in [place]. Previous cath in 2007 showed NICM with normal cors. Was living alone in [place] and apparently had issues managing medications by himself and was getting confused with PCP and cardiologists medications and was not taking his meds correctly. He moved to [place] to be near his nieces. Issues in the past with ACE-I due to renal failure. Highest creatinine in our system is ~1.5 but was apparently higher previously - thought to be related to taking his medications incorrectly.   Was admitted in 06/2013 from [facility] with left sided weakness and was found to have a moderate to large right hemispheric infarct. Went back to [facility].   Admitted to [facility] 10/14 through 09/09/13 with ADHF. Diuresed 30 pounds with IV lasix and transitioned to lasix 80 mg po bid. Continued on 25 mg carvedilol bid, isordil 20 mg daily.  Started on lisinopril 2.5 mg daily. Discharge weight 229 pounds. 09/29/13 CT abdomen and pelvis - no obstruction . Diverticulosis. No inflammatory changes.   Admitted 11/23/13 through 11/26/13 with volume overload and dyspnea. Had worsening cardiorenal syndrome. Nephrology consulted and managed with IV lasix and transitioned to torsemide 40 mg twice a day.  Diuresed 25 pounds. Discharge weight 232 pounds. Hydralazine, Spiro, and Lisinopril stopped at discharge. He is also off digoxin.    Admitted 07/01/15 for AKI after presumed over diuresis. His Cr on admit was 4.6., though his baseline is 2.  We followed him in the hospital and held diuresis for several days.  Cr on discharge was 2.36. We decreased his spiro to 12.5  mg, and his diuretic regimen was continued at 40 mg torsemide BID.  Discharge weight was 229.  He returns for followup today.  Says he can walk 1 mile if he uses cane or walker.  Seems to be doing some better in terms of breathing.  He is short of breath doing chores around his house.  Occasional lightheadedness but no syncope or falls.  Weight is down 9 lbs.    We checked oxygen saturation today => >90% at rest but 85% with exertion.    ECHO 11/08/13 EF 15%  ECHO 07/05/14: EF 15%  ECHO 11/26/2014: EF 20%   08/13/14 RA = 7  RV = 50/3/10  PA = 54/22 (34)  PCW = 16  Fick cardiac output/index = 6.9/3.0  Thermo CO/CI = 4.7/2.1  PVR = 4.1 WU  Ao sat = 94%  PA sat = 68%,68%  SVC sat (sheath) = 62%  Labs 08/22/14 K 4.4 creatinine 1.6  09/02/14 Creatinine 1.6 10/20/14: creatinine 2.58, K 5.1 11/23/2014: Creatinine 4.12 --Ace and Spiro stopped 11/24/2014: Creatinine 3.55 1/4/12016: 2.49  11/26/2014: Hgb 10.4 Hct 31.7 Creatinine 1.94  12/03/14: K 4, creatinine 2.25 02/19/2015: K 3.8 Creatinine 2.66  05/05/2015: K 3.4 Creatinine 2.68   8/16: K 3.5 Creatinine 2.36 => 2.84, hgb 10.4, BNP 523  SH: Retired Education officer, [employer] 2011 Lives with his niece  FH: Father DM  Mom had lung cancer  ROS: All systems negative except as listed in HPI, PMH and Problem List.  Past Medical History  Diagnosis Date  . Atrial fibrillation     PAF  . Hypertension   . COPD (chronic obstructive pulmonary disease)   . Ocular herpes   . Gout   . CHF (congestive heart failure)   . Chronic pain   . Hyperlipidemia   . Nonischemic cardiomyopathy Aug 2015    EF 15%  . Ocular herpes   . Neuropathy   . Left-sided weakness     "because of the arthritis and edema"  . PVD (peripheral vascular disease) May 2015    abnormal ABIs  . Memory impairment     "short term"  . CKD (chronic kidney disease), stage III     Dr. Mercy Moore follows-holding   . Fibromyalgia     neuropathy- hands, more than feet.  . Sleep apnea with use  of continuous positive airway pressure (CPAP)     does not use cpap  . GERD (gastroesophageal reflux disease)     tums as needed  . Asthma   . Type II diabetes mellitus   . Stroke X 5    last stroke aug 2014"; denies residual on 09/12/2014  . Osteoarthritis   . Arthritis     "knees, right elbow, shoulder" (09/12/2014)  . Shortness of breath dyspnea     Current Outpatient Prescriptions  Medication Sig Dispense Refill  . ACCU-CHEK FASTCLIX LANCETS MISC Test blood sugar once daily. Dx code: E11.8 100 each 3  . acetaminophen (TYLENOL) 500 MG tablet Take 500 mg by mouth every 6 (six) hours as needed for moderate pain or headache.    . albuterol (PROVENTIL HFA;VENTOLIN HFA) 108 (90 BASE) MCG/ACT inhaler Inhale 2 puffs into the lungs every 6 (six) hours as needed for wheezing or shortness of breath. 1 Inhaler 1  . Alcohol Swabs (B-D SINGLE USE SWABS REGULAR) PADS Test blood sugar daily. Dx code: E11.8 100 each 3  . allopurinol (ZYLOPRIM) 100 MG tablet Take 100 mg by mouth daily.    . ALPHA LIPOIC ACID PO Take 1 capsule by mouth daily.    Marland Kitchen apixaban (ELIQUIS) 5 MG TABS tablet Take 1 tablet (5 mg total) by mouth 2 (two) times daily. 180 tablet 3  . Ascorbic Acid (VITAMIN C PO) Take 1 tablet by mouth daily.    Marland Kitchen b complex vitamins tablet Take 1 tablet by mouth daily.    . baclofen (LIORESAL) 10 MG tablet TAKE 1 TABLET EVERY 8 HOURS AS NEEDED FOR MUSCLE SPASM(S) 90 tablet 1  . Blood Glucose Calibration (ACCU-CHEK SMARTVIEW CONTROL) LIQD Test blood sugar once daily. Dx code: E11.8. 1 each 3  . Blood Glucose Monitoring Suppl (ACCU-CHEK NANO SMARTVIEW) W/DEVICE KIT Test blood sugar daily. Dx code: E11.8 1 kit 0  . carvedilol (COREG) 25 MG tablet TAKE 1/2 TABLET TWICE DAILY  WITH  A  MEAL 90 tablet 2  . Celery Seed OIL Take 1 capsule by mouth daily.    . Coenzyme Q10 (COQ10) 100 MG CAPS Take 100 mg by mouth 2 (two) times daily.    . diclofenac (FLECTOR) 1.3 % PTCH Place 1 patch onto the skin 2 (two)  times daily as needed (pain).    Marland Kitchen docusate sodium 100 MG CAPS Take 100 mg by mouth 2 (two) times daily. 10 capsule 0  . glipiZIDE (GLUCOTROL) 5 MG tablet Take 1 tablet (5 mg total) by mouth 2 (two) times daily. 180 tablet 3  . glucose blood (ACCU-CHEK SMARTVIEW) test strip Test blood sugar once daily. Dx code: E11.8 100 each  3  . HAWTHORN EXTRACT PO Take 1 capsule by mouth daily.    . hydrALAZINE (APRESOLINE) 50 MG tablet Take 1 tablet (50 mg total) by mouth 3 (three) times daily. 90 tablet 3  . isosorbide mononitrate (IMDUR) 60 MG 24 hr tablet TAKE 1 TABLET (60 MG TOTAL) BY MOUTH DAILY. 90 tablet 2  . omega-3 acid ethyl esters (LOVAZA) 1 G capsule Take 1 g by mouth daily.    Marland Kitchen oxyCODONE-acetaminophen (PERCOCET) 10-325 MG per tablet Take 1 tablet by mouth 4 (four) times daily. scheduled 120 tablet 0  . polyethylene glycol powder (GLYCOLAX/MIRALAX) powder MIX 1 CAPFUL (17GM)  IN  LIQUID  AND  DRINK EVERY DAY AS DIRECTED 1530 g 3  . prednisoLONE acetate (PRED FORTE) 1 % ophthalmic suspension Place 1 drop into both eyes 4 (four) times daily.    Marland Kitchen spironolactone (ALDACTONE) 25 MG tablet Take 0.5 tablets (12.5 mg total) by mouth daily. 90 tablet 3  . torsemide (DEMADEX) 20 MG tablet Take 2 tablets (40 mg total) by mouth 2 (two) times daily. 60 tablet 0  . VITAMIN E PO Take 1 capsule by mouth daily.    . colchicine 0.6 MG tablet Take 1.2 mg by mouth daily as needed (Gout). Colcrys    . metolazone (ZAROXOLYN) 5 MG tablet TAKE 1 TABLET EVERY DAY AS NEEDED FOR SWELLING (Patient not taking: Reported on 08/08/2015) 30 tablet 2  . potassium chloride SA (K-DUR,KLOR-CON) 20 MEQ tablet Take 1 tablet (20 mEq total) by mouth daily. 30 tablet 6   No current facility-administered medications for this encounter.    Filed Vitals:   08/08/15 0957  BP: 98/60  Pulse: 81  Weight: 226 lb 8 oz (102.74 kg)  SpO2: 90%   PHYSICAL EXAM: General:  Obese male; Ambulated into clinic without difficulty.  HEENT:  normal Neck: supple. JVP difficult to assess d/t body habitus - does not appear elevated.  Carotids 2+ bilaterally; no bruits. No lymphadenopathy or thryomegaly noted. Cor: PMI nonpalpable. Regular rate & rhythm. No rubs, gallops or murmurs appreciated Lungs: Distant breath sounds but clear lungs. Abdomen: obese. soft, nontender, + distended. No HSM. No bruits or masses. Good bowel sounds. Extremities: no cyanosis, clubbing, rash, trace-1+bilateral ankle edema. Compression stockings in place.   Neuro: alert & orientedx3, cranial nerves grossly intact. Affect pleasant.  ASSESSMENT & PLAN:  1) Chronic Systolic Heart Failure: Nonischemic cardiomyopathy, EF 20% 1/16. NYHA III symptoms.  He does not appear volume overloaded on exam.  No BP room today to titrate meds. - Continue torsemide 40 mg BID.  - Continue 12.5 mg spiro.  - Continue coreg 12.5 mg BID. Has not tolerated up titration in the past due to fatigue. - Continue Imdur + hydralazine 50 mg tid.    - Off digoxin due to CKD.  - Check BMET and BNP now.  - Syncope in past with Entresto.  Corlanor also stopped during recent admit.  - He remains firm in his decision to not pursue ICD.  - Reinforced the need and importance of daily weights, a low sodium diet and to avoid soups, and fluid restriction (less than 2 L a day). Instructed to call the HF clinic if weight increases more than 3 lbs overnight or 5 lbs in a week.  2) Gout: Continue allopurinol. Continue to follow with rheumatology.  3) Atrial fibrillation: Paroxysmal.  NSR today. Continue Eliquis. CBC today.  4) CKD stage III: Baseline 1.9-2.4 but up to 2.8 recently. Follows with nephrology.  BMET  today.  5) Exertional hypoxemia:  He does not appear volume overloaded.  He is a prior smoker, I wonder if he has COPD.  I will arrange for PFTs.  He qualifies for home oxygen with exertion, which we will order.   Followup in 1 month.   Loralie Champagne 08/09/2015

## 2015-08-10 ENCOUNTER — Other Ambulatory Visit: Payer: Self-pay | Admitting: Family Medicine

## 2015-08-11 NOTE — Telephone Encounter (Signed)
Actually, gave #60 which was just a 2 wk supply. Did you want pt to continue on this dose? He has been in and out of the hosp since then. Do you need to see pt back , or have cardiologist manage this med?

## 2015-08-11 NOTE — Telephone Encounter (Signed)
Dr Patsy Lager, at last OV in Aug, you increased pt's dosage to 80 mg total daily, but only gave one 30 day Rx

## 2015-08-12 ENCOUNTER — Telehealth: Payer: Self-pay

## 2015-08-12 ENCOUNTER — Ambulatory Visit (INDEPENDENT_AMBULATORY_CARE_PROVIDER_SITE_OTHER): Payer: Commercial Managed Care - HMO | Admitting: Physician Assistant

## 2015-08-12 ENCOUNTER — Telehealth (HOSPITAL_COMMUNITY): Payer: Self-pay | Admitting: *Deleted

## 2015-08-12 VITALS — BP 100/68 | HR 74 | Temp 97.8°F | Resp 18 | Ht 67.0 in | Wt 223.8 lb

## 2015-08-12 DIAGNOSIS — R300 Dysuria: Secondary | ICD-10-CM | POA: Diagnosis not present

## 2015-08-12 DIAGNOSIS — R32 Unspecified urinary incontinence: Secondary | ICD-10-CM | POA: Diagnosis not present

## 2015-08-12 LAB — POC MICROSCOPIC URINALYSIS (UMFC): MUCUS RE: ABSENT

## 2015-08-12 LAB — POCT URINALYSIS DIP (MANUAL ENTRY)
Bilirubin, UA: NEGATIVE
GLUCOSE UA: NEGATIVE
Ketones, POC UA: NEGATIVE
NITRITE UA: NEGATIVE
PROTEIN UA: NEGATIVE
SPEC GRAV UA: 1.01
UROBILINOGEN UA: 0.2
pH, UA: 7

## 2015-08-12 MED ORDER — SULFAMETHOXAZOLE-TRIMETHOPRIM 800-160 MG PO TABS: 1.0000 | ORAL_TABLET | Freq: Two times a day (BID) | ORAL | Status: AC

## 2015-08-12 NOTE — Telephone Encounter (Signed)
-----   Message from Henderson Newcomer sent at 08/11/2015  1:28 PM EDT ----- Frank Davidson.  This pt has declined O2 set up at this time.  He advised that he has an appt with you all on Wednesday and would like to wait until after that visit to move forward with O2. We will put this order on hold for now. Thanks  ----- Message -----    From: Noralee Space, RN    Sent: 08/08/2015  12:41 PM      To: Melissa Stenson  Hey pt needs home oxygen, I placed order, thanks

## 2015-08-12 NOTE — Progress Notes (Signed)
Subjective:     Patient ID: Frank Davidson, male   DOB: 1950-12-28, 64 y.o.   MRN: [id number] PCP: [name], MD  Chief Complaint  Patient presents with  . Urinary Frequency    burning and pain  x  1 week  . Referral    For pain management dr.    HPI Patient is a 64 yo M who presents today for evaluation of urinary frequency x 1 week. He has occasional dysuria. He noticed that his urine was a little brown/reddish. He has had to wear Depends because he cannot hold his urine. No abdominal pain. No fever. Complains of low back pain. No myalgias. He is not sexually active. No penile discharge or pain.   He is asking about his referral for pain management. He states that the office here has been working on this referral for a month. He is s/p total hip replacement in November of 2015. He states he needs his right hip replaced as well. He has chronic pain in his shoulders, pain in his right elbow from gout, and chronic pain in his knees from osteoarthritis.  He also states that he had shingles one month ago on his face, and he continues to have to use eye drops for red eye dryness. No residual pain from where the shingles rash was.  Review of Systems  Constitutional: Positive for chills ("I'm cold all the time."). Negative for fever.  Gastrointestinal: Positive for constipation ("With 4 percocets a day I'm clogged. I take a laxative, Miralax and eat a lot of vegetables and fruits."). Negative for nausea, vomiting, abdominal pain and diarrhea.  Genitourinary: Positive for dysuria and hematuria. Negative for flank pain.  Musculoskeletal: Positive for back pain. Negative for myalgias.  See HPI    Patient Active Problem List   Diagnosis Date Noted  . Hip pain 07/23/2015  . Acute exacerbation of CHF (congestive heart failure)   . Dyspnea   . Acute renal failure superimposed on stage 3 chronic kidney disease 07/01/2015  . Herpes ocular   . Acute on chronic systolic heart failure  03/00/9233  . Facial rash 06/19/2015  . Diabetes mellitus type 2, controlled 06/19/2015  . Essential hypertension 06/19/2015  . Chronic anemia 06/19/2015  . CHF (congestive heart failure) 06/19/2015  . CHF exacerbation 06/18/2015  . Non-ischemic cardiomyopathy- EF 15% echo Aug 2015 11/24/2014  . Chronic anticoagulation 11/24/2014  . S/P left THA, AA 09/12/2014  . Gout 04/29/2014  . Ocular herpes   . Neuropathy   . OSA (obstructive sleep apnea) 07/09/2013  . Chronic systolic heart failure 00/76/2263  . 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 PAF (paroxysmal atrial fibrillation) 07/03/2013  . Hypertension   . COPD (chronic obstructive pulmonary disease)   . Diabetes mellitus with nephropathy   . CKD (chronic kidney disease), stage III   . Polysubstance abuse      Prior to Admission medications   Medication Sig Start Date End Date Taking? Authorizing Provider  ACCU-CHEK FASTCLIX LANCETS MISC Test blood sugar once daily. Dx code: E11.8 12/20/14  Yes Darreld Mclean, MD  acetaminophen (TYLENOL) 500 MG tablet Take 500 mg by mouth every 6 (six) hours as needed for moderate pain or headache.   Yes Historical Provider, MD  albuterol (PROVENTIL HFA;VENTOLIN HFA) 108 (90 BASE) MCG/ACT inhaler Inhale 2 puffs into the lungs every 6 (six) hours as needed for wheezing or shortness of breath. 06/30/15  Yes Darreld Mclean, MD  Alcohol Swabs (B-D SINGLE  USE SWABS REGULAR) PADS Test blood sugar daily. Dx code: E11.8 12/20/14  Yes Gay Filler Copland, MD  allopurinol (ZYLOPRIM) 100 MG tablet Take 100 mg by mouth daily.   Yes Historical Provider, MD  ALPHA LIPOIC ACID PO Take 1 capsule by mouth daily.   Yes Historical Provider, MD  apixaban (ELIQUIS) 5 MG TABS tablet Take 1 tablet (5 mg total) by mouth 2 (two) times daily. 02/04/15  Yes Jolaine Artist, MD  Ascorbic Acid (VITAMIN C PO) Take 1 tablet by mouth daily.   Yes Historical Provider, MD  b complex vitamins tablet Take 1 tablet by mouth daily.    Yes Historical Provider, MD  baclofen (LIORESAL) 10 MG tablet TAKE 1 TABLET EVERY 8 HOURS AS NEEDED FOR MUSCLE SPASM(S) 07/23/15  Yes Gay Filler Copland, MD  Blood Glucose Calibration (ACCU-CHEK SMARTVIEW CONTROL) LIQD Test blood sugar once daily. Dx code: E11.8. 12/20/14  Yes Darreld Mclean, MD  Blood Glucose Monitoring Suppl (ACCU-CHEK NANO SMARTVIEW) W/DEVICE KIT Test blood sugar daily. Dx code: E11.8 12/20/14  Yes Gay Filler Copland, MD  carvedilol (COREG) 25 MG tablet TAKE 1/2 TABLET TWICE DAILY  WITH  A  MEAL 04/17/15  Yes Jolaine Artist, MD  Celery Seed OIL Take 1 capsule by mouth daily.   Yes Historical Provider, MD  Coenzyme Q10 (COQ10) 100 MG CAPS Take 100 mg by mouth 2 (two) times daily.   Yes Historical Provider, MD  colchicine 0.6 MG tablet Take 1.2 mg by mouth daily as needed (Gout). Colcrys   Yes Historical Provider, MD  diclofenac (FLECTOR) 1.3 % PTCH Place 1 patch onto the skin 2 (two) times daily as needed (pain).   Yes Historical Provider, MD  docusate sodium 100 MG CAPS Take 100 mg by mouth 2 (two) times daily. 09/16/14  Yes Matthew Babish, PA-C  glipiZIDE (GLUCOTROL) 5 MG tablet Take 1 tablet (5 mg total) by mouth 2 (two) times daily. 12/20/14  Yes Jessica C Copland, MD  glucose blood (ACCU-CHEK SMARTVIEW) test strip Test blood sugar once daily. Dx code: E11.8 12/20/14  Yes Gay Filler Copland, MD  HAWTHORN EXTRACT PO Take 1 capsule by mouth daily.   Yes Historical Provider, MD  hydrALAZINE (APRESOLINE) 50 MG tablet Take 1 tablet (50 mg total) by mouth 3 (three) times daily. 07/11/15  Yes Larey Dresser, MD  isosorbide mononitrate (IMDUR) 60 MG 24 hr tablet TAKE 1 TABLET (60 MG TOTAL) BY MOUTH DAILY. 01/03/15  Yes Jolaine Artist, MD  metolazone (ZAROXOLYN) 5 MG tablet TAKE 1 TABLET EVERY DAY AS NEEDED FOR SWELLING 08/04/15  Yes Jolaine Artist, MD  omega-3 acid ethyl esters (LOVAZA) 1 G capsule Take 1 g by mouth daily.   Yes Historical Provider, MD  oxyCODONE-acetaminophen  (PERCOCET) 10-325 MG per tablet Take 1 tablet by mouth 4 (four) times daily. scheduled 07/23/15  Yes Jessica C Copland, MD  polyethylene glycol powder (GLYCOLAX/MIRALAX) powder MIX 1 CAPFUL (17GM)  IN  LIQUID  AND  DRINK EVERY DAY AS DIRECTED 07/31/15  Yes Jessica C Copland, MD  potassium chloride SA (K-DUR,KLOR-CON) 20 MEQ tablet Take 1 tablet (20 mEq total) by mouth daily. 08/08/15  Yes Jolaine Artist, MD  prednisoLONE acetate (PRED FORTE) 1 % ophthalmic suspension Place 1 drop into both eyes 4 (four) times daily.   Yes Historical Provider, MD  spironolactone (ALDACTONE) 25 MG tablet Take 0.5 tablets (12.5 mg total) by mouth daily. 07/04/15  Yes Asiyah Cletis Media, MD  torsemide (Pyatt) 20  MG tablet TAKE 2 TABLETS BY MOUTH 2 TIMES DAILY 08/11/15  Yes Gay Filler Copland, MD  VITAMIN E PO Take 1 capsule by mouth daily.   Yes Historical Provider, MD    Allergies  Allergen Reactions  . Corlanor [Ivabradine] Palpitations and Other (See Comments)    Kidney and heart problems, syncope  . Januvia [Sitagliptin] Other (See Comments)    "Almost killed me"   . Lunesta [Eszopiclone] Palpitations and Other (See Comments)    Kidney and heart problems, syncope  . Actos [Pioglitazone] Other (See Comments)    Caused blood in urine and fluid retention      Objective:  Physical Exam  Constitutional: He is oriented to person, place, and time. He appears well-developed and well-nourished.  HENT:  Head: Normocephalic and atraumatic.  Eyes: Pupils are equal, round, and reactive to light.  Neck: Normal range of motion. Neck supple.  Cardiovascular: Normal rate and regular rhythm.   Pulmonary/Chest: Effort normal and breath sounds normal.  Abdominal: Soft. Bowel sounds are normal. He exhibits no distension and no mass. There is tenderness (Mild suprapubic tenderness). There is no rebound and no guarding.  Musculoskeletal:       Arms: Neurological: He is alert and oriented to person, place, and time.    Skin: Skin is warm and dry.  Psychiatric: He has a normal mood and affect. His behavior is normal. Thought content normal.     BP 100/68 mmHg  Pulse 74  Temp(Src) 97.8 F (36.6 C) (Oral)  Resp 18  Ht '5\' 7"'  (1.702 m)  Wt 223 lb 12.8 oz (101.515 kg)  BMI 35.04 kg/m2  SpO2 97%   Results for orders placed or performed in visit on 08/12/15  POCT urinalysis dipstick  Result Value Ref Range   Color, UA yellow yellow   Clarity, UA clear clear   Glucose, UA negative negative   Bilirubin, UA negative negative   Ketones, POC UA negative negative   Spec Grav, UA 1.010    Blood, UA moderate (A) negative   pH, UA 7.0    Protein Ur, POC negative negative   Urobilinogen, UA 0.2    Nitrite, UA Negative Negative   Leukocytes, UA small (1+) (A) Negative  POCT Microscopic Urinalysis ([facility])  Result Value Ref Range   WBC,UR,HPF,POC Few (A) None WBC/hpf   RBC,UR,HPF,POC Moderate (A) None RBC/hpf   Bacteria None None   Mucus Absent Absent   Epithelial Cells, UR Per Microscopy Few (A) None cells/hpf     Assessment & Plan:  1. Dysuria   2. Urinary incontinence, unspecified incontinence type Dipstick/urinalysis revealed a small amount of WBCs and moderate RBCs. Treat for 2 weeks with Bactrim. Await culture results. Patient counseled to RTC if he is not feeling significantly better in 48 hours or if symptoms worsen. - POCT urinalysis dipstick - POCT Microscopic Urinalysis ([facility]) - Urine culture - sulfamethoxazole-trimethoprim (BACTRIM DS,SEPTRA DS) 800-160 MG per tablet; Take 1 tablet by mouth 2 (two) times daily.  Dispense: 28 tablet; Refill: 0   Amber D. Race, PA-S Physician Assistant Student [facility]

## 2015-08-12 NOTE — Telephone Encounter (Signed)
Advised pt to come in. Pt agreed. We do not give out antibiotics without an office visit. Pt understood.

## 2015-08-12 NOTE — Progress Notes (Signed)
Patient ID: Frank Davidson, male    DOB: March 11, 1951, 64 y.o.   MRN: [id number]  PCP: [name], MD  Subjective:   Chief Complaint  Patient presents with  . Urinary Frequency    burning and pain  x  1 week  . Referral    For pain management dr.    Harrison Mons Presents for evaluation of urinary frequency x 1 week.   He has occasional dysuria. He noticed that his urine was a little brown/reddish. He has had to wear Depends because he cannot hold his urine. No abdominal pain. No fever. Complains of low back pain. No myalgias. He is not sexually active. No penile discharge or pain.   He is asking about his referral for pain management. He states that the office here has been working on this referral for a month. He is s/p total hip replacement in November of 2015. He states he needs his right hip replaced as well. He has chronic pain in his shoulders, pain in his right elbow from gout, and chronic pain in his knees from osteoarthritis.   He also states that he had shingles one month ago on his face, and he continues to have to use eye drops for red eye dryness. No residual pain from where the shingles rash was.   Review of Systems Constitutional: Positive for chills ("I'm cold all the time."). Negative for fever.  Gastrointestinal: Positive for constipation ("With 4 percocets a day I'm clogged. I take a laxative, Miralax and eat a lot of vegetables and fruits."). Negative for nausea, vomiting, abdominal pain and diarrhea.  Genitourinary: Positive for dysuria and hematuria. Negative for flank pain.  Musculoskeletal: Positive for back pain. Negative for myalgias.      Patient Active Problem List   Diagnosis Date Noted  . Hip pain 07/23/2015  . Acute exacerbation of CHF (congestive heart failure)   . Dyspnea   . Acute renal failure superimposed on stage 3 chronic kidney disease 07/01/2015  . Herpes ocular   . Acute on chronic systolic heart failure 93/81/0175  . Facial rash  06/19/2015  . Diabetes mellitus type 2, controlled 06/19/2015  . Essential hypertension 06/19/2015  . Chronic anemia 06/19/2015  . CHF (congestive heart failure) 06/19/2015  . CHF exacerbation 06/18/2015  . Non-ischemic cardiomyopathy- EF 15% echo Aug 2015 11/24/2014  . Chronic anticoagulation 11/24/2014  . S/P left THA, AA 09/12/2014  . Gout 04/29/2014  . Ocular herpes   . Neuropathy   . OSA (obstructive sleep apnea) 07/09/2013  . Chronic systolic heart failure 09/15/8526  . 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 PAF (paroxysmal atrial fibrillation) 07/03/2013  . Hypertension   . COPD (chronic obstructive pulmonary disease)   . Diabetes mellitus with nephropathy   . CKD (chronic kidney disease), stage III   . Polysubstance abuse      Prior to Admission medications   Medication Sig Start Date End Date Taking? Authorizing Provider  ACCU-CHEK FASTCLIX LANCETS MISC Test blood sugar once daily. Dx code: E11.8 12/20/14  Yes Darreld Mclean, MD  acetaminophen (TYLENOL) 500 MG tablet Take 500 mg by mouth every 6 (six) hours as needed for moderate pain or headache.   Yes Historical Provider, MD  albuterol (PROVENTIL HFA;VENTOLIN HFA) 108 (90 BASE) MCG/ACT inhaler Inhale 2 puffs into the lungs every 6 (six) hours as needed for wheezing or shortness of breath. 06/30/15  Yes Darreld Mclean, MD  Alcohol Swabs (B-D SINGLE USE SWABS REGULAR) PADS Test  blood sugar daily. Dx code: E11.8 12/20/14  Yes Gay Filler Copland, MD  allopurinol (ZYLOPRIM) 100 MG tablet Take 100 mg by mouth daily.   Yes Historical Provider, MD  ALPHA LIPOIC ACID PO Take 1 capsule by mouth daily.   Yes Historical Provider, MD  apixaban (ELIQUIS) 5 MG TABS tablet Take 1 tablet (5 mg total) by mouth 2 (two) times daily. 02/04/15  Yes Jolaine Artist, MD  Ascorbic Acid (VITAMIN C PO) Take 1 tablet by mouth daily.   Yes Historical Provider, MD  b complex vitamins tablet Take 1 tablet by mouth daily.   Yes Historical Provider,  MD  baclofen (LIORESAL) 10 MG tablet TAKE 1 TABLET EVERY 8 HOURS AS NEEDED FOR MUSCLE SPASM(S) 07/23/15  Yes Gay Filler Copland, MD  Blood Glucose Calibration (ACCU-CHEK SMARTVIEW CONTROL) LIQD Test blood sugar once daily. Dx code: E11.8. 12/20/14  Yes Darreld Mclean, MD  Blood Glucose Monitoring Suppl (ACCU-CHEK NANO SMARTVIEW) W/DEVICE KIT Test blood sugar daily. Dx code: E11.8 12/20/14  Yes Gay Filler Copland, MD  carvedilol (COREG) 25 MG tablet TAKE 1/2 TABLET TWICE DAILY  WITH  A  MEAL 04/17/15  Yes Jolaine Artist, MD  Celery Seed OIL Take 1 capsule by mouth daily.   Yes Historical Provider, MD  Coenzyme Q10 (COQ10) 100 MG CAPS Take 100 mg by mouth 2 (two) times daily.   Yes Historical Provider, MD  colchicine 0.6 MG tablet Take 1.2 mg by mouth daily as needed (Gout). Colcrys   Yes Historical Provider, MD  diclofenac (FLECTOR) 1.3 % PTCH Place 1 patch onto the skin 2 (two) times daily as needed (pain).   Yes Historical Provider, MD  docusate sodium 100 MG CAPS Take 100 mg by mouth 2 (two) times daily. 09/16/14  Yes Matthew Babish, PA-C  glipiZIDE (GLUCOTROL) 5 MG tablet Take 1 tablet (5 mg total) by mouth 2 (two) times daily. 12/20/14  Yes Jessica C Copland, MD  glucose blood (ACCU-CHEK SMARTVIEW) test strip Test blood sugar once daily. Dx code: E11.8 12/20/14  Yes Gay Filler Copland, MD  HAWTHORN EXTRACT PO Take 1 capsule by mouth daily.   Yes Historical Provider, MD  hydrALAZINE (APRESOLINE) 50 MG tablet Take 1 tablet (50 mg total) by mouth 3 (three) times daily. 07/11/15  Yes Larey Dresser, MD  isosorbide mononitrate (IMDUR) 60 MG 24 hr tablet TAKE 1 TABLET (60 MG TOTAL) BY MOUTH DAILY. 01/03/15  Yes Jolaine Artist, MD  metolazone (ZAROXOLYN) 5 MG tablet TAKE 1 TABLET EVERY DAY AS NEEDED FOR SWELLING 08/04/15  Yes Jolaine Artist, MD  omega-3 acid ethyl esters (LOVAZA) 1 G capsule Take 1 g by mouth daily.   Yes Historical Provider, MD  oxyCODONE-acetaminophen (PERCOCET) 10-325 MG per  tablet Take 1 tablet by mouth 4 (four) times daily. scheduled 07/23/15  Yes Jessica C Copland, MD  polyethylene glycol powder (GLYCOLAX/MIRALAX) powder MIX 1 CAPFUL (17GM)  IN  LIQUID  AND  DRINK EVERY DAY AS DIRECTED 07/31/15  Yes Jessica C Copland, MD  potassium chloride SA (K-DUR,KLOR-CON) 20 MEQ tablet Take 1 tablet (20 mEq total) by mouth daily. 08/08/15  Yes Jolaine Artist, MD  prednisoLONE acetate (PRED FORTE) 1 % ophthalmic suspension Place 1 drop into both eyes 4 (four) times daily.   Yes Historical Provider, MD  spironolactone (ALDACTONE) 25 MG tablet Take 0.5 tablets (12.5 mg total) by mouth daily. 07/04/15  Yes Asiyah Cletis Media, MD  torsemide (DEMADEX) 20 MG tablet TAKE 2 TABLETS  BY MOUTH 2 TIMES DAILY 08/11/15  Yes Gay Filler Copland, MD  VITAMIN E PO Take 1 capsule by mouth daily.   Yes Historical Provider, MD     Allergies  Allergen Reactions  . Corlanor [Ivabradine] Palpitations and Other (See Comments)    Kidney and heart problems, syncope  . Januvia [Sitagliptin] Other (See Comments)    "Almost killed me"   . Lunesta [Eszopiclone] Palpitations and Other (See Comments)    Kidney and heart problems, syncope  . Actos [Pioglitazone] Other (See Comments)    Caused blood in urine and fluid retention        Objective:  Physical Exam  Constitutional: He is oriented to person, place, and time. Vital signs are normal. He appears well-developed and well-nourished. He is active and cooperative. No distress.  BP 100/68 mmHg  Pulse 74  Temp(Src) 97.8 F (36.6 C) (Oral)  Resp 18  Ht '5\' 7"'  (1.702 m)  Wt 223 lb 12.8 oz (101.515 kg)  BMI 35.04 kg/m2  SpO2 97%   HENT:  Head: Normocephalic and atraumatic.  Neck: Neck supple. No thyromegaly present.  Cardiovascular: Normal rate, regular rhythm and normal heart sounds.   Pulmonary/Chest: Effort normal and breath sounds normal.  Abdominal: Soft. Normal appearance and bowel sounds are normal. He exhibits no distension and no mass.  There is no hepatosplenomegaly. There is tenderness in the suprapubic area. There is no rigidity, no rebound, no guarding, no CVA tenderness, no tenderness at McBurney's point and negative Murphy's sign. No hernia.  Musculoskeletal: Normal range of motion.       Lumbar back: Normal.  Lymphadenopathy:    He has no cervical adenopathy.  Neurological: He is alert and oriented to person, place, and time.  Skin: Skin is warm and dry. No rash noted. He is not diaphoretic. No pallor.  Psychiatric: He has a normal mood and affect. His speech is normal and behavior is normal. Judgment normal.           Results for orders placed or performed in visit on 08/12/15  POCT urinalysis dipstick  Result Value Ref Range   Color, UA yellow yellow   Clarity, UA clear clear   Glucose, UA negative negative   Bilirubin, UA negative negative   Ketones, POC UA negative negative   Spec Grav, UA 1.010    Blood, UA moderate (A) negative   pH, UA 7.0    Protein Ur, POC negative negative   Urobilinogen, UA 0.2    Nitrite, UA Negative Negative   Leukocytes, UA small (1+) (A) Negative  POCT Microscopic Urinalysis ([facility])  Result Value Ref Range   WBC,UR,HPF,POC Few (A) None WBC/hpf   RBC,UR,HPF,POC Moderate (A) None RBC/hpf   Bacteria None None   Mucus Absent Absent   Epithelial Cells, UR Per Microscopy Few (A) None cells/hpf    Assessment & Plan:   1. Dysuria 2. Urinary incontinence, unspecified incontinence type Treat for acute prostatitis. Anticipatory guidance provided. Supportive care. If not significantly improved in 24-48 hours, RTC, sooner if worsens. - POCT urinalysis dipstick - POCT Microscopic Urinalysis ([facility]) - Urine culture - sulfamethoxazole-trimethoprim (BACTRIM DS,SEPTRA DS) 800-160 MG per tablet; Take 1 tablet by mouth 2 (two) times daily.  Dispense: 28 tablet; Refill: 0   Fara Chute, PA-C Physician Assistant-Certified [facility]

## 2015-08-12 NOTE — Telephone Encounter (Signed)
Received below message from [facility], pt has refused home O2 at this time, will send to Dr Shirlee Latch for review

## 2015-08-12 NOTE — Patient Instructions (Signed)
The referral to pain management is being reviewed by the pain specialists. They ask for 3 weeks to contact you. The referral was placed on 07/31/2015, so if you have near anything by 9/29, please contact us.  You should feel significantly improved in 48 hours. If not, or if your symptoms worsen before then, please return for additional evaluation.  I will contact you with your lab results as soon as they are available.   If you have not heard from me in 2 weeks, please contact me.  The fastest way to get your results is to register for My Chart (see the instructions on the last page of this printout).

## 2015-08-12 NOTE — Telephone Encounter (Signed)
Patient is calling because he has an infection that spread from his eye to his urine and needs an antibiotic.  Please advise! [phone]

## 2015-08-13 ENCOUNTER — Ambulatory Visit (HOSPITAL_COMMUNITY)
Admission: RE | Admit: 2015-08-13 | Discharge: 2015-08-13 | Disposition: A | Discharge status: Home / Self Care | Payer: Commercial Managed Care - HMO | Source: Ambulatory Visit | Attending: Cardiology | Admitting: Cardiology

## 2015-08-13 DIAGNOSIS — J438 Other emphysema: Secondary | ICD-10-CM | POA: Diagnosis not present

## 2015-08-13 LAB — PULMONARY FUNCTION TEST
DL/VA % pred: 87 %
DL/VA: 3.85 ml/min/mmHg/L
DLCO UNC % PRED: 48 %
DLCO unc: 13.78 ml/min/mmHg
FEF 25-75 PRE: 0.71 L/s
FEF 25-75 Post: 0.86 L/sec
FEF2575-%CHANGE-POST: 21 %
FEF2575-%PRED-POST: 35 %
FEF2575-%PRED-PRE: 29 %
FEV1-%Change-Post: 8 %
FEV1-%PRED-POST: 56 %
FEV1-%Pred-Pre: 51 %
FEV1-PRE: 1.37 L
FEV1-Post: 1.49 L
FEV1FVC-%CHANGE-POST: 15 %
FEV1FVC-%Pred-Pre: 83 %
FEV6-%Change-Post: -6 %
FEV6-%PRED-PRE: 63 %
FEV6-%Pred-Post: 58 %
FEV6-PRE: 2.1 L
FEV6-Post: 1.96 L
FEV6FVC-%Change-Post: 0 %
FEV6FVC-%PRED-PRE: 103 %
FEV6FVC-%Pred-Post: 102 %
FVC-%CHANGE-POST: -6 %
FVC-%PRED-POST: 57 %
FVC-%PRED-PRE: 61 %
FVC-POST: 2.01 L
FVC-PRE: 2.14 L
POST FEV1/FVC RATIO: 74 %
POST FEV6/FVC RATIO: 97 %
PRE FEV1/FVC RATIO: 64 %
Pre FEV6/FVC Ratio: 98 %
RV % pred: 98 %
RV: 2.16 L
TLC % pred: 73 %
TLC: 4.73 L

## 2015-08-13 MED ORDER — ALBUTEROL SULFATE (2.5 MG/3ML) 0.083% IN NEBU
2.5000 mg | INHALATION_SOLUTION | Freq: Once | RESPIRATORY_TRACT | Status: AC
  Administered 2015-08-13: 2.5 mg via RESPIRATORY_TRACT

## 2015-08-14 ENCOUNTER — Ambulatory Visit: Payer: Commercial Managed Care - HMO

## 2015-08-14 ENCOUNTER — Other Ambulatory Visit: Payer: Self-pay

## 2015-08-14 NOTE — Patient Outreach (Signed)
[facility]) Care Management  08/14/2015  Frank Davidson December 15, 1950 [id number]   Telephone call to patient for follow up from initial assessment.  No answer.  HIPAA compliant voice message.   Plan: RN Health Coach will attempt patient within 1-2 weeks.    Bary Leriche, RN, MSN [facility] Care Management RN Telephonic Health Coach [phone]

## 2015-08-15 LAB — URINE CULTURE

## 2015-08-19 ENCOUNTER — Encounter: Payer: Self-pay | Admitting: Physician Assistant

## 2015-08-21 ENCOUNTER — Telehealth: Payer: Self-pay

## 2015-08-21 DIAGNOSIS — G894 Chronic pain syndrome: Secondary | ICD-10-CM

## 2015-08-21 NOTE — Telephone Encounter (Signed)
Patient needs a referral for pain management and a refill for percocet

## 2015-08-21 NOTE — Telephone Encounter (Signed)
Referral and percocet

## 2015-08-22 ENCOUNTER — Other Ambulatory Visit: Payer: Self-pay

## 2015-08-22 MED ORDER — OXYCODONE-ACETAMINOPHEN 10-325 MG PO TABS: 1.0000 | ORAL_TABLET | Freq: Four times a day (QID) | ORAL | Status: DC

## 2015-08-22 NOTE — Telephone Encounter (Signed)
Called and LMOM- I did his refill and called to let him know. Referral for pain management has been placed, towards the end of august.  Has he heard anything? Is there a particular provider or clinic that he wants to see? I am glad to help facilitate

## 2015-08-22 NOTE — Patient Outreach (Signed)
[facility]) Care Management  [place]  08/22/2015   Frank Davidson 1951/05/10 [id number]  Subjective: Telephone call to patient for outreach. Patient reports that he is still waiting on pain management referral.  He states he does have his pain medication and that his primary doctor writes the prescription.  Currently rates pain at 6/10.    Heart Failure: Patient reports that his weight is down to 221 lbs.  Patient reports he is maintaining fluid restriction of 2 liters.  Discussed with patient importance of continuing to monitor weight and notifying physician when needed for increased weights.  He verbalized understanding.   Objective:   Current Medications:  Current Outpatient Prescriptions  Medication Sig Dispense Refill  . ACCU-CHEK FASTCLIX LANCETS MISC Test blood sugar once daily. Dx code: E11.8 100 each 3  . acetaminophen (TYLENOL) 500 MG tablet Take 500 mg by mouth every 6 (six) hours as needed for moderate pain or headache.    . albuterol (PROVENTIL HFA;VENTOLIN HFA) 108 (90 BASE) MCG/ACT inhaler Inhale 2 puffs into the lungs every 6 (six) hours as needed for wheezing or shortness of breath. 1 Inhaler 1  . Alcohol Swabs (B-D SINGLE USE SWABS REGULAR) PADS Test blood sugar daily. Dx code: E11.8 100 each 3  . allopurinol (ZYLOPRIM) 100 MG tablet Take 100 mg by mouth daily.    . ALPHA LIPOIC ACID PO Take 1 capsule by mouth daily.    Marland Kitchen apixaban (ELIQUIS) 5 MG TABS tablet Take 1 tablet (5 mg total) by mouth 2 (two) times daily. 180 tablet 3  . Ascorbic Acid (VITAMIN C PO) Take 1 tablet by mouth daily.    Marland Kitchen b complex vitamins tablet Take 1 tablet by mouth daily.    . baclofen (LIORESAL) 10 MG tablet TAKE 1 TABLET EVERY 8 HOURS AS NEEDED FOR MUSCLE SPASM(S) 90 tablet 1  . Blood Glucose Calibration (ACCU-CHEK SMARTVIEW CONTROL) LIQD Test blood sugar once daily. Dx code: E11.8. 1 each 3  . Blood Glucose Monitoring Suppl (ACCU-CHEK NANO SMARTVIEW) W/DEVICE KIT  Test blood sugar daily. Dx code: E11.8 1 kit 0  . carvedilol (COREG) 25 MG tablet TAKE 1/2 TABLET TWICE DAILY  WITH  A  MEAL 90 tablet 2  . Celery Seed OIL Take 1 capsule by mouth daily.    . Coenzyme Q10 (COQ10) 100 MG CAPS Take 100 mg by mouth 2 (two) times daily.    . colchicine 0.6 MG tablet Take 1.2 mg by mouth daily as needed (Gout). Colcrys    . diclofenac (FLECTOR) 1.3 % PTCH Place 1 patch onto the skin 2 (two) times daily as needed (pain).    Marland Kitchen docusate sodium 100 MG CAPS Take 100 mg by mouth 2 (two) times daily. 10 capsule 0  . glipiZIDE (GLUCOTROL) 5 MG tablet Take 1 tablet (5 mg total) by mouth 2 (two) times daily. 180 tablet 3  . glucose blood (ACCU-CHEK SMARTVIEW) test strip Test blood sugar once daily. Dx code: E11.8 100 each 3  . HAWTHORN EXTRACT PO Take 1 capsule by mouth daily.    . hydrALAZINE (APRESOLINE) 50 MG tablet Take 1 tablet (50 mg total) by mouth 3 (three) times daily. 90 tablet 3  . isosorbide mononitrate (IMDUR) 60 MG 24 hr tablet TAKE 1 TABLET (60 MG TOTAL) BY MOUTH DAILY. 90 tablet 2  . metolazone (ZAROXOLYN) 5 MG tablet TAKE 1 TABLET EVERY DAY AS NEEDED FOR SWELLING 30 tablet 2  . omega-3 acid ethyl esters (LOVAZA) 1  G capsule Take 1 g by mouth daily.    Marland Kitchen oxyCODONE-acetaminophen (PERCOCET) 10-325 MG tablet Take 1 tablet by mouth 4 (four) times daily. scheduled 120 tablet 0  . polyethylene glycol powder (GLYCOLAX/MIRALAX) powder MIX 1 CAPFUL (17GM)  IN  LIQUID  AND  DRINK EVERY DAY AS DIRECTED 1530 g 3  . potassium chloride SA (K-DUR,KLOR-CON) 20 MEQ tablet Take 1 tablet (20 mEq total) by mouth daily. 30 tablet 6  . prednisoLONE acetate (PRED FORTE) 1 % ophthalmic suspension Place 1 drop into both eyes 4 (four) times daily.    Marland Kitchen spironolactone (ALDACTONE) 25 MG tablet Take 0.5 tablets (12.5 mg total) by mouth daily. 90 tablet 3  . sulfamethoxazole-trimethoprim (BACTRIM DS,SEPTRA DS) 800-160 MG per tablet Take 1 tablet by mouth 2 (two) times daily. 28 tablet 0  .  torsemide (DEMADEX) 20 MG tablet TAKE 2 TABLETS BY MOUTH 2 TIMES DAILY 120 tablet 4  . VITAMIN E PO Take 1 capsule by mouth daily.     No current facility-administered medications for this visit.    Functional Status:  In your present state of health, do you have any difficulty performing the following activities: 07/31/2015 07/25/2015  Hearing? N N  Vision? N N  Difficulty concentrating or making decisions? N N  Walking or climbing stairs? N N  Dressing or bathing? N N  Doing errands, shopping? [name]  Preparing Food and eating ? N N  Using the Toilet? N N  In the past six months, have you accidently leaked urine? N N  Do you have problems with loss of bowel control? N N  Managing your Medications? N N  Managing your Finances? N N  Housekeeping or managing your Housekeeping? [name]    Fall/Depression Screening: PHQ 2/9 Scores 08/22/2015 08/12/2015 07/31/2015 07/25/2015 07/23/2015 07/16/2015 06/30/2015  PHQ - 2 Score 0 0 0 0 0 0 0    Assessment:  Patient continues to benefit from health coach outreach calls for disease management.  Plan:  [facility] CM Care Plan Problem Two        Most Recent Value   Care Plan Problem Two  Monitoring heart failure   Role Documenting the Problem [place] for Problem Two  Active   Interventions for Problem Two Long Term Goal   Reviewed yellow zone of heart failure chart.   THN Long Term Goal (31-90) days  Patient will be able to follow and understand heart failure zone chart within 90 days   THN Long Term Goal Start Date  07/31/15   THN CM Short Term Goal #1 (0-30 days)  Patient will continue daily weights and diet related to heart failure within 30 days   THN CM Short Term Goal #1 Start Date  08/22/15 [goal continued]   Interventions for Short Term Goal #2   Reviewed with patient the importance of daily weights and when to notify physician.   THN CM Short Term Goal #2 (0-30 days)  Patient will be able to discuss yellow zone of heart failure chart  within 30 days.     THN CM Short Term Goal #2 Start Date  08/22/15 [goal continued]   Interventions for Short Term Goal #2  Reviewed yellow zone of heart failure chart.       RN Health Coach will contact patient within one month and patient agrees to next outreach.  Jone Baseman, RN, MSN [place] [phone]

## 2015-08-26 ENCOUNTER — Telehealth (HOSPITAL_COMMUNITY): Payer: Self-pay | Admitting: Vascular Surgery

## 2015-08-26 ENCOUNTER — Telehealth: Payer: Self-pay

## 2015-08-26 NOTE — Telephone Encounter (Signed)
Please call pt he has gained 10lbs in 2 days.. Please advise

## 2015-08-26 NOTE — Telephone Encounter (Signed)
The patient called to ask for a refill of his pain medicine to last him until he is able to get into a pain management facility.  I spoke to the [facility] regarding the status of the referral, and they said it would probably be another couple weeks before they will finish the review process.  They said it takes about 6 weeks before a patient is scheduled, which would be about 2 more weeks for the patient to wait.  Please advise about the possibility of a medication refill.  CB#: [phone]

## 2015-08-27 ENCOUNTER — Inpatient Hospital Stay (HOSPITAL_COMMUNITY)
Admission: EM | Admit: 2015-08-27 | Discharge: 2015-09-03 | DRG: 682 | Disposition: A | Discharge status: Home Health | Payer: Commercial Managed Care - HMO | Attending: Family Medicine | Admitting: Family Medicine

## 2015-08-27 ENCOUNTER — Inpatient Hospital Stay (HOSPITAL_COMMUNITY): Payer: Commercial Managed Care - HMO

## 2015-08-27 ENCOUNTER — Encounter (HOSPITAL_COMMUNITY): Payer: Self-pay | Admitting: *Deleted

## 2015-08-27 ENCOUNTER — Ambulatory Visit (INDEPENDENT_AMBULATORY_CARE_PROVIDER_SITE_OTHER): Payer: Commercial Managed Care - HMO | Admitting: Emergency Medicine

## 2015-08-27 ENCOUNTER — Emergency Department (HOSPITAL_COMMUNITY): Payer: Commercial Managed Care - HMO

## 2015-08-27 VITALS — BP 86/44 | HR 76 | Temp 97.5°F | Resp 14 | Wt 246.7 lb

## 2015-08-27 DIAGNOSIS — E876 Hypokalemia: Secondary | ICD-10-CM | POA: Diagnosis not present

## 2015-08-27 DIAGNOSIS — Z7901 Long term (current) use of anticoagulants: Secondary | ICD-10-CM | POA: Diagnosis not present

## 2015-08-27 DIAGNOSIS — N184 Chronic kidney disease, stage 4 (severe): Secondary | ICD-10-CM

## 2015-08-27 DIAGNOSIS — R0602 Shortness of breath: Secondary | ICD-10-CM | POA: Diagnosis present

## 2015-08-27 DIAGNOSIS — I5021 Acute systolic (congestive) heart failure: Secondary | ICD-10-CM

## 2015-08-27 DIAGNOSIS — I480 Paroxysmal atrial fibrillation: Secondary | ICD-10-CM | POA: Diagnosis present

## 2015-08-27 DIAGNOSIS — M1990 Unspecified osteoarthritis, unspecified site: Secondary | ICD-10-CM | POA: Diagnosis present

## 2015-08-27 DIAGNOSIS — E875 Hyperkalemia: Secondary | ICD-10-CM | POA: Diagnosis present

## 2015-08-27 DIAGNOSIS — E785 Hyperlipidemia, unspecified: Secondary | ICD-10-CM | POA: Diagnosis present

## 2015-08-27 DIAGNOSIS — I959 Hypotension, unspecified: Secondary | ICD-10-CM | POA: Insufficient documentation

## 2015-08-27 DIAGNOSIS — D638 Anemia in other chronic diseases classified elsewhere: Secondary | ICD-10-CM | POA: Diagnosis present

## 2015-08-27 DIAGNOSIS — I429 Cardiomyopathy, unspecified: Secondary | ICD-10-CM | POA: Diagnosis present

## 2015-08-27 DIAGNOSIS — N185 Chronic kidney disease, stage 5: Secondary | ICD-10-CM | POA: Diagnosis present

## 2015-08-27 DIAGNOSIS — F19939 Other psychoactive substance use, unspecified with withdrawal, unspecified: Secondary | ICD-10-CM | POA: Diagnosis present

## 2015-08-27 DIAGNOSIS — I5023 Acute on chronic systolic (congestive) heart failure: Secondary | ICD-10-CM

## 2015-08-27 DIAGNOSIS — I509 Heart failure, unspecified: Secondary | ICD-10-CM

## 2015-08-27 DIAGNOSIS — E871 Hypo-osmolality and hyponatremia: Secondary | ICD-10-CM | POA: Diagnosis present

## 2015-08-27 DIAGNOSIS — N170 Acute kidney failure with tubular necrosis: Secondary | ICD-10-CM | POA: Diagnosis present

## 2015-08-27 DIAGNOSIS — G629 Polyneuropathy, unspecified: Secondary | ICD-10-CM | POA: Diagnosis present

## 2015-08-27 DIAGNOSIS — Z8673 Personal history of transient ischemic attack (TIA), and cerebral infarction without residual deficits: Secondary | ICD-10-CM | POA: Diagnosis not present

## 2015-08-27 DIAGNOSIS — M109 Gout, unspecified: Secondary | ICD-10-CM | POA: Diagnosis present

## 2015-08-27 DIAGNOSIS — I472 Ventricular tachycardia: Secondary | ICD-10-CM | POA: Diagnosis present

## 2015-08-27 DIAGNOSIS — I9589 Other hypotension: Secondary | ICD-10-CM

## 2015-08-27 DIAGNOSIS — E669 Obesity, unspecified: Secondary | ICD-10-CM | POA: Diagnosis present

## 2015-08-27 DIAGNOSIS — J449 Chronic obstructive pulmonary disease, unspecified: Secondary | ICD-10-CM | POA: Diagnosis present

## 2015-08-27 DIAGNOSIS — G8929 Other chronic pain: Secondary | ICD-10-CM | POA: Diagnosis present

## 2015-08-27 DIAGNOSIS — Z23 Encounter for immunization: Secondary | ICD-10-CM

## 2015-08-27 DIAGNOSIS — K567 Ileus, unspecified: Secondary | ICD-10-CM | POA: Diagnosis present

## 2015-08-27 DIAGNOSIS — I10 Essential (primary) hypertension: Secondary | ICD-10-CM | POA: Diagnosis not present

## 2015-08-27 DIAGNOSIS — E1122 Type 2 diabetes mellitus with diabetic chronic kidney disease: Secondary | ICD-10-CM | POA: Diagnosis present

## 2015-08-27 DIAGNOSIS — R112 Nausea with vomiting, unspecified: Secondary | ICD-10-CM

## 2015-08-27 DIAGNOSIS — N179 Acute kidney failure, unspecified: Secondary | ICD-10-CM

## 2015-08-27 DIAGNOSIS — Z452 Encounter for adjustment and management of vascular access device: Secondary | ICD-10-CM

## 2015-08-27 DIAGNOSIS — Z96642 Presence of left artificial hip joint: Secondary | ICD-10-CM | POA: Diagnosis present

## 2015-08-27 DIAGNOSIS — N183 Chronic kidney disease, stage 3 unspecified: Secondary | ICD-10-CM

## 2015-08-27 DIAGNOSIS — E1140 Type 2 diabetes mellitus with diabetic neuropathy, unspecified: Secondary | ICD-10-CM | POA: Diagnosis present

## 2015-08-27 DIAGNOSIS — G92 Toxic encephalopathy: Secondary | ICD-10-CM | POA: Diagnosis present

## 2015-08-27 DIAGNOSIS — E1151 Type 2 diabetes mellitus with diabetic peripheral angiopathy without gangrene: Secondary | ICD-10-CM | POA: Diagnosis present

## 2015-08-27 DIAGNOSIS — R410 Disorientation, unspecified: Secondary | ICD-10-CM | POA: Diagnosis not present

## 2015-08-27 DIAGNOSIS — Z87891 Personal history of nicotine dependence: Secondary | ICD-10-CM | POA: Diagnosis not present

## 2015-08-27 DIAGNOSIS — G4733 Obstructive sleep apnea (adult) (pediatric): Secondary | ICD-10-CM | POA: Diagnosis present

## 2015-08-27 DIAGNOSIS — I132 Hypertensive heart and chronic kidney disease with heart failure and with stage 5 chronic kidney disease, or end stage renal disease: Secondary | ICD-10-CM | POA: Diagnosis present

## 2015-08-27 DIAGNOSIS — E1121 Type 2 diabetes mellitus with diabetic nephropathy: Secondary | ICD-10-CM | POA: Diagnosis present

## 2015-08-27 DIAGNOSIS — F1423 Cocaine dependence with withdrawal: Secondary | ICD-10-CM | POA: Diagnosis present

## 2015-08-27 DIAGNOSIS — I501 Left ventricular failure: Secondary | ICD-10-CM | POA: Diagnosis not present

## 2015-08-27 LAB — CBC
HEMATOCRIT: 30.9 % — AB (ref 39.0–52.0)
HEMOGLOBIN: 10.3 g/dL — AB (ref 13.0–17.0)
MCH: 28.5 pg (ref 26.0–34.0)
MCHC: 33.3 g/dL (ref 30.0–36.0)
MCV: 85.6 fL (ref 78.0–100.0)
Platelets: 271 10*3/uL (ref 150–400)
RBC: 3.61 MIL/uL — AB (ref 4.22–5.81)
RDW: 16.8 % — AB (ref 11.5–15.5)
WBC: 6.8 10*3/uL (ref 4.0–10.5)

## 2015-08-27 LAB — URINALYSIS, ROUTINE W REFLEX MICROSCOPIC
BILIRUBIN URINE: NEGATIVE
GLUCOSE, UA: NEGATIVE mg/dL
Hgb urine dipstick: NEGATIVE
KETONES UR: NEGATIVE mg/dL
LEUKOCYTES UA: NEGATIVE
Nitrite: NEGATIVE
PH: 5 (ref 5.0–8.0)
Protein, ur: NEGATIVE mg/dL
SPECIFIC GRAVITY, URINE: 1.017 (ref 1.005–1.030)
Urobilinogen, UA: 0.2 mg/dL (ref 0.0–1.0)

## 2015-08-27 LAB — BASIC METABOLIC PANEL
ANION GAP: 14 (ref 5–15)
ANION GAP: 14 (ref 5–15)
BUN: 86 mg/dL — ABNORMAL HIGH (ref 6–20)
BUN: 85 mg/dL — ABNORMAL HIGH (ref 6–20)
CALCIUM: 8.5 mg/dL — AB (ref 8.9–10.3)
CALCIUM: 8.6 mg/dL — AB (ref 8.9–10.3)
CO2: 19 mmol/L — AB (ref 22–32)
CO2: 20 mmol/L — ABNORMAL LOW (ref 22–32)
CREATININE: 7.14 mg/dL — AB (ref 0.61–1.24)
Chloride: 89 mmol/L — ABNORMAL LOW (ref 101–111)
Chloride: 90 mmol/L — ABNORMAL LOW (ref 101–111)
Creatinine, Ser: 7.15 mg/dL — ABNORMAL HIGH (ref 0.61–1.24)
GFR calc Af Amer: 8 mL/min — ABNORMAL LOW (ref >60)
GFR, EST AFRICAN AMERICAN: 8 mL/min — AB (ref >60)
GFR, EST NON AFRICAN AMERICAN: 7 mL/min — AB (ref >60)
GFR, EST NON AFRICAN AMERICAN: 7 mL/min — AB (ref >60)
GLUCOSE: 72 mg/dL (ref 65–99)
GLUCOSE: 58 mg/dL — AB (ref 65–99)
Potassium: 6.2 mmol/L (ref 3.5–5.1)
Potassium: 5.3 mmol/L — ABNORMAL HIGH (ref 3.5–5.1)
SODIUM: 124 mmol/L — AB (ref 135–145)
Sodium: 122 mmol/L — ABNORMAL LOW (ref 135–145)

## 2015-08-27 LAB — SODIUM, URINE, RANDOM: Sodium, Ur: 12 mmol/L

## 2015-08-27 LAB — APTT: APTT: 46 s — AB (ref 24–37)

## 2015-08-27 LAB — CARBOXYHEMOGLOBIN
CARBOXYHEMOGLOBIN: 0.9 % (ref 0.5–1.5)
Methemoglobin: 0.8 % (ref 0.0–1.5)
O2 Saturation: 65.4 %
Total hemoglobin: 8 g/dL — ABNORMAL LOW (ref 13.5–18.0)

## 2015-08-27 LAB — IRON AND TIBC
IRON: 63 ug/dL (ref 45–182)
SATURATION RATIOS: 18 % (ref 17.9–39.5)
TIBC: 347 ug/dL (ref 250–450)
UIBC: 284 ug/dL

## 2015-08-27 LAB — CREATININE, URINE, RANDOM: CREATININE, URINE: 115.66 mg/dL

## 2015-08-27 LAB — MAGNESIUM: Magnesium: 2.5 mg/dL — ABNORMAL HIGH (ref 1.7–2.4)

## 2015-08-27 LAB — MRSA PCR SCREENING: MRSA BY PCR: NEGATIVE

## 2015-08-27 LAB — GLUCOSE, CAPILLARY: GLUCOSE-CAPILLARY: 128 mg/dL — AB (ref 65–99)

## 2015-08-27 LAB — HEPARIN LEVEL (UNFRACTIONATED): Heparin Unfractionated: 0.73 IU/mL — ABNORMAL HIGH (ref 0.30–0.70)

## 2015-08-27 LAB — I-STAT TROPONIN, ED: TROPONIN I, POC: 0.04 ng/mL (ref 0.00–0.08)

## 2015-08-27 LAB — BRAIN NATRIURETIC PEPTIDE: B Natriuretic Peptide: 328.8 pg/mL — ABNORMAL HIGH (ref 0.0–100.0)

## 2015-08-27 MED ORDER — INSULIN ASPART 100 UNIT/ML ~~LOC~~ SOLN
0.0000 [IU] | Freq: Three times a day (TID) | SUBCUTANEOUS | Status: DC
  Administered 2015-08-28: 2 [IU] via SUBCUTANEOUS
  Administered 2015-08-29 – 2015-08-30 (×5): 1 [IU] via SUBCUTANEOUS
  Administered 2015-08-31: 2 [IU] via SUBCUTANEOUS
  Administered 2015-09-01 (×2): 1 [IU] via SUBCUTANEOUS
  Administered 2015-09-03: 2 [IU] via SUBCUTANEOUS

## 2015-08-27 MED ORDER — ALLOPURINOL 100 MG PO TABS: 100.0000 mg | ORAL_TABLET | Freq: Every day | ORAL | Status: DC

## 2015-08-27 MED ORDER — APIXABAN 5 MG PO TABS
5.0000 mg | ORAL_TABLET | Freq: Two times a day (BID) | ORAL | Status: DC
  Filled 2015-08-27: qty 1

## 2015-08-27 MED ORDER — FENTANYL CITRATE (PF) 100 MCG/2ML IJ SOLN
INTRAMUSCULAR | Status: AC
  Administered 2015-08-27: 50 ug
  Filled 2015-08-27: qty 2

## 2015-08-27 MED ORDER — SODIUM CHLORIDE 0.9 % IV SOLN
1.0000 g | Freq: Once | INTRAVENOUS | Status: AC
  Administered 2015-08-27: 1 g via INTRAVENOUS
  Filled 2015-08-27: qty 10

## 2015-08-27 MED ORDER — HEPARIN (PORCINE) IN NACL 100-0.45 UNIT/ML-% IJ SOLN
1350.0000 [IU]/h | INTRAMUSCULAR | Status: DC
  Administered 2015-08-27: 1350 [IU]/h via INTRAVENOUS
  Filled 2015-08-27 (×2): qty 250

## 2015-08-27 MED ORDER — PRISMASOL BGK 0/2.5 32-2.5 MEQ/L IV SOLN
INTRAVENOUS | Status: DC
Start: 2015-08-27 — End: 2015-08-28
  Administered 2015-08-27 (×2): via INTRAVENOUS_CENTRAL
  Filled 2015-08-27 (×9): qty 5000

## 2015-08-27 MED ORDER — SODIUM CHLORIDE 0.9 % IV SOLN: 250.0000 mL | INTRAVENOUS | Status: DC | PRN

## 2015-08-27 MED ORDER — MORPHINE SULFATE (PF) 2 MG/ML IV SOLN
2.0000 mg | INTRAVENOUS | Status: DC | PRN
  Filled 2015-08-27 (×8): qty 1

## 2015-08-27 MED ORDER — DEXTROSE 50 % IV SOLN
1.0000 | Freq: Once | INTRAVENOUS | Status: AC
  Administered 2015-08-27: 50 mL via INTRAVENOUS
  Filled 2015-08-27: qty 50

## 2015-08-27 MED ORDER — DOCUSATE SODIUM 100 MG PO CAPS
100.0000 mg | ORAL_CAPSULE | Freq: Two times a day (BID) | ORAL | Status: DC
  Administered 2015-08-28 – 2015-09-03 (×9): 100 mg via ORAL
  Filled 2015-08-27 (×9): qty 1

## 2015-08-27 MED ORDER — INSULIN ASPART 100 UNIT/ML IV SOLN
10.0000 [IU] | Freq: Once | INTRAVENOUS | Status: AC
  Administered 2015-08-27: 10 [IU] via INTRAVENOUS
  Filled 2015-08-27: qty 1

## 2015-08-27 MED ORDER — ALBUTEROL SULFATE (2.5 MG/3ML) 0.083% IN NEBU: 3.0000 mL | INHALATION_SOLUTION | Freq: Four times a day (QID) | RESPIRATORY_TRACT | Status: DC | PRN

## 2015-08-27 MED ORDER — HEPARIN SODIUM (PORCINE) 5000 UNIT/ML IJ SOLN
250.0000 [IU]/h | INTRAMUSCULAR | Status: DC
  Administered 2015-08-28: 700 [IU]/h via INTRAVENOUS_CENTRAL
  Administered 2015-08-29: 750 [IU]/h via INTRAVENOUS_CENTRAL
  Administered 2015-08-29 – 2015-08-30 (×4): 1250 [IU]/h via INTRAVENOUS_CENTRAL
  Administered 2015-08-31: 500 [IU]/h via INTRAVENOUS_CENTRAL
  Filled 2015-08-27 (×10): qty 2

## 2015-08-27 MED ORDER — CETYLPYRIDINIUM CHLORIDE 0.05 % MT LIQD
7.0000 mL | Freq: Two times a day (BID) | OROMUCOSAL | Status: DC
  Administered 2015-08-27 – 2015-09-03 (×13): 7 mL via OROMUCOSAL

## 2015-08-27 MED ORDER — PRISMASOL BGK 0/2.5 32-2.5 MEQ/L IV SOLN
INTRAVENOUS | Status: DC
Start: 2015-08-27 — End: 2015-08-28
  Administered 2015-08-27: 21:00:00 via INTRAVENOUS_CENTRAL
  Filled 2015-08-27 (×3): qty 5000

## 2015-08-27 MED ORDER — SODIUM POLYSTYRENE SULFONATE 15 GM/60ML PO SUSP
30.0000 g | Freq: Once | ORAL | Status: AC
  Administered 2015-08-27: 30 g via ORAL
  Filled 2015-08-27: qty 120

## 2015-08-27 MED ORDER — OXYCODONE-ACETAMINOPHEN 5-325 MG PO TABS
1.0000 | ORAL_TABLET | ORAL | Status: DC | PRN
  Administered 2015-08-27 – 2015-09-03 (×22): 1 via ORAL
  Filled 2015-08-27 (×22): qty 1

## 2015-08-27 MED ORDER — HEPARIN BOLUS VIA INFUSION (CRRT)
1000.0000 [IU] | INTRAVENOUS | Status: DC | PRN
  Filled 2015-08-27: qty 1000

## 2015-08-27 MED ORDER — HEPARIN SODIUM (PORCINE) 1000 UNIT/ML DIALYSIS
1000.0000 [IU] | INTRAMUSCULAR | Status: DC | PRN
  Administered 2015-08-31: 2400 [IU] via INTRAVENOUS_CENTRAL
  Filled 2015-08-27: qty 3
  Filled 2015-08-27: qty 6

## 2015-08-27 MED ORDER — SODIUM CHLORIDE 0.9 % IV BOLUS (SEPSIS)
500.0000 mL | Freq: Once | INTRAVENOUS | Status: AC
  Administered 2015-08-27: 500 mL via INTRAVENOUS

## 2015-08-27 MED ORDER — MIDAZOLAM HCL 2 MG/2ML IJ SOLN
INTRAMUSCULAR | Status: AC
  Administered 2015-08-27: 2 mg
  Filled 2015-08-27: qty 2

## 2015-08-27 MED ORDER — FENTANYL CITRATE (PF) 100 MCG/2ML IJ SOLN
25.0000 ug | Freq: Once | INTRAMUSCULAR | Status: AC
  Administered 2015-08-27: 25 ug via INTRAVENOUS

## 2015-08-27 MED ORDER — SODIUM CHLORIDE 0.9 % IJ SOLN
3.0000 mL | Freq: Two times a day (BID) | INTRAMUSCULAR | Status: DC
  Administered 2015-08-27 – 2015-09-03 (×13): 3 mL via INTRAVENOUS

## 2015-08-27 MED ORDER — PRISMASOL BGK 0/2.5 32-2.5 MEQ/L IV SOLN
INTRAVENOUS | Status: DC
Start: 2015-08-27 — End: 2015-08-28
  Administered 2015-08-27: 21:00:00 via INTRAVENOUS_CENTRAL
  Filled 2015-08-27 (×4): qty 5000

## 2015-08-27 MED ORDER — MIDAZOLAM HCL 2 MG/2ML IJ SOLN
1.0000 mg | Freq: Once | INTRAMUSCULAR | Status: AC
  Administered 2015-08-27: 1 mg via INTRAVENOUS

## 2015-08-27 MED ORDER — MORPHINE SULFATE (PF) 2 MG/ML IV SOLN
2.0000 mg | INTRAVENOUS | Status: DC | PRN
  Administered 2015-08-27 – 2015-08-30 (×9): 2 mg via INTRAVENOUS
  Filled 2015-08-27 (×2): qty 1

## 2015-08-27 MED ORDER — SODIUM CHLORIDE 0.9 % FOR CRRT
INTRAVENOUS_CENTRAL | Status: DC | PRN
  Filled 2015-08-27: qty 1000

## 2015-08-27 MED ORDER — SODIUM CHLORIDE 0.9 % IJ SOLN: 3.0000 mL | INTRAMUSCULAR | Status: DC | PRN

## 2015-08-27 MED ORDER — DICLOFENAC EPOLAMINE 1.3 % TD PTCH
1.0000 | MEDICATED_PATCH | Freq: Two times a day (BID) | TRANSDERMAL | Status: DC | PRN
  Administered 2015-08-28 (×2): 1 via TRANSDERMAL
  Filled 2015-08-27 (×4): qty 1

## 2015-08-27 NOTE — Telephone Encounter (Signed)
Spoke w/pt, he reports his wt is up to 235 lb today, he usually runs 222-224.  He states he has been taking all meds as prescribed including Tor 40 mg Twice daily and has taken Metolazone 5 mg Mon and today but wt has continued to increase.  He also reports he has not had a BM today, he did go yesterday.  Pt denies and edema in legs and is unable to tell if abd is distended or not.  He does report increase SOB at rest.  Will discuss w/Dr Bensimhon and call him back

## 2015-08-27 NOTE — Telephone Encounter (Signed)
Patient called back and said that he was at [facility] to be seen there.  Instructed him to call us back after he has been seen and treated.

## 2015-08-27 NOTE — Consult Note (Signed)
Reason for Consult:AKI, fluid xs Referring Physician: Dr. McIntyre  Frank Davidson is an 64 y.o. male.  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 Claims 2 pillow orthop, but SOB with minimal exertion.  Voiding very little, and  prob getting stream started.  No CP but edema.  ? Of chills and fevers.  Shaky. Taking OTC "kidney cleanse". . Primary Nephrologist Mattingly. EDW up 30lb. Constitutional: weak, not feeling good Eyes: decreased vision Ears, nose, mouth, throat, and face: dry mouth Respiratory: as above Cardiovascular: as above Gastrointestinal: negative Genitourinary:as above Integument/breast: negative Hematologic/lymphatic: anemia Musculoskeletal:diffuse ms ache, knee pain Neurological: sleepy Endocrine: Sugar low recently Allergic/Immunologic: Corlanor, Januvia, Lunesta, Actos   Past Medical History  Diagnosis Date  . Atrial fibrillation (HCC)     PAF  . Hypertension   . COPD (chronic obstructive pulmonary disease) (HCC)   . Ocular herpes   . Gout   . CHF (congestive heart failure) (HCC)   . Chronic pain   . Hyperlipidemia   . Nonischemic cardiomyopathy (HCC) Aug 2015    EF 15%  . Ocular herpes   . Neuropathy (HCC)   . Left-sided weakness     "because of the arthritis and edema"  . PVD (peripheral vascular disease) (HCC) May 2015    abnormal ABIs  . Memory impairment     "short term"  . CKD (chronic kidney disease), stage III     Dr. Mattingly follows-holding   . Fibromyalgia     neuropathy- hands, more than feet.  . Sleep apnea with use of continuous positive airway pressure (CPAP)     does not use cpap  . GERD (gastroesophageal reflux disease)     tums as needed  . Asthma   . Type II diabetes mellitus (HCC)   . Stroke (HCC) X  5    last stroke aug 2014"; denies residual on 09/12/2014  . Osteoarthritis   . Arthritis     "knees, right elbow, shoulder" (09/12/2014)  . Shortness of breath dyspnea     Past Surgical History  Procedure Laterality Date  . 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 Tonsillectomy  age 24  . Esophagogastroduodenoscopy N/A 12/24/2013    Procedure: ESOPHAGOGASTRODUODENOSCOPY (EGD);  Surgeon: Robert D Kaplan, MD;  Location: [facility];  Service: Endoscopy;  Laterality: N/A;  . Colonoscopy N/A 12/24/2013    Procedure: COLONOSCOPY;  Surgeon: Robert D Kaplan, MD;  Location: [facility];  Service: Endoscopy;  Laterality: N/A;  . Joint replacement    . Cardiac catheterization  10/2006    normal coronary arteries;   . Cataract extraction w/ intraocular lens  implant, bilateral Bilateral ~ 2008  . Total hip arthroplasty Left 09/12/2014    Procedure: LEFT TOTAL HIP ARTHROPLASTY ANTERIOR APPROACH;  Surgeon: Matthew D Olin, MD;  Location: [facility] OR;  Service: Orthopedics;  Laterality: Left;  . Right heart catheterization N/A 08/13/2014    Rt ht cath prior to hip surgery    Family History  Problem Relation Age of Onset  . Cancer Mother   . Hypertension Mother   . Lung disease Mother   . Diabetes Father   . Diabetes Sister   . Hypertension Sister   . Hypertension Brother   . Hypertension Sister   .   Colon cancer Neg Hx     Social History:  reports that he quit smoking about 8 years ago. His smoking use included Cigarettes. He smoked 0.00 packs per day for 0 years. He has never used smokeless tobacco. He reports that he uses illicit drugs (Cocaine and Marijuana). He reports that he does not drink alcohol.  Allergies:  Allergies  Allergen Reactions  . Corlanor [Ivabradine] Palpitations and Other (See Comments)    Kidney and heart problems, syncope  . Januvia [Sitagliptin] Other (See Comments)    "Almost killed me"   . Lunesta [Eszopiclone] Palpitations and Other (See Comments)    Kidney and heart  problems, syncope  . Actos [Pioglitazone] Other (See Comments)    Caused blood in urine and fluid retention     Medications:  I have reviewed the patient's current medications. Prior to Admission:  Prescriptions prior to admission  Medication Sig Dispense Refill Last Dose  . albuterol (PROVENTIL HFA;VENTOLIN HFA) 108 (90 BASE) MCG/ACT inhaler Inhale 2 puffs into the lungs every 6 (six) hours as needed for wheezing or shortness of breath. 1 Inhaler 1 months  . allopurinol (ZYLOPRIM) 100 MG tablet Take 100 mg by mouth daily.   08/27/2015 at Unknown time  . ALPHA LIPOIC ACID PO Take 1 capsule by mouth daily.   08/26/2015 at Unknown time  . apixaban (ELIQUIS) 5 MG TABS tablet Take 1 tablet (5 mg total) by mouth 2 (two) times daily. 180 tablet 3 08/27/2015 at 0500  . Ascorbic Acid (VITAMIN C PO) Take 1 tablet by mouth daily.   08/26/2015 at Unknown time  . b complex vitamins tablet Take 1 tablet by mouth daily.   08/26/2015 at Unknown time  . baclofen (LIORESAL) 10 MG tablet TAKE 1 TABLET EVERY 8 HOURS AS NEEDED FOR MUSCLE SPASM(S) 90 tablet 1 08/26/2015 at Unknown time  . carvedilol (COREG) 25 MG tablet TAKE 1/2 TABLET TWICE DAILY  WITH  A  MEAL 90 tablet 2 08/27/2015 at 0500  . Coenzyme Q10 (COQ10) 100 MG CAPS Take 100 mg by mouth 2 (two) times daily.   08/26/2015 at Unknown time  . diclofenac (FLECTOR) 1.3 % PTCH Place 1 patch onto the skin 2 (two) times daily as needed (pain).   08/26/2015 at Unknown time  . docusate sodium 100 MG CAPS Take 100 mg by mouth 2 (two) times daily. 10 capsule 0 08/27/2015 at Unknown time  . glipiZIDE (GLUCOTROL) 5 MG tablet Take 1 tablet (5 mg total) by mouth 2 (two) times daily. 180 tablet 3 08/27/2015 at Unknown time  . HAWTHORN EXTRACT PO Take 1 capsule by mouth daily.   08/26/2015 at Unknown time  . hydrALAZINE (APRESOLINE) 50 MG tablet Take 1 tablet (50 mg total) by mouth 3 (three) times daily. 90 tablet 3 08/27/2015 at Unknown time  . isosorbide mononitrate (IMDUR) 60 MG  24 hr tablet TAKE 1 TABLET (60 MG TOTAL) BY MOUTH DAILY. 90 tablet 2 08/27/2015 at Unknown time  . metolazone (ZAROXOLYN) 5 MG tablet TAKE 1 TABLET EVERY DAY AS NEEDED FOR SWELLING 30 tablet 2 08/27/2015 at Unknown time  . omega-3 acid ethyl esters (LOVAZA) 1 G capsule Take 1 g by mouth daily.   08/26/2015 at Unknown time  . oxyCODONE-acetaminophen (PERCOCET) 10-325 MG tablet Take 1 tablet by mouth 4 (four) times daily. scheduled 120 tablet 0 08/26/2015 at Unknown time  . polyethylene glycol powder (GLYCOLAX/MIRALAX) powder MIX 1 CAPFUL (17GM)  IN  LIQUID  AND  DRINK EVERY DAY AS  DIRECTED 1530 g 3 08/27/2015 at Unknown time  . potassium chloride SA (K-DUR,KLOR-CON) 20 MEQ tablet Take 1 tablet (20 mEq total) by mouth daily. 30 tablet 6 08/27/2015 at Unknown time  . prednisoLONE acetate (PRED FORTE) 1 % ophthalmic suspension Place 1 drop into both eyes 4 (four) times daily.   08/26/2015 at Unknown time  . spironolactone (ALDACTONE) 25 MG tablet Take 0.5 tablets (12.5 mg total) by mouth daily. 90 tablet 3 08/27/2015 at Unknown time  . torsemide (DEMADEX) 20 MG tablet TAKE 2 TABLETS BY MOUTH 2 TIMES DAILY 120 tablet 4 08/27/2015 at Unknown time  . VITAMIN E PO Take 1 capsule by mouth daily.   08/27/2015 at Unknown time    Results for orders placed or performed during the hospital encounter of 08/27/15 (from the past 48 hour(s))  Basic metabolic panel     Status: Abnormal   Collection Time: 08/27/15  1:02 PM  Result Value Ref Range   Sodium 122 (L) 135 - 145 mmol/L   Potassium 6.2 (HH) 3.5 - 5.1 mmol/L    Comment: NO VISIBLE HEMOLYSIS CRITICAL RESULT CALLED TO, READ BACK BY AND VERIFIED WITH: NICOLE SIMMONS,RN AT 1403 08/27/15 BY ZBEECH.    Chloride 89 (L) 101 - 111 mmol/L   CO2 19 (L) 22 - 32 mmol/L   Glucose, Bld 72 65 - 99 mg/dL   BUN 86 (H) 6 - 20 mg/dL   Creatinine, Ser 7.14 (H) 0.61 - 1.24 mg/dL   Calcium 8.5 (L) 8.9 - 10.3 mg/dL   GFR calc non Af Amer 7 (L) >60 mL/min   GFR calc Af Amer 8 (L) >60  mL/min    Comment: (NOTE) The eGFR has been calculated using the CKD EPI equation. This calculation has not been validated in all clinical situations. eGFR's persistently <60 mL/min signify possible Chronic Kidney Disease.    Anion gap 14 5 - 15  CBC     Status: Abnormal   Collection Time: 08/27/15  1:02 PM  Result Value Ref Range   WBC 6.8 4.0 - 10.5 K/uL   RBC 3.61 (L) 4.22 - 5.81 MIL/uL   Hemoglobin 10.3 (L) 13.0 - 17.0 g/dL   HCT 30.9 (L) 39.0 - 52.0 %   MCV 85.6 78.0 - 100.0 fL   MCH 28.5 26.0 - 34.0 pg   MCHC 33.3 30.0 - 36.0 g/dL   RDW 16.8 (H) 11.5 - 15.5 %   Platelets 271 150 - 400 K/uL  Brain natriuretic peptide     Status: Abnormal   Collection Time: 08/27/15  1:02 PM  Result Value Ref Range   B Natriuretic Peptide 328.8 (H) 0.0 - 100.0 pg/mL  I-stat troponin, ED     Status: None   Collection Time: 08/27/15  1:12 PM  Result Value Ref Range   Troponin i, poc 0.04 0.00 - 0.08 ng/mL   Comment 3            Comment: Due to the release kinetics of cTnI, a negative result within the first hours of the onset of symptoms does not rule out myocardial infarction with certainty. If myocardial infarction is still suspected, repeat the test at appropriate intervals.     Dg Chest 2 View  08/27/2015   CLINICAL DATA:  Shortness of breath for 3 days  EXAM: CHEST  2 VIEW  COMPARISON:  July 18, 2015  FINDINGS: There is stable scarring in each mid lung region. There is no edema or consolidation. Heart size and pulmonary   vascularity are normal. Known adenopathy. No bone lesions.  IMPRESSION: Scarring in each mid lung region.  No edema or consolidation.   Electronically Signed   By: William  Woodruff III M.D.   On: 08/27/2015 13:30    ROS Blood pressure 102/40, pulse 59, temperature 97.3 F (36.3 C), temperature source Oral, resp. rate 17, height 5' 10" (1.778 m), weight 109.362 kg (241 lb 1.6 oz), SpO2 100 %. Physical Exam Physical Examination: General appearance - lethargic,  asterixis Mental status - as above, Ox3 Eyes - funduscopic exam abnormal DM retinopathy Mouth - mucous membranes moist, pharynx normal without lesions Neck - adenopathy noted PCL Lymphatics - posterior cervical nodes Chest - wheezing noted bilat, prolonged expir, rales noted bibasilar, decreased air entry noted bilat Heart - irregularly irregular rhythm with rate 60s, systolic murmur Gr2/6 at apex Abdomen - obese, pos bs, liver down 6 cm Neurological - lethargic, asterixis, sym facies, moves all extrem to stim Musculoskeletal - no joint tenderness, deformity or swelling Extremities - pedal edema 2-3  + Skin - trophic changes distally , cool extrem  Assessment/Plan: 1 AKI  ? Drug with AIN, vs Cardiorenal.  BP low, will culture, Acidemic, hyperkalemic, severe vol xs..  bp low, will use CRRt. R/o obstruction.   2 CKD 3-4 progressive over past 9 mon.  DM vs HTN and Cardiac exacerbating 3 Hypotension 4. Anemia will eval 5. Metabolic Bone Disease: will eval 6 NICM suspect a primary role 7 Obesity 8 DM control 9 OSA 10 Gout 11 CVA 12 Afib anticoag 13 Chronic pain P CRRT, Catheter, Culture, UA, U/S, Urine chem,  Control D  DETERDING,JAMES L 08/27/2015, 4:51 PM       

## 2015-08-27 NOTE — Progress Notes (Signed)
Pt arrived to SICU approximately 1830. When PM RN assessed pt, pt denied pain. CRRT initiated at 2100, pt asleep. Pt awakened from sleep with 8/10 Rt. Hip pain at 2220. No PRNs ordered. MD Sommer in box notified. Pt asked if this pain is different from pain experienced at home, stated no. At home he uses Flector patch, MD Sommer aware, this particular patch has renal involvement and cannot be used in this case. Percocet 5/325 ordered. Pt verbally abusive to RN because home pain meds not ordered, and "why is RN doing nothing." These comments are post 2 phone calls to MD, 7 heatpacks and 1 icepack in addition to administration of 1tab percocet 5/325. Pt made aware that RN cannot place orders and that MD was notified. Pt proceeds to tell RN that the reason his pain is being neglected is due to the fact "that he is black." Pt reassured that is not the reason. Actual time of pain start to receiving pain meds approximately 15-20 minutes. MD Kasa in box notified of continued 10/10 pain. Morphine 2mg /hr ordered. Pt writhing in bed to the point that he is about to dislodge Rt IJ trialysis cath. When RN educates pt that he could potentially dislodge catheter (also causing frequent pressure alarms on CRRT), he was upset that RN had not brought in pt's applesauce. Pt currently watching television. VVS, will cont to monitor pt.   Ashanna Heinsohn L

## 2015-08-27 NOTE — Telephone Encounter (Signed)
Called him back- I refilled this on 9/30- it is available for pick up. He reports that his weight is up 15 lbs and he is not feeling well He has called cardiology but has not heard back from them yet.  However then saw follow-up phone message on computer, it looks like he is here at [facility] to be seen

## 2015-08-27 NOTE — H&P (Signed)
Family Medicine Teaching [facility] Admission History and Physical Service Pager: [phone]  Patient name: Frank Davidson Medical record number: [id number] Date of birth: 26-Nov-1950 Age: 64 y.o. Gender: male  Primary Care Provider: Abbe Amsterdam, MD Consultants: Cardiology Code Status: full (obtained on admission)  Chief Complaint: Shortness of breath  Assessment and Plan: Frank Davidson is a 64 y.o. male presenting with shortness of breath and weight gain. PMH is significant for CHF, h/o CVA, COPD, CKD-3, DM-2, HTN, sCHF, PAF, gout, OSA and chronic anemia  CHF/Shortness of breath: likely 2/2 CHF exacerbation. Reports weight gain of 20 lbs over the last two days. Taking his torasamide scheduled and metazolame prn. Exam and imaging findings not significant for gross fluid overload however, worsening abd distension could represent area of fluid retention. Not able to access JVD due to body habitus. BNP 328 (805 two weeks ago). TTE in 11/2014 with EF of 20%. LV apical false tendon. Severe global hypokinesis. Reports wheezing as well. Report remote hx of asthma and bronchitis and used albuterol in the past. Not suspicious for for ACS without chest pain. EKG without evidence of ischemia but shows interventricular conduction delay with left axis deviation.  Troponin negative. Potential renal etiology of SOB given Cr 7.14 and concern for renal failure and subsequent volume overload -Admit with telemetry. Attending Dr. Pollie Meyer -Card consulted and saw pt in ED.  -CVP for better volume status assessment  -appreciate cards recs -Telemetry - Repeat echo - oxygen prn - Consider abdominal and renal US.   History of paroxysmal afib- on eliquis as an outpatient, currently in sinus rhythm - Continue eliquis - continue on telemetry  Hypotension: Hypotensive to 88/46. Improved to 106/46 in the ED. On coreg 12.5 bid, hydralazine 50 mg tid, torsemide 20 mg bid, spironolactone 12.5 mg daily.  Hypotension likely secondary to poor cardiac output and concern for risk of developing cardiogenic shock  -Holding of his home blood pressure meds now - continue to monitor CVP per cards recs  Hyponatremia: Na 122 (from 137 two weeks ago). Likely from volume overload. No sign of mental status change. - continue to monitor as fluid removed with diuresis  Hyperkalemia: K 6.2. EKG  shows interventricular conduction delay with left axis deviation concerning as a consequence of hyperkalemia.  Given cardiac disease there is even greater concern for risk of developing arrythmia.  S/p Calcium gluconate, Insulin + D50% and Kyexlate in ED. - Monitor Bmet - Repeat EKG as needed PRN K level and in AM - Repeat calcium gluconate, insulin as needed. Consider albuterol - Obtain Mg and Phos levels  AoCKD: sCr 7.14 (b/l about 2.7). Concern for cardiorenal syndrome versus drug related renal injury given multiple herbal diuretic used at home. Given history of reduced UOP and markedly elevated Cr will likey need HD - Renal consult, appreciate recs - Bmet daily  - Consider Renal US . DM-2: Last A1c 5.6 in 12/2014  On glimipride at home.  -SSI -A1c  COPD: not on any meds recently. No wheeze or increased work of breathing on exam. Stable -Oxygen as needed -Albuterol prn  H/o Gout - Hold home allopurinol given renal injury  Anemia: Hgb 10.3 (b/l 11-12). . - consider iron panel  FEN/GI:  -NPO pending CVP  Prophylaxis: -continue home eliquis  Disposition: After discussion with Renal and Cardiology the decision was was to admit to the ICU for continuous dialysis and monitoring of [name]'s central venous pressure after central line placement   History of Present Illness:  Frank Davidson is a 64 y.o. male presenting with shortness of breath for two weeks that has gotten worse over the last two days and weight gain of 20 lbs. PMH is significant for CVA, COPD, CKD-3, DM-2, HTN, sCHF, PAF, gout, OSA  and chronic anemia  Patient reports shortness of breath with exertion for the last two weeks that has acutely gotten worse over the last 2 days. Reports 20 lbs gained in 2 days as well despite taking his torsemide and metalozone. Also has been taking several herbal diuretic supplements.  SOB with activity. His has been able to walk without running short of breath for about two miles up until two weeks ago. Has in past been ordered for home O2 but states he " refused it and now will receive it".  Denies recent changes on his medication. Denies missing doses.   He also reports problem initiate urine when he feel like he has too. Noted he " wet himself" and had to use depends because he could not predict when he would urinate. Has also noted wheezing over the last two days. ,  He also reports not having BM as frequently as he normally does. Reports constipation over the last 1 day with last BM yesterday but felt it was incomplete empyting. Stool was soft in nature. He also reports that his blood sugars have been dropping, yesterday noted his blood sugar dropped to 67. He has been eating as usual  Denies chest pain, fever, chills but reports diffuse abd pain  In ED: received Ca gluconate, Insulin and D50% and Kayexalate. BMP significant for Na 122, K 6.2, Cl 89, Cr 7.14, BNP 328. CBC stable. UA with WBCs.  Review Of Systems: Per HPI  Patient Active Problem List   Diagnosis Date Noted  . Acute kidney injury (HCC) 08/27/2015  . Hip pain 07/23/2015  . Acute exacerbation of CHF (congestive heart failure) (HCC)   . Dyspnea   . Acute renal failure superimposed on stage 3 chronic kidney disease (HCC) 07/01/2015  . Herpes ocular   . Acute on chronic systolic heart failure (HCC) 06/19/2015  . Facial rash 06/19/2015  . Diabetes mellitus type 2, controlled (HCC) 06/19/2015  . Essential hypertension 06/19/2015  . Chronic anemia 06/19/2015  . CHF (congestive heart failure) (HCC) 06/19/2015  . CHF  exacerbation (HCC) 06/18/2015  . Non-ischemic cardiomyopathy- EF 15% echo Aug 2015 11/24/2014  . Chronic anticoagulation 11/24/2014  . S/P left THA, AA 09/12/2014  . Gout 04/29/2014  . Ocular herpes   . Neuropathy (HCC)   . OSA (obstructive sleep apnea) 07/09/2013  . Chronic systolic heart failure (HCC) 07/05/2013  . 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 PAF (paroxysmal atrial fibrillation) (HCC) 07/03/2013  . Hypertension   . COPD (chronic obstructive pulmonary disease) (HCC)   . Diabetes mellitus with nephropathy   . CKD (chronic kidney disease), stage III   . Polysubstance abuse    Past Medical History: Past Medical History  Diagnosis Date  . Atrial fibrillation (HCC)     PAF  . Hypertension   . COPD (chronic obstructive pulmonary disease) (HCC)   . Ocular herpes   . Gout   . CHF (congestive heart failure) (HCC)   . Chronic pain   . Hyperlipidemia   . Nonischemic cardiomyopathy (HCC) Aug 2015    EF 15%  . Ocular herpes   . Neuropathy (HCC)   . Left-sided weakness     "because of the arthritis and edema"  .  PVD (peripheral vascular disease) [facility]) May 2015    abnormal ABIs  . Memory impairment     "short term"  . CKD (chronic kidney disease), stage III     Dr. Briant Cedar follows-holding   . Fibromyalgia     neuropathy- hands, more than feet.  . Sleep apnea with use of continuous positive airway pressure (CPAP)     does not use cpap  . GERD (gastroesophageal reflux disease)     tums as needed  . Asthma   . Type II diabetes mellitus (HCC)   . Stroke [facility]) X 5    last stroke aug 2014"; denies residual on 09/12/2014  . Osteoarthritis   . Arthritis     "knees, right elbow, shoulder" (09/12/2014)  . Shortness of breath dyspnea    Past Surgical History: Past Surgical History  Procedure Laterality Date  . 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 Tonsillectomy  age 89  . Esophagogastroduodenoscopy N/A 12/24/2013    Procedure: ESOPHAGOGASTRODUODENOSCOPY (EGD);  Surgeon:  Louis Meckel, MD;  Location: [facility];  Service: Endoscopy;  Laterality: N/A;  . Colonoscopy N/A 12/24/2013    Procedure: COLONOSCOPY;  Surgeon: Louis Meckel, MD;  Location: [facility] END[facility]COPY;  Service: Endoscopy;  Laterality: N/A;  . Joint replacement    . Cardiac catheterization  10/2006    normal coronary arteries;   Marland Kitchen Cataract extraction w/ intraocular lens  implant, bilateral Bilateral ~ 2008  . Total hip arthroplasty Left 09/12/2014    Procedure: LEFT TOTAL HIP ARTHROPLASTY ANTERIOR APPROACH;  Surgeon: [name], MD;  Location: [facility] OR;  Service: Orthopedics;  Laterality: Left;  . Right heart catheterization N/A 08/13/2014    Rt ht cath prior to hip surgery   Social History: Social History  Substance Use Topics  . Smoking status: Former Smoker -- 0.00 packs/day for 0 years    Types: Cigarettes    Quit date: 12/18/2006  . Smokeless tobacco: Never Used  . Alcohol Use: No     Comment: Quit 2008   Additional social history: no smoking, etoh use or drug use Please also refer to relevant sections of EMR.  Family History: Family History  Problem Relation Age of Onset  . Cancer Mother   . Hypertension Mother   . Lung disease Mother   . Diabetes Father   . Diabetes Sister   . Hypertension Sister   . Hypertension Brother   . Hypertension Sister   . Colon cancer Neg Hx    Allergies and Medications: Allergies  Allergen Reactions  . Corlanor [Ivabradine] Palpitations and Other (See Comments)    Kidney and heart problems, syncope  . Januvia [Sitagliptin] Other (See Comments)    "Almost killed me"   . Lunesta [Eszopiclone] Palpitations and Other (See Comments)    Kidney and heart problems, syncope  . Actos [Pioglitazone] Other (See Comments)    Caused blood in urine and fluid retention    No current facility-administered medications on file prior to encounter.   Current Outpatient Prescriptions on File Prior to Encounter  Medication Sig Dispense Refill  . albuterol  (PROVENTIL HFA;VENTOLIN HFA) 108 (90 BASE) MCG/ACT inhaler Inhale 2 puffs into the lungs every 6 (six) hours as needed for wheezing or shortness of breath. 1 Inhaler 1  . allopurinol (ZYLOPRIM) 100 MG tablet Take 100 mg by mouth daily.    . ALPHA LIPOIC ACID PO Take 1 capsule by mouth daily.    Marland Kitchen apixaban (ELIQUIS) 5 MG TABS  tablet Take 1 tablet (5 mg total) by mouth 2 (two) times daily. 180 tablet 3  . Ascorbic Acid (VITAMIN C PO) Take 1 tablet by mouth daily.    Marland Kitchen b complex vitamins tablet Take 1 tablet by mouth daily.    . baclofen (LIORESAL) 10 MG tablet TAKE 1 TABLET EVERY 8 HOURS AS NEEDED FOR MUSCLE SPASM(S) 90 tablet 1  . carvedilol (COREG) 25 MG tablet TAKE 1/2 TABLET TWICE DAILY  WITH  A  MEAL 90 tablet 2  . Coenzyme Q10 (COQ10) 100 MG CAPS Take 100 mg by mouth 2 (two) times daily.    . diclofenac (FLECTOR) 1.3 % PTCH Place 1 patch onto the skin 2 (two) times daily as needed (pain).    Marland Kitchen docusate sodium 100 MG CAPS Take 100 mg by mouth 2 (two) times daily. 10 capsule 0  . glipiZIDE (GLUCOTROL) 5 MG tablet Take 1 tablet (5 mg total) by mouth 2 (two) times daily. 180 tablet 3  . HAWTHORN EXTRACT PO Take 1 capsule by mouth daily.    . hydrALAZINE (APRESOLINE) 50 MG tablet Take 1 tablet (50 mg total) by mouth 3 (three) times daily. 90 tablet 3  . isosorbide mononitrate (IMDUR) 60 MG 24 hr tablet TAKE 1 TABLET (60 MG TOTAL) BY MOUTH DAILY. 90 tablet 2  . metolazone (ZAROXOLYN) 5 MG tablet TAKE 1 TABLET EVERY DAY AS NEEDED FOR SWELLING 30 tablet 2  . omega-3 acid ethyl esters (LOVAZA) 1 G capsule Take 1 g by mouth daily.    Marland Kitchen oxyCODONE-acetaminophen (PERCOCET) 10-325 MG tablet Take 1 tablet by mouth 4 (four) times daily. scheduled 120 tablet 0  . polyethylene glycol powder (GLYCOLAX/MIRALAX) powder MIX 1 CAPFUL (17GM)  IN  LIQUID  AND  DRINK EVERY DAY AS DIRECTED 1530 g 3  . potassium chloride SA (K-DUR,KLOR-CON) 20 MEQ tablet Take 1 tablet (20 mEq total) by mouth daily. 30 tablet 6  .  prednisoLONE acetate (PRED FORTE) 1 % ophthalmic suspension Place 1 drop into both eyes 4 (four) times daily.    Marland Kitchen spironolactone (ALDACTONE) 25 MG tablet Take 0.5 tablets (12.5 mg total) by mouth daily. 90 tablet 3  . torsemide (DEMADEX) 20 MG tablet TAKE 2 TABLETS BY MOUTH 2 TIMES DAILY 120 tablet 4  . VITAMIN E PO Take 1 capsule by mouth daily.      Objective: BP 104/60 mmHg  Pulse 65  Temp(Src) 97.4 F (36.3 C) (Oral)  Resp 12  Ht  (1.778 m)  Wt 241 lb 1.6 oz (109.362 kg)  BMI 34.59 kg/m2  SpO2 100% Exam: Gen: sitting propped up in bed with nasal canula in place but 0L oxygen, well appearing  Eyes: PERRLA, sclera anicteric, no conjunctival injection Neck: no JVD noted. It is also hard to assess given his short neck Nares: clear, no erythema, swelling or congestion Oropharynx: clear, moist CV: RRR. S1 & S2 audible, no murmurs Resp: poor air movement bilaterally, no wheeze or crackles. Exam limited secondary to body habitus Abd: +BS. tense and diffusely mildly tender, no rebound or guarding. No fluid wave  Ext: 1+ edema bilaterally. This is after taking of his compression stock Neuro: Alert and oriented, No gross focal deficits Psych: Normal mood and affect  Labs and Imaging: CBC BMET   Recent Labs Lab 08/27/15 1302  WBC 6.8  HGB 10.3*  HCT 30.9*  PLT 271    Recent Labs Lab 08/27/15 1302  NA 122*  K 6.2*  CL 89*  CO2  19*  BUN 86*  CREATININE 7.14*  GLUCOSE 72  CALCIUM 8.5*     .Dg Chest 2 View  08/27/2015   CLINICAL DATA:  Shortness of breath for 3 days  EXAM: CHEST  2 VIEW  COMPARISON:  July 18, 2015  FINDINGS: There is stable scarring in each mid lung region. There is no edema or consolidation. Heart size and pulmonary vascularity are normal. Known adenopathy. No bone lesions.  IMPRESSION: Scarring in each mid lung region.  No edema or consolidation.   Electronically Signed   By: Bretta Bang III M.D.   On: 08/27/2015 13:30    Almon Hercules,  MD 08/27/2015, 3:00 PM PGY-1, [facility] FPTS Intern pager: [phone], text pages welcome  I have seen and examined the patient. I have read and agree with the above note. My changes are noted in blue.  Kalayah Leske A. Kennon Rounds MD, MS Family Medicine Resident PGY-2 Pager [phone]

## 2015-08-27 NOTE — Progress Notes (Signed)
ANTICOAGULATION CONSULT NOTE - Initial Consult  Pharmacy Consult for Heparin Indication: atrial fibrillation with h/o CVA  Allergies  Allergen Reactions  . Corlanor [Ivabradine] Palpitations and Other (See Comments)    Kidney and heart problems, syncope  . Januvia [Sitagliptin] Other (See Comments)    "Almost killed me"   . Lunesta [Eszopiclone] Palpitations and Other (See Comments)    Kidney and heart problems, syncope  . Actos [Pioglitazone] Other (See Comments)    Caused blood in urine and fluid retention     Patient Measurements: Height: 5\' 10"  (177.8 cm) Weight: 241 lb 1.6 oz (109.362 kg) IBW/kg (Calculated) : 73 Heparin Dosing Weight: 96.5 kg  Vital Signs: Temp: 97.3 F (36.3 C) (10/05 1620) Temp Source: Oral (10/05 1620) BP: 102/40 mmHg (10/05 1620) Pulse Rate: 59 (10/05 1620)  Labs:  Recent Labs  08/27/15 1302  HGB 10.3*  HCT 30.9*  PLT 271  CREATININE 7.14*    Estimated Creatinine Clearance: 12.8 mL/min (by C-G formula based on Cr of 7.14).   Medical History: Past Medical History  Diagnosis Date  . Atrial fibrillation (HCC)     PAF  . Hypertension   . COPD (chronic obstructive pulmonary disease) (HCC)   . Ocular herpes   . Gout   . CHF (congestive heart failure) (HCC)   . Chronic pain   . Hyperlipidemia   . Nonischemic cardiomyopathy (HCC) Aug 2015    EF 15%  . Ocular herpes   . Neuropathy (HCC)   . Left-sided weakness     "because of the arthritis and edema"  . PVD (peripheral vascular disease) [facility]) May 2015    abnormal ABIs  . Memory impairment     "short term"  . CKD (chronic kidney disease), stage III     Dr. Briant Cedar follows-holding   . Fibromyalgia     neuropathy- hands, more than feet.  . Sleep apnea with use of continuous positive airway pressure (CPAP)     does not use cpap  . GERD (gastroesophageal reflux disease)     tums as needed  . Asthma   . Type II diabetes mellitus (HCC)   . Stroke [facility]) X 5    last stroke aug  2014"; denies residual on 09/12/2014  . Osteoarthritis   . Arthritis     "knees, right elbow, shoulder" (09/12/2014)  . Shortness of breath dyspnea     Medications:  Prescriptions prior to admission  Medication Sig Dispense Refill Last Dose  . albuterol (PROVENTIL HFA;VENTOLIN HFA) 108 (90 BASE) MCG/ACT inhaler Inhale 2 puffs into the lungs every 6 (six) hours as needed for wheezing or shortness of breath. 1 Inhaler 1 months  . allopurinol (ZYLOPRIM) 100 MG tablet Take 100 mg by mouth daily.   08/27/2015 at Unknown time  . ALPHA LIPOIC ACID PO Take 1 capsule by mouth daily.   08/26/2015 at Unknown time  . apixaban (ELIQUIS) 5 MG TABS tablet Take 1 tablet (5 mg total) by mouth 2 (two) times daily. 180 tablet 3 08/27/2015 at 0500  . Ascorbic Acid (VITAMIN C PO) Take 1 tablet by mouth daily.   08/26/2015 at Unknown time  . b complex vitamins tablet Take 1 tablet by mouth daily.   08/26/2015 at Unknown time  . baclofen (LIORESAL) 10 MG tablet TAKE 1 TABLET EVERY 8 HOURS AS NEEDED FOR MUSCLE SPASM(S) 90 tablet 1 08/26/2015 at Unknown time  . carvedilol (COREG) 25 MG tablet TAKE 1/2 TABLET TWICE DAILY  WITH  A  MEAL  90 tablet 2 08/27/2015 at 0500  . Coenzyme Q10 (COQ10) 100 MG CAPS Take 100 mg by mouth 2 (two) times daily.   08/26/2015 at Unknown time  . diclofenac (FLECTOR) 1.3 % PTCH Place 1 patch onto the skin 2 (two) times daily as needed (pain).   08/26/2015 at Unknown time  . docusate sodium 100 MG CAPS Take 100 mg by mouth 2 (two) times daily. 10 capsule 0 08/27/2015 at Unknown time  . glipiZIDE (GLUCOTROL) 5 MG tablet Take 1 tablet (5 mg total) by mouth 2 (two) times daily. 180 tablet 3 08/27/2015 at Unknown time  . HAWTHORN EXTRACT PO Take 1 capsule by mouth daily.   08/26/2015 at Unknown time  . hydrALAZINE (APRESOLINE) 50 MG tablet Take 1 tablet (50 mg total) by mouth 3 (three) times daily. 90 tablet 3 08/27/2015 at Unknown time  . isosorbide mononitrate (IMDUR) 60 MG 24 hr tablet TAKE 1 TABLET (60  MG TOTAL) BY MOUTH DAILY. 90 tablet 2 08/27/2015 at Unknown time  . metolazone (ZAROXOLYN) 5 MG tablet TAKE 1 TABLET EVERY DAY AS NEEDED FOR SWELLING 30 tablet 2 08/27/2015 at Unknown time  . omega-3 acid ethyl esters (LOVAZA) 1 G capsule Take 1 g by mouth daily.   08/26/2015 at Unknown time  . oxyCODONE-acetaminophen (PERCOCET) 10-325 MG tablet Take 1 tablet by mouth 4 (four) times daily. scheduled 120 tablet 0 08/26/2015 at Unknown time  . polyethylene glycol powder (GLYCOLAX/MIRALAX) powder MIX 1 CAPFUL (17GM)  IN  LIQUID  AND  DRINK EVERY DAY AS DIRECTED 1530 g 3 08/27/2015 at Unknown time  . potassium chloride SA (K-DUR,KLOR-CON) 20 MEQ tablet Take 1 tablet (20 mEq total) by mouth daily. 30 tablet 6 08/27/2015 at Unknown time  . prednisoLONE acetate (PRED FORTE) 1 % ophthalmic suspension Place 1 drop into both eyes 4 (four) times daily.   08/26/2015 at Unknown time  . spironolactone (ALDACTONE) 25 MG tablet Take 0.5 tablets (12.5 mg total) by mouth daily. 90 tablet 3 08/27/2015 at Unknown time  . torsemide (DEMADEX) 20 MG tablet TAKE 2 TABLETS BY MOUTH 2 TIMES DAILY 120 tablet 4 08/27/2015 at Unknown time  . VITAMIN E PO Take 1 capsule by mouth daily.   08/27/2015 at Unknown time    Assessment: SOB, weight gain  Last seen 9/16 in CHF clinic. At that time he was not volume overloaded and continued on torsemide 40 mg bid. Weight was 226 pounds. . 2 weeks ago he started taking an herbal diuretic as well as his torsemide. Over the last few days his weight has been trending up from 222 to 242 pounds. Says he took metolazone on Monday and Tuesday with poor urine output. Progressive dyspnea with exertion.   Today 10/5 he went to the Urgent Care with increased dyspnea. Says he was hypotensive so EMS called to transport to [facility] ED.  Complains of dyspnea at rest and with exertion. Orthopnea. Poor appetite. Increased leg edema. Has been hypotensive. SBP 80s.  PMH: obesity, PAF, HTN, COPD, HTN, gout, DM 2, CVA and  CHF ECHO  11/26/2014 EF 25% due to NICM. Most recent cath 2007 with normal cors.  Labs: Pertinent admission labs include: K 6.2, Creatinine 7.1, Na 122, Hgb 10.3, Troponin 0.04, BNP 328  Anticoagulation: Eliquis PTA for h/o PAF and CVA. Scr is up to 7.1. Change to IV heparin now for starting CVVHD.   Renal: CKD III. Starting renal replacement therapy for Scr 7.1 and elevated K+, decreased UOP  Goal of  Therapy:  Heparin level 0.3-0.7 units/ml Monitor platelets by anticoagulation protocol: Yes   Plan:  D/c Eliquis (Last dose 10/5 AM)  Note heparin use with CVVHD  When next dose of Eliquis would be due (this PM, and after dialysis catheter placed), start IV heparin at 1350 units/hr  Check baseline aPTT and heparin level.  Daily aPTT and heparin level (may be elevated due to interaction with DOAC)  Heparin levels only when DOAC levels decline and heparin levels closer to normal therapeutic range.    Orion Mole S. Merilynn Finland, PharmD, BCPS Clinical Staff Pharmacist Pager [phone]  Misty Stanley Stillinger 08/27/2015,5:07 PM

## 2015-08-27 NOTE — ED Notes (Signed)
Bladder scan showed 47ml at highest of 4 readings

## 2015-08-27 NOTE — Progress Notes (Signed)
Pt arrived to [address], upon arrival pt was noted to be dyspneic with accessary muscles usage and audible wheezing, other VSS. Admitting MD at bedside. Per patient he states he has an abscess on his left buttocks. No visible abscess noted, md made aware.

## 2015-08-27 NOTE — Consult Note (Signed)
Advanced Heart Failure Team Consult Note  Referring Physician: Dr Pollie Meyer  Primary Physician: Primary HF Cardiologist:  Dr Gala Romney Nephrologist: Dr Briant Cedar  GI: Dr Yves Dill  Reason for Consultation: Heart Failure   HPI:   Mr. Marks is a 64 y.o. male with a hx of obesity, PAF, HTN, COPD, HTN, gout, DM 2, CVA and CHF ECHO  11/26/2014 EF 25% due to NICM. Most recent cath 2007 with normal cors.  Intolerant Entresto at the lowest dose due to syncope.   He has been followed closely in the HF clinic and was last seen 9/16. At that time he was not volume overloaded and continued on torsemide 40 mg bid. Weight was 226 pounds.   2 weeks ago he started taking an herbal diuretic as well as his torsemide. Over the last few days his weight has been trending up from 222 to 242 pounds. Says he took metolazone on Monday and Tuesday with poor urine output. Progressive dyspnea with exertion. Was able to walk a mile a few weeks ago but now struggling to walk in his house.   Today he went to the Urgent Care with increased dyspnea. Says he was hypotensive so EMS called to transport to [facility] ED.  Complains of dyspnea at rest and with exertion. Orthopnea. Poor appetite. Increased leg edema. Has been hypotensive. SBP 80s.    Pertinent admission labs include: K 6.2, Creatinine 7.1, Na 122, Hgb 10.3, Troponin 0.04, BNP 328  SH: Retired Engineer, site 2011 Lives with his niece  FH: Father DM Mom had lung cancer  Review of Systems: [y] = yes, [ ]  = no   General: Weight gain [Y ]; Weight loss [ ] ; Anorexia [ ] ; Fatigue [Y ]; Fever [ ] ; Chills [ ] ; Weakness [Y ]  Cardiac: Chest pain/pressure [ ] ; Resting SOB [ Y]; Exertional SOB [Y ]; Orthopnea [Y ]; Pedal Edema [ Y]; Palpitations [ ] ; Syncope [ ] ; Presyncope [ ] ; Paroxysmal nocturnal dyspnea[ ]   Pulmonary: Cough [ Y]; Wheezing[ ] ; Hemoptysis[ ] ; Sputum [ ] ; Snoring [ ]   GI: Vomiting[ ] ; Dysphagia[ ] ; Melena[ ] ; Hematochezia [ ] ; Heartburn[ ] ; Abdominal pain [ ] ;  Constipation [ ] ; Diarrhea [ ] ; BRBPR [ ]   GU: Hematuria[ ] ; Dysuria [ ] ; Nocturia[ ]   Vascular: Pain in legs with walking [ ] ; Pain in feet with lying flat [ ] ; Non-healing sores [ ] ; Stroke [ ] ; TIA [ ] ; Slurred speech [ ] ;  Neuro: Headaches[ ] ; Vertigo[ ] ; Seizures[ ] ; Paresthesias[ ] ;Blurred vision [ ] ; Diplopia [ ] ; Vision changes [ ]   Ortho/Skin: Arthritis [ ] ; Joint pain [ ] ; Muscle pain [ ] ; Joint swelling [ ] ; Back Pain [ ] ; Rash [ ]   Psych: Depression[ ] ; Anxiety[ ]   Heme: Bleeding problems [ ] ; Clotting disorders [ ] ; Anemia [ ]   Endocrine: Diabetes [Y; Thyroid dysfunction[ ]   Home Medications Prior to Admission medications   Medication Sig Start Date End Date Taking? Authorizing Provider  albuterol (PROVENTIL HFA;VENTOLIN HFA) 108 (90 BASE) MCG/ACT inhaler Inhale 2 puffs into the lungs every 6 (six) hours as needed for wheezing or shortness of breath. 06/30/15  Yes Gwenlyn Found Copland, MD  allopurinol (ZYLOPRIM) 100 MG tablet Take 100 mg by mouth daily.   Yes Historical Provider, MD  ALPHA LIPOIC ACID PO Take 1 capsule by mouth daily.   Yes Historical Provider, MD  apixaban (ELIQUIS) 5 MG TABS tablet Take 1 tablet (5 mg total) by mouth 2 (two) times daily. 02/04/15  Yes  Dolores Patty, MD  Ascorbic Acid (VITAMIN C PO) Take 1 tablet by mouth daily.   Yes Historical Provider, MD  b complex vitamins tablet Take 1 tablet by mouth daily.   Yes Historical Provider, MD  baclofen (LIORESAL) 10 MG tablet TAKE 1 TABLET EVERY 8 HOURS AS NEEDED FOR MUSCLE SPASM(S) 07/23/15  Yes Gwenlyn Found Copland, MD  carvedilol (COREG) 25 MG tablet TAKE 1/2 TABLET TWICE DAILY  WITH  A  MEAL 04/17/15  Yes Dolores Patty, MD  Coenzyme Q10 (COQ10) 100 MG CAPS Take 100 mg by mouth 2 (two) times daily.   Yes Historical Provider, MD  diclofenac (FLECTOR) 1.3 % PTCH Place 1 patch onto the skin 2 (two) times daily as needed (pain).   Yes Historical Provider, MD  docusate sodium 100 MG CAPS Take 100 mg by mouth 2 (two)  times daily. 09/16/14  Yes Matthew Babish, PA-C  glipiZIDE (GLUCOTROL) 5 MG tablet Take 1 tablet (5 mg total) by mouth 2 (two) times daily. 12/20/14  Yes Gwenlyn Found Copland, MD  HAWTHORN EXTRACT PO Take 1 capsule by mouth daily.   Yes Historical Provider, MD  hydrALAZINE (APRESOLINE) 50 MG tablet Take 1 tablet (50 mg total) by mouth 3 (three) times daily. 07/11/15  Yes Laurey Morale, MD  isosorbide mononitrate (IMDUR) 60 MG 24 hr tablet TAKE 1 TABLET (60 MG TOTAL) BY MOUTH DAILY. 01/03/15  Yes Dolores Patty, MD  metolazone (ZAROXOLYN) 5 MG tablet TAKE 1 TABLET EVERY DAY AS NEEDED FOR SWELLING 08/04/15  Yes Dolores Patty, MD  omega-3 acid ethyl esters (LOVAZA) 1 G capsule Take 1 g by mouth daily.   Yes Historical Provider, MD  oxyCODONE-acetaminophen (PERCOCET) 10-325 MG tablet Take 1 tablet by mouth 4 (four) times daily. scheduled 08/22/15  Yes Jessica C Copland, MD  polyethylene glycol powder (GLYCOLAX/MIRALAX) powder MIX 1 CAPFUL (17GM)  IN  LIQUID  AND  DRINK EVERY DAY AS DIRECTED 07/31/15  Yes Jessica C Copland, MD  potassium chloride SA (K-DUR,KLOR-CON) 20 MEQ tablet Take 1 tablet (20 mEq total) by mouth daily. 08/08/15  Yes Dolores Patty, MD  prednisoLONE acetate (PRED FORTE) 1 % ophthalmic suspension Place 1 drop into both eyes 4 (four) times daily.   Yes Historical Provider, MD  spironolactone (ALDACTONE) 25 MG tablet Take 0.5 tablets (12.5 mg total) by mouth daily. 07/04/15  Yes Asiyah Mayra Reel, MD  torsemide (DEMADEX) 20 MG tablet TAKE 2 TABLETS BY MOUTH 2 TIMES DAILY 08/11/15  Yes Gwenlyn Found Copland, MD  VITAMIN E PO Take 1 capsule by mouth daily.   Yes Historical Provider, MD    Past Medical History: Past Medical History  Diagnosis Date  . Atrial fibrillation (HCC)     PAF  . Hypertension   . COPD (chronic obstructive pulmonary disease) (HCC)   . Ocular herpes   . Gout   . CHF (congestive heart failure) (HCC)   . Chronic pain   . Hyperlipidemia   . Nonischemic  cardiomyopathy (HCC) Aug 2015    EF 15%  . Ocular herpes   . Neuropathy (HCC)   . Left-sided weakness     "because of the arthritis and edema"  . PVD (peripheral vascular disease) [facility]) May 2015    abnormal ABIs  . Memory impairment     "short term"  . CKD (chronic kidney disease), stage III     Dr. Briant Cedar follows-holding   . Fibromyalgia     neuropathy- hands, more than feet.  Marland Kitchen  Sleep apnea with use of continuous positive airway pressure (CPAP)     does not use cpap  . GERD (gastroesophageal reflux disease)     tums as needed  . Asthma   . Type II diabetes mellitus (HCC)   . Stroke [facility]) X 5    last stroke aug 2014"; denies residual on 09/12/2014  . Osteoarthritis   . Arthritis     "knees, right elbow, shoulder" (09/12/2014)  . Shortness of breath dyspnea     Past Surgical History: Past Surgical History  Procedure Laterality Date  . 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 Tonsillectomy  age 44  . Esophagogastroduodenoscopy N/A 12/24/2013    Procedure: ESOPHAGOGASTRODUODENOSCOPY (EGD);  Surgeon: Louis Meckel, MD;  Location: [facility];  Service: Endoscopy;  Laterality: N/A;  . Colonoscopy N/A 12/24/2013    Procedure: COLONOSCOPY;  Surgeon: Louis Meckel, MD;  Location: [facility] ENDOSCOPY;  Service: Endoscopy;  Laterality: N/A;  . Joint replacement    . Cardiac catheterization  10/2006    normal coronary arteries;   Marland Kitchen Cataract extraction w/ intraocular lens  implant, bilateral Bilateral ~ 2008  . Total hip arthroplasty Left 09/12/2014    Procedure: LEFT TOTAL HIP ARTHROPLASTY ANTERIOR APPROACH;  Surgeon: [name], MD;  Location: [facility] OR;  Service: Orthopedics;  Laterality: Left;  . Right heart catheterization N/A 08/13/2014    Rt ht cath prior to hip surgery    Family History: Family History  Problem Relation Age of Onset  . Cancer Mother   . Hypertension Mother   . Lung disease Mother   . Diabetes Father   . Diabetes Sister   . Hypertension Sister   .  Hypertension Brother   . Hypertension Sister   . Colon cancer Neg Hx     Social History: Social History   Social History  . Marital Status: Divorced    Spouse Name: N/A  . Number of Children: N/A  . Years of Education: N/A   Social History Main Topics  . Smoking status: Former Smoker -- 0.00 packs/day for 0 years    Types: Cigarettes    Quit date: 12/18/2006  . Smokeless tobacco: Never Used  . Alcohol Use: No     Comment: Quit 2008  . Drug Use: Yes    Special: Cocaine, Marijuana     Comment: last used marijuana and cocaine 2008  . Sexual Activity: Not Asked   Other Topics Concern  . None   Social History Narrative    Allergies:  Allergies  Allergen Reactions  . Corlanor [Ivabradine] Palpitations and Other (See Comments)    Kidney and heart problems, syncope  . Januvia [Sitagliptin] Other (See Comments)    "Almost killed me"   . Lunesta [Eszopiclone] Palpitations and Other (See Comments)    Kidney and heart problems, syncope  . Actos [Pioglitazone] Other (See Comments)    Caused blood in urine and fluid retention     Objective:    Vital Signs:   Temp:  [97.4 F (36.3 C)] 97.4 F (36.3 C) (10/05 1237) Pulse Rate:  [57-67] 65 (10/05 1430) Resp:  [8-16] 12 (10/05 1430) BP: (88-116)/(46-73) 104/60 mmHg (10/05 1430) SpO2:  [98 %-100 %] 100 % (10/05 1430) Weight:  [241 lb 1.6 oz (109.362 kg)] 241 lb 1.6 oz (109.362 kg) (10/05 1237)    Weight change: Filed Weights   08/27/15 1237  Weight: 241 lb 1.6 oz (109.362 kg)    Intake/Output:  No intake  or output data in the 24 hours ending 08/27/15 1457   Physical Exam: General:  Dyspneic at rest.  HEENT: normal Neck: supple. JVP difficult to assess due to thick neck. Appears up  Carotids 2+ bilat; no bruits. No lymphadenopathy or thryomegaly appreciated. Cor: PMI nonpalpable. Regular rate & rhythm. No rubs, gallops or murmurs. Lungs: clear Abdomen: obese, soft, nontender, ++distended. No hepatosplenomegaly. No  bruits or masses. Good bowel sounds. Extremities: no cyanosis, clubbing, rash, R and LLE 2+ edema Neuro: alert & orientedx3, cranial nerves grossly intact. moves all 4 extremities w/o difficulty. Affect pleasant  Telemetry: Sinus Brady   Labs: Basic Metabolic Panel:  Recent Labs Lab 08/27/15 1302  NA 122*  K 6.2*  CL 89*  CO2 19*  GLUCOSE 72  BUN 86*  CREATININE 7.14*  CALCIUM 8.5*    Liver Function Tests: No results for input(s): AST, ALT, ALKPHOS, BILITOT, PROT, ALBUMIN in the last 168 hours. No results for input(s): LIPASE, AMYLASE in the last 168 hours. No results for input(s): AMMONIA in the last 168 hours.  CBC:  Recent Labs Lab 08/27/15 1302  WBC 6.8  HGB 10.3*  HCT 30.9*  MCV 85.6  PLT 271    Cardiac Enzymes: No results for input(s): CKTOTAL, CKMB, CKMBINDEX, TROPONINI in the last 168 hours.  BNP: BNP (last 3 results)  Recent Labs  07/18/15 1156 08/08/15 1115 08/27/15 1302  BNP 523.3* 805.4* 328.8*    ProBNP (last 3 results)  Recent Labs  10/19/14 2158 10/25/14 1048  PROBNP 1637.0* 3555.0*     CBG: No results for input(s): GLUCAP in the last 168 hours.  Coagulation Studies: No results for input(s): LABPROT, INR in the last 72 hours.  Other results: EKG: Sinus Brady 1st degree heart block 59 bpm Imaging: Dg Chest 2 View  08/27/2015   CLINICAL DATA:  Shortness of breath for 3 days  EXAM: CHEST  2 VIEW  COMPARISON:  July 18, 2015  FINDINGS: There is stable scarring in each mid lung region. There is no edema or consolidation. Heart size and pulmonary vascularity are normal. Known adenopathy. No bone lesions.  IMPRESSION: Scarring in each mid lung region.  No edema or consolidation.   Electronically Signed   By: Bretta Bang III M.D.   On: 08/27/2015 13:30      Medications:     Current Medications: . dextrose  1 ampule Intravenous Once  . insulin aspart  10 Units Intravenous Once  . sodium polystyrene  30 g Oral Once      Infusions: . calcium gluconate 1 GM IV    . sodium chloride        Assessment:   1. A/C Systolic Heart Failure due to NICM EF 25% 2. Cardiorenal syndrome 3. Hyperkalemia- K on admit 6.2  4. AKI/CKD stage IV-V - Baseline creatinine 2.4-2.8 -- Admit creatinine 7.1 5. PAF 6. Obesity  7. DM     Plan/Discussion:   Mr Merryfield is a 64 year old well known to the HF team being admitted with AKI on CKD likely in the setting of herbal diuretics as well as prescribed diuretics. Unfortunately this may represent low output with volume overload but hard to know due to body habitus so he will need central access for mixed venous sat and CVP. Based on results +/- inotropes.   Currently hyperkalemia is being treated in ED-- Kayexylate, D50, insulin.  Neurologically intact. If no improvement will need nephrology input.   Hold off on bb/imdur/hydralazine for now.  The HF team will follow with you on stepdown.    Length of Stay: 0  CLEGG,AMY NP-C  08/27/2015, 2:57 PM  Advanced Heart Failure Team Pager [phone] (M-F; 7a - 4p)  Please contact [facility] for night-coverage after hours (4p -7a ) and weekends on [email or web address]  Patient seen and examined with Tonye Becket, NP. We discussed all aspects of the encounter. I agree with the assessment and plan as stated above.   I suspect primary issue is low output HF with worsening of his renal function. Currently appears volume overloaded. Will speak with renal but will likely need CVVHD though may be worth a trial of inotropic support first to see if this is sufficient to improve renal perfusion.   We will follow.   Raesha Coonrod,MD 4:32 PM

## 2015-08-27 NOTE — ED Provider Notes (Signed)
CSN: [id number]     Arrival date & time 08/27/15  1227 History   First MD Initiated Contact with Patient 08/27/15 1227     Chief Complaint  Patient presents with  . Shortness of Breath     (Consider location/radiation/quality/duration/timing/severity/associated sxs/prior Treatment) Patient is a 64 y.o. male presenting with shortness of breath.  Shortness of Breath Severity:  Moderate Onset quality:  Gradual Duration:  2 days Timing:  Constant Progression:  Worsening Chronicity:  Recurrent Context comment:  CHF (EF 20%), up 20 # Relieved by:  Nothing Worsened by:  Exertion Associated symptoms: abdominal pain (diffuse) and cough   Associated symptoms: no fever     Past Medical History  Diagnosis Date  . Atrial fibrillation (HCC)     PAF  . Hypertension   . COPD (chronic obstructive pulmonary disease) (HCC)   . Ocular herpes   . Gout   . CHF (congestive heart failure) (HCC)   . Chronic pain   . Hyperlipidemia   . Nonischemic cardiomyopathy (HCC) Aug 2015    EF 15%  . Ocular herpes   . Neuropathy (HCC)   . Left-sided weakness     "because of the arthritis and edema"  . PVD (peripheral vascular disease) [facility]) May 2015    abnormal ABIs  . Memory impairment     "short term"  . CKD (chronic kidney disease), stage III     Dr. Briant Cedar follows-holding   . Fibromyalgia     neuropathy- hands, more than feet.  . Sleep apnea with use of continuous positive airway pressure (CPAP)     does not use cpap  . GERD (gastroesophageal reflux disease)     tums as needed  . Asthma   . Type II diabetes mellitus (HCC)   . Stroke [facility]) X 5    last stroke aug 2014"; denies residual on 09/12/2014  . Osteoarthritis   . Arthritis     "knees, right elbow, shoulder" (09/12/2014)  . Shortness of breath dyspnea    Past Surgical History  Procedure Laterality Date  . 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 Tonsillectomy  age 19  . Esophagogastroduodenoscopy N/A 12/24/2013    Procedure:  ESOPHAGOGASTRODUODENOSCOPY (EGD);  Surgeon: Louis Meckel, MD;  Location: [facility];  Service: Endoscopy;  Laterality: N/A;  . Colonoscopy N/A 12/24/2013    Procedure: COLONOSCOPY;  Surgeon: Louis Meckel, MD;  Location: [facility] ENDOSCOPY;  Service: Endoscopy;  Laterality: N/A;  . Joint replacement    . Cardiac catheterization  10/2006    normal coronary arteries;   Marland Kitchen Cataract extraction w/ intraocular lens  implant, bilateral Bilateral ~ 2008  . Total hip arthroplasty Left 09/12/2014    Procedure: LEFT TOTAL HIP ARTHROPLASTY ANTERIOR APPROACH;  Surgeon: [name], MD;  Location: [facility] OR;  Service: Orthopedics;  Laterality: Left;  . Right heart catheterization N/A 08/13/2014    Rt ht cath prior to hip surgery   Family History  Problem Relation Age of Onset  . Cancer Mother   . Hypertension Mother   . Lung disease Mother   . Diabetes Father   . Diabetes Sister   . Hypertension Sister   . Hypertension Brother   . Hypertension Sister   . Colon cancer Neg Hx    Social History  Substance Use Topics  . Smoking status: Former Smoker -- 0.00 packs/day for 0 years    Types: Cigarettes    Quit date: 12/18/2006  . Smokeless tobacco: Never  Used  . Alcohol Use: No     Comment: Quit 2008    Review of Systems  Constitutional: Negative for fever.  Respiratory: Positive for cough and shortness of breath.   Gastrointestinal: Positive for abdominal pain (diffuse).  All other systems reviewed and are negative.     Allergies  Corlanor; Januvia; Lunesta; and Actos  Home Medications   Prior to Admission medications   Medication Sig Start Date End Date Taking? Authorizing Provider  albuterol (PROVENTIL HFA;VENTOLIN HFA) 108 (90 BASE) MCG/ACT inhaler Inhale 2 puffs into the lungs every 6 (six) hours as needed for wheezing or shortness of breath. 06/30/15  Yes Gwenlyn Found Copland, MD  allopurinol (ZYLOPRIM) 100 MG tablet Take 100 mg by mouth daily.   Yes Historical Provider, MD  ALPHA LIPOIC  ACID PO Take 1 capsule by mouth daily.   Yes Historical Provider, MD  apixaban (ELIQUIS) 5 MG TABS tablet Take 1 tablet (5 mg total) by mouth 2 (two) times daily. 02/04/15  Yes Dolores Patty, MD  Ascorbic Acid (VITAMIN C PO) Take 1 tablet by mouth daily.   Yes Historical Provider, MD  b complex vitamins tablet Take 1 tablet by mouth daily.   Yes Historical Provider, MD  baclofen (LIORESAL) 10 MG tablet TAKE 1 TABLET EVERY 8 HOURS AS NEEDED FOR MUSCLE SPASM(S) 07/23/15  Yes Gwenlyn Found Copland, MD  carvedilol (COREG) 25 MG tablet TAKE 1/2 TABLET TWICE DAILY  WITH  A  MEAL 04/17/15  Yes Dolores Patty, MD  Coenzyme Q10 (COQ10) 100 MG CAPS Take 100 mg by mouth 2 (two) times daily.   Yes Historical Provider, MD  diclofenac (FLECTOR) 1.3 % PTCH Place 1 patch onto the skin 2 (two) times daily as needed (pain).   Yes Historical Provider, MD  docusate sodium 100 MG CAPS Take 100 mg by mouth 2 (two) times daily. 09/16/14  Yes Matthew Babish, PA-C  glipiZIDE (GLUCOTROL) 5 MG tablet Take 1 tablet (5 mg total) by mouth 2 (two) times daily. 12/20/14  Yes Gwenlyn Found Copland, MD  HAWTHORN EXTRACT PO Take 1 capsule by mouth daily.   Yes Historical Provider, MD  hydrALAZINE (APRESOLINE) 50 MG tablet Take 1 tablet (50 mg total) by mouth 3 (three) times daily. 07/11/15  Yes Laurey Morale, MD  isosorbide mononitrate (IMDUR) 60 MG 24 hr tablet TAKE 1 TABLET (60 MG TOTAL) BY MOUTH DAILY. 01/03/15  Yes Dolores Patty, MD  metolazone (ZAROXOLYN) 5 MG tablet TAKE 1 TABLET EVERY DAY AS NEEDED FOR SWELLING 08/04/15  Yes Dolores Patty, MD  omega-3 acid ethyl esters (LOVAZA) 1 G capsule Take 1 g by mouth daily.   Yes Historical Provider, MD  oxyCODONE-acetaminophen (PERCOCET) 10-325 MG tablet Take 1 tablet by mouth 4 (four) times daily. scheduled 08/22/15  Yes Jessica C Copland, MD  polyethylene glycol powder (GLYCOLAX/MIRALAX) powder MIX 1 CAPFUL (17GM)  IN  LIQUID  AND  DRINK EVERY DAY AS DIRECTED 07/31/15  Yes Jessica  C Copland, MD  potassium chloride SA (K-DUR,KLOR-CON) 20 MEQ tablet Take 1 tablet (20 mEq total) by mouth daily. 08/08/15  Yes Dolores Patty, MD  prednisoLONE acetate (PRED FORTE) 1 % ophthalmic suspension Place 1 drop into both eyes 4 (four) times daily.   Yes Historical Provider, MD  spironolactone (ALDACTONE) 25 MG tablet Take 0.5 tablets (12.5 mg total) by mouth daily. 07/04/15  Yes Asiyah Mayra Reel, MD  torsemide (DEMADEX) 20 MG tablet TAKE 2 TABLETS BY MOUTH 2 TIMES DAILY  08/11/15  Yes Gwenlyn Found Copland, MD  VITAMIN E PO Take 1 capsule by mouth daily.   Yes Historical Provider, MD   BP 106/46 mmHg  Pulse 65  Temp(Src) 97.4 F (36.3 C) (Oral)  Resp 11  Ht  (1.778 m)  Wt 241 lb 1.6 oz (109.362 kg)  BMI 34.59 kg/m2  SpO2 100% Physical Exam  Constitutional: He is oriented to person, place, and time. He appears well-developed and well-nourished.  HENT:  Head: Normocephalic and atraumatic.  Eyes: Conjunctivae and EOM are normal.  Neck: Normal range of motion. Neck supple.  Cardiovascular: Normal rate, regular rhythm and normal heart sounds.   Pulmonary/Chest: Effort normal. No respiratory distress. He has wheezes (end expiratory, diffuse). He has no rales.  Abdominal: He exhibits no distension. There is no tenderness. There is no rebound and no guarding.  Musculoskeletal: Normal range of motion.       Right lower leg: He exhibits edema.       Left lower leg: He exhibits edema.  Neurological: He is alert and oriented to person, place, and time.  Skin: Skin is warm and dry.  Vitals reviewed.   ED Course  Procedures (including critical care time) Labs Review Labs Reviewed  BASIC METABOLIC PANEL - Abnormal; Notable for the following:    Sodium 122 (*)    Potassium 6.2 (*)    Chloride 89 (*)    CO2 19 (*)    BUN 86 (*)    Creatinine, Ser 7.14 (*)    Calcium 8.5 (*)    GFR calc non Af Amer 7 (*)    GFR calc Af Amer 8 (*)    All other components within normal limits   CBC - Abnormal; Notable for the following:    RBC 3.61 (*)    Hemoglobin 10.3 (*)    HCT 30.9 (*)    RDW 16.8 (*)    All other components within normal limits  BRAIN NATRIURETIC PEPTIDE - Abnormal; Notable for the following:    B Natriuretic Peptide 328.8 (*)    All other components within normal limits  I-STAT TROPOININ, ED    Imaging Review Dg Chest 2 View  08/27/2015   CLINICAL DATA:  Shortness of breath for 3 days  EXAM: CHEST  2 VIEW  COMPARISON:  July 18, 2015  FINDINGS: There is stable scarring in each mid lung region. There is no edema or consolidation. Heart size and pulmonary vascularity are normal. Known adenopathy. No bone lesions.  IMPRESSION: Scarring in each mid lung region.  No edema or consolidation.   Electronically Signed   By: Bretta Bang III M.D.   On: 08/27/2015 13:30   I have personally reviewed and evaluated these images and lab results as part of my medical decision-making.   EKG Interpretation   Date/Time:  Wednesday August 27 2015 12:37:18 EDT Ventricular Rate:  59 PR Interval:    QRS Duration: 121 QT Interval:  441 QTC Calculation: 437 R Axis:   -64 Text Interpretation:  Sinus bradycardia with 1st degree A-V block  Nonspecific IVCD with LAD Confirmed by Mirian Mo [phone]) on  08/27/2015 12:43:37 PM     CRITICAL CARE Performed by: Mirian Mo   Total critical care time: 35 min  Critical care time was exclusive of separately billable procedures and treating other patients.  Critical care was necessary to treat or prevent imminent or life-threatening deterioration.  Critical care was time spent personally by me on the following activities: development  of treatment plan with patient and/or surrogate as well as nursing, discussions with consultants, evaluation of patient's response to treatment, examination of patient, obtaining history from patient or surrogate, ordering and performing treatments and interventions, ordering and  review of laboratory studies, ordering and review of radiographic studies, pulse oximetry and re-evaluation of patient's condition.  MDM   Final diagnoses:  None    64 y.o. male with pertinent PMH of nonischemic cardiomyopathy with EF 20%, COPD, Afib, DM presents with recurrent dyspnea as above. No fevers, productive cough. Patient is up approximately 20 pounds over his baseline weight. On arrival today vitals signs and physical exam as above. Patient has no Rales, is diffusely wheezing. He does have 1-2+ bilateral pitting edema.  Wu as above.  Consulted cardiology and family medicine.  Pt admitted in stable condition.  Ca, insulin, d50 given per recommendation of cardiology.  No ekg changes of hyperkalemia.    I have reviewed all laboratory and imaging studies if ordered as above  CHF exacerbation     Mirian Mo, MD 08/27/15 [phone]

## 2015-08-27 NOTE — ED Notes (Signed)
Admitting MD at bedside.

## 2015-08-27 NOTE — Procedures (Signed)
Central Venous Trialysis Catheter Insertion Procedure Note HAYTHAM VAIRO [id number] 26-Dec-1950  Procedure: Insertion of Central Venous Trialysis Catheter Indications: CVVHD  Procedure Details Consent: Risks of procedure as well as the alternatives and risks of each were explained to the (patient/caregiver).  Consent for procedure obtained. Time Out: Verified patient identification, verified procedure, site/side was marked, verified correct patient position, special equipment/implants available, medications/allergies/relevent history reviewed, required imaging and test results available.  Performed  Maximum sterile technique was used including antiseptics, cap, gloves, gown, hand hygiene, mask and sheet. Skin prep: Chlorhexidine; local anesthetic administered A antimicrobial bonded/coated triple lumen catheter was placed in the right internal jugular vein using the Seldinger technique.  Evaluation Blood flow good Complications: No apparent complications Patient did tolerate procedure well. Chest X-ray ordered to verify placement.  CXR: pending.  Arvilla Meres MD 08/27/2015, 5:54 PM

## 2015-08-27 NOTE — Telephone Encounter (Signed)
Pt being evaluated in ER

## 2015-08-27 NOTE — ED Notes (Signed)
Pt tolerated with RA and remained 99% spO2. MD aware. Pt requesting oxygen for comfort. Pt placed on 2L.

## 2015-08-27 NOTE — ED Notes (Signed)
Cardiology at bedside.

## 2015-08-27 NOTE — Consult Note (Signed)
PULMONARY / CRITICAL CARE MEDICINE   Name: Frank Davidson MRN: [id number] DOB: 12-13-1950    ADMISSION DATE:  08/27/2015 CONSULTATION DATE:  10/5  REFERRING MD :  Bensimhon  CHIEF COMPLAINT:  Dyspnea   INITIAL PRESENTATION: 64yo male with hx COPD, HTN, DM, AFib, CKD III, OSA on CPAP and CHF.  Presented 10/5 to with SOB and weight gain.  Found to be hypotensive, up from 222 lbs to 242 lbs, K 6.2, Cr 7.1.  Admitted by FPTS, seen in consultation by heart failure team for acute on chronic systolic heart failure and cardiorenal syndrome.  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   STUDIES:    SIGNIFICANT EVENTS: 10/5 transfer to the ICU for CVVH.  HISTORY OF PRESENT ILLNESS:  64yo male with hx COPD, HTN, DM, AFib, CKD III, OSA on CPAP and CHF.  About 2 weeks ago began taking an herbal diuretic in addition to his prescribed meds.  Had dwindling uop.  Presented 10/5 to with SOB and weight gain.  Found to be hypotensive, up from 222 lbs to 242 lbs, K 6.2, Cr 7.1.  Admitted by FPTS, seen in consultation by heart failure team for acute on chronic systolic heart failure and cardiorenal syndrome.  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   PAST MEDICAL HISTORY :   has a past medical history of Atrial fibrillation (HCC); Hypertension; COPD (chronic obstructive pulmonary disease) (HCC); Ocular herpes; Gout; CHF (congestive heart failure) (HCC); Chronic pain; Hyperlipidemia; Nonischemic cardiomyopathy [facility]) (Aug 2015); Ocular herpes; Neuropathy (HCC); Left-sided weakness; PVD (peripheral vascular disease) [facility]) (May 2015); Memory impairment; CKD (chronic kidney disease), stage III; Fibromyalgia; Sleep apnea with use of continuous positive airway pressure (CPAP); GERD (gastroesophageal reflux disease); Asthma; Type II diabetes mellitus (HCC); Stroke (HCC) (X 5); Osteoarthritis; Arthritis; and Shortness of breath dyspnea.  has past surgical history that includes Orchiectomy (Left);  Tonsillectomy (age 78); Esophagogastroduodenoscopy (N/A, 12/24/2013); Colonoscopy (N/A, 12/24/2013); Joint replacement; Cardiac catheterization (10/2006); Cataract extraction w/ intraocular lens  implant, bilateral (Bilateral, ~ 2008); Total hip arthroplasty (Left, 09/12/2014); and right heart catheterization (N/A, 08/13/2014). Prior to Admission medications   Medication Sig Start Date End Date Taking? Authorizing Provider  albuterol (PROVENTIL HFA;VENTOLIN HFA) 108 (90 BASE) MCG/ACT inhaler Inhale 2 puffs into the lungs every 6 (six) hours as needed for wheezing or shortness of breath. 06/30/15  Yes Gwenlyn Found Copland, MD  allopurinol (ZYLOPRIM) 100 MG tablet Take 100 mg by mouth daily.   Yes Historical Provider, MD  ALPHA LIPOIC ACID PO Take 1 capsule by mouth daily.   Yes Historical Provider, MD  apixaban (ELIQUIS) 5 MG TABS tablet Take 1 tablet (5 mg total) by mouth 2 (two) times daily. 02/04/15  Yes Dolores Patty, MD  Ascorbic Acid (VITAMIN C PO) Take 1 tablet by mouth daily.   Yes Historical Provider, MD  b complex vitamins tablet Take 1 tablet by mouth daily.   Yes Historical Provider, MD  baclofen (LIORESAL) 10 MG tablet TAKE 1 TABLET EVERY 8 HOURS AS NEEDED FOR MUSCLE SPASM(S) 07/23/15  Yes Gwenlyn Found Copland, MD  carvedilol (COREG) 25 MG tablet TAKE 1/2 TABLET TWICE DAILY  WITH  A  MEAL 04/17/15  Yes Dolores Patty, MD  Coenzyme Q10 (COQ10) 100 MG CAPS Take 100 mg by mouth 2 (two) times daily.   Yes Historical Provider, MD  diclofenac (FLECTOR) 1.3 % PTCH Place 1 patch onto the skin 2 (two) times daily as needed (pain).  Yes Historical Provider, MD  docusate sodium 100 MG CAPS Take 100 mg by mouth 2 (two) times daily. 09/16/14  Yes Matthew Babish, PA-C  glipiZIDE (GLUCOTROL) 5 MG tablet Take 1 tablet (5 mg total) by mouth 2 (two) times daily. 12/20/14  Yes Gwenlyn Found Copland, MD  HAWTHORN EXTRACT PO Take 1 capsule by mouth daily.   Yes Historical Provider, MD  hydrALAZINE (APRESOLINE) 50 MG  tablet Take 1 tablet (50 mg total) by mouth 3 (three) times daily. 07/11/15  Yes Laurey Morale, MD  isosorbide mononitrate (IMDUR) 60 MG 24 hr tablet TAKE 1 TABLET (60 MG TOTAL) BY MOUTH DAILY. 01/03/15  Yes Dolores Patty, MD  metolazone (ZAROXOLYN) 5 MG tablet TAKE 1 TABLET EVERY DAY AS NEEDED FOR SWELLING 08/04/15  Yes Dolores Patty, MD  omega-3 acid ethyl esters (LOVAZA) 1 G capsule Take 1 g by mouth daily.   Yes Historical Provider, MD  oxyCODONE-acetaminophen (PERCOCET) 10-325 MG tablet Take 1 tablet by mouth 4 (four) times daily. scheduled 08/22/15  Yes Jessica C Copland, MD  polyethylene glycol powder (GLYCOLAX/MIRALAX) powder MIX 1 CAPFUL (17GM)  IN  LIQUID  AND  DRINK EVERY DAY AS DIRECTED 07/31/15  Yes Jessica C Copland, MD  potassium chloride SA (K-DUR,KLOR-CON) 20 MEQ tablet Take 1 tablet (20 mEq total) by mouth daily. 08/08/15  Yes Dolores Patty, MD  prednisoLONE acetate (PRED FORTE) 1 % ophthalmic suspension Place 1 drop into both eyes 4 (four) times daily.   Yes Historical Provider, MD  spironolactone (ALDACTONE) 25 MG tablet Take 0.5 tablets (12.5 mg total) by mouth daily. 07/04/15  Yes Asiyah Mayra Reel, MD  torsemide (DEMADEX) 20 MG tablet TAKE 2 TABLETS BY MOUTH 2 TIMES DAILY 08/11/15  Yes Gwenlyn Found Copland, MD  VITAMIN E PO Take 1 capsule by mouth daily.   Yes Historical Provider, MD   Allergies  Allergen Reactions  . Corlanor [Ivabradine] Palpitations and Other (See Comments)    Kidney and heart problems, syncope  . Januvia [Sitagliptin] Other (See Comments)    "Almost killed me"   . Lunesta [Eszopiclone] Palpitations and Other (See Comments)    Kidney and heart problems, syncope  . Actos [Pioglitazone] Other (See Comments)    Caused blood in urine and fluid retention     FAMILY HISTORY:  indicated that the status of his mother is unknown. He indicated that his father is deceased. He indicated that all of his three sisters are alive. He indicated that his  brother is deceased.  SOCIAL HISTORY:  reports that he quit smoking about 8 years ago. His smoking use included Cigarettes. He smoked 0.00 packs per day for 0 years. He has never used smokeless tobacco. He reports that he uses illicit drugs (Cocaine and Marijuana). He reports that he does not drink alcohol.  REVIEW OF SYSTEMS:  As per HPI - All other systems reviewed and were neg.    SUBJECTIVE:   VITAL SIGNS: Temp:  [97.3 F (36.3 C)-97.5 F (36.4 C)] 97.3 F (36.3 C) (10/05 1620) Pulse Rate:  [57-76] 59 (10/05 1620) Resp:  [8-17] 17 (10/05 1620) BP: (84-116)/(40-73) 102/40 mmHg (10/05 1620) SpO2:  [93 %-100 %] 100 % (10/05 1515) Weight:  [241 lb 1.6 oz (109.362 kg)-246 lb 11.2 oz (111.902 kg)] 241 lb 1.6 oz (109.362 kg) (10/05 1237) HEMODYNAMICS:   VENTILATOR SETTINGS:   INTAKE / OUTPUT: No intake or output data in the 24 hours ending 08/27/15 1706  PHYSICAL EXAMINATION: General:  Chronically ill  appearing male, lethargic but arousable and protecting his airway. Neuro:  Arousable, withdraws to pain but lethargic after being given 1 of versed and 25 of fentanyl. HEENT:  Norco/AT, PERRL, EOM-I and MMM. Cardiovascular:  RRR, Nl S1/S2, -M/R/G. Lungs:  Bibasilar crackles and prolonged exp phase. Abdomen:  Soft, NT, ND and +BS. Musculoskeletal:  1+ pedal edema bilaterally.  Skin:  Intact.  LABS:  CBC  Recent Labs Lab 08/27/15 1302  WBC 6.8  HGB 10.3*  HCT 30.9*  PLT 271   Coag's No results for input(s): APTT, INR in the last 168 hours. BMET  Recent Labs Lab 08/27/15 1302  NA 122*  K 6.2*  CL 89*  CO2 19*  BUN 86*  CREATININE 7.14*  GLUCOSE 72   Electrolytes  Recent Labs Lab 08/27/15 1302  CALCIUM 8.5*   Sepsis Markers No results for input(s): LATICACIDVEN, PROCALCITON, O2SATVEN in the last 168 hours. ABG No results for input(s): PHART, PCO2ART, PO2ART in the last 168 hours. Liver Enzymes No results for input(s): AST, ALT, ALKPHOS, BILITOT, ALBUMIN  in the last 168 hours. Cardiac Enzymes No results for input(s): TROPONINI, PROBNP in the last 168 hours. Glucose No results for input(s): GLUCAP in the last 168 hours.  Imaging Dg Chest 2 View  08/27/2015   CLINICAL DATA:  Shortness of breath for 3 days  EXAM: CHEST  2 VIEW  COMPARISON:  July 18, 2015  FINDINGS: There is stable scarring in each mid lung region. There is no edema or consolidation. Heart size and pulmonary vascularity are normal. Known adenopathy. No bone lesions.  IMPRESSION: Scarring in each mid lung region.  No edema or consolidation.   Electronically Signed   By: Bretta Bang III M.D.   On: 08/27/2015 13:30     ASSESSMENT / PLAN:  PULMONARY Dyspnea  COPD  OSA  P:   qhs CPAP  Supplemental O2 as necessary F/u CXSR  Volume removal as able per renal   CARDIOVASCULAR Acute on chronic systolic heart failure r/t NICM with EF 25% Cardiorenal syndrome  PAF Borderline BP. P:  Defer eliquis to cards - ?continue  Diuresis/volume removal with CVVHD  Continue to hold BB, imdur, hydralazine for now  F/u BNP, CXR, troponin  RENAL Acute on CKD - Baseline creatinine 2.4-2.8 -- Admit creatinine 7.1 Hyperkalemia  Hyponatremia  Cardiorenal syndrome  P:   F/u chem  CVVHD per renal, volume negative 100 ml/hr BP permitting. Replace electrolytes as needed. BMET in AM.  GASTROINTESTINAL A:  No acute disease. P:   Renal diet.  HEMATOLOGIC Anemia - mild  P:  Continue eliquis for now  F/u CBC   INFECTIOUS A:  No active infections noted. P:   Monitor off abx for now.  ENDOCRINE DM  P:   SSI   NEUROLOGIC A:  Lethargic now but evidently was completely alert and interactive prior to drugs.  He is protecting his airway so no need for intubation or reversal for now but will keep a close eye on patient. P:   Hold all sedating medications.  Transfer to the ICU for CRRT per renal.  Cardiology managing CHF.  Plan as above.  The patient is critically ill  with multiple organ systems failure and requires high complexity decision making for assessment and support, frequent evaluation and titration of therapies, application of advanced monitoring technologies and extensive interpretation of multiple databases.   Critical Care Time devoted to patient care services described in this note is  35  Minutes. This time  reflects time of care of this signee Dr Koren Bound. This critical care time does not reflect procedure time, or teaching time or supervisory time of PA/NP/Med student/Med Resident etc but could involve care discussion time.  Alyson Reedy, M.D. [facility] Pulmonary/Critical Care Medicine. Pager: [phone]. After hours pager: [phone].

## 2015-08-27 NOTE — Progress Notes (Signed)
eLink Physician-Brief Progress Note Patient Name: Frank Davidson DOB: 01-07-1951 MRN: [id number]   Date of Service  08/27/2015  HPI/Events of Note  Pain. Patient requests home Percocet.  eICU Interventions  Will order Percocet 5/325 mg PO Q 4 hours PRN pain.     Intervention Category Intermediate Interventions: Pain - evaluation and management  Tameron Lama Eugene 08/27/2015, 10:34 PM

## 2015-08-27 NOTE — ED Notes (Signed)
Per EMS- pt has had increased SOB and weight gain for 2 days. Pt has taken his home lasix with no relief.

## 2015-08-27 NOTE — Progress Notes (Signed)
Order was put in to place arterial line. RT was able to get patient to sign a permission form prior to attempt. RT was unsuccessful with 2 attempts. Patient stated he did not want anyone to retry until he was able to get his hip pain under control. RN aware.

## 2015-08-27 NOTE — Progress Notes (Signed)
This chart was scribed for Arlyss Queen, MD by Moises Blood, Medical Scribe. This patient was seen in Room 6 and the patient's care was started 11:26 AM.  Chief Complaint:  Chief Complaint  Patient presents with  . Weight Gain    20-30 lbs  . Shortness of Breath    HPI: Frank Davidson is a 64 y.o. male who has CHF reports to [facility] today complaining of shortness of breath and some abd tightness. He saw his heart failure doctor 3 weeks ago. He was doing fine except for low breathing. He's put on around 18 lbs since Monday. He denies changes in diet and medication. He usually only uses 1 pillow but now using 2. He denies chest pain.   His weight measured on Oct 2nd was 224 lbs. Today, his weight was measured 245 lbs. Weight gain of 17 lbs in last 3 days.  Ejection fraction 20% January 2016  Past Medical History  Diagnosis Date  . Atrial fibrillation     PAF  . Hypertension   . COPD (chronic obstructive pulmonary disease)   . Ocular herpes   . Gout   . CHF (congestive heart failure)   . Chronic pain   . Hyperlipidemia   . Nonischemic cardiomyopathy Aug 2015    EF 15%  . Ocular herpes   . Neuropathy   . Left-sided weakness     "because of the arthritis and edema"  . PVD (peripheral vascular disease) May 2015    abnormal ABIs  . Memory impairment     "short term"  . CKD (chronic kidney disease), stage III     Dr. Mercy Moore follows-holding   . Fibromyalgia     neuropathy- hands, more than feet.  . Sleep apnea with use of continuous positive airway pressure (CPAP)     does not use cpap  . GERD (gastroesophageal reflux disease)     tums as needed  . Asthma   . Type II diabetes mellitus   . Stroke X 5    last stroke aug 2014"; denies residual on 09/12/2014  . Osteoarthritis   . Arthritis     "knees, right elbow, shoulder" (09/12/2014)  . Shortness of breath dyspnea    Past Surgical History  Procedure Laterality Date  . 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  Tonsillectomy  age 32  . Esophagogastroduodenoscopy N/A 12/24/2013    Procedure: ESOPHAGOGASTRODUODENOSCOPY (EGD);  Surgeon: Inda Castle, MD;  Location: [facility];  Service: Endoscopy;  Laterality: N/A;  . Colonoscopy N/A 12/24/2013    Procedure: COLONOSCOPY;  Surgeon: Inda Castle, MD;  Location: [facility];  Service: Endoscopy;  Laterality: N/A;  . Joint replacement    . Cardiac catheterization  10/2006    normal coronary arteries;   Marland Kitchen Cataract extraction w/ intraocular lens  implant, bilateral Bilateral ~ 2008  . Total hip arthroplasty Left 09/12/2014    Procedure: LEFT TOTAL HIP ARTHROPLASTY ANTERIOR APPROACH;  Surgeon: Mauri Pole, MD;  Location: [facility];  Service: Orthopedics;  Laterality: Left;  . Right heart catheterization N/A 08/13/2014    Rt ht cath prior to hip surgery   Social History   Social History  . Marital Status: Divorced    Spouse Name: N/A  . Number of Children: N/A  . Years of Education: N/A   Social History Main Topics  . Smoking status: Former Smoker -- 0.00 packs/day for 0 years    Types: Cigarettes  Quit date: 12/18/2006  . Smokeless tobacco: Never Used  . Alcohol Use: No     Comment: Quit 2008  . Drug Use: Yes    Special: Cocaine, Marijuana     Comment: last used marijuana and cocaine 2008  . Sexual Activity: Not on file   Other Topics Concern  . Not on file   Social History Narrative   Family History  Problem Relation Age of Onset  . Cancer Mother   . Hypertension Mother   . Lung disease Mother   . Diabetes Father   . Diabetes Sister   . Hypertension Sister   . Hypertension Brother   . Hypertension Sister   . Colon cancer Neg Hx    Allergies  Allergen Reactions  . Corlanor [Ivabradine] Palpitations and Other (See Comments)    Kidney and heart problems, syncope  . Januvia [Sitagliptin] Other (See Comments)    "Almost killed me"   . Lunesta [Eszopiclone] Palpitations and Other (See Comments)    Kidney and heart problems,  syncope  . Actos [Pioglitazone] Other (See Comments)    Caused blood in urine and fluid retention    Prior to Admission medications   Medication Sig Start Date End Date Taking? Authorizing Provider  ACCU-CHEK FASTCLIX LANCETS MISC Test blood sugar once daily. Dx code: E11.8 12/20/14   Darreld Mclean, MD  acetaminophen (TYLENOL) 500 MG tablet Take 500 mg by mouth every 6 (six) hours as needed for moderate pain or headache.    Historical Provider, MD  albuterol (PROVENTIL HFA;VENTOLIN HFA) 108 (90 BASE) MCG/ACT inhaler Inhale 2 puffs into the lungs every 6 (six) hours as needed for wheezing or shortness of breath. 06/30/15   Darreld Mclean, MD  Alcohol Swabs (B-D SINGLE USE SWABS REGULAR) PADS Test blood sugar daily. Dx code: E11.8 12/20/14   Darreld Mclean, MD  allopurinol (ZYLOPRIM) 100 MG tablet Take 100 mg by mouth daily.    Historical Provider, MD  ALPHA LIPOIC ACID PO Take 1 capsule by mouth daily.    Historical Provider, MD  apixaban (ELIQUIS) 5 MG TABS tablet Take 1 tablet (5 mg total) by mouth 2 (two) times daily. 02/04/15   Jolaine Artist, MD  Ascorbic Acid (VITAMIN C PO) Take 1 tablet by mouth daily.    Historical Provider, MD  b complex vitamins tablet Take 1 tablet by mouth daily.    Historical Provider, MD  baclofen (LIORESAL) 10 MG tablet TAKE 1 TABLET EVERY 8 HOURS AS NEEDED FOR MUSCLE SPASM(S) 07/23/15   Gay Filler Copland, MD  Blood Glucose Calibration (ACCU-CHEK SMARTVIEW CONTROL) LIQD Test blood sugar once daily. Dx code: E11.8. 12/20/14   Darreld Mclean, MD  Blood Glucose Monitoring Suppl (ACCU-CHEK NANO SMARTVIEW) W/DEVICE KIT Test blood sugar daily. Dx code: E11.8 12/20/14   Darreld Mclean, MD  carvedilol (COREG) 25 MG tablet TAKE 1/2 TABLET TWICE DAILY  WITH  A  MEAL 04/17/15   Jolaine Artist, MD  Celery Seed OIL Take 1 capsule by mouth daily.    Historical Provider, MD  Coenzyme Q10 (COQ10) 100 MG CAPS Take 100 mg by mouth 2 (two) times daily.    Historical  Provider, MD  colchicine 0.6 MG tablet Take 1.2 mg by mouth daily as needed (Gout). Colcrys    Historical Provider, MD  diclofenac (FLECTOR) 1.3 % PTCH Place 1 patch onto the skin 2 (two) times daily as needed (pain).    Historical Provider, MD  docusate sodium 100 MG  CAPS Take 100 mg by mouth 2 (two) times daily. 09/16/14   Danae Orleans, PA-C  glipiZIDE (GLUCOTROL) 5 MG tablet Take 1 tablet (5 mg total) by mouth 2 (two) times daily. 12/20/14   Gay Filler Copland, MD  glucose blood (ACCU-CHEK SMARTVIEW) test strip Test blood sugar once daily. Dx code: E11.8 12/20/14   Darreld Mclean, MD  HAWTHORN EXTRACT PO Take 1 capsule by mouth daily.    Historical Provider, MD  hydrALAZINE (APRESOLINE) 50 MG tablet Take 1 tablet (50 mg total) by mouth 3 (three) times daily. 07/11/15   Larey Dresser, MD  isosorbide mononitrate (IMDUR) 60 MG 24 hr tablet TAKE 1 TABLET (60 MG TOTAL) BY MOUTH DAILY. 01/03/15   Jolaine Artist, MD  metolazone (ZAROXOLYN) 5 MG tablet TAKE 1 TABLET EVERY DAY AS NEEDED FOR SWELLING 08/04/15   Jolaine Artist, MD  omega-3 acid ethyl esters (LOVAZA) 1 G capsule Take 1 g by mouth daily.    Historical Provider, MD  oxyCODONE-acetaminophen (PERCOCET) 10-325 MG tablet Take 1 tablet by mouth 4 (four) times daily. scheduled 08/22/15   Gay Filler Copland, MD  polyethylene glycol powder (GLYCOLAX/MIRALAX) powder MIX 1 CAPFUL (17GM)  IN  LIQUID  AND  DRINK EVERY DAY AS DIRECTED 07/31/15   Gay Filler Copland, MD  potassium chloride SA (K-DUR,KLOR-CON) 20 MEQ tablet Take 1 tablet (20 mEq total) by mouth daily. 08/08/15   Jolaine Artist, MD  prednisoLONE acetate (PRED FORTE) 1 % ophthalmic suspension Place 1 drop into both eyes 4 (four) times daily.    Historical Provider, MD  spironolactone (ALDACTONE) 25 MG tablet Take 0.5 tablets (12.5 mg total) by mouth daily. 07/04/15   Asiyah Cletis Media, MD  torsemide (DEMADEX) 20 MG tablet TAKE 2 TABLETS BY MOUTH 2 TIMES DAILY 08/11/15   Gay Filler Copland,  MD  VITAMIN E PO Take 1 capsule by mouth daily.    Historical Provider, MD     ROS:  Constitutional: negative for chills, fever, night sweats, weight changes, or fatigue  HEENT: negative for vision changes, hearing loss, congestion, rhinorrhea, ST, epistaxis, or sinus pressure Cardiovascular: negative for chest pain or palpitations Respiratory: negative for hemoptysis, wheezing, or cough; positive for shortness of breath Abdominal: negative for abdominal pain, nausea, vomiting, diarrhea, or constipation; positive for abd tightness Dermatological: negative for rash Neurologic: negative for headache, dizziness, or syncope All other systems reviewed and are otherwise negative with the exception to those above and in the HPI.  PHYSICAL EXAM: There were no vitals filed for this visit. Body mass index is 38.63 kg/(m^2).   General: No distress, and alert HEENT:  Normocephalic, atraumatic, oropharynx patent. Eye: Juliette Mangle [facility] Cardiovascular:  no rubs or gallops.  No Carotid bruits, radial pulse intact. No pedal edema. heart sounds distant; no murmur Respiratory: No wheezes, rales, or rhonchi.  No cyanosis; very diminishing breath sounds in both bases Abdominal: No organomegaly, positive bowel sounds. No masses; abd distended, ext support stockings on, 1+ edema Musculoskeletal: Gait intact. No edema, tenderness Skin: No rashes. Neurologic: Facial musculature symmetric. Psychiatric: Patient acts appropriately throughout our interaction.  Lymphatic: No cervical or submandibular lymphadenopathy Genitourinary/Anorectal: No acute findings   LABS: Results for orders placed or performed during the hospital encounter of 08/13/15  Pulmonary function test  Result Value Ref Range   FVC-Pre 2.14 L   FVC-%Pred-Pre 61 %   FVC-Post 2.01 L   FVC-%Pred-Post 57 %   FVC-%Change-Post -6 %   FEV1-Pre 1.37 L  FEV1-%Pred-Pre 51 %   FEV1-Post 1.49 L   FEV1-%Pred-Post 56 %   FEV1-%Change-Post 8 %    FEV6-Pre 2.10 L   FEV6-%Pred-Pre 63 %   FEV6-Post 1.96 L   FEV6-%Pred-Post 58 %   FEV6-%Change-Post -6 %   Pre FEV1/FVC ratio 64 %   FEV1FVC-%Pred-Pre 83 %   Post FEV1/FVC ratio 74 %   FEV1FVC-%Change-Post 15 %   Pre FEV6/FVC Ratio 98 %   FEV6FVC-%Pred-Pre 103 %   Post FEV6/FVC ratio 97 %   FEV6FVC-%Pred-Post 102 %   FEV6FVC-%Change-Post 0 %   FEF 25-75 Pre 0.71 L/sec   FEF2575-%Pred-Pre 29 %   FEF 25-75 Post 0.86 L/sec   FEF2575-%Pred-Post 35 %   FEF2575-%Change-Post 21 %   RV 2.16 L   RV % pred 98 %   TLC 4.73 L   TLC % pred 73 %   DLCO unc 13.78 ml/min/mmHg   DLCO unc % pred 48 %   DL/VA 3.85 ml/min/mmHg/L   DL/VA % pred 87 %     EKG/XRAY:   Primary read interpreted by Dr. Everlene Farrier at [facility].   ASSESSMENT/PLAN: Patient enters with a 17 pound weight gain since October 1. He has a history of nonischemic cardiomyopathy with ejection fraction between 15 and 20%. He has had increasing shortness of breath. EKG will be done patient transported to the hospital for definitive treatment. He was borderline hypotensive in our office and so no diabetics were given. He was placed on O2 3 L.  By signing my name below, I, Moises Blood, attest that this documentation has been prepared under the direction and in the presence of Arlyss Queen, MD. Electronically Signed: Moises Blood, [place]. 08/27/2015 , 11:26 AM .    Johney Maine sideeffects, risk and benefits, and alternatives of medications d/w patient. Patient is aware that all medications have potential sideeffects and we are unable to predict every sideeffect or drug-drug interaction that may occur.  Arlyss Queen MD 08/27/2015 11:26 AM

## 2015-08-28 ENCOUNTER — Inpatient Hospital Stay (HOSPITAL_COMMUNITY): Payer: Commercial Managed Care - HMO

## 2015-08-28 DIAGNOSIS — I480 Paroxysmal atrial fibrillation: Secondary | ICD-10-CM

## 2015-08-28 DIAGNOSIS — I501 Left ventricular failure: Secondary | ICD-10-CM

## 2015-08-28 DIAGNOSIS — E875 Hyperkalemia: Secondary | ICD-10-CM

## 2015-08-28 DIAGNOSIS — N179 Acute kidney failure, unspecified: Secondary | ICD-10-CM | POA: Insufficient documentation

## 2015-08-28 LAB — BASIC METABOLIC PANEL
ANION GAP: 11 (ref 5–15)
Anion gap: 6 (ref 5–15)
BUN: 65 mg/dL — ABNORMAL HIGH (ref 6–20)
BUN: 30 mg/dL — AB (ref 6–20)
CALCIUM: 8.3 mg/dL — AB (ref 8.9–10.3)
CALCIUM: 8.4 mg/dL — AB (ref 8.9–10.3)
CO2: 23 mmol/L (ref 22–32)
CO2: 28 mmol/L (ref 22–32)
CREATININE: 2.32 mg/dL — AB (ref 0.61–1.24)
Chloride: 95 mmol/L — ABNORMAL LOW (ref 101–111)
Chloride: 98 mmol/L — ABNORMAL LOW (ref 101–111)
Creatinine, Ser: 4.95 mg/dL — ABNORMAL HIGH (ref 0.61–1.24)
GFR calc Af Amer: 32 mL/min — ABNORMAL LOW (ref >60)
GFR calc non Af Amer: 11 mL/min — ABNORMAL LOW (ref >60)
GFR calc non Af Amer: 28 mL/min — ABNORMAL LOW (ref >60)
GFR, EST AFRICAN AMERICAN: 13 mL/min — AB (ref >60)
GLUCOSE: 194 mg/dL — AB (ref 65–99)
Glucose, Bld: 110 mg/dL — ABNORMAL HIGH (ref 65–99)
Potassium: 4 mmol/L (ref 3.5–5.1)
Potassium: 4.4 mmol/L (ref 3.5–5.1)
Sodium: 129 mmol/L — ABNORMAL LOW (ref 135–145)
Sodium: 132 mmol/L — ABNORMAL LOW (ref 135–145)

## 2015-08-28 LAB — COMPREHENSIVE METABOLIC PANEL
ALBUMIN: 3.2 g/dL — AB (ref 3.5–5.0)
ALT: 18 U/L (ref 17–63)
ANION GAP: 10 (ref 5–15)
AST: 28 U/L (ref 15–41)
Alkaline Phosphatase: 74 U/L (ref 38–126)
BUN: 49 mg/dL — ABNORMAL HIGH (ref 6–20)
CO2: 25 mmol/L (ref 22–32)
Calcium: 8.1 mg/dL — ABNORMAL LOW (ref 8.9–10.3)
Chloride: 91 mmol/L — ABNORMAL LOW (ref 101–111)
Creatinine, Ser: 3.74 mg/dL — ABNORMAL HIGH (ref 0.61–1.24)
GFR calc Af Amer: 18 mL/min — ABNORMAL LOW (ref >60)
GFR calc non Af Amer: 16 mL/min — ABNORMAL LOW (ref >60)
GLUCOSE: 141 mg/dL — AB (ref 65–99)
POTASSIUM: 3.9 mmol/L (ref 3.5–5.1)
SODIUM: 126 mmol/L — AB (ref 135–145)
Total Bilirubin: 0.5 mg/dL (ref 0.3–1.2)
Total Protein: 6.8 g/dL (ref 6.5–8.1)

## 2015-08-28 LAB — GLUCOSE, CAPILLARY
GLUCOSE-CAPILLARY: 119 mg/dL — AB (ref 65–99)
GLUCOSE-CAPILLARY: 120 mg/dL — AB (ref 65–99)
Glucose-Capillary: 135 mg/dL — ABNORMAL HIGH (ref 65–99)
Glucose-Capillary: 174 mg/dL — ABNORMAL HIGH (ref 65–99)
Glucose-Capillary: 88 mg/dL (ref 65–99)

## 2015-08-28 LAB — POCT ACTIVATED CLOTTING TIME
ACTIVATED CLOTTING TIME: 140 s
ACTIVATED CLOTTING TIME: 171 s
ACTIVATED CLOTTING TIME: 177 s
ACTIVATED CLOTTING TIME: 177 s
ACTIVATED CLOTTING TIME: 177 s
ACTIVATED CLOTTING TIME: 183 s
ACTIVATED CLOTTING TIME: 183 s
ACTIVATED CLOTTING TIME: 189 s
ACTIVATED CLOTTING TIME: 190 s
ACTIVATED CLOTTING TIME: 202 s
Activated Clotting Time: 190 seconds
Activated Clotting Time: 190 seconds
Activated Clotting Time: 196 seconds
Activated Clotting Time: 196 seconds
Activated Clotting Time: 190 seconds
Activated Clotting Time: 190 seconds
Activated Clotting Time: 190 seconds
Activated Clotting Time: 190 seconds
Activated Clotting Time: 190 seconds
Activated Clotting Time: 183 seconds

## 2015-08-28 LAB — PARATHYROID HORMONE, INTACT (NO CA): PTH: 199 pg/mL — ABNORMAL HIGH (ref 15–65)

## 2015-08-28 LAB — CBC
HCT: 34.6 % — ABNORMAL LOW (ref 39.0–52.0)
Hemoglobin: 11.4 g/dL — ABNORMAL LOW (ref 13.0–17.0)
MCH: 28.2 pg (ref 26.0–34.0)
MCHC: 32.9 g/dL (ref 30.0–36.0)
MCV: 85.6 fL (ref 78.0–100.0)
Platelets: 290 10*3/uL (ref 150–400)
RBC: 4.04 MIL/uL — ABNORMAL LOW (ref 4.22–5.81)
RDW: 16.7 % — ABNORMAL HIGH (ref 11.5–15.5)
WBC: 7.1 10*3/uL (ref 4.0–10.5)

## 2015-08-28 LAB — RENAL FUNCTION PANEL
ALBUMIN: 3.1 g/dL — AB (ref 3.5–5.0)
ANION GAP: 7 (ref 5–15)
BUN: 30 mg/dL — AB (ref 6–20)
CALCIUM: 8.4 mg/dL — AB (ref 8.9–10.3)
CO2: 28 mmol/L (ref 22–32)
CREATININE: 2.27 mg/dL — AB (ref 0.61–1.24)
Chloride: 99 mmol/L — ABNORMAL LOW (ref 101–111)
GFR calc Af Amer: 33 mL/min — ABNORMAL LOW (ref >60)
GFR calc non Af Amer: 29 mL/min — ABNORMAL LOW (ref >60)
GLUCOSE: 194 mg/dL — AB (ref 65–99)
PHOSPHORUS: 2.8 mg/dL (ref 2.5–4.6)
Potassium: 4.5 mmol/L (ref 3.5–5.1)
SODIUM: 134 mmol/L — AB (ref 135–145)

## 2015-08-28 LAB — APTT
APTT: 156 s — AB (ref 24–37)
aPTT: 195 seconds — ABNORMAL HIGH (ref 24–37)

## 2015-08-28 LAB — MAGNESIUM
Magnesium: 2.4 mg/dL (ref 1.7–2.4)
Magnesium: 2.4 mg/dL (ref 1.7–2.4)

## 2015-08-28 LAB — HEPARIN LEVEL (UNFRACTIONATED): Heparin Unfractionated: 1 IU/mL — ABNORMAL HIGH (ref 0.30–0.70)

## 2015-08-28 LAB — PHOSPHORUS
PHOSPHORUS: 4.2 mg/dL (ref 2.5–4.6)
Phosphorus: 2.8 mg/dL (ref 2.5–4.6)

## 2015-08-28 LAB — HEPATITIS B SURFACE ANTIGEN: Hepatitis B Surface Ag: NEGATIVE

## 2015-08-28 MED ORDER — SODIUM CHLORIDE 0.9 % IV SOLN
510.0000 mg | Freq: Once | INTRAVENOUS | Status: AC
  Administered 2015-08-28: 510 mg via INTRAVENOUS
  Filled 2015-08-28: qty 17

## 2015-08-28 MED ORDER — HEPARIN (PORCINE) IN NACL 100-0.45 UNIT/ML-% IJ SOLN
900.0000 [IU]/h | INTRAMUSCULAR | Status: DC
  Administered 2015-08-28: 900 [IU]/h via INTRAVENOUS
  Filled 2015-08-28: qty 250

## 2015-08-28 MED ORDER — HEPARIN (PORCINE) IN NACL 100-0.45 UNIT/ML-% IJ SOLN
1150.0000 [IU]/h | INTRAMUSCULAR | Status: DC
  Filled 2015-08-28 (×2): qty 250

## 2015-08-28 MED ORDER — INFLUENZA VAC SPLIT QUAD 0.5 ML IM SUSY
0.5000 mL | PREFILLED_SYRINGE | INTRAMUSCULAR | Status: AC | PRN
  Administered 2015-09-03: 0.5 mL via INTRAMUSCULAR
  Filled 2015-08-28: qty 0.5

## 2015-08-28 MED ORDER — PRISMASOL BGK 4/2.5 32-4-2.5 MEQ/L IV SOLN
INTRAVENOUS | Status: DC
  Administered 2015-08-28 – 2015-08-31 (×23): via INTRAVENOUS_CENTRAL
  Filled 2015-08-28 (×43): qty 5000

## 2015-08-28 MED ORDER — PRISMASOL BGK 4/2.5 32-4-2.5 MEQ/L IV SOLN
INTRAVENOUS | Status: DC
  Administered 2015-08-28 – 2015-08-31 (×10): via INTRAVENOUS_CENTRAL
  Filled 2015-08-28 (×18): qty 5000

## 2015-08-28 MED ORDER — PRISMASOL BGK 4/2.5 32-4-2.5 MEQ/L IV SOLN
INTRAVENOUS | Status: DC
  Administered 2015-08-28 – 2015-08-31 (×10): via INTRAVENOUS_CENTRAL
  Filled 2015-08-28 (×15): qty 5000

## 2015-08-28 MED ORDER — HYDROMORPHONE HCL 1 MG/ML IJ SOLN
2.0000 mg | Freq: Once | INTRAMUSCULAR | Status: AC
  Administered 2015-08-28: 2 mg via INTRAVENOUS
  Filled 2015-08-28: qty 2

## 2015-08-28 NOTE — Progress Notes (Signed)
Subjective: Interval History: has no complaint, but confused, aggitated.  Objective: Vital signs in last 24 hours: Temp:  [96 F (35.6 C)-97.5 F (36.4 C)] 96 F (35.6 C) (10/06 0400) Pulse Rate:  [57-94] 86 (10/06 0600) Resp:  [8-21] 13 (10/06 0600) BP: (84-138)/(40-112) 104/75 mmHg (10/06 0600) SpO2:  [93 %-100 %] 99 % (10/06 0600) Weight:  [109 kg (240 lb 4.8 oz)-111.902 kg (246 lb 11.2 oz)] 109 kg (240 lb 4.8 oz) (10/06 0500) Weight change:   Intake/Output from previous day: 10/05 0701 - 10/06 0700 In: 334.8 [P.O.:220; I.V.:114.8] Out: 2118 [Urine:1350] Intake/Output this shift: Total I/O In: 334.8 [P.O.:220; I.V.:114.8] Out: 2018 [Urine:1250; Other:768]  General appearance: cooperative, delirious and aggitated, perseverates Neck: LIJ cath Resp: diminished breath sounds bilaterally, rales bibasilar and prolonged expir Cardio: irregularly irregular rhythm and systolic murmur: holosystolic 2/6, blowing at apex GI: pos bs, liver down 5 cm Extremities: edema 2+  Lab Results:  Recent Labs  08/27/15 1302 08/28/15 0400  WBC 6.8 7.1  HGB 10.3* 11.4*  HCT 30.9* 34.6*  PLT 271 290   BMET:  Recent Labs  08/28/15 0105 08/28/15 0400  NA 129* 126*  K 4.0 3.9  CL 95* 91*  CO2 23 25  GLUCOSE 110* 141*  BUN 65* 49*  CREATININE 4.95* 3.74*  CALCIUM 8.3* 8.1*   No results for input(s): PTH in the last 72 hours. Iron Studies:  Recent Labs  08/27/15 1955  IRON 63  TIBC 347    Studies/Results: Dg Chest 1 View  08/27/2015   CLINICAL DATA:  Central line  EXAM: CHEST 1 VIEW  COMPARISON:  08/27/2015 at 13:12  FINDINGS: There is a new right jugular central line with tip in the SVC. There is no pneumothorax. There is no other significant interval change. Cardiomegaly and moderate linear opacities persist.  IMPRESSION: New right jugular central line.  No pneumothorax.   Electronically Signed   By: Ellery Plunk M.D.   On: 08/27/2015 18:19   Dg Chest 2  View  08/27/2015   CLINICAL DATA:  Shortness of breath for 3 days  EXAM: CHEST  2 VIEW  COMPARISON:  July 18, 2015  FINDINGS: There is stable scarring in each mid lung region. There is no edema or consolidation. Heart size and pulmonary vascularity are normal. Known adenopathy. No bone lesions.  IMPRESSION: Scarring in each mid lung region.  No edema or consolidation.   Electronically Signed   By: Bretta Bang III M.D.   On: 08/27/2015 13:30   US Renal  08/28/2015   CLINICAL DATA:  Acute onset of renal insufficiency. Initial encounter.  EXAM: RENAL / URINARY TRACT ULTRASOUND COMPLETE  COMPARISON:  None.  FINDINGS: Right Kidney:  Length: 10.6 cm. Mildly increased parenchymal echogenicity noted. No mass or hydronephrosis visualized.  Left Kidney:  Length: 9.7 cm. Mildly increased parenchymal echogenicity noted. No hydronephrosis visualized. A 2.3 cm cyst is noted at the lower pole of the left kidney.  Bladder:  Not visualized on this study.  IMPRESSION: 1. Mildly increased renal parenchymal echogenicity raises concern for medical renal disease. 2. No evidence of hydronephrosis. 3. Small left renal cyst seen.   Electronically Signed   By: Roanna Raider M.D.   On: 08/28/2015 03:30    I have reviewed the patient's current medications.  Assessment/Plan: 1 CKD 4, AKI solute, K ok.  S Na improving, still vol xs.  Urine chem c/w hypoperfusion injury vs toxic.  Appears to be withdrawing.  Still vol xs and  bp tol removal, will ^.  Nonoliguric now.  If cont stable, d/c CRRT within 48 h.   2 CM per Cards. On inotropes 3 DM controlled 4 Polysubstance abuse ? Role 5 COPD 6 Confusion ? With drawal 7 Obesity P CRRT with ^ net neg, follow chem, inotropes,      LOS: 1 day   Antione Obar L 08/28/2015,6:40 AM

## 2015-08-28 NOTE — Progress Notes (Signed)
  Echocardiogram 2D Echocardiogram has been performed.  Arvil Chaco 08/28/2015, 3:27 PM

## 2015-08-28 NOTE — Progress Notes (Signed)
ANTICOAGULATION CONSULT NOTE - Follow-up  Pharmacy Consult for Heparin Indication: atrial fibrillation with h/o CVA  Allergies  Allergen Reactions  . Corlanor [Ivabradine] Palpitations and Other (See Comments)    Kidney and heart problems, syncope  . Entresto [Sacubitril-Valsartan]     Syncope  . Januvia [Sitagliptin] Other (See Comments)    "Almost killed me"   . Lunesta [Eszopiclone] Palpitations and Other (See Comments)    Kidney and heart problems, syncope  . Actos [Pioglitazone] Other (See Comments)    Caused blood in urine and fluid retention     Patient Measurements: Height: 5\' 10"  (177.8 cm) Weight: 240 lb 4.8 oz (109 kg) IBW/kg (Calculated) : 73 Heparin Dosing Weight: 96.5 kg  Vital Signs: Temp: 97.8 F (36.6 C) (10/06 1200) Temp Source: Oral (10/06 1200) BP: 94/78 mmHg (10/06 1600) Pulse Rate: 85 (10/06 1600)  Labs:  Recent Labs  08/27/15 1302 08/27/15 1955 08/28/15 0105 08/28/15 0400 08/28/15 1450  HGB 10.3*  --   --  11.4*  --   HCT 30.9*  --   --  34.6*  --   PLT 271  --   --  290  --   APTT  --  46*  --  195* 156*  HEPARINUNFRC  --  0.73*  --  1.00*  --   CREATININE 7.14* 7.15* 4.95* 3.74* 2.32*  2.27*    Estimated Creatinine Clearance: 40.1 mL/min (by C-G formula based on Cr of 2.27).  Assessment:  64 yom on apixaban PTA transitioned to heparin with CRRT starting. F/u aPTT on 1150 units/h. Heparin level is also elevated, partially due to lingering effects of apixaban. No bleed/IV line issues per RN. Noted, pt also receiving heparin through CRRT - may be d/c'd within 48h.  F/u aPTT is 156 sec on heparin 1150 units/hr. Nurse reports no issues with infusion or bleeding and pt is still on CRRT with heparin.  Goal of Therapy:  Heparin level 0.3-0.7 units/ml aPTT 66-102 seconds Monitor platelets by anticoagulation protocol: Yes   Plan:  Hold heparin for 1.5 hours then restart heparin at 900 units/hr 8h aPTT Daily aPTT/HL (may be elevated due  to interaction with DOAC) HLs only when closer to normal therapeutic range Monitor s/sx bleeding   Arlean Hopping. Newman Pies, PharmD Clinical Pharmacist Pager [phone] 08/28/2015 4:20 PM

## 2015-08-28 NOTE — Progress Notes (Signed)
ACT 189, increased heparin syringe to 1.0 and restarted Q1 hour ACT's per protocol. Will continue to monitor

## 2015-08-28 NOTE — Progress Notes (Signed)
ANTICOAGULATION CONSULT NOTE - Follow-up  Pharmacy Consult for Heparin Indication: atrial fibrillation with h/o CVA  Allergies  Allergen Reactions  . Corlanor [Ivabradine] Palpitations and Other (See Comments)    Kidney and heart problems, syncope  . Entresto [Sacubitril-Valsartan]     Syncope  . Januvia [Sitagliptin] Other (See Comments)    "Almost killed me"   . Lunesta [Eszopiclone] Palpitations and Other (See Comments)    Kidney and heart problems, syncope  . Actos [Pioglitazone] Other (See Comments)    Caused blood in urine and fluid retention     Patient Measurements: Height: 5\' 10"  (177.8 cm) Weight: 240 lb 4.8 oz (109 kg) IBW/kg (Calculated) : 73 Heparin Dosing Weight: 96.5 kg  Vital Signs: Temp: 96.5 F (35.8 C) (10/06 0900) Temp Source: Oral (10/06 0900) BP: 133/83 mmHg (10/06 1000) Pulse Rate: 80 (10/06 1000)  Labs:  Recent Labs  08/27/15 1302 08/27/15 1955 08/28/15 0105 08/28/15 0400  HGB 10.3*  --   --  11.4*  HCT 30.9*  --   --  34.6*  PLT 271  --   --  290  APTT  --  46*  --  195*  HEPARINUNFRC  --  0.73*  --  1.00*  CREATININE 7.14* 7.15* 4.95* 3.74*    Estimated Creatinine Clearance: 24.3 mL/min (by C-G formula based on Cr of 3.74).  Assessment:  64 yom on apixaban PTA transitioned to heparin with CRRT starting. F/u aPTT on 1150 units/h. Heparin level is also elevated, partially due to lingering effects of apixaban. No bleed/IV line issues per RN. Noted, pt also receiving heparin through CRRT - may be d/c'd within 48h.  Goal of Therapy:  Heparin level 0.3-0.7 units/ml Monitor platelets by anticoagulation protocol: Yes   Plan:  D/c Eliquis (Last dose 10/5 AM) IV heparin at 1150 units/hr Note heparin use with CVVHD 8h aPTT Daily aPTT/HL (may be elevated due to interaction with DOAC) HLs only when closer to normal therapeutic range Monitor s/sx bleeding   Babs Bertin, PharmD Clinical Pharmacist Pager [phone] 08/28/2015 10:09 AM

## 2015-08-28 NOTE — Progress Notes (Signed)
Advanced Heart Failure Rounding Note   Subjective:    CVVHD started last night. Pulling about 150/hr. Also urinating about 100/hr  K down to 3.9.   Initial co-ox 65%  Complains of hip pain from arthritis. Has been rude to staff throughout the night. On [employer].     Objective:   Weight Range:  Vital Signs:   Temp:  [96 F (35.6 C)-97.5 F (36.4 C)] 96 F (35.6 C) (10/06 0400) Pulse Rate:  [57-94] 86 (10/06 0500) Resp:  [8-21] 14 (10/06 0500) BP: (84-138)/(40-112) 121/77 mmHg (10/06 0500) SpO2:  [93 %-100 %] 100 % (10/06 0500) Weight:  [109 kg (240 lb 4.8 oz)-111.902 kg (246 lb 11.2 oz)] 109 kg (240 lb 4.8 oz) (10/05 1620) Last BM Date: 08/27/15  Weight change: Filed Weights   08/27/15 1237 08/27/15 1620  Weight: 109.362 kg (241 lb 1.6 oz) 109 kg (240 lb 4.8 oz)    Intake/Output:   Intake/Output Summary (Last 24 hours) at 08/28/15 0543 Last data filed at 08/28/15 0500  Gross per 24 hour  Intake    328 ml  Output   1967 ml  Net  -1639 ml     Physical Exam: General: Lying in bed. Resting. On bair Hugger  HEENT: normal Neck: supple. JVP difficult to assess due to thick neck. RIJ trialysis cath   Carotids 2+ bilat; no bruits. No lymphadenopathy or thryomegaly appreciated. Cor: PMI nonpalpable. Regular rate & rhythm. No rubs, gallops or murmurs. Lungs: clear Abdomen: obese, soft, nontender, mildly distended. No hepatosplenomegaly. No bruits or masses. Good bowel sounds. Extremities: no cyanosis, clubbing, rash,t race 1+edema Neuro: alert & orientedx3, cranial nerves grossly intact. moves all 4 extremities w/o difficulty. Affect pleasant  Telemetry: NSR  Labs: Basic Metabolic Panel:  Recent Labs Lab 08/27/15 1302 08/27/15 1955 08/28/15 0105 08/28/15 0400  NA 122* 124* 129* 126*  K 6.2* 5.3* 4.0 3.9  CL 89* 90* 95* 91*  CO2 19* 20* 23 25  GLUCOSE 72 58* 110* 141*  BUN 86* 85* 65* 49*  CREATININE 7.14* 7.15* 4.95* 3.74*  CALCIUM 8.5* 8.6* 8.3*  8.1*  MG  --  2.5*  --  2.4  PHOS  --   --   --  4.2    Liver Function Tests:  Recent Labs Lab 08/28/15 0400  AST 28  ALT 18  ALKPHOS 74  BILITOT 0.5  PROT 6.8  ALBUMIN 3.2*   No results for input(s): LIPASE, AMYLASE in the last 168 hours. No results for input(s): AMMONIA in the last 168 hours.  CBC:  Recent Labs Lab 08/27/15 1302 08/28/15 0400  WBC 6.8 7.1  HGB 10.3* 11.4*  HCT 30.9* 34.6*  MCV 85.6 85.6  PLT 271 290    Cardiac Enzymes: No results for input(s): CKTOTAL, CKMB, CKMBINDEX, TROPONINI in the last 168 hours.  BNP: BNP (last 3 results)  Recent Labs  07/18/15 1156 08/08/15 1115 08/27/15 1302  BNP 523.3* 805.4* 328.8*    ProBNP (last 3 results)  Recent Labs  10/19/14 2158 10/25/14 1048  PROBNP 1637.0* 3555.0*      Other results:  Imaging: Dg Chest 1 View  08/27/2015   CLINICAL DATA:  Central line  EXAM: CHEST 1 VIEW  COMPARISON:  08/27/2015 at 13:12  FINDINGS: There is a new right jugular central line with tip in the SVC. There is no pneumothorax. There is no other significant interval change. Cardiomegaly and moderate linear opacities persist.  IMPRESSION: New right jugular central line.  No pneumothorax.   Electronically Signed   By: Ellery Plunk M.D.   On: 08/27/2015 18:19   Dg Chest 2 View  08/27/2015   CLINICAL DATA:  Shortness of breath for 3 days  EXAM: CHEST  2 VIEW  COMPARISON:  July 18, 2015  FINDINGS: There is stable scarring in each mid lung region. There is no edema or consolidation. Heart size and pulmonary vascularity are normal. Known adenopathy. No bone lesions.  IMPRESSION: Scarring in each mid lung region.  No edema or consolidation.   Electronically Signed   By: Bretta Bang III M.D.   On: 08/27/2015 13:30   US Renal  08/28/2015   CLINICAL DATA:  Acute onset of renal insufficiency. Initial encounter.  EXAM: RENAL / URINARY TRACT ULTRASOUND COMPLETE  COMPARISON:  None.  FINDINGS: Right Kidney:  Length: 10.6  cm. Mildly increased parenchymal echogenicity noted. No mass or hydronephrosis visualized.  Left Kidney:  Length: 9.7 cm. Mildly increased parenchymal echogenicity noted. No hydronephrosis visualized. A 2.3 cm cyst is noted at the lower pole of the left kidney.  Bladder:  Not visualized on this study.  IMPRESSION: 1. Mildly increased renal parenchymal echogenicity raises concern for medical renal disease. 2. No evidence of hydronephrosis. 3. Small left renal cyst seen.   Electronically Signed   By: Roanna Raider M.D.   On: 08/28/2015 03:30      Medications:     Scheduled Medications: . antiseptic oral rinse  7 mL Mouth Rinse BID  . docusate sodium  100 mg Oral BID  . insulin aspart  0-9 Units Subcutaneous TID WC  . sodium chloride  3 mL Intravenous Q12H     Infusions: . heparin 10,000 units/ 20 mL infusion syringe 350 Units/hr (08/28/15 0000)  . heparin Stopped (08/28/15 0630)  . dialysis replacement fluid (prismasate) 800 mL/hr at 08/28/15 0338  . dialysis replacement fluid (prismasate) 700 mL/hr at 08/28/15 0339  . dialysate (PRISMASATE) 2,000 mL/hr at 08/28/15 0339     PRN Medications:  sodium chloride, albuterol, diclofenac, heparin, heparin, morphine injection, morphine injection, oxyCODONE-acetaminophen, sodium chloride, sodium chloride   Assessment:   1. A/C Systolic Heart Failure due to NICM EF 25% 2. Cardiorenal syndrome 3. Hyperkalemia- K on admit 6.2  4. AKI/CKD stage IV-V - Baseline creatinine 2.4-2.8 -- Admit creatinine 7.1 5. PAF - on Eliquis at home 6. Obesity  7. DM   Plan/Discussion:    Volume status and electrolytes improved. Given low EF, suspicion high for cardiorenal syndrome but initial co-ox suggests normal cardiac output. May have had transient hypotension leading to ATN.   Hopefully can wean CVVHD soon. Await Renal input. Otherwise stable from cardiac perspective. Remains on heparin for PAF.    Length of Stay: 1   Bensimhon, Daniel  MD 08/28/2015, 5:43 AM  Advanced Heart Failure Team Pager [phone] (M-F; 7a - 4p)  Please contact [facility] for night-coverage after hours (4p -7a ) and weekends on [email or web address]

## 2015-08-28 NOTE — Progress Notes (Signed)
64yo male with hx COPD, HTN, DM, AFib, CKD III, OSA on CPAP and CHF. Presented 10/5 to with SOB and weight gain. Found to be hypotensive, up from 222 lbs to 242 lbs, K 6.2, Cr 7.1.On CRRT in ICU. Family medcine following along and appreciate the excellent care by ICU team. We are happy to get him as soon as ICU team thinks he is ready for floor.

## 2015-08-28 NOTE — Progress Notes (Signed)
ANTICOAGULATION CONSULT NOTE - Follow-up  Pharmacy Consult for Heparin Indication: atrial fibrillation with h/o CVA  Allergies  Allergen Reactions  . Corlanor [Ivabradine] Palpitations and Other (See Comments)    Kidney and heart problems, syncope  . Entresto [Sacubitril-Valsartan]     Syncope  . Januvia [Sitagliptin] Other (See Comments)    "Almost killed me"   . Lunesta [Eszopiclone] Palpitations and Other (See Comments)    Kidney and heart problems, syncope  . Actos [Pioglitazone] Other (See Comments)    Caused blood in urine and fluid retention     Patient Measurements: Height: 5\' 10"  (177.8 cm) Weight: 240 lb 4.8 oz (109 kg) IBW/kg (Calculated) : 73 Heparin Dosing Weight: 96.5 kg  Vital Signs: Temp: 96 F (35.6 C) (10/06 0400) Temp Source: Oral (10/06 0400) BP: 121/77 mmHg (10/06 0500) Pulse Rate: 86 (10/06 0500)  Labs:  Recent Labs  08/27/15 1302 08/27/15 1955 08/28/15 0105 08/28/15 0400  HGB 10.3*  --   --  11.4*  HCT 30.9*  --   --  34.6*  PLT 271  --   --  290  APTT  --  46*  --  195*  HEPARINUNFRC  --  0.73*  --  1.00*  CREATININE 7.14* 7.15* 4.95* 3.74*    Estimated Creatinine Clearance: 24.3 mL/min (by C-G formula based on Cr of 3.74).  Assessment:  64 yom on apixaban PTA transitioned to heparin. Initial aPTT is elevated at 195. Heparin level is also elevated, partially due to lingering effects of apixaban. No bleeding noted.   Goal of Therapy:  Heparin level 0.3-0.7 units/ml Monitor platelets by anticoagulation protocol: Yes   Plan:  - Hold heparin x 1 hour - Restart heparin at 1150 units/hr - Check an 8 hour aPTT after heparin is resumed  Lysle Pearl, PharmD, BCPS Pager # [phone] 08/28/2015 5:32 AM

## 2015-08-28 NOTE — Progress Notes (Signed)
Frank Davidson, CCM NP at bedside. Pt agreed to not leave AMA at this time. Pamla Pangle L

## 2015-08-28 NOTE — Progress Notes (Signed)
Patient had ACT of 190 for 3 consecutive hours. Will transition to Q4 ACT's per protocol

## 2015-08-28 NOTE — Progress Notes (Signed)
CCM MD Kasa notified of pt stating he is going to leave AMA. Pt wants 2nd PRN Flector patch. Flector patch applied and order for 2mg  Dilaudid. Will cont to monitor pt. AC paged [phone]. Johndavid Geralds L

## 2015-08-28 NOTE — Progress Notes (Signed)
PULMONARY / CRITICAL CARE MEDICINE   Name: Frank Davidson MRN: [id number] DOB: 1951/08/14    ADMISSION DATE:  08/27/2015 CONSULTATION DATE:  10/5  REFERRING MD :  Bensimhon  CHIEF COMPLAINT:  Dyspnea   INITIAL PRESENTATION: 64yo male with hx COPD, HTN, DM, AFib, CKD III, OSA on CPAP and CHF.  Presented 10/5 to with SOB and weight gain.  Found to be hypotensive, up from 222 lbs to 242 lbs, K 6.2, Cr 7.1.  Admitted by FPTS, seen in consultation by heart failure team for acute on chronic systolic heart failure and cardiorenal syndrome.  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   STUDIES:   SIGNIFICANT EVENTS: 10/5 transfer to the ICU for CVVH. 10/6 20 beat run of NSVT. Remained hemodynamically stable.  HISTORY OF PRESENT ILLNESS:  64yo male with hx COPD, HTN, DM, AFib, CKD III, OSA on CPAP and CHF.  About 2 weeks ago began taking an herbal diuretic in addition to his prescribed meds.  Had dwindling uop.  Presented 10/5 to with SOB and weight gain.  Found to be hypotensive, up from 222 lbs to 242 lbs, K 6.2, Cr 7.1.  Admitted by FPTS, seen in consultation by heart failure team for acute on chronic systolic heart failure and cardiorenal syndrome.  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   PAST MEDICAL HISTORY :   has a past medical history of Atrial fibrillation (HCC); Hypertension; COPD (chronic obstructive pulmonary disease) (HCC); Ocular herpes; Gout; CHF (congestive heart failure) (HCC); Chronic pain; Hyperlipidemia; Nonischemic cardiomyopathy [facility]) (Aug 2015); Ocular herpes; Neuropathy (HCC); Left-sided weakness; PVD (peripheral vascular disease) [facility]) (May 2015); Memory impairment; CKD (chronic kidney disease), stage III; Fibromyalgia; Sleep apnea with use of continuous positive airway pressure (CPAP); GERD (gastroesophageal reflux disease); Asthma; Type II diabetes mellitus (HCC); Stroke (HCC) (X 5); Osteoarthritis; Arthritis; and Shortness of breath dyspnea.  has  past surgical history that includes Orchiectomy (Left); Tonsillectomy (age 57); Esophagogastroduodenoscopy (N/A, 12/24/2013); Colonoscopy (N/A, 12/24/2013); Joint replacement; Cardiac catheterization (10/2006); Cataract extraction w/ intraocular lens  implant, bilateral (Bilateral, ~ 2008); Total hip arthroplasty (Left, 09/12/2014); and right heart catheterization (N/A, 08/13/2014). Prior to Admission medications   Medication Sig Start Date End Date Taking? Authorizing Provider  albuterol (PROVENTIL HFA;VENTOLIN HFA) 108 (90 BASE) MCG/ACT inhaler Inhale 2 puffs into the lungs every 6 (six) hours as needed for wheezing or shortness of breath. 06/30/15  Yes Gwenlyn Found Copland, MD  allopurinol (ZYLOPRIM) 100 MG tablet Take 100 mg by mouth daily.   Yes Historical Provider, MD  ALPHA LIPOIC ACID PO Take 1 capsule by mouth daily.   Yes Historical Provider, MD  apixaban (ELIQUIS) 5 MG TABS tablet Take 1 tablet (5 mg total) by mouth 2 (two) times daily. 02/04/15  Yes Dolores Patty, MD  Ascorbic Acid (VITAMIN C PO) Take 1 tablet by mouth daily.   Yes Historical Provider, MD  b complex vitamins tablet Take 1 tablet by mouth daily.   Yes Historical Provider, MD  baclofen (LIORESAL) 10 MG tablet TAKE 1 TABLET EVERY 8 HOURS AS NEEDED FOR MUSCLE SPASM(S) 07/23/15  Yes Gwenlyn Found Copland, MD  carvedilol (COREG) 25 MG tablet TAKE 1/2 TABLET TWICE DAILY  WITH  A  MEAL 04/17/15  Yes Dolores Patty, MD  Coenzyme Q10 (COQ10) 100 MG CAPS Take 100 mg by mouth 2 (two) times daily.   Yes Historical Provider, MD  diclofenac (FLECTOR) 1.3 % PTCH Place 1 patch onto the  skin 2 (two) times daily as needed (pain).   Yes Historical Provider, MD  docusate sodium 100 MG CAPS Take 100 mg by mouth 2 (two) times daily. 09/16/14  Yes Matthew Babish, PA-C  glipiZIDE (GLUCOTROL) 5 MG tablet Take 1 tablet (5 mg total) by mouth 2 (two) times daily. 12/20/14  Yes Gwenlyn Found Copland, MD  HAWTHORN EXTRACT PO Take 1 capsule by mouth daily.   Yes  Historical Provider, MD  hydrALAZINE (APRESOLINE) 50 MG tablet Take 1 tablet (50 mg total) by mouth 3 (three) times daily. 07/11/15  Yes Laurey Morale, MD  isosorbide mononitrate (IMDUR) 60 MG 24 hr tablet TAKE 1 TABLET (60 MG TOTAL) BY MOUTH DAILY. 01/03/15  Yes Dolores Patty, MD  metolazone (ZAROXOLYN) 5 MG tablet TAKE 1 TABLET EVERY DAY AS NEEDED FOR SWELLING 08/04/15  Yes Dolores Patty, MD  omega-3 acid ethyl esters (LOVAZA) 1 G capsule Take 1 g by mouth daily.   Yes Historical Provider, MD  oxyCODONE-acetaminophen (PERCOCET) 10-325 MG tablet Take 1 tablet by mouth 4 (four) times daily. scheduled 08/22/15  Yes Jessica C Copland, MD  polyethylene glycol powder (GLYCOLAX/MIRALAX) powder MIX 1 CAPFUL (17GM)  IN  LIQUID  AND  DRINK EVERY DAY AS DIRECTED 07/31/15  Yes Jessica C Copland, MD  potassium chloride SA (K-DUR,KLOR-CON) 20 MEQ tablet Take 1 tablet (20 mEq total) by mouth daily. 08/08/15  Yes Dolores Patty, MD  prednisoLONE acetate (PRED FORTE) 1 % ophthalmic suspension Place 1 drop into both eyes 4 (four) times daily.   Yes Historical Provider, MD  spironolactone (ALDACTONE) 25 MG tablet Take 0.5 tablets (12.5 mg total) by mouth daily. 07/04/15  Yes Asiyah Mayra Reel, MD  torsemide (DEMADEX) 20 MG tablet TAKE 2 TABLETS BY MOUTH 2 TIMES DAILY 08/11/15  Yes Gwenlyn Found Copland, MD  VITAMIN E PO Take 1 capsule by mouth daily.   Yes Historical Provider, MD   Allergies  Allergen Reactions  . Corlanor [Ivabradine] Palpitations and Other (See Comments)    Kidney and heart problems, syncope  . Entresto [Sacubitril-Valsartan]     Syncope  . Januvia [Sitagliptin] Other (See Comments)    "Almost killed me"   . Lunesta [Eszopiclone] Palpitations and Other (See Comments)    Kidney and heart problems, syncope  . Actos [Pioglitazone] Other (See Comments)    Caused blood in urine and fluid retention     FAMILY HISTORY:  indicated that the status of his mother is unknown. He indicated that  his father is deceased. He indicated that all of his three sisters are alive. He indicated that his brother is deceased.  SOCIAL HISTORY:  reports that he quit smoking about 8 years ago. His smoking use included Cigarettes. He smoked 0.00 packs per day for 0 years. He has never used smokeless tobacco. He reports that he uses illicit drugs (Cocaine and Marijuana). He reports that he does not drink alcohol.   SUBJECTIVE:  Started on CVVH. No complaints today AM.   VITAL SIGNS: Temp:  [96 F (35.6 C)-97.5 F (36.4 C)] 96.5 F (35.8 C) (10/06 0900) Pulse Rate:  [55-97] 85 (10/06 0900) Resp:  [8-21] 19 (10/06 0900) BP: (84-138)/(40-112) 109/65 mmHg (10/06 0900) SpO2:  [93 %-100 %] 99 % (10/06 0900) Weight:  [240 lb 4.8 oz (109 kg)-246 lb 11.2 oz (111.902 kg)] 240 lb 4.8 oz (109 kg) (10/06 0500) HEMODYNAMICS:   VENTILATOR SETTINGS:   INTAKE / OUTPUT:  Intake/Output Summary (Last 24 hours) at 08/28/15 1006  Last data filed at 08/28/15 1000  Gross per 24 hour  Intake 499.75 ml  Output   2737 ml  Net -2237.25 ml    PHYSICAL EXAMINATION: General:  Awake, interactive. Neuro:  No gross focal deficits HEENT: Moist mucus membranes. Cardiovascular:  S1, S2 +, No MRG. Lungs:  Clear antr Abdomen:  Soft, NT, ND and +BS. Musculoskeletal:  1+ pedal edema bilaterally.  Skin:  Intact.  LABS:  CBC  Recent Labs Lab 08/27/15 1302 08/28/15 0400  WBC 6.8 7.1  HGB 10.3* 11.4*  HCT 30.9* 34.6*  PLT 271 290   Coag's  Recent Labs Lab 08/27/15 1955 08/28/15 0400  APTT 46* 195*   BMET  Recent Labs Lab 08/27/15 1955 08/28/15 0105 08/28/15 0400  NA 124* 129* 126*  K 5.3* 4.0 3.9  CL 90* 95* 91*  CO2 20* 23 25  BUN 85* 65* 49*  CREATININE 7.15* 4.95* 3.74*  GLUCOSE 58* 110* 141*   Electrolytes  Recent Labs Lab 08/27/15 1955 08/28/15 0105 08/28/15 0400  CALCIUM 8.6* 8.3* 8.1*  MG 2.5*  --  2.4  PHOS  --   --  4.2   Sepsis Markers No results for input(s):  LATICACIDVEN, PROCALCITON, O2SATVEN in the last 168 hours. ABG No results for input(s): PHART, PCO2ART, PO2ART in the last 168 hours. Liver Enzymes  Recent Labs Lab 08/28/15 0400  AST 28  ALT 18  ALKPHOS 74  BILITOT 0.5  ALBUMIN 3.2*   Cardiac Enzymes No results for input(s): TROPONINI, PROBNP in the last 168 hours. Glucose  Recent Labs Lab 08/27/15 1559 08/27/15 2343 08/28/15 0904  GLUCAP 128* 88 119*    Imaging Dg Chest 1 View  08/27/2015   CLINICAL DATA:  Central line  EXAM: CHEST 1 VIEW  COMPARISON:  08/27/2015 at 13:12  FINDINGS: There is a new right jugular central line with tip in the SVC. There is no pneumothorax. There is no other significant interval change. Cardiomegaly and moderate linear opacities persist.  IMPRESSION: New right jugular central line.  No pneumothorax.   Electronically Signed   By: Ellery Plunk M.D.   On: 08/27/2015 18:19   Dg Chest 2 View  08/27/2015   CLINICAL DATA:  Shortness of breath for 3 days  EXAM: CHEST  2 VIEW  COMPARISON:  July 18, 2015  FINDINGS: There is stable scarring in each mid lung region. There is no edema or consolidation. Heart size and pulmonary vascularity are normal. Known adenopathy. No bone lesions.  IMPRESSION: Scarring in each mid lung region.  No edema or consolidation.   Electronically Signed   By: Bretta Bang III M.D.   On: 08/27/2015 13:30   US Renal  08/28/2015   CLINICAL DATA:  Acute onset of renal insufficiency. Initial encounter.  EXAM: RENAL / URINARY TRACT ULTRASOUND COMPLETE  COMPARISON:  None.  FINDINGS: Right Kidney:  Length: 10.6 cm. Mildly increased parenchymal echogenicity noted. No mass or hydronephrosis visualized.  Left Kidney:  Length: 9.7 cm. Mildly increased parenchymal echogenicity noted. No hydronephrosis visualized. A 2.3 cm cyst is noted at the lower pole of the left kidney.  Bladder:  Not visualized on this study.  IMPRESSION: 1. Mildly increased renal parenchymal echogenicity raises  concern for medical renal disease. 2. No evidence of hydronephrosis. 3. Small left renal cyst seen.   Electronically Signed   By: Roanna Raider M.D.   On: 08/28/2015 03:30   ASSESSMENT / PLAN:  PULMONARY Dyspnea  COPD  OSA  P:  qhs CPAP  Supplemental O2 as necessary Volume removal as able per renal   CARDIOVASCULAR Acute on chronic systolic heart failure r/t NICM with EF 25% Cardiorenal syndrome  PAF Borderline BP. NSVT noted today. P:  Check and replete lytes. Diuresis/volume removal with CVVHD. Negative 150cc/hr. Heparin anticoagulation for afib.  Continue to hold BB, imdur, hydralazine for now   RENAL Acute on CKD - Baseline creatinine 2.4-2.8 -- Admit creatinine 7.1 Hyperkalemia  Hyponatremia  Cardiorenal syndrome  P:   F/u chem  CVVHD per renal, volume negative 150 ml/hr BP permitting.  GASTROINTESTINAL A:  No acute disease. P:   Renal diet.  HEMATOLOGIC Anemia - mild  P:  F/u CBC   INFECTIOUS A:  No active infections noted. P:   Monitor off abx for now.  ENDOCRINE DM  P:   SSI   NEUROLOGIC A:  Lethargic now but evidently was completely alert and interactive prior to drugs.  He is protecting his airway so no need for intubation or reversal for now but will keep a close eye on patient. P:   Hold all sedating medications.  Transfer to the ICU for CRRT per renal.  Cardiology managing CHF.  Plan as above.  Critical care 40 mins  Chilton Greathouse MD [place] [facility] Pager [phone] If no answer or after 3pm call: [phone] 08/28/2015, 10:07 AM

## 2015-08-28 NOTE — Progress Notes (Signed)
Pt requesting to talk to [facility], Charge RN called, line busy will call back. Frank Davidson

## 2015-08-29 ENCOUNTER — Inpatient Hospital Stay (HOSPITAL_COMMUNITY): Payer: Commercial Managed Care - HMO

## 2015-08-29 DIAGNOSIS — I5023 Acute on chronic systolic (congestive) heart failure: Secondary | ICD-10-CM | POA: Insufficient documentation

## 2015-08-29 LAB — RENAL FUNCTION PANEL
ALBUMIN: 3.2 g/dL — AB (ref 3.5–5.0)
ANION GAP: 9 (ref 5–15)
Albumin: 3.1 g/dL — ABNORMAL LOW (ref 3.5–5.0)
Anion gap: 9 (ref 5–15)
BUN: 15 mg/dL (ref 6–20)
BUN: 11 mg/dL (ref 6–20)
CALCIUM: 8.7 mg/dL — AB (ref 8.9–10.3)
CO2: 29 mmol/L (ref 22–32)
CO2: 27 mmol/L (ref 22–32)
CREATININE: 1.29 mg/dL — AB (ref 0.61–1.24)
Calcium: 8.8 mg/dL — ABNORMAL LOW (ref 8.9–10.3)
Chloride: 98 mmol/L — ABNORMAL LOW (ref 101–111)
Chloride: 100 mmol/L — ABNORMAL LOW (ref 101–111)
Creatinine, Ser: 1.55 mg/dL — ABNORMAL HIGH (ref 0.61–1.24)
GFR calc Af Amer: >60 mL/min (ref >60)
GFR calc non Af Amer: 45 mL/min — ABNORMAL LOW (ref >60)
GFR, EST AFRICAN AMERICAN: 53 mL/min — AB (ref >60)
GFR, EST NON AFRICAN AMERICAN: 57 mL/min — AB (ref >60)
Glucose, Bld: 150 mg/dL — ABNORMAL HIGH (ref 65–99)
Glucose, Bld: 148 mg/dL — ABNORMAL HIGH (ref 65–99)
PHOSPHORUS: 2.3 mg/dL — AB (ref 2.5–4.6)
Phosphorus: 2 mg/dL — ABNORMAL LOW (ref 2.5–4.6)
Potassium: 4.5 mmol/L (ref 3.5–5.1)
Potassium: 4.6 mmol/L (ref 3.5–5.1)
SODIUM: 136 mmol/L (ref 135–145)
SODIUM: 136 mmol/L (ref 135–145)

## 2015-08-29 LAB — CBC
HCT: 34.2 % — ABNORMAL LOW (ref 39.0–52.0)
Hemoglobin: 11.2 g/dL — ABNORMAL LOW (ref 13.0–17.0)
MCH: 28.1 pg (ref 26.0–34.0)
MCHC: 32.7 g/dL (ref 30.0–36.0)
MCV: 85.9 fL (ref 78.0–100.0)
PLATELETS: 260 10*3/uL (ref 150–400)
RBC: 3.98 MIL/uL — AB (ref 4.22–5.81)
RDW: 16.6 % — ABNORMAL HIGH (ref 11.5–15.5)
WBC: 7.9 10*3/uL (ref 4.0–10.5)

## 2015-08-29 LAB — BASIC METABOLIC PANEL
ANION GAP: 11 (ref 5–15)
BUN: 15 mg/dL (ref 6–20)
CO2: 27 mmol/L (ref 22–32)
Calcium: 8.7 mg/dL — ABNORMAL LOW (ref 8.9–10.3)
Chloride: 98 mmol/L — ABNORMAL LOW (ref 101–111)
Creatinine, Ser: 1.45 mg/dL — ABNORMAL HIGH (ref 0.61–1.24)
GFR, EST AFRICAN AMERICAN: 57 mL/min — AB (ref >60)
GFR, EST NON AFRICAN AMERICAN: 49 mL/min — AB (ref >60)
Glucose, Bld: 148 mg/dL — ABNORMAL HIGH (ref 65–99)
POTASSIUM: 4.5 mmol/L (ref 3.5–5.1)
SODIUM: 136 mmol/L (ref 135–145)

## 2015-08-29 LAB — POCT ACTIVATED CLOTTING TIME
ACTIVATED CLOTTING TIME: 183 s
ACTIVATED CLOTTING TIME: 190 s
ACTIVATED CLOTTING TIME: 171 s
ACTIVATED CLOTTING TIME: 165 s
Activated Clotting Time: 190 seconds
Activated Clotting Time: 183 seconds
Activated Clotting Time: 190 seconds
Activated Clotting Time: 196 seconds
Activated Clotting Time: 196 seconds
Activated Clotting Time: 171 seconds
Activated Clotting Time: 177 seconds
Activated Clotting Time: 183 seconds
Activated Clotting Time: 171 seconds
Activated Clotting Time: 196 seconds
Activated Clotting Time: 196 seconds
Activated Clotting Time: 202 seconds

## 2015-08-29 LAB — MAGNESIUM: Magnesium: 2.4 mg/dL (ref 1.7–2.4)

## 2015-08-29 LAB — GLUCOSE, CAPILLARY
GLUCOSE-CAPILLARY: 134 mg/dL — AB (ref 65–99)
Glucose-Capillary: 129 mg/dL — ABNORMAL HIGH (ref 65–99)
Glucose-Capillary: 142 mg/dL — ABNORMAL HIGH (ref 65–99)
Glucose-Capillary: 108 mg/dL — ABNORMAL HIGH (ref 65–99)

## 2015-08-29 LAB — PHOSPHORUS: PHOSPHORUS: 2 mg/dL — AB (ref 2.5–4.6)

## 2015-08-29 LAB — APTT
aPTT: 159 seconds — ABNORMAL HIGH (ref 24–37)
aPTT: 61 seconds — ABNORMAL HIGH (ref 24–37)

## 2015-08-29 MED ORDER — HEPARIN (PORCINE) IN NACL 100-0.45 UNIT/ML-% IJ SOLN
850.0000 [IU]/h | INTRAMUSCULAR | Status: DC
  Filled 2015-08-29 (×2): qty 250

## 2015-08-29 MED ORDER — METOCLOPRAMIDE HCL 5 MG/ML IJ SOLN
5.0000 mg | Freq: Once | INTRAMUSCULAR | Status: AC
  Administered 2015-08-29: 5 mg via INTRAVENOUS
  Filled 2015-08-29: qty 2

## 2015-08-29 MED ORDER — ONDANSETRON HCL 4 MG/2ML IJ SOLN
INTRAMUSCULAR | Status: AC
  Filled 2015-08-29: qty 2

## 2015-08-29 MED ORDER — FUROSEMIDE 80 MG PO TABS
160.0000 mg | ORAL_TABLET | Freq: Four times a day (QID) | ORAL | Status: DC
  Administered 2015-08-29 – 2015-09-01 (×9): 160 mg via ORAL
  Filled 2015-08-29 (×3): qty 2
  Filled 2015-08-29: qty 4
  Filled 2015-08-29 (×11): qty 2
  Filled 2015-08-29: qty 4
  Filled 2015-08-29: qty 2
  Filled 2015-08-29: qty 4
  Filled 2015-08-29 (×2): qty 2

## 2015-08-29 MED ORDER — PROMETHAZINE HCL 25 MG/ML IJ SOLN
6.2500 mg | Freq: Four times a day (QID) | INTRAMUSCULAR | Status: AC | PRN
  Administered 2015-08-29: 6.25 mg via INTRAVENOUS
  Filled 2015-08-29: qty 1

## 2015-08-29 MED ORDER — SODIUM PHOSPHATE 3 MMOLE/ML IV SOLN
20.0000 mmol | Freq: Once | INTRAVENOUS | Status: AC
  Administered 2015-08-29: 20 mmol via INTRAVENOUS
  Filled 2015-08-29: qty 6.67

## 2015-08-29 MED ORDER — ONDANSETRON HCL 4 MG/2ML IJ SOLN
4.0000 mg | Freq: Four times a day (QID) | INTRAMUSCULAR | Status: DC | PRN
  Administered 2015-08-29: 4 mg via INTRAVENOUS

## 2015-08-29 MED ORDER — FAMOTIDINE IN NACL 20-0.9 MG/50ML-% IV SOLN
20.0000 mg | INTRAVENOUS | Status: DC
  Administered 2015-08-29 – 2015-08-31 (×3): 20 mg via INTRAVENOUS
  Filled 2015-08-29 (×3): qty 50

## 2015-08-29 NOTE — Progress Notes (Signed)
64yo male with hx COPD, HTN, DM, AFib, CKD III, OSA on CPAP and CHF. Presented 10/5 with cardiorenal picture and uremia and hyperkalemia. Also hypotensive. Weight up from 222 lbs to 242 lbs, K 6.2, Cr 7.1.Uremic to 80's. Still on CRRT in ICU. Family medcine following along, and appreciate the excellent care by ICU team.

## 2015-08-29 NOTE — Clinical Documentation Improvement (Signed)
Nephrology/Renal  Can the diagnosis of encephalopathy be further specified in progress notes and discharge summary ?    Toxic encephalopathy (specify drug)  Metabolic encephalopathy  Other  Clinically Undetermined  Document any associated diagnoses/conditions.   Supporting Information:  Admitted with A/C renal failure & A/C CHF with Sodium 122 & Potassium 6.2  10/6 progress note: Interval History: has no complaint, but confused, aggitated. General appearance: cooperative, delirious and aggitated, perseverates.   4 Polysubstance abuse ? Role. 6 Confusion ? With drawal  10/7 progress note General appearance: cooperative, moderately obese, slowed mentation and less confused 4 Enceph suspect drug withdrawal.  5 Polysubstance abuse  Treatment: Hemodialysis Monitor Renal function Saline lock IV PO lasix  Please exercise your independent, professional judgment when responding. A specific answer is not anticipated or expected.   Thank You,  Harless Litten Health Information Management [place] [phone]

## 2015-08-29 NOTE — Progress Notes (Signed)
PULMONARY / CRITICAL CARE MEDICINE   Name: Frank Davidson MRN: [id number] DOB: 01-Nov-1951    ADMISSION DATE:  08/27/2015 CONSULTATION DATE:  10/5  REFERRING MD :  Bensimhon  CHIEF COMPLAINT:  Dyspnea   INITIAL PRESENTATION: 64yo male with hx COPD, HTN, DM, AFib, CKD III, OSA on CPAP and CHF.  Presented 10/5 to [facility] with SOB and weight gain.  Found to be hypotensive, up from 222 lbs to 242 lbs, K 6.2, Cr 7.1.  Admitted by FPTS, seen in consultation by heart failure team for acute on chronic systolic heart failure and cardiorenal syndrome.  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    STUDIES:  10/6  ECHO >> LV mildly dilated, systolic function severely reduced, EF 25-30%, diffuse hypokinesis, mild MR, LA/RA, RV mildly dilated   SIGNIFICANT EVENTS: 10/5 transfer to the ICU for CVVH. 10/6 20 beat run of NSVT. Remained hemodynamically stable.   SUBJECTIVE:  Pt denied complaints initially, oriented.  During interview the patient experienced projectile vomiting of clear liquids.  Pt denied warning of vomiting.    VITAL SIGNS: Temp:  [97.8 F (36.6 C)-98.3 F (36.8 C)] 98.3 F (36.8 C) (10/07 0838) Pulse Rate:  [69-120] 98 (10/07 1000) Resp:  [10-22] 14 (10/07 1000) BP: (89-149)/(34-105) 97/34 mmHg (10/07 1000) SpO2:  [91 %-100 %] 100 % (10/07 1000) Weight:  [233 lb 4 oz (105.8 kg)] 233 lb 4 oz (105.8 kg) (10/07 0600)   HEMODYNAMICS:     VENTILATOR SETTINGS:     INTAKE / OUTPUT:  Intake/Output Summary (Last 24 hours) at 08/29/15 1020 Last data filed at 08/29/15 1000  Gross per 24 hour  Intake 381.25 ml  Output   3989 ml  Net -3607.75 ml    PHYSICAL EXAMINATION: General:  Chronically ill appearing male in NAD  Neuro:  No gross focal deficits, MAE, speech clear HEENT: Moist mucus membranes. Cardiovascular:  S1, S2 +, No MRG. Lungs:  Clear antr Abdomen:  Soft, NT, ND and +BS. Vomited during exam - clear liquid emesis  Musculoskeletal:  1+ pedal edema  bilaterally.  Skin:  Intact.  LABS:  CBC  Recent Labs Lab 08/27/15 1302 08/28/15 0400 08/29/15 0400  WBC 6.8 7.1 7.9  HGB 10.3* 11.4* 11.2*  HCT 30.9* 34.6* 34.2*  PLT 271 290 260   Coag's  Recent Labs Lab 08/28/15 0400 08/28/15 1450 08/29/15 0230  APTT 195* 156* 159*   BMET  Recent Labs Lab 08/28/15 1450 08/29/15 0400 08/29/15 0420  NA 132*  134* 136 136  K 4.4  4.5 4.5 4.5  CL 98*  99* 98* 98*  CO2 BUN 30*  30* 15 15  CREATININE 2.32*  2.27* 1.45* 1.55*  GLUCOSE 194*  194* 148* 150*   Electrolytes  Recent Labs Lab 08/28/15 0400 08/28/15 1450 08/29/15 0400 08/29/15 0420  CALCIUM 8.1* 8.4*  8.4* 8.7* 8.7*  MG 2.4 2.4 2.4  --   PHOS 4.2 2.8  2.8 2.0* 2.0*   Sepsis Markers No results for input(s): LATICACIDVEN, PROCALCITON, O2SATVEN in the last 168 hours.   ABG No results for input(s): PHART, PCO2ART, PO2ART in the last 168 hours.   Liver Enzymes  Recent Labs Lab 08/28/15 0400 08/28/15 1450 08/29/15 0420  AST 28  --   --   ALT 18  --   --   ALKPHOS 74  --   --   BILITOT 0.5  --   --   ALBUMIN  3.2* 3.1* 3.1*   Cardiac Enzymes No results for input(s): TROPONINI, PROBNP in the last 168 hours.   Glucose  Recent Labs Lab 08/27/15 1559 08/27/15 2343 08/28/15 0904 08/28/15 1225 08/28/15 1711 08/28/15 2148  GLUCAP 128* 88 119* 120* 174* 135*    Imaging No results found.   ASSESSMENT / PLAN:  PULMONARY A: Dyspnea - in setting of volume overload / acute systolic CHF COPD  OSA  P:   Nocturnal CPAP   Supplemental O2 as necessary Volume removal as able per renal   CARDIOVASCULAR A: Acute on Chronic Systolic CHF r/t NICM with EF 17% Cardiorenal syndrome  PAF Borderline BP. NSVT  P:  Diuresis/volume removal with CVVHD. Marland Kitchen Heparin anticoagulation for afib.  Continue to hold BB, imdur, hydralazine for now   RENAL A: Acute on CKD - Baseline creatinine 2.4-2.8 -- Admit creatinine 7.1 Hyperkalemia   Hyponatremia  Hypophosphatemia  Cardiorenal Syndrome  P:   Trend BMP  CVVHD per renal, plan for 5L removal on 10/7 with possible d/c of CVVHD on 10/8 Replace electrolytes as indicated   GASTROINTESTINAL A:   Nausea / Vomiting - new onset 10/7 am P:   Renal diet as tolerated  PRN zofran for nausea / vomiting  If further vomiting, consider KUB  HEMATOLOGIC A: Anemia - mild  P:  F/u CBC  Heparin infusion per pharmacy (AF)  INFECTIOUS A:   No active infections noted. P:   Monitor off abx for now.  ENDOCRINE DM  P:   SSI   NEUROLOGIC A:   Lethargy - resolved P:   Hold all sedating medications. Monitor neuro status   FAMILY: No family available at bedside.  Patient updated on status.     Canary Brim, NP-C [place] [facility] Pgr: [phone] or if no answer [phone] 08/29/2015, 10:20 AM   Attending Note:  I have examined patient, reviewed labs, studies and notes. I have discussed the case with B Ollis, and I agree with the data and plans as amended above. 64 yo man, hx CHF and chronic renal insufficiency, brought to the ICU for volume removal. Tolerating well, but has been nauseous, has had emesis this am. Will continue supportive care, try to d/c CVVHD on 10/8. He can go out of ICU and to [facility] once CVVHD finished.   Levy Pupa, MD, PhD 08/29/2015, 10:53 AM [place] [facility] [phone] or if no answer [phone]

## 2015-08-29 NOTE — Progress Notes (Signed)
Blood returned

## 2015-08-29 NOTE — Progress Notes (Signed)
Pt vomiting several times with ongoing nausea.  Orders received to put NG tube in.  Patient is refusing NG placement after 1 attempt.  Educated patient about NG placement and reason indicated and patient continues to refuse.  Will continue to monitor.

## 2015-08-29 NOTE — Progress Notes (Signed)
ANTICOAGULATION CONSULT NOTE - Follow-up  Pharmacy Consult for Heparin Indication: atrial fibrillation with h/o CVA  Allergies  Allergen Reactions  . Corlanor [Ivabradine] Palpitations and Other (See Comments)    Kidney and heart problems, syncope  . Entresto [Sacubitril-Valsartan]     Syncope  . Januvia [Sitagliptin] Other (See Comments)    "Almost killed me"   . Lunesta [Eszopiclone] Palpitations and Other (See Comments)    Kidney and heart problems, syncope  . Actos [Pioglitazone] Other (See Comments)    Caused blood in urine and fluid retention     Patient Measurements: Height: 5\' 10"  (177.8 cm) Weight: 240 lb 4.8 oz (109 kg) IBW/kg (Calculated) : 73 Heparin Dosing Weight: 96.5 kg  Vital Signs: Temp: 98.2 F (36.8 C) (10/07 0002) Temp Source: Oral (10/07 0002) BP: 137/67 mmHg (10/07 0300) Pulse Rate: 103 (10/07 0300)  Labs:  Recent Labs  08/27/15 1302  08/27/15 1955 08/28/15 0105 08/28/15 0400 08/28/15 1450 08/29/15 0230  HGB 10.3*  --   --   --  11.4*  --   --   HCT 30.9*  --   --   --  34.6*  --   --   PLT 271  --   --   --  290  --   --   APTT  --   < > 46*  --  195* 156* 159*  HEPARINUNFRC  --   --  0.73*  --  1.00*  --   --   CREATININE 7.14*  --  7.15* 4.95* 3.74* 2.32*  2.27*  --   < > = values in this interval not displayed.  Estimated Creatinine Clearance: 40.1 mL/min (by C-G formula based on Cr of 2.27).  Assessment:  64 yom on apixaban PTA transitioned to heparin with CRRT starting. F/u aPTT on 1150 units/h. Heparin level is also elevated, partially due to lingering effects of apixaban. No bleed/IV line issues per RN. Noted, pt also receiving heparin through CRRT - may be d/c'd within 48h.  F/u aPTT is 159 sec on heparin 900 units/hr. Nurse reports no issues with infusion or bleeding and pt is still on CRRT with heparin.  Goal of Therapy:  Heparin level 0.3-0.7 units/ml aPTT 66-102 seconds Monitor platelets by anticoagulation protocol: Yes   Plan:  - Hold heparin x 2 hours - Resume heparin at 750 units/hr - Check an 8 hour aPTT  Lysle Pearl, PharmD, BCPS Pager # [phone] 08/29/2015 3:40 AM

## 2015-08-29 NOTE — Progress Notes (Signed)
Called by RN regarding ongoing vomiting.  KUB showing severe gaseous distention of the stomach.  Patient is refusing placement of NGT.  QTc reviewed from 10/6 EKG - 437.   Plan: Reglan 5mg  IV x1  PRN phenergan 6.25 mg IV x1  NPO  Will discuss with patient regarding NGT placement     Canary Brim, NP-C [place] [facility] Pgr: [phone] or if no answer [phone] 08/29/2015, 2:01 PM

## 2015-08-29 NOTE — Progress Notes (Signed)
ANTICOAGULATION CONSULT NOTE - Follow-up  Pharmacy Consult for Heparin Indication: atrial fibrillation with h/o CVA  Allergies  Allergen Reactions  . Corlanor [Ivabradine] Palpitations and Other (See Comments)    Kidney and heart problems, syncope  . Entresto [Sacubitril-Valsartan]     Syncope  . Januvia [Sitagliptin] Other (See Comments)    "Almost killed me"   . Lunesta [Eszopiclone] Palpitations and Other (See Comments)    Kidney and heart problems, syncope  . Actos [Pioglitazone] Other (See Comments)    Caused blood in urine and fluid retention     Patient Measurements: Height: 5\' 10"  (177.8 cm) Weight: 233 lb 4 oz (105.8 kg) IBW/kg (Calculated) : 73 Heparin Dosing Weight: 96.5 kg  Vital Signs: Temp: 98.2 F (36.8 C) (10/07 1311) Temp Source: Oral (10/07 1311) BP: 87/69 mmHg (10/07 1300) Pulse Rate: 103 (10/07 1300)  Labs:  Recent Labs  08/27/15 1302  08/27/15 1955  08/28/15 0400 08/28/15 1450 08/29/15 0230 08/29/15 0400 08/29/15 0420 08/29/15 1403  HGB 10.3*  --   --   --  11.4*  --   --  11.2*  --   --   HCT 30.9*  --   --   --  34.6*  --   --  34.2*  --   --   PLT 271  --   --   --  290  --   --  260  --   --   APTT  --   < > 46*  --  195* 156* 159*  --   --  61*  HEPARINUNFRC  --   --  0.73*  --  1.00*  --   --   --   --   --   CREATININE 7.14*  --  7.15*  < > 3.74* 2.32*  2.27*  --  1.45* 1.55*  --   < > = values in this interval not displayed.  Estimated Creatinine Clearance: 57.9 mL/min (by C-G formula based on Cr of 1.55).  Assessment:  64 yom on apixaban PTA transitioned to heparin with CRRT starting. F/u aPTT on 1150 units/h. Heparin level is also elevated, partially due to lingering effects of apixaban. No bleed/IV line issues per RN. Noted, pt also receiving heparin through CRRT - may be d/c'd within 48h.  APTT now slightly subtherapeutic (61) after holding for ~2 hours and restarting at lower rate (previously supratherapeutic in 150s). No HL  from today even though lab is in chart. No bleed/IV line issues per RN. Hg stable 11.2, plt wnl   Goal of Therapy:  Heparin level 0.3-0.7 units/ml aPTT 66-102 seconds Monitor platelets by anticoagulation protocol: Yes   Plan:  Increase heparin to 850 units/h (Eliquis on hold - last dose 10/5) Note heparin use with CVVHDF also - may d/c CRRT within 48h 8h aPTT, Daily aPTT/HL (may be elevated due to DOAC) HLs only when closer to normal therapeutic range Monitor s/sx bleeding   Babs Bertin, PharmD Clinical Pharmacist Pager [phone] 08/29/2015 2:34 PM

## 2015-08-29 NOTE — Progress Notes (Signed)
Advanced Heart Failure Rounding Note   Subjective:    Volume status improving with CVVHD. Weight down almost 10 pounds. Denies dyspnea. Was nauseated. KUB with severe gaseous distension of stomach. Refused NGT. Did have good BM. Making urine.   ECHO 10/6 EF 25-30% RV ok    Objective:   Weight Range:  Vital Signs:   Temp:  [98.2 F (36.8 C)-98.3 F (36.8 C)] 98.3 F (36.8 C) (10/07 1600) Pulse Rate:  [64-120] 90 (10/07 1800) Resp:  [10-25] 17 (10/07 1800) BP: (87-149)/(34-105) 117/64 mmHg (10/07 1800) SpO2:  [90 %-100 %] 95 % (10/07 1800) Weight:  [105.8 kg (233 lb 4 oz)] 105.8 kg (233 lb 4 oz) (10/07 0600) Last BM Date: 08/29/15  Weight change: Filed Weights   08/27/15 1620 08/28/15 0500 08/29/15 0600  Weight: 109 kg (240 lb 4.8 oz) 109 kg (240 lb 4.8 oz) 105.8 kg (233 lb 4 oz)    Intake/Output:   Intake/Output Summary (Last 24 hours) at 08/29/15 1825 Last data filed at 08/29/15 1800  Gross per 24 hour  Intake 496.72 ml  Output   4173 ml  Net -3676.28 ml     Physical Exam: General: Lying in bed. Resting. On bair Hugger  HEENT: normal Neck: supple. JVP difficult to assess due to thick neck. RIJ trialysis cath   Carotids 2+ bilat; no bruits. No lymphadenopathy or thryomegaly appreciated. Cor: PMI nonpalpable. Regular rate & rhythm. No rubs, gallops or murmurs. Lungs: clear Abdomen: obese, soft, nontender, mildly distended. No hepatosplenomegaly. No bruits or masses. Good bowel sounds. Extremities: no cyanosis, clubbing, rash,t race tr edema Neuro: alert & orientedx3, cranial nerves grossly intact. moves all 4 extremities w/o difficulty. Affect pleasant  Telemetry: NSR  Labs: Basic Metabolic Panel:  Recent Labs Lab 08/27/15 1955  08/28/15 0400 08/28/15 1450 08/29/15 0400 08/29/15 0420 08/29/15 1600  NA 124*  < > 126* 132*  134* 136 136 136  K 5.3*  < > 3.9 4.4  4.5 4.5 4.5 4.6  CL 90*  < > 91* 98*  99* 98* 98* 100*  CO2 20*  < > GLUCOSE 58*  < > 141* 194*  194* 148* 150* 148*  BUN 85*  < > 49* 30*  30* CREATININE 7.15*  < > 3.74* 2.32*  2.27* 1.45* 1.55* 1.29*  CALCIUM 8.6*  < > 8.1* 8.4*  8.4* 8.7* 8.7* 8.8*  MG 2.5*  --  2.4 2.4 2.4  --   --   PHOS  --   --  4.2 2.8  2.8 2.0* 2.0* 2.3*  < > = values in this interval not displayed.  Liver Function Tests:  Recent Labs Lab 08/28/15 0400 08/28/15 1450 08/29/15 0420 08/29/15 1600  AST 28  --   --   --   ALT 18  --   --   --   ALKPHOS 74  --   --   --   BILITOT 0.5  --   --   --   PROT 6.8  --   --   --   ALBUMIN 3.2* 3.1* 3.1* 3.2*   No results for input(s): LIPASE, AMYLASE in the last 168 hours. No results for input(s): AMMONIA in the last 168 hours.  CBC:  Recent Labs Lab 08/27/15 1302 08/28/15 0400 08/29/15 0400  WBC 6.8 7.1 7.9  HGB 10.3* 11.4* 11.2*  HCT 30.9* 34.6* 34.2*  MCV 85.6 85.6 85.9  PLT 271 290 260    Cardiac Enzymes: No results for input(s): CKTOTAL, CKMB, CKMBINDEX, TROPONINI in the last 168 hours.  BNP: BNP (last 3 results)  Recent Labs  07/18/15 1156 08/08/15 1115 08/27/15 1302  BNP 523.3* 805.4* 328.8*    ProBNP (last 3 results)  Recent Labs  10/19/14 2158 10/25/14 1048  PROBNP 1637.0* 3555.0*      Other results:  Imaging: US Renal  08/28/2015   CLINICAL DATA:  Acute onset of renal insufficiency. Initial encounter.  EXAM: RENAL / URINARY TRACT ULTRASOUND COMPLETE  COMPARISON:  None.  FINDINGS: Right Kidney:  Length: 10.6 cm. Mildly increased parenchymal echogenicity noted. No mass or hydronephrosis visualized.  Left Kidney:  Length: 9.7 cm. Mildly increased parenchymal echogenicity noted. No hydronephrosis visualized. A 2.3 cm cyst is noted at the lower pole of the left kidney.  Bladder:  Not visualized on this study.  IMPRESSION: 1. Mildly increased renal parenchymal echogenicity raises concern for medical renal disease. 2. No evidence of hydronephrosis. 3. Small left renal cyst  seen.   Electronically Signed   By: Roanna Raider M.D.   On: 08/28/2015 03:30   Dg Abd Portable 1v  08/29/2015   CLINICAL DATA:  Abdominal pain, nausea, vomiting  EXAM: PORTABLE ABDOMEN - 1 VIEW  COMPARISON:  None.  FINDINGS: There is no bowel dilatation to suggest obstruction. There is severe gaseous distention of the stomach. There is no evidence of pneumoperitoneum, portal venous gas or pneumatosis. There are no pathologic calcifications along the expected course of the ureters.The osseous structures are unremarkable.  IMPRESSION: Severe gaseous distention of the stomach which may reflect gastric outlet obstruction.   Electronically Signed   By: Elige Ko   On: 08/29/2015 13:35     Medications:     Scheduled Medications: . antiseptic oral rinse  7 mL Mouth Rinse BID  . docusate sodium  100 mg Oral BID  . famotidine (PEPCID) IV  20 mg Intravenous Q24H  . furosemide  160 mg Oral Q6H  . insulin aspart  0-9 Units Subcutaneous TID WC  . sodium chloride  3 mL Intravenous Q12H    Infusions: . heparin 10,000 units/ 20 mL infusion syringe 1,250 Units/hr (08/29/15 1716)  . heparin 850 Units/hr (08/29/15 1800)  . dialysis replacement fluid (prismasate) 800 mL/hr at 08/29/15 1239  . dialysis replacement fluid (prismasate) 700 mL/hr at 08/29/15 1746  . dialysate (PRISMASATE) 2,000 mL/hr at 08/29/15 1324    PRN Medications: sodium chloride, albuterol, heparin, heparin, Influenza vac split quadrivalent PF, morphine injection, morphine injection, ondansetron (ZOFRAN) IV, oxyCODONE-acetaminophen, sodium chloride, sodium chloride   Assessment:   1. A/C Systolic Heart Failure due to NICM EF 25% 2. Cardiorenal syndrome 3. Hyperkalemia- K on admit 6.2  4. AKI/CKD stage IV-V - Baseline creatinine 2.4-2.8 -- Admit creatinine 7.1 5. PAF - on Eliquis at home 6. Obesity  7. DM  8. Ileus  Plan/Discussion:    Volume status and electrolytes improving. . Given low EF, suspicion high for  cardiorenal syndrome but initial co-ox suggests normal cardiac output. May have had transient hypotension leading to ATN.   Agree with plan to CVVHD soon and see where his renal function lands. Remains on heparin for PAF.    Length of Stay: 2   Arvilla Meres MD 08/29/2015, 6:25 PM  Advanced Heart Failure Team Pager [phone] (M-F; 7a - 4p)  Please contact [facility] for night-coverage after hours (4p -7a ) and weekends on [email or web address]

## 2015-08-29 NOTE — Progress Notes (Signed)
Subjective: Interval History: has no complaint .  Objective: Vital signs in last 24 hours: Temp:  [96.5 F (35.8 C)-98.3 F (36.8 C)] 98.3 F (36.8 C) (10/07 0402) Pulse Rate:  [69-120] 95 (10/07 0700) Resp:  [10-22] 15 (10/07 0700) BP: (94-149)/(62-105) 127/66 mmHg (10/07 0700) SpO2:  [91 %-100 %] 100 % (10/07 0700) Weight:  [105.8 kg (233 lb 4 oz)] 105.8 kg (233 lb 4 oz) (10/07 0600) Weight change: -3.562 kg (-7 lb 13.7 oz)  Intake/Output from previous day: 10/06 0701 - 10/07 0700 In: 469.3 [P.O.:138; I.V.:214.3; IV Piggyback:117] Out: 4133 [Urine:2295] Intake/Output this shift:    General appearance: cooperative, moderately obese, slowed mentation and less confused Neck: IJ cath Resp: diminished breath sounds bilaterally and rales bibasilar Cardio: S1, S2 normal and systolic murmur: holosystolic 2/6, blowing at apex GI: obese, pos bs, soft Extremities: edema 1-2+  Lab Results:  Recent Labs  08/28/15 0400 08/29/15 0400  WBC 7.1 7.9  HGB 11.4* 11.2*  HCT 34.6* 34.2*  PLT 290 260   BMET:  Recent Labs  08/29/15 0400 08/29/15 0420  NA 136 136  K 4.5 4.5  CL 98* 98*  CO2 27 29  GLUCOSE 148* 150*  BUN 15 15  CREATININE 1.45* 1.55*  CALCIUM 8.7* 8.7*    Recent Labs  08/27/15 1955  PTH 199*   Iron Studies:  Recent Labs  08/27/15 1955  IRON 63  TIBC 347    Studies/Results: Dg Chest 1 View  08/27/2015   CLINICAL DATA:  Central line  EXAM: CHEST 1 VIEW  COMPARISON:  08/27/2015 at 13:12  FINDINGS: There is a new right jugular central line with tip in the SVC. There is no pneumothorax. There is no other significant interval change. Cardiomegaly and moderate linear opacities persist.  IMPRESSION: New right jugular central line.  No pneumothorax.   Electronically Signed   By: Ellery Plunk M.D.   On: 08/27/2015 18:19   Dg Chest 2 View  08/27/2015   CLINICAL DATA:  Shortness of breath for 3 days  EXAM: CHEST  2 VIEW  COMPARISON:  July 18, 2015   FINDINGS: There is stable scarring in each mid lung region. There is no edema or consolidation. Heart size and pulmonary vascularity are normal. Known adenopathy. No bone lesions.  IMPRESSION: Scarring in each mid lung region.  No edema or consolidation.   Electronically Signed   By: Bretta Bang III M.D.   On: 08/27/2015 13:30   US Renal  08/28/2015   CLINICAL DATA:  Acute onset of renal insufficiency. Initial encounter.  EXAM: RENAL / URINARY TRACT ULTRASOUND COMPLETE  COMPARISON:  None.  FINDINGS: Right Kidney:  Length: 10.6 cm. Mildly increased parenchymal echogenicity noted. No mass or hydronephrosis visualized.  Left Kidney:  Length: 9.7 cm. Mildly increased parenchymal echogenicity noted. No hydronephrosis visualized. A 2.3 cm cyst is noted at the lower pole of the left kidney.  Bladder:  Not visualized on this study.  IMPRESSION: 1. Mildly increased renal parenchymal echogenicity raises concern for medical renal disease. 2. No evidence of hydronephrosis. 3. Small left renal cyst seen.   Electronically Signed   By: Roanna Raider M.D.   On: 08/28/2015 03:30    I have reviewed the patient's current medications.  Assessment/Plan: 1  AKI  CRRT removing fluid, down 5 L,  Solute/acid/base/K ok now.  Still up about 5 L.  Will plan to remove another 5 L, see if makes urine, and if ok get off CRRT 2 CKD  4 suspect will not get to baseline 3 Obesity 4 Enceph suspect drug withdrawal.   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

## 2015-08-29 NOTE — Progress Notes (Signed)
Spoke with Dr Deterding regarding patient vomiting and PO Lasix not given.  Orders received to not give PO Lasix.

## 2015-08-30 DIAGNOSIS — R410 Disorientation, unspecified: Secondary | ICD-10-CM

## 2015-08-30 DIAGNOSIS — R112 Nausea with vomiting, unspecified: Secondary | ICD-10-CM

## 2015-08-30 LAB — RENAL FUNCTION PANEL
ALBUMIN: 3.3 g/dL — AB (ref 3.5–5.0)
ALBUMIN: 3.2 g/dL — AB (ref 3.5–5.0)
ANION GAP: 11 (ref 5–15)
Anion gap: 7 (ref 5–15)
BUN: 8 mg/dL (ref 6–20)
BUN: 8 mg/dL (ref 6–20)
CALCIUM: 8.8 mg/dL — AB (ref 8.9–10.3)
CALCIUM: 8.8 mg/dL — AB (ref 8.9–10.3)
CHLORIDE: 98 mmol/L — AB (ref 101–111)
CO2: 26 mmol/L (ref 22–32)
CO2: 28 mmol/L (ref 22–32)
CREATININE: 1.34 mg/dL — AB (ref 0.61–1.24)
Chloride: 102 mmol/L (ref 101–111)
Creatinine, Ser: 1.42 mg/dL — ABNORMAL HIGH (ref 0.61–1.24)
GFR calc Af Amer: >60 mL/min (ref >60)
GFR, EST AFRICAN AMERICAN: 58 mL/min — AB (ref >60)
GFR, EST NON AFRICAN AMERICAN: 50 mL/min — AB (ref >60)
GFR, EST NON AFRICAN AMERICAN: 54 mL/min — AB (ref >60)
GLUCOSE: 129 mg/dL — AB (ref 65–99)
Glucose, Bld: 140 mg/dL — ABNORMAL HIGH (ref 65–99)
PHOSPHORUS: 2 mg/dL — AB (ref 2.5–4.6)
PHOSPHORUS: 1.9 mg/dL — AB (ref 2.5–4.6)
Potassium: 4.8 mmol/L (ref 3.5–5.1)
Potassium: 4.1 mmol/L (ref 3.5–5.1)
SODIUM: 139 mmol/L (ref 135–145)
SODIUM: 133 mmol/L — AB (ref 135–145)

## 2015-08-30 LAB — POCT ACTIVATED CLOTTING TIME
ACTIVATED CLOTTING TIME: 214 s
ACTIVATED CLOTTING TIME: 214 s
ACTIVATED CLOTTING TIME: 220 s
Activated Clotting Time: 220 seconds
Activated Clotting Time: 202 seconds
Activated Clotting Time: 202 seconds

## 2015-08-30 LAB — MAGNESIUM: MAGNESIUM: 2.3 mg/dL (ref 1.7–2.4)

## 2015-08-30 LAB — CBC
HCT: 34.6 % — ABNORMAL LOW (ref 39.0–52.0)
Hemoglobin: 11.4 g/dL — ABNORMAL LOW (ref 13.0–17.0)
MCH: 28.6 pg (ref 26.0–34.0)
MCHC: 32.9 g/dL (ref 30.0–36.0)
MCV: 86.7 fL (ref 78.0–100.0)
PLATELETS: 235 10*3/uL (ref 150–400)
RBC: 3.99 MIL/uL — AB (ref 4.22–5.81)
RDW: 16.7 % — ABNORMAL HIGH (ref 11.5–15.5)
WBC: 10.7 10*3/uL — AB (ref 4.0–10.5)

## 2015-08-30 LAB — GLUCOSE, CAPILLARY
GLUCOSE-CAPILLARY: 128 mg/dL — AB (ref 65–99)
Glucose-Capillary: 130 mg/dL — ABNORMAL HIGH (ref 65–99)
Glucose-Capillary: 132 mg/dL — ABNORMAL HIGH (ref 65–99)
Glucose-Capillary: 115 mg/dL — ABNORMAL HIGH (ref 65–99)

## 2015-08-30 LAB — APTT
aPTT: 140 seconds — ABNORMAL HIGH (ref 24–37)
aPTT: >200 seconds (ref 24–37)
aPTT: >200 seconds (ref 24–37)

## 2015-08-30 LAB — HEPARIN LEVEL (UNFRACTIONATED)
HEPARIN UNFRACTIONATED: 0.93 [IU]/mL — AB (ref 0.30–0.70)
Heparin Unfractionated: 0.69 IU/mL (ref 0.30–0.70)

## 2015-08-30 MED ORDER — SODIUM PHOSPHATE 3 MMOLE/ML IV SOLN
30.0000 mmol | Freq: Once | INTRAVENOUS | Status: AC
  Administered 2015-08-30: 30 mmol via INTRAVENOUS
  Filled 2015-08-30: qty 10

## 2015-08-30 MED ORDER — HEPARIN (PORCINE) IN NACL 100-0.45 UNIT/ML-% IJ SOLN
700.0000 [IU]/h | INTRAMUSCULAR | Status: DC
  Administered 2015-08-30: 700 [IU]/h via INTRAVENOUS
  Filled 2015-08-30: qty 250

## 2015-08-30 MED ORDER — HYDROMORPHONE HCL 1 MG/ML IJ SOLN
1.0000 mg | INTRAMUSCULAR | Status: DC | PRN
  Administered 2015-08-30 (×3): 1 mg via INTRAVENOUS
  Filled 2015-08-30 (×3): qty 1

## 2015-08-30 MED ORDER — HEPARIN (PORCINE) IN NACL 100-0.45 UNIT/ML-% IJ SOLN
1500.0000 [IU]/h | INTRAMUSCULAR | Status: DC
  Administered 2015-09-01: 850 [IU]/h via INTRAVENOUS
  Administered 2015-09-02: 1400 [IU]/h via INTRAVENOUS
  Filled 2015-08-30 (×2): qty 250

## 2015-08-30 NOTE — Progress Notes (Signed)
ANTICOAGULATION CONSULT NOTE - Follow Up Consult  Pharmacy Consult for heparin Indication: atrial fibrillation   Labs:  Recent Labs  08/27/15 1302  08/27/15 1955  08/28/15 0400  08/29/15 0230 08/29/15 0400 08/29/15 0420 08/29/15 1403 08/29/15 1600 08/29/15 2343  HGB 10.3*  --   --   --  11.4*  --   --  11.2*  --   --   --   --   HCT 30.9*  --   --   --  34.6*  --   --  34.2*  --   --   --   --   PLT 271  --   --   --  290  --   --  260  --   --   --   --   APTT  --   < > 46*  --  195*  < > 159*  --   --  61*  --  >200*  HEPARINUNFRC  --   --  0.73*  --  1.00*  --   --   --   --   --   --   --   CREATININE 7.14*  --  7.15*  < > 3.74*  < >  --  1.45* 1.55*  --  1.29*  --   < > = values in this interval not displayed.   Assessment: 64yo male now supratherapeutic on heparin after one PTT below goal; pt also getting heparin w/ CRRT.  Goal of Therapy:  aPTT 66-102 seconds   Plan:  Will hold heparin gtt x63min then resume at lower rate of 700 units/hr and check PTT in 6hr.  Vernard Gambles, PharmD, BCPS  08/30/2015,1:01 AM

## 2015-08-30 NOTE — Progress Notes (Signed)
eLink Physician-Brief Progress Note Patient Name: Frank Davidson DOB: [birth year] MRN: [id number]   Date of Service  08/30/2015  HPI/Events of Note  Hypophosphatemia  eICU Interventions  Phos replaced     Intervention Category Intermediate Interventions: Electrolyte abnormality - evaluation and management  DETERDING,ELIZABETH 08/30/2015, 4:16 AM

## 2015-08-30 NOTE — Progress Notes (Signed)
eLink Physician-Brief Progress Note Patient Name: Frank Davidson DOB: 1951-08-19 MRN: [id number]   Date of Service  08/30/2015  HPI/Events of Note  Patient now NPO and cannot take oral pain meds.  Not relief with current morphine dosing.  eICU Interventions  Plan: Change to dilaudid 1 mg IV q2 hours prn pain     Intervention Category Intermediate Interventions: Pain - evaluation and management  Lilybelle Mayeda 08/30/2015, 1:22 AM

## 2015-08-30 NOTE — Progress Notes (Addendum)
Advanced Heart Failure Rounding Note   Subjective:    Volume status improving with CVVHD. Weight down 25 pounds. CVP now 5-6. Urine output down.   Feels good but c/o R forefoot pain. Much more awake.    ECHO 10/6 EF 25-30% RV ok    Objective:   Weight Range:  Vital Signs:   Temp:  [98.1 F (36.7 C)-98.5 F (36.9 C)] 98.3 F (36.8 C) (10/08 0400) Pulse Rate:  [64-103] 99 (10/08 0800) Resp:  [10-27] 13 (10/08 0800) BP: (87-140)/(34-96) 138/80 mmHg (10/08 0800) SpO2:  [90 %-100 %] 100 % (10/08 0800) Weight:  [98.2 kg (216 lb 7.9 oz)] 98.2 kg (216 lb 7.9 oz) (10/08 0500) Last BM Date: 08/29/15  Weight change: Filed Weights   08/28/15 0500 08/29/15 0600 08/30/15 0500  Weight: 109 kg (240 lb 4.8 oz) 105.8 kg (233 lb 4 oz) 98.2 kg (216 lb 7.9 oz)    Intake/Output:   Intake/Output Summary (Last 24 hours) at 08/30/15 0846 Last data filed at 08/30/15 0800  Gross per 24 hour  Intake 582.47 ml  Output   4949 ml  Net -4366.53 ml     Physical Exam: General: Lying in bed. Resting.  HEENT: normal Neck: supple. JRIJ trialysis cath   Carotids 2+ bilat; no bruits. No lymphadenopathy or thryomegaly appreciated. Cor: PMI nonpalpable. Regular rate & rhythm. No rubs, gallops or murmurs. Lungs: clear Abdomen: obese, soft, nontender, mildly distended. No hepatosplenomegaly. No bruits or masses. Good bowel sounds. Extremities: no cyanosis, clubbing, rash, no edema Neuro: alert & orientedx3, cranial nerves grossly intact. moves all 4 extremities w/o difficulty. Affect pleasant  Telemetry: NSR  Labs: Basic Metabolic Panel:  Recent Labs Lab 08/27/15 1955  08/28/15 0400 08/28/15 1450 08/29/15 0400 08/29/15 0420 08/29/15 1600 08/30/15 0232  NA 124*  < > 126* 132*  134* 136 136 136 139  K 5.3*  < > 3.9 4.4  4.5 4.5 4.5 4.6 4.8  CL 90*  < > 91* 98*  99* 98* 98* 100* 102  CO2 20*  < > 25 28  28 27 29 27 26   GLUCOSE 58*  < > 141* 194*  194* 148* 150* 148* 129*  BUN  85*  < > 49* 30*  30* 15 15 11 8   CREATININE 7.15*  < > 3.74* 2.32*  2.27* 1.45* 1.55* 1.29* 1.42*  CALCIUM 8.6*  < > 8.1* 8.4*  8.4* 8.7* 8.7* 8.8* 8.8*  MG 2.5*  --  2.4 2.4 2.4  --   --  2.3  PHOS  --   < > 4.2 2.8  2.8 2.0* 2.0* 2.3* 2.0*  < > = values in this interval not displayed.  Liver Function Tests:  Recent Labs Lab 08/28/15 0400 08/28/15 1450 08/29/15 0420 08/29/15 1600 08/30/15 0232  AST 28  --   --   --   --   ALT 18  --   --   --   --   ALKPHOS 74  --   --   --   --   BILITOT 0.5  --   --   --   --   PROT 6.8  --   --   --   --   ALBUMIN 3.2* 3.1* 3.1* 3.2* 3.3*   No results for input(s): LIPASE, AMYLASE in the last 168 hours. No results for input(s): AMMONIA in the last 168 hours.  CBC:  Recent Labs Lab 08/27/15 1302 08/28/15 0400 08/29/15 0400 08/30/15 0232  WBC 6.8 7.1 7.9 10.7*  HGB 10.3* 11.4* 11.2* 11.4*  HCT 30.9* 34.6* 34.2* 34.6*  MCV 85.6 85.6 85.9 86.7  PLT 271 290 260 235    Cardiac Enzymes: No results for input(s): CKTOTAL, CKMB, CKMBINDEX, TROPONINI in the last 168 hours.  BNP: BNP (last 3 results)  Recent Labs  07/18/15 1156 08/08/15 1115 08/27/15 1302  BNP 523.3* 805.4* 328.8*    ProBNP (last 3 results)  Recent Labs  10/19/14 2158 10/25/14 1048  PROBNP 1637.0* 3555.0*      Other results:  Imaging: Dg Abd Portable 1v  08/29/2015   CLINICAL DATA:  Abdominal pain, nausea, vomiting  EXAM: PORTABLE ABDOMEN - 1 VIEW  COMPARISON:  None.  FINDINGS: There is no bowel dilatation to suggest obstruction. There is severe gaseous distention of the stomach. There is no evidence of pneumoperitoneum, portal venous gas or pneumatosis. There are no pathologic calcifications along the expected course of the ureters.The osseous structures are unremarkable.  IMPRESSION: Severe gaseous distention of the stomach which may reflect gastric outlet obstruction.   Electronically Signed   By: Elige Ko   On: 08/29/2015 13:35      Medications:     Scheduled Medications: . antiseptic oral rinse  7 mL Mouth Rinse BID  . docusate sodium  100 mg Oral BID  . famotidine (PEPCID) IV  20 mg Intravenous Q24H  . furosemide  160 mg Oral Q6H  . insulin aspart  0-9 Units Subcutaneous TID WC  . sodium chloride  3 mL Intravenous Q12H  . sodium phosphate  Dextrose 5% IVPB  30 mmol Intravenous Once    Infusions: . heparin 10,000 units/ 20 mL infusion syringe 1,250 Units/hr (08/30/15 0528)  . heparin 700 Units/hr (08/30/15 0800)  . dialysis replacement fluid (prismasate) 800 mL/hr at 08/30/15 0828  . dialysis replacement fluid (prismasate) 700 mL/hr at 08/30/15 0843  . dialysate (PRISMASATE) 2,000 mL/hr at 08/30/15 0804    PRN Medications: sodium chloride, albuterol, heparin, heparin, HYDROmorphone (DILAUDID) injection, Influenza vac split quadrivalent PF, ondansetron (ZOFRAN) IV, oxyCODONE-acetaminophen, sodium chloride, sodium chloride   Assessment:   1. A/C Systolic Heart Failure due to NICM EF 25% 2. Cardiorenal syndrome 3. Hyperkalemia- K on admit 6.2  4. AKI/CKD stage IV-V - Baseline creatinine 2.4-2.8 -- Admit creatinine 7.1 5. PAF - on Eliquis at home 6. Obesity  7. DM  8. Ileus 9. Acute delirium likely due to uremia - improved  Plan/Discussion:    Volume status and electrolytes improving.  Given low EF, suspicion high for cardiorenal syndrome but initial co-ox suggests normal cardiac output. May have had transient hypotension leading to ATN.   CVP down. Discussed with Dr. Darrick Penna. Will keep CVVHD going but run even. Continue po lasix. We will see where his renal function lands. Remains on heparin for PAF.   Foot pain atypical for gout but will check uric acid.   We will see again Monday. Please call with questions over the weekend.    Length of Stay: 3   Bensimhon, Daniel MD 08/30/2015, 8:46 AM  Advanced Heart Failure Team Pager [phone] (M-F; 7a - 4p)  Please contact [facility]  for night-coverage after hours (4p -7a ) and weekends on [email or web address]

## 2015-08-30 NOTE — Progress Notes (Addendum)
ANTICOAGULATION CONSULT NOTE - Follow-up  Pharmacy Consult for Heparin Indication: atrial fibrillation with h/o CVA  Allergies  Allergen Reactions  . Corlanor [Ivabradine] Palpitations and Other (See Comments)    Kidney and heart problems, syncope  . Entresto [Sacubitril-Valsartan]     Syncope  . Januvia [Sitagliptin] Other (See Comments)    "Almost killed me"   . Lunesta [Eszopiclone] Palpitations and Other (See Comments)    Kidney and heart problems, syncope  . Actos [Pioglitazone] Other (See Comments)    Caused blood in urine and fluid retention     Patient Measurements: Height: 5\' 10"  (177.8 cm) Weight: 216 lb 7.9 oz (98.2 kg) IBW/kg (Calculated) : 73 Heparin Dosing Weight: 96.5 kg  Vital Signs: Temp: 98.3 F (36.8 C) (10/08 0400) Temp Source: Oral (10/08 0400) BP: 124/73 mmHg (10/08 1100) Pulse Rate: 90 (10/08 1100)  Labs:  Recent Labs  08/27/15 1955  08/28/15 0400  08/29/15 0400 08/29/15 0420 08/29/15 1403 08/29/15 1600 08/29/15 2343 08/30/15 0232 08/30/15 0915  HGB  --   --  11.4*  --  11.2*  --   --   --   --  11.4*  --   HCT  --   --  34.6*  --  34.2*  --   --   --   --  34.6*  --   PLT  --   --  290  --  260  --   --   --   --  235  --   APTT 46*  --  195*  < >  --   --  61*  --  >200*  --  >200*  HEPARINUNFRC 0.73*  --  1.00*  --   --   --   --   --   --  0.93*  --   CREATININE 7.15*  < > 3.74*  < > 1.45* 1.55*  --  1.29*  --  1.42*  --   < > = values in this interval not displayed.  Estimated Creatinine Clearance: 61 mL/min (by C-G formula based on Cr of 1.42).  Assessment:  64 yom on apixaban PTA transitioned to heparin with CRRT starting. F/u aPTT on 700 units/h remains > 200. Heparin level is also elevated at 0.93 and these values seem to correspond indicating that apixaban have been fully cleared. Spoke with tech who collected the level and confirmed it was collected peripherally as heparin infusion is running via central line. No bleed/IV line  issues per RN. Noted, pt also receiving heparin through CRRT    Goal of Therapy:  Heparin level 0.3-0.7 units/ml aPTT 66-102 seconds Monitor platelets by anticoagulation protocol: Yes   Plan:  Hold heparin infusion x 1 hour and restart at 500 units/hr  Note heparin use with CVVHDF also 8h aPTT, Daily aPTT/HL HLs only when closer to normal therapeutic range Monitor s/sx bleeding   Vinnie Level, PharmD., BCPS Clinical Pharmacist Pager [phone]  Addendum: F/u HL is 0.69 and aPTT is 140. The values look like they correlate and at this point, the HL is likely more accurate and reliable. RN reports no s/s of bleeding. Decrease heparin infusion to 450 units/hr and f/u AM HL  Vinnie Level, PharmD., BCPS Clinical Pharmacist Pager [phone]

## 2015-08-30 NOTE — Progress Notes (Signed)
Subjective: Interval History: has no complaint ,stom better.  Objective: Vital signs in last 24 hours: Temp:  [98.1 F (36.7 C)-98.5 F (36.9 C)] 98.3 F (36.8 C) (10/08 0400) Pulse Rate:  [64-103] 98 (10/08 0700) Resp:  [10-27] 18 (10/08 0700) BP: (87-140)/(34-96) 134/96 mmHg (10/08 0700) SpO2:  [90 %-100 %] 100 % (10/08 0700) Weight:  [98.2 kg (216 lb 7.9 oz)] 98.2 kg (216 lb 7.9 oz) (10/08 0500) Weight change: -7.6 kg (-16 lb 12.1 oz)  Intake/Output from previous day: 10/07 0701 - 10/08 0700 In: 583 [I.V.:186.2; IV Piggyback:396.8] Out: 4982 [Urine:800] Intake/Output this shift:    General appearance: cooperative, moderately obese and slowed mentation Neck: RIJ cath Resp: diminished breath sounds bilaterally and rales bibasilar Cardio: S1, S2 normal, systolic murmur: holosystolic 2/6, blowing at apex and many prematures GI: pos bs, soft, obese, nontender Extremities: edema 1+  Lab Results:  Recent Labs  08/29/15 0400 08/30/15 0232  WBC 7.9 10.7*  HGB 11.2* 11.4*  HCT 34.2* 34.6*  PLT 260 235   BMET:  Recent Labs  08/29/15 1600 08/30/15 0232  NA 136 139  K 4.6 4.8  CL 100* 102  CO2 27 26  GLUCOSE 148* 129*  BUN 11 8  CREATININE 1.29* 1.42*  CALCIUM 8.8* 8.8*    Recent Labs  08/27/15 1955  PTH 199*   Iron Studies:  Recent Labs  08/27/15 1955  IRON 63  TIBC 347    Studies/Results: Dg Abd Portable 1v  08/29/2015   CLINICAL DATA:  Abdominal pain, nausea, vomiting  EXAM: PORTABLE ABDOMEN - 1 VIEW  COMPARISON:  None.  FINDINGS: There is no bowel dilatation to suggest obstruction. There is severe gaseous distention of the stomach. There is no evidence of pneumoperitoneum, portal venous gas or pneumatosis. There are no pathologic calcifications along the expected course of the ureters.The osseous structures are unremarkable.  IMPRESSION: Severe gaseous distention of the stomach which may reflect gastric outlet obstruction.   Electronically Signed   By:  Elige Ko   On: 08/29/2015 13:35    I have reviewed the patient's current medications.  Assessment/Plan: 1 AKI getting vol off, improved. Check CVP,  Lower vol off with lower bps.  Acid/base/K ok.  getting Phos.  Not much urine 2 CKD 4 risk factor for nonrecovery 3 DM controlled 4 CVA 5 Toxic encephalopathy improving  Suspect drug 6 Ileus  ? Etiology 7 NICM  Stable 8 polysubstance abuse P lower net neg, check CVPs,  Lasix, phos,     LOS: 3 days   Abuk Selleck L 08/30/2015,7:18 AM

## 2015-08-30 NOTE — Progress Notes (Signed)
This is a social note. We appreciate the excellent care provided by the ICU team, Nephrology, and the Heart Failure Team. We continue to follow along, and are happy to resume care whenever Mr. Szekely is able to be moved out of the ICU.   Devota Pace, MD Family Medicine - PGY 2

## 2015-08-30 NOTE — Progress Notes (Signed)
PULMONARY / CRITICAL CARE MEDICINE   Name: Frank Davidson MRN: [id number] DOB: 04/20/1951    ADMISSION DATE:  08/27/2015 CONSULTATION DATE:  10/5  REFERRING MD :  Bensimhon  CHIEF COMPLAINT:  Dyspnea   INITIAL PRESENTATION: 64yo male with hx COPD, HTN, DM, AFib, CKD III, OSA on CPAP and CHF.  Presented 10/5 to [facility] with SOB and weight gain.  Found to be hypotensive, up from 222 lbs to 242 lbs, K 6.2, Cr 7.1.  Admitted by FPTS, seen in consultation by heart failure team for acute on chronic systolic heart failure and cardiorenal syndrome.  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    STUDIES:  10/6  ECHO >> LV mildly dilated, systolic function severely reduced, EF 25-30%, diffuse hypokinesis, mild MR, LA/RA, RV mildly dilated   SIGNIFICANT EVENTS: 10/5 transfer to the ICU for CVVH. 10/6 20 beat run of NSVT. Remained hemodynamically stable.   SUBJECTIVE:  Pt denied complaints initially, oriented.  During interview the patient experienced projectile vomiting of clear liquids.  Pt denied warning of vomiting.    VITAL SIGNS: Temp:  [98.1 F (36.7 C)-98.5 F (36.9 C)] 98.3 F (36.8 C) (10/08 0400) Pulse Rate:  [64-103] 99 (10/08 0800) Resp:  [10-27] 17 (10/08 0900) BP: (87-140)/(53-96) 123/96 mmHg (10/08 0900) SpO2:  [90 %-100 %] 100 % (10/08 0900) Weight:  [98.2 kg (216 lb 7.9 oz)] 98.2 kg (216 lb 7.9 oz) (10/08 0500)   HEMODYNAMICS: CVP:  [5 mmHg] 5 mmHg   VENTILATOR SETTINGS:     INTAKE / OUTPUT:  Intake/Output Summary (Last 24 hours) at 08/30/15 1000 Last data filed at 08/30/15 0900  Gross per 24 hour  Intake 537.47 ml  Output   4677 ml  Net -4139.53 ml    PHYSICAL EXAMINATION: General:  Chronically ill appearing male in NAD  Neuro:  No gross focal deficits, MAE, speech clear HEENT: Moist mucus membranes. Cardiovascular:  S1, S2 +, No MRG. Lungs:  Clear antr Abdomen:  Soft, NT, ND and +BS. Vomited during exam - clear liquid emesis  Musculoskeletal:   1+ pedal edema bilaterally.  Skin:  Intact.  LABS:  CBC  Recent Labs Lab 08/28/15 0400 08/29/15 0400 08/30/15 0232  WBC 7.1 7.9 10.7*  HGB 11.4* 11.2* 11.4*  HCT 34.6* 34.2* 34.6*  PLT 290 260 235   Coag's  Recent Labs Lab 08/29/15 0230 08/29/15 1403 08/29/15 2343  APTT 159* 61* >200*   BMET  Recent Labs Lab 08/29/15 0420 08/29/15 1600 08/30/15 0232  NA 136 136 139  K 4.5 4.6 4.8  CL 98* 100* 102  CO2 BUN CREATININE 1.55* 1.29* 1.42*  GLUCOSE 150* 148* 129*   Electrolytes  Recent Labs Lab 08/28/15 1450 08/29/15 0400 08/29/15 0420 08/29/15 1600 08/30/15 0232  CALCIUM 8.4*  8.4* 8.7* 8.7* 8.8* 8.8*  MG 2.4 2.4  --   --  2.3  PHOS 2.8  2.8 2.0* 2.0* 2.3* 2.0*   Sepsis Markers No results for input(s): LATICACIDVEN, PROCALCITON, O2SATVEN in the last 168 hours.   ABG No results for input(s): PHART, PCO2ART, PO2ART in the last 168 hours.   Liver Enzymes  Recent Labs Lab 08/28/15 0400  08/29/15 0420 08/29/15 1600 08/30/15 0232  AST 28  --   --   --   --   ALT 18  --   --   --   --   ALKPHOS 74  --   --   --   --  BILITOT 0.5  --   --   --   --   ALBUMIN 3.2*  < > 3.1* 3.2* 3.3*  < > = values in this interval not displayed. Cardiac Enzymes No results for input(s): TROPONINI, PROBNP in the last 168 hours.   Glucose  Recent Labs Lab 08/28/15 2148 08/29/15 0829 08/29/15 1203 08/29/15 1653 08/29/15 2144 08/30/15 0738  GLUCAP 135* 134* 129* 142* 108* 130*    Imaging Dg Abd Portable 1v  08/29/2015   CLINICAL DATA:  Abdominal pain, nausea, vomiting  EXAM: PORTABLE ABDOMEN - 1 VIEW  COMPARISON:  None.  FINDINGS: There is no bowel dilatation to suggest obstruction. There is severe gaseous distention of the stomach. There is no evidence of pneumoperitoneum, portal venous gas or pneumatosis. There are no pathologic calcifications along the expected course of the ureters.The osseous structures are unremarkable.  IMPRESSION:  Severe gaseous distention of the stomach which may reflect gastric outlet obstruction.   Electronically Signed   By: Elige Ko   On: 08/29/2015 13:35     ASSESSMENT / PLAN:  PULMONARY A: Dyspnea - in setting of volume overload / acute systolic CHF COPD  OSA  P:   Nocturnal CPAP for OSA Supplemental O2 as necessary Volume removal as able per renal   CARDIOVASCULAR A: Acute on Chronic Systolic CHF r/t NICM with EF 83% Cardiorenal syndrome  PAF Borderline BP. NSVT  P:  Diuresis/volume removal with CVVHD. Marland Kitchen Heparin anticoagulation for afib.  Continue to hold BB, imdur, hydralazine for now   RENAL A: Acute on CKD - Baseline creatinine 2.4-2.8 -- Admit creatinine 7.1 Hyperkalemia  Hyponatremia  Hypophosphatemia  Cardiorenal Syndrome  P:   Trend BMP  CVVHD per renal running even at this point Replace electrolytes as indicated  Lasix per renal  GASTROINTESTINAL A:   Nausea / Vomiting - new onset 10/7 am P:   PRN zofran for nausea / vomiting  Start renal diet now that N/V is under control  HEMATOLOGIC A: Anemia - mild  P:  F/u CBC  Heparin infusion per pharmacy (AF)  INFECTIOUS A:   No active infections noted. P:   Monitor off abx for now.  ENDOCRINE DM  P:   SSI   NEUROLOGIC A:   Lethargy - resolved P:   Hold all sedating medications. Monitor neuro status PT evaluation OOB to chair  FAMILY: Patient updated bedside.  Hold in the ICU given renal function, hemodynamics and CVVHD needs.  The patient is critically ill with multiple organ systems failure and requires high complexity decision making for assessment and support, frequent evaluation and titration of therapies, application of advanced monitoring technologies and extensive interpretation of multiple databases.   Critical Care Time devoted to patient care services described in this note is  35  Minutes. This time reflects time of care of this signee Dr Koren Bound. This critical care  time does not reflect procedure time, or teaching time or supervisory time of PA/NP/Med student/Med Resident etc but could involve care discussion time.  Alyson Reedy, M.D. [facility] Pulmonary/Critical Care Medicine. Pager: [phone]. After hours pager: [phone].

## 2015-08-31 LAB — HEPARIN LEVEL (UNFRACTIONATED): HEPARIN UNFRACTIONATED: 0.56 [IU]/mL (ref 0.30–0.70)

## 2015-08-31 LAB — GLUCOSE, CAPILLARY
GLUCOSE-CAPILLARY: 107 mg/dL — AB (ref 65–99)
GLUCOSE-CAPILLARY: 150 mg/dL — AB (ref 65–99)
GLUCOSE-CAPILLARY: 108 mg/dL — AB (ref 65–99)
Glucose-Capillary: 124 mg/dL — ABNORMAL HIGH (ref 65–99)

## 2015-08-31 LAB — RENAL FUNCTION PANEL
ANION GAP: 9 (ref 5–15)
Albumin: 3.1 g/dL — ABNORMAL LOW (ref 3.5–5.0)
BUN: 13 mg/dL (ref 6–20)
CHLORIDE: 93 mmol/L — AB (ref 101–111)
CO2: 28 mmol/L (ref 22–32)
CREATININE: 1.8 mg/dL — AB (ref 0.61–1.24)
Calcium: 8.8 mg/dL — ABNORMAL LOW (ref 8.9–10.3)
GFR calc non Af Amer: 38 mL/min — ABNORMAL LOW (ref >60)
GFR, EST AFRICAN AMERICAN: 44 mL/min — AB (ref >60)
Glucose, Bld: 118 mg/dL — ABNORMAL HIGH (ref 65–99)
Phosphorus: 4.2 mg/dL (ref 2.5–4.6)
Potassium: 3.6 mmol/L (ref 3.5–5.1)
Sodium: 130 mmol/L — ABNORMAL LOW (ref 135–145)

## 2015-08-31 LAB — BASIC METABOLIC PANEL
Anion gap: 9 (ref 5–15)
BUN: 7 mg/dL (ref 6–20)
CALCIUM: 8.9 mg/dL (ref 8.9–10.3)
CO2: 28 mmol/L (ref 22–32)
CREATININE: 1.37 mg/dL — AB (ref 0.61–1.24)
Chloride: 97 mmol/L — ABNORMAL LOW (ref 101–111)
GFR, EST NON AFRICAN AMERICAN: 53 mL/min — AB (ref >60)
Glucose, Bld: 119 mg/dL — ABNORMAL HIGH (ref 65–99)
Potassium: 4.1 mmol/L (ref 3.5–5.1)
Sodium: 134 mmol/L — ABNORMAL LOW (ref 135–145)

## 2015-08-31 LAB — CBC
HCT: 33.9 % — ABNORMAL LOW (ref 39.0–52.0)
Hemoglobin: 10.8 g/dL — ABNORMAL LOW (ref 13.0–17.0)
MCH: 27.8 pg (ref 26.0–34.0)
MCHC: 31.9 g/dL (ref 30.0–36.0)
MCV: 87.4 fL (ref 78.0–100.0)
PLATELETS: 237 10*3/uL (ref 150–400)
RBC: 3.88 MIL/uL — ABNORMAL LOW (ref 4.22–5.81)
RDW: 16.7 % — AB (ref 11.5–15.5)
WBC: 12.5 10*3/uL — AB (ref 4.0–10.5)

## 2015-08-31 LAB — PHOSPHORUS: Phosphorus: 2.1 mg/dL — ABNORMAL LOW (ref 2.5–4.6)

## 2015-08-31 LAB — APTT: APTT: 115 s — AB (ref 24–37)

## 2015-08-31 LAB — POCT ACTIVATED CLOTTING TIME
ACTIVATED CLOTTING TIME: 190 s
Activated Clotting Time: 190 seconds

## 2015-08-31 LAB — MAGNESIUM: MAGNESIUM: 2.3 mg/dL (ref 1.7–2.4)

## 2015-08-31 LAB — URIC ACID: URIC ACID, SERUM: 1.2 mg/dL — AB (ref 4.4–7.6)

## 2015-08-31 MED ORDER — DEXTROSE 5 % IV SOLN
30.0000 mmol | Freq: Once | INTRAVENOUS | Status: AC
  Administered 2015-08-31: 30 mmol via INTRAVENOUS
  Filled 2015-08-31: qty 10

## 2015-08-31 NOTE — Progress Notes (Signed)
ANTICOAGULATION CONSULT NOTE - Follow-up  Pharmacy Consult for Heparin Indication: atrial fibrillation with h/o CVA  Allergies  Allergen Reactions  . Corlanor [Ivabradine] Palpitations and Other (See Comments)    Kidney and heart problems, syncope  . Entresto [Sacubitril-Valsartan]     Syncope  . Januvia [Sitagliptin] Other (See Comments)    "Almost killed me"   . Lunesta [Eszopiclone] Palpitations and Other (See Comments)    Kidney and heart problems, syncope  . Actos [Pioglitazone] Other (See Comments)    Caused blood in urine and fluid retention     Patient Measurements: Height: 5\' 10"  (177.8 cm) Weight: 214 lb 1.1 oz (97.1 kg) IBW/kg (Calculated) : 73 Heparin Dosing Weight: 96.5 kg  Vital Signs: Temp: 98.9 F (37.2 C) (10/09 1548) Temp Source: Oral (10/09 1548) BP: 140/91 mmHg (10/09 1800) Pulse Rate: 109 (10/09 1800)  Labs:  Recent Labs  08/29/15 0400  08/30/15 0232 08/30/15 0915 08/30/15 1600 08/30/15 2100 08/31/15 0718 08/31/15 1630  HGB 11.2*  --  11.4*  --   --   --  10.8*  --   HCT 34.2*  --  34.6*  --   --   --  33.9*  --   PLT 260  --  235  --   --   --  237  --   APTT  --   < >  --  >200*  --  140* 115*  --   HEPARINUNFRC  --   < > 0.93*  --   --  0.69 0.56 <0.10*  CREATININE 1.45*  < > 1.42*  --  1.34*  --  1.37* 1.80*  < > = values in this interval not displayed.  Estimated Creatinine Clearance: 47.8 mL/min (by C-G formula based on Cr of 1.8).  Assessment:  64 yom on apixaban PTA for h/o PAF currently on IV heparin at 450 units. Heparin level is now undetectable at <0.1. Spoke with RN and CRRT was stopped shortly before 09:00 and the heparin rate had been 500 units/hr. No bleeding noted.  Goal of Therapy:  Heparin level 0.3-0.7 units/ml Monitor platelets by anticoagulation protocol: Yes   Plan:  Increase heparin drip to 850 units/hr 8 hr heparin level  Monitor daily CBC, heparin level and s/sx bleeding  [facility], [place].D.,  BCPS Clinical Pharmacist Pager: [phone] 08/31/2015 6:31 PM

## 2015-08-31 NOTE — Progress Notes (Signed)
Advanced Heart Failure Rounding Note   Subjective:    Now off CVVHD. Weight down 27 pounds. CVP 10. Urine output picking up. No further n/v.     ECHO 10/6 EF 25-30% RV ok    Objective:   Weight Range:  Vital Signs:   Temp:  [97.8 F (36.6 C)-98.5 F (36.9 C)] 97.8 F (36.6 C) (10/09 0729) Pulse Rate:  [85-115] 115 (10/09 0900) Resp:  [11-22] 18 (10/09 0900) BP: (90-162)/(61-87) 128/68 mmHg (10/09 0900) SpO2:  [97 %-100 %] 100 % (10/09 0900) Weight:  [97.1 kg (214 lb 1.1 oz)] 97.1 kg (214 lb 1.1 oz) (10/09 0500) Last BM Date: 08/30/15  Weight change: Filed Weights   08/29/15 0600 08/30/15 0500 08/31/15 0500  Weight: 105.8 kg (233 lb 4 oz) 98.2 kg (216 lb 7.9 oz) 97.1 kg (214 lb 1.1 oz)    Intake/Output:   Intake/Output Summary (Last 24 hours) at 08/31/15 1109 Last data filed at 08/31/15 0900  Gross per 24 hour  Intake 1120.75 ml  Output   1699 ml  Net -578.25 ml     Physical Exam: General: Lying in bed. Resting.  HEENT: normal Neck: supple. RIJ trialysis cath   Carotids 2+ bilat; no bruits. No lymphadenopathy or thryomegaly appreciated. Cor: PMI nonpalpable. Regular rate & rhythm. No rubs, gallops or murmurs. Lungs: clear Abdomen: obese, soft, nontender, mildly distended. No hepatosplenomegaly. No bruits or masses. Good bowel sounds. Extremities: no cyanosis, clubbing, rash, no edema Neuro: alert & orientedx3, cranial nerves grossly intact. moves all 4 extremities w/o difficulty. Affect pleasant  Telemetry: Sinus tach 100-110  Labs: Basic Metabolic Panel:  Recent Labs Lab 08/28/15 0400 08/28/15 1450 08/29/15 0400 08/29/15 0420 08/29/15 1600 08/30/15 0232 08/30/15 1600 08/31/15 0718  NA 126* 132*  134* 136 136 136 139 133* 134*  K 3.9 4.4  4.5 4.5 4.5 4.6 4.8 4.1 4.1  CL 91* 98*  99* 98* 98* 100* 102 98* 97*  CO2 GLUCOSE 141* 194*  194* 148* 150* 148* 129* 140* 119*  BUN 49* 30*  30* CREATININE 3.74* 2.32*  2.27* 1.45* 1.55* 1.29* 1.42* 1.34* 1.37*  CALCIUM 8.1* 8.4*  8.4* 8.7* 8.7* 8.8* 8.8* 8.8* 8.9  MG 2.4 2.4 2.4  --   --  2.3  --  2.3  PHOS 4.2 2.8  2.8 2.0* 2.0* 2.3* 2.0* 1.9* 2.1*    Liver Function Tests:  Recent Labs Lab 08/28/15 0400 08/28/15 1450 08/29/15 0420 08/29/15 1600 08/30/15 0232 08/30/15 1600  AST 28  --   --   --   --   --   ALT 18  --   --   --   --   --   ALKPHOS 74  --   --   --   --   --   BILITOT 0.5  --   --   --   --   --   PROT 6.8  --   --   --   --   --   ALBUMIN 3.2* 3.1* 3.1* 3.2* 3.3* 3.2*   No results for input(s): LIPASE, AMYLASE in the last 168 hours. No results for input(s): AMMONIA in the last 168 hours.  CBC:  Recent Labs Lab 08/27/15 1302 08/28/15 0400 08/29/15 0400 08/30/15 0232 08/31/15 0718  WBC 6.8 7.1 7.9 10.7* 12.5*  HGB 10.3* 11.4* 11.2* 11.4* 10.8*  HCT 30.9* 34.6* 34.2* 34.6* 33.9*  MCV 85.6 85.6 85.9 86.7 87.4  PLT 271 290 260 235 237    Cardiac Enzymes: No results for input(s): CKTOTAL, CKMB, CKMBINDEX, TROPONINI in the last 168 hours.  BNP: BNP (last 3 results)  Recent Labs  07/18/15 1156 08/08/15 1115 08/27/15 1302  BNP 523.3* 805.4* 328.8*    ProBNP (last 3 results)  Recent Labs  10/19/14 2158 10/25/14 1048  PROBNP 1637.0* 3555.0*      Other results:  Imaging: Dg Abd Portable 1v  08/29/2015   CLINICAL DATA:  Abdominal pain, nausea, vomiting  EXAM: PORTABLE ABDOMEN - 1 VIEW  COMPARISON:  None.  FINDINGS: There is no bowel dilatation to suggest obstruction. There is severe gaseous distention of the stomach. There is no evidence of pneumoperitoneum, portal venous gas or pneumatosis. There are no pathologic calcifications along the expected course of the ureters.The osseous structures are unremarkable.  IMPRESSION: Severe gaseous distention of the stomach which may reflect gastric outlet obstruction.   Electronically Signed   By: Elige Ko   On: 08/29/2015 13:35      Medications:     Scheduled Medications: . antiseptic oral rinse  7 mL Mouth Rinse BID  . docusate sodium  100 mg Oral BID  . famotidine (PEPCID) IV  20 mg Intravenous Q24H  . furosemide  160 mg Oral Q6H  . insulin aspart  0-9 Units Subcutaneous TID WC  . sodium chloride  3 mL Intravenous Q12H  . sodium phosphate  Dextrose 5% IVPB  30 mmol Intravenous Once    Infusions: . heparin 10,000 units/ 20 mL infusion syringe 500 Units/hr (08/31/15 0725)  . heparin 450 Units/hr (08/31/15 0800)  . dialysis replacement fluid (prismasate) 800 mL/hr at 08/31/15 0346  . dialysis replacement fluid (prismasate) 700 mL/hr at 08/31/15 0644  . dialysate (PRISMASATE) 2,000 mL/hr at 08/31/15 0647    PRN Medications: sodium chloride, albuterol, heparin, heparin, HYDROmorphone (DILAUDID) injection, Influenza vac split quadrivalent PF, ondansetron (ZOFRAN) IV, oxyCODONE-acetaminophen, sodium chloride, sodium chloride   Assessment:   1. A/C Systolic Heart Failure due to NICM EF 25% 2. Cardiorenal syndrome 3. Hyperkalemia- K on admit 6.2  4. AKI/CKD stage IV-V - Baseline creatinine 2.4-2.8 -- Admit creatinine 7.1 5. PAF - on Eliquis at home 6. Obesity  7. DM  8. Ileus 9. Acute delirium likely due to uremia - improved  Plan/Discussion:    Volume status and electrolytes improved  Given low EF, suspicion high for cardiorenal syndrome but initial co-ox suggests normal cardiac output. May have had transient hypotension leading to ATN or toxin related (was taking herbal supplement)  Now off CVVHD. Urine output improving.  We will see where his renal function lands. Remains on heparin for PAF.  Appreciate Renal care.     Length of Stay: 4   Loyce Klasen MD 08/31/2015, 11:09 AM  Advanced Heart Failure Team Pager [phone] (M-F; 7a - 4p)  Please contact [facility] for night-coverage after hours (4p -7a ) and weekends on [email or web address]

## 2015-08-31 NOTE — Progress Notes (Signed)
ANTICOAGULATION CONSULT NOTE - Follow-up  Pharmacy Consult for Heparin Indication: atrial fibrillation with h/o CVA  Allergies  Allergen Reactions  . Corlanor [Ivabradine] Palpitations and Other (See Comments)    Kidney and heart problems, syncope  . Entresto [Sacubitril-Valsartan]     Syncope  . Januvia [Sitagliptin] Other (See Comments)    "Almost killed me"   . Lunesta [Eszopiclone] Palpitations and Other (See Comments)    Kidney and heart problems, syncope  . Actos [Pioglitazone] Other (See Comments)    Caused blood in urine and fluid retention     Patient Measurements: Height: 5\' 10"  (177.8 cm) Weight: 214 lb 1.1 oz (97.1 kg) IBW/kg (Calculated) : 73 Heparin Dosing Weight: 96.5 kg  Vital Signs: Temp: 97.8 F (36.6 C) (10/09 0729) Temp Source: Oral (10/09 0729) BP: 128/68 mmHg (10/09 0900) Pulse Rate: 115 (10/09 0900)  Labs:  Recent Labs  08/29/15 0400  08/30/15 0232 08/30/15 0915 08/30/15 1600 08/30/15 2100 08/31/15 0718  HGB 11.2*  --  11.4*  --   --   --  10.8*  HCT 34.2*  --  34.6*  --   --   --  33.9*  PLT 260  --  235  --   --   --  237  APTT  --   < >  --  >200*  --  140* 115*  HEPARINUNFRC  --   --  0.93*  --   --  0.69 0.56  CREATININE 1.45*  < > 1.42*  --  1.34*  --  1.37*  < > = values in this interval not displayed.  Estimated Creatinine Clearance: 62.8 mL/min (by C-G formula based on Cr of 1.37).  Assessment:  64 yom on apixaban PTA for h/o PAF currently on IV heparin at 450 units. HL this AM is therapeutic at 0.56. H/H low, plt wnl. No bleed/IV line issues per RN. Noted, pt also receiving heparin through CRRT   Goal of Therapy:  Heparin level 0.3-0.7 units/ml Monitor platelets by anticoagulation protocol: Yes   Plan:  Continue IV heparin at 450 units/hr F/u 8 hr HL Monitor daily CBC, HL and s/sx bleeding Likely stopping CRRT soon    Vinnie Level, PharmD., BCPS Clinical Pharmacist Pager [phone]

## 2015-08-31 NOTE — Progress Notes (Signed)
This is a social note. We appreciate the excellent care provided by the ICU team, Nephrology, and the Heart Failure Team. We continue to follow along, and are happy to resume care whenever Mr. Kinsman is able to be moved out of the ICU.   Candelaria Stagers, MD Family Medicine - PGY 1

## 2015-08-31 NOTE — Progress Notes (Signed)
Subjective: Interval History: has no complaint , no more N, V.  Objective: Vital signs in last 24 hours: Temp:  [97.8 F (36.6 C)-98.5 F (36.9 C)] 97.8 F (36.6 C) (10/09 0729) Pulse Rate:  [85-118] 95 (10/09 0700) Resp:  [11-22] 14 (10/09 0700) BP: (90-145)/(61-96) 144/84 mmHg (10/09 0700) SpO2:  [97 %-100 %] 100 % (10/09 0700) Weight:  [97.1 kg (214 lb 1.1 oz)] 97.1 kg (214 lb 1.1 oz) (10/09 0500) Weight change: -1.1 kg (-2 lb 6.8 oz)  Intake/Output from previous day: 10/08 0701 - 10/09 0700 In: 1304.8 [P.O.:1000; I.V.:125.8; IV Piggyback:179] Out: 2066 [Urine:1115] Intake/Output this shift:    General appearance: alert, no distress and morbidly obese Neck: RIJ cath Resp: diminished breath sounds bilaterally and rales bibasilar Cardio: S1, S2 normal and systolic murmur: holosystolic 2/6, blowing at apex GI: obese, pos bs, soft Extremities: edema 2-3+ pre sacral  Lab Results:  Recent Labs  08/29/15 0400 08/30/15 0232  WBC 7.9 10.7*  HGB 11.2* 11.4*  HCT 34.2* 34.6*  PLT 260 235   BMET:  Recent Labs  08/30/15 0232 08/30/15 1600  NA 139 133*  K 4.8 4.1  CL 102 98*  CO2 26 28  GLUCOSE 129* 140*  BUN 8 8  CREATININE 1.42* 1.34*  CALCIUM 8.8* 8.8*   No results for input(s): PTH in the last 72 hours. Iron Studies: No results for input(s): IRON, TIBC, TRANSFERRIN, FERRITIN in the last 72 hours.  Studies/Results: Dg Abd Portable 1v  08/29/2015   CLINICAL DATA:  Abdominal pain, nausea, vomiting  EXAM: PORTABLE ABDOMEN - 1 VIEW  COMPARISON:  None.  FINDINGS: There is no bowel dilatation to suggest obstruction. There is severe gaseous distention of the stomach. There is no evidence of pneumoperitoneum, portal venous gas or pneumatosis. There are no pathologic calcifications along the expected course of the ureters.The osseous structures are unremarkable.  IMPRESSION: Severe gaseous distention of the stomach which may reflect gastric outlet obstruction.    Electronically Signed   By: Elige Ko   On: 08/29/2015 13:35    I have reviewed the patient's current medications.  Assessment/Plan: 1 AKI good solute, acid/base/K.  Vol xs but better. .  Will try off later today.  Suspect insult making worse over baseline was toxin or cardiac 2 CKD 4  Not sure how much function will regain. . Will follow .need to keep neg balance 3 CM per cards 4 Obesity 5 DM controlled 6 Polysubstance abuse 7 CVA in past  8 Gout 9 COPD P stop CRRT later, follow vol, solute, limit meds   LOS: 4 days   Morgen Ritacco L 08/31/2015,8:06 AM

## 2015-08-31 NOTE — Progress Notes (Signed)
PULMONARY / CRITICAL CARE MEDICINE   Name: Frank Davidson MRN: [id number] DOB: Mar 31, 1951    ADMISSION DATE:  08/27/2015 CONSULTATION DATE:  10/5  REFERRING MD :  Bensimhon  CHIEF COMPLAINT:  Dyspnea   INITIAL PRESENTATION: 64yo male with hx COPD, HTN, DM, AFib, CKD III, OSA on CPAP and CHF.  Presented 10/5 to [facility] with SOB and weight gain.  Found to be hypotensive, up from 222 lbs to 242 lbs, K 6.2, Cr 7.1.  Admitted by FPTS, seen in consultation by heart failure team for acute on chronic systolic heart failure and cardiorenal syndrome.  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    STUDIES:  10/6  ECHO >> LV mildly dilated, systolic function severely reduced, EF 25-30%, diffuse hypokinesis, mild MR, LA/RA, RV mildly dilated   SIGNIFICANT EVENTS: 10/5 transfer to the ICU for CVVH. 10/6 20 beat run of NSVT. Remained hemodynamically stable.   SUBJECTIVE:  No events overnight, remains on CVVH.  No further N/V, tolerating renal diet.  VITAL SIGNS: Temp:  [97.8 F (36.6 C)-98.5 F (36.9 C)] 97.8 F (36.6 C) (10/09 0729) Pulse Rate:  [85-118] 95 (10/09 0700) Resp:  [11-22] 14 (10/09 0700) BP: (90-145)/(61-96) 144/84 mmHg (10/09 0700) SpO2:  [97 %-100 %] 100 % (10/09 0700) Weight:  [97.1 kg (214 lb 1.1 oz)] 97.1 kg (214 lb 1.1 oz) (10/09 0500)   HEMODYNAMICS: CVP:  [8 mmHg-9 mmHg] 9 mmHg   VENTILATOR SETTINGS:     INTAKE / OUTPUT:  Intake/Output Summary (Last 24 hours) at 08/31/15 0809 Last data filed at 08/31/15 0700  Gross per 24 hour  Intake 1254.75 ml  Output   1924 ml  Net -669.25 ml   PHYSICAL EXAMINATION: General:  Chronically ill appearing male in NAD  Neuro:  No gross focal deficits, MAE, speech clear HEENT: Moist mucus membranes. Cardiovascular:  S1, S2 +, No MRG. Lungs:  Clear antr Abdomen:  Soft, NT, ND and +BS. Musculoskeletal:  1+ pedal edema bilaterally.  Skin:  Intact.  LABS:  CBC  Recent Labs Lab 08/29/15 0400 08/30/15 0232  08/31/15 0718  WBC 7.9 10.7* 12.5*  HGB 11.2* 11.4* 10.8*  HCT 34.2* 34.6* 33.9*  PLT 260 235 237   Coag's  Recent Labs Lab 08/29/15 2343 08/30/15 0915 08/30/15 2100  APTT >200* >200* 140*   BMET  Recent Labs Lab 08/29/15 1600 08/30/15 0232 08/30/15 1600  NA 136 139 133*  K 4.6 4.8 4.1  CL 100* 102 98*  CO2 BUN CREATININE 1.29* 1.42* 1.34*  GLUCOSE 148* 129* 140*   Electrolytes  Recent Labs Lab 08/28/15 1450 08/29/15 0400  08/29/15 1600 08/30/15 0232 08/30/15 1600  CALCIUM 8.4*  8.4* 8.7*  < > 8.8* 8.8* 8.8*  MG 2.4 2.4  --   --  2.3  --   PHOS 2.8  2.8 2.0*  < > 2.3* 2.0* 1.9*  < > = values in this interval not displayed. Sepsis Markers No results for input(s): LATICACIDVEN, PROCALCITON, O2SATVEN in the last 168 hours.   ABG No results for input(s): PHART, PCO2ART, PO2ART in the last 168 hours.   Liver Enzymes  Recent Labs Lab 08/28/15 0400  08/29/15 1600 08/30/15 0232 08/30/15 1600  AST 28  --   --   --   --   ALT 18  --   --   --   --   ALKPHOS 74  --   --   --   --  BILITOT 0.5  --   --   --   --   ALBUMIN 3.2*  < > 3.2* 3.3* 3.2*  < > = values in this interval not displayed. Cardiac Enzymes No results for input(s): TROPONINI, PROBNP in the last 168 hours.   Glucose  Recent Labs Lab 08/29/15 1653 08/29/15 2144 08/30/15 0738 08/30/15 1203 08/30/15 1558 08/30/15 2124  GLUCAP 142* 108* 130* 132* 128* 115*    Imaging No results found.   ASSESSMENT / PLAN:  PULMONARY A: Dyspnea - in setting of volume overload / acute systolic CHF COPD  OSA  P:   Nocturnal CPAP for OSA. Supplemental O2 as necessary. Monitor for airway protection.  CARDIOVASCULAR A: Acute on Chronic Systolic CHF r/t NICM with EF 22% Cardiorenal syndrome  PAF Borderline BP. NSVT  P:  CVVHD even at this point.  Will d/c after this filter expires. Heparin anticoagulation for afib.  Continue to hold BB, imdur, hydralazine for now    RENAL A: Acute on CKD - Baseline creatinine 2.4-2.8 -- Admit creatinine 7.1 Hyperkalemia  Hyponatremia  Hypophosphatemia  Cardiorenal Syndrome  P:   Trend BMP. CVVHD per renal running even at this point but will d/c after this filter expires. Replace electrolytes as indicated  Lasix per renal.  GASTROINTESTINAL A:   Nausea / Vomiting - new onset 10/7 am P:   PRN zofran for nausea / vomiting  Renal diet.  HEMATOLOGIC A: Anemia - mild  P:  F/u CBC  Heparin infusion per pharmacy (AF)  INFECTIOUS A:   No active infections noted. P:   Monitor off abx for now.  ENDOCRINE DM  P:   SSI   NEUROLOGIC A:   Lethargy - resolved P:   Hold all sedating medications. Dilaudid PRN for pain. Monitor neuro status. PT evaluation. OOB to chair.  FAMILY: Patient updated bedside.  Hold in the ICU given renal function, hemodynamics and CVVHD needs.  The patient is critically ill with multiple organ systems failure and requires high complexity decision making for assessment and support, frequent evaluation and titration of therapies, application of advanced monitoring technologies and extensive interpretation of multiple databases.   Critical Care Time devoted to patient care services described in this note is  35  Minutes. This time reflects time of care of this signee Dr Koren Bound. This critical care time does not reflect procedure time, or teaching time or supervisory time of PA/NP/Med student/Med Resident etc but could involve care discussion time.  Alyson Reedy, M.D. [facility] Pulmonary/Critical Care Medicine. Pager: [phone]. After hours pager: [phone].

## 2015-08-31 NOTE — Evaluation (Signed)
Physical Therapy Evaluation Patient Details Name: Frank Davidson MRN: [id number] DOB: 1951/02/19 Today's Date: 08/31/2015   History of Present Illness  Adm 10/5 with acute on chronic CHF; cardiorenal syndrome; 10/5-10/9 on CVVH PMHx-DM, Afib, CHF, COPD, CVA with Lt sided weakness; arthritis  Clinical Impression  Pt admitted with above diagnoses. Pt currently with functional limitations due to the deficits listed below (see PT Problem List). Patient reports he was independent prior to admission (exercised 3x/wk by walking 1 mile inside [employer]). Pt will benefit from skilled PT to increase their independence and safety with mobility to allow discharge to the venue listed below.       Follow Up Recommendations CIR (to achieve independent status prior to d/c home)    Equipment Recommendations  None recommended by PT    Recommendations for Other Services OT consult     Precautions / Restrictions Precautions Precautions: Fall      Mobility  Bed Mobility Overal bed mobility: Needs Assistance Bed Mobility: Supine to Sit;Sit to Supine     Supine to sit: Min guard;HOB elevated Sit to supine: Supervision   General bed mobility comments: +rail with HOB elevated; supervision for lines  Transfers                 General transfer comment: deferred as just back to bed with RN  Ambulation/Gait                Stairs            Wheelchair Mobility    Modified Rankin (Stroke Patients Only)       Balance Overall balance assessment: Needs assistance Sitting-balance support: Bilateral upper extremity supported;Feet unsupported Sitting balance-Leahy Scale: Poor                                       Pertinent Vitals/Pain HR 94-118 with bed exercises; frequent PVCs SaO2 100%; RR 15-28  Pain Assessment: No/denies pain    Home Living Family/patient expects to be discharged to:: Private residence Living Arrangements: Other relatives  (neice) Available Help at Discharge: Family;Available PRN/intermittently           Home Equipment: Walker - 2 wheels;Cane - single point      Prior Function Level of Independence: Independent         Comments: had DME after prior ortho surgery; has not been using recently     Hand Dominance        Extremity/Trunk Assessment   Upper Extremity Assessment: Generalized weakness           Lower Extremity Assessment: Generalized weakness      Cervical / Trunk Assessment: Other exceptions  Communication   Communication: No difficulties  Cognition Arousal/Alertness: Awake/alert Behavior During Therapy: Restless;Impulsive (climbing to sit at EOB from supine on arrival) Overall Cognitive Status: Impaired/Different from baseline                      General Comments      Exercises Low Level/ICU Exercises Ankle Circles/Pumps: AROM;Both;10 reps Short Arc Quad: AROM;Strengthening;Both;10 reps Stabilized Bridging: AROM;Both;5 reps (bolsters under knees and lifting hips) Shoulder Flexion: AROM;Both (3 reps with dyspnea) Elbow Flexion: Strengthening;Right;Left (1 rep with incr dyspnea and PVCs)      Assessment/Plan    PT Assessment Patient needs continued PT services  PT Diagnosis Generalized weakness   PT Problem List Decreased strength;Decreased activity  tolerance;Decreased balance;Decreased mobility;Decreased cognition;Decreased knowledge of use of DME;Decreased safety awareness;Cardiopulmonary status limiting activity;Obesity  PT Treatment Interventions DME instruction;Gait training;Stair training;Functional mobility training;Therapeutic activities;Therapeutic exercise;Balance training;Cognitive remediation;Patient/family education   PT Goals (Current goals can be found in the Care Plan section) Acute Rehab PT Goals Patient Stated Goal: regain strength PT Goal Formulation: With patient Time For Goal Achievement: 09/14/15 Potential to Achieve Goals:  Good    Frequency Min 3X/week   Barriers to discharge Decreased caregiver support      Co-evaluation               End of Session   Activity Tolerance: Patient limited by fatigue Patient left: in bed;with call bell/phone within reach;with bed alarm set Nurse Communication: Other (comment) (found climbing OOB)         Time: 1610-9604 PT Time Calculation (min) (ACUTE ONLY): 20 min   Charges:   PT Evaluation $Initial PT Evaluation Tier I: 1 Procedure     PT G Codes:        Lavere Stork 09-10-2015, 4:25 PM Pager [phone]

## 2015-09-01 LAB — CULTURE, BLOOD (ROUTINE X 2)
CULTURE: NO GROWTH
CULTURE: NO GROWTH

## 2015-09-01 LAB — RENAL FUNCTION PANEL
ALBUMIN: 3.3 g/dL — AB (ref 3.5–5.0)
ANION GAP: 12 (ref 5–15)
BUN: 23 mg/dL — AB (ref 6–20)
CALCIUM: 9.2 mg/dL (ref 8.9–10.3)
CO2: 27 mmol/L (ref 22–32)
CREATININE: 2.11 mg/dL — AB (ref 0.61–1.24)
Chloride: 90 mmol/L — ABNORMAL LOW (ref 101–111)
GFR calc Af Amer: 36 mL/min — ABNORMAL LOW (ref >60)
GFR calc non Af Amer: 31 mL/min — ABNORMAL LOW (ref >60)
GLUCOSE: 116 mg/dL — AB (ref 65–99)
PHOSPHORUS: 3.6 mg/dL (ref 2.5–4.6)
Potassium: 3.5 mmol/L (ref 3.5–5.1)
SODIUM: 129 mmol/L — AB (ref 135–145)

## 2015-09-01 LAB — PHOSPHORUS: PHOSPHORUS: 3.5 mg/dL (ref 2.5–4.6)

## 2015-09-01 LAB — COMPREHENSIVE METABOLIC PANEL
ALK PHOS: 63 U/L (ref 38–126)
ALT: 16 U/L — ABNORMAL LOW (ref 17–63)
ANION GAP: 12 (ref 5–15)
AST: 27 U/L (ref 15–41)
Albumin: 3.1 g/dL — ABNORMAL LOW (ref 3.5–5.0)
BUN: 18 mg/dL (ref 6–20)
CALCIUM: 9.5 mg/dL (ref 8.9–10.3)
CO2: 28 mmol/L (ref 22–32)
Chloride: 93 mmol/L — ABNORMAL LOW (ref 101–111)
Creatinine, Ser: 1.9 mg/dL — ABNORMAL HIGH (ref 0.61–1.24)
GFR, EST AFRICAN AMERICAN: 41 mL/min — AB (ref >60)
GFR, EST NON AFRICAN AMERICAN: 35 mL/min — AB (ref >60)
Glucose, Bld: 117 mg/dL — ABNORMAL HIGH (ref 65–99)
Potassium: 3.4 mmol/L — ABNORMAL LOW (ref 3.5–5.1)
SODIUM: 133 mmol/L — AB (ref 135–145)
Total Bilirubin: 0.7 mg/dL (ref 0.3–1.2)
Total Protein: 6.9 g/dL (ref 6.5–8.1)

## 2015-09-01 LAB — CBC
HEMATOCRIT: 32.6 % — AB (ref 39.0–52.0)
Hemoglobin: 10.7 g/dL — ABNORMAL LOW (ref 13.0–17.0)
MCH: 28.5 pg (ref 26.0–34.0)
MCHC: 32.8 g/dL (ref 30.0–36.0)
MCV: 86.7 fL (ref 78.0–100.0)
Platelets: 233 10*3/uL (ref 150–400)
RBC: 3.76 MIL/uL — ABNORMAL LOW (ref 4.22–5.81)
RDW: 16.9 % — AB (ref 11.5–15.5)
WBC: 11.9 10*3/uL — ABNORMAL HIGH (ref 4.0–10.5)

## 2015-09-01 LAB — GLUCOSE, CAPILLARY
GLUCOSE-CAPILLARY: 132 mg/dL — AB (ref 65–99)
GLUCOSE-CAPILLARY: 123 mg/dL — AB (ref 65–99)
GLUCOSE-CAPILLARY: 124 mg/dL — AB (ref 65–99)
Glucose-Capillary: 106 mg/dL — ABNORMAL HIGH (ref 65–99)

## 2015-09-01 LAB — HEPARIN LEVEL (UNFRACTIONATED): Heparin Unfractionated: 0.15 IU/mL — ABNORMAL LOW (ref 0.30–0.70)

## 2015-09-01 LAB — MAGNESIUM: MAGNESIUM: 1.8 mg/dL (ref 1.7–2.4)

## 2015-09-01 MED ORDER — MAGNESIUM SULFATE IN D5W 10-5 MG/ML-% IV SOLN: 1.0000 g | Freq: Once | INTRAVENOUS | Status: DC

## 2015-09-01 MED ORDER — POTASSIUM CHLORIDE CRYS ER 20 MEQ PO TBCR
40.0000 meq | EXTENDED_RELEASE_TABLET | Freq: Once | ORAL | Status: AC
  Administered 2015-09-01: 40 meq via ORAL
  Filled 2015-09-01: qty 2

## 2015-09-01 MED ORDER — FAMOTIDINE 20 MG PO TABS
20.0000 mg | ORAL_TABLET | Freq: Every day | ORAL | Status: DC
  Administered 2015-09-01 – 2015-09-02 (×2): 20 mg via ORAL
  Filled 2015-09-01 (×2): qty 1

## 2015-09-01 MED ORDER — MAGNESIUM SULFATE 2 GM/50ML IV SOLN
2.0000 g | Freq: Once | INTRAVENOUS | Status: AC
  Administered 2015-09-01: 2 g via INTRAVENOUS
  Filled 2015-09-01: qty 50

## 2015-09-01 NOTE — Progress Notes (Signed)
Pt has progressed well with therapy today and now [facility] is recommended. Not in need of an inpt rehab admission at this functional level at this time. [phone]

## 2015-09-01 NOTE — Progress Notes (Signed)
ANTICOAGULATION CONSULT NOTE - Follow Up Consult  Pharmacy Consult for heparin Indication: atrial fibrillation   Labs:  Recent Labs  08/30/15 0232 08/30/15 0915  08/30/15 2100 08/31/15 0718 08/31/15 1630 09/01/15 0314 09/01/15 1425  HGB 11.4*  --   --   --  10.8*  --  10.7*  --   HCT 34.6*  --   --   --  33.9*  --  32.6*  --   PLT 235  --   --   --  237  --  233  --   APTT  --  >200*  --  140* 115*  --   --   --   HEPARINUNFRC 0.93*  --   --  0.69 0.56 <0.10* <0.10* 0.15*  CREATININE 1.42*  --   < >  --  1.37* 1.80* 1.90*  --   < > = values in this interval not displayed.   Assessment: 64yo male remains subtherapeutic on heparin after rate increase to 1100 units/hr; pt had been getting heparin w/ CRRT, now off. No bleeding noted per nursing.  If scr stable overnight may restart DOAC tomorrow.  Goal of Therapy:  Heparin level 0.3-0.7 units/ml   Plan:  Will increase heparin gtt by 3 units/kg/hr to 1400 units/hr and check level in 8hr.  Sheppard Coil PharmD., BCPS Clinical Pharmacist Pager [phone] 09/01/2015 3:34 PM

## 2015-09-01 NOTE — Care Management Important Message (Signed)
Important Message  Patient Details  Name: Frank Davidson MRN: [id number] Date of Birth: 12/29/1950   Medicare Important Message Given:  Yes-second notification given    Kyla Balzarine 09/01/2015, 11:23 AM

## 2015-09-01 NOTE — Progress Notes (Signed)
Advanced Heart Failure Rounding Note   Subjective:    Now off CVVHD. Weight stable overnight (Down 27 pounds since admit) . Creatinine relatively stable at 1.8-1.9. CVP 10. Urine output excellent.  Breathing better. No further nausea. + left hip pain (chronic)  ECHO 10/6 EF 25-30% RV ok    Objective:   Weight Range:  Vital Signs:   Temp:  [98.4 F (36.9 C)-98.9 F (37.2 C)] 98.7 F (37.1 C) (10/10 0320) Pulse Rate:  [76-115] 106 (10/10 0800) Resp:  [13-26] 21 (10/10 0800) BP: (109-140)/(66-96) 131/79 mmHg (10/10 0800) SpO2:  [95 %-100 %] 98 % (10/10 0800) Weight:  [97.3 kg (214 lb 8.1 oz)] 97.3 kg (214 lb 8.1 oz) (10/10 0500) Last BM Date: 08/31/15  Weight change: Filed Weights   08/30/15 0500 08/31/15 0500 09/01/15 0500  Weight: 98.2 kg (216 lb 7.9 oz) 97.1 kg (214 lb 1.1 oz) 97.3 kg (214 lb 8.1 oz)    Intake/Output:   Intake/Output Summary (Last 24 hours) at 09/01/15 0812 Last data filed at 09/01/15 0800  Gross per 24 hour  Intake 1862.29 ml  Output   3476 ml  Net -1613.71 ml     Physical Exam: General: Lying in bed. Resting.  HEENT: normal Neck: supple. RIJ trialysis cath   Carotids 2+ bilat; no bruits. No lymphadenopathy or thryomegaly appreciated. Cor: PMI nonpalpable. Regular rate & rhythm. No rubs, gallops or murmurs. Lungs: clear Abdomen: obese, soft, nontender, mildly distended. No hepatosplenomegaly. No bruits or masses. Good bowel sounds. Extremities: no cyanosis, clubbing, rash, no edema Neuro: alert & orientedx3, cranial nerves grossly intact. moves all 4 extremities w/o difficulty. Affect pleasant  Telemetry: Sinus tach 100-110  Labs: Basic Metabolic Panel:  Recent Labs Lab 08/28/15 1450 08/29/15 0400  08/30/15 0232 08/30/15 1600 08/31/15 0718 08/31/15 1630 09/01/15 0314  NA 132*  134* 136  < > 139 133* 134* 130* 133*  K 4.4  4.5 4.5  < > 4.8 4.1 4.1 3.6 3.4*  CL 98*  99* 98*  < > 102 98* 97* 93* 93*  CO2 < >  GLUCOSE 194*  194* 148*  < > 129* 140* 119* 118* 117*  BUN 30*  30* 15  < > CREATININE 2.32*  2.27* 1.45*  < > 1.42* 1.34* 1.37* 1.80* 1.90*  CALCIUM 8.4*  8.4* 8.7*  < > 8.8* 8.8* 8.9 8.8* 9.5  MG 2.4 2.4  --  2.3  --  2.3  --  1.8  PHOS 2.8  2.8 2.0*  < > 2.0* 1.9* 2.1* 4.2 3.5  < > = values in this interval not displayed.  Liver Function Tests:  Recent Labs Lab 08/28/15 0400  08/29/15 1600 08/30/15 0232 08/30/15 1600 08/31/15 1630 09/01/15 0314  AST 28  --   --   --   --   --  27  ALT 18  --   --   --   --   --  16*  ALKPHOS 74  --   --   --   --   --  63  BILITOT 0.5  --   --   --   --   --  0.7  PROT 6.8  --   --   --   --   --  6.9  ALBUMIN 3.2*  < > 3.2* 3.3* 3.2* 3.1* 3.1*  < > = values in this  interval not displayed. No results for input(s): LIPASE, AMYLASE in the last 168 hours. No results for input(s): AMMONIA in the last 168 hours.  CBC:  Recent Labs Lab 08/28/15 0400 08/29/15 0400 08/30/15 0232 08/31/15 0718 09/01/15 0314  WBC 7.1 7.9 10.7* 12.5* 11.9*  HGB 11.4* 11.2* 11.4* 10.8* 10.7*  HCT 34.6* 34.2* 34.6* 33.9* 32.6*  MCV 85.6 85.9 86.7 87.4 86.7  PLT 290 260 235 237 233    Cardiac Enzymes: No results for input(s): CKTOTAL, CKMB, CKMBINDEX, TROPONINI in the last 168 hours.  BNP: BNP (last 3 results)  Recent Labs  07/18/15 1156 08/08/15 1115 08/27/15 1302  BNP 523.3* 805.4* 328.8*    ProBNP (last 3 results)  Recent Labs  10/19/14 2158 10/25/14 1048  PROBNP 1637.0* 3555.0*      Other results:  Imaging: No results found.   Medications:     Scheduled Medications: . antiseptic oral rinse  7 mL Mouth Rinse BID  . docusate sodium  100 mg Oral BID  . famotidine (PEPCID) IV  20 mg Intravenous Q24H  . furosemide  160 mg Oral Q6H  . insulin aspart  0-9 Units Subcutaneous TID WC  . sodium chloride  3 mL Intravenous Q12H    Infusions: . heparin 1,100 Units/hr (09/01/15 0800)    PRN  Medications: sodium chloride, albuterol, HYDROmorphone (DILAUDID) injection, Influenza vac split quadrivalent PF, ondansetron (ZOFRAN) IV, oxyCODONE-acetaminophen, sodium chloride   Assessment:   1. A/C Systolic Heart Failure due to NICM EF 25% 2. Cardiorenal syndrome 3. Hyperkalemia- K on admit 6.2  4. AKI/CKD stage IV-V - Baseline creatinine 2.4-2.8 -- Admit creatinine 7.1 5. PAF - on Eliquis at home 6. Obesity  7. DM  8. Ileus 9. Acute delirium likely due to uremia - improved  Plan/Discussion:    Volume status and electrolytes improved  Given low EF, suspicion high for cardiorenal syndrome but initial co-ox suggests normal cardiac output. May have had transient hypotension leading to ATN or toxin related (was taking herbal supplement)  Now off CVVHD. Urine output much improved now and weight stable. CVP 3. Will stop lasix. Can likely switch back to home diuretic regimen (torsemide 40 bid) starting tomorrow - will await Renal input.  Remains on heparin for PAF. If renal function remains stable can switch back to Xarelto tomorrow.     Length of Stay: 5   Shiasia Porro MD 09/01/2015, 8:12 AM  Advanced Heart Failure Team Pager [phone] (M-F; 7a - 4p)  Please contact [facility] for night-coverage after hours (4p -7a ) and weekends on [email or web address]

## 2015-09-01 NOTE — Plan of Care (Signed)
Problem: Phase II Progression Outcomes Goal: Pain controlled Outcome: Completed/Met Date Met:  09/01/15 Treating chronic Lt hip pain with Percocet 2-3 times a day with relief

## 2015-09-01 NOTE — Care Management Note (Signed)
Case Management Note  Patient Details  Name: Frank Davidson MRN: [id number] Date of Birth: 1951-09-07  Subjective/Objective:    Pt lives with niece, sister, Gavin Pound, lives nearby.  She reports that niece works but she will be available to provide assistance and prepare meals during the day.  States pt has had home health physical therapy in the past and hopes he will be able to discharge home with the same service when medically stable.                             Expected Discharge Plan:  Home w Home Health Services  Discharge planning Services  CM Consult  Status of Service:  In process, will continue to follow  Magdalene River, RN 09/01/2015, 10:14 AM

## 2015-09-01 NOTE — Progress Notes (Signed)
Pt transferred to 3E via w/c dentures and glasses with pt. Handed off to RN 3e

## 2015-09-01 NOTE — Progress Notes (Signed)
Rehab Admissions Coordinator Note:  Patient was screened by Clois Dupes for appropriateness for an Inpatient Acute Rehab Consult per PT recommendation.  At this time, we are recommending await further progress with therapy before determining rehab venue needs.Clois Dupes 09/01/2015, 8:01 AM  I can be reached at [phone].

## 2015-09-01 NOTE — Progress Notes (Signed)
Frank Davidson trasferred to 3E6 from [employer] - arrived in wheelchair with Heparin infusing via R forearm at 11 ml/hr via pump and Magnesium bolus @ 25 ml/hr via R IJ central line catheter.  IV team consult requested to d/c and flush R IJ after Magnesium bolus infused. Placed on telemetry box # 25 and CCMD called to confirm.   Foley intact to gravity drainage.  Patient has allergies and red arm band applied.  High fall risk and has on yellow socks - yellow armband applied and bed alarm set.  Patient had glasses, dentures (soaking in denture cup,  cell phone with charger and clothes for personal items.  Clothes placed in closet and cell phone with charger and glasses placed on overbed table.  Dentures placed at sink. Patient denies pain.  Skin warm and dry.  Shown call bell and telephone and staff numbers on white boards in room.

## 2015-09-01 NOTE — Discharge Summary (Signed)
Family [facility] Discharge Summary  Patient name: Frank Davidson Medical record number: [id number] Date of birth: 06/14/1951 Age: 64 y.o. Gender: male Date of Admission: 08/27/2015  Date of Discharge: 09/03/2015 Admitting Physician: Latrelle Dodrill, MD  Primary Care Provider: Abbe Amsterdam, MD Consultants: nephrology, cardiology, CMM  Indication for Hospitalization: SOB and weight gain due to fluid overload  Discharge Diagnoses/Problem List:  Patient Active Problem List   Diagnosis Date Noted  . Acute on chronic systolic congestive heart failure (HCC)   . Type 2 diabetes mellitus with stage 4 chronic kidney disease, without long-term current use of insulin (HCC)   . Nausea & vomiting   . Acute on chronic systolic CHF (congestive heart failure) (HCC)   . AKI (acute kidney injury) (HCC)   . Acute kidney injury (HCC) 08/27/2015  . Arterial hypotension   . Hip pain 07/23/2015  . Acute exacerbation of CHF (congestive heart failure) (HCC)   . Dyspnea   . Acute renal failure superimposed on stage 3 chronic kidney disease (HCC) 07/01/2015  . Herpes ocular   . Acute on chronic systolic heart failure (HCC) 06/19/2015  . Facial rash 06/19/2015  . Diabetes mellitus type 2, controlled (HCC) 06/19/2015  . Essential hypertension 06/19/2015  . Chronic anemia 06/19/2015  . CHF (congestive heart failure) (HCC) 06/19/2015  . CHF exacerbation (HCC) 06/18/2015  . Non-ischemic cardiomyopathy- EF 15% echo Aug 2015 11/24/2014  . Chronic anticoagulation 11/24/2014  . S/P left THA, AA 09/12/2014  . Gout 04/29/2014  . Ocular herpes   . Neuropathy (HCC)   . OSA (obstructive sleep apnea) 07/09/2013  . Chronic systolic heart failure (HCC) 07/05/2013  . 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 PAF (paroxysmal atrial fibrillation) (HCC) 07/03/2013  . Hypertension   . COPD (chronic obstructive pulmonary disease) (HCC)   . Diabetes mellitus with nephropathy   . CKD  (chronic kidney disease), stage III   . Polysubstance abuse      Disposition: home  Discharge Condition: stable  Discharge Exam: Gen: sitting in chair, NAD, well-appearing  Oropharynx: clear, moist Neck: no JVD CV: RRR. S1 & S2, 2/6 SEM over RUSB Resp: no apparent WOB, diminished air movement on the right, CTAB. Abd: +BS. Soft, NDNT, no rebound or guarding.  Ext: No edema or gross deformities. Neuro: Alert and oriented, No gross focal deficits  Brief Hospital Course: Frank Davidson is 64yo male with hx COPD, HTN, DM, AFib, CKD III, OSA on CPAP and CHF. Presented 10/5 with SOB and weight gain over two weeks that has acutely gotten worse over two days. Found to be hypotensive. Weight up from 222 lbs to 242 lbs, K 6.2, Cr 7.1 (baseline 2.4-2.8). BUN 86. He was briefly admitted to [facility]PTS and transferred to ICU for CVVHD. Patient reports using herbal diuretics called "kidney cleanse" for two weeks before presentation.  In ICU patient had Central Venous Trialysis Catheter placed on 08/27/2015 and recieved CVVHD (10/5-10/9). Over the course, weight down 27 lbs, creatinine down to 1.8, BUN down to 13, K down to 3.6. His breathing and urine output improved significantly. He was observed off CVVHD on 10/10 and transferred to floor on 10/11. On floor, patient remained stable.   AoCKD - resolved .  Creatinine down from 7.1 on admission to 1.8 after CVVHD.  Baseline 2.4-2.8. Creatinine 2.29 on discharge. Patient had good urine output. Discharged home on torsemide 40 mg twice daily. Recommend renal panel at follow up.  Dyspnea from volume overload- resolved. Likely  secondary to acute on CKD and acute on chronic systolic CHF vs. COPD. Discharged home on Coreg 12.5 mg twice daily and torsemide 40 mg twice daily. Recommend reviewing his medication and adjust as needed.  Acute on Chronic Systolic CHF due to NICM. Echo with EF 25-30%, diffuse hypokinesis, mild to mod dilatation of all chambers. Valves  normal. On coreg 12.5 mg bid, hydralazine 50 mg tid, torsemide 40 mg bid, spiro 12.5 dialy, metolazone 5 mg prn at home. All held while in ICU. Coreg restarted at very low dose and titrated up to 12.5 mg twice daily prior to discharge. Recommend fluid status assessment and medication adjustment at follow up.  PAF: received heparin drip while in house. Discharged on home Apixaban 5 mg.  HTN: normotensive. Held his home medications for the majority of his hospitalization. Discharged on Coreg 12.5 mg twice daily and torsemide 40 mg twice daily. Recommend BP check and medication adjustment as needed.  DM-2: A1c 5.9 in 12/2014. Blood glucoses within normal range without medications. Held his glipizide while in house. Can restate his medication as needed at follow up.  Other chronic conditions stable.  Issues for Follow Up:  1. Renal function test 2. We made a huge change to his medications on discharge. PCP can make adjustments as needed  3. Follow up with renal in three weeks. Please, call and schedule appointment. 4. Discuss about herbal and OTC medications use as they could be dangerous given his complex medical history. 5. Negotiate CPAP use for his OSA as this could improve many of his medical conditions.  Significant Procedures:  1. Insertion of Central Venous Trialysis Catheter 08/27/2015  Significant Labs and Imaging:   Recent Labs Lab 09/01/15 0314 09/02/15 1155 09/03/15 0134  WBC 11.9* 11.2* 9.8  HGB 10.7* 11.6* 11.9*  HCT 32.6* 34.8* 35.5*  PLT 233 275 240    Recent Labs Lab 08/28/15 0400  08/29/15 0400  08/30/15 0232 08/30/15 1600 08/31/15 0718 08/31/15 1630 09/01/15 0314 09/01/15 1600 09/02/15 0005 09/02/15 1155 09/02/15 1529 09/03/15 0134  NA 126*  < > 136  < > 139 133* 134* 130* 133* 129*  --  129* 132* 129*  K 3.9  < > 4.5  < > 4.8 4.1 4.1 3.6 3.4* 3.5  --  3.3* 3.4* 3.9  CL 91*  < > 98*  < > 102 98* 97* 93* 93* 90*  --  89* 92* 91*  CO2 25  < > 27  < > --  GLUCOSE 141*  < > 148*  < > 129* 140* 119* 118* 117* 116*  --  109* 123* 112*  BUN 49*  < > 15  < > 23*  --  31* 33* 36*  CREATININE 3.74*  < > 1.45*  < > 1.42* 1.34* 1.37* 1.80* 1.90* 2.11*  --  2.28* 2.19* 2.29*  CALCIUM 8.1*  < > 8.7*  < > 8.8* 8.8* 8.9 8.8* 9.5 9.2  --  9.4 9.5 9.7  MG 2.4  < > 2.4  --  2.3  --  2.3  --  1.8  --  1.9  --   --   --   PHOS 4.2  < > 2.0*  < > 2.0* 1.9* 2.1* 4.2 3.5 3.6  --   --  3.9  --   ALKPHOS 74  --   --   --   --   --   --   --  63  --   --   --   --   --   AST 28  --   --   --   --   --   --   --  27  --   --   --   --   --   ALT 18  --   --   --   --   --   --   --  16*  --   --   --   --   --   ALBUMIN 3.2*  < >  --   < > 3.3* 3.2*  --  3.1* 3.1* 3.3*  --   --  3.0*  --   < > = values in this interval not displayed.   Results/Tests Pending at Time of Discharge: none  Discharge Medications:    Medication List    STOP taking these medications        ALPHA LIPOIC ACID PO     b complex vitamins tablet     CoQ10 100 MG Caps     glipiZIDE 5 MG tablet  Commonly known as:  GLUCOTROL     HAWTHORN EXTRACT PO     hydrALAZINE 50 MG tablet  Commonly known as:  APRESOLINE     isosorbide mononitrate 60 MG 24 hr tablet  Commonly known as:  IMDUR     metolazone 5 MG tablet  Commonly known as:  ZAROXOLYN     spironolactone 25 MG tablet  Commonly known as:  ALDACTONE     VITAMIN C PO     VITAMIN E PO      TAKE these medications        albuterol 108 (90 BASE) MCG/ACT inhaler  Commonly known as:  PROVENTIL HFA;VENTOLIN HFA  Inhale 2 puffs into the lungs every 6 (six) hours as needed for wheezing or shortness of breath.     allopurinol 100 MG tablet  Commonly known as:  ZYLOPRIM  Take 100 mg by mouth daily.     apixaban 5 MG Tabs tablet  Commonly known as:  ELIQUIS  Take 1 tablet (5 mg total) by mouth 2 (two) times daily.     baclofen 10 MG tablet  Commonly known as:  LIORESAL  TAKE 1  TABLET EVERY 8 HOURS AS NEEDED FOR MUSCLE SPASM(S)     carvedilol 12.5 MG tablet  Commonly known as:  COREG  Take 1 tablet (12.5 mg total) by mouth 2 (two) times daily with a meal.     diclofenac 1.3 % Ptch  Commonly known as:  FLECTOR  Place 1 patch onto the skin 2 (two) times daily as needed (pain).     DSS 100 MG Caps  Take 100 mg by mouth 2 (two) times daily.     omega-3 acid ethyl esters 1 G capsule  Commonly known as:  LOVAZA  Take 1 g by mouth daily.     oxyCODONE-acetaminophen 10-325 MG tablet  Commonly known as:  PERCOCET  Take 1 tablet by mouth 4 (four) times daily. scheduled     polyethylene glycol powder powder  Commonly known as:  GLYCOLAX/MIRALAX  MIX 1 CAPFUL (17GM)  IN  LIQUID  AND  DRINK EVERY DAY AS DIRECTED     potassium chloride SA 20 MEQ tablet  Commonly known as:  K-DUR,KLOR-CON  Take 1 tablet (20 mEq total) by mouth daily.     prednisoLONE acetate 1 % ophthalmic suspension  Commonly known  as:  PRED FORTE  Place 1 drop into both eyes 4 (four) times daily.     torsemide 20 MG tablet  Commonly known as:  DEMADEX  TAKE 2 TABLETS BY MOUTH 2 TIMES DAILY        Discharge Instructions: Please refer to Patient Instructions section of EMR for full details.  Patient was counseled important signs and symptoms that should prompt return to medical care, changes in medications, dietary instructions, activity restrictions, and follow up appointments.   Follow-Up Appointments: Follow-up Information    Follow up with [facility].   Specialty:  Home Health Services   Why:  They will do your home health care at your home   Contact information:   [address] [place] [address] [phone]       Follow up with COPLAND,JESSICA, MD In 8 days.   Specialty:  Family Medicine   Why:  4 pm   Contact information:   [address] [place] [address] [phone]       Follow up with Arvilla Meres, MD On 09/15/2015.    Specialty:  Cardiology   Why:  at 0920 am for post hospital follow up.  Please bring all of your medications to your visit.  The patient parking code is 0900.   Contact information:   [address] [place] [address] [phone]       Follow up with Abbe Amsterdam, MD. Go in 1 day.   Specialty:  Family Medicine   Why:  between 2 pm and 6pm   Contact information:   [address] [place] [address] [phone]       Almon Hercules, MD 09/03/2015, 7:12 PM PGY-1, [facility]

## 2015-09-01 NOTE — Progress Notes (Signed)
Lab called @ [phone] to relay that [name] has 12:30 pm Heparin level due and he was transferred from [address] [place] to 3E16.

## 2015-09-01 NOTE — Progress Notes (Signed)
ANTICOAGULATION CONSULT NOTE - Follow Up Consult  Pharmacy Consult for heparin Indication: atrial fibrillation   Labs:  Recent Labs  08/30/15 0232 08/30/15 0915  08/30/15 2100 08/31/15 0718 08/31/15 1630 09/01/15 0314  HGB 11.4*  --   --   --  10.8*  --  10.7*  HCT 34.6*  --   --   --  33.9*  --  32.6*  PLT 235  --   --   --  237  --  233  APTT  --  >200*  --  140* 115*  --   --   HEPARINUNFRC 0.93*  --   --  0.69 0.56 <0.10* <0.10*  CREATININE 1.42*  --   < >  --  1.37* 1.80* 1.90*  < > = values in this interval not displayed.   Assessment: 64yo male remains subtherapeutic on heparin after rate increase; pt had been getting heparin w/ CRRT, now off.  Goal of Therapy:  Heparin level 0.3-0.7 units/ml   Plan:  Will increase heparin gtt by 3 units/kg/hr to 1100 units/hr (prior to CRRT PTT was high at rates higher than this) and check level in 8hr.  Vernard Gambles, PharmD, BCPS  09/01/2015,4:42 AM

## 2015-09-01 NOTE — Progress Notes (Signed)
Patient ID: Frank Davidson, male   DOB: August 05, 1951, 64 y.o.   MRN: [id number] S:feels well O:BP 131/79 mmHg  Pulse 106  Temp(Src) 98.6 F (37 C) (Oral)  Resp 21  Ht  (1.778 m)  Wt 97.3 kg (214 lb 8.1 oz)  BMI 30.78 kg/m2  SpO2 98%  Intake/Output Summary (Last 24 hours) at 09/01/15 0855 Last data filed at 09/01/15 0800  Gross per 24 hour  Intake 1862.29 ml  Output   3476 ml  Net -1613.71 ml   Intake/Output: I/O last 3 completed shifts: In: 2071.5 [P.O.:1520; I.V.:241.5; IV Piggyback:310] Out: 4009 [Urine:3970; Other:38; Stool:1]  Intake/Output this shift:  Total I/O In: 251 [P.O.:240; I.V.:11] Out: 400 [Urine:400] Weight change: 0.2 kg (7.1 oz) Gen:WD AAM in NAD CVS:no rub, tachycardic Resp:cta WUJ:WJXBJY NWG:NFAOZHY pretibial edema   Recent Labs Lab 08/28/15 0400 08/28/15 1450  08/29/15 0420 08/29/15 1600 08/30/15 0232 08/30/15 1600 08/31/15 0718 08/31/15 1630 09/01/15 0314  NA 126* 132*  134*  < > 136 136 139 133* 134* 130* 133*  K 3.9 4.4  4.5  < > 4.5 4.6 4.8 4.1 4.1 3.6 3.4*  CL 91* 98*  99*  < > 98* 100* 102 98* 97* 93* 93*  CO2 < > GLUCOSE 141* 194*  194*  < > 150* 148* 129* 140* 119* 118* 117*  BUN 49* 30*  30*  < > CREATININE 3.74* 2.32*  2.27*  < > 1.55* 1.29* 1.42* 1.34* 1.37* 1.80* 1.90*  ALBUMIN 3.2* 3.1*  --  3.1* 3.2* 3.3* 3.2*  --  3.1* 3.1*  CALCIUM 8.1* 8.4*  8.4*  < > 8.7* 8.8* 8.8* 8.8* 8.9 8.8* 9.5  PHOS 4.2 2.8  2.8  < > 2.0* 2.3* 2.0* 1.9* 2.1* 4.2 3.5  AST 28  --   --   --   --   --   --   --   --  27  ALT 18  --   --   --   --   --   --   --   --  16*  < > = values in this interval not displayed. Liver Function Tests:  Recent Labs Lab 08/28/15 0400  08/30/15 1600 08/31/15 1630 09/01/15 0314  AST 28  --   --   --  27  ALT 18  --   --   --  16*  ALKPHOS 74  --   --   --  63  BILITOT 0.5  --   --   --  0.7  PROT 6.8  --   --   --  6.9  ALBUMIN 3.2*  < > 3.2*  3.1* 3.1*  < > = values in this interval not displayed. No results for input(s): LIPASE, AMYLASE in the last 168 hours. No results for input(s): AMMONIA in the last 168 hours. CBC:  Recent Labs Lab 08/28/15 0400 08/29/15 0400 08/30/15 0232 08/31/15 0718 09/01/15 0314  WBC 7.1 7.9 10.7* 12.5* 11.9*  HGB 11.4* 11.2* 11.4* 10.8* 10.7*  HCT 34.6* 34.2* 34.6* 33.9* 32.6*  MCV 85.6 85.9 86.7 87.4 86.7  PLT 290 260 235 237 233   Cardiac Enzymes: No results for input(s): CKTOTAL, CKMB, CKMBINDEX, TROPONINI in the last 168 hours. CBG:  Recent Labs Lab 08/30/15 2124 08/31/15 0726 08/31/15 1129 08/31/15 1638 08/31/15 2128  GLUCAP 115* 107* 150* 108*  124*    Iron Studies: No results for input(s): IRON, TIBC, TRANSFERRIN, FERRITIN in the last 72 hours. Studies/Results: No results found. Marland Kitchen antiseptic oral rinse  7 mL Mouth Rinse BID  . docusate sodium  100 mg Oral BID  . famotidine (PEPCID) IV  20 mg Intravenous Q24H  . insulin aspart  0-9 Units Subcutaneous TID WC  . sodium chloride  3 mL Intravenous Q12H    BMET    Component Value Date/Time   NA 133* 09/01/2015 0314   K 3.4* 09/01/2015 0314   CL 93* 09/01/2015 0314   CO2 28 09/01/2015 0314   GLUCOSE 117* 09/01/2015 0314   BUN 18 09/01/2015 0314   CREATININE 1.90* 09/01/2015 0314   CREATININE 4.61* 06/30/2015 1618   CALCIUM 9.5 09/01/2015 0314   GFRNONAA 35* 09/01/2015 0314   GFRNONAA 20* 06/14/2015 1405   GFRAA 41* 09/01/2015 0314   GFRAA 24* 06/14/2015 1405   CBC    Component Value Date/Time   WBC 11.9* 09/01/2015 0314   WBC 7.1 06/14/2015 1427   RBC 3.76* 09/01/2015 0314   RBC 4.73 06/14/2015 1427   HGB 10.7* 09/01/2015 0314   HGB 12.8* 06/14/2015 1427   HCT 32.6* 09/01/2015 0314   HCT 41.0* 06/14/2015 1427   PLT 233 09/01/2015 0314   MCV 86.7 09/01/2015 0314   MCV 86.7 06/14/2015 1427   MCH 28.5 09/01/2015 0314   MCH 27.1 06/14/2015 1427   MCHC 32.8 09/01/2015 0314   MCHC 31.3* 06/14/2015 1427    RDW 16.9* 09/01/2015 0314   LYMPHSABS 1.8 07/18/2015 1156   MONOABS 0.8 07/18/2015 1156   EOSABS 0.2 07/18/2015 1156   BASOSABS 0.1 07/18/2015 1156    Assessment/Plan:  1. AKI- possibly toxin related or due to cardiorenal syndrome.  Off of CVVHD and is non-oliguric.  No indication to resume HD for now.  Will continue to follow UOP and electrolytes.  Will d/c foley catheter. 2. Acute on chronic systolic heart failure (NICMP EF 25%). Resume diuretics in am per cardiology 3. PAF- on eliquis 4. Obesity 5. Diabetes mellitus 6. Polysubstance abuse- cocaine and marijuana 7. AMS- likely metabolic encephalopathy.  Improved after CVVHD  Keyandre Pileggi A

## 2015-09-01 NOTE — Plan of Care (Signed)
Problem: Phase II Progression Outcomes Goal: Discharge plan established Outcome: Completed/Met Date Met:  09/01/15 Lives with niece and patient plans to discharge back to same arrangements

## 2015-09-01 NOTE — Progress Notes (Signed)
Report given to Margaretmary Bayley RN 3E

## 2015-09-01 NOTE — Progress Notes (Signed)
Physical Therapy Treatment Patient Details Name: Frank Davidson MRN: [id number] DOB: Dec 18, 1950 Today's Date: 09/01/2015    History of Present Illness Adm 10/5 with acute on chronic CHF; cardiorenal syndrome; 10/5-10/9 on CVVH PMHx-DM, Afib, CHF, COPD, CVA with Lt sided weakness; arthritis.    PT Comments    Patient progressing well towards PT goals. Tolerated gait training today with Min A for balance/safety. To note: pt with abnormal increase in HR during mobility to 165 bpm. Pt asymptomatic. RN notified. Pt motivated to return home. Anticipate with increased mobility, pt will improve and will be able to return home with 24/7 supervision and HHPT vs OPPT. Will follow acutely to maximize independence and mobility prior to return home.  Follow Up Recommendations  Home health PT;Supervision/Assistance - 24 hour     Equipment Recommendations  Other (comment) (TBD.)    Recommendations for Other Services OT consult     Precautions / Restrictions Precautions Precautions: Fall Precaution Comments: elevated HR    Mobility  Bed Mobility Overal bed mobility: Needs Assistance Bed Mobility: Supine to Sit;Sit to Supine     Supine to sit: Min guard;HOB elevated Sit to supine: Supervision   General bed mobility comments: Min guard for safety. Use of rails for support.  Transfers Overall transfer level: Needs assistance Equipment used: Rolling walker (2 wheeled) Transfers: Sit to/from Stand Sit to Stand: Min guard         General transfer comment: Min guard for safety. Stood from [employer], from chair x1.  Ambulation/Gait Ambulation/Gait assistance: Min assist Ambulation Distance (Feet): 125 Feet Assistive device: Rolling walker (2 wheeled) Gait Pattern/deviations: Step-through pattern;Decreased stride length   Gait velocity interpretation: <1.8 ft/sec, indicative of risk for recurrent falls General Gait Details: Slow, steady gait. HR increased to 165 bpm resulting in forced  seated rest break. RN notified.    Stairs            Wheelchair Mobility    Modified Rankin (Stroke Patients Only)       Balance Overall balance assessment: Needs assistance Sitting-balance support: Feet supported;No upper extremity supported Sitting balance-Leahy Scale: Good     Standing balance support: During functional activity Standing balance-Leahy Scale: Poor Standing balance comment: Relient on RW for support.                    Cognition Arousal/Alertness: Awake/alert Behavior During Therapy: WFL for tasks assessed/performed Overall Cognitive Status: Within Functional Limits for tasks assessed                      Exercises General Exercises - Lower Extremity Long Arc Quad: Both;5 reps;Seated    General Comments General comments (skin integrity, edema, etc.): Family present during session- sister and niece.      Pertinent Vitals/Pain Pain Assessment: Faces Faces Pain Scale: Hurts a little bit Pain Location: joints Pain Descriptors / Indicators: Aching Pain Intervention(s): Monitored during session;Repositioned    Home Living                      Prior Function            PT Goals (current goals can now be found in the care plan section) Progress towards PT goals: Progressing toward goals    Frequency  Min 3X/week    PT Plan Discharge plan needs to be updated    Co-evaluation             End of Session Equipment  Utilized During Treatment: Gait belt Activity Tolerance: Patient tolerated treatment well;Treatment limited secondary to medical complications (Comment) (abnormal elevated HR.) Patient left: in bed;with call bell/phone within reach;with bed alarm set;with family/visitor present     Time: 0981-1914 PT Time Calculation (min) (ACUTE ONLY): 23 min  Charges:  $Gait Training: 8-22 mins $Therapeutic Exercise: 8-22 mins                    G Codes:      Tamikka Pilger A Teague Goynes 09/01/2015, 3:34 PM   Mylo Red, PT, DPT [phone]

## 2015-09-01 NOTE — Progress Notes (Addendum)
PULMONARY / CRITICAL CARE MEDICINE   Name: Frank Davidson MRN: [id number] DOB: 1951/03/03    ADMISSION DATE:  08/27/2015 CONSULTATION DATE:  10/5  REFERRING MD :  Bensimhon  CHIEF COMPLAINT:  Dyspnea   INITIAL PRESENTATION: 64yo male with hx COPD, HTN, DM, AFib, CKD III, OSA on CPAP and CHF.  Presented 10/[facility] with SOB and weight gain.  Found to be hypotensive, up from 222 lbs to 242 lbs, K 6.2, Cr 7.1.  Admitted by FPTS, seen in consultation by heart failure team for acute on chronic systolic heart failure and cardiorenal syndrome.  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    STUDIES:  10/6  ECHO >> LV mildly dilated, systolic function severely reduced, EF 25-30%, diffuse hypokinesis, mild MR, LA/RA, RV mildly dilated   SIGNIFICANT EVENTS: 10/5 transfer to the ICU for CVVH. 10/6 20 beat run of NSVT. Remained hemodynamically stable.  08/31/15:  No events overnight, remains on CVVH.  No further N/V, tolerating renal diet.   SUBJECTIVE/OVERNIGHT/INTERVAL HX 09/01/2015  = -per RN discussion on rounds - no overngiht issues. Last CVVH yesterday. PVC runs +.On heparin gtt for A Fib. Meets floor transfer criteria per rN. Moved from toilet to bed and needed assist in my presence and got some winded  VITAL SIGNS: Temp:  [98.4 F (36.9 C)-98.9 F (37.2 C)] 98.6 F (37 C) (10/10 0800) Pulse Rate:  [76-115] 106 (10/10 0800) Resp:  [13-26] 21 (10/10 0800) BP: (109-140)/(66-96) 131/79 mmHg (10/10 0800) SpO2:  [95 %-100 %] 98 % (10/10 0800) Weight:  [97.3 kg (214 lb 8.1 oz)] 97.3 kg (214 lb 8.1 oz) (10/10 0500)   HEMODYNAMICS: CVP:  [4 mmHg] 4 mmHg   VENTILATOR SETTINGS:     INTAKE / OUTPUT:  Intake/Output Summary (Last 24 hours) at 09/01/15 0856 Last data filed at 09/01/15 0800  Gross per 24 hour  Intake 1862.29 ml  Output   3476 ml  Net -1613.71 ml   PHYSICAL EXAMINATION: General:  Chronically ill appearing male in NAD  Neuro:  No gross focal deficits, MAE,  speech clear HEENT: Moist mucus membranes. Cardiovascular:  S1, S2 +, No MRG. Lungs:  Clear antr and coarse posteriorly Abdomen:  Soft, NT, ND and +BS. Musculoskeletal:  1+ pedal edema bilaterally.  Skin:  Intact.  LABS:  PULMONARY  Recent Labs Lab 08/27/15 1754  O2SAT 65.4    CBC  Recent Labs Lab 08/30/15 0232 08/31/15 0718 09/01/15 0314  HGB 11.4* 10.8* 10.7*  HCT 34.6* 33.9* 32.6*  WBC 10.7* 12.5* 11.9*  PLT 235 237 233    COAGULATION No results for input(s): INR in the last 168 hours.  CARDIAC  No results for input(s): TROPONINI in the last 168 hours. No results for input(s): PROBNP in the last 168 hours.   CHEMISTRY  Recent Labs Lab 08/28/15 1450 08/29/15 0400  08/30/15 0232 08/30/15 1600 08/31/15 0718 08/31/15 1630 09/01/15 0314  NA 132*  134* 136  < > 139 133* 134* 130* 133*  K 4.4  4.5 4.5  < > 4.8 4.1 4.1 3.6 3.4*  CL 98*  99* 98*  < > 102 98* 97* 93* 93*  CO2 28  28 27   < > 26 28 28 28 28   GLUCOSE 194*  194* 148*  < > 129* 140* 119* 118* 117*  BUN 30*  30* 15  < > 8 8 7 13 18   CREATININE 2.32*  2.27* 1.45*  < > 1.42* 1.34* 1.37*  1.80* 1.90*  CALCIUM 8.4*  8.4* 8.7*  < > 8.8* 8.8* 8.9 8.8* 9.5  MG 2.4 2.4  --  2.3  --  2.3  --  1.8  PHOS 2.8  2.8 2.0*  < > 2.0* 1.9* 2.1* 4.2 3.5  < > = values in this interval not displayed. Estimated Creatinine Clearance: 45.3 mL/min (by C-G formula based on Cr of 1.9).   LIVER  Recent Labs Lab 08/28/15 0400  08/29/15 1600 08/30/15 0232 08/30/15 1600 08/31/15 1630 09/01/15 0314  AST 28  --   --   --   --   --  27  ALT 18  --   --   --   --   --  16*  ALKPHOS 74  --   --   --   --   --  63  BILITOT 0.5  --   --   --   --   --  0.7  PROT 6.8  --   --   --   --   --  6.9  ALBUMIN 3.2*  < > 3.2* 3.3* 3.2* 3.1* 3.1*  < > = values in this interval not displayed.   INFECTIOUS No results for input(s): LATICACIDVEN, PROCALCITON in the last 168 hours.   ENDOCRINE CBG (last 3)   Recent  Labs  08/31/15 1129 08/31/15 1638 08/31/15 2128  GLUCAP 150* 108* 124*         IMAGING x48h  - image(s) personally visualized  -   highlighted in bold No results found.  cxr 08/27/15 visualized personally - looks clear to me    ASSESSMENT / PLAN:  PULMONARY A: Dyspnea - in setting of volume overload / acute systolic CHF COPD  OSA   - has class 3 dyspnea . Some of this is due to deconditioning   P:   Nocturnal CPAP for OSA. Supplemental O2 as necessary. Monitor for airway protection.  CARDIOVASCULAR A: Acute on Chronic Systolic CHF r/t NICM with EF 81% Cardiorenal syndrome  PAF Borderline BP. NSVT    - not on pressors  P:  Per cards  Heparin anticoagulation for afib.     RENAL A: Acute on CKD - Baseline creatinine 2.4-2.8 -- Admit creatinine 7.1 Hyperkalemia  Hyponatremia  Hypophosphatemia  Cardiorenal Syndrome    - makgin urine, creat 1.9mg % and holding. Last CVVH 08/31/15./ Mild hypomag < 2gm% 09/01/2015 and hypokalemia   P:   kcl x1  2gm mag sulfate replacement 09/01/2015 Rest Per renal  GASTROINTESTINAL A:   Nausea / Vomiting - new onset 10/7 am   - none 09/01/2015  P:   PRN zofran for nausea / vomiting  Renal diet.  HEMATOLOGIC A: Anemia - mild  P:  F/u CBC  Heparin infusion per pharmacy (AF)  INFECTIOUS A:   No active infections noted. P:   Monitor off abx for now.  ENDOCRINE DM  P:   SSI   NEUROLOGIC A:   Lethargy - resolved   - some chronic pain issues P:   Hold all sedating medications. Dilaudid PRN for pain. Monitor neuro status.  GEN A: Deconditioned  PT evaluation. OOB to chair.  FAMILY: x   Move to cardiac tele 09/01/2015. PCCM off and FPTS will be primary from 09/02/15 - d/w Dr Rogelio Seen of FPTS    Dr. Kalman Shan, M.D., [facility].C.P Pulmonary and Critical Care Medicine Staff Physician [facility] Pager: [phone], If no answer or  between  15:00h - 7:00h: call [phone]  09/01/2015 9:05 AM

## 2015-09-02 DIAGNOSIS — I10 Essential (primary) hypertension: Secondary | ICD-10-CM

## 2015-09-02 DIAGNOSIS — N184 Chronic kidney disease, stage 4 (severe): Secondary | ICD-10-CM

## 2015-09-02 DIAGNOSIS — E1122 Type 2 diabetes mellitus with diabetic chronic kidney disease: Secondary | ICD-10-CM | POA: Insufficient documentation

## 2015-09-02 LAB — GLUCOSE, CAPILLARY
GLUCOSE-CAPILLARY: 105 mg/dL — AB (ref 65–99)
GLUCOSE-CAPILLARY: 106 mg/dL — AB (ref 65–99)
Glucose-Capillary: 118 mg/dL — ABNORMAL HIGH (ref 65–99)
Glucose-Capillary: 115 mg/dL — ABNORMAL HIGH (ref 65–99)

## 2015-09-02 LAB — CBC
HEMATOCRIT: 34.8 % — AB (ref 39.0–52.0)
HEMOGLOBIN: 11.6 g/dL — AB (ref 13.0–17.0)
MCH: 28.4 pg (ref 26.0–34.0)
MCHC: 33.3 g/dL (ref 30.0–36.0)
MCV: 85.1 fL (ref 78.0–100.0)
Platelets: 275 10*3/uL (ref 150–400)
RBC: 4.09 MIL/uL — ABNORMAL LOW (ref 4.22–5.81)
RDW: 16.6 % — AB (ref 11.5–15.5)
WBC: 11.2 10*3/uL — AB (ref 4.0–10.5)

## 2015-09-02 LAB — BASIC METABOLIC PANEL
ANION GAP: 13 (ref 5–15)
BUN: 31 mg/dL — AB (ref 6–20)
CHLORIDE: 89 mmol/L — AB (ref 101–111)
CO2: 27 mmol/L (ref 22–32)
Calcium: 9.4 mg/dL (ref 8.9–10.3)
Creatinine, Ser: 2.28 mg/dL — ABNORMAL HIGH (ref 0.61–1.24)
GFR calc Af Amer: 33 mL/min — ABNORMAL LOW (ref >60)
GFR, EST NON AFRICAN AMERICAN: 28 mL/min — AB (ref >60)
Glucose, Bld: 109 mg/dL — ABNORMAL HIGH (ref 65–99)
POTASSIUM: 3.3 mmol/L — AB (ref 3.5–5.1)
SODIUM: 129 mmol/L — AB (ref 135–145)

## 2015-09-02 LAB — HEPARIN LEVEL (UNFRACTIONATED)
HEPARIN UNFRACTIONATED: 0.23 [IU]/mL — AB (ref 0.30–0.70)
Heparin Unfractionated: 0.21 IU/mL — ABNORMAL LOW (ref 0.30–0.70)
Heparin Unfractionated: 0.3 IU/mL (ref 0.30–0.70)

## 2015-09-02 LAB — MAGNESIUM: MAGNESIUM: 1.9 mg/dL (ref 1.7–2.4)

## 2015-09-02 LAB — RENAL FUNCTION PANEL
ALBUMIN: 3 g/dL — AB (ref 3.5–5.0)
Anion gap: 12 (ref 5–15)
BUN: 33 mg/dL — AB (ref 6–20)
CO2: 28 mmol/L (ref 22–32)
CREATININE: 2.19 mg/dL — AB (ref 0.61–1.24)
Calcium: 9.5 mg/dL (ref 8.9–10.3)
Chloride: 92 mmol/L — ABNORMAL LOW (ref 101–111)
GFR calc Af Amer: 35 mL/min — ABNORMAL LOW (ref >60)
GFR, EST NON AFRICAN AMERICAN: 30 mL/min — AB (ref >60)
GLUCOSE: 123 mg/dL — AB (ref 65–99)
PHOSPHORUS: 3.9 mg/dL (ref 2.5–4.6)
Potassium: 3.4 mmol/L — ABNORMAL LOW (ref 3.5–5.1)
Sodium: 132 mmol/L — ABNORMAL LOW (ref 135–145)

## 2015-09-02 MED ORDER — POTASSIUM CHLORIDE CRYS ER 20 MEQ PO TBCR
30.0000 meq | EXTENDED_RELEASE_TABLET | Freq: Two times a day (BID) | ORAL | Status: AC
  Administered 2015-09-02 (×2): 30 meq via ORAL
  Filled 2015-09-02 (×4): qty 1

## 2015-09-02 MED ORDER — HEPARIN BOLUS VIA INFUSION
2000.0000 [IU] | Freq: Once | INTRAVENOUS | Status: AC
  Administered 2015-09-02: 2000 [IU] via INTRAVENOUS
  Filled 2015-09-02: qty 2000

## 2015-09-02 MED ORDER — TORSEMIDE 20 MG PO TABS
40.0000 mg | ORAL_TABLET | Freq: Two times a day (BID) | ORAL | Status: DC
  Administered 2015-09-02 – 2015-09-03 (×3): 40 mg via ORAL
  Filled 2015-09-02 (×3): qty 2

## 2015-09-02 MED ORDER — CARVEDILOL 3.125 MG PO TABS: 3.1250 mg | ORAL_TABLET | Freq: Two times a day (BID) | ORAL | Status: DC

## 2015-09-02 MED ORDER — CARVEDILOL 12.5 MG PO TABS: 12.5000 mg | ORAL_TABLET | Freq: Two times a day (BID) | ORAL | Status: DC

## 2015-09-02 MED ORDER — HYDRALAZINE HCL 50 MG PO TABS: 50.0000 mg | ORAL_TABLET | Freq: Three times a day (TID) | ORAL | Status: DC

## 2015-09-02 MED ORDER — CARVEDILOL 3.125 MG PO TABS
3.1250 mg | ORAL_TABLET | Freq: Two times a day (BID) | ORAL | Status: DC
  Administered 2015-09-02 – 2015-09-03 (×2): 3.125 mg via ORAL
  Filled 2015-09-02 (×2): qty 1

## 2015-09-02 MED ORDER — HEPARIN (PORCINE) IN NACL 100-0.45 UNIT/ML-% IJ SOLN
1750.0000 [IU]/h | INTRAMUSCULAR | Status: DC
  Administered 2015-09-02 (×2): 1600 [IU]/h via INTRAVENOUS
  Filled 2015-09-02: qty 250

## 2015-09-02 MED ORDER — HYDRALAZINE HCL 25 MG PO TABS
12.5000 mg | ORAL_TABLET | Freq: Three times a day (TID) | ORAL | Status: DC
  Administered 2015-09-02 – 2015-09-03 (×4): 12.5 mg via ORAL
  Filled 2015-09-02 (×4): qty 1

## 2015-09-02 NOTE — Progress Notes (Signed)
ANTICOAGULATION CONSULT NOTE - Follow Up Consult  Pharmacy Consult for heparin Indication: atrial fibrillation   Labs:  Recent Labs  08/30/15 0232 08/30/15 0915  08/30/15 2100 08/31/15 0718 08/31/15 1630 09/01/15 0314 09/01/15 1425 09/01/15 1600 09/02/15 0005  HGB 11.4*  --   --   --  10.8*  --  10.7*  --   --   --   HCT 34.6*  --   --   --  33.9*  --  32.6*  --   --   --   PLT 235  --   --   --  237  --  233  --   --   --   APTT  --  >200*  --  140* 115*  --   --   --   --   --   HEPARINUNFRC 0.93*  --   --  0.69 0.56 <0.10* <0.10* 0.15*  --  0.30  CREATININE 1.42*  --   < >  --  1.37* 1.80* 1.90*  --  2.11*  --   < > = values in this interval not displayed.   Assessment: 64yo male is now therapeutic on heparin after rate increase to 1400 units/hr but level is at the very lower end of goal range; pt had been getting heparin w/ CRRT, now off. No bleeding noted per nursing.  Goal of Therapy:  Heparin level 0.3-0.7 units/ml   Plan:  - Increase heparin gtt to 1500 units/hr since level is at the lower edge of goal range - Check an 8 hour heparin level  Lysle Pearl, PharmD, BCPS Pager # [phone] 09/02/2015 1:21 AM

## 2015-09-02 NOTE — Care Management Note (Signed)
Case Management Note  Patient Details  Name: Frank Davidson MRN: [id number] Date of Birth: 03/05/1951  Subjective/Objective:          Admitted with AKI, CHF          Action/Plan: Lives at home with Niece, has a walker and a cane at home. Private insurance with [facility] Medicare with prescription drug coverage - no problem getting his medication; Pharmacy of choice is [facility]. Patient could benefit from a Disease Management program for CHF. HHC choice offered, [facility]. Mary with [facility] called for arrangements. Attending MD please enter the face to face in Epic for [facility] services.  Expected Discharge Date:     Possibly 09/04/2015             Expected Discharge Plan:  Home w Home Health Services    Discharge planning Services  CM Consult  Post Acute Care Choice:    Choice offered to:  Patient  HH Arranged: RN,PT Disease Management Program for CHF [facility] Agency:  [facility]  Status of Service:  In process, will continue to follow  Medicare Important Message Given:  [facility] notification given  Reola Mosher [phone] 09/02/2015, 10:31 AM

## 2015-09-02 NOTE — Progress Notes (Signed)
ANTICOAGULATION CONSULT NOTE  Pharmacy Consult for heparin Indication: atrial fibrillation   Labs:  Recent Labs  08/30/15 2100  08/31/15 0718  09/01/15 0314  09/01/15 1600 09/02/15 0005 09/02/15 0804 09/02/15 1155 09/02/15 1529 09/02/15 1834  HGB  --   < > 10.8*  --  10.7*  --   --   --   --  11.6*  --   --   HCT  --   --  33.9*  --  32.6*  --   --   --   --  34.8*  --   --   PLT  --   --  237  --  233  --   --   --   --  275  --   --   APTT 140*  --  115*  --   --   --   --   --   --   --   --   --   HEPARINUNFRC 0.69  --  0.56  < > <0.10*  < >  --  0.30 0.23*  --   --  0.21*  CREATININE  --   --  1.37*  < > 1.90*  --  2.11*  --   --  2.28* 2.19*  --   < > = values in this interval not displayed.   Assessment: 64 yo man admitted 08/27/2015 for increased dyspnea and vol overload on apixaban PTA for h/o PAF. Creatinine found to be elevated to 7.1 and therefore switched to heparin. Was on CRRT with with therapeutic HL, however level has been dropping since stopped.   HL remains subtherapeutic at 0.21 on heparin 1600 units/hr. Nurse reports no issue with infusion or bleeding.  Goal of Therapy:  Heparin level 0.3-0.7 units/ml   Plan:  Bolus heparin 2000 units and increase heparin to 1750 units/h  6h HL Daily CBC/HL Monitor s/sx bleeding F/u transition to PO anticoagulation  Arlean Hopping. Newman Pies, PharmD Clinical Pharmacist Pager [phone] 09/02/2015 7:45 PM

## 2015-09-02 NOTE — Patient Outreach (Signed)
[facility]) Care Management  09/02/2015  Frank Davidson 10/11/1951 [id number]   Referral from Charlesetta Shanks, RN to assign Community RN, assigned [name], RN.  Thanks, [name]. [name] [facility] Care Management [facility] CM Assistant Phone: [phone] Fax: [phone]

## 2015-09-02 NOTE — Progress Notes (Signed)
Patient ID: Frank Davidson, male   DOB: [birth year], 64 y.o.   MRN: [id number] S:feels much better O:BP 142/90 mmHg  Pulse 112  Temp(Src) 98 F (36.7 C) (Oral)  Resp 18  Ht  (1.778 m)  Wt 95.301 kg (210 lb 1.6 oz)  BMI 30.15 kg/m2  SpO2 99%  Intake/Output Summary (Last 24 hours) at 09/02/15 0850 Last data filed at 09/02/15 0255  Gross per 24 hour  Intake 1012.74 ml  Output   1420 ml  Net -407.26 ml   Intake/Output: I/O last 3 completed shifts: In: 2100 [P.O.:1680; I.V.:370; IV Piggyback:50] Out: 3821 [Urine:3820; Stool:1]  Intake/Output this shift:    Weight change: 1.2 kg (2 lb 10.3 oz) Gen:WD WN AAM in NAD CVS:no rub Resp:cta Abd:+BS, soft, NT/ND Ext:no edema   Recent Labs Lab 08/28/15 0400  08/29/15 0420 08/29/15 1600 08/30/15 0232 08/30/15 1600 08/31/15 0718 08/31/15 1630 09/01/15 0314 09/01/15 1600  NA 126*  < > 136 136 139 133* 134* 130* 133* 129*  K 3.9  < > 4.5 4.6 4.8 4.1 4.1 3.6 3.4* 3.5  CL 91*  < > 98* 100* 102 98* 97* 93* 93* 90*  CO2 25  < > GLUCOSE 141*  < > 150* 148* 129* 140* 119* 118* 117* 116*  BUN 49*  < > 23*  CREATININE 3.74*  < > 1.55* 1.29* 1.42* 1.34* 1.37* 1.80* 1.90* 2.11*  ALBUMIN 3.2*  < > 3.1* 3.2* 3.3* 3.2*  --  3.1* 3.1* 3.3*  CALCIUM 8.1*  < > 8.7* 8.8* 8.8* 8.8* 8.9 8.8* 9.5 9.2  PHOS 4.2  < > 2.0* 2.3* 2.0* 1.9* 2.1* 4.2 3.5 3.6  AST 28  --   --   --   --   --   --   --  27  --   ALT 18  --   --   --   --   --   --   --  16*  --   < > = values in this interval not displayed. Liver Function Tests:  Recent Labs Lab 08/28/15 0400  08/31/15 1630 09/01/15 0314 09/01/15 1600  AST 28  --   --  27  --   ALT 18  --   --  16*  --   ALKPHOS 74  --   --  63  --   BILITOT 0.5  --   --  0.7  --   PROT 6.8  --   --  6.9  --   ALBUMIN 3.2*  < > 3.1* 3.1* 3.3*  < > = values in this interval not displayed. No results for input(s): LIPASE, AMYLASE in the last 168 hours. No results  for input(s): AMMONIA in the last 168 hours. CBC:  Recent Labs Lab 08/28/15 0400 08/29/15 0400 08/30/15 0232 08/31/15 0718 09/01/15 0314  WBC 7.1 7.9 10.7* 12.5* 11.9*  HGB 11.4* 11.2* 11.4* 10.8* 10.7*  HCT 34.6* 34.2* 34.6* 33.9* 32.6*  MCV 85.6 85.9 86.7 87.4 86.7  PLT 290 260 235 237 233   Cardiac Enzymes: No results for input(s): CKTOTAL, CKMB, CKMBINDEX, TROPONINI in the last 168 hours. CBG:  Recent Labs Lab 09/01/15 0839 09/01/15 1122 09/01/15 1720 09/01/15 2123 09/02/15 0719  GLUCAP 106* 132* 123* 124* 105*    Iron Studies: No results for input(s): IRON, TIBC, TRANSFERRIN, FERRITIN in the last 72 hours. Studies/Results: No results  found. . antiseptic oral rinse  7 mL Mouth Rinse BID  . docusate sodium  100 mg Oral BID  . famotidine  20 mg Oral QHS  . insulin aspart  0-9 Units Subcutaneous TID WC  . sodium chloride  3 mL Intravenous Q12H    BMET    Component Value Date/Time   NA 129* 09/01/2015 1600   K 3.5 09/01/2015 1600   CL 90* 09/01/2015 1600   CO2 27 09/01/2015 1600   GLUCOSE 116* 09/01/2015 1600   BUN 23* 09/01/2015 1600   CREATININE 2.11* 09/01/2015 1600   CREATININE 4.61* 06/30/2015 1618   CALCIUM 9.2 09/01/2015 1600   GFRNONAA 31* 09/01/2015 1600   GFRNONAA 20* 06/14/2015 1405   GFRAA 36* 09/01/2015 1600   GFRAA 24* 06/14/2015 1405   CBC    Component Value Date/Time   WBC 11.9* 09/01/2015 0314   WBC 7.1 06/14/2015 1427   RBC 3.76* 09/01/2015 0314   RBC 4.73 06/14/2015 1427   HGB 10.7* 09/01/2015 0314   HGB 12.8* 06/14/2015 1427   HCT 32.6* 09/01/2015 0314   HCT 41.0* 06/14/2015 1427   PLT 233 09/01/2015 0314   MCV 86.7 09/01/2015 0314   MCV 86.7 06/14/2015 1427   MCH 28.5 09/01/2015 0314   MCH 27.1 06/14/2015 1427   MCHC 32.8 09/01/2015 0314   MCHC 31.3* 06/14/2015 1427   RDW 16.9* 09/01/2015 0314   LYMPHSABS 1.8 07/18/2015 1156   MONOABS 0.8 07/18/2015 1156   EOSABS 0.2 07/18/2015 1156   BASOSABS 0.1 07/18/2015 1156      Assessment/Plan:  1. AKI/CKD stage 3-4- possibly toxin related but most likely due to cardiorenal syndrome. Off of CVVHD and is non-oliguric. No indication to resume HD for now. Will continue to follow UOP and electrolytes. D/c'd foley catheter and has good urine output with only small increase in Scr.  He is actually below his baseline Scr of 2-3.  Will need to be more aggressive with management of his CHF to prevent recurrent cardiorenal syndrome. 1. No indication for dialysis at this time.  Continue to follow UOP and daily Scr. 2. Acute on chronic systolic heart failure (NICMP EF 25%). Resume diuretics in am per cardiology.  Good diuresis. 3. Hyponatremia- related to CHF.  Continue to follow with diuresis. 4. PAF- on eliquis 5. Obesity 6. Diabetes mellitus 7. AMS- likely metabolic encephalopathy. Improved after CVVHD  Randal Yepiz A

## 2015-09-02 NOTE — Progress Notes (Addendum)
ANTICOAGULATION CONSULT NOTE - Follow Up Consult  Pharmacy Consult for heparin Indication: atrial fibrillation   Labs:  Recent Labs  08/30/15 2100 08/31/15 0718 08/31/15 1630 09/01/15 0314 09/01/15 1425 09/01/15 1600 09/02/15 0005 09/02/15 0804  HGB  --  10.8*  --  10.7*  --   --   --   --   HCT  --  33.9*  --  32.6*  --   --   --   --   PLT  --  237  --  233  --   --   --   --   APTT 140* 115*  --   --   --   --   --   --   HEPARINUNFRC 0.69 0.56 <0.10* <0.10* 0.15*  --  0.30 0.23*  CREATININE  --  1.37* 1.80* 1.90*  --  2.11*  --   --      Assessment: 64 yo man admitted 08/27/2015 for increased dyspnea and vol overload on apixaban PTA for h/o PAF. Creatinine found to be elevated to 7.1 and therefore switched to heparin. Was on CRRT with with therapeutic HL, however level has been dropping since stopped.   HL 0.23, subtherapeutic on heparin 1500 units/h. No bleeding noted per RN. Cr 2.11.  Goal of Therapy:  Heparin level 0.3-0.7 units/ml   Plan:  Increase heparin to 1600 units/h  CBC this AM 1900 HL Daily CBC/HL Monitor s/sx bleeding F/u transition to PO anticoagulation    Hillery Aldo, [address].D. PGY2 Cardiology Pharmacy Resident Pager: [phone]  09/02/2015 9:55 AM   Addendum: CBC stable. F/u HL and CBC tomorrow AM. Once Cr stable, transition to PO anticoagulant.   Hillery Aldo, [place].D. PGY2 Cardiology Pharmacy Resident Pager: [phone] 09/02/2015, 2:30 PM

## 2015-09-02 NOTE — Progress Notes (Signed)
Family Medicine Teaching Service Daily Progress Note Intern Pager: [phone]  Patient name: Frank Davidson Medical record number: [id number] Date of birth: 04-14-1951 Age: 64 y.o. Gender: male  Primary Care Provider: Abbe Amsterdam, MD Consultants: cardiology, nephrology, CCM Code Status: full  Pt Overview and Major Events to Date:  10/05-ICU with fluid overload, hyperkalemia, uremia. Started on CVVHD 10/09-off CVVHD 10/11-transferred to floor. Assessment and Plan: Frank Davidson is 64yo male with hx COPD, HTN, DM, AFib, CKD III, OSA on CPAP and CHF. Presented 10/5 with SOB and weight gain over two weeks that has acutely gotten worse over two days. Found to be hypotensive. Weight up from 222 lbs to 242 lbs, K 6.2, Cr 7.1 (baseline 2.4-2.8). BUN 86. He was briefly admitted to FPTS and transferred to ICU for CVVHD. Patient was on herbal diuretics "kidney cleanse" for two weeks before presentation.  In ICU patient had Central Venous Trialysis Catheter placed on 08/27/2015 and recieved CVVHD (10/5-10/9). Over the course, weight down 27 lbs, creatinine down to 1.8, BUN down to 13, K down to 3.6. His breathing and urine output improved significantly. He was observed off CVVHD on 10/10 and transferred to floor on 10/11.  Dyspnea from volume overload- resolved. Mild orthopnea tonight. Looks euvolimic. No crackles on lung exam. Cardiorenal syndrome (AoCKD + acute on chronic systolic CHF) vs. COPD. Has hx of OSA as well. UOP 1.8L. Net -0.5L. -started home torsemide 40 mg bid today. -BMP in the morning -Supplemental O2 as necessary.  AoCKD - Baseline creatinine 2.4-2.8. Admit creatinine 7.1 s/p CVVHD in ICU. Cr 1.9. UOP 1.8L. Net -0.5L. - trend BMP  Acute on Chronic Systolic CHF r/t NICM. Echo with EF 25-30%, diffuse hypokinesis, mild to mod dilatation of all chambers. Valves normal. On coreg 12.5 mg bid, hydralazine 50 mg tid, torsemide 40 mg bid, spiro 12.5 dialy, metolazone 5 mg prn at home.  All held while in ICU. -resumed coreg 3.125 mg bid -Hydralazine 12.5 mg tid -torsemide 40 mg bid -strict I&O's  PAF: on Apixaban 5 mg at home. On heparin drip -will restart home Apixiaban if renal function okay.  HTN: Holding his home meds -resume home meds as above -May titrate as needed  COPD: stable. On albuterol  Gout: on allopurinol 100 mg daily at home -will resume on discharge  Osteoarthritis: on flector patch, baclofen and percocet at home. -continue home meds  DM-2: A1c 5.6 in 12/2014. On glipizide 5 mg bid at home. May not need if A1c within normal range -SSI  Anemia of chronic disease: Hgb 10.7. Baseline 11-12. Appears stable.  Electrolytes imbalance (K, Mg, P): repleted and resolved.  FEN/GI: -heart health diet -Docusate 100 mg bid -Famotidine 20 mg bid -Zofran prn  PPX: heparin per pharmacy.  -Will transition him to home Apixiaban today  Access: D/c central line. D/c foley.  Disposition: floor today. Possible discharge home tomorrow with home-aid nurse and PT  Subjective:  Transferred to floor yesterday. Had a good night. Reports mild shortness of breath when he lied flat. Denies chest pain.   Objective: Temp:  [98 F (36.7 C)-98.3 F (36.8 C)] 98 F (36.7 C) (10/11 0434) Pulse Rate:  [92-112] 112 (10/11 0434) Resp:  [18] 18 (10/11 0434) BP: (113-142)/(80-90) 117/80 mmHg (10/11 1036) SpO2:  [99 %-100 %] 99 % (10/11 0434) Weight:  [210 lb 1.6 oz (95.301 kg)] 210 lb 1.6 oz (95.301 kg) (10/11 0434) Physical Exam: Gen: sitting in chair, NAD, well-appearing  Oropharynx: clear, moist Neck: no JVD CV: RRR.  S1 & S2, 2/6 SEM over RUSB Resp: no apparent WOB, CTAB. Abd: +BS. Soft, NDNT, no rebound or guarding.  Ext: No edema or gross deformities. Neuro: Alert and oriented, No gross focal deficits  Laboratory:  Recent Labs Lab 08/30/15 0232 08/31/15 0718 09/01/15 0314  WBC 10.7* 12.5* 11.9*  HGB 11.4* 10.8* 10.7*  HCT 34.6* 33.9* 32.6*  PLT  235 237 233    Recent Labs Lab 08/28/15 0400  08/31/15 1630 09/01/15 0314 09/01/15 1600  NA 126*  < > 130* 133* 129*  K 3.9  < > 3.6 3.4* 3.5  CL 91*  < > 93* 93* 90*  CO2 25  < > 28 28 27   BUN 49*  < > 13 18 23*  CREATININE 3.74*  < > 1.80* 1.90* 2.11*  CALCIUM 8.1*  < > 8.8* 9.5 9.2  PROT 6.8  --   --  6.9  --   BILITOT 0.5  --   --  0.7  --   ALKPHOS 74  --   --  63  --   ALT 18  --   --  16*  --   AST 28  --   --  27  --   GLUCOSE 141*  < > 118* 117* 116*  < > = values in this interval not displayed.   Imaging/Diagnostic Tests: No results found.  Almon Hercules, MD 09/02/2015, 11:28 AM PGY-1, [place] [facility] FPTS Intern pager: [phone], text pages welcome

## 2015-09-02 NOTE — Consult Note (Signed)
   [facility] Inpatient Consult   09/02/2015  Frank Davidson June 17, 1951 [id number] Patient is currently active with [facility] for chronic disease management services.  Patient has been engaged by a Research scientist (medical) and [place].   Our community based plan of care has focused on disease management and community resource support.Met with patient at bedside and he endorses to continue with community follow up with [place] [facility] services.   Patient will receive a post discharge transition of care call and will be evaluated for monthly home visits for assessments and disease process education.  Will make Inpatient Case Manager aware that [facility] following. Of note, [facility] Care Management services does not replace or interfere with any services that are arranged by inpatient case management or social work.  For additional questions or referrals please contact: Natividad Brood, RN BSN [facility] Liaison  [phone] business mobile phone

## 2015-09-02 NOTE — Progress Notes (Addendum)
Advanced Heart Failure Rounding Note   Subjective:    Now off CVVHD. Weight stable. BMET pending. Feels good.    ECHO 10/6 EF 25-30% RV ok    Objective:   Weight Range:  Vital Signs:   Temp:  [97.9 F (36.6 C)-98.3 F (36.8 C)] 98 F (36.7 C) (10/11 0434) Pulse Rate:  [80-112] 112 (10/11 0434) Resp:  [16-18] 18 (10/11 0434) BP: (111-142)/(71-90) 142/90 mmHg (10/11 0434) SpO2:  [97 %-100 %] 99 % (10/11 0434) Weight:  [95.301 kg (210 lb 1.6 oz)-98.5 kg (217 lb 2.5 oz)] 95.301 kg (210 lb 1.6 oz) (10/11 0434) Last BM Date: 09/01/15  Weight change: Filed Weights   09/01/15 0500 09/01/15 1109 09/02/15 0434  Weight: 97.3 kg (214 lb 8.1 oz) 98.5 kg (217 lb 2.5 oz) 95.301 kg (210 lb 1.6 oz)    Intake/Output:   Intake/Output Summary (Last 24 hours) at 09/02/15 0910 Last data filed at 09/02/15 0255  Gross per 24 hour  Intake 881.74 ml  Output   1395 ml  Net -513.26 ml     Physical Exam: General: Sitting in chair. NAD.  HEENT: normal Neck: supple. RIJ trialysis cath   Carotids 2+ bilat; no bruits. No lymphadenopathy or thryomegaly appreciated. Cor: PMI nonpalpable. Regular rate & rhythm. No rubs, gallops or murmurs. Lungs: clear Abdomen: obese, soft, nontender, mildly distended. No hepatosplenomegaly. No bruits or masses. Good bowel sounds. Extremities: no cyanosis, clubbing, rash, trase edema Neuro: alert & orientedx3, cranial nerves grossly intact. moves all 4 extremities w/o difficulty. Affect pleasant  Telemetry: Sinus tach 100-110  Labs: Basic Metabolic Panel:  Recent Labs Lab 08/29/15 0400  08/30/15 0232 08/30/15 1600 08/31/15 0718 08/31/15 1630 09/01/15 0314 09/01/15 1600 09/02/15 0005  NA 136  < > 139 133* 134* 130* 133* 129*  --   K 4.5  < > 4.8 4.1 4.1 3.6 3.4* 3.5  --   CL 98*  < > 102 98* 97* 93* 93* 90*  --   CO2 27  < > --   GLUCOSE 148*  < > 129* 140* 119* 118* 117* 116*  --   BUN 15  < > 23*  --     CREATININE 1.45*  < > 1.42* 1.34* 1.37* 1.80* 1.90* 2.11*  --   CALCIUM 8.7*  < > 8.8* 8.8* 8.9 8.8* 9.5 9.2  --   MG 2.4  --  2.3  --  2.3  --  1.8  --  1.9  PHOS 2.0*  < > 2.0* 1.9* 2.1* 4.2 3.5 3.6  --   < > = values in this interval not displayed.  Liver Function Tests:  Recent Labs Lab 08/28/15 0400  08/30/15 0232 08/30/15 1600 08/31/15 1630 09/01/15 0314 09/01/15 1600  AST 28  --   --   --   --  27  --   ALT 18  --   --   --   --  16*  --   ALKPHOS 74  --   --   --   --  63  --   BILITOT 0.5  --   --   --   --  0.7  --   PROT 6.8  --   --   --   --  6.9  --   ALBUMIN 3.2*  < > 3.3* 3.2* 3.1* 3.1* 3.3*  < > = values in this interval not  displayed. No results for input(s): LIPASE, AMYLASE in the last 168 hours. No results for input(s): AMMONIA in the last 168 hours.  CBC:  Recent Labs Lab 08/28/15 0400 08/29/15 0400 08/30/15 0232 08/31/15 0718 09/01/15 0314  WBC 7.1 7.9 10.7* 12.5* 11.9*  HGB 11.4* 11.2* 11.4* 10.8* 10.7*  HCT 34.6* 34.2* 34.6* 33.9* 32.6*  MCV 85.6 85.9 86.7 87.4 86.7  PLT 290 260 235 237 233    Cardiac Enzymes: No results for input(s): CKTOTAL, CKMB, CKMBINDEX, TROPONINI in the last 168 hours.  BNP: BNP (last 3 results)  Recent Labs  07/18/15 1156 08/08/15 1115 08/27/15 1302  BNP 523.3* 805.4* 328.8*    ProBNP (last 3 results)  Recent Labs  10/19/14 2158 10/25/14 1048  PROBNP 1637.0* 3555.0*      Other results:  Imaging: No results found.   Medications:     Scheduled Medications: . antiseptic oral rinse  7 mL Mouth Rinse BID  . docusate sodium  100 mg Oral BID  . famotidine  20 mg Oral QHS  . insulin aspart  0-9 Units Subcutaneous TID WC  . sodium chloride  3 mL Intravenous Q12H    Infusions: . heparin 1,500 Units/hr (09/02/15 0110)    PRN Medications: sodium chloride, albuterol, HYDROmorphone (DILAUDID) injection, Influenza vac split quadrivalent PF, ondansetron (ZOFRAN) IV, oxyCODONE-acetaminophen,  sodium chloride   Assessment:   1. A/C Systolic Heart Failure due to NICM EF 25% 2. Cardiorenal syndrome 3. Hyperkalemia- K on admit 6.2  4. AKI/CKD stage IV-V - Baseline creatinine 2.4-2.8 -- Admit creatinine 7.1 5. PAF - on Eliquis at home 6. Obesity  7. DM  8. Ileus 9. Acute delirium likely due to uremia - improved  Plan/Discussion:    Volume status and electrolytes improved  Given low EF, suspicion high for cardiorenal syndrome but initial co-ox suggests normal cardiac output. May have had transient hypotension leading to ATN or toxin related (was taking herbal supplement)  Now off CVVHD. Urine output much improved now and weight stable. Will check BMET this am. Resume home dose torsemide. If renal function remains stable home tomorrow if OK with Renal   If renal function remains stable can switch back to Eliquis  today  Sinus tach remains a concern. Begin to resume home hydralazine and carvedilol.     Length of Stay: 6   Bensimhon, Daniel MD 09/02/2015, 9:10 AM  Advanced Heart Failure Team Pager [phone] (M-F; 7a - 4p)  Please contact [facility] for night-coverage after hours (4p -7a ) and weekends on [email or web address]

## 2015-09-03 ENCOUNTER — Telehealth: Payer: Self-pay

## 2015-09-03 DIAGNOSIS — N183 Chronic kidney disease, stage 3 (moderate): Secondary | ICD-10-CM

## 2015-09-03 DIAGNOSIS — N170 Acute kidney failure with tubular necrosis: Secondary | ICD-10-CM | POA: Diagnosis not present

## 2015-09-03 LAB — HEMOGLOBIN A1C
Hgb A1c MFr Bld: 5.9 % — ABNORMAL HIGH (ref 4.8–5.6)
Mean Plasma Glucose: 123 mg/dL

## 2015-09-03 LAB — HEPARIN LEVEL (UNFRACTIONATED): HEPARIN UNFRACTIONATED: 0.54 [IU]/mL (ref 0.30–0.70)

## 2015-09-03 LAB — GLUCOSE, CAPILLARY
GLUCOSE-CAPILLARY: 162 mg/dL — AB (ref 65–99)
GLUCOSE-CAPILLARY: 93 mg/dL (ref 65–99)
GLUCOSE-CAPILLARY: 143 mg/dL — AB (ref 65–99)

## 2015-09-03 LAB — CBC
HCT: 35.5 % — ABNORMAL LOW (ref 39.0–52.0)
Hemoglobin: 11.9 g/dL — ABNORMAL LOW (ref 13.0–17.0)
MCH: 28.3 pg (ref 26.0–34.0)
MCHC: 33.5 g/dL (ref 30.0–36.0)
MCV: 84.5 fL (ref 78.0–100.0)
PLATELETS: 240 10*3/uL (ref 150–400)
RBC: 4.2 MIL/uL — AB (ref 4.22–5.81)
RDW: 16.7 % — ABNORMAL HIGH (ref 11.5–15.5)
WBC: 9.8 10*3/uL (ref 4.0–10.5)

## 2015-09-03 LAB — BASIC METABOLIC PANEL
ANION GAP: 12 (ref 5–15)
BUN: 36 mg/dL — ABNORMAL HIGH (ref 6–20)
CO2: 26 mmol/L (ref 22–32)
Calcium: 9.7 mg/dL (ref 8.9–10.3)
Chloride: 91 mmol/L — ABNORMAL LOW (ref 101–111)
Creatinine, Ser: 2.29 mg/dL — ABNORMAL HIGH (ref 0.61–1.24)
GFR, EST AFRICAN AMERICAN: 33 mL/min — AB (ref >60)
GFR, EST NON AFRICAN AMERICAN: 28 mL/min — AB (ref >60)
Glucose, Bld: 112 mg/dL — ABNORMAL HIGH (ref 65–99)
POTASSIUM: 3.9 mmol/L (ref 3.5–5.1)
SODIUM: 129 mmol/L — AB (ref 135–145)

## 2015-09-03 MED ORDER — CARVEDILOL 12.5 MG PO TABS: 12.5000 mg | ORAL_TABLET | Freq: Two times a day (BID) | ORAL | Status: DC

## 2015-09-03 MED ORDER — CARVEDILOL 12.5 MG PO TABS
12.5000 mg | ORAL_TABLET | Freq: Two times a day (BID) | ORAL | Status: DC
  Administered 2015-09-03: 12.5 mg via ORAL
  Filled 2015-09-03: qty 1

## 2015-09-03 MED ORDER — CARVEDILOL 6.25 MG PO TABS: 6.2500 mg | ORAL_TABLET | Freq: Two times a day (BID) | ORAL | Status: DC

## 2015-09-03 MED ORDER — APIXABAN 5 MG PO TABS
5.0000 mg | ORAL_TABLET | Freq: Two times a day (BID) | ORAL | Status: DC
  Administered 2015-09-03: 5 mg via ORAL
  Filled 2015-09-03: qty 1

## 2015-09-03 NOTE — Progress Notes (Signed)
Patient ID: Adain X Veronica, male   DOB: [birth year], 64 y.o.   MRN: [id number] S:feels well, no complaints O:BP 120/62 mmHg  Pulse 107  Temp(Src) 97.9 F (36.6 C) (Oral)  Resp 20  Ht  (1.778 m)  Wt 94.575 kg (208 lb 8 oz)  BMI 29.92 kg/m2  SpO2 100%  Intake/Output Summary (Last 24 hours) at 09/03/15 0836 Last data filed at 09/02/15 1800  Gross per 24 hour  Intake 701.74 ml  Output   1101 ml  Net -399.26 ml   Intake/Output: I/O last 3 completed shifts: In: 1068.3 [P.O.:940; I.V.:128.3] Out: 1701 [Urine:1700; Stool:1]  Intake/Output this shift:    Weight change: -3.925 kg (-8 lb 10.5 oz) Gen:WD WN AAM in NAD WUJ:WJXBJ, IRR IRR Resp:cta YNW:GNFAOZ HYQ:MVHQI edema   Recent Labs Lab 08/28/15 0400  08/29/15 1600 08/30/15 0232 08/30/15 1600 08/31/15 0718 08/31/15 1630 09/01/15 0314 09/01/15 1600 09/02/15 1155 09/02/15 1529 09/03/15 0134  NA 126*  < > 136 139 133* 134* 130* 133* 129* 129* 132* 129*  K 3.9  < > 4.6 4.8 4.1 4.1 3.6 3.4* 3.5 3.3* 3.4* 3.9  CL 91*  < > 100* 102 98* 97* 93* 93* 90* 89* 92* 91*  CO2 25  < > GLUCOSE 141*  < > 148* 129* 140* 119* 118* 117* 116* 109* 123* 112*  BUN 49*  < > 23* 31* 33* 36*  CREATININE 3.74*  < > 1.29* 1.42* 1.34* 1.37* 1.80* 1.90* 2.11* 2.28* 2.19* 2.29*  ALBUMIN 3.2*  < > 3.2* 3.3* 3.2*  --  3.1* 3.1* 3.3*  --  3.0*  --   CALCIUM 8.1*  < > 8.8* 8.8* 8.8* 8.9 8.8* 9.5 9.2 9.4 9.5 9.7  PHOS 4.2  < > 2.3* 2.0* 1.9* 2.1* 4.2 3.5 3.6  --  3.9  --   AST 28  --   --   --   --   --   --  27  --   --   --   --   ALT 18  --   --   --   --   --   --  16*  --   --   --   --   < > = values in this interval not displayed. Liver Function Tests:  Recent Labs Lab 08/28/15 0400  09/01/15 0314 09/01/15 1600 09/02/15 1529  AST 28  --  27  --   --   ALT 18  --  16*  --   --   ALKPHOS 74  --  63  --   --   BILITOT 0.5  --  0.7  --   --   PROT 6.8  --  6.9  --   --   ALBUMIN 3.2*  <  > 3.1* 3.3* 3.0*  < > = values in this interval not displayed. No results for input(s): LIPASE, AMYLASE in the last 168 hours. No results for input(s): AMMONIA in the last 168 hours. CBC:  Recent Labs Lab 08/30/15 0232 08/31/15 0718 09/01/15 0314 09/02/15 1155 09/03/15 0134  WBC 10JOMARI BARTNIK* 11.2* 9.8  HGB 11.4* 10.8* 10.7* 11.6* 11.9*  HCT 34.6* 33.9* 32.6* 34.8* 35.5*  MCV 86.7 87.4 86.7 85.1 84.5  PLT 235 237 233 275 240   Cardiac Enzymes: No results for input(s): CKTOTAL, CKMB, CKMBINDEX, TROPONINI in the last 168 hours. CBG:  Recent Labs Lab 09/02/15 0719 09/02/15 1156 09/02/15 1640 09/02/15 2107 09/03/15 0612  GLUCAP 105* 106* 118* 115* 162*    Iron Studies: No results for input(s): IRON, TIBC, TRANSFERRIN, FERRITIN in the last 72 hours. Studies/Results: No results found. Marland Kitchen antiseptic oral rinse  7 mL Mouth Rinse BID  . apixaban  5 mg Oral BID  . carvedilol  3.125 mg Oral BID WC  . docusate sodium  100 mg Oral BID  . famotidine  20 mg Oral QHS  . hydrALAZINE  12.5 mg Oral 3 times per day  . insulin aspart  0-9 Units Subcutaneous TID WC  . sodium chloride  3 mL Intravenous Q12H  . torsemide  40 mg Oral BID    BMET    Component Value Date/Time   NA 129* 09/03/2015 0134   K 3.9 09/03/2015 0134   CL 91* 09/03/2015 0134   CO2 26 09/03/2015 0134   GLUCOSE 112* 09/03/2015 0134   BUN 36* 09/03/2015 0134   CREATININE 2.29* 09/03/2015 0134   CREATININE 4.61* 06/30/2015 1618   CALCIUM 9.7 09/03/2015 0134   GFRNONAA 28* 09/03/2015 0134   GFRNONAA 20* 06/14/2015 1405   GFRAA 33* 09/03/2015 0134   GFRAA 24* 06/14/2015 1405   CBC    Component Value Date/Time   WBC 9.8 09/03/2015 0134   WBC 7.1 06/14/2015 1427   RBC 4.20* 09/03/2015 0134   RBC 4.73 06/14/2015 1427   HGB 11.9* 09/03/2015 0134   HGB 12.8* 06/14/2015 1427   HCT 35.5* 09/03/2015 0134   HCT 41.0* 06/14/2015 1427   PLT 240 09/03/2015 0134   MCV 84.5 09/03/2015 0134   MCV 86.7  06/14/2015 1427   MCH 28.3 09/03/2015 0134   MCH 27.1 06/14/2015 1427   MCHC 33.5 09/03/2015 0134   MCHC 31.3* 06/14/2015 1427   RDW 16.7* 09/03/2015 0134   LYMPHSABS 1.8 07/18/2015 1156   MONOABS 0.8 07/18/2015 1156   EOSABS 0.2 07/18/2015 1156   BASOSABS 0.1 07/18/2015 1156     Assessment/Plan:  1. AKI/CKD stage 3-4- possibly toxin related but most likely due to cardiorenal syndrome. Off of CVVHD and is non-oliguric. No indication to resume HD for now. Will continue to follow UOP and electrolytes. D/c'd foley catheter and has good urine output with only small increase in Scr. He is back at his baseline Scr of 2-3. Will need to be more aggressive with management of his CHF to prevent recurrent cardiorenal syndrome. 1. No indication for dialysis at this time.  2. Continue to follow UOP and daily Scr. 3. Back at baseline and okay to follow up with Dr. Briant Cedar as an outpt (will be scheduled within the next 3 weeks). 2. Acute on chronic systolic heart failure (NICMP EF 25%). Resume diuretics per cardiology. Good diuresis. 3. Hyponatremia- related to CHF. Continue to follow with diuresis. 4. PAF- on eliquis 5. Obesity 6. Diabetes mellitus 7. AMS- likely metabolic encephalopathy.now resolved.  Hawa Henly A

## 2015-09-03 NOTE — Progress Notes (Signed)
Advanced Heart Failure Rounding Note   Subjective:    No complaints today. Went over eliquis with pharmacy including how to take and possible side effects. Hopes he can go home today.  Now off CVVHD. On Torsemide 40 mg BID. out 400 cc and Down 2 lbs. Cr only up slightly.   ECHO 10/6 EF 25-30% RV ok  Objective:   Weight Range:  Vital Signs:   Temp:  [97.4 F (36.3 C)-97.9 F (36.6 C)] 97.9 F (36.6 C) (10/12 0438) Pulse Rate:  [102-110] 107 (10/12 0438) Resp:  [20] 20 (10/12 0438) BP: (100-120)/(62-80) 120/62 mmHg (10/12 0438) SpO2:  [99 %-100 %] 100 % (10/12 0438) Weight:  [208 lb 8 oz (94.575 kg)] 208 lb 8 oz (94.575 kg) (10/12 0438) Last BM Date: 09/02/15  Weight change: Filed Weights   09/01/15 1109 09/02/15 0434 09/03/15 0438  Weight: 217 lb 2.5 oz (98.5 kg) 210 lb 1.6 oz (95.301 kg) 208 lb 8 oz (94.575 kg)    Intake/Output:   Intake/Output Summary (Last 24 hours) at 09/03/15 1033 Last data filed at 09/03/15 1029  Gross per 24 hour  Intake 941.74 ml  Output   1701 ml  Net -759.26 ml     Physical Exam: General:NAD, seated in chair. HEENT: normal Neck: supple. Bandage R neck, clean, dry and intact. Carotids 2+ bilat; no bruits. No lymphadenopathy or thyromegaly. Cor: PMI nonpalpable. Regular rate & rhythm. No rubs, gallops or murmurs appreciated Lungs: CTA Abdomen: obese, soft, NT, mildly distended. No HSM. No bruits or masses. +BS Extremities: no cyanosis, clubbing, rash, trace edema Neuro: alert & orientedx3, cranial nerves grossly intact. moves all 4 extremities w/o difficulty. Affect pleasant  Telemetry: Sinus tach 100-110  Labs: Basic Metabolic Panel:  Recent Labs Lab 08/29/15 0400  08/30/15 0232  08/31/15 0718 08/31/15 1630 09/01/15 0314 09/01/15 1600 09/02/15 0005 09/02/15 1155 09/02/15 1529 09/03/15 0134  NA 136  < > 139  < > 134* 130* 133* 129*  --  129* 132* 129*  K 4.5  < > 4.8  < > 4.1 3.6 3.4* 3.5  --  3.3* 3.4* 3.9  CL 98*   < > 102  < > 97* 93* 93* 90*  --  89* 92* 91*  CO2 27  < > 26  < > --  GLUCOSE 148*  < > 129*  < > 119* 118* 117* 116*  --  109* 123* 112*  BUN 15  < > 8  < > 23*  --  31* 33* 36*  CREATININE 1.45*  < > 1.42*  < > 1.37* 1.80* 1.90* 2.11*  --  2.28* 2.19* 2.29*  CALCIUM 8.7*  < > 8.8*  < > 8.9 8.8* 9.5 9.2  --  9.4 9.5 9.7  MG 2.4  --  2.3  --  2.3  --  1.8  --  1.9  --   --   --   PHOS 2.0*  < > 2.0*  < > 2.1* 4.2 3.5 3.6  --   --  3.9  --   < > = values in this interval not displayed.  Liver Function Tests:  Recent Labs Lab 08/28/15 0400  08/30/15 1600 08/31/15 1630 09/01/15 0314 09/01/15 1600 09/02/15 1529  AST 28  --   --   --  27  --   --   ALT 18  --   --   --  16*  --   --   ALKPHOS 74  --   --   --  63  --   --   BILITOT 0.5  --   --   --  0.7  --   --   PROT 6.8  --   --   --  6.9  --   --   ALBUMIN 3.2*  < > 3.2* 3.1* 3.1* 3.3* 3.0*  < > = values in this interval not displayed. No results for input(s): LIPASE, AMYLASE in the last 168 hours. No results for input(s): AMMONIA in the last 168 hours.  CBC:  Recent Labs Lab 08/30/15 0232 08/31/15 0718 09/01/15 0314 09/02/15 1155 09/03/15 0134  WBC 10.7* 12.5* 11.9* 11.2* 9.8  HGB 11.4* 10.8* 10.7* 11.6* 11.9*  HCT 34.6* 33.9* 32.6* 34.8* 35.5*  MCV 86.7 87.4 86.7 85.1 84.5  PLT 235 237 233 275 240    Cardiac Enzymes: No results for input(s): CKTOTAL, CKMB, CKMBINDEX, TROPONINI in the last 168 hours.  BNP: BNP (last 3 results)  Recent Labs  07/18/15 1156 08/08/15 1115 08/27/15 1302  BNP 523.3* 805.4* 328.8*    ProBNP (last 3 results)  Recent Labs  10/19/14 2158 10/25/14 1048  PROBNP 1637.0* 3555.0*      Other results:  Imaging: No results found.   Medications:     Scheduled Medications: . antiseptic oral rinse  7 mL Mouth Rinse BID  . apixaban  5 mg Oral BID  . carvedilol  3.125 mg Oral BID WC  . docusate sodium  100 mg Oral BID  . famotidine  20 mg  Oral QHS  . hydrALAZINE  12.5 mg Oral 3 times per day  . insulin aspart  0-9 Units Subcutaneous TID WC  . sodium chloride  3 mL Intravenous Q12H  . torsemide  40 mg Oral BID    Infusions:    PRN Medications: sodium chloride, albuterol, HYDROmorphone (DILAUDID) injection, Influenza vac split quadrivalent PF, ondansetron (ZOFRAN) IV, oxyCODONE-acetaminophen, sodium chloride   Assessment:   1. A/C Systolic Heart Failure due to NICM EF 25% 2. Cardiorenal syndrome 3. Hyperkalemia- K on admit 6.2  4. AKI/CKD stage IV-V - Baseline creatinine 2.4-2.8 -- Admit creatinine 7.1 5. PAF - on Eliquis at home 6. Obesity  7. DM  8. Ileus 9. Acute delirium likely due to uremia - improved  Plan/Discussion:    Volume status and electrolytes improved.  Given low EF, suspicion high for cardiorenal syndrome but initial co-ox suggests normal cardiac output. May have had transient hypotension leading to ATN or toxin related (was taking herbal supplement)  Now off CVVHD. Urine output much improved and weight stable. Renal function stable, now back on Eliquis. May be stable for home.   Started back on home hydralazine and carvedilol 09/02/15. Remains slightly tachy.  Increase coreg to 6.25 BID. (previous home dose 12.5 BID)   Length of Stay: 7   Graciella Freer PA-C 09/03/2015, 10:33 AM  Advanced Heart Failure Team Pager [phone] (M-F; 7a - 4p)  Please contact [facility] for night-coverage after hours (4p -7a ) and weekends on [email or web address]  Patient seen and examined with Otilio Saber, PA-C. We discussed all aspects of the encounter. I agree with the assessment and plan as stated above.   He is improved ready for d/c today with close outpatient f/u.   Would keep carvedilol at 6.25 bid for now. Increase hydralazine to 25 tid (was on 50 tid at home). Would hold off on  spiro for now. Resume previous torsemide dose. Ok to resume apixaban. Will fu/u in HF Clinic in 1-2 weeks.    Shawntina Diffee,MD 1:20 PM

## 2015-09-03 NOTE — Telephone Encounter (Signed)
Dr Alanda Slim called from [facility] stating he would be releasing patient today. He was requesting an appointment for patient for this Friday 09/05/15. Dr Alanda Slim stated patient could not wait till 09/10/15 because of the change he made to his medication. I informed him you were not here on Friday but would be at the walk in clinic tomorrow. Dr Alanda Slim wanted me to let you know he is going to advise patient to come then to see you. He is aware this would not be an appointment as it is at the walk in clinic. I have left the appointment in the system for 09/10/15 with you just in case patient does not come tomorrow.

## 2015-09-03 NOTE — Progress Notes (Signed)
Physical Therapy Treatment Patient Details Name: Frank Davidson MRN: [id number] DOB: 1951-09-02 Today's Date: 09/03/2015    History of Present Illness Adm 10/5 with acute on chronic CHF; cardiorenal syndrome; 10/5-10/9 on CVVH PMHx-DM, Afib, CHF, COPD, CVA with Lt sided weakness; arthritis.    PT Comments    Patient sitting EOB bed, agreeable to participate in PT today. MD briefly saw patient to listen to his lungs and consult him about PCP follow-up. Patient was able to ambulate and navigate stairs as described below. He is anxious to return home, agreeable with recommendations from PT for continued use of RW, HHPT and supervision to climb the stairs. 3/4 goals met and updated. Patient will benefit from continued PT while in the hospital to increase his ambulatory endurance and independence in the hope of returning him to independent walking with an AD.   Follow Up Recommendations  Home health PT;Supervision - Intermittent     Equipment Recommendations  Other (comment) (TBA - patient has RW and cane at home.)    Recommendations for Other Services       Precautions / Restrictions Precautions Precautions: Fall Restrictions Weight Bearing Restrictions: No    Mobility  Bed Mobility               General bed mobility comments: Sitting EOB upon PT arrival  Transfers Overall transfer level: Modified independent Equipment used: Rolling walker (2 wheeled) Transfers: Sit to/from Stand Sit to Stand: Modified independent (Device/Increase time)         General transfer comment: Able to stand with physical assistance, use of RW for balance in standing.  Ambulation/Gait Ambulation/Gait assistance: Supervision Ambulation Distance (Feet): 150 Feet Assistive device: Rolling walker (2 wheeled) Gait Pattern/deviations: Step-through pattern;Decreased dorsiflexion - right;Decreased dorsiflexion - left;Drifts right/left (Bil circumduction)   Gait velocity interpretation: Below  normal speed for age/gender General Gait Details: Moderately paced gait with circumducting gait pattern and decreased toe off. At times patient circumducting more L LE than R LE. Required use of RW to be steady, which he reports is not his baseline.    Stairs Stairs: Yes Stairs assistance: Supervision Stair Management: Two rails;Forwards;Step to pattern Number of Stairs: 3 General stair comments: Heavy use of rails to pull self to next stair. However, patient was safe, no SOB or LOB. Reports that niece will be available upon d/c to supervise and help with RW.  Wheelchair Mobility    Modified Rankin (Stroke Patients Only)       Balance Overall balance assessment: Needs assistance Sitting-balance support: Feet supported;No upper extremity supported Sitting balance-Leahy Scale: Good     Standing balance support: Bilateral upper extremity supported;During functional activity Standing balance-Leahy Scale: Poor Standing balance comment: Relies on RW for standing balance                    Cognition Arousal/Alertness: Awake/alert Behavior During Therapy: WFL for tasks assessed/performed Overall Cognitive Status: Within Functional Limits for tasks assessed                      Exercises  C/o general leg stiffness but reports he is familiar with general LE PT exercises and "will do them later."    General Comments        Pertinent Vitals/Pain Pain Assessment: No/denies pain    Home Living                      Prior Function  PT Goals (current goals can now be found in the care plan section) Acute Rehab PT Goals Patient Stated Goal: Go home PT Goal Formulation: With patient Time For Goal Achievement: 09/17/15 Potential to Achieve Goals: Good Progress towards PT goals: Progressing toward goals;Goals met and updated - see care plan    Frequency  Min 3X/week    PT Plan Current plan remains appropriate    Co-evaluation              End of Session Equipment Utilized During Treatment: Gait belt Activity Tolerance: Patient tolerated treatment well Patient left: in bed;with call bell/phone within reach     Time: 0855-0912 PT Time Calculation (min) (ACUTE ONLY): 17 min  Charges:  $Gait Training: 8-22 mins                    G CodesRoanna Epley, SPT [phone] 09/03/2015, 10:16 AM  I have read, reviewed and agree with student's note.   [place] [phone] (pager)

## 2015-09-03 NOTE — Progress Notes (Signed)
D/c instructions reviewed with pt, copy of instructions given to pt. Pt d/c'd via wheelchair with belongings, escorted by unit staff.

## 2015-09-03 NOTE — Progress Notes (Signed)
ANTICOAGULATION CONSULT NOTE - Follow Up Consult  Pharmacy Consult for heparin Indication: atrial fibrillation   Labs:  Recent Labs  08/31/15 0718  09/01/15 0314  09/02/15 0804 09/02/15 1155 09/02/15 1529 09/02/15 1834 09/03/15 0134 09/03/15 0138  HGB 10.8*  --  10.7*  --   --  11.6*  --   --  11.9*  --   HCT 33.9*  --  32.6*  --   --  34.8*  --   --  35.5*  --   PLT 237  --  233  --   --  275  --   --  240  --   APTT 115*  --   --   --   --   --   --   --   --   --   HEPARINUNFRC 0.56  < > <0.10*  < > 0.23*  --   --  0.21*  --  0.54  CREATININE 1.37*  < > 1.90*  < >  --  2.28* 2.19*  --  2.29*  --   < > = values in this interval not displayed.   Assessment/Plan:  64yo male therapeutic on heparin after rate changes. Will continue gtt at current rate and confirm stable with additional level.   Vernard Gambles, PharmD, BCPS  09/03/2015,2:41 AM

## 2015-09-03 NOTE — Progress Notes (Signed)
Family Medicine Teaching Service Daily Progress Note Intern Pager: [phone]  Patient name: Frank Davidson Medical record number: [id number] Date of birth: 04/20/1951 Age: 64 y.o. Gender: male  Primary Care Provider: Abbe Amsterdam, MD Consultants: cardiology, nephrology, CCM Code Status: full  Pt Overview and Major Events to Date:  10/05-ICU with fluid overload, hyperkalemia, uremia. Started on CVVHD 10/09-off CVVHD 10/11-transferred to floor. Assessment and Plan: Frank Davidson is 64yo male with hx COPD, HTN, DM, AFib, CKD III, OSA on CPAP and CHF. Presented 10/5 with SOB and weight gain over two weeks that has acutely gotten worse over two days. Found to be hypotensive. Weight up from 222 lbs to 242 lbs, K 6.2, Cr 7.1 (baseline 2.4-2.8). BUN 86. He was briefly admitted to FPTS and transferred to ICU for CVVHD. Patient was on herbal diuretics "kidney cleanse" for two weeks before presentation.  In ICU patient had Central Venous Trialysis Catheter placed on 08/27/2015 and recieved CVVHD (10/5-10/9). Over the course, weight down 27 lbs, creatinine down to 1.8, BUN down to 13, K down to 3.6. His breathing and urine output improved significantly. He was observed off CVVHD on 10/10 and transferred to floor on 10/11.  Dyspnea from volume overload- resolved. Looks euvolimic. No crackles on lung exam. Cardiorenal syndrome (AoCKD + acute on chronic systolic CHF) vs. COPD. Has hx of OSA as well. UOP 1.1L. Net -0.4L. -Continue home torsemide 40 mg bid -BMP in the morning -Supplemental O2 as necessary.  AoCKD - Baseline creatinine 2.4-2.8. Admit creatinine 7.1 s/p CVVHD in ICU. Cr 2.29. UOP 1.1L. Net -0.4L. - trend BMP  Acute on Chronic Systolic CHF r/t NICM. Echo with EF 25-30%, diffuse hypokinesis, mild to mod dilatation of all chambers. Valves normal. On coreg 12.5 mg bid, hydralazine 50 mg tid, torsemide 40 mg bid, spiro 12.5 dialy, metolazone 5 mg prn at home. All held while in  ICU. -increased coreg to 6.25 bid -Hold hydralazine today -torsemide 40 mg bid -strict I&O's  PAF: on Apixaban 5 mg at home. On heparin drip -restart home Apixiaban  HTN: blood pressure on the low side with tachycardia -hold hydralazine today -Increase coreg to 6.25 mg bid  COPD: stable. On albuterol  Gout: on allopurinol 100 mg daily at home -will resume on discharge  Osteoarthritis: on flector patch, baclofen and percocet at home. -continue home meds  DM-2: A1c 5.6 in 12/2014. On glipizide 5 mg bid at home. May not need if A1c within normal range -SSI  Anemia of chronic disease: Hgb 10.7. Baseline 11-12. Appears stable.  Electrolytes imbalance (K, Mg, P): repleted and resolved.  FEN/GI: -heart health diet -Docusate 100 mg bid -Famotidine 20 mg bid -Zofran prn  PPX: heparin per pharmacy.  -On home Apixiaban  Access: D/cd central line and foley 10/11.  Disposition: floor. Adjusting BP meds. Possible discharge home tomorrow with home-aid nurse and PT  Subjective:  No complaints overnight other than being awakened for blood draw. Denies shortness of breath, chest pain. Has good urine output. Up and walking with PT.  Objective: Temp:  [97.4 F (36.3 C)-97.9 F (36.6 C)] 97.9 F (36.6 C) (10/12 0438) Pulse Rate:  [102-110] 107 (10/12 0438) Resp:  [20] 20 (10/12 0438) BP: (100-120)/(62-80) 120/62 mmHg (10/12 0438) SpO2:  [99 %-100 %] 100 % (10/12 0438) Weight:  [208 lb 8 oz (94.575 kg)] 208 lb 8 oz (94.575 kg) (10/12 0438) Physical Exam: Gen: sitting in chair, NAD, well-appearing  Oropharynx: clear, moist Neck: no JVD CV: RRR. S1 & S2,  2/6 SEM over RUSB Resp: no apparent WOB, diminished air movement on the right, CTAB. Abd: +BS. Soft, NDNT, no rebound or guarding.  Ext: No edema or gross deformities. Neuro: Alert and oriented, No gross focal deficits  Laboratory:  Recent Labs Lab 09/01/15 0314 09/02/15 1155 09/03/15 0134  WBC 11.9* 11.2* 9.8  HGB  10.7* 11.6* 11.9*  HCT 32.6* 34.8* 35.5*  PLT 233 275 240    Recent Labs Lab 08/28/15 0400  09/01/15 0314  09/02/15 1155 09/02/15 1529 09/03/15 0134  NA 126*  < > 133*  < > 129* 132* 129*  K 3.9  < > 3.4*  < > 3.3* 3.4* 3.9  CL 91*  < > 93*  < > 89* 92* 91*  CO2 25  < > 28  < > 27 28 26   BUN 49*  < > 18  < > 31* 33* 36*  CREATININE 3.74*  < > 1.90*  < > 2.28* 2.19* 2.29*  CALCIUM 8.1*  < > 9.5  < > 9.4 9.5 9.7  PROT 6.8  --  6.9  --   --   --   --   BILITOT 0.5  --  0.7  --   --   --   --   ALKPHOS 74  --  63  --   --   --   --   ALT 18  --  16*  --   --   --   --   AST 28  --  27  --   --   --   --   GLUCOSE 141*  < > 117*  < > 109* 123* 112*  < > = values in this interval not displayed.   Imaging/Diagnostic Tests: No results found.  Almon Hercules, MD 09/03/2015, 6:48 AM PGY-1, [facility] FPTS Intern pager: [phone], text pages welcome

## 2015-09-03 NOTE — Discharge Instructions (Signed)
It has been a pleasure taking care of you! You were admitted due difficulty breathing and weight gain. We dialyzed you to take away the excess fluid that helped your heart and kidney to function better. We have also made some adjustments to your medication. The names and directions on what and how to take the medications are found on this discharge paper under medication section. Please, make sure you read the directions before you take these medications. Your primary doctor may make adjustments to your medication as needed at your follow up appointment.  You also need a follow up with your PCP. The address, date and time are found on the discharge paper under follow up section.  Take care,  Acute Kidney Injury Acute kidney injury is any condition in which there is sudden (acute) damage to the kidneys. Acute kidney injury was previously known as acute kidney failure or acute renal failure. The kidneys are two organs that lie on either side of the spine between the middle of the back and the front of the abdomen. The kidneys:  Remove wastes and extra water from the blood.   Produce important hormones. These help keep bones strong, regulate blood pressure, and help create red blood cells.   Balance the fluids and chemicals in the blood and tissues. A small amount of kidney damage may not cause problems, but a large amount of damage may make it difficult or impossible for the kidneys to work the way they should. Acute kidney injury may develop into long-lasting (chronic) kidney disease. It may also develop into a life-threatening disease called end-stage kidney disease. Acute kidney injury can get worse very quickly, so it should be treated right away. Early treatment may prevent other kidney diseases from developing. CAUSES   A problem with blood flow to the kidneys. This may be caused by:   Blood loss.   Heart disease.   Severe burns.   Liver disease.  Direct damage to the kidneys.  This may be caused by:  Some medicines.   A kidney infection.   Poisoning or consuming toxic substances.   A surgical wound.   A blow to the kidney area.   A problem with urine flow. This may be caused by:   Cancer.   Kidney stones.   An enlarged prostate. SIGNS AND SYMPTOMS   Swelling (edema) of the legs, ankles, or feet.   Tiredness (lethargy).   Nausea or vomiting.   Confusion.   Problems with urination, such as:   Painful or burning feeling during urination.   Decreased urine production.   Frequent accidents in children who are potty trained.   Bloody urine.   Muscle twitches and cramps.   Shortness of breath.   Seizures.   Chest pain or pressure. Sometimes, no symptoms are present. DIAGNOSIS Acute kidney injury may be detected and diagnosed by tests, including blood, urine, imaging, or kidney biopsy tests.  TREATMENT Treatment of acute kidney injury varies depending on the cause and severity of the kidney damage. In mild cases, no treatment may be needed. The kidneys may heal on their own. If acute kidney injury is more severe, your health care provider will treat the cause of the kidney damage, help the kidneys heal, and prevent complications from occurring. Severe cases may require a procedure to remove toxic wastes from the body (dialysis) or surgery to repair kidney damage. Surgery may involve:   Repair of a torn kidney.   Removal of an obstruction. HOME CARE INSTRUCTIONS  Follow your prescribed diet.  Take medicines only as directed by your health care provider.  Do not take any new medicines (prescription, over-the-counter, or nutritional supplements) unless approved by your health care provider. Many medicines can worsen your kidney damage or may need to have the dose adjusted.   Keep all follow-up visits as directed by your health care provider. This is important.  Observe your condition to make sure you are healing  as expected. SEEK IMMEDIATE MEDICAL CARE IF:  You are feeling ill or have severe pain in the back or side.   Your symptoms return or you have new symptoms.  You have any symptoms of end-stage kidney disease. These include:   Persistent itchiness.   Loss of appetite.   Headaches.   Abnormally dark or light skin.  Numbness in the hands or feet.   Easy bruising.   Frequent hiccups.   Menstruation stops.   You have a fever.  You have increased urine production.  You have pain or bleeding when urinating. MAKE SURE YOU:   Understand these instructions.  Will watch your condition.  Will get help right away if you are not doing well or get worse.   This information is not intended to replace advice given to you by your health care provider. Make sure you discuss any questions you have with your health care provider.   Document Released: 05/24/2011 Document Revised: 11/29/2014 Document Reviewed: 07/07/2012 Elsevier Interactive Patient Education 2016 [employer]   Heart Failure Heart failure is a condition in which the heart has trouble pumping blood. This means your heart does not pump blood efficiently for your body to work well. In some cases of heart failure, fluid may back up into your lungs or you may have swelling (edema) in your lower legs. Heart failure is usually a long-term (chronic) condition. It is important for you to take good care of yourself and follow your health care provider's treatment plan. CAUSES  Some health conditions can cause heart failure. Those health conditions include:  High blood pressure (hypertension). Hypertension causes the heart muscle to work harder than normal. When pressure in the blood vessels is high, the heart needs to pump (contract) with more force in order to circulate blood throughout the body. High blood pressure eventually causes the heart to become stiff and weak.  Coronary artery disease (CAD). CAD is the buildup  of cholesterol and fat (plaque) in the arteries of the heart. The blockage in the arteries deprives the heart muscle of oxygen and blood. This can cause chest pain and may lead to a heart attack. High blood pressure can also contribute to CAD.  Heart attack (myocardial infarction). A heart attack occurs when one or more arteries in the heart become blocked. The loss of oxygen damages the muscle tissue of the heart. When this happens, part of the heart muscle dies. The injured tissue does not contract as well and weakens the heart's ability to pump blood.  Abnormal heart valves. When the heart valves do not open and close properly, it can cause heart failure. This makes the heart muscle pump harder to keep the blood flowing.  Heart muscle disease (cardiomyopathy or myocarditis). Heart muscle disease is damage to the heart muscle from a variety of causes. These can include drug or alcohol abuse, infections, or unknown reasons. These can increase the risk of heart failure.  Lung disease. Lung disease makes the heart work harder because the lungs do not work properly. This can cause  a strain on the heart, leading it to fail.  Diabetes. Diabetes increases the risk of heart failure. High blood sugar contributes to high fat (lipid) levels in the blood. Diabetes can also cause slow damage to tiny blood vessels that carry important nutrients to the heart muscle. When the heart does not get enough oxygen and food, it can cause the heart to become weak and stiff. This leads to a heart that does not contract efficiently.  Other conditions can contribute to heart failure. These include abnormal heart rhythms, thyroid problems, and low blood counts (anemia). Certain unhealthy behaviors can increase the risk of heart failure, including:  Being overweight.  Smoking or chewing tobacco.  Eating foods high in fat and cholesterol.  Abusing illicit drugs or alcohol.  Lacking physical activity. SYMPTOMS  Heart  failure symptoms may vary and can be hard to detect. Symptoms may include:  Shortness of breath with activity, such as climbing stairs.  Persistent cough.  Swelling of the feet, ankles, legs, or abdomen.  Unexplained weight gain.  Difficulty breathing when lying flat (orthopnea).  Waking from sleep because of the need to sit up and get more air.  Rapid heartbeat.  Fatigue and loss of energy.  Feeling light-headed, dizzy, or close to fainting.  Loss of appetite.  Nausea.  Increased urination during the night (nocturia). DIAGNOSIS  A diagnosis of heart failure is based on your history, symptoms, physical examination, and diagnostic tests. Diagnostic tests for heart failure may include:  Echocardiography.  Electrocardiography.  Chest X-ray.  Blood tests.  Exercise stress test.  Cardiac angiography.  Radionuclide scans. TREATMENT  Treatment is aimed at managing the symptoms of heart failure. Medicines, behavioral changes, or surgical intervention may be necessary to treat heart failure.  Medicines to help treat heart failure may include:  Angiotensin-converting enzyme (ACE) inhibitors. This type of medicine blocks the effects of a blood protein called angiotensin-converting enzyme. ACE inhibitors relax (dilate) the blood vessels and help lower blood pressure.  Angiotensin receptor blockers (ARBs). This type of medicine blocks the actions of a blood protein called angiotensin. Angiotensin receptor blockers dilate the blood vessels and help lower blood pressure.  Water pills (diuretics). Diuretics cause the kidneys to remove salt and water from the blood. The extra fluid is removed through urination. This loss of extra fluid lowers the volume of blood the heart pumps.  Beta blockers. These prevent the heart from beating too fast and improve heart muscle strength.  Digitalis. This increases the force of the heartbeat.  Healthy behavior changes include:  Obtaining  and maintaining a healthy weight.  Stopping smoking or chewing tobacco.  Eating heart-healthy foods.  Limiting or avoiding alcohol.  Stopping illicit drug use.  Physical activity as directed by your health care provider.  Surgical treatment for heart failure may include:  A procedure to open blocked arteries, repair damaged heart valves, or remove damaged heart muscle tissue.  A pacemaker to improve heart muscle function and control certain abnormal heart rhythms.  An internal cardioverter defibrillator to treat certain serious abnormal heart rhythms.  A left ventricular assist device (LVAD) to assist the pumping ability of the heart. HOME CARE INSTRUCTIONS   Take medicines only as directed by your health care provider. Medicines are important in reducing the workload of your heart, slowing the progression of heart failure, and improving your symptoms.  Do not stop taking your medicine unless directed by your health care provider.  Do not skip any dose of medicine.  Refill your  prescriptions before you run out of medicine. Your medicines are needed every day.  Engage in moderate physical activity if directed by your health care provider. Moderate physical activity can benefit some people. The elderly and people with severe heart failure should consult with a health care provider for physical activity recommendations.  Eat heart-healthy foods. Food choices should be free of trans fat and low in saturated fat, cholesterol, and salt (sodium). Healthy choices include fresh or frozen fruits and vegetables, fish, lean meats, legumes, fat-free or low-fat dairy products, and whole grain or high fiber foods. Talk to a dietitian to learn more about heart-healthy foods.  Limit sodium if directed by your health care provider. Sodium restriction may reduce symptoms of heart failure in some people. Talk to a dietitian to learn more about heart-healthy seasonings.  Use healthy cooking methods.  Healthy cooking methods include roasting, grilling, broiling, baking, poaching, steaming, or stir-frying. Talk to a dietitian to learn more about healthy cooking methods.  Limit fluids if directed by your health care provider. Fluid restriction may reduce symptoms of heart failure in some people.  Weigh yourself every day. Daily weights are important in the early recognition of excess fluid. You should weigh yourself every morning after you urinate and before you eat breakfast. Wear the same amount of clothing each time you weigh yourself. Record your daily weight. Provide your health care provider with your weight record.  Monitor and record your blood pressure if directed by your health care provider.  Check your pulse if directed by your health care provider.  Lose weight if directed by your health care provider. Weight loss may reduce symptoms of heart failure in some people.  Stop smoking or chewing tobacco. Nicotine makes your heart work harder by causing your blood vessels to constrict. Do not use nicotine gum or patches before talking to your health care provider.  Keep all follow-up visits as directed by your health care provider. This is important.  Limit alcohol intake to no more than 1 drink per day for nonpregnant women and 2 drinks per day for men. One drink equals 12 ounces of beer, 5 ounces of wine, or 1 ounces of hard liquor. Drinking more than that is harmful to your heart. Tell your health care provider if you drink alcohol several times a week. Talk with your health care provider about whether alcohol is safe for you. If your heart has already been damaged by alcohol or you have severe heart failure, drinking alcohol should be stopped completely.  Stop illicit drug use.  Stay up-to-date with immunizations. It is especially important to prevent respiratory infections through current pneumococcal and influenza immunizations.  Manage other health conditions such as  hypertension, diabetes, thyroid disease, or abnormal heart rhythms as directed by your health care provider.  Learn to manage stress.  Plan rest periods when fatigued.  Learn strategies to manage high temperatures. If the weather is extremely hot:  Avoid vigorous physical activity.  Use air conditioning or fans or seek a cooler location.  Avoid caffeine and alcohol.  Wear loose-fitting, lightweight, and light-colored clothing.  Learn strategies to manage cold temperatures. If the weather is extremely cold:  Avoid vigorous physical activity.  Layer clothes.  Wear mittens or gloves, a hat, and a scarf when going outside.  Avoid alcohol.  Obtain ongoing education and support as needed.  Participate in or seek rehabilitation as needed to maintain or improve independence and quality of life. SEEK MEDICAL CARE IF:   You  have a rapid weight gain.  You have increasing shortness of breath that is unusual for you.  You are unable to participate in your usual physical activities.  You tire easily.  You cough more than normal, especially with physical activity.  You have any or more swelling in areas such as your hands, feet, ankles, or abdomen.  You are unable to sleep because it is hard to breathe.  You feel like your heart is beating fast (palpitations).  You become dizzy or light-headed upon standing up. SEEK IMMEDIATE MEDICAL CARE IF:   You have difficulty breathing.  There is a change in mental status such as decreased alertness or difficulty with concentration.  You have a pain or discomfort in your chest.  You have an episode of fainting (syncope). MAKE SURE YOU:   Understand these instructions.  Will watch your condition.  Will get help right away if you are not doing well or get worse.   This information is not intended to replace advice given to you by your health care provider. Make sure you discuss any questions you have with your health care  provider.   Document Released: 11/08/2005 Document Revised: 03/25/2015 Document Reviewed: 12/08/2012 [employer] Interactive Patient Education [employer]

## 2015-09-04 ENCOUNTER — Ambulatory Visit (INDEPENDENT_AMBULATORY_CARE_PROVIDER_SITE_OTHER): Payer: Commercial Managed Care - HMO | Admitting: Family Medicine

## 2015-09-04 VITALS — BP 136/90 | HR 110 | Temp 98.1°F | Resp 20 | Ht 67.0 in | Wt 212.0 lb

## 2015-09-04 DIAGNOSIS — I509 Heart failure, unspecified: Secondary | ICD-10-CM

## 2015-09-04 DIAGNOSIS — E1122 Type 2 diabetes mellitus with diabetic chronic kidney disease: Secondary | ICD-10-CM

## 2015-09-04 DIAGNOSIS — N183 Chronic kidney disease, stage 3 unspecified: Secondary | ICD-10-CM

## 2015-09-04 DIAGNOSIS — G894 Chronic pain syndrome: Secondary | ICD-10-CM | POA: Diagnosis not present

## 2015-09-04 DIAGNOSIS — Z5181 Encounter for therapeutic drug level monitoring: Secondary | ICD-10-CM | POA: Diagnosis not present

## 2015-09-04 DIAGNOSIS — G4733 Obstructive sleep apnea (adult) (pediatric): Secondary | ICD-10-CM | POA: Diagnosis not present

## 2015-09-04 LAB — COMPLETE METABOLIC PANEL WITH GFR
ALT: 22 U/L (ref 9–46)
CO2: 27 mmol/L (ref 20–31)
Calcium: 10.3 mg/dL (ref 8.6–10.3)
Chloride: 93 mmol/L — ABNORMAL LOW (ref 98–110)
GFR, Est African American: 29 mL/min — ABNORMAL LOW (ref >=60)
GFR, Est Non African American: 25 mL/min — ABNORMAL LOW (ref >=60)
Potassium: 4.4 mmol/L (ref 3.5–5.3)

## 2015-09-04 LAB — COMPLETE METABOLIC PANEL WITHOUT GFR
AST: 34 U/L (ref 10–35)
Albumin: 4.1 g/dL (ref 3.6–5.1)
Alkaline Phosphatase: 76 U/L (ref 40–115)
BUN: 42 mg/dL — ABNORMAL HIGH (ref 7–25)
Creat: 2.57 mg/dL — ABNORMAL HIGH (ref 0.70–1.25)
Glucose, Bld: 122 mg/dL — ABNORMAL HIGH (ref 65–99)
Sodium: 132 mmol/L — ABNORMAL LOW (ref 135–146)
Total Bilirubin: 0.7 mg/dL (ref 0.2–1.2)
Total Protein: 8.1 g/dL (ref 6.1–8.1)

## 2015-09-04 MED ORDER — OXYCODONE-ACETAMINOPHEN 10-325 MG PO TABS: 1.0000 | ORAL_TABLET | Freq: Four times a day (QID) | ORAL | Status: DC

## 2015-09-04 NOTE — Telephone Encounter (Signed)
Patient is here now to be seen 

## 2015-09-04 NOTE — Progress Notes (Signed)
Chief Complaint:  Chief Complaint  Patient presents with  . Hospitalization Follow-up    states took rx for percocet to hospital and lost it / has been in hospital for CHF   . Congestive Heart Failure    states his kidney functions returned after dialysis     HPI: Frank Davidson is a 64 y.o. male who reports to [facility] today complaining of hospital from 10/5-10/12/16. He was sent to the ER for acute on chronic CHF, he is doing ok now, he is back on regular meds, he was 208 several days ago at home  and is now 211 in office . He denies any CP or SOB or wheezing, PND or orthopena. He does not have any leg swelling. He also ahs hip pain and and also OA so is really here for a refill for his pain meds, he was given a prescription before he went int o the ER and then got hospitalized and during the stay lost his prescription. He is being referred to pain management but has not heard about appt.    Renal doctor appt 09/08/15 CHF doctor 09/10/2015 OSA not on CPAP, he had 3 and 1 person said he has OSA, 1 said no , and the study was inconclusive. He states one of the resaons the study was inconclusive was that he had to urinate so much at nightdue to diuretic.    He states his scale at home has him at 208 but here it is 211   SpO2 Readings from Last 3 Encounters:  09/04/15 97%  09/03/15 97%  08/27/15 93%   BP Readings from Last 3 Encounters:  09/04/15 136/90  09/03/15 121/69  08/27/15 86/44    Wt Readings from Last 3 Encounters:  09/04/15 212 lb (96.163 kg)  09/03/15 208 lb 8 oz (94.575 kg)  08/27/15 246 lb 11.2 oz (111.902 kg)    DC diagnoses: Discharge Diagnoses/Problem List:  Patient Active Problem List   Diagnosis Date Noted  . Acute on chronic systolic congestive heart failure (HCC)   . Type 2 diabetes mellitus with stage 4 chronic kidney disease, without long-term current use of insulin (HCC)   . Nausea & vomiting   . Acute on chronic systolic CHF  (congestive heart failure) (HCC)   . AKI (acute kidney injury) (HCC)   . Acute kidney injury (HCC) 08/27/2015  . Arterial hypotension   . Hip pain 07/23/2015  . Acute exacerbation of CHF (congestive heart failure) (HCC)   . Dyspnea   . Acute renal failure superimposed on stage 3 chronic kidney disease (HCC) 07/01/2015  . Herpes ocular   . Acute on chronic systolic heart failure (HCC) 06/19/2015  . Facial rash 06/19/2015  . Diabetes mellitus type 2, controlled (HCC) 06/19/2015  . Essential hypertension 06/19/2015  . Chronic anemia 06/19/2015  . CHF (congestive heart failure) (HCC) 06/19/2015  . CHF exacerbation (HCC) 06/18/2015  . Non-ischemic cardiomyopathy- EF 15% echo Aug 2015 11/24/2014  . Chronic anticoagulation 11/24/2014  . S/P left THA, AA 09/12/2014  . Gout 04/29/2014  . Ocular herpes   . Neuropathy (HCC)   . OSA (obstructive sleep apnea) 07/09/2013  . Chronic systolic heart failure (HCC) 07/05/2013  . 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 PAF (paroxysmal atrial fibrillation) (HCC) 07/03/2013  . Hypertension   . COPD (chronic obstructive pulmonary disease) (HCC)   . Diabetes mellitus with nephropathy   . CKD (chronic kidney disease), stage III   .  Polysubstance abuse             Past Medical History  Diagnosis Date  . Atrial fibrillation (HCC)     PAF  . Hypertension   . COPD (chronic obstructive pulmonary disease) (HCC)   . Ocular herpes   . Gout   . CHF (congestive heart failure) (HCC)   . Chronic pain   . Hyperlipidemia   . Nonischemic cardiomyopathy (HCC) Aug 2015    EF 15%  . Ocular herpes   . Neuropathy (HCC)   . Left-sided weakness     "because of the arthritis and edema"  . PVD (peripheral vascular disease) [facility]) May 2015    abnormal ABIs  . Memory impairment     "short term"  . CKD (chronic kidney disease), stage III     Dr. Briant Cedar  follows-holding   . Fibromyalgia     neuropathy- hands, more than feet.  . Sleep apnea with use of continuous positive airway pressure (CPAP)     does not use cpap  . GERD (gastroesophageal reflux disease)     tums as needed  . Asthma   . Type II diabetes mellitus (HCC)   . Stroke [facility]) X 5    last stroke aug 2014"; denies residual on 09/12/2014  . Osteoarthritis   . Arthritis     "knees, right elbow, shoulder" (09/12/2014)  . Shortness of breath dyspnea    Past Surgical History  Procedure Laterality Date  . 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 Tonsillectomy  age 10  . Esophagogastroduodenoscopy N/A 12/24/2013    Procedure: ESOPHAGOGASTRODUODENOSCOPY (EGD);  Surgeon: Louis Meckel, MD;  Location: [facility];  Service: Endoscopy;  Laterality: N/A;  . Colonoscopy N/A 12/24/2013    Procedure: COLONOSCOPY;  Surgeon: Louis Meckel, MD;  Location: [facility];  Service: Endoscopy;  Laterality: N/A;  . Joint replacement    . Cardiac catheterization  10/2006    normal coronary arteries;   Marland Kitchen Cataract extraction w/ intraocular lens  implant, bilateral Bilateral ~ 2008  . Total hip arthroplasty Left 09/12/2014    Procedure: LEFT TOTAL HIP ARTHROPLASTY ANTERIOR APPROACH;  Surgeon: [name], MD;  Location[facility];  Service: Orthopedics;  Laterality: Left;  . Right heart catheterization N/A 08/13/2014    Rt ht cath prior to hip surgery   Social History   Social History  . Marital Status: Divorced    Spouse Name: N/A  . Number of Children: N/A  . Years of Education: N/A   Social History Main Topics  . Smoking status: Former Smoker -- 0.00 packs/day for 0 years    Types: Cigarettes    Quit date: 12/18/2006  . Smokeless tobacco: Never Used  . Alcohol Use: No     Comment: Quit 2008  . Drug Use: Yes    Special: Cocaine, Marijuana     Comment: last used marijuana and cocaine 2008  . Sexual Activity: Not Asked   Other Topics Concern  . None   Social History Narrative    Family History  Problem Relation Age of Onset  . Cancer Mother   . Hypertension Mother   . Lung disease Mother   . Diabetes Father   . Diabetes Sister   . Hypertension Sister   . Hypertension Brother   . Hypertension Sister   . Colon cancer Neg Hx    Allergies  Allergen Reactions  . Corlanor [Ivabradine] Palpitations and Other (See Comments)  Kidney and heart problems, syncope  . Entresto [Sacubitril-Valsartan]     Syncope  . Januvia [Sitagliptin] Other (See Comments)    "Almost killed me"   . Lunesta [Eszopiclone] Palpitations and Other (See Comments)    Kidney and heart problems, syncope  . Actos [Pioglitazone] Other (See Comments)    Caused blood in urine and fluid retention    Prior to Admission medications   Medication Sig Start Date End Date Taking? Authorizing Provider  albuterol (PROVENTIL HFA;VENTOLIN HFA) 108 (90 BASE) MCG/ACT inhaler Inhale 2 puffs into the lungs every 6 (six) hours as needed for wheezing or shortness of breath. 06/30/15  Yes Gwenlyn Found Copland, MD  allopurinol (ZYLOPRIM) 100 MG tablet Take 100 mg by mouth daily.   Yes Historical Provider, MD  apixaban (ELIQUIS) 5 MG TABS tablet Take 1 tablet (5 mg total) by mouth 2 (two) times daily. 02/04/15  Yes Dolores Patty, MD  baclofen (LIORESAL) 10 MG tablet TAKE 1 TABLET EVERY 8 HOURS AS NEEDED FOR MUSCLE SPASM(S) 07/23/15  Yes Gwenlyn Found Copland, MD  carvedilol (COREG) 12.5 MG tablet Take 1 tablet (12.5 mg total) by mouth 2 (two) times daily with a meal. 09/03/15  Yes Almon Hercules, MD  diclofenac (FLECTOR) 1.3 % PTCH Place 1 patch onto the skin 2 (two) times daily as needed (pain).   Yes Historical Provider, MD  docusate sodium 100 MG CAPS Take 100 mg by mouth 2 (two) times daily. 09/16/14  Yes Lanney Gins, PA-C  omega-3 acid ethyl esters (LOVAZA) 1 G capsule Take 1 g by mouth daily.   Yes Historical Provider, MD  oxyCODONE-acetaminophen (PERCOCET) 10-325 MG tablet Take 1 tablet by mouth 4 (four) times  daily. scheduled 08/22/15  Yes Jessica C Copland, MD  polyethylene glycol powder (GLYCOLAX/MIRALAX) powder MIX 1 CAPFUL (17GM)  IN  LIQUID  AND  DRINK EVERY DAY AS DIRECTED 07/31/15  Yes Jessica C Copland, MD  potassium chloride SA (K-DUR,KLOR-CON) 20 MEQ tablet Take 1 tablet (20 mEq total) by mouth daily. 08/08/15  Yes Dolores Patty, MD  prednisoLONE acetate (PRED FORTE) 1 % ophthalmic suspension Place 1 drop into both eyes 4 (four) times daily.   Yes Historical Provider, MD  torsemide (DEMADEX) 20 MG tablet TAKE 2 TABLETS BY MOUTH 2 TIMES DAILY 08/11/15  Yes Gwenlyn Found Copland, MD     ROS: The patient denies fevers, chills, night sweats, unintentional weight loss, chest pain, palpitations, wheezing, dyspnea on exertion, nausea, vomiting, abdominal pain, dysuria, hematuria, melena  All other systems have been reviewed and were otherwise negative with the exception of those mentioned in the HPI and as above.    PHYSICAL EXAM: Filed Vitals:   09/04/15 0916  BP: 136/90  Pulse: 110  Temp: 98.1 F (36.7 C)  Resp: 20   Body mass index is 33.2 kg/(m^2).   General: Alert, no acute distress HEENT:  Normocephalic, atraumatic, oropharynx patent. EOMI, PERRLA Cardiovascular:  Regular rate and rhythm, no rubs murmurs or gallops.  No Carotid bruits, radial pulse intact. No pedal edema.  Respiratory: Clear to auscultation bilaterally.  No wheezes, rales, or rhonchi.  No cyanosis, no use of accessory musculature Abdominal: No organomegaly, abdomen is soft and non-tender, positive bowel sounds. No masses. Skin: No rashes. Neurologic: Facial musculature symmetric. Psychiatric: Patient acts appropriately throughout our interaction. Lymphatic: No cervical or submandibular lymphadenopathy Musculoskeletal: Gait intact. No edema, tenderness   LABS: Results for orders placed or performed during the hospital encounter of 08/27/15  Culture, blood (  routine x 2)  Result Value Ref Range   Specimen  Description BLOOD RIGHT ANTECUBITAL    Special Requests BOTTLES DRAWN AEROBIC AND ANAEROBIC 10CC    Culture NO GROWTH 5 DAYS    Report Status 09/01/2015 FINAL   Culture, blood (routine x 2)  Result Value Ref Range   Specimen Description BLOOD RIGHT HAND    Special Requests IN PEDIATRIC BOTTLE 3.5CC    Culture NO GROWTH 5 DAYS    Report Status 09/01/2015 FINAL   MRSA PCR Screening  Result Value Ref Range   MRSA by PCR NEGATIVE NEGATIVE  Basic metabolic panel  Result Value Ref Range   Sodium 122 (L) 135 - 145 mmol/L   Potassium 6.2 (HH) 3.5 - 5.1 mmol/L   Chloride 89 (L) 101 - 111 mmol/L   CO2 19 (L) 22 - 32 mmol/L   Glucose, Bld 72 65 - 99 mg/dL   BUN 86 (H) 6 - 20 mg/dL   Creatinine, Ser 1.61 (H) 0.61 - 1.24 mg/dL   Calcium 8.5 (L) 8.9 - 10.3 mg/dL   GFR calc non Af Amer 7 (L) >60 mL/min   GFR calc Af Amer 8 (L) >60 mL/min   Anion gap 14 5 - 15  CBC  Result Value Ref Range   WBC 6.8 4.0 - 10.5 K/uL   RBC 3.61 (L) 4.22 - 5.81 MIL/uL   Hemoglobin 10.3 (L) 13.0 - 17.0 g/dL   HCT 09.6 (L) 04.5 - 40.9 %   MCV 85.6 78.0 - 100.0 fL   MCH 28.5 26.0 - 34.0 pg   MCHC 33.3 30.0 - 36.0 g/dL   RDW 81.1 (H) 91.4 - 78.2 %   Platelets 271 150 - 400 K/uL  Brain natriuretic peptide  Result Value Ref Range   B Natriuretic Peptide 328.8 (H) 0.0 - 100.0 pg/mL  Basic metabolic panel  Result Value Ref Range   Sodium 124 (L) 135 - 145 mmol/L   Potassium 5.3 (H) 3.5 - 5.1 mmol/L   Chloride 90 (L) 101 - 111 mmol/L   CO2 20 (L) 22 - 32 mmol/L   Glucose, Bld 58 (L) 65 - 99 mg/dL   BUN 85 (H) 6 - 20 mg/dL   Creatinine, Ser 9.56 (H) 0.61 - 1.24 mg/dL   Calcium 8.6 (L) 8.9 - 10.3 mg/dL   GFR calc non Af Amer 7 (L) >60 mL/min   GFR calc Af Amer 8 (L) >60 mL/min   Anion gap 14 5 - 15  Iron and TIBC  Result Value Ref Range   Iron 63 45 - 182 ug/dL   TIBC 213 086 - 578 ug/dL   Saturation Ratios 18 17.9 - 39.5 %   UIBC 284 ug/dL  Urinalysis, Routine w reflex microscopic (not at [facility])  Result  Value Ref Range   Color, Urine YELLOW YELLOW   APPearance CLEAR CLEAR   Specific Gravity, Urine 1.017 1.005 - 1.030   pH 5.0 5.0 - 8.0   Glucose, UA NEGATIVE NEGATIVE mg/dL   Hgb urine dipstick NEGATIVE NEGATIVE   Bilirubin Urine NEGATIVE NEGATIVE   Ketones, ur NEGATIVE NEGATIVE mg/dL   Protein, ur NEGATIVE NEGATIVE mg/dL   Urobilinogen, UA 0.2 0.0 - 1.0 mg/dL   Nitrite NEGATIVE NEGATIVE   Leukocytes, UA NEGATIVE NEGATIVE  Sodium, urine, random  Result Value Ref Range   Sodium, Ur 12 mmol/L  Creatinine, urine, random  Result Value Ref Range   Creatinine, Urine 115.66 mg/dL  Hepatitis B surface antigen  Result  Value Ref Range   Hepatitis B Surface Ag Negative Negative  Parathyroid hormone, intact (no Ca)  Result Value Ref Range   PTH 199 (H) 15 - 65 pg/mL  Comprehensive metabolic panel  Result Value Ref Range   Sodium 126 (L) 135 - 145 mmol/L   Potassium 3.9 3.5 - 5.1 mmol/L   Chloride 91 (L) 101 - 111 mmol/L   CO2 25 22 - 32 mmol/L   Glucose, Bld 141 (H) 65 - 99 mg/dL   BUN 49 (H) 6 - 20 mg/dL   Creatinine, Ser 1.61 (H) 0.61 - 1.24 mg/dL   Calcium 8.1 (L) 8.9 - 10.3 mg/dL   Total Protein 6.8 6.5 - 8.1 g/dL   Albumin 3.2 (L) 3.5 - 5.0 g/dL   AST 28 15 - 41 U/L   ALT 18 17 - 63 U/L   Alkaline Phosphatase 74 38 - 126 U/L   Total Bilirubin 0.5 0.3 - 1.2 mg/dL   GFR calc non Af Amer 16 (L) >60 mL/min   GFR calc Af Amer 18 (L) >60 mL/min   Anion gap 10 5 - 15  CBC  Result Value Ref Range   WBC 7.1 4.0 - 10.5 K/uL   RBC 4.04 (L) 4.22 - 5.81 MIL/uL   Hemoglobin 11.4 (L) 13.0 - 17.0 g/dL   HCT 09.6 (L) 04.5 - 40.9 %   MCV 85.6 78.0 - 100.0 fL   MCH 28.2 26.0 - 34.0 pg   MCHC 32.9 30.0 - 36.0 g/dL   RDW 81.1 (H) 91.4 - 78.2 %   Platelets 290 150 - 400 K/uL  Magnesium  Result Value Ref Range   Magnesium 2.4 1.7 - 2.4 mg/dL  Phosphorus  Result Value Ref Range   Phosphorus 4.2 2.5 - 4.6 mg/dL  Heparin level (unfractionated)  Result Value Ref Range   Heparin  Unfractionated 0.73 (H) 0.30 - 0.70 IU/mL  APTT  Result Value Ref Range   aPTT 46 (H) 24 - 37 seconds  Magnesium  Result Value Ref Range   Magnesium 2.5 (H) 1.7 - 2.4 mg/dL  Carboxyhemoglobin  Result Value Ref Range   Total hemoglobin 8.0 (L) 13.5 - 18.0 g/dL   O2 Saturation 95.6 %   Carboxyhemoglobin 0.9 0.5 - 1.5 %   Methemoglobin 0.8 0.0 - 1.5 %  Basic metabolic panel  Result Value Ref Range   Sodium 129 (L) 135 - 145 mmol/L   Potassium 4.0 3.5 - 5.1 mmol/L   Chloride 95 (L) 101 - 111 mmol/L   CO2 23 22 - 32 mmol/L   Glucose, Bld 110 (H) 65 - 99 mg/dL   BUN 65 (H) 6 - 20 mg/dL   Creatinine, Ser 2.13 (H) 0.61 - 1.24 mg/dL   Calcium 8.3 (L) 8.9 - 10.3 mg/dL   GFR calc non Af Amer 11 (L) >60 mL/min   GFR calc Af Amer 13 (L) >60 mL/min   Anion gap 11 5 - 15  Glucose, capillary  Result Value Ref Range   Glucose-Capillary 128 (H) 65 - 99 mg/dL  Heparin level (unfractionated)  Result Value Ref Range   Heparin Unfractionated 1.00 (H) 0.30 - 0.70 IU/mL  Renal function panel (daily at 1600)  Result Value Ref Range   Sodium 134 (L) 135 - 145 mmol/L   Potassium 4.5 3.5 - 5.1 mmol/L   Chloride 99 (L) 101 - 111 mmol/L   CO2 28 22 - 32 mmol/L   Glucose, Bld 194 (H) 65 - 99 mg/dL  BUN 30 (H) 6 - 20 mg/dL   Creatinine, Ser 1.61 (H) 0.61 - 1.24 mg/dL   Calcium 8.4 (L) 8.9 - 10.3 mg/dL   Phosphorus 2.8 2.5 - 4.6 mg/dL   Albumin 3.1 (L) 3.5 - 5.0 g/dL   GFR calc non Af Amer 29 (L) >60 mL/min   GFR calc Af Amer 33 (L) >60 mL/min   Anion gap 7 5 - 15  Glucose, capillary  Result Value Ref Range   Glucose-Capillary 88 65 - 99 mg/dL   Comment 1 Capillary Specimen    Comment 2 Notify RN    Comment 3 Document in Chart   APTT  Result Value Ref Range   aPTT 195 (H) 24 - 37 seconds  APTT  Result Value Ref Range   aPTT 156 (H) 24 - 37 seconds  Glucose, capillary  Result Value Ref Range   Glucose-Capillary 119 (H) 65 - 99 mg/dL   Comment 1 Notify RN   Basic metabolic panel  Result  Value Ref Range   Sodium 132 (L) 135 - 145 mmol/L   Potassium 4.4 3.5 - 5.1 mmol/L   Chloride 98 (L) 101 - 111 mmol/L   CO2 28 22 - 32 mmol/L   Glucose, Bld 194 (H) 65 - 99 mg/dL   BUN 30 (H) 6 - 20 mg/dL   Creatinine, Ser 0.96 (H) 0.61 - 1.24 mg/dL   Calcium 8.4 (L) 8.9 - 10.3 mg/dL   GFR calc non Af Amer 28 (L) >60 mL/min   GFR calc Af Amer 32 (L) >60 mL/min   Anion gap 6 5 - 15  Magnesium  Result Value Ref Range   Magnesium 2.4 1.7 - 2.4 mg/dL  Phosphorus  Result Value Ref Range   Phosphorus 2.8 2.5 - 4.6 mg/dL  Glucose, capillary  Result Value Ref Range   Glucose-Capillary 120 (H) 65 - 99 mg/dL  APTT  Result Value Ref Range   aPTT 159 (H) 24 - 37 seconds  Renal function panel (daily at 0500)  Result Value Ref Range   Sodium 136 135 - 145 mmol/L   Potassium 4.5 3.5 - 5.1 mmol/L   Chloride 98 (L) 101 - 111 mmol/L   CO2 29 22 - 32 mmol/L   Glucose, Bld 150 (H) 65 - 99 mg/dL   BUN 15 6 - 20 mg/dL   Creatinine, Ser 0.45 (H) 0.61 - 1.24 mg/dL   Calcium 8.7 (L) 8.9 - 10.3 mg/dL   Phosphorus 2.0 (L) 2.5 - 4.6 mg/dL   Albumin 3.1 (L) 3.5 - 5.0 g/dL   GFR calc non Af Amer 45 (L) >60 mL/min   GFR calc Af Amer 53 (L) >60 mL/min   Anion gap 9 5 - 15  Magnesium  Result Value Ref Range   Magnesium 2.4 1.7 - 2.4 mg/dL  CBC  Result Value Ref Range   WBC 7.9 4.0 - 10.5 K/uL   RBC 3.98 (L) 4.22 - 5.81 MIL/uL   Hemoglobin 11.2 (L) 13.0 - 17.0 g/dL   HCT 40.9 (L) 81.1 - 91.4 %   MCV 85.9 78.0 - 100.0 fL   MCH 28.1 26.0 - 34.0 pg   MCHC 32.7 30.0 - 36.0 g/dL   RDW 78.2 (H) 95.6 - 21.3 %   Platelets 260 150 - 400 K/uL  Basic metabolic panel  Result Value Ref Range   Sodium 136 135 - 145 mmol/L   Potassium 4.5 3.5 - 5.1 mmol/L   Chloride 98 (L) 101 - 111  mmol/L   CO2 27 22 - 32 mmol/L   Glucose, Bld 148 (H) 65 - 99 mg/dL   BUN 15 6 - 20 mg/dL   Creatinine, Ser 1.61 (H) 0.61 - 1.24 mg/dL   Calcium 8.7 (L) 8.9 - 10.3 mg/dL   GFR calc non Af Amer 49 (L) >60 mL/min   GFR calc  Af Amer 57 (L) >60 mL/min   Anion gap 11 5 - 15  Phosphorus  Result Value Ref Range   Phosphorus 2.0 (L) 2.5 - 4.6 mg/dL  Glucose, capillary  Result Value Ref Range   Glucose-Capillary 174 (H) 65 - 99 mg/dL  Glucose, capillary  Result Value Ref Range   Glucose-Capillary 135 (H) 65 - 99 mg/dL   Comment 1 Capillary Specimen    Comment 2 Notify RN    Comment 3 Document in Chart   Renal function panel (daily at 1600)  Result Value Ref Range   Sodium 136 135 - 145 mmol/L   Potassium 4.6 3.5 - 5.1 mmol/L   Chloride 100 (L) 101 - 111 mmol/L   CO2 27 22 - 32 mmol/L   Glucose, Bld 148 (H) 65 - 99 mg/dL   BUN 11 6 - 20 mg/dL   Creatinine, Ser 0.96 (H) 0.61 - 1.24 mg/dL   Calcium 8.8 (L) 8.9 - 10.3 mg/dL   Phosphorus 2.3 (L) 2.5 - 4.6 mg/dL   Albumin 3.2 (L) 3.5 - 5.0 g/dL   GFR calc non Af Amer 57 (L) >60 mL/min   GFR calc Af Amer >60 >60 mL/min   Anion gap 9 5 - 15  APTT  Result Value Ref Range   aPTT 61 (H) 24 - 37 seconds  Glucose, capillary  Result Value Ref Range   Glucose-Capillary 129 (H) 65 - 99 mg/dL  APTT  Result Value Ref Range   aPTT >200 (HH) 24 - 37 seconds  Glucose, capillary  Result Value Ref Range   Glucose-Capillary 134 (H) 65 - 99 mg/dL   Comment 1 Capillary Specimen   Glucose, capillary  Result Value Ref Range   Glucose-Capillary 142 (H) 65 - 99 mg/dL  Renal function panel (daily at 0500)  Result Value Ref Range   Sodium 139 135 - 145 mmol/L   Potassium 4.8 3.5 - 5.1 mmol/L   Chloride 102 101 - 111 mmol/L   CO2 26 22 - 32 mmol/L   Glucose, Bld 129 (H) 65 - 99 mg/dL   BUN 8 6 - 20 mg/dL   Creatinine, Ser 0.45 (H) 0.61 - 1.24 mg/dL   Calcium 8.8 (L) 8.9 - 10.3 mg/dL   Phosphorus 2.0 (L) 2.5 - 4.6 mg/dL   Albumin 3.3 (L) 3.5 - 5.0 g/dL   GFR calc non Af Amer 50 (L) >60 mL/min   GFR calc Af Amer 58 (L) >60 mL/min   Anion gap 11 5 - 15  Heparin level (unfractionated)  Result Value Ref Range   Heparin Unfractionated 0.93 (H) 0.30 - 0.70 IU/mL  CBC    Result Value Ref Range   WBC 10.7 (H) 4.0 - 10.5 K/uL   RBC 3.99 (L) 4.22 - 5.81 MIL/uL   Hemoglobin 11.4 (L) 13.0 - 17.0 g/dL   HCT 40.9 (L) 81.1 - 91.4 %   MCV 86.7 78.0 - 100.0 fL   MCH 28.6 26.0 - 34.0 pg   MCHC 32.9 30.0 - 36.0 g/dL   RDW 78.2 (H) 95.6 - 21.3 %   Platelets 235 150 - 400 K/uL  Magnesium  Result Value Ref Range   Magnesium 2.3 1.7 - 2.4 mg/dL  Glucose, capillary  Result Value Ref Range   Glucose-Capillary 108 (H) 65 - 99 mg/dL   Comment 1 Notify RN   Renal function panel (daily at 1600)  Result Value Ref Range   Sodium 133 (L) 135 - 145 mmol/L   Potassium 4.1 3.5 - 5.1 mmol/L   Chloride 98 (L) 101 - 111 mmol/L   CO2 28 22 - 32 mmol/L   Glucose, Bld 140 (H) 65 - 99 mg/dL   BUN 8 6 - 20 mg/dL   Creatinine, Ser 4.09 (H) 0.61 - 1.24 mg/dL   Calcium 8.8 (L) 8.9 - 10.3 mg/dL   Phosphorus 1.9 (L) 2.5 - 4.6 mg/dL   Albumin 3.2 (L) 3.5 - 5.0 g/dL   GFR calc non Af Amer 54 (L) >60 mL/min   GFR calc Af Amer >60 >60 mL/min   Anion gap 7 5 - 15  APTT  Result Value Ref Range   aPTT >200 (HH) 24 - 37 seconds  Glucose, capillary  Result Value Ref Range   Glucose-Capillary 130 (H) 65 - 99 mg/dL  Heparin level (unfractionated)  Result Value Ref Range   Heparin Unfractionated 0.69 0.30 - 0.70 IU/mL  Glucose, capillary  Result Value Ref Range   Glucose-Capillary 132 (H) 65 - 99 mg/dL  Glucose, capillary  Result Value Ref Range   Glucose-Capillary 128 (H) 65 - 99 mg/dL   Comment 1 Capillary Specimen   Magnesium  Result Value Ref Range   Magnesium 2.3 1.7 - 2.4 mg/dL  Heparin level (unfractionated)  Result Value Ref Range   Heparin Unfractionated 0.56 0.30 - 0.70 IU/mL  CBC  Result Value Ref Range   WBC 12.5 (H) 4.0 - 10.5 K/uL   RBC 3.88 (L) 4.22 - 5.81 MIL/uL   Hemoglobin 10.8 (L) 13.0 - 17.0 g/dL   HCT 81.1 (L) 91.4 - 78.2 %   MCV 87.4 78.0 - 100.0 fL   MCH 27.8 26.0 - 34.0 pg   MCHC 31.9 30.0 - 36.0 g/dL   RDW 95.6 (H) 21.3 - 08.6 %   Platelets 237  150 - 400 K/uL  Uric acid  Result Value Ref Range   Uric Acid, Serum 1.2 (L) 4.4 - 7.6 mg/dL  Basic metabolic panel  Result Value Ref Range   Sodium 134 (L) 135 - 145 mmol/L   Potassium 4.1 3.5 - 5.1 mmol/L   Chloride 97 (L) 101 - 111 mmol/L   CO2 28 22 - 32 mmol/L   Glucose, Bld 119 (H) 65 - 99 mg/dL   BUN 7 6 - 20 mg/dL   Creatinine, Ser 5.78 (H) 0.61 - 1.24 mg/dL   Calcium 8.9 8.9 - 46.9 mg/dL   GFR calc non Af Amer 53 (L) >60 mL/min   GFR calc Af Amer >60 >60 mL/min   Anion gap 9 5 - 15  Phosphorus  Result Value Ref Range   Phosphorus 2.1 (L) 2.5 - 4.6 mg/dL  APTT  Result Value Ref Range   aPTT 140 (H) 24 - 37 seconds  Glucose, capillary  Result Value Ref Range   Glucose-Capillary 115 (H) 65 - 99 mg/dL   Comment 1 Capillary Specimen   APTT  Result Value Ref Range   aPTT 115 (H) 24 - 37 seconds  Renal function panel (daily at 1600)  Result Value Ref Range   Sodium 130 (L) 135 - 145 mmol/L   Potassium 3.6  3.5 - 5.1 mmol/L   Chloride 93 (L) 101 - 111 mmol/L   CO2 28 22 - 32 mmol/L   Glucose, Bld 118 (H) 65 - 99 mg/dL   BUN 13 6 - 20 mg/dL   Creatinine, Ser 1.61 (H) 0.61 - 1.24 mg/dL   Calcium 8.8 (L) 8.9 - 10.3 mg/dL   Phosphorus 4.2 2.5 - 4.6 mg/dL   Albumin 3.1 (L) 3.5 - 5.0 g/dL   GFR calc non Af Amer 38 (L) >60 mL/min   GFR calc Af Amer 44 (L) >60 mL/min   Anion gap 9 5 - 15  Glucose, capillary  Result Value Ref Range   Glucose-Capillary 107 (H) 65 - 99 mg/dL   Comment 1 Capillary Specimen    Comment 2 Notify RN   Heparin level (unfractionated)  Result Value Ref Range   Heparin Unfractionated <0.10 (L) 0.30 - 0.70 IU/mL  Glucose, capillary  Result Value Ref Range   Glucose-Capillary 150 (H) 65 - 99 mg/dL   Comment 1 Capillary Specimen    Comment 2 Notify RN   Glucose, capillary  Result Value Ref Range   Glucose-Capillary 108 (H) 65 - 99 mg/dL   Comment 1 Capillary Specimen    Comment 2 Notify RN   Magnesium  Result Value Ref Range   Magnesium 1.8  1.7 - 2.4 mg/dL  CBC  Result Value Ref Range   WBC 11.9 (H) 4.0 - 10.5 K/uL   RBC 3.76 (L) 4.22 - 5.81 MIL/uL   Hemoglobin 10.7 (L) 13.0 - 17.0 g/dL   HCT 09.6 (L) 04.5 - 40.9 %   MCV 86.7 78.0 - 100.0 fL   MCH 28.5 26.0 - 34.0 pg   MCHC 32.8 30.0 - 36.0 g/dL   RDW 81.1 (H) 91.4 - 78.2 %   Platelets 233 150 - 400 K/uL  Phosphorus  Result Value Ref Range   Phosphorus 3.5 2.5 - 4.6 mg/dL  Comprehensive metabolic panel  Result Value Ref Range   Sodium 133 (L) 135 - 145 mmol/L   Potassium 3.4 (L) 3.5 - 5.1 mmol/L   Chloride 93 (L) 101 - 111 mmol/L   CO2 28 22 - 32 mmol/L   Glucose, Bld 117 (H) 65 - 99 mg/dL   BUN 18 6 - 20 mg/dL   Creatinine, Ser 9.56 (H) 0.61 - 1.24 mg/dL   Calcium 9.5 8.9 - 21.3 mg/dL   Total Protein 6.9 6.5 - 8.1 g/dL   Albumin 3.1 (L) 3.5 - 5.0 g/dL   AST 27 15 - 41 U/L   ALT 16 (L) 17 - 63 U/L   Alkaline Phosphatase 63 38 - 126 U/L   Total Bilirubin 0.7 0.3 - 1.2 mg/dL   GFR calc non Af Amer 35 (L) >60 mL/min   GFR calc Af Amer 41 (L) >60 mL/min   Anion gap 12 5 - 15  Heparin level (unfractionated)  Result Value Ref Range   Heparin Unfractionated <0.10 (L) 0.30 - 0.70 IU/mL  Glucose, capillary  Result Value Ref Range   Glucose-Capillary 124 (H) 65 - 99 mg/dL   Comment 1 Capillary Specimen   Renal function panel (daily at 1600)  Result Value Ref Range   Sodium 129 (L) 135 - 145 mmol/L   Potassium 3.5 3.5 - 5.1 mmol/L   Chloride 90 (L) 101 - 111 mmol/L   CO2 27 22 - 32 mmol/L   Glucose, Bld 116 (H) 65 - 99 mg/dL   BUN 23 (H) 6 - 20  mg/dL   Creatinine, Ser 1.61 (H) 0.61 - 1.24 mg/dL   Calcium 9.2 8.9 - 09.6 mg/dL   Phosphorus 3.6 2.5 - 4.6 mg/dL   Albumin 3.3 (L) 3.5 - 5.0 g/dL   GFR calc non Af Amer 31 (L) >60 mL/min   GFR calc Af Amer 36 (L) >60 mL/min   Anion gap 12 5 - 15  Glucose, capillary  Result Value Ref Range   Glucose-Capillary 106 (H) 65 - 99 mg/dL   Comment 1 Notify RN   Glucose, capillary  Result Value Ref Range    Glucose-Capillary 132 (H) 65 - 99 mg/dL  Heparin level (unfractionated)  Result Value Ref Range   Heparin Unfractionated 0.15 (L) 0.30 - 0.70 IU/mL  Heparin level (unfractionated)  Result Value Ref Range   Heparin Unfractionated 0.30 0.30 - 0.70 IU/mL  Magnesium  Result Value Ref Range   Magnesium 1.9 1.7 - 2.4 mg/dL  Glucose, capillary  Result Value Ref Range   Glucose-Capillary 123 (H) 65 - 99 mg/dL  Glucose, capillary  Result Value Ref Range   Glucose-Capillary 124 (H) 65 - 99 mg/dL  Renal function panel (daily at 1600)  Result Value Ref Range   Sodium 132 (L) 135 - 145 mmol/L   Potassium 3.4 (L) 3.5 - 5.1 mmol/L   Chloride 92 (L) 101 - 111 mmol/L   CO2 28 22 - 32 mmol/L   Glucose, Bld 123 (H) 65 - 99 mg/dL   BUN 33 (H) 6 - 20 mg/dL   Creatinine, Ser 0.45 (H) 0.61 - 1.24 mg/dL   Calcium 9.5 8.9 - 40.9 mg/dL   Phosphorus 3.9 2.5 - 4.6 mg/dL   Albumin 3.0 (L) 3.5 - 5.0 g/dL   GFR calc non Af Amer 30 (L) >60 mL/min   GFR calc Af Amer 35 (L) >60 mL/min   Anion gap 12 5 - 15  Heparin level (unfractionated)  Result Value Ref Range   Heparin Unfractionated 0.23 (L) 0.30 - 0.70 IU/mL  Hemoglobin A1c  Result Value Ref Range   Hgb A1c MFr Bld 5.9 (H) 4.8 - 5.6 %   Mean Plasma Glucose 123 mg/dL  Glucose, capillary  Result Value Ref Range   Glucose-Capillary 105 (H) 65 - 99 mg/dL  Basic metabolic panel  Result Value Ref Range   Sodium 129 (L) 135 - 145 mmol/L   Potassium 3.3 (L) 3.5 - 5.1 mmol/L   Chloride 89 (L) 101 - 111 mmol/L   CO2 27 22 - 32 mmol/L   Glucose, Bld 109 (H) 65 - 99 mg/dL   BUN 31 (H) 6 - 20 mg/dL   Creatinine, Ser 8.11 (H) 0.61 - 1.24 mg/dL   Calcium 9.4 8.9 - 91.4 mg/dL   GFR calc non Af Amer 28 (L) >60 mL/min   GFR calc Af Amer 33 (L) >60 mL/min   Anion gap 13 5 - 15  CBC  Result Value Ref Range   WBC 11.2 (H) 4.0 - 10.5 K/uL   RBC 4.09 (L) 4.22 - 5.81 MIL/uL   Hemoglobin 11.6 (L) 13.0 - 17.0 g/dL   HCT 78.2 (L) 95.6 - 21.3 %   MCV 85.1 78.0 -  100.0 fL   MCH 28.4 26.0 - 34.0 pg   MCHC 33.3 30.0 - 36.0 g/dL   RDW 08.6 (H) 57.8 - 46.9 %   Platelets 275 150 - 400 K/uL  Heparin level (unfractionated)  Result Value Ref Range   Heparin Unfractionated 0.21 (L) 0.30 - 0.70 IU/mL  Glucose, capillary  Result Value Ref Range   Glucose-Capillary 106 (H) 65 - 99 mg/dL  Glucose, capillary  Result Value Ref Range   Glucose-Capillary 118 (H) 65 - 99 mg/dL  CBC  Result Value Ref Range   WBC 9.8 4.0 - 10.5 K/uL   RBC 4.20 (L) 4.22 - 5.81 MIL/uL   Hemoglobin 11.9 (L) 13.0 - 17.0 g/dL   HCT 60.4 (L) 54.0 - 98.1 %   MCV 84.5 78.0 - 100.0 fL   MCH 28.3 26.0 - 34.0 pg   MCHC 33.5 30.0 - 36.0 g/dL   RDW 19.1 (H) 47.8 - 29.5 %   Platelets 240 150 - 400 K/uL  Basic metabolic panel  Result Value Ref Range   Sodium 129 (L) 135 - 145 mmol/L   Potassium 3.9 3.5 - 5.1 mmol/L   Chloride 91 (L) 101 - 111 mmol/L   CO2 26 22 - 32 mmol/L   Glucose, Bld 112 (H) 65 - 99 mg/dL   BUN 36 (H) 6 - 20 mg/dL   Creatinine, Ser 6.21 (H) 0.61 - 1.24 mg/dL   Calcium 9.7 8.9 - 30.8 mg/dL   GFR calc non Af Amer 28 (L) >60 mL/min   GFR calc Af Amer 33 (L) >60 mL/min   Anion gap 12 5 - 15  Heparin level (unfractionated)  Result Value Ref Range   Heparin Unfractionated 0.54 0.30 - 0.70 IU/mL  Glucose, capillary  Result Value Ref Range   Glucose-Capillary 115 (H) 65 - 99 mg/dL  Glucose, capillary  Result Value Ref Range   Glucose-Capillary 162 (H) 65 - 99 mg/dL  Glucose, capillary  Result Value Ref Range   Glucose-Capillary 93 65 - 99 mg/dL   Comment 1 Notify RN   Glucose, capillary  Result Value Ref Range   Glucose-Capillary 143 (H) 65 - 99 mg/dL   Comment 1 Notify RN   I-stat troponin, ED  Result Value Ref Range   Troponin i, poc 0.04 0.00 - 0.08 ng/mL   Comment 3          POCT Activated clotting time  Result Value Ref Range   Activated Clotting Time 140 seconds  POCT Activated clotting time  Result Value Ref Range   Activated Clotting Time 190  seconds  POCT Activated clotting time  Result Value Ref Range   Activated Clotting Time 171 seconds  POCT Activated clotting time  Result Value Ref Range   Activated Clotting Time 177 seconds  POCT Activated clotting time  Result Value Ref Range   Activated Clotting Time 190 seconds  POCT Activated clotting time  Result Value Ref Range   Activated Clotting Time 196 seconds  POCT Activated clotting time  Result Value Ref Range   Activated Clotting Time 202 seconds  POCT Activated clotting time  Result Value Ref Range   Activated Clotting Time 196 seconds  POCT Activated clotting time  Result Value Ref Range   Activated Clotting Time 190 seconds  POCT Activated clotting time  Result Value Ref Range   Activated Clotting Time 190 seconds  POCT Activated clotting time  Result Value Ref Range   Activated Clotting Time 177 seconds  POCT Activated clotting time  Result Value Ref Range   Activated Clotting Time 183 seconds  POCT Activated clotting time  Result Value Ref Range   Activated Clotting Time 190 seconds  POCT Activated clotting time  Result Value Ref Range   Activated Clotting Time 190 seconds  POCT Activated clotting time  Result Value Ref Range   Activated Clotting Time 190 seconds  POCT Activated clotting time  Result Value Ref Range   Activated Clotting Time 190 seconds  POCT Activated clotting time  Result Value Ref Range   Activated Clotting Time 189 seconds  POCT Activated clotting time  Result Value Ref Range   Activated Clotting Time 183 seconds  POCT Activated clotting time  Result Value Ref Range   Activated Clotting Time 177 seconds  POCT Activated clotting time  Result Value Ref Range   Activated Clotting Time 183 seconds  POCT Activated clotting time  Result Value Ref Range   Activated Clotting Time 183 seconds  POCT Activated clotting time  Result Value Ref Range   Activated Clotting Time 190 seconds  POCT Activated clotting time  Result  Value Ref Range   Activated Clotting Time 183 seconds  POCT Activated clotting time  Result Value Ref Range   Activated Clotting Time 190 seconds  POCT Activated clotting time  Result Value Ref Range   Activated Clotting Time 196 seconds  POCT Activated clotting time  Result Value Ref Range   Activated Clotting Time 196 seconds  POCT Activated clotting time  Result Value Ref Range   Activated Clotting Time 190 seconds  POCT Activated clotting time  Result Value Ref Range   Activated Clotting Time 171 seconds  POCT Activated clotting time  Result Value Ref Range   Activated Clotting Time 171 seconds  POCT Activated clotting time  Result Value Ref Range   Activated Clotting Time 177 seconds  POCT Activated clotting time  Result Value Ref Range   Activated Clotting Time 165 seconds  POCT Activated clotting time  Result Value Ref Range   Activated Clotting Time 183 seconds  POCT Activated clotting time  Result Value Ref Range   Activated Clotting Time 171 seconds  POCT Activated clotting time  Result Value Ref Range   Activated Clotting Time 196 seconds  POCT Activated clotting time  Result Value Ref Range   Activated Clotting Time 196 seconds  POCT Activated clotting time  Result Value Ref Range   Activated Clotting Time 202 seconds  POCT Activated clotting time  Result Value Ref Range   Activated Clotting Time 214 seconds  POCT Activated clotting time  Result Value Ref Range   Activated Clotting Time 220 seconds  POCT Activated clotting time  Result Value Ref Range   Activated Clotting Time 220 seconds  POCT Activated clotting time  Result Value Ref Range   Activated Clotting Time 214 seconds  POCT Activated clotting time  Result Value Ref Range   Activated Clotting Time 202 seconds  POCT Activated clotting time  Result Value Ref Range   Activated Clotting Time 202 seconds  POCT Activated clotting time  Result Value Ref Range   Activated Clotting Time 190  seconds  POCT Activated clotting time  Result Value Ref Range   Activated Clotting Time 190 seconds     EKG/XRAY:   Primary read interpreted by Dr. Conley Rolls at [facility].   ASSESSMENT/PLAN: Encounter Diagnoses  Name Primary?  . Chronic pain syndrome   . Acute on chronic congestive heart failure, unspecified congestive heart failure type (HCC)   . OSA (obstructive sleep apnea)   . Medication monitoring encounter Yes  . Type 2 diabetes mellitus with stage 3 chronic kidney disease, without long-term current use of insulin [facility])    Hospital dc f/u for acut eon chronic CHF, needs repeat CMP for hyponatremia, ARF He  is doing well, does not need any meds except narcotics today, he will call if he needs refills Refer to Sleep study He has appt with nephrology and cardiology already set up Advise to monitor weight at home Refilled  Pain meds, #60, then will pick up on 09/18/2015. Will check on pain management status, patient has been referred several times [place] controlled substance DB pulled, no illegal activities.  Fu prn   Gross sideeffects, risk and benefits, and alternatives of medications d/w patient. Patient is aware that all medications have potential sideeffects and we are unable to predict every sideeffect or drug-drug interaction that may occur.  Thao Le DO  09/04/2015 10:44 AM

## 2015-09-05 ENCOUNTER — Other Ambulatory Visit: Payer: Self-pay | Admitting: *Deleted

## 2015-09-05 NOTE — Patient Outreach (Signed)
[facility]) Care Management  09/05/2015  DRUE HUIZINGA May 01, 1951 [id number]   Initial transition of care outreach  RN attempted outreach call today however unsuccessful as pt's voice mailbox indicates pt's mailbox is full. Will continue outreach attempts for [facility] services.  [name], RN Care Management Coordinator [facility] Main Office [phone]

## 2015-09-08 ENCOUNTER — Encounter (HOSPITAL_COMMUNITY): Payer: Commercial Managed Care - HMO

## 2015-09-08 ENCOUNTER — Telehealth: Payer: Self-pay | Admitting: Family Medicine

## 2015-09-08 ENCOUNTER — Other Ambulatory Visit: Payer: Self-pay | Admitting: *Deleted

## 2015-09-08 NOTE — Patient Outreach (Signed)
[facility]) Care Management  09/08/2015  Frank Davidson 10/29/1951 [id number]  Transition of care (week 2)  RN spoke with pt concerning [facility] services and the purpose of today's call. RN expressed the available services and program reintroducing the HF program to this pt (history with [facility] for HF program). Pt states it's not a lack of knowledge and he remains educated on HF zones. States he had fluid retention and continued to gain weight with changes made to his medications. Pt states [facility] is currently involved and visited today. States his weight was 214 lbs yesterday and 218 lbs today. States the nurse has outreached to the providers office he is not in distress at this time. No changes or interventions have occur and pt denies any swelling or distress breathing with no presented symptoms other then the 4 lbs overnight. Pt has a follow up appointment with Dr. Suella Broad on 10/20 and his primary on 10/19.  RN strongly encouraged community visits with this RN case manager to better assist as a prevention measures with managing his HF as pt opt to decline home visits with [facility] but very receptive to the ongoing weekly transition of care calls over the next few weeks. RN will continue to be available to assist and again extend the invitation for community home visits over the next few weeks. No other request or inquires at this time. Will comply with telephonic follow ups.  [name], RN Care Management Coordinator [facility] Main Office [phone]

## 2015-09-08 NOTE — Telephone Encounter (Signed)
LM to call me back about labs.

## 2015-09-08 NOTE — Telephone Encounter (Signed)
Reported by [facility] that patients weight was 213 and went up to 218 lbs.  Took a metolazone today

## 2015-09-09 ENCOUNTER — Telehealth: Payer: Self-pay

## 2015-09-09 NOTE — Telephone Encounter (Signed)
Frank Davidson, PT w/[facility], called asking for orders for twice weekly PT home visits x 4 wks. Working w/pt mostly for Dx of activity intolerance, and SOB. Dr Conley Rolls, I wasn't sure if you would be willing to sign orders for pt since you are not listed as PCP, but it doesn't look like he has seen Dr Patsy Lager recently, and you evaluated pt after hosp stay. Please advise. RN has also evaluated pt and will be asking for orders also. Kendra's # is [phone], and orders can be left on her secure VM.

## 2015-09-09 NOTE — Telephone Encounter (Signed)
Tried to call pt again. I left an SMS message, when given the option by VM after saying the VM is full. I believe the SMS message lets the pt know that someone from this number had tried to call. Dr Conley Rolls, [place]. Did you want me to ask Referrals to get him in to Dr Milas Kocher?

## 2015-09-09 NOTE — Telephone Encounter (Signed)
I have called Frank Davidson and approved the PT orders.  I tried calling him again to let him know his kidney function is trending up but no answer and voicemail is full, his weight per Frank Davidson has increased by 5 lbs in 24 hrs and he is taking more of the diuretic. Can you call him again to see what the status of his kidney doctor and cardiology appt are and we need to probably get him in to see Dr Milas Kocher if he is swelling up that much.   Thanks!!

## 2015-09-09 NOTE — Telephone Encounter (Signed)
Tried to call pt back after Dr Conley Rolls contacted me, in order to give pt Dr Irwin Brakeman message and advise to go to ER if he is not seeing his cardiologist or nephrologist today and has SOB and gained that much weight overnight. No one picked up call and VM was full.

## 2015-09-09 NOTE — Telephone Encounter (Signed)
Let's f/u in am and see if he had good response.

## 2015-09-10 ENCOUNTER — Ambulatory Visit (INDEPENDENT_AMBULATORY_CARE_PROVIDER_SITE_OTHER): Payer: Commercial Managed Care - HMO | Admitting: Family Medicine

## 2015-09-10 ENCOUNTER — Encounter: Payer: Self-pay | Admitting: Family Medicine

## 2015-09-10 VITALS — BP 124/66 | HR 80 | Temp 98.0°F | Resp 16 | Ht 66.0 in | Wt 225.2 lb

## 2015-09-10 DIAGNOSIS — I5022 Chronic systolic (congestive) heart failure: Secondary | ICD-10-CM | POA: Diagnosis not present

## 2015-09-10 DIAGNOSIS — N189 Chronic kidney disease, unspecified: Secondary | ICD-10-CM

## 2015-09-10 DIAGNOSIS — R635 Abnormal weight gain: Secondary | ICD-10-CM

## 2015-09-10 DIAGNOSIS — N179 Acute kidney failure, unspecified: Secondary | ICD-10-CM

## 2015-09-10 NOTE — Telephone Encounter (Signed)
No he just needs to call us and so we can see what his status is before we go further.  Thanks

## 2015-09-10 NOTE — Progress Notes (Addendum)
[facility] [address] [place] [address] [phone]  Date:  09/10/2015   Name:  Frank Davidson   DOB:  August 14, 1951   MRN:  [id number]  PCP:  Abbe Amsterdam, MD    Chief Complaint: Follow-up; Hospital vs; and feeling tired   History of Present Illness:  Frank Davidson is a 64 y.o. very pleasant male patient who presents with the following:  He was recently in the hospital again with his CHF from 10/5 to 10/12.  EF 25-30%. He notes that he has gained weight again since he was discharged a week ago.  When he was admitted recently he was up over 240lbs- today he is back to 225 which he considers to be his "dry weight."  However he has gained 17 since discharge on 10/12 as below.  He states that he was doing well but started gaining a lot over the last 2-3 days He also had worsening of his chronic renal failure with creat max of 7 while inpt.  DOD his creat was 2.3, rechecked the day after discharge and he was up to 2.57, GFR of 29  Admits that he is feeling tired.  However not having much in the way of LE edema, he is able to lie flat ok.   His usual diuretic regimen consists of torsemide , 2 twice a day He took 1 metalozone yesterday and 2 today in an attempt to get rid of fluid  Wt Readings from Last 3 Encounters:  09/10/15 225 lb 3.2 oz (102.15 kg)  09/04/15 212 lb (96.163 kg)  09/03/15 208 lb 8 oz (94.575 kg)    Patient Active Problem List   Diagnosis Date Noted  . Acute on chronic systolic congestive heart failure (HCC)   . Type 2 diabetes mellitus with stage 4 chronic kidney disease, without long-term current use of insulin (HCC)   . Nausea & vomiting   . Acute on chronic systolic CHF (congestive heart failure) (HCC)   . AKI (acute kidney injury) (HCC)   . Acute kidney injury (HCC) 08/27/2015  . Arterial hypotension   . Hip pain 07/23/2015  . Acute exacerbation of CHF (congestive heart failure) (HCC)   . Dyspnea   . Acute renal  failure superimposed on stage 3 chronic kidney disease (HCC) 07/01/2015  . Herpes ocular   . Acute on chronic systolic heart failure (HCC) 06/19/2015  . Facial rash 06/19/2015  . Diabetes mellitus type 2, controlled (HCC) 06/19/2015  . Essential hypertension 06/19/2015  . Chronic anemia 06/19/2015  . CHF (congestive heart failure) (HCC) 06/19/2015  . CHF exacerbation (HCC) 06/18/2015  . Non-ischemic cardiomyopathy- EF 15% echo Aug 2015 11/24/2014  . Chronic anticoagulation 11/24/2014  . S/P left THA, AA 09/12/2014  . Gout 04/29/2014  . Ocular herpes   . Neuropathy (HCC)   . OSA (obstructive sleep apnea) 07/09/2013  . Chronic systolic heart failure (HCC) 07/05/2013  . 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 PAF (paroxysmal atrial fibrillation) (HCC) 07/03/2013  . Hypertension   . COPD (chronic obstructive pulmonary disease) (HCC)   . Diabetes mellitus with nephropathy   . CKD (chronic kidney disease), stage III   . Polysubstance abuse     Past Medical History  Diagnosis Date  . Atrial fibrillation (HCC)     PAF  . Hypertension   . COPD (chronic obstructive pulmonary disease) (HCC)   . Ocular herpes   . Gout   . CHF (congestive heart failure) (  HCC)   . Chronic pain   . Hyperlipidemia   . Nonischemic cardiomyopathy (HCC) Aug 2015    EF 15%  . Ocular herpes   . Neuropathy (HCC)   . Left-sided weakness     "because of the arthritis and edema"  . PVD (peripheral vascular disease) [facility]) May 2015    abnormal ABIs  . Memory impairment     "short term"  . CKD (chronic kidney disease), stage III     Dr. Briant Cedar follows-holding   . Fibromyalgia     neuropathy- hands, more than feet.  . Sleep apnea with use of continuous positive airway pressure (CPAP)     does not use cpap  . GERD (gastroesophageal reflux disease)     tums as needed  . Asthma   . Type II diabetes mellitus (HCC)   . Stroke [facility]) X 5    last stroke aug 2014"; denies residual on 09/12/2014  .  Osteoarthritis   . Arthritis     "knees, right elbow, shoulder" (09/12/2014)  . Shortness of breath dyspnea     Past Surgical History  Procedure Laterality Date  . 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 Tonsillectomy  age 9  . Esophagogastroduodenoscopy N/A 12/24/2013    Procedure: ESOPHAGOGASTRODUODENOSCOPY (EGD);  Surgeon: Louis Meckel, MD;  Location: [facility];  Service: Endoscopy;  Laterality: N/A;  . Colonoscopy N/A 12/24/2013    Procedure: COLONOSCOPY;  Surgeon: Louis Meckel, MD;  Location: [facility] ENDOSCOPY;  Service: Endoscopy;  Laterality: N/A;  . Joint replacement    . Cardiac catheterization  10/2006    normal coronary arteries;   Marland Kitchen Cataract extraction w/ intraocular lens  implant, bilateral Bilateral ~ 2008  . Total hip arthroplasty Left 09/12/2014    Procedure: LEFT TOTAL HIP ARTHROPLASTY ANTERIOR APPROACH;  Surgeon: [name], MD;  Location: [facility] OR;  Service: Orthopedics;  Laterality: Left;  . Right heart catheterization N/A 08/13/2014    Rt ht cath prior to hip surgery    Social History  Substance Use Topics  . Smoking status: Former Smoker -- 0.00 packs/day for 0 years    Types: Cigarettes    Quit date: 12/18/2006  . Smokeless tobacco: Never Used  . Alcohol Use: No     Comment: Quit 2008    Family History  Problem Relation Age of Onset  . Cancer Mother   . Hypertension Mother   . Lung disease Mother   . Diabetes Father   . Diabetes Sister   . Hypertension Sister   . Hypertension Brother   . Hypertension Sister   . Colon cancer Neg Hx     Allergies  Allergen Reactions  . Corlanor [Ivabradine] Palpitations and Other (See Comments)    Kidney and heart problems, syncope  . Entresto [Sacubitril-Valsartan]     Syncope  . Januvia [Sitagliptin] Other (See Comments)    "Almost killed me"   . Lunesta [Eszopiclone] Palpitations and Other (See Comments)    Kidney and heart problems, syncope  . Actos [Pioglitazone] Other (See Comments)    Caused blood in  urine and fluid retention     Medication list has been reviewed and updated.  Current Outpatient Prescriptions on File Prior to Visit  Medication Sig Dispense Refill  . albuterol (PROVENTIL HFA;VENTOLIN HFA) 108 (90 BASE) MCG/ACT inhaler Inhale 2 puffs into the lungs every 6 (six) hours as needed for wheezing or shortness of breath. 1 Inhaler 1  . allopurinol (ZYLOPRIM) 100  MG tablet Take 100 mg by mouth daily.    Marland Kitchen apixaban (ELIQUIS) 5 MG TABS tablet Take 1 tablet (5 mg total) by mouth 2 (two) times daily. 180 tablet 3  . baclofen (LIORESAL) 10 MG tablet TAKE 1 TABLET EVERY 8 HOURS AS NEEDED FOR MUSCLE SPASM(S) 90 tablet 1  . carvedilol (COREG) 12.5 MG tablet Take 1 tablet (12.5 mg total) by mouth 2 (two) times daily with a meal. 60 tablet 0  . docusate sodium 100 MG CAPS Take 100 mg by mouth 2 (two) times daily. 10 capsule 0  . omega-3 acid ethyl esters (LOVAZA) 1 G capsule Take 1 g by mouth daily.    Marland Kitchen oxyCODONE-acetaminophen (PERCOCET) 10-325 MG tablet Take 1 tablet by mouth 4 (four) times daily. Scheduled. Use with stool softerner. Refill on 09/18/2015 60 tablet 0  . polyethylene glycol powder (GLYCOLAX/MIRALAX) powder MIX 1 CAPFUL (17GM)  IN  LIQUID  AND  DRINK EVERY DAY AS DIRECTED 1530 g 3  . potassium chloride SA (K-DUR,KLOR-CON) 20 MEQ tablet Take 1 tablet (20 mEq total) by mouth daily. 30 tablet 6  . torsemide (DEMADEX) 20 MG tablet TAKE 2 TABLETS BY MOUTH 2 TIMES DAILY 120 tablet 4  . diclofenac (FLECTOR) 1.3 % PTCH Place 1 patch onto the skin 2 (two) times daily as needed (pain).    . prednisoLONE acetate (PRED FORTE) 1 % ophthalmic suspension Place 1 drop into both eyes 4 (four) times daily.     No current facility-administered medications on file prior to visit.    Review of Systems:  As per HPI- otherwise negative.   Physical Examination: Filed Vitals:   09/10/15 1614  BP: 124/66  Pulse: 80  Temp: 98 F (36.7 C)  Resp: 16   Filed Vitals:   09/10/15 1614   Height: 5\' 6"  (1.676 m)  Weight: 225 lb 3.2 oz (102.15 kg)   Body mass index is 36.37 kg/(m^2). Ideal Body Weight: Weight in (lb) to have BMI = 25: 154.6  GEN: WDWN, NAD, Non-toxic, A & O x 3, obese, looks well HEENT: Atraumatic, Normocephalic. Neck supple. No masses, No LAD. Ears and Nose: No external deformity. CV: RRR, No M/G/R. No JVD. No thrill. No extra heart sounds. PULM: CTA B, no wheezes, crackles, rhonchi. No retractions. No resp. distress. No accessory muscle use. ABD: S, NT, ND EXTR: No c/c. NEURO Normal gait for pt, using a cane as usual PSYCH: Normally interactive. Conversant. Not depressed or anxious appearing.  Calm demeanor.  Minimal LE edema  Assessment and Plan: Chronic systolic heart failure (HCC)  Acute on chronic renal failure (HCC) - Plan: Basic metabolic panel  Weight gain  Faron unfortunately has severe CHF and has been hospitalized a few times this year.  He was discharged a week ago and weight is now up 17 lbs since discharge.  Called and spoke with Dr. Gala Romney about his care- we will increase torsemide to 60 BID, continue metolazone one daily.  His case is complicated by CRF- checked BMP today.  Discussed with pt- if creat over 3 he will need admission again. He states understanding and will expect my call tomorrow.  If creat not worsening will continue to follow closely and he will see CHF clinic next week Signed Abbe Amsterdam, MD

## 2015-09-10 NOTE — Patient Instructions (Signed)
We are going to check your kidney function- if it is getting worse I'm afraid you will need to be admitted again For the time being take metolazone ONE daily Increase your torsemide to 3 pills (60mg ) twice a day

## 2015-09-11 ENCOUNTER — Telehealth: Payer: Self-pay

## 2015-09-11 LAB — BASIC METABOLIC PANEL
BUN: 78 mg/dL — ABNORMAL HIGH (ref 7–25)
CALCIUM: 9.2 mg/dL (ref 8.6–10.3)
CO2: 28 mmol/L (ref 20–31)
CREATININE: 2.61 mg/dL — AB (ref 0.70–1.25)
Chloride: 96 mmol/L — ABNORMAL LOW (ref 98–110)
GLUCOSE: 158 mg/dL — AB (ref 65–99)
Potassium: 4.2 mmol/L (ref 3.5–5.3)
Sodium: 132 mmol/L — ABNORMAL LOW (ref 135–146)

## 2015-09-11 NOTE — Telephone Encounter (Signed)
This was not processed last night in time for courier pick up. Dr. Patsy Lager notified. Changed order to STAT per her.

## 2015-09-11 NOTE — Telephone Encounter (Signed)
-----   Message from Pearline Cables, MD sent at 09/11/2015  9:07 AM EDT ----- Hi guys- I ordered a BMP for this pt yesterday but it is not back yet which is strange.  Can we please check on this lab asap- it is important.  Thanks!

## 2015-09-11 NOTE — Telephone Encounter (Signed)
Received BMP and called pt- his creatinine is not any worse.  He has lost about 3 lbs.  He will come by and see me quickly tomorrow for weight check and to follow-up on renal function  Results for orders placed or performed in visit on 09/10/15  Basic metabolic panel  Result Value Ref Range   Sodium 132 (L) 135 - 146 mmol/L   Potassium 4.2 3.5 - 5.3 mmol/L   Chloride 96 (L) 98 - 110 mmol/L   CO2 28 20 - 31 mmol/L   Glucose, Bld 158 (H) 65 - 99 mg/dL   BUN 78 (H) 7 - 25 mg/dL   Creat 2.22 (H) 9.79 - 1.25 mg/dL   Calcium 9.2 8.6 - 89.2 mg/dL

## 2015-09-11 NOTE — Addendum Note (Signed)
Addended by: Johnnette Litter on: 09/11/2015 10:50 AM   Modules accepted: Orders

## 2015-09-12 ENCOUNTER — Ambulatory Visit: Payer: Commercial Managed Care - HMO

## 2015-09-12 ENCOUNTER — Ambulatory Visit (INDEPENDENT_AMBULATORY_CARE_PROVIDER_SITE_OTHER): Payer: Commercial Managed Care - HMO | Admitting: Family Medicine

## 2015-09-12 VITALS — BP 105/60 | HR 98 | Temp 98.5°F | Resp 14 | Ht 66.0 in | Wt 229.2 lb

## 2015-09-12 DIAGNOSIS — N184 Chronic kidney disease, stage 4 (severe): Secondary | ICD-10-CM | POA: Diagnosis not present

## 2015-09-12 DIAGNOSIS — I5022 Chronic systolic (congestive) heart failure: Secondary | ICD-10-CM

## 2015-09-12 NOTE — Patient Instructions (Signed)
Good to see you today- I will be in touch with your labs tomorrow Please keep daily weights. If you are feeling more short of breath call me or to go the ER It seems to be that your heart failure is overall getting worse recently Please discuss the more long- term plan with your cardiologist and your family when you are back to baseline

## 2015-09-12 NOTE — Progress Notes (Addendum)
[facility], [place] [address] [phone]  Date:  09/12/2015   Name:  Frank Davidson   DOB:  1951-05-22   MRN:  [id number]  PCP:  Abbe Amsterdam, MD    Chief Complaint: Follow-up   History of Present Illness:  Frank Davidson is a 64 y.o. very pleasant male patient who presents with the following:  Here today to recheck CHF and acute on chronic renal failure.  See OV from 2 days ago He weighed 223 this am at home- this is a good weight for him.  However I am concerned that his weight continues to go up by our scale.  Before his last admission he weighed 245 lbs, was discharged at 208 lbs We also want to follow-up on his renal function which is another concern Danney reports that he feels about the same, perhaps a bit more SOB.   He does not feel that his belly is distended He is sleeping on 2 pillows and normally uses just 1  Here today with his niece   Wt Readings from Last 3 Encounters:  09/12/15 229 lb 3.2 oz (103.964 kg)  09/10/15 225 lb 3.2 oz (102.15 kg)  09/04/15 212 lb (96.163 kg)    BP Readings from Last 3 Encounters:  09/12/15 104/58  09/10/15 124/66  09/04/15 136/90    Patient Active Problem List   Diagnosis Date Noted  . Acute on chronic systolic congestive heart failure (HCC)   . Type 2 diabetes mellitus with stage 4 chronic kidney disease, without long-term current use of insulin (HCC)   . Nausea & vomiting   . Acute on chronic systolic CHF (congestive heart failure) (HCC)   . AKI (acute kidney injury) (HCC)   . Acute kidney injury (HCC) 08/27/2015  . Arterial hypotension   . Hip pain 07/23/2015  . Acute exacerbation of CHF (congestive heart failure) (HCC)   . Dyspnea   . Acute renal failure superimposed on stage 3 chronic kidney disease (HCC) 07/01/2015  . Herpes ocular   . Acute on chronic systolic heart failure (HCC) 06/19/2015  . Facial rash 06/19/2015  . Diabetes mellitus type 2, controlled (HCC)  06/19/2015  . Essential hypertension 06/19/2015  . Chronic anemia 06/19/2015  . CHF (congestive heart failure) (HCC) 06/19/2015  . CHF exacerbation (HCC) 06/18/2015  . Non-ischemic cardiomyopathy- EF 15% echo Aug 2015 11/24/2014  . Chronic anticoagulation 11/24/2014  . S/P left THA, AA 09/12/2014  . Gout 04/29/2014  . Ocular herpes   . Neuropathy (HCC)   . OSA (obstructive sleep apnea) 07/09/2013  . Chronic systolic heart failure (HCC) 07/05/2013  . 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 PAF (paroxysmal atrial fibrillation) (HCC) 07/03/2013  . Hypertension   . COPD (chronic obstructive pulmonary disease) (HCC)   . Diabetes mellitus with nephropathy   . CKD (chronic kidney disease), stage III   . Polysubstance abuse     Past Medical History  Diagnosis Date  . Atrial fibrillation (HCC)     PAF  . Hypertension   . COPD (chronic obstructive pulmonary disease) (HCC)   . Ocular herpes   . Gout   . CHF (congestive heart failure) (HCC)   . Chronic pain   . Hyperlipidemia   . Nonischemic cardiomyopathy (HCC) Aug 2015    EF 15%  . Ocular herpes   . Neuropathy (HCC)   . Left-sided weakness     "because of the arthritis and edema"  .  PVD (peripheral vascular disease) [facility]) May 2015    abnormal ABIs  . Memory impairment     "short term"  . CKD (chronic kidney disease), stage III     Dr. Briant Cedar follows-holding   . Fibromyalgia     neuropathy- hands, more than feet.  . Sleep apnea with use of continuous positive airway pressure (CPAP)     does not use cpap  . GERD (gastroesophageal reflux disease)     tums as needed  . Asthma   . Type II diabetes mellitus (HCC)   . Stroke [facility]) X 5    last stroke aug 2014"; denies residual on 09/12/2014  . Osteoarthritis   . Arthritis     "knees, right elbow, shoulder" (09/12/2014)  . Shortness of breath dyspnea     Past Surgical History  Procedure Laterality Date  . 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 Tonsillectomy  age 14  .  Esophagogastroduodenoscopy N/A 12/24/2013    Procedure: ESOPHAGOGASTRODUODENOSCOPY (EGD);  Surgeon: Louis Meckel, MD;  Location: [facility];  Service: Endoscopy;  Laterality: N/A;  . Colonoscopy N/A 12/24/2013    Procedure: COLONOSCOPY;  Surgeon: Louis Meckel, MD;  Location: [facility];  Service: Endoscopy;  Laterality: N/A;  . Joint replacement    . Cardiac catheterization  10/2006    normal coronary arteries;   Marland Kitchen Cataract extraction w/ intraocular lens  implant, bilateral Bilateral ~ 2008  . Total hip arthroplasty Left 09/12/2014    Procedure: LEFT TOTAL HIP ARTHROPLASTY ANTERIOR APPROACH;  Surgeon: [name], MD;  Location: [facility] OR;  Service: Orthopedics;  Laterality: Left;  . Right heart catheterization N/A 08/13/2014    Rt ht cath prior to hip surgery    Social History  Substance Use Topics  . Smoking status: Former Smoker -- 0.00 packs/day for 0 years    Types: Cigarettes    Quit date: 12/18/2006  . Smokeless tobacco: Never Used  . Alcohol Use: No     Comment: Quit 2008    Family History  Problem Relation Age of Onset  . Cancer Mother   . Hypertension Mother   . Lung disease Mother   . Diabetes Father   . Diabetes Sister   . Hypertension Sister   . Hypertension Brother   . Hypertension Sister   . Colon cancer Neg Hx     Allergies  Allergen Reactions  . Corlanor [Ivabradine] Palpitations and Other (See Comments)    Kidney and heart problems, syncope  . Entresto [Sacubitril-Valsartan]     Syncope  . Januvia [Sitagliptin] Other (See Comments)    "Almost killed me"   . Lunesta [Eszopiclone] Palpitations and Other (See Comments)    Kidney and heart problems, syncope  . Actos [Pioglitazone] Other (See Comments)    Caused blood in urine and fluid retention     Medication list has been reviewed and updated.  Current Outpatient Prescriptions on File Prior to Visit  Medication Sig Dispense Refill  . albuterol (PROVENTIL HFA;VENTOLIN HFA) 108 (90 BASE)  MCG/ACT inhaler Inhale 2 puffs into the lungs every 6 (six) hours as needed for wheezing or shortness of breath. 1 Inhaler 1  . allopurinol (ZYLOPRIM) 100 MG tablet Take 100 mg by mouth daily.    Marland Kitchen apixaban (ELIQUIS) 5 MG TABS tablet Take 1 tablet (5 mg total) by mouth 2 (two) times daily. 180 tablet 3  . baclofen (LIORESAL) 10 MG tablet TAKE 1 TABLET EVERY 8 HOURS AS NEEDED FOR  MUSCLE SPASM(S) 90 tablet 1  . carvedilol (COREG) 12.5 MG tablet Take 1 tablet (12.5 mg total) by mouth 2 (two) times daily with a meal. 60 tablet 0  . diclofenac (FLECTOR) 1.3 % PTCH Place 1 patch onto the skin 2 (two) times daily as needed (pain).    Marland Kitchen docusate sodium 100 MG CAPS Take 100 mg by mouth 2 (two) times daily. 10 capsule 0  . GLIPIZIDE PO Take by mouth.    . isosorbide mononitrate (IMDUR) 60 MG 24 hr tablet Take 60 mg by mouth daily.    Marland Kitchen omega-3 acid ethyl esters (LOVAZA) 1 G capsule Take 1 g by mouth daily.    Marland Kitchen oxyCODONE-acetaminophen (PERCOCET) 10-325 MG tablet Take 1 tablet by mouth 4 (four) times daily. Scheduled. Use with stool softerner. Refill on 09/18/2015 60 tablet 0  . polyethylene glycol powder (GLYCOLAX/MIRALAX) powder MIX 1 CAPFUL (17GM)  IN  LIQUID  AND  DRINK EVERY DAY AS DIRECTED 1530 g 3  . potassium chloride SA (K-DUR,KLOR-CON) 20 MEQ tablet Take 1 tablet (20 mEq total) by mouth daily. 30 tablet 6  . prednisoLONE acetate (PRED FORTE) 1 % ophthalmic suspension Place 1 drop into both eyes 4 (four) times daily.    Marland Kitchen torsemide (DEMADEX) 20 MG tablet TAKE 2 TABLETS BY MOUTH 2 TIMES DAILY 120 tablet 4   No current facility-administered medications on file prior to visit.    Review of Systems:  As per HPI- otherwise negative.   Physical Examination: Filed Vitals:   09/12/15 1728  BP: 104/58  Pulse: 98  Temp: 98.5 F (36.9 C)  Resp: 14   Filed Vitals:   09/12/15 1728  Height:  (1.676 m)  Weight: 229 lb 3.2 oz (103.964 kg)   Body mass index is 37.01 kg/(m^2). Ideal Body  Weight: Weight in (lb) to have BMI = 25: 154.6  GEN: WDWN, NAD, Non-toxic, A & O x 3, obese, looks well HEENT: Atraumatic, Normocephalic. Neck supple. No masses, No LAD. Ears and Nose: No external deformity. CV: RRR, No M/G/R. No JVD. No thrill. No extra heart sounds. PULM: CTA B, no wheezes, crackles, rhonchi. No retractions. No resp. distress. No accessory muscle use. ABD: S, NT, ND. EXTR: No c/c/minimal BLE edema NEURO Normal gait for pt, uses a cane PSYCH: Normally interactive. Conversant. Not depressed or anxious appearing.  Calm demeanor.    Assessment and Plan: Chronic systolic congestive heart failure (HCC) - Plan: Basic metabolic panel  Chronic renal disease, stage 4 (severe) (HCC) - Plan: Basic metabolic panel  Here today for a weight check and BMP His weight is a bit up.  Offered to have him admitted today.  He declines this, wants to wait on his BMP. I agreed but suspect his renal function may be worse.  Will call him tomorrow when labs are in.  My impression is that his heart failure is getting worse recently and he may not be aware of his long- term outlook.  Encouraged him to discuss future plans with his cardiologist soon  Signed Abbe Amsterdam, MD  Called 10/22- repeat BMP as below  Results for orders placed or performed in visit on 09/12/15  Basic metabolic panel  Result Value Ref Range   Sodium 136 135 - 146 mmol/L   Potassium 3.7 3.5 - 5.3 mmol/L   Chloride 95 (L) 98 - 110 mmol/L   CO2 30 20 - 31 mmol/L   Glucose, Bld 97 65 - 99 mg/dL   BUN 79 (H)  7 - 25 mg/dL   Creat 6.00 (H) 4.59 - 1.25 mg/dL   Calcium 9.7 8.6 - 97.7 mg/dL   Renal function is a bit worse but still below 3.  He reports his weight was 224 this am, he feels well.  He will see Dr. Gala Romney in 48 hours for follow-up. Ok to re-eval then

## 2015-09-13 LAB — BASIC METABOLIC PANEL
BUN: 79 mg/dL — AB (ref 7–25)
CALCIUM: 9.7 mg/dL (ref 8.6–10.3)
CO2: 30 mmol/L (ref 20–31)
Chloride: 95 mmol/L — ABNORMAL LOW (ref 98–110)
Creat: 2.8 mg/dL — ABNORMAL HIGH (ref 0.70–1.25)
GLUCOSE: 97 mg/dL (ref 65–99)
Potassium: 3.7 mmol/L (ref 3.5–5.3)
SODIUM: 136 mmol/L (ref 135–146)

## 2015-09-15 ENCOUNTER — Encounter (HOSPITAL_COMMUNITY): Payer: Self-pay

## 2015-09-15 ENCOUNTER — Ambulatory Visit (HOSPITAL_COMMUNITY)
Admit: 2015-09-15 | Discharge: 2015-09-15 | Disposition: A | Discharge status: Home / Self Care | Payer: Commercial Managed Care - HMO | Source: Ambulatory Visit | Attending: Cardiology | Admitting: Cardiology

## 2015-09-15 ENCOUNTER — Other Ambulatory Visit: Payer: Self-pay | Admitting: Family Medicine

## 2015-09-15 ENCOUNTER — Telehealth (HOSPITAL_COMMUNITY): Payer: Self-pay | Admitting: Vascular Surgery

## 2015-09-15 ENCOUNTER — Other Ambulatory Visit: Payer: Self-pay | Admitting: *Deleted

## 2015-09-15 VITALS — BP 106/72 | HR 94 | Wt 231.1 lb

## 2015-09-15 DIAGNOSIS — I130 Hypertensive heart and chronic kidney disease with heart failure and stage 1 through stage 4 chronic kidney disease, or unspecified chronic kidney disease: Secondary | ICD-10-CM | POA: Diagnosis not present

## 2015-09-15 DIAGNOSIS — R0902 Hypoxemia: Secondary | ICD-10-CM | POA: Insufficient documentation

## 2015-09-15 DIAGNOSIS — J438 Other emphysema: Secondary | ICD-10-CM

## 2015-09-15 DIAGNOSIS — E1122 Type 2 diabetes mellitus with diabetic chronic kidney disease: Secondary | ICD-10-CM | POA: Diagnosis not present

## 2015-09-15 DIAGNOSIS — Z87891 Personal history of nicotine dependence: Secondary | ICD-10-CM | POA: Insufficient documentation

## 2015-09-15 DIAGNOSIS — M797 Fibromyalgia: Secondary | ICD-10-CM | POA: Diagnosis not present

## 2015-09-15 DIAGNOSIS — G4733 Obstructive sleep apnea (adult) (pediatric): Secondary | ICD-10-CM

## 2015-09-15 DIAGNOSIS — M109 Gout, unspecified: Secondary | ICD-10-CM | POA: Diagnosis not present

## 2015-09-15 DIAGNOSIS — N184 Chronic kidney disease, stage 4 (severe): Secondary | ICD-10-CM | POA: Diagnosis not present

## 2015-09-15 DIAGNOSIS — Z7902 Long term (current) use of antithrombotics/antiplatelets: Secondary | ICD-10-CM | POA: Diagnosis not present

## 2015-09-15 DIAGNOSIS — I5023 Acute on chronic systolic (congestive) heart failure: Secondary | ICD-10-CM | POA: Diagnosis present

## 2015-09-15 DIAGNOSIS — I739 Peripheral vascular disease, unspecified: Secondary | ICD-10-CM | POA: Diagnosis not present

## 2015-09-15 DIAGNOSIS — Z8673 Personal history of transient ischemic attack (TIA), and cerebral infarction without residual deficits: Secondary | ICD-10-CM | POA: Diagnosis not present

## 2015-09-15 DIAGNOSIS — K219 Gastro-esophageal reflux disease without esophagitis: Secondary | ICD-10-CM | POA: Diagnosis not present

## 2015-09-15 DIAGNOSIS — Z833 Family history of diabetes mellitus: Secondary | ICD-10-CM | POA: Insufficient documentation

## 2015-09-15 DIAGNOSIS — I480 Paroxysmal atrial fibrillation: Secondary | ICD-10-CM | POA: Insufficient documentation

## 2015-09-15 DIAGNOSIS — I428 Other cardiomyopathies: Secondary | ICD-10-CM | POA: Diagnosis not present

## 2015-09-15 DIAGNOSIS — J45909 Unspecified asthma, uncomplicated: Secondary | ICD-10-CM | POA: Insufficient documentation

## 2015-09-15 DIAGNOSIS — J449 Chronic obstructive pulmonary disease, unspecified: Secondary | ICD-10-CM | POA: Insufficient documentation

## 2015-09-15 DIAGNOSIS — I5022 Chronic systolic (congestive) heart failure: Secondary | ICD-10-CM | POA: Insufficient documentation

## 2015-09-15 DIAGNOSIS — E785 Hyperlipidemia, unspecified: Secondary | ICD-10-CM | POA: Insufficient documentation

## 2015-09-15 DIAGNOSIS — Z79899 Other long term (current) drug therapy: Secondary | ICD-10-CM | POA: Insufficient documentation

## 2015-09-15 LAB — BASIC METABOLIC PANEL
Anion gap: 14 (ref 5–15)
BUN: 77 mg/dL — AB (ref 6–20)
CALCIUM: 10.1 mg/dL (ref 8.9–10.3)
CO2: 28 mmol/L (ref 22–32)
CREATININE: 2.84 mg/dL — AB (ref 0.61–1.24)
Chloride: 95 mmol/L — ABNORMAL LOW (ref 101–111)
GFR calc Af Amer: 25 mL/min — ABNORMAL LOW (ref >60)
GFR, EST NON AFRICAN AMERICAN: 22 mL/min — AB (ref >60)
GLUCOSE: 76 mg/dL (ref 65–99)
Potassium: 3.8 mmol/L (ref 3.5–5.1)
Sodium: 137 mmol/L (ref 135–145)

## 2015-09-15 LAB — BRAIN NATRIURETIC PEPTIDE: B Natriuretic Peptide: 501 pg/mL — ABNORMAL HIGH (ref 0.0–100.0)

## 2015-09-15 MED ORDER — METOLAZONE 2.5 MG PO TABS: 2.5000 mg | ORAL_TABLET | ORAL | Status: DC

## 2015-09-15 MED ORDER — TIOTROPIUM BROMIDE MONOHYDRATE 18 MCG IN CAPS: 18.0000 ug | ORAL_CAPSULE | Freq: Every day | RESPIRATORY_TRACT | Status: DC

## 2015-09-15 MED ORDER — HYDRALAZINE HCL 50 MG PO TABS: 50.0000 mg | ORAL_TABLET | Freq: Three times a day (TID) | ORAL | Status: DC

## 2015-09-15 NOTE — Telephone Encounter (Signed)
Called Dr. Merrily Brittle office to get pt rescheduled for appt he missed , the office stated the pt needs to call them to reschedule due to no show fees. I called pt left a message to call office to get appt rescheduled

## 2015-09-15 NOTE — Progress Notes (Addendum)
Patient ID: JORGEN MERRITT, male   DOB: 07-26-1951, 64 y.o.   MRN: [id number] PCP: Dr. Arthor Captain Nephrologist: Dr. Briant Cedar  HPI: Mr. Mihalik is a 64 y.o. male with a hx of obesity, PAF, HTN, COPD, HTN, gout, DM 2, CVA and CHF EF 25% due to NICM. Intolerant Entresto at the lowest dose due to syncope.   Has previously followed in the [facility] in [place]. Previous cath in 2007 showed NICM with normal cors. Was living alone in [place] and apparently had issues managing medications by himself and was getting confused with PCP and cardiologists medications and was not taking his meds correctly. He moved to [place] to be near his nieces. Issues in the past with ACE-I due to renal failure. Highest creatinine in our system is ~1.5 but was apparently higher previously - thought to be related to taking his medications incorrectly.   Was admitted in 06/2013 from [facility] with left sided weakness and was found to have a moderate to large right hemispheric infarct. Went back to [facility].   Admitted to [facility] 10/14 through 09/09/13 with ADHF. Diuresed 30 pounds with IV lasix and transitioned to lasix 80 mg po bid. Continued on 25 mg carvedilol bid, isordil 20 mg daily.  Started on lisinopril 2.5 mg daily. Discharge weight 229 pounds. 09/29/13 CT abdomen and pelvis - no obstruction . Diverticulosis. No inflammatory changes.   Admitted 11/23/13 through 11/26/13 with volume overload and dyspnea. Had worsening cardiorenal syndrome. Nephrology consulted and managed with IV lasix and transitioned to torsemide 40 mg twice a day.  Diuresed 25 pounds. Discharge weight 232 pounds. Hydralazine, Spiro, and Lisinopril stopped at discharge. He is also off digoxin.    Admitted 07/01/15 for AKI after presumed over diuresis. His Cr on admit was 4.6., though his baseline is 2.  We followed him in the hospital and held diuresis for several days.  Cr on discharge was 2.36. We decreased his spiro to 12.5 mg, and his diuretic  regimen was continued at 40 mg torsemide BID.  Discharge weight was 229.  Admitted in 10/16 with acute on chronic systolic CHF and AKI with creatinine up to 7.1 => ATN.  Suspected cardiorenal syndrome, but surprisingly co-ox was normal.  CVVH was required but renal function gradually improved.   He returns for followup today.  He has been seen by Dr. Patsy Lager a couple of times since his discharge.  He was noted to be building up fluid again.  Torsemide was increased to 60 mg bid on 09/10/15, and he was given metolazone to use prn.  He took metolazone on Saturday and Sunday.  Compared to last appointment in this office, weight is up 5 lbs.  He is drinking a lot of fluid.  He is fatigued walking on flat ground but not particularly short of breath. He has chronic 2 pillow orthopnea.  He is short of breath with more than a short flight of steps.  No palpitations, no chest pain. Of note, he thinks that he is taking lisinopril and spironolactone (was supposed to be off these medications).   ECG: sinus rhythm, IVCD 121 msec  PFTs (9/16): Severe obstruction.    ECHO 11/08/13 EF 15%  ECHO 07/05/14: EF 15%  ECHO 11/26/2014: EF 20%  ECHO 10/16: EF 25-30%, mild LV dilation, diffuse hypokinesis, mild MR, mild RV dilation  08/13/14 RA = 7  RV = 50/3/10  PA = 54/22 (34)  PCW = 16  Fick cardiac output/index = 6.9/3.0  Thermo CO/CI =  4.7/2.1  PVR = 4.1 WU  Ao sat = 94%  PA sat = 68%,68%  SVC sat (sheath) = 62%  Labs 08/22/14 K 4.4 creatinine 1.6  09/02/14 Creatinine 1.6 10/20/14: creatinine 2.58, K 5.1 11/23/2014: Creatinine 4.12 --Ace and Spiro stopped 11/24/2014: Creatinine 3.55 1/4/12016: 2.49  11/26/2014: Hgb 10.4 Hct 31.7 Creatinine 1.94  12/03/14: K 4, creatinine 2.25 02/19/2015: K 3.8 Creatinine 2.66  05/05/2015: K 3.4 Creatinine 2.68   8/16: K 3.5 Creatinine 2.36 => 2.84, hgb 10.4, BNP 523 10/16: K 3.7, creatinine 2.57 => 2.6 => 2.8  SH: Retired Engineer, site 2011 Lives with his niece  FH: Father  DM  Mom had lung cancer  ROS: All systems negative except as listed in HPI, PMH and Problem List.  Past Medical History  Diagnosis Date  . Atrial fibrillation (HCC)     PAF  . Hypertension   . COPD (chronic obstructive pulmonary disease) (HCC)   . Ocular herpes   . Gout   . CHF (congestive heart failure) (HCC)   . Chronic pain   . Hyperlipidemia   . Nonischemic cardiomyopathy (HCC) Aug 2015    EF 15%  . Ocular herpes   . Neuropathy (HCC)   . Left-sided weakness     "because of the arthritis and edema"  . PVD (peripheral vascular disease) [facility]) May 2015    abnormal ABIs  . Memory impairment     "short term"  . CKD (chronic kidney disease), stage III     Dr. Briant Cedar follows-holding   . Fibromyalgia     neuropathy- hands, more than feet.  . Sleep apnea with use of continuous positive airway pressure (CPAP)     does not use cpap  . GERD (gastroesophageal reflux disease)     tums as needed  . Asthma   . Type II diabetes mellitus (HCC)   . Stroke [facility]) X 5    last stroke aug 2014"; denies residual on 09/12/2014  . Osteoarthritis   . Arthritis     "knees, right elbow, shoulder" (09/12/2014)  . Shortness of breath dyspnea     Current Outpatient Prescriptions  Medication Sig Dispense Refill  . albuterol (PROVENTIL HFA;VENTOLIN HFA) 108 (90 BASE) MCG/ACT inhaler Inhale 2 puffs into the lungs every 6 (six) hours as needed for wheezing or shortness of breath. 1 Inhaler 1  . allopurinol (ZYLOPRIM) 100 MG tablet Take 100 mg by mouth daily.    Marland Kitchen apixaban (ELIQUIS) 5 MG TABS tablet Take 1 tablet (5 mg total) by mouth 2 (two) times daily. 180 tablet 3  . baclofen (LIORESAL) 10 MG tablet TAKE 1 TABLET EVERY 8 HOURS AS NEEDED FOR MUSCLE SPASM(S) 90 tablet 1  . carvedilol (COREG) 25 MG tablet Take 12.5 mg by mouth 2 (two) times daily with a meal.    . diclofenac (FLECTOR) 1.3 % PTCH Place 1 patch onto the skin 2 (two) times daily as needed (pain).    Marland Kitchen docusate sodium 100 MG CAPS  Take 100 mg by mouth 2 (two) times daily. 10 capsule 0  . GLIPIZIDE PO Take by mouth.    . isosorbide mononitrate (IMDUR) 60 MG 24 hr tablet Take 60 mg by mouth daily.    Marland Kitchen omega-3 acid ethyl esters (LOVAZA) 1 G capsule Take 1 g by mouth daily.    Marland Kitchen oxyCODONE-acetaminophen (PERCOCET) 10-325 MG tablet Take 1 tablet by mouth 4 (four) times daily. Scheduled. Use with stool softerner. Refill on 09/18/2015 60 tablet 0  . polyethylene  glycol powder (GLYCOLAX/MIRALAX) powder MIX 1 CAPFUL (17GM)  IN  LIQUID  AND  DRINK EVERY DAY AS DIRECTED 1530 g 3  . potassium chloride SA (K-DUR,KLOR-CON) 20 MEQ tablet Take 1 tablet (20 mEq total) by mouth daily. 30 tablet 6  . torsemide (DEMADEX) 20 MG tablet Take 60 mg by mouth 2 (two) times daily.    . hydrALAZINE (APRESOLINE) 50 MG tablet Take 1 tablet (50 mg total) by mouth 3 (three) times daily. 90 tablet 3  . metolazone (ZAROXOLYN) 2.5 MG tablet Take 1 tablet (2.5 mg total) by mouth as directed. 20 tablet 2  . tiotropium (SPIRIVA HANDIHALER) 18 MCG inhalation capsule Place 1 capsule (18 mcg total) into inhaler and inhale daily. 30 capsule 12   No current facility-administered medications for this encounter.    Filed Vitals:   09/15/15 0930  BP: 106/72  Pulse: 94  Weight: 231 lb 1.9 oz (104.835 kg)  SpO2: 92%   PHYSICAL EXAM: General:  Obese male; Ambulated into clinic without difficulty.  HEENT: normal Neck: supple. JVP 9-10 cm.  Carotids 2+ bilaterally; no bruits. No lymphadenopathy or thryomegaly noted. Cor: PMI nonpalpable. Regular rate & rhythm. No rubs, gallops or murmurs appreciated Lungs: Distant breath sounds but clear lungs. Abdomen: obese. soft, nontender, + distended. No HSM. No bruits or masses. Good bowel sounds. Extremities: no cyanosis, clubbing, rash.  1+ edema to knees bilaterally. Compression stockings in place.   Neuro: alert & orientedx3, cranial nerves grossly intact. Affect pleasant.  ASSESSMENT & PLAN:  1) Chronic Systolic  Heart Failure: Nonischemic cardiomyopathy, EF 25-30% in 10/16. NYHA III symptoms.  He has refused ICD.  He is volume overloaded on exam today, and weight has been trending up since recent discharge.  We are having trouble balancing diuresis/fluid removal with his renal dysfunction. - Continue torsemide 60 mg BID.  - Take metolazone 2.5 mg 30 minutes before am torsemide Tuesday, Wednesday, and Friday this week.  After this week, take it on Tuesdays and Fridays. - BMET/BNP today and repeat in 1 week.  - He needs to stop lisinopril and spironolactone with renal failure.  - Continue coreg 12.5 mg BID.  - Continue Imdur + hydralazine 50 mg tid.    - Off digoxin due to CKD.  - He remains firm in his decision to not pursue ICD.  - Reinforced the need and importance of daily weights, a low sodium diet and to avoid soups, and fluid restriction (less than 2 L a day). Instructed to call the HF clinic if weight increases more than 3 lbs overnight or 5 lbs in a week.  2) Gout: Continue allopurinol. Continue to follow with rheumatology.  3) Atrial fibrillation: Paroxysmal.  NSR today. Continue Eliquis.  4) CKD stage IV: Creatinine up to 2.8. Follows with nephrology, will help him arrange a followup appt.  Cardiorenal syndrome has been progressive and makes volume management more difficult.  He may progress to need HD for volume removal in the future.  5) COPD:  Severe obstruction on PFTs.  He has had documented exertional hypoxemia but has refused home oxygen.  Prior smoker.  I will give him a prescription for Spiriva and have him followup with pulmonary.  6) Suspected OSA: Sleep study scheduled.   Followup in 1 week.   Marca Ancona 09/15/2015

## 2015-09-15 NOTE — Addendum Note (Signed)
Encounter addended by: Laurey Morale, MD on: 09/15/2015  9:34 PM<BR>     Documentation filed: Notes Section

## 2015-09-15 NOTE — Patient Outreach (Signed)
[facility]) Care Management  09/15/2015  AKILLES GEIB 11/13/1951 [id number]  Transition of care (Third Outreach)  RN attempted to contact pt today however unsuccessful. RN able to leave a HIPAA approved voice message requesting a call back.   [name], RN Care Management Coordinator [facility] Main Office [phone]

## 2015-09-15 NOTE — Patient Instructions (Signed)
START Spiriva inhaler, use as directed daily CONTINUE Hydralazine 50 mg, one tab three times per day STOP Lisinopril and Spironolactone RESTART Metolazone 2.5 mg, take one pill on Tuesday, Wednesday and Friday this week, starting next week only take on Tuesday and Fridays  Labs today  Your physician recommends that you schedule a follow-up appointment in: 1 week  You have been referred to Nephrology_ Dr.mattingley and [facility] for further evaluation of COPD  Do the following things EVERYDAY: 1) Weigh yourself in the morning before breakfast. Write it down and keep it in a log. 2) Take your medicines as prescribed 3) Eat low salt foods-Limit salt (sodium) to 2000 mg per day.  4) Stay as active as you can everyday 5) Limit all fluids for the day to less than 2 liters 6)

## 2015-09-16 ENCOUNTER — Other Ambulatory Visit: Payer: Self-pay | Admitting: Internal Medicine

## 2015-09-17 ENCOUNTER — Other Ambulatory Visit: Payer: Self-pay | Admitting: *Deleted

## 2015-09-17 NOTE — Patient Outreach (Signed)
[facility]) Care Management  09/17/2015  DUB SOLTANI 1951-05-18 [id number]  Transition of Care  RN spoke with pt today who reports he continues to do well with daily weight reported today 225 lbs and yesterday 227 lbs. Pt states he is aware of the HF zones and if 3 lbs gain over night or 5 lbs within one week to contact his providers. Pt denies any symptoms of distress at this time. Pt reports he will be set up on the telephonic monitoring system with weights for his HF with the [facility] agency tomorrow by the nurse and PT also continues to visit for strengthening exercises. Pt also verifies  He remains in the GREEN zone with reported issues. Pt states his doctor has discontinued Lisinopril and Spironolactone ( due to his renal issues)  from his daily medications. RN continues to extend home visits via [facility] however pt has stated he is very aware of the HF zones and what to do if acute symptoms occur from previous involvement with this RN in the past. Pt feels his issues related to other medical conditions that result in  fluid retention however remains receptive to another transition of care call next Monday. Will schedule accordingly for ongoing follow up.  [name], RN Care Management Coordinator [facility] Main Office [phone]

## 2015-09-22 ENCOUNTER — Encounter: Payer: Self-pay | Admitting: *Deleted

## 2015-09-22 ENCOUNTER — Encounter (HOSPITAL_COMMUNITY): Payer: Self-pay

## 2015-09-22 ENCOUNTER — Telehealth: Payer: Self-pay

## 2015-09-22 ENCOUNTER — Other Ambulatory Visit: Payer: Self-pay | Admitting: *Deleted

## 2015-09-22 ENCOUNTER — Ambulatory Visit (HOSPITAL_COMMUNITY)
Admission: RE | Admit: 2015-09-22 | Discharge: 2015-09-22 | Disposition: A | Discharge status: Home / Self Care | Payer: Commercial Managed Care - HMO | Source: Ambulatory Visit | Attending: Internal Medicine | Admitting: Internal Medicine

## 2015-09-22 VITALS — BP 122/70 | HR 92 | Wt 234.8 lb

## 2015-09-22 DIAGNOSIS — Z79899 Other long term (current) drug therapy: Secondary | ICD-10-CM | POA: Insufficient documentation

## 2015-09-22 DIAGNOSIS — I5023 Acute on chronic systolic (congestive) heart failure: Secondary | ICD-10-CM

## 2015-09-22 DIAGNOSIS — Z87891 Personal history of nicotine dependence: Secondary | ICD-10-CM | POA: Insufficient documentation

## 2015-09-22 DIAGNOSIS — I5022 Chronic systolic (congestive) heart failure: Secondary | ICD-10-CM | POA: Diagnosis present

## 2015-09-22 DIAGNOSIS — E1122 Type 2 diabetes mellitus with diabetic chronic kidney disease: Secondary | ICD-10-CM | POA: Diagnosis not present

## 2015-09-22 DIAGNOSIS — N184 Chronic kidney disease, stage 4 (severe): Secondary | ICD-10-CM | POA: Insufficient documentation

## 2015-09-22 DIAGNOSIS — R0902 Hypoxemia: Secondary | ICD-10-CM | POA: Insufficient documentation

## 2015-09-22 DIAGNOSIS — I480 Paroxysmal atrial fibrillation: Secondary | ICD-10-CM | POA: Diagnosis not present

## 2015-09-22 DIAGNOSIS — J45909 Unspecified asthma, uncomplicated: Secondary | ICD-10-CM | POA: Insufficient documentation

## 2015-09-22 DIAGNOSIS — M109 Gout, unspecified: Secondary | ICD-10-CM | POA: Diagnosis not present

## 2015-09-22 DIAGNOSIS — I428 Other cardiomyopathies: Secondary | ICD-10-CM | POA: Diagnosis not present

## 2015-09-22 DIAGNOSIS — J449 Chronic obstructive pulmonary disease, unspecified: Secondary | ICD-10-CM | POA: Insufficient documentation

## 2015-09-22 DIAGNOSIS — Z833 Family history of diabetes mellitus: Secondary | ICD-10-CM | POA: Diagnosis not present

## 2015-09-22 DIAGNOSIS — K219 Gastro-esophageal reflux disease without esophagitis: Secondary | ICD-10-CM | POA: Diagnosis not present

## 2015-09-22 DIAGNOSIS — Z8673 Personal history of transient ischemic attack (TIA), and cerebral infarction without residual deficits: Secondary | ICD-10-CM | POA: Insufficient documentation

## 2015-09-22 DIAGNOSIS — I130 Hypertensive heart and chronic kidney disease with heart failure and stage 1 through stage 4 chronic kidney disease, or unspecified chronic kidney disease: Secondary | ICD-10-CM | POA: Diagnosis not present

## 2015-09-22 DIAGNOSIS — M797 Fibromyalgia: Secondary | ICD-10-CM | POA: Insufficient documentation

## 2015-09-22 DIAGNOSIS — I739 Peripheral vascular disease, unspecified: Secondary | ICD-10-CM | POA: Insufficient documentation

## 2015-09-22 DIAGNOSIS — Z7902 Long term (current) use of antithrombotics/antiplatelets: Secondary | ICD-10-CM | POA: Diagnosis not present

## 2015-09-22 DIAGNOSIS — E785 Hyperlipidemia, unspecified: Secondary | ICD-10-CM | POA: Diagnosis not present

## 2015-09-22 LAB — BASIC METABOLIC PANEL
Anion gap: 11 (ref 5–15)
BUN: 60 mg/dL — ABNORMAL HIGH (ref 6–20)
CHLORIDE: 98 mmol/L — AB (ref 101–111)
CO2: 28 mmol/L (ref 22–32)
CREATININE: 2.08 mg/dL — AB (ref 0.61–1.24)
Calcium: 9.2 mg/dL (ref 8.9–10.3)
GFR calc non Af Amer: 32 mL/min — ABNORMAL LOW (ref >60)
GFR, EST AFRICAN AMERICAN: 37 mL/min — AB (ref >60)
Glucose, Bld: 115 mg/dL — ABNORMAL HIGH (ref 65–99)
Potassium: 3.1 mmol/L — ABNORMAL LOW (ref 3.5–5.1)
Sodium: 137 mmol/L (ref 135–145)

## 2015-09-22 MED ORDER — POTASSIUM CHLORIDE CRYS ER 20 MEQ PO TBCR: 20.0000 meq | EXTENDED_RELEASE_TABLET | Freq: Two times a day (BID) | ORAL | Status: DC

## 2015-09-22 MED ORDER — TORSEMIDE 20 MG PO TABS
80.0000 mg | ORAL_TABLET | Freq: Two times a day (BID) | ORAL | Status: DC
Start: 2015-09-22 — End: 2015-11-24

## 2015-09-22 NOTE — Progress Notes (Signed)
[facility] CLINIC NOTE  Patient ID: Frank Davidson, male   DOB: [birth year], 64 y.o.   MRN: [id number] PCP: Dr. Arthor Captain Nephrologist: Dr. Briant Cedar HF: Christan Defranco  HPI: Frank Davidson is a 64 y.o. male with a hx of obesity, PAF, HTN, COPD, HTN, gout, DM 2, CVA and CHF EF 25% due to NICM. Intolerant Entresto at the lowest dose due to syncope.   Has previously followed in the [facility] in [place]. Previous cath in 2007 showed NICM with normal cors. Was living alone in [place] and apparently had issues managing medications by himself and was getting confused with PCP and cardiologists medications and was not taking his meds correctly. He moved to [place] to be near his nieces. Issues in the past with ACE-I due to renal failure. Highest creatinine in our system is ~1.5 but was apparently higher previously - thought to be related to taking his medications incorrectly.   Was admitted in 06/2013 from [facility] with left sided weakness and was found to have a moderate to large right hemispheric infarct. Went back to [facility].   Admitted to [facility] 10/14 through 09/09/13 with ADHF. Diuresed 30 pounds with IV lasix and transitioned to lasix 80 mg po bid. Continued on 25 mg carvedilol bid, isordil 20 mg daily.  Started on lisinopril 2.5 mg daily. Discharge weight 229 pounds. 09/29/13 CT abdomen and pelvis - no obstruction . Diverticulosis. No inflammatory changes.   Admitted 11/23/13 through 11/26/13 with volume overload and dyspnea. Had worsening cardiorenal syndrome. Nephrology consulted and managed with IV lasix and transitioned to torsemide 40 mg twice a day.  Diuresed 25 pounds. Discharge weight 232 pounds. Hydralazine, Spiro, and Lisinopril stopped at discharge. He is also off digoxin.    Admitted 07/01/15 for AKI after presumed over diuresis. His Cr on admit was 4.6., though his baseline is 2.  We followed him in the hospital and held diuresis for several days.  Cr on discharge was 2.36. We  decreased his spiro to 12.5 mg, and his diuretic regimen was continued at 40 mg torsemide BID.  Discharge weight was 229.  Admitted in 10/16 with acute on chronic systolic CHF and AKI with creatinine up to 7.1 => ATN.  Suspected cardiorenal syndrome, but surprisingly co-ox was normal.  CVVH was required but renal function gradually improved. D/c weight 212 pounds.  He returns for followup today.  He has been seen by Dr. Patsy Lager a couple of times since his discharge and also saw Dr. Shirlee Latch last week. Torsemide was increased to 60 mg bid on 09/10/15, and Dr. Shirlee Latch prescribed metolazone 2x/week. However he says he is taking it every other day. He said weight is now stable at home at 230 but he is actually 3 pounds higher this week than last week. He is eating and drinking a lot. He has HHPT and gets winded with mild activity. He has not missed any medications. Says his memory is not working very well.No palpitations, no chest pain. He missed his appt with Dr. Briant Cedar last week.   ECG: sinus rhythm, IVCD 121 msec  PFTs (9/16): Severe obstruction.    ECHO 11/08/13 EF 15%  ECHO 07/05/14: EF 15%  ECHO 11/26/2014: EF 20%  ECHO 10/16: EF 25-30%, mild LV dilation, diffuse hypokinesis, mild MR, mild RV dilation  08/13/14 RA = 7  RV = 50/3/10  PA = 54/22 (34)  PCW = 16  Fick cardiac output/index = 6.9/3.0  Thermo CO/CI = 4.7/2.1  PVR = 4.1 WU  Ao sat = 94%  PA sat = 68%,68%  SVC sat (sheath) = 62%  Labs 08/22/14 K 4.4 creatinine 1.6  09/02/14 Creatinine 1.6 10/20/14: creatinine 2.58, K 5.1 11/23/2014: Creatinine 4.12 --Ace and Spiro stopped 11/24/2014: Creatinine 3.55 1/4/12016: 2.49  11/26/2014: Hgb 10.4 Hct 31.7 Creatinine 1.94  12/03/14: K 4, creatinine 2.25 02/19/2015: K 3.8 Creatinine 2.66  05/05/2015: K 3.4 Creatinine 2.68   8/16: K 3.5 Creatinine 2.36 => 2.84, hgb 10.4, BNP 523 10/16: K 3.7, creatinine 2.57 => 2.6 => 2.8 09/12/15: K 3.7, creatinine 2.82, BUN 79 09/15/15: K 3.8, creatinine  2.84, BUN 77  SH: Retired Engineer, 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  Past Medical History  Diagnosis Date  . Atrial fibrillation (HCC)     PAF  . Hypertension   . COPD (chronic obstructive pulmonary disease) (HCC)   . Ocular herpes   . Gout   . CHF (congestive heart failure) (HCC)   . Chronic pain   . Hyperlipidemia   . Nonischemic cardiomyopathy (HCC) Aug 2015    EF 15%  . Ocular herpes   . Neuropathy (HCC)   . Left-sided weakness     "because of the arthritis and edema"  . PVD (peripheral vascular disease) [facility]) May 2015    abnormal ABIs  . Memory impairment     "short term"  . CKD (chronic kidney disease), stage III     Dr. Briant Cedar follows-holding   . Fibromyalgia     neuropathy- hands, more than feet.  . Sleep apnea with use of continuous positive airway pressure (CPAP)     does not use cpap  . GERD (gastroesophageal reflux disease)     tums as needed  . Asthma   . Type II diabetes mellitus (HCC)   . Stroke [facility]) X 5    last stroke aug 2014"; denies residual on 09/12/2014  . Osteoarthritis   . Arthritis     "knees, right elbow, shoulder" (09/12/2014)  . Shortness of breath dyspnea     Current Outpatient Prescriptions  Medication Sig Dispense Refill  . albuterol (PROVENTIL HFA;VENTOLIN HFA) 108 (90 BASE) MCG/ACT inhaler Inhale 2 puffs into the lungs every 6 (six) hours as needed for wheezing or shortness of breath. 1 Inhaler 1  . allopurinol (ZYLOPRIM) 100 MG tablet Take 100 mg by mouth daily.    Marland Kitchen apixaban (ELIQUIS) 5 MG TABS tablet Take 1 tablet (5 mg total) by mouth 2 (two) times daily. 180 tablet 3  . baclofen (LIORESAL) 10 MG tablet TAKE 1 TABLET EVERY 8 HOURS AS NEEDED FOR MUSCLE SPASM(S) 90 tablet 1  . carvedilol (COREG) 25 MG tablet Take 12.5 mg by mouth 2 (two) times daily with a meal.    . diclofenac (FLECTOR) 1.3 % PTCH Place 1 patch onto  the skin 2 (two) times daily as needed (pain).    Marland Kitchen docusate sodium 100 MG CAPS Take 100 mg by mouth 2 (two) times daily. 10 capsule 0  . GLIPIZIDE PO Take by mouth.    . hydrALAZINE (APRESOLINE) 50 MG tablet Take 1 tablet (50 mg total) by mouth 3 (three) times daily. 90 tablet 3  . isosorbide mononitrate (IMDUR) 60 MG 24 hr tablet Take 60 mg by mouth daily.    . metolazone (ZAROXOLYN) 2.5 MG tablet Take 1 tablet (2.5 mg total) by mouth as directed. 20 tablet  2  . metolazone (ZAROXOLYN) 5 MG tablet Take 2.5mg  (1/2) as needed for swelling. 15 tablet 2  . omega-3 acid ethyl esters (LOVAZA) 1 G capsule Take 1 g by mouth daily.    Marland Kitchen oxyCODONE-acetaminophen (PERCOCET) 10-325 MG tablet Take 1 tablet by mouth 4 (four) times daily. Scheduled. Use with stool softerner. Refill on 09/18/2015 60 tablet 0  . polyethylene glycol powder (GLYCOLAX/MIRALAX) powder MIX 1 CAPFUL (17GM)  IN  LIQUID  AND  DRINK EVERY DAY AS DIRECTED 1530 g 3  . potassium chloride SA (K-DUR,KLOR-CON) 20 MEQ tablet Take 1 tablet (20 mEq total) by mouth daily. 30 tablet 6  . tiotropium (SPIRIVA HANDIHALER) 18 MCG inhalation capsule Place 1 capsule (18 mcg total) into inhaler and inhale daily. 30 capsule 12  . torsemide (DEMADEX) 20 MG tablet Take 60 mg by mouth 2 (two) times daily.     No current facility-administered medications for this encounter.    Filed Vitals:   09/22/15 1520  BP: 122/70  Pulse: 92  Weight: 234 lb 12.8 oz (106.505 kg)  SpO2: 95%   Wt Readings from Last 3 Encounters:  09/22/15 234 lb 12.8 oz (106.505 kg)  09/15/15 231 lb 1.9 oz (104.835 kg)  09/12/15 229 lb 3.2 oz (103.964 kg)     PHYSICAL EXAM: General:  Obese male; Sitting in WC no resp difficutly HEENT: normal Neck: supple. JVP 9-10 cm.  Carotids 2+ bilaterally; no bruits. No lymphadenopathy or thryomegaly noted. Cor: PMI nonpalpable. Regular rate & rhythm. No rubs, gallops or murmurs appreciated Lungs: Distant breath sounds but clear  lungs. Abdomen: obese. soft, nontender, + distended. No HSM. No bruits or masses. Good bowel sounds. Extremities: no cyanosis, clubbing, rash.  1-2+ edema to knees bilaterally. Compression stockings in place.   Neuro: alert & orientedx3, cranial nerves grossly intact. Affect pleasant.  ASSESSMENT & PLAN:  1) Chronic Systolic Heart Failure: Nonischemic cardiomyopathy, EF 25-30% in 10/16. NYHA III symptoms.  He has refused ICD.  He is volume overloaded on exam today, and weight has been trending up since last discharge. Difficulty balancing diuresis/fluid removal with his renal dysfunction. - Increase torsemide to 80 mg BID and kcl to 20 bid - Take metolazone 2.5 mg 30 minutes before am torsemide on Tuesdays and Fridays. - BMET/BNP today and repeat in 1 week.  - He is off lisinopril and spironolactone with renal failure.  - Continue coreg 12.5 mg BID.  - Continue Imdur + hydralazine 50 mg tid.    - Off digoxin due to CKD.  - He remains firm in his decision to not pursue ICD.  - Reinforced the need and importance of daily weights, a low sodium diet and to avoid soups, and fluid restriction (less than 2 L a day). Instructed to call the HF clinic if weight increases more than 3 lbs overnight or 5 lbs in a week.  2) Gout: Continue allopurinol. Continue to follow with rheumatology.  3) Atrial fibrillation: Paroxysmal.  NSR today. Continue Eliquis.  4) CKD stage IV: Creatinine back down to 2.0.  Follows with nephrology. Recently required CVVHD in hospital. Management of diuretics as above. Watch renal function closely.  5) COPD:  Severe obstruction on PFTs.  He has had documented exertional hypoxemia but has refused home oxygen.  Prior smoker. - Follow-up with pulmonary.  6) Suspected OSA: Sleep study scheduled.   Followup in 2 weeks  Arvilla Meres MD  09/22/2015

## 2015-09-22 NOTE — Patient Instructions (Addendum)
INCREASE Torsemide to 80 mg, 4 tabs twice a day INCREASE Potassium to 20 meQ, one tab twice a day  Labs today  You have been referred to [facility]  Your physician recommends that you schedule a follow-up appointment in: 1 week  Do the following things EVERYDAY: 1) Weigh yourself in the morning before breakfast. Write it down and keep it in a log. 2) Take your medicines as prescribed 3) Eat low salt foods-Limit salt (sodium) to 2000 mg per day.  4) Stay as active as you can everyday 5) Limit all fluids for the day to less than 2 liters 6)

## 2015-09-22 NOTE — Patient Outreach (Signed)
[facility]) Care Management  09/22/2015  Frank Davidson June 05, 1951 [id number]   Last Transition of care outreach  RN spoke with pt today who indicates he has a follow up appointment with the heart failure cline (Dr. Gala Romney) today and continues to do well. Reported [facility] remains involved with PT and nursing with a tele-scale for ongoing HF monitoring. Services visit twice weekly.  Since the recent changes in pt's medication several weeks ago no additional fluid retention as pt continues to weigh daily and document all his readings. Reported yesterday and today's weight at 230 lbs with no swelling and pt states he is in the GREEN zone with no breathing problems. Verified pt's attendance with all medical appointments and no changes have have been to medications as pt adherent with all of his daily medications. RN reviewed the HF zones and once again extended offer for [facility] to visit in the home for ongoing education of prevention measures however pt very adamant about having the HF knowledge and awareness on what to do if acute symptoms should occur. Pt has stated he is doing what he is suppose and there has been adjustments based upon how his body reacts. RN has informed pt that [facility] will remain available if needed in the future. Pt verbalized an understanding and grateful for the recent follow up calls. RN has also offered telephonic disease management via [facility] but pt has also opt to decline both community visit and telephone outreach for ongoing [facility] services. Pt with understanding that his primary doctor will be notified of his decline to extend services with [facility] at this time and his case will be closed. Verified pt has contact number for [facility] and to reach out with any other inquires or needs. Call was ended.   [name], RN Care Management Coordinator [facility] Main Office [phone]

## 2015-09-22 NOTE — Telephone Encounter (Signed)
PT IS ASKING IF HE TAKE 2 OR 3 OF HIS tORSOESEME DIURETIC, DIRECTIONS SAY 2,BUT HE SAYS PROVIDER SAYS 3   BEST PHONE FOR PT IS [phone]   [facility] [address]

## 2015-09-23 ENCOUNTER — Telehealth: Payer: Self-pay

## 2015-09-23 ENCOUNTER — Institutional Professional Consult (permissible substitution): Payer: Commercial Managed Care - HMO | Admitting: Pulmonary Disease

## 2015-09-23 NOTE — Telephone Encounter (Signed)
Can we check on this?

## 2015-09-23 NOTE — Telephone Encounter (Signed)
Called him back- he actually did not have a questions about meds- but needs to be referred for pain management.  He lost the ability to go to [facility] when they stopped taking [employer].  I can see where Joni Reining but in a referral back in August but nothing since then.    Referrals can we look back into this?   Thanks!!

## 2015-09-23 NOTE — Telephone Encounter (Signed)
Patient is calling because he's supposed to get referred for pain management and hasn't heard anything in two months. Please call! [phone]

## 2015-09-23 NOTE — Telephone Encounter (Signed)
Dr. Copland please advise 

## 2015-09-24 NOTE — Telephone Encounter (Signed)
I spoke to a referrals coordinator at [facility] today, and they said it is still in review.  They are working through a large volume of requests and are working through them as fast as they are able.  Once the review process is complete, they will contact the patient for an appointment.  Unfortunately, they were not able to give me a time frame.

## 2015-09-24 NOTE — Telephone Encounter (Signed)
Can we please give him a call with this update about his pain management referral- thanks!!

## 2015-09-25 ENCOUNTER — Telehealth (HOSPITAL_COMMUNITY): Payer: Self-pay | Admitting: *Deleted

## 2015-09-25 DIAGNOSIS — I5023 Acute on chronic systolic (congestive) heart failure: Secondary | ICD-10-CM

## 2015-09-25 MED ORDER — POTASSIUM CHLORIDE CRYS ER 20 MEQ PO TBCR: 40.0000 meq | EXTENDED_RELEASE_TABLET | Freq: Two times a day (BID) | ORAL | Status: DC

## 2015-09-25 NOTE — Telephone Encounter (Signed)
Notes Recorded by Noralee Space, RN on 09/25/2015 at 4:44 PM Reviewed w/Erika, pharm D she advised pt to increase KCL to 40 meq Twice daily, pt aware and agreeable

## 2015-09-29 ENCOUNTER — Encounter (HOSPITAL_COMMUNITY): Payer: Self-pay | Admitting: Internal Medicine

## 2015-09-29 ENCOUNTER — Ambulatory Visit (INDEPENDENT_AMBULATORY_CARE_PROVIDER_SITE_OTHER): Payer: Commercial Managed Care - HMO | Admitting: Pulmonary Disease

## 2015-09-29 ENCOUNTER — Ambulatory Visit (HOSPITAL_COMMUNITY)
Admission: RE | Admit: 2015-09-29 | Discharge: 2015-09-29 | Disposition: A | Discharge status: Home / Self Care | Payer: Commercial Managed Care - HMO | Source: Ambulatory Visit | Attending: Internal Medicine | Admitting: Internal Medicine

## 2015-09-29 ENCOUNTER — Encounter: Payer: Self-pay | Admitting: Pulmonary Disease

## 2015-09-29 VITALS — BP 112/68 | HR 100 | Temp 98.2°F | Ht 69.0 in | Wt 225.0 lb

## 2015-09-29 VITALS — BP 112/64 | HR 90 | Wt 221.5 lb

## 2015-09-29 DIAGNOSIS — I5023 Acute on chronic systolic (congestive) heart failure: Secondary | ICD-10-CM

## 2015-09-29 DIAGNOSIS — M1030 Gout due to renal impairment, unspecified site: Secondary | ICD-10-CM

## 2015-09-29 DIAGNOSIS — I429 Cardiomyopathy, unspecified: Secondary | ICD-10-CM | POA: Diagnosis not present

## 2015-09-29 DIAGNOSIS — I480 Paroxysmal atrial fibrillation: Secondary | ICD-10-CM

## 2015-09-29 DIAGNOSIS — J449 Chronic obstructive pulmonary disease, unspecified: Secondary | ICD-10-CM | POA: Insufficient documentation

## 2015-09-29 DIAGNOSIS — M109 Gout, unspecified: Secondary | ICD-10-CM | POA: Diagnosis not present

## 2015-09-29 DIAGNOSIS — N184 Chronic kidney disease, stage 4 (severe): Secondary | ICD-10-CM | POA: Insufficient documentation

## 2015-09-29 DIAGNOSIS — G894 Chronic pain syndrome: Secondary | ICD-10-CM

## 2015-09-29 DIAGNOSIS — G629 Polyneuropathy, unspecified: Secondary | ICD-10-CM

## 2015-09-29 DIAGNOSIS — I428 Other cardiomyopathies: Secondary | ICD-10-CM

## 2015-09-29 DIAGNOSIS — E118 Type 2 diabetes mellitus with unspecified complications: Secondary | ICD-10-CM

## 2015-09-29 DIAGNOSIS — J438 Other emphysema: Secondary | ICD-10-CM

## 2015-09-29 DIAGNOSIS — I5022 Chronic systolic (congestive) heart failure: Secondary | ICD-10-CM

## 2015-09-29 DIAGNOSIS — I639 Cerebral infarction, unspecified: Secondary | ICD-10-CM

## 2015-09-29 LAB — BASIC METABOLIC PANEL
ANION GAP: 13 (ref 5–15)
BUN: 70 mg/dL — ABNORMAL HIGH (ref 6–20)
CALCIUM: 9.9 mg/dL (ref 8.9–10.3)
CHLORIDE: 95 mmol/L — AB (ref 101–111)
CO2: 30 mmol/L (ref 22–32)
Creatinine, Ser: 2.45 mg/dL — ABNORMAL HIGH (ref 0.61–1.24)
GFR calc Af Amer: 30 mL/min — ABNORMAL LOW (ref >60)
GFR calc non Af Amer: 26 mL/min — ABNORMAL LOW (ref >60)
GLUCOSE: 69 mg/dL (ref 65–99)
POTASSIUM: 3.3 mmol/L — AB (ref 3.5–5.1)
Sodium: 138 mmol/L (ref 135–145)

## 2015-09-29 LAB — BRAIN NATRIURETIC PEPTIDE: B Natriuretic Peptide: 550.7 pg/mL — ABNORMAL HIGH (ref 0.0–100.0)

## 2015-09-29 MED ORDER — METOLAZONE 5 MG PO TABS: 5.0000 mg | ORAL_TABLET | ORAL | Status: DC

## 2015-09-29 MED ORDER — TIOTROPIUM BROMIDE MONOHYDRATE 18 MCG IN CAPS: 18.0000 ug | ORAL_CAPSULE | Freq: Every day | RESPIRATORY_TRACT | Status: AC

## 2015-09-29 MED ORDER — METOLAZONE 2.5 MG PO TABS: 2.5000 mg | ORAL_TABLET | ORAL | Status: DC

## 2015-09-29 MED ORDER — FLUTICASONE-SALMETEROL 250-50 MCG/DOSE IN AEPB: 1.0000 | INHALATION_SPRAY | Freq: Two times a day (BID) | RESPIRATORY_TRACT | Status: DC

## 2015-09-29 NOTE — Progress Notes (Signed)
Subjective:     Patient ID: Frank Davidson, male   DOB: 1951-01-29, 64 y.o.   MRN: [id number]  HPI ~  September 29, 2015:  Initial pulmonary consultation by SN>  His PCP is Dr. Shanda Bumps Copland, [facility]...    Frank Davidson is a 64 y/o gentleman w/ hx of COPD, HBP, non-ischemic cardiomyopathy w/ CHF, PAF, DM2, neuropathy, obesity, renal insuffic w/ Cr~2.8, hx stroke, memory loss, chronic pain syndrome, etc;  He is referred by the CHF clinic for a pulmonary evaluation;  His medical hx from 2014-2016 is nicely outlined in the CHF clinic notes (last 09/22/15) indicating 2DEcho EF was 15% in 2014 & improved to 25-30% by 1-/16...     He presents c/o incr SOB w/ wheezing "over the past 3 weeks" he says; notes his PCP diagnosed asthma & COPD, but he feels that it is weather related; he gives a disjointed hx of SOB in "attacks- when I try to move around and when I try to sleep"; symptoms are intermittent, off & on, "sometimes I have no prob at all", he denies cough, sputum, hemoptysis, but often notes wheezing; when pressed for a description of his dyspnea- he notes it's more like he can't get the air "IN", not getting enough air, can't walk, etc...   Smoking Hx>  He is a former smoker, started in his 26s, smoked for 35 yrs but only up to 1/2 ppd he says (never got to 1ppd), quit for good in 2009 because "I got tired of it" he says...   Pulmonary Hx>  He says he's been told of asthma & COPD; ?dx w/ [facility] in [place] but they didn't rec CPAP (DrCopland has sched a Sleep Consult w/ DrDohmeier soon)...  Medical Hx>  HBP, non-ischemic cardiomyopathy w/ CHF, PAF, DM2, neuropathy, obesity, renal insuffic w/ Cr~2.8, hx stroke, memory loss, chr pain syndrome, etc; note hx of drug abuse in past.  Family Hx>  Positive for lung dis in mother who was a smoker & died at 12 of lung cancer; he has a brother w/ Emphysema who was in [place]  Occup Hx>  Disabled   Current Meds>  Albuterol HFA prn and Spiriva recently started (pt  not using)...   EXAM shows Afeb, VSS, O2sat=93% on RA; wt=225#, 5'9"Tall, BMI=33; HEENT- neg, mallampati2;  Chest- decr BS at bases, otherw clear;  Heart- RR w/o m/r/g;  Abd- obese, soft, neg;  Ext- 1+edema w/o c/c; Neuro- no focal deficits...  CXR 08/27/15 showed cardiomegaly, sl incr markings/ scarring in mid-zones,   Full PFT 08/13/15 showed FVC=2.14 (61%), FEV1=1.37 (51%), %1sec=64, mid-flows reduced at 29% pred; there was an 8% improvement in FEV1 after bronchodil; TLC=4.73 (73%), RV=2/16 (98%), RV/TLC=46; DLCO=48% predicted.  Ambulatory oxygen saturation test 09/29/15 on RA> O2sat=94% at rest w/ pulse=83/min; he ambulated only 1 lap w/ lowest O2sat=90% w/ pulse=110/min  LABS 08/2015>  K=3.1, BUN=60, Cr=2.08, BS=115, BNP=501, Hg=11.9, MCV=84,   IMP/PLAN>>  Frank Davidson has mod airflow obstruction and GOLD Stage 2-3 COPD;  He would benefit from combined bronchodil Rx w/ ADVAIR250-Bid & SPIRIVA once daily;  He should continue his AlbutHFA rescue inhaler as needed;  I feel that his cardiac problems/ CHF/ periph vasc dis/ prev stroke/ renal insuffic are more pressing issues for him;  I encouraged wt reduction and incr exercise as tol... We plan ROV in 6 weeks.    Past Medical History  Diagnosis Date  . Atrial fibrillation (HCC) >> on Eliquis, Coreg     PAF  . Hypertension >>  on Imdur, Coreg, Hydralazine, Demadex, Zaroxyln, Potassium   . COPD (chronic obstructive pulmonary disease) (HCC) >> on ProventilHFA prn   . Ocular herpes   . Gout   . CHF (congestive heart failure) >> on Coreg, Hydralazine, Demadex, Zaroxyln, Potassium   . Chronic pain   . Hyperlipidemia   . Nonischemic cardiomyopathy (HCC) Aug 2015    EF 15% ==> 25-30%   . Ocular herpes   . Neuropathy (HCC)   . Left-sided weakness     "because of the arthritis and edema"  . PVD (peripheral vascular disease) [facility]) May 2015    abnormal ABIs  . Memory impairment     "short term"  . CKD (chronic kidney disease), stage III >> he required  transient CVVH in hosp...     Dr. Briant Cedar follows-holding   . Fibromyalgia >> chronic pain syndrome on Diclofenac, Percocet, Lioresal...     neuropathy- hands, more than feet.  . Sleep apnea with prev use CPAP >> Sleep eval sched w/ DrDohmeier     does not use cpap  . GERD (gastroesophageal reflux disease)     tums as needed  . Asthma   . Type II diabetes mellitus (HCC) >> on Glipizide   . Stroke [facility]) X 5    last stroke aug 2014"; denies residual on 09/12/2014  . Osteoarthritis >> on Allopurinol for gout   . Arthritis     "knees, right elbow, shoulder" (09/12/2014)  . Shortness of breath dyspnea     Past Surgical History  Procedure Laterality Date  . 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 Tonsillectomy  age 14  . Esophagogastroduodenoscopy N/A 12/24/2013    Procedure: ESOPHAGOGASTRODUODENOSCOPY (EGD);  Surgeon: Louis Meckel, MD;  Location: [facility];  Service: Endoscopy;  Laterality: N/A;  . Colonoscopy N/A 12/24/2013    Procedure: COLONOSCOPY;  Surgeon: Louis Meckel, MD;  Location: [facility] ENDOSCOPY;  Service: Endoscopy;  Laterality: N/A;  . Joint replacement    . Cardiac catheterization  10/2006    normal coronary arteries;   Marland Kitchen Cataract extraction w/ intraocular lens  implant, bilateral Bilateral ~ 2008  . Total hip arthroplasty Left 09/12/2014    Procedure: LEFT TOTAL HIP ARTHROPLASTY ANTERIOR APPROACH;  Surgeon: [name], MD;  Location: [facility];  Service: Orthopedics;  Laterality: Left;  . Right heart catheterization N/A 08/13/2014    Rt ht cath prior to hip surgery    Outpatient Encounter Prescriptions as of 09/29/2015  Medication Sig  . albuterol (PROVENTIL HFA;VENTOLIN HFA) 108 (90 BASE) MCG/ACT inhaler Inhale 2 puffs into the lungs every 6 (six) hours as needed for wheezing or shortness of breath. (Patient not taking: Reported on 09/29/2015)  . allopurinol (ZYLOPRIM) 100 MG tablet Take 100 mg by mouth daily.  Marland Kitchen apixaban (ELIQUIS) 5 MG TABS tablet Take 1 tablet (5 mg  total) by mouth 2 (two) times daily.  . baclofen (LIORESAL) 10 MG tablet TAKE 1 TABLET EVERY 8 HOURS AS NEEDED FOR MUSCLE SPASM(S)  . carvedilol (COREG) 25 MG tablet Take 12.5 mg by mouth 2 (two) times daily with a meal.  . diclofenac (FLECTOR) 1.3 % PTCH Place 1 patch onto the skin 2 (two) times daily as needed (pain).  Marland Kitchen docusate sodium 100 MG CAPS Take 100 mg by mouth 2 (two) times daily.  . hydrALAZINE (APRESOLINE) 50 MG tablet Take 1 tablet (50 mg total) by mouth 3 (three) times daily.  . isosorbide mononitrate (IMDUR) 60 MG 24 hr tablet  Take 60 mg by mouth daily.  Marland Kitchen omega-3 acid ethyl esters (LOVAZA) 1 G capsule Take 1 g by mouth daily.  Marland Kitchen oxyCODONE-acetaminophen (PERCOCET) 10-325 MG tablet Take 1 tablet by mouth 4 (four) times daily. Scheduled. Use with stool softerner. Refill on 09/18/2015  . polyethylene glycol powder (GLYCOLAX/MIRALAX) powder MIX 1 CAPFUL (17GM)  IN  LIQUID  AND  DRINK EVERY DAY AS DIRECTED  . torsemide (DEMADEX) 20 MG tablet Take 4 tablets (80 mg total) by mouth 2 (two) times daily.  Marland Kitchen GLIPIZIDE PO Take by mouth.  .  tiotropium (SPIRIVA HANDIHALER) 18 MCG inhalation capsule Place 1 capsule (18 mcg total) into inhaler and inhale daily. (Patient not taking: Reported on 09/29/2015)    Allergies  Allergen Reactions  . Corlanor [Ivabradine] Palpitations and Other (See Comments)    Kidney and heart problems, syncope  . Entresto [Sacubitril-Valsartan]     Syncope  . Januvia [Sitagliptin] Other (See Comments)    "Almost killed me"   . Lunesta [Eszopiclone] Palpitations and Other (See Comments)    Kidney and heart problems, syncope  . Actos [Pioglitazone] Other (See Comments)    Caused blood in urine and fluid retention     Immunization History  Administered Date(s) Administered  . Influenza,inj,Quad PF,36+ Mos 09/05/2013, 09/03/2015  . Influenza-Unspecified 08/29/2014  . Pneumococcal-Unspecified 07/23/2013    Family History  Problem Relation Age of Onset  .  Cancer Mother   . Hypertension Mother   . Lung disease Mother   . Diabetes Father   . Diabetes Sister   . Hypertension Sister   . Hypertension Brother   . Hypertension Sister   . Colon cancer Neg Hx     Social History   Social History  . Marital Status: Divorced    Spouse Name: N/A  . Number of Children: N/A  . Years of Education: N/A   Occupational History  . Not on file.   Social History Main Topics  . Smoking status: Former Smoker -- 0.50 packs/day for 13 years    Types: Cigarettes    Quit date: 12/18/2006  . Smokeless tobacco: Never Used  . Alcohol Use: No     Comment: Quit 2008  . Drug Use: Yes    Special: Cocaine, Marijuana     Comment: last used marijuana and cocaine 2008  . Sexual Activity: Not on file   Other Topics Concern  . Not on file   Social History Narrative    Current Medications, Allergies, Past Medical History, Past Surgical History, Family History, and Social History were reviewed in [employer] recor   Review of Systems             All symptoms NEG except where BOLDED >>  Constitutional:  F/C/S, fatigue, anorexia, unexpected weight change. HEENT:  HA, visual changes, hearing loss, earache, nasal symptoms, sore throat, mouth sores, hoarseness. Resp:  cough, sputum, hemoptysis; SOB, tightness, wheezing. Cardio:  CP, palpit, DOE, orthopnea, edema. GI:  N/V/D/C, blood in stool; reflux, abd pain, distention, gas. GU:  dysuria, freq, urgency, hematuria, flank pain, voiding difficulty. MS:  joint pain, swelling, tenderness, decr ROM; neck pain, back pain, etc. Neuro:  HA, tremors, seizures, dizziness, syncope, weakness, numbness, gait abn. Skin:  suspicious lesions or skin rash. Heme:  adenopathy, bruising, bleeding. Psyche:  confusion, agitation, sleep disturbance, hallucinations, anxiety, depression suicidal.   Objective:   Physical Exam       Vital Signs:  Reviewed...  General:  WD, obese, 64 y/o BM  in NAD; alert &  oriented; pleasant & cooperative... HEENT:  [place]/AT; Conjunctiva- pink, Sclera- nonicteric, EOM-wnl, PERRLA, EACs-clear, TMs-wnl; NOSE-clear; THROAT-clear & wnl. Neck:  Supple w/ fair ROM; no JVD; normal carotid impulses w/o bruits; no thyromegaly or nodules palpated; no lymphadenopathy. Chest:  Decr BS at bases, clear to P & A; without wheezes, rales, or rhonchi heard. Heart:  Regular Rhythm; without murmurs, rubs, or gallops detected. Abdomen:  Soft & nontender- no guarding or rebound; normal bowel sounds; no organomegaly or masses palpated. Ext:  decrROM; without deformities, mild arthritic changes; no varicose veins, +venous insuffic,1+edema;  Pulses intact w/o bruits. Neuro:  No resid neuro deficits but +gait abn... Derm:  No lesions noted; no rash etc. Lymph:  No cervical, supraclavicular, axillary, or inguinal adenopathy palpated.   Assessment:      IMP >>     Mod airflow obstruction w/ GOLD Stage 2-3 COPD > we discussed Rx w/ ADVAIR250Bid & Spiriva daily, continue the AlbutHFA rescue inhaler prn...    Ex-smoker> he quit in 2009    HBP    Non-ischemic cardiomyopathy w/ CHF    PAF    DM2, neuropathy    Obesity    Renal insuffic w/ Cr~2.0-2.8    Hx stroke, memory loss    Chronic pain syndrome  PLAN >>     Frank Davidson has mod airflow obstruction and GOLD Stage 2-3 COPD;  He would benefit from combined bronchodil Rx w/ ADVAIR250-Bid & SPIRIVA once daily;  He should continue his AlbutHFA rescue inhaler as needed;  I feel that his cardiac problems/ CHF/ periph vasc dis/ prev stroke/ renal insuffic are more pressing issues for him;  I encouraged wt reduction and incr exercise as tolerated...     Plan:     Patient's Medications  New Prescriptions   FLUTICASONE-SALMETEROL (ADVAIR DISKUS) 250-50 MCG/DOSE AEPB    Inhale 1 puff into the lungs 2 (two) times daily.   METOLAZONE (ZAROXOLYN) 2.5 MG TABLET    Take 1 tablet (2.5 mg total) by mouth 2 (two) times a week. Tuesday and Friday  Previous  Medications   ALBUTEROL (PROVENTIL HFA;VENTOLIN HFA) 108 (90 BASE) MCG/ACT INHALER    Inhale 2 puffs into the lungs every 6 (six) hours as needed for wheezing or shortness of breath.   ALLOPURINOL (ZYLOPRIM) 100 MG TABLET    Take 100 mg by mouth daily.   APIXABAN (ELIQUIS) 5 MG TABS TABLET    Take 1 tablet (5 mg total) by mouth 2 (two) times daily.   BACLOFEN (LIORESAL) 10 MG TABLET    TAKE 1 TABLET EVERY 8 HOURS AS NEEDED FOR MUSCLE SPASM(S)   CARVEDILOL (COREG) 25 MG TABLET    Take 12.5 mg by mouth 2 (two) times daily with a meal.   DICLOFENAC (FLECTOR) 1.3 % PTCH    Place 1 patch onto the skin 2 (two) times daily as needed (pain).   DOCUSATE SODIUM 100 MG CAPS    Take 100 mg by mouth 2 (two) times daily.   GLIPIZIDE (GLUCOTROL) 5 MG TABLET    Take 5 mg by mouth 2 (two) times daily before a meal.   HYDRALAZINE (APRESOLINE) 50 MG TABLET    Take 1 tablet (50 mg total) by mouth 3 (three) times daily.   ISOSORBIDE MONONITRATE (IMDUR) 60 MG 24 HR TABLET    Take 60 mg by mouth daily.   OMEGA-3 ACID ETHYL ESTERS (LOVAZA) 1 G CAPSULE    Take 1 g by mouth daily.   OXYCODONE-ACETAMINOPHEN (  PERCOCET) 10-325 MG TABLET    Take 1 tablet by mouth 4 (four) times daily. Scheduled. Use with stool softerner. Refill on 09/18/2015   POLYETHYLENE GLYCOL POWDER (GLYCOLAX/MIRALAX) POWDER    MIX 1 CAPFUL (17GM)  IN  LIQUID  AND  DRINK EVERY DAY AS DIRECTED   POTASSIUM CHLORIDE SA (K-DUR,KLOR-CON) 20 MEQ TABLET    Take 20 mEq by mouth 2 (two) times daily.   TORSEMIDE (DEMADEX) 20 MG TABLET    Take 4 tablets (80 mg total) by mouth 2 (two) times daily.  Modified Medications   Modified Medication Previous Medication   TIOTROPIUM (SPIRIVA HANDIHALER) 18 MCG INHALATION CAPSULE tiotropium (SPIRIVA HANDIHALER) 18 MCG inhalation capsule      Place 1 capsule (18 mcg total) into inhaler and inhale daily.    Place 1 capsule (18 mcg total) into inhaler and inhale daily.  Discontinued Medications   No medications on file

## 2015-09-29 NOTE — Patient Instructions (Signed)
Audie-- it was nice meeting you today...  You have some underlying COPD & we are going to try to help your breathing & prevent deterioration by starting 2 inhalers...  Start the new [id number]- one inhalation twice daily...  Start the new SPIRIVA by inhaling the contents of 1 capsule via the handihaler once daily...  You may still use your AlbuterolHFA rescue inhaler as needed...  Work on weight reduction as this will also help your breathing...  Remain as active as possible...  Let's plan a follow up visit in 6-8 weeks for a recheck.Marland KitchenMarland Kitchen

## 2015-09-29 NOTE — Patient Instructions (Addendum)
Medications:  Metolazone 2.5 mg on Tuesday and Friday  Extra as needed for 3 pounds weight gain   Labs today will call if abnormal   Follow up in 1 month

## 2015-09-29 NOTE — Progress Notes (Signed)
[facility] CLINIC NOTE  Patient ID: Frank Davidson, male   DOB: 04-Nov-1951, 64 y.o.   MRN: [id number] PCP: Dr. Arthor Captain Nephrologist: Dr. Briant Cedar HF: Bensimhon  HPI: Frank Davidson is a 64 y.o. male with a hx of obesity, PAF, HTN, COPD, HTN, gout, DM 2, CVA and CHF EF 25% due to NICM. Intolerant Entresto at the lowest dose due to syncope.   Has previously followed in the [facility] in [place]. Previous cath in 2007 showed NICM with normal cors. Was living alone in [place] and apparently had issues managing medications by himself and was getting confused with PCP and cardiologists medications and was not taking his meds correctly. He moved to [place] to be near his nieces. Issues in the past with ACE-I due to renal failure. Highest creatinine in our system is ~1.5 but was apparently higher previously - thought to be related to taking his medications incorrectly.   Was admitted in 06/2013 from [facility] with left sided weakness and was found to have a moderate to large right hemispheric infarct. Went back to [facility].   Admitted to [facility] 10/14 through 09/09/13 with ADHF. Diuresed 30 pounds with IV lasix and transitioned to lasix 80 mg po bid. Continued on 25 mg carvedilol bid, isordil 20 mg daily.  Started on lisinopril 2.5 mg daily. Discharge weight 229 pounds. 09/29/13 CT abdomen and pelvis - no obstruction . Diverticulosis. No inflammatory changes.   Admitted 11/23/13 through 11/26/13 with volume overload and dyspnea. Had worsening cardiorenal syndrome. Nephrology consulted and managed with IV lasix and transitioned to torsemide 40 mg twice a day.  Diuresed 25 pounds. Discharge weight 232 pounds. Hydralazine, Spiro, and Lisinopril stopped at discharge. He is also off digoxin.    Admitted 07/01/15 for AKI after presumed over diuresis. His Cr on admit was 4.6., though his baseline is 2.  We followed him in the hospital and held diuresis for several days.  Cr on discharge was 2.36.  We decreased his spiro to 12.5 mg, and his diuretic regimen was continued at 40 mg torsemide BID.  Discharge weight was 229.  Admitted in 10/16 with acute on chronic systolic CHF and AKI with creatinine up to 7.1 => ATN.  Suspected cardiorenal syndrome, but surprisingly co-ox was normal.  CVVH was required but renal function gradually improved. D/c weight 212 pounds.  He returns for followup today.  At last visit we continued his increased torsemide at 60 mg BID and his metolazone twice weekly due to volume overload.  We also stopped his losartan and spiro with creatinine at 2.8. He is down 13 lbs from last visit. Weight at home 218-221 . Breathing is a lot better at home with activity now. Recently had a 5 mg prescription of metolazone pills filled (supposed to take 0.5 tablet) and was confused over size difference.  Now understands. Encouraged to bring medications to next visit. Sees Nephrology on 10/21/15.  PFTs (9/16): Severe obstruction.    ECHO 11/08/13 EF 15%  ECHO 07/05/14: EF 15%  ECHO 11/26/2014: EF 20%  ECHO 10/16: EF 25-30%, mild LV dilation, diffuse hypokinesis, mild MR, mild RV dilation  08/13/14 RA = 7  RV = 50/3/10  PA = 54/22 (34)  PCW = 16  Fick cardiac output/index = 6.9/3.0  Thermo CO/CI = 4.7/2.1  PVR = 4.1 WU  Ao sat = 94%  PA sat = 68%,68%  SVC sat (sheath) = 62%  Labs 08/22/14 K 4.4 creatinine 1.6  09/02/14 Creatinine 1.6 10/20/14:  creatinine 2.58, K 5.1 11/23/2014: Creatinine 4.12 --Ace and Spiro stopped 11/24/2014: Creatinine 3.55 1/4/12016: 2.49  11/26/2014: Hgb 10.4 Hct 31.7 Creatinine 1.94  12/03/14: K 4, creatinine 2.25 02/19/2015: K 3.8 Creatinine 2.66  05/05/2015: K 3.4 Creatinine 2.68   8/16: K 3.5 Creatinine 2.36 => 2.84, hgb 10.4, BNP 523 10/16: K 3.7, creatinine 2.57 => 2.6 => 2.8 09/12/15: K 3.7, creatinine 2.82, BUN 79 09/15/15: K 3.8, creatinine 2.84, BUN 77  SH: Retired Engineer, site 2011 Lives with his niece  FH: Father DM  Mom had lung  cancer  ROS: All systems negative except as listed in HPI, PMH and Problem List.  Past Medical History  Diagnosis Date  . Atrial fibrillation (HCC)     PAF  . Hypertension   . COPD (chronic obstructive pulmonary disease) (HCC)   . Ocular herpes   . Gout   . CHF (congestive heart failure) (HCC)   . Chronic pain   . Hyperlipidemia   . Nonischemic cardiomyopathy (HCC) Aug 2015    EF 15%  . Ocular herpes   . Neuropathy (HCC)   . Left-sided weakness     "because of the arthritis and edema"  . PVD (peripheral vascular disease) [facility]) May 2015    abnormal ABIs  . Memory impairment     "short term"  . CKD (chronic kidney disease), stage III     Dr. Briant Cedar follows-holding   . Fibromyalgia     neuropathy- hands, more than feet.  . Sleep apnea with use of continuous positive airway pressure (CPAP)     does not use cpap  . GERD (gastroesophageal reflux disease)     tums as needed  . Asthma   . Type II diabetes mellitus (HCC)   . Stroke [facility]) X 5    last stroke aug 2014"; denies residual on 09/12/2014  . Osteoarthritis   . Arthritis     "knees, right elbow, shoulder" (09/12/2014)  . Shortness of breath dyspnea     Current Outpatient Prescriptions  Medication Sig Dispense Refill  . albuterol (PROVENTIL HFA;VENTOLIN HFA) 108 (90 BASE) MCG/ACT inhaler Inhale 2 puffs into the lungs every 6 (six) hours as needed for wheezing or shortness of breath. 1 Inhaler 1  . allopurinol (ZYLOPRIM) 100 MG tablet Take 100 mg by mouth daily.    Marland Kitchen apixaban (ELIQUIS) 5 MG TABS tablet Take 1 tablet (5 mg total) by mouth 2 (two) times daily. 180 tablet 3  . baclofen (LIORESAL) 10 MG tablet TAKE 1 TABLET EVERY 8 HOURS AS NEEDED FOR MUSCLE SPASM(S) 90 tablet 1  . carvedilol (COREG) 25 MG tablet Take 12.5 mg by mouth 2 (two) times daily with a meal.    . diclofenac (FLECTOR) 1.3 % PTCH Place 1 patch onto the skin 2 (two) times daily as needed (pain).    Marland Kitchen docusate sodium 100 MG CAPS Take 100 mg by mouth  2 (two) times daily. 10 capsule 0  . Fluticasone-Salmeterol (ADVAIR DISKUS) 250-50 MCG/DOSE AEPB Inhale 1 puff into the lungs 2 (two) times daily. 120 each 1  . GLIPIZIDE PO Take by mouth.    . hydrALAZINE (APRESOLINE) 50 MG tablet Take 1 tablet (50 mg total) by mouth 3 (three) times daily. 90 tablet 3  . isosorbide mononitrate (IMDUR) 60 MG 24 hr tablet Take 60 mg by mouth daily.    . metolazone (ZAROXOLYN) 2.5 MG tablet Take 1 tablet (2.5 mg total) by mouth as directed. 20 tablet 2  . metolazone (ZAROXOLYN) 5  MG tablet Take 2.5mg  (1/2) as needed for swelling. (Patient not taking: Reported on 09/29/2015) 15 tablet 2  . omega-3 acid ethyl esters (LOVAZA) 1 G capsule Take 1 g by mouth daily.    Marland Kitchen oxyCODONE-acetaminophen (PERCOCET) 10-325 MG tablet Take 1 tablet by mouth 4 (four) times daily. Scheduled. Use with stool softerner. Refill on 09/18/2015 60 tablet 0  . polyethylene glycol powder (GLYCOLAX/MIRALAX) powder MIX 1 CAPFUL (17GM)  IN  LIQUID  AND  DRINK EVERY DAY AS DIRECTED 1530 g 3  . potassium chloride SA (K-DUR,KLOR-CON) 20 MEQ tablet Take 2 tablets (40 mEq total) by mouth 2 (two) times daily. 60 tablet 6  . tiotropium (SPIRIVA HANDIHALER) 18 MCG inhalation capsule Place 1 capsule (18 mcg total) into inhaler and inhale daily. 90 capsule 2  . torsemide (DEMADEX) 20 MG tablet Take 4 tablets (80 mg total) by mouth 2 (two) times daily. 240 tablet 3   No current facility-administered medications for this encounter.    Filed Vitals:   09/29/15 1346  BP: 112/64  Pulse: 90  Weight: 221 lb 8 oz (100.472 kg)  SpO2: 98%     Wt Readings from Last 3 Encounters:  09/29/15 221 lb 8 oz (100.472 kg)  09/29/15 225 lb (102.059 kg)  09/22/15 234 lb 12.8 oz (106.505 kg)    PHYSICAL EXAM: General:  Obese male; no resp difficutly HEENT: normal Neck: supple. JVP 8 cm. Carotids 2+ bilaterally; no bruits. No thryomegaly noted. Cor: PMI nonpalpable. RRR. No M/G/R appreciated Lungs: CTA bilaterally,  normal effort. Abdomen: obese. soft, NT, Mildly distended. No HSM. No bruits or masses. +BS Extremities: no cyanosis, clubbing, rash.  Trac -1+ edema to knees bilaterally. Compression stockings in place.   Neuro: alert & orientedx3, cranial nerves grossly intact. Affect pleasant.  ASSESSMENT & PLAN:  1) Chronic Systolic Heart Failure: Nonischemic cardiomyopathy, EF 25-30% in 10/16. NYHA III symptoms.  He has refused ICD.  Volume status much improved. Difficulty balancing diuresis/fluid removal with his renal dysfunction. - Continue torsemide 60 mg BID and metolazone 2.5 mg 30 minutes before am torsemide on Tuesdays and Fridays. Will adjust K based on BMEt today - Can take an extra metolazone as needed (max of 3 total per week). Will get BMET today - Continue coreg 12.5 mg BID.  - Continue Imdur + hydralazine 50 mg tid.    - Off digoxin due to CKD.  - He remains firm in his decision to not pursue ICD.  - Reinforced the need and importance of daily weights, a low sodium diet and to avoid soups, and fluid restriction (less than 2 L a day). Instructed to call the HF clinic if weight increases more than 3 lbs overnight or 5 lbs in a week.  2) Gout: Continue allopurinol. Follow with rheumatology.  3) Atrial fibrillation: Paroxysmal. Continue Eliquis.  4) CKD stage IV:  - Creatinine improved at last check. Will recheck today. - Follows with nephrology, next appt 10/21/15. Could not get sooner - Cardiorenal syndrome has been progressive and makes volume management more difficult.  He may progress to need HD for volume removal in the future.  5) COPD:  Severe obstruction on PFTs.  He has had documented exertional hypoxemia but has refused home oxygen.  Prior smoker. - Seen by Dr Kriste Basque this am. Started [phone] and spiriva with albuterol prn. 6) Suspected OSA: Sleep study scheduled.   Graciella Freer PA-C 09/29/2015   Patient seen and examined with Otilio Saber, PA-C. We discussed all  aspects of the encounter. I agree with the assessment and plan as stated above.   Volume status much improved on current regimen. Long talk about use of sliding scale diuretics and salt restriction. Will check labs today. Continue Eliquis for AF.   Bensimhon, Daniel,MD 10:03 AM

## 2015-09-29 NOTE — Progress Notes (Signed)
Advanced Heart Failure Medication Review by a Pharmacist  Does the patient  feel that his/her medications are working for him/her?  yes  Has the patient been experiencing any side effects to the medications prescribed?  no  Does the patient measure his/her own blood pressure or blood glucose at home?  yes   Does the patient have any problems obtaining medications due to transportation or finances?   no  Understanding of regimen: good Understanding of indications: good Potential of compliance: good Patient understands to avoid NSAIDs. Patient understands to avoid decongestants.  Issues to address at subsequent visits: Compliance   Pharmacist comments:  Mr. Sarwar is a pleasant 64 yo M presenting without a medication list but able to verbalize most medications to me excluding dosages. He reports good compliance with his medications. There was some confusion about his metolazone dose. I verified with [employer] that they filled metolazone 5 mg tablets with directions to take 1/2 tab as directed on 11/5 and [facility] filled metolazone 2.5 mg tablets with directions to take 1 tab as directed on 10/24. Patient states that he has been taking a whole 2.5 mg tablet on Tues and Fri and just received the [facility] Rx (5 mg tablets) and mixed it with his 2.5 mg tablets. He was told to take either a whole 2.5 mg tablet or 1/2 of the bigger 5 mg tablets as directed. He also stated that he has been taking 20 meq of K BID instead of 40 meq BID. I did reinforce the importance of bringing his medication bottles with him to each clinic visit from here on out. He did not have any other medication-related questions or concerns for me at this time.   Tyler Deis. Bonnye Fava, PharmD, BCPS, CPP Clinical Pharmacist Pager: [phone] Phone: [phone] 09/29/2015 2:21 PM      Time with patient: 8 minutes Preparation and documentation time: 10 minutes Total time: 18 minutes

## 2015-09-30 NOTE — Patient Outreach (Signed)
[facility]) Care Management  09/30/2015  Frank Davidson 03-04-1951 [id number]   Notification from Raina Mina, RN to close case due to goals met with [place] Management.  Thanks, Ronnell Freshwater. [place], [place] Assistant Phone: [phone] Fax: [phone]

## 2015-10-02 ENCOUNTER — Telehealth: Payer: Self-pay

## 2015-10-02 ENCOUNTER — Institutional Professional Consult (permissible substitution): Payer: Commercial Managed Care - HMO | Admitting: Neurology

## 2015-10-02 DIAGNOSIS — G894 Chronic pain syndrome: Secondary | ICD-10-CM

## 2015-10-02 NOTE — Telephone Encounter (Signed)
PT extension for 2 times a week for four weeks .  [phone]   Dr. Conley Rolls

## 2015-10-02 NOTE — Telephone Encounter (Signed)
The patient called to request a refill of oxyCODONE-acetaminophen (PERCOCET) 10-325 MG tablet.  He will be out of the medication in two days.  His pain management referral is still in review, so he would like a refill until his pain management referral is scheduled.  Please advise, thank you.  CB#: [phone]

## 2015-10-02 NOTE — Telephone Encounter (Signed)
Called and LMOM- I will have this for him to pick up tomorrow

## 2015-10-03 MED ORDER — OXYCODONE-ACETAMINOPHEN 10-325 MG PO TABS: 1.0000 | ORAL_TABLET | Freq: Four times a day (QID) | ORAL | Status: DC

## 2015-10-06 ENCOUNTER — Ambulatory Visit (INDEPENDENT_AMBULATORY_CARE_PROVIDER_SITE_OTHER): Payer: Commercial Managed Care - HMO | Admitting: Neurology

## 2015-10-06 ENCOUNTER — Encounter: Payer: Self-pay | Admitting: Neurology

## 2015-10-06 VITALS — BP 118/78 | HR 82 | Resp 20 | Ht 70.0 in | Wt 224.0 lb

## 2015-10-06 DIAGNOSIS — I480 Paroxysmal atrial fibrillation: Secondary | ICD-10-CM

## 2015-10-06 DIAGNOSIS — G4730 Sleep apnea, unspecified: Secondary | ICD-10-CM | POA: Diagnosis not present

## 2015-10-06 DIAGNOSIS — G4710 Hypersomnia, unspecified: Secondary | ICD-10-CM

## 2015-10-06 DIAGNOSIS — J441 Chronic obstructive pulmonary disease with (acute) exacerbation: Secondary | ICD-10-CM

## 2015-10-06 DIAGNOSIS — I509 Heart failure, unspecified: Secondary | ICD-10-CM | POA: Insufficient documentation

## 2015-10-06 DIAGNOSIS — R0683 Snoring: Secondary | ICD-10-CM | POA: Diagnosis not present

## 2015-10-06 DIAGNOSIS — I5023 Acute on chronic systolic (congestive) heart failure: Secondary | ICD-10-CM | POA: Diagnosis not present

## 2015-10-06 NOTE — Progress Notes (Signed)
SLEEP MEDICINE CLINIC   Provider:  Melvyn Davidson, M D  Referring Provider: Pearline Cables, MD Primary Care Physician:  Frank Amsterdam, MD  Chief Complaint  Patient presents with  . New Patient (Initial Visit)    snoring at night, had sleep study in [place] 3 years ago but no one has records of it, rm 10, alone    HPI:  Frank Davidson is a 64 y.o. male , seen here as a referral  from Dr. Patsy Davidson for a sleep evaluation.   Chief complaint according to patient :" I was tested twice for sleep apnea in [place], [place]. Iwas referred by Dr. Elder Davidson , my cardiologist " - "the Results were inconclusive, due to 4 nocturias. I was on diuretics".   Frank Davidson reports that he has been tested for sleep apnea before but never had a valid outcome of the sleep study.  His studies have been performed in the last 8-5 years.   He has a history of renal disease, congestive heart failure, is a and he believes he has obstructive sleep apnea but does not have a CPAP. He was hospitalized from 10-5 through 10-12 of this year. He had to come in through the emergency room with acute on chronic congestive heart failure had to be placed on diuretics but denies any chest pain or shortness of breath or wheezing. He does not have major leg swelling he wears compression stockings he walks with a cane. He was on hemodialysis until his kidney function rebounded. Frank Davidson is his renal specialist. 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  Sleep habits are as follows: The patient lives with his niece. He does not share his bedroom. He usually  watches TV in the living room before going to the bedroom. The patient usually tries to go to bed between 10 PM and midnight. Usually it will take him about 30 minutes to fall asleep. He wakes every 2-3 hours to go to the bathroom. He has nocturia up to 5 times at night. The patient's bedroom is described as core, quiet and dark. He sleeps on multiple pillows, due to bilateral hip problems he usually sleeps on his back. He falls asleep often on his right side. When he wakes up he is usually in supine position. He has never woken himself snoring or gasping for air. He will get between 6 and 7 hours of nocturnal sleep. He wakes up spontaneously around 7 AM, does not rely on an alarm. He will take a light breakfast, usually juice and oatmeal, ginger ale. He drinks chamomile tea at night to help him fall asleep. No caffeine use. He takes naps in the afternoon if able to rest - 30-40 minutes . Sleep medical history and family sleep history: Patient has no history of sleepwalking or night terrors in childhood. Family history of sleep disorders; everybody "snores". Social history: quit smoking and drinking 2009.  Currently no drugs, ETOH or tobacco use.   Retired Marketing executive.   Review of Systems: Out of a complete 14 system review, the patient complains of only the following symptoms, and all other reviewed systems are negative.  Patient  endorsed the geriatric depression score at 2 points the Epworth sleepiness score at 12 points, and  the fatigue severity score at 18 points.  Snoring, gait disorder, gout, on diuretics with nocturia.   Social History   Social History  . Marital Status: Divorced    Spouse Name: N/A  . Number of Children: N/A  . Years of Education: N/A   Occupational History  . Not on file.   Social History Main Topics  . Smoking status: Former Smoker -- 0.50 packs/day for 13 years    Types: Cigarettes    Quit date: 12/18/2006  . Smokeless tobacco: Never  Used  . Alcohol Use: No     Comment: Quit 2008  . Drug Use: Yes    Special: Cocaine, Marijuana     Comment: last used marijuana and cocaine 2008  . Sexual Activity: Not on file   Other Topics Concern  . Not on file   Social History Narrative    Family History  Problem Relation Age of Onset  . Cancer Mother   . Hypertension Mother   . Lung disease Mother   . Diabetes Father   . Diabetes Sister   . Hypertension Sister   . Hypertension Brother   . Hypertension Sister   . Colon cancer Neg Hx     Past Medical History  Diagnosis Date  . Atrial fibrillation (HCC)     PAF  . Hypertension   . COPD (chronic obstructive pulmonary disease) (HCC)   . Ocular herpes   . Gout   . CHF (congestive heart failure) (HCC)   . Chronic pain   . Hyperlipidemia   . Nonischemic cardiomyopathy (HCC) Aug 2015    EF 15%  . Ocular herpes   . Neuropathy (HCC)   . Left-sided weakness     "because of the arthritis and edema"  . PVD (peripheral vascular disease) [facility]) May 2015    abnormal ABIs  . Memory impairment     "short term"  . CKD (chronic kidney disease), stage III     Frank Davidson follows-holding   . Fibromyalgia     neuropathy- hands, more than feet.  . Sleep apnea with use of continuous positive airway pressure (CPAP)     does not use cpap  . GERD (gastroesophageal reflux disease)     tums as needed  . Asthma   . Type II diabetes mellitus (HCC)   . Stroke [facility]) X 5    last stroke aug 2014"; denies residual on 09/12/2014  . Osteoarthritis   . Arthritis     "knees, right elbow, shoulder" (09/12/2014)  . Shortness of breath dyspnea     Past Surgical History  Procedure Laterality Date  . 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 Tonsillectomy  age 23  . Esophagogastroduodenoscopy N/A 12/24/2013    Procedure: ESOPHAGOGASTRODUODENOSCOPY (EGD);  Surgeon: Louis Meckel, MD;  Location: [facility];  Service: Endoscopy;  Laterality: N/A;  . Colonoscopy N/A 12/24/2013    Procedure:  COLONOSCOPY;  Surgeon: Louis Meckel, MD;  Location: [facility];  Service: Endoscopy;  Laterality: N/A;  . Joint replacement    . Cardiac catheterization  10/2006    normal coronary arteries;   Marland Kitchen Cataract extraction w/ intraocular lens  implant, bilateral Bilateral ~ 2008  . Total hip arthroplasty Left 09/12/2014    Procedure: LEFT TOTAL HIP ARTHROPLASTY ANTERIOR APPROACH;  Surgeon: [name], MD;  Location: [facility];  Service: Orthopedics;  Laterality:  Left;  . Right heart catheterization N/A 08/13/2014    Rt ht cath prior to hip surgery    Current Outpatient Prescriptions  Medication Sig Dispense Refill  . albuterol (PROVENTIL HFA;VENTOLIN HFA) 108 (90 BASE) MCG/ACT inhaler Inhale 2 puffs into the lungs every 6 (six) hours as needed for wheezing or shortness of breath. 1 Inhaler 1  . allopurinol (ZYLOPRIM) 100 MG tablet Take 100 mg by mouth daily.    Marland Kitchen apixaban (ELIQUIS) 5 MG TABS tablet Take 1 tablet (5 mg total) by mouth 2 (two) times daily. 180 tablet 3  . baclofen (LIORESAL) 10 MG tablet TAKE 1 TABLET EVERY 8 HOURS AS NEEDED FOR MUSCLE SPASM(S) 90 tablet 1  . carvedilol (COREG) 25 MG tablet Take 12.5 mg by mouth 2 (two) times daily with a meal.    . diclofenac (FLECTOR) 1.3 % PTCH Place 1 patch onto the skin 2 (two) times daily as needed (pain).    Marland Kitchen docusate sodium 100 MG CAPS Take 100 mg by mouth 2 (two) times daily. 10 capsule 0  . Fluticasone-Salmeterol (ADVAIR DISKUS) 250-50 MCG/DOSE AEPB Inhale 1 puff into the lungs 2 (two) times daily. 120 each 1  . glipiZIDE (GLUCOTROL) 5 MG tablet Take 5 mg by mouth 2 (two) times daily before a meal.    . hydrALAZINE (APRESOLINE) 50 MG tablet Take 1 tablet (50 mg total) by mouth 3 (three) times daily. 90 tablet 3  . isosorbide mononitrate (IMDUR) 60 MG 24 hr tablet Take 60 mg by mouth daily.    . metolazone (ZAROXOLYN) 2.5 MG tablet Take 1 tablet (2.5 mg total) by mouth 2 (two) times a week. Tuesday and Friday 15 tablet 3  . omega-3 acid  ethyl esters (LOVAZA) 1 G capsule Take 1 g by mouth daily.    Marland Kitchen oxyCODONE-acetaminophen (PERCOCET) 10-325 MG tablet Take 1 tablet by mouth 4 (four) times daily. Scheduled. Use with stool softerner. Refill on 09/18/2015 60 tablet 0  . polyethylene glycol powder (GLYCOLAX/MIRALAX) powder MIX 1 CAPFUL (17GM)  IN  LIQUID  AND  DRINK EVERY DAY AS DIRECTED 1530 g 3  . potassium chloride SA (K-DUR,KLOR-CON) 20 MEQ tablet Take 20 mEq by mouth 2 (two) times daily.    Marland Kitchen tiotropium (SPIRIVA HANDIHALER) 18 MCG inhalation capsule Place 1 capsule (18 mcg total) into inhaler and inhale daily. 90 capsule 2  . torsemide (DEMADEX) 20 MG tablet Take 4 tablets (80 mg total) by mouth 2 (two) times daily. 240 tablet 3   No current facility-administered medications for this visit.    Allergies as of 10/06/2015 - Review Complete 10/06/2015  Allergen Reaction Noted  . Corlanor [ivabradine] Palpitations and Other (See Comments) 11/14/2014  . Entresto [sacubitril-valsartan]  08/27/2015  . Januvia [sitagliptin] Other (See Comments) 04/29/2014  . Lunesta [eszopiclone] Palpitations and Other (See Comments) 11/14/2014  . Actos [pioglitazone] Other (See Comments) 07/03/2013    Vitals: BP 118/78 mmHg  Pulse 82  Resp 20  Ht  (1.778 m)  Wt 224 lb (101.606 kg)  BMI 32.14 kg/m2 Last Weight:  Wt Readings from Last 1 Encounters:  10/06/15 224 lb (101.606 kg)   ZOX:WRUE mass index is 32.14 kg/(m^2).     Last Height:   Ht Readings from Last 1 Encounters:  10/06/15  (1.778 m)    Physical exam:  General: The patient is awake, alert and appears not in acute distress. The patient is well groomed. Head: Normocephalic, atraumatic. Neck is supple. Mallampati 4,  neck circumference:17.25 .  Nasal airflow restricited , hoarse voice, macroglossia . Retrognathia is seen.  Cardiovascular:  Regular rate and rhythm, without  murmurs or carotid bruit, and without distended neck veins. Respiratory: Lungs are clear to  auscultation. Skin:  Without evidence of edema, or rash Trunk: BMI is elevated . The patient's posture is hunched.  Neurologic exam : The patient is awake and alert, oriented to place and time.   Memory subjective described as intact.( He has had periods of disorientation while  on high doses of BP medications). Attention span & concentration ability appears normal.  Speech is fluent,  with dysarthria, dysphonia. Mood and affect are appropriate.  Cranial nerves: Pupils are equal and briskly reactive to light.  Funduscopic exam deferred .  Extraocular movements  in vertical and horizontal planes intact and without nystagmus. Visual fields by finger perimetry are intact. Hearing to finger rub intact.   Facial sensation intact to fine touch.  Facial motor strength is symmetric and tongue and uvula move midline. Shoulder shrug was symmetrical.   Motor exam: Normal tone, muscle bulk and strength in upper extremities. The patient is status post left hip replacement and his adduction and flexion at the hip is weaker on the left than on the right. He has good grip strength.  Coordination: Rapid alternating movements in the fingers/hands was normal. Finger-to-nose maneuver  normal without evidence of ataxia, dysmetria . There is a mild tremor in his right hand noted with action but not at rest.  Gait and station: Patient walks with cane as assistive device   Stance is wider based.  Toe stand was deferred, as was tandem gait. Turns with  4 Steps.  Deep tendon reflexes: in the  upper and lower extremities are symmetric and intact. Babinski maneuver response is downgoing.  The patient was advised of the nature of the diagnosed sleep disorder , the treatment options and risks for general a health and wellness arising from not treating the condition.  I spent more than 60 minutes time with this patient, his very long past and current medical problem list and their interaction on his sleep and overall  wellness. More than 505 of the  of face to face time with the patient was spent in counseling and coordination of care. We have discussed the diagnosis and differential and I answered the patient's questions.  We agreed on an attended sleep study with capnography.    Assessment:  After physical and neurologic examination, review of laboratory studies,  Personal review of imaging studies, reports of other /same  Imaging studies ,  Results of polysomnography/ neurophysiology testing and pre-existing records as far as provided in visit., my assessment is :  1) Mr. Wagar has multiple comorbidities including metabolic dysfunctions related to chronic kidney disease, diabetes mellitus, and congestive heart failure he also has a history of paroxysmal atrial fibrillation. All these are risk factors for strokes of which she had one in August 2014 . 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 This patient is on equal risk for having central or obstructive sleep apnea score a mixture of both. Central sleep apnea is are influenced by pH and CO2 chemotherapy receptors which can be off target with metabolic disorders. They also respond to narcotic pain medication, cocaine and alcohol.   2) besides  high risk factors for obstructive or central sleep apnea , the patient has an underlying comorbidity of COPD and CHF.  Overlap syndrome usually leads to hypoxemia and hypercapnia at night. He  does not report morning headaches. I will still order a capnography.  3) the patient will keep his follow-up appointments with Frank Davidson in nephrology and with his cardiologist. I strongly discourage narcotic pain medications especially at night in a patient with multiple risks for central and obstructive sleep apnea. Pain management should introduce non-narcotic medications to raise the pain threshold for this patient.  4) hypertension and diabetes both may benefit from treating obstructive sleep apnea  is found. Treatment of obstructive sleep apnea eliminates tachybradycardia in response to hypoxemia and controlled hypertension. Similarly it allows for deeper sleep stages to be entered before midnight usually allowing a better cleavage of insulin.    Plan:  Treatment plan and additional workup :   Split with Co2. Rv after sleep study.  Check for central apneas  May need o2.   Porfirio Mylar Weslee Fogg MD  10/06/2015   CC: Frank Cables, Md [address] [place], [place] [address]

## 2015-10-06 NOTE — Patient Instructions (Signed)
Sleep Apnea  Sleep apnea is a sleep disorder characterized by abnormal pauses in breathing while you sleep. When your breathing pauses, the level of oxygen in your blood decreases. This causes you to move out of deep sleep and into light sleep. As a result, your quality of sleep is poor, and the system that carries your blood throughout your body (cardiovascular system) experiences stress. If sleep apnea remains untreated, the following conditions can develop:  High blood pressure (hypertension).  Coronary artery disease.  Inability to achieve or maintain an erection (impotence).  Impairment of your thought process (cognitive dysfunction). There are three types of sleep apnea: 1. Obstructive sleep apnea--Pauses in breathing during sleep because of a blocked airway. 2. Central sleep apnea--Pauses in breathing during sleep because the area of the brain that controls your breathing does not send the correct signals to the muscles that control breathing. 3. Mixed sleep apnea--A combination of both obstructive and central sleep apnea. RISK FACTORS The following risk factors can increase your risk of developing sleep apnea:  Being overweight.  Smoking.  Having narrow passages in your nose and throat.  Being of older age.  Being male.  Alcohol use.  Sedative and tranquilizer use.  Ethnicity. Among individuals younger than 35 years, African Americans are at increased risk of sleep apnea. SYMPTOMS   Difficulty staying asleep.  Daytime sleepiness and fatigue.  Loss of energy.  Irritability.  Loud, heavy snoring.  Morning headaches.  Trouble concentrating.  Forgetfulness.  Decreased interest in sex.  Unexplained sleepiness. DIAGNOSIS  In order to diagnose sleep apnea, your caregiver will perform a physical examination. A sleep study done in the comfort of your own home may be appropriate if you are otherwise healthy. Your caregiver may also recommend that you spend the  night in a sleep lab. In the sleep lab, several monitors record information about your heart, lungs, and brain while you sleep. Your leg and arm movements and blood oxygen level are also recorded. TREATMENT The following actions may help to resolve mild sleep apnea:  Sleeping on your side.   Using a decongestant if you have nasal congestion.   Avoiding the use of depressants, including alcohol, sedatives, and narcotics.   Losing weight and modifying your diet if you are overweight. There also are devices and treatments to help open your airway:  Oral appliances. These are custom-made mouthpieces that shift your lower jaw forward and slightly open your bite. This opens your airway.  Devices that create positive airway pressure. This positive pressure "splints" your airway open to help you breathe better during sleep. The following devices create positive airway pressure:  Continuous positive airway pressure (CPAP) device. The CPAP device creates a continuous level of air pressure with an air pump. The air is delivered to your airway through a mask while you sleep. This continuous pressure keeps your airway open.  Nasal expiratory positive airway pressure (EPAP) device. The EPAP device creates positive air pressure as you exhale. The device consists of single-use valves, which are inserted into each nostril and held in place by adhesive. The valves create very little resistance when you inhale but create much more resistance when you exhale. That increased resistance creates the positive airway pressure. This positive pressure while you exhale keeps your airway open, making it easier to breath when you inhale again.  Bilevel positive airway pressure (BPAP) device. The BPAP device is used mainly in patients with central sleep apnea. This device is similar to the CPAP device because   it also uses an air pump to deliver continuous air pressure through a mask. However, with the BPAP machine, the  pressure is set at two different levels. The pressure when you exhale is lower than the pressure when you inhale.  Surgery. Typically, surgery is only done if you cannot comply with less invasive treatments or if the less invasive treatments do not improve your condition. Surgery involves removing excess tissue in your airway to create a wider passage way.   This information is not intended to replace advice given to you by your health care provider. Make sure you discuss any questions you have with your health care provider.   Document Released: 10/29/2002 Document Revised: 11/29/2014 Document Reviewed: 03/16/2012 Elsevier Interactive Patient Education 2016 Elsevier Inc[employer]   

## 2015-10-07 ENCOUNTER — Telehealth: Payer: Self-pay | Admitting: *Deleted

## 2015-10-07 NOTE — Telephone Encounter (Signed)
Spoke with Damaris Schooner and ok'd extending Pt for 2x a week for 3 weeks per Dr. Conley Rolls

## 2015-10-08 ENCOUNTER — Encounter (HOSPITAL_COMMUNITY): Payer: Self-pay | Admitting: *Deleted

## 2015-10-08 ENCOUNTER — Telehealth (HOSPITAL_COMMUNITY): Payer: Self-pay | Admitting: *Deleted

## 2015-10-08 MED ORDER — POTASSIUM CHLORIDE CRYS ER 20 MEQ PO TBCR: EXTENDED_RELEASE_TABLET | ORAL | Status: DC

## 2015-10-08 NOTE — Telephone Encounter (Signed)
-----   Message from Dolores Patty, MD sent at 10/02/2015  4:05 PM EST ----- Have him take extra 40 kcl today and add extra 20 kcl daily. Repeat 2 weeks.

## 2015-10-08 NOTE — Telephone Encounter (Signed)
Notes Recorded by Noralee Space, RN on 10/08/2015 at 4:42 PM Spoke w/pt he states he was told at his OV on 11/7 to increase his KCL to 40 meq in AM and 20 meq in PM and he has been doing that since then. He states he is sch to see his PCP, Dr Dallas Schimke next week and would like to have repeat labs done there. Message sent to her for this.

## 2015-10-14 ENCOUNTER — Telehealth: Payer: Self-pay | Admitting: Pulmonary Disease

## 2015-10-14 ENCOUNTER — Telehealth: Payer: Self-pay | Admitting: Radiology

## 2015-10-14 ENCOUNTER — Telehealth (HOSPITAL_COMMUNITY): Payer: Self-pay

## 2015-10-14 NOTE — Telephone Encounter (Signed)
Katie, Paramedical calls with report: Patient weight up 5 lbs over night Up total of total of 7 lbs since 11/17 BP 130/100 Last metolazone 2.5 mg on Friday; due for dose today. On Torsemide 80 mg bid  Instructed Katie to have patient to take metolazone now that is due today.  Routed to Dr. Gala Romney for additional instructions

## 2015-10-14 NOTE — Telephone Encounter (Signed)
Home healthcare called and wanted to let you know that his monitor alerted that he had a weight gain on 5lbs since last night. They report that they sent someone out and he is asymptomatic. His vitals were also reported as good. They were just calling to make sure that you were aware before your visit with him 10/15/15.

## 2015-10-14 NOTE — Telephone Encounter (Signed)
Patient cannot afford the Advair and Spiriva.  Patient has [employer].  Patient uses [facility] [address]   Patient requesting more affordable medication.    Allergies  Allergen Reactions  . Corlanor [Ivabradine] Palpitations and Other (See Comments)    Kidney and heart problems, syncope  . Entresto [Sacubitril-Valsartan]     Syncope  . Januvia [Sitagliptin] Other (See Comments)    "Almost killed me"   . Lunesta [Eszopiclone] Palpitations and Other (See Comments)    Kidney and heart problems, syncope  . Actos [Pioglitazone] Other (See Comments)    Caused blood in urine and fluid retention    Current Outpatient Prescriptions on File Prior to Visit  Medication Sig Dispense Refill  . albuterol (PROVENTIL HFA;VENTOLIN HFA) 108 (90 BASE) MCG/ACT inhaler Inhale 2 puffs into the lungs every 6 (six) hours as needed for wheezing or shortness of breath. 1 Inhaler 1  . allopurinol (ZYLOPRIM) 100 MG tablet Take 100 mg by mouth daily.    Marland Kitchen apixaban (ELIQUIS) 5 MG TABS tablet Take 1 tablet (5 mg total) by mouth 2 (two) times daily. 180 tablet 3  . baclofen (LIORESAL) 10 MG tablet TAKE 1 TABLET EVERY 8 HOURS AS NEEDED FOR MUSCLE SPASM(S) 90 tablet 1  . carvedilol (COREG) 25 MG tablet Take 12.5 mg by mouth 2 (two) times daily with a meal.    . diclofenac (FLECTOR) 1.3 % PTCH Place 1 patch onto the skin 2 (two) times daily as needed (pain).    Marland Kitchen docusate sodium 100 MG CAPS Take 100 mg by mouth 2 (two) times daily. 10 capsule 0  . Fluticasone-Salmeterol (ADVAIR DISKUS) 250-50 MCG/DOSE AEPB Inhale 1 puff into the lungs 2 (two) times daily. 120 each 1  . glipiZIDE (GLUCOTROL) 5 MG tablet Take 5 mg by mouth 2 (two) times daily before a meal.    . hydrALAZINE (APRESOLINE) 50 MG tablet Take 1 tablet (50 mg total) by mouth 3 (three) times daily. 90 tablet 3  . isosorbide mononitrate (IMDUR) 60 MG 24 hr tablet Take 60 mg by mouth daily.    . metolazone (ZAROXOLYN) 2.5 MG tablet Take 1 tablet (2.5 mg total)  by mouth 2 (two) times a week. Tuesday and Friday 15 tablet 3  . omega-3 acid ethyl esters (LOVAZA) 1 G capsule Take 1 g by mouth daily.    Marland Kitchen oxyCODONE-acetaminophen (PERCOCET) 10-325 MG tablet Take 1 tablet by mouth 4 (four) times daily. Scheduled. Use with stool softerner. Refill on 09/18/2015 60 tablet 0  . polyethylene glycol powder (GLYCOLAX/MIRALAX) powder MIX 1 CAPFUL (17GM)  IN  LIQUID  AND  DRINK EVERY DAY AS DIRECTED 1530 g 3  . potassium chloride SA (K-DUR,KLOR-CON) 20 MEQ tablet Take 2 tabs in AM and 1 tab in PM    . tiotropium (SPIRIVA HANDIHALER) 18 MCG inhalation capsule Place 1 capsule (18 mcg total) into inhaler and inhale daily. 90 capsule 2  . torsemide (DEMADEX) 20 MG tablet Take 4 tablets (80 mg total) by mouth 2 (two) times daily. 240 tablet 3   No current facility-administered medications on file prior to visit.

## 2015-10-15 ENCOUNTER — Telehealth: Payer: Self-pay

## 2015-10-15 ENCOUNTER — Ambulatory Visit (INDEPENDENT_AMBULATORY_CARE_PROVIDER_SITE_OTHER): Payer: Commercial Managed Care - HMO | Admitting: Family Medicine

## 2015-10-15 ENCOUNTER — Encounter: Payer: Self-pay | Admitting: Family Medicine

## 2015-10-15 VITALS — BP 118/82 | HR 95 | Temp 98.1°F | Resp 16 | Ht 70.0 in | Wt 227.2 lb

## 2015-10-15 DIAGNOSIS — Z23 Encounter for immunization: Secondary | ICD-10-CM

## 2015-10-15 DIAGNOSIS — N184 Chronic kidney disease, stage 4 (severe): Secondary | ICD-10-CM | POA: Diagnosis not present

## 2015-10-15 DIAGNOSIS — G894 Chronic pain syndrome: Secondary | ICD-10-CM | POA: Diagnosis not present

## 2015-10-15 DIAGNOSIS — R0683 Snoring: Secondary | ICD-10-CM

## 2015-10-15 DIAGNOSIS — Z13220 Encounter for screening for lipoid disorders: Secondary | ICD-10-CM | POA: Diagnosis not present

## 2015-10-15 LAB — LIPID PANEL
CHOL/HDL RATIO: 3.1 ratio (ref <=5.0)
CHOLESTEROL: 153 mg/dL (ref 125–200)
HDL: 50 mg/dL (ref >=40)
LDL Cholesterol: 80 mg/dL (ref <130)
TRIGLYCERIDES: 117 mg/dL (ref <150)
VLDL: 23 mg/dL (ref <30)

## 2015-10-15 NOTE — Patient Instructions (Signed)
It is good to see you today!   You got your prevnar pneumonia vaccine today  Please contact [facility] soon and schedule an appt- you have seen Dr. Nelle Don in the past.

## 2015-10-15 NOTE — Telephone Encounter (Signed)
Patient called stating he was seen today by Dr. Patsy Lager and his refills were not sent to [facility]. He needs the Lyrica and his patches (couldn't understand what they were). He said not to send to the pharmacy, they need to go to the manufacturer. Please call when done.

## 2015-10-15 NOTE — Telephone Encounter (Signed)
The [facility] and spoke to [place], Associate Professor. Dorathy Daft ran a claim to see how much the medications would cost.The pt's Breo 100 would be $400 out of pocket for a 90 day supply and the Incruse is $402 out of pocket for 90 days. Both Breo and Incruse the insurance did contribute to the pay but it is at a high tier.   LMTCB for pt to have him call his insurance to see what medications are a cheaper covered alternative to the Advair and Spiriva.

## 2015-10-15 NOTE — Telephone Encounter (Signed)
Sandy from [facility] is calling to request a referral for patient to see Dr. Briant Cedar on 11/29. NPI # [id number]. Diagnosis code is N18.4 for chronic kidney disease stage 4 Phone: [phone]

## 2015-10-15 NOTE — Telephone Encounter (Signed)
Per SN>> Call pt's pharmacy and check whether Breo and Incruse are covered to supplement Advair and Spiriva. If they are covered send script for Breo 1 puff Q day and Incruse 1 puff Q day. If not covered please let SN know.

## 2015-10-15 NOTE — Progress Notes (Signed)
[facility] [address] [place] [address] [phone]  Date:  10/15/2015   Name:  Frank Davidson   DOB:  August 16, 1951   MRN:  [id number]  PCP:  Abbe Amsterdam, MD    Chief Complaint: Referral and sleep study   History of Present Illness:  Frank Davidson is a 64 y.o. very pleasant male patient who presents with the following:  Here today for follow-up.  He needs a referral to see his nephrologist Dr. Briant Cedar which I did today. He also needs a referral for a sleep study and for pain management.  He is seeing Dr. Marylou Flesher but needs sleep study referral from his PCP (me) He is fasting today for labs.   Next visit with his cardiologist is 10/31/15.  He tells me that he is now being visited at home 2x a week for medication management and weight check.  His home care person (he says a "paramedic") communicates with his CHF team  Is overdue for an eye exam History of well controlled DM- no meds for same Has not yet had prevnar and has turned 64 Most recent creat was about his baseline.    Lab Results  Component Value Date   HGBA1C 5.9* 09/02/2015    Wt Readings from Last 3 Encounters:  10/15/15 227 lb 3.2 oz (103.057 kg)  10/06/15 224 lb (101.606 kg)  09/29/15 221 lb 8 oz (100.472 kg)     Patient Active Problem List   Diagnosis Date Noted  . Morbid obesity due to excess calories (HCC) 10/06/2015  . Snoring 10/06/2015  . Hypersomnia with sleep apnea 10/06/2015  . COPD exacerbation (HCC) 10/06/2015  . CHF (congestive heart failure), NYHA class I (HCC) 10/06/2015  . Paroxysmal atrial fibrillation (HCC) 10/06/2015  . Chronic pain syndrome 09/29/2015  . CKD (chronic kidney disease), stage IV (HCC) 09/15/2015  . Acute on chronic systolic congestive heart failure (HCC)   . Type 2 diabetes mellitus with stage 4 chronic kidney disease, without long-term current use of insulin (HCC)   . Nausea & vomiting   . Acute on chronic systolic CHF (congestive  heart failure) (HCC)   . AKI (acute kidney injury) (HCC)   . Acute kidney injury (HCC) 08/27/2015  . Arterial hypotension   . Hip pain 07/23/2015  . Acute exacerbation of CHF (congestive heart failure) (HCC)   . Dyspnea   . Acute renal failure superimposed on stage 3 chronic kidney disease (HCC) 07/01/2015  . Herpes ocular   . Acute on chronic systolic heart failure (HCC) 06/19/2015  . Facial rash 06/19/2015  . Diabetes mellitus type 2, controlled (HCC) 06/19/2015  . Essential hypertension 06/19/2015  . Chronic anemia 06/19/2015  . CHF (congestive heart failure) (HCC) 06/19/2015  . CHF exacerbation (HCC) 06/18/2015  . Non-ischemic cardiomyopathy- EF 15% echo Aug 2015 11/24/2014  . Chronic anticoagulation 11/24/2014  . S/P left THA, AA 09/12/2014  . Gout 04/29/2014  . Ocular herpes   . Neuropathy (HCC)   . OSA (obstructive sleep apnea) 07/09/2013  . Chronic systolic heart failure (HCC) 07/05/2013  . 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 PAF (paroxysmal atrial fibrillation) (HCC) 07/03/2013  . Hypertension   . COPD (chronic obstructive pulmonary disease) (HCC)   . Diabetes mellitus with nephropathy   . CKD (chronic kidney disease), stage III   . Polysubstance abuse     Past Medical History  Diagnosis Date  . Atrial fibrillation (HCC)     PAF  .  Hypertension   . COPD (chronic obstructive pulmonary disease) (HCC)   . Ocular herpes   . Gout   . CHF (congestive heart failure) (HCC)   . Chronic pain   . Hyperlipidemia   . Nonischemic cardiomyopathy (HCC) Aug 2015    EF 15%  . Ocular herpes   . Neuropathy (HCC)   . Left-sided weakness     "because of the arthritis and edema"  . PVD (peripheral vascular disease) [facility]) May 2015    abnormal ABIs  . Memory impairment     "short term"  . CKD (chronic kidney disease), stage III     Dr. Briant Cedar follows-holding   . Fibromyalgia     neuropathy- hands, more than feet.  . Sleep apnea with use of continuous positive airway  pressure (CPAP)     does not use cpap  . GERD (gastroesophageal reflux disease)     tums as needed  . Asthma   . Type II diabetes mellitus (HCC)   . Stroke [facility]) X 5    last stroke aug 2014"; denies residual on 09/12/2014  . Osteoarthritis   . Arthritis     "knees, right elbow, shoulder" (09/12/2014)  . Shortness of breath dyspnea     Past Surgical History  Procedure Laterality Date  . 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 Tonsillectomy  age 32  . Esophagogastroduodenoscopy N/A 12/24/2013    Procedure: ESOPHAGOGASTRODUODENOSCOPY (EGD);  Surgeon: Louis Meckel, MD;  Location: [facility];  Service: Endoscopy;  Laterality: N/A;  . Colonoscopy N/A 12/24/2013    Procedure: COLONOSCOPY;  Surgeon: Louis Meckel, MD;  Location: [facility] ENDOSCOPY;  Service: Endoscopy;  Laterality: N/A;  . Joint replacement    . Cardiac catheterization  10/2006    normal coronary arteries;   Marland Kitchen Cataract extraction w/ intraocular lens  implant, bilateral Bilateral ~ 2008  . Total hip arthroplasty Left 09/12/2014    Procedure: LEFT TOTAL HIP ARTHROPLASTY ANTERIOR APPROACH;  Surgeon: [name], MD;  Location: [facility] OR;  Service: Orthopedics;  Laterality: Left;  . Right heart catheterization N/A 08/13/2014    Rt ht cath prior to hip surgery    Social History  Substance Use Topics  . Smoking status: Former Smoker -- 0.50 packs/day for 13 years    Types: Cigarettes    Quit date: 12/18/2006  . Smokeless tobacco: Never Used  . Alcohol Use: No     Comment: Quit 2008    Family History  Problem Relation Age of Onset  . Cancer Mother   . Hypertension Mother   . Lung disease Mother   . Diabetes Father   . Diabetes Sister   . Hypertension Sister   . Hypertension Brother   . Hypertension Sister   . Colon cancer Neg Hx     Allergies  Allergen Reactions  . Corlanor [Ivabradine] Palpitations and Other (See Comments)    Kidney and heart problems, syncope  . Entresto [Sacubitril-Valsartan]     Syncope  .  Januvia [Sitagliptin] Other (See Comments)    "Almost killed me"   . Lunesta [Eszopiclone] Palpitations and Other (See Comments)    Kidney and heart problems, syncope  . Actos [Pioglitazone] Other (See Comments)    Caused blood in urine and fluid retention     Medication list has been reviewed and updated.  Current Outpatient Prescriptions on File Prior to Visit  Medication Sig Dispense Refill  . albuterol (PROVENTIL HFA;VENTOLIN HFA) 108 (90 BASE) MCG/ACT inhaler  Inhale 2 puffs into the lungs every 6 (six) hours as needed for wheezing or shortness of breath. 1 Inhaler 1  . allopurinol (ZYLOPRIM) 100 MG tablet Take 100 mg by mouth daily.    Marland Kitchen apixaban (ELIQUIS) 5 MG TABS tablet Take 1 tablet (5 mg total) by mouth 2 (two) times daily. 180 tablet 3  . baclofen (LIORESAL) 10 MG tablet TAKE 1 TABLET EVERY 8 HOURS AS NEEDED FOR MUSCLE SPASM(S) 90 tablet 1  . carvedilol (COREG) 25 MG tablet Take 12.5 mg by mouth 2 (two) times daily with a meal.    . diclofenac (FLECTOR) 1.3 % PTCH Place 1 patch onto the skin 2 (two) times daily as needed (pain).    Marland Kitchen docusate sodium 100 MG CAPS Take 100 mg by mouth 2 (two) times daily. 10 capsule 0  . Fluticasone-Salmeterol (ADVAIR DISKUS) 250-50 MCG/DOSE AEPB Inhale 1 puff into the lungs 2 (two) times daily. 120 each 1  . glipiZIDE (GLUCOTROL) 5 MG tablet Take 5 mg by mouth 2 (two) times daily before a meal.    . hydrALAZINE (APRESOLINE) 50 MG tablet Take 1 tablet (50 mg total) by mouth 3 (three) times daily. 90 tablet 3  . isosorbide mononitrate (IMDUR) 60 MG 24 hr tablet Take 60 mg by mouth daily.    . metolazone (ZAROXOLYN) 2.5 MG tablet Take 1 tablet (2.5 mg total) by mouth 2 (two) times a week. Tuesday and Friday 15 tablet 3  . omega-3 acid ethyl esters (LOVAZA) 1 G capsule Take 1 g by mouth daily.    Marland Kitchen oxyCODONE-acetaminophen (PERCOCET) 10-325 MG tablet Take 1 tablet by mouth 4 (four) times daily. Scheduled. Use with stool softerner. Refill on 09/18/2015 60  tablet 0  . polyethylene glycol powder (GLYCOLAX/MIRALAX) powder MIX 1 CAPFUL (17GM)  IN  LIQUID  AND  DRINK EVERY DAY AS DIRECTED 1530 g 3  . potassium chloride SA (K-DUR,KLOR-CON) 20 MEQ tablet Take 2 tabs in AM and 1 tab in PM    . tiotropium (SPIRIVA HANDIHALER) 18 MCG inhalation capsule Place 1 capsule (18 mcg total) into inhaler and inhale daily. 90 capsule 2  . torsemide (DEMADEX) 20 MG tablet Take 4 tablets (80 mg total) by mouth 2 (two) times daily. 240 tablet 3   No current facility-administered medications on file prior to visit.    Review of Systems:  As per HPI- otherwise negative.   Physical Examination: Filed Vitals:   10/15/15 1411  BP: 118/82  Pulse: 95  Temp: 98.1 F (36.7 C)  Resp: 16   Filed Vitals:   10/15/15 1411  Height:  (1.778 m)  Weight: 227 lb 3.2 oz (103.057 kg)   Body mass index is 32.6 kg/(m^2). Ideal Body Weight: Weight in (lb) to have BMI = 25: 173.9  GEN: WDWN, NAD, Non-toxic, A & O x 3, looks well and his usual self today HEENT: Atraumatic, Normocephalic. Neck supple. No masses, No LAD. Ears and Nose: No external deformity. CV: RRR, No M/G/R. No JVD. No thrill. No extra heart sounds. PULM: CTA B, no wheezes, crackles, rhonchi. No retractions. No resp. distress. No accessory muscle use. EXTR: No c/c/e NEURO Normal gait.  PSYCH: Normally interactive. Conversant. Not depressed or anxious appearing.  Calm demeanor.    Assessment and Plan: Chronic kidney disease, stage IV (severe) (HCC) - Plan: Ambulatory referral to Nephrology  Snoring - Plan: Ambulatory referral to Sleep Studies  Chronic pain syndrome - Plan: Ambulatory referral to Pain Clinic  Screening for hyperlipidemia -  Plan: Lipid panel  Immunization due - Plan: Pneumococcal conjugate vaccine 13-valent IM  prevnar today Did referrals as requested Due for a lipid panel It sounds like he is being intensively managed per the advanced CHF team- we home this will help him  stay out of the hospital.  He unfortunately has severe CHF and also kidney issues which makes it difficult to keep his fluid balanced.   Signed Abbe Amsterdam, MD

## 2015-10-16 ENCOUNTER — Encounter: Payer: Self-pay | Admitting: Family Medicine

## 2015-10-17 NOTE — Telephone Encounter (Signed)
Left message for patient to call back  

## 2015-10-17 NOTE — Telephone Encounter (Signed)
Agree. Let's f/u next week.

## 2015-10-17 NOTE — Telephone Encounter (Signed)
The insurance referral with Francine Graven has been placed for the patient's office visit on 10/21/15.

## 2015-10-18 NOTE — Telephone Encounter (Signed)
Left VM for pt - per Dr. Patsy Lager we just needed more information about what he needed [place] to do with his Lyrica and Patches. Pt wants sent to manufacturer instead of pharmacy but there is no way for [place] to do that.

## 2015-10-20 ENCOUNTER — Telehealth: Payer: Self-pay

## 2015-10-20 DIAGNOSIS — G894 Chronic pain syndrome: Secondary | ICD-10-CM

## 2015-10-20 NOTE — Telephone Encounter (Signed)
Left message for patient to call back  

## 2015-10-20 NOTE — Telephone Encounter (Signed)
Pt would like a refill on his oxyCODONE-acetaminophen (PERCOCET) 10-325 MG tablet [[id number]] script. Please advise at [phone]

## 2015-10-21 NOTE — Telephone Encounter (Signed)
Called and [facility]- sorry I just got this today, not in the office.  Will refill tomorrow- if this is not ok please call back

## 2015-10-21 NOTE — Telephone Encounter (Signed)
lmtcb X3 for pt. 

## 2015-10-22 ENCOUNTER — Telehealth: Payer: Self-pay

## 2015-10-22 DIAGNOSIS — G894 Chronic pain syndrome: Secondary | ICD-10-CM

## 2015-10-22 MED ORDER — PREGABALIN 75 MG PO CAPS: 75.0000 mg | ORAL_CAPSULE | Freq: Every day | ORAL | Status: DC

## 2015-10-22 MED ORDER — OXYCODONE-ACETAMINOPHEN 10-325 MG PO TABS: 1.0000 | ORAL_TABLET | Freq: Four times a day (QID) | ORAL | Status: DC

## 2015-10-22 MED ORDER — DICLOFENAC EPOLAMINE 1.3 % TD PTCH: 1.0000 | MEDICATED_PATCH | Freq: Two times a day (BID) | TRANSDERMAL | Status: DC | PRN

## 2015-10-22 MED ORDER — PREGABALIN 75 MG PO CAPS: 75.0000 mg | ORAL_CAPSULE | Freq: Two times a day (BID) | ORAL | Status: DC

## 2015-10-22 NOTE — Telephone Encounter (Signed)
Patient called to ask for refills for Lyrica and Flector to be sent to [facility] by fax at [phone].  He would like to get these refilled before the end of the year for insurance purposes.  CB#: [phone]

## 2015-10-22 NOTE — Telephone Encounter (Signed)
Called him back They give him a 90 day supply Patches 2 a day He takes lyrca 75 mg once a day

## 2015-10-22 NOTE — Telephone Encounter (Signed)
Dr. Patsy Lager please print and I can fax.

## 2015-10-23 ENCOUNTER — Telehealth (HOSPITAL_COMMUNITY): Payer: Self-pay

## 2015-10-23 ENCOUNTER — Telehealth: Payer: Self-pay | Admitting: Neurology

## 2015-10-23 NOTE — Telephone Encounter (Signed)
Patient called to advise he can't afford Fluticasone-Salmeterol (ADVAIR DISKUS) 250-50 MCG/DOSE AEPB, states he can afford tiotropium (SPIRIVA HANDIHALER) 18 MCG inhalation capsule

## 2015-10-23 NOTE — Telephone Encounter (Signed)
Returned pt's call. I advised him that our office does not prescribe these medications. He would need to call Dr. Jodelle Green office. I see that Dr. Jodelle Green office has attempted to get in touch with him several times, and I will route this message to them.

## 2015-10-23 NOTE — Telephone Encounter (Signed)
Katie, paramedical reports that patients weight was 217 lbs 3 weeks ago.  Base weight 217-219 Last week it was 224 lbs the day he was due for his twice weekly (Tues, Friday)  metolazone 2.5 mg Today weight remains 224 lbs BP 130/80 Patient is going to take an additional metolazone 2.5 mg today in addition to his second weekly dose due tomorrow. Will also take an extra potasium 20 mEq this afternoon; normal dose 40 mEq AM and 20 mEq pm  Called patient with instructions.  Repeated instructions correctly. Will be on Thursday day of office appointment and have BMET done

## 2015-10-23 NOTE — Telephone Encounter (Signed)
LVM for patient to return call. 

## 2015-10-24 ENCOUNTER — Telehealth: Payer: Self-pay | Admitting: Pulmonary Disease

## 2015-10-24 NOTE — Telephone Encounter (Signed)
TE has been closed ------------------------------------------- Called and left message for patient to call back. Awaiting call back from patient.

## 2015-10-24 NOTE — Telephone Encounter (Signed)
Geronimo Running, RN at 10/23/2015 1:13 PM     Status: Signed       Expand All Collapse All   Returned pt's call. I advised him that our office does not prescribe these medications. He would need to call Dr. Jodelle Green office. I see that Dr. Jodelle Green office has attempted to get in touch with him several times, and I will route this message to them.            Kathyrn Sheriff at 10/23/2015 12:45 PM     Status: Signed       Expand All Collapse All   Patient called to advise he can't afford Fluticasone-Salmeterol (ADVAIR DISKUS) 250-50 MCG/DOSE AEPB, states he can afford tiotropium (SPIRIVA HANDIHALER) 18 MCG inhalation capsule       Dr. Kriste Basque, please advise if you would like to put patient on Spiriva Handihaler?

## 2015-10-24 NOTE — Telephone Encounter (Signed)
See prior phone note 12/13/14. Spoke with pt. He is not able to afford his advair. He is going to call his insurance to see what is more affordable for him and call us back. Will await call back

## 2015-10-25 ENCOUNTER — Other Ambulatory Visit: Payer: Self-pay | Admitting: Student

## 2015-10-27 NOTE — Telephone Encounter (Signed)
Left message for patient to call back  

## 2015-10-28 ENCOUNTER — Other Ambulatory Visit: Payer: Self-pay | Admitting: *Deleted

## 2015-10-28 ENCOUNTER — Other Ambulatory Visit: Payer: Self-pay | Admitting: Family Medicine

## 2015-10-28 DIAGNOSIS — Z01810 Encounter for preprocedural cardiovascular examination: Secondary | ICD-10-CM

## 2015-10-28 DIAGNOSIS — N183 Chronic kidney disease, stage 3 unspecified: Secondary | ICD-10-CM

## 2015-10-28 NOTE — Telephone Encounter (Signed)
Called and spoke with pt- states that he spoke with insurance and they told him there were no alternatives for Advair. Advised the patient that I would call.  [facility] [phone] Member ID# [id number] Covered alternatives are: Breo 100, Dulera 100, Advair 100-50  Please advise Dr Kriste Basque. Thanks.

## 2015-10-29 MED ORDER — MOMETASONE FURO-FORMOTEROL FUM 100-5 MCG/ACT IN AERO: 2.0000 | INHALATION_SPRAY | Freq: Two times a day (BID) | RESPIRATORY_TRACT | Status: DC

## 2015-10-29 NOTE — Telephone Encounter (Signed)
Per SN>> Change medication to [facility] 100 with instructions of 2 puffs twice daily  Order sent electronically to pharmacy Pt notified of medication change  Nothing further is needed at this time.

## 2015-10-29 NOTE — Telephone Encounter (Signed)
Awaiting response from Dr. Nadel 

## 2015-10-30 ENCOUNTER — Ambulatory Visit (HOSPITAL_COMMUNITY)
Admission: RE | Admit: 2015-10-30 | Discharge: 2015-10-30 | Disposition: A | Discharge status: Home / Self Care | Payer: Commercial Managed Care - HMO | Source: Ambulatory Visit | Attending: Internal Medicine | Admitting: Internal Medicine

## 2015-10-30 ENCOUNTER — Other Ambulatory Visit (HOSPITAL_COMMUNITY): Payer: Commercial Managed Care - HMO

## 2015-10-30 ENCOUNTER — Encounter (HOSPITAL_COMMUNITY): Payer: Self-pay | Admitting: Internal Medicine

## 2015-10-30 VITALS — BP 112/68 | HR 87 | Wt 229.5 lb

## 2015-10-30 DIAGNOSIS — N184 Chronic kidney disease, stage 4 (severe): Secondary | ICD-10-CM | POA: Diagnosis not present

## 2015-10-30 DIAGNOSIS — I5023 Acute on chronic systolic (congestive) heart failure: Secondary | ICD-10-CM | POA: Diagnosis not present

## 2015-10-30 DIAGNOSIS — I5022 Chronic systolic (congestive) heart failure: Secondary | ICD-10-CM | POA: Diagnosis not present

## 2015-10-30 DIAGNOSIS — I480 Paroxysmal atrial fibrillation: Secondary | ICD-10-CM | POA: Insufficient documentation

## 2015-10-30 DIAGNOSIS — J449 Chronic obstructive pulmonary disease, unspecified: Secondary | ICD-10-CM | POA: Insufficient documentation

## 2015-10-30 DIAGNOSIS — M109 Gout, unspecified: Secondary | ICD-10-CM | POA: Diagnosis not present

## 2015-10-30 LAB — BASIC METABOLIC PANEL
ANION GAP: 12 (ref 5–15)
BUN: 55 mg/dL — ABNORMAL HIGH (ref 6–20)
CALCIUM: 9.9 mg/dL (ref 8.9–10.3)
CO2: 29 mmol/L (ref 22–32)
Chloride: 97 mmol/L — ABNORMAL LOW (ref 101–111)
Creatinine, Ser: 2.58 mg/dL — ABNORMAL HIGH (ref 0.61–1.24)
GFR, EST AFRICAN AMERICAN: 28 mL/min — AB (ref >60)
GFR, EST NON AFRICAN AMERICAN: 24 mL/min — AB (ref >60)
Glucose, Bld: 72 mg/dL (ref 65–99)
Potassium: 3.6 mmol/L (ref 3.5–5.1)
Sodium: 138 mmol/L (ref 135–145)

## 2015-10-30 NOTE — Progress Notes (Signed)
Patient ID: Frank Davidson, male   DOB: February 23, 1951, 64 y.o.   MRN: [id number]    [facility]  PCP: Dr. Shela Commons Copland Nephrologist: Dr. Briant Cedar HF: Frank Davidson  HPI: Frank Davidson is a 64 y.o. male with a hx of obesity, PAF, HTN, COPD, HTN, gout, DM 2, CVA and CHF EF 25% due to NICM. Intolerant Entresto at the lowest dose due to syncope.   Has previously followed in the [facility] in [place]. Previous cath in 2007 showed NICM with normal cors. Was living alone in [place] and apparently had issues managing medications by himself and was getting confused with PCP and cardiologists medications and was not taking his meds correctly. He moved to [place] to be near his nieces. Issues in the past with ACE-I due to renal failure. Highest creatinine in our system is ~1.5 but was apparently higher previously - thought to be related to taking his medications incorrectly.   Was admitted in 06/2013 from [facility] with left sided weakness and was found to have a moderate to large right hemispheric infarct. Went back to [facility].   Admitted to [facility] 10/14 through 09/09/13 with ADHF. Diuresed 30 pounds with IV lasix and transitioned to lasix 80 mg po bid. Continued on 25 mg carvedilol bid, isordil 20 mg daily.  Started on lisinopril 2.5 mg daily. Discharge weight 229 pounds. 09/29/13 CT abdomen and pelvis - no obstruction . Diverticulosis. No inflammatory changes.   Admitted 11/23/13 through 11/26/13 with volume overload and dyspnea. Had worsening cardiorenal syndrome. Nephrology consulted and managed with IV lasix and transitioned to torsemide 40 mg twice a day.  Diuresed 25 pounds. Discharge weight 232 pounds. Hydralazine, Spiro, and Lisinopril stopped at discharge. He is also off digoxin.    Admitted 07/01/15 for AKI after presumed over diuresis. His Cr on admit was 4.6., though his baseline is 2.  We followed him in the hospital and held diuresis for several days.  Cr on discharge was  2.36. We decreased his spiro to 12.5 mg, and his diuretic regimen was continued at 40 mg torsemide BID.  Discharge weight was 229.  Admitted in 10/16 with acute on chronic systolic CHF and AKI with creatinine up to 7.1 => ATN.  Suspected cardiorenal syndrome, but surprisingly co-ox was normal.  CVVH was required but renal function gradually improved. D/c weight 212 pounds.  He returns for regular follow-up.  At last visit he had responded well to an increased diuretic regimen and no changes were made. He is up 8 lbs since last visit. Weight has fluctuated since last visit, has required several extra doses of metolazone since then as well. Weight at home 224 this morning.  Had been as low as 217 in the past, then up to 221. Has been 224 for several weeks, despite extra metolazone.  Has not had increased SOB. No bendopnea. Chronic 2 pillow orthopnea. Drinks about 40-50 oz. No added salt, gets between 1800-2200 mg of Na day. Saw Dr Briant Cedar end of November, kidneys stable but they still recommended getting a shunt placed in anticipation of dialysis.   PFTs (9/16): Severe obstruction.    ECHO 11/08/13 EF 15%  ECHO 07/05/14: EF 15%  ECHO 11/26/2014: EF 20%  ECHO 10/16: EF 25-30%, mild LV dilation, diffuse hypokinesis, mild MR, mild RV dilation  08/13/14 RA = 7  RV = 50/3/10  PA = 54/22 (34)  PCW = 16  Fick cardiac output/index = 6.9/3.0  Thermo CO/CI = 4.7/2.1  PVR =  4.1 WU  Ao sat = 94%  PA sat = 68%,68%  SVC sat (sheath) = 62%  Labs 08/22/14 K 4.4 creatinine 1.6  09/02/14 Creatinine 1.6 10/20/14: creatinine 2.58, K 5.1 11/23/2014: Creatinine 4.12 --Ace and Spiro stopped 11/24/2014: Creatinine 3.55 1/4/12016: 2.49  11/26/2014: Hgb 10.4 Hct 31.7 Creatinine 1.94  12/03/14: K 4, creatinine 2.25 02/19/2015: K 3.8 Creatinine 2.66  05/05/2015: K 3.4 Creatinine 2.68   8/16: K 3.5 Creatinine 2.36 => 2.84, hgb 10.4, BNP 523 10/16: K 3.7, creatinine 2.57 => 2.6 => 2.8 09/12/15: K 3.7, creatinine 2.82, BUN  79 09/15/15: K 3.8, creatinine 2.84, BUN 77  SH: Retired Engineer, 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  Past Medical History  Diagnosis Date  . Atrial fibrillation (HCC)     PAF  . Hypertension   . COPD (chronic obstructive pulmonary disease) (HCC)   . Ocular herpes   . Gout   . CHF (congestive heart failure) (HCC)   . Chronic pain   . Hyperlipidemia   . Nonischemic cardiomyopathy (HCC) Aug 2015    EF 15%  . Ocular herpes   . Neuropathy (HCC)   . Left-sided weakness     "because of the arthritis and edema"  . PVD (peripheral vascular disease) [facility]) May 2015    abnormal ABIs  . Memory impairment     "short term"  . CKD (chronic kidney disease), stage III     Dr. Briant Cedar follows-holding   . Fibromyalgia     neuropathy- hands, more than feet.  . Sleep apnea with use of continuous positive airway pressure (CPAP)     does not use cpap  . GERD (gastroesophageal reflux disease)     tums as needed  . Asthma   . Type II diabetes mellitus (HCC)   . Stroke [facility]) X 5    last stroke aug 2014"; denies residual on 09/12/2014  . Osteoarthritis   . Arthritis     "knees, right elbow, shoulder" (09/12/2014)  . Shortness of breath dyspnea     Current Outpatient Prescriptions  Medication Sig Dispense Refill  . albuterol (PROVENTIL HFA;VENTOLIN HFA) 108 (90 BASE) MCG/ACT inhaler Inhale 2 puffs into the lungs every 6 (six) hours as needed for wheezing or shortness of breath. 1 Inhaler 1  . allopurinol (ZYLOPRIM) 100 MG tablet Take 100 mg by mouth daily.    Marland Kitchen apixaban (ELIQUIS) 5 MG TABS tablet Take 1 tablet (5 mg total) by mouth 2 (two) times daily. 180 tablet 3  . baclofen (LIORESAL) 10 MG tablet TAKE 1 TABLET EVERY 8 HOURS AS NEEDED FOR MUSCLE SPASM(S) 90 tablet 1  . carvedilol (COREG) 25 MG tablet Take 12.5 mg by mouth 2 (two) times daily with a meal.    . diclofenac (FLECTOR)  1.3 % PTCH Place 1 patch onto the skin 2 (two) times daily as needed (pain). 180 patch 3  . docusate sodium 100 MG CAPS Take 100 mg by mouth 2 (two) times daily. 10 capsule 0  . Fluticasone-Salmeterol (ADVAIR DISKUS) 250-50 MCG/DOSE AEPB Inhale 1 puff into the lungs 2 (two) times daily. 120 each 1  . glipiZIDE (GLUCOTROL) 5 MG tablet Take 5 mg by mouth 2 (two) times daily before a meal.    . hydrALAZINE (APRESOLINE) 50 MG tablet Take 1 tablet (50 mg total) by mouth 3 (three) times daily. 90 tablet 3  .  isosorbide mononitrate (IMDUR) 60 MG 24 hr tablet Take 60 mg by mouth daily.    . metolazone (ZAROXOLYN) 2.5 MG tablet Take 1 tablet (2.5 mg total) by mouth 2 (two) times a week. Tuesday and Friday 15 tablet 3  . mometasone-formoterol (DULERA) 100-5 MCG/ACT AERO Inhale 2 puffs into the lungs 2 (two) times daily. 1 Inhaler prn  . omega-3 acid ethyl esters (LOVAZA) 1 G capsule Take 1 g by mouth daily.    Marland Kitchen oxyCODONE-acetaminophen (PERCOCET) 10-325 MG tablet Take 1 tablet by mouth 4 (four) times daily. Scheduled. Use with stool softerner. Refill on 09/18/2015 60 tablet 0  . polyethylene glycol powder (GLYCOLAX/MIRALAX) powder MIX 1 CAPFUL (17GM)  IN  LIQUID  AND  DRINK EVERY DAY AS DIRECTED 1530 g 3  . potassium chloride SA (K-DUR,KLOR-CON) 20 MEQ tablet Take 2 tabs in AM and 1 tab in PM    . pregabalin (LYRICA) 75 MG capsule Take 1 capsule (75 mg total) by mouth daily. 90 capsule 3  . tiotropium (SPIRIVA HANDIHALER) 18 MCG inhalation capsule Place 1 capsule (18 mcg total) into inhaler and inhale daily. 90 capsule 2  . torsemide (DEMADEX) 20 MG tablet Take 4 tablets (80 mg total) by mouth 2 (two) times daily. 240 tablet 3   No current facility-administered medications for this encounter.    Filed Vitals:   10/30/15 1417  BP: 112/68  Pulse: 87  Weight: 229 lb 8 oz (104.101 kg)  SpO2: 96%     Wt Readings from Last 3 Encounters:  10/30/15 229 lb 8 oz (104.101 kg)  10/15/15 227 lb 3.2 oz (103.057  kg)  10/06/15 224 lb (101.606 kg)    PHYSICAL EXAM: General:  Obese male; no resp difficutly HEENT: normal Neck: supple. Thick, JVP difficult to assess, but does not appear elevated. Carotids 2+ bilaterally; no bruits. No thyromegaly or nodule noted. Cor: PMI nonpalpable. RRR. No M/G/R appreciated Lungs: Clear, regular with normal respiratory effort Abdomen: obese. soft, nontender, + distention. No HSM. No bruits or masses. Bowel sounds x 4. Extremities: no cyanosis, clubbing, rash. 0-trace LE edema. Compression stockings. Neuro: alert & orientedx3, cranial nerves grossly intact. Affect pleasant.  ASSESSMENT & PLAN:  1) Chronic Systolic Heart Failure: Nonischemic cardiomyopathy, EF 25-30% in 10/16. NYHA III symptoms.  He refuses consideration for ICD.  Volume status does not appear very elevated on exam. Difficulty balancing diuresis/fluid removal with his renal dysfunction. - Continue torsemide 60 mg BID and metolazone 2.5 mg 30 minutes before am torsemide on Tuesdays and Fridays. - Continue to take extra metolazone as needed (max of 3 total per week). Check BMET  - Continue coreg 12.5 mg BID.  - Continue Imdur + hydralazine 50 mg tid.    - Off digoxin, losartan, and spiro due to CKD. BMET today.  - Instructed to continue daily weights, fluid intake < 2L daily, and to keep Na intake to < 2000 mg daily.  - Knows to call the HF clinic if weight increases more than 3 lbs overnight or 5 lbs in a week.  - Also followed by Paramedicine. 2) Gout: Continue allopurinol. Follow with rheumatology.  3) Atrial fibrillation: Paroxysmal. Continue Eliquis.  4) CKD stage IV:  - Near baseline at last check. Check BMET today. - Saw Dr Briant Cedar at end of November. Kidneys stable but would like him to get a shunt placed in anticipation of dialysis. Pt is very hesitant. Encouraged to consider.  5) COPD:  - Severe obstruction on PFTs.   -  Qualifies for but refuses home 02.  - Prior smoker. -  Sees Dr  Kriste Basque. On [id number] and spiriva with albuterol prn. 6) Suspected OSA: Sleep study scheduled.   BMET today. Follow up 6 weeks.  Graciella Freer PA-C 10/30/2015   Patient seen and examined with Frank Saber, PA-C. We discussed all aspects of the encounter. I agree with the assessment and plan as stated above.   Volume status looks good. Continue current regimen. Strongly encouraged access placement for HD. BP well controlled.   Frank Gallaga,MD 4:25 PM

## 2015-10-30 NOTE — Telephone Encounter (Signed)
Dr Patsy Lager, pt had check up w/you last mos, but gout not discussed recently. OK to give RFs?

## 2015-10-30 NOTE — Patient Instructions (Signed)
Routine lab work today. (bmet) Will notify you of abnormal results  FOLLOW UP: 6 weeks with Dr.Bensimhon

## 2015-11-04 ENCOUNTER — Telehealth: Payer: Self-pay

## 2015-11-04 NOTE — Telephone Encounter (Signed)
Pt wants to talk with dr copland about a referral and medications

## 2015-11-05 NOTE — Telephone Encounter (Signed)
Called him back- he is feeling overwhelmed by tests and procedures. Needs some ppw done for [facility] to get his meds more cheaply.    He will see me on Friday.  I will also send a message to his cardiologist- Frank Davidson is in a very difficult position between his CHF and renal failure.  ? Time to start thinking about palliative care?

## 2015-11-06 ENCOUNTER — Telehealth (HOSPITAL_COMMUNITY): Payer: Self-pay | Admitting: *Deleted

## 2015-11-06 NOTE — Telephone Encounter (Signed)
Katie called to let us know pt had gained 6 lbs overnight but he did eat several pieces of sausage yesterday, she states she reminded him again of NA intake and pt did take a metolazone today.  She had advised him if wt not down to call us back tomorrow

## 2015-11-07 ENCOUNTER — Ambulatory Visit (INDEPENDENT_AMBULATORY_CARE_PROVIDER_SITE_OTHER): Payer: Commercial Managed Care - HMO | Admitting: Family Medicine

## 2015-11-07 ENCOUNTER — Other Ambulatory Visit: Payer: Self-pay | Admitting: Pharmacist

## 2015-11-07 VITALS — BP 102/76 | HR 90 | Temp 98.7°F | Resp 18 | Ht 70.0 in | Wt 231.0 lb

## 2015-11-07 DIAGNOSIS — S46811A Strain of other muscles, fascia and tendons at shoulder and upper arm level, right arm, initial encounter: Secondary | ICD-10-CM | POA: Diagnosis not present

## 2015-11-07 NOTE — Progress Notes (Signed)
[facility] [address], [place] [address] [phone]  Date:  11/07/2015   Name:  Frank Davidson   DOB:  1951-03-24   MRN:  [id number]  PCP:  Abbe Amsterdam, MD    Chief Complaint: Discuss Results and Neck Pain   History of Present Illness:  Frank Davidson is a 64 y.o. very pleasant male patient who presents with the following:  Gentleman with the unfortunate combination of CRF and CHF-  He is here today with a crick in his neck that he woke up with 3 days ago. Then this am he notes a pain when he abducts his right shoulder. The soreness is still in his right sided neck and shoulder.    He did undergo dialysis when he was last in the hospital. His renal function recovered to baseline and they were able to stop dialysis. He thinks that the plan is to give him a dialysis fistula at some point in the next few months and he is ok with this idea    He does have 5mg  baclofen at home although he has not used it.  He would be ok with trying this for his neck pain  He saw his nephrologist about a month ago and his understanding was that he was about at his usual baseline Wt Readings from Last 3 Encounters:  11/07/15 231 lb (104.781 kg)  10/30/15 229 lb 8 oz (104.101 kg)  10/15/15 227 lb 3.2 oz (103.057 kg)   He reports that his CHF nurse does call and check on him regularly and they keep a close watch on his weights   Patient Active Problem List   Diagnosis Date Noted  . Morbid obesity due to excess calories (HCC) 10/06/2015  . Snoring 10/06/2015  . Hypersomnia with sleep apnea 10/06/2015  . CHF (congestive heart failure), NYHA class I (HCC) 10/06/2015  . Chronic pain syndrome 09/29/2015  . CKD (chronic kidney disease), stage IV (HCC) 09/15/2015  . Type 2 diabetes mellitus with stage 4 chronic kidney disease, without long-term current use of insulin (HCC)   . Acute on chronic systolic CHF (congestive heart failure) (HCC)   . AKI (acute kidney  injury) (HCC)   . Acute kidney injury (HCC) 08/27/2015  . Arterial hypotension   . Hip pain 07/23/2015  . Acute exacerbation of CHF (congestive heart failure) (HCC)   . Dyspnea   . Acute renal failure superimposed on stage 3 chronic kidney disease (HCC) 07/01/2015  . Herpes ocular   . Facial rash 06/19/2015  . Essential hypertension 06/19/2015  . Chronic anemia 06/19/2015  . Non-ischemic cardiomyopathy- EF 15% echo Aug 2015 11/24/2014  . Chronic anticoagulation 11/24/2014  . S/P left THA, AA 09/12/2014  . Gout 04/29/2014  . Ocular herpes   . Neuropathy (HCC)   . OSA (obstructive sleep apnea) 07/09/2013  . Chronic systolic heart failure (HCC) 07/05/2013  . 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 PAF (paroxysmal atrial fibrillation) (HCC) 07/03/2013  . Hypertension   . COPD (chronic obstructive pulmonary disease) (HCC)   . CKD (chronic kidney disease), stage III   . Polysubstance abuse     Past Medical History  Diagnosis Date  . Atrial fibrillation (HCC)     PAF  . Hypertension   . COPD (chronic obstructive pulmonary disease) (HCC)   . Ocular herpes   . Gout   . CHF (congestive heart failure) (HCC)   . Chronic pain   . Hyperlipidemia   .  Nonischemic cardiomyopathy (HCC) Aug 2015    EF 15%  . Ocular herpes   . Neuropathy (HCC)   . Left-sided weakness     "because of the arthritis and edema"  . PVD (peripheral vascular disease) [facility]) May 2015    abnormal ABIs  . Memory impairment     "short term"  . CKD (chronic kidney disease), stage III     Dr. Briant Cedar follows-holding   . Fibromyalgia     neuropathy- hands, more than feet.  . Sleep apnea with use of continuous positive airway pressure (CPAP)     does not use cpap  . GERD (gastroesophageal reflux disease)     tums as needed  . Asthma   . Type II diabetes mellitus (HCC)   . Stroke [facility]) X 5    last stroke aug 2014"; denies residual on 09/12/2014  . Osteoarthritis   . Arthritis     "knees, right elbow,  shoulder" (09/12/2014)  . Shortness of breath dyspnea     Past Surgical History  Procedure Laterality Date  . 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 Tonsillectomy  age 57  . Esophagogastroduodenoscopy N/A 12/24/2013    Procedure: ESOPHAGOGASTRODUODENOSCOPY (EGD);  Surgeon: Louis Meckel, MD;  Location: [facility];  Service: Endoscopy;  Laterality: N/A;  . Colonoscopy N/A 12/24/2013    Procedure: COLONOSCOPY;  Surgeon: Louis Meckel, MD;  Location: [facility] ENDOSCOPY;  Service: Endoscopy;  Laterality: N/A;  . Joint replacement    . Cardiac catheterization  10/2006    normal coronary arteries;   Marland Kitchen Cataract extraction w/ intraocular lens  implant, bilateral Bilateral ~ 2008  . Total hip arthroplasty Left 09/12/2014    Procedure: LEFT TOTAL HIP ARTHROPLASTY ANTERIOR APPROACH;  Surgeon: [name], MD;  [facility];  Service: Orthopedics;  Laterality: Left;  . Right heart catheterization N/A 08/13/2014    Rt ht cath prior to hip surgery    Social History  Substance Use Topics  . Smoking status: Former Smoker -- 0.50 packs/day for 13 years    Types: Cigarettes    Quit date: 12/18/2006  . Smokeless tobacco: Never Used  . Alcohol Use: No     Comment: Quit 2008    Family History  Problem Relation Age of Onset  . Cancer Mother   . Hypertension Mother   . Lung disease Mother   . Diabetes Father   . Diabetes Sister   . Hypertension Sister   . Hypertension Brother   . Hypertension Sister   . Colon cancer Neg Hx     Allergies  Allergen Reactions  . Corlanor [Ivabradine] Palpitations and Other (See Comments)    Kidney and heart problems, syncope  . Entresto [Sacubitril-Valsartan]     Syncope  . Januvia [Sitagliptin] Other (See Comments)    "Almost killed me"   . Lunesta [Eszopiclone] Palpitations and Other (See Comments)    Kidney and heart problems, syncope  . Actos [Pioglitazone] Other (See Comments)    Caused blood in urine and fluid retention     Medication list has  been reviewed and updated.  Current Outpatient Prescriptions on File Prior to Visit  Medication Sig Dispense Refill  . albuterol (PROVENTIL HFA;VENTOLIN HFA) 108 (90 BASE) MCG/ACT inhaler Inhale 2 puffs into the lungs every 6 (six) hours as needed for wheezing or shortness of breath. 1 Inhaler 1  . allopurinol (ZYLOPRIM) 100 MG tablet Take 100 mg by mouth daily.    Marland Kitchen allopurinol (  ZYLOPRIM) 100 MG tablet TAKE 1 TABLET (100 MG TOTAL) BY MOUTH DAILY. 90 tablet 1  . apixaban (ELIQUIS) 5 MG TABS tablet Take 1 tablet (5 mg total) by mouth 2 (two) times daily. 180 tablet 3  . baclofen (LIORESAL) 10 MG tablet TAKE 1 TABLET EVERY 8 HOURS AS NEEDED FOR MUSCLE SPASM(S) 90 tablet 1  . carvedilol (COREG) 25 MG tablet Take 12.5 mg by mouth 2 (two) times daily with a meal.    . diclofenac (FLECTOR) 1.3 % PTCH Place 1 patch onto the skin 2 (two) times daily as needed (pain). 180 patch 3  . docusate sodium 100 MG CAPS Take 100 mg by mouth 2 (two) times daily. 10 capsule 0  . Fluticasone-Salmeterol (ADVAIR DISKUS) 250-50 MCG/DOSE AEPB Inhale 1 puff into the lungs 2 (two) times daily. 120 each 1  . glipiZIDE (GLUCOTROL) 5 MG tablet Take 5 mg by mouth 2 (two) times daily before a meal.    . hydrALAZINE (APRESOLINE) 50 MG tablet Take 1 tablet (50 mg total) by mouth 3 (three) times daily. 90 tablet 3  . isosorbide mononitrate (IMDUR) 60 MG 24 hr tablet Take 60 mg by mouth daily.    . metolazone (ZAROXOLYN) 2.5 MG tablet Take 1 tablet (2.5 mg total) by mouth 2 (two) times a week. Tuesday and Friday 15 tablet 3  . mometasone-formoterol (DULERA) 100-5 MCG/ACT AERO Inhale 2 puffs into the lungs 2 (two) times daily. 1 Inhaler prn  . omega-3 acid ethyl esters (LOVAZA) 1 G capsule Take 1 g by mouth daily.    Marland Kitchen oxyCODONE-acetaminophen (PERCOCET) 10-325 MG tablet Take 1 tablet by mouth 4 (four) times daily. Scheduled. Use with stool softerner. Refill on 09/18/2015 60 tablet 0  . polyethylene glycol powder (GLYCOLAX/MIRALAX)  powder MIX 1 CAPFUL (17GM)  IN  LIQUID  AND  DRINK EVERY DAY AS DIRECTED 1530 g 3  . potassium chloride SA (K-DUR,KLOR-CON) 20 MEQ tablet Take 2 tabs in AM and 1 tab in PM    . pregabalin (LYRICA) 75 MG capsule Take 1 capsule (75 mg total) by mouth daily. 90 capsule 3  . tiotropium (SPIRIVA HANDIHALER) 18 MCG inhalation capsule Place 1 capsule (18 mcg total) into inhaler and inhale daily. 90 capsule 2  . torsemide (DEMADEX) 20 MG tablet Take 4 tablets (80 mg total) by mouth 2 (two) times daily. 240 tablet 3   No current facility-administered medications on file prior to visit.    Review of Systems:  As per HPI- otherwise negative.   Physical Examination: Filed Vitals:   11/07/15 1417  BP: 102/76  Pulse: 117  Temp: 98.7 F (37.1 C)  Resp: 18   Filed Vitals:   11/07/15 1417  Height:  (1.778 m)  Weight: 231 lb (104.781 kg)   Body mass index is 33.15 kg/(m^2). Ideal Body Weight: Weight in (lb) to have BMI = 25: 173.9  GEN: WDWN, NAD, Non-toxic, A & O x 3, obese, looks well HEENT: Atraumatic, Normocephalic. Neck supple. No masses, No LAD.  Bilateral TM wnl, oropharynx normal.  PEERL,EOMI.   Ears and Nose: No external deformity. He has tenderness in the right anterior trapezius muscle.  His shoulder has normal ROM but he does have pain in the trapezius with full abduction or flexion.  No skin lesion or redness  Normal BUE strength and  CV: RRR, No M/G/R. No JVD. No thrill. No extra heart sounds. PULM: CTA B, no wheezes, crackles, rhonchi. No retractions. No resp. distress. No accessory  muscle use. EXTR: No c/c/e NEURO Normal gait.  PSYCH: Normally interactive. Conversant. Not depressed or anxious appearing.  Calm demeanor.    Assessment and Plan: Trapezius strain, right, initial encounter  Advised him that he can use the baclofen that he has at home as needed for his sore muscles, and also heat and gentle ROM. He needs me to do some PPW for some of his medications that  he gets through [facility] which I will take care of for him  Signed Abbe Amsterdam, MD

## 2015-11-07 NOTE — Patient Outreach (Signed)
[facility]) Care Management  [facility]   11/07/2015  EZECHIEL STOOKSBURY 04-24-1951 [id number]  Subjective: BRODRICK CURRAN is a 64 y.o. male. [facility] received a call reporting that the patient was out of all of his medications.  I called the patient to follow up and he denied this. He reported that he was out of his Advair but that he had never picked it up and started it in the first place.  He is working on paperwork to get his Lyrica through the drug company and needs Dr. Patsy Lager to sign off on the patient work.  He denies being out of any of his other medications or having any difficulties with them.  Objective:   Current Medications: Current Outpatient Prescriptions  Medication Sig Dispense Refill  . albuterol (PROVENTIL HFA;VENTOLIN HFA) 108 (90 BASE) MCG/ACT inhaler Inhale 2 puffs into the lungs every 6 (six) hours as needed for wheezing or shortness of breath. 1 Inhaler 1  . allopurinol (ZYLOPRIM) 100 MG tablet Take 100 mg by mouth daily.    Marland Kitchen allopurinol (ZYLOPRIM) 100 MG tablet TAKE 1 TABLET (100 MG TOTAL) BY MOUTH DAILY. 90 tablet 1  . apixaban (ELIQUIS) 5 MG TABS tablet Take 1 tablet (5 mg total) by mouth 2 (two) times daily. 180 tablet 3  . baclofen (LIORESAL) 10 MG tablet TAKE 1 TABLET EVERY 8 HOURS AS NEEDED FOR MUSCLE SPASM(S) 90 tablet 1  . carvedilol (COREG) 25 MG tablet Take 12.5 mg by mouth 2 (two) times daily with a meal.    . diclofenac (FLECTOR) 1.3 % PTCH Place 1 patch onto the skin 2 (two) times daily as needed (pain). 180 patch 3  . docusate sodium 100 MG CAPS Take 100 mg by mouth 2 (two) times daily. 10 capsule 0  . Fluticasone-Salmeterol (ADVAIR DISKUS) 250-50 MCG/DOSE AEPB Inhale 1 puff into the lungs 2 (two) times daily. 120 each 1  . glipiZIDE (GLUCOTROL) 5 MG tablet Take 5 mg by mouth 2 (two) times daily before a meal.    . hydrALAZINE (APRESOLINE) 50 MG tablet Take 1 tablet (50 mg total) by mouth 3 (three) times daily.  90 tablet 3  . isosorbide mononitrate (IMDUR) 60 MG 24 hr tablet Take 60 mg by mouth daily.    . metolazone (ZAROXOLYN) 2.5 MG tablet Take 1 tablet (2.5 mg total) by mouth 2 (two) times a week. Tuesday and Friday 15 tablet 3  . mometasone-formoterol (DULERA) 100-5 MCG/ACT AERO Inhale 2 puffs into the lungs 2 (two) times daily. 1 Inhaler prn  . omega-3 acid ethyl esters (LOVAZA) 1 G capsule Take 1 g by mouth daily.    Marland Kitchen oxyCODONE-acetaminophen (PERCOCET) 10-325 MG tablet Take 1 tablet by mouth 4 (four) times daily. Scheduled. Use with stool softerner. Refill on 09/18/2015 60 tablet 0  . polyethylene glycol powder (GLYCOLAX/MIRALAX) powder MIX 1 CAPFUL (17GM)  IN  LIQUID  AND  DRINK EVERY DAY AS DIRECTED 1530 g 3  . potassium chloride SA (K-DUR,KLOR-CON) 20 MEQ tablet Take 2 tabs in AM and 1 tab in PM    . pregabalin (LYRICA) 75 MG capsule Take 1 capsule (75 mg total) by mouth daily. 90 capsule 3  . tiotropium (SPIRIVA HANDIHALER) 18 MCG inhalation capsule Place 1 capsule (18 mcg total) into inhaler and inhale daily. 90 capsule 2  . torsemide (DEMADEX) 20 MG tablet Take 4 tablets (80 mg total) by mouth 2 (two) times daily. 240 tablet 3   No current  facility-administered medications for this visit.    Functional Status: In your present state of health, do you have any difficulty performing the following activities: 08/27/2015 07/31/2015  Hearing? N N  Vision? Y N  Difficulty concentrating or making decisions? N N  Walking or climbing stairs? N N  Dressing or bathing? N N  Doing errands, shopping? N Y  Quarry manager and eating ? - N  Using the Toilet? - N  In the past six months, have you accidently leaked urine? - N  Do you have problems with loss of bowel control? - N  Managing your Medications? - N  Managing your Finances? - N  Housekeeping or managing your Housekeeping? - Y    Fall/Depression Screening: PHQ 2/9 Scores 10/15/2015 09/12/2015 09/04/2015 08/27/2015 08/22/2015 08/12/2015  07/31/2015  PHQ - 2 Score 0 0 0 0 0 0 0    Assessment: 1. Medication assistance: patient has no urgent need for medication assistance.  Plan: 1. Medication assistance: will not open to [facility] or [facility] services at this time.   Juanita Craver, PharmD, BCPS Clinical Pharmacist [facility] [phone]

## 2015-11-07 NOTE — Patient Instructions (Signed)
It was good to see you today!  Continue to keep in touch with your CHF nurse regarding your weights and any symptoms.    I think you have a muscle pain in your neck.  Use the baclofen that you have at home- you can take 5mg  three times a day as needed but be careful of sedation.

## 2015-11-09 ENCOUNTER — Ambulatory Visit (INDEPENDENT_AMBULATORY_CARE_PROVIDER_SITE_OTHER): Payer: Commercial Managed Care - HMO | Admitting: Neurology

## 2015-11-09 DIAGNOSIS — I5023 Acute on chronic systolic (congestive) heart failure: Secondary | ICD-10-CM

## 2015-11-09 DIAGNOSIS — G4733 Obstructive sleep apnea (adult) (pediatric): Secondary | ICD-10-CM | POA: Diagnosis not present

## 2015-11-09 DIAGNOSIS — G4730 Sleep apnea, unspecified: Secondary | ICD-10-CM

## 2015-11-09 DIAGNOSIS — R0683 Snoring: Secondary | ICD-10-CM

## 2015-11-09 DIAGNOSIS — G4710 Hypersomnia, unspecified: Secondary | ICD-10-CM

## 2015-11-09 NOTE — Sleep Study (Signed)
Please see the scanned sleep study interpretation located in the Procedure tab within the Chart Review section. 

## 2015-11-10 ENCOUNTER — Ambulatory Visit: Payer: Commercial Managed Care - HMO | Admitting: Pulmonary Disease

## 2015-11-11 ENCOUNTER — Telehealth: Payer: Self-pay

## 2015-11-11 ENCOUNTER — Other Ambulatory Visit: Payer: Self-pay | Admitting: Family Medicine

## 2015-11-11 DIAGNOSIS — G894 Chronic pain syndrome: Secondary | ICD-10-CM

## 2015-11-11 DIAGNOSIS — G4731 Primary central sleep apnea: Secondary | ICD-10-CM

## 2015-11-11 MED ORDER — PREGABALIN 75 MG PO CAPS: 75.0000 mg | ORAL_CAPSULE | Freq: Every day | ORAL | Status: DC

## 2015-11-11 MED ORDER — DICLOFENAC EPOLAMINE 1.3 % TD PTCH: 1.0000 | MEDICATED_PATCH | Freq: Two times a day (BID) | TRANSDERMAL | Status: AC | PRN

## 2015-11-11 NOTE — Telephone Encounter (Signed)
Spoke to pt regarding his sleep study results. 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 There was prolonged hypoxemia noted in his study. The CPAP applied was ineffective. Dr. Vickey Huger recommends an URGENT RETURN for an attended, BiPAP titration study to optimize therapy. Pt states he will think about this. This will be his fourth study. I emphasized the importance of an attended bipap study based on this findings. Pt agreeable for me to place the order for bipap titration and pt knows that the sleep lab will call him to schedule study. Pt verbalized understanding.

## 2015-11-11 NOTE — Telephone Encounter (Signed)
REFILL REQUEST  oxyCODONE-acetaminophen (PERCOCET) 10-325 MG tablet   Dr Patsy Lager  [phone]

## 2015-11-12 MED ORDER — OXYCODONE-ACETAMINOPHEN 10-325 MG PO TABS: 1.0000 | ORAL_TABLET | Freq: Three times a day (TID) | ORAL | Status: DC | PRN

## 2015-11-12 NOTE — Telephone Encounter (Signed)
Did rx for him but changed from QID to TID prn.  Called pt and let him know that this is ready

## 2015-11-13 ENCOUNTER — Ambulatory Visit (INDEPENDENT_AMBULATORY_CARE_PROVIDER_SITE_OTHER): Payer: Commercial Managed Care - HMO

## 2015-11-13 ENCOUNTER — Ambulatory Visit (INDEPENDENT_AMBULATORY_CARE_PROVIDER_SITE_OTHER): Payer: Commercial Managed Care - HMO | Admitting: Family Medicine

## 2015-11-13 ENCOUNTER — Other Ambulatory Visit: Payer: Self-pay | Admitting: Physician Assistant

## 2015-11-13 ENCOUNTER — Telehealth: Payer: Self-pay

## 2015-11-13 VITALS — BP 112/80 | HR 106 | Temp 97.5°F | Resp 20 | Ht 70.0 in | Wt 233.0 lb

## 2015-11-13 DIAGNOSIS — J069 Acute upper respiratory infection, unspecified: Secondary | ICD-10-CM

## 2015-11-13 DIAGNOSIS — E871 Hypo-osmolality and hyponatremia: Secondary | ICD-10-CM

## 2015-11-13 DIAGNOSIS — I5023 Acute on chronic systolic (congestive) heart failure: Secondary | ICD-10-CM

## 2015-11-13 DIAGNOSIS — J029 Acute pharyngitis, unspecified: Secondary | ICD-10-CM

## 2015-11-13 DIAGNOSIS — R040 Epistaxis: Secondary | ICD-10-CM

## 2015-11-13 DIAGNOSIS — J441 Chronic obstructive pulmonary disease with (acute) exacerbation: Secondary | ICD-10-CM | POA: Diagnosis not present

## 2015-11-13 DIAGNOSIS — R0602 Shortness of breath: Secondary | ICD-10-CM

## 2015-11-13 LAB — POCT CBC
Granulocyte percent: 77.5 %G (ref 37–80)
HEMATOCRIT: 33.6 % — AB (ref 43.5–53.7)
Hemoglobin: 11.1 g/dL — AB (ref 14.1–18.1)
LYMPH, POC: 2.4 (ref 0.6–3.4)
MCH, POC: 27.8 pg (ref 27–31.2)
MCHC: 33.1 g/dL (ref 31.8–35.4)
MCV: 84.2 fL (ref 80–97)
MID (CBC): 0.4 (ref 0–0.9)
MPV: 7.9 fL (ref 0–99.8)
POC Granulocyte: 9.8 — AB (ref 2–6.9)
POC LYMPH %: 19 % (ref 10–50)
POC MID %: 3.5 %M (ref 0–12)
Platelet Count, POC: 246 10*3/uL (ref 142–424)
RBC: 3.99 M/uL — AB (ref 4.69–6.13)
RDW, POC: 17.6 %
WBC: 12.6 10*3/uL — AB (ref 4.6–10.2)

## 2015-11-13 LAB — POCT RAPID STREP A (OFFICE): RAPID STREP A SCREEN: NEGATIVE

## 2015-11-13 MED ORDER — ALBUTEROL SULFATE (2.5 MG/3ML) 0.083% IN NEBU
2.5000 mg | INHALATION_SOLUTION | Freq: Once | RESPIRATORY_TRACT | Status: AC
  Administered 2015-11-13: 2.5 mg via RESPIRATORY_TRACT

## 2015-11-13 MED ORDER — AMOXICILLIN-POT CLAVULANATE 875-125 MG PO TABS: 1.0000 | ORAL_TABLET | Freq: Two times a day (BID) | ORAL | Status: DC

## 2015-11-13 MED ORDER — PREDNISONE 20 MG PO TABS: ORAL_TABLET | ORAL | Status: DC

## 2015-11-13 NOTE — Telephone Encounter (Signed)
Dr. Patsy Lager, I am not sure if you would do this for pt.

## 2015-11-13 NOTE — Telephone Encounter (Signed)
He would like an inexpensive antibiotic for his illness. His symptoms are lots of congestion, running nose-green mucous. I let him know he needs an OV for this. His pharmacy is [facility] on [address]. CB # [phone]

## 2015-11-13 NOTE — Telephone Encounter (Signed)
Called him- he is sick with a runny nose and "slight fever."  He is very delicate.  Asked him to come in to be seen- he states that he does not have his co-pay. Advised him that we can work with him and to come in anyway

## 2015-11-13 NOTE — Progress Notes (Signed)
Subjective:    Patient ID: Frank Davidson, male    DOB: 01-03-1951, 64 y.o.   MRN: [id number]  11/13/2015  Sore Throat; Emesis; and Epistaxis   HPI This 64 y.o. male presents for evaluation of sore throat, emesis, L nare epistaxis.  Low grade fever 97.5.  No sweats; +chills.  +ST.  +sneezing; upset stomach.  No ear pain.  ST diffuse; pain with swallowing.  Taking Vicks vapor rub.  +rhinorrhea yellow-gray.  +nasal congestion. +PND yellow.  L nare with blood with clots and epistaxis today.  +coughing some; taking Mucinex.  +SOB worse than normal; worsening two days ago.  Weight has increased in past three weeks; drinking a lot of tea and soup.  Taking diuretic.  Followed by paramedicine.  +wheezing more.  Today was a different day.  Taking Nyquil and Mucinex generic equivalent. Does not have rescue inhaler.   Not taking Advair due to expense; Dr. Kriste Basque gave two samples of Advair.  Taking Zaroxolyn 2.5mg  on Tuesday and Friday.  Not taking Dulera.  Takes torsemide 20mg  eight daily.   More swollen tonight than usual.  Usually heart failure clinic recommends taking additional Zaroxolyn; pt took dose today.  To take one tomorrow as well.   No vomiting.  No diarrhea.  Denies chest pain.    Review of Systems  Constitutional: Positive for chills. Negative for fever, diaphoresis, activity change, appetite change and fatigue.  HENT: Positive for congestion, nosebleeds, postnasal drip, rhinorrhea, sinus pressure, sneezing and sore throat. Negative for ear discharge, ear pain, trouble swallowing and voice change.   Respiratory: Positive for cough, shortness of breath and wheezing.   Cardiovascular: Positive for leg swelling. Negative for chest pain and palpitations.  Gastrointestinal: Negative for nausea, vomiting, abdominal pain and diarrhea.  Endocrine: Negative for cold intolerance, heat intolerance, polydipsia, polyphagia and polyuria.  Skin: Negative for color change, rash and wound.  Neurological:  Negative for dizziness, tremors, seizures, syncope, facial asymmetry, speech difficulty, weakness, light-headedness, numbness and headaches.  Psychiatric/Behavioral: Negative for sleep disturbance and dysphoric mood. The patient is not nervous/anxious.     Past Medical History  Diagnosis Date  . Atrial fibrillation (HCC)     PAF  . Hypertension   . COPD (chronic obstructive pulmonary disease) (HCC)   . Ocular herpes   . Gout   . CHF (congestive heart failure) (HCC)   . Chronic pain   . Hyperlipidemia   . Nonischemic cardiomyopathy (HCC) Aug 2015    EF 15%  . Ocular herpes   . Neuropathy (HCC)   . Left-sided weakness     "because of the arthritis and edema"  . PVD (peripheral vascular disease) [facility]) May 2015    abnormal ABIs  . Memory impairment     "short term"  . CKD (chronic kidney disease), stage III     Dr. Briant Cedar follows-holding   . Fibromyalgia     neuropathy- hands, more than feet.  . Sleep apnea with use of continuous positive airway pressure (CPAP)     does not use cpap  . GERD (gastroesophageal reflux disease)     tums as needed  . Asthma   . Type II diabetes mellitus (HCC)   . Stroke [facility]) X 5    last stroke aug 2014"; denies residual on 09/12/2014  . Osteoarthritis   . Arthritis     "knees, right elbow, shoulder" (09/12/2014)  . Shortness of breath dyspnea    Past Surgical History  Procedure Laterality Date  .  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 Tonsillectomy  age 34  . Esophagogastroduodenoscopy N/A 12/24/2013    Procedure: ESOPHAGOGASTRODUODENOSCOPY (EGD);  Surgeon: Louis Meckel, MD;  Location: [facility];  Service: Endoscopy;  Laterality: N/A;  . Colonoscopy N/A 12/24/2013    Procedure: COLONOSCOPY;  Surgeon: Louis Meckel, MD;  Location: [facility] END[facility]COPY;  Service: Endoscopy;  Laterality: N/A;  . Joint replacement    . Cardiac catheterization  10/2006    normal coronary arteries;   Marland Kitchen Cataract extraction w/ intraocular lens  implant, bilateral  Bilateral ~ 2008  . Total hip arthroplasty Left 09/12/2014    Procedure: LEFT TOTAL HIP ARTHROPLASTY ANTERIOR APPROACH;  Surgeon: [name], MD;  Location: [facility] OR;  Service: Orthopedics;  Laterality: Left;  . Right heart catheterization N/A 08/13/2014    Rt ht cath prior to hip surgery   Allergies  Allergen Reactions  . Corlanor [Ivabradine] Palpitations and Other (See Comments)    Kidney and heart problems, syncope  . Entresto [Sacubitril-Valsartan]     Syncope  . Januvia [Sitagliptin] Other (See Comments)    "Almost killed me"   . Lunesta [Eszopiclone] Palpitations and Other (See Comments)    Kidney and heart problems, syncope  . Actos [Pioglitazone] Other (See Comments)    Caused blood in urine and fluid retention     Social History   Social History  . Marital Status: Divorced    Spouse Name: N/A  . Number of Children: N/A  . Years of Education: N/A   Occupational History  . Not on file.   Social History Main Topics  . Smoking status: Former Smoker -- 0.50 packs/day for 13 years    Types: Cigarettes    Quit date: 12/18/2006  . Smokeless tobacco: Never Used  . Alcohol Use: No     Comment: Quit 2008  . Drug Use: Yes    Special: Cocaine, Marijuana     Comment: last used marijuana and cocaine 2008  . Sexual Activity: Not on file   Other Topics Concern  . Not on file   Social History Narrative   Family History  Problem Relation Age of Onset  . Cancer Mother   . Hypertension Mother   . Lung disease Mother   . Diabetes Father   . Diabetes Sister   . Hypertension Sister   . Hypertension Brother   . Hypertension Sister   . Colon cancer Neg Hx        Objective:    BP 112/80 mmHg  Pulse 106  Temp(Src) 97.5 F (36.4 C) (Oral)  Resp 20  Ht 5\' 10"  (1.778 m)  Wt 233 lb (105.688 kg)  BMI 33.43 kg/m2  SpO2 96% Physical Exam  Constitutional: He is oriented to person, place, and time. He appears well-developed and well-nourished. No distress.  HENT:    Head: Normocephalic and atraumatic.  Right Ear: Tympanic membrane, external ear and ear canal normal.  Left Ear: Tympanic membrane, external ear and ear canal normal.  Nose: Mucosal edema and rhinorrhea present. Epistaxis is observed. Right sinus exhibits no maxillary sinus tenderness and no frontal sinus tenderness. Left sinus exhibits no maxillary sinus tenderness and no frontal sinus tenderness.  Mouth/Throat: Oropharynx is clear and moist. No oropharyngeal exudate.  L nare with residual blood; no active bleeding; septal area with inflamed mucosa.  Eyes: Conjunctivae and EOM are normal. Pupils are equal, round, and reactive to light.  Neck: Normal range of motion. Neck supple. Carotid bruit  is not present. No thyromegaly present.  Cardiovascular: Normal rate, regular rhythm, normal heart sounds and intact distal pulses.  Exam reveals no gallop and no friction rub.   No murmur heard. Pulmonary/Chest: Effort normal. He has wheezes. He has no rales.  Distant breath sounds; moderate air movement throughout; no tachypnea; speaking complete sentences.  Abdominal: Soft. Bowel sounds are normal. He exhibits no distension and no mass. There is no tenderness. There is no rebound and no guarding.  +abdominal swelling.  Minimal leg swelling.  Lymphadenopathy:    He has no cervical adenopathy.  Neurological: He is alert and oriented to person, place, and time. No cranial nerve deficit.  Skin: Skin is warm and dry. No rash noted. He is not diaphoretic.  Psychiatric: He has a normal mood and affect. His behavior is normal.  Nursing note and vitals reviewed.   EKG: first degree heart block; PVCs.  [facility] reading (PRIMARY) by  Dr. Katrinka Blazing. CXR: NAD; +CARDIOMEGALY UNCHANGED; SCARRING IN R FISSURE CHRONIC.  NO PULMONARY EDEMA OR INFILTRATE.  ALBUTEROL NEBULIZER ADMINISTERED.  L NARE PACKED WITH VASELINE GAUZE.    Assessment & Plan:   1. Acute upper respiratory infection   2. Sore throat   3. Acute on  chronic systolic congestive heart failure (HCC)   4. COPD exacerbation (HCC)   5. Epistaxis   6. Shortness of breath   7. Hyponatremia    1. URI/sore throat: New. Due to comorbidities, will treat with Augmentin.  Recommend nasal saline to nose bid.   2.  COPD exacerbation: New.  Secondary to acute illness; s/p Albuterol nebulizer in office. Rx for Prednisone; non-compliant with inhalers due to expense of medications.  Need to coordinate nebulizer to home which may be a more affordable option for patient. 3.  Acute CHF exacerbation: New. Patient took additional diuretic today; advised to call cardiology/CHF clinic in am and advise of weight and that warranted additional diuretic; obtain labs. 4.  Epistaxis L nare: New. Associated with recent illness; maintained on anticoagulation.  L nare packed with vaseline gauze. 5.  SOB: New. Multifactorial due to COPD exacerbation and CHF exacerbation. Close follow-up for persistence; to ED for acute worsening.   Orders Placed This Encounter  Procedures  . Culture, Group A Strep  . DG Chest 2 View    Standing Status: Future     Number of Occurrences: 1     Standing Expiration Date: 11/12/2016    Order Specific Question:  Reason for Exam (SYMPTOM  OR DIAGNOSIS REQUIRED)    Answer:  shortness of breath, known CHF, COPD exacerbation    Order Specific Question:  Preferred imaging location?    Answer:  External  . Comprehensive metabolic panel  . CBC with Differential/Platelet    Standing Status: Future     Number of Occurrences:      Standing Expiration Date: 11/17/2016  . Comprehensive metabolic panel    Standing Status: Future     Number of Occurrences:      Standing Expiration Date: 11/17/2016  . POCT CBC  . POCT rapid strep A  . EKG 12-Lead   Meds ordered this encounter  Medications  . albuterol (PROVENTIL) (2.5 MG/3ML) 0.083% nebulizer solution 2.5 mg    Sig:   . amoxicillin-clavulanate (AUGMENTIN) 875-125 MG tablet    Sig: Take 1  tablet by mouth 2 (two) times daily.    Dispense:  20 tablet    Refill:  0  . predniSONE (DELTASONE) 20 MG tablet  Sig: Three tablets daily x 2 days then two tablets daily x 5 days then one tablet daily x 5 days    Dispense:  21 tablet    Refill:  0    No Follow-up on file.    Letonya Mangels Paulita Fujita, M.D. [facility] [address] [place], [place]  [address] [phone] phone [phone] fax

## 2015-11-14 LAB — COMPREHENSIVE METABOLIC PANEL
ALT: 16 U/L (ref 9–46)
AST: 28 U/L (ref 10–35)
Albumin: 4.3 g/dL (ref 3.6–5.1)
Alkaline Phosphatase: 73 U/L (ref 40–115)
BUN: 92 mg/dL — AB (ref 7–25)
CALCIUM: 9.7 mg/dL (ref 8.6–10.3)
CHLORIDE: 87 mmol/L — AB (ref 98–110)
CO2: 28 mmol/L (ref 20–31)
Creat: 3.38 mg/dL — ABNORMAL HIGH (ref 0.70–1.25)
GLUCOSE: 104 mg/dL — AB (ref 65–99)
POTASSIUM: 3.6 mmol/L (ref 3.5–5.3)
Sodium: 129 mmol/L — ABNORMAL LOW (ref 135–146)
Total Bilirubin: 0.5 mg/dL (ref 0.2–1.2)
Total Protein: 8.5 g/dL — ABNORMAL HIGH (ref 6.1–8.1)

## 2015-11-15 LAB — CULTURE, GROUP A STREP: ORGANISM ID, BACTERIA: NORMAL

## 2015-11-19 ENCOUNTER — Encounter (HOSPITAL_COMMUNITY): Payer: Commercial Managed Care - HMO

## 2015-11-19 ENCOUNTER — Ambulatory Visit: Payer: Commercial Managed Care - HMO | Admitting: Surgery

## 2015-11-19 ENCOUNTER — Other Ambulatory Visit (HOSPITAL_COMMUNITY): Payer: Commercial Managed Care - HMO

## 2015-11-21 ENCOUNTER — Telehealth: Payer: Self-pay | Admitting: Family Medicine

## 2015-11-21 DIAGNOSIS — J441 Chronic obstructive pulmonary disease with (acute) exacerbation: Secondary | ICD-10-CM

## 2015-11-21 NOTE — Telephone Encounter (Signed)
Left message for pt to call back  °

## 2015-11-21 NOTE — Telephone Encounter (Signed)
1.  Please make referral to [facility] or [facility] for nebulizer delivery; please order nebulizer with supplies and Albuterol nebulizer solution. Dx: COPD.  2. Also, I wanted patient to return for repeat labs this week; please call him to remind him of this. 3. Lastly, what is his weight today?  4. How is his wheezing and breathing?

## 2015-11-24 ENCOUNTER — Other Ambulatory Visit (HOSPITAL_COMMUNITY): Payer: Self-pay | Admitting: Internal Medicine

## 2015-11-24 NOTE — Telephone Encounter (Signed)
Spoke with pt, advised meaage from Dr. Katrinka Blazing. He states the he received a call from the people who were trying to get him the nebulizer machine. I advised him to call them back to set that up. He states he weighs 230 lbs at this time. He is still having some wheezing but feels ok. He will follow up Wed or Thurs for the labs.

## 2015-11-24 NOTE — Telephone Encounter (Signed)
Noted.  No further action at this time; will await lab results.

## 2015-11-25 ENCOUNTER — Telehealth (HOSPITAL_COMMUNITY): Payer: Self-pay

## 2015-11-25 NOTE — Telephone Encounter (Signed)
Pt called CHF triage line c/o weight up 5 lbs since the holidays and SOB.  States he tried taking extra fluid pills (unsure how much or which ones) and did not get any extra output or weight loss.  Hesitant to further instruct patient on diuretics since last Cr was >3 and he has taken extra without any further result. Patient states he has an apt with his PCP tomorrow and will see if he can check his kidney function tomorrow and look him over.  Patient will call and update [place] of his apt tomorrow and we will go from there on how to further advise patient. If swelling and/or breathing becomes worse tonight patient agrees to go to [facility] ED.  Ave Filter

## 2015-11-26 ENCOUNTER — Ambulatory Visit (INDEPENDENT_AMBULATORY_CARE_PROVIDER_SITE_OTHER): Payer: Commercial Managed Care - HMO | Admitting: Emergency Medicine

## 2015-11-26 VITALS — BP 132/80 | HR 92 | Temp 98.2°F | Resp 17 | Ht 70.0 in | Wt 234.0 lb

## 2015-11-26 DIAGNOSIS — E871 Hypo-osmolality and hyponatremia: Secondary | ICD-10-CM | POA: Diagnosis not present

## 2015-11-26 DIAGNOSIS — E876 Hypokalemia: Secondary | ICD-10-CM | POA: Diagnosis not present

## 2015-11-26 DIAGNOSIS — I428 Other cardiomyopathies: Secondary | ICD-10-CM

## 2015-11-26 DIAGNOSIS — I429 Cardiomyopathy, unspecified: Secondary | ICD-10-CM | POA: Diagnosis not present

## 2015-11-26 DIAGNOSIS — I10 Essential (primary) hypertension: Secondary | ICD-10-CM

## 2015-11-26 DIAGNOSIS — J449 Chronic obstructive pulmonary disease, unspecified: Secondary | ICD-10-CM | POA: Diagnosis not present

## 2015-11-26 DIAGNOSIS — N184 Chronic kidney disease, stage 4 (severe): Secondary | ICD-10-CM

## 2015-11-26 LAB — COMPREHENSIVE METABOLIC PANEL
ALBUMIN: 4.1 g/dL (ref 3.6–5.1)
ALK PHOS: 75 U/L (ref 40–115)
ALT: 47 U/L — ABNORMAL HIGH (ref 9–46)
AST: 28 U/L (ref 10–35)
BILIRUBIN TOTAL: 0.5 mg/dL (ref 0.2–1.2)
BUN: 113 mg/dL — ABNORMAL HIGH (ref 7–25)
CALCIUM: 10.7 mg/dL — AB (ref 8.6–10.3)
CO2: 32 mmol/L — ABNORMAL HIGH (ref 20–31)
CREATININE: 3.04 mg/dL — AB (ref 0.70–1.25)
Chloride: 91 mmol/L — ABNORMAL LOW (ref 98–110)
Glucose, Bld: 108 mg/dL — ABNORMAL HIGH (ref 65–99)
Potassium: 3.4 mmol/L — ABNORMAL LOW (ref 3.5–5.3)
SODIUM: 138 mmol/L (ref 135–146)
TOTAL PROTEIN: 7.9 g/dL (ref 6.1–8.1)

## 2015-11-26 LAB — CBC WITH DIFFERENTIAL/PLATELET
Basophils Absolute: 0 10*3/uL (ref 0.0–0.1)
Basophils Relative: 0 % (ref 0–1)
Eosinophils Absolute: 0.4 10*3/uL (ref 0.0–0.7)
Eosinophils Relative: 4 % (ref 0–5)
HCT: 37.2 % — ABNORMAL LOW (ref 39.0–52.0)
Hemoglobin: 11.6 g/dL — ABNORMAL LOW (ref 13.0–17.0)
Lymphocytes Relative: 24 % (ref 12–46)
Lymphs Abs: 2.3 10*3/uL (ref 0.7–4.0)
MCH: 27.2 pg (ref 26.0–34.0)
MCHC: 31.2 g/dL (ref 30.0–36.0)
MCV: 87.3 fL (ref 78.0–100.0)
MPV: 11.1 fL (ref 8.6–12.4)
Monocytes Absolute: 0.9 10*3/uL (ref 0.1–1.0)
Monocytes Relative: 10 % (ref 3–12)
Neutro Abs: 5.8 10*3/uL (ref 1.7–7.7)
Neutrophils Relative %: 62 % (ref 43–77)
Platelets: 220 10*3/uL (ref 150–400)
RBC: 4.26 MIL/uL (ref 4.22–5.81)
RDW: 19.1 % — ABNORMAL HIGH (ref 11.5–15.5)
WBC: 9.4 10*3/uL (ref 4.0–10.5)

## 2015-11-26 LAB — POTASSIUM: POTASSIUM: 3.4 mmol/L — AB (ref 3.5–5.3)

## 2015-11-26 MED ORDER — ALBUTEROL SULFATE (2.5 MG/3ML) 0.083% IN NEBU: 2.5000 mg | INHALATION_SOLUTION | RESPIRATORY_TRACT | Status: AC | PRN

## 2015-11-26 NOTE — Telephone Encounter (Signed)
[facility] called. There is no sig on the albuterol neb. Need to know the albuterol strength, how often (and it can't just be prn due to ins), and how many RF's (they can only dispense one month at a time)  CB #: [phone]  Thanks

## 2015-11-26 NOTE — Patient Instructions (Signed)
Hyponatremia °Hyponatremia is when the amount of salt (sodium) in your blood is too low. When sodium levels are low, your cells absorb extra water and they swell. The swelling happens throughout the body, but it mostly affects the brain. °CAUSES °This condition may be caused by: °· Heart, kidney, or liver problems. °· Thyroid problems. °· Adrenal gland problems. °· Metabolic conditions, such as syndrome of inappropriate antidiuretic hormone (SIADH). °· Severe vomiting and diarrhea. °· Certain medicines or illegal drugs. °· Dehydration. °· Drinking too much water. °· Eating a diet that is low in sodium. °· Large burns on your body. °· Sweating. °RISK FACTORS °This condition is more likely to develop in people who: °· Have long-term (chronic) kidney disease. °· Have heart failure. °· Have a medical condition that causes frequent or excessive diarrhea. °· Have metabolic conditions, such as Addison disease or SIADH. °· Take certain medicines that affect the sodium and fluid balance in the blood. Some of these medicine types include: °¨ Diuretics. °¨ NSAIDs. °¨ Some opioid pain medicines. °¨ Some antidepressants. °¨ Some seizure prevention medicines. °SYMPTOMS  °Symptoms of this condition include: °· Nausea and vomiting. °· Confusion. °· Lethargy. °· Agitation. °· Headache. °· Seizures. °· Unconsciousness. °· Appetite loss. °· Muscle weakness and cramping. °· Feeling weak or light-headed. °· Having a rapid heart rate. °· Fainting, in severe cases. °DIAGNOSIS °This condition is diagnosed with a medical history and physical exam. You will also have other tests, including: °· Blood tests. °· Urine tests. °TREATMENT °Treatment for this condition depends on the cause. Treatment may include: °· Fluids given through an IV tube that is inserted into one of your veins. °· Medicines to correct the sodium imbalance. If medicines are causing the condition, the medicines will need to be adjusted. °· Limiting water or fluid intake to  get the correct sodium balance. °HOME CARE INSTRUCTIONS °· Take medicines only as directed by your health care provider. Many medicines can make this condition worse. Talk with your health care provider about any medicines that you are currently taking. °· Carefully follow a recommended diet as directed by your health care provider. °· Carefully follow instructions from your health care provider about fluid restrictions. °· Keep all follow-up visits as directed by your health care provider. This is important. °· Do not drink alcohol. °SEEK MEDICAL CARE IF: °· You develop worsening nausea, fatigue, headache, confusion, or weakness. °· Your symptoms go away and then return. °· You have problems following the recommended diet. °SEEK IMMEDIATE MEDICAL CARE IF: °· You have a seizure. °· You faint. °· You have ongoing diarrhea or vomiting. °  °This information is not intended to replace advice given to you by your health care provider. Make sure you discuss any questions you have with your health care provider. °  °Document Released: 10/29/2002 Document Revised: 03/25/2015 Document Reviewed: 11/28/2014 °Elsevier Interactive Patient Education ©2016 [employer] ° ° ° °

## 2015-11-26 NOTE — Progress Notes (Signed)
Subjective:  Patient ID: Frank Davidson, male    DOB: 1950-12-14  Age: 65 y.o. MRN: [id number]  CC: Follow-up   HPI Frank Davidson presents   Patient has chronic kidney disease and hyponatremia and was seen by Dr. Katrinka Blazing several days ago she wanted and recommended have lab work done she's not here will be in the office until tomorrow he said that he has little peripheral edema. His shortness of breath is  Improved. He has no further orthopnea or paroxysmal  Nocturnaldyspnea. He has no chest pain or other complaints  History Frank Davidson has a past medical history of Atrial fibrillation (HCC); Hypertension; COPD (chronic obstructive pulmonary disease) (HCC); Ocular herpes; Gout; CHF (congestive heart failure) (HCC); Chronic pain; Hyperlipidemia; Nonischemic cardiomyopathy [facility]) (Aug 2015); Ocular herpes; Neuropathy (HCC); Left-sided weakness; PVD (peripheral vascular disease) [facility]) (May 2015); Memory impairment; CKD (chronic kidney disease), stage III; Fibromyalgia; Sleep apnea with use of continuous positive airway pressure (CPAP); GERD (gastroesophageal reflux disease); Asthma; Type II diabetes mellitus (HCC); Stroke (HCC) (X 5); Osteoarthritis; Arthritis; and Shortness of breath dyspnea.   He has past surgical history that includes Orchiectomy (Left); Tonsillectomy (age 78); Esophagogastroduodenoscopy (N/A, 12/24/2013); Colonoscopy (N/A, 12/24/2013); Joint replacement; Cardiac catheterization (10/2006); Cataract extraction w/ intraocular lens  implant, bilateral (Bilateral, ~ 2008); Total hip arthroplasty (Left, 09/12/2014); and right heart catheterization (N/A, 08/13/2014).   His  family history includes Cancer in his mother; Diabetes in his father and sister; Hypertension in his brother, mother, sister, and sister; Lung disease in his mother. There is no history of Colon cancer.  He   reports that he quit smoking about 8 years ago. His smoking use included Cigarettes. He has a 6.5 pack-year  smoking history. He has never used smokeless tobacco. He reports that he uses illicit drugs (Cocaine and Marijuana). He reports that he does not drink alcohol.  Outpatient Prescriptions Prior to Visit  Medication Sig Dispense Refill  . albuterol (PROVENTIL HFA;VENTOLIN HFA) 108 (90 BASE) MCG/ACT inhaler Inhale 2 puffs into the lungs every 6 (six) hours as needed for wheezing or shortness of breath. 1 Inhaler 1  . allopurinol (ZYLOPRIM) 100 MG tablet TAKE 1 TABLET (100 MG TOTAL) BY MOUTH DAILY. 90 tablet 1  . amoxicillin-clavulanate (AUGMENTIN) 875-125 MG tablet Take 1 tablet by mouth 2 (two) times daily. 20 tablet 0  . apixaban (ELIQUIS) 5 MG TABS tablet Take 1 tablet (5 mg total) by mouth 2 (two) times daily. 180 tablet 3  . baclofen (LIORESAL) 10 MG tablet TAKE 1 TABLET EVERY 8 HOURS AS NEEDED FOR MUSCLE SPASM(S) 90 tablet 1  . carvedilol (COREG) 25 MG tablet Take 12.5 mg by mouth 2 (two) times daily with a meal.    . diclofenac (FLECTOR) 1.3 % PTCH Place 1 patch onto the skin 2 (two) times daily as needed (pain). 180 patch 3  . docusate sodium 100 MG CAPS Take 100 mg by mouth 2 (two) times daily. 10 capsule 0  . Fluticasone-Salmeterol (ADVAIR DISKUS) 250-50 MCG/DOSE AEPB Inhale 1 puff into the lungs 2 (two) times daily. 120 each 1  . glipiZIDE (GLUCOTROL) 5 MG tablet Take 5 mg by mouth 2 (two) times daily before a meal.    . hydrALAZINE (APRESOLINE) 50 MG tablet Take 1 tablet (50 mg total) by mouth 3 (three) times daily. 90 tablet 3  . isosorbide mononitrate (IMDUR) 60 MG 24 hr tablet Take 60 mg by mouth daily.    . metolazone (ZAROXOLYN) 2.5 MG tablet Take 1 tablet (  2.5 mg total) by mouth 2 (two) times a week. Tuesday and Friday 15 tablet 3  . mometasone-formoterol (DULERA) 100-5 MCG/ACT AERO Inhale 2 puffs into the lungs 2 (two) times daily. 1 Inhaler prn  . omega-3 acid ethyl esters (LOVAZA) 1 G capsule Take 1 g by mouth daily.    Marland Kitchen oxyCODONE-acetaminophen (PERCOCET) 10-325 MG tablet Take 1  tablet by mouth every 8 (eight) hours as needed for pain. Use with stool softerner. 60 tablet 0  . polyethylene glycol powder (GLYCOLAX/MIRALAX) powder MIX 1 CAPFUL (17GM)  IN  LIQUID  AND  DRINK EVERY DAY AS DIRECTED 1530 g 3  . potassium chloride SA (K-DUR,KLOR-CON) 20 MEQ tablet Take 2 tabs in AM and 1 tab in PM    . predniSONE (DELTASONE) 20 MG tablet Three tablets daily x 2 days then two tablets daily x 5 days then one tablet daily x 5 days 21 tablet 0  . pregabalin (LYRICA) 75 MG capsule Take 1 capsule (75 mg total) by mouth daily. 90 capsule 3  . tiotropium (SPIRIVA HANDIHALER) 18 MCG inhalation capsule Place 1 capsule (18 mcg total) into inhaler and inhale daily. 90 capsule 2  . torsemide (DEMADEX) 20 MG tablet TAKE 4 TABLETS (80MG )  TWICE DAILY (CHANGE IN DOSE) 720 tablet 3   No facility-administered medications prior to visit.    Social History   Social History  . Marital Status: Divorced    Spouse Name: N/A  . Number of Children: N/A  . Years of Education: N/A   Social History Main Topics  . Smoking status: Former Smoker -- 0.50 packs/day for 13 years    Types: Cigarettes    Quit date: 12/18/2006  . Smokeless tobacco: Never Used  . Alcohol Use: No     Comment: Quit 2008  . Drug Use: Yes    Special: Cocaine, Marijuana     Comment: last used marijuana and cocaine 2008  . Sexual Activity: No   Other Topics Concern  . None   Social History Narrative     Review of Systems  Constitutional: Negative for fever, chills and appetite change.  HENT: Negative for congestion, ear pain, postnasal drip, sinus pressure and sore throat.   Eyes: Negative for pain and redness.  Respiratory: Positive for shortness of breath (improved). Negative for cough and wheezing.   Cardiovascular: Negative for leg swelling.  Gastrointestinal: Negative for nausea, vomiting, abdominal pain, diarrhea, constipation and blood in stool.  Endocrine: Negative for polyuria.  Genitourinary: Negative  for dysuria, urgency, frequency and flank pain.  Musculoskeletal: Negative for gait problem.  Skin: Negative for rash.  Neurological: Negative for weakness and headaches.  Psychiatric/Behavioral: Negative for confusion and decreased concentration. The patient is not nervous/anxious.     Objective:  BP 132/80 mmHg  Pulse 92  Temp(Src) 98.2 F (36.8 C) (Oral)  Resp 17  Ht 5\' 10"  (1.778 m)  Wt 234 lb (106.142 kg)  BMI 33.58 kg/m2  SpO2 95%  Physical Exam  Constitutional: He is oriented to person, place, and time. He appears well-developed and well-nourished. No distress.  HENT:  Head: Normocephalic and atraumatic.  Right Ear: External ear normal.  Left Ear: External ear normal.  Nose: Nose normal.  Eyes: Conjunctivae and EOM are normal. Pupils are equal, round, and reactive to light. No scleral icterus.  Neck: Normal range of motion. Neck supple. No tracheal deviation present.  Cardiovascular: Normal rate, regular rhythm and normal heart sounds.   Pulmonary/Chest: Effort normal. No respiratory distress. He  has no wheezes. He has no rales.  Abdominal: He exhibits no mass. There is no tenderness. There is no rebound and no guarding.  Musculoskeletal: He exhibits no edema.  Lymphadenopathy:    He has no cervical adenopathy.  Neurological: He is alert and oriented to person, place, and time. Coordination normal.  Skin: Skin is warm and dry. No rash noted.  Psychiatric: He has a normal mood and affect. His behavior is normal.      Assessment & Plan:   Frank Davidson was seen today for follow-up.  Diagnoses and all orders for this visit:  Non-ischemic cardiomyopathy- EF 15% echo Aug 2015  Hypokalemia -     Potassium  Hyponatremia -     CBC with Differential/Platelet -     Comprehensive metabolic panel  Essential hypertension  Chronic obstructive pulmonary disease, unspecified COPD type (HCC)  CKD (chronic kidney disease), stage IV (HCC)  Other orders -     albuterol  (PROVENTIL) (2.5 MG/3ML) 0.083% nebulizer solution; Take 3 mLs (2.5 mg total) by nebulization every 4 (four) hours as needed for wheezing or shortness of breath.   I am having [name] start on albuterol. I am also having him maintain his DSS, apixaban, albuterol, omega-3 acid ethyl esters, polyethylene glycol powder, isosorbide mononitrate, carvedilol, hydrALAZINE, baclofen, Fluticasone-Salmeterol, tiotropium, glipiZIDE, metolazone, potassium chloride SA, allopurinol, mometasone-formoterol, pregabalin, diclofenac, oxyCODONE-acetaminophen, amoxicillin-clavulanate, predniSONE, and torsemide.  Meds ordered this encounter  Medications  . albuterol (PROVENTIL) (2.5 MG/3ML) 0.083% nebulizer solution    Sig: Take 3 mLs (2.5 mg total) by nebulization every 4 (four) hours as needed for wheezing or shortness of breath.    Dispense:  150 vial    Refill:  5    he'll follow Dr. Katrinka Blazing by phone  Appropriate red flag conditions were discussed with the patient as well as actions that should be taken.  Patient expressed his understanding.  Follow-up: No Follow-up on file.  Carmelina Dane, MD

## 2015-11-26 NOTE — Telephone Encounter (Signed)
Looks like Albuterol nebulizer solution sent to pharmacy earlier today with 5 refills.  What further needs to be completed?

## 2015-11-27 NOTE — Telephone Encounter (Signed)
Called and advised pharmacy the sig.

## 2015-11-28 ENCOUNTER — Telehealth: Payer: Self-pay | Admitting: *Deleted

## 2015-11-28 ENCOUNTER — Telehealth: Payer: Self-pay

## 2015-11-28 DIAGNOSIS — T502X5S Adverse effect of carbonic-anhydrase inhibitors, benzothiadiazides and other diuretics, sequela: Secondary | ICD-10-CM

## 2015-11-28 NOTE — Telephone Encounter (Signed)
Faxed signed order to [facility] pertaining to oximetry testing, per Dr Katrinka Blazing. Confirmation page received at 3:07 pm.

## 2015-11-28 NOTE — Telephone Encounter (Signed)
Patient is calling to request a refill for percocet

## 2015-12-02 MED ORDER — POTASSIUM CHLORIDE CRYS ER 20 MEQ PO TBCR: EXTENDED_RELEASE_TABLET | ORAL | Status: DC

## 2015-12-02 NOTE — Telephone Encounter (Signed)
Noted  

## 2015-12-02 NOTE — Telephone Encounter (Signed)
Called him regarding K level - he is taking torsemide daily and the metolazone 3x a week currently.  He just ran out of his K yesterday.  Will send a RF to his local [facility].  He is seeing Dr. Jeffie Pollock in 9 days and will request a K level then.  Also he needs a RF of his percocet- will RF this for him tomorrow  Results for orders placed or performed in visit on 11/26/15  Potassium  Result Value Ref Range   Potassium 3.4 (L) 3.5 - 5.3 mmol/L  CBC with Differential/Platelet  Result Value Ref Range   WBC 9.4 4.0 - 10.5 K/uL   RBC 4.26 4.22 - 5.81 MIL/uL   Hemoglobin 11.6 (L) 13.0 - 17.0 g/dL   HCT 37.2 (L) 39.0 - 52.0 %   MCV 87.3 78.0 - 100.0 fL   MCH 27.2 26.0 - 34.0 pg   MCHC 31.2 30.0 - 36.0 g/dL   RDW 19.1 (H) 11.5 - 15.5 %   Platelets 220 150 - 400 K/uL   MPV 11.1 8.6 - 12.4 fL   Neutrophils Relative % 62 43 - 77 %   Neutro Abs 5.8 1.7 - 7.7 K/uL   Lymphocytes Relative 24 12 - 46 %   Lymphs Abs 2.3 0.7 - 4.0 K/uL   Monocytes Relative 10 3 - 12 %   Monocytes Absolute 0.9 0.1 - 1.0 K/uL   Eosinophils Relative 4 0 - 5 %   Eosinophils Absolute 0.4 0.0 - 0.7 K/uL   Basophils Relative 0 0 - 1 %   Basophils Absolute 0.0 0.0 - 0.1 K/uL   Smear Review Criteria for review not met   Comprehensive metabolic panel  Result Value Ref Range   Sodium 138 135 - 146 mmol/L   Potassium 3.4 (L) 3.5 - 5.3 mmol/L   Chloride 91 (L) 98 - 110 mmol/L   CO2 32 (H) 20 - 31 mmol/L   Glucose, Bld 108 (H) 65 - 99 mg/dL   BUN 113 (H) 7 - 25 mg/dL   Creat 3.04 (H) 0.70 - 1.25 mg/dL   Total Bilirubin 0.5 0.2 - 1.2 mg/dL   Alkaline Phosphatase 75 40 - 115 U/L   AST 28 10 - 35 U/L   ALT 47 (H) 9 - 46 U/L   Total Protein 7.9 6.1 - 8.1 g/dL   Albumin 4.1 3.6 - 5.1 g/dL   Calcium 10.7 (H) 8.6 - 10.3 mg/dL

## 2015-12-03 ENCOUNTER — Other Ambulatory Visit: Payer: Self-pay | Admitting: Family Medicine

## 2015-12-03 DIAGNOSIS — G894 Chronic pain syndrome: Secondary | ICD-10-CM

## 2015-12-03 MED ORDER — OXYCODONE-ACETAMINOPHEN 10-325 MG PO TABS: 1.0000 | ORAL_TABLET | Freq: Three times a day (TID) | ORAL | Status: DC | PRN

## 2015-12-04 ENCOUNTER — Encounter: Payer: Self-pay | Admitting: Pulmonary Disease

## 2015-12-08 ENCOUNTER — Telehealth: Payer: Self-pay | Admitting: Family Medicine

## 2015-12-08 NOTE — Telephone Encounter (Signed)
Called and LM with male who answered phone that his flector patches arrived at my office.  We had recently completed several pages of PPW for the [employer] pt assistance program and I then received a letter that they were not able to fulfill his rx request by mail- however as his meds have arrived will disregard this letter

## 2015-12-10 ENCOUNTER — Ambulatory Visit: Payer: Commercial Managed Care - HMO | Admitting: Pulmonary Disease

## 2015-12-11 ENCOUNTER — Inpatient Hospital Stay (HOSPITAL_COMMUNITY)
Admission: EM | Admit: 2015-12-11 | Discharge: 2015-12-13 | DRG: 291 | Disposition: A | Discharge status: Home / Self Care | Payer: Commercial Managed Care - HMO | Attending: Internal Medicine | Admitting: Internal Medicine

## 2015-12-11 ENCOUNTER — Encounter (HOSPITAL_COMMUNITY): Payer: Self-pay | Admitting: Emergency Medicine

## 2015-12-11 DIAGNOSIS — E1140 Type 2 diabetes mellitus with diabetic neuropathy, unspecified: Secondary | ICD-10-CM | POA: Diagnosis present

## 2015-12-11 DIAGNOSIS — E1151 Type 2 diabetes mellitus with diabetic peripheral angiopathy without gangrene: Secondary | ICD-10-CM | POA: Diagnosis present

## 2015-12-11 DIAGNOSIS — I429 Cardiomyopathy, unspecified: Secondary | ICD-10-CM | POA: Diagnosis present

## 2015-12-11 DIAGNOSIS — M109 Gout, unspecified: Secondary | ICD-10-CM | POA: Diagnosis present

## 2015-12-11 DIAGNOSIS — G4733 Obstructive sleep apnea (adult) (pediatric): Secondary | ICD-10-CM | POA: Diagnosis present

## 2015-12-11 DIAGNOSIS — Z7901 Long term (current) use of anticoagulants: Secondary | ICD-10-CM

## 2015-12-11 DIAGNOSIS — I480 Paroxysmal atrial fibrillation: Secondary | ICD-10-CM | POA: Diagnosis present

## 2015-12-11 DIAGNOSIS — R0602 Shortness of breath: Secondary | ICD-10-CM | POA: Diagnosis not present

## 2015-12-11 DIAGNOSIS — E669 Obesity, unspecified: Secondary | ICD-10-CM | POA: Diagnosis present

## 2015-12-11 DIAGNOSIS — I509 Heart failure, unspecified: Secondary | ICD-10-CM

## 2015-12-11 DIAGNOSIS — Z7984 Long term (current) use of oral hypoglycemic drugs: Secondary | ICD-10-CM

## 2015-12-11 DIAGNOSIS — K219 Gastro-esophageal reflux disease without esophagitis: Secondary | ICD-10-CM | POA: Diagnosis present

## 2015-12-11 DIAGNOSIS — M797 Fibromyalgia: Secondary | ICD-10-CM | POA: Diagnosis present

## 2015-12-11 DIAGNOSIS — E785 Hyperlipidemia, unspecified: Secondary | ICD-10-CM | POA: Diagnosis present

## 2015-12-11 DIAGNOSIS — Z6831 Body mass index (BMI) 31.0-31.9, adult: Secondary | ICD-10-CM

## 2015-12-11 DIAGNOSIS — N184 Chronic kidney disease, stage 4 (severe): Secondary | ICD-10-CM | POA: Diagnosis present

## 2015-12-11 DIAGNOSIS — I5023 Acute on chronic systolic (congestive) heart failure: Secondary | ICD-10-CM | POA: Diagnosis present

## 2015-12-11 DIAGNOSIS — Z96642 Presence of left artificial hip joint: Secondary | ICD-10-CM | POA: Diagnosis present

## 2015-12-11 DIAGNOSIS — J45909 Unspecified asthma, uncomplicated: Secondary | ICD-10-CM | POA: Diagnosis present

## 2015-12-11 DIAGNOSIS — E11649 Type 2 diabetes mellitus with hypoglycemia without coma: Secondary | ICD-10-CM | POA: Diagnosis present

## 2015-12-11 DIAGNOSIS — J9811 Atelectasis: Secondary | ICD-10-CM | POA: Diagnosis present

## 2015-12-11 DIAGNOSIS — I130 Hypertensive heart and chronic kidney disease with heart failure and stage 1 through stage 4 chronic kidney disease, or unspecified chronic kidney disease: Principal | ICD-10-CM | POA: Diagnosis present

## 2015-12-11 DIAGNOSIS — G8929 Other chronic pain: Secondary | ICD-10-CM | POA: Diagnosis present

## 2015-12-11 DIAGNOSIS — E1122 Type 2 diabetes mellitus with diabetic chronic kidney disease: Secondary | ICD-10-CM | POA: Diagnosis present

## 2015-12-11 DIAGNOSIS — Z888 Allergy status to other drugs, medicaments and biological substances status: Secondary | ICD-10-CM

## 2015-12-11 DIAGNOSIS — J449 Chronic obstructive pulmonary disease, unspecified: Secondary | ICD-10-CM | POA: Diagnosis present

## 2015-12-11 DIAGNOSIS — Z87891 Personal history of nicotine dependence: Secondary | ICD-10-CM

## 2015-12-11 DIAGNOSIS — Z8673 Personal history of transient ischemic attack (TIA), and cerebral infarction without residual deficits: Secondary | ICD-10-CM

## 2015-12-11 DIAGNOSIS — I10 Essential (primary) hypertension: Secondary | ICD-10-CM | POA: Diagnosis present

## 2015-12-11 NOTE — ED Notes (Signed)
Per ems-- pt with hx of chf, COPD and stage 3 renal disease. Pt c.o sob despite home neb and inhaler worse with laying down. Also reports 5lb weight gain since yesterday. sts abdomen feels distended.

## 2015-12-12 ENCOUNTER — Encounter: Payer: Self-pay | Admitting: Family Medicine

## 2015-12-12 ENCOUNTER — Emergency Department (HOSPITAL_COMMUNITY): Payer: Commercial Managed Care - HMO

## 2015-12-12 DIAGNOSIS — Z87891 Personal history of nicotine dependence: Secondary | ICD-10-CM | POA: Diagnosis not present

## 2015-12-12 DIAGNOSIS — I509 Heart failure, unspecified: Secondary | ICD-10-CM | POA: Insufficient documentation

## 2015-12-12 DIAGNOSIS — Z7901 Long term (current) use of anticoagulants: Secondary | ICD-10-CM

## 2015-12-12 DIAGNOSIS — R0602 Shortness of breath: Secondary | ICD-10-CM | POA: Diagnosis present

## 2015-12-12 DIAGNOSIS — Z888 Allergy status to other drugs, medicaments and biological substances status: Secondary | ICD-10-CM | POA: Diagnosis not present

## 2015-12-12 DIAGNOSIS — I429 Cardiomyopathy, unspecified: Secondary | ICD-10-CM | POA: Diagnosis present

## 2015-12-12 DIAGNOSIS — I480 Paroxysmal atrial fibrillation: Secondary | ICD-10-CM | POA: Diagnosis present

## 2015-12-12 DIAGNOSIS — M797 Fibromyalgia: Secondary | ICD-10-CM | POA: Diagnosis present

## 2015-12-12 DIAGNOSIS — N184 Chronic kidney disease, stage 4 (severe): Secondary | ICD-10-CM | POA: Diagnosis not present

## 2015-12-12 DIAGNOSIS — K219 Gastro-esophageal reflux disease without esophagitis: Secondary | ICD-10-CM | POA: Diagnosis present

## 2015-12-12 DIAGNOSIS — E1151 Type 2 diabetes mellitus with diabetic peripheral angiopathy without gangrene: Secondary | ICD-10-CM | POA: Diagnosis present

## 2015-12-12 DIAGNOSIS — I130 Hypertensive heart and chronic kidney disease with heart failure and stage 1 through stage 4 chronic kidney disease, or unspecified chronic kidney disease: Secondary | ICD-10-CM | POA: Diagnosis present

## 2015-12-12 DIAGNOSIS — E11649 Type 2 diabetes mellitus with hypoglycemia without coma: Secondary | ICD-10-CM | POA: Diagnosis present

## 2015-12-12 DIAGNOSIS — I10 Essential (primary) hypertension: Secondary | ICD-10-CM

## 2015-12-12 DIAGNOSIS — Z8673 Personal history of transient ischemic attack (TIA), and cerebral infarction without residual deficits: Secondary | ICD-10-CM | POA: Diagnosis not present

## 2015-12-12 DIAGNOSIS — M109 Gout, unspecified: Secondary | ICD-10-CM | POA: Diagnosis present

## 2015-12-12 DIAGNOSIS — E669 Obesity, unspecified: Secondary | ICD-10-CM | POA: Diagnosis present

## 2015-12-12 DIAGNOSIS — Z96642 Presence of left artificial hip joint: Secondary | ICD-10-CM | POA: Diagnosis present

## 2015-12-12 DIAGNOSIS — E1122 Type 2 diabetes mellitus with diabetic chronic kidney disease: Secondary | ICD-10-CM

## 2015-12-12 DIAGNOSIS — J45909 Unspecified asthma, uncomplicated: Secondary | ICD-10-CM | POA: Diagnosis present

## 2015-12-12 DIAGNOSIS — G4733 Obstructive sleep apnea (adult) (pediatric): Secondary | ICD-10-CM | POA: Diagnosis present

## 2015-12-12 DIAGNOSIS — G8929 Other chronic pain: Secondary | ICD-10-CM | POA: Diagnosis present

## 2015-12-12 DIAGNOSIS — E1140 Type 2 diabetes mellitus with diabetic neuropathy, unspecified: Secondary | ICD-10-CM | POA: Diagnosis present

## 2015-12-12 DIAGNOSIS — E785 Hyperlipidemia, unspecified: Secondary | ICD-10-CM | POA: Diagnosis present

## 2015-12-12 DIAGNOSIS — I5023 Acute on chronic systolic (congestive) heart failure: Secondary | ICD-10-CM | POA: Diagnosis not present

## 2015-12-12 DIAGNOSIS — J449 Chronic obstructive pulmonary disease, unspecified: Secondary | ICD-10-CM | POA: Diagnosis present

## 2015-12-12 DIAGNOSIS — J9811 Atelectasis: Secondary | ICD-10-CM | POA: Diagnosis present

## 2015-12-12 DIAGNOSIS — Z7984 Long term (current) use of oral hypoglycemic drugs: Secondary | ICD-10-CM | POA: Diagnosis not present

## 2015-12-12 DIAGNOSIS — Z6831 Body mass index (BMI) 31.0-31.9, adult: Secondary | ICD-10-CM | POA: Diagnosis not present

## 2015-12-12 LAB — GLUCOSE, CAPILLARY
GLUCOSE-CAPILLARY: 139 mg/dL — AB (ref 65–99)
GLUCOSE-CAPILLARY: 85 mg/dL (ref 65–99)
GLUCOSE-CAPILLARY: 107 mg/dL — AB (ref 65–99)
Glucose-Capillary: 78 mg/dL (ref 65–99)
Glucose-Capillary: 92 mg/dL (ref 65–99)

## 2015-12-12 LAB — BASIC METABOLIC PANEL
ANION GAP: 15 (ref 5–15)
Anion gap: 11 (ref 5–15)
BUN: 32 mg/dL — AB (ref 6–20)
BUN: 33 mg/dL — AB (ref 6–20)
CALCIUM: 10.1 mg/dL (ref 8.9–10.3)
CO2: 26 mmol/L (ref 22–32)
CO2: 30 mmol/L (ref 22–32)
CREATININE: 2.37 mg/dL — AB (ref 0.61–1.24)
Calcium: 10.2 mg/dL (ref 8.9–10.3)
Chloride: 97 mmol/L — ABNORMAL LOW (ref 101–111)
Chloride: 98 mmol/L — ABNORMAL LOW (ref 101–111)
Creatinine, Ser: 2.44 mg/dL — ABNORMAL HIGH (ref 0.61–1.24)
GFR calc Af Amer: 31 mL/min — ABNORMAL LOW (ref >60)
GFR, EST AFRICAN AMERICAN: 30 mL/min — AB (ref >60)
GFR, EST NON AFRICAN AMERICAN: 26 mL/min — AB (ref >60)
GFR, EST NON AFRICAN AMERICAN: 27 mL/min — AB (ref >60)
GLUCOSE: 83 mg/dL (ref 65–99)
Glucose, Bld: 85 mg/dL (ref 65–99)
POTASSIUM: 3.4 mmol/L — AB (ref 3.5–5.1)
POTASSIUM: 4.5 mmol/L (ref 3.5–5.1)
SODIUM: 138 mmol/L (ref 135–145)
SODIUM: 139 mmol/L (ref 135–145)

## 2015-12-12 LAB — URINALYSIS, ROUTINE W REFLEX MICROSCOPIC
BILIRUBIN URINE: NEGATIVE
Glucose, UA: NEGATIVE mg/dL
HGB URINE DIPSTICK: NEGATIVE
Ketones, ur: NEGATIVE mg/dL
Leukocytes, UA: NEGATIVE
Nitrite: NEGATIVE
Protein, ur: NEGATIVE mg/dL
SPECIFIC GRAVITY, URINE: 1.01 (ref 1.005–1.030)
pH: 7.5 (ref 5.0–8.0)

## 2015-12-12 LAB — CBC
HCT: 37.4 % — ABNORMAL LOW (ref 39.0–52.0)
HEMOGLOBIN: 11.8 g/dL — AB (ref 13.0–17.0)
MCH: 28 pg (ref 26.0–34.0)
MCHC: 31.6 g/dL (ref 30.0–36.0)
MCV: 88.8 fL (ref 78.0–100.0)
PLATELETS: 271 10*3/uL (ref 150–400)
RBC: 4.21 MIL/uL — ABNORMAL LOW (ref 4.22–5.81)
RDW: 17.9 % — ABNORMAL HIGH (ref 11.5–15.5)
WBC: 6.1 10*3/uL (ref 4.0–10.5)

## 2015-12-12 LAB — LIPID PANEL
CHOL/HDL RATIO: 3.4 ratio
Cholesterol: 157 mg/dL (ref 0–200)
HDL: 46 mg/dL (ref >40)
LDL CALC: 81 mg/dL (ref 0–99)
Triglycerides: 149 mg/dL (ref <150)
VLDL: 30 mg/dL (ref 0–40)

## 2015-12-12 LAB — I-STAT TROPONIN, ED: TROPONIN I, POC: 0.06 ng/mL (ref 0.00–0.08)

## 2015-12-12 LAB — MRSA PCR SCREENING: MRSA by PCR: NEGATIVE

## 2015-12-12 LAB — BRAIN NATRIURETIC PEPTIDE: B NATRIURETIC PEPTIDE 5: 1024 pg/mL — AB (ref 0.0–100.0)

## 2015-12-12 LAB — TSH: TSH: 2.482 u[IU]/mL (ref 0.350–4.500)

## 2015-12-12 MED ORDER — BACLOFEN 10 MG PO TABS
10.0000 mg | ORAL_TABLET | Freq: Three times a day (TID) | ORAL | Status: DC | PRN
  Administered 2015-12-13: 10 mg via ORAL
  Filled 2015-12-12 (×2): qty 1

## 2015-12-12 MED ORDER — DOCUSATE SODIUM 100 MG PO CAPS
100.0000 mg | ORAL_CAPSULE | Freq: Two times a day (BID) | ORAL | Status: DC
  Administered 2015-12-12 – 2015-12-13 (×3): 100 mg via ORAL
  Filled 2015-12-12 (×3): qty 1

## 2015-12-12 MED ORDER — SODIUM CHLORIDE 0.9 % IJ SOLN: 3.0000 mL | INTRAMUSCULAR | Status: DC | PRN

## 2015-12-12 MED ORDER — DICLOFENAC EPOLAMINE 1.3 % TD PTCH
1.0000 | MEDICATED_PATCH | Freq: Two times a day (BID) | TRANSDERMAL | Status: DC | PRN
  Filled 2015-12-12: qty 1

## 2015-12-12 MED ORDER — ALLOPURINOL 100 MG PO TABS
100.0000 mg | ORAL_TABLET | Freq: Every day | ORAL | Status: DC
  Administered 2015-12-12 – 2015-12-13 (×2): 100 mg via ORAL
  Filled 2015-12-12 (×2): qty 1

## 2015-12-12 MED ORDER — FUROSEMIDE 10 MG/ML IJ SOLN
80.0000 mg | Freq: Once | INTRAMUSCULAR | Status: AC
  Administered 2015-12-12: 80 mg via INTRAVENOUS
  Filled 2015-12-12: qty 8

## 2015-12-12 MED ORDER — OMEGA-3-ACID ETHYL ESTERS 1 G PO CAPS
1.0000 g | ORAL_CAPSULE | Freq: Every day | ORAL | Status: DC
  Administered 2015-12-12 – 2015-12-13 (×2): 1 g via ORAL
  Filled 2015-12-12 (×2): qty 1

## 2015-12-12 MED ORDER — OXYCODONE HCL 5 MG PO TABS
5.0000 mg | ORAL_TABLET | Freq: Three times a day (TID) | ORAL | Status: DC | PRN
  Administered 2015-12-12 (×2): 5 mg via ORAL
  Filled 2015-12-12 (×2): qty 1

## 2015-12-12 MED ORDER — POTASSIUM CHLORIDE CRYS ER 20 MEQ PO TBCR: 20.0000 meq | EXTENDED_RELEASE_TABLET | Freq: Every day | ORAL | Status: DC

## 2015-12-12 MED ORDER — CARVEDILOL 12.5 MG PO TABS
12.5000 mg | ORAL_TABLET | Freq: Two times a day (BID) | ORAL | Status: DC
  Administered 2015-12-12 – 2015-12-13 (×3): 12.5 mg via ORAL
  Filled 2015-12-12 (×3): qty 1

## 2015-12-12 MED ORDER — INSULIN ASPART 100 UNIT/ML ~~LOC~~ SOLN
0.0000 [IU] | SUBCUTANEOUS | Status: DC
  Administered 2015-12-12: 1 [IU] via SUBCUTANEOUS

## 2015-12-12 MED ORDER — INSULIN ASPART 100 UNIT/ML ~~LOC~~ SOLN: 0.0000 [IU] | Freq: Three times a day (TID) | SUBCUTANEOUS | Status: DC

## 2015-12-12 MED ORDER — HYDRALAZINE HCL 50 MG PO TABS
50.0000 mg | ORAL_TABLET | Freq: Three times a day (TID) | ORAL | Status: DC
  Administered 2015-12-12 – 2015-12-13 (×4): 50 mg via ORAL
  Filled 2015-12-12 (×4): qty 1

## 2015-12-12 MED ORDER — ISOSORBIDE MONONITRATE ER 60 MG PO TB24
60.0000 mg | ORAL_TABLET | Freq: Every day | ORAL | Status: DC
Start: 2015-12-12 — End: 2015-12-13
  Administered 2015-12-12 – 2015-12-13 (×2): 60 mg via ORAL
  Filled 2015-12-12 (×2): qty 1

## 2015-12-12 MED ORDER — APIXABAN 5 MG PO TABS
5.0000 mg | ORAL_TABLET | Freq: Two times a day (BID) | ORAL | Status: DC
  Administered 2015-12-12 – 2015-12-13 (×3): 5 mg via ORAL
  Filled 2015-12-12 (×3): qty 1

## 2015-12-12 MED ORDER — ALBUTEROL SULFATE (2.5 MG/3ML) 0.083% IN NEBU: 2.5000 mg | INHALATION_SOLUTION | RESPIRATORY_TRACT | Status: DC | PRN

## 2015-12-12 MED ORDER — FUROSEMIDE 10 MG/ML IJ SOLN
60.0000 mg | Freq: Once | INTRAMUSCULAR | Status: AC
  Administered 2015-12-12: 60 mg via INTRAVENOUS
  Filled 2015-12-12: qty 6

## 2015-12-12 MED ORDER — SODIUM CHLORIDE 0.9 % IJ SOLN
3.0000 mL | Freq: Two times a day (BID) | INTRAMUSCULAR | Status: DC
  Administered 2015-12-12 – 2015-12-13 (×3): 3 mL via INTRAVENOUS

## 2015-12-12 MED ORDER — ACETAMINOPHEN 325 MG PO TABS: 650.0000 mg | ORAL_TABLET | ORAL | Status: DC | PRN

## 2015-12-12 MED ORDER — SODIUM CHLORIDE 0.9 % IV SOLN: 250.0000 mL | INTRAVENOUS | Status: DC | PRN

## 2015-12-12 MED ORDER — GLIPIZIDE 5 MG PO TABS: 5.0000 mg | ORAL_TABLET | Freq: Two times a day (BID) | ORAL | Status: DC

## 2015-12-12 MED ORDER — POTASSIUM CHLORIDE CRYS ER 20 MEQ PO TBCR: 40.0000 meq | EXTENDED_RELEASE_TABLET | Freq: Every day | ORAL | Status: DC

## 2015-12-12 MED ORDER — OXYCODONE-ACETAMINOPHEN 5-325 MG PO TABS
1.0000 | ORAL_TABLET | Freq: Three times a day (TID) | ORAL | Status: DC | PRN
  Administered 2015-12-12 – 2015-12-13 (×3): 1 via ORAL
  Filled 2015-12-12 (×3): qty 1

## 2015-12-12 MED ORDER — ALBUTEROL SULFATE HFA 108 (90 BASE) MCG/ACT IN AERS: 2.0000 | INHALATION_SPRAY | Freq: Four times a day (QID) | RESPIRATORY_TRACT | Status: DC | PRN

## 2015-12-12 MED ORDER — ONDANSETRON HCL 4 MG/2ML IJ SOLN: 4.0000 mg | Freq: Four times a day (QID) | INTRAMUSCULAR | Status: DC | PRN

## 2015-12-12 MED ORDER — METOLAZONE 2.5 MG PO TABS
2.5000 mg | ORAL_TABLET | ORAL | Status: DC
  Administered 2015-12-12: 2.5 mg via ORAL
  Filled 2015-12-12: qty 1

## 2015-12-12 MED ORDER — OXYCODONE-ACETAMINOPHEN 10-325 MG PO TABS: 1.0000 | ORAL_TABLET | Freq: Three times a day (TID) | ORAL | Status: DC | PRN

## 2015-12-12 MED ORDER — TIOTROPIUM BROMIDE MONOHYDRATE 18 MCG IN CAPS
18.0000 ug | ORAL_CAPSULE | Freq: Every day | RESPIRATORY_TRACT | Status: DC
  Administered 2015-12-13: 18 ug via RESPIRATORY_TRACT
  Filled 2015-12-12: qty 5

## 2015-12-12 MED ORDER — PREGABALIN 75 MG PO CAPS
75.0000 mg | ORAL_CAPSULE | Freq: Every day | ORAL | Status: DC
  Administered 2015-12-12 – 2015-12-13 (×2): 75 mg via ORAL
  Filled 2015-12-12 (×2): qty 1

## 2015-12-12 MED ORDER — POTASSIUM CHLORIDE CRYS ER 20 MEQ PO TBCR
40.0000 meq | EXTENDED_RELEASE_TABLET | Freq: Two times a day (BID) | ORAL | Status: AC
Start: 2015-12-12 — End: 2015-12-13
  Administered 2015-12-12 – 2015-12-13 (×3): 40 meq via ORAL
  Filled 2015-12-12 (×3): qty 2

## 2015-12-12 NOTE — Consult Note (Signed)
Advanced Heart Failure Team Consult Note  Referring Physician: Dr Susie Cassette PCP: Dr. Arthor Captain Nephrologist: Dr. Briant Cedar HF: Bensimhon  Reason for Consultation: A/C systolic HF  HPI:    Frank Davidson is a 65 y.o. male with hx of obesity, PAF, HTN, COPD, HTN, gout, DM 2, CVA and CHF EF 25% due to NICM. Last seen in HF clinic 10/30/15, at that time volume status was stable on high dose torsemide with metolazone twice a week (up to three times as needed). We had strongly encouraged him to get a shunt placed in preparation for dialysis as recommended by Dr Briant Cedar.   He presented to [facility] 12/11/15 via EMS complaining of SOB despite home nebs x 2, 5 lb weight gain overnight, and + orthopnea. Pertinent admission labs include K 4.5, Creatinine 2.44, BNP 1024, and flat troponins. CXR showed vascular congestion with mild cardiomegaly with mild bilateral atelectasis. UO ok over past 12 hours, though he has received total of 140 mg IV lasix.   Feels much better this am. Already down 5 lbs. States he has not been reseen for shunt placement or Bipap titration. He was sick for several weeks with a "bad cold" and was treated with clindamycin and prednisone taper, which he finished last week. Has been taking all of his medications as directed. Took 2 metolazone yesterday.  Of note, on Wednesday night had several helpings of "homemade chicken soup". He states he doesn't add salt but his sister made this.  Drinks about 1.5 L of fluid daily.  Weight had been as low as 218 at home, trended up to 226 at max. 220 this am.   Review of Systems: [y] = yes, [ ]  = no   General: Weight gain [y]; Weight loss [ ] ; Anorexia [ ] ; Fatigue [ ] ; Fever [ ] ; Chills [ ] ; Weakness [ ]   Cardiac: Chest pain/pressure [ ] ; Resting SOB [ ] ; Exertional SOB [ ] ; Orthopnea [y]; Pedal Edema [y]; Palpitations [ ] ; Syncope [ ] ; Presyncope [ ] ; Paroxysmal nocturnal dyspnea[ ]   Pulmonary: Cough [y]; Wheezing[ ] ; Hemoptysis[ ] ; Sputum [ ] ;  Snoring [ ]   GI: Vomiting[ ] ; Dysphagia[ ] ; Melena[ ] ; Hematochezia [ ] ; Heartburn[ ] ; Abdominal pain [ ] ; Constipation [ ] ; Diarrhea [ ] ; BRBPR [ ]   GU: Hematuria[ ] ; Dysuria [ ] ; Nocturia[ ]   Vascular: Pain in legs with walking [ ] ; Pain in feet with lying flat [ ] ; Non-healing sores [ ] ; Stroke [ ] ; TIA [ ] ; Slurred speech [ ] ;  Neuro: Headaches[ ] ; Vertigo[ ] ; Seizures[ ] ; Paresthesias[ ] ;Blurred vision [ ] ; Diplopia [ ] ; Vision changes [ ]   Ortho/Skin: Arthritis [y]; Joint pain [y]; Muscle pain [ ] ; Joint swelling [ ] ; Back Pain [ ] ; Rash [ ]   Psych: Depression[ ] ; Anxiety[ ]   Heme: Bleeding problems [ ] ; Clotting disorders [ ] ; Anemia [ ]   Endocrine: Diabetes [ ] ; Thyroid dysfunction[ ]   Home Medications Prior to Admission medications   Medication Sig Start Date End Date Taking? Authorizing Provider  albuterol (PROVENTIL HFA;VENTOLIN HFA) 108 (90 BASE) MCG/ACT inhaler Inhale 2 puffs into the lungs every 6 (six) hours as needed for wheezing or shortness of breath. 06/30/15  Yes Gwenlyn Found Copland, MD  albuterol (PROVENTIL) (2.5 MG/3ML) 0.083% nebulizer solution Take 3 mLs (2.5 mg total) by nebulization every 4 (four) hours as needed for wheezing or shortness of breath. 11/26/15  Yes Carmelina Dane, MD  allopurinol (ZYLOPRIM) 100 MG tablet TAKE 1 TABLET (100  MG TOTAL) BY MOUTH DAILY. 10/30/15  Yes Gwenlyn Found Copland, MD  apixaban (ELIQUIS) 5 MG TABS tablet Take 1 tablet (5 mg total) by mouth 2 (two) times daily. 02/04/15  Yes Dolores Patty, MD  baclofen (LIORESAL) 10 MG tablet TAKE 1 TABLET EVERY 8 HOURS AS NEEDED FOR MUSCLE SPASM(S) 09/16/15  Yes Gwenlyn Found Copland, MD  carvedilol (COREG) 25 MG tablet Take 12.5 mg by mouth 2 (two) times daily with a meal.   Yes Historical Provider, MD  diclofenac (FLECTOR) 1.3 % PTCH Place 1 patch onto the skin 2 (two) times daily as needed (pain). 11/11/15  Yes Gwenlyn Found Copland, MD  docusate sodium 100 MG CAPS Take 100 mg by mouth 2 (two) times daily.  09/16/14  Yes Matthew Babish, PA-C  glipiZIDE (GLUCOTROL) 5 MG tablet Take 5 mg by mouth 2 (two) times daily before a meal.   Yes Historical Provider, MD  hydrALAZINE (APRESOLINE) 50 MG tablet Take 1 tablet (50 mg total) by mouth 3 (three) times daily. 09/15/15  Yes Laurey Morale, MD  isosorbide mononitrate (IMDUR) 60 MG 24 hr tablet Take 60 mg by mouth daily.   Yes Historical Provider, MD  metolazone (ZAROXOLYN) 2.5 MG tablet Take 1 tablet (2.5 mg total) by mouth 2 (two) times a week. Tuesday and Friday 09/29/15  Yes Dolores Patty, MD  omega-3 acid ethyl esters (LOVAZA) 1 G capsule Take 1 g by mouth daily.   Yes Historical Provider, MD  oxyCODONE-acetaminophen (PERCOCET) 10-325 MG tablet Take 1 tablet by mouth every 8 (eight) hours as needed for pain. Use with stool softerner. 12/03/15  Yes Jessica C Copland, MD  polyethylene glycol powder (GLYCOLAX/MIRALAX) powder MIX 1 CAPFUL (17GM)  IN  LIQUID  AND  DRINK EVERY DAY AS DIRECTED 07/31/15  Yes Gwenlyn Found Copland, MD  potassium chloride SA (K-DUR,KLOR-CON) 20 MEQ tablet Take 2 tabs in AM and 1 tab in PM 12/02/15  Yes Jessica C Copland, MD  pregabalin (LYRICA) 75 MG capsule Take 1 capsule (75 mg total) by mouth daily. 11/11/15  Yes Gwenlyn Found Copland, MD  tiotropium (SPIRIVA HANDIHALER) 18 MCG inhalation capsule Place 1 capsule (18 mcg total) into inhaler and inhale daily. 09/29/15  Yes Michele Mcalpine, MD  torsemide (DEMADEX) 20 MG tablet TAKE 4 TABLETS ( )  TWICE DAILY (CHANGE IN DOSE) 11/26/15  Yes Dolores Patty, MD    Past Medical History: Past Medical History  Diagnosis Date  . Atrial fibrillation (HCC)     PAF  . Hypertension   . COPD (chronic obstructive pulmonary disease) (HCC)   . Ocular herpes   . Gout   . CHF (congestive heart failure) (HCC)   . Chronic pain   . Hyperlipidemia   . Nonischemic cardiomyopathy (HCC) Aug 2015    EF 15%  . Ocular herpes   . Neuropathy (HCC)   . Left-sided weakness     "because of the arthritis  and edema"  . PVD (peripheral vascular disease) [facility]) May 2015    abnormal ABIs  . Memory impairment     "short term"  . CKD (chronic kidney disease), stage III     Dr. Briant Cedar follows-holding   . Fibromyalgia     neuropathy- hands, more than feet.  . Sleep apnea with use of continuous positive airway pressure (CPAP)     does not use cpap  . GERD (gastroesophageal reflux disease)     tums as needed  . Asthma   . Type II  diabetes mellitus (HCC)   . Stroke [facility]) X 5    last stroke aug 2014"; denies residual on 09/12/2014  . Osteoarthritis   . Arthritis     "knees, right elbow, shoulder" (09/12/2014)  . Shortness of breath dyspnea     Past Surgical History: Past Surgical History  Procedure Laterality Date  . 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 Tonsillectomy  age 12  . Esophagogastroduodenoscopy N/A 12/24/2013    Procedure: ESOPHAGOGASTRODUODENOSCOPY (EGD);  Surgeon: Louis Meckel, MD;  Location: [facility];  Service: Endoscopy;  Laterality: N/A;  . Colonoscopy N/A 12/24/2013    Procedure: COLONOSCOPY;  Surgeon: Louis Meckel, MD;  Location: [facility] ENDOSCOPY;  Service: Endoscopy;  Laterality: N/A;  . Joint replacement    . Cardiac catheterization  10/2006    normal coronary arteries;   Marland Kitchen Cataract extraction w/ intraocular lens  implant, bilateral Bilateral ~ 2008  . Total hip arthroplasty Left 09/12/2014    Procedure: LEFT TOTAL HIP ARTHROPLASTY ANTERIOR APPROACH;  Surgeon: [name], MD;  Location: [facility] OR;  Service: Orthopedics;  Laterality: Left;  . Right heart catheterization N/A 08/13/2014    Rt ht cath prior to hip surgery    Family History: Family History  Problem Relation Age of Onset  . Cancer Mother   . Hypertension Mother   . Lung disease Mother   . Diabetes Father   . Diabetes Sister   . Hypertension Sister   . Hypertension Brother   . Hypertension Sister   . Colon cancer Neg Hx     Social History: Social History   Social History  . Marital  Status: Divorced    Spouse Name: N/A  . Number of Children: N/A  . Years of Education: N/A   Social History Main Topics  . Smoking status: Former Smoker -- 0.50 packs/day for 13 years    Types: Cigarettes    Quit date: 12/18/2006  . Smokeless tobacco: Never Used  . Alcohol Use: No     Comment: Quit 2008  . Drug Use: Yes    Special: Cocaine, Marijuana     Comment: last used marijuana and cocaine 2008  . Sexual Activity: No   Other Topics Concern  . None   Social History Narrative    Allergies:  Allergies  Allergen Reactions  . Corlanor [Ivabradine] Palpitations and Other (See Comments)    Kidney and heart problems, syncope  . Entresto [Sacubitril-Valsartan]     Syncope  . Januvia [Sitagliptin] Other (See Comments)    "Almost killed me"   . Lunesta [Eszopiclone] Palpitations and Other (See Comments)    Kidney and heart problems, syncope  . Actos [Pioglitazone] Other (See Comments)    Caused blood in urine and fluid retention     Objective:    Vital Signs:   Temp:  [97.1 F (36.2 C)-97.6 F (36.4 C)] 97.4 F (36.3 C) (01/20 1610) Pulse Rate:  [58-89] 71 (01/20 0638) Resp:  [9-20] 16 (01/20 9604) BP: (112-146)/(64-88) 112/72 mmHg (01/20 0638) SpO2:  [92 %-99 %] 92 % (01/20 5409) Weight:  [220 lb 14.4 oz (100.2 kg)-225 lb 8.5 oz (102.3 kg)] 220 lb 14.4 oz (100.2 kg) (01/20 0337) Last BM Date: 12/11/15  Weight change: Filed Weights   12/11/15 2338 12/12/15 0337  Weight: 225 lb 8.5 oz (102.3 kg) 220 lb 14.4 oz (100.2 kg)    Intake/Output:   Intake/Output Summary (Last 24 hours) at 12/12/15 1016 Last data filed at 12/12/15 1012  Gross per 24 hour  Intake    360 ml  Output   1130 ml  Net   -770 ml     Physical Exam: General: Obese male; no resp difficutly HEENT: normal Neck: supple. Thick, JVP difficult to assess, but appears 6-7 cm. Carotids 2+ bilaterally; no bruits. No thyromegaly or nodule noted. Cor: PMI nonpalpable. RRR. No M/G/R  appreciated Lungs: Slightly diminished breath sounds, otherwise clear. Normal effort. Abdomen: obese. soft, NT, mild distention, no HSM. No bruits or masses. +BS  Extremities: no cyanosis, clubbing, rash. Trace- ankle edema. Skinny legs.  Neuro: alert & orientedx3, cranial nerves grossly intact. Affect pleasant  Telemetry: Reviewed personally, NSR 70s  Labs: Basic Metabolic Panel:  Recent Labs Lab 12/11/15 2345  NA 139  K 4.5  CL 98*  CO2 26  GLUCOSE 85  BUN 33*  CREATININE 2.44*  CALCIUM 10.2    Liver Function Tests: No results for input(s): AST, ALT, ALKPHOS, BILITOT, PROT, ALBUMIN in the last 168 hours. No results for input(s): LIPASE, AMYLASE in the last 168 hours. No results for input(s): AMMONIA in the last 168 hours.  CBC:  Recent Labs Lab 12/11/15 2345  WBC 6.1  HGB 11.8*  HCT 37.4*  MCV 88.8  PLT 271    Cardiac Enzymes: No results for input(s): CKTOTAL, CKMB, CKMBINDEX, TROPONINI in the last 168 hours.  BNP: BNP (last 3 results)  Recent Labs  09/15/15 1124 09/29/15 1415 12/11/15 2345  BNP 501.0* 550.7* 1024.0*    ProBNP (last 3 results) No results for input(s): PROBNP in the last 8760 hours.   CBG:  Recent Labs Lab 12/12/15 0350 12/12/15 1008  GLUCAP 78 139*    Coagulation Studies: No results for input(s): LABPROT, INR in the last 72 hours.  Other results: EKG: 12/11/15 NSR 70s, PVC  Imaging: Dg Chest 2 View  12/12/2015  CLINICAL DATA:  Acute onset of chest heaviness and shortness of breath. Initial encounter. EXAM: CHEST  2 VIEW COMPARISON:  Chest radiograph from 11/13/2015 FINDINGS: The lungs are well-aerated. Vascular congestion is noted, with mild bilateral atelectasis. There is no evidence of pleural effusion or pneumothorax. The heart is mildly enlarged. No acute osseous abnormalities are seen. IMPRESSION: Vascular congestion and mild cardiomegaly, with mild bilateral atelectasis. Electronically Signed   By: Roanna Raider M.D.    On: 12/12/2015 00:10      Medications:     Current Medications: . allopurinol  100 mg Oral Daily  . apixaban  5 mg Oral BID  . carvedilol  12.5 mg Oral BID WC  . docusate sodium  100 mg Oral BID  . furosemide  80 mg Intravenous Once  . hydrALAZINE  50 mg Oral TID  . insulin aspart  0-9 Units Subcutaneous 6 times per day  . isosorbide mononitrate  60 mg Oral Daily  . metolazone  2.5 mg Oral Once per day on Tue Fri  . omega-3 acid ethyl esters  1 g Oral Daily  . potassium chloride SA  40 mEq Oral BID  . pregabalin  75 mg Oral Daily  . sodium chloride  3 mL Intravenous Q12H  . tiotropium  18 mcg Inhalation Daily     Infusions:      Assessment   1. Acute on chronic systolic EF, Echo 08/2015 LVEF 25-30%, mild MR, RV mildly dilated.  2. Afib, paroxysmal 3. HTN 4. CKD IV 5. OSA 6. COPD, s/p recent exacerbation.   Plan   He still appears volume overloaded. Continue IV  diuresis for today. He's in for another 80 mg IV lasix this afternoon. Agree. Likely ok to transition back to his home diuretic regimen tomorrow. He has an appointment for next week. If stable tomorrow, should go home and follow up with HF clinic on 12/16/15.  Creatinine near baseline. Will need to follow closely with diuresis. Electrolytes stable.   Sleep study 11/09/15 consistent with complex obstructive sleep apnea. Recommended for urgent follow up and Bipap titration. Not completed yet. Recent COPD exacerbation may have contributed to volume overload. Strongly recommended he follow up for Bipap.   Length of Stay: 0  Graciella Freer PA-C 12/12/2015, 10:17 AM  Advanced Heart Failure Team Pager [phone] (M-F; 7a - 4p)  Please contact [facility] for night-coverage after hours (4p -7a ) and weekends on [email or web address]  Patient seen and examined with Otilio Saber, PA-C. We discussed all aspects of the encounter. I agree with the assessment and plan as stated above.   He was admitted with  decompensated HF in setting of dietary indiscretion. BNP double baseline. Creatinine stable. Much improved with IV lasix. Potassium stable. Will give one more dose then switch back to po. Likely can go home in am on previous regimen. Reinforced need for daily weights and reviewed use of sliding scale diuretics. We will arrange f/u in HF Clinic.   We will sign off. Please call with questions.   Bensimhon, Daniel,MD 11:20 AM

## 2015-12-12 NOTE — Progress Notes (Signed)
Patient seen and examined  65 year old male with past history significant for HTN, COPD, systolic CHF last EF 25-30% in 08/2015, CKD stage IV; who presents with the onset of shortness of breath. Patient states this morning and noted a 5 pound increase in his weight from the previous day. He initially tried to take metolazone, restricting his of fluid intake, and reports taking 2 breathing treatments without relief of symptoms. He follows with Dr. Gala Romney of cardiology. Denies any other changes recommend. Not on oxygen at baseline  Upon admission into the emergency department patient found to have a BNP of 1024, troponin i 0.06, an EKG was ordered. Patient received 60 mg of Lasix IV in ED. Urine output 7 50 mL, comfortable, 92% on room air, blood pressure 112/72   Assessment and plan Acute on chronic systolic CHF (congestive heart failure) [facility]): Patient of Bensimhon cardiology presents with shortness of breath and 5 pound weight gain. 08/2015 EF 25-30%. BnP elevated at 1024. Given 60 mg of Lasix in the ED. Consulted with pharmacy who notes home diuretic regimen is equivalent to 160 mg of Lasix by mouth. Continue telemetry, consult cardiology  continue diuresis per cards  - Strict ins and outs and daily weights - Continuous pulse oximetry and nasal cannula needed to keep O2 sats greater than 92% - Continue isosorbide mononitrate, Hydralazine, Coreg, and metolazone    PAF (paroxysmal atrial fibrillation) with Chronic anticoagulation - Continue Eliquis   Essential hypertension - cont. medicines as listed above   Type 2 diabetes mellitus with stage 4 chronic kidney disease, without long-term current use of insulin (HCC) - Discontinued glipizide while hospitalized to avoid hypoglycemia - CBGs every 4 hours overnight with sensitive sliding scale insulin  Hold oral hypoglycemic agents    CKD (chronic kidney disease), stage IV (HCC): Patient near his baseline creatinine 2.4-  3. Creatinine 2.44, continue to monitor the setting of diuresis, recheck tomorrow

## 2015-12-12 NOTE — Discharge Instructions (Signed)
Information on my medicine - ELIQUIS® (apixaban) ° °This medication education was reviewed with me or my healthcare representative as part of my discharge preparation.  The pharmacist that spoke with me during my hospital stay was:  Kieran Arreguin Rhea, RPH ° °Why was Eliquis® prescribed for you? °Eliquis® was prescribed for you to reduce the risk of a blood clot forming that can cause a stroke if you have a medical condition called atrial fibrillation (a type of irregular heartbeat). ° °What do You need to know about Eliquis® ? °Take your Eliquis® TWICE DAILY - one tablet in the morning and one tablet in the evening with or without food. If you have difficulty swallowing the tablet whole please discuss with your pharmacist how to take the medication safely. ° °Take Eliquis® exactly as prescribed by your doctor and DO NOT stop taking Eliquis® without talking to the doctor who prescribed the medication.  Stopping may increase your risk of developing a stroke.  Refill your prescription before you run out. ° °After discharge, you should have regular check-up appointments with your healthcare provider that is prescribing your Eliquis®.  In the future your dose may need to be changed if your kidney function or weight changes by a significant amount or as you get older. ° °What do you do if you miss a dose? °If you miss a dose, take it as soon as you remember on the same day and resume taking twice daily.  Do not take more than one dose of ELIQUIS at the same time to make up a missed dose. ° °Important Safety Information °A possible side effect of Eliquis® is bleeding. You should call your healthcare provider right away if you experience any of the following: °  Bleeding from an injury or your nose that does not stop. °  Unusual colored urine (red or dark brown) or unusual colored stools (red or black). °  Unusual bruising for unknown reasons. °  A serious fall or if you hit your head (even if there is no  bleeding). ° °Some medicines may interact with Eliquis® and might increase your risk of bleeding or clotting while on Eliquis®. To help avoid this, consult your healthcare provider or pharmacist prior to using any new prescription or non-prescription medications, including herbals, vitamins, non-steroidal anti-inflammatory drugs (NSAIDs) and supplements. ° °This website has more information on Eliquis® (apixaban): http://www.eliquis.com/eliquis/home ° °

## 2015-12-12 NOTE — Consult Note (Signed)
   [facility] Inpatient Consult   12/12/2015  HASSON GASPARD [birth year] [id number] Patient was assessed for [facility] [facility] for community services under his [facility] plan. Patient was previously active with [place] [facility] with [place], [place] RN and [place].  Patient admitted with HF exacerbation  Met with patient at bedside regarding being restarted with [facility] services. Consent form active. Patient will receive post hospital follow up calls and be evaluated for home visits.   Of note, [facility] Care Management services does not replace or interfere with any services that are arranged by inpatient case management or social work. For additional questions or referrals please contact: Natividad Brood, RN BSN [facility] Liaison  [phone] business mobile phone Toll free office [phone]

## 2015-12-12 NOTE — Care Management Note (Signed)
Case Management Note  Patient Details  Name: Frank Davidson MRN: [id number] Date of Birth: [birth year]  Subjective/Objective:  Pt lives with niece and sister, is followed in Heart Failure Clinic and is visited weekly by paramedic, Florentina Addison, who monitors med compliance, s/s, etc.  Has appointment with Clinic and feels he has good support, declines home health services.                      Expected Discharge Plan:  Home/Self Care  Discharge planning Services  CM Consult  Status of Service:  Completed, signed off  [place], [place] 12/12/2015, 2:58 PM

## 2015-12-12 NOTE — ED Notes (Signed)
Pt complaining of feeling SOB, SPo2 sitting at 100% on room air. O2 via nasal cannula given at 1 l/m for comfort

## 2015-12-12 NOTE — Progress Notes (Signed)
Utilization review completed. Teighlor Korson, RN, BSN. 

## 2015-12-12 NOTE — H&P (Signed)
[facility] History and Physical  DREGAN PALUBICKI JQZ:[id number] DOB: [birth year] DOA: 12/11/2015  Referring physician: ED PCP: Abbe Amsterdam, MD   Chief Complaint: SOB  HPI:  Mr. Langa is a 65 year old male with past history significant for HTN, COPD, systolic CHF last EF 25-30% in 08/2015, CKD stage IV; who presents with the onset of shortness of breath. Patient states this morning and noted a 5 pound increase in his weight from the previous day. He initially tried to take metolazone, restricting his of fluid intake, and reports taking 2 breathing treatments without relief of symptoms. He follows with Dr. Gala Romney of cardiology. Denies any other changes recommend. Not on oxygen at baseline. He reported associated symptoms of dyspnea on exertion, orthopnea, abdominal tightness,  and leg swelling. Denies having any chest pain, diaphoresis, nausea, vomiting, or palpitations. He does report having some illness about two weeks ago and finishing his last course of antibiotics this past week.  Upon admission into the emergency department patient found to have a BNP of 1024, troponin i 0.06, an EKG was ordered. Patient received 60 mg of Lasix IV in ED.    Review of Systems  Constitutional: Negative for weight loss and diaphoresis.       Positive for weight gain  HENT: Negative for ear pain and tinnitus.   Eyes: Negative for double vision and photophobia.  Respiratory: Positive for shortness of breath. Negative for wheezing.   Cardiovascular: Positive for orthopnea and leg swelling. Negative for chest pain.  Gastrointestinal: Negative for nausea, vomiting, diarrhea and constipation.       Positive abdominal tightness  Genitourinary: Negative for frequency and hematuria.  Musculoskeletal: Negative for falls.  Skin: Negative for itching and rash.  Neurological: Negative for focal weakness and seizures.  Psychiatric/Behavioral: Negative for suicidal ideas and memory loss.         Past Medical History  Diagnosis Date  . Atrial fibrillation (HCC)     PAF  . Hypertension   . COPD (chronic obstructive pulmonary disease) (HCC)   . Ocular herpes   . Gout   . CHF (congestive heart failure) (HCC)   . Chronic pain   . Hyperlipidemia   . Nonischemic cardiomyopathy (HCC) Aug 2015    EF 15%  . Ocular herpes   . Neuropathy (HCC)   . Left-sided weakness     "because of the arthritis and edema"  . PVD (peripheral vascular disease) [facility]) May 2015    abnormal ABIs  . Memory impairment     "short term"  . CKD (chronic kidney disease), stage III     Dr. Briant Cedar follows-holding   . Fibromyalgia     neuropathy- hands, more than feet.  . Sleep apnea with use of continuous positive airway pressure (CPAP)     does not use cpap  . GERD (gastroesophageal reflux disease)     tums as needed  . Asthma   . Type II diabetes mellitus (HCC)   . Stroke [facility]) X 5    last stroke aug 2014"; denies residual on 09/12/2014  . Osteoarthritis   . Arthritis     "knees, right elbow, shoulder" (09/12/2014)  . Shortness of breath dyspnea      Past Surgical History  Procedure Laterality Date  . 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 Tonsillectomy  age 64  . Esophagogastroduodenoscopy N/A 12/24/2013    Procedure: ESOPHAGOGASTRODUODENOSCOPY (EGD);  Surgeon: Louis Meckel, MD;  Location: [facility];  Service: Endoscopy;  Laterality:  N/A;  . Colonoscopy N/A 12/24/2013    Procedure: COLONOSCOPY;  Surgeon: Louis Meckel, MD;  Location: [facility] ENDOSCOPY;  Service: Endoscopy;  Laterality: N/A;  . Joint replacement    . Cardiac catheterization  10/2006    normal coronary arteries;   Marland Kitchen Cataract extraction w/ intraocular lens  implant, bilateral Bilateral ~ 2008  . Total hip arthroplasty Left 09/12/2014    Procedure: LEFT TOTAL HIP ARTHROPLASTY ANTERIOR APPROACH;  Surgeon: [name], MD;  Location: [facility] OR;  Service: Orthopedics;  Laterality: Left;  . Right heart catheterization  N/A 08/13/2014    Rt ht cath prior to hip surgery      Social History:  reports that he quit smoking about 8 years ago. His smoking use included Cigarettes. He has a 6.5 pack-year smoking history. He has never used smokeless tobacco. He reports that he uses illicit drugs (Cocaine and Marijuana). He reports that he does not drink alcohol.   Allergies  Allergen Reactions  . Corlanor [Ivabradine] Palpitations and Other (See Comments)    Kidney and heart problems, syncope  . Entresto [Sacubitril-Valsartan]     Syncope  . Januvia [Sitagliptin] Other (See Comments)    "Almost killed me"   . Lunesta [Eszopiclone] Palpitations and Other (See Comments)    Kidney and heart problems, syncope  . Actos [Pioglitazone] Other (See Comments)    Caused blood in urine and fluid retention     Family History  Problem Relation Age of Onset  . Cancer Mother   . Hypertension Mother   . Lung disease Mother   . Diabetes Father   . Diabetes Sister   . Hypertension Sister   . Hypertension Brother   . Hypertension Sister   . Colon cancer Neg Hx        Prior to Admission medications   Medication Sig Start Date End Date Taking? Authorizing Provider  albuterol (PROVENTIL HFA;VENTOLIN HFA) 108 (90 BASE) MCG/ACT inhaler Inhale 2 puffs into the lungs every 6 (six) hours as needed for wheezing or shortness of breath. 06/30/15  Yes Gwenlyn Found Copland, MD  albuterol (PROVENTIL) (2.5 MG/3ML) 0.083% nebulizer solution Take 3 mLs (2.5 mg total) by nebulization every 4 (four) hours as needed for wheezing or shortness of breath. 11/26/15  Yes Carmelina Dane, MD  allopurinol (ZYLOPRIM) 100 MG tablet TAKE 1 TABLET (100 MG TOTAL) BY MOUTH DAILY. 10/30/15  Yes Gwenlyn Found Copland, MD  apixaban (ELIQUIS) 5 MG TABS tablet Take 1 tablet (5 mg total) by mouth 2 (two) times daily. 02/04/15  Yes Dolores Patty, MD  baclofen (LIORESAL) 10 MG tablet TAKE 1 TABLET EVERY 8 HOURS AS NEEDED FOR MUSCLE SPASM(S) 09/16/15  Yes Gwenlyn Found Copland, MD  carvedilol (COREG) 25 MG tablet Take 12.5 mg by mouth 2 (two) times daily with a meal.   Yes Historical Provider, MD  diclofenac (FLECTOR) 1.3 % PTCH Place 1 patch onto the skin 2 (two) times daily as needed (pain). 11/11/15  Yes Gwenlyn Found Copland, MD  docusate sodium 100 MG CAPS Take 100 mg by mouth 2 (two) times daily. 09/16/14  Yes Matthew Babish, PA-C  glipiZIDE (GLUCOTROL) 5 MG tablet Take 5 mg by mouth 2 (two) times daily before a meal.   Yes Historical Provider, MD  hydrALAZINE (APRESOLINE) 50 MG tablet Take 1 tablet (50 mg total) by mouth 3 (three) times daily. 09/15/15  Yes Laurey Morale, MD  isosorbide mononitrate (IMDUR) 60 MG 24 hr tablet Take 60 mg by  mouth daily.   Yes Historical Provider, MD  metolazone (ZAROXOLYN) 2.5 MG tablet Take 1 tablet (2.5 mg total) by mouth 2 (two) times a week. Tuesday and Friday 09/29/15  Yes Dolores Patty, MD  omega-3 acid ethyl esters (LOVAZA) 1 G capsule Take 1 g by mouth daily.   Yes Historical Provider, MD  oxyCODONE-acetaminophen (PERCOCET) 10-325 MG tablet Take 1 tablet by mouth every 8 (eight) hours as needed for pain. Use with stool softerner. 12/03/15  Yes Jessica C Copland, MD  polyethylene glycol powder (GLYCOLAX/MIRALAX) powder MIX 1 CAPFUL (17GM)  IN  LIQUID  AND  DRINK EVERY DAY AS DIRECTED 07/31/15  Yes Gwenlyn Found Copland, MD  potassium chloride SA (K-DUR,KLOR-CON) 20 MEQ tablet Take 2 tabs in AM and 1 tab in PM 12/02/15  Yes Jessica C Copland, MD  pregabalin (LYRICA) 75 MG capsule Take 1 capsule (75 mg total) by mouth daily. 11/11/15  Yes Gwenlyn Found Copland, MD  tiotropium (SPIRIVA HANDIHALER) 18 MCG inhalation capsule Place 1 capsule (18 mcg total) into inhaler and inhale daily. 09/29/15  Yes Michele Mcalpine, MD  torsemide (DEMADEX) 20 MG tablet TAKE 4 TABLETS (80MG )  TWICE DAILY (CHANGE IN DOSE) 11/26/15  Yes Dolores Patty, MD     Physical Exam: Filed Vitals:   12/11/15 2338 12/11/15 2348 12/12/15 0200  BP:  112/84  146/88  Pulse:  68 86  Temp:  97.6 F (36.4 C)   TempSrc:  Oral   Resp:  9 14  Weight: 102.3 kg (225 lb 8.5 oz)    SpO2:  96% 97%     Constitutional: Vital signs reviewed. Patient is a well-developed and well-nourished in no acute distress and cooperative with exam. Alert and oriented x3.  Head: Normocephalic and atraumatic  Ear: TM normal bilaterally  Mouth: no erythema or exudates, MMM  Eyes: PERRL, EOMI, conjunctivae normal, No scleral icterus.  Neck: Supple, Trachea midline normal ROM, No JVD, mass, thyromegaly, or carotid bruit present.  Cardiovascular: RRR, S1 normal, S2 normal, no MRG, pulses symmetric and intact bilaterally  Pulmonary/Chest: CTAB, no wheezes, rales, or rhonchi  Abdominal: Soft. Mild tenderness to palpation, mild distention, bowel sounds are normal, no masses, organomegaly, or guarding present.  GU: no CVA tenderness Musculoskeletal: No joint deformities, erythema, or stiffness, ROM full and no nontender Ext: +1 pitting edema of the bilateral lower extremity and no cyanosis, pulses palpable bilaterally (DP and PT)  Hematology: no cervical, inginal, or axillary adenopathy.  Neurological: A&O x3, Strenght is normal and symmetric bilaterally, cranial nerve II-XII are grossly intact, no focal motor deficit, sensory intact to light touch bilaterally.  Skin: Warm, dry and intact. No rash, cyanosis, or clubbing.  Psychiatric: Normal mood and affect. speech and behavior is normal. Judgment and thought content normal. Cognition and memory are normal.      Data Review   Micro Results No results found for this or any previous visit (from the past 240 hour(s)).  Radiology Reports Dg Chest 2 View  12/12/2015  CLINICAL DATA:  Acute onset of chest heaviness and shortness of breath. Initial encounter. EXAM: CHEST  2 VIEW COMPARISON:  Chest radiograph from 11/13/2015 FINDINGS: The lungs are well-aerated. Vascular congestion is noted, with mild bilateral atelectasis. There  is no evidence of pleural effusion or pneumothorax. The heart is mildly enlarged. No acute osseous abnormalities are seen. IMPRESSION: Vascular congestion and mild cardiomegaly, with mild bilateral atelectasis. Electronically Signed   By: Roanna Raider M.D.   On: 12/12/2015 00:10  Dg Chest 2 View  11/13/2015  CLINICAL DATA:  Sore throat and cough. EXAM: CHEST  2 VIEW COMPARISON:  08/27/2015 FINDINGS: Cardiomediastinal silhouette is enlarged. Mediastinal contours appear intact. There is no evidence of pleural effusion or pneumothorax. There are linear opacities in bilateral mid lungs, which may represent atelectasis or developing airspace consolidation. Osseous structures are without acute abnormality. Soft tissues are grossly normal. IMPRESSION: Linear opacities in bilateral mid lungs which may represent atelectasis versus developing airspace consolidation. Electronically Signed   By: Ted Mcalpine M.D.   On: 11/13/2015 21:28     CBC  Recent Labs Lab 12/11/15 2345  WBC 6.1  HGB 11.8*  HCT 37.4*  PLT 271  MCV 88.8  MCH 28.0  MCHC 31.6  RDW 17.9*    Chemistries   Recent Labs Lab 12/11/15 2345  NA 139  K 4.5  CL 98*  CO2 26  GLUCOSE 85  BUN 33*  CREATININE 2.44*  CALCIUM 10.2   ------------------------------------------------------------------------------------------------------------------ estimated creatinine clearance is 36.2 mL/min (by C-G formula based on Cr of 2.44). ------------------------------------------------------------------------------------------------------------------ No results for input(s): HGBA1C in the last 72 hours. ------------------------------------------------------------------------------------------------------------------ No results for input(s): CHOL, HDL, LDLCALC, TRIG, CHOLHDL, LDLDIRECT in the last 72 hours. ------------------------------------------------------------------------------------------------------------------ No results for  input(s): TSH, T4TOTAL, T3FREE, THYROIDAB in the last 72 hours.  Invalid input(s): FREET3 ------------------------------------------------------------------------------------------------------------------ No results for input(s): VITAMINB12, FOLATE, FERRITIN, TIBC, IRON, RETICCTPCT in the last 72 hours.  Coagulation profile No results for input(s): INR, PROTIME in the last 168 hours.  No results for input(s): DDIMER in the last 72 hours.  Cardiac Enzymes No results for input(s): CKMB, TROPONINI, MYOGLOBIN in the last 168 hours.  Invalid input(s): CK ------------------------------------------------------------------------------------------------------------------ Invalid input(s): POCBNP   CBG: No results for input(s): GLUCAP in the last 168 hours.     EKG: Independently reviewed.   Assessment/Plan Principal Problem:  Acute on chronic systolic CHF (congestive heart failure) [facility]): Patient of [facility] cardiology presents with shortness of breath and 5 pound weight gain.  08/2015 EF 25-30%. BnP elevated at 1024. Given 60 mg of Lasix in the ED. Consulted with pharmacy who notes home diuretic regimen is equivalent to 160 mg of Lasix by mouth. - Admit to telemetry bed - Continue 80 mg of Lasix IV 1 dose now,  will need to reassess and continue diuresis - Strict ins and outs and daily weights - Consult cardiology in a.m. - Continuous pulse oximetry and nasal cannula needed to keep O2 sats greater than 92% - Continue isosorbide mononitrate,  Hydralazine, Coreg, and metolazone   PAF (paroxysmal atrial fibrillation) with Chronic anticoagulation - Continue Eliquis    Essential hypertension - cont. medicines as listed above    Type 2 diabetes mellitus with stage 4 chronic kidney disease, without long-term current use of insulin (HCC) - Discontinued glipizide while hospitalized to avoid hypoglycemia - CBGs every 4 hours overnight with sensitive sliding scale insulin if needed -  Hypoglycemic  CKD (chronic kidney disease), stage IV (HCC): Patient near his baseline creatinine 2.4- 3. - Continue to monitor    Code Status:   full Family Communication: bedside Disposition Plan: admit   Total time spent 55 minutes.Greater than 50% of this time was spent in counseling, explanation of diagnosis, planning of further management, and coordination of care  Clydie Braun [facility] Pager [phone]  If 7PM-7AM, please contact night-coverage [email or web address] Password [email or web address] 12/12/2015, 2:56 AM

## 2015-12-13 LAB — COMPREHENSIVE METABOLIC PANEL
ALT: 19 U/L (ref 17–63)
AST: 29 U/L (ref 15–41)
Albumin: 3.7 g/dL (ref 3.5–5.0)
Alkaline Phosphatase: 73 U/L (ref 38–126)
Anion gap: 13 (ref 5–15)
BUN: 39 mg/dL — ABNORMAL HIGH (ref 6–20)
CHLORIDE: 96 mmol/L — AB (ref 101–111)
CO2: 30 mmol/L (ref 22–32)
CREATININE: 2.56 mg/dL — AB (ref 0.61–1.24)
Calcium: 9.8 mg/dL (ref 8.9–10.3)
GFR calc non Af Amer: 25 mL/min — ABNORMAL LOW (ref >60)
GFR, EST AFRICAN AMERICAN: 29 mL/min — AB (ref >60)
Glucose, Bld: 85 mg/dL (ref 65–99)
Potassium: 3.5 mmol/L (ref 3.5–5.1)
SODIUM: 139 mmol/L (ref 135–145)
Total Bilirubin: 0.5 mg/dL (ref 0.3–1.2)
Total Protein: 7.9 g/dL (ref 6.5–8.1)

## 2015-12-13 LAB — CBC
HCT: 39.5 % (ref 39.0–52.0)
Hemoglobin: 12.6 g/dL — ABNORMAL LOW (ref 13.0–17.0)
MCH: 28.5 pg (ref 26.0–34.0)
MCHC: 31.9 g/dL (ref 30.0–36.0)
MCV: 89.4 fL (ref 78.0–100.0)
PLATELETS: 296 10*3/uL (ref 150–400)
RBC: 4.42 MIL/uL (ref 4.22–5.81)
RDW: 17.7 % — AB (ref 11.5–15.5)
WBC: 6.9 10*3/uL (ref 4.0–10.5)

## 2015-12-13 LAB — GLUCOSE, CAPILLARY
GLUCOSE-CAPILLARY: 93 mg/dL (ref 65–99)
GLUCOSE-CAPILLARY: 105 mg/dL — AB (ref 65–99)

## 2015-12-13 LAB — HEMOGLOBIN A1C
HEMOGLOBIN A1C: 5.7 % — AB (ref 4.8–5.6)
MEAN PLASMA GLUCOSE: 117 mg/dL

## 2015-12-13 MED ORDER — FUROSEMIDE 10 MG/ML IJ SOLN
80.0000 mg | Freq: Once | INTRAMUSCULAR | Status: AC
  Administered 2015-12-13: 80 mg via INTRAVENOUS
  Filled 2015-12-13: qty 8

## 2015-12-13 MED ORDER — TORSEMIDE 20 MG PO TABS: ORAL_TABLET | ORAL | Status: DC

## 2015-12-13 NOTE — Progress Notes (Signed)
Patient is discharge to home accompanied by NT and family members via wheelchair/private car. Discharge instructions given. Patient verbalizes understanding . All personal belongings given. Telemetry box and IV removed and site in good condiition prior to discharge.

## 2015-12-13 NOTE — Evaluation (Signed)
Physical Therapy Evaluation Patient Details Name: Frank Davidson MRN: [id number] DOB: 1951/10/23 Today's Date: 12/13/2015   History of Present Illness  Patient has a hx of HTN, COPD, CHF, cardio myopathy, gout, PVD, fibromyalgia, and CKD stage III was brought in by ambulance, who presented to the Emergency Department complaining of sudden onset, gradual worsening shortness of breath with associated abdomen distension and mild nausea onset.  Clinical Impression  Patient did well with mobility, only requiring supervision.  Patient felt unsteady during gait and thus recommend cane at discharge (which he owns).  I did not see any balance deficits and feel patient safe for discharge from PT standpoint.  Will sign off.    Follow Up Recommendations No PT follow up    Equipment Recommendations  None recommended by PT    Recommendations for Other Services       Precautions / Restrictions Precautions Precautions: Fall      Mobility  Bed Mobility Overal bed mobility: Independent                Transfers Overall transfer level: Independent                  Ambulation/Gait Ambulation/Gait assistance: Supervision Ambulation Distance (Feet): 150 Feet Assistive device: None Gait Pattern/deviations: Step-through pattern     General Gait Details: ambulated with "stiff legs" and required cueing to relax knees during gait.  No apparent balance deficits, able to tolerate mild perturbations.  Patient did "feel" slightly off balance and thus recommend cane at discharge.  Stairs            Wheelchair Mobility    Modified Rankin (Stroke Patients Only)       Balance Overall balance assessment: No apparent balance deficits (not formally assessed)                                           Pertinent Vitals/Pain Pain Assessment: No/denies pain    Home Living Family/patient expects to be discharged to:: Private residence   Available Help at  Discharge: Family;Available PRN/intermittently Type of Home: House Home Access: Stairs to enter Entrance Stairs-Rails: Right Entrance Stairs-Number of Steps: 3 Home Layout: One level Home Equipment: Walker - 2 wheels;Cane - single point      Prior Function Level of Independence: Independent         Comments: had DME after prior ortho surgery; has not been using recently     Hand Dominance   Dominant Hand: Left    Extremity/Trunk Assessment   Upper Extremity Assessment: Overall WFL for tasks assessed           Lower Extremity Assessment: Overall WFL for tasks assessed      Cervical / Trunk Assessment: Normal  Communication   Communication: No difficulties  Cognition Arousal/Alertness: Awake/alert Behavior During Therapy: WFL for tasks assessed/performed Overall Cognitive Status: Within Functional Limits for tasks assessed                      General Comments      Exercises        Assessment/Plan    PT Assessment Patent does not need any further PT services  PT Diagnosis Generalized weakness   PT Problem List    PT Treatment Interventions     PT Goals (Current goals can be found in the Care Plan section) Acute Rehab PT  Goals PT Goal Formulation: All assessment and education complete, DC therapy    Frequency     Barriers to discharge        Co-evaluation               End of Session   Activity Tolerance: Patient tolerated treatment well Patient left: in bed;with call bell/phone within reach (sitting EOB, MD in room)           Time: 8264-1583 PT Time Calculation (min) (ACUTE ONLY): 15 min   Charges:   PT Evaluation $PT Eval Low Complexity: 1 Procedure     PT G CodesOlivia Canter 12/13/2015, 12:44 PM 12/13/2015 Corlis Hove, PT [phone]

## 2015-12-13 NOTE — Progress Notes (Signed)
SUBJECTIVE: The patient is doing well today.  At this time, he denies chest pain, shortness of breath, or any new concerns.  Feeling much improved since admission. Out 1.5L.  Feels that increased shortness of breath was due to dietary indiscretion.    Marland Kitchen allopurinol  100 mg Oral Daily  . apixaban  5 mg Oral BID  . carvedilol  12.5 mg Oral BID WC  . docusate sodium  100 mg Oral BID  . hydrALAZINE  50 mg Oral TID  . insulin aspart  0-9 Units Subcutaneous TID WC  . isosorbide mononitrate  60 mg Oral Daily  . metolazone  2.5 mg Oral Once per day on Tue Fri  . omega-3 acid ethyl esters  1 g Oral Daily  . pregabalin  75 mg Oral Daily  . sodium chloride  3 mL Intravenous Q12H  . tiotropium  18 mcg Inhalation Daily      OBJECTIVE: Physical Exam: Filed Vitals:   12/12/15 1928 12/13/15 0403 12/13/15 1056 12/13/15 1124  BP: 104/69 115/72 103/75   Pulse: 74 79    Temp: 98 F (36.7 C) 97.9 F (36.6 C)    TempSrc: Oral Oral    Resp: 18 18    Height:      Weight:  219 lb 3.2 oz (99.428 kg)    SpO2: 98% 98%  92%    Intake/Output Summary (Last 24 hours) at 12/13/15 1135 Last data filed at 12/13/15 1100  Gross per 24 hour  Intake    963 ml  Output   1750 ml  Net   -787 ml    Telemetry reveals sinus rhythm  GEN- The patient is well appearing, alert and oriented x 3 today.   Head- normocephalic, atraumatic Eyes-  Sclera clear, conjunctiva pink Ears- hearing intact Oropharynx- clear Neck- supple, no JVP Lymph- no cervical lymphadenopathy Lungs- Clear to ausculation bilaterally, normal work of breathing Heart- Regular rate and rhythm, no murmurs, rubs or gallops, PMI not laterally displaced GI- soft, NT, ND, + BS Extremities- no clubbing, cyanosis, or edema Skin- no rash or lesion Psych- euthymic mood, full affect Neuro- strength and sensation are intact  LABS: Basic Metabolic Panel:  Recent Labs  72/53/66 1210 12/13/15 0305  NA 138 139  K 3.4* 3.5  CL 97* 96*  CO2  30 30  GLUCOSE 83 85  BUN 32* 39*  CREATININE 2.37* 2.56*  CALCIUM 10.1 9.8   Liver Function Tests:  Recent Labs  12/13/15 0305  AST 29  ALT 19  ALKPHOS 73  BILITOT 0.5  PROT 7.9  ALBUMIN 3.7   No results for input(s): LIPASE, AMYLASE in the last 72 hours. CBC:  Recent Labs  12/11/15 2345 12/13/15 0305  WBC 6.1 6.9  HGB 11.8* 12.6*  HCT 37.4* 39.5  MCV 88.8 89.4  PLT 271 296   Cardiac Enzymes: No results for input(s): CKTOTAL, CKMB, CKMBINDEX, TROPONINI in the last 72 hours. BNP: Invalid input(s): POCBNP D-Dimer: No results for input(s): DDIMER in the last 72 hours. Hemoglobin A1C:  Recent Labs  12/12/15 1210  HGBA1C 5.7*   Fasting Lipid Panel:  Recent Labs  12/12/15 1210  CHOL 157  HDL 46  LDLCALC 81  TRIG 149  CHOLHDL 3.4   Thyroid Function Tests:  Recent Labs  12/12/15 1210  TSH 2.482   Anemia Panel: No results for input(s): VITAMINB12, FOLATE, FERRITIN, TIBC, IRON, RETICCTPCT in the last 72 hours.  RADIOLOGY: Dg Chest 2 View  12/12/2015  CLINICAL DATA:  Acute onset of chest heaviness and shortness of breath. Initial encounter. EXAM: CHEST  2 VIEW COMPARISON:  Chest radiograph from 11/13/2015 FINDINGS: The lungs are well-aerated. Vascular congestion is noted, with mild bilateral atelectasis. There is no evidence of pleural effusion or pneumothorax. The heart is mildly enlarged. No acute osseous abnormalities are seen. IMPRESSION: Vascular congestion and mild cardiomegaly, with mild bilateral atelectasis. Electronically Signed   By: Roanna Raider M.D.   On: 12/12/2015 00:10   Dg Chest 2 View  11/13/2015  CLINICAL DATA:  Sore throat and cough. EXAM: CHEST  2 VIEW COMPARISON:  08/27/2015 FINDINGS: Cardiomediastinal silhouette is enlarged. Mediastinal contours appear intact. There is no evidence of pleural effusion or pneumothorax. There are linear opacities in bilateral mid lungs, which may represent atelectasis or developing airspace  consolidation. Osseous structures are without acute abnormality. Soft tissues are grossly normal. IMPRESSION: Linear opacities in bilateral mid lungs which may represent atelectasis versus developing airspace consolidation. Electronically Signed   By: Ted Mcalpine M.D.   On: 11/13/2015 21:28    ASSESSMENT AND PLAN:  Principal Problem:   Acute on chronic systolic CHF (congestive heart failure) (HCC) Active Problems:   PAF (paroxysmal atrial fibrillation) (HCC)   Chronic anticoagulation   Essential hypertension   Type 2 diabetes mellitus with stage 4 chronic kidney disease, without long-term current use of insulin (HCC)   CKD (chronic kidney disease), stage IV (HCC) Has done well with diuresis since admission and feels well.  Ready to go home.  Creatinine near baseline.  Has follow up already in HF clinic.  Will plan to discharge today on home HF medications and follow up with HF on 1/24.  Will Jorja Loa, MD 12/13/2015 11:35 AM

## 2015-12-13 NOTE — Discharge Summary (Addendum)
Physician Discharge Summary  Frank Davidson MRN: [id number] DOB/AGE: 11-28-1950 65 y.o.  PCP: [name], MD   Admit date: 12/11/2015 Discharge date: 12/13/2015  Discharge Diagnoses:     Principal Problem:   Acute on chronic systolic CHF (congestive heart failure) (HCC) Active Problems:   PAF (paroxysmal atrial fibrillation) (HCC)   Chronic anticoagulation   Essential hypertension   Type 2 diabetes mellitus with stage 4 chronic kidney disease, without long-term current use of insulin (HCC)   CKD (chronic kidney disease), stage IV (HCC)    Follow-up recommendations Follow-up with PCP in 3-5 days , including all  additional recommended appointments as below Follow-up CBC, CMP in 3-5 days f/u in HF Clinic -  Jolaine Artist, MD     Medication List    TAKE these medications        albuterol 108 (90 Base) MCG/ACT inhaler  Commonly known as:  PROVENTIL HFA;VENTOLIN HFA  Inhale 2 puffs into the lungs every 6 (six) hours as needed for wheezing or shortness of breath.     albuterol (2.5 MG/3ML) 0.083% nebulizer solution  Commonly known as:  PROVENTIL  Take 3 mLs (2.5 mg total) by nebulization every 4 (four) hours as needed for wheezing or shortness of breath.     allopurinol 100 MG tablet  Commonly known as:  ZYLOPRIM  TAKE 1 TABLET (100 MG TOTAL) BY MOUTH DAILY.     apixaban 5 MG Tabs tablet  Commonly known as:  ELIQUIS  Take 1 tablet (5 mg total) by mouth 2 (two) times daily.     baclofen 10 MG tablet  Commonly known as:  LIORESAL  TAKE 1 TABLET EVERY 8 HOURS AS NEEDED FOR MUSCLE SPASM(S)     carvedilol 25 MG tablet  Commonly known as:  COREG  Take 12.5 mg by mouth 2 (two) times daily with a meal.     diclofenac 1.3 % Ptch  Commonly known as:  FLECTOR  Place 1 patch onto the skin 2 (two) times daily as needed (pain).     DSS 100 MG Caps  Take 100 mg by mouth 2 (two) times daily.     glipiZIDE 5 MG tablet  Commonly known as:  GLUCOTROL  Take 5 mg by  mouth 2 (two) times daily before a meal.     hydrALAZINE 50 MG tablet  Commonly known as:  APRESOLINE  Take 1 tablet (50 mg total) by mouth 3 (three) times daily.     isosorbide mononitrate 60 MG 24 hr tablet  Commonly known as:  IMDUR  Take 60 mg by mouth daily.     metolazone 2.5 MG tablet  Commonly known as:  ZAROXOLYN  Take 1 tablet (2.5 mg total) by mouth 2 (two) times a week. Tuesday and Friday     omega-3 acid ethyl esters 1 g capsule  Commonly known as:  LOVAZA  Take 1 g by mouth daily.     oxyCODONE-acetaminophen 10-325 MG tablet  Commonly known as:  PERCOCET  Take 1 tablet by mouth every 8 (eight) hours as needed for pain. Use with stool softerner.     polyethylene glycol powder powder  Commonly known as:  GLYCOLAX/MIRALAX  MIX 1 CAPFUL (17GM)  IN  LIQUID  AND  DRINK EVERY DAY AS DIRECTED     potassium chloride SA 20 MEQ tablet  Commonly known as:  K-DUR,KLOR-CON  Take 2 tabs in AM and 1 tab in PM     pregabalin 75 MG capsule  Commonly  known as:  LYRICA  Take 1 capsule (75 mg total) by mouth daily.     tiotropium 18 MCG inhalation capsule  Commonly known as:  SPIRIVA HANDIHALER  Place 1 capsule (18 mcg total) into inhaler and inhale daily.     torsemide 20 MG tablet  Commonly known as:  DEMADEX  TAKE 4 TABLETS (80MG)  TWICE DAILY (CHANGE IN DOSE)         Discharge Condition: Stable Discharge Instructions       Discharge Instructions    AMB Referral to [facility] Management    Complete by:  As directed   Reason for consult:  Post hospital follow up for HF exacerbation and care management needs.  Diagnoses of:   Heart Failure Kidney Failure    Expected date of contact:  1-3 days (reserved for hospital discharges)  Please assign to community nurse for transition of care calls and assess for home visits. Questions please call:  Natividad Brood, RN BSN [facility] Liaison  [phone] business mobile phone Toll free office  [phone]           Allergies  Allergen Reactions  . Corlanor [Ivabradine] Palpitations and Other (See Comments)    Kidney and heart problems, syncope  . Entresto [Sacubitril-Valsartan]     Syncope  . Januvia [Sitagliptin] Other (See Comments)    "Almost killed me"   . Lunesta [Eszopiclone] Palpitations and Other (See Comments)    Kidney and heart problems, syncope  . Actos [Pioglitazone] Other (See Comments)    Caused blood in urine and fluid retention       Disposition: 06-Home-Health Care Svc   Consults:  Heart failure team    Significant Diagnostic Studies:  Dg Chest 2 View  12/12/2015  CLINICAL DATA:  Acute onset of chest heaviness and shortness of breath. Initial encounter. EXAM: CHEST  2 VIEW COMPARISON:  Chest radiograph from 11/13/2015 FINDINGS: The lungs are well-aerated. Vascular congestion is noted, with mild bilateral atelectasis. There is no evidence of pleural effusion or pneumothorax. The heart is mildly enlarged. No acute osseous abnormalities are seen. IMPRESSION: Vascular congestion and mild cardiomegaly, with mild bilateral atelectasis. Electronically Signed   By: Garald Balding M.D.   On: 12/12/2015 00:10   Dg Chest 2 View  11/13/2015  CLINICAL DATA:  Sore throat and cough. EXAM: CHEST  2 VIEW COMPARISON:  08/27/2015 FINDINGS: Cardiomediastinal silhouette is enlarged. Mediastinal contours appear intact. There is no evidence of pleural effusion or pneumothorax. There are linear opacities in bilateral mid lungs, which may represent atelectasis or developing airspace consolidation. Osseous structures are without acute abnormality. Soft tissues are grossly normal. IMPRESSION: Linear opacities in bilateral mid lungs which may represent atelectasis versus developing airspace consolidation. Electronically Signed   By: Fidela Salisbury M.D.   On: 11/13/2015 21:28       Filed Weights   12/11/15 2338 12/12/15 0337 12/13/15 0403  Weight: 102.3 kg (225 lb  8.5 oz) 100.2 kg (220 lb 14.4 oz) 99.428 kg (219 lb 3.2 oz)     Microbiology: Recent Results (from the past 240 hour(s))  MRSA PCR Screening     Status: None   Collection Time: 12/12/15  4:38 AM  Result Value Ref Range Status   MRSA by PCR NEGATIVE NEGATIVE Final    Comment:        The GeneXpert MRSA Assay (FDA approved for NASAL specimens only), is one component of a comprehensive MRSA colonization surveillance program. It is not intended to  diagnose MRSA infection nor to guide or monitor treatment for MRSA infections.        Blood Culture    Component Value Date/Time   SDES BLOOD RIGHT HAND 08/27/2015 1959   [place] 3.5CC 08/27/2015 1959   CULT NO GROWTH 5 DAYS 08/27/2015 1959   REPTSTATUS 09/01/2015 FINAL 08/27/2015 1959      Labs: Results for orders placed or performed during the hospital encounter of 12/11/15 (from the past 48 hour(s))  Basic metabolic panel     Status: Abnormal   Collection Time: 12/11/15 11:45 PM  Result Value Ref Range   Sodium 139 135 - 145 mmol/L   Potassium 4.5 3.5 - 5.1 mmol/L   Chloride 98 (L) 101 - 111 mmol/L   CO2 26 22 - 32 mmol/L   Glucose, Bld 85 65 - 99 mg/dL   BUN 33 (H) 6 - 20 mg/dL   Creatinine, Ser 2.44 (H) 0.61 - 1.24 mg/dL   Calcium 10.2 8.9 - 10.3 mg/dL   GFR calc non Af Amer 26 (L) >60 mL/min   GFR calc Af Amer 30 (L) >60 mL/min    Comment: (NOTE) The eGFR has been calculated using the CKD EPI equation. This calculation has not been validated in all clinical situations. eGFR's persistently <60 mL/min signify possible Chronic Kidney Disease.    Anion gap 15 5 - 15  CBC     Status: Abnormal   Collection Time: 12/11/15 11:45 PM  Result Value Ref Range   WBC 6.1 4.0 - 10.5 K/uL   RBC 4.21 (L) 4.22 - 5.81 MIL/uL   Hemoglobin 11.8 (L) 13.0 - 17.0 g/dL   HCT 37.4 (L) 39.0 - 52.0 %   MCV 88.8 78.0 - 100.0 fL   MCH 28.0 26.0 - 34.0 pg   MCHC 31.6 30.0 - 36.0 g/dL   RDW 17.9 (H) 11.5 - 15.5 %    Platelets 271 150 - 400 K/uL  Brain natriuretic peptide     Status: Abnormal   Collection Time: 12/11/15 11:45 PM  Result Value Ref Range   B Natriuretic Peptide 1024.0 (H) 0.0 - 100.0 pg/mL  I-stat troponin, ED     Status: None   Collection Time: 12/12/15 12:31 AM  Result Value Ref Range   Troponin i, poc 0.06 0.00 - 0.08 ng/mL   Comment 3            Comment: Due to the release kinetics of cTnI, a negative result within the first hours of the onset of symptoms does not rule out myocardial infarction with certainty. If myocardial infarction is still suspected, repeat the test at appropriate intervals.   Urinalysis, Routine w reflex microscopic     Status: None   Collection Time: 12/12/15 12:42 AM  Result Value Ref Range   Color, Urine YELLOW YELLOW   APPearance CLEAR CLEAR   Specific Gravity, Urine 1.010 1.005 - 1.030   pH 7.5 5.0 - 8.0   Glucose, UA NEGATIVE NEGATIVE mg/dL   Hgb urine dipstick NEGATIVE NEGATIVE   Bilirubin Urine NEGATIVE NEGATIVE   Ketones, ur NEGATIVE NEGATIVE mg/dL   Protein, ur NEGATIVE NEGATIVE mg/dL   Nitrite NEGATIVE NEGATIVE   Leukocytes, UA NEGATIVE NEGATIVE    Comment: MICROSCOPIC NOT DONE ON URINES WITH NEGATIVE PROTEIN, BLOOD, LEUKOCYTES, NITRITE, OR GLUCOSE <1000 mg/dL.  Glucose, capillary     Status: None   Collection Time: 12/12/15  3:50 AM  Result Value Ref Range   Glucose-Capillary 78 65 - 99  mg/dL  MRSA PCR Screening     Status: None   Collection Time: 12/12/15  4:38 AM  Result Value Ref Range   MRSA by PCR NEGATIVE NEGATIVE    Comment:        The GeneXpert MRSA Assay (FDA approved for NASAL specimens only), is one component of a comprehensive MRSA colonization surveillance program. It is not intended to diagnose MRSA infection nor to guide or monitor treatment for MRSA infections.   Glucose, capillary     Status: Abnormal   Collection Time: 12/12/15 10:08 AM  Result Value Ref Range   Glucose-Capillary 139 (H) 65 - 99 mg/dL   TSH     Status: None   Collection Time: 12/12/15 12:10 PM  Result Value Ref Range   TSH 2.482 0.350 - 4.500 uIU/mL  Hemoglobin A1c     Status: Abnormal   Collection Time: 12/12/15 12:10 PM  Result Value Ref Range   Hgb A1c MFr Bld 5.7 (H) 4.8 - 5.6 %    Comment: (NOTE)         Pre-diabetes: 5.7 - 6.4         Diabetes: >6.4         Glycemic control for adults with diabetes: <7.0    Mean Plasma Glucose 117 mg/dL    Comment: (NOTE) Performed At: [facility] [place], [place] [id number] Lindon Romp MD EM:[id number]   Lipid panel     Status: None   Collection Time: 12/12/15 12:10 PM  Result Value Ref Range   Cholesterol 157 0 - 200 mg/dL   Triglycerides 149 <150 mg/dL   HDL 46 >40 mg/dL   Total CHOL/HDL Ratio 3.4 RATIO   VLDL 30 0 - 40 mg/dL   LDL Cholesterol 81 0 - 99 mg/dL    Comment:        Total Cholesterol/HDL:CHD Risk Coronary Heart Disease Risk Table                     Men   Women  1/2 Average Risk   3.4   3.3  Average Risk       5.0   4.4  2 X Average Risk   9.6   7.1  3 X Average Risk  23.4   11.0        Use the calculated Patient Ratio above and the CHD Risk Table to determine the patient's CHD Risk.        ATP III CLASSIFICATION (LDL):  <100     mg/dL   Optimal  100-129  mg/dL   Near or Above                    Optimal  130-159  mg/dL   Borderline  160-189  mg/dL   High  >190     mg/dL   Very High   Basic metabolic panel     Status: Abnormal   Collection Time: 12/12/15 12:10 PM  Result Value Ref Range   Sodium 138 135 - 145 mmol/L   Potassium 3.4 (L) 3.5 - 5.1 mmol/L   Chloride 97 (L) 101 - 111 mmol/L   CO2 30 22 - 32 mmol/L   Glucose, Bld 83 65 - 99 mg/dL   BUN 32 (H) 6 - 20 mg/dL   Creatinine, Ser 2.37 (H) 0.61 - 1.24 mg/dL   Calcium 10.1 8.9 - 10.3 mg/dL   GFR calc non Af Amer 27 (L) >60 mL/min  GFR calc Af Amer 31 (L) >60 mL/min    Comment: (NOTE) The eGFR has been calculated using the CKD EPI equation. This  calculation has not been validated in all clinical situations. eGFR's persistently <60 mL/min signify possible Chronic Kidney Disease.    Anion gap 11 5 - 15  Glucose, capillary     Status: None   Collection Time: 12/12/15  1:00 PM  Result Value Ref Range   Glucose-Capillary 85 65 - 99 mg/dL   Comment 1 Notify RN   Glucose, capillary     Status: Abnormal   Collection Time: 12/12/15  5:01 PM  Result Value Ref Range   Glucose-Capillary 107 (H) 65 - 99 mg/dL   Comment 1 Notify RN   Glucose, capillary     Status: None   Collection Time: 12/12/15  9:34 PM  Result Value Ref Range   Glucose-Capillary 92 65 - 99 mg/dL  CBC     Status: Abnormal   Collection Time: 12/13/15  3:05 AM  Result Value Ref Range   WBC 6.9 4.0 - 10.5 K/uL   RBC 4.42 4.22 - 5.81 MIL/uL   Hemoglobin 12.6 (L) 13.0 - 17.0 g/dL   HCT 39.5 39.0 - 52.0 %   MCV 89.4 78.0 - 100.0 fL   MCH 28.5 26.0 - 34.0 pg   MCHC 31.9 30.0 - 36.0 g/dL   RDW 17.7 (H) 11.5 - 15.5 %   Platelets 296 150 - 400 K/uL  Comprehensive metabolic panel     Status: Abnormal   Collection Time: 12/13/15  3:05 AM  Result Value Ref Range   Sodium 139 135 - 145 mmol/L   Potassium 3.5 3.5 - 5.1 mmol/L   Chloride 96 (L) 101 - 111 mmol/L   CO2 30 22 - 32 mmol/L   Glucose, Bld 85 65 - 99 mg/dL   BUN 39 (H) 6 - 20 mg/dL   Creatinine, Ser 2.56 (H) 0.61 - 1.24 mg/dL   Calcium 9.8 8.9 - 10.3 mg/dL   Total Protein 7.9 6.5 - 8.1 g/dL   Albumin 3.7 3.5 - 5.0 g/dL   AST 29 15 - 41 U/L   ALT 19 17 - 63 U/L   Alkaline Phosphatase 73 38 - 126 U/L   Total Bilirubin 0.5 0.3 - 1.2 mg/dL   GFR calc non Af Amer 25 (L) >60 mL/min   GFR calc Af Amer 29 (L) >60 mL/min    Comment: (NOTE) The eGFR has been calculated using the CKD EPI equation. This calculation has not been validated in all clinical situations. eGFR's persistently <60 mL/min signify possible Chronic Kidney Disease.    Anion gap 13 5 - 15  Glucose, capillary     Status: None   Collection Time:  12/13/15  6:29 AM  Result Value Ref Range   Glucose-Capillary 93 65 - 99 mg/dL     Lipid Panel     Component Value Date/Time   CHOL 157 12/12/2015 1210   TRIG 149 12/12/2015 1210   HDL 46 12/12/2015 1210   CHOLHDL 3.4 12/12/2015 1210   VLDL 30 12/12/2015 1210   LDLCALC 81 12/12/2015 1210     Lab Results  Component Value Date   HGBA1C 5.7* 12/12/2015   HGBA1C 5.9* 09/02/2015   HGBA1C 5.6 01/20/2015     Lab Results  Component Value Date   MICROALBUR 0.4 01/20/2015   LDLCALC 81 12/12/2015   CREATININE 2.56* 12/13/2015     HPI : 65 y.o. male with hx  of obesity, PAF, HTN, COPD, HTN, gout, DM 2, CVA and CHF EF 25% due to NICM. Last seen in HF clinic 10/30/15, at that time volume status was stable on high dose torsemide with metolazone twice a week (up to three times as needed). We had strongly encouraged him to get a shunt placed in preparation for dialysis as recommended by Dr Mercy Moore.   He presented to [facility] 12/11/15 via EMS complaining of SOB despite home nebs x 2, 5 lb weight gain overnight, and + orthopnea. Pertinent admission labs include K 4.5, Creatinine 2.44, BNP 1024, and flat troponins. CXR showed vascular congestion with mild cardiomegaly with mild bilateral atelectasis. UO ok over past 12 hours, though he has received total of 140 mg IV lasix.   Feels much better this am. Already down 5 lbs. States he has not been reseen for shunt placement or Bipap titration. He was sick for several weeks with a "bad cold" and was treated with clindamycin and prednisone taper, which he finished last week. Has been taking all of his medications as directed. Took 2 metolazone yesterday. Of note, on Wednesday night had several helpings of "homemade chicken soup". He states he doesn't add salt but his sister made this. Drinks about 1.5 L of fluid daily. Weight had been as low as 218 at home,   HOSPITAL COURSE:  Acute on chronic systolic CHF (congestive heart failure) [facility]): Patient of  [facility] presents with shortness of breath and 5 pound weight gain. 08/2015 EF 25-30%. BnP elevated at 1024. Patient received IV Lasix 80 mg , Continue telemetry consulted  cardiology   patient was 225 pounds > 219 pounds on the day of discharge currently 98% on room air Continue isosorbide mononitrate, Hydralazine, Coreg, and metolazone , demadex follow up with HF clinic on 12/16/15. Strongly recommended he follow up for Bipap  PAF (paroxysmal atrial fibrillation) with Chronic anticoagulation - Continue Eliquis   Essential hypertension - cont. medicines as listed above   Type 2 diabetes mellitus with stage 4 chronic kidney disease, without long-term current use of insulin (HCC) - Discontinued glipizide while hospitalized to avoid hypoglycemia - CBGs every 4 hours overnight with sensitive sliding scale insulin  was stable resume oral hypoglycemics    CKD (chronic kidney disease), stage IV [facility]): Patient near his baseline creatinine 2.4- 3. Creatinine  remained stable around 2.56 Monitor renal function closely  Discharge Exam:    Blood pressure 115/72, pulse 79, temperature 97.9 F (36.6 C), temperature source Oral, resp. rate 18, height '5\' 10"'  (1.778 m), weight 99.428 kg (219 lb 3.2 oz), SpO2 98 %. General: Obese male; no resp difficutly HEENT: normal Neck: supple. Thick, JVP difficult to assess, but appears 6-7 cm. Carotids 2+ bilaterally; no bruits. No thyromegaly or nodule noted. Cor: PMI nonpalpable. RRR. No M/G/R appreciated Lungs: Slightly diminished breath sounds, otherwise clear. Normal effort. Abdomen: obese. soft, NT, mild distention, no HSM. No bruits or masses. +BS  Extremities: no cyanosis, clubbing, rash. Trace- ankle edema. Skinny legs.  Neuro: alert & orientedx3, cranial nerves grossly intact. Affect pleasant     Follow-up Information    Follow up with Glori Bickers, MD On 12/16/2015.   Specialty:  Cardiology   Why:  at 220 for post  hospital follow up. Please bring all of your medications to your visit. The code for parking is 1000.   Contact information:   [address] [place] [address] [phone]       Follow up with  COPLAND,JESSICA, MD. Schedule an appointment as soon as possible for a visit in 1 week.   Specialty:  Family Medicine   Contact information:   [place] [address] [phone]       Signed: Reyne Dumas 12/13/2015, 10:33 AM        Time spent >45 mins

## 2015-12-15 ENCOUNTER — Other Ambulatory Visit: Payer: Self-pay | Admitting: *Deleted

## 2015-12-15 NOTE — Patient Outreach (Signed)
Covering for L. Ashley Royalty.  Member discharged on Saturday, 1/21 after being admitted for congestive heart failure.  Initial transition of care call placed to member [phone]), no answer.  Unable to leave a voice message.  Will make another attempt tomorrow and update assigned care manager upon return.  Kemper Durie, BSN, [facility] Care Management  [facility] Care Manager [phone]

## 2015-12-16 ENCOUNTER — Ambulatory Visit (HOSPITAL_COMMUNITY)
Admission: RE | Admit: 2015-12-16 | Discharge: 2015-12-16 | Disposition: A | Discharge status: Home / Self Care | Payer: Commercial Managed Care - HMO | Source: Ambulatory Visit | Attending: Internal Medicine | Admitting: Internal Medicine

## 2015-12-16 VITALS — BP 120/78 | HR 85 | Wt 224.5 lb

## 2015-12-16 DIAGNOSIS — Z87891 Personal history of nicotine dependence: Secondary | ICD-10-CM | POA: Diagnosis not present

## 2015-12-16 DIAGNOSIS — G4733 Obstructive sleep apnea (adult) (pediatric): Secondary | ICD-10-CM | POA: Diagnosis not present

## 2015-12-16 DIAGNOSIS — E785 Hyperlipidemia, unspecified: Secondary | ICD-10-CM | POA: Insufficient documentation

## 2015-12-16 DIAGNOSIS — N184 Chronic kidney disease, stage 4 (severe): Secondary | ICD-10-CM | POA: Diagnosis not present

## 2015-12-16 DIAGNOSIS — M109 Gout, unspecified: Secondary | ICD-10-CM | POA: Diagnosis not present

## 2015-12-16 DIAGNOSIS — R413 Other amnesia: Secondary | ICD-10-CM | POA: Insufficient documentation

## 2015-12-16 DIAGNOSIS — I480 Paroxysmal atrial fibrillation: Secondary | ICD-10-CM | POA: Diagnosis not present

## 2015-12-16 DIAGNOSIS — N183 Chronic kidney disease, stage 3 (moderate): Secondary | ICD-10-CM | POA: Insufficient documentation

## 2015-12-16 DIAGNOSIS — E669 Obesity, unspecified: Secondary | ICD-10-CM | POA: Diagnosis not present

## 2015-12-16 DIAGNOSIS — I5022 Chronic systolic (congestive) heart failure: Secondary | ICD-10-CM | POA: Insufficient documentation

## 2015-12-16 DIAGNOSIS — I428 Other cardiomyopathies: Secondary | ICD-10-CM | POA: Diagnosis not present

## 2015-12-16 DIAGNOSIS — Z7902 Long term (current) use of antithrombotics/antiplatelets: Secondary | ICD-10-CM | POA: Diagnosis not present

## 2015-12-16 DIAGNOSIS — I739 Peripheral vascular disease, unspecified: Secondary | ICD-10-CM | POA: Insufficient documentation

## 2015-12-16 DIAGNOSIS — Z833 Family history of diabetes mellitus: Secondary | ICD-10-CM | POA: Insufficient documentation

## 2015-12-16 DIAGNOSIS — E1122 Type 2 diabetes mellitus with diabetic chronic kidney disease: Secondary | ICD-10-CM | POA: Diagnosis not present

## 2015-12-16 DIAGNOSIS — I130 Hypertensive heart and chronic kidney disease with heart failure and stage 1 through stage 4 chronic kidney disease, or unspecified chronic kidney disease: Secondary | ICD-10-CM | POA: Insufficient documentation

## 2015-12-16 DIAGNOSIS — Z8673 Personal history of transient ischemic attack (TIA), and cerebral infarction without residual deficits: Secondary | ICD-10-CM | POA: Diagnosis not present

## 2015-12-16 DIAGNOSIS — G8929 Other chronic pain: Secondary | ICD-10-CM | POA: Diagnosis not present

## 2015-12-16 DIAGNOSIS — J449 Chronic obstructive pulmonary disease, unspecified: Secondary | ICD-10-CM | POA: Diagnosis not present

## 2015-12-16 DIAGNOSIS — G4730 Sleep apnea, unspecified: Secondary | ICD-10-CM | POA: Insufficient documentation

## 2015-12-16 DIAGNOSIS — K219 Gastro-esophageal reflux disease without esophagitis: Secondary | ICD-10-CM | POA: Diagnosis not present

## 2015-12-16 DIAGNOSIS — J45909 Unspecified asthma, uncomplicated: Secondary | ICD-10-CM | POA: Insufficient documentation

## 2015-12-16 DIAGNOSIS — M1990 Unspecified osteoarthritis, unspecified site: Secondary | ICD-10-CM | POA: Diagnosis not present

## 2015-12-16 DIAGNOSIS — Z7984 Long term (current) use of oral hypoglycemic drugs: Secondary | ICD-10-CM | POA: Diagnosis not present

## 2015-12-16 DIAGNOSIS — M797 Fibromyalgia: Secondary | ICD-10-CM | POA: Insufficient documentation

## 2015-12-16 DIAGNOSIS — Z79899 Other long term (current) drug therapy: Secondary | ICD-10-CM | POA: Diagnosis not present

## 2015-12-16 NOTE — Progress Notes (Signed)
Patient ID: Frank Davidson, male   DOB: 21-Sep-1951, 65 y.o.   MRN: [id number]    [facility]  PCP: Dr. Shela Commons Davidson Nephrologist: Dr. Briant Davidson HF: Frank Davidson  HPI: Frank Davidson is a 65 y.o. male with a hx of obesity, PAF, HTN, COPD, HTN, gout, DM 2, CVA and CHF EF 25% due to NICM. Intolerant Entresto at the lowest dose due to syncope.   Has previously followed in the [facility] in [place]. Previous cath in 2007 showed NICM with normal cors. Was living alone in [place] and apparently had issues managing medications by himself and was getting confused with PCP and cardiologists medications and was not taking his meds correctly. He moved to [place] to be near his nieces. Issues in the past with ACE-I due to renal failure. Highest creatinine in our system is ~1.5 but was apparently higher previously - thought to be related to taking his medications incorrectly.   Was admitted in 06/2013 from [facility] with left sided weakness and was found to have a moderate to large right hemispheric infarct. Went back to [employer] [facility].   Admitted to [facility] 10/14 through 09/09/13 with ADHF. Diuresed 30 pounds with IV lasix and transitioned to lasix 80 mg po bid. Continued on 25 mg carvedilol bid, isordil 20 mg daily.  Started on lisinopril 2.5 mg daily. Discharge weight 229 pounds. 09/29/13 CT abdomen and pelvis - no obstruction . Diverticulosis. No inflammatory changes.   Admitted 11/23/13 through 11/26/13 with volume overload and dyspnea. Had worsening cardiorenal syndrome. Nephrology consulted and managed with IV lasix and transitioned to torsemide 40 mg twice a day.  Diuresed 25 pounds. Discharge weight 232 pounds. Hydralazine, Spiro, and Lisinopril stopped at discharge. He is also off digoxin.    Admitted 07/01/15 for AKI after presumed over diuresis. His Cr on admit was 4.6., though his baseline is 2.  We followed him in the hospital and held diuresis for several days.  Cr on discharge was  2.36. We decreased his spiro to 12.5 mg, and his diuretic regimen was continued at 40 mg torsemide BID.  Discharge weight was 229.  Admitted in 10/16 with acute on chronic systolic CHF and AKI with creatinine up to 7.1 => ATN.  Suspected cardiorenal syndrome, but surprisingly co-ox was normal.  CVVH was required but renal function gradually improved. D/c weight 212 pounds.  Admitted last week with recurrent volume overload after eating too much soup. Diuresed down to 219.   He returns for post-hospital  follow-up.  Feels much better. Trying to be better about his diet. Weighing every day. Weight 222 today. Takes torsemide 80 bid and metolazone 2x/week. Also takes extra metolazone as needed.   Denies dyspnea.  No bendopnea. Chronic 2 pillow orthopnea. No edema or dizziness. Saw Dr Frank Davidson end of November, kidneys stable but they still recommended getting a shunt placed in anticipation of dialysis he has deferred this,.   PFTs (9/16): Severe obstruction.    ECHO 11/08/13 EF 15%  ECHO 07/05/14: EF 15%  ECHO 11/26/2014: EF 20%  ECHO 10/16: EF 25-30%, mild LV dilation, diffuse hypokinesis, mild MR, mild RV dilation  08/13/14 RA = 7  RV = 50/3/10  PA = 54/22 (34)  PCW = 16  Fick cardiac output/index = 6.9/3.0  Thermo CO/CI = 4.7/2.1  PVR = 4.1 WU  Ao sat = 94%  PA sat = 68%,68%  SVC sat (sheath) = 62%  Labs 08/22/14 K 4.4 creatinine 1.6  09/02/14 Creatinine 1.6 10/20/14:  creatinine 2.58, K 5.1 11/23/2014: Creatinine 4.12 --Ace and Spiro stopped 11/24/2014: Creatinine 3.55 1/4/12016: 2.49  11/26/2014: Hgb 10.4 Hct 31.7 Creatinine 1.94  12/03/14: K 4, creatinine 2.25 02/19/2015: K 3.8 Creatinine 2.66  05/05/2015: K 3.4 Creatinine 2.68   8/16: K 3.5 Creatinine 2.36 => 2.84, hgb 10.4, BNP 523 10/16: K 3.7, creatinine 2.57 => 2.6 => 2.8 09/12/15: K 3.7, creatinine 2.82, BUN 79 09/15/15: K 3.8, creatinine 2.84, BUN 77 12/03/15: K 3.5, creatinine 2.56, BUN 39  SH: Retired Engineer, site 2011 Lives  with his niece  FH: Father DM  Mom had lung cancer  ROS: All systems negative except as listed in HPI, PMH and Problem List.  Past Medical History  Diagnosis Date  . Atrial fibrillation (HCC)     PAF  . Hypertension   . COPD (chronic obstructive pulmonary disease) (HCC)   . Ocular herpes   . Gout   . CHF (congestive heart failure) (HCC)   . Chronic pain   . Hyperlipidemia   . Nonischemic cardiomyopathy (HCC) Aug 2015    EF 15%  . Ocular herpes   . Neuropathy (HCC)   . Left-sided weakness     "because of the arthritis and edema"  . PVD (peripheral vascular disease) [facility]) May 2015    abnormal ABIs  . Memory impairment     "short term"  . CKD (chronic kidney disease), stage III     Dr. Briant Davidson follows-holding   . Fibromyalgia     neuropathy- hands, more than feet.  . Sleep apnea with use of continuous positive airway pressure (CPAP)     does not use cpap  . GERD (gastroesophageal reflux disease)     tums as needed  . Asthma   . Type II diabetes mellitus (HCC)   . Stroke [facility]) X 5    last stroke aug 2014"; denies residual on 09/12/2014  . Osteoarthritis   . Arthritis     "knees, right elbow, shoulder" (09/12/2014)  . Shortness of breath dyspnea     Current Outpatient Prescriptions  Medication Sig Dispense Refill  . albuterol (PROVENTIL HFA;VENTOLIN HFA) 108 (90 BASE) MCG/ACT inhaler Inhale 2 puffs into the lungs every 6 (six) hours as needed for wheezing or shortness of breath. 1 Inhaler 1  . albuterol (PROVENTIL) (2.5 MG/3ML) 0.083% nebulizer solution Take 3 mLs (2.5 mg total) by nebulization every 4 (four) hours as needed for wheezing or shortness of breath. 150 vial 5  . allopurinol (ZYLOPRIM) 100 MG tablet TAKE 1 TABLET (100 MG TOTAL) BY MOUTH DAILY. 90 tablet 1  . apixaban (ELIQUIS) 5 MG TABS tablet Take 1 tablet (5 mg total) by mouth 2 (two) times daily. 180 tablet 3  . baclofen (LIORESAL) 10 MG tablet TAKE 1 TABLET EVERY 8 HOURS AS NEEDED FOR MUSCLE SPASM(S) 90  tablet 1  . carvedilol (COREG) 25 MG tablet Take 12.5 mg by mouth 2 (two) times daily with a meal.    . diclofenac (FLECTOR) 1.3 % PTCH Place 1 patch onto the skin 2 (two) times daily as needed (pain). 180 patch 3  . docusate sodium 100 MG CAPS Take 100 mg by mouth 2 (two) times daily. 10 capsule 0  . glipiZIDE (GLUCOTROL) 5 MG tablet Take 5 mg by mouth 2 (two) times daily before a meal.    . hydrALAZINE (APRESOLINE) 50 MG tablet Take 1 tablet (50 mg total) by mouth 3 (three) times daily. 90 tablet 3  . isosorbide mononitrate (IMDUR) 60 MG 24  hr tablet Take 60 mg by mouth daily.    . metolazone (ZAROXOLYN) 2.5 MG tablet Take 1 tablet (2.5 mg total) by mouth 2 (two) times a week. Tuesday and Friday 15 tablet 3  . omega-3 acid ethyl esters (LOVAZA) 1 G capsule Take 1 g by mouth daily.    Marland Kitchen oxyCODONE-acetaminophen (PERCOCET) 10-325 MG tablet Take 1 tablet by mouth every 8 (eight) hours as needed for pain. Use with stool softerner. 60 tablet 0  . polyethylene glycol powder (GLYCOLAX/MIRALAX) powder MIX 1 CAPFUL (17GM)  IN  LIQUID  AND  DRINK EVERY DAY AS DIRECTED 1530 g 3  . potassium chloride SA (K-DUR,KLOR-CON) 20 MEQ tablet Take 2 tabs in AM and 1 tab in PM 90 tablet 5  . tiotropium (SPIRIVA HANDIHALER) 18 MCG inhalation capsule Place 1 capsule (18 mcg total) into inhaler and inhale daily. 90 capsule 2  . torsemide (DEMADEX) 20 MG tablet TAKE 4 TABLETS ( )  TWICE DAILY (CHANGE IN DOSE) 720 tablet 3   No current facility-administered medications for this encounter.    Filed Vitals:   12/16/15 1403  BP: 120/78  Pulse: 85  Weight: 224 lb 8 oz (101.833 kg)  SpO2: 95%     Wt Readings from Last 3 Encounters:  12/16/15 224 lb 8 oz (101.833 kg)  12/13/15 219 lb 3.2 oz (99.428 kg)  11/26/15 234 lb (106.142 kg)    PHYSICAL EXAM: General:  Obese male; no resp difficutly HEENT: normal Neck: supple. Thick, JVP difficult to assess, but does not appear elevated. Carotids 2+ bilaterally; no  bruits. No thyromegaly or nodule noted. Cor: PMI nonpalpable. RRR. No M/G/R appreciated Lungs: Clear, regular with normal respiratory effort Abdomen: obese. soft, nontender, + distention. No HSM. No bruits or masses. Bowel sounds x 4. Extremities: no cyanosis, clubbing, rash. no edema.  Neuro: alert & orientedx3, cranial nerves grossly intact. Affect pleasant.  ASSESSMENT & PLAN:  1) Chronic Systolic Heart Failure: Nonischemic cardiomyopathy, EF 25-30% in 10/16. NYHA II-II symptoms.  He refuses consideration for ICD.   -s/p recent hospitalization for volume overload. Volume looks good today. - Continue torsemide 80 mg BID and metolazone 2.5 mg 30 minutes before am torsemide on Tuesdays and Fridays. - Continue to take extra metolazone as needed (max of 3 total per week). Check BMET  - Continue coreg 12.5 mg BID.  - Continue Imdur + hydralazine 50 mg tid.    - Off digoxin, losartan, and spiro due to CKD. BMET today.  - Instructed to continue daily weights, fluid intake < 2L daily, and to keep Na intake to < 2000 mg daily.  - Knows to call the HF clinic if weight increases more than 3 lbs overnight or 5 lbs in a week.  - Also followed by Paramedicine. 2) Gout: Continue allopurinol. Follow with rheumatology.  3) Atrial fibrillation: Paroxysmal. Continue Eliquis.  4) CKD stage IV:  - Near baseline at last check.  - Saw Dr Frank Davidson at end of November. Kidneys stable but would like him to get a shunt placed in anticipation of dialysis. Pt is very hesitant. Encouraged to consider.  5) COPD:  - Severe obstruction on PFTs.   - Qualifies for but refuses home 02.  - Prior smoker. -  Sees Dr Kriste Basque. On [id number] and spiriva with albuterol prn. 6) Suspected OSA: Sleep study with AHI of 60 but does not have CPAP yet. Encouraged him to f/u with Dr. Marlowe Alt, Hailyn Zarr,MD 2:29 PM

## 2015-12-16 NOTE — Patient Instructions (Signed)
Labs in 1 week  We will contact you in 2 months to schedule your next appointment.

## 2015-12-16 NOTE — Addendum Note (Signed)
Encounter addended by: Noralee Space, RN on: 12/16/2015  2:42 PM<BR>     Documentation filed: Patient Instructions Section

## 2015-12-17 ENCOUNTER — Other Ambulatory Visit: Payer: Self-pay | Admitting: Pharmacist

## 2015-12-17 ENCOUNTER — Encounter: Payer: Self-pay | Admitting: Family Medicine

## 2015-12-17 ENCOUNTER — Other Ambulatory Visit: Payer: Self-pay | Admitting: *Deleted

## 2015-12-17 NOTE — Patient Outreach (Signed)
[facility]) Care Management  12/17/2015  JAYSE OBST [birth year] [id number]  Transition of care (week 1/ 2nd attempt)  RN attempted to reach pt today due to recent discharge from the hospital however no answer to the available contact number. Will continue outreach attempts for [facility] services.   [name], RN Care Management Coordinator [facility] Main Office [phone]

## 2015-12-17 NOTE — Patient Outreach (Signed)
[facility]) Care Management  [facility]   12/17/2015  Frank Davidson 10/05/1951 [id number]  Subjective: Raequon Zettler is a 65yo with a recent hospitalization from 12/11/15 to 12/13/15 for acute on chronic systolic heart failure (EF 25-30% on 08/28/15).  I identified patient as eligible for pharmacy transition of care project.  Chart reviewed prior to initiation of call and medication review completed.  I called patient and read consent.  Patient agreed to participate in transition of care project.  I reviewed all medications with the patient.  Patient reports adherence with medications.    Patient reports weighing daily as instructed.  Reports weight of 219lb today.  Denies shortness of breath.  Denies swelling.  Patient had hospital follow up with Dr. Gala Romney on 12/16/15.   Objective:   Current Medications: Current Outpatient Prescriptions  Medication Sig Dispense Refill  . albuterol (PROVENTIL HFA;VENTOLIN HFA) 108 (90 BASE) MCG/ACT inhaler Inhale 2 puffs into the lungs every 6 (six) hours as needed for wheezing or shortness of breath. 1 Inhaler 1  . albuterol (PROVENTIL) (2.5 MG/3ML) 0.083% nebulizer solution Take 3 mLs (2.5 mg total) by nebulization every 4 (four) hours as needed for wheezing or shortness of breath. 150 vial 5  . allopurinol (ZYLOPRIM) 100 MG tablet TAKE 1 TABLET (100 MG TOTAL) BY MOUTH DAILY. 90 tablet 1  . apixaban (ELIQUIS) 5 MG TABS tablet Take 1 tablet (5 mg total) by mouth 2 (two) times daily. 180 tablet 3  . baclofen (LIORESAL) 10 MG tablet TAKE 1 TABLET EVERY 8 HOURS AS NEEDED FOR MUSCLE SPASM(S) 90 tablet 1  . carvedilol (COREG) 25 MG tablet Take 12.5 mg by mouth 2 (two) times daily with a meal.    . diclofenac (FLECTOR) 1.3 % PTCH Place 1 patch onto the skin 2 (two) times daily as needed (pain). 180 patch 3  . docusate sodium 100 MG CAPS Take 100 mg by mouth 2 (two) times daily. 10 capsule 0  . glipiZIDE (GLUCOTROL) 5 MG tablet Take  5 mg by mouth 2 (two) times daily before a meal.    . hydrALAZINE (APRESOLINE) 50 MG tablet Take 1 tablet (50 mg total) by mouth 3 (three) times daily. 90 tablet 3  . isosorbide mononitrate (IMDUR) 60 MG 24 hr tablet Take 60 mg by mouth daily.    . metolazone (ZAROXOLYN) 2.5 MG tablet Take 1 tablet (2.5 mg total) by mouth 2 (two) times a week. Tuesday and Friday 15 tablet 3  . Multiple Vitamins-Minerals (MULTIVITAMIN WITH MINERALS) tablet Take 1 tablet by mouth daily.    Marland Kitchen omega-3 acid ethyl esters (LOVAZA) 1 G capsule Take 1 g by mouth daily.    Marland Kitchen oxyCODONE-acetaminophen (PERCOCET) 10-325 MG tablet Take 1 tablet by mouth every 8 (eight) hours as needed for pain. Use with stool softerner. 60 tablet 0  . polyethylene glycol powder (GLYCOLAX/MIRALAX) powder MIX 1 CAPFUL (17GM)  IN  LIQUID  AND  DRINK EVERY DAY AS DIRECTED 1530 g 3  . potassium chloride SA (K-DUR,KLOR-CON) 20 MEQ tablet Take 2 tabs in AM and 1 tab in PM 90 tablet 5  . tiotropium (SPIRIVA HANDIHALER) 18 MCG inhalation capsule Place 1 capsule (18 mcg total) into inhaler and inhale daily. 90 capsule 2  . torsemide (DEMADEX) 20 MG tablet TAKE 4 TABLETS (80MG )  TWICE DAILY (CHANGE IN DOSE) 720 tablet 3   No current facility-administered medications for this visit.   Functional Status: In your present state of health, do you  have any difficulty performing the following activities: 08/27/2015 07/31/2015  Hearing? N N  Vision? Y N  Difficulty concentrating or making decisions? N N  Walking or climbing stairs? N N  Dressing or bathing? N N  Doing errands, shopping? N Y  Quarry manager and eating ? - N  Using the Toilet? - N  In the past six months, have you accidently leaked urine? - N  Do you have problems with loss of bowel control? - N  Managing your Medications? - N  Managing your Finances? - N  Housekeeping or managing your Housekeeping? - Y   Fall/Depression Screening: PHQ 2/9 Scores 11/13/2015 11/07/2015 10/15/2015 09/12/2015  09/04/2015 08/27/2015 08/22/2015  PHQ - 2 Score 0 0 0 0 0 0 0    Assessment: 1.  Medication review:  Drugs sorted by system:  Neurologic/Psychologic: none  Cardiovascular: apixaban, carvedilol, hydralazine, isosorbide mononitrate, metolazone, omega-3-acid Ethyl Esters, potassium, torsemide  Pulmonary/Allergy: albuterol, tiotropium  Gastrointestinal: docusate, polyethylene glycol  Endocrine: glipizide  Renal: none  Topical: none  Pain: baclofen, diclofenac patches, oxycodone-acetaminophen  Vitamins/Minerals: none  Infectious Diseases: none  Miscellaneous: allopurinol   Duplications in therapy: none noted Gaps in therapy: ACE inhibitor or ARB for LVSD; statin indicated due to history of clinical ASCVD (CVA in 2014) Medications to avoid in the elderly: none noted Drug interactions: none noted Other issues noted: none noted   2.  Medication adherence:  Patient reports compliance with medications.    Plan: 1.  Medication review:  All medications appear appropriate based on patient's problem list.  Patient has LVSD which is an indication for ACE inhibitor or ARB use.  Patient is not currently on an ACE inhibitor or ARB due to renal failure.  Unless the renal failure is attributed to ACEi/ARB use, these medications are still indicated.  In addition, patient has history of CVA and is not currently on statin therapy.  Please consider initiation of high intensity statin therapy.  Will send a fax with this information to patient's PCP.    2.  Medication adherence:  Counseled patient to continue to take medications as prescribed.    Lilla Shook, Pharm.D. Pharmacy Resident [facility] [phone]

## 2015-12-18 ENCOUNTER — Encounter: Payer: Self-pay | Admitting: *Deleted

## 2015-12-18 NOTE — Progress Notes (Signed)
This encounter was created in error - please disregard.

## 2015-12-19 ENCOUNTER — Encounter: Payer: Self-pay | Admitting: Pharmacist

## 2015-12-23 ENCOUNTER — Other Ambulatory Visit: Payer: Self-pay | Admitting: *Deleted

## 2015-12-23 ENCOUNTER — Encounter: Payer: Self-pay | Admitting: Pulmonary Disease

## 2015-12-23 ENCOUNTER — Ambulatory Visit: Payer: Commercial Managed Care - HMO | Admitting: Pulmonary Disease

## 2015-12-23 VITALS — BP 124/78 | HR 80 | Temp 97.5°F | Ht 70.0 in | Wt 220.4 lb

## 2015-12-23 DIAGNOSIS — I10 Essential (primary) hypertension: Secondary | ICD-10-CM

## 2015-12-23 DIAGNOSIS — N184 Chronic kidney disease, stage 4 (severe): Secondary | ICD-10-CM

## 2015-12-23 DIAGNOSIS — J449 Chronic obstructive pulmonary disease, unspecified: Secondary | ICD-10-CM

## 2015-12-23 DIAGNOSIS — G894 Chronic pain syndrome: Secondary | ICD-10-CM

## 2015-12-23 DIAGNOSIS — I429 Cardiomyopathy, unspecified: Secondary | ICD-10-CM

## 2015-12-23 DIAGNOSIS — I428 Other cardiomyopathies: Secondary | ICD-10-CM

## 2015-12-23 DIAGNOSIS — I5022 Chronic systolic (congestive) heart failure: Secondary | ICD-10-CM

## 2015-12-23 DIAGNOSIS — G629 Polyneuropathy, unspecified: Secondary | ICD-10-CM

## 2015-12-23 DIAGNOSIS — I480 Paroxysmal atrial fibrillation: Secondary | ICD-10-CM

## 2015-12-23 DIAGNOSIS — E1122 Type 2 diabetes mellitus with diabetic chronic kidney disease: Secondary | ICD-10-CM

## 2015-12-23 MED ORDER — IPRATROPIUM-ALBUTEROL 0.5-2.5 (3) MG/3ML IN SOLN: 3.0000 mL | Freq: Four times a day (QID) | RESPIRATORY_TRACT | Status: AC

## 2015-12-23 NOTE — Patient Instructions (Signed)
Today we updated your med list in our EPIC system...     We decided to change your breathing inhalers to medication you use in the NEBULIZER machine...    Start the new DUONEB in the nebulizer 4 times daily at breakfast, lunch, dinner, and bedtime...  We are hoping this will be less expensive for you thru your insurance company...  Call for any questions...  Let's plan a follow up visit in 55mo, sooner if needed for breathing problems.Marland KitchenMarland Kitchen

## 2015-12-23 NOTE — Progress Notes (Signed)
Subjective:     Patient ID: Frank Davidson, male   DOB: Dec 26, 1950, 65 y.o.   MRN: [id number]  HPI  ~  September 29, 2015:  Initial pulmonary consultation by SN>  His PCP is Dr. Shanda Bumps Copland, [facility]...    Frank Davidson is a 65 y/o gentleman w/ hx of COPD, HBP, non-ischemic cardiomyopathy w/ CHF, PAF, DM2, neuropathy, obesity, renal insuffic w/ Cr~2.8, hx stroke, memory loss, chronic pain syndrome, etc;  He is referred by the CHF clinic for a pulmonary evaluation;  His medical hx from 2014-2016 is nicely outlined in the CHF clinic notes (last 09/22/15) indicating 2DEcho EF was 15% in 2014 & improved to 25-30% by 1-/16...     He presents c/o incr SOB w/ wheezing "over the past 3 weeks" he says; notes his PCP diagnosed asthma & COPD, but he feels that it is weather related; he gives a disjointed hx of SOB in "attacks- when I try to move around and when I try to sleep"; symptoms are intermittent, off & on, "sometimes I have no prob at all", he denies cough, sputum, hemoptysis, but often notes wheezing; when pressed for a description of his dyspnea- he notes it's more like he can't get the air "IN", not getting enough air, can't walk, etc...   Smoking Hx>  He is a former smoker, started in his 29s, smoked for 35 yrs but only up to 1/2 ppd he says (never got to 1ppd), quit for good in 2009 because "I got tired of it" he says...   Pulmonary Hx>  He says he's been told of asthma & COPD; ?dx w/ [facility] in [place] but they didn't rec CPAP (DrCopland has sched a Sleep Consult w/ DrDohmeier soon)...  Medical Hx>  HBP, non-ischemic cardiomyopathy w/ CHF, PAF, DM2, neuropathy, obesity, renal insuffic w/ Cr~2.8, hx stroke, memory loss, chr pain syndrome, etc; note hx of drug abuse in past.  Family Hx>  Positive for lung dis in mother who was a smoker & died at 73 of lung cancer; he has a brother w/ Emphysema who was in [place]  Occup Hx>  Disabled   Current Meds>  Albuterol HFA prn and Spiriva recently started (pt  not using)...  EXAM shows Afeb, VSS, O2sat=93% on RA; wt=225#, 5'9"Tall, BMI=33; HEENT- neg, mallampati2;  Chest- decr BS at bases, otherw clear;  Heart- RR w/o m/r/g;  Abd- obese, soft, neg;  Ext- 1+edema w/o c/c; Neuro- no focal deficits...  CXR 08/27/15 showed cardiomegaly, sl incr markings/ scarring in mid-zones,   Full PFT 08/13/15 showed FVC=2.14 (61%), FEV1=1.37 (51%), %1sec=64, mid-flows reduced at 29% pred; there was an 8% improvement in FEV1 after bronchodil; TLC=4.73 (73%), RV=2/16 (98%), RV/TLC=46; DLCO=48% predicted.  Ambulatory oxygen saturation test 09/29/15 on RA> O2sat=94% at rest w/ pulse=83/min; he ambulated only 1 lap w/ lowest O2sat=90% w/ pulse=110/min  LABS 08/2015>  K=3.1, BUN=60, Cr=2.08, BS=115, BNP=501, Hg=11.9, MCV=84,  IMP/PLAN>>  Frank Davidson has mod airflow obstruction and GOLD Stage 2-3 COPD;  He would benefit from combined bronchodil Rx w/ ADVAIR250-Bid & SPIRIVA once daily;  He should continue his AlbutHFA rescue inhaler as needed;  I feel that his cardiac problems/ CHF/ periph vasc dis/ prev stroke/ renal insuffic are more pressing issues for him;  I encouraged wt reduction and incr exercise as tol... We plan ROV in 6 weeks.  ~  December 23, 2015:  2-45mo ROV & pulm foloow up>        Past Medical History  Diagnosis Date  .  Atrial fibrillation (HCC) >> on Eliquis, Coreg     PAF  . Hypertension >> on Imdur, Coreg, Hydralazine, Demadex, Zaroxyln, Potassium   . COPD (chronic obstructive pulmonary disease) (HCC) >> on ProventilHFA prn   . Ocular herpes   . Gout   . CHF (congestive heart failure) >> on Coreg, Hydralazine, Demadex, Zaroxyln, Potassium   . Chronic pain   . Hyperlipidemia   . Nonischemic cardiomyopathy (HCC) Aug 2015    EF 15% ==> 25-30%   . Ocular herpes   . Neuropathy (HCC)   . Left-sided weakness     "because of the arthritis and edema"  . PVD (peripheral vascular disease) [facility]) May 2015    abnormal ABIs  . Memory impairment     "short term"   . CKD (chronic kidney disease), stage III >> he required transient CVVH in hosp...     Dr. Briant Cedar follows-holding   . Fibromyalgia >> chronic pain syndrome on Diclofenac, Percocet, Lioresal...     neuropathy- hands, more than feet.  . Sleep apnea with prev use CPAP >> Sleep eval sched w/ DrDohmeier     does not use cpap  . GERD (gastroesophageal reflux disease)     tums as needed  . Asthma   . Type II diabetes mellitus (HCC) >> on Glipizide   . Stroke [facility]) X 5    last stroke aug 2014"; denies residual on 09/12/2014  . Osteoarthritis >> on Allopurinol for gout   . Arthritis     "knees, right elbow, shoulder" (09/12/2014)  . Shortness of breath dyspnea     Past Surgical History  Procedure Laterality Date  . 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 Tonsillectomy  age 75  . Esophagogastroduodenoscopy N/A 12/24/2013    Procedure: ESOPHAGOGASTRODUODENOSCOPY (EGD);  Surgeon: Louis Meckel, MD;  Location: [facility];  Service: Endoscopy;  Laterality: N/A;  . Colonoscopy N/A 12/24/2013    Procedure: COLONOSCOPY;  Surgeon: Louis Meckel, MD;  Location: [facility];  Service: Endoscopy;  Laterality: N/A;  . Joint replacement    . Cardiac catheterization  10/2006    normal coronary arteries;   Marland Kitchen Cataract extraction w/ intraocular lens  implant, bilateral Bilateral ~ 2008  . Total hip arthroplasty Left 09/12/2014    Procedure: LEFT TOTAL HIP ARTHROPLASTY ANTERIOR APPROACH;  Surgeon: [name], MD;  Location[facility] OR;  Service: Orthopedics;  Laterality: Left;  . Right heart catheterization N/A 08/13/2014    Rt ht cath prior to hip surgery    Outpatient Encounter Prescriptions as of 12/23/2015  Medication Sig  . albuterol (PROVENTIL HFA;VENTOLIN HFA) 108 (90 BASE) MCG/ACT inhaler Inhale 2 puffs into the lungs every 6 (six) hours as needed for wheezing or shortness of breath.  Marland Kitchen albuterol (PROVENTIL) (2.5 MG/3ML) 0.083% nebulizer solution Take 3 mLs (2.5 mg total) by nebulization every 4  (four) hours as needed for wheezing or shortness of breath.  . allopurinol (ZYLOPRIM) 100 MG tablet TAKE 1 TABLET (100 MG TOTAL) BY MOUTH DAILY.  Marland Kitchen apixaban (ELIQUIS) 5 MG TABS tablet Take 1 tablet (5 mg total) by mouth 2 (two) times daily.  . baclofen (LIORESAL) 10 MG tablet TAKE 1 TABLET EVERY 8 HOURS AS NEEDED FOR MUSCLE SPASM(S)  . carvedilol (COREG) 25 MG tablet Take 12.5 mg by mouth 2 (two) times daily with a meal.  . diclofenac (FLECTOR) 1.3 % PTCH Place 1 patch onto the skin 2 (two) times daily as needed (pain).  Marland Kitchen docusate  sodium 100 MG CAPS Take 100 mg by mouth 2 (two) times daily.  Marland Kitchen glipiZIDE (GLUCOTROL) 5 MG tablet Take 5 mg by mouth 2 (two) times daily before a meal.  . hydrALAZINE (APRESOLINE) 50 MG tablet Take 1 tablet (50 mg total) by mouth 3 (three) times daily.  . isosorbide mononitrate (IMDUR) 60 MG 24 hr tablet Take 60 mg by mouth daily.  . metolazone (ZAROXOLYN) 2.5 MG tablet Take 1 tablet (2.5 mg total) by mouth 2 (two) times a week. Tuesday and Friday  . Multiple Vitamins-Minerals (MULTIVITAMIN WITH MINERALS) tablet Take 1 tablet by mouth daily.  Marland Kitchen omega-3 acid ethyl esters (LOVAZA) 1 G capsule Take 1 g by mouth daily.  Marland Kitchen oxyCODONE-acetaminophen (PERCOCET) 10-325 MG tablet Take 1 tablet by mouth every 8 (eight) hours as needed for pain. Use with stool softerner.  . polyethylene glycol powder (GLYCOLAX/MIRALAX) powder MIX 1 CAPFUL (17GM)  IN  LIQUID  AND  DRINK EVERY DAY AS DIRECTED  . potassium chloride SA (K-DUR,KLOR-CON) 20 MEQ tablet Take 2 tabs in AM and 1 tab in PM  . tiotropium (SPIRIVA HANDIHALER) 18 MCG inhalation capsule Place 1 capsule (18 mcg total) into inhaler and inhale daily.  Marland Kitchen torsemide (DEMADEX) 20 MG tablet TAKE 4 TABLETS (80MG )  TWICE DAILY (CHANGE IN DOSE)    Allergies  Allergen Reactions  . Corlanor [Ivabradine] Palpitations and Other (See Comments)    Kidney and heart problems, syncope  . Entresto [Sacubitril-Valsartan]     Syncope  . Januvia  [Sitagliptin] Other (See Comments)    "Almost killed me"   . Lunesta [Eszopiclone] Palpitations and Other (See Comments)    Kidney and heart problems, syncope  . Actos [Pioglitazone] Other (See Comments)    Caused blood in urine and fluid retention     Immunization History  Administered Date(s) Administered  . Influenza,inj,Quad PF,36+ Mos 09/05/2013, 09/03/2015  . Influenza-Unspecified 08/29/2014  . Pneumococcal Conjugate-13 10/15/2015  . Pneumococcal-Unspecified 07/23/2013    Current Medications, Allergies, Past Medical History, Past Surgical History, Family History, and Social History were reviewed in [employer] recor   Review of Systems             All symptoms NEG except where BOLDED >>  Constitutional:  F/C/S, fatigue, anorexia, unexpected weight change. HEENT:  HA, visual changes, hearing loss, earache, nasal symptoms, sore throat, mouth sores, hoarseness. Resp:  cough, sputum, hemoptysis; SOB, tightness, wheezing. Cardio:  CP, palpit, DOE, orthopnea, edema. GI:  N/V/D/C, blood in stool; reflux, abd pain, distention, gas. GU:  dysuria, freq, urgency, hematuria, flank pain, voiding difficulty. MS:  joint pain, swelling, tenderness, decr ROM; neck pain, back pain, etc. Neuro:  HA, tremors, seizures, dizziness, syncope, weakness, numbness, gait abn. Skin:  suspicious lesions or skin rash. Heme:  adenopathy, bruising, bleeding. Psyche:  confusion, agitation, sleep disturbance, hallucinations, anxiety, depression suicidal.   Objective:   Physical Exam       Vital Signs:  Reviewed...  General:  WD, obese, 65 y/o BM in NAD; alert & oriented; pleasant & cooperative... HEENT:  [place]/AT; Conjunctiva- pink, Sclera- nonicteric, EOM-wnl, PERRLA, EACs-clear, TMs-wnl; NOSE-clear; THROAT-clear & wnl. Neck:  Supple w/ fair ROM; no JVD; normal carotid impulses w/o bruits; no thyromegaly or nodules palpated; no lymphadenopathy. Chest:  Decr BS at bases, clear to P &  A; without wheezes, rales, or rhonchi heard. Heart:  Regular Rhythm; without murmurs, rubs, or gallops detected. Abdomen:  Soft & nontender- no guarding or rebound; normal bowel sounds; no organomegaly  or masses palpated. Ext:  decrROM; without deformities, mild arthritic changes; no varicose veins, +venous insuffic,1+edema;  Pulses intact w/o bruits. Neuro:  No resid neuro deficits but +gait abn... Derm:  No lesions noted; no rash etc. Lymph:  No cervical, supraclavicular, axillary, or inguinal adenopathy palpated.   Assessment:      IMP >>     Mod airflow obstruction w/ GOLD Stage 2-3 COPD > we discussed Rx w/ ADVAIR250Bid & Spiriva daily, continue the AlbutHFA rescue inhaler prn...    Ex-smoker> he quit in 2009    HBP    Non-ischemic cardiomyopathy w/ CHF    PAF    DM2, neuropathy    Obesity    Renal insuffic w/ Cr~2.0-2.8    Hx stroke, memory loss    Chronic pain syndrome  PLAN >>     Frank Davidson has mod airflow obstruction and GOLD Stage 2-3 COPD;  He would benefit from combined bronchodil Rx w/ ADVAIR250-Bid & SPIRIVA once daily;  He should continue his AlbutHFA rescue inhaler as needed;  I feel that his cardiac problems/ CHF/ periph vasc dis/ prev stroke/ renal insuffic are more pressing issues for him;  I encouraged wt reduction and incr exercise as tolerated...     Plan:     Patient's Medications  New Prescriptions   IPRATROPIUM-ALBUTEROL (DUONEB) 0.5-2.5 (3) MG/3ML SOLN    Take 3 mLs by nebulization 4 (four) times daily.  Previous Medications   ALBUTEROL (PROVENTIL HFA;VENTOLIN HFA) 108 (90 BASE) MCG/ACT INHALER    Inhale 2 puffs into the lungs every 6 (six) hours as needed for wheezing or shortness of breath.   ALBUTEROL (PROVENTIL) (2.5 MG/3ML) 0.083% NEBULIZER SOLUTION    Take 3 mLs (2.5 mg total) by nebulization every 4 (four) hours as needed for wheezing or shortness of breath.   ALLOPURINOL (ZYLOPRIM) 100 MG TABLET    TAKE 1 TABLET (100 MG TOTAL) BY MOUTH DAILY.    APIXABAN (ELIQUIS) 5 MG TABS TABLET    Take 1 tablet (5 mg total) by mouth 2 (two) times daily.   BACLOFEN (LIORESAL) 10 MG TABLET    TAKE 1 TABLET EVERY 8 HOURS AS NEEDED FOR MUSCLE SPASM(S)   CARVEDILOL (COREG) 25 MG TABLET    Take 12.5 mg by mouth 2 (two) times daily with a meal.   DICLOFENAC (FLECTOR) 1.3 % PTCH    Place 1 patch onto the skin 2 (two) times daily as needed (pain).   DOCUSATE SODIUM 100 MG CAPS    Take 100 mg by mouth 2 (two) times daily.   GLIPIZIDE (GLUCOTROL) 5 MG TABLET    Take 5 mg by mouth 2 (two) times daily before a meal.   HYDRALAZINE (APRESOLINE) 50 MG TABLET    Take 1 tablet (50 mg total) by mouth 3 (three) times daily.   ISOSORBIDE MONONITRATE (IMDUR) 60 MG 24 HR TABLET    Take 60 mg by mouth daily.   METOLAZONE (ZAROXOLYN) 2.5 MG TABLET    Take 1 tablet (2.5 mg total) by mouth 2 (two) times a week. Tuesday and Friday   MULTIPLE VITAMINS-MINERALS (MULTIVITAMIN WITH MINERALS) TABLET    Take 1 tablet by mouth daily.   OMEGA-3 ACID ETHYL ESTERS (LOVAZA) 1 G CAPSULE    Take 1 g by mouth daily.   OXYCODONE-ACETAMINOPHEN (PERCOCET) 10-325 MG TABLET    Take 1 tablet by mouth every 8 (eight) hours as needed for pain. Use with stool softerner.   POLYETHYLENE GLYCOL POWDER (GLYCOLAX/MIRALAX) POWDER  MIX 1 CAPFUL (17GM)  IN  LIQUID  AND  DRINK EVERY DAY AS DIRECTED   POTASSIUM CHLORIDE SA (K-DUR,KLOR-CON) 20 MEQ TABLET    Take 2 tabs in AM and 1 tab in PM   TIOTROPIUM (SPIRIVA HANDIHALER) 18 MCG INHALATION CAPSULE    Place 1 capsule (18 mcg total) into inhaler and inhale daily.   TORSEMIDE (DEMADEX) 20 MG TABLET    TAKE 4 TABLETS (80MG )  TWICE DAILY (CHANGE IN DOSE)  Modified Medications   No medications on file  Discontinued Medications   No medications on file

## 2015-12-23 NOTE — Patient Outreach (Addendum)
[facility]) Care Management  12/22/2015  ETAI LISENBY Apr 19, 1951 [id number]   Transition of care (week 2)  RN was able to speak with pt today and reintroduce the [facility] services and purpose of today's call. Pt reports the reason for he recent admission related to fluid retention (HF).  Several contacts made to this pt over the last several months related to hospital admits. RN once again verifed pt's understanding of HF zones and verified pt remains in the GREEN zone today with no distressful symptoms. Reiterated on the HF zones and stress encouraged pt on closer monitoring and to avoid risk of fluid retention based upon his daily habits. Pt reports today's weight at 219 lbs and yesterday 218 lbs with no issues mentioned. RN able to completed transition of care and verify pt's upcoming scheduled provider visits. RN extended home visits to engage further concerning pt's medical issues however pt opt to decline at this time. Pt receptive to ongoing transition of care calls over the next few weeks. A plan of care was discussed along with goals that pt is receptive to meet. Pt also mentioned para-medicine with be involved with week visits. All follow up appointments mentioned with Dr. Kriste Basque and Dr. Patsy Lager. Other resources mentioned to pt for community services such as [employer] as pt is using this services, . RN also discussed [employer]'s transportation services. No other issues or request to address at this time. Will follow up telephonically with ongoing transition of care calls. [name], RN Care Management Coordinator [facility] Main Office [phone]

## 2015-12-24 ENCOUNTER — Telehealth: Payer: Self-pay

## 2015-12-24 DIAGNOSIS — G894 Chronic pain syndrome: Secondary | ICD-10-CM

## 2015-12-24 NOTE — Telephone Encounter (Signed)
Patient's callback # listed on the first message was incorrect.  The correct number is [phone].

## 2015-12-24 NOTE — Telephone Encounter (Signed)
Patient called to request a refill of oxyCODONE-acetaminophen (PERCOCET) 10-325 MG tablet from Dr Patsy Lager.  He said he is still waiting to hear back from pain management about scheduling an appointment, so he is asking for a refill until then.  The referral was originally sent to [facility], but it was denied.  It was then sent to [facility] on 11/06/15, and it is still under review.  CB#: [phone]

## 2015-12-26 MED ORDER — OXYCODONE-ACETAMINOPHEN 10-325 MG PO TABS: 1.0000 | ORAL_TABLET | Freq: Three times a day (TID) | ORAL | Status: DC | PRN

## 2015-12-26 NOTE — Telephone Encounter (Signed)
Ok, done. Called him and [facility]

## 2015-12-29 ENCOUNTER — Other Ambulatory Visit: Payer: Self-pay | Admitting: *Deleted

## 2015-12-29 NOTE — Patient Outreach (Signed)
[facility]) Care Management  12/29/2015  ABOUBAKAR EBBS [birth year] [id number]  Transition of care (week 3)  RN attempted outreach call today however unsuccessful. RN able to leave a HIPAA approved voice message will continue outreach attempts to inquire further.   [name], RN Care Management Coordinator [facility] Main Office [phone]

## 2016-01-01 ENCOUNTER — Other Ambulatory Visit: Payer: Self-pay | Admitting: *Deleted

## 2016-01-01 NOTE — Patient Outreach (Signed)
[facility]) Care Management  01/01/2016  Frank Davidson 06/20/1951 [id number]   Transition of care (week 3/2nd attempt)  RN attempted to contact pt today however only able to leave a HIPAA approved voice message. Will continue outreach attempts accordingly.  [name], RN Care Management Coordinator [facility] Main Office [phone]

## 2016-01-05 ENCOUNTER — Telehealth (HOSPITAL_COMMUNITY): Payer: Self-pay

## 2016-01-05 NOTE — Telephone Encounter (Signed)
Patient called to report 4 lb weight gain overnight. Currently at nephrologist discussing HD. Nephrologist ordering lab work including BMET/CMET. Will wait to hear BUN and CR to further advise. Not SOB, no noticeable swelling noted.  Ave Filter

## 2016-01-08 ENCOUNTER — Encounter: Payer: Self-pay | Admitting: Licensed Clinical Social Worker

## 2016-01-08 ENCOUNTER — Telehealth: Payer: Self-pay

## 2016-01-08 NOTE — Progress Notes (Signed)
CSW met with patient in the clinic. Patient reports he is in need of a scale as his scale at home is not weighing correctly. CSW obtained scale. Patient also shared he is in need of housing options. Patient states he has a good income although has multiple bills and can only afford max of $600 monthly. CSW assisted with housing search and provided patient with list of available apartments in the [place] area. Patient verbalizes follow up and will reach to CSW if further assistance needed. Raquel Sarna, [place]

## 2016-01-08 NOTE — Telephone Encounter (Signed)
PT called req. Refills for  2 meds.. [facility]   Original Order:  potassium chloride SA (K-DUR,KLOR-CON) 20 MEQ tablet [[id number]]         Ph[facility]:  [id number] - [place], [place] - [address]      Pharmacy Comments:  --         Quantity Remaining:  450 tablet Quantity Filled:  0 tablet              Original Order:  baclofen (LIORESAL) 10 MG tablet [[id number]]        Pharmacy:  [facility] - [place], [place] - [address]         [phone]

## 2016-01-12 ENCOUNTER — Encounter: Payer: Self-pay | Admitting: *Deleted

## 2016-01-12 ENCOUNTER — Other Ambulatory Visit: Payer: Self-pay

## 2016-01-12 ENCOUNTER — Other Ambulatory Visit: Payer: Self-pay | Admitting: *Deleted

## 2016-01-12 DIAGNOSIS — G894 Chronic pain syndrome: Secondary | ICD-10-CM

## 2016-01-12 DIAGNOSIS — I5021 Acute systolic (congestive) heart failure: Secondary | ICD-10-CM

## 2016-01-12 MED ORDER — BACLOFEN 10 MG PO TABS: ORAL_TABLET | ORAL | Status: AC

## 2016-01-12 NOTE — Telephone Encounter (Signed)
Pt reqs RF of oxycodone. He stated that Dr Patsy Lager had changed him to get RFs every 2 weeks instead of once a month. Pended RF. I advised pt that he will need to come in to est care with another provider to manage this med.

## 2016-01-12 NOTE — Telephone Encounter (Signed)
Clarified w/pt that he needs his baclofen sent to [facility] [address] (done) and advised that he should already have RFs on file for his K+.

## 2016-01-12 NOTE — Patient Outreach (Signed)
[facility]) Care Management  01/12/2016  Frank Davidson 07-Mar-1951 [id number]  Transition of care (week 1)  RN spoke with pt in detail concerning his HF as pt indicates today he is having a fair day due to fluid retention. States he has contact his doctors (HF clinic) and has been informed to take extra fluid medication. Pt states his doctor is aware of his symptoms as mentioned today with some "slight labored breathing" and increased weight from one week ago  at 219 lbs to yesterday 228 lbs and today at 235 lbs. Pt will follow up with her providers if no improvement. Pt is aware when to seek medical attention if he becomes distressed with his HF symptoms. RN offered once again as quested on each successful telephonic call for further involvement with a home visit however pt continues to decline this offer. RN further extended services with a health coach as pt receptive. Will refer accordingly and request a follow up soon in addition to March with the plan of care and goals discussed as pt will continue to manage his care accordingly. Pt has also requested a [facility] calendar to be mail as he keeps up with her daily weight in the calendar in organizing his care. RN will request this education information to be mailed in accordance to policy with the education letter.  RN will closed from community case management stand point.   [name], RN Care Management Coordinator [facility] Main Office [phone]

## 2016-01-13 ENCOUNTER — Inpatient Hospital Stay (HOSPITAL_COMMUNITY)
Admission: EM | Admit: 2016-01-13 | Discharge: 2016-01-15 | DRG: 291 | Disposition: A | Discharge status: Home Health | Payer: Commercial Managed Care - HMO | Attending: Internal Medicine | Admitting: Internal Medicine

## 2016-01-13 ENCOUNTER — Emergency Department (HOSPITAL_COMMUNITY): Payer: Commercial Managed Care - HMO

## 2016-01-13 ENCOUNTER — Telehealth (HOSPITAL_COMMUNITY): Payer: Self-pay | Admitting: *Deleted

## 2016-01-13 ENCOUNTER — Encounter (HOSPITAL_COMMUNITY): Payer: Self-pay | Admitting: Emergency Medicine

## 2016-01-13 DIAGNOSIS — E1151 Type 2 diabetes mellitus with diabetic peripheral angiopathy without gangrene: Secondary | ICD-10-CM | POA: Diagnosis present

## 2016-01-13 DIAGNOSIS — Z6831 Body mass index (BMI) 31.0-31.9, adult: Secondary | ICD-10-CM | POA: Diagnosis not present

## 2016-01-13 DIAGNOSIS — I480 Paroxysmal atrial fibrillation: Secondary | ICD-10-CM | POA: Diagnosis present

## 2016-01-13 DIAGNOSIS — N184 Chronic kidney disease, stage 4 (severe): Secondary | ICD-10-CM | POA: Diagnosis present

## 2016-01-13 DIAGNOSIS — Z7901 Long term (current) use of anticoagulants: Secondary | ICD-10-CM | POA: Diagnosis not present

## 2016-01-13 DIAGNOSIS — R7989 Other specified abnormal findings of blood chemistry: Secondary | ICD-10-CM

## 2016-01-13 DIAGNOSIS — T501X6A Underdosing of loop [high-ceiling] diuretics, initial encounter: Secondary | ICD-10-CM | POA: Diagnosis present

## 2016-01-13 DIAGNOSIS — K219 Gastro-esophageal reflux disease without esophagitis: Secondary | ICD-10-CM | POA: Diagnosis present

## 2016-01-13 DIAGNOSIS — J45909 Unspecified asthma, uncomplicated: Secondary | ICD-10-CM | POA: Diagnosis present

## 2016-01-13 DIAGNOSIS — I509 Heart failure, unspecified: Secondary | ICD-10-CM

## 2016-01-13 DIAGNOSIS — Z8673 Personal history of transient ischemic attack (TIA), and cerebral infarction without residual deficits: Secondary | ICD-10-CM | POA: Diagnosis not present

## 2016-01-13 DIAGNOSIS — M109 Gout, unspecified: Secondary | ICD-10-CM | POA: Diagnosis present

## 2016-01-13 DIAGNOSIS — J449 Chronic obstructive pulmonary disease, unspecified: Secondary | ICD-10-CM | POA: Diagnosis present

## 2016-01-13 DIAGNOSIS — I130 Hypertensive heart and chronic kidney disease with heart failure and stage 1 through stage 4 chronic kidney disease, or unspecified chronic kidney disease: Principal | ICD-10-CM | POA: Diagnosis present

## 2016-01-13 DIAGNOSIS — G4733 Obstructive sleep apnea (adult) (pediatric): Secondary | ICD-10-CM | POA: Diagnosis present

## 2016-01-13 DIAGNOSIS — Z87891 Personal history of nicotine dependence: Secondary | ICD-10-CM | POA: Diagnosis not present

## 2016-01-13 DIAGNOSIS — E669 Obesity, unspecified: Secondary | ICD-10-CM | POA: Diagnosis present

## 2016-01-13 DIAGNOSIS — T502X5S Adverse effect of carbonic-anhydrase inhibitors, benzothiadiazides and other diuretics, sequela: Secondary | ICD-10-CM

## 2016-01-13 DIAGNOSIS — I428 Other cardiomyopathies: Secondary | ICD-10-CM

## 2016-01-13 DIAGNOSIS — E1122 Type 2 diabetes mellitus with diabetic chronic kidney disease: Secondary | ICD-10-CM | POA: Diagnosis present

## 2016-01-13 DIAGNOSIS — Z91138 Patient's unintentional underdosing of medication regimen for other reason: Secondary | ICD-10-CM

## 2016-01-13 DIAGNOSIS — Z7984 Long term (current) use of oral hypoglycemic drugs: Secondary | ICD-10-CM

## 2016-01-13 DIAGNOSIS — R778 Other specified abnormalities of plasma proteins: Secondary | ICD-10-CM

## 2016-01-13 DIAGNOSIS — I429 Cardiomyopathy, unspecified: Secondary | ICD-10-CM | POA: Diagnosis present

## 2016-01-13 DIAGNOSIS — I10 Essential (primary) hypertension: Secondary | ICD-10-CM | POA: Diagnosis present

## 2016-01-13 DIAGNOSIS — I5023 Acute on chronic systolic (congestive) heart failure: Secondary | ICD-10-CM | POA: Diagnosis present

## 2016-01-13 DIAGNOSIS — R0600 Dyspnea, unspecified: Secondary | ICD-10-CM | POA: Diagnosis present

## 2016-01-13 LAB — CBC WITH DIFFERENTIAL/PLATELET
Basophils Absolute: 0 K/uL (ref 0.0–0.1)
Basophils Relative: 0 %
Eosinophils Absolute: 0.2 K/uL (ref 0.0–0.7)
Eosinophils Relative: 3 %
HCT: 38 % — ABNORMAL LOW (ref 39.0–52.0)
Hemoglobin: 12 g/dL — ABNORMAL LOW (ref 13.0–17.0)
Lymphocytes Relative: 16 %
Lymphs Abs: 1.2 K/uL (ref 0.7–4.0)
MCH: 28 pg (ref 26.0–34.0)
MCHC: 31.6 g/dL (ref 30.0–36.0)
MCV: 88.6 fL (ref 78.0–100.0)
Monocytes Absolute: 0.5 K/uL (ref 0.1–1.0)
Monocytes Relative: 7 %
Neutro Abs: 5.4 K/uL (ref 1.7–7.7)
Neutrophils Relative %: 74 %
Platelets: 210 K/uL (ref 150–400)
RBC: 4.29 MIL/uL (ref 4.22–5.81)
RDW: 17.7 % — ABNORMAL HIGH (ref 11.5–15.5)
WBC: 7.4 K/uL (ref 4.0–10.5)

## 2016-01-13 LAB — BASIC METABOLIC PANEL WITH GFR
Anion gap: 16 — ABNORMAL HIGH (ref 5–15)
BUN: 66 mg/dL — ABNORMAL HIGH (ref 6–20)
CO2: 34 mmol/L — ABNORMAL HIGH (ref 22–32)
Calcium: 10.4 mg/dL — ABNORMAL HIGH (ref 8.9–10.3)
Chloride: 91 mmol/L — ABNORMAL LOW (ref 101–111)
Creatinine, Ser: 2.3 mg/dL — ABNORMAL HIGH (ref 0.61–1.24)
GFR calc Af Amer: 33 mL/min — ABNORMAL LOW
GFR calc non Af Amer: 28 mL/min — ABNORMAL LOW
Glucose, Bld: 107 mg/dL — ABNORMAL HIGH (ref 65–99)
Potassium: 3.1 mmol/L — ABNORMAL LOW (ref 3.5–5.1)
Sodium: 141 mmol/L (ref 135–145)

## 2016-01-13 LAB — I-STAT TROPONIN, ED: Troponin i, poc: 0.1 ng/mL (ref 0.00–0.08)

## 2016-01-13 LAB — BRAIN NATRIURETIC PEPTIDE: B Natriuretic Peptide: 843.5 pg/mL — ABNORMAL HIGH (ref 0.0–100.0)

## 2016-01-13 MED ORDER — HYDROCODONE-ACETAMINOPHEN 5-325 MG PO TABS
2.0000 | ORAL_TABLET | Freq: Three times a day (TID) | ORAL | Status: DC
  Administered 2016-01-13 – 2016-01-15 (×5): 2 via ORAL
  Filled 2016-01-13 (×5): qty 2

## 2016-01-13 MED ORDER — POTASSIUM CHLORIDE CRYS ER 20 MEQ PO TBCR
20.0000 meq | EXTENDED_RELEASE_TABLET | Freq: Once | ORAL | Status: AC
  Administered 2016-01-13: 20 meq via ORAL
  Filled 2016-01-13: qty 1

## 2016-01-13 MED ORDER — FUROSEMIDE 10 MG/ML IJ SOLN
80.0000 mg | Freq: Once | INTRAMUSCULAR | Status: AC
  Administered 2016-01-13: 80 mg via INTRAVENOUS
  Filled 2016-01-13: qty 8

## 2016-01-13 MED ORDER — POTASSIUM CHLORIDE CRYS ER 20 MEQ PO TBCR
40.0000 meq | EXTENDED_RELEASE_TABLET | Freq: Once | ORAL | Status: AC
  Administered 2016-01-13: 40 meq via ORAL
  Filled 2016-01-13: qty 2

## 2016-01-13 MED ORDER — METOLAZONE 5 MG PO TABS
2.5000 mg | ORAL_TABLET | Freq: Every day | ORAL | Status: DC
  Administered 2016-01-13 – 2016-01-15 (×3): 2.5 mg via ORAL
  Filled 2016-01-13 (×5): qty 1

## 2016-01-13 MED ORDER — BACLOFEN 10 MG PO TABS
10.0000 mg | ORAL_TABLET | Freq: Three times a day (TID) | ORAL | Status: DC
  Administered 2016-01-14 – 2016-01-15 (×5): 10 mg via ORAL
  Filled 2016-01-13 (×6): qty 1

## 2016-01-13 MED ORDER — POTASSIUM CHLORIDE CRYS ER 20 MEQ PO TBCR: 60.0000 meq | EXTENDED_RELEASE_TABLET | Freq: Once | ORAL | Status: DC

## 2016-01-13 MED ORDER — OXYCODONE-ACETAMINOPHEN 10-325 MG PO TABS: 1.0000 | ORAL_TABLET | Freq: Three times a day (TID) | ORAL | Status: DC | PRN

## 2016-01-13 NOTE — ED Notes (Signed)
Pt is seen by community health paramedic every 2 days and today was seen and was noted to have shortness of breath worse on exertion and 10 + weight gain in the last 2 days.

## 2016-01-13 NOTE — ED Provider Notes (Signed)
CSN: [id number]     Arrival date & time 01/13/16  1511 History   First MD Initiated Contact with Patient 01/13/16 1516     Chief Complaint  Patient presents with  . Shortness of Breath     (Consider location/radiation/quality/duration/timing/severity/associated sxs/prior Treatment) HPI   Frank Davidson is a 65 y.o M with a pmhx of HTN, A. fib, COPD, CHF due to nonischemic cardiomyopathy with an EF 15%, CK D, DM presents to the emergency department today for increased shortness of breath and weight gain. Patient was advised to come to the emergency department by his community health provider that he sees once per week for his increased weight gain. Patient states that last week he weighed 219 pounds and today he weighed 231 pounds. Upon arrival to ED, patient weighs 239.2 pounds. Patient states he tried his home albuterol inhaler today without relief of his shortness breath. Patient states he is supposed to take 80 mg torsemide twice a day but has forgot to take in his medication over the last 2 days. Patient also takes metolazone twice a week and additional doses as needed. Denies syncope, chest pain, dizziness, increased lower extremity swelling, abdominal pain.  Past Medical History  Diagnosis Date  . Atrial fibrillation (HCC)     PAF  . Hypertension   . COPD (chronic obstructive pulmonary disease) (HCC)   . Ocular herpes   . Gout   . CHF (congestive heart failure) (HCC)   . Chronic pain   . Hyperlipidemia   . Nonischemic cardiomyopathy (HCC) Aug 2015    EF 15%  . Ocular herpes   . Neuropathy (HCC)   . Left-sided weakness     "because of the arthritis and edema"  . PVD (peripheral vascular disease) [facility]) May 2015    abnormal ABIs  . Memory impairment     "short term"  . CKD (chronic kidney disease), stage III     Dr. Briant Cedar follows-holding   . Fibromyalgia     neuropathy- hands, more than feet.  . Sleep apnea with use of continuous positive airway pressure (CPAP)      does not use cpap  . GERD (gastroesophageal reflux disease)     tums as needed  . Asthma   . Type II diabetes mellitus (HCC)   . Stroke [facility]) X 5    last stroke aug 2014"; denies residual on 09/12/2014  . Osteoarthritis   . Arthritis     "knees, right elbow, shoulder" (09/12/2014)  . Shortness of breath dyspnea    Past Surgical History  Procedure Laterality Date  . 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 Tonsillectomy  age 33  . Esophagogastroduodenoscopy N/A 12/24/2013    Procedure: ESOPHAGOGASTRODUODENOSCOPY (EGD);  Surgeon: Louis Meckel, MD;  Location: [facility];  Service: Endoscopy;  Laterality: N/A;  . Colonoscopy N/A 12/24/2013    Procedure: COLONOSCOPY;  Surgeon: Louis Meckel, MD;  Location: [facility] ENDOSCOPY;  Service: Endoscopy;  Laterality: N/A;  . Joint replacement    . Cardiac catheterization  10/2006    normal coronary arteries;   Marland Kitchen Cataract extraction w/ intraocular lens  implant, bilateral Bilateral ~ 2008  . Total hip arthroplasty Left 09/12/2014    Procedure: LEFT TOTAL HIP ARTHROPLASTY ANTERIOR APPROACH;  Surgeon: [name], MD;  Location: [facility] OR;  Service: Orthopedics;  Laterality: Left;  . Right heart catheterization N/A 08/13/2014    Rt ht cath prior to hip surgery   Family History  Problem  Relation Age of Onset  . Cancer Mother   . Hypertension Mother   . Lung disease Mother   . Diabetes Father   . Diabetes Sister   . Hypertension Sister   . Hypertension Brother   . Hypertension Sister   . Colon cancer Neg Hx    Social History  Substance Use Topics  . Smoking status: Former Smoker -- 0.50 packs/day for 13 years    Types: Cigarettes    Quit date: 12/18/2006  . Smokeless tobacco: Never Used  . Alcohol Use: No     Comment: Quit 2008    Review of Systems  All other systems reviewed and are negative.     Allergies  Corlanor; Melina Schools; Lunesta; and Actos  Home Medications   Prior to Admission medications   Medication Sig Start  Date End Date Taking? Authorizing Provider  albuterol (PROVENTIL HFA;VENTOLIN HFA) 108 (90 BASE) MCG/ACT inhaler Inhale 2 puffs into the lungs every 6 (six) hours as needed for wheezing or shortness of breath. 06/30/15   Gwenlyn Found Copland, MD  albuterol (PROVENTIL) (2.5 MG/3ML) 0.083% nebulizer solution Take 3 mLs (2.5 mg total) by nebulization every 4 (four) hours as needed for wheezing or shortness of breath. 11/26/15   Carmelina Dane, MD  allopurinol (ZYLOPRIM) 100 MG tablet TAKE 1 TABLET (100 MG TOTAL) BY MOUTH DAILY. 10/30/15   Pearline Cables, MD  apixaban (ELIQUIS) 5 MG TABS tablet Take 1 tablet (5 mg total) by mouth 2 (two) times daily. 02/04/15   Dolores Patty, MD  baclofen (LIORESAL) 10 MG tablet TAKE 1 TABLET EVERY 8 HOURS AS NEEDED FOR MUSCLE SPASM(S) 01/12/16   Wallis Bamberg, PA-C  carvedilol (COREG) 25 MG tablet Take 12.5 mg by mouth 2 (two) times daily with a meal.    Historical Provider, MD  diclofenac (FLECTOR) 1.3 % PTCH Place 1 patch onto the skin 2 (two) times daily as needed (pain). 11/11/15   Gwenlyn Found Copland, MD  docusate sodium 100 MG CAPS Take 100 mg by mouth 2 (two) times daily. 09/16/14   Lanney Gins, PA-C  glipiZIDE (GLUCOTROL) 5 MG tablet Take 5 mg by mouth 2 (two) times daily before a meal.    Historical Provider, MD  hydrALAZINE (APRESOLINE) 50 MG tablet Take 1 tablet (50 mg total) by mouth 3 (three) times daily. 09/15/15   Laurey Morale, MD  ipratropium-albuterol (DUONEB) 0.5-2.5 (3) MG/3ML SOLN Take 3 mLs by nebulization 4 (four) times daily. 12/23/15   Michele Mcalpine, MD  isosorbide mononitrate (IMDUR) 60 MG 24 hr tablet Take 60 mg by mouth daily.    Historical Provider, MD  metolazone (ZAROXOLYN) 2.5 MG tablet Take 1 tablet (2.5 mg total) by mouth 2 (two) times a week. Tuesday and Friday 09/29/15   Dolores Patty, MD  Multiple Vitamins-Minerals (MULTIVITAMIN WITH MINERALS) tablet Take 1 tablet by mouth daily.    Historical Provider, MD  omega-3 acid ethyl  esters (LOVAZA) 1 G capsule Take 1 g by mouth daily.    Historical Provider, MD  oxyCODONE-acetaminophen (PERCOCET) 10-325 MG tablet Take 1 tablet by mouth every 8 (eight) hours as needed for pain. Use with stool softerner. 01/13/16   Morrell Riddle, PA-C  polyethylene glycol powder (GLYCOLAX/MIRALAX) powder MIX 1 CAPFUL (17GM)  IN  LIQUID  AND  DRINK EVERY DAY AS DIRECTED 07/31/15   Gwenlyn Found Copland, MD  potassium chloride SA (K-DUR,KLOR-CON) 20 MEQ tablet Take 2 tabs in AM and 1 tab in PM  12/02/15   Gwenlyn Found Copland, MD  tiotropium (SPIRIVA HANDIHALER) 18 MCG inhalation capsule Place 1 capsule (18 mcg total) into inhaler and inhale daily. 09/29/15   Michele Mcalpine, MD  torsemide (DEMADEX) 20 MG tablet TAKE 4 TABLETS (80MG )  TWICE DAILY (CHANGE IN DOSE) 12/13/15   Richarda Overlie, MD   Wt 108.5 kg  SpO2 97% Physical Exam  Constitutional: He is oriented to person, place, and time. He appears well-developed and well-nourished. No distress.  HENT:  Head: Normocephalic and atraumatic.  Mouth/Throat: No oropharyngeal exudate.  Eyes: Conjunctivae and EOM are normal. Pupils are equal, round, and reactive to light. Right eye exhibits no discharge. Left eye exhibits no discharge. No scleral icterus.  exopthalmos  Neck: Normal range of motion. Neck supple.  Cardiovascular: Normal rate, regular rhythm, normal heart sounds and intact distal pulses.  Exam reveals no gallop and no friction rub.   No murmur heard. Pulmonary/Chest: Effort normal. No respiratory distress. He has no wheezes. He has no rales. He exhibits no tenderness.  Diminished breath sounds throughout  Abdominal: Soft. He exhibits no distension. There is no tenderness. There is no guarding.  Anasarca present  Musculoskeletal: Normal range of motion. He exhibits edema ( 2+ bilateral pitting edema up to groin).  Lymphadenopathy:    He has no cervical adenopathy.  Neurological: He is alert and oriented to person, place, and time.  Strength 5/5  throughout. No sensory deficits.    Skin: Skin is warm and dry. No rash noted. He is not diaphoretic. No erythema. No pallor.  Psychiatric: He has a normal mood and affect. His behavior is normal.  Nursing note and vitals reviewed.   ED Course  Procedures (including critical care time) Labs Review Labs Reviewed  BASIC METABOLIC PANEL - Abnormal; Notable for the following:    Potassium 3.1 (*)    Chloride 91 (*)    CO2 34 (*)    Glucose, Bld 107 (*)    BUN 66 (*)    Creatinine, Ser 2.30 (*)    Calcium 10.4 (*)    GFR calc non Af Amer 28 (*)    GFR calc Af Amer 33 (*)    Anion gap 16 (*)    All other components within normal limits  CBC WITH DIFFERENTIAL/PLATELET - Abnormal; Notable for the following:    Hemoglobin 12.0 (*)    HCT 38.0 (*)    RDW 17.7 (*)    All other components within normal limits  BRAIN NATRIURETIC PEPTIDE - Abnormal; Notable for the following:    B Natriuretic Peptide 843.5 (*)    All other components within normal limits  I-STAT TROPOININ, ED - Abnormal; Notable for the following:    Troponin i, poc 0.10 (*)    All other components within normal limits    Imaging Review Dg Chest 2 View  01/13/2016  CLINICAL DATA:  Shortness of breath worse with exertion and 10 pound weight gain in past 2 days, history CHF, COPD, hypertension, atrial fibrillation, asthma, type II diabetes mellitus EXAM: CHEST  2 VIEW COMPARISON:  12/11/2015 FINDINGS: Enlargement of cardiac silhouette with pulmonary vascular congestion. Linear scarring versus chronic subsegmental atelectasis in the mid lungs bilaterally, stable. No acute pulmonary edema or segmental consolidation. No pleural effusion or pneumothorax. Scattered degenerative changes thoracic spine. IMPRESSION: Enlargement of cardiac silhouette with pulmonary vascular congestion. Persistent atelectasis versus scarring in mid lungs bilaterally without acute infiltrate. Electronically Signed   By: Ulyses Southward M.D.   On: 01/13/2016  16:43   I have personally reviewed and evaluated these images and lab results as part of my medical decision-making.   EKG Interpretation None      MDM   Final diagnoses:  Acute on chronic heart failure, unspecified heart failure type [facility])    65 y.o M with pmhx of CHF, CKD, COPD presents to ED for increased SOB and >10lb weight gain over the last days. Pt sent to ED by community health provider and is followed by heart failure clinic. Pt admits to not taking his home torsemide and metolazone as prescribed. Pt is likely having acute on chronic HF exacerbation. No respiratory distress noted. No hypoxia. Weight noted to be 239.2lbs.  Labs drawn. Pt given 80IV lasix and home metolazone dose.  BNP elevated at 843.5. K low at 3.1, repleted in ED. EKG unchanged from previous. Troponin elevated a 0.1, suspect this is due to long standing HF vs ACS.  BUN 66. Cr 2.3. Pt with CKD, this seems to be his baseline when compared to previous.  CXR reveals persistent atelectasis vs scarring in mid lungs bilaterally. No infiltrate. Pulmonary vascular congestion present.   Will consult cardiology to recommend admission for acute on chronic HF exacerbation. Pt will need continued diuresis.   Spoke with cardiology who will consult pt in ED and admit.   Case discussed with Dr. Juleen China who agrees with treatment plan.   Lester Kinsman Kell, PA-C 01/13/16 1817  Raeford Razor, MD 01/15/16 2248

## 2016-01-13 NOTE — Telephone Encounter (Signed)
I let pt's wife know about Rx being ready. She reported that [facility] RN had called here earlier bc pt was having SOB and low O2 sat. We had instr'd to call EMT and they have taken pt to the hospital.  When pt picks up Rx, please read him message below from Sarah and see if he wants [place] to start referral to pain management, and let me know if so. Thanks!

## 2016-01-13 NOTE — ED Notes (Signed)
Wheeled patient from bathroom, placed patient back on monitor, continuous pulse oximetry and blood pressure cuff

## 2016-01-13 NOTE — H&P (Signed)
Cardiologist: Frank  SARTAJ Davidson is an 65 y.o. male.    Chief Complaint: Shortness of breath, weight gain HPI:  Frank Davidson is a 65 y.o. male with a hx of obesity, PAF, HTN, COPD, HTN, gout, DM 2, CVA and CHF EF 25% due to NICM. Intolerant Entresto at the lowest dose due to syncope.   Has previously followed in the [facility] in [place]. Previous cath in 2007 showed NICM with normal cors. Was living alone in [place] and apparently had issues managing medications by himself and was getting confused with PCP and cardiologists medications and was not taking his meds correctly. He moved to [place] to be near his nieces. Issues in the past with ACE-I due to renal failure. Highest creatinine in our system is ~1.5 but was apparently higher previously - thought to be related to taking his medications incorrectly.  Was admitted in 06/2013 from [facility] with left sided weakness and was found to have a moderate to large right hemispheric infarct. Went back to [facility].   Admitted to [facility] 10/14 through 09/09/13 with ADHF. Diuresed 30 pounds with IV lasix and transitioned to lasix 80 mg po bid. Continued on 25 mg carvedilol bid, isordil 20 mg daily. Started on lisinopril 2.5 mg daily. Discharge weight 229 pounds. 09/29/13 CT abdomen and pelvis - no obstruction . Diverticulosis. No inflammatory changes.   Admitted 11/23/13 through 11/26/13 with volume overload and dyspnea. Had worsening cardiorenal syndrome. Nephrology consulted and managed with IV lasix and transitioned to torsemide 40 mg twice a day. Diuresed 25 pounds. Discharge weight 232 pounds. Hydralazine, Spiro, and Lisinopril stopped at discharge. He is also off digoxin.   Admitted 07/01/15 for AKI after presumed over diuresis. His Cr on admit was 4.6., though his baseline is 2. We followed him in the hospital and held diuresis for several days. Cr on discharge was 2.36. We decreased his spiro to 12.5 mg, and his diuretic  regimen was continued at 40 mg torsemide BID. Discharge weight was 229.  Admitted in 10/16 with acute on chronic systolic CHF and AKI with creatinine up to 7.1 => ATN. Suspected cardiorenal syndrome, but surprisingly co-ox was normal. CVVH was required but renal function gradually improved. D/c weight 212 pounds.  Admitted Jan 2017 with recurrent volume overload after eating too much soup. Diuresed down to 219.   Seen for follow-up 12/16/2015: "Feels much better. Trying to be better about his diet. Weighing every day. Weight 222 today. Takes torsemide 80 bid and metolazone 2x/week. Also takes extra metolazone as needed. Denies dyspnea. No bendopnea. Chronic 2 pillow orthopnea. No edema or dizziness. Saw Dr Mercy Moore end of November, kidneys stable but they still recommended getting a shunt placed in anticipation of dialysis he has deferred this"   [name] reports increasing weight over the last couple weeks. About a week and a half to 2 weeks ago he was 219 pounds weight is now climbed up to 231 which he was this morning. He's been taking the couple extra doses of metolazone last week but forgot to take it again Sunday.  He had some pizza last night and it sounds like he's probably got some dietary noncompliance and may also have missed some torsemide. He has lower extremity edema, orthopnea, shortness of breath, abdominal distention but otherwise denies nausea, vomiting, fever, chest pain,  dizziness, cough, congestion, abdominal pain, hematochezia, melena, claudication.    Past Medical History  Diagnosis Date  . Atrial fibrillation ([place])  PAF  . Hypertension   . COPD (chronic obstructive pulmonary disease) ([place])   . Ocular herpes   . Gout   . CHF (congestive heart failure) ([place])   . Chronic pain   . Hyperlipidemia   . Nonischemic cardiomyopathy ([place]) Aug 2015    EF 15%  . Ocular herpes   . Neuropathy ([place])   . Left-sided weakness     "because of the arthritis and edema"   . PVD (peripheral vascular disease) [facility]) May 2015    abnormal ABIs  . Memory impairment     "short term"  . CKD (chronic kidney disease), stage III     Dr. Mercy Moore follows-holding   . Fibromyalgia     neuropathy- hands, more than feet.  . Sleep apnea with use of continuous positive airway pressure (CPAP)     does not use cpap  . GERD (gastroesophageal reflux disease)     tums as needed  . Asthma   . Type II diabetes mellitus ([place])   . Stroke [facility]) X 5    last stroke aug 2014"; denies residual on 09/12/2014  . Osteoarthritis   . Arthritis     "knees, right elbow, shoulder" (09/12/2014)  . Shortness of breath dyspnea     Past Surgical History  Procedure Laterality Date  . 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 Tonsillectomy  age 53  . Esophagogastroduodenoscopy N/A 12/24/2013    Procedure: ESOPHAGOGASTRODUODENOSCOPY (EGD);  Surgeon: Inda Castle, MD;  Location: [facility];  Service: Endoscopy;  Laterality: N/A;  . Colonoscopy N/A 12/24/2013    Procedure: COLONOSCOPY;  Surgeon: Inda Castle, MD;  Location: [facility];  Service: Endoscopy;  Laterality: N/A;  . Joint replacement    . Cardiac catheterization  10/2006    normal coronary arteries;   Marland Kitchen Cataract extraction w/ intraocular lens  implant, bilateral Bilateral ~ 2008  . Total hip arthroplasty Left 09/12/2014    Procedure: LEFT TOTAL HIP ARTHROPLASTY ANTERIOR APPROACH;  Surgeon: Mauri Pole, MD;  Location: [facility];  Service: Orthopedics;  Laterality: Left;  . Right heart catheterization N/A 08/13/2014    Rt ht cath prior to hip surgery    Family History  Problem Relation Age of Onset  . Cancer Mother   . Hypertension Mother   . Lung disease Mother   . Diabetes Father   . Diabetes Sister   . Hypertension Sister   . Hypertension Brother   . Hypertension Sister   . Colon cancer Neg Hx    Social History:  reports that he quit smoking about 9 years ago. His smoking use included Cigarettes. He has a 6.5  pack-year smoking history. He has never used smokeless tobacco. He reports that he uses illicit drugs (Cocaine and Marijuana). He reports that he does not drink alcohol.  Allergies:  Allergies  Allergen Reactions  . Corlanor [Ivabradine] Palpitations and Other (See Comments)    Kidney and heart problems, syncope  . Entresto [Sacubitril-Valsartan]     Syncope  . Januvia [Sitagliptin] Other (See Comments)    "Almost killed me"   . Lunesta [Eszopiclone] Palpitations and Other (See Comments)    Kidney and heart problems, syncope  . Actos [Pioglitazone] Other (See Comments)    Caused blood in urine and fluid retention      (Not in a hospital admission)  Results for orders placed or performed during the hospital encounter of 01/13/16 (from the past 48 hour(s))  Brain natriuretic peptide  Status: Abnormal   Collection Time: 01/13/16  3:44 PM  Result Value Ref Range   B Natriuretic Peptide 843.5 (H) 0.0 - 100.0 pg/mL  Basic metabolic panel     Status: Abnormal   Collection Time: 01/13/16  3:47 PM  Result Value Ref Range   Sodium 141 135 - 145 mmol/L   Potassium 3.1 (L) 3.5 - 5.1 mmol/L   Chloride 91 (L) 101 - 111 mmol/L   CO2 34 (H) 22 - 32 mmol/L   Glucose, Bld 107 (H) 65 - 99 mg/dL   BUN 66 (H) 6 - 20 mg/dL   Creatinine, Ser 2.30 (H) 0.61 - 1.24 mg/dL   Calcium 10.4 (H) 8.9 - 10.3 mg/dL   GFR calc non Af Amer 28 (L) >60 mL/min   GFR calc Af Amer 33 (L) >60 mL/min    Comment: (NOTE) The eGFR has been calculated using the CKD EPI equation. This calculation has not been validated in all clinical situations. eGFR's persistently <60 mL/min signify possible Chronic Kidney Disease.    Anion gap 16 (H) 5 - 15  CBC with Differential     Status: Abnormal   Collection Time: 01/13/16  3:47 PM  Result Value Ref Range   WBC 7.4 4.0 - 10.5 K/uL   RBC 4.29 4.22 - 5.81 MIL/uL   Hemoglobin 12.0 (L) 13.0 - 17.0 g/dL   HCT 38.0 (L) 39.0 - 52.0 %   MCV 88.6 78.0 - 100.0 fL   MCH 28.0  26.0 - 34.0 pg   MCHC 31.6 30.0 - 36.0 g/dL   RDW 17.7 (H) 11.5 - 15.5 %   Platelets 210 150 - 400 K/uL   Neutrophils Relative % 74 %   Neutro Abs 5.4 1.7 - 7.7 K/uL   Lymphocytes Relative 16 %   Lymphs Abs 1.2 0.7 - 4.0 K/uL   Monocytes Relative 7 %   Monocytes Absolute 0.5 0.1 - 1.0 K/uL   Eosinophils Relative 3 %   Eosinophils Absolute 0.2 0.0 - 0.7 K/uL   Basophils Relative 0 %   Basophils Absolute 0.0 0.0 - 0.1 K/uL  I-stat troponin, ED     Status: Abnormal   Collection Time: 01/13/16  3:54 PM  Result Value Ref Range   Troponin i, poc 0.10 (HH) 0.00 - 0.08 ng/mL   Comment NOTIFIED PHYSICIAN    Comment 3            Comment: Due to the release kinetics of cTnI, a negative result within the first hours of the onset of symptoms does not rule out myocardial infarction with certainty. If myocardial infarction is still suspected, repeat the test at appropriate intervals.    Dg Chest 2 View  01/13/2016  CLINICAL DATA:  Shortness of breath worse with exertion and 10 pound weight gain in past 2 days, history CHF, COPD, hypertension, atrial fibrillation, asthma, type II diabetes mellitus EXAM: CHEST  2 VIEW COMPARISON:  12/11/2015 FINDINGS: Enlargement of cardiac silhouette with pulmonary vascular congestion. Linear scarring versus chronic subsegmental atelectasis in the mid lungs bilaterally, stable. No acute pulmonary edema or segmental consolidation. No pleural effusion or pneumothorax. Scattered degenerative changes thoracic spine. IMPRESSION: Enlargement of cardiac silhouette with pulmonary vascular congestion. Persistent atelectasis versus scarring in mid lungs bilaterally without acute infiltrate. Electronically Signed   By: Lavonia Dana M.D.   On: 01/13/2016 16:43    Review of Systems  Constitutional: Negative for fever, chills and diaphoresis.  HENT: Negative for congestion and sore throat.  Respiratory: Positive for shortness of breath. Negative for cough.   Cardiovascular:  Positive for orthopnea, leg swelling and PND. Negative for chest pain.  Gastrointestinal: Negative for nausea, vomiting, abdominal pain, blood in stool and melena.  Genitourinary: Negative for hematuria.  Musculoskeletal: Negative for myalgias.  Neurological: Negative for dizziness.  All other systems reviewed and are negative.   Blood pressure 115/86, pulse 87, resp. rate 19, weight 239 lb 3.2 oz (108.5 kg), SpO2 99 %. Physical Exam  Nursing note and vitals reviewed. Constitutional: He is oriented to person, place, and time. He appears well-developed. No distress.  Obese   HENT:  Head: Normocephalic and atraumatic.  Mouth/Throat: No oropharyngeal exudate.  Eyes: Conjunctivae and EOM are normal. Pupils are equal, round, and reactive to light. No scleral icterus.  Neck: Normal range of motion. Neck supple.  Cardiovascular: Normal rate and regular rhythm.   No murmur heard. Respiratory: Effort normal. He has no wheezes. He has no rales.  Decreased BS throughout.   GI: Soft. Bowel sounds are normal. He exhibits distension. There is no tenderness.  Musculoskeletal: He exhibits edema (1+ lower extremity edema).  Lymphadenopathy:    He has no cervical adenopathy.  Neurological: He is alert and oriented to person, place, and time. He exhibits normal muscle tone.  Skin: Skin is warm and dry.  Psychiatric: He has a normal mood and affect.     Assessment/Plan  Active Problems:   Hypertension   Non-ischemic cardiomyopathy- EF 15% echo Aug 2015   Chronic anticoagulation   Dyspnea   Acute on chronic systolic CHF (congestive heart failure) ([place])   Type 2 diabetes mellitus with stage 4 chronic kidney disease, without long-term current use of insulin (HCC)   CKD (chronic kidney disease), stage IV ([place])   Morbid obesity due to excess calories (HCC)   Elevated troponin  Mr. Mckesson be admitted for IV diuresis. Sounds like his typical presentation with extra volume.  Strict I's and O's  and daily weights.  We will reeducated on dietary and medication compliance. Monitor basic metabolic panel.  Continue home medications including albuterol, and requests, Coreg and hydralazine doing abs isosorbide mononitrate potassium Spiriva.  Will put on advanced heart failure team for rounding tomorrow.  Tarri Fuller, PA-C 01/13/2016, 6:46 PM   I have personally seen and examined this patient with Tarri Fuller, PA-C I agree with the assessment and plan as outlined above. I have personally examined this patient and confirm the physical exam findings above. He has chronic systolic CHF, NICM. He has been non-compliant with meds for two days. He is volume overloaded. Fortunately renal function is stable Will admit for diuresis with IV Lasix. CHF team to follow in am.   [facility] 01/13/2016  9:14 PM

## 2016-01-13 NOTE — Telephone Encounter (Signed)
I wrote him a 2 week supply - I think it would be best if we referred him to pain management - I know Dr Patsy Lager was filling his medication every 2 weeks but she is no longer here and this type of medication would best come from a specialist.  We can write for him while we are waiting on an appt - if he does not like this idea he can come to the walk-in clinic and see someone but I will not promise that they will be willing to write this for him long term.

## 2016-01-13 NOTE — Telephone Encounter (Signed)
Katie called to let us know pt's wt is up about 13 lbs, he was 219 lb on 2/9 and he is at 231 lb today.  She states since the 9th his wt has gradually been increasing, he has been taking meds including Tor 80 BID and met every Tue and Fri and she states wt was up on Sat after taking metolazone.  She states pt also took metolazone yesterday but wt did not decrease.  She states pt is very SOB with very minimal exertion and sats drop to low 80s, at rest they return to low 90s.  Pt does have edema and abd is tight/distended.  Advised with symptoms and amount of wt pt should go to ER, she will advise pt 

## 2016-01-14 ENCOUNTER — Encounter (HOSPITAL_COMMUNITY): Payer: Self-pay | Admitting: General Practice

## 2016-01-14 LAB — TROPONIN I
TROPONIN I: 0.08 ng/mL — AB (ref <0.031)
Troponin I: 0.09 ng/mL — ABNORMAL HIGH (ref <0.031)
Troponin I: 0.08 ng/mL — ABNORMAL HIGH (ref <0.031)

## 2016-01-14 LAB — CBG MONITORING, ED: Glucose-Capillary: 100 mg/dL — ABNORMAL HIGH (ref 65–99)

## 2016-01-14 LAB — GLUCOSE, CAPILLARY: Glucose-Capillary: 125 mg/dL — ABNORMAL HIGH (ref 65–99)

## 2016-01-14 MED ORDER — FUROSEMIDE 10 MG/ML IJ SOLN
80.0000 mg | Freq: Two times a day (BID) | INTRAMUSCULAR | Status: DC
  Administered 2016-01-14 – 2016-01-15 (×4): 80 mg via INTRAVENOUS
  Filled 2016-01-14 (×5): qty 8

## 2016-01-14 MED ORDER — ACETAMINOPHEN 325 MG PO TABS: 650.0000 mg | ORAL_TABLET | ORAL | Status: DC | PRN

## 2016-01-14 MED ORDER — SODIUM CHLORIDE 0.9% FLUSH
3.0000 mL | Freq: Two times a day (BID) | INTRAVENOUS | Status: DC
  Administered 2016-01-14 – 2016-01-15 (×3): 3 mL via INTRAVENOUS

## 2016-01-14 MED ORDER — IPRATROPIUM-ALBUTEROL 0.5-2.5 (3) MG/3ML IN SOLN
3.0000 mL | Freq: Four times a day (QID) | RESPIRATORY_TRACT | Status: DC
  Administered 2016-01-14 – 2016-01-15 (×6): 3 mL via RESPIRATORY_TRACT
  Filled 2016-01-14 (×6): qty 3

## 2016-01-14 MED ORDER — ONDANSETRON HCL 4 MG/2ML IJ SOLN: 4.0000 mg | Freq: Four times a day (QID) | INTRAMUSCULAR | Status: DC | PRN

## 2016-01-14 MED ORDER — SODIUM CHLORIDE 0.9% FLUSH: 3.0000 mL | INTRAVENOUS | Status: DC | PRN

## 2016-01-14 MED ORDER — APIXABAN 5 MG PO TABS
5.0000 mg | ORAL_TABLET | Freq: Two times a day (BID) | ORAL | Status: DC
  Administered 2016-01-14 – 2016-01-15 (×3): 5 mg via ORAL
  Filled 2016-01-14 (×3): qty 1

## 2016-01-14 MED ORDER — INSULIN ASPART 100 UNIT/ML ~~LOC~~ SOLN
0.0000 [IU] | Freq: Three times a day (TID) | SUBCUTANEOUS | Status: DC
  Administered 2016-01-14 – 2016-01-15 (×3): 2 [IU] via SUBCUTANEOUS

## 2016-01-14 MED ORDER — SODIUM CHLORIDE 0.9 % IV SOLN: 250.0000 mL | INTRAVENOUS | Status: DC | PRN

## 2016-01-14 MED ORDER — CARVEDILOL 12.5 MG PO TABS
12.5000 mg | ORAL_TABLET | Freq: Two times a day (BID) | ORAL | Status: DC
  Administered 2016-01-14 – 2016-01-15 (×3): 12.5 mg via ORAL
  Filled 2016-01-14 (×3): qty 1

## 2016-01-14 MED ORDER — HYDRALAZINE HCL 50 MG PO TABS
50.0000 mg | ORAL_TABLET | Freq: Three times a day (TID) | ORAL | Status: DC
  Administered 2016-01-14 – 2016-01-15 (×3): 50 mg via ORAL
  Filled 2016-01-14 (×3): qty 1
  Filled 2016-01-14: qty 2

## 2016-01-14 MED ORDER — POTASSIUM CHLORIDE CRYS ER 20 MEQ PO TBCR
20.0000 meq | EXTENDED_RELEASE_TABLET | Freq: Two times a day (BID) | ORAL | Status: DC
  Administered 2016-01-14 – 2016-01-15 (×3): 20 meq via ORAL
  Filled 2016-01-14 (×3): qty 1

## 2016-01-14 MED ORDER — ALLOPURINOL 100 MG PO TABS
100.0000 mg | ORAL_TABLET | Freq: Every day | ORAL | Status: DC
  Administered 2016-01-14 – 2016-01-15 (×2): 100 mg via ORAL
  Filled 2016-01-14 (×2): qty 1

## 2016-01-14 MED ORDER — ISOSORBIDE MONONITRATE ER 60 MG PO TB24
60.0000 mg | ORAL_TABLET | Freq: Every day | ORAL | Status: DC
  Administered 2016-01-14 – 2016-01-15 (×2): 60 mg via ORAL
  Filled 2016-01-14 (×2): qty 1

## 2016-01-14 MED ORDER — TIOTROPIUM BROMIDE MONOHYDRATE 18 MCG IN CAPS: 18.0000 ug | ORAL_CAPSULE | Freq: Every day | RESPIRATORY_TRACT | Status: DC

## 2016-01-14 MED ORDER — ALBUTEROL SULFATE (2.5 MG/3ML) 0.083% IN NEBU: 2.5000 mg | INHALATION_SOLUTION | RESPIRATORY_TRACT | Status: DC | PRN

## 2016-01-14 MED ORDER — DOCUSATE SODIUM 100 MG PO CAPS
100.0000 mg | ORAL_CAPSULE | Freq: Two times a day (BID) | ORAL | Status: DC
  Administered 2016-01-14 – 2016-01-15 (×3): 100 mg via ORAL
  Filled 2016-01-14 (×3): qty 1

## 2016-01-14 NOTE — ED Notes (Signed)
attmepted to call report  

## 2016-01-14 NOTE — Progress Notes (Signed)
UR Completed Benedicto Capozzi Graves-Bigelow, RN,BSN [phone]  

## 2016-01-14 NOTE — Consult Note (Signed)
   [facility] Inpatient Consult   01/14/2016  Frank Davidson Jun 15, 1951 [id number] Patient is currently active with [facility] for chronic disease management services and had just been referred for the [place].  Patient has been engaged by a [facility], Pharmacy for medication assistance.  Our community based plan of care has focused on disease management and community resource support.  Consent is active.  Met with patient he states he was doing well but he will gladly have the community nurse follow up when he returns home.  He states that his sister and niece takes good care of him.  He states he may need a referral for some transportation assistance.   Patient will receive a post discharge transition of care call and will be evaluated for monthly home visits for assessments and disease process education. Patient was encouraged to allow the nurse to do a home visit, if symptoms arises for closer monitoring.   Made Inpatient Case Manager aware that [facility] following. Of note, [facility] Care Management services does not replace or interfere with any services that are needed or arranged by inpatient case management or social work.  For additional questions or referrals please contact: Natividad Brood, RN BSN [facility] Liaison  [phone] business mobile phone Toll free office [phone]

## 2016-01-14 NOTE — ED Notes (Signed)
Breakfast Tray called @ 0614. 

## 2016-01-15 LAB — GLUCOSE, CAPILLARY
GLUCOSE-CAPILLARY: 107 mg/dL — AB (ref 65–99)
GLUCOSE-CAPILLARY: 116 mg/dL — AB (ref 65–99)
GLUCOSE-CAPILLARY: 135 mg/dL — AB (ref 65–99)
Glucose-Capillary: 135 mg/dL — ABNORMAL HIGH (ref 65–99)

## 2016-01-15 LAB — BASIC METABOLIC PANEL
Anion gap: 14 (ref 5–15)
BUN: 57 mg/dL — ABNORMAL HIGH (ref 6–20)
CALCIUM: 9.6 mg/dL (ref 8.9–10.3)
CHLORIDE: 93 mmol/L — AB (ref 101–111)
CO2: 31 mmol/L (ref 22–32)
CREATININE: 2.32 mg/dL — AB (ref 0.61–1.24)
GFR, EST AFRICAN AMERICAN: 32 mL/min — AB (ref >60)
GFR, EST NON AFRICAN AMERICAN: 28 mL/min — AB (ref >60)
Glucose, Bld: 127 mg/dL — ABNORMAL HIGH (ref 65–99)
Potassium: 3 mmol/L — ABNORMAL LOW (ref 3.5–5.1)
SODIUM: 138 mmol/L (ref 135–145)

## 2016-01-15 MED ORDER — TORSEMIDE 20 MG PO TABS: 100.0000 mg | ORAL_TABLET | Freq: Two times a day (BID) | ORAL | Status: DC

## 2016-01-15 MED ORDER — TORSEMIDE 20 MG PO TABS
ORAL_TABLET | ORAL | Status: DC
Start: 2016-01-15 — End: 2016-02-12

## 2016-01-15 MED ORDER — POTASSIUM CHLORIDE CRYS ER 20 MEQ PO TBCR: 40.0000 meq | EXTENDED_RELEASE_TABLET | Freq: Two times a day (BID) | ORAL | Status: DC

## 2016-01-15 MED ORDER — IPRATROPIUM-ALBUTEROL 0.5-2.5 (3) MG/3ML IN SOLN: 3.0000 mL | Freq: Four times a day (QID) | RESPIRATORY_TRACT | Status: DC

## 2016-01-15 MED ORDER — POTASSIUM CHLORIDE CRYS ER 20 MEQ PO TBCR
40.0000 meq | EXTENDED_RELEASE_TABLET | Freq: Once | ORAL | Status: AC
Start: 2016-01-15 — End: 2016-01-15
  Administered 2016-01-15: 40 meq via ORAL
  Filled 2016-01-15: qty 2

## 2016-01-15 NOTE — Care Management Note (Signed)
Case Management Note  Patient Details  Name: Frank Davidson MRN: [id number] Date of Birth: 1950-12-28  Subjective/Objective:       Pt admitted for CHF. Pt is from home and is agreeable to [facility] RN Services.  Pt did not qualify for home 02 at time of discharge.             Action/Plan: Choice offered and pt chose [facility]. Referral was made and SOC to begin within 24-48 hours post d/c. No further needs from CM at this time.    Expected Discharge Date:                  Expected Discharge Plan:  Home w Home Health Services  In-House Referral:  NA  Discharge planning Services  CM Consult  Post Acute Care Choice:  Home Health Choice offered to:  Patient  DME Arranged:  N/A DME Agency:  NA  HH Arranged:  RN HH Agency:  [facility]  Status of Service:  Completed, signed off  Medicare Important Message Given:    Date Medicare IM Given:    Medicare IM give by:    Date Additional Medicare IM Given:    Additional Medicare Important Message give by:     If discussed at Long Length of Stay Meetings, dates discussed:    Additional Comments:  Gala Lewandowsky, RN 01/15/2016, 3:21 PM

## 2016-01-15 NOTE — Progress Notes (Signed)
Advanced Heart Failure Rounding Note   Subjective:    Admitted with volume overload. Diuresing with IV lasix. Creatinine unchanged 2.3. Diuresed  A few pounds.   Ongoing dyspnea on exertion. Denies SOB at rest.    Objective:   Weight Range:  Vital Signs:   Temp:  [97.6 F (36.4 C)-97.7 F (36.5 C)] 97.7 F (36.5 C) (02/23 0550) Pulse Rate:  [96-98] 96 (02/23 1151) Resp:  [16-17] 17 (02/23 1151) BP: (106-115)/(62-76) 106/76 mmHg (02/23 0550) SpO2:  [94 %-100 %] 99 % (02/23 1151) Weight:  [219 lb 8 oz (99.565 kg)] 219 lb 8 oz (99.565 kg) (02/23 0550) Last BM Date: 01/14/16  Weight change: Filed Weights   01/13/16 1530 01/14/16 1115 01/15/16 0550  Weight: 239 lb 3.2 oz (108.5 kg) 221 lb 8 oz (100.472 kg) 219 lb 8 oz (99.565 kg)    Intake/Output:   Intake/Output Summary (Last 24 hours) at 01/15/16 1311 Last data filed at 01/15/16 1145  Gross per 24 hour  Intake    840 ml  Output   2100 ml  Net  -1260 ml    PHYSICAL EXAM: General: Obese male; Dypsnea with exeriton. Sitting on the side of the bed.  HEENT: normal Neck: supple. Thick, JVP difficult to assess, but does not appear elevated. Carotids 2+ bilaterally; no bruits. No thyromegaly or nodule noted. Cor: PMI nonpalpable. RRR. No M/G/R appreciated Lungs: Clear, regular with normal respiratory effort Abdomen: obese. soft, nontender, + distention. No HSM. No bruits or masses. Bowel sounds x 4. Extremities: no cyanosis, clubbing, rash. no edema.  Neuro: alert & orientedx3, cranial nerves grossly intact. Affect pleasant.  Telemetry: NSR 90-100s   Labs: Basic Metabolic Panel:  Recent Labs Lab 01/13/16 1547 01/15/16 0500  NA 141 138  K 3.1* 3.0*  CL 91* 93*  CO2 34* 31  GLUCOSE 107* 127*  BUN 66* 57*  CREATININE 2.30* 2.32*  CALCIUM 10.4* 9.6    Liver Function Tests: No results for input(s): AST, ALT, ALKPHOS, BILITOT, PROT, ALBUMIN in the last 168 hours. No results for input(s): LIPASE, AMYLASE  in the last 168 hours. No results for input(s): AMMONIA in the last 168 hours.  CBC:  Recent Labs Lab 01/13/16 1547  WBC 7.4  NEUTROABS 5.4  HGB 12.0*  HCT 38.0*  MCV 88.6  PLT 210    Cardiac Enzymes:  Recent Labs Lab 01/14/16 0713 01/14/16 1233 01/14/16 1850  TROPONINI 0.09* 0.08* 0.08*    BNP: BNP (last 3 results)  Recent Labs  09/29/15 1415 12/11/15 2345 01/13/16 1544  BNP 550.7* 1024.0* 843.5*    ProBNP (last 3 results) No results for input(s): PROBNP in the last 8760 hours.    Other results:  Imaging: Dg Chest 2 View  01/13/2016  CLINICAL DATA:  Shortness of breath worse with exertion and 10 pound weight gain in past 2 days, history CHF, COPD, hypertension, atrial fibrillation, asthma, type II diabetes mellitus EXAM: CHEST  2 VIEW COMPARISON:  12/11/2015 FINDINGS: Enlargement of cardiac silhouette with pulmonary vascular congestion. Linear scarring versus chronic subsegmental atelectasis in the mid lungs bilaterally, stable. No acute pulmonary edema or segmental consolidation. No pleural effusion or pneumothorax. Scattered degenerative changes thoracic spine. IMPRESSION: Enlargement of cardiac silhouette with pulmonary vascular congestion. Persistent atelectasis versus scarring in mid lungs bilaterally without acute infiltrate. Electronically Signed   By: Ulyses Southward M.D.   On: 01/13/2016 16:43      Medications:     Scheduled Medications: . allopurinol  100  mg Oral Daily  . apixaban  5 mg Oral BID  . baclofen  10 mg Oral TID  . carvedilol  12.5 mg Oral BID WC  . docusate sodium  100 mg Oral BID  . furosemide  80 mg Intravenous Q12H  . hydrALAZINE  50 mg Oral TID  . HYDROcodone-acetaminophen  2 tablet Oral TID  . insulin aspart  0-15 Units Subcutaneous TID WC  . ipratropium-albuterol  3 mL Nebulization Q6H  . isosorbide mononitrate  60 mg Oral Daily  . metolazone  2.5 mg Oral Daily  . potassium chloride  20 mEq Oral BID  . sodium chloride flush   3 mL Intravenous Q12H     Infusions:     PRN Medications:  sodium chloride, acetaminophen, albuterol, ondansetron (ZOFRAN) IV, sodium chloride flush   Assessment:  1. A/C Systolic Heart Failure 2. CKD IV 3. PAF 3. Gout 5. COPD 6. OSA   Plan/Discussion:   Volume status improved. Stop IV lasix and switch to torsemide 100 mg twice a day with metolazone twice a week. Renal function ok. I have reinforced low salt diet as he has been eating high salt diet.   Plan for vein mapping next month in preparation for possible HD.   Needs to follow up for CPAP. He is reluctant to set up follow up.   Follow up in HF clinic next week.    Possible D/C today   Length of Stay: 2   Amy Clegg NP-C  01/15/2016, 1:11 PM  Advanced Heart Failure Team Pager [phone] (M-F; 7a - 4p)  Please contact [facility] for night-coverage after hours (4p -7a ) and weekends on [email or web address]  Patient seen and examined with Tonye Becket, NP. We discussed all aspects of the encounter. I agree with the assessment and plan as stated above.   Volume status much better. Can go home today. Increase torsemide to 100/80. Long talk about need for dietary discretion and use of sliding scale diuretics.   Derik Fults,MD 3:10 PM

## 2016-01-15 NOTE — Discharge Instructions (Signed)

## 2016-01-15 NOTE — Discharge Summary (Signed)
Advanced Heart Failure Team  Discharge Summary   Patient ID: Frank Davidson MRN: [id number], DOB/AGE: Apr 03, 1951 65 y.o. Admit date: 01/13/2016 D/C date:     01/15/2016   Primary Discharge Diagnoses:  1. A/C Systolic Heart Failure 2. CKD IV 3. PAF 3. Gout 5. COPD 6. OSA  Hospital Course:   Frank Davidson is a 65 y.o. male with a hx of obesity, PAF, HTN, COPD, HTN, gout, DM 2, CVA and CHF EF 25% due to NICM.   Admitted with volume overload in the setting of high salt diet. CXR with evidence of vascular congestion. He was diuresed with IV lasix and transitioned to torsemide. Overall he was diuresed 3 pounds. Narrow euvolemic window. Renal function was followed closely and remained stable. He was counseled extensively on low salt diet and to avoid high salt foods such as pizza and soup.   He remained in Sinus Rhythm and continued on eliquis. He will continue to be followed closely in the HF clinic. Plan to refer to paramedicine as he is at high risk for readmit.    Discharge Weight:  219 pounds.  Discharge Vitals: Blood pressure 105/77, pulse 60, temperature 98.6 F (37 C), temperature source Oral, resp. rate 18, height  (1.778 m), weight 219 lb 8 oz (99.565 kg), SpO2 98 %.  Labs: Lab Results  Component Value Date   WBC 7.4 01/13/2016   HGB 12.0* 01/13/2016   HCT 38.0* 01/13/2016   MCV 88.6 01/13/2016   PLT 210 01/13/2016     Recent Labs Lab 01/15/16 0500  NA 138  K 3.0*  CL 93*  CO2 31  BUN 57*  CREATININE 2.32*  CALCIUM 9.6  GLUCOSE 127*   Lab Results  Component Value Date   CHOL 157 12/12/2015   HDL 46 12/12/2015   LDLCALC 81 12/12/2015   TRIG 149 12/12/2015   BNP (last 3 results)  Recent Labs  09/29/15 1415 12/11/15 2345 01/13/16 1544  BNP 550.7* 1024.0* 843.5*    ProBNP (last 3 results) No results for input(s): PROBNP in the last 8760 hours.   Diagnostic Studies/Procedures   Dg Chest 2 View  01/13/2016  CLINICAL DATA:  Shortness of  breath worse with exertion and 10 pound weight gain in past 2 days, history CHF, COPD, hypertension, atrial fibrillation, asthma, type II diabetes mellitus EXAM: CHEST  2 VIEW COMPARISON:  12/11/2015 FINDINGS: Enlargement of cardiac silhouette with pulmonary vascular congestion. Linear scarring versus chronic subsegmental atelectasis in the mid lungs bilaterally, stable. No acute pulmonary edema or segmental consolidation. No pleural effusion or pneumothorax. Scattered degenerative changes thoracic spine. IMPRESSION: Enlargement of cardiac silhouette with pulmonary vascular congestion. Persistent atelectasis versus scarring in mid lungs bilaterally without acute infiltrate. Electronically Signed   By: Ulyses Southward M.D.   On: 01/13/2016 16:43    Discharge Medications     Medication List    TAKE these medications        albuterol 108 (90 Base) MCG/ACT inhaler  Commonly known as:  PROVENTIL HFA;VENTOLIN HFA  Inhale 2 puffs into the lungs every 6 (six) hours as needed for wheezing or shortness of breath.     albuterol (2.5 MG/3ML) 0.083% nebulizer solution  Commonly known as:  PROVENTIL  Take 3 mLs (2.5 mg total) by nebulization every 4 (four) hours as needed for wheezing or shortness of breath.     allopurinol 100 MG tablet  Commonly known as:  ZYLOPRIM  TAKE 1 TABLET (100 MG TOTAL) BY MOUTH DAILY.  apixaban 5 MG Tabs tablet  Commonly known as:  ELIQUIS  Take 1 tablet (5 mg total) by mouth 2 (two) times daily.     baclofen 10 MG tablet  Commonly known as:  LIORESAL  TAKE 1 TABLET EVERY 8 HOURS AS NEEDED FOR MUSCLE SPASM(S)     carvedilol 25 MG tablet  Commonly known as:  COREG  Take 12.5 mg by mouth 2 (two) times daily with a meal.     diclofenac 1.3 % Ptch  Commonly known as:  FLECTOR  Place 1 patch onto the skin 2 (two) times daily as needed (pain).     DSS 100 MG Caps  Take 100 mg by mouth 2 (two) times daily.     glipiZIDE 5 MG tablet  Commonly known as:  GLUCOTROL  Take  5 mg by mouth 2 (two) times daily before a meal.     hydrALAZINE 50 MG tablet  Commonly known as:  APRESOLINE  Take 1 tablet (50 mg total) by mouth 3 (three) times daily.     ipratropium-albuterol 0.5-2.5 (3) MG/3ML Soln  Commonly known as:  DUONEB  Take 3 mLs by nebulization 4 (four) times daily.     isosorbide mononitrate 60 MG 24 hr tablet  Commonly known as:  IMDUR  Take 60 mg by mouth daily.     metolazone 2.5 MG tablet  Commonly known as:  ZAROXOLYN  Take 1 tablet (2.5 mg total) by mouth 2 (two) times a week. Tuesday and Friday     multivitamin with minerals tablet  Take 1 tablet by mouth daily.     omega-3 acid ethyl esters 1 g capsule  Commonly known as:  LOVAZA  Take 1 g by mouth daily.     oxyCODONE-acetaminophen 10-325 MG tablet  Commonly known as:  PERCOCET  Take 1 tablet by mouth every 8 (eight) hours as needed for pain. Use with stool softerner.     polyethylene glycol powder powder  Commonly known as:  GLYCOLAX/MIRALAX  MIX 1 CAPFUL (17GM)  IN  LIQUID  AND  DRINK EVERY DAY AS DIRECTED     potassium chloride SA 20 MEQ tablet  Commonly known as:  K-DUR,KLOR-CON  Take 2 tablets (40 mEq total) by mouth 2 (two) times daily. Take 2 tabs in AM and 1 tab in PM     tiotropium 18 MCG inhalation capsule  Commonly known as:  SPIRIVA HANDIHALER  Place 1 capsule (18 mcg total) into inhaler and inhale daily.     torsemide 20 MG tablet  Commonly known as:  DEMADEX  TAKE 5  TABLETS (100 MG) IN AM AND 4 TABLETS IN PM.        Disposition   The patient will be discharged in stable condition to home. Discharge Instructions    AMB Referral to [facility] Care Management    Complete by:  As directed   Reason for consult:  Post hospital follow up - active with [facility] prior to admission  Diagnoses of:  Heart Failure  Expected date of contact:  1-3 days (reserved for hospital discharges)  Patient just recently had community nurse and now assigned to Health Coach but please assign to  community nurse for transition of care calls and assess for home visits. Questions please call:  Charlesetta Shanks, RN BSN CCM [facility]  [phone] business mobile phone Toll free office [phone]     Diet - low sodium heart healthy    Complete by:  As directed  Heart Failure patients record your daily weight using the same scale at the same time of day    Complete by:  As directed      Increase activity slowly    Complete by:  As directed           Follow-up Information    Follow up with Arvilla Meres, MD On 01/28/2016.   Specialty:  Cardiology   Why:  at 2:20 Garage Code 0100   Contact information:   [address] [place] [address] [phone]         Duration of Discharge Encounter: Greater than 35 minutes   Signed, Tonye Becket NP-C  01/15/2016, 2:39 PM  Patient seen and examined with Tonye Becket, NP. We discussed all aspects of the encounter. I agree with the assessment and plan as stated above.   Volume status much better. Can go home today. Increase torsemide to 100/80. Long talk about need for dietary discretion and use of sliding scale diuretics.   Bensimhon, Daniel,MD 3:10 PM

## 2016-01-15 NOTE — ED Provider Notes (Signed)
CSN: [id number]     Arrival date & time 12/11/15  2329 History   First MD Initiated Contact with Patient 12/11/15 2346     Chief Complaint  Patient presents with  . Shortness of Breath      Patient is a 65 y.o. male presenting with shortness of breath. The history is provided by the patient. No language interpreter was used.  Shortness of Breath  Frank Davidson is a 65 y.o. male w/ hx/o HTN, COPD, CHF, cardiomyopathy, PVD, CKD who presents to the Emergency Department complaining of SOB. He reports sudden onset, gradual worsening SOB with abdominal distension and nausea.  Sxs started this morning.  He also reports 5lb weight gain since yesterday.  He has worsening SOB with exertion and lying supine.  He had a virus about two weeks ago and finished a course of abx last week.  He currently takes Eliquis.  He denies chest pain, fevers, cough, vomiting, dysuria, recent medication changes.    Past Medical History  Diagnosis Date  . Atrial fibrillation (HCC)     PAF  . Hypertension   . COPD (chronic obstructive pulmonary disease) (HCC)   . Ocular herpes   . Gout   . CHF (congestive heart failure) (HCC)   . Chronic pain   . Hyperlipidemia   . Nonischemic cardiomyopathy (HCC) Aug 2015    EF 15%  . Ocular herpes   . Neuropathy (HCC)   . Left-sided weakness     "because of the arthritis and edema"  . PVD (peripheral vascular disease) [facility]) May 2015    abnormal ABIs  . Memory impairment     "short term"  . CKD (chronic kidney disease), stage III     Dr. Briant Cedar follows-holding   . Fibromyalgia     neuropathy- hands, more than feet.  . Sleep apnea with use of continuous positive airway pressure (CPAP)     does not use cpap  . GERD (gastroesophageal reflux disease)     tums as needed  . Asthma   . Type II diabetes mellitus (HCC)   . Stroke [facility]) X 5    last stroke aug 2014"; denies residual on 09/12/2014  . Osteoarthritis   . Arthritis     "knees, right elbow, shoulder"  (09/12/2014)  . Shortness of breath dyspnea    Past Surgical History  Procedure Laterality Date  . 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 Tonsillectomy  age 9  . Esophagogastroduodenoscopy N/A 12/24/2013    Procedure: ESOPHAGOGASTRODUODENOSCOPY (EGD);  Surgeon: Louis Meckel, MD;  Location: [facility];  Service: Endoscopy;  Laterality: N/A;  . Colonoscopy N/A 12/24/2013    Procedure: COLONOSCOPY;  Surgeon: Louis Meckel, MD;  Location: [facility] ENDOSCOPY;  Service: Endoscopy;  Laterality: N/A;  . Joint replacement    . Cardiac catheterization  10/2006    normal coronary arteries;   Marland Kitchen Cataract extraction w/ intraocular lens  implant, bilateral Bilateral ~ 2008  . Total hip arthroplasty Left 09/12/2014    Procedure: LEFT TOTAL HIP ARTHROPLASTY ANTERIOR APPROACH;  Surgeon: [name], MD;  Location: [facility] OR;  Service: Orthopedics;  Laterality: Left;  . Right heart catheterization N/A 08/13/2014    Rt ht cath prior to hip surgery   Family History  Problem Relation Age of Onset  . Cancer Mother   . Hypertension Mother   . Lung disease Mother   . Diabetes Father   . Diabetes Sister   . Hypertension Sister   .  Hypertension Brother   . Hypertension Sister   . Colon cancer Neg Hx    Social History  Substance Use Topics  . Smoking status: Former Smoker -- 0.50 packs/day for 13 years    Types: Cigarettes    Quit date: 12/18/2006  . Smokeless tobacco: Never Used  . Alcohol Use: No     Comment: Quit 2008    Review of Systems  Respiratory: Positive for shortness of breath.   All other systems reviewed and are negative.     Allergies  Corlanor; Melina Schools; Lunesta; and Actos  Home Medications   Prior to Admission medications   Medication Sig Start Date End Date Taking? Authorizing Provider  albuterol (PROVENTIL HFA;VENTOLIN HFA) 108 (90 BASE) MCG/ACT inhaler Inhale 2 puffs into the lungs every 6 (six) hours as needed for wheezing or shortness of breath. 06/30/15  Yes Gwenlyn Found Copland, MD  albuterol (PROVENTIL) (2.5 MG/3ML) 0.083% nebulizer solution Take 3 mLs (2.5 mg total) by nebulization every 4 (four) hours as needed for wheezing or shortness of breath. 11/26/15  Yes Carmelina Dane, MD  allopurinol (ZYLOPRIM) 100 MG tablet TAKE 1 TABLET (100 MG TOTAL) BY MOUTH DAILY. 10/30/15  Yes Gwenlyn Found Copland, MD  apixaban (ELIQUIS) 5 MG TABS tablet Take 1 tablet (5 mg total) by mouth 2 (two) times daily. 02/04/15  Yes Dolores Patty, MD  carvedilol (COREG) 25 MG tablet Take 12.5 mg by mouth 2 (two) times daily with a meal.   Yes Historical Provider, MD  diclofenac (FLECTOR) 1.3 % PTCH Place 1 patch onto the skin 2 (two) times daily as needed (pain). 11/11/15  Yes Gwenlyn Found Copland, MD  docusate sodium 100 MG CAPS Take 100 mg by mouth 2 (two) times daily. 09/16/14  Yes Matthew Babish, PA-C  glipiZIDE (GLUCOTROL) 5 MG tablet Take 5 mg by mouth 2 (two) times daily before a meal.   Yes Historical Provider, MD  hydrALAZINE (APRESOLINE) 50 MG tablet Take 1 tablet (50 mg total) by mouth 3 (three) times daily. 09/15/15  Yes Laurey Morale, MD  isosorbide mononitrate (IMDUR) 60 MG 24 hr tablet Take 60 mg by mouth daily.   Yes Historical Provider, MD  metolazone (ZAROXOLYN) 2.5 MG tablet Take 1 tablet (2.5 mg total) by mouth 2 (two) times a week. Tuesday and Friday 09/29/15  Yes Dolores Patty, MD  omega-3 acid ethyl esters (LOVAZA) 1 G capsule Take 1 g by mouth daily.   Yes Historical Provider, MD  polyethylene glycol powder (GLYCOLAX/MIRALAX) powder MIX 1 CAPFUL (17GM)  IN  LIQUID  AND  DRINK EVERY DAY AS DIRECTED 07/31/15  Yes Gwenlyn Found Copland, MD  tiotropium (SPIRIVA HANDIHALER) 18 MCG inhalation capsule Place 1 capsule (18 mcg total) into inhaler and inhale daily. 09/29/15  Yes Michele Mcalpine, MD  baclofen (LIORESAL) 10 MG tablet TAKE 1 TABLET EVERY 8 HOURS AS NEEDED FOR MUSCLE SPASM(S) 01/12/16   Wallis Bamberg, PA-C  ipratropium-albuterol (DUONEB) 0.5-2.5 (3) MG/3ML SOLN Take 3  mLs by nebulization 4 (four) times daily. 12/23/15   Michele Mcalpine, MD  Multiple Vitamins-Minerals (MULTIVITAMIN WITH MINERALS) tablet Take 1 tablet by mouth daily.    Historical Provider, MD  oxyCODONE-acetaminophen (PERCOCET) 10-325 MG tablet Take 1 tablet by mouth every 8 (eight) hours as needed for pain. Use with stool softerner. 01/13/16   Morrell Riddle, PA-C  potassium chloride SA (K-DUR,KLOR-CON) 20 MEQ tablet Take 2 tablets (40 mEq total) by mouth 2 (two) times daily. Take  2 tabs in AM and 1 tab in PM 01/15/16   Amy D Clegg, NP  torsemide (DEMADEX) 20 MG tablet TAKE 5  TABLETS (100 MG) IN AM AND 4 TABLETS IN PM. 01/15/16   Amy D Clegg, NP   BP 117/78 mmHg  Pulse 73  Temp(Src) 97.8 F (36.6 C) (Oral)  Resp 20  Ht 5\' 10"  (1.778 m)  Wt 219 lb 3.2 oz (99.428 kg)  BMI 31.45 kg/m2  SpO2 95% Physical Exam  Constitutional: He is oriented to person, place, and time. He appears well-developed and well-nourished.  HENT:  Head: Normocephalic and atraumatic.  Cardiovascular: Normal rate and regular rhythm.   No murmur heard. Pulmonary/Chest: Effort normal and breath sounds normal. No respiratory distress.  Abdominal: Soft. There is no rebound and no guarding.  Small umbilical hernia that is easily reducible, mild suprapubic tenderness.    Musculoskeletal: He exhibits no edema or tenderness.  Neurological: He is alert and oriented to person, place, and time.  Skin: Skin is warm and dry.  Psychiatric: He has a normal mood and affect. His behavior is normal.  Nursing note and vitals reviewed.   ED Course  Procedures (including critical care time) Labs Review Labs Reviewed  BASIC METABOLIC PANEL - Abnormal; Notable for the following:    Chloride 98 (*)    BUN 33 (*)    Creatinine, Ser 2.44 (*)    GFR calc non Af Amer 26 (*)    GFR calc Af Amer 30 (*)    All other components within normal limits  CBC - Abnormal; Notable for the following:    RBC 4.21 (*)    Hemoglobin 11.8 (*)    HCT  37.4 (*)    RDW 17.9 (*)    All other components within normal limits  BRAIN NATRIURETIC PEPTIDE - Abnormal; Notable for the following:    B Natriuretic Peptide 1024.0 (*)    All other components within normal limits  HEMOGLOBIN A1C - Abnormal; Notable for the following:    Hgb A1c MFr Bld 5.7 (*)    All other components within normal limits  GLUCOSE, CAPILLARY - Abnormal; Notable for the following:    Glucose-Capillary 139 (*)    All other components within normal limits  BASIC METABOLIC PANEL - Abnormal; Notable for the following:    Potassium 3.4 (*)    Chloride 97 (*)    BUN 32 (*)    Creatinine, Ser 2.37 (*)    GFR calc non Af Amer 27 (*)    GFR calc Af Amer 31 (*)    All other components within normal limits  CBC - Abnormal; Notable for the following:    Hemoglobin 12.6 (*)    RDW 17.7 (*)    All other components within normal limits  COMPREHENSIVE METABOLIC PANEL - Abnormal; Notable for the following:    Chloride 96 (*)    BUN 39 (*)    Creatinine, Ser 2.56 (*)    GFR calc non Af Amer 25 (*)    GFR calc Af Amer 29 (*)    All other components within normal limits  GLUCOSE, CAPILLARY - Abnormal; Notable for the following:    Glucose-Capillary 107 (*)    All other components within normal limits  GLUCOSE, CAPILLARY - Abnormal; Notable for the following:    Glucose-Capillary 105 (*)    All other components within normal limits  MRSA PCR SCREENING  URINALYSIS, ROUTINE W REFLEX MICROSCOPIC (NOT AT [facility])  GLUCOSE, CAPILLARY  TSH  LIPID PANEL  GLUCOSE, CAPILLARY  GLUCOSE, CAPILLARY  GLUCOSE, CAPILLARY  I-STAT TROPOININ, ED    Imaging Review Dg Chest 2 View  01/13/2016  CLINICAL DATA:  Shortness of breath worse with exertion and 10 pound weight gain in past 2 days, history CHF, COPD, hypertension, atrial fibrillation, asthma, type II diabetes mellitus EXAM: CHEST  2 VIEW COMPARISON:  12/11/2015 FINDINGS: Enlargement of cardiac silhouette with pulmonary vascular  congestion. Linear scarring versus chronic subsegmental atelectasis in the mid lungs bilaterally, stable. No acute pulmonary edema or segmental consolidation. No pleural effusion or pneumothorax. Scattered degenerative changes thoracic spine. IMPRESSION: Enlargement of cardiac silhouette with pulmonary vascular congestion. Persistent atelectasis versus scarring in mid lungs bilaterally without acute infiltrate. Electronically Signed   By: Ulyses Southward M.D.   On: 01/13/2016 16:43   I have personally reviewed and evaluated these images and lab results as part of my medical decision-making.   EKG Interpretation   Date/Time:  Thursday December 11 2015 23:47:36 EST Ventricular Rate:  76 PR Interval:  228 QRS Duration: 123 QT Interval:  437 QTC Calculation: 491 R Axis:   -57 Text Interpretation:  Sinus rhythm Ventricular premature complex Prolonged  PR interval Nonspecific IVCD with LAD Nonspecific T abnormalities, lateral  leads Confirmed by Lincoln Brigham [phone]) on 12/11/2015 11:56:11 PM      MDM   Final diagnoses:  Acute congestive heart failure, unspecified congestive heart failure type (HCC)    Pt here with increased SOB, DOE, orthopnea.  Presentation c/w acute CHF, plan to admit for further treatment.      Tilden Fossa, MD 01/15/16 [phone]

## 2016-01-17 ENCOUNTER — Other Ambulatory Visit: Payer: Self-pay | Admitting: *Deleted

## 2016-01-17 NOTE — Patient Outreach (Addendum)
[facility]) Care Management  01/16/2016  Frank Davidson 07-18-1951 [id number]   Transition of care (week 1)  RN contacted pt today and reintroduced the [facility] services and the purpose of today's call. Pt very familiar with [facility] services with pass home visits and case management services. Pt reports an understands why he was hospitalized and able to recite no symptoms of HF and indicates he is in the GREEN zone at this time with no symptoms. Pt states his primary doctor Dr. Patsy Lager has relocated and he will need to call the office to see if he has be reassigned to another provider in the same office. RN stress the importance of informing his primary doctor of his recent admit for a requested visits for a post-op office visit (pt with understanding).  Pt also verified [facility] will be involved and has contacted him for services. Pt reports Katie from paramedics continues to visit with ongoing monitoring of pt's HF. RN reiterated on case management services that involves once again home visits. Pt remains hesitant but remains receptive to the ongoing telephonic contacts. RN strongly encouraged pt to continue with daily weights and report any fluid retention that is 3 lbs overnight or 5 lbs within one week with any distressful symptoms ( pt with understanding). RN offered to follow up on Monday with ongoing transition of care calls to inquire on his management of care as pt receptive. Plan of care discussed concerning contact for appointment with his primary provider within the next 1-2 weeks,  Daily weights and prevention measures to avoid readmit related to HF. Will follow up on Monday as agreed.  [name], RN Care Management Coordinator [facility] Main Office [phone]

## 2016-01-19 ENCOUNTER — Other Ambulatory Visit: Payer: Self-pay | Admitting: *Deleted

## 2016-01-19 ENCOUNTER — Ambulatory Visit: Payer: Commercial Managed Care - HMO | Admitting: *Deleted

## 2016-01-19 ENCOUNTER — Telehealth: Payer: Self-pay

## 2016-01-19 NOTE — Telephone Encounter (Signed)
REQUESTING  REFERRAL to eye doctor    NEEDS his PCP to do a referral to Dr. At [phone]  His number is [phone]

## 2016-01-19 NOTE — Patient Outreach (Signed)
[facility]) Care Management  01/19/2016  ZIER PALANCA 08/02/1951 [id number]   Transition of care (week 2)  RN attempted outreach call to pt today however unsuccessful. RN able to leave a  HIPAA approved voice message requesting a call back. Currently awaiting call back however RN will continue outreach attempts.   [name], RN Care Management Coordinator [facility] Main Office [phone]

## 2016-01-20 NOTE — Telephone Encounter (Signed)
Spoke to patient.  He needs to change his PCP to another doctor, since he has Dr Patsy Lager listed for [facility].  He said he will call tomorrow and get that changed to Dr Nilda Simmer.  The ophthalmologist he is seeing is Dr Nelle Don for a routine eye exam.

## 2016-01-20 NOTE — Telephone Encounter (Signed)
Pt has [employer].

## 2016-01-23 ENCOUNTER — Telehealth: Payer: Self-pay | Admitting: Family Medicine

## 2016-01-23 DIAGNOSIS — E118 Type 2 diabetes mellitus with unspecified complications: Principal | ICD-10-CM

## 2016-01-23 DIAGNOSIS — IMO0002 Reserved for concepts with insufficient information to code with codable children: Secondary | ICD-10-CM

## 2016-01-23 DIAGNOSIS — E1165 Type 2 diabetes mellitus with hyperglycemia: Secondary | ICD-10-CM

## 2016-01-23 DIAGNOSIS — Z794 Long term (current) use of insulin: Principal | ICD-10-CM

## 2016-01-23 NOTE — Telephone Encounter (Signed)
Patient request referral to [facility], Dr Nelle Don. [address] [place], [place], [place] [address]  [phone]

## 2016-01-26 ENCOUNTER — Other Ambulatory Visit: Payer: Self-pay | Admitting: *Deleted

## 2016-01-26 ENCOUNTER — Other Ambulatory Visit: Payer: Self-pay

## 2016-01-26 DIAGNOSIS — N184 Chronic kidney disease, stage 4 (severe): Principal | ICD-10-CM

## 2016-01-26 DIAGNOSIS — E1122 Type 2 diabetes mellitus with diabetic chronic kidney disease: Secondary | ICD-10-CM

## 2016-01-26 NOTE — Patient Outreach (Signed)
[facility]) Care Management  01/26/2016  EHRIN ARRIZON November 01, 1951 [id number]   Transition of care ( week 3)  RN attempted outreach call to pt today however unsuccessful as pt's mail box was full and RN unable to leave a message. Will continue outreach calls accordingly for update and extend ongoing [facility] services for case and/or disease  management services.  [name], RN Care Management Coordinator [facility] Main Office [phone]

## 2016-01-27 NOTE — Telephone Encounter (Signed)
PT has been release from hospital and has not complied with Silver back outreach transition to home.Marland Kitchen.(just FYI)

## 2016-01-28 ENCOUNTER — Inpatient Hospital Stay (HOSPITAL_COMMUNITY): Admit: 2016-01-28 | Payer: Commercial Managed Care - HMO | Admitting: Internal Medicine

## 2016-01-29 ENCOUNTER — Other Ambulatory Visit: Payer: Self-pay | Admitting: *Deleted

## 2016-01-29 LAB — HM DIABETES EYE EXAM

## 2016-01-29 NOTE — Patient Outreach (Signed)
[facility]) Care Management  01/29/2016  ZIA GUARISCO [birth year] [id number]   Transition of care (week 3)  RN spoke with pt concerning progress with his ongoing recovery. Pt reports she weight over the last 2 days at 223lbs and last week was 220 lbs.  Reports recent visit to his provider who has requested pt to STOP his Metolazone indicating this was "affecting his kidney" based upon the recent labs. Pt will follow up in 2 weeks with this provider with update.  Pt denies any swelling and remains in the GREEN zone on his HF zone but has indicated some issues with "stomach pain".  Pt states he has had "tummy pain" over the last 2 weeks and has informed several of the involved agency on their visits while in the home but not his doctor (unaware). States he is urinating fine and now having regular "brown stools" but his stools were darker earlier in the week along with vomiting 2 days ago. Denies any pain and no other symptoms mentioned at this time. RN strongly encourage pt to notify his primary provider for possible interventions however if symptoms persist or gets worse to seek medical attention immediately. RN again extended community involvement with home visit as pt remains hesitate with all the other agencies visiting. Plan of care and goals discussed in addition to pt contacting his provider to report his most recent symptoms related to "tummy pain" ( pt verbalized understanding). Pt requested to follow up next week and if pt continues to refuse community case management for home visits will extend the office for a health coach for ongoing telephonic disease management.  No other request or inquires at this time as to expressed how grateful he was for the call today. Will schedule next weeks' transition of care contact and follow up accordingly.  [name], RN Care Management Coordinator [facility] Main Office [phone]

## 2016-01-30 ENCOUNTER — Telehealth (HOSPITAL_COMMUNITY): Payer: Self-pay | Admitting: Cardiology

## 2016-01-30 ENCOUNTER — Other Ambulatory Visit (HOSPITAL_COMMUNITY): Payer: Self-pay | Admitting: Internal Medicine

## 2016-01-30 ENCOUNTER — Encounter: Payer: Self-pay | Admitting: Vascular Surgery

## 2016-01-30 NOTE — Telephone Encounter (Signed)
Deanna Artis, RN with [facility] called during patients [facility] visit to report 5 lb weight gain since Monday Increased SOB (visible during conversation), increased mild LE edema  Metolazone was held earlier in the week   Per vo Otilio Saber, PA Patient should take a dose of metolazone today however is symptoms do not get better or if weight does not decrease patient should report to ER for further evaluation   [facility]/ patient aware

## 2016-02-02 ENCOUNTER — Encounter (HOSPITAL_COMMUNITY): Payer: Self-pay | Admitting: *Deleted

## 2016-02-02 ENCOUNTER — Inpatient Hospital Stay (HOSPITAL_COMMUNITY)
Admission: EM | Admit: 2016-02-02 | Discharge: 2016-02-12 | DRG: 286 | Disposition: A | Discharge status: Home / Self Care | Payer: Commercial Managed Care - HMO | Attending: Internal Medicine | Admitting: Internal Medicine

## 2016-02-02 ENCOUNTER — Other Ambulatory Visit: Payer: Self-pay | Admitting: *Deleted

## 2016-02-02 ENCOUNTER — Emergency Department (HOSPITAL_COMMUNITY): Payer: Commercial Managed Care - HMO

## 2016-02-02 DIAGNOSIS — K219 Gastro-esophageal reflux disease without esophagitis: Secondary | ICD-10-CM | POA: Diagnosis present

## 2016-02-02 DIAGNOSIS — I130 Hypertensive heart and chronic kidney disease with heart failure and stage 1 through stage 4 chronic kidney disease, or unspecified chronic kidney disease: Principal | ICD-10-CM | POA: Diagnosis present

## 2016-02-02 DIAGNOSIS — J9601 Acute respiratory failure with hypoxia: Secondary | ICD-10-CM | POA: Diagnosis present

## 2016-02-02 DIAGNOSIS — I480 Paroxysmal atrial fibrillation: Secondary | ICD-10-CM | POA: Diagnosis present

## 2016-02-02 DIAGNOSIS — M109 Gout, unspecified: Secondary | ICD-10-CM | POA: Diagnosis present

## 2016-02-02 DIAGNOSIS — E876 Hypokalemia: Secondary | ICD-10-CM | POA: Diagnosis not present

## 2016-02-02 DIAGNOSIS — Z96642 Presence of left artificial hip joint: Secondary | ICD-10-CM | POA: Diagnosis present

## 2016-02-02 DIAGNOSIS — Z8673 Personal history of transient ischemic attack (TIA), and cerebral infarction without residual deficits: Secondary | ICD-10-CM

## 2016-02-02 DIAGNOSIS — D649 Anemia, unspecified: Secondary | ICD-10-CM | POA: Diagnosis present

## 2016-02-02 DIAGNOSIS — N179 Acute kidney failure, unspecified: Secondary | ICD-10-CM | POA: Diagnosis present

## 2016-02-02 DIAGNOSIS — M1990 Unspecified osteoarthritis, unspecified site: Secondary | ICD-10-CM | POA: Diagnosis present

## 2016-02-02 DIAGNOSIS — Z7901 Long term (current) use of anticoagulants: Secondary | ICD-10-CM

## 2016-02-02 DIAGNOSIS — Z6832 Body mass index (BMI) 32.0-32.9, adult: Secondary | ICD-10-CM

## 2016-02-02 DIAGNOSIS — E875 Hyperkalemia: Secondary | ICD-10-CM | POA: Diagnosis present

## 2016-02-02 DIAGNOSIS — R410 Disorientation, unspecified: Secondary | ICD-10-CM | POA: Diagnosis present

## 2016-02-02 DIAGNOSIS — I482 Chronic atrial fibrillation: Secondary | ICD-10-CM | POA: Diagnosis present

## 2016-02-02 DIAGNOSIS — M797 Fibromyalgia: Secondary | ICD-10-CM | POA: Diagnosis present

## 2016-02-02 DIAGNOSIS — R0602 Shortness of breath: Secondary | ICD-10-CM | POA: Diagnosis not present

## 2016-02-02 DIAGNOSIS — I739 Peripheral vascular disease, unspecified: Secondary | ICD-10-CM | POA: Diagnosis present

## 2016-02-02 DIAGNOSIS — E669 Obesity, unspecified: Secondary | ICD-10-CM | POA: Diagnosis present

## 2016-02-02 DIAGNOSIS — E785 Hyperlipidemia, unspecified: Secondary | ICD-10-CM | POA: Diagnosis present

## 2016-02-02 DIAGNOSIS — J441 Chronic obstructive pulmonary disease with (acute) exacerbation: Secondary | ICD-10-CM | POA: Diagnosis present

## 2016-02-02 DIAGNOSIS — Z7984 Long term (current) use of oral hypoglycemic drugs: Secondary | ICD-10-CM

## 2016-02-02 DIAGNOSIS — R570 Cardiogenic shock: Secondary | ICD-10-CM | POA: Diagnosis present

## 2016-02-02 DIAGNOSIS — N189 Chronic kidney disease, unspecified: Secondary | ICD-10-CM

## 2016-02-02 DIAGNOSIS — Z87891 Personal history of nicotine dependence: Secondary | ICD-10-CM

## 2016-02-02 DIAGNOSIS — G4733 Obstructive sleep apnea (adult) (pediatric): Secondary | ICD-10-CM | POA: Diagnosis present

## 2016-02-02 DIAGNOSIS — I429 Cardiomyopathy, unspecified: Secondary | ICD-10-CM | POA: Diagnosis present

## 2016-02-02 DIAGNOSIS — E1122 Type 2 diabetes mellitus with diabetic chronic kidney disease: Secondary | ICD-10-CM | POA: Diagnosis present

## 2016-02-02 DIAGNOSIS — N184 Chronic kidney disease, stage 4 (severe): Secondary | ICD-10-CM | POA: Diagnosis present

## 2016-02-02 DIAGNOSIS — I5023 Acute on chronic systolic (congestive) heart failure: Secondary | ICD-10-CM | POA: Diagnosis present

## 2016-02-02 DIAGNOSIS — G9341 Metabolic encephalopathy: Secondary | ICD-10-CM | POA: Diagnosis present

## 2016-02-02 MED ORDER — IPRATROPIUM BROMIDE 0.02 % IN SOLN: 1.0000 mg | Freq: Once | RESPIRATORY_TRACT | Status: DC

## 2016-02-02 MED ORDER — ALBUTEROL (5 MG/ML) CONTINUOUS INHALATION SOLN: 10.0000 mg/h | INHALATION_SOLUTION | RESPIRATORY_TRACT | Status: DC

## 2016-02-02 NOTE — ED Notes (Signed)
Pt to ED from home c/o shortness of breath. Hx of CHF and COPD. Initially sats 85% on fire arrival, placed on NRB, sats improved to 98%. Pt c/o worsening shortness of breath on exertion with excess fluid in abdomen. Pt has a home health nurse and is seen by community health nurse on 3/3; no weight gain at that time

## 2016-02-02 NOTE — ED Provider Notes (Addendum)
CSN: [id number]     Arrival date & time 02/02/16  2332 History  By signing my name below, I, Marisue Humble, attest that this documentation has been prepared under the direction and in the presence of Tomasita Crumble, MD . Electronically Signed: Marisue Humble, Scribe. 02/02/2016. 11:48 PM.   Chief Complaint  Patient presents with  . Shortness of Breath   The history is provided by the patient and the EMS personnel. No language interpreter was used.   HPI Comments:  Frank Davidson is a 65 y.o. male with PMHx of HTN, HLD, COPD, CHF,and CKD who presents to the Emergency Department via EMS complaining of persistent shortness of breath for the past two days, worsening tonight. Per EMS, SOB is worse with exertion. They also report pt c/o abdominal distention, lower extremity swelling, chest congestion, and feeling the need to cough. Pt reports his current symptoms feel like past CHF. Pt is not on oxygen at home. Pt denies chest pain.    Past Medical History  Diagnosis Date  . Atrial fibrillation (HCC)     PAF  . Hypertension   . COPD (chronic obstructive pulmonary disease) (HCC)   . Ocular herpes   . Gout   . CHF (congestive heart failure) (HCC)   . Chronic pain   . Hyperlipidemia   . Nonischemic cardiomyopathy (HCC) Aug 2015    EF 15%  . Ocular herpes   . Neuropathy (HCC)   . Left-sided weakness     "because of the arthritis and edema"  . PVD (peripheral vascular disease) [facility]) May 2015    abnormal ABIs  . Memory impairment     "short term"  . CKD (chronic kidney disease), stage III     Dr. Briant Cedar follows-holding   . Fibromyalgia     neuropathy- hands, more than feet.  . Sleep apnea with use of continuous positive airway pressure (CPAP)     does not use cpap  . GERD (gastroesophageal reflux disease)     tums as needed  . Asthma   . Type II diabetes mellitus (HCC)   . Stroke [facility]) X 5    last stroke aug 2014"; denies residual on 09/12/2014  . Osteoarthritis   .  Arthritis     "knees, right elbow, shoulder" (09/12/2014)  . Shortness of breath dyspnea    Past Surgical History  Procedure Laterality Date  . 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 Tonsillectomy  age 32  . Esophagogastroduodenoscopy N/A 12/24/2013    Procedure: ESOPHAGOGASTRODUODENOSCOPY (EGD);  Surgeon: Louis Meckel, MD;  Location: [facility];  Service: Endoscopy;  Laterality: N/A;  . Colonoscopy N/A 12/24/2013    Procedure: COLONOSCOPY;  Surgeon: Louis Meckel, MD;  Location: [facility] ENDOSCOPY;  Service: Endoscopy;  Laterality: N/A;  . Joint replacement    . Cardiac catheterization  10/2006    normal coronary arteries;   Marland Kitchen Cataract extraction w/ intraocular lens  implant, bilateral Bilateral ~ 2008  . Total hip arthroplasty Left 09/12/2014    Procedure: LEFT TOTAL HIP ARTHROPLASTY ANTERIOR APPROACH;  Surgeon: [name], MD;  Location: [facility] OR;  Service: Orthopedics;  Laterality: Left;  . Right heart catheterization N/A 08/13/2014    Rt ht cath prior to hip surgery   Family History  Problem Relation Age of Onset  . Cancer Mother   . Hypertension Mother   . Lung disease Mother   . Diabetes Father   . Diabetes Sister   . Hypertension  Sister   . Hypertension Brother   . Hypertension Sister   . Colon cancer Neg Hx    Social History  Substance Use Topics  . Smoking status: Former Smoker -- 0.50 packs/day for 13 years    Types: Cigarettes    Quit date: 12/18/2006  . Smokeless tobacco: Never Used  . Alcohol Use: No     Comment: Quit 2008    Review of Systems  HENT: Positive for congestion.   Respiratory: Positive for shortness of breath.   Cardiovascular: Positive for leg swelling. Negative for chest pain.  Gastrointestinal: Positive for abdominal distention.  All other systems reviewed and are negative.  Allergies  Corlanor; Melina Schools; Lunesta; and Actos  Home Medications   Prior to Admission medications   Medication Sig Start Date End Date Taking?  Authorizing Provider  albuterol (PROVENTIL HFA;VENTOLIN HFA) 108 (90 BASE) MCG/ACT inhaler Inhale 2 puffs into the lungs every 6 (six) hours as needed for wheezing or shortness of breath. 06/30/15   Gwenlyn Found Copland, MD  albuterol (PROVENTIL) (2.5 MG/3ML) 0.083% nebulizer solution Take 3 mLs (2.5 mg total) by nebulization every 4 (four) hours as needed for wheezing or shortness of breath. 11/26/15   Carmelina Dane, MD  allopurinol (ZYLOPRIM) 100 MG tablet TAKE 1 TABLET (100 MG TOTAL) BY MOUTH DAILY. 10/30/15   Pearline Cables, MD  apixaban (ELIQUIS) 5 MG TABS tablet Take 1 tablet (5 mg total) by mouth 2 (two) times daily. 02/04/15   Dolores Patty, MD  baclofen (LIORESAL) 10 MG tablet TAKE 1 TABLET EVERY 8 HOURS AS NEEDED FOR MUSCLE SPASM(S) 01/12/16   Wallis Bamberg, PA-C  carvedilol (COREG) 25 MG tablet Take 12.5 mg by mouth 2 (two) times daily with a meal.    Historical Provider, MD  diclofenac (FLECTOR) 1.3 % PTCH Place 1 patch onto the skin 2 (two) times daily as needed (pain). 11/11/15   Gwenlyn Found Copland, MD  docusate sodium 100 MG CAPS Take 100 mg by mouth 2 (two) times daily. 09/16/14   Lanney Gins, PA-C  glipiZIDE (GLUCOTROL) 5 MG tablet Take 5 mg by mouth 2 (two) times daily before a meal.    Historical Provider, MD  hydrALAZINE (APRESOLINE) 50 MG tablet Take 1 tablet (50 mg total) by mouth 3 (three) times daily. 09/15/15   Laurey Morale, MD  ipratropium-albuterol (DUONEB) 0.5-2.5 (3) MG/3ML SOLN Take 3 mLs by nebulization 4 (four) times daily. 12/23/15   Michele Mcalpine, MD  isosorbide mononitrate (IMDUR) 60 MG 24 hr tablet Take 60 mg by mouth daily.    Historical Provider, MD  metolazone (ZAROXOLYN) 2.5 MG tablet Take 1 tablet (2.5 mg total) by mouth 2 (two) times a week. Tuesday and Friday 09/29/15   Dolores Patty, MD  Multiple Vitamins-Minerals (MULTIVITAMIN WITH MINERALS) tablet Take 1 tablet by mouth daily.    Historical Provider, MD  omega-3 acid ethyl esters (LOVAZA) 1 G  capsule Take 1 g by mouth daily.    Historical Provider, MD  oxyCODONE-acetaminophen (PERCOCET) 10-325 MG tablet Take 1 tablet by mouth every 8 (eight) hours as needed for pain. Use with stool softerner. 01/13/16   Morrell Riddle, PA-C  polyethylene glycol powder (GLYCOLAX/MIRALAX) powder MIX 1 CAPFUL (17GM)  IN  LIQUID  AND  DRINK EVERY DAY AS DIRECTED 07/31/15   Gwenlyn Found Copland, MD  potassium chloride SA (K-DUR,KLOR-CON) 20 MEQ tablet Take 2 tablets (40 mEq total) by mouth 2 (two) times daily. Take 2 tabs in  AM and 1 tab in PM 01/15/16   Amy D Clegg, NP  tiotropium (SPIRIVA HANDIHALER) 18 MCG inhalation capsule Place 1 capsule (18 mcg total) into inhaler and inhale daily. 09/29/15   Michele Mcalpine, MD  torsemide (DEMADEX) 20 MG tablet TAKE 5  TABLETS (100 MG) IN AM AND 4 TABLETS IN PM. 01/15/16   Amy D Clegg, NP   BP 100/78 mmHg  Pulse 67  Temp(Src) 97.5 F (36.4 C) (Axillary)  Resp 14  SpO2 100% Physical Exam  Constitutional: He is oriented to person, place, and time. Vital signs are normal. He appears well-developed and well-nourished.  Non-toxic appearance. He does not appear ill. No distress.  HENT:  Head: Normocephalic and atraumatic.  Nose: Nose normal.  Mouth/Throat: Oropharynx is clear and moist. No oropharyngeal exudate.  Eyes: Conjunctivae and EOM are normal. Pupils are equal, round, and reactive to light. No scleral icterus.  Neck: Normal range of motion. Neck supple. No tracheal deviation, no edema, no erythema and normal range of motion present. No thyroid mass and no thyromegaly present.  Cardiovascular: Normal rate, regular rhythm, S1 normal, S2 normal, normal heart sounds, intact distal pulses and normal pulses.  Exam reveals no gallop and no friction rub.   No murmur heard. Pulmonary/Chest: He has no rhonchi. He has no rales.  Tachypnea; wheezing BL fields  Abdominal: Soft. Normal appearance and bowel sounds are normal. He exhibits no distension, no ascites and no mass. There is  no hepatosplenomegaly. There is no tenderness. There is no rebound, no guarding and no CVA tenderness.  Musculoskeletal: Normal range of motion. He exhibits no edema or tenderness.  Lymphadenopathy:    He has no cervical adenopathy.  Neurological: He is alert and oriented to person, place, and time. He has normal strength. No cranial nerve deficit or sensory deficit.  Skin: Skin is warm, dry and intact. No petechiae and no rash noted. He is not diaphoretic. No erythema. No pallor.  Psychiatric: He has a normal mood and affect. His behavior is normal. Judgment normal.  Nursing note and vitals reviewed.  ED Course  Procedures  DIAGNOSTIC STUDIES:  Oxygen Saturation is 98% on 2.5L/min Barney, normal by my interpretation.    COORDINATION OF CARE:  11:36 PM Will order chest x-ray, CBC, and BMP. Discussed treatment plan with pt at bedside and pt agreed to plan.  2:46 AM Re-evaluated pt. Pt states he is feeling better currently.   Labs Review Labs Reviewed  CBC WITH DIFFERENTIAL/PLATELET - Abnormal; Notable for the following:    Hemoglobin 11.8 (*)    HCT 38.0 (*)    RDW 17.9 (*)    Monocytes Absolute 1.2 (*)    All other components within normal limits  BRAIN NATRIURETIC PEPTIDE - Abnormal; Notable for the following:    B Natriuretic Peptide 750.3 (*)    All other components within normal limits  BASIC METABOLIC PANEL - Abnormal; Notable for the following:    Potassium 5.6 (*)    Chloride 90 (*)    BUN 124 (*)    Creatinine, Ser 3.39 (*)    GFR calc non Af Amer 18 (*)    GFR calc Af Amer 20 (*)    Anion gap 17 (*)    All other components within normal limits    Imaging Review Dg Chest Port 1 View  02/02/2016  CLINICAL DATA:  Shortness of breath EXAM: PORTABLE CHEST 1 VIEW COMPARISON:  01/13/2016 FINDINGS: Chronic cardiomegaly. Stable mediastinal contours. Chronic bilateral lung  scarring. There is no edema, consolidation, effusion, or pneumothorax. IMPRESSION: 1. No acute finding.  2. Chronic cardiomegaly and lung scarring. Electronically Signed   By: Marnee Spring M.D.   On: 02/02/2016 23:58   I have personally reviewed and evaluated these images and lab results as part of my medical decision-making.   EKG Interpretation   Date/Time:  Monday February 02 2016 23:42:36 EDT Ventricular Rate:  72 PR Interval:    QRS Duration: 108 QT Interval:  447 QTC Calculation: 489 R Axis:   -67 Text Interpretation:  Atrial fibrillation Left anterior fascicular block  Abnormal R-wave progression, late transition Nonspecific T abnormalities,  lateral leads Borderline prolonged QT interval No significant change since  last tracing Confirmed by Erroll Luna [phone]) on 02/02/2016  11:58:51 PM      MDM   Final diagnoses:  None    Patient presents to the ED for SOB.  He has CHF and COPD.  He is wheezing on exam and received albuterol, Ipratropium, magnesium, steroids for treatment. Upon repeat evaluation, his wheezing has resolved. However on room air he continues to be 88%. Patient is requiring nasal cannula. Will admit to hospitalist for further care. Chest x-ray does not show any effusions, BNP is 750. This is less likely CHF exacerbation.    CRITICAL CARE Performed by: Tomasita Crumble   Total critical care time: 40 minutes - resp distress  Critical care time was exclusive of separately billable procedures and treating other patients.  Critical care was necessary to treat or prevent imminent or life-threatening deterioration.  Critical care was time spent personally by me on the following activities: development of treatment plan with patient and/or surrogate as well as nursing, discussions with consultants, evaluation of patient's response to treatment, examination of patient, obtaining history from patient or surrogate, ordering and performing treatments and interventions, ordering and review of laboratory studies, ordering and review of radiographic studies, pulse  oximetry and re-evaluation of patient's condition.   I personally performed the services described in this documentation, which was scribed in my presence. The recorded information has been reviewed and is accurate.       Tomasita Crumble, MD 02/03/16 [phone]

## 2016-02-02 NOTE — Patient Outreach (Signed)
[facility]) Care Management  02/02/2016  LENNIS CALDEIRA 10-Mar-1951 [id number]   Transition of care (week 4)  RN spoke with pt today concerning his ongoing management of care related to his HF. Pt reports his weight for th elast two days at 223 lbs and last week was 224 lbs. Pt states he was doing well until getting up this morning with some breathing issues. Pt states he was going back to bed and see if his feels better. States he may have gained some fluid but his numbers have not changed. RN requested pt to contact his cardiologist or para medicine to intervene if he feels he is retaining fluid. Pt indicated he would contact his provider for interventions. RN offered once again to get involved being that today is the 4th transition of care. RN requested to follow up once again this week concerning possible interventions for today concerning his breathing issues. Pt receptive to a follow up call. Will follow up accordingly.  [name], RN Care Management Coordinator [facility] Main Office [phone]

## 2016-02-03 ENCOUNTER — Other Ambulatory Visit (HOSPITAL_COMMUNITY): Payer: Self-pay | Admitting: Internal Medicine

## 2016-02-03 ENCOUNTER — Encounter (HOSPITAL_COMMUNITY): Payer: Self-pay | Admitting: Internal Medicine

## 2016-02-03 DIAGNOSIS — M1990 Unspecified osteoarthritis, unspecified site: Secondary | ICD-10-CM | POA: Diagnosis present

## 2016-02-03 DIAGNOSIS — K219 Gastro-esophageal reflux disease without esophagitis: Secondary | ICD-10-CM | POA: Diagnosis present

## 2016-02-03 DIAGNOSIS — E876 Hypokalemia: Secondary | ICD-10-CM | POA: Diagnosis not present

## 2016-02-03 DIAGNOSIS — I5023 Acute on chronic systolic (congestive) heart failure: Secondary | ICD-10-CM | POA: Diagnosis not present

## 2016-02-03 DIAGNOSIS — N189 Chronic kidney disease, unspecified: Secondary | ICD-10-CM | POA: Diagnosis not present

## 2016-02-03 DIAGNOSIS — J9601 Acute respiratory failure with hypoxia: Secondary | ICD-10-CM

## 2016-02-03 DIAGNOSIS — I429 Cardiomyopathy, unspecified: Secondary | ICD-10-CM | POA: Diagnosis present

## 2016-02-03 DIAGNOSIS — M797 Fibromyalgia: Secondary | ICD-10-CM | POA: Diagnosis present

## 2016-02-03 DIAGNOSIS — I509 Heart failure, unspecified: Secondary | ICD-10-CM | POA: Diagnosis not present

## 2016-02-03 DIAGNOSIS — Z7984 Long term (current) use of oral hypoglycemic drugs: Secondary | ICD-10-CM | POA: Diagnosis not present

## 2016-02-03 DIAGNOSIS — G9341 Metabolic encephalopathy: Secondary | ICD-10-CM | POA: Diagnosis present

## 2016-02-03 DIAGNOSIS — N179 Acute kidney failure, unspecified: Secondary | ICD-10-CM | POA: Diagnosis not present

## 2016-02-03 DIAGNOSIS — R570 Cardiogenic shock: Secondary | ICD-10-CM | POA: Diagnosis present

## 2016-02-03 DIAGNOSIS — R0602 Shortness of breath: Secondary | ICD-10-CM | POA: Diagnosis present

## 2016-02-03 DIAGNOSIS — J441 Chronic obstructive pulmonary disease with (acute) exacerbation: Secondary | ICD-10-CM | POA: Diagnosis not present

## 2016-02-03 DIAGNOSIS — Z96642 Presence of left artificial hip joint: Secondary | ICD-10-CM | POA: Diagnosis present

## 2016-02-03 DIAGNOSIS — N184 Chronic kidney disease, stage 4 (severe): Secondary | ICD-10-CM | POA: Diagnosis present

## 2016-02-03 DIAGNOSIS — E875 Hyperkalemia: Secondary | ICD-10-CM | POA: Diagnosis present

## 2016-02-03 DIAGNOSIS — I480 Paroxysmal atrial fibrillation: Secondary | ICD-10-CM | POA: Diagnosis not present

## 2016-02-03 DIAGNOSIS — Z6832 Body mass index (BMI) 32.0-32.9, adult: Secondary | ICD-10-CM | POA: Diagnosis not present

## 2016-02-03 DIAGNOSIS — I484 Atypical atrial flutter: Secondary | ICD-10-CM | POA: Diagnosis not present

## 2016-02-03 DIAGNOSIS — R410 Disorientation, unspecified: Secondary | ICD-10-CM | POA: Diagnosis present

## 2016-02-03 DIAGNOSIS — E785 Hyperlipidemia, unspecified: Secondary | ICD-10-CM | POA: Diagnosis present

## 2016-02-03 DIAGNOSIS — I130 Hypertensive heart and chronic kidney disease with heart failure and stage 1 through stage 4 chronic kidney disease, or unspecified chronic kidney disease: Secondary | ICD-10-CM | POA: Diagnosis present

## 2016-02-03 DIAGNOSIS — E1122 Type 2 diabetes mellitus with diabetic chronic kidney disease: Secondary | ICD-10-CM | POA: Diagnosis present

## 2016-02-03 DIAGNOSIS — M109 Gout, unspecified: Secondary | ICD-10-CM | POA: Diagnosis present

## 2016-02-03 DIAGNOSIS — E669 Obesity, unspecified: Secondary | ICD-10-CM | POA: Diagnosis present

## 2016-02-03 DIAGNOSIS — Z87891 Personal history of nicotine dependence: Secondary | ICD-10-CM | POA: Diagnosis not present

## 2016-02-03 DIAGNOSIS — D649 Anemia, unspecified: Secondary | ICD-10-CM | POA: Diagnosis present

## 2016-02-03 DIAGNOSIS — Z7901 Long term (current) use of anticoagulants: Secondary | ICD-10-CM | POA: Diagnosis not present

## 2016-02-03 DIAGNOSIS — G4733 Obstructive sleep apnea (adult) (pediatric): Secondary | ICD-10-CM | POA: Diagnosis present

## 2016-02-03 DIAGNOSIS — I482 Chronic atrial fibrillation: Secondary | ICD-10-CM | POA: Diagnosis present

## 2016-02-03 DIAGNOSIS — Z8673 Personal history of transient ischemic attack (TIA), and cerebral infarction without residual deficits: Secondary | ICD-10-CM | POA: Diagnosis not present

## 2016-02-03 DIAGNOSIS — I739 Peripheral vascular disease, unspecified: Secondary | ICD-10-CM | POA: Diagnosis present

## 2016-02-03 LAB — BASIC METABOLIC PANEL
Anion gap: 17 — ABNORMAL HIGH (ref 5–15)
BUN: 124 mg/dL — AB (ref 6–20)
CHLORIDE: 90 mmol/L — AB (ref 101–111)
CO2: 30 mmol/L (ref 22–32)
CREATININE: 3.39 mg/dL — AB (ref 0.61–1.24)
Calcium: 9.8 mg/dL (ref 8.9–10.3)
GFR calc Af Amer: 20 mL/min — ABNORMAL LOW (ref >60)
GFR calc non Af Amer: 18 mL/min — ABNORMAL LOW (ref >60)
Glucose, Bld: 96 mg/dL (ref 65–99)
Potassium: 5.6 mmol/L — ABNORMAL HIGH (ref 3.5–5.1)
SODIUM: 137 mmol/L (ref 135–145)

## 2016-02-03 LAB — CBC WITH DIFFERENTIAL/PLATELET
BASOS PCT: 0 %
BASOS PCT: 0 %
Basophils Absolute: 0 10*3/uL (ref 0.0–0.1)
Basophils Absolute: 0 10*3/uL (ref 0.0–0.1)
EOS ABS: 0 10*3/uL (ref 0.0–0.7)
EOS PCT: 3 %
Eosinophils Absolute: 0.3 10*3/uL (ref 0.0–0.7)
Eosinophils Relative: 0 %
HCT: 39.8 % (ref 39.0–52.0)
HEMATOCRIT: 38 % — AB (ref 39.0–52.0)
Hemoglobin: 11.8 g/dL — ABNORMAL LOW (ref 13.0–17.0)
Hemoglobin: 12.4 g/dL — ABNORMAL LOW (ref 13.0–17.0)
LYMPHS ABS: 0.7 10*3/uL (ref 0.7–4.0)
Lymphocytes Relative: 16 %
Lymphocytes Relative: 9 %
Lymphs Abs: 1.6 10*3/uL (ref 0.7–4.0)
MCH: 26.9 pg (ref 26.0–34.0)
MCH: 27.4 pg (ref 26.0–34.0)
MCHC: 31.1 g/dL (ref 30.0–36.0)
MCHC: 31.2 g/dL (ref 30.0–36.0)
MCV: 86.8 fL (ref 78.0–100.0)
MCV: 87.9 fL (ref 78.0–100.0)
MONO ABS: 1.2 10*3/uL — AB (ref 0.1–1.0)
MONO ABS: 0.2 10*3/uL (ref 0.1–1.0)
MONOS PCT: 11 %
MONOS PCT: 3 %
NEUTROS ABS: 7.2 10*3/uL (ref 1.7–7.7)
Neutro Abs: 7.2 10*3/uL (ref 1.7–7.7)
Neutrophils Relative %: 70 %
Neutrophils Relative %: 88 %
PLATELETS: 207 10*3/uL (ref 150–400)
Platelets: 186 10*3/uL (ref 150–400)
RBC: 4.38 MIL/uL (ref 4.22–5.81)
RBC: 4.53 MIL/uL (ref 4.22–5.81)
RDW: 17.9 % — AB (ref 11.5–15.5)
RDW: 17.8 % — AB (ref 11.5–15.5)
WBC: 10.2 10*3/uL (ref 4.0–10.5)
WBC: 8.1 10*3/uL (ref 4.0–10.5)

## 2016-02-03 LAB — COMPREHENSIVE METABOLIC PANEL
ALT: 18 U/L (ref 17–63)
AST: 30 U/L (ref 15–41)
Albumin: 4.1 g/dL (ref 3.5–5.0)
Alkaline Phosphatase: 66 U/L (ref 38–126)
Anion gap: 17 — ABNORMAL HIGH (ref 5–15)
BILIRUBIN TOTAL: 1.1 mg/dL (ref 0.3–1.2)
BUN: 121 mg/dL — AB (ref 6–20)
CALCIUM: 10.6 mg/dL — AB (ref 8.9–10.3)
CO2: 33 mmol/L — AB (ref 22–32)
CREATININE: 3.32 mg/dL — AB (ref 0.61–1.24)
Chloride: 89 mmol/L — ABNORMAL LOW (ref 101–111)
GFR calc Af Amer: 21 mL/min — ABNORMAL LOW (ref >60)
GFR, EST NON AFRICAN AMERICAN: 18 mL/min — AB (ref >60)
GLUCOSE: 154 mg/dL — AB (ref 65–99)
POTASSIUM: 3 mmol/L — AB (ref 3.5–5.1)
Sodium: 139 mmol/L (ref 135–145)
Total Protein: 8.4 g/dL — ABNORMAL HIGH (ref 6.5–8.1)

## 2016-02-03 LAB — URINALYSIS, ROUTINE W REFLEX MICROSCOPIC
BILIRUBIN URINE: NEGATIVE
Glucose, UA: NEGATIVE mg/dL
Hgb urine dipstick: NEGATIVE
KETONES UR: NEGATIVE mg/dL
Leukocytes, UA: NEGATIVE
NITRITE: NEGATIVE
PH: 7 (ref 5.0–8.0)
Protein, ur: NEGATIVE mg/dL
Specific Gravity, Urine: 1.011 (ref 1.005–1.030)

## 2016-02-03 LAB — TROPONIN I: Troponin I: 0.12 ng/mL — ABNORMAL HIGH (ref <0.031)

## 2016-02-03 LAB — GLUCOSE, CAPILLARY
GLUCOSE-CAPILLARY: 91 mg/dL (ref 65–99)
GLUCOSE-CAPILLARY: 137 mg/dL — AB (ref 65–99)

## 2016-02-03 LAB — CREATININE, URINE, RANDOM: Creatinine, Urine: 60.07 mg/dL

## 2016-02-03 LAB — BRAIN NATRIURETIC PEPTIDE: B Natriuretic Peptide: 750.3 pg/mL — ABNORMAL HIGH (ref 0.0–100.0)

## 2016-02-03 LAB — CBG MONITORING, ED
GLUCOSE-CAPILLARY: 216 mg/dL — AB (ref 65–99)
GLUCOSE-CAPILLARY: 191 mg/dL — AB (ref 65–99)

## 2016-02-03 LAB — SODIUM, URINE, RANDOM: SODIUM UR: 48 mmol/L

## 2016-02-03 MED ORDER — FUROSEMIDE 10 MG/ML IJ SOLN
80.0000 mg | Freq: Once | INTRAMUSCULAR | Status: AC
  Administered 2016-02-03: 80 mg via INTRAVENOUS
  Filled 2016-02-03: qty 8

## 2016-02-03 MED ORDER — BUDESONIDE 0.5 MG/2ML IN SUSP
0.2500 mg | Freq: Two times a day (BID) | RESPIRATORY_TRACT | Status: DC
  Administered 2016-02-03: 10:00:00 via RESPIRATORY_TRACT
  Administered 2016-02-03: 0.5 mg via RESPIRATORY_TRACT
  Filled 2016-02-03 (×4): qty 2

## 2016-02-03 MED ORDER — CALCIUM GLUCONATE 10 % IV SOLN
2.0000 g | Freq: Once | INTRAVENOUS | Status: AC
  Administered 2016-02-03: 2 g via INTRAVENOUS
  Filled 2016-02-03: qty 20

## 2016-02-03 MED ORDER — BACLOFEN 10 MG PO TABS
10.0000 mg | ORAL_TABLET | Freq: Three times a day (TID) | ORAL | Status: DC | PRN
  Administered 2016-02-03 – 2016-02-11 (×11): 10 mg via ORAL
  Filled 2016-02-03 (×15): qty 1

## 2016-02-03 MED ORDER — DOCUSATE SODIUM 100 MG PO CAPS
100.0000 mg | ORAL_CAPSULE | Freq: Two times a day (BID) | ORAL | Status: DC
  Administered 2016-02-03 – 2016-02-12 (×18): 100 mg via ORAL
  Filled 2016-02-03 (×18): qty 1

## 2016-02-03 MED ORDER — OXYCODONE HCL 5 MG PO TABS
5.0000 mg | ORAL_TABLET | Freq: Three times a day (TID) | ORAL | Status: DC | PRN
  Administered 2016-02-04 – 2016-02-08 (×8): 5 mg via ORAL
  Filled 2016-02-03 (×9): qty 1

## 2016-02-03 MED ORDER — OMEGA-3-ACID ETHYL ESTERS 1 G PO CAPS
1.0000 g | ORAL_CAPSULE | Freq: Every day | ORAL | Status: DC
  Administered 2016-02-03 – 2016-02-12 (×10): 1 g via ORAL
  Filled 2016-02-03 (×10): qty 1

## 2016-02-03 MED ORDER — IPRATROPIUM-ALBUTEROL 0.5-2.5 (3) MG/3ML IN SOLN
3.0000 mL | RESPIRATORY_TRACT | Status: DC
  Administered 2016-02-03 – 2016-02-04 (×8): 3 mL via RESPIRATORY_TRACT
  Filled 2016-02-03 (×7): qty 3

## 2016-02-03 MED ORDER — ONDANSETRON HCL 4 MG/2ML IJ SOLN
4.0000 mg | Freq: Four times a day (QID) | INTRAMUSCULAR | Status: DC | PRN
  Administered 2016-02-08 – 2016-02-12 (×2): 4 mg via INTRAVENOUS
  Filled 2016-02-03 (×2): qty 2

## 2016-02-03 MED ORDER — HYDRALAZINE HCL 25 MG PO TABS
25.0000 mg | ORAL_TABLET | Freq: Three times a day (TID) | ORAL | Status: DC
  Administered 2016-02-03 – 2016-02-07 (×12): 25 mg via ORAL
  Filled 2016-02-03 (×12): qty 1

## 2016-02-03 MED ORDER — TORSEMIDE 20 MG PO TABS: 80.0000 mg | ORAL_TABLET | Freq: Every day | ORAL | Status: DC

## 2016-02-03 MED ORDER — MAGNESIUM SULFATE 2 GM/50ML IV SOLN
2.0000 g | Freq: Once | INTRAVENOUS | Status: AC
  Administered 2016-02-03: 2 g via INTRAVENOUS
  Filled 2016-02-03: qty 50

## 2016-02-03 MED ORDER — PREDNISONE 20 MG PO TABS
60.0000 mg | ORAL_TABLET | Freq: Once | ORAL | Status: AC
  Administered 2016-02-03: 60 mg via ORAL
  Filled 2016-02-03: qty 3

## 2016-02-03 MED ORDER — TORSEMIDE 100 MG PO TABS
100.0000 mg | ORAL_TABLET | Freq: Every day | ORAL | Status: DC
  Administered 2016-02-03: 100 mg via ORAL
  Filled 2016-02-03: qty 1

## 2016-02-03 MED ORDER — FUROSEMIDE 10 MG/ML IJ SOLN: 80.0000 mg | Freq: Once | INTRAMUSCULAR | Status: DC

## 2016-02-03 MED ORDER — OXYCODONE-ACETAMINOPHEN 10-325 MG PO TABS: 1.0000 | ORAL_TABLET | Freq: Three times a day (TID) | ORAL | Status: DC | PRN

## 2016-02-03 MED ORDER — ALBUTEROL SULFATE (2.5 MG/3ML) 0.083% IN NEBU: 2.5000 mg | INHALATION_SOLUTION | RESPIRATORY_TRACT | Status: DC | PRN

## 2016-02-03 MED ORDER — HYDRALAZINE HCL 25 MG PO TABS
50.0000 mg | ORAL_TABLET | Freq: Three times a day (TID) | ORAL | Status: DC
  Administered 2016-02-03: 50 mg via ORAL
  Filled 2016-02-03: qty 2

## 2016-02-03 MED ORDER — OXYCODONE-ACETAMINOPHEN 5-325 MG PO TABS
1.0000 | ORAL_TABLET | Freq: Three times a day (TID) | ORAL | Status: DC | PRN
  Administered 2016-02-04 – 2016-02-08 (×9): 1 via ORAL
  Filled 2016-02-03 (×10): qty 1

## 2016-02-03 MED ORDER — METHYLPREDNISOLONE SODIUM SUCC 40 MG IJ SOLR
40.0000 mg | Freq: Every day | INTRAMUSCULAR | Status: DC
  Administered 2016-02-03 – 2016-02-04 (×2): 40 mg via INTRAVENOUS
  Filled 2016-02-03 (×2): qty 1

## 2016-02-03 MED ORDER — ALLOPURINOL 100 MG PO TABS
100.0000 mg | ORAL_TABLET | Freq: Every day | ORAL | Status: DC
  Administered 2016-02-03 – 2016-02-12 (×10): 100 mg via ORAL
  Filled 2016-02-03 (×10): qty 1

## 2016-02-03 MED ORDER — APIXABAN 5 MG PO TABS
5.0000 mg | ORAL_TABLET | Freq: Two times a day (BID) | ORAL | Status: DC
Start: 2016-02-03 — End: 2016-02-06
  Administered 2016-02-03 – 2016-02-06 (×7): 5 mg via ORAL
  Filled 2016-02-03 (×7): qty 1

## 2016-02-03 MED ORDER — CARVEDILOL 12.5 MG PO TABS
12.5000 mg | ORAL_TABLET | Freq: Two times a day (BID) | ORAL | Status: DC
  Administered 2016-02-03 – 2016-02-07 (×9): 12.5 mg via ORAL
  Filled 2016-02-03 (×9): qty 1

## 2016-02-03 MED ORDER — ALBUTEROL (5 MG/ML) CONTINUOUS INHALATION SOLN
10.0000 mg/h | INHALATION_SOLUTION | RESPIRATORY_TRACT | Status: DC
  Administered 2016-02-03: 10 mg/h via RESPIRATORY_TRACT
  Filled 2016-02-03 (×2): qty 20

## 2016-02-03 MED ORDER — IPRATROPIUM BROMIDE 0.02 % IN SOLN
1.0000 mg | Freq: Once | RESPIRATORY_TRACT | Status: AC
  Administered 2016-02-03: 1 mg via RESPIRATORY_TRACT
  Filled 2016-02-03: qty 5

## 2016-02-03 MED ORDER — METOLAZONE 2.5 MG PO TABS
2.5000 mg | ORAL_TABLET | ORAL | Status: DC
Start: 2016-02-05 — End: 2016-02-08
  Administered 2016-02-05: 2.5 mg via ORAL
  Filled 2016-02-03 (×2): qty 1

## 2016-02-03 MED ORDER — IPRATROPIUM-ALBUTEROL 0.5-2.5 (3) MG/3ML IN SOLN
RESPIRATORY_TRACT | Status: AC
  Filled 2016-02-03: qty 3

## 2016-02-03 MED ORDER — GLIPIZIDE 5 MG PO TABS
5.0000 mg | ORAL_TABLET | Freq: Two times a day (BID) | ORAL | Status: DC
  Administered 2016-02-03 – 2016-02-09 (×13): 5 mg via ORAL
  Filled 2016-02-03 (×16): qty 1

## 2016-02-03 MED ORDER — POTASSIUM CHLORIDE CRYS ER 20 MEQ PO TBCR
40.0000 meq | EXTENDED_RELEASE_TABLET | Freq: Two times a day (BID) | ORAL | Status: DC
  Administered 2016-02-03 – 2016-02-12 (×19): 40 meq via ORAL
  Filled 2016-02-03 (×20): qty 2

## 2016-02-03 MED ORDER — IPRATROPIUM BROMIDE 0.02 % IN SOLN
0.5000 mg | RESPIRATORY_TRACT | Status: DC
  Administered 2016-02-03 (×2): 0.5 mg via RESPIRATORY_TRACT
  Filled 2016-02-03 (×2): qty 2.5

## 2016-02-03 MED ORDER — DOXYCYCLINE HYCLATE 100 MG IV SOLR
100.0000 mg | Freq: Two times a day (BID) | INTRAVENOUS | Status: DC
  Administered 2016-02-03 – 2016-02-04 (×3): 100 mg via INTRAVENOUS
  Filled 2016-02-03 (×4): qty 100

## 2016-02-03 MED ORDER — INSULIN ASPART 100 UNIT/ML ~~LOC~~ SOLN
0.0000 [IU] | Freq: Three times a day (TID) | SUBCUTANEOUS | Status: DC
  Administered 2016-02-04 (×2): 2 [IU] via SUBCUTANEOUS
  Administered 2016-02-06: 3 [IU] via SUBCUTANEOUS
  Administered 2016-02-07: 1 [IU] via SUBCUTANEOUS
  Administered 2016-02-10: 2 [IU] via SUBCUTANEOUS
  Administered 2016-02-11 – 2016-02-12 (×4): 1 [IU] via SUBCUTANEOUS

## 2016-02-03 MED ORDER — ALBUTEROL SULFATE (2.5 MG/3ML) 0.083% IN NEBU
2.5000 mg | INHALATION_SOLUTION | RESPIRATORY_TRACT | Status: DC
  Administered 2016-02-03 (×2): 2.5 mg via RESPIRATORY_TRACT
  Filled 2016-02-03 (×2): qty 3

## 2016-02-03 MED ORDER — TORSEMIDE 20 MG PO TABS
100.0000 mg | ORAL_TABLET | Freq: Every day | ORAL | Status: DC
  Administered 2016-02-04: 100 mg via ORAL
  Filled 2016-02-03: qty 5

## 2016-02-03 MED ORDER — ACETAMINOPHEN 325 MG PO TABS
650.0000 mg | ORAL_TABLET | Freq: Four times a day (QID) | ORAL | Status: DC | PRN
  Administered 2016-02-07 – 2016-02-12 (×2): 650 mg via ORAL
  Filled 2016-02-03 (×2): qty 2

## 2016-02-03 MED ORDER — ACETAMINOPHEN 650 MG RE SUPP: 650.0000 mg | Freq: Four times a day (QID) | RECTAL | Status: DC | PRN

## 2016-02-03 MED ORDER — ISOSORBIDE MONONITRATE ER 60 MG PO TB24
60.0000 mg | ORAL_TABLET | Freq: Every day | ORAL | Status: DC
Start: 2016-02-03 — End: 2016-02-11
  Administered 2016-02-03 – 2016-02-10 (×8): 60 mg via ORAL
  Filled 2016-02-03 (×8): qty 1

## 2016-02-03 MED ORDER — ONDANSETRON HCL 4 MG PO TABS: 4.0000 mg | ORAL_TABLET | Freq: Four times a day (QID) | ORAL | Status: DC | PRN

## 2016-02-03 NOTE — ED Notes (Signed)
POCT CBG resulted 191

## 2016-02-03 NOTE — Progress Notes (Signed)
PATIENT DETAILS Name: Frank Davidson Age: 65 y.o. Sex: male Date of Birth: 11/23/1950 Admit Date: 02/02/2016 Admitting Physician No admitting provider for patient encounter. ZOX:[id number], MD  Subjective: Feels much better.  Assessment/Plan: Principal Problem: Acute respiratory failure with hypoxia: Multifactorial from COPD exacerbation and acute systolic heart failure. Much improved after bronchodilators, diuretics-back on room air.  Active Problems: Acute systolic heart failure: CHF team consulted, continue diuretics, Coreg- follow intake/output, daily weights. Not a candidate for ACEI/ARB given his advanced CKD  Acute on chronic renal failure stage IV: ARF likely secondary to cardiorenal syndrome. Follow renal function closely was on diuretics. If continues to worsen, will consult nephrology.  COPD exacerbation: Rapidly improved, lungs clear this morning-continue doxycycline, bronchodilators-continue IV Solu-Medrol-suspect needs only a few days of steroids and Doxy.  Chronic atrial fibrillation: Continue Eliquis, on Coreg for rate control.  Type 2 diabetes: CBGs relatively stable with glipizide and SSI. Follow and adjust accordingly.  Hypertension: Controlled-continue Coreg, hydralazine and Imdur.  OSA: Continue CPAP.  Gout: No evidence of acute gouty arthritis-continue allopurinol.  Disposition: Remain inpatient  Antimicrobial agents  See below  Anti-infectives    Start     Dose/Rate Route Frequency Ordered Stop   02/03/16 1000  doxycycline (VIBRAMYCIN) 100 mg in dextrose 5 % 250 mL IVPB     100 mg 125 mL/hr over 120 Minutes Intravenous Every 12 hours 02/03/16 0813        DVT Prophylaxis: Eliquis  Code Status: Full code  Family Communication None at bedside  Procedures: None  CONSULTS:  cardiology  Time spent 30 minutes-Greater than 50% of this time was spent in counseling, explanation of diagnosis, planning of further  management, and coordination of care.  MEDICATIONS: Scheduled Meds: . albuterol  2.5 mg Nebulization Q4H  . allopurinol  100 mg Oral Daily  . apixaban  5 mg Oral BID  . budesonide (PULMICORT) nebulizer solution  0.25 mg Nebulization BID  . carvedilol  12.5 mg Oral BID WC  . docusate sodium  100 mg Oral BID  . furosemide  80 mg Intravenous Once  . glipiZIDE  5 mg Oral BID AC  . hydrALAZINE  25 mg Oral TID  . insulin aspart  0-9 Units Subcutaneous TID WC  . ipratropium  0.5 mg Nebulization Q4H  . isosorbide mononitrate  60 mg Oral Daily  . methylPREDNISolone (SOLU-MEDROL) injection  40 mg Intravenous Daily  . [START ON 02/05/2016] metolazone  2.5 mg Oral Once per day on Mon Thu  . omega-3 acid ethyl esters  1 g Oral Daily  . potassium chloride SA  40 mEq Oral BID   Continuous Infusions: . doxycycline (VIBRAMYCIN) IV 100 mg (02/03/16 1018)   PRN Meds:.acetaminophen **OR** acetaminophen, albuterol, baclofen, ondansetron **OR** ondansetron (ZOFRAN) IV, oxyCODONE-acetaminophen **AND** oxyCODONE    PHYSICAL EXAM: Vital signs in last 24 hours: Filed Vitals:   02/03/16 1007 02/03/16 1207 02/03/16 1229 02/03/16 1431  BP: 104/82 104/83  158/100  Pulse:  90  70  Temp:      TempSrc:      Resp:  22  13  Weight:      SpO2:  100% 100% 98%    Weight change:  Filed Weights   02/03/16 0257  Weight: 100.835 kg (222 lb 4.8 oz)   Body mass index is 31.9 kg/(m^2).   Gen Exam: Awake and alert with clear speech.  Neck: Supple, No JVD.  Chest: B/L Clear.   CVS: S1 S2 Regular, no murmurs.  Abdomen: soft, BS +, non tender, non distended.  Extremities: no edema, lower extremities warm to touch. Neurologic: Non Focal.   Skin: No Rash.   Wounds: N/A.   Intake/Output from previous day:  Intake/Output Summary (Last 24 hours) at 02/03/16 1444 Last data filed at 02/03/16 0555  Gross per 24 hour  Intake      0 ml  Output    475 ml  Net   -475 ml     LAB RESULTS: CBC  Recent  Labs Lab 02/03/16 0115 02/03/16 0914  WBC 10.2 8.1  HGB 11.8* 12.4*  HCT 38.0* 39.8  PLT 207 186  MCV 86.8 87.9  MCH 26.9 27.4  MCHC 31.1 31.2  RDW 17.9* 17.8*  LYMPHSABS 1.6 0.7  MONOABS 1.2* 0.2  EOSABS 0.3 0.0  BASOSABS 0.0 0.0    Chemistries   Recent Labs Lab 02/03/16 0115 02/03/16 0914  NA 137 139  K 5.6* 3.0*  CL 90* 89*  CO2 30 33*  GLUCOSE 96 154*  BUN 124* 121*  CREATININE 3.39* 3.32*  CALCIUM 9.8 10.6*    CBG:  Recent Labs Lab 02/03/16 1005 02/03/16 1205  GLUCAP 216* 191*    GFR Estimated Creatinine Clearance: 26.4 mL/min (by C-G formula based on Cr of 3.32).  Coagulation profile No results for input(s): INR, PROTIME in the last 168 hours.  Cardiac Enzymes  Recent Labs Lab 02/03/16 0914  TROPONINI 0.12*    Invalid input(s): POCBNP No results for input(s): DDIMER in the last 72 hours. No results for input(s): HGBA1C in the last 72 hours. No results for input(s): CHOL, HDL, LDLCALC, TRIG, CHOLHDL, LDLDIRECT in the last 72 hours. No results for input(s): TSH, T4TOTAL, T3FREE, THYROIDAB in the last 72 hours.  Invalid input(s): FREET3 No results for input(s): VITAMINB12, FOLATE, FERRITIN, TIBC, IRON, RETICCTPCT in the last 72 hours. No results for input(s): LIPASE, AMYLASE in the last 72 hours.  Urine Studies No results for input(s): UHGB, CRYS in the last 72 hours.  Invalid input(s): UACOL, UAPR, USPG, UPH, UTP, UGL, UKET, UBIL, UNIT, UROB, ULEU, UEPI, UWBC, URBC, UBAC, CAST, UCOM, BILUA  MICROBIOLOGY: No results found for this or any previous visit (from the past 240 hour(s)).  RADIOLOGY STUDIES/RESULTS: Dg Chest 2 View  01/13/2016  CLINICAL DATA:  Shortness of breath worse with exertion and 10 pound weight gain in past 2 days, history CHF, COPD, hypertension, atrial fibrillation, asthma, type II diabetes mellitus EXAM: CHEST  2 VIEW COMPARISON:  12/11/2015 FINDINGS: Enlargement of cardiac silhouette with pulmonary vascular  congestion. Linear scarring versus chronic subsegmental atelectasis in the mid lungs bilaterally, stable. No acute pulmonary edema or segmental consolidation. No pleural effusion or pneumothorax. Scattered degenerative changes thoracic spine. IMPRESSION: Enlargement of cardiac silhouette with pulmonary vascular congestion. Persistent atelectasis versus scarring in mid lungs bilaterally without acute infiltrate. Electronically Signed   By: Ulyses Southward M.D.   On: 01/13/2016 16:43   Dg Chest Port 1 View  02/02/2016  CLINICAL DATA:  Shortness of breath EXAM: PORTABLE CHEST 1 VIEW COMPARISON:  01/13/2016 FINDINGS: Chronic cardiomegaly. Stable mediastinal contours. Chronic bilateral lung scarring. There is no edema, consolidation, effusion, or pneumothorax. IMPRESSION: 1. No acute finding. 2. Chronic cardiomegaly and lung scarring. Electronically Signed   By: Marnee Spring M.D.   On: 02/02/2016 23:58    Jeoffrey Massed, MD  [facility] Pager:[phone]  If 7PM-7AM, please contact night-coverage [email or web address] Password [facility] 02/03/2016, 2:44  PM   LOS: 0 days

## 2016-02-03 NOTE — Consult Note (Addendum)
Advanced Heart Failure Team Consult Note  Referring Physician: Dr Mora Bellman (ED) Primary Physician: Dr Patsy Lager Primary Nephrologist: Dr Briant Cedar Primary Cardiologist:  Dr. Gala Romney   Reason for Consultation: Acute on chronic   HPI:    Frank Davidson is a 65 y.o. male with a hx of obesity, PAF, HTN, COPD, HTN, gout, DM 2, CVA and CHF EF 25% due to NICM who presents to [facility] with c/o of worsening SOB and weight gain. Pertinent admission labs include K 3.0 (initial result 5.6 2/2 hemolysis), Creatinine 3.39, BNP 750.3, Troponin flat. CXR with no acute findings, chronic cardiomegaly and lung scarring.   Was recently admitted from 2/21-2/23/17 with volume overload thought to be 2/2 to high salt diet. He diuresed 3 lbs to a discharge weight of 219 lbs. He has a narrow euvolemic window.   He missed HF clinic follow up appointment scheduled for 01/28/16. [facility] HH nurse called office on 01/30/16. Pt had worsening SOB, increased edema and 5 lbs weight gain.  Metolazone had been held earlier in the week.   Had him take on dose of metolazone to see if symptoms improved.   States he has had more weight gain and SOB over the past few days.  Reports he has been taking all medicines as directed.  Has been peeing on current diuretic regimen. Urine not darkening. Did notice a difference when he was regularly taking metolazone. Denies nephrotoxic agents such as NSAIDs at home.  Did have a bout of diarrhea several days ago. Has had increased SOB with productive cough over past several days. Says weight at home was up to 224 from baseline of 219. Afebrile.   Review of Systems: [y] = yes, [ ]  = no   General: Weight gain [y]; Weight loss [ ] ; Anorexia [ ] ; Fatigue [ ] ; Fever [ ] ; Chills [ ] ; Weakness [ ]   Cardiac: Chest pain/pressure [ ] ; Resting SOB [ ] ; Exertional SOB [y]; Orthopnea [ ] ; Pedal Edema [y]; Palpitations [ ] ; Syncope [ ] ; Presyncope [ ] ; Paroxysmal nocturnal dyspnea[ ]   Pulmonary: Cough [ ] ; Wheezing[ ] ;  Hemoptysis[ ] ; Sputum [ ] ; Snoring [ ]   GI: Vomiting[ ] ; Dysphagia[ ] ; Melena[ ] ; Hematochezia [ ] ; Heartburn[ ] ; Abdominal pain [ ] ; Constipation [ ] ; Diarrhea [ ] ; BRBPR [ ]   GU: Hematuria[ ] ; Dysuria [ ] ; Nocturia[ ]   Vascular: Pain in legs with walking [ ] ; Pain in feet with lying flat [ ] ; Non-healing sores [ ] ; Stroke [ ] ; TIA [ ] ; Slurred speech [ ] ;  Neuro: Headaches[ ] ; Vertigo[ ] ; Seizures[ ] ; Paresthesias[ ] ;Blurred vision [ ] ; Diplopia [ ] ; Vision changes [ ]   Ortho/Skin: Arthritis [y]; Joint pain [y]; Muscle pain [ ] ; Joint swelling [ ] ; Back Pain [ ] ; Rash [ ]   Psych: Depression[ ] ; Anxiety[ ]   Heme: Bleeding problems [ ] ; Clotting disorders [ ] ; Anemia [ ]   Endocrine: Diabetes [ ] ; Thyroid dysfunction[ ]   Home Medications Prior to Admission medications   Medication Sig Start Date End Date Taking? Authorizing Provider  albuterol (PROVENTIL HFA;VENTOLIN HFA) 108 (90 BASE) MCG/ACT inhaler Inhale 2 puffs into the lungs every 6 (six) hours as needed for wheezing or shortness of breath. 06/30/15  Yes Gwenlyn Found Copland, MD  albuterol (PROVENTIL) (2.5 MG/3ML) 0.083% nebulizer solution Take 3 mLs (2.5 mg total) by nebulization every 4 (four) hours as needed for wheezing or shortness of breath. 11/26/15  Yes Carmelina Dane, MD  allopurinol (ZYLOPRIM) 100 MG tablet TAKE 1 TABLET (100  MG TOTAL) BY MOUTH DAILY. 10/30/15  Yes Gwenlyn Found Copland, MD  apixaban (ELIQUIS) 5 MG TABS tablet Take 1 tablet (5 mg total) by mouth 2 (two) times daily. 02/04/15  Yes Dolores Patty, MD  baclofen (LIORESAL) 10 MG tablet TAKE 1 TABLET EVERY 8 HOURS AS NEEDED FOR MUSCLE SPASM(S) 01/12/16  Yes Wallis Bamberg, PA-C  carvedilol (COREG) 12.5 MG tablet Take 12.5 mg by mouth 2 (two) times daily with a meal.   Yes Historical Provider, MD  diclofenac (FLECTOR) 1.3 % PTCH Place 1 patch onto the skin 2 (two) times daily as needed (pain). 11/11/15  Yes Gwenlyn Found Copland, MD  docusate sodium 100 MG CAPS Take 100 mg by mouth 2  (two) times daily. 09/16/14  Yes Matthew Babish, PA-C  glipiZIDE (GLUCOTROL) 5 MG tablet Take 5 mg by mouth 2 (two) times daily before a meal.   Yes Historical Provider, MD  hydrALAZINE (APRESOLINE) 50 MG tablet Take 1 tablet (50 mg total) by mouth 3 (three) times daily. 09/15/15  Yes Laurey Morale, MD  ipratropium-albuterol (DUONEB) 0.5-2.5 (3) MG/3ML SOLN Take 3 mLs by nebulization 4 (four) times daily. 12/23/15  Yes Michele Mcalpine, MD  isosorbide mononitrate (IMDUR) 60 MG 24 hr tablet Take 60 mg by mouth daily.   Yes Historical Provider, MD  metolazone (ZAROXOLYN) 2.5 MG tablet Take 1 tablet (2.5 mg total) by mouth 2 (two) times a week. Tuesday and Friday 09/29/15  Yes Dolores Patty, MD  Multiple Vitamins-Minerals (MULTIVITAMIN WITH MINERALS) tablet Take 1 tablet by mouth daily.   Yes Historical Provider, MD  omega-3 acid ethyl esters (LOVAZA) 1 G capsule Take 1 g by mouth daily.   Yes Historical Provider, MD  oxyCODONE-acetaminophen (PERCOCET) 10-325 MG tablet Take 1 tablet by mouth every 8 (eight) hours as needed for pain. Use with stool softerner. 01/13/16  Yes Sarah Harvie Bridge, PA-C  polyethylene glycol powder (GLYCOLAX/MIRALAX) powder MIX 1 CAPFUL (17GM)  IN  LIQUID  AND  DRINK EVERY DAY AS DIRECTED 07/31/15  Yes Gwenlyn Found Copland, MD  potassium chloride SA (K-DUR,KLOR-CON) 20 MEQ tablet Take 2 tablets (40 mEq total) by mouth 2 (two) times daily. Take 2 tabs in AM and 1 tab in PM 01/15/16  Yes Amy D Clegg, NP  pregabalin (LYRICA) 75 MG capsule Take 75 mg by mouth 2 (two) times daily.   Yes Historical Provider, MD  tiotropium (SPIRIVA HANDIHALER) 18 MCG inhalation capsule Place 1 capsule (18 mcg total) into inhaler and inhale daily. 09/29/15  Yes Michele Mcalpine, MD  torsemide (DEMADEX) 20 MG tablet TAKE 5  TABLETS (100 MG) IN AM AND 4 TABLETS IN PM. 01/15/16  Yes Amy Georgie Chard, NP    Past Medical History: Past Medical History  Diagnosis Date  . Atrial fibrillation (HCC)     PAF  . Hypertension    . COPD (chronic obstructive pulmonary disease) (HCC)   . Ocular herpes   . Gout   . CHF (congestive heart failure) (HCC)   . Chronic pain   . Hyperlipidemia   . Nonischemic cardiomyopathy (HCC) Aug 2015    EF 15%  . Ocular herpes   . Neuropathy (HCC)   . Left-sided weakness     "because of the arthritis and edema"  . PVD (peripheral vascular disease) [facility]) May 2015    abnormal ABIs  . Memory impairment     "short term"  . CKD (chronic kidney disease), stage III     Dr. Briant Cedar  follows-holding   . Fibromyalgia     neuropathy- hands, more than feet.  . Sleep apnea with use of continuous positive airway pressure (CPAP)     does not use cpap  . GERD (gastroesophageal reflux disease)     tums as needed  . Asthma   . Type II diabetes mellitus (HCC)   . Stroke [facility]) X 5    last stroke aug 2014"; denies residual on 09/12/2014  . Osteoarthritis   . Arthritis     "knees, right elbow, shoulder" (09/12/2014)  . Shortness of breath dyspnea     Past Surgical History: Past Surgical History  Procedure Laterality Date  . 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 Tonsillectomy  age 80  . Esophagogastroduodenoscopy N/A 12/24/2013    Procedure: ESOPHAGOGASTRODUODENOSCOPY (EGD);  Surgeon: Louis Meckel, MD;  Location: [facility];  Service: Endoscopy;  Laterality: N/A;  . Colonoscopy N/A 12/24/2013    Procedure: COLONOSCOPY;  Surgeon: Louis Meckel, MD;  Location: [facility] ENDOSCOPY;  Service: Endoscopy;  Laterality: N/A;  . Joint replacement    . Cardiac catheterization  10/2006    normal coronary arteries;   Marland Kitchen Cataract extraction w/ intraocular lens  implant, bilateral Bilateral ~ 2008  . Total hip arthroplasty Left 09/12/2014    Procedure: LEFT TOTAL HIP ARTHROPLASTY ANTERIOR APPROACH;  Surgeon: [name], MD;  Location: [facility] OR;  Service: Orthopedics;  Laterality: Left;  . Right heart catheterization N/A 08/13/2014    Rt ht cath prior to hip surgery    Family History: Family History   Problem Relation Age of Onset  . Cancer Mother   . Hypertension Mother   . Lung disease Mother   . Diabetes Father   . Diabetes Sister   . Hypertension Sister   . Hypertension Brother   . Hypertension Sister   . Colon cancer Neg Hx     Social History: Social History   Social History  . Marital Status: Divorced    Spouse Name: N/A  . Number of Children: N/A  . Years of Education: N/A   Social History Main Topics  . Smoking status: Former Smoker -- 0.50 packs/day for 13 years    Types: Cigarettes    Quit date: 12/18/2006  . Smokeless tobacco: Never Used  . Alcohol Use: No     Comment: Quit 2008  . Drug Use: No     Comment: last used marijuana and cocaine 2008  . Sexual Activity: No   Other Topics Concern  . None   Social History Narrative    Allergies:  Allergies  Allergen Reactions  . Corlanor [Ivabradine] Palpitations and Other (See Comments)    Kidney and heart problems, syncope  . Entresto [Sacubitril-Valsartan]     Syncope  . Januvia [Sitagliptin] Other (See Comments)    "Almost killed me"   . Lunesta [Eszopiclone] Palpitations and Other (See Comments)    Kidney and heart problems, syncope  . Actos [Pioglitazone] Other (See Comments)    Caused blood in urine and fluid retention     Objective:    Vital Signs:   Temp:  [97.5 F (36.4 C)] 97.5 F (36.4 C) (03/14 0848) Pulse Rate:  [40-89] 89 (03/14 0848) Resp:  [12-28] 18 (03/14 0848) BP: (67-110)/(56-82) 104/82 mmHg (03/14 1007) SpO2:  [86 %-100 %] 98 % (03/14 0939) Weight:  [222 lb 4.8 oz (100.835 kg)] 222 lb 4.8 oz (100.835 kg) (03/14 0257)    Weight change: [employer]  02/03/16 0257  Weight: 222 lb 4.8 oz (100.835 kg)    Intake/Output:   Intake/Output Summary (Last 24 hours) at 02/03/16 1051 Last data filed at 02/03/16 0555  Gross per 24 hour  Intake      0 ml  Output    475 ml  Net   -475 ml     Physical Exam: General:  Obese, pleasant HEENT: normal Neck: supple. JVP  difficult to assess but appears elevated at least 8-9 cm. Carotids 2+ bilat; no bruits. No thyromegaly or nodule noted. Cor: PMI nondisplaced. irregularly irregular.  Lungs: Mild basilar crackles, occasional wheeze. Abdomen: Obese, soft, NT, mildly distended, no HSM. No bruits or masses. +BS  Extremities: no cyanosis, clubbing, rash, edema. Legs cool to the touch.  Neuro: alert & orientedx3, cranial nerves grossly intact. moves all 4 extremities w/o difficulty. Affect pleasant  Telemetry: Reviewed, afib  Labs: Basic Metabolic Panel:  Recent Labs Lab 02/03/16 0115 02/03/16 0914  NA 137 139  K 5.6* 3.0*  CL 90* 89*  CO2 30 33*  GLUCOSE 96 154*  BUN 124* 121*  CREATININE 3.39* 3.32*  CALCIUM 9.8 10.6*    Liver Function Tests:  Recent Labs Lab 02/03/16 0914  AST 30  ALT 18  ALKPHOS 66  BILITOT 1.1  PROT 8.4*  ALBUMIN 4.1   No results for input(s): LIPASE, AMYLASE in the last 168 hours. No results for input(s): AMMONIA in the last 168 hours.  CBC:  Recent Labs Lab 02/03/16 0115 02/03/16 0914  WBC 10.2 8.1  NEUTROABS 7.2 7.2  HGB 11.8* 12.4*  HCT 38.0* 39.8  MCV 86.8 87.9  PLT 207 186    Cardiac Enzymes:  Recent Labs Lab 02/03/16 0914  TROPONINI 0.12*    BNP: BNP (last 3 results)  Recent Labs  12/11/15 2345 01/13/16 1544 02/03/16 0115  BNP 1024.0* 843.5* 750.3*    ProBNP (last 3 results) No results for input(s): PROBNP in the last 8760 hours.   CBG:  Recent Labs Lab 02/03/16 1005  GLUCAP 216*    Coagulation Studies: No results for input(s): LABPROT, INR in the last 72 hours.  Other results: EKG: 02/02/16 afib 72  Imaging: Dg Chest Port 1 View  02/02/2016  CLINICAL DATA:  Shortness of breath EXAM: PORTABLE CHEST 1 VIEW COMPARISON:  01/13/2016 FINDINGS: Chronic cardiomegaly. Stable mediastinal contours. Chronic bilateral lung scarring. There is no edema, consolidation, effusion, or pneumothorax. IMPRESSION: 1. No acute finding. 2.  Chronic cardiomegaly and lung scarring. Electronically Signed   By: Marnee Spring M.D.   On: 02/02/2016 23:58      Medications:     Current Medications: . albuterol  2.5 mg Nebulization Q4H  . allopurinol  100 mg Oral Daily  . apixaban  5 mg Oral BID  . budesonide (PULMICORT) nebulizer solution  0.25 mg Nebulization BID  . carvedilol  12.5 mg Oral BID WC  . docusate sodium  100 mg Oral BID  . glipiZIDE  5 mg Oral BID AC  . hydrALAZINE  25 mg Oral TID  . insulin aspart  0-9 Units Subcutaneous TID WC  . ipratropium  0.5 mg Nebulization Q4H  . isosorbide mononitrate  60 mg Oral Daily  . methylPREDNISolone (SOLU-MEDROL) injection  40 mg Intravenous Daily  . [START ON 02/05/2016] metolazone  2.5 mg Oral Once per day on Mon Thu  . omega-3 acid ethyl esters  1 g Oral Daily  . potassium chloride SA  40 mEq Oral BID  . torsemide  100  mg Oral Daily  . torsemide  80 mg Oral QHS     Infusions: . doxycycline (VIBRAMYCIN) IV 100 mg (02/03/16 1018)      Assessment   1. Acute respiratory failure with hypoxia 2. Acute on chronic systolic CHF 3. 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 Chronic Afib 5. HTN 6. DM2 7. OSA - NOT on CPAP.   Plan    He is mildly volume overloaded and has a narrow euvolemic window.  Also being treated for possible COPD exacerbation with doxy.   Just got morning dose of torsemide.  Will give 80 mg IV lasix this pm and see how he responds. Will get repeat echo with last being 08/2015 and 4 admissions x 6 months.   Had previously discussed vein mapping for eventual HD.  If creatinine continues to worsen may need to consider having renal see this admission.   He states he has had 4 sleep studies but has to return for further titration as therapeutic 'dosing' not identified during split night study 11/11/15. He has thus far refused to return for an additional full night study.  Stressed importance of CPAP to him.   Length of Stay: 0  Graciella Freer  PA-C 02/03/2016, 10:51 AM  Advanced Heart Failure Team Pager [phone] (M-F; 7a - 4p)  Please contact [facility] for night-coverage after hours (4p -7a ) and weekends on [email or web address]  Patient seen and examined with Frank Saber, PA-C. We discussed all aspects of the encounter. I agree with the assessment and plan as stated above.   Volume status looks good after IV lasix. Would resume po torsemide. Follow renal function closely. We will follow.   Frank Davidson, Daniel,MD 5:11 PM

## 2016-02-03 NOTE — ED Notes (Signed)
Meal tray ordered 

## 2016-02-03 NOTE — ED Notes (Signed)
Admitting MD at bedside.

## 2016-02-03 NOTE — ED Notes (Signed)
Updated pt's sister per pt's permission, on his condition. Pt finishing continuous neb treatment.

## 2016-02-03 NOTE — Progress Notes (Signed)
[facility]  Patient Status: Active (receiving services up to time of hospitalization)  [facility] is providing the following services: RN  If patient discharges after hours, please call [phone].   Frank Davidson 02/03/2016, 4:14 PM

## 2016-02-03 NOTE — Care Management Note (Signed)
Case Management Note  Patient Details  Name: Frank Davidson MRN: [id number] Date of Birth: [birth year]  Subjective/Objective:     Pt is active with [facility] for home health nursing, they will resume services on discharge.                           Expected Discharge Plan:  Home w Home Health Services  Discharge planning Services  CM Consult  Post Acute Care Choice:  Resumption of Svcs/PTA Provider  HH Arranged:  RN, Disease Management HH Agency:  [facility]  Status of Service:  In process, will continue to follow  Magdalene River, RN 02/03/2016, 4:02 PM

## 2016-02-03 NOTE — H&P (Addendum)
[facility] History and Physical  Frank Davidson HMC:[id number] DOB: [birth year] DOA: 02/02/2016  Referring physician: Dr. Mora Bellman. PCP: Abbe Amsterdam, MD  Specialists: Dr. Milas Kocher. Cardiologist.  Chief Complaint: Shortness of breath.  HPI: Frank Davidson is a 65 y.o. male with history of nonischemic cardiomyopathy last EF measured as 25%, COPD, OSA, obesity, diabetes mellitus, paroxysmal atrial fibrillation presents to the ER because of increasing shortness of breath. Patient states over the last 2-3 days patient has been having increasing shortness of breath productive cough and wheezing. Patient's symptoms increases on exertion. Patient states he also has gained 3 pounds from his baseline weight of around 219 pounds. Chest x-ray does not show anything acute and on exam in the ER patient was initially wheezing. Patient was given nebulizer treatment and still patient was mildly hypoxic and has been admitted for further management. Patient's labs it does show that patient's creatinine has worsened from baseline. Denies any chest pain fever chills nausea vomiting. Patient did have some diarrhea 3 days ago.   Review of Systems: As presented in the history of presenting illness, rest negative.  Past Medical History  Diagnosis Date  . Atrial fibrillation (HCC)     PAF  . Hypertension   . COPD (chronic obstructive pulmonary disease) (HCC)   . Ocular herpes   . Gout   . CHF (congestive heart failure) (HCC)   . Chronic pain   . Hyperlipidemia   . Nonischemic cardiomyopathy (HCC) Aug 2015    EF 15%  . Ocular herpes   . Neuropathy (HCC)   . Left-sided weakness     "because of the arthritis and edema"  . PVD (peripheral vascular disease) [facility]) May 2015    abnormal ABIs  . Memory impairment     "short term"  . CKD (chronic kidney disease), stage III     Dr. Briant Cedar follows-holding   . Fibromyalgia     neuropathy- hands, more than feet.  . Sleep apnea with use of continuous  positive airway pressure (CPAP)     does not use cpap  . GERD (gastroesophageal reflux disease)     tums as needed  . Asthma   . Type II diabetes mellitus (HCC)   . Stroke [facility]) X 5    last stroke aug 2014"; denies residual on 09/12/2014  . Osteoarthritis   . Arthritis     "knees, right elbow, shoulder" (09/12/2014)  . Shortness of breath dyspnea    Past Surgical History  Procedure Laterality Date  . 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 Tonsillectomy  age 45  . Esophagogastroduodenoscopy N/A 12/24/2013    Procedure: ESOPHAGOGASTRODUODENOSCOPY (EGD);  Surgeon: Louis Meckel, MD;  Location: [facility];  Service: Endoscopy;  Laterality: N/A;  . Colonoscopy N/A 12/24/2013    Procedure: COLONOSCOPY;  Surgeon: Louis Meckel, MD;  Location: [facility] ENDOSCOPY;  Service: Endoscopy;  Laterality: N/A;  . Joint replacement    . Cardiac catheterization  10/2006    normal coronary arteries;   Marland Kitchen Cataract extraction w/ intraocular lens  implant, bilateral Bilateral ~ 2008  . Total hip arthroplasty Left 09/12/2014    Procedure: LEFT TOTAL HIP ARTHROPLASTY ANTERIOR APPROACH;  Surgeon: [name], MD;  Location: [facility] OR;  Service: Orthopedics;  Laterality: Left;  . Right heart catheterization N/A 08/13/2014    Rt ht cath prior to hip surgery   Social History:  reports that he quit smoking about 9 years ago. His smoking use included Cigarettes. He  has a 6.5 pack-year smoking history. He has never used smokeless tobacco. He reports that he does not drink alcohol or use illicit drugs. Where does patient live Home. Can patient participate in ADLs? Yes.  Allergies  Allergen Reactions  . Corlanor [Ivabradine] Palpitations and Other (See Comments)    Kidney and heart problems, syncope  . Entresto [Sacubitril-Valsartan]     Syncope  . Januvia [Sitagliptin] Other (See Comments)    "Almost killed me"   . Lunesta [Eszopiclone] Palpitations and Other (See Comments)    Kidney and heart problems, syncope  .  Actos [Pioglitazone] Other (See Comments)    Caused blood in urine and fluid retention     Family History:  Family History  Problem Relation Age of Onset  . Cancer Mother   . Hypertension Mother   . Lung disease Mother   . Diabetes Father   . Diabetes Sister   . Hypertension Sister   . Hypertension Brother   . Hypertension Sister   . Colon cancer Neg Hx       Prior to Admission medications   Medication Sig Start Date End Date Taking? Authorizing Provider  albuterol (PROVENTIL HFA;VENTOLIN HFA) 108 (90 BASE) MCG/ACT inhaler Inhale 2 puffs into the lungs every 6 (six) hours as needed for wheezing or shortness of breath. 06/30/15   Gwenlyn Found Copland, MD  albuterol (PROVENTIL) (2.5 MG/3ML) 0.083% nebulizer solution Take 3 mLs (2.5 mg total) by nebulization every 4 (four) hours as needed for wheezing or shortness of breath. 11/26/15   Carmelina Dane, MD  allopurinol (ZYLOPRIM) 100 MG tablet TAKE 1 TABLET (100 MG TOTAL) BY MOUTH DAILY. 10/30/15   Pearline Cables, MD  apixaban (ELIQUIS) 5 MG TABS tablet Take 1 tablet (5 mg total) by mouth 2 (two) times daily. 02/04/15   Dolores Patty, MD  baclofen (LIORESAL) 10 MG tablet TAKE 1 TABLET EVERY 8 HOURS AS NEEDED FOR MUSCLE SPASM(S) 01/12/16   Wallis Bamberg, PA-C  carvedilol (COREG) 25 MG tablet Take 12.5 mg by mouth 2 (two) times daily with a meal.    Historical Provider, MD  diclofenac (FLECTOR) 1.3 % PTCH Place 1 patch onto the skin 2 (two) times daily as needed (pain). 11/11/15   Gwenlyn Found Copland, MD  docusate sodium 100 MG CAPS Take 100 mg by mouth 2 (two) times daily. 09/16/14   Lanney Gins, PA-C  glipiZIDE (GLUCOTROL) 5 MG tablet Take 5 mg by mouth 2 (two) times daily before a meal.    Historical Provider, MD  hydrALAZINE (APRESOLINE) 50 MG tablet Take 1 tablet (50 mg total) by mouth 3 (three) times daily. 09/15/15   Laurey Morale, MD  ipratropium-albuterol (DUONEB) 0.5-2.5 (3) MG/3ML SOLN Take 3 mLs by nebulization 4 (four) times  daily. 12/23/15   Michele Mcalpine, MD  isosorbide mononitrate (IMDUR) 60 MG 24 hr tablet Take 60 mg by mouth daily.    Historical Provider, MD  metolazone (ZAROXOLYN) 2.5 MG tablet Take 1 tablet (2.5 mg total) by mouth 2 (two) times a week. Tuesday and Friday 09/29/15   Dolores Patty, MD  Multiple Vitamins-Minerals (MULTIVITAMIN WITH MINERALS) tablet Take 1 tablet by mouth daily.    Historical Provider, MD  omega-3 acid ethyl esters (LOVAZA) 1 G capsule Take 1 g by mouth daily.    Historical Provider, MD  oxyCODONE-acetaminophen (PERCOCET) 10-325 MG tablet Take 1 tablet by mouth every 8 (eight) hours as needed for pain. Use with stool softerner. 01/13/16   Maralyn Sago  L Weber, PA-C  polyethylene glycol powder (GLYCOLAX/MIRALAX) powder MIX 1 CAPFUL (17GM)  IN  LIQUID  AND  DRINK EVERY DAY AS DIRECTED 07/31/15   Gwenlyn Found Copland, MD  potassium chloride SA (K-DUR,KLOR-CON) 20 MEQ tablet Take 2 tablets (40 mEq total) by mouth 2 (two) times daily. Take 2 tabs in AM and 1 tab in PM 01/15/16   Amy D Clegg, NP  tiotropium (SPIRIVA HANDIHALER) 18 MCG inhalation capsule Place 1 capsule (18 mcg total) into inhaler and inhale daily. 09/29/15   Michele Mcalpine, MD  torsemide (DEMADEX) 20 MG tablet TAKE 5  TABLETS (100 MG) IN AM AND 4 TABLETS IN PM. 01/15/16   Amy D Filbert Schilder, NP    Physical Exam: Filed Vitals:   02/03/16 0200 02/03/16 0257 02/03/16 0320 02/03/16 0400  BP: 100/78   100/72  Pulse: 67  67 75  Temp:      TempSrc:      Resp: Weight:  222 lb 4.8 oz (100.835 kg)    SpO2: 100%  86% 99%     General:  Moderately built and nourished.  Eyes: Anicteric no pallor.  ENT: No discharge from the ears eyes nose and mouth.  Neck: No mass felt. JVD mildly elevated.  Cardiovascular: S1 and S2 heard.  Respiratory: Mild expiratory wheeze heard no crepitations.  Abdomen: Soft nontender bowel sounds present.  Skin: No rash.  Musculoskeletal: No edema.  Psychiatric: Appears normal.  Neurologic:  Alert awake oriented to time place and person. Moves all extremities.  Labs on Admission:  Basic Metabolic Panel:  Recent Labs Lab 02/03/16 0115  NA 137  K 5.6*  CL 90*  CO2 30  GLUCOSE 96  BUN 124*  CREATININE 3.39*  CALCIUM 9.8   Liver Function Tests: No results for input(s): AST, ALT, ALKPHOS, BILITOT, PROT, ALBUMIN in the last 168 hours. No results for input(s): LIPASE, AMYLASE in the last 168 hours. No results for input(s): AMMONIA in the last 168 hours. CBC:  Recent Labs Lab 02/03/16 0115  WBC 10.2  NEUTROABS 7.2  HGB 11.8*  HCT 38.0*  MCV 86.8  PLT 207   Cardiac Enzymes: No results for input(s): CKTOTAL, CKMB, CKMBINDEX, TROPONINI in the last 168 hours.  BNP (last 3 results)  Recent Labs  12/11/15 2345 01/13/16 1544 02/03/16 0115  BNP 1024.0* 843.5* 750.3*    ProBNP (last 3 results) No results for input(s): PROBNP in the last 8760 hours.  CBG: No results for input(s): GLUCAP in the last 168 hours.  Radiological Exams on Admission: Dg Chest Port 1 View  02/02/2016  CLINICAL DATA:  Shortness of breath EXAM: PORTABLE CHEST 1 VIEW COMPARISON:  01/13/2016 FINDINGS: Chronic cardiomegaly. Stable mediastinal contours. Chronic bilateral lung scarring. There is no edema, consolidation, effusion, or pneumothorax. IMPRESSION: 1. No acute finding. 2. Chronic cardiomegaly and lung scarring. Electronically Signed   By: Marnee Spring M.D.   On: 02/02/2016 23:58    EKG: Independently reviewed. A. fib rate controlled.  Assessment/Plan Principal Problem:   Acute respiratory failure with hypoxia (HCC) Active Problems:   PAF (paroxysmal atrial fibrillation) (HCC)   Acute on chronic systolic CHF (congestive heart failure) (HCC)   COPD exacerbation (HCC)   Renal failure (ARF), acute on chronic (HCC)   1. Acute respiratory failure with hypoxia - could be from COPD exacerbation versus CHF. Patient did have some wheezing on admission. Patient also has gained 3  pounds from his baseline. For now patient will be  continued on nebulizer treatment Pulmicort steroids and doxycycline for COPD exacerbation. Patient is on Demadex 100 mg in the morning and 80 mg in the evening which will be continued but cautioned that patient's creatinine has worsened so there's any further worsening may have to hold diuretics. Cardiology consulted. 2. Nonischemic cardiomyopathy last EF measured was 25% - see #1. Patient is not on ACE inhibitor is secondary to renal failure. 3. Acute on chronic renal failure stage IV - creatinine has worsened from 2.2-3.3 with mild hyperkalemia. If there is any further worsening patient's diuretics may have to be held. Check urine studies FeNa. 4. Chronic atrial fibrillation - chads 2 vasc score is more than 2. Patient is on Apixaban. Patient has worsening renal function so oral anticoagulants may be due to be changed if there is further worsening of renal function. 5. Hypertension  - continue home medications. 6. Diabetes mellitus type 2 - continue home medications with sliding scale coverage. 7. Anemia follow CBC. 8. OSA on CPAP.   DVT Prophylaxis Apixaban.  Code Status: Full Code.  Family Communication: Discussed with patient.  Disposition Plan: Admit to inpatient.    Jered Heiny N. [facility] Pager [phone].  If 7PM-7AM, please contact night-coverage [email or web address] Password [facility] 02/03/2016, 6:54 AM

## 2016-02-03 NOTE — ED Notes (Signed)
Pt attempted to use urinal without success.

## 2016-02-04 ENCOUNTER — Inpatient Hospital Stay (HOSPITAL_COMMUNITY)
Admission: RE | Admit: 2016-02-04 | Discharge: 2016-02-04 | Disposition: A | Discharge status: Home / Self Care | Payer: Commercial Managed Care - HMO | Source: Ambulatory Visit | Attending: Surgery | Admitting: Surgery

## 2016-02-04 ENCOUNTER — Inpatient Hospital Stay (HOSPITAL_COMMUNITY): Payer: Commercial Managed Care - HMO

## 2016-02-04 ENCOUNTER — Other Ambulatory Visit (HOSPITAL_COMMUNITY): Payer: Commercial Managed Care - HMO

## 2016-02-04 ENCOUNTER — Ambulatory Visit: Payer: Commercial Managed Care - HMO | Admitting: Vascular Surgery

## 2016-02-04 DIAGNOSIS — Z01810 Encounter for preprocedural cardiovascular examination: Secondary | ICD-10-CM

## 2016-02-04 DIAGNOSIS — I509 Heart failure, unspecified: Secondary | ICD-10-CM

## 2016-02-04 DIAGNOSIS — I480 Paroxysmal atrial fibrillation: Secondary | ICD-10-CM

## 2016-02-04 DIAGNOSIS — N183 Chronic kidney disease, stage 3 unspecified: Secondary | ICD-10-CM

## 2016-02-04 DIAGNOSIS — N189 Chronic kidney disease, unspecified: Secondary | ICD-10-CM

## 2016-02-04 DIAGNOSIS — N179 Acute kidney failure, unspecified: Secondary | ICD-10-CM

## 2016-02-04 LAB — GLUCOSE, CAPILLARY
GLUCOSE-CAPILLARY: 178 mg/dL — AB (ref 65–99)
Glucose-Capillary: 112 mg/dL — ABNORMAL HIGH (ref 65–99)
Glucose-Capillary: 185 mg/dL — ABNORMAL HIGH (ref 65–99)
Glucose-Capillary: 146 mg/dL — ABNORMAL HIGH (ref 65–99)

## 2016-02-04 LAB — BASIC METABOLIC PANEL
Anion gap: 17 — ABNORMAL HIGH (ref 5–15)
BUN: 127 mg/dL — AB (ref 6–20)
CALCIUM: 9.5 mg/dL (ref 8.9–10.3)
CHLORIDE: 88 mmol/L — AB (ref 101–111)
CO2: 28 mmol/L (ref 22–32)
CREATININE: 3.05 mg/dL — AB (ref 0.61–1.24)
GFR calc non Af Amer: 20 mL/min — ABNORMAL LOW (ref >60)
GFR, EST AFRICAN AMERICAN: 23 mL/min — AB (ref >60)
Glucose, Bld: 189 mg/dL — ABNORMAL HIGH (ref 65–99)
Potassium: 3.4 mmol/L — ABNORMAL LOW (ref 3.5–5.1)
SODIUM: 133 mmol/L — AB (ref 135–145)

## 2016-02-04 LAB — CBC
HCT: 35.7 % — ABNORMAL LOW (ref 39.0–52.0)
Hemoglobin: 11.2 g/dL — ABNORMAL LOW (ref 13.0–17.0)
MCH: 26.9 pg (ref 26.0–34.0)
MCHC: 31.4 g/dL (ref 30.0–36.0)
MCV: 85.8 fL (ref 78.0–100.0)
PLATELETS: 169 10*3/uL (ref 150–400)
RBC: 4.16 MIL/uL — ABNORMAL LOW (ref 4.22–5.81)
RDW: 17.5 % — AB (ref 11.5–15.5)
WBC: 12.8 10*3/uL — AB (ref 4.0–10.5)

## 2016-02-04 LAB — ECHOCARDIOGRAM COMPLETE
HEIGHTINCHES: 70 in
WEIGHTICAEL: 3641.6 [oz_av]

## 2016-02-04 LAB — MAGNESIUM: MAGNESIUM: 2.5 mg/dL — AB (ref 1.7–2.4)

## 2016-02-04 MED ORDER — FUROSEMIDE 10 MG/ML IJ SOLN
80.0000 mg | Freq: Two times a day (BID) | INTRAMUSCULAR | Status: DC
  Administered 2016-02-05: 80 mg via INTRAVENOUS
  Filled 2016-02-04: qty 8

## 2016-02-04 MED ORDER — POTASSIUM CHLORIDE CRYS ER 20 MEQ PO TBCR
40.0000 meq | EXTENDED_RELEASE_TABLET | Freq: Once | ORAL | Status: AC
  Administered 2016-02-04: 40 meq via ORAL
  Filled 2016-02-04: qty 2

## 2016-02-04 MED ORDER — IPRATROPIUM-ALBUTEROL 0.5-2.5 (3) MG/3ML IN SOLN
3.0000 mL | Freq: Four times a day (QID) | RESPIRATORY_TRACT | Status: DC
  Administered 2016-02-05 (×4): 3 mL via RESPIRATORY_TRACT
  Filled 2016-02-04 (×4): qty 3

## 2016-02-04 MED ORDER — FUROSEMIDE 10 MG/ML IJ SOLN
80.0000 mg | Freq: Once | INTRAMUSCULAR | Status: AC
  Administered 2016-02-04: 80 mg via INTRAVENOUS
  Filled 2016-02-04: qty 8

## 2016-02-04 MED ORDER — BUDESONIDE 0.25 MG/2ML IN SUSP
0.2500 mg | Freq: Two times a day (BID) | RESPIRATORY_TRACT | Status: DC
  Administered 2016-02-04 – 2016-02-12 (×15): 0.25 mg via RESPIRATORY_TRACT
  Filled 2016-02-04 (×16): qty 2

## 2016-02-04 MED ORDER — PERFLUTREN LIPID MICROSPHERE
INTRAVENOUS | Status: AC
  Administered 2016-02-04: 1 mL
  Filled 2016-02-04: qty 10

## 2016-02-04 NOTE — Progress Notes (Signed)
Pt assessment complete.  Pt. tx should remain the same he still requires Q4 at this time

## 2016-02-04 NOTE — Progress Notes (Signed)
PATIENT DETAILS Name: DENZELL COLASANTI Age: 65 y.o. Sex: male Date of Birth: January 12, 1951 Admit Date: 02/02/2016 Admitting Physician Eduard Clos, MD NFA:[id number],HQIONGE, MD  Subjective: Didn't urinate much yesterday. Slightly. Sitting up in bed, shortness of breath has not worsened compared to yesterday.  Assessment/Plan: Principal Problem: Acute respiratory failure with hypoxia: I suspect this is all acute systolic heart failure, rather than COPD exacerbation. I will discontinue IV Solu-Medrol and doxycycline. Continue diuretics at the discretion of cardiology.   Active Problems: Acute systolic heart failure: CHF team consulted, continue diuretics, Coreg- follow intake/output, daily weights. Not a candidate for ACEI/ARB given his advanced CKD  Acute on chronic renal failure stage IV: ARF likely secondary to cardiorenal syndrome. Follow renal function closely was on diuretics. If continues to worsen, will need to consult nephrology.  Chronic atrial fibrillation: Continue Eliquis, on Coreg for rate control.  Type 2 diabetes: CBGs relatively stable with glipizide and SSI. Follow and adjust accordingly.  Hypertension: Controlled-continue Coreg, hydralazine and Imdur.  OSA: Continue CPAP.  Gout: No evidence of acute gouty arthritis-continue allopurinol.  Disposition: Remain inpatient-home when cleared by cardiology  Antimicrobial agents  See below  Anti-infectives    Start     Dose/Rate Route Frequency Ordered Stop   02/03/16 1000  doxycycline (VIBRAMYCIN) 100 mg in dextrose 5 % 250 mL IVPB  Status:  Discontinued     100 mg 125 mL/hr over 120 Minutes Intravenous Every 12 hours 02/03/16 0813 02/04/16 1108      DVT Prophylaxis: Eliquis  Code Status: Full code  Family Communication None at bedside  Procedures: None  CONSULTS:  cardiology  Time spent 25 minutes-Greater than 50% of this time was spent in counseling, explanation of  diagnosis, planning of further management, and coordination of care.  MEDICATIONS: Scheduled Meds: . allopurinol  100 mg Oral Daily  . apixaban  5 mg Oral BID  . budesonide (PULMICORT) nebulizer solution  0.25 mg Nebulization BID  . carvedilol  12.5 mg Oral BID WC  . docusate sodium  100 mg Oral BID  . furosemide  80 mg Intravenous Once  . [START ON 02/05/2016] furosemide  80 mg Intravenous BID  . glipiZIDE  5 mg Oral BID AC  . hydrALAZINE  25 mg Oral TID  . insulin aspart  0-9 Units Subcutaneous TID WC  . ipratropium-albuterol  3 mL Nebulization Q4H  . isosorbide mononitrate  60 mg Oral Daily  . [START ON 02/05/2016] metolazone  2.5 mg Oral Once per day on Mon Thu  . omega-3 acid ethyl esters  1 g Oral Daily  . potassium chloride SA  40 mEq Oral BID  . potassium chloride  40 mEq Oral Once   Continuous Infusions:   PRN Meds:.acetaminophen **OR** acetaminophen, albuterol, baclofen, ondansetron **OR** ondansetron (ZOFRAN) IV, oxyCODONE-acetaminophen **AND** oxyCODONE    PHYSICAL EXAM: Vital signs in last 24 hours: Filed Vitals:   02/04/16 0232 02/04/16 0321 02/04/16 0610 02/04/16 0839  BP:   112/84   Pulse:   79   Temp:   97.8 F (36.6 C)   TempSrc:   Oral   Resp:   19   Height:      Weight: 103.239 kg (227 lb 9.6 oz)     SpO2:  99% 100% 95%    Weight change: 1.678 kg (3 lb 11.2 oz) Filed Weights   02/03/16 0257 02/03/16 1511 02/04/16 0232  Weight: 100.835 kg (  222 lb 4.8 oz) 102.513 kg (226 lb) 103.239 kg (227 lb 9.6 oz)   Body mass index is 32.66 kg/(m^2).   Gen Exam: Awake and alert with clear speech.  Neck: Supple, +JVD.   Chest: B/L Clear.   CVS: S1 S2 Regular, no murmurs.  Abdomen: soft, BS +, non tender, non distended.  Extremities: +edema, lower extremities warm to touch. Neurologic: Non Focal.   Skin: No Rash.   Wounds: N/A.   Intake/Output from previous day:  Intake/Output Summary (Last 24 hours) at 02/04/16 1108 Last data filed at 02/04/16 0611   Gross per 24 hour  Intake    970 ml  Output   1100 ml  Net   -130 ml     LAB RESULTS: CBC  Recent Labs Lab 02/03/16 0115 02/03/16 0914 02/04/16 0400  WBC 10.2 8.1 12.8*  HGB 11.8* 12.4* 11.2*  HCT 38.0* 39.8 35.7*  PLT 207 186 169  MCV 86.8 87.9 85.8  MCH 26.9 27.4 26.9  MCHC 31.1 31.2 31.4  RDW 17.9* 17.8* 17.5*  LYMPHSABS 1.6 0.7  --   MONOABS 1.2* 0.2  --   EOSABS 0.3 0.0  --   BASOSABS 0.0 0.0  --     Chemistries   Recent Labs Lab 02/03/16 0115 02/03/16 0914 02/04/16 0400  NA 137 139 133*  K 5.6* 3.0* 3.4*  CL 90* 89* 88*  CO2 30 33* 28  GLUCOSE 96 154* 189*  BUN 124* 121* 127*  CREATININE 3.39* 3.32* 3.05*  CALCIUM 9.8 10.6* 9.5  MG  --   --  2.5*    CBG:  Recent Labs Lab 02/03/16 1005 02/03/16 1205 02/03/16 1626 02/03/16 2052 02/04/16 0608  GLUCAP 216* 191* 91 137* 112*    GFR Estimated Creatinine Clearance: 29.1 mL/min (by C-G formula based on Cr of 3.05).  Coagulation profile No results for input(s): INR, PROTIME in the last 168 hours.  Cardiac Enzymes  Recent Labs Lab 02/03/16 0914  TROPONINI 0.12*    Invalid input(s): POCBNP No results for input(s): DDIMER in the last 72 hours. No results for input(s): HGBA1C in the last 72 hours. No results for input(s): CHOL, HDL, LDLCALC, TRIG, CHOLHDL, LDLDIRECT in the last 72 hours. No results for input(s): TSH, T4TOTAL, T3FREE, THYROIDAB in the last 72 hours.  Invalid input(s): FREET3 No results for input(s): VITAMINB12, FOLATE, FERRITIN, TIBC, IRON, RETICCTPCT in the last 72 hours. No results for input(s): LIPASE, AMYLASE in the last 72 hours.  Urine Studies No results for input(s): UHGB, CRYS in the last 72 hours.  Invalid input(s): UACOL, UAPR, USPG, UPH, UTP, UGL, UKET, UBIL, UNIT, UROB, ULEU, UEPI, UWBC, URBC, UBAC, CAST, UCOM, BILUA  MICROBIOLOGY: No results found for this or any previous visit (from the past 240 hour(s)).  RADIOLOGY STUDIES/RESULTS: Dg Chest 2  View  01/13/2016  CLINICAL DATA:  Shortness of breath worse with exertion and 10 pound weight gain in past 2 days, history CHF, COPD, hypertension, atrial fibrillation, asthma, type II diabetes mellitus EXAM: CHEST  2 VIEW COMPARISON:  12/11/2015 FINDINGS: Enlargement of cardiac silhouette with pulmonary vascular congestion. Linear scarring versus chronic subsegmental atelectasis in the mid lungs bilaterally, stable. No acute pulmonary edema or segmental consolidation. No pleural effusion or pneumothorax. Scattered degenerative changes thoracic spine. IMPRESSION: Enlargement of cardiac silhouette with pulmonary vascular congestion. Persistent atelectasis versus scarring in mid lungs bilaterally without acute infiltrate. Electronically Signed   By: Ulyses Southward M.D.   On: 01/13/2016 16:43   Dg Chest  Port 1 View  02/02/2016  CLINICAL DATA:  Shortness of breath EXAM: PORTABLE CHEST 1 VIEW COMPARISON:  01/13/2016 FINDINGS: Chronic cardiomegaly. Stable mediastinal contours. Chronic bilateral lung scarring. There is no edema, consolidation, effusion, or pneumothorax. IMPRESSION: 1. No acute finding. 2. Chronic cardiomegaly and lung scarring. Electronically Signed   By: Marnee Spring M.D.   On: 02/02/2016 23:58    Jeoffrey Massed, MD  [facility] Pager:[phone]  If 7PM-7AM, please contact night-coverage [email or web address] Password [email or web address] 02/04/2016, 11:08 AM   LOS: 1 day

## 2016-02-04 NOTE — Progress Notes (Signed)
Advanced Heart Failure Rounding Note  PCP: Dr. Shela Commons Copland Nephrologist: Dr. Briant Cedar HF: Bensimhon  Subjective:       Feeling a little worse today. More swollen. Feels like he is going backwards.  Not peeing with torsemide.   Weight up ~ 2 lbs. Creatinine improved 3.32 -> 3.05. K 3.4 this am. BUN remains markedly elevated at 127.  Objective:   Weight Range: 227 lb 9.6 oz (103.239 kg) Body mass index is 32.66 kg/(m^2).   Vital Signs:   Temp:  [97 F (36.1 C)-98.2 F (36.8 C)] 97.8 F (36.6 C) (03/15 0610) Pulse Rate:  [65-90] 79 (03/15 0610) Resp:  [13-22] 19 (03/15 0610) BP: (102-158)/(72-100) 112/84 mmHg (03/15 0610) SpO2:  [96 %-100 %] 100 % (03/15 0610) Weight:  [226 lb (102.513 kg)-227 lb 9.6 oz (103.239 kg)] 227 lb 9.6 oz (103.239 kg) (03/15 0232) Last BM Date: 02/03/16  Weight change: Filed Weights   02/03/16 0257 02/03/16 1511 02/04/16 0232  Weight: 222 lb 4.8 oz (100.835 kg) 226 lb (102.513 kg) 227 lb 9.6 oz (103.239 kg)    Intake/Output:   Intake/Output Summary (Last 24 hours) at 02/04/16 0825 Last data filed at 02/04/16 8453  Gross per 24 hour  Intake   1220 ml  Output   1100 ml  Net    120 ml     Physical Exam: General: Obese, pleasant HEENT: normal Neck: supple. JVP difficult to assess but appears elevated. Carotids 2+ bilat; no bruits. No thyromegaly or nodule noted. Cor: PMI nondisplaced. irregularly irregular.  Lungs: Slightly diminished bases. Abdomen: Obese, soft, NT, mildly distended, no HSM. No bruits or masses. +BS  Extremities: no cyanosis, clubbing, rash. 1-2+ LE edema, shiny Neuro: alert & orientedx3, cranial nerves grossly intact. moves all 4 extremities w/o difficulty. Affect pleasant  Telemetry: Reviewed, afib  Labs: CBC  Recent Labs  02/03/16 0115 02/03/16 0914 02/04/16 0400  WBC 10.2 8.1 12.8*  NEUTROABS 7.2 7.2  --   HGB 11.8* 12.4* 11.2*  HCT 38.0* 39.8 35.7*  MCV 86.8 87.9 85.8  PLT 207 186 169   Basic  Metabolic Panel  Recent Labs  02/03/16 0914 02/04/16 0400  NA 139 133*  K 3.0* 3.4*  CL 89* 88*  CO2 33* 28  GLUCOSE 154* 189*  BUN 121* 127*  CREATININE 3.32* 3.05*  CALCIUM 10.6* 9.5  MG  --  2.5*   Liver Function Tests  Recent Labs  02/03/16 0914  AST 30  ALT 18  ALKPHOS 66  BILITOT 1.1  PROT 8.4*  ALBUMIN 4.1   No results for input(s): LIPASE, AMYLASE in the last 72 hours. Cardiac Enzymes  Recent Labs  02/03/16 0914  TROPONINI 0.12*    BNP: BNP (last 3 results)  Recent Labs  12/11/15 2345 01/13/16 1544 02/03/16 0115  BNP 1024.0* 843.5* 750.3*    ProBNP (last 3 results) No results for input(s): PROBNP in the last 8760 hours.   D-Dimer No results for input(s): DDIMER in the last 72 hours. Hemoglobin A1C No results for input(s): HGBA1C in the last 72 hours. Fasting Lipid Panel No results for input(s): CHOL, HDL, LDLCALC, TRIG, CHOLHDL, LDLDIRECT in the last 72 hours. Thyroid Function Tests No results for input(s): TSH, T4TOTAL, T3FREE, THYROIDAB in the last 72 hours.  Invalid input(s): FREET3  Other results:     Imaging/Studies:  Dg Chest Port 1 View  02/02/2016  CLINICAL DATA:  Shortness of breath EXAM: PORTABLE CHEST 1 VIEW COMPARISON:  01/13/2016 FINDINGS: Chronic cardiomegaly.  Stable mediastinal contours. Chronic bilateral lung scarring. There is no edema, consolidation, effusion, or pneumothorax. IMPRESSION: 1. No acute finding. 2. Chronic cardiomegaly and lung scarring. Electronically Signed   By: Marnee Spring M.D.   On: 02/02/2016 23:58     Latest Echo  Latest Cath   Medications:     Scheduled Medications: . allopurinol  100 mg Oral Daily  . apixaban  5 mg Oral BID  . budesonide (PULMICORT) nebulizer solution  0.25 mg Nebulization BID  . carvedilol  12.5 mg Oral BID WC  . docusate sodium  100 mg Oral BID  . doxycycline (VIBRAMYCIN) IV  100 mg Intravenous Q12H  . glipiZIDE  5 mg Oral BID AC  . hydrALAZINE  25 mg Oral  TID  . insulin aspart  0-9 Units Subcutaneous TID WC  . ipratropium-albuterol  3 mL Nebulization Q4H  . isosorbide mononitrate  60 mg Oral Daily  . methylPREDNISolone (SOLU-MEDROL) injection  40 mg Intravenous Daily  . [START ON 02/05/2016] metolazone  2.5 mg Oral Once per day on Mon Thu  . omega-3 acid ethyl esters  1 g Oral Daily  . potassium chloride SA  40 mEq Oral BID  . torsemide  100 mg Oral Daily  . torsemide  80 mg Oral q1800     Infusions:     PRN Medications:  acetaminophen **OR** acetaminophen, albuterol, baclofen, ondansetron **OR** ondansetron (ZOFRAN) IV, oxyCODONE-acetaminophen **AND** oxyCODONE   Assessment   1. Acute respiratory failure with hypoxia 2. Acute on chronic systolic CHF 3. 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 Chronic Afib 5. HTN 6. DM2 7. OSA - NOT on CPAP.   Plan    He is more overloaded today. Already got morning torsemide.  Will give 80 mg IV lasix this afternoon.  May need addition IV lasix this evening depending on response.   Should consult Renal. Had previously discussed Vein mapping. Would be reasonable to get this admission if possible.   Length of Stay: 1   Graciella Freer PA-C 02/04/2016, 8:25 AM  Advanced Heart Failure Team Pager [phone] (M-F; 7a - 4p)  Please contact [facility] for night-coverage after hours (4p -7a ) and weekends on [email or web address]   Patient seen and examined with Otilio Saber, PA-C. We discussed all aspects of the encounter. I agree with the assessment and plan as stated above.   Volume status back up. Will resume IV lasix. Encouraged to limit po intake. Creatinine trending back down but BUN is 127. Will order vein mapping as he will likely need HD in next few months. (No emergent indication). Continue apixaban for AF.   Bensimhon, Daniel,MD 2:30 PM

## 2016-02-04 NOTE — Progress Notes (Signed)
*  PRELIMINARY RESULTS* Echocardiogram echo has been performed.  Frank Davidson 02/04/2016, 12:36 PM

## 2016-02-05 ENCOUNTER — Inpatient Hospital Stay (HOSPITAL_COMMUNITY)
Admission: RE | Admit: 2016-02-05 | Payer: Commercial Managed Care - HMO | Source: Ambulatory Visit | Admitting: Internal Medicine

## 2016-02-05 LAB — GLUCOSE, CAPILLARY
GLUCOSE-CAPILLARY: 115 mg/dL — AB (ref 65–99)
GLUCOSE-CAPILLARY: 102 mg/dL — AB (ref 65–99)
GLUCOSE-CAPILLARY: 91 mg/dL (ref 65–99)
Glucose-Capillary: 94 mg/dL (ref 65–99)

## 2016-02-05 LAB — BASIC METABOLIC PANEL
ANION GAP: 16 — AB (ref 5–15)
BUN: 142 mg/dL — ABNORMAL HIGH (ref 6–20)
CALCIUM: 8.9 mg/dL (ref 8.9–10.3)
CO2: 24 mmol/L (ref 22–32)
Chloride: 90 mmol/L — ABNORMAL LOW (ref 101–111)
Creatinine, Ser: 3.53 mg/dL — ABNORMAL HIGH (ref 0.61–1.24)
GFR, EST AFRICAN AMERICAN: 19 mL/min — AB (ref >60)
GFR, EST NON AFRICAN AMERICAN: 17 mL/min — AB (ref >60)
Glucose, Bld: 121 mg/dL — ABNORMAL HIGH (ref 65–99)
POTASSIUM: 4.2 mmol/L (ref 3.5–5.1)
Sodium: 130 mmol/L — ABNORMAL LOW (ref 135–145)

## 2016-02-05 MED ORDER — SODIUM CHLORIDE 0.9% FLUSH: 3.0000 mL | INTRAVENOUS | Status: DC | PRN

## 2016-02-05 MED ORDER — SODIUM CHLORIDE 0.9 % IV SOLN: INTRAVENOUS | Status: DC

## 2016-02-05 MED ORDER — SODIUM CHLORIDE 0.9 % IV SOLN: 250.0000 mL | INTRAVENOUS | Status: DC | PRN

## 2016-02-05 MED ORDER — SODIUM CHLORIDE 0.9% FLUSH
3.0000 mL | Freq: Two times a day (BID) | INTRAVENOUS | Status: DC
  Administered 2016-02-05: 3 mL via INTRAVENOUS

## 2016-02-05 MED ORDER — FUROSEMIDE 10 MG/ML IJ SOLN
120.0000 mg | Freq: Three times a day (TID) | INTRAVENOUS | Status: DC
  Administered 2016-02-05 (×2): 120 mg via INTRAVENOUS
  Filled 2016-02-05 (×3): qty 12

## 2016-02-05 NOTE — Progress Notes (Signed)
  Advanced Heart Failure Rounding Note  PCP: Dr. J. Copland Nephrologist: Dr. Mattingly HF: Bensimhon  Subjective:    Echo 02/04/16 LVEF reduced to 10-15% from 25-30% in 08/2015  Some output on lasix but creatinine and weight continue to rise.  He denies SOB or worsening DOE in the past few weeks.   Weight up ~ 4 lbs despite IV lasix yesterday. Creatinine improved 3.32 -> 3.05 -> 3.5. K 4.2. BUN remains markedly elevated at 142.  Objective:   Weight Range: 231 lb 1.6 oz (104.826 kg) Body mass index is 33.16 kg/(m^2).   Vital Signs:   Temp:  [97.3 F (36.3 C)-97.9 F (36.6 C)] 97.6 F (36.4 C) (03/16 0723) Pulse Rate:  [61-111] 70 (03/16 0932) Resp:  [18-20] 18 (03/16 0932) BP: (96-104)/(61-79) 96/73 mmHg (03/16 0932) SpO2:  [95 %-100 %] 96 % (03/16 0936) Weight:  [231 lb 1.6 oz (104.826 kg)] 231 lb 1.6 oz (104.826 kg) (03/16 0618) Last BM Date: 02/05/16  Weight change: Filed Weights   02/03/16 1511 02/04/16 0232 02/05/16 0618  Weight: 226 lb (102.513 kg) 227 lb 9.6 oz (103.239 kg) 231 lb 1.6 oz (104.826 kg)    Intake/Output:   Intake/Output Summary (Last 24 hours) at 02/05/16 0956 Last data filed at 02/05/16 0900  Gross per 24 hour  Intake   1060 ml  Output   1300 ml  Net   -240 ml     Physical Exam: General: Obese, pleasant HEENT: normal Neck: supple. JVP difficult to assess but appears elevated nearly to jaw. Carotids 2+ bilat; no bruits. No thyromegaly or nodule noted. Cor: PMI nondisplaced. irregularly irregular.  Lungs: Slightly diminished bases. Abdomen: Obese, soft, NT, mildly distended, no HSM. No bruits or masses. +BS  Extremities: no cyanosis, clubbing, rash. 1-2+ LE edema, shiny, slightly cool to touch.  Neuro: alert & orientedx3, cranial nerves grossly intact. moves all 4 extremities w/o difficulty. Affect pleasant  Telemetry: Reviewed, afib  Labs: CBC  Recent Labs  02/03/16 0115 02/03/16 0914 02/04/16 0400  WBC 10.2 8.1 12.8*    NEUTROABS 7.2 7.2  --   HGB 11.8* 12.4* 11.2*  HCT 38.0* 39.8 35.7*  MCV 86.8 87.9 85.8  PLT 207 186 169   Basic Metabolic Panel  Recent Labs  02/04/16 0400 02/05/16 0450  NA 133* 130*  K 3.4* 4.2  CL 88* 90*  CO2 28 24  GLUCOSE 189* 121*  BUN 127* 142*  CREATININE 3.05* 3.53*  CALCIUM 9.5 8.9  MG 2.5*  --    Liver Function Tests  Recent Labs  02/03/16 0914  AST 30  ALT 18  ALKPHOS 66  BILITOT 1.1  PROT 8.4*  ALBUMIN 4.1   No results for input(s): LIPASE, AMYLASE in the last 72 hours. Cardiac Enzymes  Recent Labs  02/03/16 0914  TROPONINI 0.12*    BNP: BNP (last 3 results)  Recent Labs  12/11/15 2345 01/13/16 1544 02/03/16 0115  BNP 1024.0* 843.5* 750.3*    ProBNP (last 3 results) No results for input(s): PROBNP in the last 8760 hours.   D-Dimer No results for input(s): DDIMER in the last 72 hours. Hemoglobin A1C No results for input(s): HGBA1C in the last 72 hours. Fasting Lipid Panel No results for input(s): CHOL, HDL, LDLCALC, TRIG, CHOLHDL, LDLDIRECT in the last 72 hours. Thyroid Function Tests No results for input(s): TSH, T4TOTAL, T3FREE, THYROIDAB in the last 72 hours.  Invalid input(s): FREET3  Other results:     Imaging/Studies:  No results found.    Latest Echo  Latest Cath   Medications:     Scheduled Medications: . allopurinol  100 mg Oral Daily  . apixaban  5 mg Oral BID  . budesonide (PULMICORT) nebulizer solution  0.25 mg Nebulization BID  . carvedilol  12.5 mg Oral BID WC  . docusate sodium  100 mg Oral BID  . furosemide  80 mg Intravenous BID  . glipiZIDE  5 mg Oral BID AC  . hydrALAZINE  25 mg Oral TID  . insulin aspart  0-9 Units Subcutaneous TID WC  . ipratropium-albuterol  3 mL Nebulization Q6H  . isosorbide mononitrate  60 mg Oral Daily  . metolazone  2.5 mg Oral Once per day on Mon Thu  . omega-3 acid ethyl esters  1 g Oral Daily  . potassium chloride SA  40 mEq Oral BID    Infusions:     PRN Medications: acetaminophen **OR** acetaminophen, albuterol, baclofen, ondansetron **OR** ondansetron (ZOFRAN) IV, oxyCODONE-acetaminophen **AND** oxyCODONE   Assessment   1. Acute respiratory failure with hypoxia 2. Acute on chronic systolic CHF 3. 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 Chronic Afib 5. HTN 6. DM2 7. OSA - NOT on CPAP.   Plan    He remains volume overloaded. Will continue IV lasix for now with renal consult pending.   With even further reduced EF, do not think he would tolerate HD very well.   Will put in for RHC tomorrow morning to look at cardiac output and pressures.    Length of Stay: 2   Graciella Freer PA-C 02/05/2016, 9:56 AM  Advanced Heart Failure Team Pager [phone] (M-F; 7a - 4p)  Please contact [facility] for night-coverage after hours (4p -7a ) and weekends on [email or web address]  Patient seen and examined with Otilio Saber, PA-C. We discussed all aspects of the encounter. I agree with the assessment and plan as stated above.   Volume status and renal function getting worse. Not responding to IV lasix. Echo images reviewed personally and EF is now down to 15%. I am concerned about low output HF. Will plan RHC in the am. With low EF suspect he will have hard time tolerating HD.   Bensimhon, Daniel,MD 5:26 PM

## 2016-02-05 NOTE — Consult Note (Signed)
   [facility] Inpatient Consult   02/05/2016  Frank [facility] 1951-10-09 [id number] Patient is currently active with [facility] for chronic disease management services under his [facility].  Patient has been engaged by a [employer]. Admitted with HF exacerbation and Renal failure.  Met with the patient at bedside.  Patient endorses Dr. Janett Billow Copland as her primary care provider.  Patient states that he may have some difficulty getting to his MD appointments but was aware of his transportation benefit and would call for his appointments. Patient states he lives with his sister and niece prior to admission.   Our community based plan of care has focused on disease management and community resource support. Patient desires ongoing [place] [facility] services. Notified community nurse of inpatient status.  Patient will receive a post discharge transition of care call and will be evaluated for monthly home visits for assessments and disease process education.  Will make Inpatient Case Manager aware that [facility] following. Of note, [facility] Care Management services does not replace or interfere with any services that are needed or arranged by inpatient case management or social work.  For additional questions or referrals please contact: Natividad Brood, RN BSN [facility] Liaison  [phone] business mobile phone Toll free office [phone]

## 2016-02-05 NOTE — Consult Note (Signed)
Frank [facility] Admit Date: 02/02/2016 02/05/2016 Frank Davidson Requesting Physician:  Frank Davidson  Reason for Consult:  AoCKD HPI:  65M seen at the request of Dr. Jerral Davidson for the evaluation of acute on chronic renal insufficiency. Patient's pertinent past medical history includes chronic systolic heart failure from nonischemic cardiomyopathy, obstructive airways disease with FEV1 of 50%, obesity, paroxysmal atrial fibrillation, type 2 diabetes, hypertension, obstructive sleep apnea on CPAP, PVD.  He follows in our office with Dr. Briant [facility]. Baseline serum creatinine is the low 2's. Because of his comorbidities he has been encouraged to pursue vascular access but has not to this point.  He was admitted 3/13 with worsened dyspnea including exertional component. He has been treated for COPD but also for an acute exacerbation of systolic heart failure. Repeat TTE demonstrates worsened LVEF now at 10-15%. He has been above his baseline weight of 219 pounds and receiving increasing dosing of diuretics with inadequate effect. In addition his renal function was worsened upon presentation and further so today. His BUN has increased to nearly 150. Renal ultrasound obtained last October demonstrates normal size kidneys with no structural abnormalities. Urinalysis upon presentation was without proteinuria or pyuria.  Today he states he feels overall stable and dyspnea is okay as long as not moving. His appetite is okay. Tentative plan for right heart catheterization tomorrow. Current Lasix dosing is 80 mg IV twice a day. Urine output yesterday was 1.5 L for approximately 0.6 L net negative.   CREAT (mg/dL)  Date Value  16/08/9603 3.04*  11/13/2015 3.38*  09/12/2015 2.80*  09/11/2015 2.61*  09/04/2015 2.57*  06/30/2015 4.61*  06/14/2015 3.06*  05/05/2015 2.68*  02/19/2015 2.66*  02/03/2015 2.85*   CREATININE, SER (mg/dL)  Date Value  54/07/8118 3.53*  02/04/2016 3.05*  02/03/2016 3.32*   02/03/2016 3.39*  01/15/2016 2.32*  01/13/2016 2.30*  12/13/2015 2.56*  12/12/2015 2.37*  12/11/2015 2.44*  10/30/2015 2.58*  ] I/Os: I/O last 3 completed shifts: In: 1690 [P.O.:1440; IV Piggyback:250] Out: 2050 [Urine:2050]  ROS NSAIDS: denies use IV Contrast no exposure TMP/SMX no exposure Hypotension no exposure Balance of 12 systems is negative w/ exceptions as above  PMH  Past Medical History  Diagnosis Date  . Atrial fibrillation (HCC)     PAF  . Hypertension   . COPD (chronic obstructive pulmonary disease) (HCC)   . Ocular herpes   . Gout   . CHF (congestive heart failure) (HCC)   . Chronic pain   . Hyperlipidemia   . Nonischemic cardiomyopathy (HCC) Aug 2015    EF 15%  . Ocular herpes   . Neuropathy (HCC)   . Left-sided weakness     "because of the arthritis and edema"  . PVD (peripheral vascular disease) [facility]) May 2015    abnormal ABIs  . Memory impairment     "short term"  . CKD (chronic kidney disease), stage III     Dr. Briant Davidson follows-holding   . Fibromyalgia     neuropathy- hands, more than feet.  . Sleep apnea with use of continuous positive airway pressure (CPAP)     does not use cpap  . GERD (gastroesophageal reflux disease)     tums as needed  . Asthma   . Type II diabetes mellitus (HCC)   . Stroke [facility]) X 5    last stroke aug 2014"; denies residual on 09/12/2014  . Osteoarthritis   . Arthritis     "knees, right elbow, shoulder" (09/12/2014)  . Shortness of breath dyspnea  PSH  Past Surgical History  Procedure Laterality Date  . 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 Tonsillectomy  age 77  . Esophagogastroduodenoscopy N/A 12/24/2013    Procedure: ESOPHAGOGASTRODUODENOSCOPY (EGD);  Surgeon: Frank Davidson;  Location: [facility];  Service: [facility];  Laterality: N/A;  . Colonoscopy N/A 12/24/2013    Procedure: COLONOSCOPY;  Surgeon: Frank Davidson;  Location: Frank [facility];  Service: [facility];  Laterality: N/A;  . Joint  replacement    . Cardiac catheterization  10/2006    normal coronary arteries;   Marland Kitchen Cataract extraction w/ intraocular lens  implant, bilateral Bilateral ~ 2008  . Total hip arthroplasty Left 09/12/2014    Procedure: LEFT TOTAL HIP ARTHROPLASTY ANTERIOR APPROACH;  Surgeon: Frank Davidson;  Location[facility];  Service: Orthopedics;  Laterality: Left;  . Right heart catheterization N/A 08/13/2014    Rt ht cath prior to hip surgery   FH  Family History  Problem Relation Age of Onset  . Cancer Mother   . Hypertension Mother   . Lung disease Mother   . Diabetes Father   . Diabetes Sister   . Hypertension Sister   . Hypertension Brother   . Hypertension Sister   . Colon cancer Neg Hx    SH  reports that he quit smoking about 9 years ago. His smoking use included Cigarettes. He has a 6.5 pack-year smoking history. He has never used smokeless tobacco. He reports that he does not drink alcohol or use illicit drugs. Allergies  Allergies  Allergen Reactions  . Corlanor [Ivabradine] Palpitations and Other (See Comments)    Kidney and heart problems, syncope  . Entresto [Sacubitril-Valsartan]     Syncope  . Januvia [Sitagliptin] Other (See Comments)    "Almost killed me"   . Lunesta [Eszopiclone] Palpitations and Other (See Comments)    Kidney and heart problems, syncope  . Actos [Pioglitazone] Other (See Comments)    Caused blood in urine and fluid retention    Home medications Prior to Admission medications   Medication Sig Start Date End Date Taking? Authorizing Provider  albuterol (PROVENTIL HFA;VENTOLIN HFA) 108 (90 BASE) MCG/ACT inhaler Inhale 2 puffs into the lungs every 6 (six) hours as needed for wheezing or shortness of breath. 06/30/15  Yes Frank Found Copland, [facility]  albuterol (PROVENTIL) (2.5 MG/3ML) 0.083% nebulizer solution Take 3 mLs (2.5 mg total) by nebulization every 4 (four) hours as needed for wheezing or shortness of breath. 11/26/15  Yes Frank Dane, [facility]  allopurinol  (ZYLOPRIM) 100 MG tablet TAKE 1 TABLET (100 MG TOTAL) BY MOUTH DAILY. 10/30/15  Yes Frank Found Copland, [facility]  apixaban (ELIQUIS) 5 MG TABS tablet Take 1 tablet (5 mg total) by mouth 2 (two) times daily. 02/04/15  Yes Dolores Patty, [facility]  baclofen (LIORESAL) 10 MG tablet TAKE 1 TABLET EVERY 8 HOURS AS NEEDED FOR MUSCLE SPASM(S) 01/12/16  Yes Wallis Bamberg, PA-C  carvedilol (COREG) 12.5 MG tablet Take 12.5 mg by mouth 2 (two) times daily with a meal.   Yes Historical Provider, [facility]  diclofenac (FLECTOR) 1.3 % PTCH Place 1 patch onto the skin 2 (two) times daily as needed (pain). 11/11/15  Yes Frank Found Copland, [facility]  docusate sodium 100 MG CAPS Take 100 mg by mouth 2 (two) times daily. 09/16/14  Yes Matthew Babish, PA-C  glipiZIDE (GLUCOTROL) 5 MG tablet Take 5 mg by mouth 2 (two) times daily before a meal.   Yes Historical  Provider, [facility]  hydrALAZINE (APRESOLINE) 50 MG tablet Take 1 tablet (50 mg total) by mouth 3 (three) times daily. 09/15/15  Yes Laurey Morale, [facility]  ipratropium-albuterol (DUONEB) 0.5-2.5 (3) MG/3ML SOLN Take 3 mLs by nebulization 4 (four) times daily. 12/23/15  Yes Michele Mcalpine, [facility]  isosorbide mononitrate (IMDUR) 60 MG 24 hr tablet Take 60 mg by mouth daily.   Yes Historical Provider, [facility]  metolazone (ZAROXOLYN) 2.5 MG tablet Take 1 tablet (2.5 mg total) by mouth 2 (two) times a week. Tuesday and Friday 09/29/15  Yes Dolores Patty, [facility]  Multiple Vitamins-Minerals (MULTIVITAMIN WITH MINERALS) tablet Take 1 tablet by mouth daily.   Yes Historical Provider, [facility]  omega-3 acid ethyl esters (LOVAZA) 1 G capsule Take 1 g by mouth daily.   Yes Historical Provider, [facility]  oxyCODONE-acetaminophen (PERCOCET) 10-325 MG tablet Take 1 tablet by mouth every 8 (eight) hours as needed for pain. Use with stool softerner. 01/13/16  Yes Sarah Harvie Bridge, PA-C  polyethylene glycol powder (GLYCOLAX/MIRALAX) powder MIX 1 CAPFUL (17GM)  IN  LIQUID  AND  DRINK EVERY DAY AS DIRECTED 07/31/15  Yes Frank Found Copland, [facility]   potassium chloride SA (K-DUR,KLOR-CON) 20 MEQ tablet Take 2 tablets (40 mEq total) by mouth 2 (two) times daily. Take 2 tabs in AM and 1 tab in PM 01/15/16  Yes Amy D Clegg, NP  pregabalin (LYRICA) 75 MG capsule Take 75 mg by mouth 2 (two) times daily.   Yes Historical Provider, [facility]  tiotropium (SPIRIVA HANDIHALER) 18 MCG inhalation capsule Place 1 capsule (18 mcg total) into inhaler and inhale daily. 09/29/15  Yes Michele Mcalpine, [facility]  torsemide (DEMADEX) 20 MG tablet TAKE 5  TABLETS (100 MG) IN AM AND 4 TABLETS IN PM. 01/15/16  Yes Amy D Clegg, NP    Current Medications Scheduled Meds: . allopurinol  100 mg Oral Daily  . apixaban  5 mg Oral BID  . budesonide (PULMICORT) nebulizer solution  0.25 mg Nebulization BID  . carvedilol  12.5 mg Oral BID WC  . docusate sodium  100 mg Oral BID  . furosemide  80 mg Intravenous BID  . glipiZIDE  5 mg Oral BID AC  . hydrALAZINE  25 mg Oral TID  . insulin aspart  0-9 Units Subcutaneous TID WC  . ipratropium-albuterol  3 mL Nebulization Q6H  . isosorbide mononitrate  60 mg Oral Daily  . metolazone  2.5 mg Oral Once per day on Mon Thu  . omega-3 acid ethyl esters  1 g Oral Daily  . potassium chloride SA  40 mEq Oral BID   Continuous Infusions:  PRN Meds:.acetaminophen **OR** acetaminophen, albuterol, baclofen, ondansetron **OR** ondansetron (ZOFRAN) IV, oxyCODONE-acetaminophen **AND** oxyCODONE  CBC  Recent Labs Lab 02/03/16 0115 02/03/16 0914 02/04/16 0400  WBC 10.2 8.1 12.8*  NEUTROABS 7.2 7.2  --   HGB 11.8* 12.4* 11.2*  HCT 38.0* 39.8 35.7*  MCV 86.8 87.9 85.8  PLT 207 186 169   Basic Metabolic Panel  Recent Labs Lab 02/03/16 0115 02/03/16 0914 02/04/16 0400 02/05/16 0450  NA 137 139 133* 130*  K 5.6* 3.0* 3.4* 4.2  CL 90* 89* 88* 90*  CO2 30 33* 28 24  GLUCOSE 96 154* 189* 121*  BUN 124* 121* 127* 142*  CREATININE 3.39* 3.32* 3.05* 3.53*  CALCIUM 9.8 10.6* 9.5 8.9    Physical Exam  Blood pressure 96/73, pulse 70,  temperature 97.6 F (36.4 C), temperature source Oral, resp. rate 18, height 5'  10" (1.778 m), weight 104.826 kg (231 lb 1.6 oz), SpO2 96 %. GEN: NAD, sitting on edge of bed ENT: NCAT, [place] in place EYES: EOMI CV: RRR, no mgr PULM: CTAB ABD: s/nt/nd, protuberant SKIN: no rashes/lesions EXT:1-2+ LEE   Assessment 60M with AoCKD 2/2 cardiorenal syndrome with acute worsened LVEF and acute on chronic systolic CHF  1. AoCKD 2/2 Cardiorenal Syndrome 1. BL SCr low 2s, sees Mattingly at [facility] 2. No outpt HD planning to date 3. Marginal success with diuretics thus far 4. Progressive Azotemia, K and HCO3 ok 5. Pt willing to rec RRT if indicated 2. A/C systolic CHF 3. COPD, ? Acute exacerbation 4. Paroxysmal AFib on apixaban 5. DM2 6. HTN 7. Obesity BMI 32.5  Plan 1. Agree likely will need more eval for CHF, possible inotrope based on results 2. Inc lasix to TID, 120 mg at current time 3. If ineffective or uremia develops move towards RRT for UF 4. Down the road will need [facility] to eval for permanent access Daily weights, Daily Renal Panel, Strict I/Os, Avoid nephrotoxins (NSAIDs, judicious IV Contrast) Will follow along closely  Sabra Heck [facility] [phone] pgr 02/05/2016, 11:21 AM

## 2016-02-05 NOTE — Progress Notes (Signed)
RT NOTE:  Pt has order to wear CPAP with sleep. Pt only wore 30 minutes last night. He states he is not going to wear tonight. RT explained the importance of the therapy. Pt understands and continues to refuse. Pt understands is he changes his mind he can call RN or RT. RT will monitor.

## 2016-02-05 NOTE — Progress Notes (Signed)
Utilization review completed. Johngabriel Verde, RN, BSN. 

## 2016-02-05 NOTE — Progress Notes (Signed)
PATIENT DETAILS Name: Frank Davidson Age: 65 y.o. Sex: male Date of Birth: 1951/06/16 Admit Date: 02/02/2016 Admitting Physician Eduard Clos, MD UEA:[id number], MD  Subjective: Didn't urinate much yesterday. Slightly. Sitting up in bed, shortness of breath has not worsened compared to yesterday.  Assessment/Plan: Principal Problem: Acute respiratory failure with hypoxia: secondary to acute systolic heart failure,improving. Continue diuretics at the discretion of cardiology.   Active Problems: Acute systolic heart failure: CHF team consulted, continue diuretics, Coreg- follow intake/output, daily weights. Not a candidate for ACEI/ARB given his advanced CKD. CHF team planning on RHC 3/17  Acute on chronic renal failure stage IV: ARF likely secondary to cardiorenal syndrome.Renal consulted-may need RRT soon.   Chronic atrial fibrillation: Continue Eliquis, on Coreg for rate control.  Type 2 diabetes: CBGs relatively stable with glipizide and SSI. Follow and adjust accordingly.  Hypertension: Controlled-continue Coreg, hydralazine and Imdur.  OSA: Continue CPAP.  Gout: No evidence of acute gouty arthritis-continue allopurinol.  Disposition: Remain inpatient  Antimicrobial agents  See below  Anti-infectives    Start     Dose/Rate Route Frequency Ordered Stop   02/03/16 1000  doxycycline (VIBRAMYCIN) 100 mg in dextrose 5 % 250 mL IVPB  Status:  Discontinued     100 mg 125 mL/hr over 120 Minutes Intravenous Every 12 hours 02/03/16 0813 02/04/16 1108      DVT Prophylaxis: Eliquis  Code Status: Full code  Family Communication None at bedside  Procedures: None  CONSULTS:  cardiology  Time spent 25 minutes-Greater than 50% of this time was spent in counseling, explanation of diagnosis, planning of further management, and coordination of care.  MEDICATIONS: Scheduled Meds: . allopurinol  100 mg Oral Daily  . apixaban  5 mg Oral  BID  . budesonide (PULMICORT) nebulizer solution  0.25 mg Nebulization BID  . carvedilol  12.5 mg Oral BID WC  . docusate sodium  100 mg Oral BID  . furosemide  120 mg Intravenous TID  . glipiZIDE  5 mg Oral BID AC  . hydrALAZINE  25 mg Oral TID  . insulin aspart  0-9 Units Subcutaneous TID WC  . ipratropium-albuterol  3 mL Nebulization Q6H  . isosorbide mononitrate  60 mg Oral Daily  . metolazone  2.5 mg Oral Once per day on Mon Thu  . omega-3 acid ethyl esters  1 g Oral Daily  . potassium chloride SA  40 mEq Oral BID   Continuous Infusions:   PRN Meds:.acetaminophen **OR** acetaminophen, albuterol, baclofen, ondansetron **OR** ondansetron (ZOFRAN) IV, oxyCODONE-acetaminophen **AND** oxyCODONE    PHYSICAL EXAM: Vital signs in last 24 hours: Filed Vitals:   02/05/16 0723 02/05/16 0932 02/05/16 0936 02/05/16 1150  BP: 104/79 96/73  95/69  Pulse: 65 70  87  Temp: 97.6 F (36.4 C)   97.2 F (36.2 C)  TempSrc: Oral   Oral  Resp: Height:      Weight:      SpO2: 100% 96% 96% 100%    Weight change: 2.313 kg (5 lb 1.6 oz) Filed Weights   02/03/16 1511 02/04/16 0232 02/05/16 0618  Weight: 102.513 kg (226 lb) 103.239 kg (227 lb 9.6 oz) 104.826 kg (231 lb 1.6 oz)   Body mass index is 33.16 kg/(m^2).   Gen Exam: Awake and alert with clear speech.  Neck: Supple, +JVD.   Chest: B/L Clear.   CVS: S1 S2 Regular, no  murmurs.  Abdomen: soft, BS +, non tender, non distended.  Extremities: +edema, lower extremities warm to touch. Neurologic: Non Focal.   Skin: No Rash.   Wounds: N/A.   Intake/Output from previous day:  Intake/Output Summary (Last 24 hours) at 02/05/16 1205 Last data filed at 02/05/16 0900  Gross per 24 hour  Intake    860 ml  Output   1050 ml  Net   -190 ml     LAB RESULTS: CBC  Recent Labs Lab 02/03/16 0115 02/03/16 0914 02/04/16 0400  WBC 10.2 8.1 12.8*  HGB 11.8* 12.4* 11.2*  HCT 38.0* 39.8 35.7*  PLT 207 186 169  MCV 86.8 87.9  85.8  MCH 26.9 27.4 26.9  MCHC 31.1 31.2 31.4  RDW 17.9* 17.8* 17.5*  LYMPHSABS 1.6 0.7  --   MONOABS 1.2* 0.2  --   EOSABS 0.3 0.0  --   BASOSABS 0.0 0.0  --     Chemistries   Recent Labs Lab 02/03/16 0115 02/03/16 0914 02/04/16 0400 02/05/16 0450  NA 137 139 133* 130*  K 5.6* 3.0* 3.4* 4.2  CL 90* 89* 88* 90*  CO2 30 33* 28 24  GLUCOSE 96 154* 189* 121*  BUN 124* 121* 127* 142*  CREATININE 3.39* 3.32* 3.05* 3.53*  CALCIUM 9.8 10.6* 9.5 8.9  MG  --   --  2.5*  --     CBG:  Recent Labs Lab 02/04/16 1152 02/04/16 1642 02/04/16 2144 02/05/16 0606 02/05/16 1120  GLUCAP 178* 185* 146* 115* 102*    GFR Estimated Creatinine Clearance: 25.3 mL/min (by C-G formula based on Cr of 3.53).  Coagulation profile No results for input(s): INR, PROTIME in the last 168 hours.  Cardiac Enzymes  Recent Labs Lab 02/03/16 0914  TROPONINI 0.12*    Invalid input(s): POCBNP No results for input(s): DDIMER in the last 72 hours. No results for input(s): HGBA1C in the last 72 hours. No results for input(s): CHOL, HDL, LDLCALC, TRIG, CHOLHDL, LDLDIRECT in the last 72 hours. No results for input(s): TSH, T4TOTAL, T3FREE, THYROIDAB in the last 72 hours.  Invalid input(s): FREET3 No results for input(s): VITAMINB12, FOLATE, FERRITIN, TIBC, IRON, RETICCTPCT in the last 72 hours. No results for input(s): LIPASE, AMYLASE in the last 72 hours.  Urine Studies No results for input(s): UHGB, CRYS in the last 72 hours.  Invalid input(s): UACOL, UAPR, USPG, UPH, UTP, UGL, UKET, UBIL, UNIT, UROB, ULEU, UEPI, UWBC, URBC, UBAC, CAST, UCOM, BILUA  MICROBIOLOGY: No results found for this or any previous visit (from the past 240 hour(s)).  RADIOLOGY STUDIES/RESULTS: Dg Chest 2 View  01/13/2016  CLINICAL DATA:  Shortness of breath worse with exertion and 10 pound weight gain in past 2 days, history CHF, COPD, hypertension, atrial fibrillation, asthma, type II diabetes mellitus EXAM: CHEST   2 VIEW COMPARISON:  12/11/2015 FINDINGS: Enlargement of cardiac silhouette with pulmonary vascular congestion. Linear scarring versus chronic subsegmental atelectasis in the mid lungs bilaterally, stable. No acute pulmonary edema or segmental consolidation. No pleural effusion or pneumothorax. Scattered degenerative changes thoracic spine. IMPRESSION: Enlargement of cardiac silhouette with pulmonary vascular congestion. Persistent atelectasis versus scarring in mid lungs bilaterally without acute infiltrate. Electronically Signed   By: Ulyses Southward M.D.   On: 01/13/2016 16:43   Dg Chest Port 1 View  02/02/2016  CLINICAL DATA:  Shortness of breath EXAM: PORTABLE CHEST 1 VIEW COMPARISON:  01/13/2016 FINDINGS: Chronic cardiomegaly. Stable mediastinal contours. Chronic bilateral lung scarring. There is no edema, consolidation,  effusion, or pneumothorax. IMPRESSION: 1. No acute finding. 2. Chronic cardiomegaly and lung scarring. Electronically Signed   By: Marnee Spring M.D.   On: 02/02/2016 23:58    Jeoffrey Massed, MD  [facility] Pager:[phone]  If 7PM-7AM, please contact night-coverage [email or web address] Password [email or web address] 02/05/2016, 12:05 PM   LOS: 2 days

## 2016-02-06 ENCOUNTER — Encounter (HOSPITAL_COMMUNITY)
Admission: EM | Disposition: A | Discharge status: Home / Self Care | Payer: Self-pay | Source: Home / Self Care | Attending: Internal Medicine

## 2016-02-06 ENCOUNTER — Encounter (HOSPITAL_COMMUNITY): Payer: Self-pay | Admitting: Internal Medicine

## 2016-02-06 HISTORY — PX: CARDIAC CATHETERIZATION: SHX172

## 2016-02-06 LAB — POCT I-STAT 3, VENOUS BLOOD GAS (G3P V)
Acid-Base Excess: 5 mmol/L — ABNORMAL HIGH (ref 0.0–2.0)
Acid-Base Excess: 6 mmol/L — ABNORMAL HIGH (ref 0.0–2.0)
BICARBONATE: 30.4 meq/L — AB (ref 20.0–24.0)
BICARBONATE: 31.8 meq/L — AB (ref 20.0–24.0)
O2 Saturation: 38 %
O2 Saturation: 46 %
PCO2 VEN: 46.4 mmHg (ref 45.0–50.0)
PCO2 VEN: 47.6 mmHg (ref 45.0–50.0)
PO2 VEN: 22 mmHg — AB (ref 31.0–45.0)
TCO2: 32 mmol/L (ref 0–100)
TCO2: 33 mmol/L (ref 0–100)
pH, Ven: 7.424 — ABNORMAL HIGH (ref 7.250–7.300)
pH, Ven: 7.434 — ABNORMAL HIGH (ref 7.250–7.300)
pO2, Ven: 25 mmHg — ABNORMAL LOW (ref 31.0–45.0)

## 2016-02-06 LAB — BASIC METABOLIC PANEL
Anion gap: 17 — ABNORMAL HIGH (ref 5–15)
BUN: 149 mg/dL — AB (ref 6–20)
CHLORIDE: 91 mmol/L — AB (ref 101–111)
CO2: 26 mmol/L (ref 22–32)
CREATININE: 3.51 mg/dL — AB (ref 0.61–1.24)
Calcium: 9.5 mg/dL (ref 8.9–10.3)
GFR calc Af Amer: 20 mL/min — ABNORMAL LOW (ref >60)
GFR, EST NON AFRICAN AMERICAN: 17 mL/min — AB (ref >60)
Glucose, Bld: 89 mg/dL (ref 65–99)
Potassium: 4.1 mmol/L (ref 3.5–5.1)
SODIUM: 134 mmol/L — AB (ref 135–145)

## 2016-02-06 LAB — CARBOXYHEMOGLOBIN
CARBOXYHEMOGLOBIN: 1.7 % — AB (ref 0.5–1.5)
METHEMOGLOBIN: 1 % (ref 0.0–1.5)
O2 Saturation: 34.4 %
TOTAL HEMOGLOBIN: 13 g/dL — AB (ref 13.5–18.0)

## 2016-02-06 LAB — PROTIME-INR
INR: 1.71 — ABNORMAL HIGH (ref 0.00–1.49)
Prothrombin Time: 20.1 seconds — ABNORMAL HIGH (ref 11.6–15.2)

## 2016-02-06 LAB — GLUCOSE, CAPILLARY
Glucose-Capillary: 82 mg/dL (ref 65–99)
Glucose-Capillary: 227 mg/dL — ABNORMAL HIGH (ref 65–99)
Glucose-Capillary: 168 mg/dL — ABNORMAL HIGH (ref 65–99)
Glucose-Capillary: 103 mg/dL — ABNORMAL HIGH (ref 65–99)

## 2016-02-06 LAB — MRSA PCR SCREENING: MRSA by PCR: NEGATIVE

## 2016-02-06 LAB — APTT: APTT: 45 s — AB (ref 24–37)

## 2016-02-06 LAB — HEPARIN LEVEL (UNFRACTIONATED): Heparin Unfractionated: >2.2 IU/mL — ABNORMAL HIGH (ref 0.30–0.70)

## 2016-02-06 SURGERY — RIGHT HEART CATH

## 2016-02-06 MED ORDER — ONDANSETRON HCL 4 MG/2ML IJ SOLN: 4.0000 mg | Freq: Four times a day (QID) | INTRAMUSCULAR | Status: DC | PRN

## 2016-02-06 MED ORDER — ALUM & MAG HYDROXIDE-SIMETH 200-200-20 MG/5ML PO SUSP
ORAL | Status: AC
  Filled 2016-02-06: qty 30

## 2016-02-06 MED ORDER — SODIUM CHLORIDE 0.9 % IV SOLN: 250.0000 mL | INTRAVENOUS | Status: DC | PRN

## 2016-02-06 MED ORDER — IPRATROPIUM-ALBUTEROL 0.5-2.5 (3) MG/3ML IN SOLN
3.0000 mL | Freq: Three times a day (TID) | RESPIRATORY_TRACT | Status: DC
  Administered 2016-02-06 – 2016-02-09 (×9): 3 mL via RESPIRATORY_TRACT
  Filled 2016-02-06 (×9): qty 3

## 2016-02-06 MED ORDER — HEPARIN (PORCINE) IN NACL 2-0.9 UNIT/ML-% IJ SOLN
INTRAMUSCULAR | Status: AC
  Filled 2016-02-06: qty 1000

## 2016-02-06 MED ORDER — LIDOCAINE HCL (PF) 1 % IJ SOLN
INTRAMUSCULAR | Status: AC
  Filled 2016-02-06: qty 30

## 2016-02-06 MED ORDER — MILRINONE IN DEXTROSE 20 MG/100ML IV SOLN
0.2500 ug/kg/min | INTRAVENOUS | Status: DC
Start: 2016-02-06 — End: 2016-02-06
  Administered 2016-02-06: 0.25 ug/kg/min via INTRAVENOUS
  Filled 2016-02-06: qty 100

## 2016-02-06 MED ORDER — SODIUM CHLORIDE 0.9% FLUSH: 3.0000 mL | INTRAVENOUS | Status: DC | PRN

## 2016-02-06 MED ORDER — DOBUTAMINE IN D5W 4-5 MG/ML-% IV SOLN
5.0000 ug/kg/min | INTRAVENOUS | Status: DC
  Administered 2016-02-06 – 2016-02-07 (×2): 5 ug/kg/min via INTRAVENOUS
  Filled 2016-02-06 (×2): qty 250

## 2016-02-06 MED ORDER — HEPARIN (PORCINE) IN NACL 100-0.45 UNIT/ML-% IJ SOLN
1150.0000 [IU]/h | INTRAMUSCULAR | Status: DC
  Administered 2016-02-06: 1400 [IU]/h via INTRAVENOUS
  Administered 2016-02-07: 1250 [IU]/h via INTRAVENOUS
  Administered 2016-02-08 – 2016-02-12 (×5): 1150 [IU]/h via INTRAVENOUS
  Filled 2016-02-06 (×7): qty 250

## 2016-02-06 MED ORDER — SODIUM CHLORIDE 0.9% FLUSH
3.0000 mL | Freq: Two times a day (BID) | INTRAVENOUS | Status: DC
  Administered 2016-02-06 – 2016-02-11 (×4): 3 mL via INTRAVENOUS

## 2016-02-06 MED ORDER — SODIUM CHLORIDE 0.9 % IV SOLN
250.0000 mL | INTRAVENOUS | Status: DC | PRN
  Administered 2016-02-09: 250 mL via INTRAVENOUS

## 2016-02-06 MED ORDER — ACETAMINOPHEN 325 MG PO TABS
650.0000 mg | ORAL_TABLET | ORAL | Status: DC | PRN
  Administered 2016-02-09: 650 mg via ORAL
  Filled 2016-02-06: qty 2

## 2016-02-06 MED ORDER — SODIUM CHLORIDE 0.9% FLUSH
3.0000 mL | Freq: Two times a day (BID) | INTRAVENOUS | Status: DC
  Administered 2016-02-06 – 2016-02-10 (×4): 3 mL via INTRAVENOUS

## 2016-02-06 MED ORDER — ALUM & MAG HYDROXIDE-SIMETH 200-200-20 MG/5ML PO SUSP
30.0000 mL | ORAL | Status: DC | PRN
  Administered 2016-02-06: 30 mL via ORAL

## 2016-02-06 MED ORDER — HEPARIN (PORCINE) IN NACL 2-0.9 UNIT/ML-% IJ SOLN
INTRAMUSCULAR | Status: DC | PRN
  Administered 2016-02-06: 10 mL

## 2016-02-06 MED ORDER — FUROSEMIDE 10 MG/ML IJ SOLN
10.0000 mg/h | INTRAVENOUS | Status: DC
  Administered 2016-02-06 (×2): 20 mg/h via INTRAVENOUS
  Administered 2016-02-07 – 2016-02-08 (×2): 10 mg/h via INTRAVENOUS
  Filled 2016-02-06 (×4): qty 25

## 2016-02-06 SURGICAL SUPPLY — 6 items
CATH SWAN GANZ 7F STRAIGHT (CATHETERS) ×3 IMPLANT
KIT HEART RIGHT NAMIC (KITS) ×3 IMPLANT
PACK CARDIAC CATHETERIZATION (CUSTOM PROCEDURE TRAY) ×3 IMPLANT
SHEATH PINNACLE 6F 10CM (SHEATH) ×3 IMPLANT
SLEEVE REPOSITIONING LENGTH 30 (MISCELLANEOUS) ×3 IMPLANT
TRANSDUCER W/STOPCOCK (MISCELLANEOUS) ×6 IMPLANT

## 2016-02-06 NOTE — Progress Notes (Signed)
PATIENT DETAILS Name: Frank Davidson Age: 65 y.o. Sex: male Date of Birth: 1951-02-22 Admit Date: 02/02/2016 Admitting Physician Eduard Clos, MD ZOX:[id number],[id number], MD  Brief narrative: 65 year old male with history of chronic systolic heart failure, admitted with shortness of breath and edema-second due to decompensated systolic heart failure. 2-D echocardiogram showed EF had decreased to around 15%, subsequently underwent right heart catheterization on 3/17 which showed significantly decreased cardiac output. Patient was subsequently started on milrinone infusion, Lasix infusion and transferred to the CCU. Nephrology was also consulted for possible initiation of dialysis in the near future. Since patient will be in the CCU, CHF team will be primary service, once the patient is stabilized and is transferred out of the CCU, CHF team will notify the hospitalist service will again  Subjective: No chest pain. Still with some mild dyspnea.   Assessment/Plan: Principal Problem: Acute respiratory failure with hypoxia: secondary to acute systolic heart failure,improving.    Active Problems: Acute systolic heart failure: CHF team consulted, 2-D echo showed EF decreased down to 15%-underwent right heart catheterization which shows significantly decreased cardiac output consistent with cardiac shock. Subsequently transferred to CCU, started on Lasix and milrinone infusion. Cardiology and nephrology continued to follow. Diuretics, other CHF medications and deferred to cardiology  Acute on chronic renal failure stage IV: ARF likely secondary to cardiorenal syndrome.Renal following-may need RRT soon.   Chronic atrial fibrillation: Continue Eliquis, on Coreg for rate control.  Type 2 diabetes: CBGs relatively stable with glipizide and SSI. Follow and adjust accordingly.  Hypertension: Controlled-continue Coreg, hydralazine and Imdur.  OSA: Continue CPAP.  Gout: No  evidence of acute gouty arthritis-continue allopurinol.  Disposition: Remain inpatient-patient will be transferred to CHF service, once out of the intensive care unit, hospitalist service will be notified by CHF team and will resume primary service again.  Antimicrobial agents  See below  Anti-infectives    Start     Dose/Rate Route Frequency Ordered Stop   02/03/16 1000  doxycycline (VIBRAMYCIN) 100 mg in dextrose 5 % 250 mL IVPB  Status:  Discontinued     100 mg 125 mL/hr over 120 Minutes Intravenous Every 12 hours 02/03/16 0813 02/04/16 1108      DVT Prophylaxis: Eliquis  Code Status: Full code  Family Communication None at bedside  Procedures: None  CONSULTS:  cardiology  Time spent 25 minutes-Greater than 50% of this time was spent in counseling, explanation of diagnosis, planning of further management, and coordination of care.  MEDICATIONS: Scheduled Meds: . allopurinol  100 mg Oral Daily  . apixaban  5 mg Oral BID  . budesonide (PULMICORT) nebulizer solution  0.25 mg Nebulization BID  . carvedilol  12.5 mg Oral BID WC  . docusate sodium  100 mg Oral BID  . glipiZIDE  5 mg Oral BID AC  . hydrALAZINE  25 mg Oral TID  . insulin aspart  0-9 Units Subcutaneous TID WC  . ipratropium-albuterol  3 mL Nebulization TID  . isosorbide mononitrate  60 mg Oral Daily  . metolazone  2.5 mg Oral Once per day on Mon Thu  . omega-3 acid ethyl esters  1 g Oral Daily  . potassium chloride SA  40 mEq Oral BID  . sodium chloride flush  3 mL Intravenous Q12H  . sodium chloride flush  3 mL Intravenous Q12H   Continuous Infusions: . furosemide (LASIX) infusion 20 mg/hr (02/06/16 1101)  . milrinone  0.25 mcg/kg/min (02/06/16 0951)   PRN Meds:.sodium chloride, sodium chloride, acetaminophen **OR** acetaminophen, acetaminophen, albuterol, baclofen, ondansetron **OR** ondansetron (ZOFRAN) IV, ondansetron (ZOFRAN) IV, oxyCODONE-acetaminophen **AND** oxyCODONE, sodium chloride flush,  sodium chloride flush    PHYSICAL EXAM: Vital signs in last 24 hours: Filed Vitals:   02/06/16 1037 02/06/16 1100 02/06/16 1149 02/06/16 1200  BP: 123/104 101/80  117/74  Pulse: 33 37 64 67  Temp:   98.1 F (36.7 C) 98.1 F (36.7 C)  TempSrc:      Resp: 25 15 20 19   Height:      Weight:      SpO2: 98% 92% 95% 95%    Weight change:  Filed Weights   02/03/16 1511 02/04/16 0232 02/05/16 0618  Weight: 102.513 kg (226 lb) 103.239 kg (227 lb 9.6 oz) 104.826 kg (231 lb 1.6 oz)   Body mass index is 33.16 kg/(m^2).   Gen Exam: Awake and alert with clear speech.  Neck: Supple, +JVD.   Chest: B/L Clear.   CVS: S1 S2 Regular, no murmurs.  Abdomen: soft, BS +, non tender, non distended.  Extremities: +edema, lower extremities warm to touch. Neurologic: Non Focal.   Skin: No Rash.   Wounds: N/A.   Intake/Output from previous day:  Intake/Output Summary (Last 24 hours) at 02/06/16 1219 Last data filed at 02/06/16 1101  Gross per 24 hour  Intake    392 ml  Output   3250 ml  Net  -2858 ml     LAB RESULTS: CBC  Recent Labs Lab 02/03/16 0115 02/03/16 0914 02/04/16 0400  WBC 10.2 8.1 12.8*  HGB 11.8* 12.4* 11.2*  HCT 38.0* 39.8 35.7*  PLT 207 186 169  MCV 86.8 87.9 85.8  MCH 26.9 27.4 26.9  MCHC 31.1 31.2 31.4  RDW 17.9* 17.8* 17.5*  LYMPHSABS 1.6 0.7  --   MONOABS 1.2* 0.2  --   EOSABS 0.3 0.0  --   BASOSABS 0.0 0.0  --     Chemistries   Recent Labs Lab 02/03/16 0115 02/03/16 0914 02/04/16 0400 02/05/16 0450 02/06/16 0526  NA 137 139 133* 130* 134*  K 5.6* 3.0* 3.4* 4.2 4.1  CL 90* 89* 88* 90* 91*  CO2 30 33* 28 24 26   GLUCOSE 96 154* 189* 121* 89  BUN 124* 121* 127* 142* 149*  CREATININE 3.39* 3.32* 3.05* 3.53* 3.51*  CALCIUM 9.8 10.6* 9.5 8.9 9.5  MG  --   --  2.5*  --   --     CBG:  Recent Labs Lab 02/05/16 0606 02/05/16 1120 02/05/16 1609 02/05/16 2056 02/06/16 0853  GLUCAP 115* 102* 94 91 82    GFR Estimated Creatinine  Clearance: 25.4 mL/min (by C-G formula based on Cr of 3.51).  Coagulation profile  Recent Labs Lab 02/06/16 0526  INR 1.71*    Cardiac Enzymes  Recent Labs Lab 02/03/16 0914  TROPONINI 0.12*    Invalid input(s): POCBNP No results for input(s): DDIMER in the last 72 hours. No results for input(s): HGBA1C in the last 72 hours. No results for input(s): CHOL, HDL, LDLCALC, TRIG, CHOLHDL, LDLDIRECT in the last 72 hours. No results for input(s): TSH, T4TOTAL, T3FREE, THYROIDAB in the last 72 hours.  Invalid input(s): FREET3 No results for input(s): VITAMINB12, FOLATE, FERRITIN, TIBC, IRON, RETICCTPCT in the last 72 hours. No results for input(s): LIPASE, AMYLASE in the last 72 hours.  Urine Studies No results for input(s): UHGB, CRYS in the last 72 hours.  Invalid input(s): UACOL,  UAPR, USPG, UPH, UTP, UGL, UKET, UBIL, UNIT, UROB, ULEU, UEPI, UWBC, URBC, UBAC, CAST, UCOM, BILUA  MICROBIOLOGY: No results found for this or any previous visit (from the past 240 hour(s)).  RADIOLOGY STUDIES/RESULTS: Dg Chest 2 View  01/13/2016  CLINICAL DATA:  Shortness of breath worse with exertion and 10 pound weight gain in past 2 days, history CHF, COPD, hypertension, atrial fibrillation, asthma, type II diabetes mellitus EXAM: CHEST  2 VIEW COMPARISON:  12/11/2015 FINDINGS: Enlargement of cardiac silhouette with pulmonary vascular congestion. Linear scarring versus chronic subsegmental atelectasis in the mid lungs bilaterally, stable. No acute pulmonary edema or segmental consolidation. No pleural effusion or pneumothorax. Scattered degenerative changes thoracic spine. IMPRESSION: Enlargement of cardiac silhouette with pulmonary vascular congestion. Persistent atelectasis versus scarring in mid lungs bilaterally without acute infiltrate. Electronically Signed   By: Ulyses Southward M.D.   On: 01/13/2016 16:43   Dg Chest Port 1 View  02/02/2016  CLINICAL DATA:  Shortness of breath EXAM: PORTABLE CHEST 1  VIEW COMPARISON:  01/13/2016 FINDINGS: Chronic cardiomegaly. Stable mediastinal contours. Chronic bilateral lung scarring. There is no edema, consolidation, effusion, or pneumothorax. IMPRESSION: 1. No acute finding. 2. Chronic cardiomegaly and lung scarring. Electronically Signed   By: Marnee Spring M.D.   On: 02/02/2016 23:58    Jeoffrey Massed, MD  [facility] Pager:[phone]  If 7PM-7AM, please contact night-coverage [email or web address] Password [email or web address] 02/06/2016, 12:19 PM   LOS: 3 days

## 2016-02-06 NOTE — Interval H&P Note (Signed)
History and Physical Interval Note:  02/06/2016 7:57 AM  Frank Davidson  has presented today for surgery, with the diagnosis of chf  The various methods of treatment have been discussed with the patient and family. After consideration of risks, benefits and other options for treatment, the patient has consented to  Procedure(s): Right Heart Cath (N/A) as a surgical intervention .  The patient's history has been reviewed, patient examined, no change in status, stable for surgery.  I have reviewed the patient's chart and labs.  Questions were answered to the patient's satisfaction.     Ender Rorke, Reuel Boom

## 2016-02-06 NOTE — Progress Notes (Signed)
Advanced Heart Failure Rounding Note  PCP: Dr. Shela Commons. [place] Nephrologist: Dr. Briant Cedar HF: Frank Davidson  Subjective:    Echo 02/04/16 LVEF reduced to 10-15% from 25-30% in 08/2015  Diuresed fairly well on high-dose lasix. Still with some dyspnea.   Cr stable at 3.5. BUN continues to climb.   Objective:   Weight Range: 104.826 kg (231 lb 1.6 oz) Body mass index is 33.16 kg/(m^2).   Vital Signs:   Temp:  [97.2 F (36.2 C)-97.8 F (36.6 C)] 97.8 F (36.6 C) (03/16 2221) Pulse Rate:  [0-140] 47 (03/17 1000) Resp:  [7-34] 18 (03/17 1000) BP: (95-144)/(69-110) 119/83 mmHg (03/17 1000) SpO2:  [0 %-100 %] 92 % (03/17 1000) Last BM Date: 02/05/16  Weight change: Filed Weights   02/03/16 1511 02/04/16 0232 02/05/16 0618  Weight: 102.513 kg (226 lb) 103.239 kg (227 lb 9.6 oz) 104.826 kg (231 lb 1.6 oz)    Intake/Output:   Intake/Output Summary (Last 24 hours) at 02/06/16 1012 Last data filed at 02/06/16 0346  Gross per 24 hour  Intake    392 ml  Output   2525 ml  Net  -2133 ml     Physical Exam: General: Obese, pleasant HEENT: normal Neck: supple. JVP difficult to assess but appears elevated nearly to jaw. Carotids 2+ bilat; no bruits. No thyromegaly or nodule noted. Cor: PMI nondisplaced. irregularly irregular.  Lungs: Slightly diminished bases. Abdomen: Obese, soft, NT, mildly distended, no HSM. No bruits or masses. +BS  Extremities: no cyanosis, clubbing, rash. 1-2+ LE edema, shiny, slightly cool to touch.  Neuro: alert & orientedx3, cranial nerves grossly intact. moves all 4 extremities w/o difficulty. Affect pleasant  Telemetry: Reviewed, afib  Labs: CBC  Recent Labs  02/04/16 0400  WBC 12.8*  HGB 11.2*  HCT 35.7*  MCV 85.8  PLT 169   Basic Metabolic Panel  Recent Labs  02/04/16 0400 02/05/16 0450 02/06/16 0526  NA 133* 130* 134*  K 3.4* 4.2 4.1  CL 88* 90* 91*  CO2 28 24 26   GLUCOSE 189* 121* 89  BUN 127* 142* 149*  CREATININE 3.05*  3.53* 3.51*  CALCIUM 9.5 8.9 9.5  MG 2.5*  --   --    Liver Function Tests No results for input(s): AST, ALT, ALKPHOS, BILITOT, PROT, ALBUMIN in the last 72 hours. No results for input(s): LIPASE, AMYLASE in the last 72 hours. Cardiac Enzymes No results for input(s): CKTOTAL, CKMB, CKMBINDEX, TROPONINI in the last 72 hours.  BNP: BNP (last 3 results)  Recent Labs  12/11/15 2345 01/13/16 1544 02/03/16 0115  BNP 1024.0* 843.5* 750.3*    ProBNP (last 3 results) No results for input(s): PROBNP in the last 8760 hours.   D-Dimer No results for input(s): DDIMER in the last 72 hours. Hemoglobin A1C No results for input(s): HGBA1C in the last 72 hours. Fasting Lipid Panel No results for input(s): CHOL, HDL, LDLCALC, TRIG, CHOLHDL, LDLDIRECT in the last 72 hours. Thyroid Function Tests No results for input(s): TSH, T4TOTAL, T3FREE, THYROIDAB in the last 72 hours.  Invalid input(s): FREET3  Other results:     Imaging/Studies:  No results found.  Latest Echo  Latest Cath   Medications:     Scheduled Medications: . [MAR Hold] allopurinol  100 mg Oral Daily  . [MAR Hold] apixaban  5 mg Oral BID  . [MAR Hold] budesonide (PULMICORT) nebulizer solution  0.25 mg Nebulization BID  . [MAR Hold] carvedilol  12.5 mg Oral BID WC  . [facility] Hold]  docusate sodium  100 mg Oral BID  . [MAR Hold] glipiZIDE  5 mg Oral BID AC  . [MAR Hold] hydrALAZINE  25 mg Oral TID  . [MAR Hold] insulin aspart  0-9 Units Subcutaneous TID WC  . [MAR Hold] ipratropium-albuterol  3 mL Nebulization TID  . [MAR Hold] isosorbide mononitrate  60 mg Oral Daily  . [MAR Hold] metolazone  2.5 mg Oral Once per day on Mon Thu  . [MAR Hold] omega-3 acid ethyl esters  1 g Oral Daily  . [MAR Hold] potassium chloride SA  40 mEq Oral BID  . sodium chloride flush  3 mL Intravenous Q12H    Infusions: . sodium chloride    . furosemide (LASIX) infusion    . milrinone 0.25 mcg/kg/min (02/06/16 0951)    PRN  Medications: sodium chloride, [MAR Hold] acetaminophen **OR** [MAR Hold] acetaminophen, [MAR Hold] albuterol, alum & mag hydroxide-simeth, [MAR Hold] baclofen, [MAR Hold] ondansetron **OR** [MAR Hold] ondansetron (ZOFRAN) IV, [MAR Hold] oxyCODONE-acetaminophen **AND** [MAR Hold] oxyCODONE, sodium chloride flush   Assessment   1. Acute respiratory failure with hypoxia 2. Acute on chronic systolic CHF 3. 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 Chronic Afib 5. HTN 6. DM2 7. OSA - NOT on CPAP.   Plan    Volume status and renal function stable but BUN continues to climb. No uremia. Echo images reviewed personally and EF is now down to 15%. I am concerned about low output HF. RHC today to look for low output. With low EF suspect he will have hard time tolerating HD. D/w Dr. Marisue Humble.   Length of Stay: 3   Arvilla Meres MD  02/06/2016, 10:12 AM  Advanced Heart Failure Team Pager [phone] (M-F; 7a - 4p)  Please contact [facility] for night-coverage after hours (4p -7a ) and weekends on [email or web address]  Patient seen and examined with Frank Saber, PA-C. We discussed all aspects of the encounter. I agree with the assessment and plan as stated above.     Aliena Ghrist,MD 10:12 AM

## 2016-02-06 NOTE — Progress Notes (Addendum)
ANTICOAGULATION CONSULT NOTE - Initial Consult  Pharmacy Consult for heparin Indication: atrial fibrillation  Allergies  Allergen Reactions  . Corlanor [Ivabradine] Palpitations and Other (See Comments)    Kidney and heart problems, syncope  . Entresto [Sacubitril-Valsartan]     Syncope  . Januvia [Sitagliptin] Other (See Comments)    "Almost killed me"   . Lunesta [Eszopiclone] Palpitations and Other (See Comments)    Kidney and heart problems, syncope  . Actos [Pioglitazone] Other (See Comments)    Caused blood in urine and fluid retention     Patient Measurements: Height: 5\' 10"  (177.8 cm) Weight: 226 lb 6.6 oz (102.7 kg) IBW/kg (Calculated) : 73 Heparin Dosing Weight: 94.6  Vital Signs: Temp: 98.1 F (36.7 C) (03/17 1200) BP: 117/74 mmHg (03/17 1200) Pulse Rate: 67 (03/17 1200)  Labs:  Recent Labs  02/04/16 0400 02/05/16 0450 02/06/16 0526  HGB 11.2*  --   --   HCT 35.7*  --   --   PLT 169  --   --   LABPROT  --   --  20.1*  INR  --   --  1.71*  CREATININE 3.05* 3.53* 3.51*    Estimated Creatinine Clearance: 25.2 mL/min (by C-G formula based on Cr of 3.51).   Medical History: Past Medical History  Diagnosis Date  . Atrial fibrillation (HCC)     PAF  . Hypertension   . COPD (chronic obstructive pulmonary disease) (HCC)   . Ocular herpes   . Gout   . CHF (congestive heart failure) (HCC)   . Chronic pain   . Hyperlipidemia   . Nonischemic cardiomyopathy (HCC) Aug 2015    EF 15%  . Ocular herpes   . Neuropathy (HCC)   . Left-sided weakness     "because of the arthritis and edema"  . PVD (peripheral vascular disease) [facility]) May 2015    abnormal ABIs  . Memory impairment     "short term"  . CKD (chronic kidney disease), stage III     Dr. Briant Cedar follows-holding   . Fibromyalgia     neuropathy- hands, more than feet.  . Sleep apnea with use of continuous positive airway pressure (CPAP)     does not use cpap  . GERD (gastroesophageal reflux  disease)     tums as needed  . Asthma   . Type II diabetes mellitus (HCC)   . Stroke [facility]) X 5    last stroke aug 2014"; denies residual on 09/12/2014  . Osteoarthritis   . Arthritis     "knees, right elbow, shoulder" (09/12/2014)  . Shortness of breath dyspnea     Medications:  Scheduled:  . allopurinol  100 mg Oral Daily  . budesonide (PULMICORT) nebulizer solution  0.25 mg Nebulization BID  . carvedilol  12.5 mg Oral BID WC  . docusate sodium  100 mg Oral BID  . glipiZIDE  5 mg Oral BID AC  . hydrALAZINE  25 mg Oral TID  . insulin aspart  0-9 Units Subcutaneous TID WC  . ipratropium-albuterol  3 mL Nebulization TID  . isosorbide mononitrate  60 mg Oral Daily  . metolazone  2.5 mg Oral Once per day on Mon Thu  . omega-3 acid ethyl esters  1 g Oral Daily  . potassium chloride SA  40 mEq Oral BID  . sodium chloride flush  3 mL Intravenous Q12H  . sodium chloride flush  3 mL Intravenous Q12H    Assessment: 65 yo male admitted with  shortness of breath and edema-secondary to decompensated systolic heart failure. Echo 02/04/16 LVEF reduced to 10-15% from 25-30% in 08/2015. Now with cardiorenal syndrome and possible needs for CRRT. Patient also has history of afib and pharmacy is consulted to dose heparin.   Previously taking apixaban  with last dose on 3/17 at 1124. Hgb 11.2, PLT 169, INR today is 1.71.  Goal of Therapy:  Heparin level 0.3-0.7 units/ml  APTT 66-102s Monitor platelets by anticoagulation protocol: Yes   Plan:  - baseline aPTT/HL  - heparin 12 hours after last apixaban dose at 2330 unless aPTT is low (do not expect this given renal function and timing of last dose) - heparin to start at 1400 units/hr no bolus due to elevated INR  - daily HL until aPTT and HL correlate  - aPTT 8 hours after heparin infusion 3/18 0800    Sherron Monday, PharmD Clinical Pharmacy Resident Pager: [phone] 02/06/2016 2:05 PM

## 2016-02-06 NOTE — Progress Notes (Signed)
Admit: 02/02/2016 LOS: 3  6M with AoCKD 2/2 cardiorenal syndrome with acute worsened LVEF and acute on chronic systolic CHF  Subjective:  RHC this AM, low flow, inc b/v failure Transferred to CCU for inotrope and lasix gtt  03/16 0701 - 03/17 0700 In: 692 [P.O.:692] Out: 2675 [Urine:2675]  Filed Weights   02/03/16 1511 02/04/16 0232 02/05/16 0618  Weight: 102.513 kg (226 lb) 103.239 kg (227 lb 9.6 oz) 104.826 kg (231 lb 1.6 oz)    Scheduled Meds: . allopurinol  100 mg Oral Daily  . apixaban  5 mg Oral BID  . budesonide (PULMICORT) nebulizer solution  0.25 mg Nebulization BID  . carvedilol  12.5 mg Oral BID WC  . docusate sodium  100 mg Oral BID  . glipiZIDE  5 mg Oral BID AC  . hydrALAZINE  25 mg Oral TID  . insulin aspart  0-9 Units Subcutaneous TID WC  . ipratropium-albuterol  3 mL Nebulization TID  . isosorbide mononitrate  60 mg Oral Daily  . metolazone  2.5 mg Oral Once per day on Mon Thu  . omega-3 acid ethyl esters  1 g Oral Daily  . potassium chloride SA  40 mEq Oral BID  . sodium chloride flush  3 mL Intravenous Q12H  . sodium chloride flush  3 mL Intravenous Q12H   Continuous Infusions: . furosemide (LASIX) infusion    . milrinone 0.25 mcg/kg/min (02/06/16 0951)   PRN Meds:.sodium chloride, sodium chloride, acetaminophen **OR** acetaminophen, acetaminophen, albuterol, baclofen, ondansetron **OR** ondansetron (ZOFRAN) IV, ondansetron (ZOFRAN) IV, oxyCODONE-acetaminophen **AND** oxyCODONE, sodium chloride flush, sodium chloride flush  Current Labs: reviewed    Physical Exam:  Blood pressure 123/104, pulse 33, temperature 97.8 F (36.6 C), temperature source Oral, resp. rate 25, height 5\' 10"  (1.778 m), weight 104.826 kg (231 lb 1.6 oz), SpO2 98 %. NAD R IJ SGC in place RRR CTAB ant Minimal LEE Foley in place  A 1. AoCKD 2/2 Cardiorenal Syndrome 1. BL SCr low 2s, sees Mattingly at [facility] 2. No outpt HD planning to date 3. Marginal success with diuretics  thus far 4. Progressive Azotemia, K and HCO3 ok 5. Pt willing to rec RRT if indicated 2. A/C systolic CHF 1. Starting inotropes and lasix gtt today 3. COPD, ? Acute exacerbation 4. Paroxysmal AFib on apixaban 5. DM2 6. HTN 7. Obesity BMI 32.5  P 1. Good UOP but markedly azotemic, not uremic currenlty 2. See if milrinone + lasix effective 3. If not, will need CRRT to stablize, this might be an ESRD defining event   Sabra Heck MD 02/06/2016, 10:54 AM   Recent Labs Lab 02/04/16 0400 02/05/16 0450 02/06/16 0526  NA 133* 130* 134*  K 3.4* 4.2 4.1  CL 88* 90* 91*  CO2 28 24 26   GLUCOSE 189* 121* 89  BUN 127* 142* 149*  CREATININE 3.05* 3.53* 3.51*  CALCIUM 9.5 8.9 9.5    Recent Labs Lab 02/03/16 0115 02/03/16 0914 02/04/16 0400  WBC 10.2 8.1 12.8*  NEUTROABS 7.2 7.2  --   HGB 11.8* 12.4* 11.2*  HCT 38.0* 39.8 35.7*  MCV 86.8 87.9 85.8  PLT 207 186 169

## 2016-02-06 NOTE — H&P (View-Only) (Signed)
Advanced Heart Failure Rounding Note  PCP: Dr. Shela Commons. [place] Nephrologist: Dr. Briant Cedar HF: Bensimhon  Subjective:    Echo 02/04/16 LVEF reduced to 10-15% from 25-30% in 08/2015  Some output on lasix but creatinine and weight continue to rise.  He denies SOB or worsening DOE in the past few weeks.   Weight up ~ 4 lbs despite IV lasix yesterday. Creatinine improved 3.32 -> 3.05 -> 3.5. K 4.2. BUN remains markedly elevated at 142.  Objective:   Weight Range: 231 lb 1.6 oz (104.826 kg) Body mass index is 33.16 kg/(m^2).   Vital Signs:   Temp:  [97.3 F (36.3 C)-97.9 F (36.6 C)] 97.6 F (36.4 C) (03/16 0723) Pulse Rate:  [61-111] 70 (03/16 0932) Resp:  [18-20] 18 (03/16 0932) BP: (96-104)/(61-79) 96/73 mmHg (03/16 0932) SpO2:  [95 %-100 %] 96 % (03/16 0936) Weight:  [231 lb 1.6 oz (104.826 kg)] 231 lb 1.6 oz (104.826 kg) (03/16 0618) Last BM Date: 02/05/16  Weight change: Filed Weights   02/03/16 1511 02/04/16 0232 02/05/16 0618  Weight: 226 lb (102.513 kg) 227 lb 9.6 oz (103.239 kg) 231 lb 1.6 oz (104.826 kg)    Intake/Output:   Intake/Output Summary (Last 24 hours) at 02/05/16 0956 Last data filed at 02/05/16 0900  Gross per 24 hour  Intake   1060 ml  Output   1300 ml  Net   -240 ml     Physical Exam: General: Obese, pleasant HEENT: normal Neck: supple. JVP difficult to assess but appears elevated nearly to jaw. Carotids 2+ bilat; no bruits. No thyromegaly or nodule noted. Cor: PMI nondisplaced. irregularly irregular.  Lungs: Slightly diminished bases. Abdomen: Obese, soft, NT, mildly distended, no HSM. No bruits or masses. +BS  Extremities: no cyanosis, clubbing, rash. 1-2+ LE edema, shiny, slightly cool to touch.  Neuro: alert & orientedx3, cranial nerves grossly intact. moves all 4 extremities w/o difficulty. Affect pleasant  Telemetry: Reviewed, afib  Labs: CBC  Recent Labs  02/03/16 0115 02/03/16 0914 02/04/16 0400  WBC 10.2 8.1 12.8*    NEUTROABS 7.2 7.2  --   HGB 11.8* 12.4* 11.2*  HCT 38.0* 39.8 35.7*  MCV 86.8 87.9 85.8  PLT 207 186 169   Basic Metabolic Panel  Recent Labs  02/04/16 0400 02/05/16 0450  NA 133* 130*  K 3.4* 4.2  CL 88* 90*  CO2 28 24  GLUCOSE 189* 121*  BUN 127* 142*  CREATININE 3.05* 3.53*  CALCIUM 9.5 8.9  MG 2.5*  --    Liver Function Tests  Recent Labs  02/03/16 0914  AST 30  ALT 18  ALKPHOS 66  BILITOT 1.1  PROT 8.4*  ALBUMIN 4.1   No results for input(s): LIPASE, AMYLASE in the last 72 hours. Cardiac Enzymes  Recent Labs  02/03/16 0914  TROPONINI 0.12*    BNP: BNP (last 3 results)  Recent Labs  12/11/15 2345 01/13/16 1544 02/03/16 0115  BNP 1024.0* 843.5* 750.3*    ProBNP (last 3 results) No results for input(s): PROBNP in the last 8760 hours.   D-Dimer No results for input(s): DDIMER in the last 72 hours. Hemoglobin A1C No results for input(s): HGBA1C in the last 72 hours. Fasting Lipid Panel No results for input(s): CHOL, HDL, LDLCALC, TRIG, CHOLHDL, LDLDIRECT in the last 72 hours. Thyroid Function Tests No results for input(s): TSH, T4TOTAL, T3FREE, THYROIDAB in the last 72 hours.  Invalid input(s): FREET3  Other results:     Imaging/Studies:  No results found.  Latest Echo  Latest Cath   Medications:     Scheduled Medications: . allopurinol  100 mg Oral Daily  . apixaban  5 mg Oral BID  . budesonide (PULMICORT) nebulizer solution  0.25 mg Nebulization BID  . carvedilol  12.5 mg Oral BID WC  . docusate sodium  100 mg Oral BID  . furosemide  80 mg Intravenous BID  . glipiZIDE  5 mg Oral BID AC  . hydrALAZINE  25 mg Oral TID  . insulin aspart  0-9 Units Subcutaneous TID WC  . ipratropium-albuterol  3 mL Nebulization Q6H  . isosorbide mononitrate  60 mg Oral Daily  . metolazone  2.5 mg Oral Once per day on Mon Thu  . omega-3 acid ethyl esters  1 g Oral Daily  . potassium chloride SA  40 mEq Oral BID    Infusions:     PRN Medications: acetaminophen **OR** acetaminophen, albuterol, baclofen, ondansetron **OR** ondansetron (ZOFRAN) IV, oxyCODONE-acetaminophen **AND** oxyCODONE   Assessment   1. Acute respiratory failure with hypoxia 2. Acute on chronic systolic CHF 3. 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 Chronic Afib 5. HTN 6. DM2 7. OSA - NOT on CPAP.   Plan    He remains volume overloaded. Will continue IV lasix for now with renal consult pending.   With even further reduced EF, do not think he would tolerate HD very well.   Will put in for RHC tomorrow morning to look at cardiac output and pressures.    Length of Stay: 2   Graciella Freer PA-C 02/05/2016, 9:56 AM  Advanced Heart Failure Team Pager [phone] (M-F; 7a - 4p)  Please contact [facility] for night-coverage after hours (4p -7a ) and weekends on [email or web address]  Patient seen and examined with Otilio Saber, PA-C. We discussed all aspects of the encounter. I agree with the assessment and plan as stated above.   Volume status and renal function getting worse. Not responding to IV lasix. Echo images reviewed personally and EF is now down to 15%. I am concerned about low output HF. Will plan RHC in the am. With low EF suspect he will have hard time tolerating HD.   Bensimhon, Daniel,MD 5:26 PM

## 2016-02-07 ENCOUNTER — Inpatient Hospital Stay (HOSPITAL_COMMUNITY): Payer: Commercial Managed Care - HMO

## 2016-02-07 LAB — GLUCOSE, CAPILLARY
Glucose-Capillary: 89 mg/dL (ref 65–99)
Glucose-Capillary: 131 mg/dL — ABNORMAL HIGH (ref 65–99)
Glucose-Capillary: 108 mg/dL — ABNORMAL HIGH (ref 65–99)

## 2016-02-07 LAB — BASIC METABOLIC PANEL
ANION GAP: 14 (ref 5–15)
Anion gap: 11 (ref 5–15)
BUN: 119 mg/dL — ABNORMAL HIGH (ref 6–20)
BUN: 109 mg/dL — AB (ref 6–20)
CALCIUM: 9.4 mg/dL (ref 8.9–10.3)
CHLORIDE: 91 mmol/L — AB (ref 101–111)
CO2: 33 mmol/L — AB (ref 22–32)
CO2: 35 mmol/L — ABNORMAL HIGH (ref 22–32)
CREATININE: 2.56 mg/dL — AB (ref 0.61–1.24)
Calcium: 9.5 mg/dL (ref 8.9–10.3)
Chloride: 92 mmol/L — ABNORMAL LOW (ref 101–111)
Creatinine, Ser: 2.6 mg/dL — ABNORMAL HIGH (ref 0.61–1.24)
GFR calc Af Amer: 28 mL/min — ABNORMAL LOW (ref >60)
GFR calc non Af Amer: 24 mL/min — ABNORMAL LOW (ref >60)
GFR, EST AFRICAN AMERICAN: 29 mL/min — AB (ref >60)
GFR, EST NON AFRICAN AMERICAN: 25 mL/min — AB (ref >60)
GLUCOSE: 108 mg/dL — AB (ref 65–99)
Glucose, Bld: 109 mg/dL — ABNORMAL HIGH (ref 65–99)
POTASSIUM: 3.2 mmol/L — AB (ref 3.5–5.1)
Potassium: 3.3 mmol/L — ABNORMAL LOW (ref 3.5–5.1)
SODIUM: 139 mmol/L (ref 135–145)
Sodium: 137 mmol/L (ref 135–145)

## 2016-02-07 LAB — APTT
APTT: 134 s — AB (ref 24–37)
aPTT: 98 seconds — ABNORMAL HIGH (ref 24–37)

## 2016-02-07 LAB — CARBOXYHEMOGLOBIN
CARBOXYHEMOGLOBIN: 1.6 % — AB (ref 0.5–1.5)
METHEMOGLOBIN: 1 % (ref 0.0–1.5)
O2 SAT: 55.7 %
Total hemoglobin: 12.4 g/dL — ABNORMAL LOW (ref 13.5–18.0)

## 2016-02-07 MED ORDER — SODIUM CHLORIDE 0.9% FLUSH
10.0000 mL | Freq: Two times a day (BID) | INTRAVENOUS | Status: DC
  Administered 2016-02-07: 10 mL
  Administered 2016-02-09 (×2): 20 mL
  Administered 2016-02-10 – 2016-02-11 (×3): 10 mL
  Administered 2016-02-12: 20 mL

## 2016-02-07 MED ORDER — HYDRALAZINE HCL 25 MG PO TABS
37.5000 mg | ORAL_TABLET | Freq: Three times a day (TID) | ORAL | Status: DC
  Administered 2016-02-07 – 2016-02-09 (×8): 37.5 mg via ORAL
  Filled 2016-02-07 (×9): qty 2

## 2016-02-07 MED ORDER — SODIUM CHLORIDE 0.9% FLUSH: 10.0000 mL | INTRAVENOUS | Status: DC | PRN

## 2016-02-07 MED ORDER — POTASSIUM CHLORIDE CRYS ER 20 MEQ PO TBCR
40.0000 meq | EXTENDED_RELEASE_TABLET | Freq: Once | ORAL | Status: AC
  Administered 2016-02-07: 40 meq via ORAL
  Filled 2016-02-07 (×2): qty 2

## 2016-02-07 MED ORDER — CARVEDILOL 6.25 MG PO TABS
6.2500 mg | ORAL_TABLET | Freq: Two times a day (BID) | ORAL | Status: DC
  Administered 2016-02-07: 6.25 mg via ORAL
  Filled 2016-02-07: qty 1

## 2016-02-07 NOTE — Progress Notes (Signed)
ANTICOAGULATION CONSULT NOTE - Follow Up Consult  Pharmacy Consult for heparin Indication: atrial fibrillation  Allergies  Allergen Reactions  . Corlanor [Ivabradine] Palpitations and Other (See Comments)    Kidney and heart problems, syncope  . Entresto [Sacubitril-Valsartan]     Syncope  . Januvia [Sitagliptin] Other (See Comments)    "Almost killed me"   . Lunesta [Eszopiclone] Palpitations and Other (See Comments)    Kidney and heart problems, syncope  . Actos [Pioglitazone] Other (See Comments)    Caused blood in urine and fluid retention     Patient Measurements: Height: 5\' 10"  (177.8 cm) Weight: 220 lb 7.4 oz (100 kg) IBW/kg (Calculated) : 73 Heparin Dosing Weight: 94 kg  Vital Signs: Temp: 96.6 F (35.9 C) (03/18 0700) Temp Source: Core (Comment) (03/18 0400) BP: 114/80 mmHg (03/18 0700) Pulse Rate: 58 (03/18 0700)  Labs:  Recent Labs  02/05/16 0450 02/06/16 0526 02/06/16 1558 02/07/16 0437  APTT  --   --  45*  --   LABPROT  --  20.1*  --   --   INR  --  1.71*  --   --   HEPARINUNFRC  --   --  >2.20*  --   CREATININE 3.53* 3.51*  --  2.56*    Estimated Creatinine Clearance: 34.1 mL/min (by C-G formula based on Cr of 2.56).  Assessment: 65 yo M admitted 02/02/2016 with SOB and edema. Echo on 3/15 shows reduced LVEF of 10-15% from 25-30% in 08/2015. Patient now has cardiorenal syndrome and possible need for CRRT. Patient is on Eliquis PTA for Afib with last dose on 3/17 at 1124. Hep gtt started 12 hrs after last dose of apixaban. Will dose off aPTTs until correlating with HL.  APTT 134 (supratherapeutic), HL >2.20 (due to Eliquis), Last CBC 3/15 Hgb 11.2, Plt wnl. No s/sx of bleeding noted.  Goal of Therapy:  Heparin level 0.3-0.7 units/ml  aPTT 66-102s Monitor platelets by anticoagulation protocol: Yes   Plan:  - Decrease heparin gtt to 1250 units/hr - Check 6hr aPTT - Monitor daily aPTT, HL, CBC and s/sx of bleeding  Casilda Carls, PharmD. PGY-1  Pharmacy Resident Pager: [phone] 02/07/2016,12:04 PM

## 2016-02-07 NOTE — Progress Notes (Signed)
Admit: 02/02/2016 LOS: 4  65M with AoCKD 2/2 cardiorenal syndrome with acute worsened LVEF and acute on chronic systolic CHF  Subjective:  Wow, UOP 8L and renal labs improved.   Now on dobutamine + lasix Feels well   03/17 0701 - 03/18 0700 In: 948.4 [P.O.:320; I.V.:628.4] Out: 7950 [Urine:7950]  Filed Weights   02/05/16 0618 02/06/16 1100 02/07/16 0440  Weight: 104.826 kg (231 lb 1.6 oz) 102.7 kg (226 lb 6.6 oz) 100 kg (220 lb 7.4 oz)    Scheduled Meds: . allopurinol  100 mg Oral Daily  . budesonide (PULMICORT) nebulizer solution  0.25 mg Nebulization BID  . carvedilol  12.5 mg Oral BID WC  . docusate sodium  100 mg Oral BID  . glipiZIDE  5 mg Oral BID AC  . hydrALAZINE  25 mg Oral TID  . insulin aspart  0-9 Units Subcutaneous TID WC  . ipratropium-albuterol  3 mL Nebulization TID  . isosorbide mononitrate  60 mg Oral Daily  . metolazone  2.5 mg Oral Once per day on Mon Thu  . omega-3 acid ethyl esters  1 g Oral Daily  . potassium chloride SA  40 mEq Oral BID  . sodium chloride flush  3 mL Intravenous Q12H  . sodium chloride flush  3 mL Intravenous Q12H   Continuous Infusions: . DOBUTamine 5 mcg/kg/min (02/06/16 1530)  . furosemide (LASIX) infusion 20 mg/hr (02/06/16 2324)  . heparin 1,400 Units/hr (02/06/16 2324)   PRN Meds:.sodium chloride, sodium chloride, acetaminophen **OR** acetaminophen, acetaminophen, albuterol, baclofen, ondansetron **OR** ondansetron (ZOFRAN) IV, ondansetron (ZOFRAN) IV, oxyCODONE-acetaminophen **AND** oxyCODONE, sodium chloride flush, sodium chloride flush  Current Labs: reviewed    Physical Exam:  Blood pressure 114/80, pulse 58, temperature 96.6 F (35.9 C), temperature source Core (Comment), resp. rate 13, height 5\' 10"  (1.778 m), weight 100 kg (220 lb 7.4 oz), SpO2 95 %. NAD R IJ SGC in place RRR CTAB ant Minimal LEE Foley in place  A 1. AoCKD 2/2 Cardiorenal Syndrome 1. BL SCr low 2s, sees Mattingly at [facility] 2. No outpt HD  planning to date 3. Improving with inotropes 4. Pt willing to rec RRT if indicated 2. A/C systolic CHF 1. Very responsive to dobutamine and lasix gtt  3. COPD, ? Acute exacerbation 4. Paroxysmal AFib on apixaban 5. DM2 6. HTN 7. Obesity BMI 32.5 8. Hypokalemia  P 1. Reduce lasix gtt this AM to 10mg /hr given response 2. Cont BID K Cl 3. BID labs with this diuresis  Sabra Heck MD 02/07/2016, 8:16 AM   Recent Labs Lab 02/05/16 0450 02/06/16 0526 02/07/16 0437  NA 130* 134* 139  K 4.2 4.1 3.3*  CL 90* 91* 92*  CO2 24 26 33*  GLUCOSE 121* 89 109*  BUN 142* 149* 119*  CREATININE 3.53* 3.51* 2.56*  CALCIUM 8.9 9.5 9.4    Recent Labs Lab 02/03/16 0115 02/03/16 0914 02/04/16 0400  WBC 10.2 8.1 12.8*  NEUTROABS 7.2 7.2  --   HGB 11.8* 12.4* 11.2*  HCT 38.0* 39.8 35.7*  MCV 86.8 87.9 85.8  PLT 207 186 169

## 2016-02-07 NOTE — Progress Notes (Signed)
Patient ID: Frank Davidson, male   DOB: April 29, 1951, 65 y.o.   MRN: [id number]     Advanced Heart Failure Rounding Note  PCP: Dr. Shela Commons. [place] Nephrologist: Dr. Briant Cedar HF: Bensimhon  Subjective:    Echo 02/04/16 LVEF reduced to 10-15% from 25-30% in 08/2015  RHC (3/17): RA mean 16 PA 70/31 mean 47 PCWP mean 25 CI 1.6  [place] numbers this morning: RA mean 11 PA 56/35 PCWP mean 21 CI 2.05 Co-ox 56%  Milrinone stopped 3/17 and dobutamine 5 mcg/kg/min begun.  Cardiac output somewhat improved today.  He diuresed very well yesterday, filling pressures better. Feels good. BUN still very high but overall BUN/creatinine better today.    Objective:   Weight Range: 220 lb 7.4 oz (100 kg) Body mass index is 31.63 kg/(m^2).   Vital Signs:   Temp:  [96.6 F (35.9 C)-98.1 F (36.7 C)] 96.6 F (35.9 C) (03/18 0700) Pulse Rate:  [33-108] 58 (03/18 0700) Resp:  [13-36] 13 (03/18 0700) BP: (94-147)/(65-104) 114/80 mmHg (03/18 0700) SpO2:  [88 %-100 %] 95 % (03/18 0700) Weight:  [220 lb 7.4 oz (100 kg)-226 lb 6.6 oz (102.7 kg)] 220 lb 7.4 oz (100 kg) (03/18 0440) Last BM Date: 02/05/16  Weight change: Filed Weights   02/05/16 0618 02/06/16 1100 02/07/16 0440  Weight: 231 lb 1.6 oz (104.826 kg) 226 lb 6.6 oz (102.7 kg) 220 lb 7.4 oz (100 kg)    Intake/Output:   Intake/Output Summary (Last 24 hours) at 02/07/16 0858 Last data filed at 02/07/16 0600  Gross per 24 hour  Intake 948.36 ml  Output   7950 ml  Net -7001.64 ml     Physical Exam: General: Obese, pleasant HEENT: normal Neck: supple. JVP 9-10 cm. Carotids 2+ bilat; no bruits. No thyromegaly or nodule noted. Cor: PMI nondisplaced. irregularly irregular.  Lungs: Slightly diminished bases. Abdomen: Obese, soft, NT, mildly distended, no HSM. No bruits or masses. +BS  Extremities: no cyanosis, clubbing, rash. No edema.   Neuro: alert & orientedx3, cranial nerves grossly intact. moves all 4 extremities w/o difficulty.  Affect pleasant  Telemetry: Reviewed, afib in 70s  Labs: CBC No results for input(s): WBC, NEUTROABS, HGB, HCT, MCV, PLT in the last 72 hours. Basic Metabolic Panel  Recent Labs  02/06/16 0526 02/07/16 0437  NA 134* 139  K 4.1 3.3*  CL 91* 92*  CO2 26 33*  GLUCOSE 89 109*  BUN 149* 119*  CREATININE 3.51* 2.56*  CALCIUM 9.5 9.4   Liver Function Tests No results for input(s): AST, ALT, ALKPHOS, BILITOT, PROT, ALBUMIN in the last 72 hours. No results for input(s): LIPASE, AMYLASE in the last 72 hours. Cardiac Enzymes No results for input(s): CKTOTAL, CKMB, CKMBINDEX, TROPONINI in the last 72 hours.  BNP: BNP (last 3 results)  Recent Labs  12/11/15 2345 01/13/16 1544 02/03/16 0115  BNP 1024.0* 843.5* 750.3*    ProBNP (last 3 results) No results for input(s): PROBNP in the last 8760 hours.   D-Dimer No results for input(s): DDIMER in the last 72 hours. Hemoglobin A1C No results for input(s): HGBA1C in the last 72 hours. Fasting Lipid Panel No results for input(s): CHOL, HDL, LDLCALC, TRIG, CHOLHDL, LDLDIRECT in the last 72 hours. Thyroid Function Tests No results for input(s): TSH, T4TOTAL, T3FREE, THYROIDAB in the last 72 hours.  Invalid input(s): FREET3  Other results:     Imaging/Studies:  No results found.  Latest Echo  Latest Cath   Medications:     Scheduled Medications: .  allopurinol  100 mg Oral Daily  . budesonide (PULMICORT) nebulizer solution  0.25 mg Nebulization BID  . carvedilol  12.5 mg Oral BID WC  . docusate sodium  100 mg Oral BID  . glipiZIDE  5 mg Oral BID AC  . hydrALAZINE  25 mg Oral TID  . insulin aspart  0-9 Units Subcutaneous TID WC  . ipratropium-albuterol  3 mL Nebulization TID  . isosorbide mononitrate  60 mg Oral Daily  . metolazone  2.5 mg Oral Once per day on Mon Thu  . omega-3 acid ethyl esters  1 g Oral Daily  . potassium chloride SA  40 mEq Oral BID  . sodium chloride flush  3 mL Intravenous Q12H  .  sodium chloride flush  3 mL Intravenous Q12H    Infusions: . DOBUTamine 5 mcg/kg/min (02/06/16 1530)  . furosemide (LASIX) infusion 20 mg/hr (02/06/16 2324)  . heparin 1,400 Units/hr (02/06/16 2324)    PRN Medications: sodium chloride, sodium chloride, acetaminophen **OR** acetaminophen, acetaminophen, albuterol, baclofen, ondansetron **OR** ondansetron (ZOFRAN) IV, ondansetron (ZOFRAN) IV, oxyCODONE-acetaminophen **AND** oxyCODONE, sodium chloride flush, sodium chloride flush   Assessment   1. Acute respiratory failure with hypoxia 2. Acute on chronic systolic CHF with cardiogenic shock, now on dobutamine.  3. 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 Chronic Afib 5. HTN 6. DM2 7. OSA: does not use CPAP.   Plan    He is now on dobutamine at 5, cardiac output better.  Very good diuresis yesterday, filling pressures improved.  - Agree with decrease in Lasix gtt to 10 mg/hr today.  - With dobutamine use and low output, will drop Coreg to 6.25 mg bid (HR stable in chronic atrial fibrillation).  - Increase hydralazine to 37.5 tid and Imdur to 60 daily.   Continue heparin gtt for now for atrial fibrillation, will eventually need warfarin.  Will not start warfarin yet in case needs future procedure (HD access).   BUN/creatinine improved but still markedly azotemic.  This is going to be difficult situation, may be nearing ESRD but HD will be very difficult if he continues to require inotropes.  Appreciate nephrology assistance.   Hypokalemic, replete and repeat BMET this afternoon.   35 minutes critical care time.   Length of Stay: 4  Marca Ancona MD  02/07/2016, 8:58 AM  Advanced Heart Failure Team Pager [phone] (M-F; 7a - 4p)  Please contact [facility] for night-coverage after hours (4p -7a ) and weekends on [email or web address]

## 2016-02-07 NOTE — Progress Notes (Signed)
ANTICOAGULATION CONSULT NOTE - Follow Up Consult  Pharmacy Consult for Heparin Indication: atrial fibrillation  Allergies  Allergen Reactions  . Corlanor [Ivabradine] Palpitations and Other (See Comments)    Kidney and heart problems, syncope  . Entresto [Sacubitril-Valsartan]     Syncope  . Januvia [Sitagliptin] Other (See Comments)    "Almost killed me"   . Lunesta [Eszopiclone] Palpitations and Other (See Comments)    Kidney and heart problems, syncope  . Actos [Pioglitazone] Other (See Comments)    Caused blood in urine and fluid retention     Patient Measurements: Height: 5\' 10"  (177.8 cm) Weight: 220 lb 7.4 oz (100 kg) IBW/kg (Calculated) : 73 Heparin Dosing Weight: 94 kg  Vital Signs: Temp: 96.8 F (36 C) (03/18 1600) Temp Source: Core (Comment) (03/18 1200) BP: 116/76 mmHg (03/18 1600) Pulse Rate: 71 (03/18 1600)  Labs:  Recent Labs  02/06/16 0526 02/06/16 1558 02/07/16 0437 02/07/16 1141 02/07/16 1638 02/07/16 1856  APTT  --  45*  --  134*  --  98*  LABPROT 20.1*  --   --   --   --   --   INR 1.71*  --   --   --   --   --   HEPARINUNFRC  --  >2.20*  --   --   --   --   CREATININE 3.51*  --  2.56*  --  2.60*  --     Estimated Creatinine Clearance: 33.6 mL/min (by C-G formula based on Cr of 2.6).  Assessment:  AC/Heme: Patient has afib CHADSVASC at least 3. Apix 5mg  bid for PAF (pta) . Now on heparin and will dose off aPTTs.   --PM: aPTT 98 now in goal range.  Goal of Therapy:  aPTT 66-102 Monitor platelets by anticoagulation protocol: Yes   Plan:  Continue IV heparin at 1250 units/hr Next HL and CBC in AM.   Arthea Nobel S. Merilynn Finland, PharmD, BCPS Clinical Staff Pharmacist Pager [phone]  Misty Stanley Stillinger 02/07/2016,7:34 PM

## 2016-02-07 NOTE — Plan of Care (Signed)
On Call  Cardiology   Called by nurse reporting that patient is a little confused. He is on heparin and there is a concern for intracranial bleeding.   On exam, his vitals are stable on current regimen of Dobut. A Swan cath is in placed. He is significant renal failure although BUN is trending down. No focal weakness but he reports dizziness which gets worse with head movement. Oriented to person and place.   Will get a CT head w.o contrast to rule out intracranial bleeding.  Nevin Bloodgood, MD

## 2016-02-08 DIAGNOSIS — I482 Chronic atrial fibrillation: Secondary | ICD-10-CM

## 2016-02-08 LAB — CBC
HEMATOCRIT: 38.5 % — AB (ref 39.0–52.0)
Hemoglobin: 12.3 g/dL — ABNORMAL LOW (ref 13.0–17.0)
MCH: 27.3 pg (ref 26.0–34.0)
MCHC: 31.9 g/dL (ref 30.0–36.0)
MCV: 85.4 fL (ref 78.0–100.0)
Platelets: 139 10*3/uL — ABNORMAL LOW (ref 150–400)
RBC: 4.51 MIL/uL (ref 4.22–5.81)
RDW: 17.7 % — AB (ref 11.5–15.5)
WBC: 12.5 10*3/uL — ABNORMAL HIGH (ref 4.0–10.5)

## 2016-02-08 LAB — BASIC METABOLIC PANEL
Anion gap: 15 (ref 5–15)
Anion gap: 13 (ref 5–15)
BUN: 96 mg/dL — AB (ref 6–20)
BUN: 82 mg/dL — AB (ref 6–20)
CHLORIDE: 89 mmol/L — AB (ref 101–111)
CO2: 33 mmol/L — ABNORMAL HIGH (ref 22–32)
CO2: 34 mmol/L — ABNORMAL HIGH (ref 22–32)
CREATININE: 2.26 mg/dL — AB (ref 0.61–1.24)
Calcium: 9.8 mg/dL (ref 8.9–10.3)
Calcium: 9.9 mg/dL (ref 8.9–10.3)
Chloride: 93 mmol/L — ABNORMAL LOW (ref 101–111)
Creatinine, Ser: 2.09 mg/dL — ABNORMAL HIGH (ref 0.61–1.24)
GFR calc Af Amer: 33 mL/min — ABNORMAL LOW (ref >60)
GFR calc Af Amer: 37 mL/min — ABNORMAL LOW (ref >60)
GFR, EST NON AFRICAN AMERICAN: 29 mL/min — AB (ref >60)
GFR, EST NON AFRICAN AMERICAN: 32 mL/min — AB (ref >60)
GLUCOSE: 96 mg/dL (ref 65–99)
GLUCOSE: 103 mg/dL — AB (ref 65–99)
POTASSIUM: 4 mmol/L (ref 3.5–5.1)
Potassium: 3.4 mmol/L — ABNORMAL LOW (ref 3.5–5.1)
SODIUM: 137 mmol/L (ref 135–145)
Sodium: 140 mmol/L (ref 135–145)

## 2016-02-08 LAB — GLUCOSE, CAPILLARY
GLUCOSE-CAPILLARY: 111 mg/dL — AB (ref 65–99)
GLUCOSE-CAPILLARY: 101 mg/dL — AB (ref 65–99)
Glucose-Capillary: 89 mg/dL (ref 65–99)
Glucose-Capillary: 105 mg/dL — ABNORMAL HIGH (ref 65–99)
Glucose-Capillary: 90 mg/dL (ref 65–99)

## 2016-02-08 LAB — CARBOXYHEMOGLOBIN
Carboxyhemoglobin: 1.7 % — ABNORMAL HIGH (ref 0.5–1.5)
METHEMOGLOBIN: 0.8 % (ref 0.0–1.5)
O2 SAT: 53.5 %
TOTAL HEMOGLOBIN: 13.9 g/dL (ref 13.5–18.0)

## 2016-02-08 LAB — HEPARIN LEVEL (UNFRACTIONATED)

## 2016-02-08 LAB — APTT
APTT: 115 s — AB (ref 24–37)
APTT: 87 s — AB (ref 24–37)

## 2016-02-08 MED ORDER — POTASSIUM CHLORIDE CRYS ER 20 MEQ PO TBCR
20.0000 meq | EXTENDED_RELEASE_TABLET | Freq: Once | ORAL | Status: AC
  Administered 2016-02-08: 20 meq via ORAL
  Filled 2016-02-08: qty 1

## 2016-02-08 MED ORDER — LORAZEPAM 2 MG/ML IJ SOLN
0.5000 mg | Freq: Once | INTRAMUSCULAR | Status: AC
  Administered 2016-02-08: 0.5 mg via INTRAVENOUS
  Filled 2016-02-08: qty 1

## 2016-02-08 NOTE — Progress Notes (Addendum)
ANTICOAGULATION CONSULT NOTE - Follow Up Consult  Pharmacy Consult for heparin Indication: atrial fibrillation  Allergies  Allergen Reactions  . Corlanor [Ivabradine] Palpitations and Other (See Comments)    Kidney and heart problems, syncope  . Entresto [Sacubitril-Valsartan]     Syncope  . Januvia [Sitagliptin] Other (See Comments)    "Almost killed me"   . Lunesta [Eszopiclone] Palpitations and Other (See Comments)    Kidney and heart problems, syncope  . Actos [Pioglitazone] Other (See Comments)    Caused blood in urine and fluid retention     Patient Measurements: Height: 5\' 10"  (177.8 cm) Weight: 215 lb 13.3 oz (97.9 kg) IBW/kg (Calculated) : 73 Heparin Dosing Weight: 94 kg  Vital Signs: Temp: 96.3 F (35.7 C) (03/19 0600) Temp Source: Axillary (03/19 0400) BP: 115/80 mmHg (03/19 0600) Pulse Rate: 72 (03/19 0600)  Labs:  Recent Labs  02/06/16 0526  02/06/16 1558 02/07/16 0437 02/07/16 1141 02/07/16 1638 02/07/16 1856 02/08/16 0340  HGB  --   --   --   --   --   --   --  12.3*  HCT  --   --   --   --   --   --   --  38.5*  PLT  --   --   --   --   --   --   --  139*  APTT  --   < > 45*  --  134*  --  98* 115*  LABPROT 20.1*  --   --   --   --   --   --   --   INR 1.71*  --   --   --   --   --   --   --   HEPARINUNFRC  --   --  >2.20*  --   --   --   --  >2.20*  CREATININE 3.51*  --   --  2.56*  --  2.60*  --  2.26*  < > = values in this interval not displayed.  Estimated Creatinine Clearance: 38.3 mL/min (by C-G formula based on Cr of 2.26).  Assessment: 65 yo M admitted 02/02/2016 with SOB and edema. Echo on 3/15 shows reduced LVEF of 10-15% from 25-30% in 08/2015. Patient now has cardiorenal syndrome and possible need for CRRT. Patient is on Eliquis PTA for Afib with last dose on 3/17 at 1124. Hep gtt started 12 hrs after last dose of apixaban. Will dose off aPTTs until correlating with HL.   aPTT 87 (therapeutic), HL >2.20 (due to Eliquis), Hgb 12.3,  Plt 139. No s/sx of bleeding noted.  Goal of Therapy:  Heparin level 0.3-0.7 units/ml  aPTT 66-102s Monitor platelets by anticoagulation protocol: Yes   Plan:  - Continue heparin at 1150 units/hr - Monitor daily aPTT, HL, CBC and s/sx of bleeding  Casilda Carls, PharmD. PGY-1 Pharmacy Resident Pager: [phone] 02/08/2016,3:42 PM

## 2016-02-08 NOTE — Progress Notes (Signed)
Spoke to Dr. Tarri Glenn in regards to patients behavior and confusion. Order received for 1x dose of ativan. Will continue to monitor.

## 2016-02-08 NOTE — Plan of Care (Signed)
Problem: Physical Regulation: Goal: Ability to maintain clinical measurements within normal limits will improve Outcome: Not Progressing Cardiac output and cardiac index remain abnormal, but urine output and CVP improved.  Problem: Activity: Goal: Risk for activity intolerance will decrease Outcome: Not Progressing Patient sleepy and disinterested in increasing mobility.  Problem: Fluid Volume: Goal: Ability to maintain a balanced intake and output will improve Outcome: Not Progressing Patient appetite low.

## 2016-02-08 NOTE — Progress Notes (Signed)
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 allopurinol  100 mg Oral Daily  . budesonide (PULMICORT) nebulizer solution  0.25 mg Nebulization BID  . carvedilol  6.25 mg Oral BID WC  . docusate sodium  100 mg Oral BID  . glipiZIDE  5 mg Oral BID AC  . hydrALAZINE  37.5 mg Oral TID  . insulin aspart  0-9 Units Subcutaneous TID WC  . ipratropium-albuterol  3 mL Nebulization TID  . isosorbide mononitrate  60 mg Oral Daily  . metolazone  2.5 mg Oral Once per day on Mon Thu  . omega-3 acid ethyl esters  1 g Oral Daily  . potassium chloride SA  40 mEq Oral BID  . sodium chloride flush  10-40 mL Intracatheter Q12H  . sodium chloride flush  3 mL Intravenous Q12H  . sodium chloride flush  3 mL Intravenous Q12H   Continuous Infusions: . DOBUTamine 5 mcg/kg/min (02/07/16 2245)  . furosemide (LASIX) infusion 10 mg/hr (02/07/16 1757)  . heparin 1,250 Units/hr (02/07/16 1637)   PRN Meds:.sodium chloride, sodium chloride, acetaminophen **OR** acetaminophen, acetaminophen, albuterol, baclofen, ondansetron **OR** ondansetron (ZOFRAN) IV, ondansetron (ZOFRAN) IV, oxyCODONE-acetaminophen **AND** oxyCODONE, sodium chloride flush, sodium chloride flush, sodium chloride flush  Current Labs: reviewed    Physical Exam:  Blood pressure 115/80, pulse 72, temperature 96.3 F (35.7 C), temperature source Axillary, resp. rate 17, height 5\' 10"  (1.778 m), weight 97.9 kg (215 lb 13.3 oz), SpO2  100 %. NAD R IJ SGC in place RRR CTAB ant Minimal LEE Foley in place  A 1. AoCKD 2/2 Cardiorenal Syndrome 1. BL SCr low 2s, sees Matting[facility] at [facility] 2. No outpt HD planning to date 3. Improving with inotropes and gtt lasix; nearling prev baseline 4. Pt willing to rec RRT if indicated 2. A/C systolic CHF 1. Very responsive to dobutamine and lasix gtt  3. COPD, ? Acute exacerbation 4. Paroxysmal AFib on apixaban 5. DM2 6. HTN 7. Obesity BMI 32.5 8. Hypokalemia  P 1. Very impressed with response, cont another day vs transition back to bolus lasix, will let AHF weigh in 2. Cont BID K Cl 3. BID labs with this diuresis  Sabra Heck MD 02/08/2016, 7:57 AM   Recent Labs Lab 02/07/16 0437 02/07/16 1638 02/08/16 0340  NA 139 137 137  K 3.3* 3.2* 3.4*  CL 92* 91* 89*  CO2 33* 35* 33*  GLUCOSE 109* 108* 96  BUN 119* 109* 96*  CREATININE 2.56* 2.60* 2.26*  CALCIUM 9.4 9.5 9.8    Recent Labs Lab 02/03/16 0115 02/03/16 0914 02/04/16 0400 02/08/16 0340  WBC 10.2 8.1 12.8* 12.5*  NEUTROABS 7.2 7.2  --   --   HGB 11.8* 12.4* 11.2* 12.3*  HCT 38.0* 39.8 35.7* 38.5*  MCV 86.8 87.9 85.8 85.4  PLT 207 186 169 139*

## 2016-02-08 NOTE — Progress Notes (Signed)
Spoke with pt's niece, Bobby Rumpf regarding pt's change in mental status.  Pt lives with this niece and his sister.  Ms. Teresa Coombs stated that he does occasionally get a little confused at home, especially when he is sick or having increased health issues.  Pt had head CT overnight to r/o CVA that was negative, but likely this may be baseline fluctuation in mental status that daughter mentioned.  Eliane Decree, RN

## 2016-02-08 NOTE — Progress Notes (Signed)
Patient ID: Frank Davidson, male   DOB: 1951/01/03, 65 y.o.   MRN: [id number]     Advanced Heart Failure Rounding Note  PCP: Dr. Shela Commons. [place] Nephrologist: Dr. Briant Cedar HF: Bensimhon  Subjective:    Echo 02/04/16 LVEF reduced to 10-15% from 25-30% in 08/2015  RHC (3/17): RA mean 16 PA 70/31 mean 47 PCWP mean 25 CI 1.6  Swan numbers this morning: RA mean 10-11 PA 65/37 PCWP mean ?32 (waveform not good but suspect still elevated) CI 1.7 Co-ox 56% => 54%  Milrinone stopped 3/17 and dobutamine 5 mcg/kg/min begun.  He diuresed well again yesterday and is 5 lbs down, but cardiac output is still low. BUN/creatinine are coming down, creatinine getting close to his baseline.   He was confused last night, head CT was done without acute abnormality.  This morning, per nurse he is better.  He can converse with me normally but has some deficits => can tell me the year but not the month.    Objective:   Weight Range: 215 lb 13.3 oz (97.9 kg) Body mass index is 30.97 kg/(m^2).   Vital Signs:   Temp:  [96.1 F (35.6 C)-98 F (36.7 C)] 96.3 F (35.7 C) (03/19 0600) Pulse Rate:  [43-83] 72 (03/19 0600) Resp:  [7-23] 17 (03/19 0600) BP: (94-141)/(60-98) 115/80 mmHg (03/19 0600) SpO2:  [94 %-100 %] 100 % (03/19 0600) Weight:  [215 lb 13.3 oz (97.9 kg)] 215 lb 13.3 oz (97.9 kg) (03/19 0332) Last BM Date: 02/06/16  Weight change: Filed Weights   02/06/16 1100 02/07/16 0440 02/08/16 0332  Weight: 226 lb 6.6 oz (102.7 kg) 220 lb 7.4 oz (100 kg) 215 lb 13.3 oz (97.9 kg)    Intake/Output:   Intake/Output Summary (Last 24 hours) at 02/08/16 0845 Last data filed at 02/08/16 0600  Gross per 24 hour  Intake 1805.35 ml  Output   4035 ml  Net -2229.65 ml     Physical Exam: General: Obese, pleasant HEENT: normal Neck: supple. JVP 9-10 cm. Carotids 2+ bilat; no bruits. No thyromegaly or nodule noted. Cor: PMI nondisplaced. irregularly irregular.  Lungs: Slightly diminished  bases. Abdomen: Obese, soft, NT, mildly distended, no HSM. No bruits or masses. +BS  Extremities: no cyanosis, clubbing, rash. No edema.   Neuro: alert & orientedx3, cranial nerves grossly intact. moves all 4 extremities w/o difficulty. Affect pleasant  Telemetry: Reviewed, atypical atrial flutter with 2:1 block in 70s  Labs: CBC  Recent Labs  02/08/16 0340  WBC 12.5*  HGB 12.3*  HCT 38.5*  MCV 85.4  PLT 139*   Basic Metabolic Panel  Recent Labs  02/07/16 1638 02/08/16 0340  NA 137 137  K 3.2* 3.4*  CL 91* 89*  CO2 35* 33*  GLUCOSE 108* 96  BUN 109* 96*  CREATININE 2.60* 2.26*  CALCIUM 9.5 9.8   Liver Function Tests No results for input(s): AST, ALT, ALKPHOS, BILITOT, PROT, ALBUMIN in the last 72 hours. No results for input(s): LIPASE, AMYLASE in the last 72 hours. Cardiac Enzymes No results for input(s): CKTOTAL, CKMB, CKMBINDEX, TROPONINI in the last 72 hours.  BNP: BNP (last 3 results)  Recent Labs  12/11/15 2345 01/13/16 1544 02/03/16 0115  BNP 1024.0* 843.5* 750.3*    ProBNP (last 3 results) No results for input(s): PROBNP in the last 8760 hours.   D-Dimer No results for input(s): DDIMER in the last 72 hours. Hemoglobin A1C No results for input(s): HGBA1C in the last 72 hours. Fasting Lipid Panel No  results for input(s): CHOL, HDL, LDLCALC, TRIG, CHOLHDL, LDLDIRECT in the last 72 hours. Thyroid Function Tests No results for input(s): TSH, T4TOTAL, T3FREE, THYROIDAB in the last 72 hours.  Invalid input(s): FREET3  Other results:     Imaging/Studies:  Ct Head Wo Contrast  02/07/2016  CLINICAL DATA:  Called by nurse reporting that patient is a little confused. He is on heparin and there is a concern for intracranial bleeding. EXAM: CT HEAD WITHOUT CONTRAST TECHNIQUE: Contiguous axial images were obtained from the base of the skull through the vertex without intravenous contrast. COMPARISON:  CT head 10/19/2014 FINDINGS: There is central and  cortical atrophy. Periventricular white matter changes are consistent with small vessel disease. 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 Remote infarct involving the left frontal and left parietal white matter, stable in appearance. Remote lacunar infarct involving the left lentiform nuclei. There is no intra or extra-axial fluid collection or mass lesion. The basilar cisterns and ventricles have a normal appearance. There is no CT evidence for acute infarction or hemorrhage. Bone windows demonstrate no acute calvarial injury. There is moderate atherosclerosis of the internal carotid arteries. IMPRESSION: 1. Atrophy and small vessel disease. 2. Multiple remote and stable infarcts. 3. No evidence for acute  abnormality. Electronically Signed   By: Norva Pavlov M.D.   On: 02/07/2016 23:08    Latest Echo  Latest Cath   Medications:     Scheduled Medications: . allopurinol  100 mg Oral Daily  . budesonide (PULMICORT) nebulizer solution  0.25 mg Nebulization BID  . carvedilol  6.25 mg Oral BID WC  . docusate sodium  100 mg Oral BID  . glipiZIDE  5 mg Oral BID AC  . hydrALAZINE  37.5 mg Oral TID  . insulin aspart  0-9 Units Subcutaneous TID WC  . ipratropium-albuterol  3 mL Nebulization TID  . isosorbide mononitrate  60 mg Oral Daily  . omega-3 acid ethyl esters  1 g Oral Daily  . potassium chloride SA  40 mEq Oral BID  . sodium chloride flush  10-40 mL Intracatheter Q12H  . sodium chloride flush  3 mL Intravenous Q12H  . sodium chloride flush  3 mL Intravenous Q12H    Infusions: . DOBUTamine 5 mcg/kg/min (02/07/16 2245)  . furosemide (LASIX) infusion 10 mg/hr (02/07/16 1757)  . heparin 1,250 Units/hr (02/07/16 1637)    PRN Medications: sodium chloride, sodium chloride, acetaminophen **OR** acetaminophen, acetaminophen, albuterol, baclofen, ondansetron **OR** ondansetron (ZOFRAN) IV, ondansetron (ZOFRAN) IV, oxyCODONE-acetaminophen **AND** oxyCODONE,  sodium chloride flush, sodium chloride flush, sodium chloride flush   Assessment   1. Acute respiratory failure with hypoxia 2. Acute on chronic systolic CHF with cardiogenic shock, now on dobutamine.  3. 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 Chronic Afib 5. HTN 6. DM2 7. OSA: does not use CPAP.  8. Delirium  Plan    He is now on dobutamine at 5 but cardiac output is still low by co-ox and thermodilution (co-ox stable compared to yesterday).  Good diuresis again yesterday but filling pressures remain elevated (PCWP waveform not ideal but think high).  - Continue Lasix 10 mg/hr today.   - I am going to stop Coreg today to see if this will help cardiac output.  He appears to be in a stable atypical atrial flutter with 2:1 block (will get ECG).  Long-term, being on dobutamine will be difficult because of his atrial arrhythmias and the dobutamine antagonism that we get from beta blocker use.  Will continue current dose of dobutamine for now.  When diuresed, would consider trying him back on milrinone again for home use (suspect he will need home inotrope) as this would make beta blockade for rate control easier.  -Continue hydralazine 37.5 tid and Imdur 60 daily.   Continue heparin gtt for now for atrial fibrillation, can go ahead and start back on warfarin.   BUN/creatinine improving.  HD would be very difficult if he continues to require inotropes.  Hopefully we can avoid this.  Hypokalemic, replete and repeat BMET this afternoon.   Some confusion, head CT negative.  May be related to low cardiac output.  No fever.   35 minutes critical care time.   Length of Stay: 5  Marca Ancona MD  02/08/2016, 8:45 AM  Advanced Heart Failure Team Pager [phone] (M-F; 7a - 4p)  Please contact [facility] for night-coverage after hours (4p -7a ) and weekends on [email or web address]

## 2016-02-09 ENCOUNTER — Inpatient Hospital Stay (HOSPITAL_COMMUNITY): Payer: Commercial Managed Care - HMO

## 2016-02-09 DIAGNOSIS — I484 Atypical atrial flutter: Secondary | ICD-10-CM

## 2016-02-09 LAB — CBC
HEMATOCRIT: 43.5 % (ref 39.0–52.0)
HEMOGLOBIN: 13.7 g/dL (ref 13.0–17.0)
MCH: 27.5 pg (ref 26.0–34.0)
MCHC: 31.5 g/dL (ref 30.0–36.0)
MCV: 87.2 fL (ref 78.0–100.0)
Platelets: 136 10*3/uL — ABNORMAL LOW (ref 150–400)
RBC: 4.99 MIL/uL (ref 4.22–5.81)
RDW: 18 % — ABNORMAL HIGH (ref 11.5–15.5)
WBC: 11.8 10*3/uL — ABNORMAL HIGH (ref 4.0–10.5)

## 2016-02-09 LAB — GLUCOSE, CAPILLARY
GLUCOSE-CAPILLARY: 85 mg/dL (ref 65–99)
GLUCOSE-CAPILLARY: 156 mg/dL — AB (ref 65–99)
Glucose-Capillary: 104 mg/dL — ABNORMAL HIGH (ref 65–99)
Glucose-Capillary: 113 mg/dL — ABNORMAL HIGH (ref 65–99)

## 2016-02-09 LAB — BASIC METABOLIC PANEL
ANION GAP: 13 (ref 5–15)
BUN: 69 mg/dL — AB (ref 6–20)
CHLORIDE: 94 mmol/L — AB (ref 101–111)
CO2: 32 mmol/L (ref 22–32)
Calcium: 10 mg/dL (ref 8.9–10.3)
Creatinine, Ser: 1.95 mg/dL — ABNORMAL HIGH (ref 0.61–1.24)
GFR calc Af Amer: 40 mL/min — ABNORMAL LOW (ref >60)
GFR, EST NON AFRICAN AMERICAN: 34 mL/min — AB (ref >60)
GLUCOSE: 66 mg/dL (ref 65–99)
POTASSIUM: 4.2 mmol/L (ref 3.5–5.1)
SODIUM: 139 mmol/L (ref 135–145)

## 2016-02-09 LAB — APTT: APTT: 99 s — AB (ref 24–37)

## 2016-02-09 LAB — CARBOXYHEMOGLOBIN
Carboxyhemoglobin: 1.5 % (ref 0.5–1.5)
METHEMOGLOBIN: 0.9 % (ref 0.0–1.5)
O2 SAT: 66 %
TOTAL HEMOGLOBIN: 13.7 g/dL (ref 13.5–18.0)

## 2016-02-09 LAB — HEPARIN LEVEL (UNFRACTIONATED): Heparin Unfractionated: 1.5 IU/mL — ABNORMAL HIGH (ref 0.30–0.70)

## 2016-02-09 MED ORDER — WARFARIN SODIUM 7.5 MG PO TABS
7.5000 mg | ORAL_TABLET | Freq: Once | ORAL | Status: AC
  Administered 2016-02-09: 7.5 mg via ORAL
  Filled 2016-02-09: qty 1

## 2016-02-09 MED ORDER — TORSEMIDE 20 MG PO TABS
100.0000 mg | ORAL_TABLET | Freq: Two times a day (BID) | ORAL | Status: DC
  Administered 2016-02-09 – 2016-02-12 (×7): 100 mg via ORAL
  Filled 2016-02-09 (×7): qty 5

## 2016-02-09 MED ORDER — SODIUM CHLORIDE 0.9% FLUSH: 10.0000 mL | INTRAVENOUS | Status: DC | PRN

## 2016-02-09 MED ORDER — DOBUTAMINE IN D5W 4-5 MG/ML-% IV SOLN
3.0000 ug/kg/min | INTRAVENOUS | Status: DC
  Administered 2016-02-09: 5 ug/kg/min via INTRAVENOUS
  Administered 2016-02-11: 4 ug/kg/min via INTRAVENOUS
  Filled 2016-02-09: qty 250

## 2016-02-09 MED ORDER — SODIUM CHLORIDE 0.9% FLUSH
10.0000 mL | Freq: Two times a day (BID) | INTRAVENOUS | Status: DC
  Administered 2016-02-11: 10 mL
  Administered 2016-02-12: 20 mL

## 2016-02-09 MED ORDER — WARFARIN - PHARMACIST DOSING INPATIENT
Freq: Every day | Status: DC
  Administered 2016-02-09: 18:00:00

## 2016-02-09 NOTE — Progress Notes (Signed)
Patient ID: Frank Davidson, male   DOB: 09/06/1951, 65 y.o.   MRN: [id number]     Advanced Heart Failure Rounding Note  PCP: Dr. Shela Commons. [place] Nephrologist: Dr. Briant Cedar HF: Bensimhon  Subjective:    Echo 02/04/16 LVEF reduced to 10-15% from 25-30% in 08/2015  RHC (3/17): RA mean 16 PA 70/31 mean 47 PCWP mean 25 CI 1.6  Swan numbers this morning: RA mean 6 PA 62/32  PCWP mean 23 CI 1.7 Co-ox 66%  Milrinone stopped 3/17 and dobutamine 5 mcg/kg/min begun.  He diuresed well again yesterday and is 5 lbs down, but cardiac output is still low. BUN/creatinine are coming down, creatinine getting close to his baseline.   Over the weekend he was confused and had head CT -- No  acute abnormality. Yesterday coreg was stopped.   Today he is A and O x 3. Denies SOB/Orthopnea.       Objective:   Weight Range: 206 lb 2.1 oz (93.5 kg) Body mass index is 29.58 kg/(m^2).   Vital Signs:   Temp:  [96.8 F (36 C)-97.7 F (36.5 C)] 97.5 F (36.4 C) (03/20 0600) Pulse Rate:  [73-78] 74 (03/20 0600) Resp:  [9-31] 12 (03/20 0600) BP: (75-144)/(53-95) 124/84 mmHg (03/20 0600) SpO2:  [96 %-100 %] 100 % (03/20 0600) Weight:  [206 lb 2.1 oz (93.5 kg)] 206 lb 2.1 oz (93.5 kg) (03/20 0400) Last BM Date: 02/06/16  Weight change: Filed Weights   02/07/16 0440 02/08/16 0332 02/09/16 0400  Weight: 220 lb 7.4 oz (100 kg) 215 lb 13.3 oz (97.9 kg) 206 lb 2.1 oz (93.5 kg)    Intake/Output:   Intake/Output Summary (Last 24 hours) at 02/09/16 0706 Last data filed at 02/09/16 0600  Gross per 24 hour  Intake 1752.6 ml  Output   4430 ml  Net -2677.4 ml     Physical Exam: CVP 6-7  General: Obese, pleasant HEENT: normal RIJ swan  Neck: supple. JVP 6-7  cm. Carotids 2+ bilat; no bruits. No thyromegaly or nodule noted. Cor: PMI nondisplaced. irregularly irregular.  Lungs: Slightly diminished bases. Abdomen: Obese, soft, NT, mildly distended, no HSM. No bruits or masses. +BS  Extremities: no  cyanosis, clubbing, rash. No edema.   Neuro: alert & orientedx3, cranial nerves grossly intact. moves all 4 extremities w/o difficulty. Affect pleasant  Telemetry: Atrial flutter with 2:1 block in 70-80s  Labs: CBC  Recent Labs  02/08/16 0340 02/09/16 0345  WBC 12.5* 11.8*  HGB 12.3* 13.7  HCT 38.5* 43.5  MCV 85.4 87.2  PLT 139* 136*   Basic Metabolic Panel  Recent Labs  02/08/16 1728 02/09/16 0345  NA 140 139  K 4.0 4.2  CL 93* 94*  CO2 34* 32  GLUCOSE 103* 66  BUN 82* 69*  CREATININE 2.09* 1.95*  CALCIUM 9.9 10.0   Liver Function Tests No results for input(s): AST, ALT, ALKPHOS, BILITOT, PROT, ALBUMIN in the last 72 hours. No results for input(s): LIPASE, AMYLASE in the last 72 hours. Cardiac Enzymes No results for input(s): CKTOTAL, CKMB, CKMBINDEX, TROPONINI in the last 72 hours.  BNP: BNP (last 3 results)  Recent Labs  12/11/15 2345 01/13/16 1544 02/03/16 0115  BNP 1024.0* 843.5* 750.3*    ProBNP (last 3 results) No results for input(s): PROBNP in the last 8760 hours.   D-Dimer No results for input(s): DDIMER in the last 72 hours. Hemoglobin A1C No results for input(s): HGBA1C in the last 72 hours. Fasting Lipid Panel No results for input(s):  CHOL, HDL, LDLCALC, TRIG, CHOLHDL, LDLDIRECT in the last 72 hours. Thyroid Function Tests No results for input(s): TSH, T4TOTAL, T3FREE, THYROIDAB in the last 72 hours.  Invalid input(s): FREET3  Other results:     Imaging/Studies:  Ct Head Wo Contrast  02/07/2016  CLINICAL DATA:  Called by nurse reporting that patient is a little confused. He is on heparin and there is a concern for intracranial bleeding. EXAM: CT HEAD WITHOUT CONTRAST TECHNIQUE: Contiguous axial images were obtained from the base of the skull through the vertex without intravenous contrast. COMPARISON:  CT head 10/19/2014 FINDINGS: There is central and cortical atrophy. Periventricular white matter changes are consistent with small  vessel disease. 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 Remote infarct involving the left frontal and left parietal white matter, stable in appearance. Remote lacunar infarct involving the left lentiform nuclei. There is no intra or extra-axial fluid collection or mass lesion. The basilar cisterns and ventricles have a normal appearance. There is no CT evidence for acute infarction or hemorrhage. Bone windows demonstrate no acute calvarial injury. There is moderate atherosclerosis of the internal carotid arteries. IMPRESSION: 1. Atrophy and small vessel disease. 2. Multiple remote and stable infarcts. 3. No evidence for acute  abnormality. Electronically Signed   By: Norva Pavlov M.D.   On: 02/07/2016 23:08    Latest Echo  Latest Cath   Medications:     Scheduled Medications: . allopurinol  100 mg Oral Daily  . budesonide (PULMICORT) nebulizer solution  0.25 mg Nebulization BID  . docusate sodium  100 mg Oral BID  . glipiZIDE  5 mg Oral BID AC  . hydrALAZINE  37.5 mg Oral TID  . insulin aspart  0-9 Units Subcutaneous TID WC  . ipratropium-albuterol  3 mL Nebulization TID  . isosorbide mononitrate  60 mg Oral Daily  . omega-3 acid ethyl esters  1 g Oral Daily  . potassium chloride SA  40 mEq Oral BID  . sodium chloride flush  10-40 mL Intracatheter Q12H  . sodium chloride flush  3 mL Intravenous Q12H  . sodium chloride flush  3 mL Intravenous Q12H    Infusions: . DOBUTamine 5 mcg/kg/min (02/08/16 2000)  . furosemide (LASIX) infusion 10 mg/hr (02/08/16 2151)  . heparin 1,150 Units/hr (02/08/16 2000)    PRN Medications: sodium chloride, sodium chloride, acetaminophen **OR** acetaminophen, acetaminophen, albuterol, baclofen, ondansetron **OR** ondansetron (ZOFRAN) IV, ondansetron (ZOFRAN) IV, oxyCODONE-acetaminophen **AND** oxyCODONE, sodium chloride flush, sodium chloride flush, sodium chloride flush   Assessment   1. Acute respiratory failure  with hypoxia 2. Acute on chronic systolic CHF with cardiogenic shock, now on dobutamine.  3. 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 Chronic Afib 5. HTN 6. DM2 7. OSA: does not use CPAP.  8. Delirium  Plan     He is now on dobutamine at 5 but cardiac output is still low by co-ox and thermodilution (co-ox stable compared to yesterday).  CVP 6-7. Stop IV lasix and start torsemide 100 mg twice a day. Continue hydralazine 37.5 tid and Imdur 60 daily. Keep off Coreg for now.   Place PICC single lumen for home dobutamine.    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  Long-term, being on dobutamine may be difficult because of his atrial arrhythmias and the dobutamine antagonism that we get from beta blocker use.  However, he did better with dobutamine than milrinone.   Continue heparin gtt for now for atrial  fibrillation. Start coumadin. INR 1.5   BUN/creatinine improving.  HD would be very difficult if he continues to require inotropes.  Hopefully we can avoid this.  Confusion has resolved.   Consult palliative care for goals of care. He has refused ICD in the past.    Length of Stay: 6  Amy Clegg NP-C  02/09/2016, 7:06 AM  Advanced Heart Failure Team Pager [phone] (M-F; 7a - 4p)  Please contact [facility] for night-coverage after hours (4p -7a ) and weekends on [email or web address]  Patient seen with NP, agree with the above note.  He is stable this morning, filling pressures coming down. Creatinine continues to come down. Cardiac output still low by thermodilution, better by co-ox.  He remains on dobutamine at 5, HR tolerating off beta blocker (atypical atrial flutter in 70s).  Will stop Lasix gtt and start po torsemide today.  Continue current hydralazine/Imdur, do not have BP room to adjust up any.  Long-term, I think he is going to need to be on a home inotrope, unfortunately.  Will remove Swan today and have him get up to walk with cardiac rehab. If HR goes up a  lot, may need to try to get him on milrinone + beta blocker to keep HR down.   Start warfarin today (will use rather than apixaban with renal issues).   Marca Ancona 02/09/2016 7:50 AM

## 2016-02-09 NOTE — Care Management Note (Signed)
Case Management Note  Patient Details  Name: Frank Davidson MRN: [id number] Date of Birth: November 03, 1951  Subjective/Objective:        Adm w resp failure            Action/Plan: lives w fam, act w [facility] and thn for dis mangemnt   Expected Discharge Date:                  Expected Discharge Plan:  Home w Home Health Services  In-House Referral:     Discharge planning Services  CM Consult  Post Acute Care Choice:  Resumption of Svcs/PTA Provider, Durable Medical Equipment Choice offered to:     DME Arranged:  IV pump/equipment DME Agency:  [facility]  HH Arranged:  RN, Disease Management HH Agency:  [facility], [facility]  Status of Service:  Completed, signed off  Medicare Important Message Given:    Date Medicare IM Given:    Medicare IM give by:    Date Additional Medicare IM Given:    Additional Medicare Important Message give by:     If discussed at Long Length of Stay Meetings, dates discussed:    Additional Comments:have alerted tiffany w [facility] that pt will prob need iv dobutamine at disch. Will cont to follow.  Hanley Hays, RN 02/09/2016, 10:13 AM

## 2016-02-09 NOTE — Progress Notes (Signed)
Patient ID: Frank Davidson, male   DOB: 1951-06-21, 65 y.o.   MRN: [id number]  [facility] Progress Note    Assessment/ Plan:   1. AKI on CKD stage III: Consistent with acute on chronic cardiorenal syndrome/acute CHF exacerbation-excellent response to diuretics in conjunction with dobutamine and milrinone (more impressive with the former). Being prepared for chronic intravenous dobutamine outpatient therapy. Renal function continues to remain close to baseline at this time. 2. Acute systolic CHF exacerbation: Excellent response to diuretic therapy/inotropic support and being prepared for outpatient chronic dobutamine therapy. Agree with palliative care auscultation as chronic renal replacement therapy is unlikely to offer him much benefit while on chronic inotropic support. 3. Altered mental status: Noted over the weekend and suspected to be metabolic encephalopathy of unclear etiology- the patient insists that it was from pain/discomfort associated with his chronic fibromyalgia. He does not have focal symptoms suggestive of infection. 4. Paroxysmal atrial fibrillation: Anticoagulation therapy per cardiology and monitoring closely while on dobutamine  Subjective:   Reports to be feeling well with some discomfort over right neck-Swan catheter recently removed    Objective:   BP 109/71 mmHg  Pulse 76  Temp(Src) 97.3 F (36.3 C) (Core (Comment))  Resp 16  Ht  (1.778 m)  Wt 93.5 kg (206 lb 2.1 oz)  BMI 29.58 kg/m2  SpO2 100%  Intake/Output Summary (Last 24 hours) at 02/09/16 0940 Last data filed at 02/09/16 0800  Gross per 24 hour  Intake 1511.6 ml  Output   4580 ml  Net -3068.4 ml   Weight change: -4.4 kg (-9 lb 11.2 oz)  Physical Exam: WUJ:[id number] resting in bed, watching television CVS: Pulse irregularly irregular, S1 and S2 normal Resp: Poor inspiratory effort-increased breath sounds over bases otherwise clear Abd: Soft, obese, nontender Ext: No lower  extremity edema appreciated  Imaging: Ct Head Wo Contrast  02/07/2016  CLINICAL DATA:  Called by nurse reporting that patient is a little confused. He is on heparin and there is a concern for intracranial bleeding. EXAM: CT HEAD WITHOUT CONTRAST TECHNIQUE: Contiguous axial images were obtained from the base of the skull through the vertex without intravenous contrast. COMPARISON:  CT head 10/19/2014 FINDINGS: There is central and cortical atrophy. Periventricular white matter changes are consistent with small vessel disease. 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 Remote infarct involving the left frontal and left parietal white matter, stable in appearance. Remote lacunar infarct involving the left lentiform nuclei. There is no intra or extra-axial fluid collection or mass lesion. The basilar cisterns and ventricles have a normal appearance. There is no CT evidence for acute infarction or hemorrhage. Bone windows demonstrate no acute calvarial injury. There is moderate atherosclerosis of the internal carotid arteries. IMPRESSION: 1. Atrophy and small vessel disease. 2. Multiple remote and stable infarcts. 3. No evidence for acute  abnormality. Electronically Signed   By: Norva Pavlov M.D.   On: 02/07/2016 23:08    Labs: BMET  Recent Labs Lab 02/05/16 0450 02/06/16 0526 02/07/16 0437 02/07/16 1638 02/08/16 0340 02/08/16 1728 02/09/16 0345  NA 130* 134* 139 137 137 140 139  K 4.2 4.1 3.3* 3.2* 3.4* 4.0 4.2  CL 90* 91* 92* 91* 89* 93* 94*  CO2 24 26 33* 35* 33* 34* 32  GLUCOSE 121* 89 109* 108* 96 103* 66  BUN 142* 149* 119* 109* 96* 82* 69*  CREATININE 3.53* 3.51* 2.56* 2.60* 2.26* 2.09* 1.95*  CALCIUM 8.9 9.5 9.4 9.5 9.8 9.9 10.0  CBC  Recent Labs Lab 02/03/16 0115 02/03/16 0914 02/04/16 0400 02/08/16 0340 02/09/16 0345  WBC 10.2 8.1 12.8* 12.5* 11.8*  NEUTROABS 7.2 7.2  --   --   --   HGB 11.8* 12.4* 11.2* 12.3* 13.7  HCT 38.0* 39.8 35.7*  38.5* 43.5  MCV 86.8 87.9 85.8 85.4 87.2  PLT 207 186 169 139* 136*    Medications:    . allopurinol  100 mg Oral Daily  . budesonide (PULMICORT) nebulizer solution  0.25 mg Nebulization BID  . docusate sodium  100 mg Oral BID  . glipiZIDE  5 mg Oral BID AC  . hydrALAZINE  37.5 mg Oral TID  . insulin aspart  0-9 Units Subcutaneous TID WC  . ipratropium-albuterol  3 mL Nebulization TID  . isosorbide mononitrate  60 mg Oral Daily  . omega-3 acid ethyl esters  1 g Oral Daily  . potassium chloride SA  40 mEq Oral BID  . sodium chloride flush  10-40 mL Intracatheter Q12H  . sodium chloride flush  3 mL Intravenous Q12H  . sodium chloride flush  3 mL Intravenous Q12H  . torsemide  100 mg Oral BID  . warfarin  7.5 mg Oral ONCE-1800  . Warfarin - Pharmacist Dosing Inpatient   Does not apply [id number]   Zetta Bills, MD 02/09/2016, 9:40 AM

## 2016-02-09 NOTE — Progress Notes (Signed)
ANTICOAGULATION CONSULT NOTE - Follow Up Consult  Pharmacy Consult for heparin/warfarin Indication: atrial fibrillation  Allergies  Allergen Reactions  . Corlanor [Ivabradine] Palpitations and Other (See Comments)    Kidney and heart problems, syncope  . Entresto [Sacubitril-Valsartan]     Syncope  . Januvia [Sitagliptin] Other (See Comments)    "Almost killed me"   . Lunesta [Eszopiclone] Palpitations and Other (See Comments)    Kidney and heart problems, syncope  . Actos [Pioglitazone] Other (See Comments)    Caused blood in urine and fluid retention     Patient Measurements: Height: 5\' 10"  (177.8 cm) Weight: 206 lb 2.1 oz (93.5 kg) IBW/kg (Calculated) : 73 Heparin Dosing Weight: 94 kg  Vital Signs: Temp: 97.3 F (36.3 C) (03/20 0700) Temp Source: Core (Comment) (03/20 0400) BP: 122/79 mmHg (03/20 0700) Pulse Rate: 77 (03/20 0700)  Labs:  Recent Labs  02/06/16 1558  02/08/16 0340 02/08/16 1529 02/08/16 1728 02/09/16 0345  HGB  --   --  12.3*  --   --  13.7  HCT  --   --  38.5*  --   --  43.5  PLT  --   --  139*  --   --  136*  APTT 45*  < > 115* 87*  --  99*  HEPARINUNFRC >2.20*  --  >2.20*  --   --  1.50*  CREATININE  --   < > 2.26*  --  2.09* 1.95*  < > = values in this interval not displayed.  Estimated Creatinine Clearance: 43.4 mL/min (by C-G formula based on Cr of 1.95).  Assessment: 65 yo M admitted 02/02/2016 with SOB and edema. Echo on 3/15 shows reduced LVEF of 10-15% from 25-30% in 08/2015. Patient now has cardiorenal syndrome and possible need for CRRT. Patient is on Eliquis PTA for Afib with last dose on 3/17 at 1124. Hep gtt started 12 hrs after last dose of apixaban. Will dose off aPTTs until correlating with HL. Now to bridge to warfarin instead of resuming apixaban due to decline in renal function.   aPTT 99 (therapeutic), HL >1.5 (due to Eliquis), Hgb 13.7, Plt 136. No s/sx of bleeding noted.  Goal of Therapy:  Heparin level 0.3-0.7 units/ml   aPTT 66-102s Monitor platelets by anticoagulation protocol: Yes   Plan:  - Continue heparin at 1150 units/hr - Warfarin 7.5mg *1 tonight - Monitor daily aPTT, HL, CBC, INR and s/sx of bleeding   Sherron Monday, PharmD Clinical Pharmacy Resident Pager: [phone] 02/09/2016 7:42 AM

## 2016-02-09 NOTE — Progress Notes (Signed)
[facility]  Patient Status: Active pt with [facility] prior to this readmission  [facility] is providing the following services: HHRN prior to admission and now will add Home Inotrope Team for home Dobutamine if needed at DC.  [facility] hospital infusion coordinator will provide in hospital teaching with pt/family to support transition home.  [facility] team will follow up to DC to home when ordered.  If patient discharges after hours, please call [phone].   Frank Davidson 02/09/2016, 10:53 AM

## 2016-02-09 NOTE — Progress Notes (Signed)
Peripherally Inserted Central Catheter/Midline Placement  The IV Nurse has discussed with the patient and/or persons authorized to consent for the patient, the purpose of this procedure and the potential benefits and risks involved with this procedure.  The benefits include less needle sticks, lab draws from the catheter and patient may be discharged home with the catheter.  Risks include, but not limited to, infection, bleeding, blood clot (thrombus formation), and puncture of an artery; nerve damage and irregular heat beat.  Alternatives to this procedure were also discussed.  PICC/Midline Placement Documentation        Frank Davidson, Frank Davidson 02/09/2016, 7:38 PM

## 2016-02-10 LAB — PROTIME-INR
INR: 1.2 (ref 0.00–1.49)
Prothrombin Time: 15.3 seconds — ABNORMAL HIGH (ref 11.6–15.2)

## 2016-02-10 LAB — HEPARIN LEVEL (UNFRACTIONATED): HEPARIN UNFRACTIONATED: 0.88 [IU]/mL — AB (ref 0.30–0.70)

## 2016-02-10 LAB — BASIC METABOLIC PANEL
ANION GAP: 12 (ref 5–15)
BUN: 62 mg/dL — ABNORMAL HIGH (ref 6–20)
CO2: 29 mmol/L (ref 22–32)
Calcium: 9.9 mg/dL (ref 8.9–10.3)
Chloride: 94 mmol/L — ABNORMAL LOW (ref 101–111)
Creatinine, Ser: 2.2 mg/dL — ABNORMAL HIGH (ref 0.61–1.24)
GFR calc Af Amer: 34 mL/min — ABNORMAL LOW (ref >60)
GFR, EST NON AFRICAN AMERICAN: 30 mL/min — AB (ref >60)
GLUCOSE: 112 mg/dL — AB (ref 65–99)
POTASSIUM: 4.3 mmol/L (ref 3.5–5.1)
Sodium: 135 mmol/L (ref 135–145)

## 2016-02-10 LAB — CARBOXYHEMOGLOBIN
CARBOXYHEMOGLOBIN: 1.6 % — AB (ref 0.5–1.5)
Methemoglobin: 0.8 % (ref 0.0–1.5)
O2 SAT: 62 %
Total hemoglobin: 14 g/dL (ref 13.5–18.0)

## 2016-02-10 LAB — GLUCOSE, CAPILLARY
GLUCOSE-CAPILLARY: 89 mg/dL (ref 65–99)
Glucose-Capillary: 157 mg/dL — ABNORMAL HIGH (ref 65–99)
Glucose-Capillary: 100 mg/dL — ABNORMAL HIGH (ref 65–99)

## 2016-02-10 LAB — APTT: aPTT: 78 seconds — ABNORMAL HIGH (ref 24–37)

## 2016-02-10 MED ORDER — HYDRALAZINE HCL 50 MG PO TABS
50.0000 mg | ORAL_TABLET | Freq: Three times a day (TID) | ORAL | Status: DC
  Administered 2016-02-10 (×3): 50 mg via ORAL
  Filled 2016-02-10 (×3): qty 1

## 2016-02-10 MED ORDER — WARFARIN SODIUM 10 MG PO TABS
10.0000 mg | ORAL_TABLET | Freq: Once | ORAL | Status: AC
  Administered 2016-02-10: 10 mg via ORAL
  Filled 2016-02-10: qty 1

## 2016-02-10 NOTE — Progress Notes (Signed)
Patient ID: Frank Davidson, male   DOB: 05-Jul-1951, 65 y.o.   MRN: [id number]  [facility] Progress Note    Assessment/ Plan:   1. AKI on CKD stage III: Consistent with acute on chronic cardiorenal syndrome/acute CHF exacerbation-renal function remains at baseline with good response to diuretic/dobutamine. No acute dialysis needs at this time-would recommend for midline PICC placement by IR for chronic dobutamine infusion in order to save arm/peripheral veins for possible dialysis access in the future (his candidacy albeit is marginal with systolic heart failure). 2. Acute systolic CHF exacerbation: Excellent response to diuretic therapy/inotropic support and being prepared for outpatient chronic dobutamine therapy. Inotropic titration per cardiology. 3. Altered mental status: Transient changes in mental status over the weekend without any acute CVA/intracranial process seen on imaging. Suspected to be from hypoperfusion versus pain. 4. Paroxysmal atrial fibrillation: Anticoagulation therapy per cardiology and monitoring closely while on dobutamine  We will sign off at this time-please reconsult as needed. He has a follow-up appointment with Dr. Briant Cedar on 03/09/16.  Subjective:   Reports to be feeling well and denies any chest pain or shortness of breath. He is aware of the decision regarding possible long-term dobutamine infusion.    Objective:   BP 139/86 mmHg  Pulse 78  Temp(Src) 98.2 F (36.8 C) (Oral)  Resp 12  Ht  (1.778 m)  Wt 95.074 kg (209 lb 9.6 oz)  BMI 30.07 kg/m2  SpO2 98%  Intake/Output Summary (Last 24 hours) at 02/10/16 4098 Last data filed at 02/10/16 0800  Gross per 24 hour  Intake 812.33 ml  Output   2238 ml  Net -1425.67 ml   Weight change: 1.574 kg (3 lb 7.5 oz)  Physical Exam: [id number] resting in recliner, watching television CVS: Pulse irregularly irregular, S1 and S2 normal Resp: Poor inspiratory effort-increased breath  sounds over bases otherwise clear Abd: Soft, obese, nontender Ext: No lower extremity edema appreciated  Imaging: Dg Chest Port 1 View  02/09/2016  CLINICAL DATA:  Line placement. EXAM: PORTABLE CHEST 1 VIEW COMPARISON:  02/02/2016 FINDINGS: An left PICC catheter has been placed with tip over the lower SVC region. No pneumothorax. Pain right jugular central venous catheter is in place with tip over the upper SVC. No pneumothorax. Shallow inspiration. Heart size and pulmonary vascularity are normal for technique. Mild linear atelectasis in the mid lungs. No focal consolidation. No blunting of costophrenic angles. Mediastinal contours appear intact. IMPRESSION: Left PICC catheter with tip over the lower SVC region. Right central venous catheter with tip over the upper SVC region. No pneumothorax. Electronically Signed   By: Burman Nieves M.D.   On: 02/09/2016 20:05    Labs: BMET  Recent Labs Lab 02/06/16 0526 02/07/16 0437 02/07/16 1638 02/08/16 0340 02/08/16 1728 02/09/16 0345 02/10/16 0326  NA 134* 139 137 137 140 139 135  K 4.1 3.3* 3.2* 3.4* 4.0 4.2 4.3  CL 91* 92* 91* 89* 93* 94* 94*  CO2 26 33* 35* 33* 34* 32 29  GLUCOSE 89 109* 108* 96 103* 66 112*  BUN 149* 119* 109* 96* 82* 69* 62*  CREATININE 3.51* 2.56* 2.60* 2.26* 2.09* 1.95* 2.20*  CALCIUM 9.5 9.4 9.5 9.8 9.9 10.0 9.9   CBC  Recent Labs Lab 02/03/16 0914 02/04/16 0400 02/08/16 0340 02/09/16 0345  WBC 8.1 12.8* 12.5* 11.8*  NEUTROABS 7.2  --   --   --   HGB 12.4* 11.2* 12.3* 13.7  HCT 39.8 35.7* 38.5* 43.5  MCV 87.9  85.8 85.4 87.2  PLT 186 169 139* 136*    Medications:    . allopurinol  100 mg Oral Daily  . budesonide (PULMICORT) nebulizer solution  0.25 mg Nebulization BID  . docusate sodium  100 mg Oral BID  . hydrALAZINE  50 mg Oral TID  . insulin aspart  0-9 Units Subcutaneous TID WC  . isosorbide mononitrate  60 mg Oral Daily  . omega-3 acid ethyl esters  1 g Oral Daily  . potassium chloride SA   40 mEq Oral BID  . sodium chloride flush  10-40 mL Intracatheter Q12H  . sodium chloride flush  10-40 mL Intracatheter Q12H  . sodium chloride flush  3 mL Intravenous Q12H  . sodium chloride flush  3 mL Intravenous Q12H  . torsemide  100 mg Oral BID  . warfarin  10 mg Oral ONCE-1800  . Warfarin - Pharmacist Dosing Inpatient   Does not apply [id number]   Zetta Bills, MD 02/10/2016, 9:06 AM

## 2016-02-10 NOTE — Evaluation (Signed)
Physical Therapy Evaluation Patient Details Name: DEONDRAE MCGRAIL MRN: [id number] DOB: 07/07/1951 Today's Date: 02/10/2016   History of Present Illness  RAMESH MOAN is a 65 y.o. male with history of nonischemic cardiomyopathy last EF measured as 25%, COPD, OSA, obesity, diabetes mellitus, paroxysmal atrial fibrillation presents to the ER because of increasing shortness of breath, found to have decreasede EF to 15%.  Patient also with CKD stage IV due to cardiorenal syndrome.   Clinical Impression  Patient presents with decreased mobility due to deficits listed in PT problem list.  He will benefit from skilled PT in the acute setting to allow return home with family support and to resume HHPT.      Follow Up Recommendations Home health PT;Supervision for mobility/OOB    Equipment Recommendations  None recommended by PT    Recommendations for Other Services OT consult     Precautions / Restrictions Precautions Precautions: Fall Precaution Comments: watch HR      Mobility  Bed Mobility Overal bed mobility: Needs Assistance Bed Mobility: Supine to Sit;Sit to Supine     Supine to sit: Min assist Sit to supine: Supervision   General bed mobility comments: with increased effort to rise used rails and provided support behind trunk   Transfers Overall transfer level: Needs assistance Equipment used: Rolling walker (2 wheeled) Transfers: Sit to/from Stand Sit to Stand: Min assist         General transfer comment: to rise from bed and toilet in bathroom assist to stand  Ambulation/Gait Ambulation/Gait assistance: Min guard;Supervision Ambulation Distance (Feet): 150 Feet Assistive device: Rolling walker (2 wheeled) Gait Pattern/deviations: Step-through pattern;Decreased stride length;Trunk flexed     General Gait Details: assist for maneuvering walker in room to bathroom around furniture; pt somewhat impulsive and HR max 174 (pt reports HR normally goes up like  this)  Careers information officer    Modified Rankin (Stroke Patients Only)       Balance Overall balance assessment: Needs assistance         Standing balance support: Bilateral upper extremity supported;Single extremity supported;During functional activity Standing balance-Leahy Scale: Poor Standing balance comment: UE support neede for balance; able to wipe after toleting with steady assist                              Pertinent Vitals/Pain Pain Assessment:  (none currently, but reports has leg cramps at times and takes Baclofen)    Home Living Family/patient expects to be discharged to:: Private residence Living Arrangements: Other relatives (neice and sister) Available Help at Discharge: Family;Available PRN/intermittently Type of Home: House Home Access: Stairs to enter Entrance Stairs-Rails: Doctor, general practice of Steps: 3 Home Layout: One level Home Equipment: Walker - 2 wheels;Walker - standard      Prior Function Level of Independence: Independent               Hand Dominance        Extremity/Trunk Assessment   Upper Extremity Assessment: Overall WFL for tasks assessed           Lower Extremity Assessment: Generalized weakness         Communication   Communication: No difficulties  Cognition Arousal/Alertness: Awake/alert Behavior During Therapy: WFL for tasks assessed/performed Overall Cognitive Status: No family/caregiver present to determine baseline cognitive functioning Area of Impairment: Orientation;Memory Orientation Level: Time;Situation   Memory: Decreased  short-term memory         General Comments: stated he was admitted because Dr. B wanted to put something in his neck    General Comments General comments (skin integrity, edema, etc.): Ambulated on RA and SpO2 87% at lowest, but pt with hands on walker and sensor on his finger, returns >90 when not holding walker     Exercises        Assessment/Plan    PT Assessment Patient needs continued PT services  PT Diagnosis Generalized weakness;Abnormality of gait   PT Problem List Decreased strength;Decreased mobility;Decreased activity tolerance;Decreased safety awareness;Cardiopulmonary status limiting activity;Decreased balance  PT Treatment Interventions DME instruction;Gait training;Balance training;Stair training;Functional mobility training;Patient/family education;Therapeutic activities;Therapeutic exercise   PT Goals (Current goals can be found in the Care Plan section) Acute Rehab PT Goals Patient Stated Goal: To get a shave, go home PT Goal Formulation: With patient Time For Goal Achievement: 02/17/16 Potential to Achieve Goals: Good    Frequency Min 3X/week   Barriers to discharge        Co-evaluation               End of Session Equipment Utilized During Treatment: Gait belt Activity Tolerance: Patient limited by fatigue Patient left: with call bell/phone within reach;in bed           Time: 1135-1202 PT Time Calculation (min) (ACUTE ONLY): 27 min   Charges:   PT Evaluation $PT Eval Moderate Complexity: 1 Procedure PT Treatments $Gait Training: 8-22 mins   PT G CodesElray Mcgregor 02-16-16, 12:24 PM  Sheran Lawless, PT [phone] February 16, 2016

## 2016-02-10 NOTE — Progress Notes (Signed)
ANTICOAGULATION CONSULT NOTE - Follow Up Consult  Pharmacy Consult for heparin/warfarin Indication: atrial fibrillation  Allergies  Allergen Reactions  . Corlanor [Ivabradine] Palpitations and Other (See Comments)    Kidney and heart problems, syncope  . Entresto [Sacubitril-Valsartan]     Syncope  . Januvia [Sitagliptin] Other (See Comments)    "Almost killed me"   . Lunesta [Eszopiclone] Palpitations and Other (See Comments)    Kidney and heart problems, syncope  . Actos [Pioglitazone] Other (See Comments)    Caused blood in urine and fluid retention     Patient Measurements: Height: 5\' 10"  (177.8 cm) Weight: 209 lb 9.6 oz (95.074 kg) IBW/kg (Calculated) : 73 Heparin Dosing Weight: 94 kg  Vital Signs: Temp: 98 F (36.7 C) (03/21 0400) Temp Source: Oral (03/21 0400) BP: 129/86 mmHg (03/21 0600) Pulse Rate: 82 (03/21 0600)  Labs:  Recent Labs  02/08/16 0340 02/08/16 1529 02/08/16 1728 02/09/16 0345 02/10/16 0326  HGB 12.3*  --   --  13.7  --   HCT 38.5*  --   --  43.5  --   PLT 139*  --   --  136*  --   APTT 115* 87*  --  99* 78*  LABPROT  --   --   --   --  15.3*  INR  --   --   --   --  1.20  HEPARINUNFRC >2.20*  --   --  1.50* 0.88*  CREATININE 2.26*  --  2.09* 1.95* 2.20*    Estimated Creatinine Clearance: 38.7 mL/min (by C-G formula based on Cr of 2.2).  Assessment: 65 yo M admitted 02/02/2016 with SOB and edema. Echo on 3/15 shows reduced LVEF of 10-15% from 25-30% in 08/2015. Patient now has cardiorenal syndrome and possible need for CRRT. Patient is on Eliquis PTA for Afib with last dose on 3/17 at 1124. Hep gtt started 12 hrs after last dose of apixaban. Will dose off aPTTs until correlating with HL. Now to bridge to warfarin instead of resuming apixaban due to decline in renal function.   aPTT 78 (therapeutic), HL 0.88 (due to Eliquis), Hgb 13.7, Plt 136. No s/sx of bleeding noted.  Goal of Therapy:  Heparin level 0.3-0.7 units/ml  aPTT  66-102s Monitor platelets by anticoagulation protocol: Yes   Plan:  - Continue heparin at 1150 units/hr - Warfarin 10mg *1 tonight - Monitor daily aPTT, HL, CBC, INR and s/sx of bleeding   Sherron Monday, PharmD Clinical Pharmacy Resident Pager: [phone] 02/10/2016 7:33 AM

## 2016-02-10 NOTE — Care Management Important Message (Signed)
Important Message  Patient Details  Name: Frank Davidson MRN: [id number] Date of Birth: 1951-03-24   Medicare Important Message Given:  Yes    Oralia Rud Merlin Golden 02/10/2016, 11:56 AM

## 2016-02-10 NOTE — Consult Note (Signed)
Goals of care discussion at bedside with patient.  Frank Davidson is alert and oriented today, in bed with no apparent distress.  He denies pain or shortness of breath.  Reports that he walked with PT earlier in the hallway.  We had a brief discussion regarding his social situation and work history.  He currently lives with his sister and niece.  They moved him from [place] in 2014 when he began to have a deterioration in his health.  Prior to this admission he was driving, doing laundry and attending appointments.    When asked about this hospitalization he stated that at times he has trouble keeping his fluid managed and it becomes hard for him to breath.  He reports being compliant with his medication and diet although notes reveal otherwise.    Goals of care:  Frank Davidson reports that he has completed Living Will, HCPOA documents.  They were completed when he lived in [place].  Unfortunately he says that the copies were lost when he moved to [place].  His niece Bobby Rumpf is his HCPOA.  We reviewed that she would be designated to make decisions for him but only at such a point that he is unable to communicate or make decisions himself.  He verbalizes understanding.  Then we discussed the Living Will information related to life sustaining interventions such as CPR, mechanical intubation, artificial nutrition and hydration.  Frank Davidson reports that he does wish to have life sustaining treatments and would want CPR, intubation, nutrition and hydration. Although, if he his condition worsens and becomes irreversible he would not want his life to be sustained with heroic measures.  His goals are clear to maximize medical treatment for his condition.  He appeared to understand the language that is used in advanced directive documentation so I believe that he has discussed this at some point in the past with a provider.  I emphasized that it is important to make sure that his providers have this  information and that his HCPOA understand his wishes before any decisions as stated above need to be made.  (Frank Davidson does mention 2 daughters, they live out of state but he is in contact and relationship with them both)  I will place a consult to have documentation completed and added to chart information for future encounters.  Frank Davidson seems to have improving insight into the severity of his condition.  He has [facility] for home health needs currently.  I believe that he would also benefit from the [facility] that is available to him through [facility].  I have discussed referral at discharge with Eunice Blase, [facility].  This will provide additional oversight and hopefully assist with identifying decline in condition prior to need for ED and admission.  Plan:  Complete AD documentation.  Continue discussion regarding baseline expectations at home with dobutamine and connect with Palliative services for home care.  Will follow for transition assistance.  Cindra Presume, RN-BC, MSN, [facility]

## 2016-02-10 NOTE — Progress Notes (Signed)
Patient ID: Frank Davidson, male   DOB: 1951-05-16, 65 y.o.   MRN: [id number]     Advanced Heart Failure Rounding Note  PCP: Dr. Shela Commons. [place] Nephrologist: Dr. Briant Cedar HF: Bensimhon  Subjective:    Echo 02/04/16 LVEF reduced to 10-15% from 25-30% in 08/2015  RHC (3/17): RA mean 16 PA 70/31 mean 47 PCWP mean 25 CI 1.6  Swan numbers this morning: RA mean 6 PA 62/32  PCWP mean 23 CI 1.7 Co-ox 66%  Milrinone stopped 3/17 and dobutamine 5 mcg/kg/min begun.  Creatinine a bit up today but still around his baseline.    Over the weekend he was confused and had head CT -- No  acute abnormality. Yesterday coreg was stopped.   Today he is A and O x 3. Denies SOB/Orthopnea.  Co-ox 62%.       Objective:   Weight Range: 209 lb 9.6 oz (95.074 kg) Body mass index is 30.07 kg/(m^2).   Vital Signs:   Temp:  [97 F (36.1 C)-98.1 F (36.7 C)] 98 F (36.7 C) (03/21 0400) Pulse Rate:  [54-90] 82 (03/21 0600) Resp:  [10-22] 11 (03/21 0600) BP: (104-141)/(62-92) 129/86 mmHg (03/21 0600) SpO2:  [93 %-100 %] 98 % (03/21 0600) Weight:  [209 lb 9.6 oz (95.074 kg)] 209 lb 9.6 oz (95.074 kg) (03/21 0500) Last BM Date: 02/09/16  Weight change: Filed Weights   02/08/16 0332 02/09/16 0400 02/10/16 0500  Weight: 215 lb 13.3 oz (97.9 kg) 206 lb 2.1 oz (93.5 kg) 209 lb 9.6 oz (95.074 kg)    Intake/Output:   Intake/Output Summary (Last 24 hours) at 02/10/16 0705 Last data filed at 02/10/16 0600  Gross per 24 hour  Intake 784.53 ml  Output   2388 ml  Net -1603.47 ml     Physical Exam: General: Obese, pleasant HEENT: normal RIJ swan sleeve.  Neck: supple. JVP 6-7  cm. Carotids 2+ bilat; no bruits. No thyromegaly or nodule noted. Cor: PMI nondisplaced. irregularly irregular.  Lungs: Slightly diminished bases. Abdomen: Obese, soft, NT, mildly distended, no HSM. No bruits or masses. +BS  Extremities: no cyanosis, clubbing, rash. No edema.  LUE PICC Neuro: alert & orientedx3, cranial  nerves grossly intact. moves all 4 extremities w/o difficulty. Affect pleasant  Telemetry: Atrial flutter with 2:1 block in 70-80s  Labs: CBC  Recent Labs  02/08/16 0340 02/09/16 0345  WBC 12.5* 11.8*  HGB 12.3* 13.7  HCT 38.5* 43.5  MCV 85.4 87.2  PLT 139* 136*   Basic Metabolic Panel  Recent Labs  02/09/16 0345 02/10/16 0326  NA 139 135  K 4.2 4.3  CL 94* 94*  CO2 32 29  GLUCOSE 66 112*  BUN 69* 62*  CREATININE 1.95* 2.20*  CALCIUM 10.0 9.9   Liver Function Tests No results for input(s): AST, ALT, ALKPHOS, BILITOT, PROT, ALBUMIN in the last 72 hours. No results for input(s): LIPASE, AMYLASE in the last 72 hours. Cardiac Enzymes No results for input(s): CKTOTAL, CKMB, CKMBINDEX, TROPONINI in the last 72 hours.  BNP: BNP (last 3 results)  Recent Labs  12/11/15 2345 01/13/16 1544 02/03/16 0115  BNP 1024.0* 843.5* 750.3*    ProBNP (last 3 results) No results for input(s): PROBNP in the last 8760 hours.   D-Dimer No results for input(s): DDIMER in the last 72 hours. Hemoglobin A1C No results for input(s): HGBA1C in the last 72 hours. Fasting Lipid Panel No results for input(s): CHOL, HDL, LDLCALC, TRIG, CHOLHDL, LDLDIRECT in the last 72 hours. Thyroid Function  Tests No results for input(s): TSH, T4TOTAL, T3FREE, THYROIDAB in the last 72 hours.  Invalid input(s): FREET3  Other results:     Imaging/Studies:  Dg Chest Port 1 View  02/09/2016  CLINICAL DATA:  Line placement. EXAM: PORTABLE CHEST 1 VIEW COMPARISON:  02/02/2016 FINDINGS: An left PICC catheter has been placed with tip over the lower SVC region. No pneumothorax. Pain right jugular central venous catheter is in place with tip over the upper SVC. No pneumothorax. Shallow inspiration. Heart size and pulmonary vascularity are normal for technique. Mild linear atelectasis in the mid lungs. No focal consolidation. No blunting of costophrenic angles. Mediastinal contours appear intact.  IMPRESSION: Left PICC catheter with tip over the lower SVC region. Right central venous catheter with tip over the upper SVC region. No pneumothorax. Electronically Signed   By: Burman Nieves M.D.   On: 02/09/2016 20:05    Latest Echo  Latest Cath   Medications:     Scheduled Medications: . allopurinol  100 mg Oral Daily  . budesonide (PULMICORT) nebulizer solution  0.25 mg Nebulization BID  . docusate sodium  100 mg Oral BID  . hydrALAZINE  37.5 mg Oral TID  . insulin aspart  0-9 Units Subcutaneous TID WC  . isosorbide mononitrate  60 mg Oral Daily  . omega-3 acid ethyl esters  1 g Oral Daily  . potassium chloride SA  40 mEq Oral BID  . sodium chloride flush  10-40 mL Intracatheter Q12H  . sodium chloride flush  10-40 mL Intracatheter Q12H  . sodium chloride flush  3 mL Intravenous Q12H  . sodium chloride flush  3 mL Intravenous Q12H  . torsemide  100 mg Oral BID  . Warfarin - Pharmacist Dosing Inpatient   Does not apply q1800    Infusions: . DOBUTamine 5 mcg/kg/min (02/09/16 1228)  . heparin 1,150 Units/hr (02/09/16 1233)    PRN Medications: sodium chloride, sodium chloride, acetaminophen **OR** acetaminophen, acetaminophen, albuterol, baclofen, ondansetron **OR** ondansetron (ZOFRAN) IV, ondansetron (ZOFRAN) IV, oxyCODONE-acetaminophen **AND** oxyCODONE, sodium chloride flush, sodium chloride flush, sodium chloride flush, sodium chloride flush   Assessment   1. Acute respiratory failure with hypoxia 2. Acute on chronic systolic CHF with cardiogenic shock, now on dobutamine.  3. 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 Chronic Afib 5. HTN 6. DM2 7. OSA: does not use CPAP.  8. Delirium  Plan    He is now on dobutamine at 5, co-ox 62%. Continue  torsemide 100 mg twice a day. Renal function trending up. Increase hydralazine 50 mg tid and Imdur 60 daily. Keep off Coreg for now given dobutamine use.  I do not think that he is going to be able to titrate off dobutamine  but will lower dose today to 4 mcg/kg/min to see how he tolerates a slow wean.   Place PICC single lumen for home dobutamine.    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  Long-term, being on dobutamine may be difficult because of his atrial arrhythmias and the dobutamine antagonism that we get from beta blocker use.  However, he did better with dobutamine than milrinone (cardiac output stayed quite low on milrinone).   Continue heparin gtt for now for atrial fibrillation. Started coumadin. INR 1.20   BUN/creatinine around his baseline.  HD would be very difficult if he continues to require inotropes.  Hopefully we can avoid this.  Confusion has resolved, may have been related to low output.   Consult  palliative care for goals of care. He has refused ICD in the past.  Discussed at length that home dobutamine will not extend his life but will hopefully improve quality.   Transfer to stepdown. Set up CVP. D/C foley.   Length of Stay: 7  Amy Clegg NP-C  02/10/2016, 7:05 AM  Advanced Heart Failure Team Pager [phone] (M-F; 7a - 4p)  Please contact [facility] for night-coverage after hours (4p -7a ) and weekends on [email or web address]  Patient seen with NP, agree with the above note.  He is stable on dobutamine and back on home dose of torsemide.  Creatinine a bit higher today, will need to follow closely.  I will try a slow wean of the dobutamine, but I think that he is going to be dependent on this at home.  - Decrease dobutamine to 4 mcg/kg/min, co-ox in am.  - Continue current torsemide. - Increase hydralazine to 50 tid, continue Imdur. - HR stable currently in atypical atrial flutter, will need to follow closely with dobutamine.  - Difficult candidate for LVAD given renal dysfunction.   Marca Ancona 02/10/2016 7:49 AM

## 2016-02-11 LAB — GLUCOSE, CAPILLARY
GLUCOSE-CAPILLARY: 131 mg/dL — AB (ref 65–99)
GLUCOSE-CAPILLARY: 109 mg/dL — AB (ref 65–99)
Glucose-Capillary: 99 mg/dL (ref 65–99)
Glucose-Capillary: 125 mg/dL — ABNORMAL HIGH (ref 65–99)

## 2016-02-11 LAB — PROTIME-INR
INR: 1.21 (ref 0.00–1.49)
PROTHROMBIN TIME: 15.5 s — AB (ref 11.6–15.2)

## 2016-02-11 LAB — APTT: aPTT: 83 seconds — ABNORMAL HIGH (ref 24–37)

## 2016-02-11 LAB — BASIC METABOLIC PANEL
ANION GAP: 8 (ref 5–15)
BUN: 43 mg/dL — AB (ref 6–20)
CO2: 28 mmol/L (ref 22–32)
Calcium: 9.8 mg/dL (ref 8.9–10.3)
Chloride: 95 mmol/L — ABNORMAL LOW (ref 101–111)
Creatinine, Ser: 1.96 mg/dL — ABNORMAL HIGH (ref 0.61–1.24)
GFR calc Af Amer: 40 mL/min — ABNORMAL LOW (ref >60)
GFR, EST NON AFRICAN AMERICAN: 34 mL/min — AB (ref >60)
Glucose, Bld: 119 mg/dL — ABNORMAL HIGH (ref 65–99)
POTASSIUM: 4 mmol/L (ref 3.5–5.1)
Sodium: 131 mmol/L — ABNORMAL LOW (ref 135–145)

## 2016-02-11 LAB — CARBOXYHEMOGLOBIN
CARBOXYHEMOGLOBIN: 1.5 % (ref 0.5–1.5)
METHEMOGLOBIN: 0.8 % (ref 0.0–1.5)
O2 SAT: 62.2 %
Total hemoglobin: 12.9 g/dL — ABNORMAL LOW (ref 13.5–18.0)

## 2016-02-11 LAB — HEPARIN LEVEL (UNFRACTIONATED): Heparin Unfractionated: 0.6 IU/mL (ref 0.30–0.70)

## 2016-02-11 MED ORDER — WARFARIN SODIUM 7.5 MG PO TABS
15.0000 mg | ORAL_TABLET | Freq: Once | ORAL | Status: AC
  Administered 2016-02-11: 15 mg via ORAL
  Filled 2016-02-11: qty 2

## 2016-02-11 MED ORDER — HYDRALAZINE HCL 50 MG PO TABS
75.0000 mg | ORAL_TABLET | Freq: Three times a day (TID) | ORAL | Status: DC
  Administered 2016-02-11 (×3): 75 mg via ORAL
  Filled 2016-02-11 (×3): qty 1

## 2016-02-11 MED ORDER — DOBUTAMINE IN D5W 4-5 MG/ML-% IV SOLN
2.0000 ug/kg/min | INTRAVENOUS | Status: DC
  Administered 2016-02-11: 2 ug/kg/min via INTRAVENOUS

## 2016-02-11 MED ORDER — ISOSORBIDE MONONITRATE ER 60 MG PO TB24
90.0000 mg | ORAL_TABLET | Freq: Every day | ORAL | Status: DC
  Administered 2016-02-11: 90 mg via ORAL
  Filled 2016-02-11: qty 1

## 2016-02-11 NOTE — Progress Notes (Signed)
PT Cancellation Note  Patient Details Name: Frank Davidson MRN: [id number] DOB: May 17, 1951   Cancelled Treatment:    Reason Eval/Treat Not Completed:  Patient had been walking with Cardiac Rehab recently. Will try back as time allows.   Fabio Asa 02/11/2016, 1:21 PM  Charlotte Crumb, PT DPT  [phone]

## 2016-02-11 NOTE — Progress Notes (Signed)
Patient ID: Frank Davidson, male   DOB: 1951-08-10, 65 y.o.   MRN: [id number]     Advanced Heart Failure Rounding Note  PCP: Dr. Shela Commons. [place] Nephrologist: Dr. Briant Cedar HF: Bensimhon  Subjective:    Echo 02/04/16 LVEF reduced to 10-15% from 25-30% in 08/2015  RHC (3/17): RA mean 16 PA 70/31 mean 47 PCWP mean 25 CI 1.6  Swan numbers this morning: RA mean 6 PA 62/32  PCWP mean 23 CI 1.7 Co-ox 66%  Milrinone stopped 3/17 and dobutamine 5 mcg/kg/min begun.  Creatinine a bit up today but still around his baseline.    Over the weekend he was confused and had head CT -- No  acute abnormality. Yesterday coreg was stopped.   Yesterday dobutamine was cut back to 4 mcg.  Today CO-OX 62%.   Overall feeling great. Denies SOB/PND/Orthopnea.          Objective:   Weight Range: 209 lb 14.1 oz (95.2 kg) Body mass index is 30.11 kg/(m^2).   Vital Signs:   Temp:  [97.7 F (36.5 C)-98 F (36.7 C)] 98 F (36.7 C) (03/22 0000) Pulse Rate:  [56-88] 82 (03/22 0500) Resp:  [10-19] 10 (03/22 0500) BP: (106-170)/(80-156) 131/80 mmHg (03/22 0400) SpO2:  [94 %-100 %] 94 % (03/22 0500) Weight:  [209 lb 14.1 oz (95.2 kg)] 209 lb 14.1 oz (95.2 kg) (03/22 0335) Last BM Date: 02/09/16  Weight change: Filed Weights   02/09/16 0400 02/10/16 0500 02/11/16 0335  Weight: 206 lb 2.1 oz (93.5 kg) 209 lb 9.6 oz (95.074 kg) 209 lb 14.1 oz (95.2 kg)    Intake/Output:   Intake/Output Summary (Last 24 hours) at 02/11/16 0710 Last data filed at 02/11/16 0600  Gross per 24 hour  Intake  452.9 ml  Output   1360 ml  Net -907.1 ml     Physical Exam: CVP 4-5  General: Obese, pleasant HEENT: normal  Neck: supple. JVP 6-7  cm. Carotids 2+ bilat; no bruits. No thyromegaly or nodule noted. Cor: PMI nondisplaced. irregularly irregular.  Lungs: Slightly diminished bases. Abdomen: Obese, soft, NT, mildly distended, no HSM. No bruits or masses. +BS  Extremities: no cyanosis, clubbing, rash. No  edema.  LUE PICC Neuro: alert & orientedx3, cranial nerves grossly intact. moves all 4 extremities w/o difficulty. Affect pleasant  Telemetry: Atrial flutter with 2:1 block in 70-80s  Labs: CBC  Recent Labs  02/09/16 0345  WBC 11.8*  HGB 13.7  HCT 43.5  MCV 87.2  PLT 136*   Basic Metabolic Panel  Recent Labs  02/10/16 0326 02/11/16 0347  NA 135 131*  K 4.3 4.0  CL 94* 95*  CO2 29 28  GLUCOSE 112* 119*  BUN 62* 43*  CREATININE 2.20* 1.96*  CALCIUM 9.9 9.8   Liver Function Tests No results for input(s): AST, ALT, ALKPHOS, BILITOT, PROT, ALBUMIN in the last 72 hours. No results for input(s): LIPASE, AMYLASE in the last 72 hours. Cardiac Enzymes No results for input(s): CKTOTAL, CKMB, CKMBINDEX, TROPONINI in the last 72 hours.  BNP: BNP (last 3 results)  Recent Labs  12/11/15 2345 01/13/16 1544 02/03/16 0115  BNP 1024.0* 843.5* 750.3*    ProBNP (last 3 results) No results for input(s): PROBNP in the last 8760 hours.   D-Dimer No results for input(s): DDIMER in the last 72 hours. Hemoglobin A1C No results for input(s): HGBA1C in the last 72 hours. Fasting Lipid Panel No results for input(s): CHOL, HDL, LDLCALC, TRIG, CHOLHDL, LDLDIRECT in the last 72  hours. Thyroid Function Tests No results for input(s): TSH, T4TOTAL, T3FREE, THYROIDAB in the last 72 hours.  Invalid input(s): FREET3  Other results:     Imaging/Studies:  Dg Chest Port 1 View  02/09/2016  CLINICAL DATA:  Line placement. EXAM: PORTABLE CHEST 1 VIEW COMPARISON:  02/02/2016 FINDINGS: An left PICC catheter has been placed with tip over the lower SVC region. No pneumothorax. Pain right jugular central venous catheter is in place with tip over the upper SVC. No pneumothorax. Shallow inspiration. Heart size and pulmonary vascularity are normal for technique. Mild linear atelectasis in the mid lungs. No focal consolidation. No blunting of costophrenic angles. Mediastinal contours appear  intact. IMPRESSION: Left PICC catheter with tip over the lower SVC region. Right central venous catheter with tip over the upper SVC region. No pneumothorax. Electronically Signed   By: Burman Nieves M.D.   On: 02/09/2016 20:05    Latest Echo  Latest Cath   Medications:     Scheduled Medications: . allopurinol  100 mg Oral Daily  . budesonide (PULMICORT) nebulizer solution  0.25 mg Nebulization BID  . docusate sodium  100 mg Oral BID  . hydrALAZINE  50 mg Oral TID  . insulin aspart  0-9 Units Subcutaneous TID WC  . isosorbide mononitrate  60 mg Oral Daily  . omega-3 acid ethyl esters  1 g Oral Daily  . potassium chloride SA  40 mEq Oral BID  . sodium chloride flush  10-40 mL Intracatheter Q12H  . sodium chloride flush  10-40 mL Intracatheter Q12H  . sodium chloride flush  3 mL Intravenous Q12H  . sodium chloride flush  3 mL Intravenous Q12H  . torsemide  100 mg Oral BID  . Warfarin - Pharmacist Dosing Inpatient   Does not apply q1800    Infusions: . DOBUTamine 4 mcg/kg/min (02/11/16 0336)  . heparin 1,150 Units/hr (02/10/16 1119)    PRN Medications: sodium chloride, sodium chloride, acetaminophen **OR** acetaminophen, acetaminophen, albuterol, baclofen, ondansetron **OR** ondansetron (ZOFRAN) IV, ondansetron (ZOFRAN) IV, oxyCODONE-acetaminophen **AND** oxyCODONE, sodium chloride flush, sodium chloride flush, sodium chloride flush, sodium chloride flush   Assessment   1. Acute respiratory failure with hypoxia 2. Acute on chronic systolic CHF with cardiogenic shock, now on dobutamine.  3. 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 Chronic Afib 5. HTN 6. DM2 7. OSA: does not use CPAP.  8. Delirium  Plan    He is now on dobutamine at 4 co-ox 62%. Cut back to 3 mcg today. Continue  torsemide 100 mg twice a day. Renal function leveled off. Continue  hydralazine 50 mg tid and Imdur 60 daily. Keep off Coreg for now given dobutamine use.   Place PICC single lumen for home  dobutamine.    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  Long-term, being on dobutamine may be difficult because of his atrial arrhythmias and the dobutamine antagonism that we get from beta blocker use.  However, he did better with dobutamine than milrinone (cardiac output stayed quite low on milrinone).   Continue heparin gtt for now for atrial fibrillation. Started coumadin. INR 1.20   BUN/creatinine around his baseline.  HD would be very difficult if he continues to require inotropes.  Hopefully we can avoid this.  Confusion has resolved, may have been related to low output.   Palliative Care appreciated. Continue full code.  He has refused ICD in the past.  Discussed at length that home dobutamine will not  extend his life but will hopefully improve quality.   Length of Stay: 8  Amy Clegg NP-C  02/11/2016, 7:10 AM  Advanced Heart Failure Team Pager [phone] (M-F; 7a - 4p)  Please contact [facility] for night-coverage after hours (4p -7a ) and weekends on [email or web address]  Patient seen with NP, agree with the above note.  CVP 4-5 today, creatinine relatively stable.  Good co-ox on lower dobutamine.   - Decrease dobutamine today to 2 mcg/kg/min.  - Increase hydralazine to 75 mg tid with Imdur 90 daily.   - Continue to walk halls.  HR ok off Coreg, he is in atypical flutter.  Continue heparin gtt/coumadin.   He now is interested in ICD. However, if he requires home inotropes he would not be a good ICD candidate unless we plan LVAD (which would be difficult given renal dysfunction).   Marca Ancona 02/11/2016 8:00 AM

## 2016-02-11 NOTE — Progress Notes (Signed)
CARDIAC REHAB PHASE I   PRE:  Rate/Rhythm: 67 SR  BP:  Supine: 116/78  Sitting:   Standing:    SaO2: 99 RA  MODE:  Ambulation: 150 ft   POST:  Rate/Rhythm: 174 with rest 108 ST  BP:  Supine:   Sitting: 129/93  Standing:    SaO2: 95 RA 1145-1210 On arrival pt in bed without c/o. Assisted X 2 used walker and gait belt to ambulate. Gait steady with walker. Pt tires walking and c/o of SOB by end of walk. HR 174 by end of walk. Pt to recliner after walk with call light in reach. Pt's HR decreased rapidly with in a minute of sitting in recliner down to 108. Reported HR increase to RN.  Melina Copa RN 02/11/2016 12:07 PM

## 2016-02-11 NOTE — Progress Notes (Signed)
ANTICOAGULATION CONSULT NOTE - Follow Up Consult  Pharmacy Consult for heparin/warfarin Indication: atrial fibrillation  Allergies  Allergen Reactions  . Corlanor [Ivabradine] Palpitations and Other (See Comments)    Kidney and heart problems, syncope  . Entresto [Sacubitril-Valsartan]     Syncope  . Januvia [Sitagliptin] Other (See Comments)    "Almost killed me"   . Lunesta [Eszopiclone] Palpitations and Other (See Comments)    Kidney and heart problems, syncope  . Actos [Pioglitazone] Other (See Comments)    Caused blood in urine and fluid retention     Patient Measurements: Height: 5\' 10"  (177.8 cm) Weight: 209 lb 14.1 oz (95.2 kg) IBW/kg (Calculated) : 73 Heparin Dosing Weight: 94 kg  Vital Signs: Temp: 98 F (36.7 C) (03/22 0000) Temp Source: Oral (03/22 0000) BP: 131/80 mmHg (03/22 0400) Pulse Rate: 82 (03/22 0500)  Labs:  Recent Labs  02/09/16 0345 02/10/16 0326 02/11/16 0347  HGB 13.7  --   --   HCT 43.5  --   --   PLT 136*  --   --   APTT 99* 78* 83*  LABPROT  --  15.3* 15.5*  INR  --  1.20 1.21  HEPARINUNFRC 1.50* 0.88* 0.60  CREATININE 1.95* 2.20* 1.96*    Estimated Creatinine Clearance: 43.5 mL/min (by C-G formula based on Cr of 1.96).  Assessment: 65 yo M admitted 02/02/2016 with SOB and edema. Echo on 3/15 shows reduced LVEF of 10-15% from 25-30% in 08/2015. Patient now has cardiorenal syndrome and possible need for CRRT. Patient is on Eliquis PTA for Afib with last dose on 3/17 at 1124. Hep gtt started 12 hrs after last dose of apixaban. Will dose off aPTTs until correlating with HL. Now to bridge to warfarin instead of resuming apixaban due to decline in renal function.   aPTT 83 (therapeutic), HL 0.60 (due to Eliquis), Hgb 13.7, Plt 136. No s/sx of bleeding noted.  Goal of Therapy:  Heparin level 0.3-0.7 units/ml  aPTT 66-102s Monitor platelets by anticoagulation protocol: Yes   Plan:  - Continue heparin at 1150 units/hr - Warfarin  15mg *1 tonight - Monitor daily aPTT, HL, CBC, INR and s/sx of bleeding   Sherron Monday, PharmD Clinical Pharmacy Resident Pager: [phone] 02/11/2016 7:20 AM

## 2016-02-11 NOTE — Progress Notes (Signed)
   02/11/16 1416  Clinical Encounter Type  Visited With Patient;Health care provider  Visit Type Follow-up  Referral From Patient  Spiritual Encounters  Spiritual Needs Literature   Chaplain worked with our department's notary and hospital volunteers to complete the advance directive forms. Chaplain passed forms along to the unit secretary to put a copy in patient's chart. Chaplain support available as needed.   Alda Ponder, Chaplain 02/11/2016 2:17 PM

## 2016-02-11 NOTE — Progress Notes (Signed)
   02/11/16 1118  Clinical Encounter Type  Visited With Patient;Health care provider  Visit Type Initial;Critical Care  Referral From Patient;Palliative care team  [place] responded to a request from Palliative Care to help patient facilitate an advanced directive. Chaplain met with patient and assisted with him filling out the [facility] and living will forms. Chaplain will help patient complete the form with notary and witnesses today. Spiritual care services available as needed.   Jeri Lager, Chaplain 02/11/2016 11:21 AM

## 2016-02-12 ENCOUNTER — Ambulatory Visit: Payer: Commercial Managed Care - HMO | Admitting: Family Medicine

## 2016-02-12 LAB — CARBOXYHEMOGLOBIN
Carboxyhemoglobin: 1.4 % (ref 0.5–1.5)
METHEMOGLOBIN: 0.6 % (ref 0.0–1.5)
O2 Saturation: 54 %
TOTAL HEMOGLOBIN: 17.1 g/dL (ref 13.5–18.0)

## 2016-02-12 LAB — CBC
HCT: 44.1 % (ref 39.0–52.0)
Hemoglobin: 14 g/dL (ref 13.0–17.0)
MCH: 27.1 pg (ref 26.0–34.0)
MCHC: 31.7 g/dL (ref 30.0–36.0)
MCV: 85.5 fL (ref 78.0–100.0)
Platelets: 161 10*3/uL (ref 150–400)
RBC: 5.16 MIL/uL (ref 4.22–5.81)
RDW: 17.7 % — AB (ref 11.5–15.5)
WBC: 14.7 10*3/uL — AB (ref 4.0–10.5)

## 2016-02-12 LAB — HEPARIN LEVEL (UNFRACTIONATED): HEPARIN UNFRACTIONATED: 0.45 [IU]/mL (ref 0.30–0.70)

## 2016-02-12 LAB — BASIC METABOLIC PANEL
Anion gap: 14 (ref 5–15)
BUN: 36 mg/dL — AB (ref 6–20)
CO2: 24 mmol/L (ref 22–32)
CREATININE: 1.93 mg/dL — AB (ref 0.61–1.24)
Calcium: 10.1 mg/dL (ref 8.9–10.3)
Chloride: 97 mmol/L — ABNORMAL LOW (ref 101–111)
GFR calc Af Amer: 40 mL/min — ABNORMAL LOW (ref >60)
GFR, EST NON AFRICAN AMERICAN: 35 mL/min — AB (ref >60)
GLUCOSE: 112 mg/dL — AB (ref 65–99)
POTASSIUM: 4.6 mmol/L (ref 3.5–5.1)
SODIUM: 135 mmol/L (ref 135–145)

## 2016-02-12 LAB — GLUCOSE, CAPILLARY
GLUCOSE-CAPILLARY: 125 mg/dL — AB (ref 65–99)
GLUCOSE-CAPILLARY: 129 mg/dL — AB (ref 65–99)
Glucose-Capillary: 140 mg/dL — ABNORMAL HIGH (ref 65–99)

## 2016-02-12 LAB — APTT: APTT: 105 s — AB (ref 24–37)

## 2016-02-12 LAB — PROTIME-INR
INR: 1.58 — AB (ref 0.00–1.49)
Prothrombin Time: 18.9 seconds — ABNORMAL HIGH (ref 11.6–15.2)

## 2016-02-12 MED ORDER — WARFARIN SODIUM 7.5 MG PO TABS
15.0000 mg | ORAL_TABLET | Freq: Once | ORAL | Status: DC
Start: 2016-02-12 — End: 2016-02-12

## 2016-02-12 MED ORDER — CALCIUM CARBONATE ANTACID 500 MG PO CHEW
1.0000 | CHEWABLE_TABLET | ORAL | Status: DC | PRN
  Administered 2016-02-12 (×3): 400 mg via ORAL
  Filled 2016-02-12 (×3): qty 2

## 2016-02-12 MED ORDER — TORSEMIDE 20 MG PO TABS: 100.0000 mg | ORAL_TABLET | Freq: Two times a day (BID) | ORAL | Status: DC

## 2016-02-12 MED ORDER — HYDRALAZINE HCL 100 MG PO TABS: 100.0000 mg | ORAL_TABLET | Freq: Three times a day (TID) | ORAL | Status: AC

## 2016-02-12 MED ORDER — WARFARIN SODIUM 10 MG PO TABS: ORAL_TABLET | ORAL | Status: DC

## 2016-02-12 MED ORDER — DOBUTAMINE IN D5W 4-5 MG/ML-% IV SOLN: 2.0000 ug/kg/min | INTRAVENOUS | Status: DC

## 2016-02-12 MED ORDER — HYDRALAZINE HCL 50 MG PO TABS
100.0000 mg | ORAL_TABLET | Freq: Three times a day (TID) | ORAL | Status: DC
  Administered 2016-02-12 (×2): 100 mg via ORAL
  Filled 2016-02-12 (×2): qty 2

## 2016-02-12 MED ORDER — ISOSORBIDE MONONITRATE ER 60 MG PO TB24
120.0000 mg | ORAL_TABLET | Freq: Every day | ORAL | Status: DC
  Administered 2016-02-12: 120 mg via ORAL
  Filled 2016-02-12: qty 2

## 2016-02-12 MED ORDER — WARFARIN SODIUM 7.5 MG PO TABS
15.0000 mg | ORAL_TABLET | Freq: Once | ORAL | Status: AC
  Administered 2016-02-12: 15 mg via ORAL
  Filled 2016-02-12: qty 2

## 2016-02-12 MED ORDER — COUMADIN BOOK
Freq: Once | Status: AC
  Administered 2016-02-12: 08:00:00
  Filled 2016-02-12: qty 1

## 2016-02-12 MED ORDER — METOLAZONE 2.5 MG PO TABS: 2.5000 mg | ORAL_TABLET | ORAL | Status: DC | PRN

## 2016-02-12 MED ORDER — ISOSORBIDE MONONITRATE ER 120 MG PO TB24: 120.0000 mg | ORAL_TABLET | Freq: Every day | ORAL | Status: AC

## 2016-02-12 NOTE — Progress Notes (Signed)
Advanced Directives completed on 3/22 inpatient, hard copies to be scanned to chart

## 2016-02-12 NOTE — Progress Notes (Signed)
Spoke w [facility]. They will hook up to home dobutamine prior to disch. Spoke w [facility] phy assit and pt wants full aggressive care at present and does not want care connect consult yet. [facility] aware they will be agency providing all care. For dc home today.

## 2016-02-12 NOTE — Progress Notes (Signed)
Patient discharged via wheelchair with family. Patient also discharged to home with Dobutamine pump via PICC (per [facility]) with all the home care accessories in box with discharge instructions.  All instructions and prescription given and questions and concerns addressed prior to discharge.  Patient awake, alert and oriented at time of discharge. PIVs also discontinued with dressing at site.

## 2016-02-12 NOTE — Progress Notes (Addendum)
CARDIAC REHAB PHASE I   PRE:  Rate/Rhythm: 89 ? SR    BP: sitting 113/82    SaO2:   MODE:  Ambulation: 300 ft   POST:  Rate/Rhythm: 86 with PVC then 176 afib in BR    BP: sitting 127/88     SaO2:   Pt ambulated with RW, independent, I pushed IV. Rhythm difficult to see on monitor but seemed to stay in 80s walking. However pt to BR for BM and HR up to 170s for less than 1 min. Pt sts he was not aware. Reviewed ed. Pt able to st important information to control HF. He has handouts/book at home. Gave Afib book and ex gl. Pt not interested in CRPII at this time. [id number]   Harriet Masson CES, ACSM 02/12/2016 10:39 AM

## 2016-02-12 NOTE — Discharge Summary (Signed)
Advanced Heart Failure Team  Discharge Summary   Patient ID: Frank Davidson MRN: [id number], DOB/AGE: 66-27-1952 21 y.o. Admit date: 02/02/2016 D/C date:     02/12/2016   Primary Discharge Diagnoses:  1. Acute respiratory failure with hypoxia 2. Acute on chronic systolic CHF with cardiogenic shock, now on dobutamine 2 mcg.   3. 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 Chronic Afib 5. HTN 6. DM2 7. OSA: does not use CPAP.  8. Delirium 9. Hypokalemia 10.Cardiogenic Shock- Requiring intorppes.   Hospital Course:   Frank Davidson is a 65 y.o. male with a hx of obesity, PAF, HTN, COPD, HTN, gout, DM 2, CVA and CHF EF 25% due to NICM who presents to [facility] with c/o of worsening SOB and weight gain. Admitted by [facility] but transferred to Advanced Heart Failure Team for management.   1. Acute respiratory failure with hypoxia- Noted on admit. Felt to be multifactorial from COPD exacerbation and acute systolic heart failure. Much improved after bronchodilators, diuretics-back on room air. 2. Acute on chronic systolic CHF with cardiogenic shock. Initially diuresed with IV lasix but due to poor response he RHC that showed Cardiogenic Shock with elevated filling pressures. Started on milrinone and lasix drip. Mixed venous saturation remained low and he was switched to dobutamine with improved mixed venous saturation. Unable to wean dobutamine. He will continue 2 mcg dobutamine continuously. We will try to wean in the community. He was counseled that dobutamine was for quality of life and not to extend life. He understands if we can wean dobutamine off we can consider ICD at that time.  Once fully diuresed he transitioned to torsemide 100 mg twice a day. Overall he diuresed  19 pounds.  He was not placed on Ace due to CKD. He will continue on hydralazine/imdur. He is not on bb due to cardiogenic shock.  3. Acute renal failure on CKD stage IV- Nephrology consulted due to worsening renal  function. Fortunately he did not require dialysis. Creatinine peaked 3.5 but on inotropes trended down to 1.93.  4. Cardiogenic Shock- Noted on RHC. Placed on milrinone but due to low mixed venous sat he was switched to dobutamine. Requiring home inotropes.  5. Chronic Afib/Flutter- rate was controlled off BB. Continue coumadin. Plan to set up [facility]. INR allowed to drift with coumadin.  6.  HTN- Monitored closely with adjustments made to medicaitons.  7. DM2- Glucose has been stable. Not requiring sliding scale coverage. Stop glipizide.  8. OSA: does not use CPAP.  9.  Delirium- Had CT of head that was negative. Resolved.   Procedures RHC 02/06/2016 RA = 16 RV = 71/8/14 PA = 70/31 (47) PCW = 25 Fick cardiac output/index = 3.7/1.6 Thermo CO/CI = 3.4/1.5 PVR = 6.4 WU Ao sat = 95% PA sat = 38%, 46%  Consults: Palliative Care and he requested to continue full code status.                   Nephrology  He will continue to be followed closely in the HF clinic and will be seen next week.    Discharge Weight: 208 pounds.   Discharge Vitals: Blood pressure 127/83, pulse 82, temperature 97.7 F (36.5 C), temperature source Oral, resp. rate 16, height  (1.778 m), weight 208 lb 8.9 oz (94.6 kg), SpO2 98 %.  Labs: Lab Results  Component Value Date   WBC 14.7* 02/12/2016   HGB 14.0 02/12/2016   HCT 44.1  02/12/2016   MCV 85.5 02/12/2016   PLT 161 02/12/2016    Recent Labs Lab 02/12/16 0400  NA 135  K 4.6  CL 97*  CO2 24  BUN 36*  CREATININE 1.93*  CALCIUM 10.1  GLUCOSE 112*   Lab Results  Component Value Date   CHOL 157 12/12/2015   HDL 46 12/12/2015   LDLCALC 81 12/12/2015   TRIG 149 12/12/2015   BNP (last 3 results)  Recent Labs  12/11/15 2345 01/13/16 1544 02/03/16 0115  BNP 1024.0* 843.5* 750.3*    ProBNP (last 3 results) No results for input(s): PROBNP in the last 8760 hours.   Diagnostic Studies/Procedures   No results  found.  Discharge Medications     Medication List    STOP taking these medications        apixaban 5 MG Tabs tablet  Commonly known as:  ELIQUIS     carvedilol 12.5 MG tablet  Commonly known as:  COREG     glipiZIDE 5 MG tablet  Commonly known as:  GLUCOTROL      TAKE these medications        albuterol 108 (90 Base) MCG/ACT inhaler  Commonly known as:  PROVENTIL HFA;VENTOLIN HFA  Inhale 2 puffs into the lungs every 6 (six) hours as needed for wheezing or shortness of breath.     albuterol (2.5 MG/3ML) 0.083% nebulizer solution  Commonly known as:  PROVENTIL  Take 3 mLs (2.5 mg total) by nebulization every 4 (four) hours as needed for wheezing or shortness of breath.     allopurinol 100 MG tablet  Commonly known as:  ZYLOPRIM  TAKE 1 TABLET (100 MG TOTAL) BY MOUTH DAILY.     baclofen 10 MG tablet  Commonly known as:  LIORESAL  TAKE 1 TABLET EVERY 8 HOURS AS NEEDED FOR MUSCLE SPASM(S)     diclofenac 1.3 % Ptch  Commonly known as:  FLECTOR  Place 1 patch onto the skin 2 (two) times daily as needed (pain).     DOBUTamine 4-5 MG/ML-% infusion  Commonly known as:  DOBUTREX  Inject 190.4 mcg/min into the vein continuous. From [facility] pharmacy x 12 months     DSS 100 MG Caps  Take 100 mg by mouth 2 (two) times daily.     hydrALAZINE 100 MG tablet  Commonly known as:  APRESOLINE  Take 1 tablet (100 mg total) by mouth 3 (three) times daily.     ipratropium-albuterol 0.5-2.5 (3) MG/3ML Soln  Commonly known as:  DUONEB  Take 3 mLs by nebulization 4 (four) times daily.     isosorbide mononitrate 120 MG 24 hr tablet  Commonly known as:  IMDUR  Take 1 tablet (120 mg total) by mouth daily.     metolazone 2.5 MG tablet  Commonly known as:  ZAROXOLYN  Take 1 tablet (2.5 mg total) by mouth as needed. Tuesday and Friday     multivitamin with minerals tablet  Take 1 tablet by mouth daily.     omega-3 acid ethyl esters 1 g capsule  Commonly known as:  LOVAZA  Take 1 g by  mouth daily.     oxyCODONE-acetaminophen 10-325 MG tablet  Commonly known as:  PERCOCET  Take 1 tablet by mouth every 8 (eight) hours as needed for pain. Use with stool softerner.     polyethylene glycol powder powder  Commonly known as:  GLYCOLAX/MIRALAX  MIX 1 CAPFUL (17GM)  IN  LIQUID  AND  DRINK EVERY DAY AS DIRECTED  potassium chloride SA 20 MEQ tablet  Commonly known as:  K-DUR,KLOR-CON  Take 2 tablets (40 mEq total) by mouth 2 (two) times daily. Take 2 tabs in AM and 1 tab in PM     pregabalin 75 MG capsule  Commonly known as:  LYRICA  Take 75 mg by mouth 2 (two) times daily.     tiotropium 18 MCG inhalation capsule  Commonly known as:  SPIRIVA HANDIHALER  Place 1 capsule (18 mcg total) into inhaler and inhale daily.     torsemide 20 MG tablet  Commonly known as:  DEMADEX  Take 5 tablets (100 mg total) by mouth 2 (two) times daily.     warfarin 10 MG tablet  Commonly known as:  COUMADIN  Take 10 mg daily until follow up        Disposition   The patient will be discharged in stable condition to home.     Discharge Instructions    Diet - low sodium heart healthy    Complete by:  As directed      Heart Failure patients record your daily weight using the same scale at the same time of day    Complete by:  As directed      Increase activity slowly    Complete by:  As directed           Follow-up Information    Follow up with Dyke Maes, MD On 03/09/2016.   Specialty:  Nephrology   Why:  As previously scheduled   Contact information:   [address] [place] [address] [phone]       Follow up with Tonye Becket, NP On 02/19/2016.   Specialty:  Cardiology   Why:  Heart Failure Clinic at [facility] 11:00 Garage Code [id number]   Contact information:   [address] [place] [address] [phone]         Duration of Discharge Encounter: Greater than 35 minutes   Signed, Naveena Eyman NP-C 02/12/2016, 10:33 AM

## 2016-02-12 NOTE — Progress Notes (Signed)
Patient ID: Frank Davidson, male   DOB: 05/30/1951, 65 y.o.   MRN: [id number]     Advanced Heart Failure Rounding Note  PCP: Dr. Shela Commons. [place] Nephrologist: Dr. Briant Cedar HF: [place]  Subjective:    Echo 02/04/16 LVEF reduced to 10-15% from 25-30% in 08/2015  RHC (3/17): RA mean 16 PA 70/31 mean 47 PCWP mean 25 CI 1.6  [place]wan numbers this morning: RA mean 6 PA 62/32  PCWP mean 23 CI 1.7 Co-ox 66%  Milrinone stopped 3/17 and dobutamine 5 mcg/kg/min begun.  Creatinine a bit up today but still around his baseline.    Over the weekend he was confused and had head CT -- No  acute abnormality. Yesterday coreg was stopped.   Yesterday dobutamine was cut back to 2 mcg, hydralazine was increased to 75 mg and imdur increased to 90 mg daily. Today CO-OX 54%.   Overall feeling great. Denies SOB/PND/Orthopnea.       Objective:   Weight Range: 208 lb 8.9 oz (94.6 kg) Body mass index is 29.92 kg/(m^2).   Vital Signs:   Temp:  [97.4 F (36.3 C)-98.8 F (37.1 C)] 97.4 F (36.3 C) (03/23 0400) Pulse Rate:  [83-110] 108 (03/23 0000) Resp:  [13-26] 14 (03/22 1952) BP: (113-156)/(70-93) 130/70 mmHg (03/23 0400) SpO2:  [96 %-100 %] 100 % (03/23 0400) Weight:  [208 lb 8.9 oz (94.6 kg)] 208 lb 8.9 oz (94.6 kg) (03/23 0404) Last BM Date: 02/11/16  Weight change: Filed Weights   02/10/16 0500 02/11/16 0335 02/12/16 0404  Weight: 209 lb 9.6 oz (95.074 kg) 209 lb 14.1 oz (95.2 kg) 208 lb 8.9 oz (94.6 kg)    Intake/Output:   Intake/Output Summary (Last 24 hours) at 02/12/16 0754 Last data filed at 02/12/16 0600  Gross per 24 hour  Intake 1068.93 ml  Output   1450 ml  Net -381.07 ml     Physical Exam: CVP 2-4  General: Obese, pleasant HEENT: normal  Neck: supple. JVP flat  cm. Carotids 2+ bilat; no bruits. No thyromegaly or nodule noted. Cor: PMI nondisplaced. Irregular.  Lungs: Slightly diminished bases. Abdomen: Obese, soft, NT, mildly distended, no HSM. No bruits or  masses. +BS  Extremities: no cyanosis, clubbing, rash. No edema.  LUE PICC Neuro: alert & orientedx3, cranial nerves grossly intact. moves all 4 extremities w/o difficulty. Affect pleasant  Telemetry: Atrial flutter with 2:1 block in 70-80s  Labs: CBC  Recent Labs  02/12/16 0400  WBC 14.7*  HGB 14.0  HCT 44.1  MCV 85.5  PLT 161   Basic Metabolic Panel  Recent Labs  02/11/16 0347 02/12/16 0400  NA 131* 135  K 4.0 4.6  CL 95* 97*  CO2 28 24  GLUCOSE 119* 112*  BUN 43* 36*  CREATININE 1.96* 1.93*  CALCIUM 9.8 10.1   Liver Function Tests No results for input(s): AST, ALT, ALKPHOS, BILITOT, PROT, ALBUMIN in the last 72 hours. No results for input(s): LIPASE, AMYLASE in the last 72 hours. Cardiac Enzymes No results for input(s): CKTOTAL, CKMB, CKMBINDEX, TROPONINI in the last 72 hours.  BNP: BNP (last 3 results)  Recent Labs  12/11/15 2345 01/13/16 1544 02/03/16 0115  BNP 1024.0* 843.5* 750.3*    ProBNP (last 3 results) No results for input(s): PROBNP in the last 8760 hours.   D-Dimer No results for input(s): DDIMER in the last 72 hours. Hemoglobin A1C No results for input(s): HGBA1C in the last 72 hours. Fasting Lipid Panel No results for input(s): CHOL, HDL, LDLCALC,  TRIG, CHOLHDL, LDLDIRECT in the last 72 hours. Thyroid Function Tests No results for input(s): TSH, T4TOTAL, T3FREE, THYROIDAB in the last 72 hours.  Invalid input(s): FREET3  Other results:     Imaging/Studies:  No results found.  Latest Echo  Latest Cath   Medications:     Scheduled Medications: . allopurinol  100 mg Oral Daily  . budesonide (PULMICORT) nebulizer solution  0.25 mg Nebulization BID  . docusate sodium  100 mg Oral BID  . hydrALAZINE  75 mg Oral TID  . insulin aspart  0-9 Units Subcutaneous TID WC  . isosorbide mononitrate  90 mg Oral Daily  . omega-3 acid ethyl esters  1 g Oral Daily  . potassium chloride SA  40 mEq Oral BID  . sodium chloride flush   10-40 mL Intracatheter Q12H  . sodium chloride flush  10-40 mL Intracatheter Q12H  . torsemide  100 mg Oral BID  . warfarin  15 mg Oral ONCE-1800  . Warfarin - Pharmacist Dosing Inpatient   Does not apply q1800    Infusions: . DOBUTamine 2 mcg/kg/min (02/11/16 0845)  . heparin 1,150 Units/hr (02/11/16 0845)    PRN Medications: acetaminophen **OR** acetaminophen, albuterol, baclofen, calcium carbonate, ondansetron **OR** ondansetron (ZOFRAN) IV, oxyCODONE-acetaminophen **AND** oxyCODONE, sodium chloride flush, sodium chloride flush   Assessment   1. Acute respiratory failure with hypoxia 2. Acute on chronic systolic CHF with cardiogenic shock, now on dobutamine.  3. 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 Chronic Afib 5. HTN 6. DM2 7. OSA: does not use CPAP.  8. Delirium  Plan    He is now on dobutamine at 2 mcg. Todays CO-OX 54%.  Continue  torsemide 100 mg twice a day. Renal function leveled off. Increase hydralazine to 100 tid and Imdur to 120 daily. Keep off Coreg for now given dobutamine use.    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  Long-term, being on dobutamine may be difficult because of his atrial arrhythmias and the dobutamine antagonism that we get from beta blocker use.  However, he did better with dobutamine than milrinone (cardiac output stayed quite low on milrinone).   Continue heparin gtt for now for atrial fibrillation. Started coumadin. INR 1.58 . Pharmacy to educate on coumadin today.   BUN/creatinine around his baseline.  HD would be very difficult if he continues to require inotropes.  Hopefully we can avoid this.  Confusion has resolved, may have been related to low output.   Palliative Care appreciated. Continue full code.    He now is interested in ICD. However, if he requires home inotropes he would not be a good ICD candidate unless we plan LVAD (which would be difficult given renal dysfunction).   [facility] to provide  home dobutamine. This has been set up.   Possible D/C today.    Length of Stay: 9  Amy Clegg NP-C  02/12/2016, 7:54 AM  Advanced Heart Failure Team Pager [phone] (M-F; 7a - 4p)  Please contact [facility] for night-coverage after hours (4p -7a ) and weekends on [email or web address]  Patient seen with NP, agree with the above note.  Co-ox 54% today on dobutamine 2.  CVP 3.  Creatinine stable.  I think we will need to send him home on dobutamine at 2 for close followup, ?slow wean.  He is off beta blocker, no problems with HR in atypical flutter around 80 bpm.  Increase hydralazine to 100 tid and Imdur 120.  Creatinine has leveled off at his baseline.  He is back on home torsemide 100 mg bid.   I think that he can go home today with close followup on current medical regimen, including dobutamine.   Marca Ancona 02/12/2016 10:08 AM

## 2016-02-12 NOTE — Progress Notes (Signed)
ANTICOAGULATION CONSULT NOTE - Follow Up Consult  Pharmacy Consult for heparin/warfarin Indication: atrial fibrillation  Allergies  Allergen Reactions  . Corlanor [Ivabradine] Palpitations and Other (See Comments)    Kidney and heart problems, syncope  . Entresto [Sacubitril-Valsartan]     Syncope  . Januvia [Sitagliptin] Other (See Comments)    "Almost killed me"   . Lunesta [Eszopiclone] Palpitations and Other (See Comments)    Kidney and heart problems, syncope  . Actos [Pioglitazone] Other (See Comments)    Caused blood in urine and fluid retention     Patient Measurements: Height: 5\' 10"  (177.8 cm) Weight: 208 lb 8.9 oz (94.6 kg) IBW/kg (Calculated) : 73 Heparin Dosing Weight: 94 kg  Vital Signs: Temp: 97.4 F (36.3 C) (03/23 0400) Temp Source: Oral (03/23 0400) BP: 130/70 mmHg (03/23 0400) Pulse Rate: 108 (03/23 0000)  Labs:  Recent Labs  02/10/16 0326 02/11/16 0347 02/12/16 0400  HGB  --   --  14.0  HCT  --   --  44.1  PLT  --   --  161  APTT 78* 83* 105*  LABPROT 15.3* 15.5* 18.9*  INR 1.20 1.21 1.58*  HEPARINUNFRC 0.88* 0.60 0.45  CREATININE 2.20* 1.96* 1.93*    Estimated Creatinine Clearance: 44 mL/min (by C-G formula based on Cr of 1.93).  Assessment: 65 yo M admitted 02/02/2016 with SOB and edema. Echo on 3/15 shows reduced LVEF of 10-15% from 25-30% in 08/2015. Patient was on Eliquis PTA for Afib with last dose on 3/17 at 1124. Hep gtt started 12 hrs after last dose of apixaban. Now to bridge to warfarin instead of resuming apixaban due to decline in renal function.   HL 0.45, Hgb 14, Plt 161. No s/sx of bleeding noted.  Goal of Therapy:  Heparin level 0.3-0.7 units/ml  INR 2-3 Monitor platelets by anticoagulation protocol: Yes   Plan:  - Continue heparin at 1150 units/hr - Warfarin 15mg *1 tonight - Monitor daily HL, CBC, INR and s/sx of bleeding   Sherron Monday, PharmD Clinical Pharmacy Resident Pager: [phone] 02/12/2016 7:57  AM

## 2016-02-13 ENCOUNTER — Other Ambulatory Visit: Payer: Self-pay | Admitting: *Deleted

## 2016-02-13 NOTE — Addendum Note (Signed)
Addended by: Abbe Amsterdam C on: 02/13/2016 01:59 PM   Modules accepted: Orders

## 2016-02-13 NOTE — Patient Outreach (Addendum)
[facility]) Care Management  02/13/2016  Frank Davidson 12-31-1950 [id number]   Transition of care (week 1)  Pt discharged on 3/23 and RN attempted the initial outreach call today however unable to leave a message. Will continue attempts to reach this pt for possible intervention o services.  [name], RN Care Management Coordinator [facility] Main Office [phone]

## 2016-02-16 ENCOUNTER — Encounter (HOSPITAL_COMMUNITY): Payer: Commercial Managed Care - HMO | Admitting: Internal Medicine

## 2016-02-17 LAB — PROTIME-INR: INR: 4.9 — AB (ref <=1.1)

## 2016-02-17 LAB — POCT INR: INR: 4.5

## 2016-02-18 ENCOUNTER — Telehealth (HOSPITAL_COMMUNITY): Payer: Self-pay

## 2016-02-18 ENCOUNTER — Telehealth: Payer: Self-pay | Admitting: Family Medicine

## 2016-02-18 ENCOUNTER — Other Ambulatory Visit (HOSPITAL_COMMUNITY): Payer: Self-pay

## 2016-02-18 ENCOUNTER — Ambulatory Visit (INDEPENDENT_AMBULATORY_CARE_PROVIDER_SITE_OTHER): Payer: Commercial Managed Care - HMO | Admitting: Cardiology

## 2016-02-18 DIAGNOSIS — I480 Paroxysmal atrial fibrillation: Secondary | ICD-10-CM

## 2016-02-18 DIAGNOSIS — I639 Cerebral infarction, unspecified: Secondary | ICD-10-CM

## 2016-02-18 DIAGNOSIS — Z7901 Long term (current) use of anticoagulants: Secondary | ICD-10-CM

## 2016-02-18 DIAGNOSIS — Z5181 Encounter for therapeutic drug level monitoring: Secondary | ICD-10-CM

## 2016-02-18 NOTE — Telephone Encounter (Signed)
Pt left VM 3/29 2:46pm to schedule hospital f/u appt - called back and VM full - sent SMS msg to return call

## 2016-02-18 NOTE — Patient Instructions (Signed)

## 2016-02-18 NOTE — Telephone Encounter (Signed)
Donita HH RN with [facility] called to report patients INR at home was 4.5 Sent STAT INR to be processed from lab. Patient states no one actively manages his coumadin, and he assumed CHF clinic was. Our clinic does not monitor/dose coumadin. Referral sent to [facility]. [place] Nurse Secretary of CHF clinic will follow up to make sure appointment is made for patient as soon as possible Advised to hold coumadin today.  Ave Filter

## 2016-02-19 ENCOUNTER — Ambulatory Visit (HOSPITAL_COMMUNITY)
Admit: 2016-02-19 | Discharge: 2016-02-19 | Disposition: A | Discharge status: Home / Self Care | Payer: Commercial Managed Care - HMO | Source: Ambulatory Visit | Attending: Cardiology | Admitting: Cardiology

## 2016-02-19 VITALS — BP 134/62 | HR 76 | Wt 214.6 lb

## 2016-02-19 DIAGNOSIS — G4733 Obstructive sleep apnea (adult) (pediatric): Secondary | ICD-10-CM

## 2016-02-19 DIAGNOSIS — Z7901 Long term (current) use of anticoagulants: Secondary | ICD-10-CM

## 2016-02-19 DIAGNOSIS — I130 Hypertensive heart and chronic kidney disease with heart failure and stage 1 through stage 4 chronic kidney disease, or unspecified chronic kidney disease: Secondary | ICD-10-CM | POA: Diagnosis not present

## 2016-02-19 DIAGNOSIS — J449 Chronic obstructive pulmonary disease, unspecified: Secondary | ICD-10-CM | POA: Insufficient documentation

## 2016-02-19 DIAGNOSIS — I739 Peripheral vascular disease, unspecified: Secondary | ICD-10-CM | POA: Diagnosis not present

## 2016-02-19 DIAGNOSIS — I5023 Acute on chronic systolic (congestive) heart failure: Secondary | ICD-10-CM | POA: Diagnosis present

## 2016-02-19 DIAGNOSIS — I482 Chronic atrial fibrillation: Secondary | ICD-10-CM | POA: Diagnosis not present

## 2016-02-19 DIAGNOSIS — Z8673 Personal history of transient ischemic attack (TIA), and cerebral infarction without residual deficits: Secondary | ICD-10-CM | POA: Diagnosis not present

## 2016-02-19 DIAGNOSIS — Z79899 Other long term (current) drug therapy: Secondary | ICD-10-CM | POA: Diagnosis not present

## 2016-02-19 DIAGNOSIS — Z833 Family history of diabetes mellitus: Secondary | ICD-10-CM | POA: Insufficient documentation

## 2016-02-19 DIAGNOSIS — E1122 Type 2 diabetes mellitus with diabetic chronic kidney disease: Secondary | ICD-10-CM | POA: Insufficient documentation

## 2016-02-19 DIAGNOSIS — E785 Hyperlipidemia, unspecified: Secondary | ICD-10-CM | POA: Insufficient documentation

## 2016-02-19 DIAGNOSIS — I480 Paroxysmal atrial fibrillation: Secondary | ICD-10-CM

## 2016-02-19 DIAGNOSIS — K219 Gastro-esophageal reflux disease without esophagitis: Secondary | ICD-10-CM | POA: Diagnosis not present

## 2016-02-19 DIAGNOSIS — M109 Gout, unspecified: Secondary | ICD-10-CM | POA: Insufficient documentation

## 2016-02-19 DIAGNOSIS — N184 Chronic kidney disease, stage 4 (severe): Secondary | ICD-10-CM | POA: Diagnosis not present

## 2016-02-19 DIAGNOSIS — I428 Other cardiomyopathies: Secondary | ICD-10-CM | POA: Diagnosis not present

## 2016-02-19 DIAGNOSIS — Z87891 Personal history of nicotine dependence: Secondary | ICD-10-CM | POA: Insufficient documentation

## 2016-02-19 DIAGNOSIS — M1990 Unspecified osteoarthritis, unspecified site: Secondary | ICD-10-CM | POA: Diagnosis not present

## 2016-02-19 LAB — COMPREHENSIVE METABOLIC PANEL
ALBUMIN: 4 g/dL (ref 3.5–5.0)
ALT: 26 U/L (ref 17–63)
AST: 55 U/L — AB (ref 15–41)
Alkaline Phosphatase: 69 U/L (ref 38–126)
Anion gap: 14 (ref 5–15)
BILIRUBIN TOTAL: 1.1 mg/dL (ref 0.3–1.2)
BUN: 76 mg/dL — AB (ref 6–20)
CALCIUM: 9.7 mg/dL (ref 8.9–10.3)
CO2: 30 mmol/L (ref 22–32)
Chloride: 87 mmol/L — ABNORMAL LOW (ref 101–111)
Creatinine, Ser: 2.99 mg/dL — ABNORMAL HIGH (ref 0.61–1.24)
GFR calc Af Amer: 24 mL/min — ABNORMAL LOW (ref >60)
GFR calc non Af Amer: 20 mL/min — ABNORMAL LOW (ref >60)
GLUCOSE: 120 mg/dL — AB (ref 65–99)
Potassium: 3.1 mmol/L — ABNORMAL LOW (ref 3.5–5.1)
Sodium: 131 mmol/L — ABNORMAL LOW (ref 135–145)
TOTAL PROTEIN: 7.9 g/dL (ref 6.5–8.1)

## 2016-02-19 LAB — CARBOXYHEMOGLOBIN
Carboxyhemoglobin: 1.4 % (ref 0.5–1.5)
METHEMOGLOBIN: 0.9 % (ref 0.0–1.5)
O2 Saturation: 60 %
Total hemoglobin: 13.3 g/dL — ABNORMAL LOW (ref 13.5–18.0)

## 2016-02-19 LAB — CBC
HCT: 40.1 % (ref 39.0–52.0)
Hemoglobin: 13 g/dL (ref 13.0–17.0)
MCH: 27.1 pg (ref 26.0–34.0)
MCHC: 32.4 g/dL (ref 30.0–36.0)
MCV: 83.7 fL (ref 78.0–100.0)
PLATELETS: 315 10*3/uL (ref 150–400)
RBC: 4.79 MIL/uL (ref 4.22–5.81)
RDW: 17.3 % — AB (ref 11.5–15.5)
WBC: 6.9 10*3/uL (ref 4.0–10.5)

## 2016-02-19 LAB — BRAIN NATRIURETIC PEPTIDE: B Natriuretic Peptide: 400.3 pg/mL — ABNORMAL HIGH (ref 0.0–100.0)

## 2016-02-19 LAB — PROTIME-INR
INR: 2.52 — ABNORMAL HIGH (ref 0.00–1.49)
Prothrombin Time: 26.9 seconds — ABNORMAL HIGH (ref 11.6–15.2)

## 2016-02-19 LAB — MAGNESIUM: MAGNESIUM: 2.3 mg/dL (ref 1.7–2.4)

## 2016-02-19 NOTE — Patient Instructions (Addendum)
[facility] to begin Physical Therapy in the home  Please take a metolazone today and HOLD your scheduled dose for Friday 02/20/16, next week return to your routine schedule on Tuesday and Friday  Your physician recommends that you schedule a follow-up appointment in: 2 weeks  Do the following things EVERYDAY: 1) Weigh yourself in the morning before breakfast. Write it down and keep it in a log. 2) Take your medicines as prescribed 3) Eat low salt foods-Limit salt (sodium) to 2000 mg per day.  4) Stay as active as you can everyday 5) Limit all fluids for the day to less than 2 liters 6)

## 2016-02-19 NOTE — Progress Notes (Signed)
Patient ID: Frank Davidson, male   DOB: January 14, 1951, 65 y.o.   MRN: [id number] Patient ID: Frank Davidson, male   DOB: [birth year], 65 y.o.   MRN: [id number]    [facility] NOTE  PCP: Dr. Shela Commons Copland Nephrologist: Dr. Briant Cedar HF: [place]  HPI: [name] is a 65 y.o. male with a hx of obesity, PAF, HTN, COPD, HTN, gout, DM 2, CVA and CHF EF 25% due to NICM. Intolerant Entresto at the lowest dose due to syncope.   Has previously followed in the [facility] in [place]. Previous cath in 2007 showed NICM with normal cors. Was living alone in [place] and apparently had issues managing medications by himself and was getting confused with PCP and cardiologists medications and was not taking his meds correctly. He moved to [place] to be near his nieces. Issues in the past with ACE-I due to renal failure. Highest creatinine in our system is ~1.5 but was apparently higher previously - thought to be related to taking his medications incorrectly.   Was admitted in 06/2013 from [facility] with left sided weakness and was found to have a moderate to large right hemispheric infarct. Went back to [employer] [facility].   Admitted to [facility] 10/14 through 09/09/13 with ADHF. Diuresed 30 pounds with IV lasix and transitioned to lasix 80 mg po bid. Continued on 25 mg carvedilol bid, isordil 20 mg daily.  Started on lisinopril 2.5 mg daily. Discharge weight 229 pounds. 09/29/13 CT abdomen and pelvis - no obstruction . Diverticulosis. No inflammatory changes.   Admitted 11/23/13 through 11/26/13 with volume overload and dyspnea. Had worsening cardiorenal syndrome. Nephrology consulted and managed with IV lasix and transitioned to torsemide 40 mg twice a day.  Diuresed 25 pounds. Discharge weight 232 pounds. Hydralazine, Spiro, and Lisinopril stopped at discharge. He is also off digoxin.    Admitted 07/01/15 for AKI after presumed over diuresis. His Cr on admit was 4.6., though his baseline is 2.  We  followed him in the hospital and held diuresis for several days.  Cr on discharge was 2.36. We decreased his spiro to 12.5 mg, and his diuretic regimen was continued at 40 mg torsemide BID.  Discharge weight was 229.  Admitted in 10/16 with acute on chronic systolic CHF and AKI with creatinine up to 7.1 => ATN.  Suspected cardiorenal syndrome, but surprisingly co-ox was normal.  CVVH was required but renal function gradually improved. D/c weight 212 pounds.  Admitted March 13 through March 23 rd with marked volume overload and cardiogenic shock. He failed dobutamine wean.  Due to worsening renal function nephrology was consulted. He did not require HD. Placed coumadin in the event he needed HD. Palliative Care consulted and he requested full code blue. Discharge weight 209 pounds.    He returns for post-hospital  follow-up.  Overall he feels terrible. Says within  24 hours of discharge he was SOB with exertion. Having difficulty walking in his house. SOB at rest. + Orthopnea. SOB talking. He has to rest breaks performing ADLs. Weight at home trending up from 210-212 pounds. Taking all medications. Followed by [facility] for home milrinone. Lives at home with his sister.    PFTs (9/16): Severe obstruction.    ECHO 11/08/13 EF 15%  ECHO 07/05/14: EF 15%  ECHO 11/26/2014: EF 20%  ECHO 10/16: EF 25-30%, mild LV dilation, diffuse hypokinesis, mild MR, mild RV dilation  RHC 02/06/2016 RA = 16 RV = 71/8/14 PA = 70/31 (47) PCW = 25  Fick cardiac output/index = 3.7/1.6 Thermo CO/CI = 3.4/1.5 PVR = 6.4 WU Ao sat = 95% PA sat = 38%, 46%  08/13/14 RA = 7  RV = 50/3/10  PA = 54/22 (34)  PCW = 16  Fick cardiac output/index = 6.9/3.0  Thermo CO/CI = 4.7/2.1  PVR = 4.1 WU  Ao sat = 94%  PA sat = 68%,68%  SVC sat (sheath) = 62%  Labs 08/22/14 K 4.4 creatinine 1.6  09/02/14 Creatinine 1.6 10/20/14: creatinine 2.58, K 5.1 11/23/2014: Creatinine 4.12 --Ace and Spiro stopped 11/24/2014: Creatinine  3.55 1/4/12016: 2.49  11/26/2014: Hgb 10.4 Hct 31.7 Creatinine 1.94  12/03/14: K 4, creatinine 2.25 02/19/2015: K 3.8 Creatinine 2.66  05/05/2015: K 3.4 Creatinine 2.68   8/16: K 3.5 Creatinine 2.36 => 2.84, hgb 10.4, BNP 523 10/16: K 3.7, creatinine 2.57 => 2.6 => 2.8 09/12/15: K 3.7, creatinine 2.82, BUN 79 09/15/15: K 3.8, creatinine 2.84, BUN 77 12/03/15: K 3.5, creatinine 2.56, BUN 39 02/12/2016: K 4.6 Creatinine 1.93   SH: Retired Engineer, 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  Past Medical History  Diagnosis Date  . Atrial fibrillation (HCC)     PAF  . Hypertension   . COPD (chronic obstructive pulmonary disease) (HCC)   . Ocular herpes   . Gout   . CHF (congestive heart failure) (HCC)   . Chronic pain   . Hyperlipidemia   . Nonischemic cardiomyopathy (HCC) Aug 2015    EF 15%  . Ocular herpes   . Neuropathy (HCC)   . Left-sided weakness     "because of the arthritis and edema"  . PVD (peripheral vascular disease) [facility]) May 2015    abnormal ABIs  . Memory impairment     "short term"  . CKD (chronic kidney disease), stage III     Dr. Briant Cedar follows-holding   . Fibromyalgia     neuropathy- hands, more than feet.  . Sleep apnea with use of continuous positive airway pressure (CPAP)     does not use cpap  . GERD (gastroesophageal reflux disease)     tums as needed  . Asthma   . Type II diabetes mellitus (HCC)   . Stroke [facility]) X 5    last stroke aug 2014"; denies residual on 09/12/2014  . Osteoarthritis   . Arthritis     "knees, right elbow, shoulder" (09/12/2014)  . Shortness of breath dyspnea     Current Outpatient Prescriptions  Medication Sig Dispense Refill  . albuterol (PROVENTIL HFA;VENTOLIN HFA) 108 (90 BASE) MCG/ACT inhaler Inhale 2 puffs into the lungs every 6 (six) hours as needed for wheezing or shortness of breath. 1 Inhaler 1  . albuterol  (PROVENTIL) (2.5 MG/3ML) 0.083% nebulizer solution Take 3 mLs (2.5 mg total) by nebulization every 4 (four) hours as needed for wheezing or shortness of breath. 150 vial 5  . allopurinol (ZYLOPRIM) 100 MG tablet TAKE 1 TABLET (100 MG TOTAL) BY MOUTH DAILY. 90 tablet 1  . baclofen (LIORESAL) 10 MG tablet TAKE 1 TABLET EVERY 8 HOURS AS NEEDED FOR MUSCLE SPASM(S) 90 tablet 4  . diclofenac (FLECTOR) 1.3 % PTCH Place 1 patch onto the skin 2 (two) times daily as needed (pain). 180 patch 3  . DOBUTamine (DOBUTREX) 4-5 MG/ML-% infusion Inject 190.4 mcg/min into the vein continuous. From [facility] pharmacy x 12 months 250 mL 12  .  docusate sodium 100 MG CAPS Take 100 mg by mouth 2 (two) times daily. 10 capsule 0  . hydrALAZINE (APRESOLINE) 100 MG tablet Take 1 tablet (100 mg total) by mouth 3 (three) times daily. 90 tablet 6  . ipratropium-albuterol (DUONEB) 0.5-2.5 (3) MG/3ML SOLN Take 3 mLs by nebulization 4 (four) times daily. 360 mL 11  . isosorbide mononitrate (IMDUR) 120 MG 24 hr tablet Take 1 tablet (120 mg total) by mouth daily. 30 tablet 6  . metolazone (ZAROXOLYN) 2.5 MG tablet Take 1 tablet (2.5 mg total) by mouth as needed. Tuesday and Friday 15 tablet 3  . Multiple Vitamins-Minerals (MULTIVITAMIN WITH MINERALS) tablet Take 1 tablet by mouth daily.    Marland Kitchen omega-3 acid ethyl esters (LOVAZA) 1 G capsule Take 1 g by mouth daily.    Marland Kitchen oxyCODONE-acetaminophen (PERCOCET) 10-325 MG tablet Take 1 tablet by mouth every 8 (eight) hours as needed for pain. Use with stool softerner. 45 tablet 0  . polyethylene glycol powder (GLYCOLAX/MIRALAX) powder MIX 1 CAPFUL (17GM)  IN  LIQUID  AND  DRINK EVERY DAY AS DIRECTED 1530 g 3  . potassium chloride SA (K-DUR,KLOR-CON) 20 MEQ tablet Take 2 tablets (40 mEq total) by mouth 2 (two) times daily. Take 2 tabs in AM and 1 tab in PM 120 tablet 5  . pregabalin (LYRICA) 75 MG capsule Take 75 mg by mouth 2 (two) times daily.    Marland Kitchen tiotropium (SPIRIVA HANDIHALER) 18 MCG inhalation  capsule Place 1 capsule (18 mcg total) into inhaler and inhale daily. 90 capsule 2  . torsemide (DEMADEX) 20 MG tablet Take 5 tablets (100 mg total) by mouth 2 (two) times daily. 720 tablet 3  . warfarin (COUMADIN) 10 MG tablet Take 10 mg daily until follow up 45 tablet 6   No current facility-administered medications for this encounter.    Filed Vitals:   02/19/16 1019  BP: 134/62  Pulse: 76  Weight: 214 lb 9.6 oz (97.342 kg)  SpO2: 95%     Wt Readings from Last 3 Encounters:  02/12/16 208 lb 8.9 oz (94.6 kg)  01/15/16 219 lb 8 oz (99.565 kg)  12/23/15 220 lb 6.4 oz (99.973 kg)    PHYSICAL EXAM: General:  Obese male; no resp difficutly HEENT: normal Neck: supple. Thick, JVP difficult to assess, but appears elevated. Carotids 2+ bilaterally; no bruits. No thyromegaly or nodule noted. Cor: PMI nonpalpable. Irregular. No M/G/R appreciated Lungs: Clear, regular with normal respiratory effort Abdomen: obese. soft, nontender, + distention. No HSM. No bruits or masses. Bowel sounds x 4. Extremities: no cyanosis, clubbing, rash. no edema.  Neuro: alert & orientedx3, cranial nerves grossly intact. Affect pleasant.  EKG: Afib 78 bpm.    ASSESSMENT & PLAN:  1) Acute/Chronic Systolic Heart Failure: Nonischemic cardiomyopathy, EF 10-15% in  01/2016  NYHA IV.  He refuses consideration for ICD. Recent admit with cardiogenic shock. Discharge on dobutamine  2 mcg per hour. NYHA III-IV. Dyspneic in part because he did not take his diuretics today. Check CO-OX now. If CO-OX less than 55% will increase to 3 mcg.   Continue torsemide 100 mg BID. Today I asked him to take metolazone instead of tomorrow then restart metolazone 2.5 mg every Tuesdays and Fridays.  Check CMET now.   -He is not on BB due to low output heart failrue.  - Continue Imdur 120 mg mg daily and continue hydalazine 100 mg tid - Off digoxin, losartan, and spiro due to CKD.   - Instructed to  continue daily weights, fluid intake <  2L daily, and to keep Na intake to < 2000 mg daily.  - Knows to call the HF clinic if weight increases more than 3 lbs overnight or 5 lbs in a week.  - Also followed by Paramedicine. Continue [facility].  2) Gout: Continue allopurinol. Follow with rheumatology.  3) Atrial fibrillation: Chronic. Rate controlled. Continue coumadin. He has been referred to coumadin clinic. INR results sent to [facility] Clinic.  Marland Kitchen4) CKD stage IV:  -- Evaluated by Nephrolgy Saw Dr Briant Cedar at end of November. Kidneys stable but would like him to get a shunt placed in anticipation of dialysis.  5) COPD:  - Severe obstruction on PFTs.   - Prior smoker. -On [phone] and spiriva with albuterol prn. 6) Suspected OSA: Sleep study with AHI of 60 but does not have CPAP yet. Encouraged him to f/u with Dr. Theresia Bough   By the end of the visit he was not SOB at rest. I think this will be better once he takes his medications. he was instructed to take meds as soon as he gets home. Check CBC, CMET, BNP, CO-OX now. Ask [facility] to add PT. May need oxygen if O2 sats at home are < 88%.  Follow up in 2 weeks.    Amy Clegg, NP-C  10:16 AM

## 2016-02-20 ENCOUNTER — Other Ambulatory Visit: Payer: Self-pay | Admitting: *Deleted

## 2016-02-20 ENCOUNTER — Ambulatory Visit (INDEPENDENT_AMBULATORY_CARE_PROVIDER_SITE_OTHER): Payer: Commercial Managed Care - HMO

## 2016-02-20 ENCOUNTER — Other Ambulatory Visit (HOSPITAL_COMMUNITY): Payer: Self-pay | Admitting: Internal Medicine

## 2016-02-20 DIAGNOSIS — Z5181 Encounter for therapeutic drug level monitoring: Secondary | ICD-10-CM

## 2016-02-20 DIAGNOSIS — I480 Paroxysmal atrial fibrillation: Secondary | ICD-10-CM

## 2016-02-20 DIAGNOSIS — Z7901 Long term (current) use of anticoagulants: Secondary | ICD-10-CM

## 2016-02-20 DIAGNOSIS — I639 Cerebral infarction, unspecified: Secondary | ICD-10-CM

## 2016-02-20 LAB — POCT INR: INR: 2.5

## 2016-02-20 NOTE — Patient Outreach (Signed)
[facility]) Care Management  02/20/2016  Frank Davidson 10-Jun-1951 [id number]  Transition of care (week 2)  RN attempted outreach call today however remains unsuccessful as pt's mail box is full and unable to leave a message. RN will continue protocol with another outreach call next week.  [name], RN Care Management Coordinator [facility] Main Office [phone]

## 2016-02-20 NOTE — Addendum Note (Signed)
Encounter addended by: Theresia Bough, CMA on: 02/20/2016  2:47 PM<BR>     Documentation filed: Visit Diagnoses, Orders, Dx Association

## 2016-02-23 ENCOUNTER — Encounter: Payer: Self-pay | Admitting: Family Medicine

## 2016-02-23 ENCOUNTER — Other Ambulatory Visit: Payer: Self-pay

## 2016-02-23 ENCOUNTER — Ambulatory Visit (INDEPENDENT_AMBULATORY_CARE_PROVIDER_SITE_OTHER): Payer: Commercial Managed Care - HMO | Admitting: Family Medicine

## 2016-02-23 ENCOUNTER — Other Ambulatory Visit: Payer: Self-pay | Admitting: *Deleted

## 2016-02-23 ENCOUNTER — Telehealth (HOSPITAL_COMMUNITY): Payer: Self-pay | Admitting: Student

## 2016-02-23 VITALS — BP 118/80 | HR 80 | Temp 97.5°F | Ht 70.0 in | Wt 210.8 lb

## 2016-02-23 DIAGNOSIS — E1169 Type 2 diabetes mellitus with other specified complication: Secondary | ICD-10-CM

## 2016-02-23 DIAGNOSIS — I482 Chronic atrial fibrillation, unspecified: Secondary | ICD-10-CM

## 2016-02-23 DIAGNOSIS — R69 Illness, unspecified: Secondary | ICD-10-CM | POA: Diagnosis not present

## 2016-02-23 DIAGNOSIS — G894 Chronic pain syndrome: Secondary | ICD-10-CM

## 2016-02-23 DIAGNOSIS — I5022 Chronic systolic (congestive) heart failure: Secondary | ICD-10-CM | POA: Diagnosis not present

## 2016-02-23 DIAGNOSIS — T502X5S Adverse effect of carbonic-anhydrase inhibitors, benzothiadiazides and other diuretics, sequela: Secondary | ICD-10-CM

## 2016-02-23 MED ORDER — OXYCODONE-ACETAMINOPHEN 10-325 MG PO TABS: 1.0000 | ORAL_TABLET | Freq: Three times a day (TID) | ORAL | Status: DC | PRN

## 2016-02-23 MED ORDER — POTASSIUM CHLORIDE CRYS ER 20 MEQ PO TBCR: 40.0000 meq | EXTENDED_RELEASE_TABLET | Freq: Two times a day (BID) | ORAL | Status: DC

## 2016-02-23 MED ORDER — OXYCODONE-ACETAMINOPHEN 10-325 MG PO TABS: 1.0000 | ORAL_TABLET | Freq: Four times a day (QID) | ORAL | Status: DC | PRN

## 2016-02-23 NOTE — Patient Instructions (Signed)
It was good to see you today For the time being we will not start you on any medication for your diabetes.    I will update your "code status" to "do not resuscitate" today. This means that if your heart were to stop or if you stopped breathing you would not want chest compressions, a breathing tube, shocks to the heart or special drugs to restart your heart. Please share this decision with your family. I will also update your heart team and let them know that you may want to stop the dobutamine.

## 2016-02-23 NOTE — Progress Notes (Signed)
[facility] [address] [place], [place] [address] [phone]  Date:  02/23/2016   Name:  Frank Davidson   DOB:  Aug 31, 1951   MRN:  [id number]  PCP:  Abbe Amsterdam, MD    Chief Complaint: Hospitalization Follow-up   History of Present Illness:  Frank Davidson is a 65 y.o. very pleasant male patient who presents with the following:  Gentleman with history of severe HF and also CRF here today to follow-up from a recent hospital stay.  He was admitted from 3/13 to 3/23 with acute respiratory failure, acute on chronic CHF with cardiogenic shock (now on dobutamine infusion), chronic a fib, HTN, DM  He notes that he is feeling very tired, he feels as thought his quality of life has gone down recently. He does not like being on the dobutamine infusion.   His glipizide was stopped and he was not sure if he needs a new DM medication  Lab Results  Component Value Date   HGBA1C 5.7* 12/12/2015   Discussed his code status with him again. Palliative care did visit him during his recent hospital stay and at that time he wished to remain a full code. However at this time he stats that he feels awful. He has thought about things and just wishes to enjoy the time he has left. He would like for his care to focus more on comfort and wishes to become a DNR  INR was 2.52 on 3/31 He is now on coumadin but would like to go back on eliquis to reduce lab draws and avoid dietary restriction  He is taking percocet 4x a day- #120 should last a month a month. He reports being stable on this dose of 3-4 years. Zymarion reports that he is on this medication for "fibromyalgia and just pain in my knees and hips."  He had been in pain management but lost this when he went on [employer].  At this point I will just manage his pain instead of having him see another doctor.  NCCSR shows that he has been receiving #60 percoet approx every 2 weeks for the  last several months   Wt Readings from Last 3 Encounters:  02/23/16 210 lb 12.8 oz (95.618 kg)  02/19/16 214 lb 9.6 oz (97.342 kg)  02/12/16 208 lb 8.9 oz (94.6 kg)     Patient Active Problem List   Diagnosis Date Noted  . Anticoagulated on Coumadin 02/18/2016  . COPD exacerbation (HCC) 02/03/2016  . Acute respiratory failure with hypoxia (HCC) 02/03/2016  . Renal failure (ARF), acute on chronic (HCC) 02/03/2016  . Elevated troponin 01/13/2016  . Acute on chronic systolic heart failure (HCC) 01/13/2016  . Morbid obesity due to excess calories (HCC) 10/06/2015  . Snoring 10/06/2015  . Hypersomnia with sleep apnea 10/06/2015  . CHF (congestive heart failure), NYHA class I (HCC) 10/06/2015  . Chronic pain syndrome 09/29/2015  . CKD (chronic kidney disease), stage IV (HCC) 09/15/2015  . Type 2 diabetes mellitus with stage 4 chronic kidney disease, without long-term current use of insulin (HCC)   . Acute on chronic systolic CHF (congestive heart failure) (HCC)   . AKI (acute kidney injury) (HCC)   . Acute kidney injury (HCC) 08/27/2015  . Arterial hypotension   . Hip pain 07/23/2015  . Dyspnea   . Acute renal failure superimposed on stage 3 chronic kidney disease (HCC) 07/01/2015  . Herpes ocular   .  Facial rash 06/19/2015  . Essential hypertension 06/19/2015  . Chronic anemia 06/19/2015  . Non-ischemic cardiomyopathy- EF 15% echo Aug 2015 11/24/2014  . Chronic anticoagulation 11/24/2014  . S/P left THA, AA 09/12/2014  . Gout 04/29/2014  . Ocular herpes   . Neuropathy (HCC)   . OSA (obstructive sleep apnea) 07/09/2013  . 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 PAF (paroxysmal atrial fibrillation) (HCC) 07/03/2013  . Hypertension   . COPD (chronic obstructive pulmonary disease) (HCC)   . CKD (chronic kidney disease), stage III   . Polysubstance abuse     Past Medical History  Diagnosis Date  . Atrial fibrillation (HCC)     PAF  . Hypertension   . COPD (chronic  obstructive pulmonary disease) (HCC)   . Ocular herpes   . Gout   . CHF (congestive heart failure) (HCC)   . Chronic pain   . Hyperlipidemia   . Nonischemic cardiomyopathy (HCC) Aug 2015    EF 15%  . Ocular herpes   . Neuropathy (HCC)   . Left-sided weakness     "because of the arthritis and edema"  . PVD (peripheral vascular disease) [facility]) May 2015    abnormal ABIs  . Memory impairment     "short term"  . CKD (chronic kidney disease), stage III     Dr. Briant Cedar follows-holding   . Fibromyalgia     neuropathy- hands, more than feet.  . Sleep apnea with use of continuous positive airway pressure (CPAP)     does not use cpap  . GERD (gastroesophageal reflux disease)     tums as needed  . Asthma   . Type II diabetes mellitus (HCC)   . Stroke [facility]) X 5    last stroke aug 2014"; denies residual on 09/12/2014  . Osteoarthritis   . Arthritis     "knees, right elbow, shoulder" (09/12/2014)  . Shortness of breath dyspnea     Past Surgical History  Procedure Laterality Date  . 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 Tonsillectomy  age 80  . Esophagogastroduodenoscopy N/A 12/24/2013    Procedure: ESOPHAGOGASTRODUODENOSCOPY (EGD);  Surgeon: Louis Meckel, MD;  Location: [facility];  Service: Endoscopy;  Laterality: N/A;  . Colonoscopy N/A 12/24/2013    Procedure: COLONOSCOPY;  Surgeon: Louis Meckel, MD;  Location: [facility] ENDOSCOPY;  Service: Endoscopy;  Laterality: N/A;  . Joint replacement    . Cardiac catheterization  10/2006    normal coronary arteries;   Marland Kitchen Cataract extraction w/ intraocular lens  implant, bilateral Bilateral ~ 2008  . Total hip arthroplasty Left 09/12/2014    Procedure: LEFT TOTAL HIP ARTHROPLASTY ANTERIOR APPROACH;  Surgeon: [name], MD;  Location: [facility] OR;  Service: Orthopedics;  Laterality: Left;  . Right heart catheterization N/A 08/13/2014    Rt ht cath prior to hip surgery  . Cardiac catheterization N/A 02/06/2016    Procedure: Right Heart Cath;   Surgeon: Dolores Patty, MD;  Location: [facility] INVASIVE CV LAB;  Service: Cardiovascular;  Laterality: N/A;    Social History  Substance Use Topics  . Smoking status: Former Smoker -- 0.50 packs/day for 13 years    Types: Cigarettes    Quit date: 12/18/2006  . Smokeless tobacco: Never Used  . Alcohol Use: No     Comment: Quit 2008    Family History  Problem Relation Age of Onset  . Cancer Mother   . Hypertension Mother   . Lung  disease Mother   . Diabetes Father   . Diabetes Sister   . Hypertension Sister   . Hypertension Brother   . Hypertension Sister   . Colon cancer Neg Hx     Allergies  Allergen Reactions  . Corlanor [Ivabradine] Palpitations and Other (See Comments)    Kidney and heart problems, syncope  . Entresto [Sacubitril-Valsartan]     Syncope  . Januvia [Sitagliptin] Other (See Comments)    "Almost killed me"   . Lunesta [Eszopiclone] Palpitations and Other (See Comments)    Kidney and heart problems, syncope  . Actos [Pioglitazone] Other (See Comments)    Caused blood in urine and fluid retention     Medication list has been reviewed and updated.  Current Outpatient Prescriptions on File Prior to Visit  Medication Sig Dispense Refill  . albuterol (PROVENTIL HFA;VENTOLIN HFA) 108 (90 BASE) MCG/ACT inhaler Inhale 2 puffs into the lungs every 6 (six) hours as needed for wheezing or shortness of breath. 1 Inhaler 1  . albuterol (PROVENTIL) (2.5 MG/3ML) 0.083% nebulizer solution Take 3 mLs (2.5 mg total) by nebulization every 4 (four) hours as needed for wheezing or shortness of breath. 150 vial 5  . allopurinol (ZYLOPRIM) 100 MG tablet TAKE 1 TABLET (100 MG TOTAL) BY MOUTH DAILY. 90 tablet 1  . baclofen (LIORESAL) 10 MG tablet TAKE 1 TABLET EVERY 8 HOURS AS NEEDED FOR MUSCLE SPASM(S) 90 tablet 4  . diclofenac (FLECTOR) 1.3 % PTCH Place 1 patch onto the skin 2 (two) times daily as needed (pain). 180 patch 3  . DOBUTamine (DOBUTREX) 4-5 MG/ML-% infusion Inject  190.4 mcg/min into the vein continuous. From [facility] pharmacy x 12 months 250 mL 12  . docusate sodium 100 MG CAPS Take 100 mg by mouth 2 (two) times daily. 10 capsule 0  . hydrALAZINE (APRESOLINE) 100 MG tablet Take 1 tablet (100 mg total) by mouth 3 (three) times daily. 90 tablet 6  . ipratropium-albuterol (DUONEB) 0.5-2.5 (3) MG/3ML SOLN Take 3 mLs by nebulization 4 (four) times daily. 360 mL 11  . isosorbide mononitrate (IMDUR) 120 MG 24 hr tablet Take 1 tablet (120 mg total) by mouth daily. 30 tablet 6  . metolazone (ZAROXOLYN) 2.5 MG tablet Take 1 tablet (2.5 mg total) by mouth as needed. Tuesday and Friday 15 tablet 3  . Multiple Vitamins-Minerals (MULTIVITAMIN WITH MINERALS) tablet Take 1 tablet by mouth daily.    Marland Kitchen omega-3 acid ethyl esters (LOVAZA) 1 G capsule Take 1 g by mouth daily.    . polyethylene glycol powder (GLYCOLAX/MIRALAX) powder MIX 1 CAPFUL (17GM)  IN  LIQUID  AND  DRINK EVERY DAY AS DIRECTED 1530 g 3  . potassium chloride SA (K-DUR,KLOR-CON) 20 MEQ tablet Take 2 tablets (40 mEq total) by mouth 2 (two) times daily. Take 2 tabs in AM and 1 tab in PM (Patient taking differently: Take 2 tabs in AM and 1 tab in PM) 120 tablet 5  . tiotropium (SPIRIVA HANDIHALER) 18 MCG inhalation capsule Place 1 capsule (18 mcg total) into inhaler and inhale daily. 90 capsule 2  . torsemide (DEMADEX) 20 MG tablet Take 5 tablets (100 mg total) by mouth 2 (two) times daily. 720 tablet 3  . warfarin (COUMADIN) 10 MG tablet Take 10 mg daily until follow up 45 tablet 6  . oxyCODONE-acetaminophen (PERCOCET) 10-325 MG tablet Take 1 tablet by mouth every 8 (eight) hours as needed for pain. Use with stool softerner. 45 tablet 0  . pregabalin (LYRICA) 75 MG  capsule Take 75 mg by mouth 2 (two) times daily.     No current facility-administered medications on file prior to visit.    Review of Systems:  As per HPI- otherwise negative.   Physical Examination: Filed Vitals:   02/23/16 1125  BP: 118/80   Pulse: 115  Temp: 97.5 F (36.4 C)   Filed Vitals:   02/23/16 1125  Height: 5\' 10"  (1.778 m)  Weight: 210 lb 12.8 oz (95.618 kg)   Body mass index is 30.25 kg/(m^2). Ideal Body Weight: Weight in (lb) to have BMI = 25: 173.9  GEN: WDWN, NAD, Non-toxic, A & O x 3, looks quite well today. Obese, wearing dobutamine infusion HEENT: Atraumatic, Normocephalic. Neck supple. No masses, No LAD. Ears and Nose: No external deformity. CV: atrial fib, rate 80- 90.  Not tachycardic.  No M/G/R. No JVD. No thrill. No extra heart sounds. PULM: CTA B, no wheezes, crackles, rhonchi. No retractions. No resp. distress. No accessory muscle use.Marland Kitchen EXTR: No c/c/e NEURO Normal gait.  PSYCH: Normally interactive. Conversant. Not depressed or anxious appearing.  Calm demeanor.    Assessment and Plan: Chronic pain syndrome - Plan: oxyCODONE-acetaminophen (PERCOCET) 10-325 MG tablet, DISCONTINUED: oxyCODONE-acetaminophen (PERCOCET) 10-325 MG tablet  Terminal illness  Chronic systolic congestive heart failure (HCC)  Chronic atrial fibrillation (HCC)  Controlled type 2 diabetes mellitus with other specified complication, without long-term current use of insulin (HCC)   Here today to recheck after a hospital stay Pt did not receive the rx for percocet #45- this was destroyed and I gave him an rx for #120 to last for a month. I will manage his chronic pain for now Signed DNR today, gave pt original and will scan into chart Atrial fib with rate control Mr. Bauernfeind is unfortunately very ill and is coming to terms with his limited life expectancy.  He would like to focus more on quality of life at this point Will not start him back on any diabetes medication at this point. He will check his glucose at home on occasion and let me know if over 300 Will contact his heart failure team and communicate his wishes to stop dobutamine and to change back to eliquis  Signed Abbe Amsterdam, MD

## 2016-02-23 NOTE — Progress Notes (Signed)
Pre visit review using our clinic tool,if applicable. No additional management support is needed unless otherwise documented below in the visit note.  

## 2016-02-23 NOTE — Telephone Encounter (Signed)
Potassium 2.6.   Currently taking K 40 meq q am and 20 meq q pm.    Instructed to take K 40 meq BID from now on, with an extra 40 meq today.    Has [facility] visit scheduled for tomorrow.  Will have BMET drawn.   Casimiro Needle [address]" [place], PA-C 02/23/2016 4:02 PM

## 2016-02-23 NOTE — Patient Outreach (Signed)
[facility]) Care Management  02/23/2016  Frank Davidson August 24, 1951 [id number]  Transition of care  RN attempted another outreach call to the number provided via [employer] [phone] H). RN spoke with a family member and requested pt however pt not available.  RN inquired on a previous number RN was told in the past to call for this pt as family member indicated that number is no longer a valid number and pt has obtained a new cell number. Family requested a call back name and number for pt. Information was left to family for pt to relay the request for a call back. Will inquired further on pt's recent discharge from the hospital and progress on his recovery.  Will await call back of inquires.  [name], RN Care Management Coordinator [facility] Main Office [phone]

## 2016-02-24 ENCOUNTER — Ambulatory Visit (INDEPENDENT_AMBULATORY_CARE_PROVIDER_SITE_OTHER): Payer: Commercial Managed Care - HMO | Admitting: Cardiovascular Disease

## 2016-02-24 DIAGNOSIS — Z7901 Long term (current) use of anticoagulants: Secondary | ICD-10-CM

## 2016-02-24 DIAGNOSIS — I639 Cerebral infarction, unspecified: Secondary | ICD-10-CM

## 2016-02-24 DIAGNOSIS — I480 Paroxysmal atrial fibrillation: Secondary | ICD-10-CM

## 2016-02-24 DIAGNOSIS — Z5181 Encounter for therapeutic drug level monitoring: Secondary | ICD-10-CM

## 2016-02-24 LAB — PROTIME-INR: INR: 5.2 — AB (ref <=1.1)

## 2016-02-24 LAB — POCT INR: INR: 4.9

## 2016-02-25 ENCOUNTER — Telehealth (HOSPITAL_COMMUNITY): Payer: Self-pay | Admitting: *Deleted

## 2016-02-25 NOTE — Telephone Encounter (Signed)
Florentina Addison is out seeing pt and called to report wt is up 2 lbs, he is at 212 lb today, he usually runs 210 lb and that is what he was last week and yesterday.  She reports is breathing is at baseline, he has been watching salt and fluid intake and has not missed any meds.  He takes metolazone twice a week and took it yesterday.  Discussed w/Andy Lanna Poche, PA he states to monitor for now no changes.  Florentina Addison is aware and will advise pt to call us in the AM if wt is not down.

## 2016-02-27 ENCOUNTER — Other Ambulatory Visit: Payer: Self-pay | Admitting: *Deleted

## 2016-02-27 ENCOUNTER — Emergency Department (HOSPITAL_COMMUNITY): Payer: Commercial Managed Care - HMO

## 2016-02-27 ENCOUNTER — Inpatient Hospital Stay (HOSPITAL_COMMUNITY)
Admission: EM | Admit: 2016-02-27 | Discharge: 2016-03-02 | DRG: 291 | Disposition: A | Discharge status: Hospice | Payer: Commercial Managed Care - HMO | Attending: Internal Medicine | Admitting: Internal Medicine

## 2016-02-27 ENCOUNTER — Other Ambulatory Visit (HOSPITAL_COMMUNITY): Payer: Self-pay | Admitting: Internal Medicine

## 2016-02-27 ENCOUNTER — Encounter (HOSPITAL_COMMUNITY): Payer: Self-pay

## 2016-02-27 DIAGNOSIS — Z96642 Presence of left artificial hip joint: Secondary | ICD-10-CM | POA: Diagnosis present

## 2016-02-27 DIAGNOSIS — G4733 Obstructive sleep apnea (adult) (pediatric): Secondary | ICD-10-CM | POA: Diagnosis present

## 2016-02-27 DIAGNOSIS — J9601 Acute respiratory failure with hypoxia: Secondary | ICD-10-CM | POA: Diagnosis present

## 2016-02-27 DIAGNOSIS — Z79899 Other long term (current) drug therapy: Secondary | ICD-10-CM

## 2016-02-27 DIAGNOSIS — F419 Anxiety disorder, unspecified: Secondary | ICD-10-CM | POA: Diagnosis present

## 2016-02-27 DIAGNOSIS — Z8673 Personal history of transient ischemic attack (TIA), and cerebral infarction without residual deficits: Secondary | ICD-10-CM | POA: Diagnosis not present

## 2016-02-27 DIAGNOSIS — R0602 Shortness of breath: Secondary | ICD-10-CM | POA: Diagnosis not present

## 2016-02-27 DIAGNOSIS — K219 Gastro-esophageal reflux disease without esophagitis: Secondary | ICD-10-CM | POA: Diagnosis present

## 2016-02-27 DIAGNOSIS — Z515 Encounter for palliative care: Secondary | ICD-10-CM | POA: Insufficient documentation

## 2016-02-27 DIAGNOSIS — I482 Chronic atrial fibrillation, unspecified: Secondary | ICD-10-CM | POA: Insufficient documentation

## 2016-02-27 DIAGNOSIS — E785 Hyperlipidemia, unspecified: Secondary | ICD-10-CM | POA: Diagnosis present

## 2016-02-27 DIAGNOSIS — R05 Cough: Secondary | ICD-10-CM | POA: Insufficient documentation

## 2016-02-27 DIAGNOSIS — E876 Hypokalemia: Secondary | ICD-10-CM

## 2016-02-27 DIAGNOSIS — Z888 Allergy status to other drugs, medicaments and biological substances status: Secondary | ICD-10-CM

## 2016-02-27 DIAGNOSIS — J449 Chronic obstructive pulmonary disease, unspecified: Secondary | ICD-10-CM | POA: Diagnosis present

## 2016-02-27 DIAGNOSIS — G629 Polyneuropathy, unspecified: Secondary | ICD-10-CM | POA: Diagnosis present

## 2016-02-27 DIAGNOSIS — I739 Peripheral vascular disease, unspecified: Secondary | ICD-10-CM | POA: Diagnosis present

## 2016-02-27 DIAGNOSIS — M797 Fibromyalgia: Secondary | ICD-10-CM | POA: Diagnosis present

## 2016-02-27 DIAGNOSIS — R570 Cardiogenic shock: Secondary | ICD-10-CM | POA: Insufficient documentation

## 2016-02-27 DIAGNOSIS — Z7901 Long term (current) use of anticoagulants: Secondary | ICD-10-CM | POA: Diagnosis not present

## 2016-02-27 DIAGNOSIS — E669 Obesity, unspecified: Secondary | ICD-10-CM | POA: Diagnosis present

## 2016-02-27 DIAGNOSIS — N184 Chronic kidney disease, stage 4 (severe): Secondary | ICD-10-CM | POA: Diagnosis present

## 2016-02-27 DIAGNOSIS — E1122 Type 2 diabetes mellitus with diabetic chronic kidney disease: Secondary | ICD-10-CM | POA: Diagnosis not present

## 2016-02-27 DIAGNOSIS — N179 Acute kidney failure, unspecified: Secondary | ICD-10-CM | POA: Diagnosis present

## 2016-02-27 DIAGNOSIS — G8929 Other chronic pain: Secondary | ICD-10-CM | POA: Diagnosis present

## 2016-02-27 DIAGNOSIS — Z66 Do not resuscitate: Secondary | ICD-10-CM | POA: Diagnosis present

## 2016-02-27 DIAGNOSIS — I5023 Acute on chronic systolic (congestive) heart failure: Secondary | ICD-10-CM | POA: Diagnosis present

## 2016-02-27 DIAGNOSIS — G4700 Insomnia, unspecified: Secondary | ICD-10-CM | POA: Diagnosis not present

## 2016-02-27 DIAGNOSIS — R339 Retention of urine, unspecified: Secondary | ICD-10-CM | POA: Diagnosis not present

## 2016-02-27 DIAGNOSIS — Z87891 Personal history of nicotine dependence: Secondary | ICD-10-CM

## 2016-02-27 DIAGNOSIS — I130 Hypertensive heart and chronic kidney disease with heart failure and stage 1 through stage 4 chronic kidney disease, or unspecified chronic kidney disease: Secondary | ICD-10-CM | POA: Diagnosis present

## 2016-02-27 DIAGNOSIS — M109 Gout, unspecified: Secondary | ICD-10-CM | POA: Diagnosis present

## 2016-02-27 DIAGNOSIS — I429 Cardiomyopathy, unspecified: Secondary | ICD-10-CM | POA: Diagnosis present

## 2016-02-27 DIAGNOSIS — E875 Hyperkalemia: Secondary | ICD-10-CM | POA: Insufficient documentation

## 2016-02-27 DIAGNOSIS — E871 Hypo-osmolality and hyponatremia: Secondary | ICD-10-CM | POA: Diagnosis present

## 2016-02-27 DIAGNOSIS — R0600 Dyspnea, unspecified: Secondary | ICD-10-CM | POA: Diagnosis not present

## 2016-02-27 DIAGNOSIS — R059 Cough, unspecified: Secondary | ICD-10-CM | POA: Insufficient documentation

## 2016-02-27 LAB — CBC
HEMATOCRIT: 40.1 % (ref 39.0–52.0)
Hemoglobin: 12.7 g/dL — ABNORMAL LOW (ref 13.0–17.0)
MCH: 26.7 pg (ref 26.0–34.0)
MCHC: 31.7 g/dL (ref 30.0–36.0)
MCV: 84.4 fL (ref 78.0–100.0)
PLATELETS: 337 10*3/uL (ref 150–400)
RBC: 4.75 MIL/uL (ref 4.22–5.81)
RDW: 17 % — AB (ref 11.5–15.5)
WBC: 7.3 10*3/uL (ref 4.0–10.5)

## 2016-02-27 LAB — I-STAT TROPONIN, ED: Troponin i, poc: 0.13 ng/mL (ref 0.00–0.08)

## 2016-02-27 LAB — BASIC METABOLIC PANEL
Anion gap: 19 — ABNORMAL HIGH (ref 5–15)
BUN: 52 mg/dL — AB (ref 6–20)
CHLORIDE: 88 mmol/L — AB (ref 101–111)
CO2: 30 mmol/L (ref 22–32)
CREATININE: 2.22 mg/dL — AB (ref 0.61–1.24)
Calcium: 10.3 mg/dL (ref 8.9–10.3)
GFR calc Af Amer: 34 mL/min — ABNORMAL LOW (ref >60)
GFR calc non Af Amer: 29 mL/min — ABNORMAL LOW (ref >60)
GLUCOSE: 91 mg/dL (ref 65–99)
POTASSIUM: 2.9 mmol/L — AB (ref 3.5–5.1)
SODIUM: 137 mmol/L (ref 135–145)

## 2016-02-27 LAB — TROPONIN I: TROPONIN I: 0.14 ng/mL — AB (ref <0.031)

## 2016-02-27 LAB — GLUCOSE, CAPILLARY
GLUCOSE-CAPILLARY: 134 mg/dL — AB (ref 65–99)
GLUCOSE-CAPILLARY: 133 mg/dL — AB (ref 65–99)

## 2016-02-27 LAB — PROTIME-INR
INR: 3.57 — AB (ref 0.00–1.49)
PROTHROMBIN TIME: 34.9 s — AB (ref 11.6–15.2)

## 2016-02-27 LAB — BRAIN NATRIURETIC PEPTIDE: B Natriuretic Peptide: 864.7 pg/mL — ABNORMAL HIGH (ref 0.0–100.0)

## 2016-02-27 MED ORDER — WARFARIN - PHARMACIST DOSING INPATIENT: Freq: Every day | Status: DC

## 2016-02-27 MED ORDER — METOLAZONE 2.5 MG PO TABS
2.5000 mg | ORAL_TABLET | ORAL | Status: DC | PRN
  Administered 2016-02-27: 2.5 mg via ORAL
  Filled 2016-02-27 (×3): qty 1

## 2016-02-27 MED ORDER — WARFARIN SODIUM 2 MG PO TABS
2.0000 mg | ORAL_TABLET | Freq: Once | ORAL | Status: AC
  Administered 2016-02-27: 2 mg via ORAL
  Filled 2016-02-27 (×2): qty 1

## 2016-02-27 MED ORDER — IPRATROPIUM-ALBUTEROL 0.5-2.5 (3) MG/3ML IN SOLN
3.0000 mL | Freq: Four times a day (QID) | RESPIRATORY_TRACT | Status: DC
  Administered 2016-02-27 (×3): 3 mL via RESPIRATORY_TRACT
  Filled 2016-02-27 (×3): qty 3

## 2016-02-27 MED ORDER — TORSEMIDE 100 MG PO TABS
100.0000 mg | ORAL_TABLET | Freq: Two times a day (BID) | ORAL | Status: DC
  Administered 2016-02-27 – 2016-02-29 (×4): 100 mg via ORAL
  Filled 2016-02-27 (×5): qty 1

## 2016-02-27 MED ORDER — OXYCODONE-ACETAMINOPHEN 5-325 MG PO TABS
1.0000 | ORAL_TABLET | Freq: Four times a day (QID) | ORAL | Status: DC | PRN
  Administered 2016-02-27 – 2016-02-28 (×3): 1 via ORAL
  Filled 2016-02-27 (×3): qty 1

## 2016-02-27 MED ORDER — SODIUM CHLORIDE 0.9% FLUSH
3.0000 mL | Freq: Two times a day (BID) | INTRAVENOUS | Status: DC
  Administered 2016-02-27 – 2016-03-02 (×4): 3 mL via INTRAVENOUS

## 2016-02-27 MED ORDER — SODIUM CHLORIDE 0.9% FLUSH: 3.0000 mL | INTRAVENOUS | Status: DC | PRN

## 2016-02-27 MED ORDER — DICLOFENAC EPOLAMINE 1.3 % TD PTCH: 1.0000 | MEDICATED_PATCH | Freq: Two times a day (BID) | TRANSDERMAL | Status: DC | PRN

## 2016-02-27 MED ORDER — POTASSIUM CHLORIDE CRYS ER 20 MEQ PO TBCR
40.0000 meq | EXTENDED_RELEASE_TABLET | Freq: Once | ORAL | Status: AC
  Administered 2016-02-27: 40 meq via ORAL
  Filled 2016-02-27: qty 2

## 2016-02-27 MED ORDER — OMEGA-3-ACID ETHYL ESTERS 1 G PO CAPS
1.0000 g | ORAL_CAPSULE | Freq: Every day | ORAL | Status: DC
  Administered 2016-02-27 – 2016-02-29 (×3): 1 g via ORAL
  Filled 2016-02-27 (×3): qty 1

## 2016-02-27 MED ORDER — POTASSIUM CHLORIDE CRYS ER 20 MEQ PO TBCR
40.0000 meq | EXTENDED_RELEASE_TABLET | Freq: Two times a day (BID) | ORAL | Status: DC
  Administered 2016-02-27 – 2016-02-28 (×4): 40 meq via ORAL
  Filled 2016-02-27 (×4): qty 2

## 2016-02-27 MED ORDER — DOBUTAMINE IN D5W 4-5 MG/ML-% IV SOLN: 2.0000 ug/kg/min | INTRAVENOUS | Status: DC

## 2016-02-27 MED ORDER — TIOTROPIUM BROMIDE MONOHYDRATE 18 MCG IN CAPS
18.0000 ug | ORAL_CAPSULE | Freq: Every day | RESPIRATORY_TRACT | Status: DC
  Administered 2016-02-27 – 2016-03-02 (×5): 18 ug via RESPIRATORY_TRACT
  Filled 2016-02-27 (×2): qty 5

## 2016-02-27 MED ORDER — SODIUM CHLORIDE 0.9 % IV SOLN: 250.0000 mL | INTRAVENOUS | Status: DC | PRN

## 2016-02-27 MED ORDER — MULTI-VITAMIN/MINERALS PO TABS: 1.0000 | ORAL_TABLET | Freq: Every day | ORAL | Status: DC

## 2016-02-27 MED ORDER — DOCUSATE SODIUM 100 MG PO CAPS
100.0000 mg | ORAL_CAPSULE | Freq: Two times a day (BID) | ORAL | Status: DC
  Administered 2016-02-27 – 2016-02-29 (×5): 100 mg via ORAL
  Filled 2016-02-27 (×5): qty 1

## 2016-02-27 MED ORDER — POTASSIUM CHLORIDE 10 MEQ/100ML IV SOLN
10.0000 meq | INTRAVENOUS | Status: AC
  Administered 2016-02-27: 10 meq via INTRAVENOUS

## 2016-02-27 MED ORDER — POLYETHYLENE GLYCOL 3350 17 G PO PACK
17.0000 g | PACK | Freq: Every day | ORAL | Status: DC
  Administered 2016-02-27 – 2016-03-02 (×5): 17 g via ORAL
  Filled 2016-02-27 (×5): qty 1

## 2016-02-27 MED ORDER — ALBUTEROL SULFATE HFA 108 (90 BASE) MCG/ACT IN AERS: 2.0000 | INHALATION_SPRAY | Freq: Four times a day (QID) | RESPIRATORY_TRACT | Status: DC | PRN

## 2016-02-27 MED ORDER — ACETAMINOPHEN 325 MG PO TABS
650.0000 mg | ORAL_TABLET | ORAL | Status: DC | PRN
  Administered 2016-02-29 – 2016-03-01 (×5): 650 mg via ORAL
  Filled 2016-02-27 (×5): qty 2

## 2016-02-27 MED ORDER — HYDRALAZINE HCL 50 MG PO TABS
100.0000 mg | ORAL_TABLET | Freq: Three times a day (TID) | ORAL | Status: DC
  Administered 2016-02-27 – 2016-03-02 (×11): 100 mg via ORAL
  Filled 2016-02-27 (×3): qty 2
  Filled 2016-02-27: qty 4
  Filled 2016-02-27 (×5): qty 2
  Filled 2016-02-27: qty 4
  Filled 2016-02-27 (×2): qty 2

## 2016-02-27 MED ORDER — OXYCODONE HCL 5 MG PO TABS
5.0000 mg | ORAL_TABLET | Freq: Four times a day (QID) | ORAL | Status: DC | PRN
  Administered 2016-02-28: 5 mg via ORAL
  Filled 2016-02-27: qty 1

## 2016-02-27 MED ORDER — ONDANSETRON HCL 4 MG/2ML IJ SOLN
4.0000 mg | Freq: Four times a day (QID) | INTRAMUSCULAR | Status: DC | PRN
  Administered 2016-02-28 – 2016-03-02 (×4): 4 mg via INTRAVENOUS
  Filled 2016-02-27 (×4): qty 2

## 2016-02-27 MED ORDER — OXYCODONE-ACETAMINOPHEN 10-325 MG PO TABS: 1.0000 | ORAL_TABLET | Freq: Four times a day (QID) | ORAL | Status: DC | PRN

## 2016-02-27 MED ORDER — PREGABALIN 75 MG PO CAPS
75.0000 mg | ORAL_CAPSULE | Freq: Two times a day (BID) | ORAL | Status: DC
  Administered 2016-02-27 – 2016-02-29 (×5): 75 mg via ORAL
  Filled 2016-02-27 (×5): qty 1

## 2016-02-27 MED ORDER — IPRATROPIUM-ALBUTEROL 0.5-2.5 (3) MG/3ML IN SOLN
3.0000 mL | Freq: Three times a day (TID) | RESPIRATORY_TRACT | Status: DC
  Administered 2016-02-27 – 2016-03-02 (×11): 3 mL via RESPIRATORY_TRACT
  Filled 2016-02-27 (×11): qty 3

## 2016-02-27 MED ORDER — ADULT MULTIVITAMIN W/MINERALS CH
1.0000 | ORAL_TABLET | Freq: Every day | ORAL | Status: DC
  Administered 2016-02-27 – 2016-02-29 (×3): 1 via ORAL
  Filled 2016-02-27 (×3): qty 1

## 2016-02-27 MED ORDER — ALLOPURINOL 100 MG PO TABS
100.0000 mg | ORAL_TABLET | Freq: Every day | ORAL | Status: DC
  Administered 2016-02-27 – 2016-02-29 (×3): 100 mg via ORAL
  Filled 2016-02-27 (×3): qty 1

## 2016-02-27 MED ORDER — POTASSIUM CHLORIDE 10 MEQ/100ML IV SOLN
INTRAVENOUS | Status: AC
  Filled 2016-02-27: qty 100

## 2016-02-27 MED ORDER — ALBUTEROL SULFATE (2.5 MG/3ML) 0.083% IN NEBU: 2.5000 mg | INHALATION_SOLUTION | RESPIRATORY_TRACT | Status: DC | PRN

## 2016-02-27 MED ORDER — BACLOFEN 10 MG PO TABS
10.0000 mg | ORAL_TABLET | Freq: Three times a day (TID) | ORAL | Status: DC | PRN
  Administered 2016-02-28 – 2016-03-02 (×2): 10 mg via ORAL
  Filled 2016-02-27 (×2): qty 1

## 2016-02-27 MED ORDER — POTASSIUM CHLORIDE 10 MEQ/100ML IV SOLN
10.0000 meq | INTRAVENOUS | Status: AC
  Administered 2016-02-27 (×2): 10 meq via INTRAVENOUS
  Filled 2016-02-27 (×2): qty 100

## 2016-02-27 MED ORDER — ASPIRIN 81 MG PO CHEW
324.0000 mg | CHEWABLE_TABLET | Freq: Once | ORAL | Status: AC
  Administered 2016-02-27: 324 mg via ORAL
  Filled 2016-02-27: qty 4

## 2016-02-27 MED ORDER — TORSEMIDE 100 MG PO TABS: 100.0000 mg | ORAL_TABLET | Freq: Two times a day (BID) | ORAL | Status: DC

## 2016-02-27 MED ORDER — ISOSORBIDE MONONITRATE ER 60 MG PO TB24
120.0000 mg | ORAL_TABLET | Freq: Every day | ORAL | Status: DC
  Administered 2016-02-27 – 2016-03-02 (×5): 120 mg via ORAL
  Filled 2016-02-27 (×5): qty 2

## 2016-02-27 MED ORDER — FUROSEMIDE 10 MG/ML IJ SOLN
40.0000 mg | Freq: Once | INTRAMUSCULAR | Status: AC
  Administered 2016-02-27: 40 mg via INTRAVENOUS
  Filled 2016-02-27: qty 4

## 2016-02-27 NOTE — Progress Notes (Signed)
Patient admitted after midnight.  Refusing dobutamine.  Down at least 600 ml although patient says he has urinated 2 full urinals.  Not on O2. Await CHF team to consult--- re-consulted palliative care per recs of HF team  Marlin Canary DO

## 2016-02-27 NOTE — Patient Outreach (Signed)
[facility]) Care Management  02/27/2016  Frank Davidson 03-01-1951 [id number]  RN attempted once outreach call to pt however family member indicated pt has be admitted with breathing problems earlier today. RN will notify the hospital liaison and follow up accordingly.   [name], RN Care Management Coordinator [facility] Main Office [phone]

## 2016-02-27 NOTE — ED Notes (Signed)
Family at the bedside.

## 2016-02-27 NOTE — Progress Notes (Signed)
ANTICOAGULATION CONSULT NOTE - Initial Consult  Pharmacy Consult for Coumadin Indication: PAF and h/o CVA  Allergies  Allergen Reactions  . Corlanor [Ivabradine] Palpitations and Other (See Comments)    Kidney and heart problems, syncope  . Entresto [Sacubitril-Valsartan]     Syncope  . Januvia [Sitagliptin] Other (See Comments)    "Almost killed me"   . Lunesta [Eszopiclone] Palpitations and Other (See Comments)    Kidney and heart problems, syncope  . Actos [Pioglitazone] Other (See Comments)    Caused blood in urine and fluid retention     Patient Measurements: Height: 5\' 10"  (177.8 cm) Weight: 209 lb (94.802 kg) IBW/kg (Calculated) : 73  Vital Signs: Temp: 97.1 F (36.2 C) (04/07 0255) Temp Source: Axillary (04/07 0255) BP: 116/80 mmHg (04/07 0430) Pulse Rate: 83 (04/07 0430)  Labs:  Recent Labs  02/27/16 0258  HGB 12.7*  HCT 40.1  PLT 337  LABPROT 34.9*  INR 3.57*  CREATININE 2.22*  TROPONINI 0.14*    Estimated Creatinine Clearance: 38.3 mL/min (by C-G formula based on Cr of 2.22).   Medical History: Past Medical History  Diagnosis Date  . Atrial fibrillation (HCC)     PAF  . Hypertension   . COPD (chronic obstructive pulmonary disease) (HCC)   . Ocular herpes   . Gout   . CHF (congestive heart failure) (HCC)   . Chronic pain   . Hyperlipidemia   . Nonischemic cardiomyopathy (HCC) Aug 2015    EF 15%  . Ocular herpes   . Neuropathy (HCC)   . Left-sided weakness     "because of the arthritis and edema"  . PVD (peripheral vascular disease) [facility]) May 2015    abnormal ABIs  . Memory impairment     "short term"  . CKD (chronic kidney disease), stage III     Dr. Briant Cedar follows-holding   . Fibromyalgia     neuropathy- hands, more than feet.  . Sleep apnea with use of continuous positive airway pressure (CPAP)     does not use cpap  . GERD (gastroesophageal reflux disease)     tums as needed  . Asthma   . Type II diabetes mellitus (HCC)    . Stroke [facility]) X 5    last stroke aug 2014"; denies residual on 09/12/2014  . Osteoarthritis   . Arthritis     "knees, right elbow, shoulder" (09/12/2014)  . Shortness of breath dyspnea     Assessment: 65yo male c/o worsening SOB over ~12hr, eventually also experienced generalized weakness, admitted for possible CHF exacerbation, to continue Coumadin for PAF and h/o CVA; current INR above goal w/ last dose of Coumadin taken 4/6; of note pt was seen at anti-coag clinic on 4/4 when INR was 5.2, instructed to skip two days and decrease weekly dose by 5mg  (was 60mg  weekly, changed to 55mg  weekly) so INR may still be on its way down.  Goal of Therapy:  INR 2-3   Plan:  Will give small Coumadin dose of 2mg  today and monitor INR for dose adjustments.  Vernard Gambles, PharmD, BCPS  02/27/2016,5:41 AM

## 2016-02-27 NOTE — ED Notes (Signed)
Per EMS, pt began having sob 11 hours ago and it has gotten progressively worse since. Tachypneic, 96% on RA on scene. Lung sounds clear and slightly decreased in lower lobes. Pt has hx of chf. No complains of pain. No n/v/d. No cyanosis or diaphoresis. Pt had picc line removed earlier which precipitated this event. VSS, BP 109/75, HR 82 in a-fib, RR 28, 99% on 4L. Pt alert and oriented x 4. CBG 154.

## 2016-02-27 NOTE — ED Provider Notes (Signed)
By signing my name below, I, Budd Palmer, attest that this documentation has been prepared under the direction and in the presence of [employer], DO. Electronically Signed: Budd Palmer, ED Scribe. 02/27/2016. 3:37 AM.  TIME SEEN: 3:12 AM  CHIEF COMPLAINT: SOB  HPI: Frank Davidson is a 65 y.o. male former smoker with a PMHx of COPD, asthma, A-fib on Coumadin, CHF (EF 10-15%), HTN, HLD, PVD, stroke, and DM brought in by ambulance who presents to the Emergency Department complaining of worsening SOB onset 12 hours ago. He reports associated generalized weakness onset 4 hours ago. He notes exacerbation of the SOB with activity and lying supine. Pt states this began after having a PICC line, which he had to receive medication (dobutamine) for heart failure. He notes that this medication was stopped because it made him feel fatigued. He states he does not usually wear oxygen at home. He notes he is on blood thinners and has not missed any doses of that. He reports his cardiologist is Dr Gala Romney. Pt denies chest pain or discomfort, fever, and cough.  ROS: See HPI Constitutional: no fever  Eyes: no drainage  ENT: no runny nose   Cardiovascular:  no chest pain  Resp: SOB  GI: no vomiting GU: no dysuria Integumentary: no rash  Allergy: no hives  Musculoskeletal: no leg swelling  Neurological: no slurred speech ROS otherwise negative  PAST MEDICAL HISTORY/PAST SURGICAL HISTORY:  Past Medical History  Diagnosis Date  . Atrial fibrillation (HCC)     PAF  . Hypertension   . COPD (chronic obstructive pulmonary disease) (HCC)   . Ocular herpes   . Gout   . CHF (congestive heart failure) (HCC)   . Chronic pain   . Hyperlipidemia   . Nonischemic cardiomyopathy (HCC) Aug 2015    EF 15%  . Ocular herpes   . Neuropathy (HCC)   . Left-sided weakness     "because of the arthritis and edema"  . PVD (peripheral vascular disease) [facility]) May 2015    abnormal ABIs  . Memory impairment      "short term"  . CKD (chronic kidney disease), stage III     Dr. Briant Cedar follows-holding   . Fibromyalgia     neuropathy- hands, more than feet.  . Sleep apnea with use of continuous positive airway pressure (CPAP)     does not use cpap  . GERD (gastroesophageal reflux disease)     tums as needed  . Asthma   . Type II diabetes mellitus (HCC)   . Stroke [facility]) X 5    last stroke aug 2014"; denies residual on 09/12/2014  . Osteoarthritis   . Arthritis     "knees, right elbow, shoulder" (09/12/2014)  . Shortness of breath dyspnea     MEDICATIONS:  Prior to Admission medications   Medication Sig Start Date End Date Taking? Authorizing Provider  albuterol (PROVENTIL HFA;VENTOLIN HFA) 108 (90 BASE) MCG/ACT inhaler Inhale 2 puffs into the lungs every 6 (six) hours as needed for wheezing or shortness of breath. 06/30/15   Gwenlyn Found Copland, MD  albuterol (PROVENTIL) (2.5 MG/3ML) 0.083% nebulizer solution Take 3 mLs (2.5 mg total) by nebulization every 4 (four) hours as needed for wheezing or shortness of breath. 11/26/15   Carmelina Dane, MD  allopurinol (ZYLOPRIM) 100 MG tablet TAKE 1 TABLET (100 MG TOTAL) BY MOUTH DAILY. 10/30/15   Gwenlyn Found Copland, MD  baclofen (LIORESAL) 10 MG tablet TAKE 1 TABLET EVERY 8 HOURS AS  NEEDED FOR MUSCLE SPASM(S) 01/12/16   Wallis Bamberg, PA-C  diclofenac (FLECTOR) 1.3 % PTCH Place 1 patch onto the skin 2 (two) times daily as needed (pain). 11/11/15   Gwenlyn Found Copland, MD  DOBUTamine (DOBUTREX) 4-5 MG/ML-% infusion Inject 190.4 mcg/min into the vein continuous. From [facility] pharmacy x 12 months 02/12/16   Amy D Filbert Schilder, NP  docusate sodium 100 MG CAPS Take 100 mg by mouth 2 (two) times daily. 09/16/14   Lanney Gins, PA-C  hydrALAZINE (APRESOLINE) 100 MG tablet Take 1 tablet (100 mg total) by mouth 3 (three) times daily. 02/12/16   Amy D Clegg, NP  ipratropium-albuterol (DUONEB) 0.5-2.5 (3) MG/3ML SOLN Take 3 mLs by nebulization 4 (four) times daily. 12/23/15   Michele Mcalpine, MD  isosorbide mononitrate (IMDUR) 120 MG 24 hr tablet Take 1 tablet (120 mg total) by mouth daily. 02/12/16   Amy D Clegg, NP  metolazone (ZAROXOLYN) 2.5 MG tablet Take 1 tablet (2.5 mg total) by mouth as needed. Tuesday and Friday 02/12/16   Amy D Filbert Schilder, NP  Multiple Vitamins-Minerals (MULTIVITAMIN WITH MINERALS) tablet Take 1 tablet by mouth daily.    Historical Provider, MD  omega-3 acid ethyl esters (LOVAZA) 1 G capsule Take 1 g by mouth daily.    Historical Provider, MD  oxyCODONE-acetaminophen (PERCOCET) 10-325 MG tablet Take 1 tablet by mouth every 6 (six) hours as needed for pain. Use with stool softerner. 02/23/16   Gwenlyn Found Copland, MD  polyethylene glycol powder (GLYCOLAX/MIRALAX) powder MIX 1 CAPFUL (17GM)  IN  LIQUID  AND  DRINK EVERY DAY AS DIRECTED 07/31/15   Gwenlyn Found Copland, MD  potassium chloride SA (K-DUR,KLOR-CON) 20 MEQ tablet Take 2 tablets (40 mEq total) by mouth 2 (two) times daily. 02/23/16   Graciella Freer, PA-C  pregabalin (LYRICA) 75 MG capsule Take 75 mg by mouth 2 (two) times daily.    Historical Provider, MD  tiotropium (SPIRIVA HANDIHALER) 18 MCG inhalation capsule Place 1 capsule (18 mcg total) into inhaler and inhale daily. 09/29/15   Michele Mcalpine, MD  torsemide (DEMADEX) 20 MG tablet Take 5 tablets (100 mg total) by mouth 2 (two) times daily. 02/12/16   Amy D Filbert Schilder, NP  warfarin (COUMADIN) 10 MG tablet Take 10 mg daily until follow up 02/12/16   Amy D Filbert Schilder, NP    ALLERGIES:  Allergies  Allergen Reactions  . Corlanor [Ivabradine] Palpitations and Other (See Comments)    Kidney and heart problems, syncope  . Entresto [Sacubitril-Valsartan]     Syncope  . Januvia [Sitagliptin] Other (See Comments)    "Almost killed me"   . Lunesta [Eszopiclone] Palpitations and Other (See Comments)    Kidney and heart problems, syncope  . Actos [Pioglitazone] Other (See Comments)    Caused blood in urine and fluid retention     SOCIAL HISTORY:  Social History   Substance Use Topics  . Smoking status: Former Smoker -- 0.50 packs/day for 13 years    Types: Cigarettes    Quit date: 12/18/2006  . Smokeless tobacco: Never Used  . Alcohol Use: No     Comment: Quit 2008    FAMILY HISTORY: Family History  Problem Relation Age of Onset  . Cancer Mother   . Hypertension Mother   . Lung disease Mother   . Diabetes Father   . Diabetes Sister   . Hypertension Sister   . Hypertension Brother   . Hypertension Sister   . Colon cancer Neg Hx  EXAM: BP 155/132 mmHg  Pulse 110  Temp(Src) 97.1 F (36.2 C) (Axillary)  Resp 18  Ht  (1.778 m)  Wt 209 lb (94.802 kg)  BMI 29.99 kg/m2  SpO2 100% CONSTITUTIONAL: Alert and oriented and responds appropriately to questions. Chronically ill-appearing HEAD: Normocephalic EYES: Conjunctivae clear, PERRL ENT: normal nose; no rhinorrhea; moist mucous membranes NECK: Supple, no meningismus, no LAD  CARD: RRR; S1 and S2 appreciated; no murmurs, no clicks, no rubs, no gallops RESP: Normal chest excursion without splinting or tachypnea; breath sounds clear and equal bilaterally; no wheezes, no rhonchi, no rales, no hypoxia or respiratory distress, speaking full sentences; Pt appears very short of breath speaking in short sentences and with minimal movement in the bed ABD/GI: Normal bowel sounds; non-distended; soft, non-tender, no rebound, no guarding, no peritoneal signs BACK:  The back appears normal and is non-tender to palpation, there is no CVA tenderness EXT: Normal ROM in all joints; non-tender to palpation; Mild bilateral lower extremity nonpitting edema; normal capillary refill; no cyanosis, no calf tenderness or swelling    SKIN: Normal color for age and race; warm; no rash NEURO: Moves all extremities equally, sensation to light touch intact diffusely, cranial nerves II through XII intact PSYCH: The patient's mood and manner are appropriate. Grooming and personal hygiene are  appropriate.  MEDICAL DECISION MAKING: Patient here shortness of breath. He feels that he is having a CHF exacerbation. Lungs seem clear to auscultation. He does however seem very short of breath with just minimal movement in the bed. No hypoxia currently at rest on room air. No chest pain or chest discomfort. EKG shows sinus rhythm with first-degree AV block with no new changes compared to 2015. We'll obtain cardiac labs, chest x-ray.  ED PROGRESS: Patient's troponin is mildly elevated in the setting of cardiomyopathy and chronic kidney disease. He has received aspirin. BNP is mildly elevated at 864. INR is supratherapeutic at 3.57. Chest x-ray shows cardiomegaly without infiltrate or significant edema. Potassium is 2.9 without EKG changes, will replace.  We'll ambulate patient in the ED.  4:50 AM  Attempted to ambulate patient in the emergency department. With just sitting upright in the bed he becomes too short of breath to even stand or walk. No drop in oxygen during this time but he did have significant increased work of breathing. This may be a CHF exacerbation. Doubt COPD. Pulmonary embolus is in the differential given recent hospitalization in the setting of a chronically ill, mostly immobilized patient. His INR is supratherapeutic. This time we cannot obtain a CT of his chest because of his kidney function. We'll discuss with hospitalist for admission. I have given him a dose of IV Lasix.   5:10 AM  D/w Dr. Julian Reil who has seen patient and feels he will need inotropes. Suspect that his shortness of breath is secondary to his poor ejection fraction rather than true volume overload at this time. I agree. We will consult cardiology as he will likely need cardiac ICU admission, inotropes.  5:55 AM  D/w Dr. Tresa Endo with cardiology service. He will have the heart failure team see the patient first thing this morning. He feels the patient can be admitted to a stepdown bed to medicine. He states that we  can start dobutamine through a peripheral IV and have the PICC line team place a PICC line today.  Discussed with Dr. Julian Reil. We will admit to a stepdown bed. Patient has been updated with this plan. This time he is  hemodynamically stable and has no oxygen requirement.   EKG Interpretation  Date/Time:  Friday February 27 2016 02:54:20 EDT Ventricular Rate:  95 PR Interval:    QRS Duration: 114 QT Interval:  437 QTC Calculation: 549 R Axis:   -58 Text Interpretation:  Sinus rhythm with 1st degree AV block Ventricular premature complex Borderline IVCD with LAD Inferior infarct, old Lateral leads are also involved Prolonged QT interval No significant change since last tracing Artifact Confirmed by Kaliyah Gladman,  DO, Meda Dudzinski ([id number]) on 02/27/2016 3:36:28 AM        CRITICAL CARE Performed by: Raelyn Number   Total critical care time: 40 minutes  Critical care time was exclusive of separately billable procedures and treating other patients.  Critical care was necessary to treat or prevent imminent or life-threatening deterioration.  Critical care was time spent personally by me on the following activities: development of treatment plan with patient and/or surrogate as well as nursing, discussions with consultants, evaluation of patient's response to treatment, examination of patient, obtaining history from patient or surrogate, ordering and performing treatments and interventions, ordering and review of laboratory studies, ordering and review of radiographic studies, pulse oximetry and re-evaluation of patient's condition.    I personally performed the services described in this documentation, which was scribed in my presence. The recorded information has been reviewed and is accurate.   Layla Maw Neal Trulson, DO 02/27/16 [phone]

## 2016-02-27 NOTE — ED Notes (Signed)
Pt tolerates neb treatment well.

## 2016-02-27 NOTE — H&P (Signed)
[facility] History and Physical  Frank Davidson ZOX:[id number] DOB: 1951/05/15 DOA: 02/27/2016  Referring physician: EDP PCP: Abbe Amsterdam, MD   Chief Complaint: SOB   HPI: Frank Davidson is a 65 y.o. male with extensive history of NICM and systolic CHF.  Patient was recently admitted a couple of weeks ago for CHF exacerbation.  Ultimately he was seen by the heart failure service started on and discharged on a continuous dobutamine infusion.  Patient had been doing okay on the infusion, but felt that the infusion was making him more fatigued and requested that it be stopped.  Yesterday at 3 pm the infusion was stopped.  At around 7pm he began to develop severe SOB, DOE, orthopnea.  Patient is brought in to the ED.  Review of Systems: Systems reviewed.  As above, otherwise negative  Past Medical History  Diagnosis Date  . Atrial fibrillation (HCC)     PAF  . Hypertension   . COPD (chronic obstructive pulmonary disease) (HCC)   . Ocular herpes   . Gout   . CHF (congestive heart failure) (HCC)   . Chronic pain   . Hyperlipidemia   . Nonischemic cardiomyopathy (HCC) Aug 2015    EF 15%  . Ocular herpes   . Neuropathy (HCC)   . Left-sided weakness     "because of the arthritis and edema"  . PVD (peripheral vascular disease) [facility]) May 2015    abnormal ABIs  . Memory impairment     "short term"  . CKD (chronic kidney disease), stage III     Dr. Briant Cedar follows-holding   . Fibromyalgia     neuropathy- hands, more than feet.  . Sleep apnea with use of continuous positive airway pressure (CPAP)     does not use cpap  . GERD (gastroesophageal reflux disease)     tums as needed  . Asthma   . Type II diabetes mellitus (HCC)   . Stroke [facility]) X 5    last stroke aug 2014"; denies residual on 09/12/2014  . Osteoarthritis   . Arthritis     "knees, right elbow, shoulder" (09/12/2014)  . Shortness of breath dyspnea    Past Surgical History  Procedure Laterality Date   . 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 Tonsillectomy  age 58  . Esophagogastroduodenoscopy N/A 12/24/2013    Procedure: ESOPHAGOGASTRODUODENOSCOPY (EGD);  Surgeon: Louis Meckel, MD;  Location: [facility];  Service: Endoscopy;  Laterality: N/A;  . Colonoscopy N/A 12/24/2013    Procedure: COLONOSCOPY;  Surgeon: Louis Meckel, MD;  Location: [facility] ENDOSCOPY;  Service: Endoscopy;  Laterality: N/A;  . Joint replacement    . Cardiac catheterization  10/2006    normal coronary arteries;   Marland Kitchen Cataract extraction w/ intraocular lens  implant, bilateral Bilateral ~ 2008  . Total hip arthroplasty Left 09/12/2014    Procedure: LEFT TOTAL HIP ARTHROPLASTY ANTERIOR APPROACH;  Surgeon: [name], MD;  Location[facility];  Service: Orthopedics;  Laterality: Left;  . Right heart catheterization N/A 08/13/2014    Rt ht cath prior to hip surgery  . Cardiac catheterization N/A 02/06/2016    Procedure: Right Heart Cath;  Surgeon: Dolores Patty, MD;  Location: [facility] INVASIVE CV LAB;  Service: Cardiovascular;  Laterality: N/A;   Social History:  reports that he quit smoking about 9 years ago. His smoking use included Cigarettes. He has a 6.5 pack-year smoking history. He has never used smokeless tobacco. He reports that he  does not drink alcohol or use illicit drugs.  Allergies  Allergen Reactions  . Corlanor [Ivabradine] Palpitations and Other (See Comments)    Kidney and heart problems, syncope  . Entresto [Sacubitril-Valsartan]     Syncope  . Januvia [Sitagliptin] Other (See Comments)    "Almost killed me"   . Lunesta [Eszopiclone] Palpitations and Other (See Comments)    Kidney and heart problems, syncope  . Actos [Pioglitazone] Other (See Comments)    Caused blood in urine and fluid retention     Family History  Problem Relation Age of Onset  . Cancer Mother   . Hypertension Mother   . Lung disease Mother   . Diabetes Father   . Diabetes Sister   . Hypertension Sister   . Hypertension  Brother   . Hypertension Sister   . Colon cancer Neg Hx      Prior to Admission medications   Medication Sig Start Date End Date Taking? Authorizing Provider  albuterol (PROVENTIL HFA;VENTOLIN HFA) 108 (90 BASE) MCG/ACT inhaler Inhale 2 puffs into the lungs every 6 (six) hours as needed for wheezing or shortness of breath. 06/30/15  Yes Gwenlyn Found Copland, MD  albuterol (PROVENTIL) (2.5 MG/3ML) 0.083% nebulizer solution Take 3 mLs (2.5 mg total) by nebulization every 4 (four) hours as needed for wheezing or shortness of breath. 11/26/15  Yes Carmelina Dane, MD  allopurinol (ZYLOPRIM) 100 MG tablet TAKE 1 TABLET (100 MG TOTAL) BY MOUTH DAILY. 10/30/15  Yes Gwenlyn Found Copland, MD  baclofen (LIORESAL) 10 MG tablet TAKE 1 TABLET EVERY 8 HOURS AS NEEDED FOR MUSCLE SPASM(S) 01/12/16  Yes Wallis Bamberg, PA-C  diclofenac (FLECTOR) 1.3 % PTCH Place 1 patch onto the skin 2 (two) times daily as needed (pain). 11/11/15  Yes Gwenlyn Found Copland, MD  docusate sodium 100 MG CAPS Take 100 mg by mouth 2 (two) times daily. 09/16/14  Yes Lanney Gins, PA-C  hydrALAZINE (APRESOLINE) 100 MG tablet Take 1 tablet (100 mg total) by mouth 3 (three) times daily. 02/12/16  Yes Amy D Clegg, NP  ipratropium-albuterol (DUONEB) 0.5-2.5 (3) MG/3ML SOLN Take 3 mLs by nebulization 4 (four) times daily. 12/23/15  Yes Michele Mcalpine, MD  isosorbide mononitrate (IMDUR) 120 MG 24 hr tablet Take 1 tablet (120 mg total) by mouth daily. 02/12/16  Yes Amy D Clegg, NP  metolazone (ZAROXOLYN) 2.5 MG tablet Take 1 tablet (2.5 mg total) by mouth as needed. Tuesday and Friday 02/12/16  Yes Amy D Clegg, NP  Multiple Vitamins-Minerals (MULTIVITAMIN WITH MINERALS) tablet Take 1 tablet by mouth daily.   Yes Historical Provider, MD  omega-3 acid ethyl esters (LOVAZA) 1 G capsule Take 1 g by mouth daily.   Yes Historical Provider, MD  oxyCODONE-acetaminophen (PERCOCET) 10-325 MG tablet Take 1 tablet by mouth every 6 (six) hours as needed for pain. Use with  stool softerner. 02/23/16  Yes Jessica C Copland, MD  polyethylene glycol powder (GLYCOLAX/MIRALAX) powder MIX 1 CAPFUL (17GM)  IN  LIQUID  AND  DRINK EVERY DAY AS DIRECTED 07/31/15  Yes Gwenlyn Found Copland, MD  potassium chloride SA (K-DUR,KLOR-CON) 20 MEQ tablet Take 2 tablets (40 mEq total) by mouth 2 (two) times daily. 02/23/16  Yes Graciella Freer, PA-C  pregabalin (LYRICA) 75 MG capsule Take 75 mg by mouth 2 (two) times daily.   Yes Historical Provider, MD  tiotropium (SPIRIVA HANDIHALER) 18 MCG inhalation capsule Place 1 capsule (18 mcg total) into inhaler and inhale daily. 09/29/15  Yes Lonzo Cloud  Kriste Basque, MD  torsemide (DEMADEX) 20 MG tablet Take 5 tablets (100 mg total) by mouth 2 (two) times daily. 02/12/16  Yes Amy D Clegg, NP  warfarin (COUMADIN) 10 MG tablet Take 10 mg daily until follow up Patient taking differently: Take 5-10 mg by mouth See admin instructions. Take 0.5 tablet on Tuesday, Wednesday, Thursday then take 1 tablet all the other days 02/12/16  Yes Amy D Clegg, NP  DOBUTamine (DOBUTREX) 4-5 MG/ML-% infusion Inject 190.4 mcg/min into the vein continuous. From [facility] pharmacy x 12 months Patient not taking: Reported on 02/27/2016 02/12/16   Sherald Hess, NP   Physical Exam: Filed Vitals:   02/27/16 0500 02/27/16 0515  BP: 132/77 114/71  Pulse: 90 85  Temp:    Resp: 11 14    BP 114/71 mmHg  Pulse 85  Temp(Src) 97.1 F (36.2 C) (Axillary)  Resp 14  Ht  (1.778 m)  Wt 94.802 kg (209 lb)  BMI 29.99 kg/m2  SpO2 97%  General Appearance:    Alert, oriented, no distress, appears stated age  Head:    Normocephalic, atraumatic  Eyes:    PERRL, EOMI, sclera non-icteric        Nose:   Nares without drainage or epistaxis. Mucosa, turbinates normal  Throat:   Moist mucous membranes. Oropharynx without erythema or exudate.  Neck:   Supple. No carotid bruits.  No thyromegaly.  No lymphadenopathy.   Back:     No CVA tenderness, no spinal tenderness  Lungs:     Clear to auscultation  bilaterally, without wheezes, rhonchi or rales  Chest wall:    No tenderness to palpitation  Heart:    Regular rate and rhythm without murmurs, gallops, rubs  Abdomen:     Soft, non-tender, nondistended, normal bowel sounds, no organomegaly  Genitalia:    deferred  Rectal:    deferred  Extremities:   No clubbing, cyanosis or edema.  Pulses:   2+ and symmetric all extremities  Skin:   Skin color, texture, turgor normal, no rashes or lesions  Lymph nodes:   Cervical, supraclavicular, and axillary nodes normal  Neurologic:   CNII-XII intact. Normal strength, sensation and reflexes      throughout    Labs on Admission:  Basic Metabolic Panel:  Recent Labs Lab 02/27/16 0258  NA 137  K 2.9*  CL 88*  CO2 30  GLUCOSE 91  BUN 52*  CREATININE 2.22*  CALCIUM 10.3   Liver Function Tests: No results for input(s): AST, ALT, ALKPHOS, BILITOT, PROT, ALBUMIN in the last 168 hours. No results for input(s): LIPASE, AMYLASE in the last 168 hours. No results for input(s): AMMONIA in the last 168 hours. CBC:  Recent Labs Lab 02/27/16 0258  WBC 7.3  HGB 12.7*  HCT 40.1  MCV 84.4  PLT 337   Cardiac Enzymes:  Recent Labs Lab 02/27/16 0258  TROPONINI 0.14*    BNP (last 3 results) No results for input(s): PROBNP in the last 8760 hours. CBG: No results for input(s): GLUCAP in the last 168 hours.  Radiological Exams on Admission: Dg Chest Portable 1 View  02/27/2016  CLINICAL DATA:  Dyspnea, 4 or 5 hours duration. Nonproductive cough. EXAM: PORTABLE CHEST 1 VIEW COMPARISON:  02/09/2016 FINDINGS: Is unchanged moderate cardiomegaly. No focal airspace consolidation. No large effusion. No pneumothorax. IMPRESSION: Cardiomegaly.  No focal consolidation or large effusion. Electronically Signed   By: Ellery Plunk M.D.   On: 02/27/2016 03:19    EKG: Independently reviewed.  Assessment/Plan Principal Problem:   Acute on chronic systolic CHF (congestive heart failure), NYHA class 4  (HCC) Active Problems:   Chronic anticoagulation   Type 2 diabetes mellitus with stage 4 chronic kidney disease, without long-term current use of insulin (HCC)   CKD (chronic kidney disease), stage IV (HCC)   1. Acute on chronic systolic CHF NYHA class 4 - 1. I believe the patient has demonstrated that the dobutamine gtt was in-fact helping, and that his symptoms are far more severe without it. 2. Spoke with Dr. Leeann Must with cards: 1. Resume dobutamine gtt 2. PICC line ordered 3. He will pass information on to heart failure team who will see the patient 3. CHF pathway 4. Continue home meds 5. Got lasix in ED, but patient really isnt fluid overloaded based on CXR 2. Chronic anticoagulation - coumadin per pharm consult 3. CKD stage 4 - chronic and baseline, daily BMP as per CHF pathway 4. DM2 - appears diet controlled at this time  Cardiology heart failure team consult as above  Code Status: Full code - see also pal care consult from 3/21  Family Communication: No family in room Disposition Plan: Admit to inpatient   Time spent: 70 min  Natividad Halls M. [facility] Pager [phone]  If 7AM-7PM, please contact the day team taking care of the patient [email or web address] Password [facility] 02/27/2016, 6:32 AM

## 2016-02-27 NOTE — ED Notes (Signed)
MD at bedside. 

## 2016-02-27 NOTE — ED Notes (Signed)
Pt states he does not want to restart dobutamine drip. Will contact MD.

## 2016-02-27 NOTE — ED Notes (Signed)
Xray notified of portable chest order

## 2016-02-27 NOTE — Consult Note (Signed)
Advanced Heart Failure Team Consult Note  Referring Physician: Dr Mora Bellman (ED) Primary Physician: Dr Patsy Lager Primary Nephrologist: Dr Briant Cedar Primary Cardiologist:  Dr. Gala Romney   Reason for Consultation: Acute on chronic   HPI:    Frank Davidson is a 65 y.o. male with a hx of obesity, PAF, HTN, COPD, HTN, gout, DM 2, CVA and CHF EF 25% due to NICM who presents to [facility] with c/o of worsening SOB and weight gain. Pertinent admission labs include K 3.0 (initial result 5.6 2/2 hemolysis), Creatinine 3.39, BNP 750.3, Troponin flat. CXR with no acute findings, chronic cardiomegaly and lung scarring.   Was recently admitted from 2/21-2/23/17 with volume overload thought to be 2/2 to high salt diet. He diuresed 3 lbs to a discharge weight of 219 lbs. He has a narrow euvolemic window.   Recently admitted with HF and renal failure. Started on dobutamine for low output. Improved on dobutamine. However, he had a long discussion with his PCP Dr. Dallas Schimke and decided he wanted to be DNR and stop dobutamine. Yesterday at 3 pm the infusion was stopped. At around 7pm he began to develop severe SOB, DOE, orthopnea. Patient is brought in to the ED.terday at 3 pm the infusion was stopped. At around 10 am he began to develop severe SOB, DOE, orthopnea and came to the ER. BNP 864 (up from 400). Renal function stable. CXR ok. Got lasix and improved.  Initially wanted to restart dobutamine. Now refusing. Weight down 5 pounds from last week   Review of Systems: [y] = yes,  = no   General: Weight gain ; Weight loss ; Anorexia ; Fatigue ; Fever ; Chills ; Weakness   Cardiac: Chest pain/pressure ; Resting SOB ; Exertional SOB [y]; Orthopnea [place] ]; Pedal Edema [y]; Palpitations ; Syncope ; Presyncope ; Paroxysmal nocturnal dyspnea[ ]   Pulmonary: Cough ; Wheezing[ ] ; Hemoptysis[ ] ; Sputum ; Snoring   GI: Vomiting[ ] ; Dysphagia[ ] ; Melena[ ] ; Hematochezia ; Heartburn[ ] ;  Abdominal pain ; Constipation ; Diarrhea ; BRBPR   GU: Hematuria[ ] ; Dysuria ; Nocturia[ ]   Vascular: Pain in legs with walking ; Pain in feet with lying flat ; Non-healing sores ; Stroke ; TIA ; Slurred speech ;  Neuro: Headaches[ ] ; Vertigo[ ] ; Seizures[ ] ; Paresthesias[ ] ;Blurred vision ; Diplopia ; Vision changes   Ortho/Skin: Arthritis [y]; Joint pain [y]; Muscle pain ; Joint swelling ; Back Pain ; Rash   Psych: Depression[ ] ; Anxiety[ ]   Heme: Bleeding problems ; Clotting disorders ; Anemia   Endocrine: Diabetes ; Thyroid dysfunction[ ]   Home Medications Prior to Admission medications   Medication Sig Start Date End Date Taking? Authorizing Provider  albuterol (PROVENTIL HFA;VENTOLIN HFA) 108 (90 BASE) MCG/ACT inhaler Inhale 2 puffs into the lungs every 6 (six) hours as needed for wheezing or shortness of breath. 06/30/15  Yes Gwenlyn Found Copland, MD  albuterol (PROVENTIL) (2.5 MG/3ML) 0.083% nebulizer solution Take 3 mLs (2.5 mg total) by nebulization every 4 (four) hours as needed for wheezing or shortness of breath. 11/26/15  Yes Carmelina Dane, MD  allopurinol (ZYLOPRIM) 100 MG tablet TAKE 1 TABLET (100 MG TOTAL) BY MOUTH DAILY. 10/30/15  Yes Gwenlyn Found Copland, MD  apixaban (ELIQUIS) 5 MG TABS tablet Take 1 tablet (5 mg  total) by mouth 2 (two) times daily. 02/04/15  Yes Dolores Patty, MD  baclofen (LIORESAL) 10 MG tablet TAKE 1 TABLET EVERY 8 HOURS AS NEEDED FOR MUSCLE SPASM(S) 01/12/16  Yes Wallis Bamberg, PA-C  carvedilol (COREG) 12.5 MG tablet Take 12.5 mg by mouth 2 (two) times daily with a meal.   Yes Historical Provider, MD  diclofenac (FLECTOR) 1.3 % PTCH Place 1 patch onto the skin 2 (two) times daily as needed (pain). 11/11/15  Yes Gwenlyn Found Copland, MD  docusate sodium 100 MG CAPS Take 100 mg by mouth 2 (two) times daily. 09/16/14  Yes Matthew Babish, PA-C  glipiZIDE (GLUCOTROL) 5 MG tablet Take 5 mg by mouth 2  (two) times daily before a meal.   Yes Historical Provider, MD  hydrALAZINE (APRESOLINE) 50 MG tablet Take 1 tablet (50 mg total) by mouth 3 (three) times daily. 09/15/15  Yes Laurey Morale, MD  ipratropium-albuterol (DUONEB) 0.5-2.5 (3) MG/3ML SOLN Take 3 mLs by nebulization 4 (four) times daily. 12/23/15  Yes Michele Mcalpine, MD  isosorbide mononitrate (IMDUR) 60 MG 24 hr tablet Take 60 mg by mouth daily.   Yes Historical Provider, MD  metolazone (ZAROXOLYN) 2.5 MG tablet Take 1 tablet (2.5 mg total) by mouth 2 (two) times a week. Tuesday and Friday 09/29/15  Yes Dolores Patty, MD  Multiple Vitamins-Minerals (MULTIVITAMIN WITH MINERALS) tablet Take 1 tablet by mouth daily.   Yes Historical Provider, MD  omega-3 acid ethyl esters (LOVAZA) 1 G capsule Take 1 g by mouth daily.   Yes Historical Provider, MD  oxyCODONE-acetaminophen (PERCOCET) 10-325 MG tablet Take 1 tablet by mouth every 8 (eight) hours as needed for pain. Use with stool softerner. 01/13/16  Yes Sarah Harvie Bridge, PA-C  polyethylene glycol powder (GLYCOLAX/MIRALAX) powder MIX 1 CAPFUL (17GM)  IN  LIQUID  AND  DRINK EVERY DAY AS DIRECTED 07/31/15  Yes Gwenlyn Found Copland, MD  potassium chloride SA (K-DUR,KLOR-CON) 20 MEQ tablet Take 2 tablets (40 mEq total) by mouth 2 (two) times daily. Take 2 tabs in AM and 1 tab in PM 01/15/16  Yes Amy D Clegg, NP  pregabalin (LYRICA) 75 MG capsule Take 75 mg by mouth 2 (two) times daily.   Yes Historical Provider, MD  tiotropium (SPIRIVA HANDIHALER) 18 MCG inhalation capsule Place 1 capsule (18 mcg total) into inhaler and inhale daily. 09/29/15  Yes Michele Mcalpine, MD  torsemide (DEMADEX) 20 MG tablet TAKE 5  TABLETS (100 MG) IN AM AND 4 TABLETS IN PM. 01/15/16  Yes Amy Georgie Chard, NP    Past Medical History: Past Medical History  Diagnosis Date  . Atrial fibrillation (HCC)     PAF  . Hypertension   . COPD (chronic obstructive pulmonary disease) (HCC)   . Ocular herpes   . Gout   . CHF (congestive heart  failure) (HCC)   . Chronic pain   . Hyperlipidemia   . Nonischemic cardiomyopathy (HCC) Aug 2015    EF 15%  . Ocular herpes   . Neuropathy (HCC)   . Left-sided weakness     "because of the arthritis and edema"  . PVD (peripheral vascular disease) [facility]) May 2015    abnormal ABIs  . Memory impairment     "short term"  . CKD (chronic kidney disease), stage III     Dr. Briant Cedar follows-holding   . Fibromyalgia     neuropathy- hands, more than feet.  . Sleep apnea with use of continuous positive airway  pressure (CPAP)     does not use cpap  . GERD (gastroesophageal reflux disease)     tums as needed  . Asthma   . Type II diabetes mellitus (HCC)   . Stroke [facility]) X 5    last stroke aug 2014"; denies residual on 09/12/2014  . Osteoarthritis   . Arthritis     "knees, right elbow, shoulder" (09/12/2014)  . Shortness of breath dyspnea     Past Surgical History: Past Surgical History  Procedure Laterality Date  . 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 Tonsillectomy  age 38  . Esophagogastroduodenoscopy N/A 12/24/2013    Procedure: ESOPHAGOGASTRODUODENOSCOPY (EGD);  Surgeon: Louis Meckel, MD;  Location: [facility];  Service: Endoscopy;  Laterality: N/A;  . Colonoscopy N/A 12/24/2013    Procedure: COLONOSCOPY;  Surgeon: Louis Meckel, MD;  Location: [facility] ENDOSCOPY;  Service: Endoscopy;  Laterality: N/A;  . Joint replacement    . Cardiac catheterization  10/2006    normal coronary arteries;   Marland Kitchen Cataract extraction w/ intraocular lens  implant, bilateral Bilateral ~ 2008  . Total hip arthroplasty Left 09/12/2014    Procedure: LEFT TOTAL HIP ARTHROPLASTY ANTERIOR APPROACH;  Surgeon: [name], MD;  Location: [facility] OR;  Service: Orthopedics;  Laterality: Left;  . Right heart catheterization N/A 08/13/2014    Rt ht cath prior to hip surgery  . Cardiac catheterization N/A 02/06/2016    Procedure: Right Heart Cath;  Surgeon: Dolores Patty, MD;  Location: [facility] INVASIVE CV LAB;  Service:  Cardiovascular;  Laterality: N/A;    Family History: Family History  Problem Relation Age of Onset  . Cancer Mother   . Hypertension Mother   . Lung disease Mother   . Diabetes Father   . Diabetes Sister   . Hypertension Sister   . Hypertension Brother   . Hypertension Sister   . Colon cancer Neg Hx     Social History: Social History   Social History  . Marital Status: Divorced    Spouse Name: N/A  . Number of Children: N/A  . Years of Education: N/A   Social History Main Topics  . Smoking status: Former Smoker -- 0.50 packs/day for 13 years    Types: Cigarettes    Quit date: 12/18/2006  . Smokeless tobacco: Never Used  . Alcohol Use: No     Comment: Quit 2008  . Drug Use: No     Comment: last used marijuana and cocaine 2008  . Sexual Activity: No   Other Topics Concern  . None   Social History Narrative    Allergies:  Allergies  Allergen Reactions  . Corlanor [Ivabradine] Palpitations and Other (See Comments)    Kidney and heart problems, syncope  . Entresto [Sacubitril-Valsartan]     Syncope  . Januvia [Sitagliptin] Other (See Comments)    "Almost killed me"   . Lunesta [Eszopiclone] Palpitations and Other (See Comments)    Kidney and heart problems, syncope  . Actos [Pioglitazone] Other (See Comments)    Caused blood in urine and fluid retention     Objective:    Vital Signs:   Temp:  [97.1 F (36.2 C)] 97.1 F (36.2 C) (04/07 0255) Pulse Rate:  [44-110] 93 (04/07 1300) Resp:  [9-22] 14 (04/07 1300) BP: (96-155)/(48-132) 128/72 mmHg (04/07 1300) SpO2:  [92 %-100 %] 99 % (04/07 1300) Weight:  [94.802 kg (209 lb)] 94.802 kg (209 lb) (04/07 0249)    Weight change:  Filed Weights   02/27/16 0249  Weight: 94.802 kg (209 lb)    Intake/Output:   Intake/Output Summary (Last 24 hours) at 02/27/16 1403 Last data filed at 02/27/16 0738  Gross per 24 hour  Intake      0 ml  Output    600 ml  Net   -600 ml     Physical Exam: General:   Obese, pleasant HEENT: normal Neck: supple. JVP 6-7. Carotids 2+ bilat; no bruits. No thyromegaly or nodule noted. Cor: PMI nondisplaced. irregularly irregular.  Lungs: Mild basilar crackles, occasional wheeze. Abdomen: Obese, soft, NT, non distended, no HSM. No bruits or masses. +BS  Extremities: no cyanosis, clubbing, rash, edema.  Neuro: alert & orientedx3, cranial nerves grossly intact. moves all 4 extremities w/o difficulty. Affect pleasant  Telemetry: Reviewed, afib  Labs: Basic Metabolic Panel:  Recent Labs Lab 02/27/16 0258  NA 137  K 2.9*  CL 88*  CO2 30  GLUCOSE 91  BUN 52*  CREATININE 2.22*  CALCIUM 10.3    Liver Function Tests: No results for input(s): AST, ALT, ALKPHOS, BILITOT, PROT, ALBUMIN in the last 168 hours. No results for input(s): LIPASE, AMYLASE in the last 168 hours. No results for input(s): AMMONIA in the last 168 hours.  CBC:  Recent Labs Lab 02/27/16 0258  WBC 7.3  HGB 12.7*  HCT 40.1  MCV 84.4  PLT 337    Cardiac Enzymes:  Recent Labs Lab 02/27/16 0258  TROPONINI 0.14*    BNP: BNP (last 3 results)  Recent Labs  02/03/16 0115 02/19/16 1100 02/27/16 0258  BNP 750.3* 400.3* 864.7*    ProBNP (last 3 results) No results for input(s): PROBNP in the last 8760 hours.   CBG: No results for input(s): GLUCAP in the last 168 hours.  Coagulation Studies:  Recent Labs  02/27/16 0258  LABPROT 34.9*  INR 3.57*    Other results: EKG: 02/02/16 afib 72  Imaging: Dg Chest Portable 1 View  02/27/2016  CLINICAL DATA:  Dyspnea, 4 or 5 hours duration. Nonproductive cough. EXAM: PORTABLE CHEST 1 VIEW COMPARISON:  02/09/2016 FINDINGS: Is unchanged moderate cardiomegaly. No focal airspace consolidation. No large effusion. No pneumothorax. IMPRESSION: Cardiomegaly.  No focal consolidation or large effusion. Electronically Signed   By: Ellery Plunk M.D.   On: 02/27/2016 03:19     Medications:     Current Medications: .  allopurinol  100 mg Oral Daily  . docusate sodium  100 mg Oral BID  . hydrALAZINE  100 mg Oral TID  . ipratropium-albuterol  3 mL Nebulization QID  . isosorbide mononitrate  120 mg Oral Daily  . multivitamin with minerals  1 tablet Oral Daily  . omega-3 acid ethyl esters  1 g Oral Daily  . polyethylene glycol  17 g Oral Daily  . potassium chloride SA  40 mEq Oral BID  . pregabalin  75 mg Oral BID  . sodium chloride flush  3 mL Intravenous Q12H  . tiotropium  18 mcg Inhalation Daily  . torsemide  100 mg Oral BID  . warfarin  2 mg Oral ONCE-1800  . Warfarin - Pharmacist Dosing Inpatient   Does not apply q1800    Infusions: . sodium chloride    . DOBUTamine    . potassium chloride 10 mEq (02/27/16 1249)  . potassium chloride       Assessment   1. Acute respiratory failure with hypoxia 2. Acute on chronic systolic CHF, end-stage 3. Chronic renal failure stage IV 4. Chronic  Afib 5. HTN 6. DM2 7. OSA -  8/ DNR/DNI  Plan    He has end-stage HF and was recently discharged on dobutamine for palliative purposes. He doesn't feel that dobutamine helped him much and he wants to stop it. He realizes that stopping dobutamine is one of there reasons he probably feels worse. We discussed Hospice and he wants to proceed He understands his time is limited. Volume status currently looks good. Can resume po diuretics. He is DNR/DNI.   Length of Stay: 0  Arvilla Meres MD 02/27/2016, 2:03 PM  Advanced Heart Failure Team Pager [phone] (M-F; 7a - 4p)  Please contact [facility] for night-coverage after hours (4p -7a ) and weekends on [email or web address]

## 2016-02-27 NOTE — Progress Notes (Signed)
Physician notified: Frank Davidson At: 23  Regarding: FYI pt in Afib 90-100 with unsustained bursts of RVR at rate of 130-150s.

## 2016-02-27 NOTE — ED Notes (Signed)
Attempted to walk pt however he was unable to get out of bed as his breathing became very labored.

## 2016-02-27 NOTE — ED Notes (Signed)
Pharmacist called to verify pt still needs 2 10 MEq of potassium IV.

## 2016-02-28 DIAGNOSIS — I482 Chronic atrial fibrillation: Secondary | ICD-10-CM

## 2016-02-28 DIAGNOSIS — Z7901 Long term (current) use of anticoagulants: Secondary | ICD-10-CM

## 2016-02-28 DIAGNOSIS — E1122 Type 2 diabetes mellitus with diabetic chronic kidney disease: Secondary | ICD-10-CM

## 2016-02-28 DIAGNOSIS — Z515 Encounter for palliative care: Secondary | ICD-10-CM | POA: Insufficient documentation

## 2016-02-28 LAB — BASIC METABOLIC PANEL
Anion gap: 15 (ref 5–15)
BUN: 47 mg/dL — AB (ref 6–20)
CHLORIDE: 90 mmol/L — AB (ref 101–111)
CO2: 30 mmol/L (ref 22–32)
Calcium: 9.5 mg/dL (ref 8.9–10.3)
Creatinine, Ser: 2.24 mg/dL — ABNORMAL HIGH (ref 0.61–1.24)
GFR, EST AFRICAN AMERICAN: 34 mL/min — AB (ref >60)
GFR, EST NON AFRICAN AMERICAN: 29 mL/min — AB (ref >60)
Glucose, Bld: 141 mg/dL — ABNORMAL HIGH (ref 65–99)
POTASSIUM: 3.4 mmol/L — AB (ref 3.5–5.1)
SODIUM: 135 mmol/L (ref 135–145)

## 2016-02-28 LAB — GLUCOSE, CAPILLARY
GLUCOSE-CAPILLARY: 176 mg/dL — AB (ref 65–99)
GLUCOSE-CAPILLARY: 175 mg/dL — AB (ref 65–99)
Glucose-Capillary: 125 mg/dL — ABNORMAL HIGH (ref 65–99)
Glucose-Capillary: 136 mg/dL — ABNORMAL HIGH (ref 65–99)

## 2016-02-28 LAB — PROTIME-INR
INR: 3.7 — ABNORMAL HIGH (ref 0.00–1.49)
PROTHROMBIN TIME: 35.8 s — AB (ref 11.6–15.2)

## 2016-02-28 MED ORDER — SENNA 8.6 MG PO TABS
2.0000 | ORAL_TABLET | Freq: Every evening | ORAL | Status: DC | PRN
  Administered 2016-02-28: 17.2 mg via ORAL
  Filled 2016-02-28: qty 2

## 2016-02-28 MED ORDER — AMIODARONE HCL IN DEXTROSE 360-4.14 MG/200ML-% IV SOLN
30.0000 mg/h | INTRAVENOUS | Status: DC
  Administered 2016-02-28 – 2016-03-02 (×6): 30 mg/h via INTRAVENOUS
  Filled 2016-02-28 (×7): qty 200

## 2016-02-28 MED ORDER — AMIODARONE HCL IN DEXTROSE 360-4.14 MG/200ML-% IV SOLN
60.0000 mg/h | INTRAVENOUS | Status: AC
  Administered 2016-02-28 (×2): 60 mg/h via INTRAVENOUS
  Filled 2016-02-28: qty 200

## 2016-02-28 MED ORDER — MORPHINE SULFATE (PF) 2 MG/ML IV SOLN: 2.0000 mg | Freq: Once | INTRAVENOUS | Status: DC

## 2016-02-28 MED ORDER — OXYCODONE HCL 5 MG PO TABS
5.0000 mg | ORAL_TABLET | ORAL | Status: DC | PRN
  Administered 2016-02-28 (×2): 10 mg via ORAL
  Administered 2016-02-29 – 2016-03-01 (×4): 5 mg via ORAL
  Administered 2016-03-01 (×2): 10 mg via ORAL
  Filled 2016-02-28: qty 1
  Filled 2016-02-28: qty 2
  Filled 2016-02-28 (×2): qty 1
  Filled 2016-02-28 (×3): qty 2
  Filled 2016-02-28: qty 1

## 2016-02-28 MED ORDER — AMIODARONE LOAD VIA INFUSION
150.0000 mg | Freq: Once | INTRAVENOUS | Status: AC
  Administered 2016-02-28: 150 mg via INTRAVENOUS
  Filled 2016-02-28: qty 83.34

## 2016-02-28 MED ORDER — WHITE PETROLATUM GEL
Status: AC
  Administered 2016-02-28: 18:00:00
  Filled 2016-02-28: qty 1

## 2016-02-28 MED ORDER — FUROSEMIDE 10 MG/ML IJ SOLN
80.0000 mg | Freq: Once | INTRAMUSCULAR | Status: AC
  Administered 2016-02-28: 80 mg via INTRAVENOUS
  Filled 2016-02-28: qty 8

## 2016-02-28 NOTE — Significant Event (Signed)
See Dr. Prescott Gum note for more details. Increasing SOB overnight, HR still elevated with amiodarone. Patient feels more SOB, like he needs diuresis.  I/O total for the day + 3 L.  Will dose with additional 80 IV lasix now.

## 2016-02-28 NOTE — Progress Notes (Signed)
Hope to d/c later today home with hospice Marlin Canary DO

## 2016-02-28 NOTE — Care Management Note (Signed)
Case Management Note  Patient Details  Name: Frank Davidson MRN: [id number] Date of Birth: [birth year]  Subjective/Objective:                  SOB Action/Plan: Discharge planning Expected Discharge Date:  02/28/16              Expected Discharge Plan:  Home w Hospice Care  In-House Referral:     Discharge planning Services  CM Consult  Post Acute Care Choice:    Choice offered to:  Patient  DME Arranged:    DME Agency:  NA  HH Arranged:  NA HH Agency:  Other - See comment  Status of Service:  Completed, signed off  Medicare Important Message Given:    Date Medicare IM Given:    Medicare IM give by:    Date Additional Medicare IM Given:    Additional Medicare Important Message give by:     If discussed at [place] of Stay Meetings, dates discussed:    Additional Comments: CM met with pt in room to offer choice of home hospice agency.  Pt chooses [facility]).  Referral called to [facility] rep, Bambi.  Bambi confirms sublingual lasix prn is "not a problem."  Pt declines need for DME such as hospital bed, walker, 3n1, tray table, home O2.  Pt states he lives with niece Frank Davidson [phone].  No other CM needs were communicated. Dellie Catholic, RN 02/28/2016, 1:29 PM

## 2016-02-28 NOTE — Progress Notes (Signed)
PROGRESS NOTE  DELDRICK YOUTZ VQM:[id number] DOB: October 05, 1951 DOA: 02/27/2016 PCP: Abbe Amsterdam, MD  Assessment/Plan: Acute on chronic systolic CHF NYHA class 4 - -refusing dobutamine -hospice at home -PRN SQ lasix  Chronic anticoagulation - coumadin per pharm consult  CKD stage 4 - stable  DM2 - appears diet controlled at this time  Atrial fibrillation -fast this afternoon -cards added amio   Code Status: dnr Family Communication: no family at bedside Disposition Plan:    Consultants:  chf  Procedures:      HPI/Subjective: Wants to go home with hopsice  Objective: Filed Vitals:   02/28/16 1255 02/28/16 1300  BP: 126/97 111/94  Pulse: 80 152  Temp: 98.1 F (36.7 C)   Resp: 23 17    Intake/Output Summary (Last 24 hours) at 02/28/16 1311 Last data filed at 02/28/16 1253  Gross per 24 hour  Intake 728.34 ml  Output   1000 ml  Net -271.66 ml   Filed Weights   02/27/16 0249 02/27/16 1500 02/28/16 0345  Weight: 94.802 kg (209 lb) 95.8 kg (211 lb 3.2 oz) 96.2 kg (212 lb 1.3 oz)    Exam:   General:  Awake, NAD  Cardiovascular: about 110s, irr  Respiratory: diminished  Abdomen: obese  Musculoskeletal: +edema   Data Reviewed: Basic Metabolic Panel:  Recent Labs Lab 02/27/16 0258 02/28/16 0338  NA 137 135  K 2.9* 3.4*  CL 88* 90*  CO2 30 30  GLUCOSE 91 141*  BUN 52* 47*  CREATININE 2.22* 2.24*  CALCIUM 10.3 9.5   Liver Function Tests: No results for input(s): AST, ALT, ALKPHOS, BILITOT, PROT, ALBUMIN in the last 168 hours. No results for input(s): LIPASE, AMYLASE in the last 168 hours. No results for input(s): AMMONIA in the last 168 hours. CBC:  Recent Labs Lab 02/27/16 0258  WBC 7.3  HGB 12.7*  HCT 40.1  MCV 84.4  PLT 337   Cardiac Enzymes:  Recent Labs Lab 02/27/16 0258  TROPONINI 0.14*   BNP (last 3 results)  Recent Labs  02/03/16 0115 02/19/16 1100 02/27/16 0258  BNP 750.3* 400.3* 864.7*    ProBNP  (last 3 results) No results for input(s): PROBNP in the last 8760 hours.  CBG:  Recent Labs Lab 02/27/16 1735 02/27/16 2315 02/28/16 0824 02/28/16 1250  GLUCAP 134* 133* 176* 175*    No results found for this or any previous visit (from the past 240 hour(s)).   Studies: Dg Chest Portable 1 View  02/27/2016  CLINICAL DATA:  Dyspnea, 4 or 5 hours duration. Nonproductive cough. EXAM: PORTABLE CHEST 1 VIEW COMPARISON:  02/09/2016 FINDINGS: Is unchanged moderate cardiomegaly. No focal airspace consolidation. No large effusion. No pneumothorax. IMPRESSION: Cardiomegaly.  No focal consolidation or large effusion. Electronically Signed   By: Ellery Plunk M.D.   On: 02/27/2016 03:19    Scheduled Meds: . allopurinol  100 mg Oral Daily  . docusate sodium  100 mg Oral BID  . hydrALAZINE  100 mg Oral TID  . ipratropium-albuterol  3 mL Nebulization TID  . isosorbide mononitrate  120 mg Oral Daily  . multivitamin with minerals  1 tablet Oral Daily  . omega-3 acid ethyl esters  1 g Oral Daily  . polyethylene glycol  17 g Oral Daily  . potassium chloride SA  40 mEq Oral BID  . pregabalin  75 mg Oral BID  . sodium chloride flush  3 mL Intravenous Q12H  . tiotropium  18 mcg Inhalation Daily  . torsemide  100  mg Oral BID  . Warfarin - Pharmacist Dosing Inpatient   Does not apply q1800   Continuous Infusions: . amiodarone 60 mg/hr (02/28/16 1253)   Followed by  . amiodarone     Antibiotics Given (last 72 hours)    None      Principal Problem:   Acute on chronic systolic CHF (congestive heart failure), NYHA class 4 (HCC) Active Problems:   Chronic anticoagulation   Type 2 diabetes mellitus with stage 4 chronic kidney disease, without long-term current use of insulin (HCC)   CKD (chronic kidney disease), stage IV (HCC)    Time spent: 25 min    Skilynn Durney [facility]  [facility] Pager [phone] If 7PM-7AM, please contact night-coverage at [email or web address], password  [facility] 02/28/2016, 1:11 PM  LOS: 1 day

## 2016-02-28 NOTE — Progress Notes (Addendum)
ANTICOAGULATION CONSULT NOTE - Initial Consult  Pharmacy Consult for Coumadin Indication: PAF and h/o CVA  Allergies  Allergen Reactions  . Corlanor [Ivabradine] Palpitations and Other (See Comments)    Kidney and heart problems, syncope  . Entresto [Sacubitril-Valsartan]     Syncope  . Januvia [Sitagliptin] Other (See Comments)    "Almost killed me"   . Lunesta [Eszopiclone] Palpitations and Other (See Comments)    Kidney and heart problems, syncope  . Actos [Pioglitazone] Other (See Comments)    Caused blood in urine and fluid retention     Patient Measurements: Height: 5\' 10"  (177.8 cm) Weight: 212 lb 1.3 oz (96.2 kg) IBW/kg (Calculated) : 73  Vital Signs: Temp: 97.3 F (36.3 C) (04/08 0807) Temp Source: Oral (04/08 0807) BP: 111/72 mmHg (04/08 0807) Pulse Rate: 108 (04/08 0807)  Labs:  Recent Labs  02/27/16 0258 02/28/16 0338  HGB 12.7*  --   HCT 40.1  --   PLT 337  --   LABPROT 34.9* 35.8*  INR 3.57* 3.70*  CREATININE 2.22* 2.24*  TROPONINI 0.14*  --     Estimated Creatinine Clearance: 38.3 mL/min (by C-G formula based on Cr of 2.24).   Medical History: Past Medical History  Diagnosis Date  . Atrial fibrillation (HCC)     PAF  . Hypertension   . COPD (chronic obstructive pulmonary disease) (HCC)   . Ocular herpes   . Gout   . CHF (congestive heart failure) (HCC)   . Chronic pain   . Hyperlipidemia   . Nonischemic cardiomyopathy (HCC) Aug 2015    EF 15%  . Ocular herpes   . Neuropathy (HCC)   . Left-sided weakness     "because of the arthritis and edema"  . PVD (peripheral vascular disease) [facility]) May 2015    abnormal ABIs  . Memory impairment     "short term"  . CKD (chronic kidney disease), stage III     Dr. Briant Cedar follows-holding   . Fibromyalgia     neuropathy- hands, more than feet.  . Sleep apnea with use of continuous positive airway pressure (CPAP)     does not use cpap  . GERD (gastroesophageal reflux disease)     tums as  needed  . Asthma   . Type II diabetes mellitus (HCC)   . Stroke [facility]) X 5    last stroke aug 2014"; denies residual on 09/12/2014  . Osteoarthritis   . Arthritis     "knees, right elbow, shoulder" (09/12/2014)  . Shortness of breath dyspnea     Assessment: 65yo male c/o worsening SOB over ~12hr, eventually also experienced generalized weakness, admitted for possible CHF exacerbation, to continue Coumadin for PAF and h/o CVA; current INR above goal w/ last dose of Coumadin taken 4/6; of note pt was seen at anti-coag clinic on 4/4 when INR was 5.2, instructed to skip two days and decrease weekly dose by 5mg  (was 60mg  weekly, changed to 55mg  weekly) so INR may still be on its way down.  PTA dose = 10 mg daily except 5 mg T/Th/Sat  DDI - IV Amiodarone added today   INR supratherapeutic at 3.70. Hgb 12.7, Plts 337   Goal of Therapy:  INR 2-3   Plan:  Will hold dose today x 1 Daily INR, CBC q3 days Monitor for s/sx of bleeding   Hazle Nordmann, PharmD Pharmacy Resident [phone]

## 2016-02-28 NOTE — Progress Notes (Signed)
Advanced Heart Failure Rounding Note   Subjective:     Denies SOB or bloating. Eager to go home.  His AF is now fast 120-140   Objective:   Weight Range:  Vital Signs:   Temp:  [97.3 F (36.3 C)-98.8 F (37.1 C)] 97.3 F (36.3 C) (04/08 0807) Pulse Rate:  [68-113] 108 (04/08 0807) Resp:  [11-28] 20 (04/08 0807) BP: (101-128)/(72-88) 111/72 mmHg (04/08 0807) SpO2:  [92 %-99 %] 92 % (04/08 0807) Weight:  [95.8 kg (211 lb 3.2 oz)-96.2 kg (212 lb 1.3 oz)] 96.2 kg (212 lb 1.3 oz) (04/08 0345) Last BM Date: 02/27/16  Weight change: Filed Weights   02/27/16 0249 02/27/16 1500 02/28/16 0345  Weight: 94.802 kg (209 lb) 95.8 kg (211 lb 3.2 oz) 96.2 kg (212 lb 1.3 oz)    Intake/Output:   Intake/Output Summary (Last 24 hours) at 02/28/16 1223 Last data filed at 02/28/16 1100  Gross per 24 hour  Intake    480 ml  Output   1000 ml  Net   -520 ml     Physical Exam: General: Obese, pleasant HEENT: normal Neck: supple. JVP 7-8. Carotids 2+ bilat; no bruits. No thyromegaly or nodule noted. Cor: PMI nondisplaced. irregularly irregular. tachy Lungs: clear Abdomen: Obese, soft, NT, non distended, no HSM. No bruits or masses. +BS  Extremities: no cyanosis, clubbing, rash, edema.  Neuro: alert & orientedx3, cranial nerves grossly intact. moves all 4 extremities w/o difficulty. Affect pleasant  Telemetry:  AF maintaining 120-140 (reviewed personally)  Labs: Basic Metabolic Panel:  Recent Labs Lab 02/27/16 0258 02/28/16 0338  NA 137 135  K 2.9* 3.4*  CL 88* 90*  CO2 30 30  GLUCOSE 91 141*  BUN 52* 47*  CREATININE 2.22* 2.24*  CALCIUM 10.3 9.5    Liver Function Tests: No results for input(s): AST, ALT, ALKPHOS, BILITOT, PROT, ALBUMIN in the last 168 hours. No results for input(s): LIPASE, AMYLASE in the last 168 hours. No results for input(s): AMMONIA in the last 168 hours.  CBC:  Recent Labs Lab 02/27/16 0258  WBC 7.3  HGB 12.7*  HCT 40.1  MCV 84.4    PLT 337    Cardiac Enzymes:  Recent Labs Lab 02/27/16 0258  TROPONINI 0.14*    BNP: BNP (last 3 results)  Recent Labs  02/03/16 0115 02/19/16 1100 02/27/16 0258  BNP 750.3* 400.3* 864.7*    ProBNP (last 3 results) No results for input(s): PROBNP in the last 8760 hours.    Other results:  Imaging: Dg Chest Portable 1 View  02/27/2016  CLINICAL DATA:  Dyspnea, 4 or 5 hours duration. Nonproductive cough. EXAM: PORTABLE CHEST 1 VIEW COMPARISON:  02/09/2016 FINDINGS: Is unchanged moderate cardiomegaly. No focal airspace consolidation. No large effusion. No pneumothorax. IMPRESSION: Cardiomegaly.  No focal consolidation or large effusion. Electronically Signed   By: Ellery Plunk M.D.   On: 02/27/2016 03:19      Medications:     Scheduled Medications: . allopurinol  100 mg Oral Daily  . docusate sodium  100 mg Oral BID  . hydrALAZINE  100 mg Oral TID  . ipratropium-albuterol  3 mL Nebulization TID  . isosorbide mononitrate  120 mg Oral Daily  . multivitamin with minerals  1 tablet Oral Daily  . omega-3 acid ethyl esters  1 g Oral Daily  . polyethylene glycol  17 g Oral Daily  . potassium chloride SA  40 mEq Oral BID  . pregabalin  75 mg Oral BID  .  sodium chloride flush  3 mL Intravenous Q12H  . tiotropium  18 mcg Inhalation Daily  . torsemide  100 mg Oral BID  . Warfarin - Pharmacist Dosing Inpatient   Does not apply q1800     Infusions:     PRN Medications:  sodium chloride, acetaminophen, albuterol, baclofen, diclofenac, metolazone, ondansetron (ZOFRAN) IV, oxyCODONE-acetaminophen **AND** oxyCODONE, sodium chloride flush   Assessment:   1. Acute respiratory failure with hypoxia 2. Acute on chronic systolic CHF, end-stage 3. Chronic renal failure stage IV 4. Chronic Afib now with RVR 5. HTN 6. DM2 7. OSA -  8/ DNR/DNI  Plan/Discussion:     Volume status looks good. Renal function stable. He is clear that he does not want dobutamine. He  would like to go home with Hospice.  Unfortunately HR quite high today and not stable enough for d/c. Will add IV amio and hopefully we can slow him down enough to be discharged soon.     Length of Stay: 1   Bensimhon, Daniel MD 02/28/2016, 12:23 PM  Advanced Heart Failure Team Pager [phone] (M-F; 7a - 4p)  Please contact [facility] for night-coverage after hours (4p -7a ) and weekends on [email or web address]

## 2016-02-28 NOTE — Consult Note (Signed)
Consultation Note Date: 02/28/2016   Patient Name: Frank Davidson  DOB: 09-18-1951  MRN: [id number]  Age / Sex: 65 y.o., male  PCP: Pearline Cables, MD Referring Physician: Joseph Art, DO  Reason for Consultation: Establishing goals of care, Hospice Evaluation, Non pain symptom management, Pain control and Psychosocial/spiritual support    Clinical Assessment/Narrative: Patient is a 65 year old man admitted to the hospital with increased shortness of breath and fluid overload. He has a previous diagnosis of combined heart failure chronic kidney disease stage III COPD hypertension diabetes and stroke. Patient's last admission he was DC'd home on a dobutamine drip. When he presents back to the emergency room on 02/27/2016 he verbalizes to the physician that he would like to stop the dobutamine and pursue comfort care. He states he has discussed this with his family and they are supportive. His last echo in March 2017 showed an ejection fraction of 10-15% his creatinine is now 2.2. Patient has significant dyspnea upon exertion, and 3-5 word dyspnea at rest or with conversation. He was given a breathing treatment during our meeting and did improve with this. In addition to the aforementioned clinical concerns patient has chronic hip and lower extremity pain. He has been seen by a pain clinic and most recently his primary care physician is prescribing his oxycodone, and Lyrica for him. I asked him how he ever evaluated if the oxycodone helped him to breathe and he states he had never considered that as an additional tool. He is been on Percocet 10/325 for approximately 4 years now. He takes 4-6 tablets a day. He does have concerns about going with hospice in terms of what they provide as needed subcutaneous Lasix, Coumadin monitoring and occasional follow-up labs. I did share that it was my understanding that some hospices word  on an intermittent basis provide subcutaneous Lasix with follow-up laboratory evaluation after diuresis, as well as continuation of Coumadin. Recommended that when he meet with the case manager and elects which hospice he wishes to go with that he make that a specific part of the conversation. He is aware that they would not do dobutamine or milrinone, IV fluids, or IV diuresis in the home. Patient lives alone but has a very large supportive family in the area. He verbalizes desire not to return to the hospital. He also expresses that readiness for end-of-life when it is his time  Contacts/Participants in Discussion: Primary Decision Maker: Pt at this time   Relationship to Patient n/a HCPOA: yes  Bobby Rumpf, niece, is his HCPOA [phone]  SUMMARY OF RECOMMENDATIONS When medically ready discharge home with hospice Case management order in place Patient wishes to know that he has the option when he is in fluid overload to have subcutaneous Lasix, follow-up laboratory draws after diuresis, and continuation of Coumadin  Code Status/Advance Care Planning: DNR    Code Status Orders        Start     Ordered   02/27/16 1550  Do not attempt resuscitation (DNR)   Continuous    Question Answer Comment  In the event of cardiac or respiratory ARREST Do not call a "code blue"   In the event of cardiac or respiratory ARREST Do not perform Intubation, CPR, defibrillation or ACLS   In the event of cardiac or respiratory ARREST Use medication by any route, position, wound care, and other measures to relive pain and suffering. May use oxygen, suction and manual treatment of airway obstruction as needed for comfort.  02/27/16 1549    Code Status History    Date Active Date Inactive Code Status Order ID Comments User Context   02/27/2016  6:05 AM 02/27/2016  3:50 PM Full Code [id number]  Hillary Bow, DO ED   02/03/2016  8:50 AM 02/12/2016 10:29 PM Full Code [id number]  Eduard Clos, MD ED    01/14/2016  6:43 AM 01/15/2016  6:23 PM Full Code [id number]  Dwana Melena, PA-C ED   12/12/2015  3:46 AM 12/13/2015  5:10 PM Full Code [id number]  Clydie Braun, MD Inpatient   08/27/2015  4:30 PM 09/03/2015  8:20 PM Full Code [id number]  Bonney Aid, MD Inpatient   07/01/2015 11:15 PM 07/04/2015  6:58 PM Full Code [id number]  Smitty Cords, DO Inpatient   06/19/2015  1:06 AM 06/24/2015  6:31 PM Full Code [id number]  Eduard Clos, MD Inpatient   11/23/2014  9:34 PM 11/26/2014  8:16 PM Full Code [id number]  Latrelle Dodrill, MD Inpatient   10/20/2014  2:07 AM 10/20/2014  4:39 PM Full Code [id number]  Glori Luis, MD Inpatient   09/12/2014  3:39 PM 09/16/2014  4:51 PM Full Code [id number]  Gerrit Halls, PA-C Inpatient   08/13/2014 10:22 AM 08/13/2014  7:29 PM Full Code [id number]  Dolores Patty, MD Inpatient   09/04/2013 12:51 PM 09/09/2013  5:56 PM Full Code [id number]  Maretta Bees, MD Inpatient    Advance Directive Documentation        Most Recent Value   Type of Advance Directive  Healthcare Power of Attorney, Living will   Pre-existing out of facility DNR order (yellow form or pink MOST form)     "MOST" Form in Place?        Other Directives:None  Symptom Management:  Pain: Patient has chronic pain in his hips and lower extremities. He has both somatic as well as neuropathic pain. Discussed discontinuing combined product of oxycodone with acetaminophen and to take oxycodone alone with Tylenol as needed. hHe was in agreement with this plan. He is been on 10 mg of oxycodone for number of years and is finding that it's not lasting him as long. We'll increase his dose to oxycodone 10-15 mg every 4 when necessary. Hopefully hospice will evaluate him for a long acting product with oxycodone for breakthrough. Continue Lyrica Dyspnea: Continue with Lasix when necessary for volume overload. Also discussed at length the role of opioids in managing end-stage dyspnea related  to COPD and combined heart failure. Patient to continue with his oxycodone as needed with evaluation not only in terms of pain relief but as well as shortness of breath. Continue oxygen per nasal cannula Constipation: Continue MiraLAX daily Palliative Prophylaxis:   Bowel Regimen and Frequent Pain Assessment  Additional Recommendations (Limitations, Scope, Preferences):  Full Comfort Care   Psycho-social/Spiritual:  Support System: Strong Desire for further Chaplaincy support:no  Prognosis Less than 6 months in the setting of end-stage combined congestive heart failure with an ejection fraction of 10-15% as well as coexisting chronic kidney disease stage 3-4, COPD diabetes.  Discharge Planning: Home with hospice. Order in place for case management   Chief Complaint/ Primary Diagnoses: Present on Admission:  . Acute on chronic systolic CHF (congestive heart failure), NYHA class 4 (HCC) . Type 2 diabetes mellitus with stage 4 chronic kidney disease, without long-term current use of insulin (HCC) . CKD (chronic kidney disease), stage IV (HCC)  I have reviewed the medical record, interviewed  the patient and family, and examined the patient. The following aspects are pertinent.  Past Medical History  Diagnosis Date  . Atrial fibrillation (HCC)     PAF  . Hypertension   . COPD (chronic obstructive pulmonary disease) (HCC)   . Ocular herpes   . Gout   . CHF (congestive heart failure) (HCC)   . Chronic pain   . Hyperlipidemia   . Nonischemic cardiomyopathy (HCC) Aug 2015    EF 15%  . Ocular herpes   . Neuropathy (HCC)   . Left-sided weakness     "because of the arthritis and edema"  . PVD (peripheral vascular disease) [facility]) May 2015    abnormal ABIs  . Memory impairment     "short term"  . CKD (chronic kidney disease), stage III     Dr. Briant Cedar follows-holding   . Fibromyalgia     neuropathy- hands, more than feet.  . Sleep apnea with use of continuous positive airway  pressure (CPAP)     does not use cpap  . GERD (gastroesophageal reflux disease)     tums as needed  . Asthma   . Type II diabetes mellitus (HCC)   . Stroke [facility]) X 5    last stroke aug 2014"; denies residual on 09/12/2014  . Osteoarthritis   . Arthritis     "knees, right elbow, shoulder" (09/12/2014)  . Shortness of breath dyspnea    Social History   Social History  . Marital Status: Divorced    Spouse Name: N/A  . Number of Children: N/A  . Years of Education: N/A   Social History Main Topics  . Smoking status: Former Smoker -- 0.50 packs/day for 13 years    Types: Cigarettes    Quit date: 12/18/2006  . Smokeless tobacco: Never Used  . Alcohol Use: No     Comment: Quit 2008  . Drug Use: No     Comment: last used marijuana and cocaine 2008  . Sexual Activity: No   Other Topics Concern  . None   Social History Narrative   Family History  Problem Relation Age of Onset  . Cancer Mother   . Hypertension Mother   . Lung disease Mother   . Diabetes Father   . Diabetes Sister   . Hypertension Sister   . Hypertension Brother   . Hypertension Sister   . Colon cancer Neg Hx    Scheduled Meds: . allopurinol  100 mg Oral Daily  . docusate sodium  100 mg Oral BID  . hydrALAZINE  100 mg Oral TID  . ipratropium-albuterol  3 mL Nebulization TID  . isosorbide mononitrate  120 mg Oral Daily  . multivitamin with minerals  1 tablet Oral Daily  . omega-3 acid ethyl esters  1 g Oral Daily  . polyethylene glycol  17 g Oral Daily  . potassium chloride SA  40 mEq Oral BID  . pregabalin  75 mg Oral BID  . sodium chloride flush  3 mL Intravenous Q12H  . tiotropium  18 mcg Inhalation Daily  . torsemide  100 mg Oral BID  . Warfarin - Pharmacist Dosing Inpatient   Does not apply q1800   Continuous Infusions: . amiodarone 60 mg/hr (02/28/16 1630)   Followed by  . amiodarone     PRN Meds:.sodium chloride, acetaminophen, albuterol, baclofen, diclofenac, metolazone, ondansetron  (ZOFRAN) IV, oxyCODONE, sodium chloride flush Medications Prior to Admission:  Prior to Admission medications   Medication Sig Start Date End Date Taking?  Authorizing Provider  albuterol (PROVENTIL HFA;VENTOLIN HFA) 108 (90 BASE) MCG/ACT inhaler Inhale 2 puffs into the lungs every 6 (six) hours as needed for wheezing or shortness of breath. 06/30/15  Yes Gwenlyn Found Copland, MD  albuterol (PROVENTIL) (2.5 MG/3ML) 0.083% nebulizer solution Take 3 mLs (2.5 mg total) by nebulization every 4 (four) hours as needed for wheezing or shortness of breath. 11/26/15  Yes Carmelina Dane, MD  allopurinol (ZYLOPRIM) 100 MG tablet TAKE 1 TABLET (100 MG TOTAL) BY MOUTH DAILY. 10/30/15  Yes Gwenlyn Found Copland, MD  baclofen (LIORESAL) 10 MG tablet TAKE 1 TABLET EVERY 8 HOURS AS NEEDED FOR MUSCLE SPASM(S) 01/12/16  Yes Wallis Bamberg, PA-C  diclofenac (FLECTOR) 1.3 % PTCH Place 1 patch onto the skin 2 (two) times daily as needed (pain). 11/11/15  Yes Gwenlyn Found Copland, MD  docusate sodium 100 MG CAPS Take 100 mg by mouth 2 (two) times daily. 09/16/14  Yes Lanney Gins, PA-C  hydrALAZINE (APRESOLINE) 100 MG tablet Take 1 tablet (100 mg total) by mouth 3 (three) times daily. 02/12/16  Yes Amy D Clegg, NP  ipratropium-albuterol (DUONEB) 0.5-2.5 (3) MG/3ML SOLN Take 3 mLs by nebulization 4 (four) times daily. 12/23/15  Yes Michele Mcalpine, MD  isosorbide mononitrate (IMDUR) 120 MG 24 hr tablet Take 1 tablet (120 mg total) by mouth daily. 02/12/16  Yes Amy D Clegg, NP  metolazone (ZAROXOLYN) 2.5 MG tablet Take 1 tablet (2.5 mg total) by mouth as needed. Tuesday and Friday 02/12/16  Yes Amy D Clegg, NP  Multiple Vitamins-Minerals (MULTIVITAMIN WITH MINERALS) tablet Take 1 tablet by mouth daily.   Yes Historical Provider, MD  omega-3 acid ethyl esters (LOVAZA) 1 G capsule Take 1 g by mouth daily.   Yes Historical Provider, MD  oxyCODONE-acetaminophen (PERCOCET) 10-325 MG tablet Take 1 tablet by mouth every 6 (six) hours as needed for  pain. Use with stool softerner. 02/23/16  Yes Jessica C Copland, MD  polyethylene glycol powder (GLYCOLAX/MIRALAX) powder MIX 1 CAPFUL (17GM)  IN  LIQUID  AND  DRINK EVERY DAY AS DIRECTED 07/31/15  Yes Gwenlyn Found Copland, MD  potassium chloride SA (K-DUR,KLOR-CON) 20 MEQ tablet Take 2 tablets (40 mEq total) by mouth 2 (two) times daily. 02/23/16  Yes Graciella Freer, PA-C  pregabalin (LYRICA) 75 MG capsule Take 75 mg by mouth 2 (two) times daily.   Yes Historical Provider, MD  tiotropium (SPIRIVA HANDIHALER) 18 MCG inhalation capsule Place 1 capsule (18 mcg total) into inhaler and inhale daily. 09/29/15  Yes Michele Mcalpine, MD  torsemide (DEMADEX) 20 MG tablet Take 5 tablets (100 mg total) by mouth 2 (two) times daily. 02/12/16  Yes Amy D Clegg, NP  warfarin (COUMADIN) 10 MG tablet Take 10 mg daily until follow up Patient taking differently: Take 5-10 mg by mouth See admin instructions. Take 0.5 tablet on Tuesday, Wednesday, Thursday then take 1 tablet all the other days 02/12/16  Yes Amy D Clegg, NP  DOBUTamine (DOBUTREX) 4-5 MG/ML-% infusion Inject 190.4 mcg/min into the vein continuous. From [facility] pharmacy x 12 months Patient not taking: Reported on 02/27/2016 02/12/16   Sherald Hess, NP   Allergies  Allergen Reactions  . Corlanor [Ivabradine] Palpitations and Other (See Comments)    Kidney and heart problems, syncope  . Entresto [Sacubitril-Valsartan]     Syncope  . Januvia [Sitagliptin] Other (See Comments)    "Almost killed me"   . Lunesta [Eszopiclone] Palpitations and Other (See Comments)    Kidney and  heart problems, syncope  . Actos [Pioglitazone] Other (See Comments)    Caused blood in urine and fluid retention     Review of Systems  Constitutional: Positive for activity change and fatigue.  HENT: Negative.   Eyes: Negative.   Respiratory: Positive for chest tightness and shortness of breath.   Cardiovascular: Positive for leg swelling.  Endocrine: Negative.   Genitourinary: Positive  for frequency.  Musculoskeletal:       Chronic bilat hip and leg pain  Allergic/Immunologic: Negative.   Neurological: Positive for weakness and numbness.  Hematological: Negative.   Psychiatric/Behavioral: Negative.     Physical Exam  Constitutional: He is oriented to person, place, and time. He appears well-nourished.  Older man, alert, in no acute distress  HENT:  Head: Normocephalic and atraumatic.  Neck: Normal range of motion.  Respiratory:  5 word dyspnea observed at rest. Improved after breathing treatment  Musculoskeletal: Normal range of motion.  Neurological: He is alert and oriented to person, place, and time.  Skin: Skin is warm and dry.  Psychiatric: He has a normal mood and affect.    Vital Signs: BP 137/84 mmHg  Pulse 132  Temp(Src) 98.1 F (36.7 C) (Oral)  Resp 13  Ht 5\' 10"  (1.778 m)  Wt 96.2 kg (212 lb 1.3 oz)  BMI 30.43 kg/m2  SpO2 100%  SpO2: SpO2: 100 % O2 Device:SpO2: 100 % O2 Flow Rate: .O2 Flow Rate (L/min): 2 L/min  IO: Intake/output summary:  Intake/Output Summary (Last 24 hours) at 02/28/16 1746 Last data filed at 02/28/16 1700  Gross per 24 hour  Intake 3617.48 ml  Output   1000 ml  Net 2617.48 ml    LBM: Last BM Date: 02/27/16 Baseline Weight: Weight: 94.802 kg (209 lb) Most recent weight: Weight: 96.2 kg (212 lb 1.3 oz)      Palliative Assessment/Data:  Flowsheet Rows        Most Recent Value   Intake Tab    Referral Department  Hospitalist   Unit at Time of Referral  Intermediate Care Unit   Palliative Care Primary Diagnosis  Cardiac   Date Notified  02/27/16   Palliative Care Type  New Palliative care   Reason for referral  Clarify Goals of Care, Counsel Regarding Hospice   Date of Admission  02/27/16   Date first seen by Palliative Care  02/28/16   # of days Palliative referral response time  1 Day(s)   # of days IP prior to Palliative referral  0   Clinical Assessment    Palliative Performance Scale Score  40%   Pain  Max last 24 hours  6   Pain Min Last 24 hours  2   Dyspnea Max Last 24 Hours  8   Dyspnea Min Last 24 hours  2   Nausea Max Last 24 Hours  0   Nausea Min Last 24 Hours  0   Anxiety Max Last 24 Hours  0   Anxiety Min Last 24 Hours  0   Other Max Last 24 Hours  0   Psychosocial & Spiritual Assessment    Palliative Care Outcomes    Who was at the meeting?  pt   Palliative Care Outcomes  Improved pain interventions, Improved non-pain symptom therapy, Clarified goals of care, Counseled regarding hospice, Transitioned to hospice   Patient/Family wishes: Interventions discontinued/not started   Mechanical Ventilation, NIPPV, BiPAP, Trach, Hemodialysis, PEG, Vasopressors, Tube feedings/TPN      Additional Data Reviewed:  CBC:  Component Value Date/Time   WBC 7.3 02/27/2016 0258   WBC 12.6* 11/13/2015 2009   HGB 12.7* 02/27/2016 0258   HGB 11.1* 11/13/2015 2009   HCT 40.1 02/27/2016 0258   HCT 33.6* 11/13/2015 2009   PLT 337 02/27/2016 0258   MCV 84.4 02/27/2016 0258   MCV 84.2 11/13/2015 2009   NEUTROABS 7.2 02/03/2016 0914   LYMPHSABS 0.7 02/03/2016 0914   MONOABS 0.2 02/03/2016 0914   EOSABS 0.0 02/03/2016 0914   BASOSABS 0.0 02/03/2016 0914   Comprehensive Metabolic Panel:    Component Value Date/Time   NA 135 02/28/2016 0338   K 3.4* 02/28/2016 0338   CL 90* 02/28/2016 0338   CO2 30 02/28/2016 0338   BUN 47* 02/28/2016 0338   CREATININE 2.24* 02/28/2016 0338   CREATININE 3.04* 11/26/2015 1516   GLUCOSE 141* 02/28/2016 0338   CALCIUM 9.5 02/28/2016 0338   AST 55* 02/19/2016 1100   ALT 26 02/19/2016 1100   ALKPHOS 69 02/19/2016 1100   BILITOT 1.1 02/19/2016 1100   PROT 7.9 02/19/2016 1100   ALBUMIN 4.0 02/19/2016 1100     Time In: 1600 Time Out: 1715 Time Total: 75 min Greater than 50%  of this time was spent counseling and coordinating care related to the above assessment and plan. Staffed with Dr. Benjamine Mola  Signed by: Irean Hong, NP  Irean Hong, NP  02/28/2016, 5:46 PM  Please contact Palliative Medicine Team phone at [phone] for questions and concerns.

## 2016-02-29 DIAGNOSIS — R570 Cardiogenic shock: Secondary | ICD-10-CM

## 2016-02-29 DIAGNOSIS — Z515 Encounter for palliative care: Secondary | ICD-10-CM

## 2016-02-29 DIAGNOSIS — N179 Acute kidney failure, unspecified: Secondary | ICD-10-CM

## 2016-02-29 DIAGNOSIS — I482 Chronic atrial fibrillation, unspecified: Secondary | ICD-10-CM | POA: Insufficient documentation

## 2016-02-29 DIAGNOSIS — E875 Hyperkalemia: Secondary | ICD-10-CM | POA: Insufficient documentation

## 2016-02-29 LAB — BASIC METABOLIC PANEL
ANION GAP: 22 — AB (ref 5–15)
Anion gap: 25 — ABNORMAL HIGH (ref 5–15)
BUN: 60 mg/dL — ABNORMAL HIGH (ref 6–20)
BUN: 67 mg/dL — ABNORMAL HIGH (ref 6–20)
CALCIUM: 9.1 mg/dL (ref 8.9–10.3)
CO2: 21 mmol/L — AB (ref 22–32)
CO2: 17 mmol/L — ABNORMAL LOW (ref 22–32)
Calcium: 9.4 mg/dL (ref 8.9–10.3)
Chloride: 85 mmol/L — ABNORMAL LOW (ref 101–111)
Chloride: 84 mmol/L — ABNORMAL LOW (ref 101–111)
Creatinine, Ser: 3.68 mg/dL — ABNORMAL HIGH (ref 0.61–1.24)
Creatinine, Ser: 4.34 mg/dL — ABNORMAL HIGH (ref 0.61–1.24)
GFR calc Af Amer: 15 mL/min — ABNORMAL LOW (ref >60)
GFR calc non Af Amer: 16 mL/min — ABNORMAL LOW (ref >60)
GFR, EST AFRICAN AMERICAN: 18 mL/min — AB (ref >60)
GFR, EST NON AFRICAN AMERICAN: 13 mL/min — AB (ref >60)
GLUCOSE: 113 mg/dL — AB (ref 65–99)
Glucose, Bld: 169 mg/dL — ABNORMAL HIGH (ref 65–99)
POTASSIUM: 6 mmol/L — AB (ref 3.5–5.1)
POTASSIUM: 5.5 mmol/L — AB (ref 3.5–5.1)
Sodium: 128 mmol/L — ABNORMAL LOW (ref 135–145)
Sodium: 126 mmol/L — ABNORMAL LOW (ref 135–145)

## 2016-02-29 LAB — PROTIME-INR
INR: 3.51 — ABNORMAL HIGH (ref 0.00–1.49)
Prothrombin Time: 34.5 seconds — ABNORMAL HIGH (ref 11.6–15.2)

## 2016-02-29 LAB — GLUCOSE, CAPILLARY
GLUCOSE-CAPILLARY: 171 mg/dL — AB (ref 65–99)
GLUCOSE-CAPILLARY: 126 mg/dL — AB (ref 65–99)
Glucose-Capillary: 74 mg/dL (ref 65–99)
Glucose-Capillary: 204 mg/dL — ABNORMAL HIGH (ref 65–99)

## 2016-02-29 MED ORDER — FUROSEMIDE 10 MG/ML IJ SOLN
80.0000 mg | Freq: Two times a day (BID) | INTRAMUSCULAR | Status: DC
  Administered 2016-02-29: 80 mg via INTRAVENOUS
  Filled 2016-02-29: qty 8

## 2016-02-29 MED ORDER — DOBUTAMINE IN D5W 4-5 MG/ML-% IV SOLN
5.0000 ug/kg/min | INTRAVENOUS | Status: DC
  Administered 2016-02-29 – 2016-03-02 (×2): 5 ug/kg/min via INTRAVENOUS
  Filled 2016-02-29 (×2): qty 250

## 2016-02-29 NOTE — Progress Notes (Addendum)
Amiodarone Drug - Drug Interaction Consult Note  Recommendations: Will continue to monitor INR and reduce Warfarin dose due to interaction with amiodarone.  The interaction increases the risk of bleeding, please continue to monitor.  Amiodarone is metabolized by the cytochrome P450 system and therefore has the potential to cause many drug interactions. Amiodarone has an average plasma half-life of 50 days (range 20 to 100 days).   There is potential for drug interactions to occur several weeks or months after stopping treatment and the onset of drug interactions may be slow after initiating amiodarone.   [x]  Anticoagulants: Amiodarone can increase anticoagulant effect. Consider warfarin dose reduction. Patients should be monitored closely and the dose of anticoagulant altered accordingly, remembering that amiodarone levels take several weeks to stabilize.  [x]  Thank Alexander Bergeron  02/29/2016 7:48 AM

## 2016-02-29 NOTE — Progress Notes (Signed)
Discussed with patient whether he wanted to start dobutamine tonight. Patient stated that he would like to try dobutamine tonight. Will start drip per MD order.

## 2016-02-29 NOTE — Progress Notes (Addendum)
Advanced Heart Failure Rounding Note   Subjective:      IV amio started for chronic AF with RVR yesterday. Rate improved.  Was nauseated last night + SOB. This am feels better but a bit lethargic. However remains conversant.   K 5.5 Cr up to 4.3   Objective:   Weight Range:  Vital Signs:   Temp:  [97.3 F (36.3 C)-98.2 F (36.8 C)] 97.3 F (36.3 C) (04/09 0751) Pulse Rate:  [74-150] 93 (04/09 0751) Resp:  [13-29] 23 (04/09 0751) BP: (92-146)/(67-117) 100/79 mmHg (04/09 0751) SpO2:  [92 %-100 %] 97 % (04/09 0754) Weight:  [98 kg (216 lb 0.8 oz)] 98 kg (216 lb 0.8 oz) (04/09 0355) Last BM Date: 02/27/16  Weight change: Filed Weights   02/27/16 1500 02/28/16 0345 02/29/16 0355  Weight: 95.8 kg (211 lb 3.2 oz) 96.2 kg (212 lb 1.3 oz) 98 kg (216 lb 0.8 oz)    Intake/Output:   Intake/Output Summary (Last 24 hours) at 02/29/16 1314 Last data filed at 02/29/16 1100  Gross per 24 hour  Intake 4373.04 ml  Output      0 ml  Net 4373.04 ml     Physical Exam: General: Obese, pleasant HEENT: normal Neck: supple. JVP 9. Carotids 2+ bilat; no bruits. No thyromegaly or nodule noted. Cor: PMI nondisplaced. irregularly irregular.  Lungs: clear Abdomen: Obese, soft, NT, non distended, no HSM. No bruits or masses. +BS  Extremities: no cyanosis, clubbing, rash, tr  edema. cool Neuro: alert & orientedx3 but a bit lethargic at times cranial nerves grossly intact. moves all 4 extremities w/o difficulty. Affect pleasant  Telemetry:  AF 70-80s  Labs: Basic Metabolic Panel:  Recent Labs Lab 02/27/16 0258 02/28/16 0338 02/29/16 0332 02/29/16 1138  NA 137 135 128* 126*  K 2.9* 3.4* 6.0* 5.5*  CL 88* 90* 85* 84*  CO2 30 30 21* 17*  GLUCOSE 91 141* 113* 169*  BUN 52* 47* 60* 67*  CREATININE 2.22* 2.24* 3.68* 4.34*  CALCIUM 10.3 9.5 9.4 9.1    Liver Function Tests: No results for input(s): AST, ALT, ALKPHOS, BILITOT, PROT, ALBUMIN in the last 168 hours. No results  for input(s): LIPASE, AMYLASE in the last 168 hours. No results for input(s): AMMONIA in the last 168 hours.  CBC:  Recent Labs Lab 02/27/16 0258  WBC 7.3  HGB 12.7*  HCT 40.1  MCV 84.4  PLT 337    Cardiac Enzymes:  Recent Labs Lab 02/27/16 0258  TROPONINI 0.14*    BNP: BNP (last 3 results)  Recent Labs  02/03/16 0115 02/19/16 1100 02/27/16 0258  BNP 750.3* 400.3* 864.7*    ProBNP (last 3 results) No results for input(s): PROBNP in the last 8760 hours.    Other results:  Imaging: No results found.   Medications:     Scheduled Medications: . allopurinol  100 mg Oral Daily  . docusate sodium  100 mg Oral BID  . hydrALAZINE  100 mg Oral TID  . ipratropium-albuterol  3 mL Nebulization TID  . isosorbide mononitrate  120 mg Oral Daily  .  morphine injection  2 mg Intravenous Once  . multivitamin with minerals  1 tablet Oral Daily  . omega-3 acid ethyl esters  1 g Oral Daily  . polyethylene glycol  17 g Oral Daily  . pregabalin  75 mg Oral BID  . sodium chloride flush  3 mL Intravenous Q12H  . tiotropium  18 mcg Inhalation Daily  . torsemide  100 mg Oral BID  . Warfarin - Pharmacist Dosing Inpatient   Does not apply q1800    Infusions: . amiodarone 30 mg/hr (02/29/16 1100)    PRN Medications: sodium chloride, acetaminophen, albuterol, baclofen, diclofenac, metolazone, ondansetron (ZOFRAN) IV, oxyCODONE, senna, sodium chloride flush   Assessment:   1. Acute respiratory failure with hypoxia 2. Acute on chronic systolic CHF, end-stage -> cardiogenic shock 3. Acute on chronic renal failure stage IV 4. Chronic Afib now with RVR 5. HTN 6. DM2 7. OSA -  8. Hyponatremia. 9. hyperkalemia 10 DNR/DNI  Plan/Discussion:     He has recurrent cardiogenic shock with worsening renal function, cool extremities, hyponatremia and hyperkalemia. Refusing inotropic support. Suspect he will continue to deteriorate and have in hospital death. I confirmed  DNR/DNI. If gets uncomfortable would have low threshold to start morphine and versed drips.  Stop all nonessential meds.   On amio for AF with RVR. Rate improved.   Length of Stay: 2 Arvilla Meres MD 02/29/2016, 1:14 PM  Advanced Heart Failure Team Pager [phone] (M-F; 7a - 4p)  Please contact [facility] for night-coverage after hours (4p -7a ) and weekends on [email or web address]  Addendum:  I went back and discussed the situation with Mr. Klabunde again. Reiterating that without inotropic support he likely has days to live. He now would like to try the dobutamine again. This would prohibit Hospice involvement. We will restart though he realizes it may be too late for recovery.  He remains DNR/DNI.  The patient is critically ill with multiple organ systems failure and requires high complexity decision making for assessment and support, frequent evaluation and titration of therapies, application of advanced monitoring technologies and extensive interpretation of multiple databases.   Critical Care Time devoted to patient care services described in this note is 35 Minutes.   Manuela Halbur,MD 1:32 PM  Addendum:  Mr. Shirkey called me back in the room and said he is tired and would really like to go home with Hospice and he is concerned that he can't do this on dobutamine. We talked about the options of an in-hospital death, [facility], home hospice or returning home with dobutamine. He wants to try dobutamine again overnight and then decide in the am.   Jermayne Sweeney,MD 1:41 PM

## 2016-02-29 NOTE — Progress Notes (Signed)
Called by RN to speak with patient. Patient had just met with his cardiologist who indicated that if he did not continue dobutamine he may not survive this hospitalization. Met with patient again and reviewed options of resuming dobutamine, going home with hospice without dobutamine for end-of-life, going to hospice inpatient without dobutamine for end-of-life. Patient shares that he was surprised about his physicians prognosis, but states he is accepting of it and does not wish to resume the dobutamine. His plan is to still go home to his niece's house "forever time I have left be a days to a week". I did stress to him that once he was within hospice at that if he needed to transfer to their inpatient unit that would not be a problem. We discussed what end-of-life could look like with end-stage heart failure, principally the management of shortness of breath and fluid overload. I did ask him again about dobutamine and also offered having dobutamine given just until discharge. He was firm in no more dobutamine. He states he knows he is end-of-life. He has thought how he would like to spend it, and feels that full comfort measures is in keeping with his philosophy, and dignity. Thank you for the pleasure of meeting Frank Davidson. Romona Curls, NP

## 2016-02-29 NOTE — Progress Notes (Signed)
PROGRESS NOTE  Frank Davidson BZJ:[id number] DOB: 05-15-1951 DOA: 02/27/2016 PCP: Abbe Amsterdam, MD  Assessment/Plan: Acute on chronic systolic CHF NYHA class 4 - -refusing dobutamine -hospice at home -PRN SQ lasix  Chronic anticoagulation - coumadin per pharm consult  CKD stage 4 - stable  DM2 - appears diet controlled at this time  Atrial fibrillation -fast this afternoon -cards added amio- rate better  hyperkalemia -d/c'd PO kdur until repeat back (hemolyzed)  Code Status: dnr Family Communication: no family at bedside Disposition Plan:    Consultants:  chf team  Palliative care  Procedures:      HPI/Subjective: Did not sleep well last PM  Objective: Filed Vitals:   02/29/16 0355 02/29/16 0751  BP: 110/85 100/79  Pulse: 96 93  Temp: 97.6 F (36.4 C) 97.3 F (36.3 C)  Resp: 17 23    Intake/Output Summary (Last 24 hours) at 02/29/16 1238 Last data filed at 02/29/16 1100  Gross per 24 hour  Intake 4381.38 ml  Output      0 ml  Net 4381.38 ml   Filed Weights   02/27/16 1500 02/28/16 0345 02/29/16 0355  Weight: 95.8 kg (211 lb 3.2 oz) 96.2 kg (212 lb 1.3 oz) 98 kg (216 lb 0.8 oz)    Exam:   General:  Awake, NAD  Cardiovascular: irr  Respiratory: diminished  Abdomen: obese  Musculoskeletal: +edema   Data Reviewed: Basic Metabolic Panel:  Recent Labs Lab 02/27/16 0258 02/28/16 0338 02/29/16 0332  NA 137 135 128*  K 2.9* 3.4* 6.0*  CL 88* 90* 85*  CO2 30 30 21*  GLUCOSE 91 141* 113*  BUN 52* 47* 60*  CREATININE 2.22* 2.24* 3.68*  CALCIUM 10.3 9.5 9.4   Liver Function Tests: No results for input(s): AST, ALT, ALKPHOS, BILITOT, PROT, ALBUMIN in the last 168 hours. No results for input(s): LIPASE, AMYLASE in the last 168 hours. No results for input(s): AMMONIA in the last 168 hours. CBC:  Recent Labs Lab 02/27/16 0258  WBC 7.3  HGB 12.7*  HCT 40.1  MCV 84.4  PLT 337   Cardiac Enzymes:  Recent Labs Lab  02/27/16 0258  TROPONINI 0.14*   BNP (last 3 results)  Recent Labs  02/03/16 0115 02/19/16 1100 02/27/16 0258  BNP 750.3* 400.3* 864.7*    ProBNP (last 3 results) No results for input(s): PROBNP in the last 8760 hours.  CBG:  Recent Labs Lab 02/28/16 0824 02/28/16 1250 02/28/16 1741 02/28/16 2239 02/29/16 0752  GLUCAP 176* 175* 125* 136* 74    No results found for this or any previous visit (from the past 240 hour(s)).   Studies: No results found.  Scheduled Meds: . allopurinol  100 mg Oral Daily  . docusate sodium  100 mg Oral BID  . hydrALAZINE  100 mg Oral TID  . ipratropium-albuterol  3 mL Nebulization TID  . isosorbide mononitrate  120 mg Oral Daily  .  morphine injection  2 mg Intravenous Once  . multivitamin with minerals  1 tablet Oral Daily  . omega-3 acid ethyl esters  1 g Oral Daily  . polyethylene glycol  17 g Oral Daily  . pregabalin  75 mg Oral BID  . sodium chloride flush  3 mL Intravenous Q12H  . tiotropium  18 mcg Inhalation Daily  . torsemide  100 mg Oral BID  . Warfarin - Pharmacist Dosing Inpatient   Does not apply q1800   Continuous Infusions: . amiodarone 30 mg/hr (02/29/16 1100)   Antibiotics Given (  last 72 hours)    None      Principal Problem:   Acute on chronic systolic CHF (congestive heart failure), NYHA class 4 (HCC) Active Problems:   Chronic anticoagulation   Type 2 diabetes mellitus with stage 4 chronic kidney disease, without long-term current use of insulin (HCC)   CKD (chronic kidney disease), stage IV [facility])   Palliative care encounter    Time spent: 25 min    [facility]  [facility] Pager [phone] If 7PM-7AM, please contact night-coverage at [email or web address], password [email or web address] 02/29/2016, 12:38 PM  LOS: 2 days

## 2016-02-29 NOTE — Progress Notes (Signed)
ANTICOAGULATION CONSULT NOTE - Initial Consult  Pharmacy Consult for Coumadin Indication: PAF and h/o CVA  Allergies  Allergen Reactions  . Corlanor [Ivabradine] Palpitations and Other (See Comments)    Kidney and heart problems, syncope  . Entresto [Sacubitril-Valsartan]     Syncope  . Januvia [Sitagliptin] Other (See Comments)    "Almost killed me"   . Lunesta [Eszopiclone] Palpitations and Other (See Comments)    Kidney and heart problems, syncope  . Actos [Pioglitazone] Other (See Comments)    Caused blood in urine and fluid retention     Patient Measurements: Height:  (177.8 cm) Weight: 216 lb 0.8 oz (98 kg) IBW/kg (Calculated) : 73  Vital Signs: Temp: 97.6 F (36.4 C) (04/09 0355) Temp Source: Oral (04/09 0355) BP: 110/85 mmHg (04/09 0355) Pulse Rate: 96 (04/09 0355)  Labs:  Recent Labs  02/27/16 0258 02/28/16 0338 02/29/16 0332  HGB 12.7*  --   --   HCT 40.1  --   --   PLT 337  --   --   LABPROT 34.9* 35.8* 34.5*  INR 3.57* 3.70* 3.51*  CREATININE 2.22* 2.24* 3.68*  TROPONINI 0.14*  --   --     Estimated Creatinine Clearance: 23.5 mL/min (by C-G formula based on Cr of 3.68).   Medical History: Past Medical History  Diagnosis Date  . Atrial fibrillation (HCC)     PAF  . Hypertension   . COPD (chronic obstructive pulmonary disease) (HCC)   . Ocular herpes   . Gout   . CHF (congestive heart failure) (HCC)   . Chronic pain   . Hyperlipidemia   . Nonischemic cardiomyopathy (HCC) Aug 2015    EF 15%  . Ocular herpes   . Neuropathy (HCC)   . Left-sided weakness     "because of the arthritis and edema"  . PVD (peripheral vascular disease) [facility]) May 2015    abnormal ABIs  . Memory impairment     "short term"  . CKD (chronic kidney disease), stage III     Dr. Briant Cedar follows-holding   . Fibromyalgia     neuropathy- hands, more than feet.  . Sleep apnea with use of continuous positive airway pressure (CPAP)     does not use cpap  . GERD  (gastroesophageal reflux disease)     tums as needed  . Asthma   . Type II diabetes mellitus (HCC)   . Stroke [facility]) X 5    last stroke aug 2014"; denies residual on 09/12/2014  . Osteoarthritis   . Arthritis     "knees, right elbow, shoulder" (09/12/2014)  . Shortness of breath dyspnea     Assessment: 65yo male c/o worsening SOB over ~12hr, eventually also experienced generalized weakness, admitted for possible CHF exacerbation, to continue Coumadin for PAF and h/o CVA; current INR above goal w/ last dose of Coumadin taken 4/6; of note pt was seen at anti-coag clinic on 4/4 when INR was 5.2, instructed to skip two days and decrease weekly dose by  (was  weekly, changed to  weekly) so INR may still be on its way down.  PTA dose = 10 mg daily except 5 mg T/Th/Sat  INR supratherapeutic at 3.51. Hgb 12.7, Plts 337.  No bleeding noted.   DDI - IV Amiodarone added 3/8, would expect INR to continue to rise over the next couple days.    Goal of Therapy:  INR 2-3   Plan:  Will hold dose today x 1 Daily INR,  CBC q3 days Monitor for s/sx of bleeding   Hazle Nordmann, PharmD Pharmacy Resident [phone]

## 2016-03-01 DIAGNOSIS — G4700 Insomnia, unspecified: Secondary | ICD-10-CM | POA: Insufficient documentation

## 2016-03-01 DIAGNOSIS — E875 Hyperkalemia: Secondary | ICD-10-CM

## 2016-03-01 DIAGNOSIS — R059 Cough, unspecified: Secondary | ICD-10-CM | POA: Insufficient documentation

## 2016-03-01 DIAGNOSIS — R0600 Dyspnea, unspecified: Secondary | ICD-10-CM

## 2016-03-01 DIAGNOSIS — R05 Cough: Secondary | ICD-10-CM

## 2016-03-01 LAB — GLUCOSE, CAPILLARY
GLUCOSE-CAPILLARY: 152 mg/dL — AB (ref 65–99)
GLUCOSE-CAPILLARY: 171 mg/dL — AB (ref 65–99)
Glucose-Capillary: 149 mg/dL — ABNORMAL HIGH (ref 65–99)

## 2016-03-01 LAB — CBC
HCT: 33 % — ABNORMAL LOW (ref 39.0–52.0)
HEMOGLOBIN: 10.5 g/dL — AB (ref 13.0–17.0)
MCH: 26.2 pg (ref 26.0–34.0)
MCHC: 31.8 g/dL (ref 30.0–36.0)
MCV: 82.3 fL (ref 78.0–100.0)
Platelets: 245 10*3/uL (ref 150–400)
RBC: 4.01 MIL/uL — AB (ref 4.22–5.81)
RDW: 17 % — ABNORMAL HIGH (ref 11.5–15.5)
WBC: 12.8 10*3/uL — AB (ref 4.0–10.5)

## 2016-03-01 LAB — BASIC METABOLIC PANEL
ANION GAP: 19 — AB (ref 5–15)
BUN: 81 mg/dL — ABNORMAL HIGH (ref 6–20)
CALCIUM: 9.3 mg/dL (ref 8.9–10.3)
CO2: 23 mmol/L (ref 22–32)
Chloride: 81 mmol/L — ABNORMAL LOW (ref 101–111)
Creatinine, Ser: 4.99 mg/dL — ABNORMAL HIGH (ref 0.61–1.24)
GFR, EST AFRICAN AMERICAN: 13 mL/min — AB (ref >60)
GFR, EST NON AFRICAN AMERICAN: 11 mL/min — AB (ref >60)
Glucose, Bld: 138 mg/dL — ABNORMAL HIGH (ref 65–99)
Potassium: 4.9 mmol/L (ref 3.5–5.1)
SODIUM: 123 mmol/L — AB (ref 135–145)

## 2016-03-01 MED ORDER — HYDROCOD POLST-CPM POLST ER 10-8 MG/5ML PO SUER
5.0000 mL | Freq: Two times a day (BID) | ORAL | Status: DC
  Administered 2016-03-01 – 2016-03-02 (×3): 5 mL via ORAL
  Filled 2016-03-01 (×3): qty 5

## 2016-03-01 MED ORDER — CAMPHOR-MENTHOL 0.5-0.5 % EX LOTN
TOPICAL_LOTION | CUTANEOUS | Status: DC | PRN
  Administered 2016-03-01: 20:00:00 via TOPICAL
  Filled 2016-03-01: qty 222

## 2016-03-01 MED ORDER — ZOLPIDEM TARTRATE 5 MG PO TABS
5.0000 mg | ORAL_TABLET | Freq: Every day | ORAL | Status: DC
  Administered 2016-03-01: 5 mg via ORAL
  Filled 2016-03-01: qty 1

## 2016-03-01 MED ORDER — LORAZEPAM 2 MG/ML IJ SOLN
1.0000 mg | Freq: Four times a day (QID) | INTRAMUSCULAR | Status: DC | PRN
Start: 2016-03-01 — End: 2016-03-02
  Administered 2016-03-02: 1 mg via INTRAVENOUS
  Filled 2016-03-01: qty 1

## 2016-03-01 MED ORDER — ZOLPIDEM TARTRATE 5 MG PO TABS: 5.0000 mg | ORAL_TABLET | Freq: Every evening | ORAL | Status: DC | PRN

## 2016-03-01 MED ORDER — PREGABALIN 75 MG PO CAPS
75.0000 mg | ORAL_CAPSULE | Freq: Every day | ORAL | Status: DC
  Administered 2016-03-01 – 2016-03-02 (×2): 75 mg via ORAL
  Filled 2016-03-01 (×2): qty 1

## 2016-03-01 MED ORDER — FENTANYL CITRATE (PF) 100 MCG/2ML IJ SOLN
12.5000 ug | INTRAMUSCULAR | Status: DC | PRN
  Administered 2016-03-01: 12.5 ug via INTRAVENOUS
  Filled 2016-03-01: qty 2

## 2016-03-01 MED ORDER — FUROSEMIDE 10 MG/ML IJ SOLN
160.0000 mg | Freq: Two times a day (BID) | INTRAVENOUS | Status: DC
  Administered 2016-03-01 – 2016-03-02 (×3): 160 mg via INTRAVENOUS
  Filled 2016-03-01 (×4): qty 16

## 2016-03-01 MED ORDER — MORPHINE SULFATE (PF) 2 MG/ML IV SOLN: 2.0000 mg | INTRAVENOUS | Status: DC | PRN

## 2016-03-01 NOTE — Progress Notes (Signed)
Patient is beginning to have blood in his urine. When foley catheter was placed urine was amber but did not appear to have any blood at that time. Dr. Benjamine Mola was paged to notify.  Patient also reports that he can not tolerate morphine because it causes him to itch and he asked if something else could be ordered. Paged the palliative team to notify.

## 2016-03-01 NOTE — Clinical Social Work Note (Signed)
Patient has bed at [facility] of the [place] for d/c on 4/11. CSW to notify MD.   CSW remains available as needed.   Derenda Fennel, MSW, LCSWA [phone] 03/01/2016 4:54 PM

## 2016-03-01 NOTE — Progress Notes (Addendum)
Daily Progress Note   Patient Name: Frank Davidson       Date: 03/01/2016 DOB: Jan 16, 1951  Age: 65 y.o. MRN#: [id number] Attending Physician: Joseph Art, DO Primary Care Physician: Abbe Amsterdam, MD Admit Date: 02/27/2016  Reason for Consultation/Follow-up: Disposition and Establishing goals of care  Subjective: Complains of insomnia, and bronchial sounding cough.  He also complains of very poor urinary output despite receiving lasix - yet he does not want a foley catheter.   He has chosen respite/Hospice care.  We discussed the dobutamine drip.  The dobutamine drip will be discontinued when the patient is discharged from [facility] and will not be resumed at [facility].  Interval Events: Patient understands that his weak heart is the cause of his failing.  We discussed his education/family/religion.  He is an exceptional individual.    Length of Stay: 3 days  Current Medications: Scheduled Meds:  . chlorpheniramine-HYDROcodone  5 mL Oral Q12H  . furosemide  160 mg Intravenous BID  . hydrALAZINE  100 mg Oral TID  . ipratropium-albuterol  3 mL Nebulization TID  . isosorbide mononitrate  120 mg Oral Daily  .  morphine injection  2 mg Intravenous Once  . polyethylene glycol  17 g Oral Daily  . pregabalin  75 mg Oral Daily  . sodium chloride flush  3 mL Intravenous Q12H  . tiotropium  18 mcg Inhalation Daily  . zolpidem  5 mg Oral QHS    Continuous Infusions: . amiodarone 30 mg/hr (03/01/16 0936)  . DOBUTamine 5 mcg/kg/min (03/01/16 0600)    PRN Meds: sodium chloride, acetaminophen, albuterol, baclofen, camphor-menthol, diclofenac, ondansetron (ZOFRAN) IV, oxyCODONE, senna, sodium chloride flush, zolpidem   Physical Exam      Wd AA male, extremely pleasant, sitting on the  side of the bed. Resp:  On N/C with slightly increased work of breathing while speaking CV:  S1, S2 Extremities:  Able to move all 4.         Vital Signs: BP 117/76 mmHg  Pulse 73  Temp(Src) 97 F (36.1 C) (Axillary)  Resp 10  Ht  (1.778 m)  Wt 100.6 kg (221 lb 12.5 oz)  BMI 31.82 kg/m2  SpO2 96% SpO2: SpO2: 96 % O2 Device: O2 Device: Not Delivered O2 Flow Rate: O2 Flow Rate (L/min): 1 L/min  Intake/output summary:  Intake/Output Summary (Last 24 hours) at 03/01/16 1359 Last data filed at 03/01/16 0600  Gross per 24 hour  Intake 513.78 ml  Output      0 ml  Net 513.78 ml   LBM: Last BM Date: 02/28/16 Baseline Weight: Weight: 94.802 kg (209 lb) Most recent weight: Weight: 100.6 kg (221 lb 12.5 oz)       Palliative Assessment/Data: Flowsheet Rows        Most Recent Value   Intake Tab    Referral Department  Hospitalist   Unit at Time of Referral  Intermediate Care Unit   Palliative Care Primary Diagnosis  Cardiac   Date Notified  02/27/16   Palliative Care Type  New Palliative care   Reason for referral  Clarify Goals of Care, Counsel Regarding Hospice   Date of Admission  02/27/16   Date first seen by Palliative Care  02/28/16   # of days Palliative referral response time  1 Day(s)   # of days IP prior to Palliative referral  0   Clinical Assessment    Palliative Performance Scale Score  40%   Pain Max last 24 hours  6   Pain Min Last 24 hours  2   Dyspnea Max Last 24 Hours  8   Dyspnea Min Last 24 hours  2   Nausea Max Last 24 Hours  0   Nausea Min Last 24 Hours  0   Anxiety Max Last 24 Hours  0   Anxiety Min Last 24 Hours  0   Other Max Last 24 Hours  0   Psychosocial & Spiritual Assessment    Palliative Care Outcomes    Who was at the meeting?  pt   Palliative Care Outcomes  Improved pain interventions, Improved non-pain symptom therapy, Clarified goals of care, Counseled regarding hospice, Transitioned to hospice   Patient/Family wishes:  Interventions discontinued/not started   Mechanical Ventilation, NIPPV, BiPAP, Trach, Hemodialysis, PEG, Vasopressors, Tube feedings/TPN      Additional Data Reviewed: CBC    Component Value Date/Time   WBC 12.8* 03/01/2016 0242   WBC 12.6* 11/13/2015 2009   RBC 4.01* 03/01/2016 0242   RBC 3.99* 11/13/2015 2009   HGB 10.5* 03/01/2016 0242   HGB 11.1* 11/13/2015 2009   HCT 33.0* 03/01/2016 0242   HCT 33.6* 11/13/2015 2009   PLT 245 03/01/2016 0242   MCV 82.3 03/01/2016 0242   MCV 84.2 11/13/2015 2009   MCH 26.2 03/01/2016 0242   MCH 27.8 11/13/2015 2009   MCHC 31.8 03/01/2016 0242   MCHC 33.1 11/13/2015 2009   RDW 17.0* 03/01/2016 0242   LYMPHSABS 0.7 02/03/2016 0914   MONOABS 0.2 02/03/2016 0914   EOSABS 0.0 02/03/2016 0914   BASOSABS 0.0 02/03/2016 0914    CMP     Component Value Date/Time   NA 123* 03/01/2016 0242   K 4.9 03/01/2016 0242   CL 81* 03/01/2016 0242   CO2 23 03/01/2016 0242   GLUCOSE 138* 03/01/2016 0242   BUN 81* 03/01/2016 0242   CREATININE 4.99* 03/01/2016 0242   CREATININE 3.04* 11/26/2015 1516   CALCIUM 9.3 03/01/2016 0242   PROT 7.9 02/19/2016 1100   ALBUMIN 4.0 02/19/2016 1100   AST 55* 02/19/2016 1100   ALT 26 02/19/2016 1100   ALKPHOS 69 02/19/2016 1100   BILITOT 1.1 02/19/2016 1100   GFRNONAA 11* 03/01/2016 0242   GFRNONAA 25* 09/04/2015 1050   GFRAA 13* 03/01/2016 0242   GFRAA 29* 09/04/2015  1050       Problem List:  Patient Active Problem List   Diagnosis Date Noted  . Cardiogenic shock (HCC)   . Hyperkalemia   . Chronic atrial fibrillation (HCC)   . Palliative care encounter   . Acute on chronic systolic CHF (congestive heart failure), NYHA class 4 (HCC) 02/27/2016  . Anticoagulated on Coumadin 02/18/2016  . COPD exacerbation (HCC) 02/03/2016  . Acute respiratory failure with hypoxia (HCC) 02/03/2016  . Renal failure (ARF), acute on chronic (HCC) 02/03/2016  . Elevated troponin 01/13/2016  . Acute on chronic systolic  heart failure (HCC) 16/08/9603  . Morbid obesity due to excess calories (HCC) 10/06/2015  . Snoring 10/06/2015  . Hypersomnia with sleep apnea 10/06/2015  . CHF (congestive heart failure), NYHA class I (HCC) 10/06/2015  . Chronic pain syndrome 09/29/2015  . CKD (chronic kidney disease), stage IV (HCC) 09/15/2015  . Type 2 diabetes mellitus with stage 4 chronic kidney disease, without long-term current use of insulin (HCC)   . Acute on chronic systolic CHF (congestive heart failure) (HCC)   . AKI (acute kidney injury) (HCC)   . Acute kidney injury (HCC) 08/27/2015  . Arterial hypotension   . Hip pain 07/23/2015  . Dyspnea   . Acute renal failure superimposed on stage 3 chronic kidney disease (HCC) 07/01/2015  . Herpes ocular   . Facial rash 06/19/2015  . Essential hypertension 06/19/2015  . Chronic anemia 06/19/2015  . Non-ischemic cardiomyopathy- EF 15% echo Aug 2015 11/24/2014  . Chronic anticoagulation 11/24/2014  . S/P left THA, AA 09/12/2014  . Gout 04/29/2014  . Ocular herpes   . Neuropathy (HCC)   . OSA (obstructive sleep apnea) 07/09/2013  . 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 PAF (paroxysmal atrial fibrillation) (HCC) 07/03/2013  . Hypertension   . COPD (chronic obstructive pulmonary disease) (HCC)   . CKD (chronic kidney disease), stage III   . Polysubstance abuse      Palliative Care Assessment & Plan    1.Code Status: DNR   2. Goals of Care/Additional Recommendations:  Comfort care.  Will discontinue the dobutamine drip at discharge from the hospital.  Will ask chaplain to visit.  Patient follows Buddhism.  3. Symptom Management:      1.Added Tussionex for cough      2. Added ambien at patient's request for sleep      3.  Morphine IV PRN dyspnea or moderate pain      4.  Ativan for anxiety.  4. Palliative Prophylaxis:   Bowel Regimen and Frequent Pain Assessment  5. Prognosis: Hours - Days  6. Discharge Planning:  Hospice facility   Care  plan was discussed with patient.  Thank you for allowing the Palliative Medicine Team to assist in the care of this patient.   Time In: 11:45 Time Out: 12:15 Total Time 25 min. Prolonged Time Billed  no        Stephani Police, PA-C  03/01/2016, 1:59 PM  Please contact Palliative Medicine Team phone at [phone] for questions and concerns.

## 2016-03-01 NOTE — Progress Notes (Signed)
Advanced Heart Failure Rounding Note   Subjective:      IV amio started for chronic AF with RVR over the weekend.   Yesterday went into worsening shock and renal failure. Initially told me he wanted to go back on dobutamine but then told Palliatie Care team he didn't want it. Later in night nurse asked him about it and he decided to start dobutamine.   Now on dobutamine at 5. Weight up. Renal function worse - creatinine now 4.99. Couldn't sleep all night.    Objective:   Weight Range:  Vital Signs:   Temp:  [97.3 F (36.3 C)-98 F (36.7 C)] 98 F (36.7 C) (04/10 0235) Pulse Rate:  [64-115] 107 (04/10 0345) Resp:  [10-23] 14 (04/10 0345) BP: (93-116)/(70-94) 108/70 mmHg (04/10 0345) SpO2:  [93 %-100 %] 96 % (04/10 0345) FiO2 (%):  [98 %] 98 % (04/09 1324) Weight:  [100.6 kg (221 lb 12.5 oz)] 100.6 kg (221 lb 12.5 oz) (04/10 0433) Last BM Date: 02/28/16  Weight change: Filed Weights   02/28/16 0345 02/29/16 0355 03/01/16 0433  Weight: 96.2 kg (212 lb 1.3 oz) 98 kg (216 lb 0.8 oz) 100.6 kg (221 lb 12.5 oz)    Intake/Output:   Intake/Output Summary (Last 24 hours) at 03/01/16 0649 Last data filed at 03/01/16 0600  Gross per 24 hour  Intake 1077.28 ml  Output      0 ml  Net 1077.28 ml     Physical Exam: General: Obese, pleasant HEENT: normal Neck: supple. JVP to jaw Carotids 2+ bilat; no bruits. No thyromegaly or nodule noted. Cor: PMI nondisplaced. irregularly irregular.  Lungs: clear Abdomen: Obese, soft, NT, non distended, no HSM. No bruits or masses. +BS  Extremities: no cyanosis, clubbing, rash, tr  edema. cool Neuro: alert & orientedx3 but a bit lethargic at times cranial nerves grossly intact. moves all 4 extremities w/o difficulty. Affect pleasant  Telemetry:  AF 70-80s  Labs: Basic Metabolic Panel:  Recent Labs Lab 02/27/16 0258 02/28/16 0338 02/29/16 0332 02/29/16 1138 03/01/16 0242  NA 137 135 128* 126* 123*  K 2.9* 3.4* 6.0* 5.5* 4.9   CL 88* 90* 85* 84* 81*  CO2 30 30 21* 17* 23  GLUCOSE 91 141* 113* 169* 138*  BUN 52* 47* 60* 67* 81*  CREATININE 2.22* 2.24* 3.68* 4.34* 4.99*  CALCIUM 10.3 9.5 9.4 9.1 9.3    Liver Function Tests: No results for input(s): AST, ALT, ALKPHOS, BILITOT, PROT, ALBUMIN in the last 168 hours. No results for input(s): LIPASE, AMYLASE in the last 168 hours. No results for input(s): AMMONIA in the last 168 hours.  CBC:  Recent Labs Lab 02/27/16 0258 03/01/16 0242  WBC 7.3 12.8*  HGB 12.7* 10.5*  HCT 40.1 33.0*  MCV 84.4 82.3  PLT 337 245    Cardiac Enzymes:  Recent Labs Lab 02/27/16 0258  TROPONINI 0.14*    BNP: BNP (last 3 results)  Recent Labs  02/03/16 0115 02/19/16 1100 02/27/16 0258  BNP 750.3* 400.3* 864.7*    ProBNP (last 3 results) No results for input(s): PROBNP in the last 8760 hours.    Other results:  Imaging: No results found.   Medications:     Scheduled Medications: . furosemide  80 mg Intravenous BID  . hydrALAZINE  100 mg Oral TID  . ipratropium-albuterol  3 mL Nebulization TID  . isosorbide mononitrate  120 mg Oral Daily  .  morphine injection  2 mg Intravenous Once  . polyethylene  glycol  17 g Oral Daily  . sodium chloride flush  3 mL Intravenous Q12H  . tiotropium  18 mcg Inhalation Daily    Infusions: . amiodarone 30 mg/hr (03/01/16 0600)  . DOBUTamine 5 mcg/kg/min (03/01/16 0600)    PRN Medications: sodium chloride, acetaminophen, albuterol, baclofen, diclofenac, ondansetron (ZOFRAN) IV, oxyCODONE, senna, sodium chloride flush   Assessment:   1. Acute respiratory failure with hypoxia 2. Acute on chronic systolic CHF, end-stage -> cardiogenic shock 3. Acute on chronic renal failure stage IV 4. Chronic Afib now with RVR 5. HTN 6. DM2 7. OSA -  8. Hyponatremia. 9. hyperkalemia 10 DNR/DNI  Plan/Discussion:     He has recurrent cardiogenic shock with worsening renal function, cool extremities. Now back on  dobutamine after discussion with nurse.  I again had a long talk with him and he really wants comfort. Would like to go to [facility] but now wants to stay on dobutamine until her leaves the hospital. I told him that at [facility] point dobutamine would likely not help. Volume status much worse. Will increase lasix to 120 bid. Await Palliative care visit this am to help with decision making.   On amio for AF with RVR. Rate improved.   Length of Stay: 3 Arvilla Meres MD 03/01/2016, 6:49 AM  Advanced Heart Failure Team Pager [phone] (M-F; 7a - 4p)  Please contact [facility] for night-coverage after hours (4p -7a ) and weekends on [email or web address]

## 2016-03-01 NOTE — Progress Notes (Signed)
PROGRESS NOTE  Frank Davidson HKG:[id number] DOB: 04-28-1951 DOA: 02/27/2016 PCP: Abbe Amsterdam, MD  Assessment/Plan: Acute on chronic systolic CHF NYHA class 4 - -dobutamine restarted after patient refused previously -has continued to decompensate-- will need inpatient hospice-- social work consult placed -lasix per HF team  Chronic anticoagulation - coumadin per pharm consult  CKD stage 4  -Cr increasing with diuresis  DM2 - appears diet controlled at this time  Atrial fibrillation -cards added amio- rate better  hyperkalemia -d/c'd PO kdur until repeat back (hemolyzed)  Code Status: dnr Family Communication: no family at bedside Disposition Plan:    Consultants:  chf team  Palliative care  Procedures:      HPI/Subjective: Has not urinated this AM  Objective: Filed Vitals:   03/01/16 0700 03/01/16 0711  BP:  117/76  Pulse:  73  Temp: 97.3 F (36.3 C)   Resp:  10    Intake/Output Summary (Last 24 hours) at 03/01/16 1002 Last data filed at 03/01/16 0600  Gross per 24 hour  Intake 890.48 ml  Output      0 ml  Net 890.48 ml   Filed Weights   02/28/16 0345 02/29/16 0355 03/01/16 0433  Weight: 96.2 kg (212 lb 1.3 oz) 98 kg (216 lb 0.8 oz) 100.6 kg (221 lb 12.5 oz)    Exam:   General:  Awake, NAD  Cardiovascular: irr  Respiratory: diminished  Abdomen: obese  Musculoskeletal: +edema   Data Reviewed: Basic Metabolic Panel:  Recent Labs Lab 02/27/16 0258 02/28/16 0338 02/29/16 0332 02/29/16 1138 03/01/16 0242  NA 137 135 128* 126* 123*  K 2.9* 3.4* 6.0* 5.5* 4.9  CL 88* 90* 85* 84* 81*  CO2 30 30 21* 17* 23  GLUCOSE 91 141* 113* 169* 138*  BUN 52* 47* 60* 67* 81*  CREATININE 2.22* 2.24* 3.68* 4.34* 4.99*  CALCIUM 10.3 9.5 9.4 9.1 9.3   Liver Function Tests: No results for input(s): AST, ALT, ALKPHOS, BILITOT, PROT, ALBUMIN in the last 168 hours. No results for input(s): LIPASE, AMYLASE in the last 168 hours. No results  for input(s): AMMONIA in the last 168 hours. CBC:  Recent Labs Lab 02/27/16 0258 03/01/16 0242  WBC 7.3 12.8*  HGB 12.7* 10.5*  HCT 40.1 33.0*  MCV 84.4 82.3  PLT 337 245   Cardiac Enzymes:  Recent Labs Lab 02/27/16 0258  TROPONINI 0.14*   BNP (last 3 results)  Recent Labs  02/03/16 0115 02/19/16 1100 02/27/16 0258  BNP 750.3* 400.3* 864.7*    ProBNP (last 3 results) No results for input(s): PROBNP in the last 8760 hours.  CBG:  Recent Labs Lab 02/29/16 0752 02/29/16 1215 02/29/16 1700 02/29/16 2111 03/01/16 0741  GLUCAP 74 171* 204* 126* 152*    No results found for this or any previous visit (from the past 240 hour(s)).   Studies: No results found.  Scheduled Meds: . furosemide  160 mg Intravenous BID  . hydrALAZINE  100 mg Oral TID  . ipratropium-albuterol  3 mL Nebulization TID  . isosorbide mononitrate  120 mg Oral Daily  .  morphine injection  2 mg Intravenous Once  . polyethylene glycol  17 g Oral Daily  . sodium chloride flush  3 mL Intravenous Q12H  . tiotropium  18 mcg Inhalation Daily   Continuous Infusions: . amiodarone 30 mg/hr (03/01/16 0936)  . DOBUTamine 5 mcg/kg/min (03/01/16 0600)   Antibiotics Given (last 72 hours)    None      Principal Problem:  Acute on chronic systolic CHF (congestive heart failure), NYHA class 4 (HCC) Active Problems:   Chronic anticoagulation   Type 2 diabetes mellitus with stage 4 chronic kidney disease, without long-term current use of insulin (HCC)   CKD (chronic kidney disease), stage IV (HCC)   Palliative care encounter   Cardiogenic shock (HCC)   Hyperkalemia   Chronic atrial fibrillation (HCC)    Time spent: 25 min    [facility]  [facility] Pager [phone] If 7PM-7AM, please contact night-coverage at [email or web address], password [facility] 03/01/2016, 10:02 AM  LOS: 3 days

## 2016-03-01 NOTE — Progress Notes (Signed)
Foley placed for urinary retention-- > 1L returned-- some hematuria after, suspect from expanded bladder being decompressed.  Flush PRN- monitor for clots  Marlin Canary DO

## 2016-03-01 NOTE — Consult Note (Signed)
   [facility] Inpatient Consult   03/01/2016  Frank Davidson 1951/01/13 [id number] Patient was active prior to admission with [facility] Care Management.  RNCM Community nurse is aware of his admission.  Chart review indicates that the patient has chosen to pursue residential hospice placement. Given this choice he will have full case management services through Hospice and we will close this case for [facility] community case management services.  For questions, please contact: Charlesetta Shanks, RN BSN CCM [facility]  [phone] business mobile phone Toll free office [phone]

## 2016-03-01 NOTE — Clinical Social Work Note (Addendum)
Per Palliative Care and RNCM, patient planning to return home with hospice services.   Clinical Social Worker will sign off for now as social work intervention is no longer needed. Please consult [place] again if new need arises.  ADDENDUM: 11:03 AM-Patient requiring residential hospice and prefers [facility]. Currently [facility] does not any beds available. Patient is agreeable to CSW making referral to [facility].   CSW remains available as needed.   Derenda Fennel, MSW, LCSWA [phone] 03/01/2016 10:58 AM

## 2016-03-01 NOTE — Care Management Note (Signed)
Case Management Note  Patient Details  Name: Frank Davidson MRN: [id number] Date of Birth: 1951-06-17  Subjective/Objective:  NCM received information in progression that patient will be going to Residential Hospice,  Notes in chart say home with hospice, NCM spoke with patient and RN , who states patient will be going to residential per  MD .  Patient stated he wants to go to [facility], NCM informed him that the CSW states they do not have any beds would he consider going to [place], he said yes.  NCM informed CSW of this information.                  Action/Plan:   Expected Discharge Date:                  Expected Discharge Plan:  Hospice Medical Facility  In-House Referral:  Clinical Social Work  Discharge planning Services  CM Consult  Post Acute Care Choice:    Choice offered to:  Patient  DME Arranged:    DME Agency:  NA  HH Arranged:  NA HH Agency:  Other - See comment  Status of Service:  Completed, signed off  Medicare Important Message Given:    Date Medicare IM Given:    Medicare IM give by:    Date Additional Medicare IM Given:    Additional Medicare Important Message give by:     If discussed at Long Length of Stay Meetings, dates discussed:    Additional Comments:  [name], RN 03/01/2016, 11:03 AM

## 2016-03-01 NOTE — Progress Notes (Signed)
   03/01/16 1459  Clinical Encounter Type  Visited With Patient;Health care provider  Visit Type Initial;Spiritual support;Patient actively dying  Referral From Palliative care team  Stress Factors  Patient Stress Factors Health changes   Chaplain responded to a request from palliative care to visit the patient. Chaplain offered support through empathic listening, facilitating life review, and reviewing sacred texts. Chaplain support available as needed.   Alda Ponder, Chaplain 03/01/2016 3:00 PM

## 2016-03-01 NOTE — Clinical Social Work Note (Signed)
Clinical Social Worker has made referral to [facility].   Derenda Fennel, MSW, LCSWA [phone] 03/01/2016 11:05 AM

## 2016-03-01 NOTE — Progress Notes (Signed)
[facility]  Patient Status:  Active pt with [facility] up to this admission  [facility] is providing the following services: HHRN and [facility] for home Milrinone.  I met with pt on 02/27/16 to pickup his CADD [employer] pump for home milrinone.  Call to home and spoke with family member, Neoma Laming, and arranged with [facility] team for pump pick up on Monday.  [facility] will transition pt to Hospice services.    If patient discharges after hours, please call [phone].   Larry Sierras 03/01/2016, 12:11 PM

## 2016-03-01 NOTE — Care Management Important Message (Signed)
Important Message  Patient Details  Name: ZVI FURSE MRN: [id number] Date of Birth: 20-Nov-1951   Medicare Important Message Given:  Yes    Kyla Balzarine 03/01/2016, 12:39 PM

## 2016-03-02 LAB — GLUCOSE, CAPILLARY: Glucose-Capillary: 140 mg/dL — ABNORMAL HIGH (ref 65–99)

## 2016-03-02 MED ORDER — PREGABALIN 75 MG PO CAPS: 75.0000 mg | ORAL_CAPSULE | Freq: Every day | ORAL | Status: AC

## 2016-03-02 MED ORDER — ZOLPIDEM TARTRATE 5 MG PO TABS: 5.0000 mg | ORAL_TABLET | Freq: Every day | ORAL | Status: AC

## 2016-03-02 MED ORDER — FUROSEMIDE 10 MG/ML IJ SOLN: 160.0000 mg | Freq: Two times a day (BID) | INTRAVENOUS | Status: AC

## 2016-03-02 MED ORDER — LORAZEPAM 2 MG/ML IJ SOLN: 1.0000 mg | Freq: Four times a day (QID) | INTRAMUSCULAR | Status: AC | PRN

## 2016-03-02 MED ORDER — OXYCODONE HCL 5 MG PO TABS: 5.0000 mg | ORAL_TABLET | ORAL | Status: AC | PRN

## 2016-03-02 MED ORDER — CAMPHOR-MENTHOL 0.5-0.5 % EX LOTN
TOPICAL_LOTION | CUTANEOUS | Status: AC | PRN
Start: 2016-03-02 — End: ?

## 2016-03-02 NOTE — Clinical Social Work Note (Signed)
Clinical Social Work Assessment  Patient Details  Name: Frank Davidson MRN: [id number] Date of Birth: [birth year]  Date of referral:  03/01/16               Reason for consult:  Facility Placement, Discharge Planning                Permission sought to share information with:  Family Supports, Magazine features editor, Case Manager Permission granted to share information::  Yes, Verbal Permission Granted  Name::      Control and instrumentation engineer)  Agency::   (SNF)  Relationship::   (Daughter )  Contact Information:   [phone])  Housing/Transportation Living arrangements for the past 2 months:  Single Family Home Source of Information:  Patient, Adult Children Patient Interpreter Needed:  None Criminal Activity/Legal Involvement Pertinent to Current Situation/Hospitalization:  No - Comment as needed Significant Relationships:  Adult Children Lives with:  Self Do you feel safe going back to the place where you live?  No Need for family participation in patient care:  Yes (Comment)  Care giving concerns:  Consult received for residential hospice placement.    Social Worker assessment / plan:  Patient and pt's dtr, Frank Davidson agreeable to residential hospice placement at [facility]. Patient and family understands that [facility] does not have any beds available. Emotional support extended to family. No further concerns reported at this time.   Clinical Social Worker facilitated patient discharge including contacting patient family and facility to confirm patient discharge plans.  Clinical information faxed to facility and family agreeable with plan.  CSW arranged ambulance transport via [facility] to [facility].  RN to call report prior to discharge.  Employment status:  Retired Database administrator PT Recommendations:  Not assessed at this time Information / Referral to community resources:   [facility] )  Patient/Family's Response to  care:  Pt and family agreeable to hospice placement. Pt's dtr supportive and involved in care. Pt's dtr pleasant and appreciated Child psychotherapist.   Patient/Family's Understanding of and Emotional Response to Diagnosis, Current Treatment, and Prognosis: Patient and family aware of prognosis and goals of care.   Emotional Assessment Appearance:  Appears stated age Attitude/Demeanor/Rapport:   (Pleasant ) Affect (typically observed):  Accepting, Appropriate Orientation:  Oriented to  Time, Oriented to Place, Oriented to Self, Oriented to Situation Alcohol / Substance use:  Not Applicable Psych involvement (Current and /or in the community):  No (Comment)  Discharge Needs  Concerns to be addressed:  Care Coordination, Basic Needs, Grief and Loss Concerns Readmission within the last 30 days:  No Current discharge risk:  Terminally ill Barriers to Discharge:  No Barriers Identified   Clinical Social Worker will sign off for now as social work intervention is no longer needed. Please consult [place] again if new need arises.   Derenda Fennel, MSW, LCSWA [phone] 03/02/2016 11:14 AM

## 2016-03-02 NOTE — Progress Notes (Signed)
Discharge Note. Pt's IV was saline locked prior to transport and left via non-emergent ambulance to Hospice in [facility]. Patient thanked staff for taking care of him. Attempted to call pt's sister multiple times and called other relatives on list to gain another number for his sister. Sister called back and was told that patient is en route to hospice. Pt's sister, Gavin Pound, thanked staff for taking care of him as well.

## 2016-03-02 NOTE — Discharge Summary (Signed)
Physician Discharge Summary  Frank Davidson OMV:[id number] DOB: 1951-06-19 DOA: 02/27/2016  PCP: Abbe Amsterdam, MD  Admit date: 02/27/2016 Discharge date: 03/02/2016  Time spent: 35 minutes  Recommendations for Outpatient Follow-up:  1. To inpt hospice   Discharge Diagnoses:  Principal Problem:   Acute on chronic systolic CHF (congestive heart failure), NYHA class 4 (HCC) Active Problems:   Chronic anticoagulation   Type 2 diabetes mellitus with stage 4 chronic kidney disease, without long-term current use of insulin (HCC)   CKD (chronic kidney disease), stage IV (HCC)   Palliative care encounter   Cardiogenic shock (HCC)   Hyperkalemia   Chronic atrial fibrillation (HCC)   Cough   Insomnia   Discharge Condition: terminal  Diet recommendation: regular  Filed Weights   02/29/16 0355 03/01/16 0433 03/02/16 0302  Weight: 98 kg (216 lb 0.8 oz) 100.6 kg (221 lb 12.5 oz) 102.6 kg (226 lb 3.1 oz)    History of present illness:  Frank Davidson is a 65 y.o. male with extensive history of NICM and systolic CHF. Patient was recently admitted a couple of weeks ago for CHF exacerbation. Ultimately he was seen by the heart failure service started on and discharged on a continuous dobutamine infusion. Patient had been doing okay on the infusion, but felt that the infusion was making him more fatigued and requested that it be stopped. Yesterday at 3 pm the infusion was stopped. At around 7pm he began to develop severe SOB, DOE, orthopnea. Patient is brought in to the ED.  Hospital Course:  Acute on chronic systolic CHF NYHA class 4  With cardiogenic shock -has continued to decompensate-- will need inpatient hospice-- social work consult placed -lasix   Chronic anticoagulation -stop coumadin  CKD stage 4  -Cr increased with diuresis  DM2 - -on no meds -diet controlled  Atrial fibrillation -cards added amio- rate better  hyperkalemia -Kdur  stopped  Procedures:    Consultations:  Palliative care  HF team  Discharge Exam: Filed Vitals:   03/02/16 0302 03/02/16 0727  BP: 103/70   Pulse: 79   Temp: 97.5 F (36.4 C) 96.5 F (35.8 C)  Resp: 18      Discharge Instructions   Discharge Instructions    Diet - low sodium heart healthy    Complete by:  As directed      Diet Carb Modified    Complete by:  As directed      Increase activity slowly    Complete by:  As directed           Current Discharge Medication List    START taking these medications   Details  camphor-menthol (SARNA) lotion Apply topically as needed for itching. Qty: 222 mL, Refills: 0    furosemide 160 mg in dextrose 5 % 50 mL Inject 160 mg into the vein 2 (two) times daily.    LORazepam (ATIVAN) 2 MG/ML injection Inject 0.5 mLs (1 mg total) into the vein every 6 (six) hours as needed for anxiety or sedation. Qty: 1 mL, Refills: 0    oxyCODONE (OXY IR/ROXICODONE) 5 MG immediate release tablet Take 1-2 tablets (5-10 mg total) by mouth every 4 (four) hours as needed for moderate pain (dyspnea). Qty: 30 tablet, Refills: 0    zolpidem (AMBIEN) 5 MG tablet Take 1 tablet (5 mg total) by mouth at bedtime. Qty: 30 tablet, Refills: 0      CONTINUE these medications which have CHANGED   Details  pregabalin (LYRICA) 75 MG  capsule Take 1 capsule (75 mg total) by mouth daily.      CONTINUE these medications which have NOT CHANGED   Details  albuterol (PROVENTIL HFA;VENTOLIN HFA) 108 (90 BASE) MCG/ACT inhaler Inhale 2 puffs into the lungs every 6 (six) hours as needed for wheezing or shortness of breath. Qty: 1 Inhaler, Refills: 1   Associated Diagnoses: Wheezing    albuterol (PROVENTIL) (2.5 MG/3ML) 0.083% nebulizer solution Take 3 mLs (2.5 mg total) by nebulization every 4 (four) hours as needed for wheezing or shortness of breath. Qty: 150 vial, Refills: 5    baclofen (LIORESAL) 10 MG tablet TAKE 1 TABLET EVERY 8 HOURS AS NEEDED FOR  MUSCLE SPASM(S) Qty: 90 tablet, Refills: 4    diclofenac (FLECTOR) 1.3 % PTCH Place 1 patch onto the skin 2 (two) times daily as needed (pain). Qty: 180 patch, Refills: 3   Associated Diagnoses: Chronic pain syndrome    docusate sodium 100 MG CAPS Take 100 mg by mouth 2 (two) times daily. Qty: 10 capsule, Refills: 0    hydrALAZINE (APRESOLINE) 100 MG tablet Take 1 tablet (100 mg total) by mouth 3 (three) times daily. Qty: 90 tablet, Refills: 6    ipratropium-albuterol (DUONEB) 0.5-2.5 (3) MG/3ML SOLN Take 3 mLs by nebulization 4 (four) times daily. Qty: 360 mL, Refills: 11    isosorbide mononitrate (IMDUR) 120 MG 24 hr tablet Take 1 tablet (120 mg total) by mouth daily. Qty: 30 tablet, Refills: 6    Multiple Vitamins-Minerals (MULTIVITAMIN WITH MINERALS) tablet Take 1 tablet by mouth daily.    omega-3 acid ethyl esters (LOVAZA) 1 G capsule Take 1 g by mouth daily.    polyethylene glycol powder (GLYCOLAX/MIRALAX) powder MIX 1 CAPFUL (17GM)  IN  LIQUID  AND  DRINK EVERY DAY AS DIRECTED Qty: 1530 g, Refills: 3    tiotropium (SPIRIVA HANDIHALER) 18 MCG inhalation capsule Place 1 capsule (18 mcg total) into inhaler and inhale daily. Qty: 90 capsule, Refills: 2   Associated Diagnoses: Other emphysema (HCC)      STOP taking these medications     allopurinol (ZYLOPRIM) 100 MG tablet      metolazone (ZAROXOLYN) 2.5 MG tablet      oxyCODONE-acetaminophen (PERCOCET) 10-325 MG tablet      potassium chloride SA (K-DUR,KLOR-CON) 20 MEQ tablet      torsemide (DEMADEX) 20 MG tablet      warfarin (COUMADIN) 10 MG tablet      DOBUTamine (DOBUTREX) 4-5 MG/ML-% infusion        Allergies  Allergen Reactions  . Corlanor [Ivabradine] Palpitations and Other (See Comments)    Kidney and heart problems, syncope  . Entresto [Sacubitril-Valsartan]     Syncope  . Januvia [Sitagliptin] Other (See Comments)    "Almost killed me"   . Lunesta [Eszopiclone] Palpitations and Other (See  Comments)    Kidney and heart problems, syncope  . Actos [Pioglitazone] Other (See Comments)    Caused blood in urine and fluid retention    Follow-up Information    Follow up with [facility].   Specialty:  Hospice Services   Why:  home hospice agency   Contact information:   [address] [place] [address] [phone]        The results of significant diagnostics from this hospitalization (including imaging, microbiology, ancillary and laboratory) are listed below for reference.    Significant Diagnostic Studies: Ct Head Wo Contrast  02/07/2016  CLINICAL DATA:  Called by nurse reporting that  patient is a little confused. He is on heparin and there is a concern for intracranial bleeding. EXAM: CT HEAD WITHOUT CONTRAST TECHNIQUE: Contiguous axial images were obtained from the base of the skull through the vertex without intravenous contrast. COMPARISON:  CT head 10/19/2014 FINDINGS: There is central and cortical atrophy. Periventricular white matter changes are consistent with small vessel disease. 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 Remote infarct involving the left frontal and left parietal white matter, stable in appearance. Remote lacunar infarct involving the left lentiform nuclei. There is no intra or extra-axial fluid collection or mass lesion. The basilar cisterns and ventricles have a normal appearance. There is no CT evidence for acute infarction or hemorrhage. Bone windows demonstrate no acute calvarial injury. There is moderate atherosclerosis of the internal carotid arteries. IMPRESSION: 1. Atrophy and small vessel disease. 2. Multiple remote and stable infarcts. 3. No evidence for acute  abnormality. Electronically Signed   By: Norva Pavlov M.D.   On: 02/07/2016 23:08   Dg Chest Portable 1 View  02/27/2016  CLINICAL DATA:  Dyspnea, 4 or 5 hours duration. Nonproductive cough. EXAM: PORTABLE CHEST 1 VIEW COMPARISON:   02/09/2016 FINDINGS: Is unchanged moderate cardiomegaly. No focal airspace consolidation. No large effusion. No pneumothorax. IMPRESSION: Cardiomegaly.  No focal consolidation or large effusion. Electronically Signed   By: Ellery Plunk M.D.   On: 02/27/2016 03:19   Dg Chest Port 1 View  02/09/2016  CLINICAL DATA:  Line placement. EXAM: PORTABLE CHEST 1 VIEW COMPARISON:  02/02/2016 FINDINGS: An left PICC catheter has been placed with tip over the lower SVC region. No pneumothorax. Pain right jugular central venous catheter is in place with tip over the upper SVC. No pneumothorax. Shallow inspiration. Heart size and pulmonary vascularity are normal for technique. Mild linear atelectasis in the mid lungs. No focal consolidation. No blunting of costophrenic angles. Mediastinal contours appear intact. IMPRESSION: Left PICC catheter with tip over the lower SVC region. Right central venous catheter with tip over the upper SVC region. No pneumothorax. Electronically Signed   By: Burman Nieves M.D.   On: 02/09/2016 20:05   Dg Chest Port 1 View  02/02/2016  CLINICAL DATA:  Shortness of breath EXAM: PORTABLE CHEST 1 VIEW COMPARISON:  01/13/2016 FINDINGS: Chronic cardiomegaly. Stable mediastinal contours. Chronic bilateral lung scarring. There is no edema, consolidation, effusion, or pneumothorax. IMPRESSION: 1. No acute finding. 2. Chronic cardiomegaly and lung scarring. Electronically Signed   By: Marnee Spring M.D.   On: 02/02/2016 23:58    Microbiology: No results found for this or any previous visit (from the past 240 hour(s)).   Labs: Basic Metabolic Panel:  Recent Labs Lab 02/27/16 0258 02/28/16 0338 02/29/16 0332 02/29/16 1138 03/01/16 0242  NA 137 135 128* 126* 123*  K 2.9* 3.4* 6.0* 5.5* 4.9  CL 88* 90* 85* 84* 81*  CO2 30 30 21* 17* 23  GLUCOSE 91 141* 113* 169* 138*  BUN 52* 47* 60* 67* 81*  CREATININE 2.22* 2.24* 3.68* 4.34* 4.99*  CALCIUM 10.3 9.5 9.4 9.1 9.3   Liver  Function Tests: No results for input(s): AST, ALT, ALKPHOS, BILITOT, PROT, ALBUMIN in the last 168 hours. No results for input(s): LIPASE, AMYLASE in the last 168 hours. No results for input(s): AMMONIA in the last 168 hours. CBC:  Recent Labs Lab 02/27/16 0258 03/01/16 0242  WBC 7.3 12.8*  HGB 12.7* 10.5*  HCT 40.1 33.0*  MCV 84.4 82.3  PLT 337 245   Cardiac  Enzymes:  Recent Labs Lab 02/27/16 0258  TROPONINI 0.14*   BNP: BNP (last 3 results)  Recent Labs  02/03/16 0115 02/19/16 1100 02/27/16 0258  BNP 750.3* 400.3* 864.7*    ProBNP (last 3 results) No results for input(s): PROBNP in the last 8760 hours.  CBG:  Recent Labs Lab 02/29/16 2111 03/01/16 0741 03/01/16 1240 03/01/16 1713 03/02/16 0714  GLUCAP 126* 152* 171* 149* 140*       Signed:  Andren Bethea U Cedar Roseman DO.  [facility] 03/02/2016, 8:06 AM

## 2016-03-02 NOTE — Progress Notes (Signed)
Called report to [facility] and given to [employer]. Talked to social worker, Adria Devon, she stated that she had not called for transport but would be doing so in the next 15 minutes. Pt. Will be ready for transport once available.  Cathe Mons, RN

## 2016-03-03 ENCOUNTER — Encounter: Payer: Self-pay | Admitting: *Deleted

## 2016-03-03 ENCOUNTER — Other Ambulatory Visit: Payer: Self-pay | Admitting: *Deleted

## 2016-03-03 NOTE — Patient Outreach (Signed)
[facility]) Care Management  03/03/2016  Frank Davidson 06-09-1951 [id number]   Pt was admitted on 4/10 and discharged to [facility] [place], [place]). Pt now under another case management services and no longer eligible for [facility] services. Case will be closed and pt will be discharge today. Primary will be notified.  [name], RN Care Management Coordinator [facility] Main Office [phone]

## 2016-03-04 ENCOUNTER — Inpatient Hospital Stay (HOSPITAL_COMMUNITY): Admission: RE | Admit: 2016-03-04 | Payer: Commercial Managed Care - HMO | Source: Ambulatory Visit

## 2016-03-05 ENCOUNTER — Ambulatory Visit: Payer: Self-pay

## 2016-03-05 DIAGNOSIS — I480 Paroxysmal atrial fibrillation: Secondary | ICD-10-CM

## 2016-03-05 DIAGNOSIS — Z7901 Long term (current) use of anticoagulants: Secondary | ICD-10-CM

## 2016-03-05 DIAGNOSIS — I639 Cerebral infarction, unspecified: Secondary | ICD-10-CM

## 2016-03-16 NOTE — Progress Notes (Signed)
r 

## 2016-03-19 IMAGING — CR DG ABDOMEN ACUTE W/ 1V CHEST
3 series · 3 of 3 positions shown · non-contrast
Comparison: Chest:

CLINICAL DATA: 63-year-old male with abdominal pain, vomiting

EXAM:
ACUTE ABDOMEN SERIES (ABDOMEN 2 VIEW & CHEST 1 VIEW)

[w chest pa]
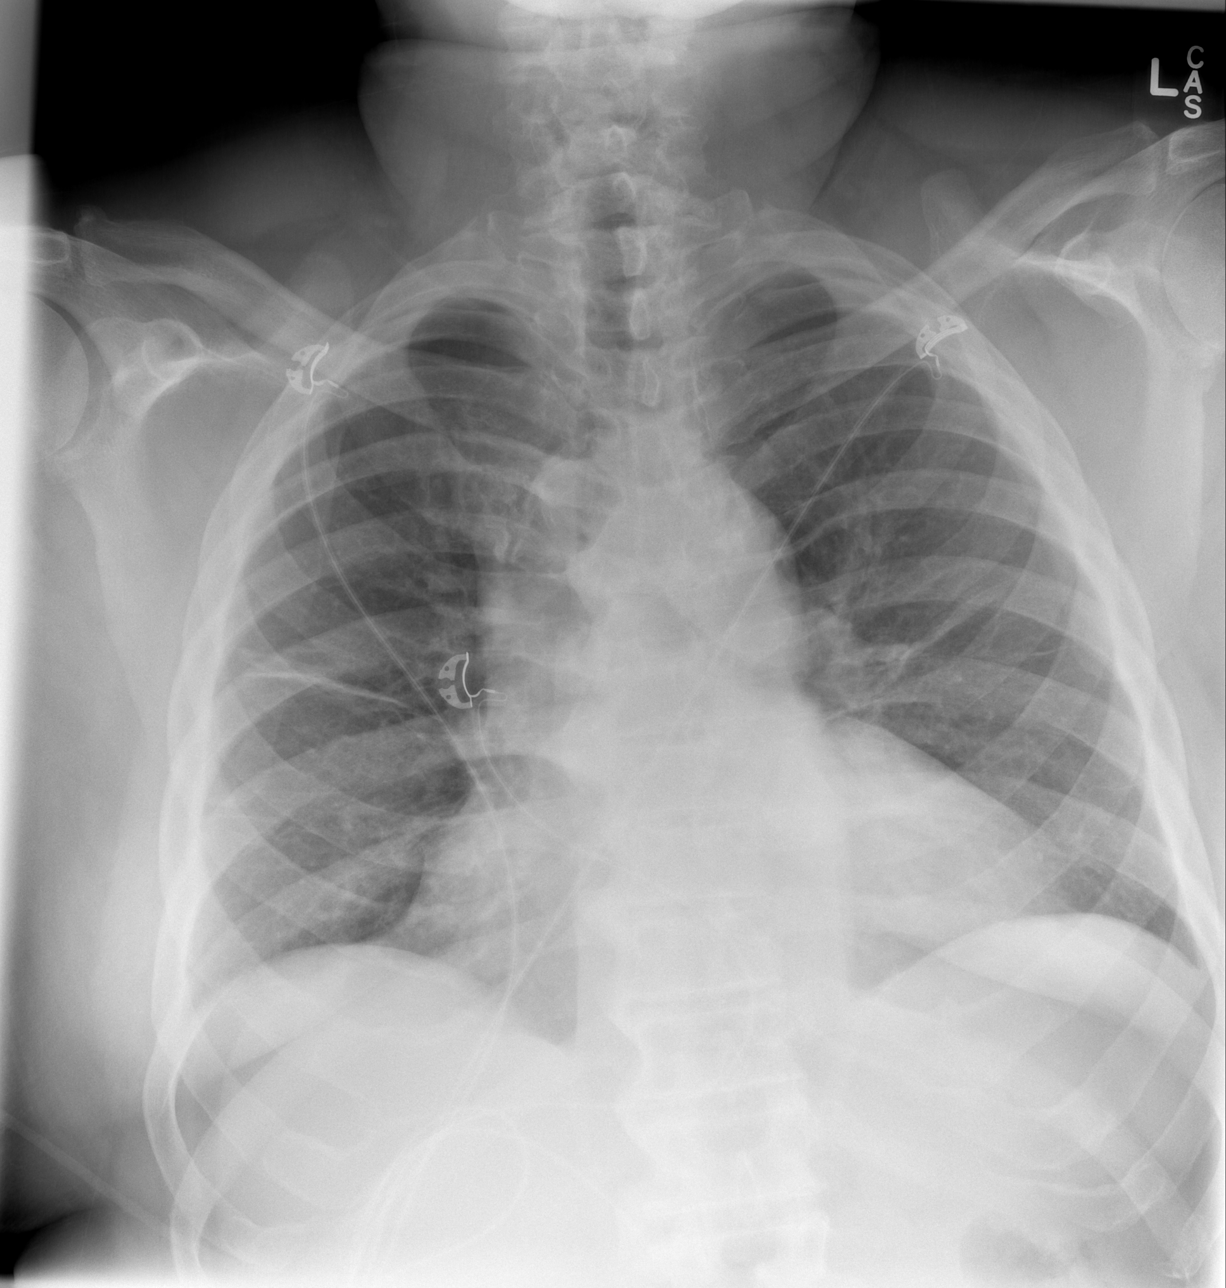

[w abdomen upright]
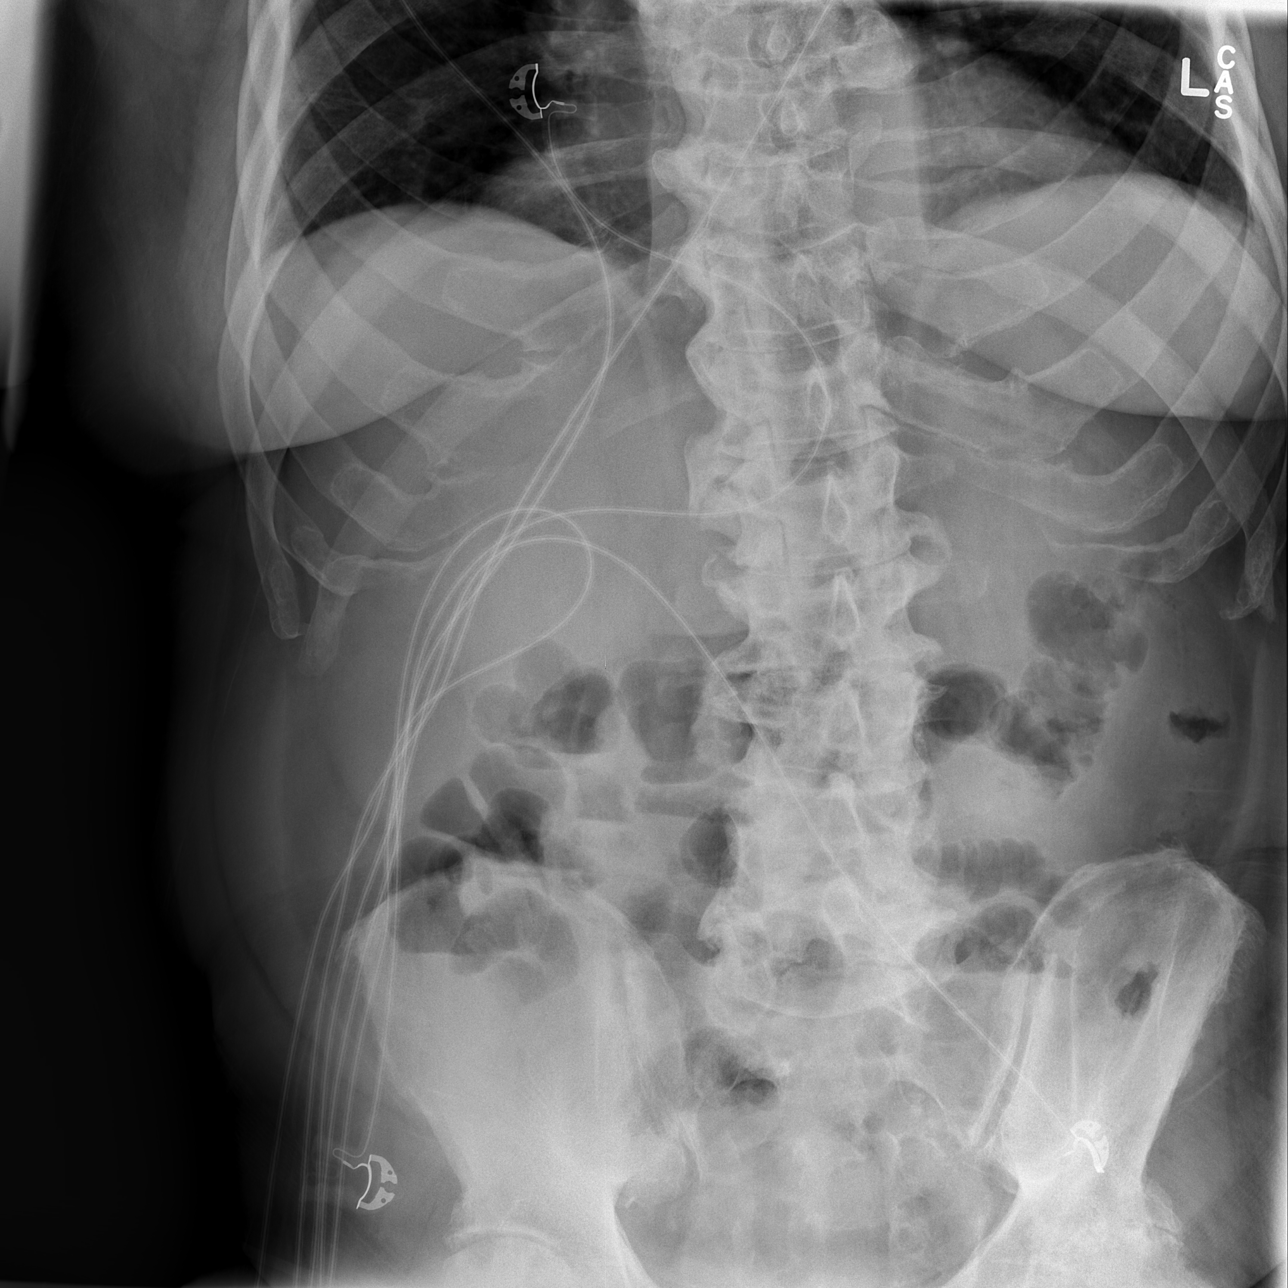

[t abdomen supine]
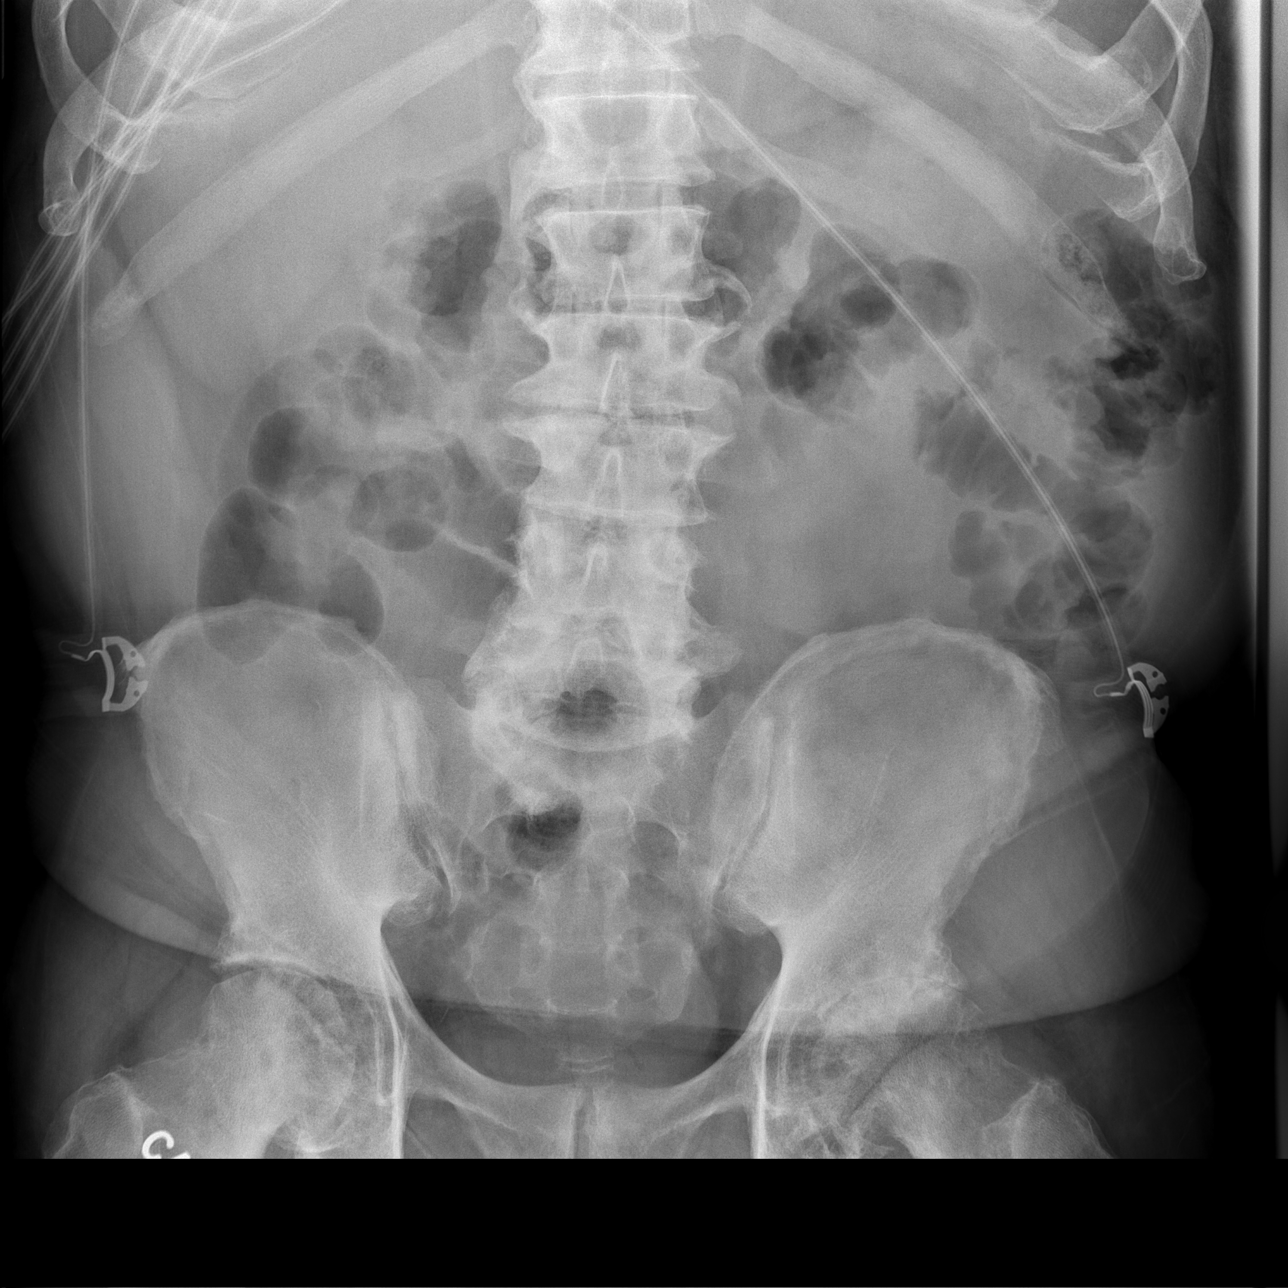

[3 of 3 positions shown; findings below may reference images not displayed]

Cardiomediastinal silhouette is unchanged in size and contour.
Atherosclerotic calcifications of the aortic arch.

Linear scarring/ atelectasis in the right mid lung, seen on prior
chest x-ray.

No confluent airspace disease.  No pneumothorax or pleural effusion.

Abdomen:

Gas within small bowel and colon without abnormal distention. No
significant stool burden. No unexpected calcifications of the
abdomen.

No visualized free air.

No acute bony abnormality identified.

Osteophyte production of the visualized spine with degenerative disc
disease worst spanning the levels of L2-L5.

Advanced bilateral hip osteoarthritis.
FINDINGS: Unremarkable bowel gas pattern.

No radiographic evidence of acute cardiopulmonary disease.

Advanced changes of bilateral hip osteoarthritis.

## 2016-03-22 DEATH — deceased

## 2016-03-23 ENCOUNTER — Encounter: Payer: Commercial Managed Care - HMO | Admitting: Cardiovascular Disease

## 2017-03-24 IMAGING — CR DG CHEST 1V
1 series · 1 of 1 positions shown · non-contrast
Comparison: 08/27/2015 at [DATE]

CLINICAL DATA: Central line

EXAM:
CHEST 1 VIEW

[AP]
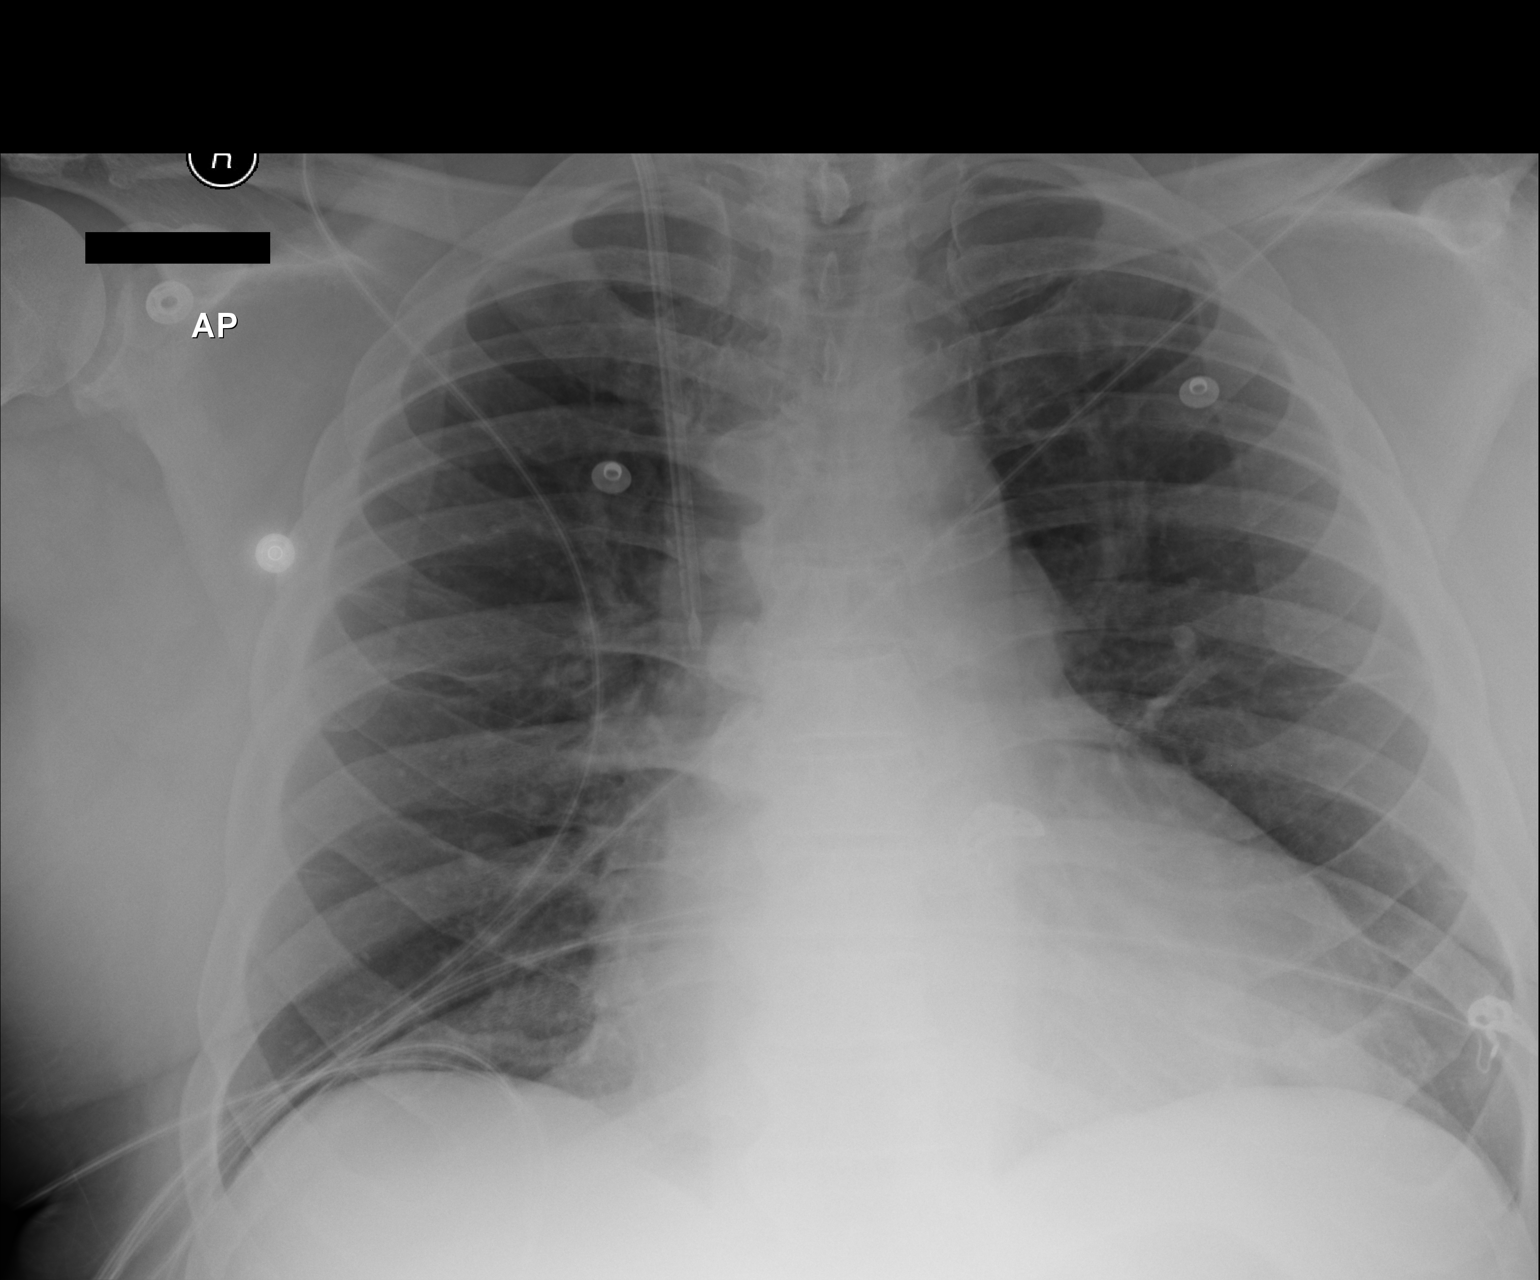

[1 of 1 positions shown; findings below may reference images not displayed]

FINDINGS: There is a new right jugular central line with tip in the SVC. There
is no pneumothorax. There is no other significant interval change.
Cardiomegaly and moderate linear opacities persist.
IMPRESSION: New right jugular central line.  No pneumothorax.

## 2017-03-24 IMAGING — DX DG CHEST 2V
2 series · 2 of 2 positions shown · non-contrast
Comparison: July 18, 2015

CLINICAL DATA: Shortness of breath for 3 days

EXAM:
CHEST  2 VIEW

[chest pa]
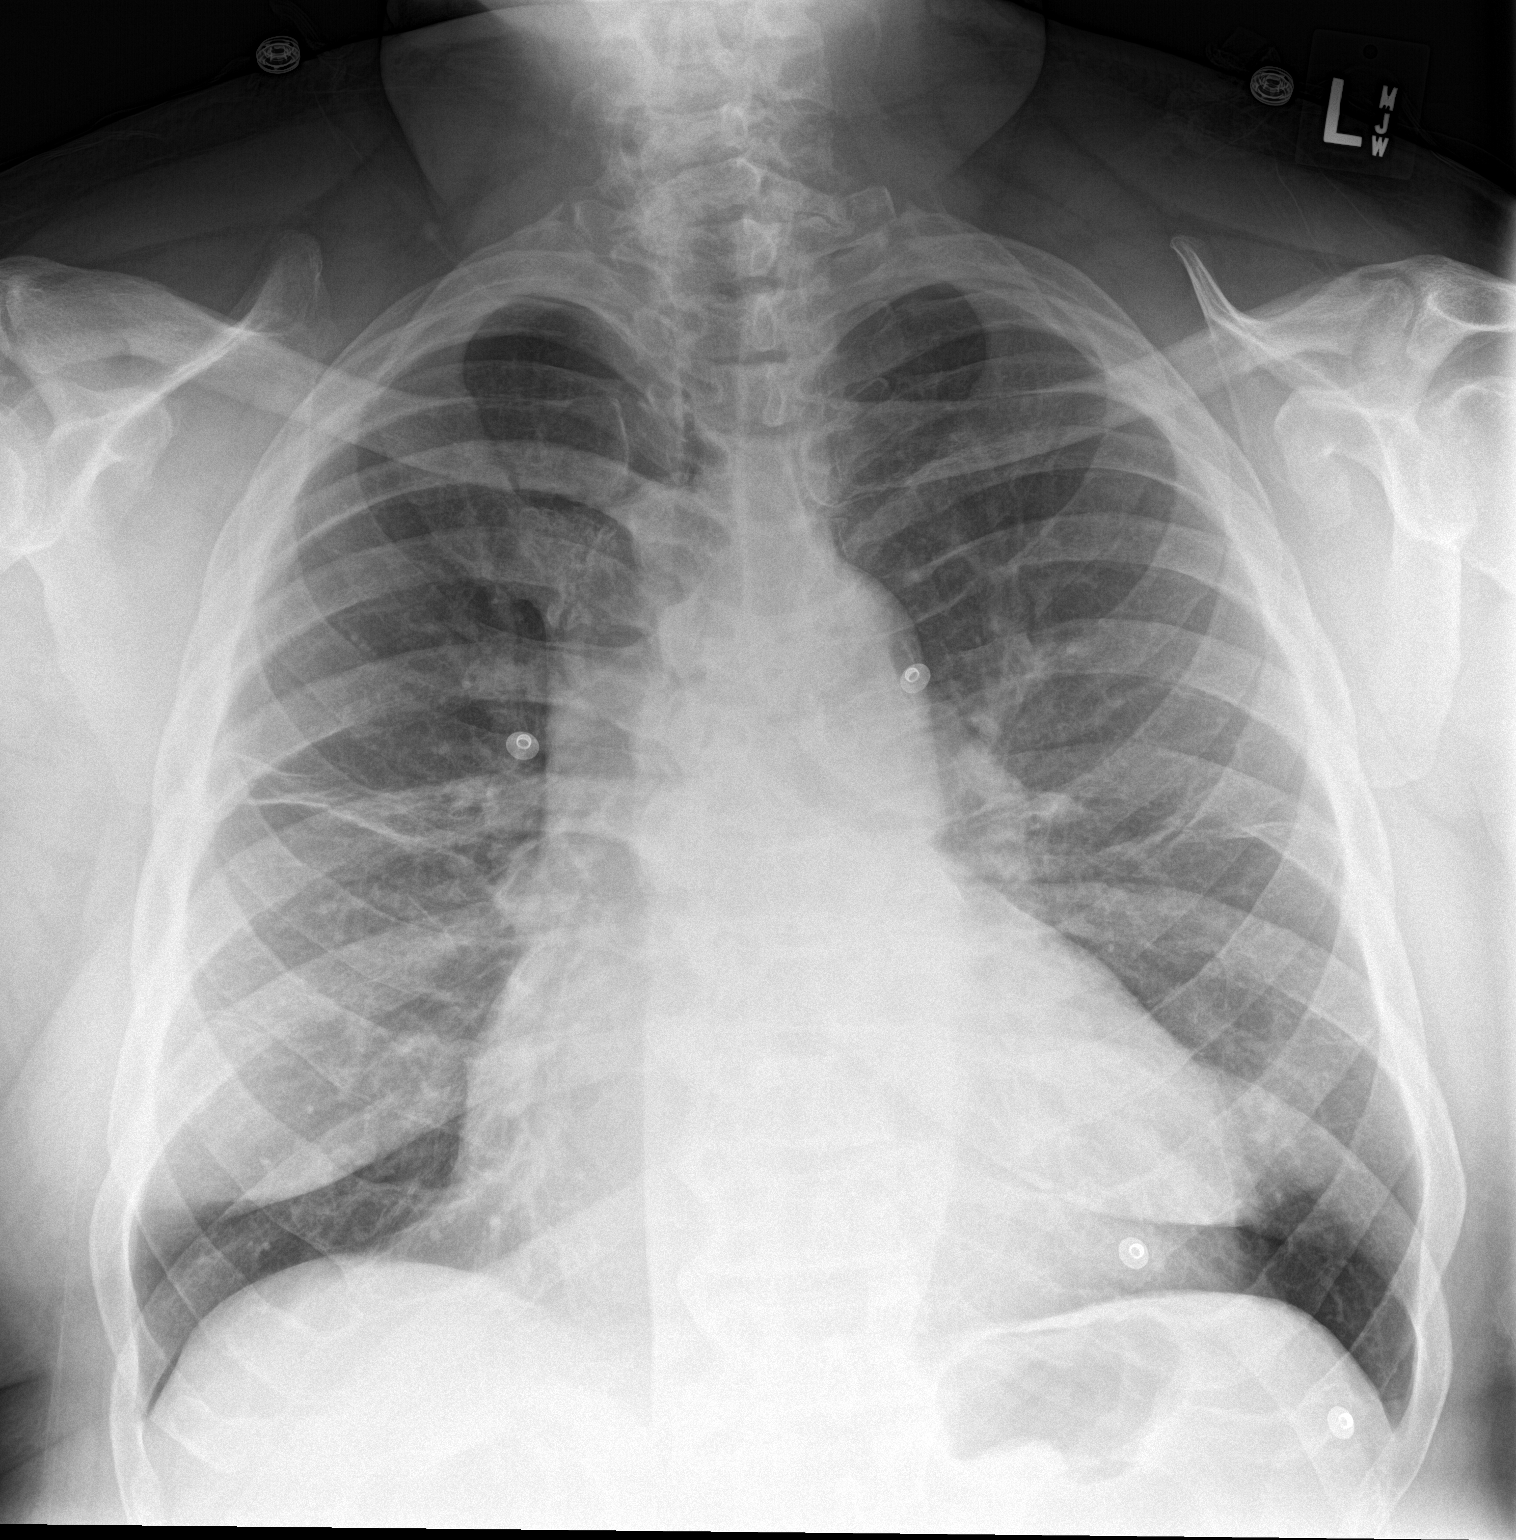

[chest lat]
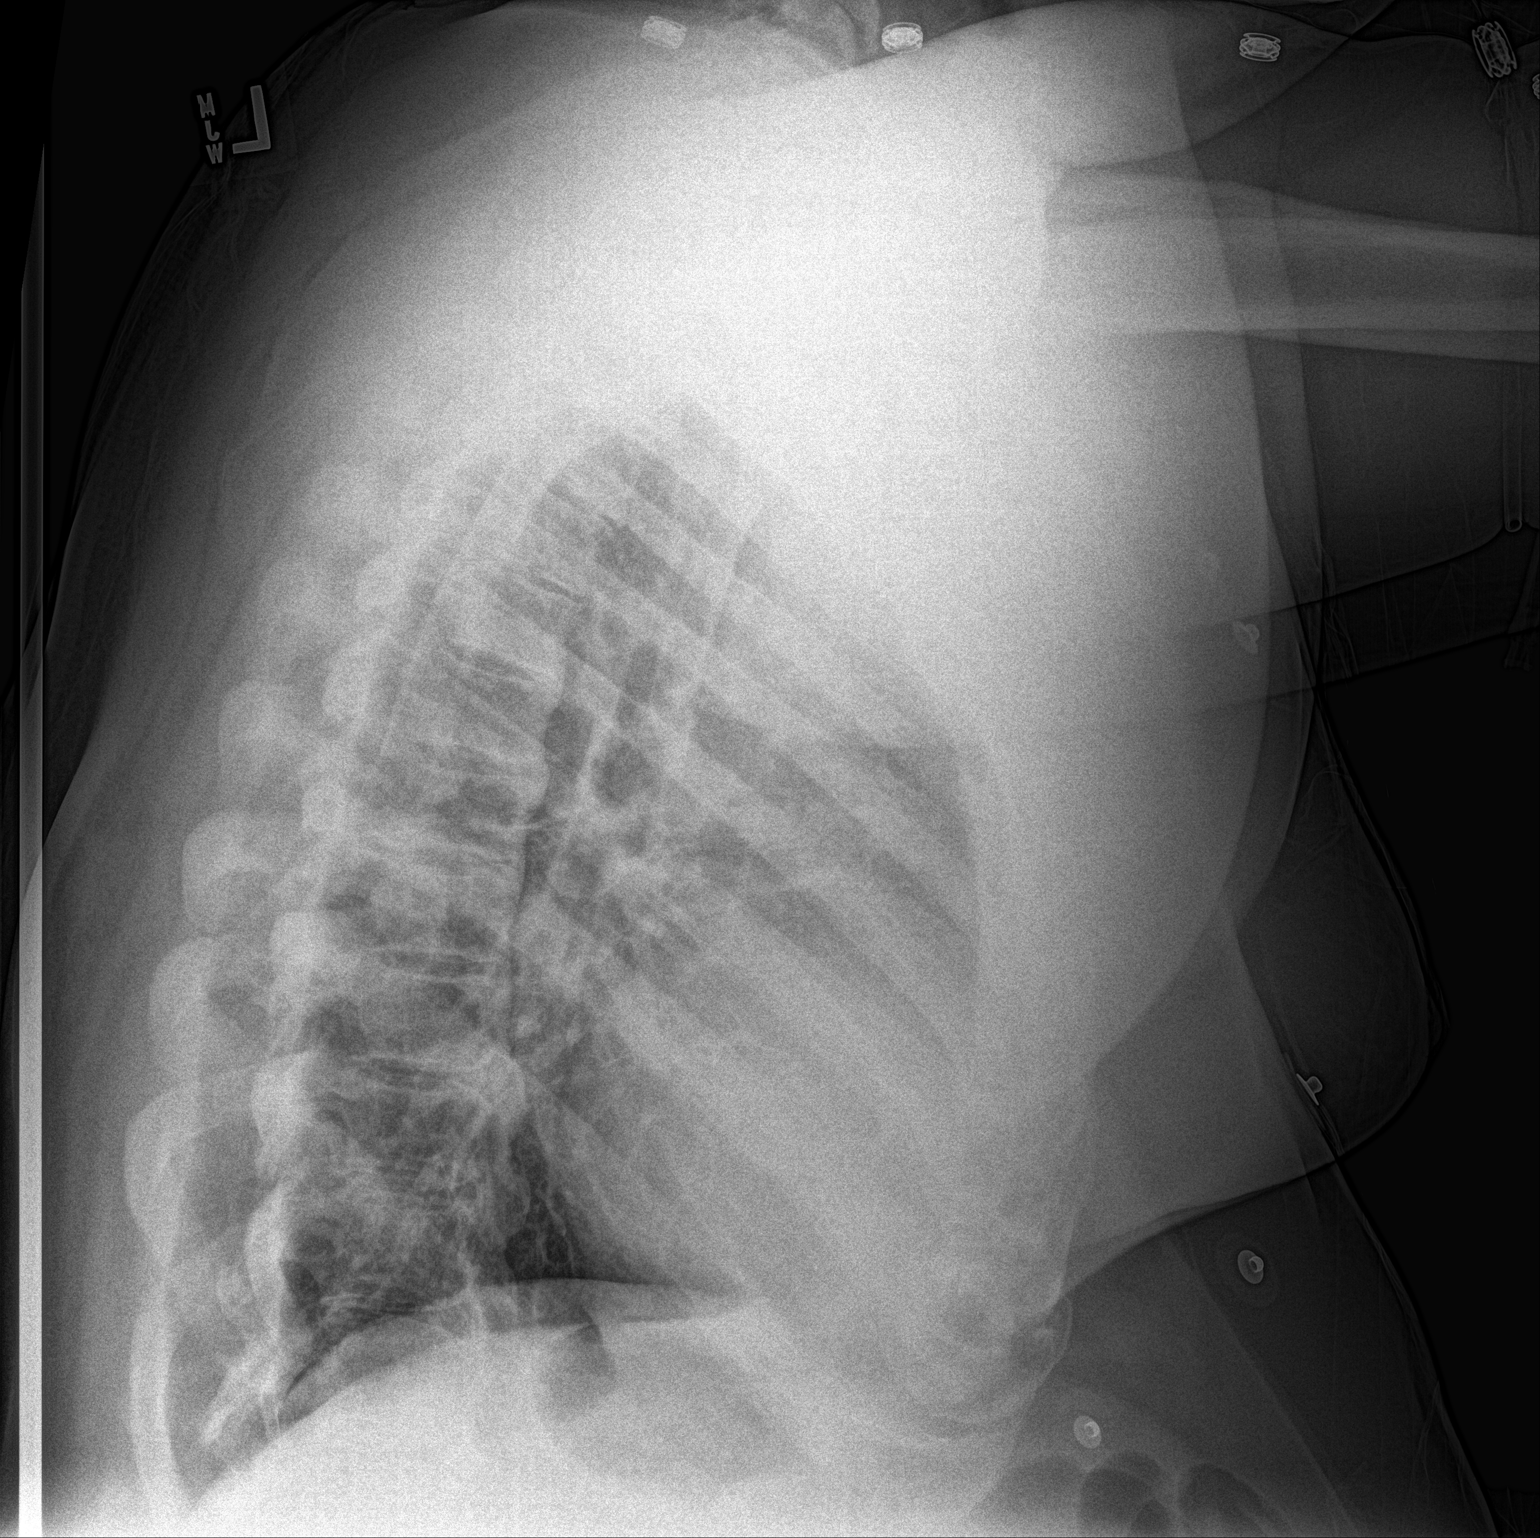

[2 of 2 positions shown; findings below may reference images not displayed]

FINDINGS: There is stable scarring in each mid lung region. There is no edema
or consolidation. Heart size and pulmonary vascularity are normal.
Known adenopathy. No bone lesions.
IMPRESSION: Scarring in each mid lung region.  No edema or consolidation.

## 2017-07-26 IMAGING — US US RENAL
1 series · 14 of 25 positions shown · non-contrast
Comparison: None.

CLINICAL DATA: Acute onset of renal insufficiency. Initial
encounter.

EXAM:
RENAL / URINARY TRACT ULTRASOUND COMPLETE

[Series 1: us renal · 0.29mm/px · 14 of 31 slices shown]
[im 1/31]
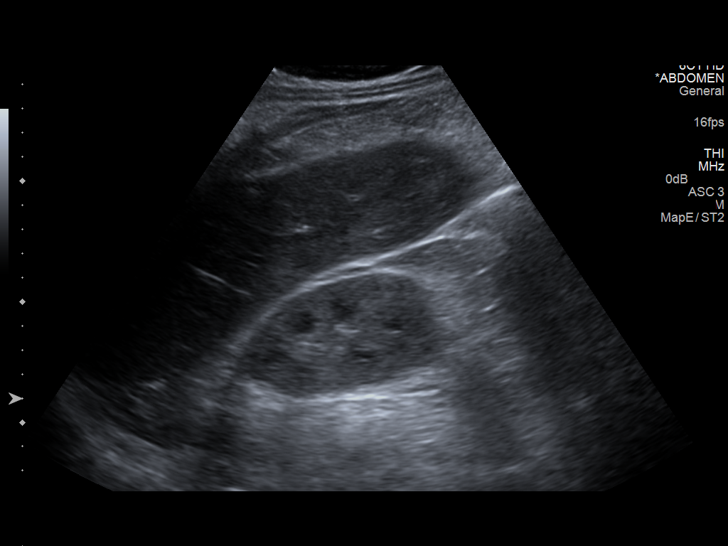
[im 3/31]
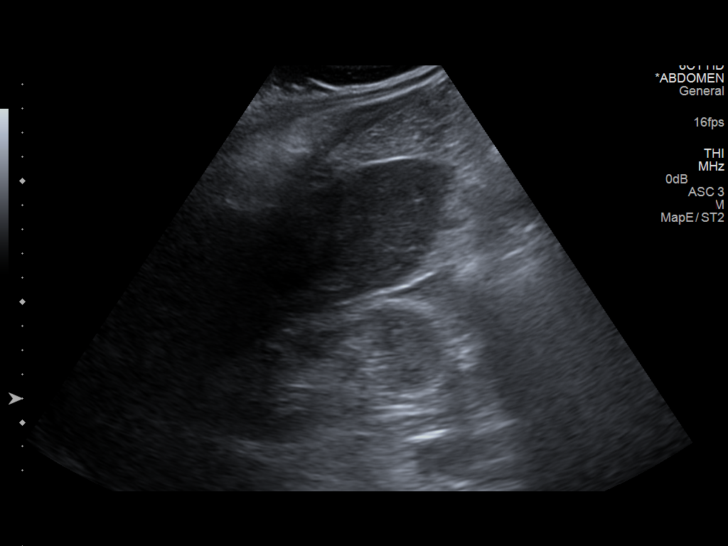
[im 6/31]
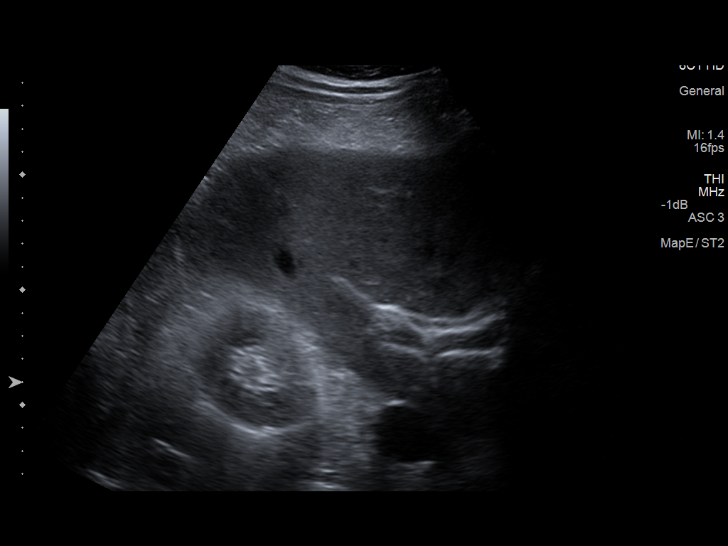
[im 8/31]
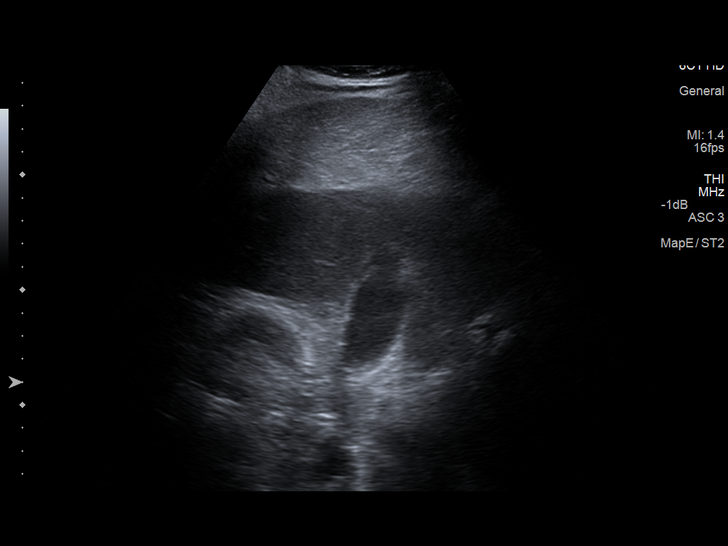
[im 11/31]
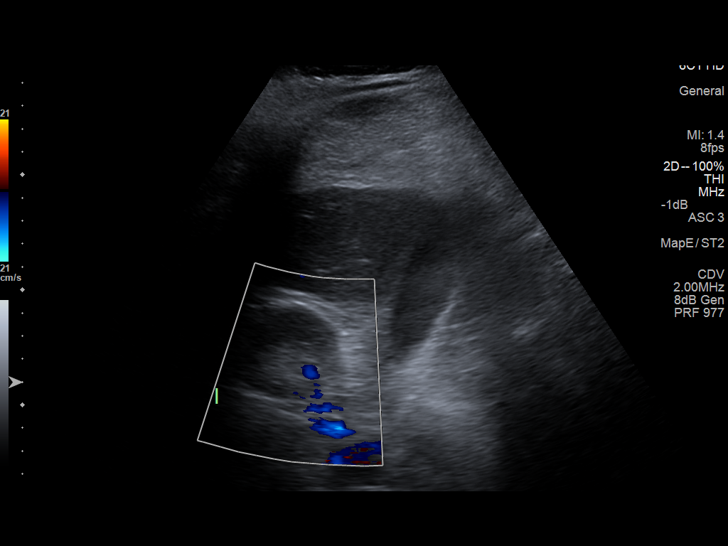
[im 12/31]
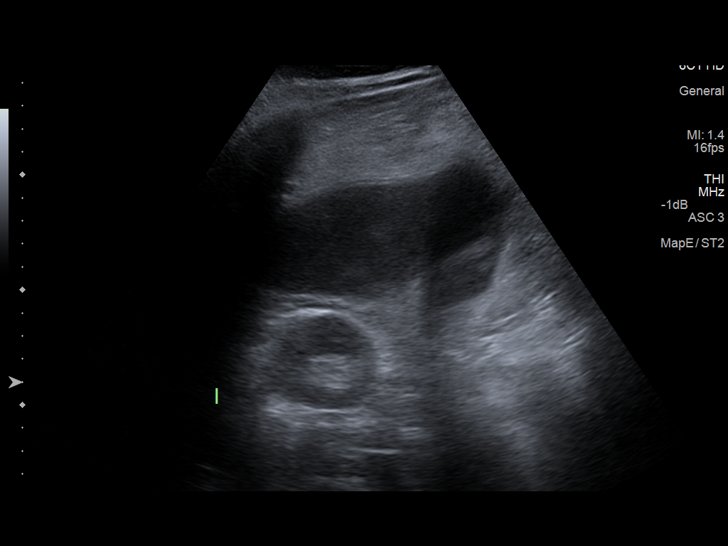
[im 14/31]
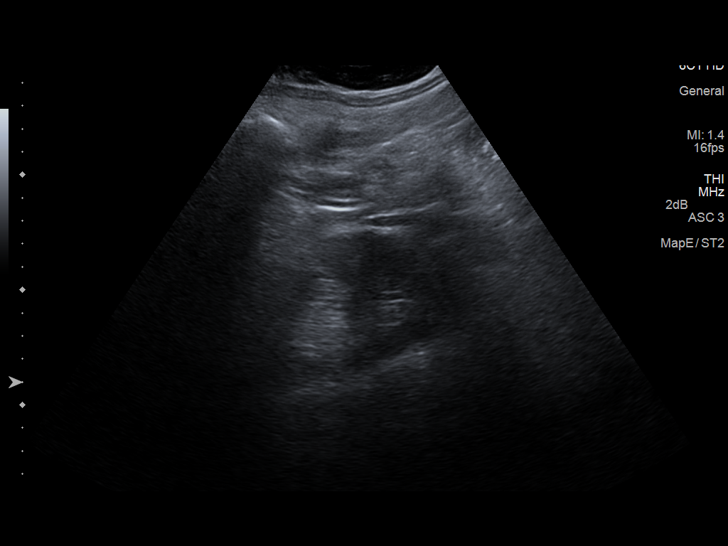
[im 17/31]
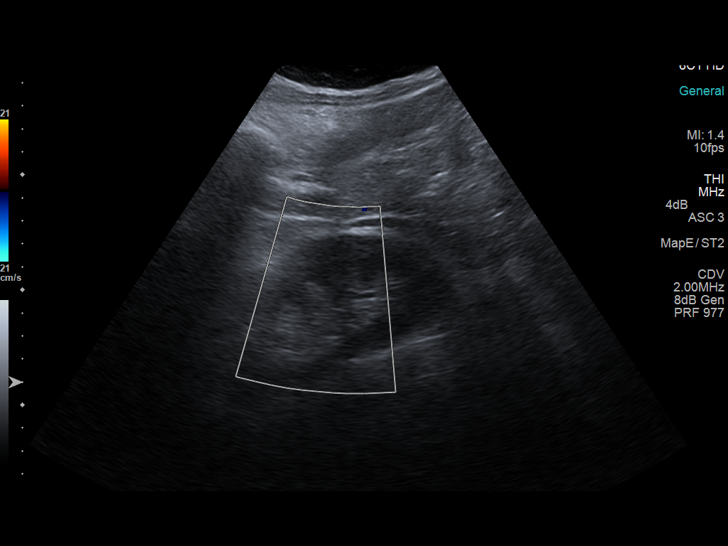
[im 19/31]
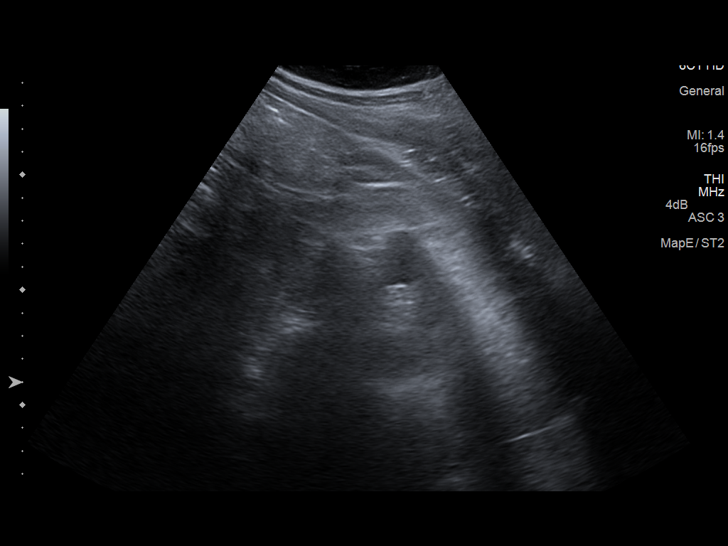
[im 21/31]
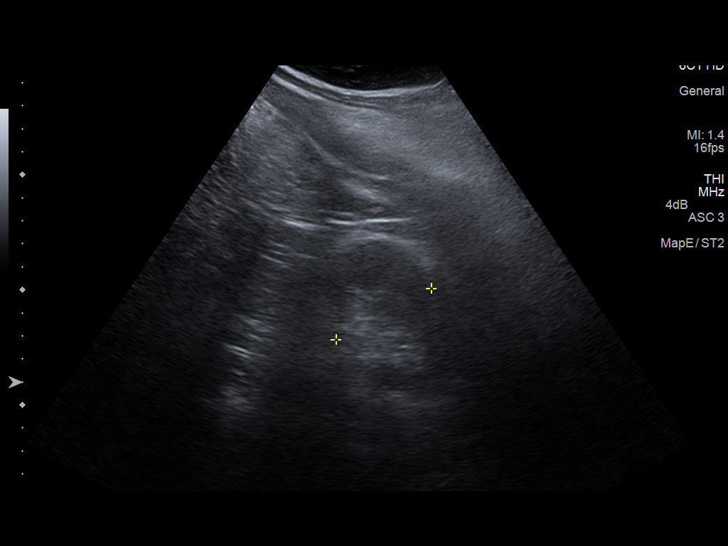
[im 23/31]
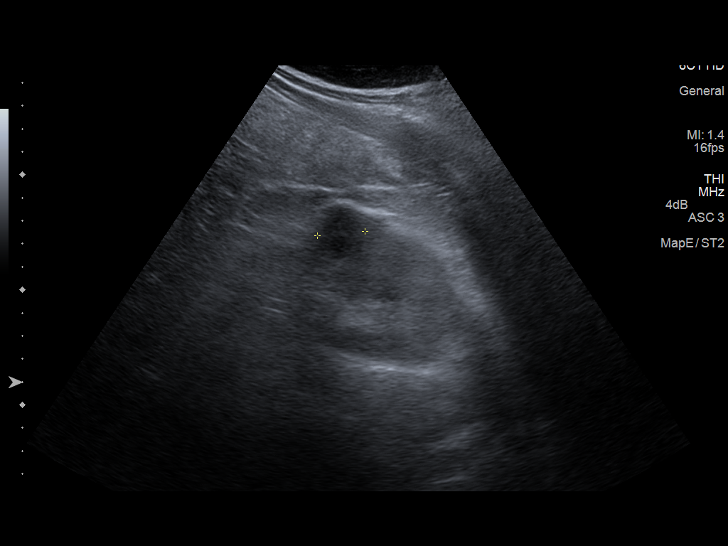
[im 26/31]
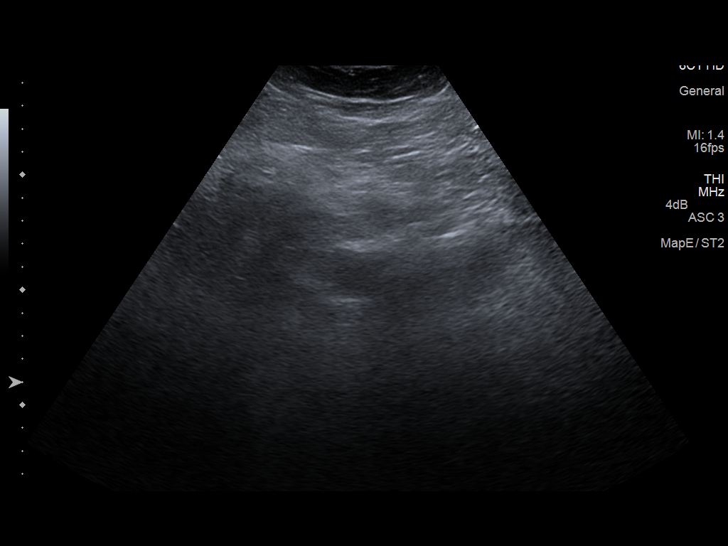
[im 28/31]
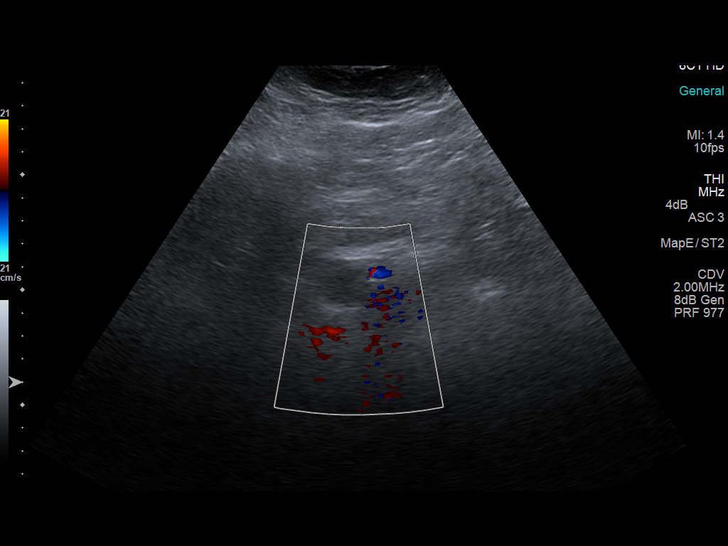
[im 31/31]
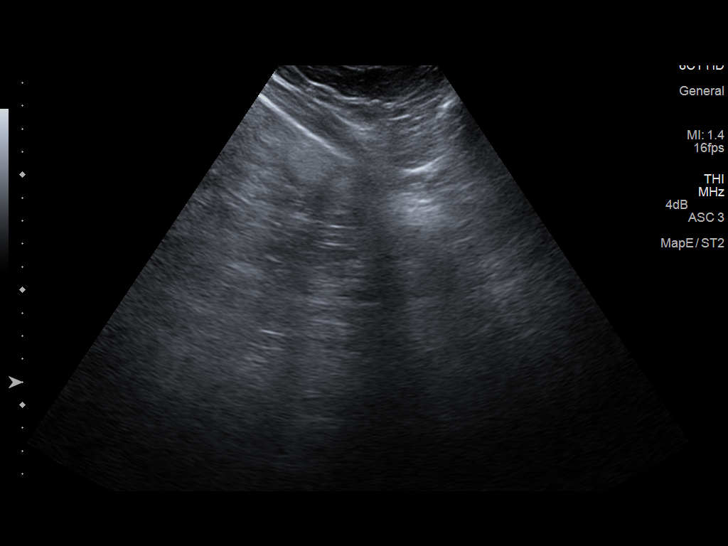

[14 of 25 positions shown; findings below may reference images not displayed]

FINDINGS: Right Kidney:

Length: 10.6 cm. Mildly increased parenchymal echogenicity noted. No
mass or hydronephrosis visualized.

Left Kidney:

Length: 9.7 cm. Mildly increased parenchymal echogenicity noted. No
hydronephrosis visualized. A 2.3 cm cyst is noted at the lower pole
of the left kidney.

Bladder:

Not visualized on this study.
IMPRESSION: 1. Mildly increased renal parenchymal echogenicity raises concern
for medical renal disease.
2. No evidence of hydronephrosis.
3. Small left renal cyst seen.
